# Patient Record
Sex: Male | Born: 1945 | Race: Black or African American | Hispanic: No | Smoking: Former smoker
Health system: Southern US, Community
[De-identification: ages and names within clinical notes are randomized; demographics above are authoritative.]

## PROBLEM LIST (undated history)

## (undated) DIAGNOSIS — I10 Essential (primary) hypertension: Secondary | ICD-10-CM

## (undated) DIAGNOSIS — Z992 Dependence on renal dialysis: Secondary | ICD-10-CM

## (undated) DIAGNOSIS — N189 Chronic kidney disease, unspecified: Secondary | ICD-10-CM

## (undated) DIAGNOSIS — A419 Sepsis, unspecified organism: Secondary | ICD-10-CM

## (undated) DIAGNOSIS — D631 Anemia in chronic kidney disease: Secondary | ICD-10-CM

## (undated) DIAGNOSIS — R6521 Severe sepsis with septic shock: Secondary | ICD-10-CM

## (undated) DIAGNOSIS — D649 Anemia, unspecified: Secondary | ICD-10-CM

## (undated) DIAGNOSIS — N186 End stage renal disease: Secondary | ICD-10-CM

## (undated) DIAGNOSIS — C801 Malignant (primary) neoplasm, unspecified: Secondary | ICD-10-CM

## (undated) HISTORY — DX: Anemia, unspecified: D64.9

## (undated) HISTORY — DX: Anemia in chronic kidney disease: D63.1

## (undated) HISTORY — DX: Chronic kidney disease, unspecified: N18.9

---

## 1987-07-14 HISTORY — PX: OTHER SURGICAL HISTORY: SHX169

## 1992-07-13 HISTORY — PX: SHOULDER SURGERY: SHX246

## 1996-07-13 HISTORY — PX: KNEE SURGERY: SHX244

## 2007-07-14 DIAGNOSIS — C801 Malignant (primary) neoplasm, unspecified: Secondary | ICD-10-CM

## 2007-07-14 HISTORY — DX: Malignant (primary) neoplasm, unspecified: C80.1

## 2007-09-19 ENCOUNTER — Ambulatory Visit: Admission: RE | Admit: 2007-09-19 | Discharge: 2007-12-18 | Payer: Self-pay | Admitting: Radiation Oncology

## 2008-07-24 ENCOUNTER — Emergency Department (HOSPITAL_COMMUNITY): Admission: EM | Admit: 2008-07-24 | Discharge: 2008-07-24 | Payer: Self-pay | Admitting: Emergency Medicine

## 2009-09-20 ENCOUNTER — Inpatient Hospital Stay (HOSPITAL_COMMUNITY): Admission: EM | Admit: 2009-09-20 | Discharge: 2009-09-22 | Payer: Self-pay | Admitting: Emergency Medicine

## 2010-08-02 ENCOUNTER — Emergency Department (HOSPITAL_COMMUNITY)
Admission: EM | Admit: 2010-08-02 | Discharge: 2010-08-02 | Payer: Self-pay | Source: Home / Self Care | Admitting: Emergency Medicine

## 2010-08-05 LAB — POCT I-STAT, CHEM 8
BUN: 22 mg/dL (ref 6–23)
Calcium, Ion: 1.19 mmol/L (ref 1.12–1.32)
Chloride: 109 mEq/L (ref 96–112)
Creatinine, Ser: 1.7 mg/dL — ABNORMAL HIGH (ref 0.4–1.5)
Glucose, Bld: 52 mg/dL — ABNORMAL LOW (ref 70–99)
HCT: 35 % — ABNORMAL LOW (ref 39.0–52.0)
Hemoglobin: 11.9 g/dL — ABNORMAL LOW (ref 13.0–17.0)
Potassium: 3.7 mEq/L (ref 3.5–5.1)
Sodium: 146 mEq/L — ABNORMAL HIGH (ref 135–145)
TCO2: 29 mmol/L (ref 0–100)

## 2010-08-05 LAB — URINE MICROSCOPIC-ADD ON

## 2010-08-05 LAB — URINALYSIS, ROUTINE W REFLEX MICROSCOPIC
Bilirubin Urine: NEGATIVE
Ketones, ur: NEGATIVE mg/dL
Leukocytes, UA: NEGATIVE
Nitrite: NEGATIVE
Protein, ur: >300 mg/dL — AB
Specific Gravity, Urine: 1.025 (ref 1.005–1.030)
Urine Glucose, Fasting: NEGATIVE mg/dL
Urobilinogen, UA: 0.2 mg/dL (ref 0.0–1.0)
pH: 6 (ref 5.0–8.0)

## 2010-08-05 LAB — GLUCOSE, CAPILLARY: Glucose-Capillary: 98 mg/dL (ref 70–99)

## 2010-10-06 LAB — URINALYSIS, ROUTINE W REFLEX MICROSCOPIC
Bilirubin Urine: NEGATIVE
Glucose, UA: NEGATIVE mg/dL
Ketones, ur: NEGATIVE mg/dL
Leukocytes, UA: NEGATIVE
Nitrite: NEGATIVE
Protein, ur: 30 mg/dL — AB
Specific Gravity, Urine: 1.02 (ref 1.005–1.030)
Urobilinogen, UA: 0.2 mg/dL (ref 0.0–1.0)
pH: 5.5 (ref 5.0–8.0)

## 2010-10-06 LAB — DIFFERENTIAL
Basophils Absolute: 0 10*3/uL (ref 0.0–0.1)
Basophils Absolute: 0 10*3/uL (ref 0.0–0.1)
Basophils Relative: 0 % (ref 0–1)
Basophils Relative: 0 % (ref 0–1)
Eosinophils Absolute: 0 10*3/uL (ref 0.0–0.7)
Eosinophils Absolute: 0.1 10*3/uL (ref 0.0–0.7)
Eosinophils Relative: 0 % (ref 0–5)
Eosinophils Relative: 2 % (ref 0–5)
Lymphocytes Relative: 12 % (ref 12–46)
Lymphocytes Relative: 12 % (ref 12–46)
Lymphs Abs: 0.7 10*3/uL (ref 0.7–4.0)
Lymphs Abs: 1 10*3/uL (ref 0.7–4.0)
Monocytes Absolute: 0.8 10*3/uL (ref 0.1–1.0)
Monocytes Absolute: 1 10*3/uL (ref 0.1–1.0)
Monocytes Relative: 17 % — ABNORMAL HIGH (ref 3–12)
Monocytes Relative: 9 % (ref 3–12)
Neutro Abs: 4.1 10*3/uL (ref 1.7–7.7)
Neutro Abs: 6.7 10*3/uL (ref 1.7–7.7)
Neutrophils Relative %: 69 % (ref 43–77)
Neutrophils Relative %: 79 % — ABNORMAL HIGH (ref 43–77)

## 2010-10-06 LAB — COMPREHENSIVE METABOLIC PANEL
ALT: 20 U/L (ref 0–53)
ALT: 24 U/L (ref 0–53)
AST: 23 U/L (ref 0–37)
AST: 24 U/L (ref 0–37)
Albumin: 2.7 g/dL — ABNORMAL LOW (ref 3.5–5.2)
Albumin: 2.9 g/dL — ABNORMAL LOW (ref 3.5–5.2)
Alkaline Phosphatase: 53 U/L (ref 39–117)
Alkaline Phosphatase: 61 U/L (ref 39–117)
BUN: 33 mg/dL — ABNORMAL HIGH (ref 6–23)
BUN: 70 mg/dL — ABNORMAL HIGH (ref 6–23)
CO2: 24 mEq/L (ref 19–32)
CO2: 24 mEq/L (ref 19–32)
Calcium: 8.5 mg/dL (ref 8.4–10.5)
Calcium: 8.8 mg/dL (ref 8.4–10.5)
Chloride: 108 mEq/L (ref 96–112)
Chloride: 109 mEq/L (ref 96–112)
Creatinine, Ser: 1.58 mg/dL — ABNORMAL HIGH (ref 0.4–1.5)
Creatinine, Ser: 3.28 mg/dL — ABNORMAL HIGH (ref 0.4–1.5)
GFR calc Af Amer: 23 mL/min — ABNORMAL LOW (ref >60)
GFR calc Af Amer: 54 mL/min — ABNORMAL LOW (ref >60)
GFR calc non Af Amer: 19 mL/min — ABNORMAL LOW (ref >60)
GFR calc non Af Amer: 44 mL/min — ABNORMAL LOW (ref >60)
Glucose, Bld: 216 mg/dL — ABNORMAL HIGH (ref 70–99)
Glucose, Bld: 259 mg/dL — ABNORMAL HIGH (ref 70–99)
Potassium: 3.7 mEq/L (ref 3.5–5.1)
Potassium: 3.8 mEq/L (ref 3.5–5.1)
Sodium: 140 mEq/L (ref 135–145)
Sodium: 140 mEq/L (ref 135–145)
Total Bilirubin: 0.4 mg/dL (ref 0.3–1.2)
Total Bilirubin: 0.4 mg/dL (ref 0.3–1.2)
Total Protein: 6.5 g/dL (ref 6.0–8.3)
Total Protein: 6.8 g/dL (ref 6.0–8.3)

## 2010-10-06 LAB — CBC
HCT: 31.1 % — ABNORMAL LOW (ref 39.0–52.0)
HCT: 33.7 % — ABNORMAL LOW (ref 39.0–52.0)
Hemoglobin: 10.6 g/dL — ABNORMAL LOW (ref 13.0–17.0)
Hemoglobin: 11.5 g/dL — ABNORMAL LOW (ref 13.0–17.0)
MCHC: 34.1 g/dL (ref 30.0–36.0)
MCHC: 34.1 g/dL (ref 30.0–36.0)
MCV: 89.9 fL (ref 78.0–100.0)
MCV: 90.6 fL (ref 78.0–100.0)
Platelets: 195 10*3/uL (ref 150–400)
Platelets: 196 10*3/uL (ref 150–400)
RBC: 3.46 MIL/uL — ABNORMAL LOW (ref 4.22–5.81)
RBC: 3.72 MIL/uL — ABNORMAL LOW (ref 4.22–5.81)
RDW: 14.5 % (ref 11.5–15.5)
RDW: 14.5 % (ref 11.5–15.5)
WBC: 5.9 10*3/uL (ref 4.0–10.5)
WBC: 8.5 10*3/uL (ref 4.0–10.5)

## 2010-10-06 LAB — BASIC METABOLIC PANEL
BUN: 52 mg/dL — ABNORMAL HIGH (ref 6–23)
CO2: 21 mEq/L (ref 19–32)
Calcium: 8.6 mg/dL (ref 8.4–10.5)
Chloride: 107 mEq/L (ref 96–112)
Creatinine, Ser: 2.19 mg/dL — ABNORMAL HIGH (ref 0.4–1.5)
GFR calc Af Amer: 37 mL/min — ABNORMAL LOW (ref >60)
GFR calc non Af Amer: 30 mL/min — ABNORMAL LOW (ref >60)
Glucose, Bld: 321 mg/dL — ABNORMAL HIGH (ref 70–99)
Potassium: 3.8 mEq/L (ref 3.5–5.1)
Sodium: 138 mEq/L (ref 135–145)

## 2010-10-06 LAB — RHEUMATOID FACTOR: Rhuematoid fact SerPl-aCnc: 20 IU/mL (ref 0–20)

## 2010-10-06 LAB — URINE MICROSCOPIC-ADD ON

## 2010-10-06 LAB — LIPID PANEL
Cholesterol: 104 mg/dL (ref 0–200)
HDL: 29 mg/dL — ABNORMAL LOW (ref >39)
LDL Cholesterol: 51 mg/dL (ref 0–99)
Total CHOL/HDL Ratio: 3.6 RATIO
Triglycerides: 119 mg/dL (ref <150)
VLDL: 24 mg/dL (ref 0–40)

## 2010-10-06 LAB — GLUCOSE, CAPILLARY
Glucose-Capillary: 173 mg/dL — ABNORMAL HIGH (ref 70–99)
Glucose-Capillary: 188 mg/dL — ABNORMAL HIGH (ref 70–99)
Glucose-Capillary: 205 mg/dL — ABNORMAL HIGH (ref 70–99)
Glucose-Capillary: 229 mg/dL — ABNORMAL HIGH (ref 70–99)
Glucose-Capillary: 273 mg/dL — ABNORMAL HIGH (ref 70–99)
Glucose-Capillary: 286 mg/dL — ABNORMAL HIGH (ref 70–99)
Glucose-Capillary: 301 mg/dL — ABNORMAL HIGH (ref 70–99)
Glucose-Capillary: 319 mg/dL — ABNORMAL HIGH (ref 70–99)
Glucose-Capillary: 319 mg/dL — ABNORMAL HIGH (ref 70–99)

## 2010-10-06 LAB — T4, FREE: Free T4: 1.18 ng/dL (ref 0.80–1.80)

## 2010-10-06 LAB — ANA: Anti Nuclear Antibody(ANA): POSITIVE — AB

## 2010-10-06 LAB — ANTI-NUCLEAR AB-TITER (ANA TITER): ANA Titer 1: NEGATIVE

## 2010-10-06 LAB — TSH
TSH: 0.199 u[IU]/mL — ABNORMAL LOW (ref 0.350–4.500)
TSH: 0.736 u[IU]/mL (ref 0.350–4.500)

## 2010-10-06 LAB — SEDIMENTATION RATE: Sed Rate: 40 mm/hr — ABNORMAL HIGH (ref 0–16)

## 2010-10-06 LAB — PHOSPHORUS: Phosphorus: 3.3 mg/dL (ref 2.3–4.6)

## 2010-10-06 LAB — MAGNESIUM: Magnesium: 2.1 mg/dL (ref 1.5–2.5)

## 2010-10-06 LAB — CK: Total CK: 119 U/L (ref 7–232)

## 2010-10-06 LAB — HEMOGLOBIN A1C
Hgb A1c MFr Bld: 6.8 % — ABNORMAL HIGH (ref 4.6–6.1)
Mean Plasma Glucose: 148 mg/dL

## 2011-07-10 ENCOUNTER — Emergency Department (HOSPITAL_COMMUNITY): Payer: Medicare Other

## 2011-07-10 ENCOUNTER — Inpatient Hospital Stay (HOSPITAL_COMMUNITY)
Admission: EM | Admit: 2011-07-10 | Discharge: 2011-07-11 | DRG: 125 | Disposition: A | Discharge status: Home / Self Care | Payer: Medicare Other | Attending: Internal Medicine | Admitting: Internal Medicine

## 2011-07-10 ENCOUNTER — Other Ambulatory Visit: Payer: Self-pay

## 2011-07-10 ENCOUNTER — Encounter: Payer: Self-pay | Admitting: Emergency Medicine

## 2011-07-10 DIAGNOSIS — Z8546 Personal history of malignant neoplasm of prostate: Secondary | ICD-10-CM

## 2011-07-10 DIAGNOSIS — H538 Other visual disturbances: Principal | ICD-10-CM | POA: Diagnosis present

## 2011-07-10 DIAGNOSIS — I10 Essential (primary) hypertension: Secondary | ICD-10-CM | POA: Diagnosis present

## 2011-07-10 DIAGNOSIS — N189 Chronic kidney disease, unspecified: Secondary | ICD-10-CM | POA: Diagnosis present

## 2011-07-10 DIAGNOSIS — M109 Gout, unspecified: Secondary | ICD-10-CM | POA: Diagnosis present

## 2011-07-10 DIAGNOSIS — E119 Type 2 diabetes mellitus without complications: Secondary | ICD-10-CM | POA: Diagnosis present

## 2011-07-10 DIAGNOSIS — I129 Hypertensive chronic kidney disease with stage 1 through stage 4 chronic kidney disease, or unspecified chronic kidney disease: Secondary | ICD-10-CM | POA: Diagnosis present

## 2011-07-10 DIAGNOSIS — I639 Cerebral infarction, unspecified: Secondary | ICD-10-CM

## 2011-07-10 DIAGNOSIS — Z794 Long term (current) use of insulin: Secondary | ICD-10-CM

## 2011-07-10 HISTORY — DX: Essential (primary) hypertension: I10

## 2011-07-10 HISTORY — DX: Malignant (primary) neoplasm, unspecified: C80.1

## 2011-07-10 LAB — DIFFERENTIAL
Basophils Absolute: 0.1 10*3/uL (ref 0.0–0.1)
Basophils Relative: 1 % (ref 0–1)
Eosinophils Absolute: 0.1 10*3/uL (ref 0.0–0.7)
Eosinophils Relative: 2 % (ref 0–5)
Lymphocytes Relative: 26 % (ref 12–46)
Lymphs Abs: 1.8 10*3/uL (ref 0.7–4.0)
Monocytes Absolute: 0.5 10*3/uL (ref 0.1–1.0)
Monocytes Relative: 7 % (ref 3–12)
Neutro Abs: 4.2 10*3/uL (ref 1.7–7.7)
Neutrophils Relative %: 64 % (ref 43–77)

## 2011-07-10 LAB — CBC
HCT: 33.5 % — ABNORMAL LOW (ref 39.0–52.0)
Hemoglobin: 10.6 g/dL — ABNORMAL LOW (ref 13.0–17.0)
MCH: 29.9 pg (ref 26.0–34.0)
MCHC: 31.6 g/dL (ref 30.0–36.0)
MCV: 94.4 fL (ref 78.0–100.0)
Platelets: 238 10*3/uL (ref 150–400)
RBC: 3.55 MIL/uL — ABNORMAL LOW (ref 4.22–5.81)
RDW: 13.1 % (ref 11.5–15.5)
WBC: 6.7 10*3/uL (ref 4.0–10.5)

## 2011-07-10 LAB — COMPREHENSIVE METABOLIC PANEL
ALT: 11 U/L (ref 0–53)
AST: 16 U/L (ref 0–37)
Albumin: 2.9 g/dL — ABNORMAL LOW (ref 3.5–5.2)
Alkaline Phosphatase: 89 U/L (ref 39–117)
BUN: 22 mg/dL (ref 6–23)
CO2: 29 mEq/L (ref 19–32)
Calcium: 9.4 mg/dL (ref 8.4–10.5)
Chloride: 106 mEq/L (ref 96–112)
Creatinine, Ser: 2.13 mg/dL — ABNORMAL HIGH (ref 0.50–1.35)
GFR calc Af Amer: 35 mL/min — ABNORMAL LOW (ref >90)
GFR calc non Af Amer: 30 mL/min — ABNORMAL LOW (ref >90)
Glucose, Bld: 150 mg/dL — ABNORMAL HIGH (ref 70–99)
Potassium: 3.7 mEq/L (ref 3.5–5.1)
Sodium: 142 mEq/L (ref 135–145)
Total Bilirubin: 0.2 mg/dL — ABNORMAL LOW (ref 0.3–1.2)
Total Protein: 7.1 g/dL (ref 6.0–8.3)

## 2011-07-10 LAB — GLUCOSE, CAPILLARY: Glucose-Capillary: 111 mg/dL — ABNORMAL HIGH (ref 70–99)

## 2011-07-10 MED ORDER — ALLOPURINOL 300 MG PO TABS
300.0000 mg | ORAL_TABLET | Freq: Every day | ORAL | Status: DC
  Administered 2011-07-11: 300 mg via ORAL
  Filled 2011-07-10 (×3): qty 1

## 2011-07-10 MED ORDER — INSULIN GLARGINE 100 UNIT/ML ~~LOC~~ SOLN
20.0000 [IU] | Freq: Every day | SUBCUTANEOUS | Status: DC
  Administered 2011-07-10: 20 [IU] via SUBCUTANEOUS
  Filled 2011-07-10: qty 3

## 2011-07-10 MED ORDER — INSULIN ASPART 100 UNIT/ML ~~LOC~~ SOLN: 0.0000 [IU] | Freq: Three times a day (TID) | SUBCUTANEOUS | Status: DC

## 2011-07-10 MED ORDER — ACETAMINOPHEN 325 MG PO TABS: 650.0000 mg | ORAL_TABLET | Freq: Four times a day (QID) | ORAL | Status: DC | PRN

## 2011-07-10 MED ORDER — ACETAMINOPHEN 650 MG RE SUPP: 650.0000 mg | Freq: Four times a day (QID) | RECTAL | Status: DC | PRN

## 2011-07-10 MED ORDER — ZOLPIDEM TARTRATE 5 MG PO TABS: 5.0000 mg | ORAL_TABLET | Freq: Every evening | ORAL | Status: DC | PRN

## 2011-07-10 MED ORDER — GLIPIZIDE 5 MG PO TABS
5.0000 mg | ORAL_TABLET | Freq: Two times a day (BID) | ORAL | Status: DC
  Administered 2011-07-11: 5 mg via ORAL
  Filled 2011-07-10 (×4): qty 1

## 2011-07-10 MED ORDER — LOSARTAN POTASSIUM 50 MG PO TABS
100.0000 mg | ORAL_TABLET | Freq: Every day | ORAL | Status: DC
  Administered 2011-07-11: 100 mg via ORAL
  Filled 2011-07-10 (×3): qty 2

## 2011-07-10 MED ORDER — ONDANSETRON HCL 4 MG PO TABS: 4.0000 mg | ORAL_TABLET | Freq: Four times a day (QID) | ORAL | Status: DC | PRN

## 2011-07-10 MED ORDER — DOXAZOSIN MESYLATE 8 MG PO TABS
8.0000 mg | ORAL_TABLET | Freq: Every day | ORAL | Status: DC
  Administered 2011-07-10: 8 mg via ORAL
  Filled 2011-07-10 (×3): qty 1

## 2011-07-10 MED ORDER — PANTOPRAZOLE SODIUM 40 MG PO TBEC
40.0000 mg | DELAYED_RELEASE_TABLET | Freq: Every day | ORAL | Status: DC
  Administered 2011-07-11: 40 mg via ORAL
  Filled 2011-07-10: qty 1

## 2011-07-10 MED ORDER — SODIUM CHLORIDE 0.9 % IV SOLN
Freq: Once | INTRAVENOUS | Status: AC
  Administered 2011-07-10: 17:00:00 via INTRAVENOUS

## 2011-07-10 MED ORDER — INSULIN ASPART 100 UNIT/ML ~~LOC~~ SOLN: 0.0000 [IU] | Freq: Every day | SUBCUTANEOUS | Status: DC

## 2011-07-10 MED ORDER — AMLODIPINE BESYLATE 5 MG PO TABS
10.0000 mg | ORAL_TABLET | Freq: Every day | ORAL | Status: DC
  Administered 2011-07-11: 10 mg via ORAL
  Filled 2011-07-10: qty 2

## 2011-07-10 MED ORDER — ONDANSETRON HCL 4 MG/2ML IJ SOLN: 4.0000 mg | Freq: Four times a day (QID) | INTRAMUSCULAR | Status: DC | PRN

## 2011-07-10 MED ORDER — CARVEDILOL 12.5 MG PO TABS
25.0000 mg | ORAL_TABLET | Freq: Two times a day (BID) | ORAL | Status: DC
  Administered 2011-07-11: 25 mg via ORAL
  Filled 2011-07-10 (×5): qty 2

## 2011-07-10 MED ORDER — POTASSIUM CHLORIDE CRYS ER 10 MEQ PO TBCR: 10.0000 meq | EXTENDED_RELEASE_TABLET | Freq: Once | ORAL | Status: DC

## 2011-07-10 MED ORDER — LORAZEPAM 2 MG/ML IJ SOLN
1.0000 mg | Freq: Once | INTRAMUSCULAR | Status: AC
  Administered 2011-07-10: 1 mg via INTRAVENOUS
  Filled 2011-07-10: qty 1

## 2011-07-10 NOTE — ED Notes (Signed)
Pt states that he cannot see out of his left eye. States that he woke up this am with blurred vision and spots in his left eye. Denies drainage or injury.

## 2011-07-10 NOTE — ED Notes (Signed)
Pt c/o left eye irritated and watery upon waking this am.

## 2011-07-10 NOTE — ED Notes (Signed)
Returned from MRI at [place], waiting to give report to the floor

## 2011-07-10 NOTE — H&P (Signed)
Justin [name] MRN: [id number] DOB/AGE: 09/20/1945 65 y.o.  Admit date: 07/10/2011 Chief Complaint: Blurred vision in the left eye. HPI: This 65 year old man woke up this morning at approximately 9 AM and had sudden onset of blurring of the left eye, progressing to blindness for a few hours. Now, the vision in the left eye is better, but still blurred. He did not have any speech difficulties or any limb weakness. His mentation has been clear and without confusion. He has a history of diabetes, hypertension and hyperlipidemia per his own admission. At    Past medical history: 1. Diabetes, on insulin. 2. Hypertension. 3. Gout. 4. History prostate cancer, with radiation seeds. 5. Chronic kidney disease.  Past surgical history: 1. Shoulder surgery. 2. Knee surgery.      Family history: Noncontributory.  Social history: He is a widower and lives alone. He does not smoke cigarettes and does not drink alcohol.  Allergies: No Known Allergies  Medications Prior to Admission  Medication Dose Route Frequency Provider Last Rate Last Dose  . 0.9 %  sodium chloride infusion   Intravenous Once Duaine Dredge, PA 20 mL/hr at 07/10/11 1700    . acetaminophen (TYLENOL) tablet 650 mg  650 mg Oral Q6H PRN Nimish C Gosrani       Or  . acetaminophen (TYLENOL) suppository 650 mg  650 mg Rectal Q6H PRN Nimish C Gosrani      . allopurinol (ZYLOPRIM) tablet 300 mg  300 mg Oral Daily Nimish C Gosrani      . amLODipine (NORVASC) tablet 10 mg  10 mg Oral Daily Nimish C Gosrani      . carvedilol (COREG) tablet 25 mg  25 mg Oral BID WC Nimish C Gosrani      . doxazosin (CARDURA) tablet 8 mg  8 mg Oral QHS Nimish C Gosrani      . glipiZIDE (GLUCOTROL) tablet 5 mg  5 mg Oral BID AC Nimish C Gosrani      . insulin aspart (novoLOG) injection 0-15 Units  0-15 Units Subcutaneous TID WC Nimish C Gosrani      . insulin aspart (novoLOG) injection 0-5 Units  0-5 Units Subcutaneous QHS Nimish C Gosrani      .  insulin glargine (LANTUS) injection 20 Units  20 Units Subcutaneous QHS Nimish C Gosrani      . LORazepam (ATIVAN) injection 1 mg  1 mg Intravenous Once Sharyon Cable, MD   1 mg at 07/10/11 1722  . losartan (COZAAR) tablet 100 mg  100 mg Oral Daily Nimish C Gosrani      . ondansetron (ZOFRAN) tablet 4 mg  4 mg Oral Q6H PRN Nimish C Gosrani       Or  . ondansetron (ZOFRAN) injection 4 mg  4 mg Intravenous Q6H PRN Nimish C Gosrani      . pantoprazole (PROTONIX) EC tablet 40 mg  40 mg Oral Q1200 Nimish C Gosrani      . potassium chloride SA (K-DUR,KLOR-CON) CR tablet 10 mEq  10 mEq Oral Once Nimish C Gosrani      . zolpidem (AMBIEN) tablet 5 mg  5 mg Oral QHS PRN Nimish C Gosrani       No current outpatient prescriptions on file as of 07/10/2011.       [id number] from the symptoms mentioned above,there are no other symptoms referable to all systems reviewed.  Physical Exam: Blood pressure 164/90, pulse 74, temperature 97.9 F (36.6 C), temperature source Oral, resp. rate  18, SpO2 100.00%. He looks systemically well. He is alert and orientated. There is no facial asymmetries. There is no limb weakness. External ocular movements of both eyes are normal. There is no nystagmus. Pupils are equal and reactive bilaterally. Funduscopy is difficult in the emergency room and his pupil is not dilated. However, there appears to be a red light reflex in both eyes. There is no carotid bruit. Heart sounds are present with a soft systolic murmur does not radiate to the carotids. Lung fields are clear. Abdomen is soft and nontender. There is no hepatosplenomegaly. There are no masses.    Basename 07/10/11 1550  WBC 6.7  NEUTROABS 4.2  HGB 10.6*  HCT 33.5*  MCV 94.4  PLT 238    Basename 07/10/11 1550  NA 142  K 3.7  CL 106  CO2 29  GLUCOSE 150*  BUN 22  CREATININE 2.13*  CALCIUM 9.4  MG --      No results found for this or any previous visit (from the past 240 hour(s)).   Ct Head Wo  Contrast  07/10/2011  *RADIOLOGY REPORT*  Clinical Data: Fusion changes, left eye.  CT HEAD WITHOUT CONTRAST  Technique:  Contiguous axial images were obtained from the base of the skull through the vertex without contrast.  Comparison: None.  Findings: Focal hypodensity within the right corona radiata. Periventricular and subcortical white matter hypodensities are most in keeping with chronic microangiopathic change. There is no evidence for acute hemorrhage, hydrocephalus, mass lesion, or abnormal extra-axial fluid collection. The visualized paranasal sinuses and mastoid air cells are predominately clear. Convex high attenuation superficial to the left occipital bone, may reflect prior trauma/hematoma.  The globes are symmetric.  The lenses are located.  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  Discussed via telephone with Dr. Christy Gentles at 04:25 p.m. on 07/10/2011.  Original Report Authenticated By: Suanne Marker, M.D.   Impression: 1. Blurred vision in the left eye, unclear etiology. 2. Hypertension. 3. Diabetes mellitus. 4. Chronic kidney disease.     Plan: 1. Admit to telemetry. 2. Agree with MRI of the brain. 3. Consider ophthalmological consultation.      Doree Albee Pager [phone]  07/10/2011, 5:37 PM

## 2011-07-10 NOTE — ED Provider Notes (Addendum)
Pt seen with PA Reports "curtain" over left eye He is unsure when this started No other complaints Could be CVA tPA in stroke considered but not given due to:  Onset over 3-4.5hours (unclear time of onset as he awoke with symptoms)  Pt has chronic ptosis in OS that is not new per patient     Sharyon Cable, MD 07/10/11 1555   Date: 07/10/2011  Rate: 66  Rhythm: normal sinus rhythm  QRS Axis: normal  Intervals: normal  ST/T Wave abnormalities: nonspecific ST changes  Conduction Disutrbances:none      Sharyon Cable, MD 07/10/11 1656

## 2011-07-10 NOTE — ED Provider Notes (Signed)
History     CSN: [id number]  Arrival date & time 07/10/11  1207   First MD Initiated Contact with Patient 07/10/11 1422      Chief Complaint  Patient presents with  . Eye Problem    (Consider location/radiation/quality/duration/timing/severity/associated sxs/prior treatment) HPI Comments: Pt awakened ~ 0530 today noticing a difference in the vision in his L eye.  Denies pain or trauma.  Denies any other neuro sxs.  Patient is a 65 y.o. male presenting with eye problem. The history is provided by the patient. No language interpreter was used.  Eye Problem  This is a new problem. The current episode started 6 to 12 hours ago. The problem occurs constantly. The problem has not changed since onset.There is pain in the left eye. There was no injury mechanism. Associated symptoms include eye redness. Pertinent negatives include no discharge and no photophobia.    Past Medical History  Diagnosis Date  . Diabetes mellitus   . Hypertension   . Gout     Past Surgical History  Procedure Date  . Shoulder surgery   . Knee surgery     History reviewed. No pertinent family history.  History  Substance Use Topics  . Smoking status: Never Smoker   . Smokeless tobacco: Not on file  . Alcohol Use: No      Review of Systems  Eyes: Positive for redness and visual disturbance. Negative for photophobia, pain, discharge and itching.  Respiratory: Negative for cough and shortness of breath.   Cardiovascular: Negative for chest pain.  Genitourinary: Negative for dysuria, frequency and enuresis.  All other systems reviewed and are negative.    Allergies  Review of patient's allergies indicates no known allergies.  Home Medications  No current outpatient prescriptions on file.  BP 164/90  Pulse 74  Temp(Src) 98.2 F (36.8 C) (Oral)  Resp 18  SpO2 100%  Physical Exam  Nursing note and vitals reviewed. Constitutional: He is oriented to person, place, and time. He appears  well-developed and well-nourished. No distress.  HENT:  Head: Normocephalic and atraumatic.  Right Ear: External ear normal.  Left Ear: External ear normal.  Nose: Nose normal.  Mouth/Throat: Oropharynx is clear and moist. No oropharyngeal exudate.  Eyes: EOM are normal. Pupils are equal, round, and reactive to light. Right eye exhibits no discharge, no exudate and no hordeolum. Left eye exhibits no discharge, no exudate and no hordeolum. Right conjunctiva is injected. Right conjunctiva has no hemorrhage. Left conjunctiva is injected. Left conjunctiva has no hemorrhage. Scleral icterus is present. Right eye exhibits normal extraocular motion and no nystagmus. Left eye exhibits normal extraocular motion and no nystagmus.       i was unable to perform an adequate exam on the pt fundoscopically  Neck: Normal range of motion.  Cardiovascular: Normal rate, regular rhythm, normal heart sounds and intact distal pulses.   Pulmonary/Chest: Effort normal and breath sounds normal. No respiratory distress.  Abdominal: Soft. He exhibits no distension. There is no tenderness.  Musculoskeletal: Normal range of motion.  Neurological: He is alert and oriented to person, place, and time. He has normal strength. He displays no tremor. No cranial nerve deficit or sensory deficit. He displays no seizure activity. GCS eye subscore is 4. GCS verbal subscore is 5. GCS motor subscore is 6.  Skin: Skin is warm and dry. He is not diaphoretic.  Psychiatric: He has a normal mood and affect. Judgment normal.    ED Course  Procedures (including critical care  time)  Labs Reviewed - No data to display No results found.   No diagnosis found.    [place], [place] 07/24/11 [phone]

## 2011-07-11 LAB — CBC
HCT: 32.7 % — ABNORMAL LOW (ref 39.0–52.0)
Hemoglobin: 10.4 g/dL — ABNORMAL LOW (ref 13.0–17.0)
MCH: 30.5 pg (ref 26.0–34.0)
MCHC: 31.8 g/dL (ref 30.0–36.0)
MCV: 95.9 fL (ref 78.0–100.0)
Platelets: 241 10*3/uL (ref 150–400)
RBC: 3.41 MIL/uL — ABNORMAL LOW (ref 4.22–5.81)
RDW: 13.2 % (ref 11.5–15.5)
WBC: 5.4 10*3/uL (ref 4.0–10.5)

## 2011-07-11 LAB — HEMOGLOBIN A1C
Hgb A1c MFr Bld: 5.9 % — ABNORMAL HIGH (ref <5.7)
Mean Plasma Glucose: 123 mg/dL — ABNORMAL HIGH (ref <117)

## 2011-07-11 LAB — COMPREHENSIVE METABOLIC PANEL
ALT: 11 U/L (ref 0–53)
AST: 14 U/L (ref 0–37)
Albumin: 2.4 g/dL — ABNORMAL LOW (ref 3.5–5.2)
Alkaline Phosphatase: 77 U/L (ref 39–117)
BUN: 19 mg/dL (ref 6–23)
CO2: 30 mEq/L (ref 19–32)
Calcium: 9.2 mg/dL (ref 8.4–10.5)
Chloride: 106 mEq/L (ref 96–112)
Creatinine, Ser: 2.12 mg/dL — ABNORMAL HIGH (ref 0.50–1.35)
GFR calc Af Amer: 35 mL/min — ABNORMAL LOW (ref >90)
GFR calc non Af Amer: 31 mL/min — ABNORMAL LOW (ref >90)
Glucose, Bld: 125 mg/dL — ABNORMAL HIGH (ref 70–99)
Potassium: 3.8 mEq/L (ref 3.5–5.1)
Sodium: 141 mEq/L (ref 135–145)
Total Bilirubin: 0.2 mg/dL — ABNORMAL LOW (ref 0.3–1.2)
Total Protein: 6.4 g/dL (ref 6.0–8.3)

## 2011-07-11 LAB — GLUCOSE, CAPILLARY
Glucose-Capillary: 118 mg/dL — ABNORMAL HIGH (ref 70–99)
Glucose-Capillary: 53 mg/dL — ABNORMAL LOW (ref 70–99)
Glucose-Capillary: 81 mg/dL (ref 70–99)
Glucose-Capillary: 87 mg/dL (ref 70–99)

## 2011-07-11 NOTE — Discharge Summary (Signed)
Physician Discharge Summary  Patient ID: Justin [name] MRN: [id number] DOB/AGE: January 26, 1946 65 y.o.  Admit date: 07/10/2011 Discharge date: 07/11/2011    Discharge Diagnoses:  1. Blurred vision, left eye, unclear etiology. No evidence of acute CVA on MRI. 2. Hypertension. 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 4. Chronic kidney disease.   Current Discharge Medication List    CONTINUE these medications which have NOT CHANGED   Details  allopurinol (ZYLOPRIM) 300 MG tablet Take 300 mg by mouth daily.      amLODipine (NORVASC) 10 MG tablet Take 10 mg by mouth daily.      carvedilol (COREG) 25 MG tablet Take 25 mg by mouth 2 (two) times daily with a meal.      doxazosin (CARDURA) 8 MG tablet Take 8 mg by mouth at bedtime.      glipiZIDE (GLUCOTROL) 5 MG tablet Take 5 mg by mouth 2 (two) times daily before a meal.      insulin glargine (LANTUS) 100 UNIT/ML injection Inject 20 Units into the skin at bedtime.      losartan (COZAAR) 100 MG tablet Take 100 mg by mouth daily.      omeprazole (PRILOSEC) 20 MG capsule Take 20 mg by mouth daily.      potassium chloride (KLOR-CON) 10 MEQ CR tablet Take 10 mEq by mouth daily.        STOP taking these medications     ketorolac (TORADOL) 10 MG tablet         Discharged Condition: Stable.    Consults: None.  Significant Diagnostic Studies: Ct Head Wo Contrast  07/10/2011  *RADIOLOGY REPORT*  Clinical Data: Fusion changes, left eye.  CT HEAD WITHOUT CONTRAST  Technique:  Contiguous axial images were obtained from the base of the skull through the vertex without contrast.  Comparison: None.  Findings: Focal hypodensity within the right corona radiata. Periventricular and subcortical white matter hypodensities are most in keeping with chronic microangiopathic change. There is no evidence for acute hemorrhage, hydrocephalus, mass lesion, or abnormal extra-axial fluid collection. The visualized paranasal  sinuses and mastoid air cells are predominately clear. Convex high attenuation superficial to the left occipital bone, may reflect prior trauma/hematoma.  The globes are symmetric.  The lenses are located.  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  Discussed via telephone with Dr. Christy Gentles at 04:25 p.m. on 07/10/2011.  Original Report Authenticated By: Suanne Marker, M.D.   [name] Contrast  07/10/2011  *RADIOLOGY REPORT*  Clinical Data: Blurred vision left eye.  Abnormal head CT.  MRI HEAD WITHOUT CONTRAST  Technique:  Multiplanar, multiecho pulse sequences of the brain and surrounding structures were obtained according to standard protocol without intravenous contrast.  Comparison: Head CT same day  Findings: The brainstem and cerebellum are unremarkable.  Within the cerebral hemispheres, there are areas of abnormal T2 and FLAIR signal within the hemispheric deep white matter consistent with old small vessel infarctions.  The low density seen in the deep white matter on the right on the CT scan represents an old white matter infarction.  Diffusion imaging does not show any acute or subacute infarction.  No evidence of mass lesion, hemorrhage, hydrocephalus or extra-axial collection.  No pituitary mass.  No inflammatory sinus disease of significance.  No skull base lesion.  IMPRESSION: No acute finding.  Numerous foci of abnormal signal within the hemispheric white matter consistent with chronic small vessel disease.  The differential diagnosis does include chronic demyelinating disease but that is felt less likely.  Original Report Authenticated By: Jules Schick, M.D.    Lab Results: Basic Metabolic Panel:  Basename 07/11/11 0629 07/10/11 1550  NA 141 142  K 3.8 3.7  CL 106 106  CO2 30 29  GLUCOSE 125* 150*  BUN 19 22  CREATININE 2.12* 2.13*  CALCIUM 9.2 9.4  MG -- --  PHOS -- --   Liver Function  Tests:  [facility] 07/11/11 0629 07/10/11 1550  AST 14 16  ALT 11 11  ALKPHOS 77 89  BILITOT 0.2* 0.2*  PROT 6.4 7.1  ALBUMIN 2.4* 2.9*     CBC:  Basename 07/11/11 0629 07/10/11 1550  WBC 5.4 6.7  NEUTROABS -- 4.2  HGB 10.4* 10.6*  HCT 32.7* 33.5*  MCV 95.9 94.4  PLT 241 238       Hospital Course: This 65 year old man presented to the hospital yesterday with sudden onset of blurring in his left eye. He then apparently became blind in that eye for a few hours and then recovered to the point where he now has blurred vision only. He has no facial or limb weakness. MRI of the brain was done which did not show any acute CVA. I've discussed this with Dr. Leslie Andrea, ophthalmologist, who has agreed to see this patient in his office on Monday, 07/13/2011. His vision today he continues to be blurred but there is no other worsening.  Discharge Exam: Blood pressure 179/90, pulse 67, temperature 98.1 F (36.7 C), temperature source Oral, resp. rate 20, height 5\' 9"  (1.753 m), weight 115.9 kg (255 lb 8.2 oz), SpO2 97.00%. He looks systemically well. Heart sounds are present and normal. Lung fields are clear. There are no focal neurological signs. As far as I examination is concerned external ocular movements are normal, pupils are equal and reactive to light, he appears to have a red light reflex.  Disposition: Home. He will call Dr. Leslie Andrea as directed.  Discharge Orders    Future Orders Please Complete By Expires   Diet - low sodium heart healthy      Increase activity slowly         Follow-up Information    Follow up with [facility] (call [phone] on Mon 07/13/11)    Contact information:   [facility] [address], [place] [phone]          SignedDoree Albee Pager [phone]  07/11/2011, 11:03 AM

## 2011-07-11 NOTE — Discharge Planning (Signed)
The patient was discharged to home with follow-up appt with Dr. Leslie Andrea. He is ambulatory and alert and in stable condition. Reviewed his medications and Diabetic care.

## 2011-07-25 NOTE — ED Provider Notes (Signed)
Medical screening examination/treatment/procedure(s) were conducted as a shared visit with non-physician practitioner(s) and myself.  I personally evaluated the patient during the encounter   Sharyon Cable, MD 07/25/11 [phone]

## 2012-11-19 ENCOUNTER — Encounter (HOSPITAL_COMMUNITY): Payer: Self-pay | Admitting: *Deleted

## 2012-11-19 ENCOUNTER — Emergency Department (HOSPITAL_COMMUNITY): Payer: Medicare Other

## 2012-11-19 ENCOUNTER — Emergency Department (HOSPITAL_COMMUNITY)
Admission: EM | Admit: 2012-11-19 | Discharge: 2012-11-19 | Disposition: A | Discharge status: Home / Self Care | Payer: Medicare Other | Attending: Emergency Medicine | Admitting: Emergency Medicine

## 2012-11-19 DIAGNOSIS — M25552 Pain in left hip: Secondary | ICD-10-CM

## 2012-11-19 DIAGNOSIS — M25559 Pain in unspecified hip: Secondary | ICD-10-CM | POA: Insufficient documentation

## 2012-11-19 DIAGNOSIS — Z8546 Personal history of malignant neoplasm of prostate: Secondary | ICD-10-CM | POA: Insufficient documentation

## 2012-11-19 DIAGNOSIS — Z79899 Other long term (current) drug therapy: Secondary | ICD-10-CM | POA: Insufficient documentation

## 2012-11-19 DIAGNOSIS — M109 Gout, unspecified: Secondary | ICD-10-CM | POA: Insufficient documentation

## 2012-11-19 DIAGNOSIS — E119 Type 2 diabetes mellitus without complications: Secondary | ICD-10-CM | POA: Insufficient documentation

## 2012-11-19 DIAGNOSIS — I10 Essential (primary) hypertension: Secondary | ICD-10-CM | POA: Insufficient documentation

## 2012-11-19 DIAGNOSIS — Z794 Long term (current) use of insulin: Secondary | ICD-10-CM | POA: Insufficient documentation

## 2012-11-19 MED ORDER — OXYCODONE-ACETAMINOPHEN 5-325 MG PO TABS
ORAL_TABLET | ORAL | Status: AC
  Filled 2012-11-19: qty 1

## 2012-11-19 MED ORDER — HYDROCODONE-ACETAMINOPHEN 5-325 MG PO TABS: 1.0000 | ORAL_TABLET | Freq: Four times a day (QID) | ORAL | Status: DC | PRN

## 2012-11-19 MED ORDER — OXYCODONE-ACETAMINOPHEN 5-325 MG PO TABS
1.0000 | ORAL_TABLET | Freq: Once | ORAL | Status: AC
  Administered 2012-11-19: 1 via ORAL

## 2012-11-19 NOTE — ED Provider Notes (Signed)
History     CSN: [id number]  Arrival date & time 11/19/12  1632   First MD Initiated Contact with Patient 11/19/12 1732      Chief Complaint  Patient presents with  . Flank Pain    (Consider location/radiation/quality/duration/timing/severity/associated sxs/prior treatment) Patient is a 67 y.o. male presenting with flank pain. The history is provided by the patient (pt complains of left hip pain). No language interpreter was used.  Flank Pain This is a new problem. The current episode started 2 days ago. The problem occurs constantly. The problem has not changed since onset.Pertinent negatives include no chest pain, no abdominal pain and no headaches. Nothing aggravates the symptoms. Nothing relieves the symptoms.    Past Medical History  Diagnosis Date  . Diabetes mellitus   . Hypertension   . Gout   . Cancer 2009    Prostate; radiation seeds    Past Surgical History  Procedure Laterality Date  . Shoulder surgery    . Knee surgery      No family history on file.  History  Substance Use Topics  . Smoking status: Never Smoker   . Smokeless tobacco: Not on file  . Alcohol Use: No      Review of Systems  Constitutional: Negative for appetite change and fatigue.  HENT: Negative for congestion, sinus pressure and ear discharge.   Eyes: Negative for discharge.  Respiratory: Negative for cough.   Cardiovascular: Negative for chest pain.  Gastrointestinal: Negative for abdominal pain and diarrhea.  Genitourinary: Positive for flank pain. Negative for frequency and hematuria.  Musculoskeletal: Negative for back pain.  Skin: Negative for rash.  Neurological: Negative for seizures and headaches.  Psychiatric/Behavioral: Negative for hallucinations.    Allergies  Review of patient's allergies indicates no known allergies.  Home Medications   Current Outpatient Rx  Name  Route  Sig  Dispense  Refill  . allopurinol (ZYLOPRIM) 100 MG tablet   Oral   Take 100 mg  by mouth daily.         Marland Kitchen amLODipine (NORVASC) 10 MG tablet   Oral   Take 10 mg by mouth daily.           . carvedilol (COREG) 25 MG tablet   Oral   Take 25 mg by mouth 2 (two) times daily with a meal.           . doxazosin (CARDURA) 8 MG tablet   Oral   Take 8 mg by mouth at bedtime.           Marland Kitchen glipiZIDE (GLUCOTROL) 5 MG tablet   Oral   Take 5 mg by mouth 2 (two) times daily before a meal.           . insulin glargine (LANTUS) 100 UNIT/ML injection   Subcutaneous   Inject 5 Units into the skin at bedtime.          Marland Kitchen losartan (COZAAR) 100 MG tablet   Oral   Take 100 mg by mouth daily.           Marland Kitchen omeprazole (PRILOSEC) 20 MG capsule   Oral   Take 20 mg by mouth daily.           . potassium chloride (KLOR-CON) 10 MEQ CR tablet   Oral   Take 10 mEq by mouth daily.           Marland Kitchen HYDROcodone-acetaminophen (NORCO/VICODIN) 5-325 MG per tablet   Oral   Take 1 tablet by  mouth every 6 (six) hours as needed for pain.   30 tablet   0     BP 142/75  Pulse 66  Temp(Src) 98.4 F (36.9 C) (Oral)  Resp 16  Ht 5\' 10"  (1.778 m)  Wt 250 lb (113.399 kg)  BMI 35.87 kg/m2  SpO2 100%  Physical Exam  Constitutional: He is oriented to person, place, and time. He appears well-developed.  HENT:  Head: Normocephalic.  Eyes: Conjunctivae and EOM are normal. No scleral icterus.  Neck: Neck supple. No thyromegaly present.  Cardiovascular: Normal rate and regular rhythm.  Exam reveals no gallop and no friction rub.   No murmur heard. Pulmonary/Chest: No stridor. He has no wheezes. He has no rales. He exhibits no tenderness.  Abdominal: He exhibits no distension. There is no tenderness. There is no rebound.  Musculoskeletal: He exhibits no edema.  Tender ant left hip.  Pain with flexion.  neurovasc normal.  Pt able to ambulate with mild pain  Lymphadenopathy:    He has no cervical adenopathy.  Neurological: He is oriented to person, place, and time. Coordination  normal.  Skin: No rash noted. No erythema.  Psychiatric: He has a normal mood and affect. His behavior is normal.    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 SI joints symmetric. Minimal pelvic enthesopathy. No acute fracture, dislocation, or bone destruction.  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 No acute abnormalities.   Original Report Authenticated By: Lavonia Dana, M.D.      1. Hip pain, left       MDM          Maudry Diego, MD 11/19/12 1919

## 2012-11-19 NOTE — ED Notes (Signed)
Pt states left side pain ("not where the bone is but in my left side") and left hip pain since Tuesday.

## 2014-06-19 ENCOUNTER — Emergency Department (HOSPITAL_COMMUNITY): Payer: Medicare Other

## 2014-06-19 ENCOUNTER — Emergency Department (HOSPITAL_COMMUNITY)
Admission: EM | Admit: 2014-06-19 | Discharge: 2014-06-19 | Disposition: A | Discharge status: Home / Self Care | Payer: Medicare Other | Attending: Emergency Medicine | Admitting: Emergency Medicine

## 2014-06-19 ENCOUNTER — Encounter (HOSPITAL_COMMUNITY): Payer: Self-pay | Admitting: *Deleted

## 2014-06-19 DIAGNOSIS — Y9389 Activity, other specified: Secondary | ICD-10-CM | POA: Insufficient documentation

## 2014-06-19 DIAGNOSIS — Z923 Personal history of irradiation: Secondary | ICD-10-CM | POA: Diagnosis not present

## 2014-06-19 DIAGNOSIS — M7061 Trochanteric bursitis, right hip: Secondary | ICD-10-CM | POA: Diagnosis not present

## 2014-06-19 DIAGNOSIS — M25551 Pain in right hip: Secondary | ICD-10-CM | POA: Diagnosis present

## 2014-06-19 DIAGNOSIS — Z79899 Other long term (current) drug therapy: Secondary | ICD-10-CM | POA: Diagnosis not present

## 2014-06-19 DIAGNOSIS — Z8546 Personal history of malignant neoplasm of prostate: Secondary | ICD-10-CM | POA: Insufficient documentation

## 2014-06-19 DIAGNOSIS — I10 Essential (primary) hypertension: Secondary | ICD-10-CM | POA: Diagnosis not present

## 2014-06-19 DIAGNOSIS — M109 Gout, unspecified: Secondary | ICD-10-CM | POA: Insufficient documentation

## 2014-06-19 DIAGNOSIS — E119 Type 2 diabetes mellitus without complications: Secondary | ICD-10-CM | POA: Insufficient documentation

## 2014-06-19 LAB — CBG MONITORING, ED: Glucose-Capillary: 122 mg/dL — ABNORMAL HIGH (ref 70–99)

## 2014-06-19 MED ORDER — CYCLOBENZAPRINE HCL 10 MG PO TABS
10.0000 mg | ORAL_TABLET | Freq: Once | ORAL | Status: AC
  Administered 2014-06-19: 10 mg via ORAL
  Filled 2014-06-19: qty 1

## 2014-06-19 MED ORDER — PREDNISONE 20 MG PO TABS: ORAL_TABLET | ORAL | Status: DC

## 2014-06-19 MED ORDER — METHYLPREDNISOLONE SODIUM SUCC 125 MG IJ SOLR
125.0000 mg | Freq: Once | INTRAMUSCULAR | Status: AC
  Administered 2014-06-19: 125 mg via INTRAMUSCULAR
  Filled 2014-06-19: qty 2

## 2014-06-19 MED ORDER — TRAMADOL HCL 50 MG PO TABS
100.0000 mg | ORAL_TABLET | Freq: Once | ORAL | Status: AC
  Administered 2014-06-19: 100 mg via ORAL
  Filled 2014-06-19: qty 2

## 2014-06-19 MED ORDER — TRAMADOL HCL 50 MG PO TABS: 100.0000 mg | ORAL_TABLET | Freq: Four times a day (QID) | ORAL | Status: DC | PRN

## 2014-06-19 MED ORDER — CYCLOBENZAPRINE HCL 5 MG PO TABS: 5.0000 mg | ORAL_TABLET | Freq: Three times a day (TID) | ORAL | Status: DC | PRN

## 2014-06-19 MED ORDER — ACETAMINOPHEN 325 MG PO TABS
650.0000 mg | ORAL_TABLET | Freq: Once | ORAL | Status: AC
  Administered 2014-06-19: 650 mg via ORAL
  Filled 2014-06-19: qty 2

## 2014-06-19 NOTE — ED Notes (Signed)
Pt reporting pain in right hip for past 3 days.  Reports pain worse this morning.  Denies injury.

## 2014-06-19 NOTE — Discharge Instructions (Signed)
Use ice and heat over the painful area. Take the medications as prescribed. You can be rechecked by Dr Aline Brochure, the orthopedist on call, if you continue to have pain.

## 2014-06-19 NOTE — ED Provider Notes (Signed)
CSN: [id number]     Arrival date & time 06/19/14  0508 History   First MD Initiated Contact with Patient 06/19/14 [phone]     Chief Complaint  Patient presents with  . Hip Pain     (Consider location/radiation/quality/duration/timing/severity/associated sxs/prior Treatment) HPI  Patient reports about 3 days ago he started getting pain in his right hip. He denies any change in his activity before that. He denies any type of trauma including slip or falling. He states he's never had it before. He describes the pain as a muscle spasm pain. He states if he lays flat the pain is constant. However if he stands up for a while or walks around the pain goes away. He also states changing positions makes the pain worse. He states the pain radiates down his right leg along the lateral aspect of the lateral knee. He also gets numbness in the same area when he gets the pain.  PCP Dr Lunette Stands in [place], [place]  Past Medical History  Diagnosis Date  . Diabetes mellitus   . Hypertension   . Gout   . 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 Shoulder surgery    . Knee surgery     History reviewed. No pertinent family history. History  Substance Use Topics  . Smoking status: Never Smoker   . Smokeless tobacco: Not on file  . Alcohol Use: No  lives at home Lives with spouse Used to drive trucks and tour buses  Review of Systems  All other systems reviewed and are negative.     Allergies  Review of patient's allergies indicates no known allergies.  Home Medications   Prior to Admission medications   Medication Sig Start Date End Date Taking? Authorizing Provider  allopurinol (ZYLOPRIM) 100 MG tablet Take 100 mg by mouth daily.    Historical Provider, MD  amLODipine (NORVASC) 10 MG tablet Take 10 mg by mouth daily.      Historical Provider, MD  carvedilol (COREG) 25 MG tablet Take 25 mg by mouth 2 (two) times daily with a meal.       Historical Provider, MD  doxazosin (CARDURA) 8 MG tablet Take 8 mg by mouth at bedtime.      Historical Provider, MD  glipiZIDE (GLUCOTROL) 5 MG tablet Take 5 mg by mouth 2 (two) times daily before a meal.      Historical Provider, MD  HYDROcodone-acetaminophen (NORCO/VICODIN) 5-325 MG per tablet Take 1 tablet by mouth every 6 (six) hours as needed for pain. 11/19/12   Maudry Diego, MD  insulin glargine (LANTUS) 100 UNIT/ML injection Inject 5 Units into the skin at bedtime.     Historical Provider, MD  losartan (COZAAR) 100 MG tablet Take 100 mg by mouth daily.      Historical Provider, MD  omeprazole (PRILOSEC) 20 MG capsule Take 20 mg by mouth daily.      Historical Provider, MD  potassium chloride (KLOR-CON) 10 MEQ CR tablet Take 10 mEq by mouth daily.      Historical Provider, MD   BP 155/96 mmHg  Pulse 76  Temp(Src) 98 F (36.7 C) (Oral)  Resp 20  Ht 5\' 10"  (1.778 m)  Wt 270 lb (122.471 kg)  BMI 38.74 kg/m2  SpO2 98%   Vital signs normal  Except for tachycardia  Physical Exam  Constitutional: He is oriented to person, place, and time. He appears well-developed and well-nourished.  Non-toxic  appearance. He does not appear ill. No distress.  HENT:  Head: Normocephalic and atraumatic.  Right Ear: External ear normal.  Left Ear: External ear normal.  Nose: Nose normal. No mucosal edema or rhinorrhea.  Mouth/Throat: Oropharynx is clear and moist and mucous membranes are normal. No dental abscesses or uvula swelling.  Eyes: Conjunctivae and EOM are normal. Pupils are equal, round, and reactive to light.  Neck: Normal range of motion and full passive range of motion without pain. Neck supple.  Cardiovascular: Normal rate, regular rhythm and normal heart sounds.  Exam reveals no gallop and no friction rub.   No murmur heard. Pulmonary/Chest: Effort normal and breath sounds normal. No respiratory distress. He has no wheezes. He has no rhonchi. He has no rales. He exhibits no tenderness  and no crepitus.  Abdominal: Soft. Normal appearance and bowel sounds are normal. He exhibits no distension. There is no tenderness. There is no rebound and no guarding.  Musculoskeletal: Normal range of motion. He exhibits no edema or tenderness.       Legs: Moves all extremities well. Patient has no pain with straight leg raising. Patellar reflexes are 2+ and equal. Patient's spine including thoracic and lumbar spine is nontender to palpation. His SI joints are nontender to palpation he states his pain is more lateral. He does not have pain in the true hip joint. His pain appears to be over the greater trochanter.  Neurological: He is alert and oriented to person, place, and time. He has normal strength. No cranial nerve deficit.  Skin: Skin is warm, dry and intact. No rash noted. No erythema. No pallor.  Psychiatric: He has a normal mood and affect. His speech is normal and behavior is normal. His mood appears not anxious.  Nursing note and vitals reviewed.   ED Course  Procedures (including critical care time)  Medications  methylPREDNISolone sodium succinate (SOLU-MEDROL) 125 mg/2 mL injection 125 mg (125 mg Intramuscular Given 06/19/14 0613)  cyclobenzaprine (FLEXERIL) tablet 10 mg (10 mg Oral Given 06/19/14 0613)  traMADol (ULTRAM) tablet 100 mg (100 mg Oral Given 06/19/14 0652)  acetaminophen (TYLENOL) tablet 650 mg (650 mg Oral Given 06/19/14 [id number])   Patient states she has stage III renal disease. He was not given Toradol. He is diabetic and his CBG was checked. He was given Solu-Medrol for his anti-inflammatory effect which should help with his pain.   Labs Review Results for orders placed or performed during the hospital encounter of 06/19/14  POC CBG, ED  Result Value Ref Range   Glucose-Capillary 122 (H) 70 - 99 mg/dL   Laboratory interpretation all normal except hyperglycemia   Imaging Review Dg Hip Complete Right  06/19/2014   CLINICAL DATA:  Right hip pain for 3 days.   No known injury.  EXAM: RIGHT HIP - COMPLETE 2+ VIEW  COMPARISON:  None.  FINDINGS: Degenerative changes in the lower lumbar spine and hips. Pelvis and hips appear otherwise intact. No evidence of acute fracture or dislocation of the right hip. Small ununited ossicles adjacent to the right superior acetabulum, likely degenerative.  IMPRESSION: Degenerative changes in the right hip. No acute displaced fractures identified.   Electronically Signed   By: Lucienne Capers M.D.   On: 06/19/2014 06:10     EKG Interpretation None      MDM   Final diagnoses:  Trochanteric bursitis of right hip    Discharge Medication List as of 06/19/2014  6:30 AM    START taking these medications  Details  cyclobenzaprine (FLEXERIL) 5 MG tablet Take 1 tablet (5 mg total) by mouth 3 (three) times daily as needed., Starting 06/19/2014, Until Discontinued, Print    predniSONE (DELTASONE) 20 MG tablet Take 2 po QD x 3d then 1 po QD x 4d, Print    traMADol (ULTRAM) 50 MG tablet Take 2 tablets (100 mg total) by mouth every 6 (six) hours as needed., Starting 06/19/2014, Until Discontinued, Print        Plan discharge  Rolland Porter, MD, Alanson Aly, MD 06/19/14 [phone]

## 2015-02-28 ENCOUNTER — Encounter
Admission: RE | Admit: 2015-02-28 | Discharge: 2015-02-28 | Disposition: A | Discharge status: Home / Self Care | Payer: Medicare Other | Source: Ambulatory Visit | Attending: Vascular Surgery | Admitting: Vascular Surgery

## 2015-02-28 ENCOUNTER — Ambulatory Visit
Admission: RE | Admit: 2015-02-28 | Discharge: 2015-02-28 | Disposition: A | Discharge status: Home / Self Care | Payer: Medicare Other | Source: Ambulatory Visit | Attending: Vascular Surgery | Admitting: Vascular Surgery

## 2015-02-28 DIAGNOSIS — E119 Type 2 diabetes mellitus without complications: Secondary | ICD-10-CM

## 2015-02-28 DIAGNOSIS — Z01810 Encounter for preprocedural cardiovascular examination: Secondary | ICD-10-CM | POA: Insufficient documentation

## 2015-02-28 DIAGNOSIS — I10 Essential (primary) hypertension: Principal | ICD-10-CM

## 2015-02-28 DIAGNOSIS — Z01812 Encounter for preprocedural laboratory examination: Secondary | ICD-10-CM | POA: Insufficient documentation

## 2015-02-28 HISTORY — DX: Chronic kidney disease, unspecified: N18.9

## 2015-02-28 LAB — TYPE AND SCREEN
ABO/RH(D): O POS
Antibody Screen: NEGATIVE

## 2015-02-28 LAB — CBC
HCT: 35 % — ABNORMAL LOW (ref 40.0–52.0)
Hemoglobin: 11 g/dL — ABNORMAL LOW (ref 13.0–18.0)
MCH: 29.5 pg (ref 26.0–34.0)
MCHC: 31.5 g/dL — ABNORMAL LOW (ref 32.0–36.0)
MCV: 93.5 fL (ref 80.0–100.0)
Platelets: 181 10*3/uL (ref 150–440)
RBC: 3.74 MIL/uL — ABNORMAL LOW (ref 4.40–5.90)
RDW: 14.9 % — ABNORMAL HIGH (ref 11.5–14.5)
WBC: 6.5 10*3/uL (ref 3.8–10.6)

## 2015-02-28 LAB — BASIC METABOLIC PANEL
Anion gap: 8 (ref 5–15)
BUN: 48 mg/dL — ABNORMAL HIGH (ref 6–20)
CO2: 26 mmol/L (ref 22–32)
Calcium: 8.7 mg/dL — ABNORMAL LOW (ref 8.9–10.3)
Chloride: 112 mmol/L — ABNORMAL HIGH (ref 101–111)
Creatinine, Ser: 4.37 mg/dL — ABNORMAL HIGH (ref 0.61–1.24)
GFR calc Af Amer: 14 mL/min — ABNORMAL LOW (ref >60)
GFR calc non Af Amer: 12 mL/min — ABNORMAL LOW (ref >60)
Glucose, Bld: 85 mg/dL (ref 65–99)
Potassium: 4.1 mmol/L (ref 3.5–5.1)
Sodium: 146 mmol/L — ABNORMAL HIGH (ref 135–145)

## 2015-02-28 LAB — ABO/RH: ABO/RH(D): O POS

## 2015-02-28 LAB — PROTIME-INR
INR: 1.09
Prothrombin Time: 14.3 seconds (ref 11.4–15.0)

## 2015-02-28 LAB — APTT: aPTT: 31 seconds (ref 24–36)

## 2015-02-28 NOTE — Patient Instructions (Signed)
  Your procedure is scheduled on: Wednesday March 06, 2015 Report to Same Day Surgery. To find out your arrival time please call [phone] between 1PM - 3PM on Tuesday March 05, 2015.  Remember: Instructions that are not followed completely may result in serious medical risk, up to and including death, or upon the discretion of your surgeon and anesthesiologist your surgery may need to be rescheduled.    __x__ 1. Do not eat food or drink liquids after midnight. No gum chewing or hard candies.     ____ 2. No Alcohol for 24 hours before or after surgery.   ____ 3. Bring all medications with you on the day of surgery if instructed.    __x__ 4. Notify your doctor if there is any change in your medical condition     (cold, fever, infections).     Do not wear jewelry, make-up, hairpins, clips or nail polish.  Do not wear lotions, powders, or perfumes. You may wear deodorant.  Do not shave 48 hours prior to surgery. Men may shave face and neck.  Do not bring valuables to the hospital.    [facility] is not responsible for any belongings or valuables.               Contacts, dentures or bridgework may not be worn into surgery.  Leave your suitcase in the car. After surgery it may be brought to your room.  For patients admitted to the hospital, discharge time is determined by your treatment team.   Patients discharged the day of surgery will not be allowed to drive home.    Please read over the following fact sheets that you were given:   [facility] Preparing for Surgery  _x__ Take these medicines the morning of surgery with A SIP OF WATER:    1. amLODipine (NORVASC  2. atorvastatin (LIPITOR)  3. carvedilol (COREG)  4.hydrALAZINE (APRESOLINE)  5.losartan (COZAAR)  6.omeprazole (PRILOSEC)  ____ Fleet Enema (as directed)   __x__ Use CHG Soap as directed  ____ Use inhalers on the day of surgery  ____ Stop metformin 2 days prior to surgery    _x___ Take 1/2 of usual insulin  dose the night before surgery and none on the morning of surgery.   _x___  Aspirin  do not stop.  _x___ Stop Anti-inflammatories on does not apply.  OK to take Tylenol for pain   ____ Stop supplements until after surgery.    ____ Bring C-Pap to the hospital.

## 2015-03-06 ENCOUNTER — Encounter: Admission: RE | Payer: Self-pay | Source: Ambulatory Visit

## 2015-03-06 ENCOUNTER — Ambulatory Visit: Admission: RE | Admit: 2015-03-06 | Payer: Medicare Other | Source: Ambulatory Visit | Admitting: Vascular Surgery

## 2015-03-06 SURGERY — ARTERIOVENOUS (AV) FISTULA CREATION
Anesthesia: Choice | Laterality: Left

## 2015-03-06 MED ORDER — BUPIVACAINE HCL (PF) 0.5 % IJ SOLN
INTRAMUSCULAR | Status: AC
  Filled 2015-03-06: qty 30

## 2015-03-06 MED ORDER — HEPARIN SODIUM (PORCINE) 5000 UNIT/ML IJ SOLN
INTRAMUSCULAR | Status: AC
  Filled 2015-03-06: qty 1

## 2015-03-06 MED ORDER — PAPAVERINE HCL 30 MG/ML IJ SOLN
INTRAMUSCULAR | Status: AC
  Filled 2015-03-06: qty 2

## 2015-05-24 ENCOUNTER — Encounter
Admission: RE | Admit: 2015-05-24 | Discharge: 2015-05-24 | Disposition: A | Discharge status: Home / Self Care | Payer: Medicare Other | Source: Ambulatory Visit | Attending: Vascular Surgery | Admitting: Vascular Surgery

## 2015-05-24 DIAGNOSIS — Z01812 Encounter for preprocedural laboratory examination: Secondary | ICD-10-CM | POA: Insufficient documentation

## 2015-05-24 LAB — CBC
HCT: 32.8 % — ABNORMAL LOW (ref 40.0–52.0)
Hemoglobin: 10.5 g/dL — ABNORMAL LOW (ref 13.0–18.0)
MCH: 30.1 pg (ref 26.0–34.0)
MCHC: 32 g/dL (ref 32.0–36.0)
MCV: 94 fL (ref 80.0–100.0)
Platelets: 184 10*3/uL (ref 150–440)
RBC: 3.49 MIL/uL — ABNORMAL LOW (ref 4.40–5.90)
RDW: 15.6 % — ABNORMAL HIGH (ref 11.5–14.5)
WBC: 7.1 10*3/uL (ref 3.8–10.6)

## 2015-05-24 LAB — TYPE AND SCREEN
ABO/RH(D): O POS
Antibody Screen: NEGATIVE

## 2015-05-24 LAB — BASIC METABOLIC PANEL
Anion gap: 9 (ref 5–15)
BUN: 49 mg/dL — ABNORMAL HIGH (ref 6–20)
CO2: 25 mmol/L (ref 22–32)
Calcium: 8.5 mg/dL — ABNORMAL LOW (ref 8.9–10.3)
Chloride: 111 mmol/L (ref 101–111)
Creatinine, Ser: 4.12 mg/dL — ABNORMAL HIGH (ref 0.61–1.24)
GFR calc Af Amer: 15 mL/min — ABNORMAL LOW (ref >60)
GFR calc non Af Amer: 13 mL/min — ABNORMAL LOW (ref >60)
Glucose, Bld: 127 mg/dL — ABNORMAL HIGH (ref 65–99)
Potassium: 4.4 mmol/L (ref 3.5–5.1)
Sodium: 145 mmol/L (ref 135–145)

## 2015-05-24 LAB — SURGICAL PCR SCREEN
MRSA, PCR: NEGATIVE
Staphylococcus aureus: NEGATIVE

## 2015-05-24 NOTE — Patient Instructions (Signed)
  Your procedure is scheduled on: Friday 05/31/2015 Report to Day Surgery. 2ND FLOOR MEDICAL MALL ENTRANCE To find out your arrival time please call [phone] between 1PM - 3PM on Thursday 05/1715.  Remember: Instructions that are not followed completely may result in serious medical risk, up to and including death, or upon the discretion of your surgeon and anesthesiologist your surgery may need to be rescheduled.    __X__ 1. Do not eat food or drink liquids after midnight. No gum chewing or hard candies.     __X__ 2. No Alcohol for 24 hours before or after surgery.   ____ 3. Bring all medications with you on the day of surgery if instructed.    __X__ 4. Notify your doctor if there is any change in your medical condition     (cold, fever, infections).     Do not wear jewelry, make-up, hairpins, clips or nail polish.  Do not wear lotions, powders, or perfumes.   Do not shave 48 hours prior to surgery. Men may shave face and neck.  Do not bring valuables to the hospital.    [facility] is not responsible for any belongings or valuables.               Contacts, dentures or bridgework may not be worn into surgery.  Leave your suitcase in the car. After surgery it may be brought to your room.  For patients admitted to the hospital, discharge time is determined by your                treatment team.   Patients discharged the day of surgery will not be allowed to drive home.   Please read over the following fact sheets that you were given:   MRSA Information and Surgical Site Infection Prevention   __X__ Take these medicines the morning of surgery with A SIP OF WATER:    1. AMLODIPINE  2. ATORVASTATIN  3. CARVEDILOL  4. HYDRALAZINE  5. LOSARTAN  6. OMEPRAZOLE  ____ Fleet Enema (as directed)   __X__ Use CHG Soap as directed  ____ Use inhalers on the day of surgery  ____ Stop metformin 2 days prior to surgery    __X__ Take 1/2 of usual insulin dose the night before surgery  and none on the morning of surgery.   ____ Stop Coumadin/Plavix/aspirin on OK TO CONTINUE YOUR ASPIRIN  ____ Stop Anti-inflammatories on    ____ Stop supplements until after surgery.    __X__ Bring C-Pap to the hospital.

## 2015-05-31 ENCOUNTER — Ambulatory Visit: Payer: Medicare Other | Admitting: Anesthesiology

## 2015-05-31 ENCOUNTER — Encounter: Payer: Self-pay | Admitting: *Deleted

## 2015-05-31 ENCOUNTER — Encounter
Admission: RE | Disposition: A | Discharge status: Home / Self Care | Payer: Self-pay | Source: Ambulatory Visit | Attending: Vascular Surgery

## 2015-05-31 ENCOUNTER — Ambulatory Visit
Admission: RE | Admit: 2015-05-31 | Discharge: 2015-05-31 | Disposition: A | Discharge status: Home / Self Care | Payer: Medicare Other | Source: Ambulatory Visit | Attending: Vascular Surgery | Admitting: Vascular Surgery

## 2015-05-31 DIAGNOSIS — M109 Gout, unspecified: Secondary | ICD-10-CM | POA: Diagnosis not present

## 2015-05-31 DIAGNOSIS — E1122 Type 2 diabetes mellitus with diabetic chronic kidney disease: Secondary | ICD-10-CM | POA: Insufficient documentation

## 2015-05-31 DIAGNOSIS — Z8249 Family history of ischemic heart disease and other diseases of the circulatory system: Secondary | ICD-10-CM | POA: Insufficient documentation

## 2015-05-31 DIAGNOSIS — Z79899 Other long term (current) drug therapy: Secondary | ICD-10-CM | POA: Insufficient documentation

## 2015-05-31 DIAGNOSIS — G4730 Sleep apnea, unspecified: Secondary | ICD-10-CM | POA: Diagnosis not present

## 2015-05-31 DIAGNOSIS — Z7982 Long term (current) use of aspirin: Secondary | ICD-10-CM | POA: Insufficient documentation

## 2015-05-31 DIAGNOSIS — Z87891 Personal history of nicotine dependence: Secondary | ICD-10-CM | POA: Diagnosis not present

## 2015-05-31 DIAGNOSIS — Z8546 Personal history of malignant neoplasm of prostate: Secondary | ICD-10-CM | POA: Insufficient documentation

## 2015-05-31 DIAGNOSIS — K219 Gastro-esophageal reflux disease without esophagitis: Secondary | ICD-10-CM | POA: Diagnosis not present

## 2015-05-31 DIAGNOSIS — I120 Hypertensive chronic kidney disease with stage 5 chronic kidney disease or end stage renal disease: Secondary | ICD-10-CM | POA: Diagnosis not present

## 2015-05-31 DIAGNOSIS — Z6838 Body mass index (BMI) 38.0-38.9, adult: Secondary | ICD-10-CM | POA: Diagnosis not present

## 2015-05-31 DIAGNOSIS — Z841 Family history of disorders of kidney and ureter: Secondary | ICD-10-CM | POA: Insufficient documentation

## 2015-05-31 DIAGNOSIS — N186 End stage renal disease: Secondary | ICD-10-CM | POA: Insufficient documentation

## 2015-05-31 DIAGNOSIS — Z833 Family history of diabetes mellitus: Secondary | ICD-10-CM | POA: Insufficient documentation

## 2015-05-31 DIAGNOSIS — R0602 Shortness of breath: Secondary | ICD-10-CM | POA: Insufficient documentation

## 2015-05-31 DIAGNOSIS — Z794 Long term (current) use of insulin: Secondary | ICD-10-CM | POA: Insufficient documentation

## 2015-05-31 HISTORY — PX: AV FISTULA PLACEMENT: SHX1204

## 2015-05-31 LAB — GLUCOSE, CAPILLARY
Glucose-Capillary: 81 mg/dL (ref 65–99)
Glucose-Capillary: 76 mg/dL (ref 65–99)

## 2015-05-31 SURGERY — ARTERIOVENOUS (AV) FISTULA CREATION
Anesthesia: General | Site: Arm Upper | Laterality: Left | Wound class: Clean

## 2015-05-31 MED ORDER — HEPARIN SODIUM (PORCINE) 5000 UNIT/ML IJ SOLN
INTRAMUSCULAR | Status: AC
  Filled 2015-05-31: qty 1

## 2015-05-31 MED ORDER — PROPOFOL 10 MG/ML IV BOLUS
INTRAVENOUS | Status: DC | PRN
  Administered 2015-05-31: 150 mg via INTRAVENOUS

## 2015-05-31 MED ORDER — MIDAZOLAM HCL 2 MG/2ML IJ SOLN
INTRAMUSCULAR | Status: DC | PRN
  Administered 2015-05-31: 2 mg via INTRAVENOUS

## 2015-05-31 MED ORDER — SODIUM CHLORIDE 0.9 % IV SOLN
INTRAVENOUS | Status: DC
  Administered 2015-05-31: 12:00:00 via INTRAVENOUS

## 2015-05-31 MED ORDER — EPHEDRINE SULFATE 50 MG/ML IJ SOLN
INTRAMUSCULAR | Status: DC | PRN
  Administered 2015-05-31: 10 mg via INTRAVENOUS

## 2015-05-31 MED ORDER — FENTANYL CITRATE (PF) 100 MCG/2ML IJ SOLN: 25.0000 ug | INTRAMUSCULAR | Status: DC | PRN

## 2015-05-31 MED ORDER — BUPIVACAINE HCL (PF) 0.5 % IJ SOLN
INTRAMUSCULAR | Status: DC | PRN
  Administered 2015-05-31: 10 mL

## 2015-05-31 MED ORDER — FENTANYL CITRATE (PF) 100 MCG/2ML IJ SOLN
INTRAMUSCULAR | Status: DC | PRN
  Administered 2015-05-31: 25 ug via INTRAVENOUS
  Administered 2015-05-31: 50 ug via INTRAVENOUS
  Administered 2015-05-31: 25 ug via INTRAVENOUS

## 2015-05-31 MED ORDER — CEFAZOLIN SODIUM 1-5 GM-% IV SOLN
INTRAVENOUS | Status: AC
  Administered 2015-05-31: 1 g via INTRAVENOUS
  Filled 2015-05-31: qty 50

## 2015-05-31 MED ORDER — SODIUM CHLORIDE 0.9 % IV SOLN
INTRAVENOUS | Status: DC | PRN
  Administered 2015-05-31: 20 mL via INTRAMUSCULAR

## 2015-05-31 MED ORDER — SODIUM CHLORIDE 0.9 % IV SOLN
10000.0000 ug | INTRAVENOUS | Status: DC | PRN
  Administered 2015-05-31: 100 ug via INTRAVENOUS

## 2015-05-31 MED ORDER — BUPIVACAINE HCL (PF) 0.5 % IJ SOLN
INTRAMUSCULAR | Status: AC
  Filled 2015-05-31: qty 30

## 2015-05-31 MED ORDER — CEFAZOLIN SODIUM 1-5 GM-% IV SOLN
1.0000 g | Freq: Once | INTRAVENOUS | Status: AC
  Administered 2015-05-31: 1 g via INTRAVENOUS

## 2015-05-31 MED ORDER — ONDANSETRON HCL 4 MG/2ML IJ SOLN: 4.0000 mg | Freq: Once | INTRAMUSCULAR | Status: DC | PRN

## 2015-05-31 MED ORDER — HYDROCODONE-ACETAMINOPHEN 5-325 MG PO TABS: 1.0000 | ORAL_TABLET | Freq: Four times a day (QID) | ORAL | Status: DC | PRN

## 2015-05-31 MED ORDER — LIDOCAINE HCL (CARDIAC) 20 MG/ML IV SOLN
INTRAVENOUS | Status: DC | PRN
  Administered 2015-05-31: 40 mg via INTRAVENOUS

## 2015-05-31 SURGICAL SUPPLY — 57 items
APPLIER CLIP 11 MED OPEN (CLIP)
APPLIER CLIP 9.375 SM OPEN (CLIP)
APR CLP MED 11 20 MLT OPN (CLIP)
APR CLP SM 9.3 20 MLT OPN (CLIP)
BAG DECANTER FOR FLEXI CONT (MISCELLANEOUS) ×2 IMPLANT
BLADE SURG 11 STRL SS SAFETY (MISCELLANEOUS) ×1 IMPLANT
BLADE SURG SZ11 CARB STEEL (BLADE) ×2 IMPLANT
BOOT SUTURE AID YELLOW STND (SUTURE) ×2 IMPLANT
BRUSH SCRUB 4% CHG (MISCELLANEOUS) ×2 IMPLANT
CANISTER SUCT 1200ML W/VALVE (MISCELLANEOUS) ×2 IMPLANT
CHLORAPREP W/TINT 26ML (MISCELLANEOUS) ×3 IMPLANT
CLIP APPLIE 11 MED OPEN (CLIP) IMPLANT
CLIP APPLIE 9.375 SM OPEN (CLIP) IMPLANT
ELECT CAUTERY BLADE 6.4 (BLADE) ×2 IMPLANT
GEL ULTRASOUND 20GR AQUASONIC (MISCELLANEOUS) IMPLANT
GLOVE SURG SYN 8.0 (GLOVE) ×2 IMPLANT
GLOVE SURG SYN 8.0 PF PI (GLOVE) ×1 IMPLANT
GOWN STRL REUS W/ TWL LRG LVL3 (GOWN DISPOSABLE) ×1 IMPLANT
GOWN STRL REUS W/ TWL XL LVL3 (GOWN DISPOSABLE) ×1 IMPLANT
GOWN STRL REUS W/TWL LRG LVL3 (GOWN DISPOSABLE) ×2
GOWN STRL REUS W/TWL XL LVL3 (GOWN DISPOSABLE) ×2
HEMOSTAT SURGICEL 2X3 (HEMOSTASIS) ×2 IMPLANT
IV NS 500ML (IV SOLUTION) ×2
IV NS 500ML BAXH (IV SOLUTION) ×1 IMPLANT
KIT RM TURNOVER STRD PROC AR (KITS) ×2 IMPLANT
LABEL OR SOLS (LABEL) ×2 IMPLANT
LIQUID BAND (GAUZE/BANDAGES/DRESSINGS) ×2 IMPLANT
LOOP RED MAXI  1X406MM (MISCELLANEOUS) ×1
LOOP VESSEL MAXI 1X406 RED (MISCELLANEOUS) ×1 IMPLANT
LOOP VESSEL MINI 0.8X406 BLUE (MISCELLANEOUS) ×2 IMPLANT
LOOPS BLUE MINI 0.8X406MM (MISCELLANEOUS) ×2
NDL FILTER BLUNT 18X1 1/2 (NEEDLE) ×1 IMPLANT
NDL HYPO 30X.5 LL (NEEDLE) IMPLANT
NEEDLE FILTER BLUNT 18X 1/2SAF (NEEDLE) ×1
NEEDLE FILTER BLUNT 18X1 1/2 (NEEDLE) ×1 IMPLANT
NEEDLE HYPO 30X.5 LL (NEEDLE) IMPLANT
NS IRRIG 500ML POUR BTL (IV SOLUTION) ×2 IMPLANT
PACK EXTREMITY ARMC (MISCELLANEOUS) ×2 IMPLANT
PAD GROUND ADULT SPLIT (MISCELLANEOUS) ×2 IMPLANT
PAD PREP 24X41 OB/GYN DISP (PERSONAL CARE ITEMS) ×1 IMPLANT
PUNCH SURGICAL ROTATE 2.7MM (MISCELLANEOUS) IMPLANT
STOCKINETTE 48X4 2 PLY STRL (GAUZE/BANDAGES/DRESSINGS) ×1 IMPLANT
STOCKINETTE STRL 4IN 9604848 (GAUZE/BANDAGES/DRESSINGS) IMPLANT
SUT MNCRL+ 5-0 UNDYED PC-3 (SUTURE) ×1 IMPLANT
SUT MONOCRYL 5-0 (SUTURE) ×2
SUT PROLENE 6 0 BV (SUTURE) ×9 IMPLANT
SUT SILK 2 0 (SUTURE) ×2
SUT SILK 2-0 18XBRD TIE 12 (SUTURE) ×1 IMPLANT
SUT SILK 3 0 (SUTURE) ×2
SUT SILK 3-0 18XBRD TIE 12 (SUTURE) ×1 IMPLANT
SUT SILK 4 0 (SUTURE) ×2
SUT SILK 4-0 18XBRD TIE 12 (SUTURE) ×1 IMPLANT
SUT VIC AB 3-0 SH 27 (SUTURE) ×2
SUT VIC AB 3-0 SH 27X BRD (SUTURE) ×1 IMPLANT
SYR 20CC LL (SYRINGE) ×2 IMPLANT
SYR 3ML LL SCALE MARK (SYRINGE) ×2 IMPLANT
TOWEL OR 17X26 4PK STRL BLUE (TOWEL DISPOSABLE) IMPLANT

## 2015-05-31 NOTE — Anesthesia Preprocedure Evaluation (Signed)
Anesthesia Evaluation  Patient identified by MRN, date of birth, ID band Patient awake    Reviewed: Allergy & Precautions, H&P , NPO status , Patient's Chart, lab work & pertinent test results, reviewed documented beta blocker date and time   History of Anesthesia Complications Negative for: history of anesthetic complications  Airway Mallampati: II  TM Distance: >3 FB Neck ROM: full    Dental no notable dental hx. (+) Missing, Teeth Intact   Pulmonary shortness of breath and with exertion, sleep apnea , neg COPD, neg recent URI, former smoker,    Pulmonary exam normal breath sounds clear to auscultation       Cardiovascular Exercise Tolerance: Good hypertension, On Medications and On Home Beta Blockers (-) angina(-) CAD, (-) Past MI, (-) Cardiac Stents and (-) CABG negative cardio ROS Normal cardiovascular exam+ dysrhythmias (-) Valvular Problems/Murmurs Rhythm:regular Rate:Normal     Neuro/Psych negative neurological ROS  negative psych ROS   GI/Hepatic Neg liver ROS, GERD  Medicated and Controlled,  Endo/Other  diabetes, Well Controlled, Insulin DependentMorbid obesity  Renal/GU CRFRenal disease  negative genitourinary   Musculoskeletal   Abdominal   Peds  Hematology negative hematology ROS (+)   Anesthesia Other Findings Past Medical History:   Diabetes mellitus                                            Hypertension                                                 Gout                                                         Cancer ([place])                                    2009           Comment:Prostate; radiation seeds   Chronic kidney disease                                         Comment:stage 4   Reproductive/Obstetrics negative OB ROS                             Anesthesia Physical Anesthesia Plan  ASA: III  Anesthesia Plan: General   Post-op Pain Management:     Induction:   Airway Management Planned:   Additional Equipment:   Intra-op Plan:   Post-operative Plan:   Informed Consent: I have reviewed the patients History and Physical, chart, labs and discussed the procedure including the risks, benefits and alternatives for the proposed anesthesia with the patient or authorized representative who has indicated his/her understanding and acceptance.   Dental Advisory Given  Plan Discussed with: Anesthesiologist, CRNA and Surgeon  Anesthesia Plan Comments:  Anesthesia Quick Evaluation  

## 2015-05-31 NOTE — H&P (Signed)
[facility] History & Physical Update  The patient was interviewed and re-examined.  The patient's previous History and Physical has been reviewed and is unchanged.  There is no change in the plan of care. We plan to proceed with the scheduled procedure.  Evangelia Whitaker, Dolores Lory, MD  05/31/2015, 11:15 AM

## 2015-05-31 NOTE — Transfer of Care (Signed)
Immediate Anesthesia Transfer of Care Note  Patient: Justin [name]  Procedure(s) Performed: Procedure(s): ARTERIOVENOUS (AV) FISTULA CREATION (Left)  Patient Location: PACU  Anesthesia Type:General  Level of Consciousness: sedated  Airway & Oxygen Therapy: Patient Spontanous Breathing and Patient connected to face mask oxygen  Post-op Assessment: Report given to RN and Post -op Vital signs reviewed and stable  Post vital signs: Reviewed and stable  Last Vitals:  Filed Vitals:   05/31/15 1703  BP: 121/72  Pulse: 143  Temp: 36.4 C  Resp: 15    Complications: No apparent anesthesia complications

## 2015-05-31 NOTE — Anesthesia Procedure Notes (Signed)
Procedure Name: LMA Insertion Date/Time: 05/31/2015 3:22 PM Performed by: Allean Found Pre-anesthesia Checklist: Patient identified, Patient being monitored, Timeout performed, Emergency Drugs available and Suction available Patient Re-evaluated:Patient Re-evaluated prior to inductionOxygen Delivery Method: Circle system utilized Preoxygenation: Pre-oxygenation with 100% oxygen Intubation Type: IV induction Ventilation: Mask ventilation without difficulty LMA: LMA inserted LMA Size: 5.0 Tube type: Oral Number of attempts: 1 Placement Confirmation: positive ETCO2 and breath sounds checked- equal and bilateral Tube secured with: Tape Dental Injury: Teeth and Oropharynx as per pre-operative assessment

## 2015-05-31 NOTE — Discharge Instructions (Signed)
AMBULATORY SURGERY  DISCHARGE INSTRUCTIONS   1) The drugs that you were given will stay in your system until tomorrow so for the next 24 hours you should not:  A) Drive an automobile B) Make any legal decisions C) Drink any alcoholic beverage   2) You may resume regular meals tomorrow.  Today it is better to start with liquids and gradually work up to solid foods.  You may eat anything you prefer, but it is better to start with liquids, then soup and crackers, and gradually work up to solid foods.   3) Please notify your doctor immediately if you have any unusual bleeding, trouble breathing, redness and pain at the surgery site, drainage, fever, or pain not relieved by medication.    4) Additional Instructions:   AV Fistula, Care After Refer to this sheet in the next few weeks. These instructions provide you with information on caring for yourself after your procedure. Your caregiver may also give you more specific instructions. Your treatment has been planned according to current medical practices, but problems sometimes occur. Call your caregiver if you have any problems or questions after your procedure. HOME CARE INSTRUCTIONS   Do not drive a car or take public transportation alone.  Do not drink alcohol.  Only take medicine that has been prescribed by your caregiver.  Do not sign important papers or make important decisions.  Have a responsible person with you.  Ask your caregiver to show you how to check your access at home for a vibration (called a "thrill") or for a sound (called a "bruit" pronounced brew-ee).  Your vein will need time to enlarge and mature so needles can be inserted for dialysis. Follow your caregiver's instructions about what you need to do to make this happen.  Keep dressings clean and dry.  Keep the arm elevated above your heart. Use a pillow.  Rest.  Use the arm as usual for all activities.  Have the stitches or tape closures removed in  10 to 14 days, or as directed by your caregiver.  Do not sleep or lie on the area of the fistula or that arm. This may decrease or stop the blood flow through your fistula.  Do not allow blood pressures to be taken on this arm.  Do not allow blood drawing to be done from the graft.  Do not wear tight clothing around the access site or on the arm.  Avoid lifting heavy objects with the arm that has the fistula.  Do not use creams or lotions over the access site. SEEK MEDICAL CARE IF:   You have a fever.  You have swelling around the fistula that gets worse, or you have new pain.  You have unusual bleeding at the fistula site or from any other area.  You have pus or other drainage at the fistula site.  You have skin redness or red streaking on the skin around, above, or below the fistula site.  Your access site feels warm.  You have any flu-like symptoms. SEEK IMMEDIATE MEDICAL CARE IF:   You have pain, numbness, or an unusual pale skin on the hand or on the side of your fistula.  You have dizziness or weakness that you have not had before.  You have shortness of breath.  You have chest pain.  Your fistula disconnects or breaks, and there is bleeding that cannot be easily controlled. Call for local emergency medical help. Do not try to drive yourself to the hospital. MAKE  SURE YOU  Understand these instructions.  Will watch your condition.  Will get help right away if you are not doing well or get worse.   This information is not intended to replace advice given to you by your health care provider. Make sure you discuss any questions you have with your health care provider.   Document Released: 06/29/2005 Document Revised: 07/20/2014 Document Reviewed: 12/17/2010 Elsevier Interactive Patient Education 2016 [place].      Please contact your physician with any problems or Same Day Surgery at [phone], Monday through Friday 6 am to 4 pm, or [place] at  [facility] number at [phone].

## 2015-05-31 NOTE — Anesthesia Postprocedure Evaluation (Signed)
  Anesthesia Post-op Note  Patient: Justin [name]  Procedure(s) Performed: Procedure(s): ARTERIOVENOUS (AV) FISTULA CREATION (Left)  Anesthesia type:General  Patient location: PACU  Post pain: Pain level controlled  Post assessment: Post-op Vital signs reviewed, Patient's Cardiovascular Status Stable, Respiratory Function Stable, Patent Airway and No signs of Nausea or vomiting  Post vital signs: Reviewed and stable  Last Vitals:  Filed Vitals:   05/31/15 1800  BP: 145/75  Pulse: 73  Temp:   Resp: 18    Level of consciousness: awake, alert  and patient cooperative  Complications: No apparent anesthesia complications

## 2015-05-31 NOTE — Op Note (Signed)
     OPERATIVE NOTE   PROCEDURE: left brachial cephalic arteriovenous fistula placement  PRE-OPERATIVE DIAGNOSIS: End Stage Renal Disease  POST-OPERATIVE DIAGNOSIS: End Stage Renal Disease  SURGEON: Schnier, Dolores Lory  ASSISTANT(S): Ms. Hezzie Bump  ANESTHESIA: general  ESTIMATED BLOOD LOSS: <50 cc  FINDING(S): 5 mm cephalic vein  SPECIMEN(S):  none  INDICATIONS:   Justin [name] is a 69 y.o. male who presents with end stage renal disease.  The patient is scheduled for left brachiocephalic arteriovenous fistula placement.  The patient is aware the risks include but are not limited to: bleeding, infection, steal syndrome, nerve damage, ischemic monomelic neuropathy, failure to mature, and need for additional procedures.  The patient is aware of the risks of the procedure and elects to proceed forward.  DESCRIPTION: After full informed written consent was obtained from the patient, the patient was brought back to the operating room and placed supine upon the operating table.  Prior to induction, the patient received IV antibiotics.   After obtaining adequate anesthesia, the patient was then prepped and draped in the standard fashion for a left arm access procedure.    A curvilinear incision was then created midway between the radial impulse and the cephalic vein. The cephalic vein was then identified and dissected circumferentially. It was marked with a surgical marker.    Attention was then turned to the brachial artery which was exposed through the same incision and looped proximally and distally. Side branches were controlled with 4-0 silk ties.  The distal segment of the vein was ligated with a  2-0 silk, and the vein was transected.  The proximal segment was interrogated with serial dilators.  The vein accepted up to a 5 mm dilator without any difficulty. Heparinized saline was infused into the vein and clamped it with a small bulldog.  At this point, I reset my exposure of  the brachial artery and controlled the artery with vessel loops proximally and distally.  An arteriotomy was then made with a #11 blade, and extended with a Potts scissor.  Heparinized saline was injected proximal and distal into the radial artery.  The vein was then approximated to the artery while the artery was in its native bed and subsequently the vein was beveled using Potts scissors. The vein was then sewn to the artery in an end-to-side configuration with a running stitch of 6-0 Prolene.  Prior to completing this anastomosis Flushing maneuvers were performed and the artery was allowed to forward and back bleed.  There was no evidence of clot from any vessels.  I completed the anastomosis in the usual fashion and then released all vessel loops and clamps.    There was good  thrill in the venous outflow, and there was 1+ palpable radial pulse.  At this point, I irrigated out the surgical wound.  There was no further active bleeding.  The subcutaneous tissue was reapproximated with a running stitch of 3-0 Vicryl.  The skin was then reapproximated with a running subcuticular stitch of 4-0 Vicryl.  The skin was then cleaned, dried, and reinforced with Dermabond.    The patient tolerated this procedure well.   COMPLICATIONS: None  CONDITION: Justin [name]  Office: [phone]   05/31/2015, 4:37 PM

## 2015-06-03 ENCOUNTER — Encounter: Payer: Self-pay | Admitting: Vascular Surgery

## 2015-09-30 ENCOUNTER — Ambulatory Visit (HOSPITAL_COMMUNITY): Payer: Medicare Other | Admitting: Hematology & Oncology

## 2015-10-01 ENCOUNTER — Encounter (HOSPITAL_COMMUNITY): Payer: Medicare Other | Attending: Hematology & Oncology | Admitting: Hematology & Oncology

## 2015-10-01 ENCOUNTER — Encounter (HOSPITAL_COMMUNITY): Payer: Medicare Other

## 2015-10-01 ENCOUNTER — Encounter (HOSPITAL_COMMUNITY): Payer: Self-pay | Admitting: Hematology & Oncology

## 2015-10-01 VITALS — BP 154/71 | HR 78 | Temp 98.1°F | Resp 20 | Ht 70.0 in | Wt 272.6 lb

## 2015-10-01 DIAGNOSIS — Z8546 Personal history of malignant neoplasm of prostate: Secondary | ICD-10-CM | POA: Diagnosis not present

## 2015-10-01 DIAGNOSIS — N184 Chronic kidney disease, stage 4 (severe): Secondary | ICD-10-CM | POA: Diagnosis present

## 2015-10-01 DIAGNOSIS — D649 Anemia, unspecified: Secondary | ICD-10-CM | POA: Diagnosis not present

## 2015-10-01 DIAGNOSIS — R5382 Chronic fatigue, unspecified: Secondary | ICD-10-CM | POA: Diagnosis not present

## 2015-10-01 LAB — CBC WITH DIFFERENTIAL/PLATELET
Basophils Absolute: 0 10*3/uL (ref 0.0–0.1)
Basophils Relative: 0 %
Eosinophils Absolute: 0.3 10*3/uL (ref 0.0–0.7)
Eosinophils Relative: 5 %
HCT: 31.5 % — ABNORMAL LOW (ref 39.0–52.0)
Hemoglobin: 9.8 g/dL — ABNORMAL LOW (ref 13.0–17.0)
Lymphocytes Relative: 38 %
Lymphs Abs: 2.6 10*3/uL (ref 0.7–4.0)
MCH: 29.3 pg (ref 26.0–34.0)
MCHC: 31.1 g/dL (ref 30.0–36.0)
MCV: 94.3 fL (ref 78.0–100.0)
Monocytes Absolute: 0.5 10*3/uL (ref 0.1–1.0)
Monocytes Relative: 7 %
Neutro Abs: 3.4 10*3/uL (ref 1.7–7.7)
Neutrophils Relative %: 50 %
Platelets: 212 10*3/uL (ref 150–400)
RBC: 3.34 MIL/uL — ABNORMAL LOW (ref 4.22–5.81)
RDW: 14.5 % (ref 11.5–15.5)
WBC: 6.8 10*3/uL (ref 4.0–10.5)

## 2015-10-01 LAB — FOLATE: Folate: 10.7 ng/mL (ref >5.9)

## 2015-10-01 LAB — IRON AND TIBC
Iron: 42 ug/dL — ABNORMAL LOW (ref 45–182)
Saturation Ratios: 19 % (ref 17.9–39.5)
TIBC: 221 ug/dL — ABNORMAL LOW (ref 250–450)
UIBC: 182 ug/dL

## 2015-10-01 LAB — RETICULOCYTES
RBC.: 3.34 MIL/uL — ABNORMAL LOW (ref 4.22–5.81)
Retic Count, Absolute: 40.1 10*3/uL (ref 19.0–186.0)
Retic Ct Pct: 1.2 % (ref 0.4–3.1)

## 2015-10-01 LAB — LACTATE DEHYDROGENASE: LDH: 177 U/L (ref 98–192)

## 2015-10-01 LAB — SEDIMENTATION RATE: Sed Rate: 33 mm/hr — ABNORMAL HIGH (ref 0–16)

## 2015-10-01 LAB — VITAMIN B12: Vitamin B-12: 599 pg/mL (ref 180–914)

## 2015-10-01 LAB — PSA: PSA: 0.87 ng/mL (ref 0.00–4.00)

## 2015-10-01 NOTE — Progress Notes (Signed)
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 male is here because of referral by his PCP, Dr. Lynita [name], for evaluation and discussion of abnormal blood counts.   Labs available from outside lab via PCP, Dr. Lynita [name] On 09/06/15: HGB 8.9 g/dL. HCT 28.8%. MCV 91.7 fL. Normal WBC and normal platelet count.  On 07/24/15: HGB 10.1 g/dL. HCT 31.0%. MCV 92.3 fL. Normal WBC and normal platelet count.  On 04/17/2015: HGB 9.7 g/dL. HCT 31.7%. MCV 94.3 fL. Normal WBC and normal platelet count.  On 01/09/2015: HGB 10.6 g/dL. HCT 35.0%. MCV 95.2 fL. Normal WBC and normal platelet count.  On 10/03/2014: HGB 10.4 g/dL. HCT 33.0%. MCV 92.6 fL. Normal WBC and normal platelet count.   Mr. Justin [name] is accompanied by his girlfriend. I personally reviewed and went over laboratory studies with the patient at length  He has a history of prostate cancer, treated with brachytherapy at [facility] with Dr. Sondra [name] in 2009. He has not received pelvic imaging any time recently. He believes his last PSA was 4.0, further stating his PSA has never been down to 0. His previous urologist, Dr. Viann [name], retired and he has only followed with [facility] urology twice.   He will begin dialysis in the near future, and will have "the thing put in his arm in 3 to 4 months". He follows with his kidney doctor in [place] every 3 months. She has never spoken with him about his blood counts being low due to his kidney disease. He last saw his kidney doctor last month.   Patient states he feels fine. However, his energy levels decreased after prostate brachytherapy and his energy has not returned since. He is still able to do all the  things he wants to do, but he has to do it slowly. He is able to walk up a flight of steps. His girlfriend states she tries to get him to go walking, however he doesn't. Describes his appetite as "too good".   Reports pain in his lower back and left hip, worsens with activity. This pain feels like someone is sticking pins in his back. This has been happening over the past 4 to 5 years. He is unable to put a lot of weight on his left hip or it will give out. He has never had this pain looked into. It was not happening before his prostate cancer diagnosis. He has only spoken with his PCP about his hip pain, but not about the back pain. He has received imaging to investigate arthritis. He was given a prescription for pills about 2 years ago that helped manage the pain.   Reports swelling in his left leg over the past year. When he goes to bed at night and wakes up the next morning, his left leg is normal sized. Throughout the day the swelling happens. Denies any surgery or injury to his left side. He has received ultrasound imaging of his left leg here at [facility] and in [place]. The last ultrasound was performed last year.   He would like to have future studies performed before or after spring break as he will be taking 20 kids on vacation that weekend. He is otherwise agreeable to anemia evaluation.  MEDICAL HISTORY:  Past Medical History  Diagnosis Date  . Diabetes mellitus   . Hypertension   . Gout   . Cancer [facility]) 2009    Prostate; radiation seeds  . Chronic kidney disease     stage 4  . Anemia     SURGICAL HISTORY: Past Surgical History  Procedure Laterality Date  . Shoulder surgery Left 1994    rotator cuff  . Knee surgery Left 1998    arthroscopy  . Excision bone spurs Bilateral 1989    feet  . Av fistula placement Left 05/31/2015    Procedure: ARTERIOVENOUS (AV) FISTULA CREATION;  Surgeon: Katha Cabal, MD;  Location: [facility] ORS;  Service: Vascular;  Laterality: Left;     SOCIAL HISTORY: Social History   Social History  . Marital Status: Widowed    Spouse Name: N/A  . Number of Children: N/A  . Years of Education: N/A   Occupational History  . Not on file.   Social History Main Topics  . Smoking status: Former Smoker    Quit date: 10/01/1978  . Smokeless tobacco: Not on file  . Alcohol Use: No  . Drug Use: No  . Sexual Activity: Not on file   Other Topics Concern  . Not on file   Social History Narrative   Girlfriend of 2 years 7 children 31 grandchildren 30 great grandchildren Drove tour buses for 73 years.  Ex smoker, quit smoking about 35-40 years ago. ETOH, none He enjoys gambling.   FAMILY HISTORY: Family History  Problem Relation Age of Onset  . Kidney disease Mother   . Alcoholism Father   . Alcoholism Brother    indicated that his mother is deceased. He indicated that his father is deceased. He indicated that both of his sisters are alive. He indicated that his brother is deceased.   Father is deceased of alcoholism Mother is deceased in 36 and was 89 or 37 yo. She died of kidney disease All of his brothers are dead. 1 died of alcoholism and drug abuse. 1 was killed. He has 2 sisters living.  ALLERGIES:  has No Known Allergies.  MEDICATIONS:  Current Outpatient Prescriptions  Medication Sig Dispense Refill  . allopurinol (ZYLOPRIM) 100 MG tablet Take 100 mg by mouth every morning.     Marland Kitchen amLODipine (NORVASC) 10 MG tablet Take 10 mg by mouth every morning.     Marland Kitchen aspirin 81 MG tablet Take 81 mg by mouth every morning.    . carvedilol (COREG) 25 MG tablet Take 25 mg by mouth 2 (two) times daily with a meal.      . doxazosin (CARDURA) 8 MG tablet Take 8 mg by mouth at bedtime.      . hydrALAZINE (APRESOLINE) 50 MG tablet Take 50 mg by mouth 2 (two) times daily.    . insulin glargine (LANTUS) 100 UNIT/ML injection Inject 10 Units into the skin at bedtime.     Marland Kitchen losartan (COZAAR) 100 MG tablet Take 100 mg by mouth  daily.      Marland Kitchen omeprazole (PRILOSEC) 20 MG capsule Take 20 mg by mouth every morning.     . torsemide (DEMADEX) 20 MG tablet Take 20 mg by mouth 2 (two) times daily.    Marland Kitchen atorvastatin (LIPITOR) 40 MG tablet Take 40 mg by mouth every morning. Reported on 10/01/2015    . cyclobenzaprine (FLEXERIL) 5 MG tablet Take 1 tablet (5 mg total) by mouth 3 (three) times daily as needed. (Patient not  taking: Reported on 05/31/2015) 30 tablet 0  . glipiZIDE (GLUCOTROL) 5 MG tablet Take 5 mg by mouth 2 (two) times daily before a meal. Reported on 10/01/2015    . HYDROcodone-acetaminophen (NORCO/VICODIN) 5-325 MG per tablet Take 1 tablet by mouth every 6 (six) hours as needed for pain. (Patient not taking: Reported on 10/01/2015) 30 tablet 0  . potassium chloride (KLOR-CON) 10 MEQ CR tablet Take 10 mEq by mouth every morning. Reported on 10/01/2015    . predniSONE (DELTASONE) 20 MG tablet Take 2 po QD x 3d then 1 po QD x 4d (Patient not taking: Reported on 05/24/2015) 10 tablet 0  . traMADol (ULTRAM) 50 MG tablet Take 2 tablets (100 mg total) by mouth every 6 (six) hours as needed. (Patient not taking: Reported on 05/24/2015) 16 tablet 0   No current facility-administered medications for this visit.    Review of Systems  Constitutional: Positive for malaise/fatigue.       Persistent fatigue since prostate brachytherapy in 2009  HENT: Negative.   Eyes: Negative.   Respiratory: Negative.   Cardiovascular: Positive for leg swelling.       Persistent Left leg swelling that occurs throughout the day, beginning about one year ago.  Gastrointestinal: Negative.   Genitourinary: Negative.   Musculoskeletal: Positive for back pain and joint pain.       Lower back and left hip pain over the past 4 to 5 years.  Skin: Negative.   Neurological: Negative.   Endo/Heme/Allergies: Negative.   Psychiatric/Behavioral: Negative.   All other systems reviewed and are negative.  14 point ROS was done and is otherwise as detailed  above or in HPI   PHYSICAL EXAMINATION: ECOG PERFORMANCE STATUS: 1 - Symptomatic but completely ambulatory  Filed Vitals:   10/01/15 1420  BP: 154/71  Pulse: 78  Temp: 98.1 F (36.7 C)  Resp: 20   Filed Weights   10/01/15 1420  Weight: 272 lb 9.6 oz (123.651 kg)     Physical Exam  Constitutional: He is oriented to person, place, and time and well-developed, well-nourished, and in no distress.  HENT:  Head: Normocephalic and atraumatic.  Nose: Nose normal.  Mouth/Throat: Oropharynx is clear and moist. No oropharyngeal exudate.  Eyes: Conjunctivae and EOM are normal. Pupils are equal, round, and reactive to light. Right eye exhibits no discharge. Left eye exhibits no discharge. No scleral icterus.  Neck: Normal range of motion. Neck supple. No tracheal deviation present. No thyromegaly present.  Cardiovascular: Normal rate and regular rhythm.  Exam reveals no gallop and no friction rub.   Murmur heard. Systolic ejection murmur heard.  Pulmonary/Chest: Effort normal and breath sounds normal. He has no wheezes. He has no rales.  Abdominal: Soft. Bowel sounds are normal. He exhibits no distension and no mass. There is no tenderness. There is no rebound and no guarding.  Musculoskeletal: Normal range of motion. He exhibits edema.  LLE twice the size of the RLE, with chronic skin changes extending to the left knee.   Lymphadenopathy:    He has no cervical adenopathy.  Neurological: He is alert and oriented to person, place, and time. He has normal reflexes. No cranial nerve deficit. Gait normal. Coordination normal.  Skin: Skin is warm and dry. No rash noted.  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  Psychiatric: Mood, memory, affect and judgment normal.  Nursing note and vitals reviewed.   LABORATORY DATA:  I have reviewed the data as listed Lab Results  Component Value Date  WBC 7.1 05/24/2015   HGB 10.5* 05/24/2015   HCT 32.8* 05/24/2015   MCV 94.0 05/24/2015   PLT 184  05/24/2015   CMP     Component Value Date/Time   NA 145 05/24/2015 1350   K 4.4 05/24/2015 1350   CL 111 05/24/2015 1350   CO2 25 05/24/2015 1350   GLUCOSE 127* 05/24/2015 1350   BUN 49* 05/24/2015 1350   CREATININE 4.12* 05/24/2015 1350   CALCIUM 8.5* 05/24/2015 1350   PROT 6.4 07/11/2011 0629   ALBUMIN 2.4* 07/11/2011 0629   AST 14 07/11/2011 0629   ALT 11 07/11/2011 0629   ALKPHOS 77 07/11/2011 0629   BILITOT 0.2* 07/11/2011 0629   GFRNONAA 13* 05/24/2015 1350   GFRAA 15* 05/24/2015 1350     ASSESSMENT & PLAN:  Anemia Chronic Fatigue 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 Most likely his anemia is secondary to his severe CKD. We discussed this today in detail.   Orders Placed This Encounter  Procedures  . CBC with Differential  . Sedimentation rate  . PSA  . Vitamin B12  . Folate  . Iron and TIBC  . Erythropoietin  . Immunofixation electrophoresis  . Protein electrophoresis, serum  . IgG, IgA, IgM  . Lactate dehydrogenase  . Reticulocytes   I will see him again in 2 weeks to discuss lab results. We will make additional recommendations at that time.   All questions were answered. The patient knows to call the clinic with any problems, questions or concerns.  This document serves as a record of services personally performed by Ancil Linsey, MD. It was created on her behalf by Arlyce Harman, a trained medical scribe. The creation of this record is based on the scribe's personal observations and the provider's statements to them. This document has been checked and approved by the attending provider.  I have reviewed the above documentation for accuracy and completeness, and I agree with the above.  This note was electronically signed.    Molli Hazard, MD  10/01/2015 3:19 PM

## 2015-10-01 NOTE — Patient Instructions (Addendum)
[place] at [facility] Discharge Instructions  RECOMMENDATIONS MADE BY THE CONSULTANT AND ANY TEST RESULTS WILL BE SENT TO YOUR REFERRING PHYSICIAN.   Exam done and seen by Dr.Penland Kidneys tell your body to make red blood cells, Your blood count (hemoglobin) is low right now at 8.9 Normal for a male is about 26 Some of your energy issues could be from a decrease in your blood counts  Return to see the doctor in 2 weeks to discuss lab results. Please call the clinic if you have any questions or concerns Will get a release form to get your old records.     Thank you for choosing [place] at [facility] to provide your oncology and hematology care.  To afford each patient quality time with our provider, please arrive at least 15 minutes before your scheduled appointment time.   Beginning January 23rd 2017 lab work for the [facility] will be done in the  Main lab at [facility] on 1st floor. If you have a lab appointment with the [facility] please come in thru the  Main Entrance and check in at the main information desk  You need to re-schedule your appointment should you arrive 10 or more minutes late.  We strive to give you quality time with our providers, and arriving late affects you and other patients whose appointments are after yours.  Also, if you no show three or more times for appointments you may be dismissed from the clinic at the providers discretion.     Again, thank you for choosing [facility].  Our hope is that these requests will decrease the amount of time that you wait before being seen by our physicians.       _____________________________________________________________  Should you have questions after your visit to [facility], please contact our office at [phone] between the hours of 8:30 a.m. and 4:30 p.m.  Voicemails left after 4:30 p.m. will not be returned until the  following business day.  For prescription refill requests, have your pharmacy contact our office.         Resources For Cancer Patients and their Caregivers ? American Cancer Society: Can assist with transportation, wigs, general needs, runs Look Good Feel Better.        423-833-0970 ? Cancer Care: Provides financial assistance, online support groups, medication/co-pay assistance.  1-800-813-HOPE 920-390-4780) ? [facility] Assists [place] cancer patients and their families through emotional , educational and financial support.  417 030 2130 ? [place] DSS Where to apply for food stamps, Medicaid and utility assistance. 825-867-1041 ? RCATS: Transportation to medical appointments. (307) 530-4863 ? Social Security Administration: May apply for disability if have a Stage IV cancer. ([phone] ? [employer], Disability and Transit Services: Assists with nutrition, care and transit needs. [phone]

## 2015-10-02 LAB — PROTEIN ELECTROPHORESIS, SERUM
A/G Ratio: 0.9 (ref 0.7–1.7)
Albumin ELP: 3.4 g/dL (ref 2.9–4.4)
Alpha-1-Globulin: 0.2 g/dL (ref 0.0–0.4)
Alpha-2-Globulin: 0.7 g/dL (ref 0.4–1.0)
Beta Globulin: 1 g/dL (ref 0.7–1.3)
Gamma Globulin: 1.8 g/dL (ref 0.4–1.8)
Globulin, Total: 3.7 g/dL (ref 2.2–3.9)
Total Protein ELP: 7.1 g/dL (ref 6.0–8.5)

## 2015-10-02 LAB — IMMUNOFIXATION ELECTROPHORESIS
IgA: 429 mg/dL (ref 61–437)
IgG (Immunoglobin G), Serum: 1913 mg/dL — ABNORMAL HIGH (ref 700–1600)
IgM, Serum: 87 mg/dL (ref 15–143)
Total Protein ELP: 7.2 g/dL (ref 6.0–8.5)

## 2015-10-02 LAB — ERYTHROPOIETIN: Erythropoietin: 17.7 m[IU]/mL (ref 2.6–18.5)

## 2015-10-02 LAB — IGG, IGA, IGM
IgA: 424 mg/dL (ref 61–437)
IgG (Immunoglobin G), Serum: 1772 mg/dL — ABNORMAL HIGH (ref 700–1600)
IgM, Serum: 85 mg/dL (ref 15–143)

## 2015-10-16 ENCOUNTER — Encounter (HOSPITAL_COMMUNITY): Payer: Self-pay | Admitting: Lab

## 2015-10-16 ENCOUNTER — Encounter (HOSPITAL_COMMUNITY): Payer: Self-pay | Admitting: Hematology & Oncology

## 2015-10-16 ENCOUNTER — Encounter (HOSPITAL_COMMUNITY): Payer: Medicare Other

## 2015-10-16 ENCOUNTER — Encounter (HOSPITAL_COMMUNITY): Payer: Medicare Other | Attending: Hematology & Oncology | Admitting: Hematology & Oncology

## 2015-10-16 VITALS — BP 139/68 | HR 72 | Temp 97.6°F | Resp 20 | Wt 272.3 lb

## 2015-10-16 DIAGNOSIS — R5382 Chronic fatigue, unspecified: Secondary | ICD-10-CM

## 2015-10-16 DIAGNOSIS — D649 Anemia, unspecified: Secondary | ICD-10-CM | POA: Diagnosis not present

## 2015-10-16 DIAGNOSIS — N184 Chronic kidney disease, stage 4 (severe): Secondary | ICD-10-CM | POA: Diagnosis not present

## 2015-10-16 DIAGNOSIS — D890 Polyclonal hypergammaglobulinemia: Secondary | ICD-10-CM | POA: Diagnosis not present

## 2015-10-16 DIAGNOSIS — Z8546 Personal history of malignant neoplasm of prostate: Secondary | ICD-10-CM

## 2015-10-16 DIAGNOSIS — R972 Elevated prostate specific antigen [PSA]: Secondary | ICD-10-CM

## 2015-10-16 LAB — COMPREHENSIVE METABOLIC PANEL
ALT: 13 U/L — ABNORMAL LOW (ref 17–63)
AST: 15 U/L (ref 15–41)
Albumin: 3.6 g/dL (ref 3.5–5.0)
Alkaline Phosphatase: 115 U/L (ref 38–126)
Anion gap: 8 (ref 5–15)
BUN: 61 mg/dL — ABNORMAL HIGH (ref 6–20)
CO2: 24 mmol/L (ref 22–32)
Calcium: 8.2 mg/dL — ABNORMAL LOW (ref 8.9–10.3)
Chloride: 114 mmol/L — ABNORMAL HIGH (ref 101–111)
Creatinine, Ser: 4.61 mg/dL — ABNORMAL HIGH (ref 0.61–1.24)
GFR calc Af Amer: 13 mL/min — ABNORMAL LOW (ref >60)
GFR calc non Af Amer: 12 mL/min — ABNORMAL LOW (ref >60)
Glucose, Bld: 91 mg/dL (ref 65–99)
Potassium: 4.7 mmol/L (ref 3.5–5.1)
Sodium: 146 mmol/L — ABNORMAL HIGH (ref 135–145)
Total Bilirubin: 0.7 mg/dL (ref 0.3–1.2)
Total Protein: 7.4 g/dL (ref 6.5–8.1)

## 2015-10-16 LAB — FERRITIN: Ferritin: 134 ng/mL (ref 24–336)

## 2015-10-16 NOTE — Patient Instructions (Addendum)
[place] at [facility] Discharge Instructions  RECOMMENDATIONS MADE BY THE CONSULTANT AND ANY TEST RESULTS WILL BE SENT TO YOUR REFERRING PHYSICIAN.    Exam and discussion by Dr Whitney Muse today You need to stop and get more labs on your way out. We will need to get a bone marrow biopsy, this will be done at [facility], they make you sleepy, you will need a driver that day. Referral to [facility] for your prostate cancer  Return to see the doctor about 2 weeks after your bone marrow biopsy  Please call the clinic if you have any questions or concerns      Bone Marrow Aspiration and Bone Marrow Biopsy Bone marrow aspiration and bone marrow biopsy are procedures that are done to diagnose blood disorders. You may also have one of these procedures to help diagnose infections or some types of cancer. Bone marrow is the soft tissue that is inside your bones. Blood cells are produced in bone marrow. For bone marrow aspiration, a sample of tissue in liquid form is removed from inside your bone. For a bone marrow biopsy, a small core of bone marrow tissue is removed. Then these samples are examined under a microscope or tested in a lab. You may need these procedures if you have an abnormal complete blood count (CBC). The aspiration or biopsy sample is usually taken from the top of your hip bone. Sometimes, an aspiration sample is taken from your chest bone (sternum). LET [facility] CARE PROVIDER KNOW ABOUT:  Any allergies you have.  All medicines you are taking, including vitamins, herbs, eye drops, creams, and over-the-counter medicines.  Previous problems you or members of your family have had with the use of anesthetics.  Any blood disorders you have.  Previous surgeries you have had.  Any medical conditions you may have.  Whether you are pregnant or you think that you may be pregnant. RISKS AND COMPLICATIONS Generally, this is a safe procedure.  However, problems may occur, including:  Infection.  Bleeding. BEFORE THE PROCEDURE  Ask your health care provider about:  Changing or stopping your regular medicines. This is especially important if you are taking diabetes medicines or blood thinners.  Taking medicines such as aspirin and ibuprofen. These medicines can thin your blood. Do not take these medicines before your procedure if your health care provider instructs you not to.  Plan to have someone take you home after the procedure.  If you go home right after the procedure, plan to have someone with you for 24 hours. PROCEDURE   An IV tube may be inserted into one of your veins.  The injection site will be cleaned with a germ-killing solution (antiseptic).  You will be given one or more of the following:  A medicine that helps you relax (sedative).  A medicine that numbs the area (local anesthetic).  The bone marrow sample will be removed as follows:  For an aspiration, a hollow needle will be inserted through your skin and into your bone. Bone marrow fluid will be drawn up into a syringe.  For a biopsy, your health care provider will use a hollow needle to remove a core of tissue from your bone marrow.  The needle will be removed.  A bandage (dressing) will be placed over the insertion site and taped in place. The procedure may vary among health care providers and hospitals. AFTER THE PROCEDURE  Your blood pressure, heart rate, breathing rate, and blood  oxygen level will be monitored often until the medicines you were given have worn off.  Return to your normal activities as directed by your health care provider.   This information is not intended to replace advice given to you by your health care provider. Make sure you discuss any questions you have with your health care provider.   Document Released: 07/02/2004 Document Revised: 11/13/2014 Document Reviewed: 06/20/2014 Elsevier Interactive Patient  Education 2016 [employer].      Thank you for choosing [place] at [facility] to provide your oncology and hematology care.  To afford each patient quality time with our provider, please arrive at least 15 minutes before your scheduled appointment time.   Beginning January 23rd 2017 lab work for the [facility] will be done in the  Main lab at [facility] on 1st floor. If you have a lab appointment with the [facility] please come in thru the  Main Entrance and check in at the main information desk  You need to re-schedule your appointment should you arrive 10 or more minutes late.  We strive to give you quality time with our providers, and arriving late affects you and other patients whose appointments are after yours.  Also, if you no show three or more times for appointments you may be dismissed from the clinic at the providers discretion.     Again, thank you for choosing [facility].  Our hope is that these requests will decrease the amount of time that you wait before being seen by our physicians.       _____________________________________________________________  Should you have questions after your visit to [facility], please contact our office at [phone] between the hours of 8:30 a.m. and 4:30 p.m.  Voicemails left after 4:30 p.m. will not be returned until the following business day.  For prescription refill requests, have your pharmacy contact our office.         Resources For Cancer Patients and their Caregivers ? American Cancer Society: Can assist with transportation, wigs, general needs, runs Look Good Feel Better.        226-750-1651 ? Cancer Care: Provides financial assistance, online support groups, medication/co-pay assistance.  1-800-813-HOPE 303-597-2137) ? [place] Assists [place] cancer patients and their families through emotional , educational and financial support.   351-514-9412 ? [place] DSS Where to apply for food stamps, Medicaid and utility assistance. 231 723 2894 ? RCATS: Transportation to medical appointments. 325-232-5290 ? Social Security Administration: May apply for disability if have a Stage IV cancer. 414-551-5605 620-080-6156 ? [employer], Disability and Transit Services: Assists with nutrition, care and transit needs. [phone]

## 2015-10-16 NOTE — Progress Notes (Signed)
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 male is here because of referral by his PCP, Dr. Lynita Lombard, for evaluation of anemia. Justin [name] has stage IV CKD.    Justin [name] is here alone today to review laboratory studies from his last visit.   Justin [name] said that Justin [name] has not had someone check his prostate recently. Justin [name] does not remember who treated his prostate cancer because it was 7 years ago but Justin [name] knows that it was someone in [place].  Justin [name] was told to get some blood drawn today and Justin [name] was not happy about this but agreeable to additional blood work.   Justin [name] still feels tired and dragging. Justin [name] notes that this is his biggest complaint.No change in appetite. No new pain.    MEDICAL HISTORY:  Past Medical History  Diagnosis Date  . Diabetes mellitus   . Hypertension   . Gout   . Cancer [facility]) 2009    Prostate; radiation seeds  . Chronic kidney disease     stage 4  . Anemia     SURGICAL HISTORY: Past Surgical History  Procedure Laterality Date  . Shoulder surgery Left 1994    rotator cuff  . Knee surgery Left 1998    arthroscopy  . Excision bone spurs Bilateral 1989    feet  . Av fistula placement Left 05/31/2015    Procedure: ARTERIOVENOUS (AV) FISTULA CREATION;  Surgeon: Katha Cabal, MD;  Location: [facility] ORS;  Service: Vascular;  Laterality: Left;    SOCIAL HISTORY: Social History   Social History  . Marital Status: Widowed    Spouse Name: N/A  . Number of Children: N/A  . Years of Education: N/A   Occupational History  . Not on file.   Social History Main Topics  . Smoking status: Former Smoker    Quit date: 10/01/1978  .  Smokeless tobacco: Not on file  . Alcohol Use: No  . Drug Use: No  . Sexual Activity: Not on file   Other Topics Concern  . Not on file   Social History Narrative   Girlfriend of 2 years 7 children 31 grandchildren 15 great grandchildren Drove tour buses for 13 years.  Ex smoker, quit smoking about 35-40 years ago. ETOH, none Justin [name] enjoys gambling.   FAMILY HISTORY: Family History  Problem Relation Age of Onset  . Kidney disease Mother   . Alcoholism Father   . Alcoholism Brother    indicated that his mother is deceased. Justin [name] indicated that his father is deceased. Justin [name] indicated that both of his sisters are alive. Justin [name] indicated that his brother is deceased.   Father is deceased of alcoholism Mother is deceased in 34 and was 62 or 40 yo. She died of kidney disease All of his brothers are dead. 1 died of alcoholism and drug abuse. 1 was killed. Justin [name] has 2 sisters living.  ALLERGIES:  has No Known Allergies.  MEDICATIONS:  Current Outpatient Prescriptions  Medication Sig Dispense Refill  . allopurinol (ZYLOPRIM) 100 MG tablet Take 100 mg by mouth every morning.     Marland Kitchen amLODipine (NORVASC) 10 MG tablet Take 10 mg by mouth every morning.     Marland Kitchen aspirin 81  MG tablet Take 81 mg by mouth every morning.    Marland Kitchen atorvastatin (LIPITOR) 40 MG tablet Take 40 mg by mouth every morning. Reported on 10/01/2015    . carvedilol (COREG) 25 MG tablet Take 25 mg by mouth 2 (two) times daily with a meal.      . doxazosin (CARDURA) 8 MG tablet Take 8 mg by mouth at bedtime.      Marland Kitchen glipiZIDE (GLUCOTROL) 5 MG tablet Take 5 mg by mouth 2 (two) times daily before a meal. Reported on 10/01/2015    . hydrALAZINE (APRESOLINE) 50 MG tablet Take 50 mg by mouth 2 (two) times daily.    . insulin glargine (LANTUS) 100 UNIT/ML injection Inject 10 Units into the skin at bedtime.     Marland Kitchen losartan (COZAAR) 100 MG tablet Take 100 mg by mouth daily.      Marland Kitchen omeprazole (PRILOSEC) 20 MG capsule Take 20 mg by mouth every morning.      . potassium chloride (KLOR-CON) 10 MEQ CR tablet Take 10 mEq by mouth every morning. Reported on 10/01/2015    . torsemide (DEMADEX) 20 MG tablet Take 20 mg by mouth 2 (two) times daily.    . cyclobenzaprine (FLEXERIL) 5 MG tablet Take 1 tablet (5 mg total) by mouth 3 (three) times daily as needed. (Patient not taking: Reported on 05/31/2015) 30 tablet 0  . HYDROcodone-acetaminophen (NORCO/VICODIN) 5-325 MG per tablet Take 1 tablet by mouth every 6 (six) hours as needed for pain. (Patient not taking: Reported on 10/01/2015) 30 tablet 0  . indomethacin (INDOCIN) 50 MG capsule TAKE ONE CAPSULE BY MOUTH TWICE A DAY AS NEEDED FOR PAIN X5 DAYS  0  . ONETOUCH VERIO test strip USE 1 STRIP VIA METER ONCE A DAY TO CHECK BLOOD SUGAR  1  . predniSONE (DELTASONE) 20 MG tablet Take 2 po QD x 3d then 1 po QD x 4d (Patient not taking: Reported on 05/24/2015) 10 tablet 0  . traMADol (ULTRAM) 50 MG tablet Take 2 tablets (100 mg total) by mouth every 6 (six) hours as needed. (Patient not taking: Reported on 05/24/2015) 16 tablet 0   No current facility-administered medications for this visit.    Review of Systems  Constitutional: Positive for malaise/fatigue.       Persistent fatigue since prostate brachytherapy in 2009  HENT: Negative.   Eyes: Negative.   Respiratory: Negative.   Cardiovascular: Positive for leg swelling.       Persistent Left leg swelling that occurs throughout the day, beginning about one year ago.  Gastrointestinal: Negative.   Genitourinary: Negative.   Musculoskeletal: Positive for back pain and joint pain.       Lower back and left hip pain over the past 4 to 5 years.  Skin: Negative.   Neurological: Negative.   Endo/Heme/Allergies: Negative.   Psychiatric/Behavioral: Negative.   All other systems reviewed and are negative.  14 point ROS was done and is otherwise as detailed above or in HPI   PHYSICAL EXAMINATION: ECOG PERFORMANCE STATUS: 1 - Symptomatic but completely  ambulatory  Filed Vitals:   10/16/15 0941  BP: 139/68  Pulse: 72  Temp: 97.6 F (36.4 C)  Resp: 20   Filed Weights   10/16/15 0941  Weight: 272 lb 4.8 oz (123.514 kg)     Physical Exam  Constitutional: Justin [name] is oriented to person, place, and time and well-developed, well-nourished, and in no distress.  HENT:  Head: Normocephalic and atraumatic.  Mouth/Throat: Oropharynx is clear  and moist.  Eyes: Conjunctivae and EOM are normal. Pupils are equal, round, and reactive to light.  Neck: Normal range of motion. Neck supple.  Cardiovascular: Normal rate and regular rhythm.   Murmur heard. Systolic ejection murmur heard.  Pulmonary/Chest: Effort normal and breath sounds normal.  Abdominal: Soft. Bowel sounds are normal.  Musculoskeletal: Normal range of motion. Justin [name] exhibits edema.  LLE twice the size of the RLE, with chronic skin changes extending to the left knee.   Neurological: Justin [name] is alert and oriented to person, place, and time. Gait normal.  Skin: Skin is warm and dry.  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  Nursing note and vitals reviewed.   LABORATORY DATA:  I have reviewed the data as listed Results for Justin [name], Justin [name] (MRN [id number]) as of 10/16/2015 08:12  Ref. Range 10/01/2015 15:25 10/01/2015 15:25  Comment Unknown Comment   LDH Latest Ref Range: 98-192 U/L 177   Iron Latest Ref Range: 45-182 ug/dL 42 (L)   UIBC Latest Units: ug/dL 182   TIBC Latest Ref Range: 250-450 ug/dL 221 (L)   Saturation Ratios Latest Ref Range: 17.9-39.5 % 19   Folate Latest Ref Range: >5.9 ng/mL 10.7   Vitamin B12 Latest Ref Range: 180-914 pg/mL 599   Total Protein ELP Latest Ref Range: 6.0-8.5 g/dL 7.1 7.2  Albumin ELP Latest Ref Range: 2.9-4.4 g/dL 3.4   Globulin, Total Latest Ref Range: 2.2-3.9 g/dL 3.7   A/G Ratio Latest Ref Range: 0.7-1.7  0.9   Alpha-1-Globulin Latest Ref Range: 0.0-0.4 g/dL 0.2   Alpha-2-Globulin Latest Ref Range: 0.4-1.0 g/dL 0.7   Beta Globulin Latest Ref Range:  0.7-1.3 g/dL 1.0   Gamma Globulin Latest Ref Range: 0.4-1.8 g/dL 1.8   M-SPIKE, % Latest Ref Range: Not Observed g/dL Not Observed   SPE Interp. Unknown Comment   IgG (Immunoglobin G), Serum Latest Ref Range: [phone] mg/dL 1772 (H) 1913 (H)  IgA Latest Ref Range: 61-437 mg/dL 424 429  IgM, Serum Latest Ref Range: 15-143 mg/dL 85 87  WBC Latest Ref Range: 4.0-10.5 K/uL 6.8   RBC Latest Ref Range: 4.22-5.81 MIL/uL 3.34 (L)   Hemoglobin Latest Ref Range: 13.0-17.0 g/dL 9.8 (L)   HCT Latest Ref Range: 39.0-52.0 % 31.5 (L)   MCV Latest Ref Range: 78.0-100.0 fL 94.3   MCH Latest Ref Range: 26.0-34.0 pg 29.3   MCHC Latest Ref Range: 30.0-36.0 g/dL 31.1   RDW Latest Ref Range: 11.5-15.5 % 14.5   Platelets Latest Ref Range: 150-400 K/uL 212   Neutrophils Latest Units: % 50   Lymphocytes Latest Units: % 38   Monocytes Relative Latest Units: % 7   Eosinophil Latest Units: % 5   Basophil Latest Units: % 0   NEUT# Latest Ref Range: 1.7-7.7 K/uL 3.4   Lymphocyte # Latest Ref Range: 0.7-4.0 K/uL 2.6   Monocyte # Latest Ref Range: 0.1-1.0 K/uL 0.5   Eosinophils Absolute Latest Ref Range: 0.0-0.7 K/uL 0.3   Basophils Absolute Latest Ref Range: 0.0-0.1 K/uL 0.0   RBC Morphology Unknown SCHISTOCYTES NOTE...   RBC. 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 Justin [name] has agreed for referral back to XRT in [place].  We discussed that the cause of his anemia is again most likely his CKD, but counts have gotten worse  over the past 6 months. Justin [name] notes Justin [name] has had a GI Evaluation. I am checking a ferritin today. Justin [name] has agreed to [facility] for completeness. If normal we will proceed with aranesp until Justin [name] starts dialysis.   Orders Placed This Encounter  Procedures  . CT Biopsy    Standing Status: Future     Number of Occurrences:      Standing Expiration Date: 10/15/2016    Order Specific Question:  Lab orders requested (DO NOT place separate lab orders, these will be automatically ordered during procedure specimen collection):    Answer:  Surgical Pathology    Order Specific Question:  Reason for Exam (SYMPTOM  OR DIAGNOSIS REQUIRED)    Answer:  anemia, please send flow cytometry and cytogenetics    Order Specific Question:  Preferred imaging location?    Answer:  [facility]  . Ferritin  . Haptoglobin  . Comprehensive metabolic panel    All questions were answered. The patient knows to call the clinic with any problems, questions or concerns.  This document serves as a record of services personally performed by Ancil Linsey, MD. It was created on her behalf by Kandace Blitz, a trained medical scribe. The creation of this record is based on the scribe's personal observations and the provider's statements to them. This document has been checked and approved by the attending provider.  I have reviewed the above documentation for accuracy and completeness, and I agree with the above.  This note was electronically signed.    Molli Hazard, MD  10/16/2015 10:37 AM

## 2015-10-16 NOTE — Progress Notes (Signed)
Referral by [employer]. Records faxed on 4/4

## 2015-10-17 LAB — HAPTOGLOBIN: Haptoglobin: 148 mg/dL (ref 34–200)

## 2015-10-22 ENCOUNTER — Other Ambulatory Visit: Payer: Self-pay | Admitting: Radiology

## 2015-10-23 ENCOUNTER — Other Ambulatory Visit: Payer: Self-pay | Admitting: Physician Assistant

## 2015-10-23 ENCOUNTER — Other Ambulatory Visit: Payer: Self-pay | Admitting: General Surgery

## 2015-10-24 ENCOUNTER — Encounter (HOSPITAL_COMMUNITY): Payer: Self-pay

## 2015-10-24 ENCOUNTER — Ambulatory Visit (HOSPITAL_COMMUNITY)
Admission: RE | Admit: 2015-10-24 | Discharge: 2015-10-24 | Disposition: A | Discharge status: Home / Self Care | Payer: Medicare Other | Source: Ambulatory Visit | Attending: Hematology & Oncology | Admitting: Hematology & Oncology

## 2015-10-24 DIAGNOSIS — Z7982 Long term (current) use of aspirin: Secondary | ICD-10-CM | POA: Insufficient documentation

## 2015-10-24 DIAGNOSIS — D72822 Plasmacytosis: Secondary | ICD-10-CM | POA: Insufficient documentation

## 2015-10-24 DIAGNOSIS — Z794 Long term (current) use of insulin: Secondary | ICD-10-CM | POA: Diagnosis not present

## 2015-10-24 DIAGNOSIS — D7589 Other specified diseases of blood and blood-forming organs: Secondary | ICD-10-CM | POA: Insufficient documentation

## 2015-10-24 DIAGNOSIS — I10 Essential (primary) hypertension: Secondary | ICD-10-CM | POA: Diagnosis not present

## 2015-10-24 DIAGNOSIS — Z87891 Personal history of nicotine dependence: Secondary | ICD-10-CM | POA: Diagnosis not present

## 2015-10-24 DIAGNOSIS — Z841 Family history of disorders of kidney and ureter: Secondary | ICD-10-CM | POA: Insufficient documentation

## 2015-10-24 DIAGNOSIS — I129 Hypertensive chronic kidney disease with stage 1 through stage 4 chronic kidney disease, or unspecified chronic kidney disease: Secondary | ICD-10-CM | POA: Insufficient documentation

## 2015-10-24 DIAGNOSIS — M109 Gout, unspecified: Secondary | ICD-10-CM | POA: Diagnosis not present

## 2015-10-24 DIAGNOSIS — Z79899 Other long term (current) drug therapy: Secondary | ICD-10-CM | POA: Diagnosis not present

## 2015-10-24 DIAGNOSIS — Z811 Family history of alcohol abuse and dependence: Secondary | ICD-10-CM | POA: Insufficient documentation

## 2015-10-24 DIAGNOSIS — Z9889 Other specified postprocedural states: Secondary | ICD-10-CM | POA: Insufficient documentation

## 2015-10-24 DIAGNOSIS — N189 Chronic kidney disease, unspecified: Secondary | ICD-10-CM | POA: Diagnosis not present

## 2015-10-24 DIAGNOSIS — D649 Anemia, unspecified: Secondary | ICD-10-CM | POA: Diagnosis present

## 2015-10-24 DIAGNOSIS — E1122 Type 2 diabetes mellitus with diabetic chronic kidney disease: Secondary | ICD-10-CM | POA: Diagnosis not present

## 2015-10-24 LAB — CBC
HCT: 31.2 % — ABNORMAL LOW (ref 39.0–52.0)
Hemoglobin: 10.2 g/dL — ABNORMAL LOW (ref 13.0–17.0)
MCH: 29.7 pg (ref 26.0–34.0)
MCHC: 32.7 g/dL (ref 30.0–36.0)
MCV: 91 fL (ref 78.0–100.0)
Platelets: 213 10*3/uL (ref 150–400)
RBC: 3.43 MIL/uL — ABNORMAL LOW (ref 4.22–5.81)
RDW: 14.9 % (ref 11.5–15.5)
WBC: 6.6 10*3/uL (ref 4.0–10.5)

## 2015-10-24 LAB — PROTIME-INR
INR: 1.1 (ref 0.00–1.49)
Prothrombin Time: 14.4 seconds (ref 11.6–15.2)

## 2015-10-24 LAB — APTT: aPTT: 31 seconds (ref 24–37)

## 2015-10-24 LAB — GLUCOSE, CAPILLARY: Glucose-Capillary: 93 mg/dL (ref 65–99)

## 2015-10-24 LAB — BONE MARROW EXAM

## 2015-10-24 MED ORDER — MIDAZOLAM HCL 2 MG/2ML IJ SOLN
INTRAMUSCULAR | Status: AC
  Filled 2015-10-24: qty 4

## 2015-10-24 MED ORDER — FENTANYL CITRATE (PF) 100 MCG/2ML IJ SOLN
INTRAMUSCULAR | Status: AC
  Filled 2015-10-24: qty 2

## 2015-10-24 MED ORDER — SODIUM CHLORIDE 0.9 % IV SOLN
Freq: Once | INTRAVENOUS | Status: AC
  Administered 2015-10-24: 07:00:00 via INTRAVENOUS

## 2015-10-24 MED ORDER — FENTANYL CITRATE (PF) 100 MCG/2ML IJ SOLN
INTRAMUSCULAR | Status: AC | PRN
  Administered 2015-10-24: 25 ug via INTRAVENOUS
  Administered 2015-10-24: 50 ug via INTRAVENOUS

## 2015-10-24 MED ORDER — MIDAZOLAM HCL 2 MG/2ML IJ SOLN
INTRAMUSCULAR | Status: AC | PRN
  Administered 2015-10-24: 1 mg via INTRAVENOUS
  Administered 2015-10-24 (×2): 0.5 mg via INTRAVENOUS

## 2015-10-24 NOTE — Sedation Documentation (Signed)
Patient denies pain and is resting comfortably.  

## 2015-10-24 NOTE — Procedures (Signed)
CT guided bone marrow biopsy.  3 aspirates and 1 core. Minimal blood loss and no immediate complication.

## 2015-10-24 NOTE — Discharge Instructions (Signed)
Bone Marrow Aspiration and Bone Marrow Biopsy, Care After Refer to this sheet in the next few weeks. These instructions provide you with information about caring for yourself after your procedure. Your health care provider may also give you more specific instructions. Your treatment has been planned according to current medical practices, but problems sometimes occur. Call your health care provider if you have any problems or questions after your procedure. WHAT TO EXPECT AFTER THE PROCEDURE After your procedure, it is common to have:  Soreness or tenderness around the puncture site.  Bruising. HOME CARE INSTRUCTIONS  Take medicines only as directed by your health care provider.  Follow your health care provider's instructions about:  Puncture site care.  Bandage (dressing) changes and removal.  Bathe and shower as directed by your health care provider.  Check your puncture site every day for signs of infection. Watch for:  Redness, swelling, or pain.  Fluid, blood, or pus.  Return to your normal activities as directed by your health care provider.  Keep all follow-up visits as directed by your health care provider. This is important. SEEK MEDICAL CARE IF:  You have a fever.  You have uncontrollable bleeding.  You have redness, swelling, or pain at the site of your puncture.  You have fluid, blood, or pus coming from your puncture site.   This information is not intended to replace advice given to you by your health care provider. Make sure you discuss any questions you have with your health care provider.   Document Released: 01/16/2005 Document Revised: 11/13/2014 Document Reviewed: 06/20/2014 Elsevier Interactive Patient Education 2016 [employer] Moderate Conscious Sedation, Adult, Care After Refer to this sheet in the next few weeks. These instructions provide you with information on caring for yourself after your procedure. Your health care provider may also give  you more specific instructions. Your treatment has been planned according to current medical practices, but problems sometimes occur. Call your health care provider if you have any problems or questions after your procedure. WHAT TO EXPECT AFTER THE PROCEDURE  After your procedure:  You may feel sleepy, clumsy, and have poor balance for several hours.  Vomiting may occur if you eat too soon after the procedure. HOME CARE INSTRUCTIONS  Do not participate in any activities where you could become injured for at least 24 hours. Do not:  Drive.  Swim.  Ride a bicycle.  Operate heavy machinery.  Cook.  Use power tools.  Climb ladders.  Work from a high place.  Do not make important decisions or sign legal documents until you are improved.  If you vomit, drink water, juice, or soup when you can drink without vomiting. Make sure you have little or no nausea before eating solid foods.  Only take over-the-counter or prescription medicines for pain, discomfort, or fever as directed by your health care provider.  Make sure you and your family fully understand everything about the medicines given to you, including what side effects may occur.  You should not drink alcohol, take sleeping pills, or take medicines that cause drowsiness for at least 24 hours.  If you smoke, do not smoke without supervision.  If you are feeling better, you may resume normal activities 24 hours after you were sedated.  Keep all appointments with your health care provider. SEEK MEDICAL CARE IF:  Your skin is pale or bluish in color.  You continue to feel nauseous or vomit.  Your pain is getting worse and is not helped by medicine.  You have bleeding or swelling.  You are still sleepy or feeling clumsy after 24 hours. SEEK IMMEDIATE MEDICAL CARE IF:  You develop a rash.  You have difficulty breathing.  You develop any type of allergic problem.  You have a fever. MAKE SURE YOU:  Understand  these instructions.  Will watch your condition.  Will get help right away if you are not doing well or get worse.   This information is not intended to replace advice given to you by your health care provider. Make sure you discuss any questions you have with your health care provider.   Document Released: 04/19/2013 Document Revised: 07/20/2014 Document Reviewed: 04/19/2013 Elsevier Interactive Patient Education [employer].

## 2015-10-24 NOTE — H&P (Signed)
Chief Complaint: anemia  Referring Physician:Dr. Ancil Linsey  Supervising Physician: Markus Daft  HPI: Justin [name] is an 70 y.o. male who is followed by Dr. Whitney Muse for anemia.  The patient doesn't know much about this and there are no notes in the chart to refer to.  A request has been made for a bone marrow biopsy.  He presents today for this procedure. He has been in his usual state of health otherwise, except for some fatigue and weakness.  Past Medical History:  Past Medical History  Diagnosis Date  . Diabetes mellitus   . Hypertension   . Gout   . Cancer [facility]) 2009    Prostate; radiation seeds  . Chronic kidney disease     stage 4  . Anemia     Past Surgical History:  Past Surgical History  Procedure Laterality Date  . Shoulder surgery Left 1994    rotator cuff  . Knee surgery Left 1998    arthroscopy  . Excision bone spurs Bilateral 1989    feet  . Av fistula placement Left 05/31/2015    Procedure: ARTERIOVENOUS (AV) FISTULA CREATION;  Surgeon: Katha Cabal, MD;  Location: [facility] ORS;  Service: Vascular;  Laterality: Left;    Family History:  Family History  Problem Relation Age of Onset  . Kidney disease Mother   . Alcoholism Father   . Alcoholism Brother     Social History:  reports that he quit smoking about 37 years ago. He does not have any smokeless tobacco history on file. He reports that he does not drink alcohol or use illicit drugs.  Allergies: No Known Allergies  Medications:   Medication List    ASK your doctor about these medications        allopurinol 100 MG tablet  Commonly known as:  ZYLOPRIM  Take 100 mg by mouth every morning.     amLODipine 10 MG tablet  Commonly known as:  NORVASC  Take 10 mg by mouth every morning.     aspirin 81 MG tablet  Take 81 mg by mouth every morning.     atorvastatin 40 MG tablet  Commonly known as:  LIPITOR  Take 40 mg by mouth every morning. Reported on 10/01/2015     carvedilol 25  MG tablet  Commonly known as:  COREG  Take 25 mg by mouth 2 (two) times daily with a meal.     cyclobenzaprine 5 MG tablet  Commonly known as:  FLEXERIL  Take 1 tablet (5 mg total) by mouth 3 (three) times daily as needed.     doxazosin 8 MG tablet  Commonly known as:  CARDURA  Take 8 mg by mouth at bedtime.     glipiZIDE 5 MG tablet  Commonly known as:  GLUCOTROL  Take 5 mg by mouth 2 (two) times daily before a meal. Reported on 10/01/2015     hydrALAZINE 50 MG tablet  Commonly known as:  APRESOLINE  Take 50 mg by mouth 2 (two) times daily.     HYDROcodone-acetaminophen 5-325 MG tablet  Commonly known as:  NORCO/VICODIN  Take 1 tablet by mouth every 6 (six) hours as needed for pain.     indomethacin 50 MG capsule  Commonly known as:  INDOCIN  TAKE ONE CAPSULE BY MOUTH TWICE A DAY AS NEEDED FOR PAIN X5 DAYS     LANTUS 100 UNIT/ML injection  Generic drug:  insulin glargine  Inject 10 Units into the skin at  bedtime.     losartan 100 MG tablet  Commonly known as:  COZAAR  Take 100 mg by mouth daily.     omeprazole 20 MG capsule  Commonly known as:  PRILOSEC  Take 20 mg by mouth every morning.     ONETOUCH VERIO test strip  Generic drug:  glucose blood  USE 1 STRIP VIA METER ONCE A DAY TO CHECK BLOOD SUGAR     potassium chloride 10 MEQ CR tablet  Commonly known as:  KLOR-CON  Take 10 mEq by mouth every morning. Reported on 10/01/2015     predniSONE 20 MG tablet  Commonly known as:  DELTASONE  Take 2 po QD x 3d then 1 po QD x 4d     torsemide 20 MG tablet  Commonly known as:  DEMADEX  Take 20 mg by mouth 2 (two) times daily.     traMADol 50 MG tablet  Commonly known as:  ULTRAM  Take 2 tablets (100 mg total) by mouth every 6 (six) hours as needed.        Please HPI for pertinent positives, otherwise complete 10 system ROS negative.  Mallampati Score: MD Evaluation Airway: WNL Heart: WNL Abdomen: WNL Chest/ Lungs: WNL ASA  Classification:  3 Mallampati/Airway Score: One  Physical Exam: Ht 5' 10.5" (1.791 m)  Wt 260 lb (117.935 kg)  BMI 36.77 kg/m2 Body mass index is 36.77 kg/(m^2). General: pleasant, obese black male who is laying in bed in NAD HEENT: head is normocephalic, atraumatic.  Sclera are noninjected.  PERRL.  Ears and nose without any masses or lesions.  Mouth is pink and moist Heart: regular, rate, and rhythm.  Normal s1,s2. No obvious gallops, or rubs noted.  Palpable radial and pedal pulses bilaterally.  +murmur Lungs: CTAB, no wheezes, rhonchi, or rales noted.  Respiratory effort nonlabored Abd: soft, NT, ND, +BS, no masses or organomegaly.  Reducible umbilical hernia MS: all 4 extremities are symmetrical with no cyanosis, clubbing.  Bilateral LE edema L>R Psych: A&Ox3 with an appropriate affect.   Labs: Results for orders placed or performed during the hospital encounter of 10/24/15 (from the past 48 hour(s))  CBC upon arrival     Status: Abnormal   Collection Time: 10/24/15  7:25 AM  Result Value Ref Range   WBC 6.6 4.0 - 10.5 K/uL   RBC 3.43 (L) 4.22 - 5.81 MIL/uL   Hemoglobin 10.2 (L) 13.0 - 17.0 g/dL   HCT 31.2 (L) 39.0 - 52.0 %   MCV 91.0 78.0 - 100.0 fL   MCH 29.7 26.0 - 34.0 pg   MCHC 32.7 30.0 - 36.0 g/dL   RDW 14.9 11.5 - 15.5 %   Platelets 213 150 - 400 K/uL    Imaging: No results found.  Assessment/Plan 1. Anemia -patient has been NPO and labs and vitals have been reviewed except PT/INR which is still pending. -will proceed with bone marrow biopsy today -Risks and Benefits discussed with the patient including, but not limited to bleeding, infection, damage to adjacent structures or low yield requiring additional tests. All of the patient's questions were answered, patient is agreeable to proceed. Consent signed and in chart.   Thank you for this interesting consult.  I greatly enjoyed meeting DEREKE NEUMANN and look forward to participating in their care.  A copy of this report was  sent to the requesting provider on this date.  Electronically Signed: Henreitta Cea 10/24/2015, 8:28 AM   I spent a total of  30  Minutes  in face to face in clinical consultation, greater than 50% of which was counseling/coordinating care for anemia, needs BMBX

## 2015-11-04 LAB — CHROMOSOME ANALYSIS, BONE MARROW

## 2015-11-15 ENCOUNTER — Encounter (HOSPITAL_COMMUNITY): Payer: Self-pay | Admitting: Hematology & Oncology

## 2015-11-15 ENCOUNTER — Encounter (HOSPITAL_COMMUNITY): Payer: Medicare Other | Attending: Hematology & Oncology | Admitting: Hematology & Oncology

## 2015-11-15 VITALS — BP 163/72 | HR 73 | Temp 98.0°F | Resp 16 | Wt 267.0 lb

## 2015-11-15 DIAGNOSIS — D649 Anemia, unspecified: Secondary | ICD-10-CM | POA: Diagnosis not present

## 2015-11-15 DIAGNOSIS — Z8546 Personal history of malignant neoplasm of prostate: Secondary | ICD-10-CM

## 2015-11-15 DIAGNOSIS — R5382 Chronic fatigue, unspecified: Secondary | ICD-10-CM | POA: Diagnosis not present

## 2015-11-15 DIAGNOSIS — R972 Elevated prostate specific antigen [PSA]: Secondary | ICD-10-CM

## 2015-11-15 DIAGNOSIS — D631 Anemia in chronic kidney disease: Secondary | ICD-10-CM

## 2015-11-15 DIAGNOSIS — N189 Chronic kidney disease, unspecified: Secondary | ICD-10-CM

## 2015-11-15 DIAGNOSIS — D890 Polyclonal hypergammaglobulinemia: Secondary | ICD-10-CM | POA: Diagnosis not present

## 2015-11-15 DIAGNOSIS — N184 Chronic kidney disease, stage 4 (severe): Secondary | ICD-10-CM | POA: Diagnosis not present

## 2015-11-15 NOTE — Patient Instructions (Signed)
[place] at [facility] Discharge Instructions  RECOMMENDATIONS MADE BY THE CONSULTANT AND ANY TEST RESULTS WILL BE SENT TO YOUR REFERRING PHYSICIAN.  Exam done and seen today by Dr. Whitney Muse Will set you up to start Aranesp once approved. Return to see the doctor in 4 months Please call the clinic if you have any questions or concerns  Darbepoetin Alfa injection ARANESP  What is this medicine? DARBEPOETIN ALFA (dar be POE e tin AL fa) helps your body make more red blood cells. It is used to treat anemia caused by chronic kidney failure and chemotherapy. This medicine may be used for other purposes; ask your health care provider or pharmacist if you have questions. What should I tell my health care provider before I take this medicine? They need to know if you have any of these conditions: -blood clotting disorders or history of blood clots -cancer patient not on chemotherapy -cystic fibrosis -heart disease, such as angina, heart failure, or a history of a heart attack -hemoglobin level of 12 g/dL or greater -high blood pressure -low levels of folate, iron, or vitamin B12 -seizures -an unusual or allergic reaction to darbepoetin, erythropoietin, albumin, hamster proteins, latex, other medicines, foods, dyes, or preservatives -pregnant or trying to get pregnant -breast-feeding How should I use this medicine? This medicine is for injection into a vein or under the skin. It is usually given by a health care professional in a hospital or clinic setting. If you get this medicine at home, you will be taught how to prepare and give this medicine. Do not shake the solution before you withdraw a dose. Use exactly as directed. Take your medicine at regular intervals. Do not take your medicine more often than directed. It is important that you put your used needles and syringes in a special sharps container. Do not put them in a trash can. If you do not have a sharps  container, call your pharmacist or healthcare provider to get one. Talk to your pediatrician regarding the use of this medicine in children. While this medicine may be used in children as young as 1 year for selected conditions, precautions do apply. Overdosage: If you think you have taken too much of this medicine contact a poison control center or emergency room at once. NOTE: This medicine is only for you. Do not share this medicine with others. What if I miss a dose? If you miss a dose, take it as soon as you can. If it is almost time for your next dose, take only that dose. Do not take double or extra doses. What may interact with this medicine? Do not take this medicine with any of the following medications: -epoetin alfa This list may not describe all possible interactions. Give your health care provider a list of all the medicines, herbs, non-prescription drugs, or dietary supplements you use. Also tell them if you smoke, drink alcohol, or use illegal drugs. Some items may interact with your medicine. What should I watch for while using this medicine? Visit your prescriber or health care professional for regular checks on your progress and for the needed blood tests and blood pressure measurements. It is especially important for the doctor to make sure your hemoglobin level is in the desired range, to limit the risk of potential side effects and to give you the best benefit. Keep all appointments for any recommended tests. Check your blood pressure as directed. Ask your doctor what your blood pressure should be and  when you should contact him or her. As your body makes more red blood cells, you may need to take iron, folic acid, or vitamin B supplements. Ask your doctor or health care provider which products are right for you. If you have kidney disease continue dietary restrictions, even though this medication can make you feel better. Talk with your doctor or health care professional about the  foods you eat and the vitamins that you take. What side effects may I notice from receiving this medicine? Side effects that you should report to your doctor or health care professional as soon as possible: -allergic reactions like skin rash, itching or hives, swelling of the face, lips, or tongue -breathing problems -changes in vision -chest pain -confusion, trouble speaking or understanding -feeling faint or lightheaded, falls -high blood pressure -muscle aches or pains -pain, swelling, warmth in the leg -rapid weight gain -severe headaches -sudden numbness or weakness of the face, arm or leg -trouble walking, dizziness, loss of balance or coordination -seizures (convulsions) -swelling of the ankles, feet, hands -unusually weak or tired Side effects that usually do not require medical attention (report to your doctor or health care professional if they continue or are bothersome): -diarrhea -fever, chills (flu-like symptoms) -headaches -nausea, vomiting -redness, stinging, or swelling at site where injected This list may not describe all possible side effects. Call your doctor for medical advice about side effects. You may report side effects to FDA at 1-800-FDA-1088. Where should I keep my medicine? Keep out of the reach of children. Store in a refrigerator between 2 and 8 degrees C (36 and 46 degrees F). Do not freeze. Do not shake. Throw away any unused portion if using a single-dose vial. Throw away any unused medicine after the expiration date. NOTE: This sheet is a summary. It may not cover all possible information. If you have questions about this medicine, talk to your doctor, pharmacist, or health care provider.    2016, Elsevier/Gold Standard. (2008-06-12 10:23:57)   Thank you for choosing [place] at [facility] to provide your oncology and hematology care.  To afford each patient quality time with our provider, please arrive at least 15  minutes before your scheduled appointment time.   Beginning January 23rd 2017 lab work for the [facility] will be done in the  Main lab at [facility] on 1st floor. If you have a lab appointment with the [facility] please come in thru the  Main Entrance and check in at the main information desk  You need to re-schedule your appointment should you arrive 10 or more minutes late.  We strive to give you quality time with our providers, and arriving late affects you and other patients whose appointments are after yours.  Also, if you no show three or more times for appointments you may be dismissed from the clinic at the providers discretion.     Again, thank you for choosing [facility].  Our hope is that these requests will decrease the amount of time that you wait before being seen by our physicians.       _____________________________________________________________  Should you have questions after your visit to [facility], please contact our office at [phone] between the hours of 8:30 a.m. and 4:30 p.m.  Voicemails left after 4:30 p.m. will not be returned until the following business day.  For prescription refill requests, have your pharmacy contact our office.  Resources For Cancer Patients and their Caregivers ? American Cancer Society: Can assist with transportation, wigs, general needs, runs Look Good Feel Better.        ([phone] ? Cancer Care: Provides financial assistance, online support groups, medication/co-pay assistance.  1-800-813-HOPE 609-799-4996) ? [place] Assists [place] cancer patients and their families through emotional , educational and financial support.  705-347-5923 ? [place] DSS Where to apply for food stamps, Medicaid and utility assistance. 818 877 9758 ? RCATS: Transportation to medical appointments. 618 701 3804 ? Social Security Administration: May apply for disability if  have a Stage IV cancer. 856-520-1713 (703) [phone] ? [employer], Disability and Transit Services: Assists with nutrition, care and transit needs. [phone]

## 2015-11-15 NOTE — Progress Notes (Signed)
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 male is here because of referral by his PCP, Dr. Lynita Lombard, for evaluation and discussion of anemia. He has CKD. He has marked fatigue. He has a history of prostate cancer treated with brachytherapy, with abnormal PSA.   Justin [name] returns to the [place] today accompanied by his girlfriend.  We discussed the fact that nothing bad was found in his bone marrow, and that his symptoms, fatigue, and physical issues, are likely from his kidney disease.   He has a significant anemia with hgb as low as 8.9 in the recent past.   In general, he doesn't have many questions during his appointment today. Major concern is limiting fatigue. Appetite is unchanged.    MEDICAL HISTORY:  Past Medical History  Diagnosis Date  . Diabetes mellitus   . Hypertension   . Gout   . Chronic kidney disease     stage 4  . Anemia   . Cancer [facility]) 2009    Prostate; radiation seeds    SURGICAL HISTORY: Past Surgical History  Procedure Laterality Date  . Shoulder surgery Left 1994    rotator cuff  . Knee surgery Left 1998    arthroscopy  . Excision bone spurs Bilateral 1989    feet  . Av fistula placement Left 05/31/2015    Procedure: ARTERIOVENOUS (AV) FISTULA CREATION;  Surgeon: Katha Cabal, MD;  Location: [facility] ORS;  Service: Vascular;  Laterality: Left;    SOCIAL HISTORY: Social History   Social History  . Marital Status: Widowed    Spouse Name: N/A  . Number of Children: N/A  . Years of Education: N/A   Occupational History  . Not on file.   Social History Main Topics  . Smoking status:  Former Smoker    Quit date: 10/01/1978  . Smokeless tobacco: Not on file  . Alcohol Use: No  . Drug Use: No  . Sexual Activity: Not on file   Other Topics Concern  . Not on file   Social History Narrative   Girlfriend of 2 years 7 children 31 grandchildren 3 great grandchildren Drove tour buses for 58 years.  Ex smoker, quit smoking about 35-40 years ago. ETOH, none He enjoys gambling.   FAMILY HISTORY: Family History  Problem Relation Age of Onset  . Kidney disease Mother   . Alcoholism Father   . Alcoholism Brother    indicated that his mother is deceased. He indicated that his father is deceased. He indicated that both of his sisters are alive. He indicated that his brother is deceased.   Father is deceased of alcoholism Mother is deceased in 82 and was 10 or 13 yo. She died of kidney disease All of his brothers are dead. 1 died of alcoholism and drug abuse. 1 was killed. He has 2 sisters living.  ALLERGIES:  has No Known Allergies.  MEDICATIONS:  Current Outpatient Prescriptions  Medication Sig Dispense Refill  . allopurinol (ZYLOPRIM) 100 MG tablet Take 100 mg by mouth every morning.     Marland Kitchen amLODipine (NORVASC) 10 MG tablet Take 10 mg by mouth every morning.     Marland Kitchen aspirin 81 MG tablet Take  81 mg by mouth every morning.    Marland Kitchen atorvastatin (LIPITOR) 40 MG tablet Take 40 mg by mouth every morning. Reported on 10/01/2015    . carvedilol (COREG) 25 MG tablet Take 25 mg by mouth 2 (two) times daily with a meal.      . doxazosin (CARDURA) 8 MG tablet Take 8 mg by mouth at bedtime.      Marland Kitchen glipiZIDE (GLUCOTROL) 5 MG tablet Take 5 mg by mouth 2 (two) times daily before a meal. Reported on 10/01/2015    . hydrALAZINE (APRESOLINE) 50 MG tablet Take 50 mg by mouth 2 (two) times daily.    . indomethacin (INDOCIN) 50 MG capsule TAKE ONE CAPSULE BY MOUTH TWICE A DAY AS NEEDED FOR PAIN X5 DAYS  0  . insulin glargine (LANTUS) 100 UNIT/ML injection Inject 10 Units into the skin at  bedtime.     Marland Kitchen losartan (COZAAR) 100 MG tablet Take 100 mg by mouth daily.      Marland Kitchen omeprazole (PRILOSEC) 20 MG capsule Take 20 mg by mouth every morning.     Glory Rosebush VERIO test strip USE 1 STRIP VIA METER ONCE A DAY TO CHECK BLOOD SUGAR  1  . potassium chloride (KLOR-CON) 10 MEQ CR tablet Take 10 mEq by mouth every morning. Reported on 10/01/2015    . torsemide (DEMADEX) 20 MG tablet Take 20 mg by mouth 2 (two) times daily.    . traMADol (ULTRAM) 50 MG tablet Take 2 tablets (100 mg total) by mouth every 6 (six) hours as needed. 16 tablet 0  . cyclobenzaprine (FLEXERIL) 5 MG tablet Take 1 tablet (5 mg total) by mouth 3 (three) times daily as needed. (Patient not taking: Reported on 05/31/2015) 30 tablet 0  . HYDROcodone-acetaminophen (NORCO/VICODIN) 5-325 MG per tablet Take 1 tablet by mouth every 6 (six) hours as needed for pain. (Patient not taking: Reported on 10/01/2015) 30 tablet 0   No current facility-administered medications for this visit.    Review of Systems  Constitutional: Positive for malaise/fatigue.       Persistent fatigue since prostate brachytherapy in 2009  HENT: Negative.   Eyes: Negative.   Respiratory: Negative.   Cardiovascular: Positive for leg swelling.       Persistent Left leg swelling that occurs throughout the day, beginning about one year ago.  Gastrointestinal: Negative.   Genitourinary: Negative.   Musculoskeletal: Positive for back pain and joint pain.       Lower back and left hip pain over the past 4 to 5 years.  Skin: Negative.   Neurological: Negative.   Endo/Heme/Allergies: Negative.   Psychiatric/Behavioral: Negative.   All other systems reviewed and are negative.  14 point ROS was done and is otherwise as detailed above or in HPI    PHYSICAL EXAMINATION: ECOG PERFORMANCE STATUS: 1 - Symptomatic but completely ambulatory  Filed Vitals:   11/15/15 1400  BP: 163/72  Pulse: 73  Temp: 98 F (36.7 C)  Resp: 16   Filed Weights   11/15/15  1400  Weight: 267 lb (121.11 kg)    Physical Exam  Constitutional: He is oriented to person, place, and time and well-developed, well-nourished, and in no distress.  HENT:  Head: Normocephalic and atraumatic.  Nose: Nose normal.  Mouth/Throat: Oropharynx is clear and moist. No oropharyngeal exudate.  Eyes: Conjunctivae and EOM are normal. Pupils are equal, round, and reactive to light. Right eye exhibits no discharge. Left eye exhibits no discharge. No scleral icterus.  Neck:  Normal range of motion. Neck supple. No tracheal deviation present. No thyromegaly present.  Cardiovascular: Normal rate and regular rhythm.  Exam reveals no gallop and no friction rub.   Murmur heard. Systolic ejection murmur heard.  Pulmonary/Chest: Effort normal and breath sounds normal. He has no wheezes. He has no rales.  Abdominal: Soft. Bowel sounds are normal. He exhibits no distension and no mass. There is no tenderness. There is no rebound and no guarding.  Musculoskeletal: Normal range of motion. He exhibits edema.  LLE twice the size of the RLE, with chronic skin changes extending to the left knee.   Lymphadenopathy:    He has no cervical adenopathy.  Neurological: He is alert and oriented to person, place, and time. He has normal reflexes. No cranial nerve deficit. Gait normal. Coordination normal.  Skin: Skin is warm and dry. No rash noted.  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  Psychiatric: Mood, memory, affect and judgment normal.  Nursing note and vitals reviewed.   LABORATORY DATA:  I have reviewed the data as listed Lab Results  Component Value Date   WBC 6.6 10/24/2015   HGB 10.2* 10/24/2015   HCT 31.2* 10/24/2015   MCV 91.0 10/24/2015   PLT 213 10/24/2015   CMP     Component Value Date/Time   NA 146* 10/16/2015 1014   K 4.7 10/16/2015 1014   CL 114* 10/16/2015 1014   CO2 24 10/16/2015 1014   GLUCOSE 91 10/16/2015 1014   BUN 61* 10/16/2015 1014   CREATININE 4.61* 10/16/2015 1014    CALCIUM 8.2* 10/16/2015 1014   PROT 7.4 10/16/2015 1014   ALBUMIN 3.6 10/16/2015 1014   AST 15 10/16/2015 1014   ALT 13* 10/16/2015 1014   ALKPHOS 115 10/16/2015 1014   BILITOT 0.7 10/16/2015 1014   GFRNONAA 12* 10/16/2015 1014   GFRAA 13* 10/16/2015 1014    PATHOLOGY:    ASSESSMENT & PLAN:  Anemia Chronic Fatigue Chronic Kidney 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 He may benefit from more consistent improvement in his H/H and we discussed the role of aranesp today.  He would like to proceed.  I will submit to [facility] for authorization. Goal will be a hgb of 11. Ferritin will be checked every 90 days. CBC Q 2 weeks. Starting dose at 0.75 mcg/kg.  RTC 4 months. Will need to see if patient followed up at Radiation oncology. I have emphasized that he needs to make sure to attend the appointment.  Orders Placed This Encounter  Procedures  . CBC    Standing Status: Standing     Number of Occurrences: 10     Standing Expiration Date: 11/14/2016   All questions were answered. The patient knows to call the clinic with any problems, questions or concerns.  This document serves as a record of services personally performed by Ancil Linsey, MD. It was created on her behalf by Toni Amend, a trained medical scribe. The creation of this record is based on the scribe's personal observations and the provider's statements to them. This document has been checked and approved by the attending provider.  I have reviewed the above documentation for accuracy and completeness, and I agree with the above.  This note was electronically signed.    Molli Hazard, MD  12/03/2015 7:51 AM

## 2015-11-20 ENCOUNTER — Other Ambulatory Visit (HOSPITAL_COMMUNITY): Payer: Self-pay | Admitting: Hematology & Oncology

## 2015-11-20 DIAGNOSIS — N189 Chronic kidney disease, unspecified: Secondary | ICD-10-CM

## 2015-11-20 DIAGNOSIS — D62 Acute posthemorrhagic anemia: Secondary | ICD-10-CM | POA: Insufficient documentation

## 2015-11-20 DIAGNOSIS — D631 Anemia in chronic kidney disease: Secondary | ICD-10-CM | POA: Insufficient documentation

## 2015-11-20 HISTORY — DX: Anemia in chronic kidney disease: N18.9

## 2015-11-20 HISTORY — DX: Anemia in chronic kidney disease: D63.1

## 2015-11-28 ENCOUNTER — Other Ambulatory Visit (HOSPITAL_COMMUNITY): Payer: Medicare Other

## 2015-11-28 ENCOUNTER — Ambulatory Visit (HOSPITAL_COMMUNITY): Payer: Medicare Other

## 2015-11-30 ENCOUNTER — Other Ambulatory Visit (HOSPITAL_COMMUNITY): Payer: Self-pay | Admitting: *Deleted

## 2015-11-30 DIAGNOSIS — N189 Chronic kidney disease, unspecified: Principal | ICD-10-CM

## 2015-11-30 DIAGNOSIS — D631 Anemia in chronic kidney disease: Secondary | ICD-10-CM

## 2015-12-03 ENCOUNTER — Encounter (HOSPITAL_COMMUNITY): Payer: Self-pay | Admitting: Hematology & Oncology

## 2015-12-03 ENCOUNTER — Encounter (HOSPITAL_COMMUNITY): Payer: Medicare Other

## 2015-12-03 ENCOUNTER — Encounter (HOSPITAL_BASED_OUTPATIENT_CLINIC_OR_DEPARTMENT_OTHER): Payer: Medicare Other

## 2015-12-03 VITALS — BP 136/77 | HR 67 | Temp 98.0°F | Resp 16

## 2015-12-03 DIAGNOSIS — N189 Chronic kidney disease, unspecified: Principal | ICD-10-CM

## 2015-12-03 DIAGNOSIS — D631 Anemia in chronic kidney disease: Secondary | ICD-10-CM | POA: Diagnosis not present

## 2015-12-03 DIAGNOSIS — D649 Anemia, unspecified: Secondary | ICD-10-CM | POA: Diagnosis present

## 2015-12-03 DIAGNOSIS — N184 Chronic kidney disease, stage 4 (severe): Secondary | ICD-10-CM | POA: Diagnosis present

## 2015-12-03 LAB — CBC WITH DIFFERENTIAL/PLATELET
Basophils Absolute: 0 10*3/uL (ref 0.0–0.1)
Basophils Relative: 1 %
Eosinophils Absolute: 0.2 10*3/uL (ref 0.0–0.7)
Eosinophils Relative: 2 %
HCT: 33.2 % — ABNORMAL LOW (ref 39.0–52.0)
Hemoglobin: 10.5 g/dL — ABNORMAL LOW (ref 13.0–17.0)
Lymphocytes Relative: 32 %
Lymphs Abs: 2.1 10*3/uL (ref 0.7–4.0)
MCH: 29.5 pg (ref 26.0–34.0)
MCHC: 31.6 g/dL (ref 30.0–36.0)
MCV: 93.3 fL (ref 78.0–100.0)
Monocytes Absolute: 0.4 10*3/uL (ref 0.1–1.0)
Monocytes Relative: 6 %
Neutro Abs: 3.9 10*3/uL (ref 1.7–7.7)
Neutrophils Relative %: 59 %
Platelets: 225 10*3/uL (ref 150–400)
RBC: 3.56 MIL/uL — ABNORMAL LOW (ref 4.22–5.81)
RDW: 14 % (ref 11.5–15.5)
WBC: 6.6 10*3/uL (ref 4.0–10.5)

## 2015-12-03 MED ORDER — DARBEPOETIN ALFA 100 MCG/0.5ML IJ SOSY
PREFILLED_SYRINGE | INTRAMUSCULAR | Status: AC
  Filled 2015-12-03: qty 0.5

## 2015-12-03 MED ORDER — DARBEPOETIN ALFA 100 MCG/0.5ML IJ SOSY
100.0000 ug | PREFILLED_SYRINGE | Freq: Once | INTRAMUSCULAR | Status: AC
  Administered 2015-12-03: 100 ug via SUBCUTANEOUS

## 2015-12-03 NOTE — Patient Instructions (Signed)
[facility] at [facility] Discharge Instructions  RECOMMENDATIONS MADE BY THE CONSULTANT AND ANY TEST RESULTS WILL BE SENT TO YOUR REFERRING PHYSICIAN. Aranesp today.    Thank you for choosing [facility] at [facility] to provide your oncology and hematology care.  To afford each patient quality time with our provider, please arrive at least 15 minutes before your scheduled appointment time.   Beginning January 23rd 2017 lab work for the Cancer Center will be done in the  Main lab at [facility] on 1st floor. If you have a lab appointment with the Cancer Center please come in thru the  Main Entrance and check in at the main information desk  You need to re-schedule your appointment should you arrive 10 or more minutes late.  We strive to give you quality time with our providers, and arriving late affects you and other patients whose appointments are after yours.  Also, if you no show three or more times for appointments you may be dismissed from the clinic at the providers discretion.     Again, thank you for choosing [facility].  Our hope is that these requests will decrease the amount of time that you wait before being seen by our physicians.       _____________________________________________________________  Should you have questions after your visit to [facility], please contact our office at [phone] between the hours of 8:30 a.m. and 4:30 p.m.  Voicemails left after 4:30 p.m. will not be returned until the following business day.  For prescription refill requests, have your pharmacy contact our office.         Resources For Cancer Patients and their Caregivers ? American Cancer Society: Can assist with transportation, wigs, general needs, runs Look Good Feel Better.        1-888-227-6333 ? Cancer Care: Provides financial assistance, online support groups, medication/co-pay assistance.  1-800-813-HOPE  (4673) ? [facility] Assists [place] cancer patients and their families through emotional , educational and financial support.  336-427-4357 ? [place] DSS Where to apply for food stamps, Medicaid and utility assistance. 336-342-1394 ? RCATS: Transportation to medical appointments. 336-347-2287 ? Social Security Administration: May apply for disability if have a Stage IV cancer. 336-342-7796 1-800-772-1213 ? [place] Aging, Disability and Transit Services: Assists with nutrition, care and transit needs. 336-349-2343  Cancer Center Support Programs: @10RELATIVEDAYS@ > Cancer Support Group  2nd Tuesday of the month 1pm-2pm, Journey Room  > Creative Journey  3rd Tuesday of the month 1130am-1pm, Journey Room  > Look Good Feel Better  1st Wednesday of the month 10am-12 noon, Journey Room (Call American Cancer Society to register 1-800-395-5775)    

## 2015-12-03 NOTE — Progress Notes (Signed)
Justin [name] presents today for injection per MD orders. Aranesp 100 mcg administered SQ in left Abdomen. Administration without incident. Patient tolerated well.  

## 2015-12-17 ENCOUNTER — Encounter (HOSPITAL_COMMUNITY): Payer: Self-pay

## 2015-12-17 ENCOUNTER — Encounter (HOSPITAL_COMMUNITY): Payer: Medicare Other

## 2015-12-17 ENCOUNTER — Encounter (HOSPITAL_COMMUNITY): Payer: Medicare Other | Attending: Hematology & Oncology

## 2015-12-17 VITALS — BP 125/62 | HR 64 | Temp 97.9°F | Resp 18

## 2015-12-17 DIAGNOSIS — N184 Chronic kidney disease, stage 4 (severe): Secondary | ICD-10-CM

## 2015-12-17 DIAGNOSIS — N189 Chronic kidney disease, unspecified: Secondary | ICD-10-CM

## 2015-12-17 DIAGNOSIS — D631 Anemia in chronic kidney disease: Secondary | ICD-10-CM

## 2015-12-17 LAB — CBC
HCT: 33.1 % — ABNORMAL LOW (ref 39.0–52.0)
Hemoglobin: 10.2 g/dL — ABNORMAL LOW (ref 13.0–17.0)
MCH: 29.4 pg (ref 26.0–34.0)
MCHC: 30.8 g/dL (ref 30.0–36.0)
MCV: 95.4 fL (ref 78.0–100.0)
Platelets: 199 10*3/uL (ref 150–400)
RBC: 3.47 MIL/uL — ABNORMAL LOW (ref 4.22–5.81)
RDW: 15.2 % (ref 11.5–15.5)
WBC: 5.8 10*3/uL (ref 4.0–10.5)

## 2015-12-17 MED ORDER — DARBEPOETIN ALFA 100 MCG/0.5ML IJ SOSY
100.0000 ug | PREFILLED_SYRINGE | Freq: Once | INTRAMUSCULAR | Status: AC
  Administered 2015-12-17: 100 ug via SUBCUTANEOUS
  Filled 2015-12-17: qty 0.5

## 2015-12-17 NOTE — Patient Instructions (Signed)
[place] at [facility] Discharge Instructions  RECOMMENDATIONS MADE BY THE CONSULTANT AND ANY TEST RESULTS WILL BE SENT TO YOUR REFERRING PHYSICIAN.  Aranesp injection today. Return as scheduled for lab work and injections. Return as scheduled for office visit. Call the clinic should you have any questions or concerns.   Thank you for choosing [place] at [facility] to provide your oncology and hematology care.  To afford each patient quality time with our provider, please arrive at least 15 minutes before your scheduled appointment time.   Beginning January 23rd 2017 lab work for the [facility] will be done in the  Main lab at [facility] on 1st floor. If you have a lab appointment with the [facility] please come in thru the  Main Entrance and check in at the main information desk  You need to re-schedule your appointment should you arrive 10 or more minutes late.  We strive to give you quality time with our providers, and arriving late affects you and other patients whose appointments are after yours.  Also, if you no show three or more times for appointments you may be dismissed from the clinic at the providers discretion.     Again, thank you for choosing [facility].  Our hope is that these requests will decrease the amount of time that you wait before being seen by our physicians.       _____________________________________________________________  Should you have questions after your visit to [facility], please contact our office at [phone] between the hours of 8:30 a.m. and 4:30 p.m.  Voicemails left after 4:30 p.m. will not be returned until the following business day.  For prescription refill requests, have your pharmacy contact our office.         Resources For Cancer Patients and their Caregivers ? American Cancer Society: Can assist with transportation, wigs, general needs, runs Look  Good Feel Better.        830-536-5692 ? Cancer Care: Provides financial assistance, online support groups, medication/co-pay assistance.  1-800-813-HOPE 586-352-4015) ? [facility] Assists [place] cancer patients and their families through emotional , educational and financial support.  918-343-0690 ? [place] DSS Where to apply for food stamps, Medicaid and utility assistance. (878)608-1240 ? RCATS: Transportation to medical appointments. 940 773 6467 ? Social Security Administration: May apply for disability if have a Stage IV cancer. (908)355-2303 3396024444 ? [employer], Disability and Transit Services: Assists with nutrition, care and transit needs. [place] Support Programs: @10RELATIVEDAYS @ > Cancer Support Group  2nd Tuesday of the month 1pm-2pm, Journey Room  > Creative Journey  3rd Tuesday of the month 1130am-1pm, Journey Room  > Look Good Feel Better  1st Wednesday of the month 10am-12 noon, Journey Room (Call [facility] to register [phone])

## 2015-12-17 NOTE — Progress Notes (Signed)
Justin [name] presents today for injection per the provider's orders.  Aranesp administration without incident; see MAR for injection details.  Patient tolerated procedure well and without incident.  No questions or complaints noted at this time. 

## 2015-12-31 ENCOUNTER — Encounter (HOSPITAL_COMMUNITY): Payer: Medicare Other

## 2015-12-31 DIAGNOSIS — N189 Chronic kidney disease, unspecified: Secondary | ICD-10-CM | POA: Diagnosis not present

## 2015-12-31 DIAGNOSIS — D631 Anemia in chronic kidney disease: Secondary | ICD-10-CM

## 2015-12-31 LAB — CBC WITH DIFFERENTIAL/PLATELET
Basophils Absolute: 0 10*3/uL (ref 0.0–0.1)
Basophils Relative: 1 %
Eosinophils Absolute: 0.1 10*3/uL (ref 0.0–0.7)
Eosinophils Relative: 1 %
HCT: 36.3 % — ABNORMAL LOW (ref 39.0–52.0)
Hemoglobin: 11.2 g/dL — ABNORMAL LOW (ref 13.0–17.0)
Lymphocytes Relative: 31 %
Lymphs Abs: 2 10*3/uL (ref 0.7–4.0)
MCH: 29.8 pg (ref 26.0–34.0)
MCHC: 30.9 g/dL (ref 30.0–36.0)
MCV: 96.5 fL (ref 78.0–100.0)
Monocytes Absolute: 0.4 10*3/uL (ref 0.1–1.0)
Monocytes Relative: 7 %
Neutro Abs: 4 10*3/uL (ref 1.7–7.7)
Neutrophils Relative %: 60 %
Platelets: 217 10*3/uL (ref 150–400)
RBC: 3.76 MIL/uL — ABNORMAL LOW (ref 4.22–5.81)
RDW: 15.2 % (ref 11.5–15.5)
WBC: 6.5 10*3/uL (ref 4.0–10.5)

## 2015-12-31 LAB — FERRITIN: Ferritin: 106 ng/mL (ref 24–336)

## 2015-12-31 NOTE — Progress Notes (Signed)
Hemoglobin 11.2. Aranesp not needed today. Pt notified and given calendar for next visit.

## 2016-01-15 ENCOUNTER — Encounter (HOSPITAL_COMMUNITY): Payer: Medicare Other | Attending: Hematology & Oncology

## 2016-01-15 ENCOUNTER — Other Ambulatory Visit (HOSPITAL_COMMUNITY): Payer: Medicare Other

## 2016-01-15 VITALS — BP 152/68 | HR 64 | Resp 16

## 2016-01-15 DIAGNOSIS — N189 Chronic kidney disease, unspecified: Secondary | ICD-10-CM | POA: Insufficient documentation

## 2016-01-15 DIAGNOSIS — D631 Anemia in chronic kidney disease: Secondary | ICD-10-CM | POA: Diagnosis not present

## 2016-01-15 LAB — CBC WITH DIFFERENTIAL/PLATELET
Basophils Absolute: 0 10*3/uL (ref 0.0–0.1)
Basophils Relative: 0 %
Eosinophils Absolute: 0.2 10*3/uL (ref 0.0–0.7)
Eosinophils Relative: 3 %
HCT: 33.7 % — ABNORMAL LOW (ref 39.0–52.0)
Hemoglobin: 10.6 g/dL — ABNORMAL LOW (ref 13.0–17.0)
Lymphocytes Relative: 35 %
Lymphs Abs: 2.4 10*3/uL (ref 0.7–4.0)
MCH: 30.1 pg (ref 26.0–34.0)
MCHC: 31.5 g/dL (ref 30.0–36.0)
MCV: 95.7 fL (ref 78.0–100.0)
Monocytes Absolute: 0.4 10*3/uL (ref 0.1–1.0)
Monocytes Relative: 6 %
Neutro Abs: 3.8 10*3/uL (ref 1.7–7.7)
Neutrophils Relative %: 56 %
Platelets: 183 10*3/uL (ref 150–400)
RBC: 3.52 MIL/uL — ABNORMAL LOW (ref 4.22–5.81)
RDW: 14.4 % (ref 11.5–15.5)
WBC: 6.8 10*3/uL (ref 4.0–10.5)

## 2016-01-15 LAB — FERRITIN: Ferritin: 136 ng/mL (ref 24–336)

## 2016-01-15 MED ORDER — DARBEPOETIN ALFA 100 MCG/0.5ML IJ SOSY
PREFILLED_SYRINGE | INTRAMUSCULAR | Status: AC
  Filled 2016-01-15: qty 0.5

## 2016-01-15 MED ORDER — DARBEPOETIN ALFA 100 MCG/0.5ML IJ SOSY
100.0000 ug | PREFILLED_SYRINGE | Freq: Once | INTRAMUSCULAR | Status: AC
  Administered 2016-01-15: 100 ug via SUBCUTANEOUS

## 2016-01-15 NOTE — Patient Instructions (Signed)
[place] at [facility] Discharge Instructions  RECOMMENDATIONS MADE BY THE CONSULTANT AND ANY TEST RESULTS WILL BE SENT TO YOUR REFERRING PHYSICIAN.  Aranesp 100 mcg given today. Call for any concerns or questions Follow up as scheduled  Thank you for choosing [place] at [facility] to provide your oncology and hematology care.  To afford each patient quality time with our provider, please arrive at least 15 minutes before your scheduled appointment time.   Beginning January 23rd 2017 lab work for the [facility] will be done in the  Main lab at [facility] on 1st floor. If you have a lab appointment with the [facility] please come in thru the  Main Entrance and check in at the main information desk  You need to re-schedule your appointment should you arrive 10 or more minutes late.  We strive to give you quality time with our providers, and arriving late affects you and other patients whose appointments are after yours.  Also, if you no show three or more times for appointments you may be dismissed from the clinic at the providers discretion.     Again, thank you for choosing [facility].  Our hope is that these requests will decrease the amount of time that you wait before being seen by our physicians.       _____________________________________________________________  Should you have questions after your visit to [facility], please contact our office at [phone] between the hours of 8:30 a.m. and 4:30 p.m.  Voicemails left after 4:30 p.m. will not be returned until the following business day.  For prescription refill requests, have your pharmacy contact our office.         Resources For Cancer Patients and their Caregivers ? American Cancer Society: Can assist with transportation, wigs, general needs, runs Look Good Feel Better.        605-543-5167 ? Cancer Care: Provides financial  assistance, online support groups, medication/co-pay assistance.  1-800-813-HOPE (867)619-7718) ? [place] Assists [place] cancer patients and their families through emotional , educational and financial support.  662-634-1019 ? [place] DSS Where to apply for food stamps, Medicaid and utility assistance. (902) 124-9420 ? RCATS: Transportation to medical appointments. 575-740-4082 ? Social Security Administration: May apply for disability if have a Stage IV cancer. (513)850-2867 319-664-8845 ? [employer], Disability and Transit Services: Assists with nutrition, care and transit needs. [place] Support Programs: @10RELATIVEDAYS @ > Cancer Support Group  2nd Tuesday of the month 1pm-2pm, Journey Room  > Creative Journey  3rd Tuesday of the month 1130am-1pm, Journey Room  > Look Good Feel Better  1st Wednesday of the month 10am-12 noon, Journey Room (Call [place] to register [phone])

## 2016-01-15 NOTE — Progress Notes (Signed)
Justin [name] presents today for injection per MD orders. Aranesp 100 mcg administered SQ in left Abdomen. Administration without incident. Patient tolerated well.  

## 2016-01-28 ENCOUNTER — Encounter (HOSPITAL_COMMUNITY): Payer: Medicare Other | Attending: Hematology & Oncology

## 2016-01-28 ENCOUNTER — Encounter (HOSPITAL_COMMUNITY): Payer: Self-pay

## 2016-01-28 ENCOUNTER — Encounter (HOSPITAL_COMMUNITY): Payer: Medicare Other

## 2016-01-28 VITALS — BP 155/74 | HR 60 | Temp 97.7°F | Resp 20

## 2016-01-28 DIAGNOSIS — D631 Anemia in chronic kidney disease: Secondary | ICD-10-CM

## 2016-01-28 DIAGNOSIS — N189 Chronic kidney disease, unspecified: Secondary | ICD-10-CM

## 2016-01-28 LAB — CBC WITH DIFFERENTIAL/PLATELET
Basophils Absolute: 0 10*3/uL (ref 0.0–0.1)
Basophils Relative: 0 %
Eosinophils Absolute: 0.2 10*3/uL (ref 0.0–0.7)
Eosinophils Relative: 2 %
HCT: 33.5 % — ABNORMAL LOW (ref 39.0–52.0)
Hemoglobin: 10.5 g/dL — ABNORMAL LOW (ref 13.0–17.0)
Lymphocytes Relative: 30 %
Lymphs Abs: 2.1 10*3/uL (ref 0.7–4.0)
MCH: 30 pg (ref 26.0–34.0)
MCHC: 31.3 g/dL (ref 30.0–36.0)
MCV: 95.7 fL (ref 78.0–100.0)
Monocytes Absolute: 0.6 10*3/uL (ref 0.1–1.0)
Monocytes Relative: 8 %
Neutro Abs: 4.2 10*3/uL (ref 1.7–7.7)
Neutrophils Relative %: 60 %
Platelets: 174 10*3/uL (ref 150–400)
RBC: 3.5 MIL/uL — ABNORMAL LOW (ref 4.22–5.81)
RDW: 14.9 % (ref 11.5–15.5)
WBC: 7.1 10*3/uL (ref 4.0–10.5)

## 2016-01-28 MED ORDER — DARBEPOETIN ALFA 100 MCG/0.5ML IJ SOSY
100.0000 ug | PREFILLED_SYRINGE | Freq: Once | INTRAMUSCULAR | Status: AC
  Administered 2016-01-28: 100 ug via SUBCUTANEOUS
  Filled 2016-01-28: qty 0.5

## 2016-01-28 NOTE — Patient Instructions (Signed)
[facility] at [facility] Discharge Instructions  RECOMMENDATIONS MADE BY THE CONSULTANT AND ANY TEST RESULTS WILL BE SENT TO YOUR REFERRING PHYSICIAN.  Aranesp injection today. Return as scheduled for lab work and injections. Return as scheduled for office visit.   Thank you for choosing [facility] at [facility] to provide your oncology and hematology care.  To afford each patient quality time with our provider, please arrive at least 15 minutes before your scheduled appointment time.   Beginning January 23rd 2017 lab work for the Cancer Center will be done in the  Main lab at [place] on 1st floor. If you have a lab appointment with the Cancer Center please come in thru the  Main Entrance and check in at the main information desk  You need to re-schedule your appointment should you arrive 10 or more minutes late.  We strive to give you quality time with our providers, and arriving late affects you and other patients whose appointments are after yours.  Also, if you no show three or more times for appointments you may be dismissed from the clinic at the providers discretion.     Again, thank you for choosing [facility].  Our hope is that these requests will decrease the amount of time that you wait before being seen by our physicians.       _____________________________________________________________  Should you have questions after your visit to [facility], please contact our office at [phone] between the hours of 8:30 a.m. and 4:30 p.m.  Voicemails left after 4:30 p.m. will not be returned until the following business day.  For prescription refill requests, have your pharmacy contact our office.         Resources For Cancer Patients and their Caregivers ? American Cancer Society: Can assist with transportation, wigs, general needs, runs Look Good Feel Better.        1-888-227-6333 ? Cancer  Care: Provides financial assistance, online support groups, medication/co-pay assistance.  1-800-813-HOPE (4673) ? [facility] Assists [place] cancer patients and their families through emotional , educational and financial support.  336-427-4357 ? [place] DSS Where to apply for food stamps, Medicaid and utility assistance. 336-342-1394 ? RCATS: Transportation to medical appointments. 336-347-2287 ? Social Security Administration: May apply for disability if have a Stage IV cancer. 336-342-7796 1-800-772-1213 ? [place] Aging, Disability and Transit Services: Assists with nutrition, care and transit needs. 336-349-2343  Cancer Center Support Programs: @10RELATIVEDAYS@ > Cancer Support Group  2nd Tuesday of the month 1pm-2pm, Journey Room  > Creative Journey  3rd Tuesday of the month 1130am-1pm, Journey Room  > Look Good Feel Better  1st Wednesday of the month 10am-12 noon, Journey Room (Call American Cancer Society to register [phone])   

## 2016-01-28 NOTE — Progress Notes (Signed)
1420:  Justin [name] presents today for injection per the provider's orders.  Aranesp administration without incident; see MAR for injection details.  Patient tolerated procedure well and without incident.  No questions or complaints noted at this time.

## 2016-02-11 ENCOUNTER — Encounter (HOSPITAL_COMMUNITY): Payer: Medicare Other | Attending: Hematology & Oncology

## 2016-02-11 DIAGNOSIS — N189 Chronic kidney disease, unspecified: Secondary | ICD-10-CM | POA: Insufficient documentation

## 2016-02-11 DIAGNOSIS — D631 Anemia in chronic kidney disease: Secondary | ICD-10-CM

## 2016-02-11 LAB — CBC WITH DIFFERENTIAL/PLATELET
Basophils Absolute: 0 10*3/uL (ref 0.0–0.1)
Basophils Relative: 0 %
Eosinophils Absolute: 0.2 10*3/uL (ref 0.0–0.7)
Eosinophils Relative: 3 %
HCT: 36 % — ABNORMAL LOW (ref 39.0–52.0)
Hemoglobin: 11.3 g/dL — ABNORMAL LOW (ref 13.0–17.0)
Lymphocytes Relative: 32 %
Lymphs Abs: 2 10*3/uL (ref 0.7–4.0)
MCH: 30.1 pg (ref 26.0–34.0)
MCHC: 31.4 g/dL (ref 30.0–36.0)
MCV: 96 fL (ref 78.0–100.0)
Monocytes Absolute: 0.5 10*3/uL (ref 0.1–1.0)
Monocytes Relative: 7 %
Neutro Abs: 3.8 10*3/uL (ref 1.7–7.7)
Neutrophils Relative %: 58 %
Platelets: 195 10*3/uL (ref 150–400)
RBC: 3.75 MIL/uL — ABNORMAL LOW (ref 4.22–5.81)
RDW: 14.8 % (ref 11.5–15.5)
WBC: 6.4 10*3/uL (ref 4.0–10.5)

## 2016-02-11 NOTE — Progress Notes (Signed)
No injection needed per lab results and treatment parameters.

## 2016-02-25 ENCOUNTER — Ambulatory Visit (HOSPITAL_COMMUNITY): Payer: Medicare Other

## 2016-02-25 ENCOUNTER — Other Ambulatory Visit (HOSPITAL_COMMUNITY): Payer: Medicare Other

## 2016-03-17 ENCOUNTER — Encounter (HOSPITAL_COMMUNITY): Payer: Medicare Other | Attending: Hematology & Oncology | Admitting: Hematology & Oncology

## 2016-03-17 ENCOUNTER — Encounter (HOSPITAL_COMMUNITY): Payer: Self-pay | Admitting: Hematology & Oncology

## 2016-03-17 VITALS — BP 135/60 | HR 69 | Temp 98.1°F | Resp 18 | Wt 274.0 lb

## 2016-03-17 DIAGNOSIS — N184 Chronic kidney disease, stage 4 (severe): Secondary | ICD-10-CM

## 2016-03-17 DIAGNOSIS — D631 Anemia in chronic kidney disease: Secondary | ICD-10-CM | POA: Insufficient documentation

## 2016-03-17 DIAGNOSIS — Z8546 Personal history of malignant neoplasm of prostate: Secondary | ICD-10-CM | POA: Diagnosis not present

## 2016-03-17 DIAGNOSIS — N189 Chronic kidney disease, unspecified: Secondary | ICD-10-CM | POA: Insufficient documentation

## 2016-03-17 DIAGNOSIS — D890 Polyclonal hypergammaglobulinemia: Secondary | ICD-10-CM | POA: Diagnosis not present

## 2016-03-17 NOTE — Patient Instructions (Addendum)
[place] at [facility] Discharge Instructions  RECOMMENDATIONS MADE BY THE CONSULTANT AND ANY TEST RESULTS WILL BE SENT TO YOUR REFERRING PHYSICIAN.  You saw Dr. Whitney Muse today. Follow up in 3 months with Dr. Whitney Muse. Continue Aranesp every 14 days with CBC.   Thank you for choosing [place] at [facility] to provide your oncology and hematology care.  To afford each patient quality time with our provider, please arrive at least 15 minutes before your scheduled appointment time.   Beginning January 23rd 2017 lab work for the [facility] will be done in the  Main lab at [facility] on 1st floor. If you have a lab appointment with the [place] please come in thru the  Main Entrance and check in at the main information desk  You need to re-schedule your appointment should you arrive 10 or more minutes late.  We strive to give you quality time with our providers, and arriving late affects you and other patients whose appointments are after yours.  Also, if you no show three or more times for appointments you may be dismissed from the clinic at the providers discretion.     Again, thank you for choosing [facility].  Our hope is that these requests will decrease the amount of time that you wait before being seen by our physicians.       _____________________________________________________________  Should you have questions after your visit to [facility], please contact our office at [phone] between the hours of 8:30 a.m. and 4:30 p.m.  Voicemails left after 4:30 p.m. will not be returned until the following business day.  For prescription refill requests, have your pharmacy contact our office.         Resources For Cancer Patients and their Caregivers ? American Cancer Society: Can assist with transportation, wigs, general needs, runs Look Good Feel Better.        (323) [phone] ? Cancer  Care: Provides financial assistance, online support groups, medication/co-pay assistance.  1-800-813-HOPE 8168744315) ? [place] Assists [place] cancer patients and their families through emotional , educational and financial support.  787-529-7282 ? [place] DSS Where to apply for food stamps, Medicaid and utility assistance. 361-695-6563 ? RCATS: Transportation to medical appointments. 7693701447 ? Social Security Administration: May apply for disability if have a Stage IV cancer. (351) 179-9942 438 119 2830 ? [employer], Disability and Transit Services: Assists with nutrition, care and transit needs. [place] Support Programs: @10RELATIVEDAYS @ > Cancer Support Group  2nd Tuesday of the month 1pm-2pm, Journey Room  > Creative Journey  3rd Tuesday of the month 1130am-1pm, Journey Room  > Look Good Feel Better  1st Wednesday of the month 10am-12 noon, Journey Room (Call [place] to register [phone])

## 2016-03-17 NOTE — Progress Notes (Signed)
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 male is here for ongoing follow-up of anemia of CKD.  He has stage IV CKD.    Patient is unaccompanied. He says he is feeling better and eating well. Notes an improvement in his energy level. He is still working.   Justin [name] denies any abdominal pain. He is tolerating aranesp   He says he still has some swelling in his legs, but that it is not painful.    His bowels are normal.  He no longer taking insulin. He continues to follow with nephrology.    MEDICAL HISTORY:  Past Medical History:  Diagnosis Date  . Anemia   . Cancer [facility]) 2009   Prostate; radiation seeds  . Chronic kidney disease    stage 4  . Diabetes mellitus   . Gout   . Hypertension     SURGICAL HISTORY: Past Surgical History:  Procedure Laterality Date  . AV FISTULA PLACEMENT Left 05/31/2015   Procedure: ARTERIOVENOUS (AV) FISTULA CREATION;  Surgeon: Katha Cabal, MD;  Location: [facility] ORS;  Service: Vascular;  Laterality: Left;  . excision bone spurs Bilateral 1989   feet  . KNEE SURGERY Left 1998   arthroscopy  . SHOULDER SURGERY Left 1994   rotator cuff    SOCIAL HISTORY: Social History   Social History  . Marital status: Widowed    Spouse name: N/A  . Number of children: N/A  . Years of education: N/A   Occupational History  . Not on file.   Social History Main Topics  . Smoking status: Former Smoker    Quit date: 10/01/1978  . Smokeless tobacco: Never Used  . Alcohol use No  . Drug use: No  . Sexual activity: Not on file   Other Topics Concern  . Not on file   Social History Narrative  . No  narrative on file   Girlfriend of 2 years 7 children 31 grandchildren 77 great grandchildren Drove tour buses for 54 years.  Ex smoker, quit smoking about 35-40 years ago. ETOH, none He enjoys gambling.   FAMILY HISTORY: Family History  Problem Relation Age of Onset  . Kidney disease Mother   . Alcoholism Father   . Alcoholism Brother    indicated that his mother is deceased. He indicated that his father is deceased. He indicated that both of his sisters are alive. He indicated that his brother is deceased.    Father is deceased of alcoholism Mother is deceased in 31 and was 38 or 33 yo. She died of kidney disease All of his brothers are dead. 1 died of alcoholism and drug abuse. 1 was killed. He has 2 sisters living.  ALLERGIES:  has No Known Allergies.  MEDICATIONS:  Current Outpatient Prescriptions  Medication Sig Dispense Refill  . allopurinol (ZYLOPRIM) 100 MG tablet Take 100 mg by mouth every morning.     Marland Kitchen amLODipine (NORVASC) 10 MG tablet Take 10 mg by mouth every morning.     Marland Kitchen aspirin 81 MG tablet Take 81 mg by mouth every morning.    Marland Kitchen atorvastatin (LIPITOR) 40 MG tablet Take 40 mg by mouth every morning. Reported on 10/01/2015    .  carvedilol (COREG) 25 MG tablet Take 25 mg by mouth 2 (two) times daily with a meal.      . cyclobenzaprine (FLEXERIL) 5 MG tablet Take 1 tablet (5 mg total) by mouth 3 (three) times daily as needed. 30 tablet 0  . doxazosin (CARDURA) 8 MG tablet Take 8 mg by mouth at bedtime.      Marland Kitchen glipiZIDE (GLUCOTROL) 5 MG tablet Take 5 mg by mouth 2 (two) times daily before a meal. Reported on 10/01/2015    . hydrALAZINE (APRESOLINE) 50 MG tablet Take 50 mg by mouth 2 (two) times daily.    Marland Kitchen HYDROcodone-acetaminophen (NORCO/VICODIN) 5-325 MG per tablet Take 1 tablet by mouth every 6 (six) hours as needed for pain. 30 tablet 0  . indomethacin (INDOCIN) 50 MG capsule TAKE ONE CAPSULE BY MOUTH TWICE A DAY AS NEEDED FOR PAIN X5 DAYS  0  . insulin  glargine (LANTUS) 100 UNIT/ML injection Inject 10 Units into the skin at bedtime.     Marland Kitchen losartan (COZAAR) 100 MG tablet Take 100 mg by mouth daily.      Marland Kitchen omeprazole (PRILOSEC) 20 MG capsule Take 20 mg by mouth every morning.     Justin [name] VERIO test strip USE 1 STRIP VIA METER ONCE A DAY TO CHECK BLOOD SUGAR  1  . potassium chloride (KLOR-CON) 10 MEQ CR tablet Take 10 mEq by mouth every morning. Reported on 10/01/2015    . torsemide (DEMADEX) 20 MG tablet Take 20 mg by mouth 2 (two) times daily.    . traMADol (ULTRAM) 50 MG tablet Take 2 tablets (100 mg total) by mouth every 6 (six) hours as needed. 16 tablet 0   No current facility-administered medications for this visit.     Review of Systems  HENT: Negative.   Eyes: Negative.   Respiratory: Negative.   Cardiovascular: Positive for leg swelling.       Persistent Left leg swelling that occurs throughout the day, beginning about one year ago.  Gastrointestinal: Negative.   Genitourinary: Negative.   Musculoskeletal: Positive for joint pain.  Skin: Negative.   Neurological: Negative.   Endo/Heme/Allergies: Negative.   Psychiatric/Behavioral: Negative.   All other systems reviewed and are negative. 14 point ROS was done and is otherwise as detailed above or in HPI    PHYSICAL EXAMINATION: ECOG PERFORMANCE STATUS: 1 - Symptomatic but completely ambulatory  Vitals:   03/17/16 1026  BP: 135/60  Pulse: 69  Resp: 18  Temp: 98.1 F (36.7 C)   Filed Weights   03/17/16 1026  Weight: 274 lb (124.3 kg)     Physical Exam  Constitutional: He is oriented to person, place, and time and well-developed, well-nourished, and in no distress.  HENT:  Head: Normocephalic and atraumatic.  Mouth/Throat: Oropharynx is clear and moist.  Eyes: Conjunctivae and EOM are normal. Pupils are equal, round, and reactive to light.  Neck: Normal range of motion. Neck supple.  Cardiovascular: Normal rate and regular rhythm.   Murmur heard. Systolic  ejection murmur heard.  Pulmonary/Chest: Effort normal and breath sounds normal.  Abdominal: Soft. Bowel sounds are normal.  Musculoskeletal: Normal range of motion. He exhibits edema.  LLE twice the size of the RLE, with chronic skin changes extending to the left knee.   Neurological: He is alert and oriented to person, place, and time. Gait normal.  Skin: Skin is warm and dry.  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  Nursing note and vitals reviewed.   LABORATORY DATA:  I have reviewed the data as listed Results for DAYTRON, GULAN (MRN [id number]) as of 03/17/2016 09:25 Results for OREL, WHITMOYER (MRN [id number]) as of 03/17/2016 18:05  Ref. Range 02/11/2016 13:00  WBC Latest Ref Range: 4.0 - 10.5 K/uL 6.4  RBC Latest Ref Range: 4.22 - 5.81 MIL/uL 3.75 (L)  Hemoglobin Latest Ref Range: 13.0 - 17.0 g/dL 11.3 (L)  HCT Latest Ref Range: 39.0 - 52.0 % 36.0 (L)  MCV Latest Ref Range: 78.0 - 100.0 fL 96.0  MCH Latest Ref Range: 26.0 - 34.0 pg 30.1  MCHC Latest Ref Range: 30.0 - 36.0 g/dL 31.4  RDW Latest Ref Range: 11.5 - 15.5 % 14.8  Platelets Latest Ref Range: 150 - 400 K/uL 195  Neutrophils Latest Units: % 58  Lymphocytes Latest Units: % 32  Monocytes Relative Latest Units: % 7  Eosinophil Latest Units: % 3  Basophil Latest Units: % 0  NEUT# Latest Ref Range: 1.7 - 7.7 K/uL 3.8  Lymphocyte # Latest Ref Range: 0.7 - 4.0 K/uL 2.0  Monocyte # Latest Ref Range: 0.1 - 1.0 K/uL 0.5  Eosinophils Absolute Latest Ref Range: 0.0 - 0.7 K/uL 0.2  Basophils Absolute Latest Ref Range: 0.0 - 0.1 K/uL 0.0    RADIOLOGY:  Study Result   INDICATION: 70 year old with anemia, unspecified anemia type. Patient has history of chronic kidney disease.  EXAM: CT GUIDED BONE MARROW ASPIRATES AND BIOPSY  Physician: Stephan Minister. Anselm Pancoast, MD  MEDICATIONS: None.  ANESTHESIA/SEDATION: Fentanyl 2.0 mcg IV; Versed 75 mg IV  Moderate Sedation Time:  15 minutes  The patient was continuously monitored during  the procedure by the interventional radiology nurse under my direct supervision.  COMPLICATIONS: None immediate.  PROCEDURE: The procedure was explained to the patient. The risks and benefits of the procedure were discussed and the patient's questions were addressed. Informed consent was obtained from the patient. The patient was placed prone on CT scan. Images of the pelvis were obtained. The right side of back was prepped and draped in sterile fashion. The skin and right posterior iliac bone were anesthetized with 1% lidocaine. 11 gauge bone needle was directed into the right iliac bone with CT guidance. Three aspirates and one core biopsy obtained. Bandage placed over the puncture site.  FINDINGS: Bilateral pars defects at L5. Bone needle directed in the posterior right ilium.  IMPRESSION: CT guided bone marrow aspirates and core biopsy.   Electronically Signed   By: Markus Daft M.D.   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 His hemoglobin has significantly improved, last ferritin was 136 ng/ml.   Patient has no complains today, other than edema in his lower extremities which is chronic.   Continue with aranesp at current dosing. Ferritin levels every 90 days.  RTC 3 months.   All questions were answered. The patient knows to call the clinic with any problems, questions or concerns.  This document serves as a record of services personally performed by Ancil Linsey, MD. It was created on her behalf by Elmyra Ricks, a trained medical scribe. The creation of this record is based on the scribe's personal observations and the provider's statements to them. This document has been checked and approved by the attending provider.  I have  reviewed the above documentation  for accuracy and completeness, and I agree with the above.  This note was electronically signed.    Molli Hazard, MD  03/17/2016 3:23 PM

## 2016-03-27 ENCOUNTER — Encounter (HOSPITAL_BASED_OUTPATIENT_CLINIC_OR_DEPARTMENT_OTHER): Payer: Medicare Other

## 2016-03-27 ENCOUNTER — Encounter (HOSPITAL_COMMUNITY): Payer: Medicare Other

## 2016-03-27 ENCOUNTER — Encounter (HOSPITAL_COMMUNITY): Payer: Self-pay

## 2016-03-27 DIAGNOSIS — Z23 Encounter for immunization: Secondary | ICD-10-CM | POA: Diagnosis not present

## 2016-03-27 DIAGNOSIS — D631 Anemia in chronic kidney disease: Secondary | ICD-10-CM | POA: Diagnosis present

## 2016-03-27 DIAGNOSIS — N189 Chronic kidney disease, unspecified: Secondary | ICD-10-CM

## 2016-03-27 LAB — CBC WITH DIFFERENTIAL/PLATELET
Basophils Absolute: 0 10*3/uL (ref 0.0–0.1)
Basophils Relative: 0 %
Eosinophils Absolute: 0.2 10*3/uL (ref 0.0–0.7)
Eosinophils Relative: 2 %
HCT: 32.9 % — ABNORMAL LOW (ref 39.0–52.0)
Hemoglobin: 10.4 g/dL — ABNORMAL LOW (ref 13.0–17.0)
Lymphocytes Relative: 27 %
Lymphs Abs: 2 10*3/uL (ref 0.7–4.0)
MCH: 29.8 pg (ref 26.0–34.0)
MCHC: 31.6 g/dL (ref 30.0–36.0)
MCV: 94.3 fL (ref 78.0–100.0)
Monocytes Absolute: 0.4 10*3/uL (ref 0.1–1.0)
Monocytes Relative: 6 %
Neutro Abs: 4.6 10*3/uL (ref 1.7–7.7)
Neutrophils Relative %: 65 %
Platelets: 193 10*3/uL (ref 150–400)
RBC: 3.49 MIL/uL — ABNORMAL LOW (ref 4.22–5.81)
RDW: 14.1 % (ref 11.5–15.5)
WBC: 7.2 10*3/uL (ref 4.0–10.5)

## 2016-03-27 LAB — FERRITIN: Ferritin: 161 ng/mL (ref 24–336)

## 2016-03-27 MED ORDER — DARBEPOETIN ALFA 100 MCG/0.5ML IJ SOSY
100.0000 ug | PREFILLED_SYRINGE | Freq: Once | INTRAMUSCULAR | Status: AC
  Administered 2016-03-27: 100 ug via SUBCUTANEOUS

## 2016-03-27 MED ORDER — INFLUENZA VAC SPLIT QUAD 0.5 ML IM SUSY
PREFILLED_SYRINGE | INTRAMUSCULAR | Status: AC
  Filled 2016-03-27: qty 0.5

## 2016-03-27 MED ORDER — INFLUENZA VAC SPLIT QUAD 0.5 ML IM SUSY
0.5000 mL | PREFILLED_SYRINGE | Freq: Once | INTRAMUSCULAR | Status: AC
  Administered 2016-03-27: 0.5 mL via INTRAMUSCULAR

## 2016-03-27 MED ORDER — DARBEPOETIN ALFA 100 MCG/0.5ML IJ SOSY
PREFILLED_SYRINGE | INTRAMUSCULAR | Status: AC
  Filled 2016-03-27: qty 0.5

## 2016-03-27 NOTE — Patient Instructions (Signed)
[place] at [facility] Discharge Instructions  RECOMMENDATIONS MADE BY THE CONSULTANT AND ANY TEST RESULTS WILL BE SENT TO YOUR REFERRING PHYSICIAN. You were given a flu shot and Aranesp injection today. Return as scheduled.  Thank you for choosing [place] at [facility] to provide your oncology and hematology care.  To afford each patient quality time with our provider, please arrive at least 15 minutes before your scheduled appointment time.   Beginning January 23rd 2017 lab work for the [facility] will be done in the  Main lab at [facility] on 1st floor. If you have a lab appointment with the [place] please come in thru the  Main Entrance and check in at the main information desk  You need to re-schedule your appointment should you arrive 10 or more minutes late.  We strive to give you quality time with our providers, and arriving late affects you and other patients whose appointments are after yours.  Also, if you no show three or more times for appointments you may be dismissed from the clinic at the providers discretion.     Again, thank you for choosing [facility].  Our hope is that these requests will decrease the amount of time that you wait before being seen by our physicians.       _____________________________________________________________  Should you have questions after your visit to [facility], please contact our office at [phone] between the hours of 8:30 a.m. and 4:30 p.m.  Voicemails left after 4:30 p.m. will not be returned until the following business day.  For prescription refill requests, have your pharmacy contact our office.         Resources For Cancer Patients and their Caregivers ? American Cancer Society: Can assist with transportation, wigs, general needs, runs Look Good Feel Better.        ([phone] ? Cancer Care: Provides financial assistance, online  support groups, medication/co-pay assistance.  1-800-813-HOPE 407 716 9049) ? [place] Assists [place] cancer patients and their families through emotional , educational and financial support.  (706)801-7743 ? [place] DSS Where to apply for food stamps, Medicaid and utility assistance. 725-191-6687 ? RCATS: Transportation to medical appointments. 417 503 4512 ? Social Security Administration: May apply for disability if have a Stage IV cancer. 934-001-8234 231-572-6047 ? [employer], Disability and Transit Services: Assists with nutrition, care and transit needs. [place] Support Programs: @10RELATIVEDAYS @ > Cancer Support Group  2nd Tuesday of the month 1pm-2pm, Journey Room  > Creative Journey  3rd Tuesday of the month 1130am-1pm, Journey Room  > Look Good Feel Better  1st Wednesday of the month 10am-12 noon, Journey Room (Call [place] to register [phone])

## 2016-03-27 NOTE — Progress Notes (Signed)
Pt here today for Aranesp injection. Pt wanted flu shot as well. Pt given Aranesp in left lower abdomen and flu shot in right deltoid. Pt tolerated both well. Pt stable and discharged home ambulatory.Pt to return as scheduled.

## 2016-04-10 ENCOUNTER — Encounter (HOSPITAL_COMMUNITY): Payer: Medicare Other

## 2016-04-10 ENCOUNTER — Encounter (HOSPITAL_BASED_OUTPATIENT_CLINIC_OR_DEPARTMENT_OTHER): Payer: Medicare Other

## 2016-04-10 VITALS — BP 131/76 | HR 54 | Temp 97.8°F | Resp 20

## 2016-04-10 DIAGNOSIS — N189 Chronic kidney disease, unspecified: Principal | ICD-10-CM

## 2016-04-10 DIAGNOSIS — N184 Chronic kidney disease, stage 4 (severe): Secondary | ICD-10-CM | POA: Diagnosis not present

## 2016-04-10 DIAGNOSIS — D631 Anemia in chronic kidney disease: Secondary | ICD-10-CM

## 2016-04-10 LAB — CBC WITH DIFFERENTIAL/PLATELET
Basophils Absolute: 0 10*3/uL (ref 0.0–0.1)
Basophils Relative: 1 %
Eosinophils Absolute: 0.1 10*3/uL (ref 0.0–0.7)
Eosinophils Relative: 2 %
HCT: 33.8 % — ABNORMAL LOW (ref 39.0–52.0)
Hemoglobin: 10.6 g/dL — ABNORMAL LOW (ref 13.0–17.0)
Lymphocytes Relative: 31 %
Lymphs Abs: 1.9 10*3/uL (ref 0.7–4.0)
MCH: 29.9 pg (ref 26.0–34.0)
MCHC: 31.4 g/dL (ref 30.0–36.0)
MCV: 95.5 fL (ref 78.0–100.0)
Monocytes Absolute: 0.4 10*3/uL (ref 0.1–1.0)
Monocytes Relative: 7 %
Neutro Abs: 3.5 10*3/uL (ref 1.7–7.7)
Neutrophils Relative %: 59 %
Platelets: 210 10*3/uL (ref 150–400)
RBC: 3.54 MIL/uL — ABNORMAL LOW (ref 4.22–5.81)
RDW: 14.8 % (ref 11.5–15.5)
WBC: 5.9 10*3/uL (ref 4.0–10.5)

## 2016-04-10 LAB — FERRITIN: Ferritin: 121 ng/mL (ref 24–336)

## 2016-04-10 MED ORDER — DARBEPOETIN ALFA 100 MCG/0.5ML IJ SOSY
100.0000 ug | PREFILLED_SYRINGE | Freq: Once | INTRAMUSCULAR | Status: AC
  Administered 2016-04-10: 100 ug via SUBCUTANEOUS

## 2016-04-10 MED ORDER — DARBEPOETIN ALFA 100 MCG/0.5ML IJ SOSY
PREFILLED_SYRINGE | INTRAMUSCULAR | Status: AC
  Filled 2016-04-10: qty 0.5

## 2016-04-10 NOTE — Patient Instructions (Signed)
[place] at [facility] Discharge Instructions  RECOMMENDATIONS MADE BY THE CONSULTANT AND ANY TEST RESULTS WILL BE SENT TO YOUR REFERRING PHYSICIAN.  Hemoglobin 10.6 today. Aranesp 100 mcg injection given as ordered. Return as scheduled.  Thank you for choosing [place] at [facility] to provide your oncology and hematology care.  To afford each patient quality time with our provider, please arrive at least 15 minutes before your scheduled appointment time.   Beginning January 23rd 2017 lab work for the [facility] will be done in the  Main lab at [facility] on 1st floor. If you have a lab appointment with the [place] please come in thru the  Main Entrance and check in at the main information desk  You need to re-schedule your appointment should you arrive 10 or more minutes late.  We strive to give you quality time with our providers, and arriving late affects you and other patients whose appointments are after yours.  Also, if you no show three or more times for appointments you may be dismissed from the clinic at the providers discretion.     Again, thank you for choosing [facility].  Our hope is that these requests will decrease the amount of time that you wait before being seen by our physicians.       _____________________________________________________________  Should you have questions after your visit to [facility], please contact our office at [phone] between the hours of 8:30 a.m. and 4:30 p.m.  Voicemails left after 4:30 p.m. will not be returned until the following business day.  For prescription refill requests, have your pharmacy contact our office.         Resources For Cancer Patients and their Caregivers ? American Cancer Society: Can assist with transportation, wigs, general needs, runs Look Good Feel Better.        3098489435 ? Cancer Care: Provides financial  assistance, online support groups, medication/co-pay assistance.  1-800-813-HOPE (509)683-3653) ? [place] Assists [place] cancer patients and their families through emotional , educational and financial support.  ([phone] ? [place] DSS Where to apply for food stamps, Medicaid and utility assistance. 5042632507 ? RCATS: Transportation to medical appointments. 3014568496 ? Social Security Administration: May apply for disability if have a Stage IV cancer. 629-103-5235 [phone] ? [employer], Disability and Transit Services: Assists with nutrition, care and transit needs. [place] Support Programs: @10RELATIVEDAYS @ > Cancer Support Group  2nd Tuesday of the month 1pm-2pm, Journey Room  > Creative Journey  3rd Tuesday of the month 1130am-1pm, Journey Room  > Look Good Feel Better  1st Wednesday of the month 10am-12 noon, Journey Room (Call [place] to register [phone])

## 2016-04-10 NOTE — Progress Notes (Signed)
Justin [name] presents today for injection per MD orders. Aranesp 100 mcg administered SQ in left Abdomen. Administration without incident. Patient tolerated well.

## 2016-04-24 ENCOUNTER — Other Ambulatory Visit (HOSPITAL_COMMUNITY): Payer: Medicare Other

## 2016-04-24 ENCOUNTER — Ambulatory Visit (HOSPITAL_COMMUNITY): Payer: Medicare Other

## 2016-04-27 ENCOUNTER — Encounter (HOSPITAL_COMMUNITY): Payer: Self-pay

## 2016-04-27 ENCOUNTER — Encounter (HOSPITAL_COMMUNITY): Payer: Medicare Other

## 2016-04-27 ENCOUNTER — Encounter (HOSPITAL_COMMUNITY): Payer: Medicare Other | Attending: Hematology & Oncology

## 2016-04-27 VITALS — BP 125/63 | HR 62 | Temp 97.7°F | Resp 18 | Wt 271.6 lb

## 2016-04-27 DIAGNOSIS — N189 Chronic kidney disease, unspecified: Secondary | ICD-10-CM | POA: Insufficient documentation

## 2016-04-27 DIAGNOSIS — D631 Anemia in chronic kidney disease: Secondary | ICD-10-CM

## 2016-04-27 LAB — CBC WITH DIFFERENTIAL/PLATELET
Basophils Absolute: 0 10*3/uL (ref 0.0–0.1)
Basophils Relative: 1 %
Eosinophils Absolute: 0.2 10*3/uL (ref 0.0–0.7)
Eosinophils Relative: 2 %
HCT: 34.1 % — ABNORMAL LOW (ref 39.0–52.0)
Hemoglobin: 10.5 g/dL — ABNORMAL LOW (ref 13.0–17.0)
Lymphocytes Relative: 29 %
Lymphs Abs: 1.9 10*3/uL (ref 0.7–4.0)
MCH: 29.7 pg (ref 26.0–34.0)
MCHC: 30.8 g/dL (ref 30.0–36.0)
MCV: 96.3 fL (ref 78.0–100.0)
Monocytes Absolute: 0.5 10*3/uL (ref 0.1–1.0)
Monocytes Relative: 8 %
Neutro Abs: 3.8 10*3/uL (ref 1.7–7.7)
Neutrophils Relative %: 60 %
Platelets: 225 10*3/uL (ref 150–400)
RBC: 3.54 MIL/uL — ABNORMAL LOW (ref 4.22–5.81)
RDW: 14.7 % (ref 11.5–15.5)
WBC: 6.4 10*3/uL (ref 4.0–10.5)

## 2016-04-27 LAB — FERRITIN: Ferritin: 123 ng/mL (ref 24–336)

## 2016-04-27 MED ORDER — DARBEPOETIN ALFA 100 MCG/0.5ML IJ SOSY
100.0000 ug | PREFILLED_SYRINGE | Freq: Once | INTRAMUSCULAR | Status: AC
  Administered 2016-04-27: 100 ug via SUBCUTANEOUS

## 2016-04-27 MED ORDER — DARBEPOETIN ALFA 100 MCG/0.5ML IJ SOSY
PREFILLED_SYRINGE | INTRAMUSCULAR | Status: AC
  Filled 2016-04-27: qty 0.5

## 2016-04-27 NOTE — Patient Instructions (Signed)
[facility] at [facility] Discharge Instructions  RECOMMENDATIONS MADE BY THE CONSULTANT AND ANY TEST RESULTS WILL BE SENT TO YOUR REFERRING PHYSICIAN.  Aranesp given today  Follow up as scheduled  Thank you for choosing [facility] at [facility] to provide your oncology and hematology care.  To afford each patient quality time with our provider, please arrive at least 15 minutes before your scheduled appointment time.   Beginning January 23rd 2017 lab work for the Cancer Center will be done in the  Main lab at [facility] on 1st floor. If you have a lab appointment with the Cancer Center please come in thru the  Main Entrance and check in at the main information desk  You need to re-schedule your appointment should you arrive 10 or more minutes late.  We strive to give you quality time with our providers, and arriving late affects you and other patients whose appointments are after yours.  Also, if you no show three or more times for appointments you may be dismissed from the clinic at the providers discretion.     Again, thank you for choosing [facility].  Our hope is that these requests will decrease the amount of time that you wait before being seen by our physicians.       _____________________________________________________________  Should you have questions after your visit to  Cancer Center, please contact our office at [phone] between the hours of 8:30 a.m. and 4:30 p.m.  Voicemails left after 4:30 p.m. will not be returned until the following business day.  For prescription refill requests, have your pharmacy contact our office.         Resources For Cancer Patients and their Caregivers ? American Cancer Society: Can assist with transportation, wigs, general needs, runs Look Good Feel Better.        1-888-227-6333 ? Cancer Care: Provides financial assistance, online support groups, medication/co-pay  assistance.  1-800-813-HOPE (4673) ? [facility] Assists [place] cancer patients and their families through emotional , educational and financial support.  336-427-4357 ? [place] DSS Where to apply for food stamps, Medicaid and utility assistance. 336-342-1394 ? RCATS: Transportation to medical appointments. 336-347-2287 ? Social Security Administration: May apply for disability if have a Stage IV cancer. 336-342-7796 1-800-772-1213 ? [place] Aging, Disability and Transit Services: Assists with nutrition, care and transit needs. 336-349-2343  Cancer Center Support Programs: @10RELATIVEDAYS@ > Cancer Support Group  2nd Tuesday of the month 1pm-2pm, Journey Room  > Creative Journey  3rd Tuesday of the month 1130am-1pm, Journey Room  > Look Good Feel Better  1st Wednesday of the month 10am-12 noon, Journey Room (Call American Cancer Society to register [phone])    

## 2016-04-27 NOTE — Progress Notes (Signed)
Justin [name] presents today for injection per MD orders. Aranesp 122mcg administered SQ in right Abdomen. Administration without incident. Patient tolerated well.  vtials stable and discharged form clinic in stable condition, ambulatory

## 2016-05-08 ENCOUNTER — Encounter (HOSPITAL_BASED_OUTPATIENT_CLINIC_OR_DEPARTMENT_OTHER): Payer: Medicare Other

## 2016-05-08 ENCOUNTER — Encounter (HOSPITAL_COMMUNITY): Payer: Medicare Other

## 2016-05-08 VITALS — BP 127/57 | HR 100 | Temp 97.8°F | Resp 20

## 2016-05-08 DIAGNOSIS — D631 Anemia in chronic kidney disease: Secondary | ICD-10-CM | POA: Diagnosis not present

## 2016-05-08 DIAGNOSIS — N189 Chronic kidney disease, unspecified: Secondary | ICD-10-CM

## 2016-05-08 LAB — CBC WITH DIFFERENTIAL/PLATELET
Basophils Absolute: 0 10*3/uL (ref 0.0–0.1)
Basophils Relative: 0 %
Eosinophils Absolute: 0.2 10*3/uL (ref 0.0–0.7)
Eosinophils Relative: 2 %
HCT: 34.9 % — ABNORMAL LOW (ref 39.0–52.0)
Hemoglobin: 10.9 g/dL — ABNORMAL LOW (ref 13.0–17.0)
Lymphocytes Relative: 27 %
Lymphs Abs: 1.8 10*3/uL (ref 0.7–4.0)
MCH: 30.4 pg (ref 26.0–34.0)
MCHC: 31.2 g/dL (ref 30.0–36.0)
MCV: 97.5 fL (ref 78.0–100.0)
Monocytes Absolute: 0.5 10*3/uL (ref 0.1–1.0)
Monocytes Relative: 7 %
Neutro Abs: 4.3 10*3/uL (ref 1.7–7.7)
Neutrophils Relative %: 64 %
Platelets: 220 10*3/uL (ref 150–400)
RBC: 3.58 MIL/uL — ABNORMAL LOW (ref 4.22–5.81)
RDW: 15.2 % (ref 11.5–15.5)
WBC: 6.7 10*3/uL (ref 4.0–10.5)

## 2016-05-08 LAB — FERRITIN: Ferritin: 91 ng/mL (ref 24–336)

## 2016-05-08 MED ORDER — DARBEPOETIN ALFA 100 MCG/0.5ML IJ SOSY
100.0000 ug | PREFILLED_SYRINGE | Freq: Once | INTRAMUSCULAR | Status: AC
  Administered 2016-05-08: 100 ug via SUBCUTANEOUS

## 2016-05-08 MED ORDER — DARBEPOETIN ALFA 100 MCG/0.5ML IJ SOSY
PREFILLED_SYRINGE | INTRAMUSCULAR | Status: AC
  Filled 2016-05-08: qty 0.5

## 2016-05-08 NOTE — Patient Instructions (Signed)
[place] at [facility] Discharge Instructions  RECOMMENDATIONS MADE BY THE CONSULTANT AND ANY TEST RESULTS WILL BE SENT TO YOUR REFERRING PHYSICIAN.  Hemoglobin 10.9. Aranesp 100 mcg injection given as ordered. Return as scheduled.  Thank you for choosing [place] at [facility] to provide your oncology and hematology care.  To afford each patient quality time with our provider, please arrive at least 15 minutes before your scheduled appointment time.   Beginning January 23rd 2017 lab work for the [facility] will be done in the  Main lab at [facility] on 1st floor. If you have a lab appointment with the [place] please come in thru the  Main Entrance and check in at the main information desk  You need to re-schedule your appointment should you arrive 10 or more minutes late.  We strive to give you quality time with our providers, and arriving late affects you and other patients whose appointments are after yours.  Also, if you no show three or more times for appointments you may be dismissed from the clinic at the providers discretion.     Again, thank you for choosing [facility].  Our hope is that these requests will decrease the amount of time that you wait before being seen by our physicians.       _____________________________________________________________  Should you have questions after your visit to [facility], please contact our office at [phone] between the hours of 8:30 a.m. and 4:30 p.m.  Voicemails left after 4:30 p.m. will not be returned until the following business day.  For prescription refill requests, have your pharmacy contact our office.         Resources For Cancer Patients and their Caregivers ? American Cancer Society: Can assist with transportation, wigs, general needs, runs Look Good Feel Better.        706-883-4130 ? Cancer Care: Provides financial assistance,  online support groups, medication/co-pay assistance.  1-800-813-HOPE 203-650-4711) ? [place] Assists [place] cancer patients and their families through emotional , educational and financial support.  206 247 1183 ? [place] DSS Where to apply for food stamps, Medicaid and utility assistance. [phone] ? RCATS: Transportation to medical appointments. 267-377-2148 ? Social Security Administration: May apply for disability if have a Stage IV cancer. 939-835-6777 774-298-0121 ? [employer], Disability and Transit Services: Assists with nutrition, care and transit needs. [place] Support Programs: @10RELATIVEDAYS @ > Cancer Support Group  2nd Tuesday of the month 1pm-2pm, Journey Room  > Creative Journey  3rd Tuesday of the month 1130am-1pm, Journey Room  > Look Good Feel Better  1st Wednesday of the month 10am-12 noon, Journey Room (Call [place] to register [phone])

## 2016-05-08 NOTE — Progress Notes (Signed)
Justin [name] presents today for injection per MD orders. Aranesp 100 mcg administered SQ in left Abdomen. Administration without incident. Patient tolerated well.

## 2016-05-15 ENCOUNTER — Other Ambulatory Visit (HOSPITAL_COMMUNITY): Payer: Self-pay | Admitting: Hematology & Oncology

## 2016-05-18 ENCOUNTER — Encounter (HOSPITAL_COMMUNITY): Payer: Medicare Other | Attending: Hematology & Oncology

## 2016-05-18 VITALS — BP 128/56 | HR 58 | Temp 97.7°F | Resp 18 | Wt 275.4 lb

## 2016-05-18 DIAGNOSIS — N189 Chronic kidney disease, unspecified: Secondary | ICD-10-CM | POA: Diagnosis not present

## 2016-05-18 DIAGNOSIS — D631 Anemia in chronic kidney disease: Secondary | ICD-10-CM | POA: Diagnosis not present

## 2016-05-18 MED ORDER — SODIUM CHLORIDE 0.9 % IV SOLN
125.0000 mg | Freq: Once | INTRAVENOUS | Status: AC
  Administered 2016-05-18: 125 mg via INTRAVENOUS
  Filled 2016-05-18: qty 10

## 2016-05-18 NOTE — Patient Instructions (Signed)
[place] at [facility] Discharge Instructions  RECOMMENDATIONS MADE BY THE CONSULTANT AND ANY TEST RESULTS WILL BE SENT TO YOUR REFERRING PHYSICIAN.  Ferric Gluconate 125 mg iron infusion given today as ordered. Return as scheduled.  Thank you for choosing [place] at [facility] to provide your oncology and hematology care.  To afford each patient quality time with our provider, please arrive at least 15 minutes before your scheduled appointment time.   Beginning January 23rd 2017 lab work for the [facility] will be done in the  Main lab at [facility] on 1st floor. If you have a lab appointment with the [place] please come in thru the  Main Entrance and check in at the main information desk  You need to re-schedule your appointment should you arrive 10 or more minutes late.  We strive to give you quality time with our providers, and arriving late affects you and other patients whose appointments are after yours.  Also, if you no show three or more times for appointments you may be dismissed from the clinic at the providers discretion.     Again, thank you for choosing [facility].  Our hope is that these requests will decrease the amount of time that you wait before being seen by our physicians.       _____________________________________________________________  Should you have questions after your visit to [facility], please contact our office at [phone] between the hours of 8:30 a.m. and 4:30 p.m.  Voicemails left after 4:30 p.m. will not be returned until the following business day.  For prescription refill requests, have your pharmacy contact our office.         Resources For Cancer Patients and their Caregivers ? American Cancer Society: Can assist with transportation, wigs, general needs, runs Look Good Feel Better.        [phone] ? Cancer Care: Provides financial assistance,  online support groups, medication/co-pay assistance.  1-800-813-HOPE 361 135 8615) ? [place] Assists [place] cancer patients and their families through emotional , educational and financial support.  947-758-9286 ? [place] DSS Where to apply for food stamps, Medicaid and utility assistance. 438-277-8051 ? RCATS: Transportation to medical appointments. (215) 344-2858 ? Social Security Administration: May apply for disability if have a Stage IV cancer. 520-112-3166 [phone] ? [employer], Disability and Transit Services: Assists with nutrition, care and transit needs. [place] Support Programs: @10RELATIVEDAYS @ > Cancer Support Group  2nd Tuesday of the month 1pm-2pm, Journey Room  > Creative Journey  3rd Tuesday of the month 1130am-1pm, Journey Room  > Look Good Feel Better  1st Wednesday of the month 10am-12 noon, Journey Room (Call [place] to register [phone])

## 2016-05-18 NOTE — Progress Notes (Signed)
Tolerated iron infusion well. Stable and ambulatory on discharge home to self.

## 2016-05-22 ENCOUNTER — Encounter (HOSPITAL_BASED_OUTPATIENT_CLINIC_OR_DEPARTMENT_OTHER): Payer: Medicare Other

## 2016-05-22 ENCOUNTER — Encounter (HOSPITAL_COMMUNITY): Payer: Medicare Other

## 2016-05-22 VITALS — BP 132/67 | HR 68 | Temp 98.1°F | Resp 20

## 2016-05-22 DIAGNOSIS — D631 Anemia in chronic kidney disease: Secondary | ICD-10-CM

## 2016-05-22 DIAGNOSIS — N189 Chronic kidney disease, unspecified: Secondary | ICD-10-CM | POA: Diagnosis not present

## 2016-05-22 LAB — CBC WITH DIFFERENTIAL/PLATELET
Basophils Absolute: 0 10*3/uL (ref 0.0–0.1)
Basophils Relative: 0 %
Eosinophils Absolute: 0.1 10*3/uL (ref 0.0–0.7)
Eosinophils Relative: 2 %
HCT: 34.9 % — ABNORMAL LOW (ref 39.0–52.0)
Hemoglobin: 10.6 g/dL — ABNORMAL LOW (ref 13.0–17.0)
Lymphocytes Relative: 23 %
Lymphs Abs: 1.4 10*3/uL (ref 0.7–4.0)
MCH: 29.9 pg (ref 26.0–34.0)
MCHC: 30.4 g/dL (ref 30.0–36.0)
MCV: 98.6 fL (ref 78.0–100.0)
Monocytes Absolute: 0.5 10*3/uL (ref 0.1–1.0)
Monocytes Relative: 8 %
Neutro Abs: 4.1 10*3/uL (ref 1.7–7.7)
Neutrophils Relative %: 67 %
Platelets: 181 10*3/uL (ref 150–400)
RBC: 3.54 MIL/uL — ABNORMAL LOW (ref 4.22–5.81)
RDW: 14.8 % (ref 11.5–15.5)
WBC: 6.1 10*3/uL (ref 4.0–10.5)

## 2016-05-22 MED ORDER — DARBEPOETIN ALFA 100 MCG/0.5ML IJ SOSY
PREFILLED_SYRINGE | INTRAMUSCULAR | Status: AC
  Filled 2016-05-22: qty 0.5

## 2016-05-22 MED ORDER — DARBEPOETIN ALFA 100 MCG/0.5ML IJ SOSY
100.0000 ug | PREFILLED_SYRINGE | Freq: Once | INTRAMUSCULAR | Status: AC
  Administered 2016-05-22: 100 ug via SUBCUTANEOUS

## 2016-05-22 NOTE — Progress Notes (Signed)
Justin [name] presents today for injection per MD orders. Aranesp 100 mcg administered SQ in left Abdomen. Administration without incident. Patient tolerated well.

## 2016-05-22 NOTE — Patient Instructions (Signed)
[place] at [facility] Discharge Instructions  RECOMMENDATIONS MADE BY THE CONSULTANT AND ANY TEST RESULTS WILL BE SENT TO YOUR REFERRING PHYSICIAN.  Hemoglobin 10.6 today. Aranesp 100 mcg injection given as ordered. Return as scheduled.  Thank you for choosing [place] at [facility] to provide your oncology and hematology care.  To afford each patient quality time with our provider, please arrive at least 15 minutes before your scheduled appointment time.   Beginning January 23rd 2017 lab work for the [facility] will be done in the  Main lab at [facility] on 1st floor. If you have a lab appointment with the [place] please come in thru the  Main Entrance and check in at the main information desk  You need to re-schedule your appointment should you arrive 10 or more minutes late.  We strive to give you quality time with our providers, and arriving late affects you and other patients whose appointments are after yours.  Also, if you no show three or more times for appointments you may be dismissed from the clinic at the providers discretion.     Again, thank you for choosing [facility].  Our hope is that these requests will decrease the amount of time that you wait before being seen by our physicians.       _____________________________________________________________  Should you have questions after your visit to [facility], please contact our office at [phone] between the hours of 8:30 a.m. and 4:30 p.m.  Voicemails left after 4:30 p.m. will not be returned until the following business day.  For prescription refill requests, have your pharmacy contact our office.         Resources For Cancer Patients and their Caregivers ? American Cancer Society: Can assist with transportation, wigs, general needs, runs Look Good Feel Better.        865-272-2135 ? Cancer Care: Provides financial  assistance, online support groups, medication/co-pay assistance.  1-800-813-HOPE 579-612-7527) ? [place] Assists [place] cancer patients and their families through emotional , educational and financial support.  703-596-4099 ? [place] DSS Where to apply for food stamps, Medicaid and utility assistance. [phone] ? RCATS: Transportation to medical appointments. 819-044-2722 ? Social Security Administration: May apply for disability if have a Stage IV cancer. [phone] 570-584-0760 ? [employer], Disability and Transit Services: Assists with nutrition, care and transit needs. [place] Support Programs: @10RELATIVEDAYS @ > Cancer Support Group  2nd Tuesday of the month 1pm-2pm, Journey Room  > Creative Journey  3rd Tuesday of the month 1130am-1pm, Journey Room  > Look Good Feel Better  1st Wednesday of the month 10am-12 noon, Journey Room (Call [place] to register [phone])

## 2016-06-08 ENCOUNTER — Encounter (HOSPITAL_COMMUNITY): Payer: Medicare Other

## 2016-06-08 ENCOUNTER — Encounter (HOSPITAL_COMMUNITY): Payer: Self-pay

## 2016-06-08 ENCOUNTER — Encounter (HOSPITAL_BASED_OUTPATIENT_CLINIC_OR_DEPARTMENT_OTHER): Payer: Medicare Other

## 2016-06-08 VITALS — BP 140/78 | HR 77 | Resp 20

## 2016-06-08 DIAGNOSIS — N189 Chronic kidney disease, unspecified: Secondary | ICD-10-CM

## 2016-06-08 DIAGNOSIS — D631 Anemia in chronic kidney disease: Secondary | ICD-10-CM | POA: Diagnosis not present

## 2016-06-08 LAB — CBC WITH DIFFERENTIAL/PLATELET
Basophils Absolute: 0 10*3/uL (ref 0.0–0.1)
Basophils Relative: 0 %
Eosinophils Absolute: 0.1 10*3/uL (ref 0.0–0.7)
Eosinophils Relative: 2 %
HCT: 34.6 % — ABNORMAL LOW (ref 39.0–52.0)
Hemoglobin: 10.6 g/dL — ABNORMAL LOW (ref 13.0–17.0)
Lymphocytes Relative: 28 %
Lymphs Abs: 1.7 10*3/uL (ref 0.7–4.0)
MCH: 30 pg (ref 26.0–34.0)
MCHC: 30.6 g/dL (ref 30.0–36.0)
MCV: 98 fL (ref 78.0–100.0)
Monocytes Absolute: 0.4 10*3/uL (ref 0.1–1.0)
Monocytes Relative: 6 %
Neutro Abs: 3.9 10*3/uL (ref 1.7–7.7)
Neutrophils Relative %: 64 %
Platelets: 174 10*3/uL (ref 150–400)
RBC: 3.53 MIL/uL — ABNORMAL LOW (ref 4.22–5.81)
RDW: 14.7 % (ref 11.5–15.5)
WBC: 6.2 10*3/uL (ref 4.0–10.5)

## 2016-06-08 MED ORDER — DARBEPOETIN ALFA 100 MCG/0.5ML IJ SOSY
100.0000 ug | PREFILLED_SYRINGE | Freq: Once | INTRAMUSCULAR | Status: AC
  Administered 2016-06-08: 100 ug via SUBCUTANEOUS
  Filled 2016-06-08: qty 0.5

## 2016-06-08 MED ORDER — DARBEPOETIN ALFA 150 MCG/0.3ML IJ SOSY
PREFILLED_SYRINGE | INTRAMUSCULAR | Status: AC
  Filled 2016-06-08: qty 0.3

## 2016-06-08 NOTE — Patient Instructions (Signed)
[facility] at [facility] Discharge Instructions  RECOMMENDATIONS MADE BY THE CONSULTANT AND ANY TEST RESULTS WILL BE SENT TO YOUR REFERRING PHYSICIAN.  Aranesp given today  Follow up as scheduled  Thank you for choosing [facility] at [facility] to provide your oncology and hematology care.  To afford each patient quality time with our provider, please arrive at least 15 minutes before your scheduled appointment time.   Beginning January 23rd 2017 lab work for the Cancer Center will be done in the  Main lab at [place] on 1st floor. If you have a lab appointment with the Cancer Center please come in thru the  Main Entrance and check in at the main information desk  You need to re-schedule your appointment should you arrive 10 or more minutes late.  We strive to give you quality time with our providers, and arriving late affects you and other patients whose appointments are after yours.  Also, if you no show three or more times for appointments you may be dismissed from the clinic at the providers discretion.     Again, thank you for choosing [facility].  Our hope is that these requests will decrease the amount of time that you wait before being seen by our physicians.       _____________________________________________________________  Should you have questions after your visit to [facility], please contact our office at [phone] between the hours of 8:30 a.m. and 4:30 p.m.  Voicemails left after 4:30 p.m. will not be returned until the following business day.  For prescription refill requests, have your pharmacy contact our office.         Resources For Cancer Patients and their Caregivers ? American Cancer Society: Can assist with transportation, wigs, general needs, runs Look Good Feel Better.        1-888-227-6333 ? Cancer Care: Provides financial assistance, online support groups, medication/co-pay  assistance.  1-800-813-HOPE (4673) ? [facility] Assists [place] cancer patients and their families through emotional , educational and financial support.  336-427-4357 ? [place] DSS Where to apply for food stamps, Medicaid and utility assistance. 336-342-1394 ? RCATS: Transportation to medical appointments. 336-347-2287 ? Social Security Administration: May apply for disability if have a Stage IV cancer. 336-342-7796 1-800-772-1213 ? [place] Aging, Disability and Transit Services: Assists with nutrition, care and transit needs. 336-349-2343  Cancer Center Support Programs: @10RELATIVEDAYS@ > Cancer Support Group  2nd Tuesday of the month 1pm-2pm, Journey Room  > Creative Journey  3rd Tuesday of the month 1130am-1pm, Journey Room  > Look Good Feel Better  1st Wednesday of the month 10am-12 noon, Journey Room (Call American Cancer [employer] to register [phone])    

## 2016-06-08 NOTE — Progress Notes (Signed)
Justin [name] presents today for injection per MD orders. Aranesp 142mcg administered SQ in right Abdomen. Administration without incident. Patient tolerated well.   Vitals stable and discharged ambulatory from clinic. Follow up as scheduled.

## 2016-06-16 ENCOUNTER — Ambulatory Visit (HOSPITAL_COMMUNITY): Payer: Medicare Other | Admitting: Hematology & Oncology

## 2016-06-22 ENCOUNTER — Encounter (HOSPITAL_COMMUNITY): Payer: Medicare Other | Attending: Oncology

## 2016-06-22 ENCOUNTER — Encounter (HOSPITAL_COMMUNITY): Payer: Medicare Other

## 2016-06-22 ENCOUNTER — Encounter (HOSPITAL_COMMUNITY): Payer: Self-pay

## 2016-06-22 ENCOUNTER — Ambulatory Visit (HOSPITAL_COMMUNITY): Payer: Medicare Other | Admitting: Hematology & Oncology

## 2016-06-22 VITALS — BP 138/68 | HR 76 | Temp 98.3°F | Resp 18

## 2016-06-22 DIAGNOSIS — N189 Chronic kidney disease, unspecified: Secondary | ICD-10-CM | POA: Diagnosis not present

## 2016-06-22 DIAGNOSIS — N184 Chronic kidney disease, stage 4 (severe): Secondary | ICD-10-CM | POA: Diagnosis present

## 2016-06-22 DIAGNOSIS — D631 Anemia in chronic kidney disease: Secondary | ICD-10-CM

## 2016-06-22 LAB — CBC WITH DIFFERENTIAL/PLATELET
Basophils Absolute: 0 10*3/uL (ref 0.0–0.1)
Basophils Relative: 0 %
Eosinophils Absolute: 0.1 10*3/uL (ref 0.0–0.7)
Eosinophils Relative: 1 %
HCT: 35.7 % — ABNORMAL LOW (ref 39.0–52.0)
Hemoglobin: 11 g/dL — ABNORMAL LOW (ref 13.0–17.0)
Lymphocytes Relative: 13 %
Lymphs Abs: 1.3 10*3/uL (ref 0.7–4.0)
MCH: 30.4 pg (ref 26.0–34.0)
MCHC: 30.8 g/dL (ref 30.0–36.0)
MCV: 98.6 fL (ref 78.0–100.0)
Monocytes Absolute: 0.8 10*3/uL (ref 0.1–1.0)
Monocytes Relative: 8 %
Neutro Abs: 7.9 10*3/uL — ABNORMAL HIGH (ref 1.7–7.7)
Neutrophils Relative %: 78 %
Platelets: 179 10*3/uL (ref 150–400)
RBC: 3.62 MIL/uL — ABNORMAL LOW (ref 4.22–5.81)
RDW: 15 % (ref 11.5–15.5)
WBC: 10.1 10*3/uL (ref 4.0–10.5)

## 2016-06-22 MED ORDER — DARBEPOETIN ALFA 100 MCG/0.5ML IJ SOSY
PREFILLED_SYRINGE | INTRAMUSCULAR | Status: AC
  Filled 2016-06-22: qty 0.5

## 2016-06-22 MED ORDER — DARBEPOETIN ALFA 100 MCG/0.5ML IJ SOSY
100.0000 ug | PREFILLED_SYRINGE | Freq: Once | INTRAMUSCULAR | Status: AC
  Administered 2016-06-22: 100 ug via SUBCUTANEOUS

## 2016-06-22 NOTE — Patient Instructions (Signed)
[place] at [facility] Discharge Instructions  RECOMMENDATIONS MADE BY THE CONSULTANT AND ANY TEST RESULTS WILL BE SENT TO YOUR REFERRING PHYSICIAN. Aranesp today.    Thank you for choosing [place] at [facility] to provide your oncology and hematology care.  To afford each patient quality time with our provider, please arrive at least 15 minutes before your scheduled appointment time.   Beginning January 23rd 2017 lab work for the [facility] will be done in the  Main lab at [facility] on 1st floor. If you have a lab appointment with the [place] please come in thru the  Main Entrance and check in at the main information desk  You need to re-schedule your appointment should you arrive 10 or more minutes late.  We strive to give you quality time with our providers, and arriving late affects you and other patients whose appointments are after yours.  Also, if you no show three or more times for appointments you may be dismissed from the clinic at the providers discretion.     Again, thank you for choosing [facility].  Our hope is that these requests will decrease the amount of time that you wait before being seen by our physicians.       _____________________________________________________________  Should you have questions after your visit to [facility], please contact our office at [phone] between the hours of 8:30 a.m. and 4:30 p.m.  Voicemails left after 4:30 p.m. will not be returned until the following business day.  For prescription refill requests, have your pharmacy contact our office.         Resources For Cancer Patients and their Caregivers ? American Cancer Society: Can assist with transportation, wigs, general needs, runs Look Good Feel Better.        435-328-6648 ? Cancer Care: Provides financial assistance, online support groups, medication/co-pay assistance.  1-800-813-HOPE  (684)232-6404) ? [place] Assists [place] cancer patients and their families through emotional , educational and financial support.  218-701-5660 ? [place] DSS Where to apply for food stamps, Medicaid and utility assistance. 6155352543 ? RCATS: Transportation to medical appointments. [phone]) 002-1372 ? Social Security Administration: May apply for disability if have a Stage IV cancer. [phone] 6175782271 ? [employer], Disability and Transit Services: Assists with nutrition, care and transit needs. [place] Support Programs: @10RELATIVEDAYS @ > Cancer Support Group  2nd Tuesday of the month 1pm-2pm, Journey Room  > Creative Journey  3rd Tuesday of the month 1130am-1pm, Journey Room  > Look Good Feel Better  1st Wednesday of the month 10am-12 noon, Journey Room (Call [place] to register [phone])

## 2016-06-22 NOTE — Progress Notes (Signed)
Justin [name] presents today for injection per MD orders. Aranesp 18mcg administered SQ in right Abdomen. Administration without incident. Patient tolerated well.

## 2016-07-03 ENCOUNTER — Encounter (HOSPITAL_COMMUNITY): Payer: Self-pay | Admitting: Oncology

## 2016-07-03 ENCOUNTER — Encounter (HOSPITAL_BASED_OUTPATIENT_CLINIC_OR_DEPARTMENT_OTHER): Payer: Medicare Other

## 2016-07-03 ENCOUNTER — Encounter (HOSPITAL_BASED_OUTPATIENT_CLINIC_OR_DEPARTMENT_OTHER): Payer: Medicare Other | Admitting: Oncology

## 2016-07-03 VITALS — BP 103/52 | HR 70 | Temp 97.6°F | Resp 18 | Wt 273.4 lb

## 2016-07-03 DIAGNOSIS — N184 Chronic kidney disease, stage 4 (severe): Secondary | ICD-10-CM

## 2016-07-03 DIAGNOSIS — D631 Anemia in chronic kidney disease: Secondary | ICD-10-CM | POA: Diagnosis not present

## 2016-07-03 DIAGNOSIS — N189 Chronic kidney disease, unspecified: Principal | ICD-10-CM

## 2016-07-03 MED ORDER — DARBEPOETIN ALFA 100 MCG/0.5ML IJ SOSY
100.0000 ug | PREFILLED_SYRINGE | Freq: Once | INTRAMUSCULAR | Status: AC
  Administered 2016-07-03: 100 ug via SUBCUTANEOUS

## 2016-07-03 MED ORDER — DARBEPOETIN ALFA 100 MCG/0.5ML IJ SOSY
PREFILLED_SYRINGE | INTRAMUSCULAR | Status: AC
  Filled 2016-07-03: qty 0.5

## 2016-07-03 NOTE — Progress Notes (Signed)
Pt given Aranesp injection today per Kirby Crigler PA-C. Pt tolerated well. Pt stable and discharged home ambulatory.

## 2016-07-03 NOTE — Patient Instructions (Addendum)
[place] at [facility] Discharge Instructions  RECOMMENDATIONS MADE BY THE CONSULTANT AND ANY TEST RESULTS WILL BE SENT TO YOUR REFERRING PHYSICIAN.  You saw Kirby Crigler, PA-C, today. Aranesp today. Labs and Aranesp in 2 weeks. Labs in 4 weeks. Follow up in 3 months. See Amy at checkout for appointments.  Thank you for choosing [place] at [facility] to provide your oncology and hematology care.  To afford each patient quality time with our provider, please arrive at least 15 minutes before your scheduled appointment time.   Beginning January 23rd 2017 lab work for the [facility] will be done in the  Main lab at [facility] on 1st floor. If you have a lab appointment with the [facility] please come in thru the  Main Entrance and check in at the main information desk  You need to re-schedule your appointment should you arrive 10 or more minutes late.  We strive to give you quality time with our providers, and arriving late affects you and other patients whose appointments are after yours.  Also, if you no show three or more times for appointments you may be dismissed from the clinic at the providers discretion.     Again, thank you for choosing [facility].  Our hope is that these requests will decrease the amount of time that you wait before being seen by our physicians.       _____________________________________________________________  Should you have questions after your visit to [facility], please contact our office at [phone] between the hours of 8:30 a.m. and 4:30 p.m.  Voicemails left after 4:30 p.m. will not be returned until the following business day.  For prescription refill requests, have your pharmacy contact our office.         Resources For Cancer Patients and their Caregivers ? American Cancer Society: Can assist with transportation, wigs, general needs, runs Look Good Feel  Better.        [phone] ? Cancer Care: Provides financial assistance, online support groups, medication/co-pay assistance.  1-800-813-HOPE 743-795-2071) ? [place] Assists [place] cancer patients and their families through emotional , educational and financial support.  312-442-9617 ? [place] DSS Where to apply for food stamps, Medicaid and utility assistance. [phone] ? RCATS: Transportation to medical appointments. [phone] ? Social Security Administration: May apply for disability if have a Stage IV cancer. 2348229980 587-328-8098 ? [employer], Disability and Transit Services: Assists with nutrition, care and transit needs. [place] Support Programs: @10RELATIVEDAYS @ > Cancer Support Group  2nd Tuesday of the month 1pm-2pm, Journey Room  > Creative Journey  3rd Tuesday of the month 1130am-1pm, Journey Room  > Look Good Feel Better  1st Wednesday of the month 10am-12 noon, Journey Room (Call [place] to register [phone])

## 2016-07-03 NOTE — Assessment & Plan Note (Addendum)
Anemia of chronic renal disease, Stage IV with history of prostate cancer treated with brachytherapy.  On ESA therapy with excellent response to treatment.  Labs today: CBC diff.  I personally reviewed and went over laboratory results with the patient.  The results are noted within this dictation.  HGB is 11 g/dL.  Aranesp injection today for Hgb < 11.1 g/dL.  Labs every 2 weeks: CBC.  Labs in 4 weeks: CBC, CMET, iron/TIBC, ferritin.  Aranesp every 2 weeks for a Hgb LESS THAN 11.1 g/dL.  Will maintain a ferritin > 100 with IV iron replacement when indicated.  Return in 12 weeks for follow-up and re-evaluation of supportive therapy plan.

## 2016-07-03 NOTE — Progress Notes (Signed)
Justin [name], Justin [name]  Anemia in stage 4 chronic kidney disease [facility]) - Plan: CBC, Ferritin, Iron and TIBC, Comprehensive metabolic panel  CURRENT THERAPY: Aranesp 100 mcg every 2 weeks  INTERVAL HISTORY: Justin [name] 70 y.o. male returns for followup of anemia of chronic renal disease, Stage IV with history of prostate cancer treated with brachytherapy.  He denies any complaints.  He denies any blood in stool, dark stools.  He denies any hematuria and hemoptysis.  He continues with follow with nephrology in [place], [place].  He is tolerating supportive treatment well with Aranesp.  No indication of Aranesp-induced HTN or VTE.  Review of Systems  Constitutional: Negative.  Negative for chills, fever and weight loss.  HENT: Negative.   Eyes: Positive for blurred vision (chronic).  Respiratory: Negative.  Negative for cough, hemoptysis and sputum production.   Cardiovascular: Negative for chest pain, claudication and leg swelling.  Gastrointestinal: Negative.  Negative for abdominal pain, blood in stool, constipation, diarrhea and melena.  Genitourinary: Negative.  Negative for hematuria.  Musculoskeletal: Negative.   Skin: Negative.   Neurological: Negative.   Psychiatric/Behavioral: Negative.     Past Medical History:  Diagnosis Date  . Anemia   . Anemia in chronic kidney disease 11/20/2015  . Cancer [facility]) 2009   Prostate; radiation seeds  . Chronic kidney disease    stage 4  . Diabetes mellitus   . Gout   . Hypertension     Past Surgical History:  Procedure Laterality Date  . AV FISTULA PLACEMENT Left 05/31/2015   Procedure: ARTERIOVENOUS (AV) FISTULA CREATION;  Surgeon: Katha Cabal, MD;  Location: [facility] ORS;  Service: Vascular;  Laterality: Left;  . excision bone spurs Bilateral 1989   feet  . KNEE SURGERY Left 1998   arthroscopy  . SHOULDER SURGERY Left 1994   rotator cuff    Family History    Problem Relation Age of Onset  . Kidney disease Mother   . Alcoholism Father   . Alcoholism Brother     Social History   Social History  . Marital status: Widowed    Spouse name: N/A  . Number of children: N/A  . Years of education: N/A   Social History Main Topics  . Smoking status: Former Smoker    Quit date: 10/01/1978  . Smokeless tobacco: Never Used  . Alcohol use No  . Drug use: No  . Sexual activity: Not Asked   Other Topics Concern  . None   Social History Narrative  . None     PHYSICAL EXAMINATION  ECOG PERFORMANCE STATUS: 1 - Symptomatic but completely ambulatory  Vitals:   07/03/16 1151  BP: (!) 103/52  Pulse: 70  Resp: 18  Temp: 97.6 F (36.4 C)    GENERAL:alert, no distress, well nourished, well developed, comfortable, cooperative, obese and flattened affect, unaccompanied SKIN: skin color, texture, turgor are normal, no rashes or significant lesions HEAD: Normocephalic, No masses, lesions, tenderness or abnormalities EYES: normal, EOMI, Conjunctiva are pink and non-injected EARS: External ears normal OROPHARYNX:lips, buccal mucosa, and tongue normal and mucous membranes are moist  NECK: supple, trachea midline LYMPH:  not examined BREAST:not examined LUNGS: clear to auscultation  HEART: regular rate & rhythm and systolic ejection murmur, heard best at 2nd ICS L/RSB ABDOMEN:abdomen soft, obese and normal bowel sounds BACK: Back symmetric, no curvature. EXTREMITIES:less then 2 second capillary refill, no joint deformities, effusion, or inflammation, no skin  discoloration, no cyanosis  NEURO: alert & oriented x 3 with fluent speech, no focal motor/sensory deficits   LABORATORY DATA: CBC    Component Value Date/Time   WBC 10.1 06/22/2016 0836   RBC 3.62 (L) 06/22/2016 0836   HGB 11.0 (L) 06/22/2016 0836   HCT 35.7 (L) 06/22/2016 0836   PLT 179 06/22/2016 0836   MCV 98.6 06/22/2016 0836   MCH 30.4 06/22/2016 0836   MCHC 30.8 06/22/2016  0836   RDW 15.0 06/22/2016 0836   LYMPHSABS 1.3 06/22/2016 0836   MONOABS 0.8 06/22/2016 0836   EOSABS 0.1 06/22/2016 0836   BASOSABS 0.0 06/22/2016 0836      Chemistry      Component Value Date/Time   NA 146 (H) 10/16/2015 1014   K 4.7 10/16/2015 1014   CL 114 (H) 10/16/2015 1014   CO2 24 10/16/2015 1014   BUN 61 (H) 10/16/2015 1014   CREATININE 4.61 (H) 10/16/2015 1014      Component Value Date/Time   CALCIUM 8.2 (L) 10/16/2015 1014   ALKPHOS 115 10/16/2015 1014   AST 15 10/16/2015 1014   ALT 13 (L) 10/16/2015 1014   BILITOT 0.7 10/16/2015 1014        PENDING LABS:   RADIOGRAPHIC STUDIES:  No results found.   PATHOLOGY:    ASSESSMENT AND PLAN:  Anemia in chronic kidney disease Anemia of chronic renal disease, Stage IV with history of prostate cancer treated with brachytherapy.  On ESA therapy with excellent response to treatment.  Labs today: CBC diff.  I personally reviewed and went over laboratory results with the patient.  The results are noted within this dictation.  HGB is 11 g/dL.  Aranesp injection today for Hgb < 11.1 g/dL.  Labs every 2 weeks: CBC.  Labs in 4 weeks: CBC, CMET, iron/TIBC, ferritin.  Aranesp every 2 weeks for a Hgb LESS THAN 11.1 g/dL.  Will maintain a ferritin > 100 with IV iron replacement when indicated.  Return in 12 weeks for follow-up and re-evaluation of supportive therapy plan.    ORDERS PLACED FOR THIS ENCOUNTER: Orders Placed This Encounter  Procedures  . CBC  . Ferritin  . Iron and TIBC  . Comprehensive metabolic panel    MEDICATIONS PRESCRIBED THIS ENCOUNTER: No orders of the defined types were placed in this encounter.   THERAPY PLAN:  Continue with ESA every 2 weeks with dose modifications as needed.  All questions were answered. The patient knows to call the clinic with any problems, questions or concerns. We can certainly see the patient much sooner if necessary.  Patient and plan discussed with  Dr. Ancil Linsey and she is in agreement with the aforementioned.   This note is electronically signed by: Doy Mince 07/03/2016 3:56 PM

## 2016-07-03 NOTE — Patient Instructions (Signed)
[place] at [facility] Discharge Instructions  RECOMMENDATIONS MADE BY THE CONSULTANT AND ANY TEST RESULTS WILL BE SENT TO YOUR REFERRING PHYSICIAN.  You were given an Aranesp injection today. Return to clinic as scheduled.   Thank you for choosing [place] at [facility] to provide your oncology and hematology care.  To afford each patient quality time with our provider, please arrive at least 15 minutes before your scheduled appointment time.   Beginning January 23rd 2017 lab work for the [facility] will be done in the  Main lab at [facility] on 1st floor. If you have a lab appointment with the [facility] please come in thru the  Main Entrance and check in at the main information desk  You need to re-schedule your appointment should you arrive 10 or more minutes late.  We strive to give you quality time with our providers, and arriving late affects you and other patients whose appointments are after yours.  Also, if you no show three or more times for appointments you may be dismissed from the clinic at the providers discretion.     Again, thank you for choosing [facility].  Our hope is that these requests will decrease the amount of time that you wait before being seen by our physicians.       _____________________________________________________________  Should you have questions after your visit to [facility], please contact our office at [phone] between the hours of 8:30 a.m. and 4:30 p.m.  Voicemails left after 4:30 p.m. will not be returned until the following business day.  For prescription refill requests, have your pharmacy contact our office.         Resources For Cancer Patients and their Caregivers ? American Cancer Society: Can assist with transportation, wigs, general needs, runs Look Good Feel Better.        [phone] ? Cancer Care: Provides financial assistance, online  support groups, medication/co-pay assistance.  1-800-813-HOPE 419-113-1155) ? [place] Assists [place] cancer patients and their families through emotional , educational and financial support.  (972)068-3297 ? [place] DSS Where to apply for food stamps, Medicaid and utility assistance. ([phone] 100-0828 ? RCATS: Transportation to medical appointments. ([phone] ? Social Security Administration: May apply for disability if have a Stage IV cancer. [phone] [phone] ? [employer], Disability and Transit Services: Assists with nutrition, care and transit needs. [place] Support Programs: @10RELATIVEDAYS @ > Cancer Support Group  2nd Tuesday of the month 1pm-2pm, Journey Room  > Creative Journey  3rd Tuesday of the month 1130am-1pm, Journey Room  > Look Good Feel Better  1st Wednesday of the month 10am-12 noon, Journey Room (Call [place] to register [phone])

## 2016-07-07 ENCOUNTER — Ambulatory Visit (HOSPITAL_COMMUNITY): Payer: Medicare Other

## 2016-07-07 ENCOUNTER — Other Ambulatory Visit (HOSPITAL_COMMUNITY): Payer: Medicare Other

## 2016-07-17 ENCOUNTER — Encounter (HOSPITAL_COMMUNITY): Payer: Medicare Other | Attending: Hematology & Oncology

## 2016-07-17 ENCOUNTER — Encounter (HOSPITAL_COMMUNITY): Payer: Self-pay

## 2016-07-17 ENCOUNTER — Encounter (HOSPITAL_BASED_OUTPATIENT_CLINIC_OR_DEPARTMENT_OTHER): Payer: Medicare Other

## 2016-07-17 VITALS — BP 138/63 | HR 89 | Temp 98.5°F | Resp 18

## 2016-07-17 DIAGNOSIS — N184 Chronic kidney disease, stage 4 (severe): Secondary | ICD-10-CM | POA: Insufficient documentation

## 2016-07-17 DIAGNOSIS — D631 Anemia in chronic kidney disease: Secondary | ICD-10-CM | POA: Insufficient documentation

## 2016-07-17 DIAGNOSIS — N189 Chronic kidney disease, unspecified: Principal | ICD-10-CM

## 2016-07-17 LAB — CBC
HCT: 30.6 % — ABNORMAL LOW (ref 39.0–52.0)
Hemoglobin: 9.3 g/dL — ABNORMAL LOW (ref 13.0–17.0)
MCH: 30.4 pg (ref 26.0–34.0)
MCHC: 30.4 g/dL (ref 30.0–36.0)
MCV: 100 fL (ref 78.0–100.0)
Platelets: 250 10*3/uL (ref 150–400)
RBC: 3.06 MIL/uL — ABNORMAL LOW (ref 4.22–5.81)
RDW: 15.7 % — ABNORMAL HIGH (ref 11.5–15.5)
WBC: 6.6 10*3/uL (ref 4.0–10.5)

## 2016-07-17 MED ORDER — DARBEPOETIN ALFA 100 MCG/0.5ML IJ SOSY
100.0000 ug | PREFILLED_SYRINGE | Freq: Once | INTRAMUSCULAR | Status: AC
  Administered 2016-07-17: 100 ug via SUBCUTANEOUS
  Filled 2016-07-17: qty 0.5

## 2016-07-17 NOTE — Progress Notes (Signed)
Justin [name] presents today for injection per the provider's orders.  Aranesp administration without incident; see MAR for injection details.  Patient tolerated procedure well and without incident.  No questions or complaints noted at this time.

## 2016-07-17 NOTE — Patient Instructions (Signed)
[place] at [place] at [facility] Discharge Instructions  RECOMMENDATIONS MADE BY THE CONSULTANT AND ANY TEST RESULTS WILL BE SENT TO YOUR REFERRING PHYSICIAN.  Aranesp injection today (hemoglobin 9.3). Return as scheduled for lab work and injections. Return as scheduled for office visit.    Thank you for choosing [place] at [facility] to provide your oncology and hematology care.  To afford each patient quality time with our provider, please arrive at least 15 minutes before your scheduled appointment time.    If you have a lab appointment with the [facility] please come in thru the  Main Entrance and check in at the main information desk  You need to re-schedule your appointment should you arrive 10 or more minutes late.  We strive to give you quality time with our providers, and arriving late affects you and other patients whose appointments are after yours.  Also, if you no show three or more times for appointments you may be dismissed from the clinic at the providers discretion.     Again, thank you for choosing [facility].  Our hope is that these requests will decrease the amount of time that you wait before being seen by our physicians.       _____________________________________________________________  Should you have questions after your visit to [facility], please contact our office at [phone] between the hours of 8:30 a.m. and 4:30 p.m.  Voicemails left after 4:30 p.m. will not be returned until the following business day.  For prescription refill requests, have your pharmacy contact our office.       Resources For Cancer Patients and their Caregivers ? American Cancer Society: Can assist with transportation, wigs, general needs, runs Look Good Feel Better.        250-350-2337 ? Cancer Care: Provides financial assistance, online support groups, medication/co-pay assistance.  1-800-813-HOPE  (304)019-6075) ? [place] Assists [place] cancer patients and their families through emotional , educational and financial support.  818-374-9285 ? [place] DSS Where to apply for food stamps, Medicaid and utility assistance. 604-491-7842 ? RCATS: Transportation to medical appointments. (320)240-8181 ? Social Security Administration: May apply for disability if have a Stage IV cancer. 310-259-8577 [phone])222-9097 ? [employer], Disability and Transit Services: Assists with nutrition, care and transit needs. [address] Support Programs: @10RELATIVEDAYS @ > Cancer Support Group  2nd Tuesday of the month 1pm-2pm, Journey Room  > Creative Journey  3rd Tuesday of the month 1130am-1pm, Journey Room  > Look Good Feel Better  1st Wednesday of the month 10am-12 noon, Journey Room (Call [place] to register [phone])

## 2016-07-20 ENCOUNTER — Ambulatory Visit (HOSPITAL_COMMUNITY): Payer: Medicare Other

## 2016-07-22 ENCOUNTER — Ambulatory Visit (HOSPITAL_COMMUNITY)
Admission: RE | Admit: 2016-07-22 | Discharge: 2016-07-22 | Disposition: A | Discharge status: Home / Self Care | Payer: Medicare Other | Source: Ambulatory Visit | Attending: Internal Medicine | Admitting: Internal Medicine

## 2016-07-22 ENCOUNTER — Other Ambulatory Visit (HOSPITAL_COMMUNITY): Payer: Self-pay | Admitting: Internal Medicine

## 2016-07-22 DIAGNOSIS — R0602 Shortness of breath: Secondary | ICD-10-CM | POA: Insufficient documentation

## 2016-07-27 ENCOUNTER — Other Ambulatory Visit (HOSPITAL_COMMUNITY): Payer: Medicare Other

## 2016-07-27 ENCOUNTER — Ambulatory Visit (HOSPITAL_COMMUNITY): Payer: Medicare Other

## 2016-07-31 ENCOUNTER — Other Ambulatory Visit (HOSPITAL_COMMUNITY): Payer: Medicare Other

## 2016-07-31 ENCOUNTER — Ambulatory Visit (HOSPITAL_COMMUNITY): Payer: Medicare Other

## 2016-07-31 NOTE — Progress Notes (Deleted)
Patient did not show for appointment. Called and spoke with patient who said he wants to reschedule. Encouraged him to come today because next week is very busy and am not sure when he will get his next appointment. Patient stated he was still in the bed and was not coming out today. Notified scheduler to call patient to reschedule his appt.

## 2016-08-03 ENCOUNTER — Encounter (HOSPITAL_COMMUNITY): Payer: Medicare Other | Attending: Hematology & Oncology

## 2016-08-03 ENCOUNTER — Encounter (HOSPITAL_COMMUNITY): Payer: Medicare Other

## 2016-08-03 VITALS — BP 116/49 | HR 76 | Temp 97.7°F | Resp 18

## 2016-08-03 DIAGNOSIS — N189 Chronic kidney disease, unspecified: Secondary | ICD-10-CM

## 2016-08-03 DIAGNOSIS — D631 Anemia in chronic kidney disease: Secondary | ICD-10-CM

## 2016-08-03 DIAGNOSIS — N184 Chronic kidney disease, stage 4 (severe): Secondary | ICD-10-CM | POA: Insufficient documentation

## 2016-08-03 LAB — COMPREHENSIVE METABOLIC PANEL
ALT: 14 U/L — ABNORMAL LOW (ref 17–63)
AST: 17 U/L (ref 15–41)
Albumin: 3.3 g/dL — ABNORMAL LOW (ref 3.5–5.0)
Alkaline Phosphatase: 85 U/L (ref 38–126)
Anion gap: 9 (ref 5–15)
BUN: 64 mg/dL — ABNORMAL HIGH (ref 6–20)
CO2: 22 mmol/L (ref 22–32)
Calcium: 7.8 mg/dL — ABNORMAL LOW (ref 8.9–10.3)
Chloride: 111 mmol/L (ref 101–111)
Creatinine, Ser: 5.36 mg/dL — ABNORMAL HIGH (ref 0.61–1.24)
GFR calc Af Amer: 11 mL/min — ABNORMAL LOW (ref >60)
GFR calc non Af Amer: 10 mL/min — ABNORMAL LOW (ref >60)
Glucose, Bld: 212 mg/dL — ABNORMAL HIGH (ref 65–99)
Potassium: 4.3 mmol/L (ref 3.5–5.1)
Sodium: 142 mmol/L (ref 135–145)
Total Bilirubin: 0.6 mg/dL (ref 0.3–1.2)
Total Protein: 7.3 g/dL (ref 6.5–8.1)

## 2016-08-03 LAB — CBC
HCT: 29.9 % — ABNORMAL LOW (ref 39.0–52.0)
Hemoglobin: 9.1 g/dL — ABNORMAL LOW (ref 13.0–17.0)
MCH: 29.8 pg (ref 26.0–34.0)
MCHC: 30.4 g/dL (ref 30.0–36.0)
MCV: 98 fL (ref 78.0–100.0)
Platelets: 188 10*3/uL (ref 150–400)
RBC: 3.05 MIL/uL — ABNORMAL LOW (ref 4.22–5.81)
RDW: 16.2 % — ABNORMAL HIGH (ref 11.5–15.5)
WBC: 5.5 10*3/uL (ref 4.0–10.5)

## 2016-08-03 LAB — IRON AND TIBC
Iron: 44 ug/dL — ABNORMAL LOW (ref 45–182)
Saturation Ratios: 20 % (ref 17.9–39.5)
TIBC: 217 ug/dL — ABNORMAL LOW (ref 250–450)
UIBC: 173 ug/dL

## 2016-08-03 LAB — FERRITIN: Ferritin: 142 ng/mL (ref 24–336)

## 2016-08-03 MED ORDER — DARBEPOETIN ALFA 100 MCG/0.5ML IJ SOSY
100.0000 ug | PREFILLED_SYRINGE | Freq: Once | INTRAMUSCULAR | Status: AC
  Administered 2016-08-03: 100 ug via SUBCUTANEOUS

## 2016-08-03 MED ORDER — DARBEPOETIN ALFA 100 MCG/0.5ML IJ SOSY
PREFILLED_SYRINGE | INTRAMUSCULAR | Status: AC
Start: 2016-08-03 — End: 2016-08-03
  Filled 2016-08-03: qty 0.5

## 2016-08-03 NOTE — Patient Instructions (Signed)
[place] at [facility] Discharge Instructions  RECOMMENDATIONS MADE BY THE CONSULTANT AND ANY TEST RESULTS WILL BE SENT TO YOUR REFERRING PHYSICIAN.  Hemoglobin 9.1 today. Aranesp 100 mcg injection given as ordered. Return as scheduled.  Thank you for choosing [place] at [facility] to provide your oncology and hematology care.  To afford each patient quality time with our provider, please arrive at least 15 minutes before your scheduled appointment time.    If you have a lab appointment with the [facility] please come in thru the  Main Entrance and check in at the main information desk  You need to re-schedule your appointment should you arrive 10 or more minutes late.  We strive to give you quality time with our providers, and arriving late affects you and other patients whose appointments are after yours.  Also, if you no show three or more times for appointments you may be dismissed from the clinic at the providers discretion.     Again, thank you for choosing [facility].  Our hope is that these requests will decrease the amount of time that you wait before being seen by our physicians.       _____________________________________________________________  Should you have questions after your visit to [facility], please contact our office at [phone] between the hours of 8:30 a.m. and 4:30 p.m.  Voicemails left after 4:30 p.m. will not be returned until the following business day.  For prescription refill requests, have your pharmacy contact our office.       Resources For Cancer Patients and their Caregivers ? American Cancer Society: Can assist with transportation, wigs, general needs, runs Look Good Feel Better.        313-338-3166 ? Cancer Care: Provides financial assistance, online support groups, medication/co-pay assistance.  1-800-813-HOPE 615-820-3864) ? [place] Assists [place] cancer patients and their families through emotional , educational and financial support.  7876782397 ? [place] DSS Where to apply for food stamps, Medicaid and utility assistance. 860-685-3761 ? RCATS: Transportation to medical appointments. 469-607-1839 ? Social Security Administration: May apply for disability if have a Stage IV cancer. [phone] 364-116-0778 ? [employer], Disability and Transit Services: Assists with nutrition, care and transit needs. [place] Support Programs: @10RELATIVEDAYS @ > Cancer Support Group  2nd Tuesday of the month 1pm-2pm, Journey Room  > Creative Journey  3rd Tuesday of the month 1130am-1pm, Journey Room  > Look Good Feel Better  1st Wednesday of the month 10am-12 noon, Journey Room (Call [place] to register [phone])

## 2016-08-03 NOTE — Progress Notes (Signed)
Justin [name] presents today for injection per MD orders. Aranesp 100 mcg administered SQ in right Upper Arm. Administration without incident. Patient tolerated well.

## 2016-08-17 ENCOUNTER — Encounter (HOSPITAL_COMMUNITY): Payer: Medicare Other | Attending: Oncology

## 2016-08-17 ENCOUNTER — Encounter (HOSPITAL_COMMUNITY): Payer: Medicare Other

## 2016-08-17 ENCOUNTER — Encounter (HOSPITAL_COMMUNITY): Payer: Self-pay

## 2016-08-17 VITALS — BP 136/59 | HR 73 | Temp 98.1°F | Resp 18

## 2016-08-17 DIAGNOSIS — N184 Chronic kidney disease, stage 4 (severe): Secondary | ICD-10-CM | POA: Diagnosis present

## 2016-08-17 DIAGNOSIS — D631 Anemia in chronic kidney disease: Secondary | ICD-10-CM

## 2016-08-17 DIAGNOSIS — N189 Chronic kidney disease, unspecified: Secondary | ICD-10-CM

## 2016-08-17 LAB — CBC
HCT: 29.6 % — ABNORMAL LOW (ref 39.0–52.0)
Hemoglobin: 8.6 g/dL — ABNORMAL LOW (ref 13.0–17.0)
MCH: 29.5 pg (ref 26.0–34.0)
MCHC: 29.1 g/dL — ABNORMAL LOW (ref 30.0–36.0)
MCV: 101.4 fL — ABNORMAL HIGH (ref 78.0–100.0)
Platelets: 178 10*3/uL (ref 150–400)
RBC: 2.92 MIL/uL — ABNORMAL LOW (ref 4.22–5.81)
RDW: 15.8 % — ABNORMAL HIGH (ref 11.5–15.5)
WBC: 5.7 10*3/uL (ref 4.0–10.5)

## 2016-08-17 MED ORDER — DARBEPOETIN ALFA 100 MCG/0.5ML IJ SOSY
PREFILLED_SYRINGE | INTRAMUSCULAR | Status: AC
  Filled 2016-08-17: qty 0.5

## 2016-08-17 MED ORDER — DARBEPOETIN ALFA 100 MCG/0.5ML IJ SOSY
100.0000 ug | PREFILLED_SYRINGE | Freq: Once | INTRAMUSCULAR | Status: AC
  Administered 2016-08-17: 100 ug via SUBCUTANEOUS

## 2016-08-17 NOTE — Patient Instructions (Signed)
[facility] at [facility] Discharge Instructions  RECOMMENDATIONS MADE BY THE CONSULTANT AND ANY TEST RESULTS WILL BE SENT TO YOUR REFERRING PHYSICIAN.  Aranesp today.    Thank you for choosing [facility] at [facility] to provide your oncology and hematology care.  To afford each patient quality time with our provider, please arrive at least 15 minutes before your scheduled appointment time.    If you have a lab appointment with the Cancer Center please come in thru the  Main Entrance and check in at the main information desk  You need to re-schedule your appointment should you arrive 10 or more minutes late.  We strive to give you quality time with our providers, and arriving late affects you and other patients whose appointments are after yours.  Also, if you no show three or more times for appointments you may be dismissed from the clinic at the providers discretion.     Again, thank you for choosing [facility].  Our hope is that these requests will decrease the amount of time that you wait before being seen by our physicians.       _____________________________________________________________  Should you have questions after your visit to  Cancer Center, please contact our office at [phone] between the hours of 8:30 a.m. and 4:30 p.m.  Voicemails left after 4:30 p.m. will not be returned until the following business day.  For prescription refill requests, have your pharmacy contact our office.       Resources For Cancer Patients and their Caregivers ? American Cancer Society: Can assist with transportation, wigs, general needs, runs Look Good Feel Better.        1-888-227-6333 ? Cancer Care: Provides financial assistance, online support groups, medication/co-pay assistance.  1-800-813-HOPE (4673) ? [facility] Assists [place] cancer patients and their families through emotional  , educational and financial support.  336-427-4357 ? [place] DSS Where to apply for food stamps, Medicaid and utility assistance. 336-342-1394 ? RCATS: Transportation to medical appointments. 336-347-2287 ? Social Security Administration: May apply for disability if have a Stage IV cancer. 336-342-7796 1-800-772-1213 ? [place] A[employer], Disability and Transit Services: Assists with nutrition, care and transit needs. 336-349-2343  Cancer Center Support Programs: @10RELATIVEDAYS@ > Cancer Support Group  2nd Tuesday of the month 1pm-2pm, Journey Room  > Creative Journey  3rd Tuesday of the month 1130am-1pm, Journey Room  > Look Good Feel Better  1st Wednesday of the month 10am-12 noon, Journey Room (Call [employer] to register [phone])    

## 2016-08-17 NOTE — Progress Notes (Signed)
Justin [name] presents today for injection per MD orders. Aranesp 13mcg administered SQ in right Upper Arm per patient preference. Administration without incident. Patient tolerated well.

## 2016-08-19 ENCOUNTER — Other Ambulatory Visit (HOSPITAL_COMMUNITY): Payer: Self-pay

## 2016-08-19 DIAGNOSIS — D631 Anemia in chronic kidney disease: Secondary | ICD-10-CM

## 2016-08-19 DIAGNOSIS — N184 Chronic kidney disease, stage 4 (severe): Principal | ICD-10-CM

## 2016-08-31 ENCOUNTER — Ambulatory Visit (HOSPITAL_COMMUNITY): Payer: Medicare Other

## 2016-08-31 ENCOUNTER — Other Ambulatory Visit (HOSPITAL_COMMUNITY): Payer: Medicare Other

## 2016-08-31 ENCOUNTER — Encounter (INDEPENDENT_AMBULATORY_CARE_PROVIDER_SITE_OTHER): Payer: Medicare Other

## 2016-08-31 ENCOUNTER — Ambulatory Visit (INDEPENDENT_AMBULATORY_CARE_PROVIDER_SITE_OTHER): Payer: Self-pay | Admitting: Vascular Surgery

## 2016-09-14 ENCOUNTER — Ambulatory Visit (HOSPITAL_COMMUNITY): Payer: Medicare Other

## 2016-09-14 ENCOUNTER — Other Ambulatory Visit (HOSPITAL_COMMUNITY): Payer: Medicare Other

## 2016-10-01 ENCOUNTER — Ambulatory Visit (HOSPITAL_COMMUNITY): Payer: Medicare Other | Admitting: Oncology

## 2016-10-15 ENCOUNTER — Other Ambulatory Visit (INDEPENDENT_AMBULATORY_CARE_PROVIDER_SITE_OTHER): Payer: Medicare Other

## 2016-10-15 ENCOUNTER — Other Ambulatory Visit (INDEPENDENT_AMBULATORY_CARE_PROVIDER_SITE_OTHER): Payer: Self-pay | Admitting: Vascular Surgery

## 2016-10-15 ENCOUNTER — Ambulatory Visit (INDEPENDENT_AMBULATORY_CARE_PROVIDER_SITE_OTHER): Payer: Medicare Other | Admitting: Vascular Surgery

## 2016-10-15 ENCOUNTER — Encounter (INDEPENDENT_AMBULATORY_CARE_PROVIDER_SITE_OTHER): Payer: Self-pay | Admitting: Vascular Surgery

## 2016-10-15 VITALS — BP 146/69 | HR 76 | Resp 17 | Ht 70.5 in | Wt 271.0 lb

## 2016-10-15 DIAGNOSIS — T829XXD Unspecified complication of cardiac and vascular prosthetic device, implant and graft, subsequent encounter: Secondary | ICD-10-CM | POA: Diagnosis not present

## 2016-10-15 DIAGNOSIS — E1159 Type 2 diabetes mellitus with other circulatory complications: Secondary | ICD-10-CM | POA: Diagnosis not present

## 2016-10-15 DIAGNOSIS — N189 Chronic kidney disease, unspecified: Secondary | ICD-10-CM | POA: Diagnosis not present

## 2016-10-15 DIAGNOSIS — I159 Secondary hypertension, unspecified: Secondary | ICD-10-CM

## 2016-10-15 DIAGNOSIS — N186 End stage renal disease: Secondary | ICD-10-CM | POA: Diagnosis not present

## 2016-10-15 NOTE — Progress Notes (Signed)
Subjective:    Patient ID: Justin [name], male    DOB: 12/12/1945, 71 y.o.   MRN: [id number] Chief Complaint  Patient presents with  . Re-evaluation    6 month HDA   Patient presents for a six month hemodialysis access follow up. The patient has a left upper extremity fistulogram. He is currently not on dialysis. The patient underwent a duplex ultrasound of the AV access which was notable for a patent fistula without any significant hemodynamic stenosis. The patient also denies fistula skin breakdown, pain, edema, pallor or ulceration of the arm / hand.      Review of Systems  Constitutional: Negative.   HENT: Negative.   Eyes: Negative.   Respiratory: Negative.   Cardiovascular: Negative.   Gastrointestinal: Negative.   Endocrine: Negative.   Genitourinary: Negative.   Musculoskeletal: Negative.   Skin: Negative.   Allergic/Immunologic: Negative.   Neurological: Negative.   Hematological: Negative.   Psychiatric/Behavioral: Negative.       Objective:   Physical Exam  Constitutional: He is oriented to person, place, and time. He appears well-developed and well-nourished. No distress.  HENT:  Head: Normocephalic and atraumatic.  Eyes: Conjunctivae are normal. Pupils are equal, round, and reactive to light.  Neck: Normal range of motion.  Cardiovascular: Normal rate, regular rhythm, normal heart sounds and intact distal pulses.   Pulses:      Radial pulses are 2+ on the right side, and 2+ on the left side.  Left Upper Extremity Fistula: Good Bruit and Thrill   Pulmonary/Chest: Effort normal.  Musculoskeletal: Normal range of motion.  Neurological: He is alert and oriented to person, place, and time.  Skin: Skin is warm and dry. He is not diaphoretic.  Psychiatric: He has a normal mood and affect. His behavior is normal. Judgment and thought content normal.  Vitals reviewed.  BP (!) 146/69 (BP Location: Right Arm)   Pulse 76   Resp 17   Ht 5' 10.5" (1.791 m)   Wt 271  lb (122.9 kg)   BMI 38.33 kg/m   Past Medical History:  Diagnosis Date  . Anemia   . Anemia in chronic kidney disease 11/20/2015  . Cancer [facility]) 2009   Prostate; radiation seeds  . Chronic kidney disease    stage 4  . Diabetes mellitus   . Gout   . Hypertension    Social History   Social History  . Marital status: Widowed    Spouse name: N/A  . Number of children: N/A  . Years of education: N/A   Occupational History  . Not on file.   Social History Main Topics  . Smoking status: Former Smoker    Quit date: 10/01/1978  . Smokeless tobacco: Never Used  . Alcohol use No  . Drug use: No  . Sexual activity: Not on file   Other Topics Concern  . Not on file   Social History Narrative  . No narrative on file   Past Surgical History:  Procedure Laterality Date  . AV FISTULA PLACEMENT Left 05/31/2015   Procedure: ARTERIOVENOUS (AV) FISTULA CREATION;  Surgeon: Katha Cabal, MD;  Location: [facility] ORS;  Service: Vascular;  Laterality: Left;  . excision bone spurs Bilateral 1989   feet  . KNEE SURGERY Left 1998   arthroscopy  . SHOULDER SURGERY Left 1994   rotator cuff   Family History  Problem Relation Age of Onset  . Kidney disease Mother   . Alcoholism Father   .  Alcoholism Brother    No Known Allergies     Assessment & Plan:  Patient presents for a six month hemodialysis access follow up. The patient has a left upper extremity fistulogram. He is currently not on dialysis. The patient underwent a duplex ultrasound of the AV access which was notable for a patent fistula without any significant hemodynamic stenosis. The patient also denies fistula skin breakdown, pain, edema, pallor or ulceration of the arm / hand.     1. Chronic kidney disease, unspecified CKD stage - Stable No need for dialysis at this time. Studies reviewed with patient. The patient is doing well and currently has adequate dialysis access. Duplex ultrasound of the AV access shows a patent  access with no evidence of hemodynamically significant strictures or stenosis.  The patient should continue to have duplex ultrasounds of the dialysis access every six months to assure patency in case dialysis is needed. The patient was instructed to call the office in the interim if any issues with dialysis access / doppler flow, pain, edema, pallor, fistula skin breakdown or ulceration of the arm / hand occur. The patient expressed their understanding.  - VAS US DUPLEX DIALYSIS ACCESS (AVF,AVG); Future  2. Type 2 diabetes mellitus with other circulatory complication, unspecified long term insulin use status (HCC) - Stable Encouraged good control as its slows the progression of atherosclerotic disease.  3. Secondary hypertension - Stable Encouraged good control as its slows the progression of atherosclerotic disease.  Current Outpatient Prescriptions on File Prior to Visit  Medication Sig Dispense Refill  . allopurinol (ZYLOPRIM) 100 MG tablet Take 100 mg by mouth every morning.     Marland Kitchen amLODipine (NORVASC) 10 MG tablet Take 10 mg by mouth every morning.     Marland Kitchen aspirin 81 MG tablet Take 81 mg by mouth every morning.    Marland Kitchen atorvastatin (LIPITOR) 40 MG tablet Take 40 mg by mouth every morning. Reported on 10/01/2015    . carvedilol (COREG) 25 MG tablet Take 25 mg by mouth 2 (two) times daily with a meal.      . cyclobenzaprine (FLEXERIL) 5 MG tablet Take 1 tablet (5 mg total) by mouth 3 (three) times daily as needed. 30 tablet 0  . doxazosin (CARDURA) 8 MG tablet Take 8 mg by mouth at bedtime.      Marland Kitchen glipiZIDE (GLUCOTROL) 5 MG tablet Take 5 mg by mouth 2 (two) times daily before a meal. Reported on 10/01/2015    . hydrALAZINE (APRESOLINE) 50 MG tablet Take 50 mg by mouth 2 (two) times daily.    Marland Kitchen HYDROcodone-acetaminophen (NORCO/VICODIN) 5-325 MG per tablet Take 1 tablet by mouth every 6 (six) hours as needed for pain. 30 tablet 0  . indomethacin (INDOCIN) 50 MG capsule TAKE ONE CAPSULE BY MOUTH  TWICE A DAY AS NEEDED FOR PAIN X5 DAYS  0  . insulin glargine (LANTUS) 100 UNIT/ML injection Inject 10 Units into the skin at bedtime.     Marland Kitchen losartan (COZAAR) 100 MG tablet Take 100 mg by mouth daily.      Marland Kitchen omeprazole (PRILOSEC) 20 MG capsule Take 20 mg by mouth every morning.     Glory Rosebush VERIO test strip USE 1 STRIP VIA METER ONCE A DAY TO CHECK BLOOD SUGAR  1  . potassium chloride (KLOR-CON) 10 MEQ CR tablet Take 10 mEq by mouth every morning. Reported on 10/01/2015    . torsemide (DEMADEX) 20 MG tablet Take 20 mg by mouth 2 (two) times daily.    Marland Kitchen  traMADol (ULTRAM) 50 MG tablet Take 2 tablets (100 mg total) by mouth every 6 (six) hours as needed. 16 tablet 0   No current facility-administered medications on file prior to visit.     There are no Patient Instructions on file for this visit. No Follow-up on file.   Travonne Schowalter A Demeisha Geraghty, PA-C

## 2016-12-30 ENCOUNTER — Ambulatory Visit (HOSPITAL_COMMUNITY)
Admission: RE | Admit: 2016-12-30 | Discharge: 2016-12-30 | Disposition: A | Discharge status: Home / Self Care | Payer: Medicare Other | Source: Ambulatory Visit | Attending: Nephrology | Admitting: Nephrology

## 2016-12-30 ENCOUNTER — Other Ambulatory Visit (HOSPITAL_COMMUNITY): Payer: Self-pay | Admitting: Nephrology

## 2016-12-30 DIAGNOSIS — R938 Abnormal findings on diagnostic imaging of other specified body structures: Secondary | ICD-10-CM | POA: Diagnosis present

## 2016-12-30 DIAGNOSIS — N185 Chronic kidney disease, stage 5: Secondary | ICD-10-CM

## 2016-12-30 DIAGNOSIS — R918 Other nonspecific abnormal finding of lung field: Secondary | ICD-10-CM | POA: Insufficient documentation

## 2017-01-08 DIAGNOSIS — M1000 Idiopathic gout, unspecified site: Secondary | ICD-10-CM | POA: Insufficient documentation

## 2017-01-08 DIAGNOSIS — D689 Coagulation defect, unspecified: Secondary | ICD-10-CM | POA: Insufficient documentation

## 2017-01-08 DIAGNOSIS — D509 Iron deficiency anemia, unspecified: Secondary | ICD-10-CM | POA: Insufficient documentation

## 2017-01-11 DIAGNOSIS — Z992 Dependence on renal dialysis: Secondary | ICD-10-CM | POA: Insufficient documentation

## 2017-01-11 DIAGNOSIS — I131 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, with stage 1 through stage 4 chronic kidney disease, or unspecified chronic kidney disease: Secondary | ICD-10-CM | POA: Insufficient documentation

## 2017-01-21 DIAGNOSIS — N2581 Secondary hyperparathyroidism of renal origin: Secondary | ICD-10-CM | POA: Insufficient documentation

## 2017-05-17 ENCOUNTER — Ambulatory Visit (INDEPENDENT_AMBULATORY_CARE_PROVIDER_SITE_OTHER): Payer: Medicare Other | Admitting: Vascular Surgery

## 2017-05-17 ENCOUNTER — Encounter (INDEPENDENT_AMBULATORY_CARE_PROVIDER_SITE_OTHER): Payer: Medicare Other

## 2017-06-22 ENCOUNTER — Encounter (INDEPENDENT_AMBULATORY_CARE_PROVIDER_SITE_OTHER): Payer: Medicare Other

## 2017-06-22 ENCOUNTER — Ambulatory Visit (INDEPENDENT_AMBULATORY_CARE_PROVIDER_SITE_OTHER): Payer: Medicare Other | Admitting: Vascular Surgery

## 2017-07-12 DIAGNOSIS — C61 Malignant neoplasm of prostate: Secondary | ICD-10-CM | POA: Insufficient documentation

## 2017-07-13 DIAGNOSIS — Z7901 Long term (current) use of anticoagulants: Secondary | ICD-10-CM | POA: Insufficient documentation

## 2017-07-29 ENCOUNTER — Other Ambulatory Visit: Payer: Self-pay

## 2017-07-29 ENCOUNTER — Encounter (INDEPENDENT_AMBULATORY_CARE_PROVIDER_SITE_OTHER): Payer: Self-pay | Admitting: Vascular Surgery

## 2017-07-29 ENCOUNTER — Ambulatory Visit (INDEPENDENT_AMBULATORY_CARE_PROVIDER_SITE_OTHER): Payer: Medicare Other

## 2017-07-29 ENCOUNTER — Ambulatory Visit (INDEPENDENT_AMBULATORY_CARE_PROVIDER_SITE_OTHER): Payer: Medicare Other | Admitting: Vascular Surgery

## 2017-07-29 VITALS — BP 120/76 | HR 88 | Ht 70.0 in | Wt 234.0 lb

## 2017-07-29 DIAGNOSIS — E1159 Type 2 diabetes mellitus with other circulatory complications: Secondary | ICD-10-CM

## 2017-07-29 DIAGNOSIS — Z992 Dependence on renal dialysis: Secondary | ICD-10-CM | POA: Diagnosis not present

## 2017-07-29 DIAGNOSIS — I159 Secondary hypertension, unspecified: Secondary | ICD-10-CM

## 2017-07-29 DIAGNOSIS — N186 End stage renal disease: Secondary | ICD-10-CM

## 2017-07-29 DIAGNOSIS — N189 Chronic kidney disease, unspecified: Secondary | ICD-10-CM | POA: Diagnosis not present

## 2017-07-29 NOTE — Progress Notes (Signed)
Subjective:    Patient ID: Justin [name], male    DOB: 29-Nov-1945, 72 y.o.   MRN: [id number] Chief Complaint  Patient presents with  . Follow-up    Hemodialysis access   Patient presents for six-month HDA follow-up.  The patient reports some issues with cannulation.  The patient underwent a left upper extremity HDA which was notable for hemodynamically significant velocity increase in the left outflow vein near the subclavian/cephalic vein confluence which appears to be due to a change in vessel diameter.  Significant focal velocity increase in the mid upper arm outflow vein which appears to be due to wall thickening versus thrombus.  Antegrade flow in the distal radial artery with velocities that are not consistent with a significant fistula steal.  Flow volume: 1088. The patient also denies any fistula skin breakdown, pain, edema, pallor or ulceration of the arm / hand.   Review of Systems  Constitutional: Negative.   HENT: Negative.   Eyes: Negative.   Respiratory: Negative.   Cardiovascular: Negative.   Gastrointestinal: Negative.   Endocrine: Negative.   Genitourinary: Negative.   Musculoskeletal: Negative.   Skin: Negative.   Allergic/Immunologic: Negative.   Neurological: Negative.   Hematological: Negative.   Psychiatric/Behavioral: Negative.       Objective:   Physical Exam  Constitutional: He is oriented to person, place, and time. He appears well-developed and well-nourished. No distress.  HENT:  Head: Normocephalic and atraumatic.  Eyes: Conjunctivae are normal. Pupils are equal, round, and reactive to light.  Neck: Normal range of motion.  Cardiovascular: Normal rate, regular rhythm, normal heart sounds and intact distal pulses.  Pulses:      Radial pulses are 2+ on the right side, and 2+ on the left side.  Left upper extremity fistula: Palpable thrill and audible bruit.  Skin is intact.  Pulmonary/Chest: Effort normal and breath sounds normal.    Musculoskeletal: Normal range of motion. He exhibits no edema.  Neurological: He is alert and oriented to person, place, and time.  Skin: Skin is warm and dry. He is not diaphoretic.  Psychiatric: He has a normal mood and affect. His behavior is normal. Judgment and thought content normal.  Vitals reviewed.  BP 120/76 (BP Location: Right Arm)   Pulse 88   Ht 5\' 10"  (1.778 m)   Wt 234 lb (106.1 kg)   BMI 33.58 kg/m   Past Medical History:  Diagnosis Date  . Anemia   . Anemia in chronic kidney disease 11/20/2015  . Cancer [facility]) 2009   Prostate; radiation seeds  . Chronic kidney disease    stage 4  . Diabetes mellitus   . Gout   . Hypertension    Social History   Socioeconomic History  . Marital status: Widowed    Spouse name: Not on file  . Number of children: Not on file  . Years of education: Not on file  . Highest education level: Not on file  Social Needs  . Financial resource strain: Not on file  . Food insecurity - worry: Not on file  . Food insecurity - inability: Not on file  . Transportation needs - medical: Not on file  . Transportation needs - non-medical: Not on file  Occupational History  . Not on file  Tobacco Use  . Smoking status: Former Smoker    Last attempt to quit: 10/01/1978    Years since quitting: 38.8  . Smokeless tobacco: Never Used  Substance and Sexual Activity  . Alcohol  use: No  . Drug use: No  . Sexual activity: Not on file  Other Topics Concern  . Not on file  Social History Narrative  . Not on file   Past Surgical History:  Procedure Laterality Date  . AV FISTULA PLACEMENT Left 05/31/2015   Procedure: ARTERIOVENOUS (AV) FISTULA CREATION;  Surgeon: Katha Cabal, MD;  Location: [facility] ORS;  Service: Vascular;  Laterality: Left;  . excision bone spurs Bilateral 1989   feet  . KNEE SURGERY Left 1998   arthroscopy  . SHOULDER SURGERY Left 1994   rotator cuff   Family History  Problem Relation Age of Onset  . Kidney  disease Mother   . Alcoholism Father   . Alcoholism Brother    No Known Allergies     Assessment & Plan:  Patient presents for six-month HDA follow-up.  The patient reports some issues with cannulation.  The patient underwent a left upper extremity HDA which was notable for hemodynamically significant velocity increase in the left outflow vein near the subclavian/cephalic vein confluence which appears to be due to a change in vessel diameter.  Significant focal velocity increase in the mid upper arm outflow vein which appears to be due to wall thickening versus thrombus.  Antegrade flow in the distal radial artery with velocities that are not consistent with a significant fistula steal.  Flow volume: 1088. The patient also denies any fistula skin breakdown, pain, edema, pallor or ulceration of the arm / hand.  1. ESRD on dialysis ([place]) - Stable Patient with 2 areas of stenosis where the flow is less than 100 Total flow volume less than 1500 Patient having issues with cannulation Recommend a left upper extremity fistulogram with possible intervention to assess the patient's anatomy and if appropriate at that time to correct any areas of stenosis. Procedure, risks and benefits explained to the patient All questions answered The patient dialyzes Monday Wednesday Friday Patient wishes to proceed  2. Type 2 diabetes mellitus with other circulatory complication, unspecified whether long term insulin use (HCC) - Stable Encouraged good control as its slows the progression of atherosclerotic disease  3. Secondary hypertension - Stable Encouraged good control as its slows the progression of atherosclerotic disease  Current Outpatient Medications on File Prior to Visit  Medication Sig Dispense Refill  . allopurinol (ZYLOPRIM) 100 MG tablet Take 100 mg by mouth every morning.     Marland Kitchen amLODipine (NORVASC) 10 MG tablet Take 10 mg by mouth every morning.     Marland Kitchen aspirin 81 MG tablet Take 81 mg by mouth  every morning.    Marland Kitchen atorvastatin (LIPITOR) 40 MG tablet Take 40 mg by mouth every morning. Reported on 10/01/2015    . carvedilol (COREG) 25 MG tablet Take 25 mg by mouth 2 (two) times daily with a meal.      . cyclobenzaprine (FLEXERIL) 5 MG tablet Take 1 tablet (5 mg total) by mouth 3 (three) times daily as needed. 30 tablet 0  . doxazosin (CARDURA) 8 MG tablet Take 8 mg by mouth at bedtime.      Marland Kitchen glipiZIDE (GLUCOTROL) 5 MG tablet Take 5 mg by mouth 2 (two) times daily before a meal. Reported on 10/01/2015    . hydrALAZINE (APRESOLINE) 50 MG tablet Take 50 mg by mouth 2 (two) times daily.    Marland Kitchen HYDROcodone-acetaminophen (NORCO/VICODIN) 5-325 MG per tablet Take 1 tablet by mouth every 6 (six) hours as needed for pain. 30 tablet 0  . indomethacin (INDOCIN) 50  MG capsule TAKE ONE CAPSULE BY MOUTH TWICE A DAY AS NEEDED FOR PAIN X5 DAYS  0  . insulin glargine (LANTUS) 100 UNIT/ML injection Inject 10 Units into the skin at bedtime.     Marland Kitchen losartan (COZAAR) 100 MG tablet Take 100 mg by mouth daily.      Marland Kitchen omeprazole (PRILOSEC) 20 MG capsule Take 20 mg by mouth every morning.     Glory Rosebush VERIO test strip USE 1 STRIP VIA METER ONCE A DAY TO CHECK BLOOD SUGAR  1  . potassium chloride (KLOR-CON) 10 MEQ CR tablet Take 10 mEq by mouth every morning. Reported on 10/01/2015    . torsemide (DEMADEX) 20 MG tablet Take 20 mg by mouth 2 (two) times daily.    . traMADol (ULTRAM) 50 MG tablet Take 2 tablets (100 mg total) by mouth every 6 (six) hours as needed. 16 tablet 0   No current facility-administered medications on file prior to visit.    There are no Patient Instructions on file for this visit. No Follow-up on file.  Abad Manard A Lilton Pare, PA-C

## 2017-07-30 DIAGNOSIS — Z23 Encounter for immunization: Secondary | ICD-10-CM | POA: Insufficient documentation

## 2017-08-03 ENCOUNTER — Encounter (INDEPENDENT_AMBULATORY_CARE_PROVIDER_SITE_OTHER): Payer: Self-pay

## 2017-08-11 ENCOUNTER — Other Ambulatory Visit (INDEPENDENT_AMBULATORY_CARE_PROVIDER_SITE_OTHER): Payer: Self-pay | Admitting: Vascular Surgery

## 2017-08-16 MED ORDER — CEFAZOLIN SODIUM-DEXTROSE 1-4 GM/50ML-% IV SOLN
1.0000 g | Freq: Once | INTRAVENOUS | Status: AC
  Administered 2017-08-17: 1 g via INTRAVENOUS

## 2017-08-17 ENCOUNTER — Ambulatory Visit
Admission: RE | Admit: 2017-08-17 | Discharge: 2017-08-17 | Disposition: A | Discharge status: Home / Self Care | Payer: Medicare Other | Source: Ambulatory Visit | Attending: Vascular Surgery | Admitting: Vascular Surgery

## 2017-08-17 ENCOUNTER — Encounter: Payer: Self-pay | Admitting: *Deleted

## 2017-08-17 ENCOUNTER — Encounter
Admission: RE | Disposition: A | Discharge status: Home / Self Care | Payer: Self-pay | Source: Ambulatory Visit | Attending: Vascular Surgery

## 2017-08-17 DIAGNOSIS — Z79899 Other long term (current) drug therapy: Secondary | ICD-10-CM | POA: Insufficient documentation

## 2017-08-17 DIAGNOSIS — Z7982 Long term (current) use of aspirin: Secondary | ICD-10-CM | POA: Insufficient documentation

## 2017-08-17 DIAGNOSIS — Z794 Long term (current) use of insulin: Secondary | ICD-10-CM | POA: Diagnosis not present

## 2017-08-17 DIAGNOSIS — Z992 Dependence on renal dialysis: Secondary | ICD-10-CM | POA: Insufficient documentation

## 2017-08-17 DIAGNOSIS — Y832 Surgical operation with anastomosis, bypass or graft as the cause of abnormal reaction of the patient, or of later complication, without mention of misadventure at the time of the procedure: Secondary | ICD-10-CM | POA: Insufficient documentation

## 2017-08-17 DIAGNOSIS — M109 Gout, unspecified: Secondary | ICD-10-CM | POA: Insufficient documentation

## 2017-08-17 DIAGNOSIS — Z9889 Other specified postprocedural states: Secondary | ICD-10-CM | POA: Diagnosis not present

## 2017-08-17 DIAGNOSIS — N184 Chronic kidney disease, stage 4 (severe): Secondary | ICD-10-CM | POA: Insufficient documentation

## 2017-08-17 DIAGNOSIS — E1122 Type 2 diabetes mellitus with diabetic chronic kidney disease: Secondary | ICD-10-CM | POA: Diagnosis not present

## 2017-08-17 DIAGNOSIS — Z87891 Personal history of nicotine dependence: Secondary | ICD-10-CM | POA: Insufficient documentation

## 2017-08-17 DIAGNOSIS — Z8546 Personal history of malignant neoplasm of prostate: Secondary | ICD-10-CM | POA: Insufficient documentation

## 2017-08-17 DIAGNOSIS — Z841 Family history of disorders of kidney and ureter: Secondary | ICD-10-CM | POA: Insufficient documentation

## 2017-08-17 DIAGNOSIS — E1159 Type 2 diabetes mellitus with other circulatory complications: Secondary | ICD-10-CM | POA: Insufficient documentation

## 2017-08-17 DIAGNOSIS — T82858A Stenosis of vascular prosthetic devices, implants and grafts, initial encounter: Secondary | ICD-10-CM | POA: Diagnosis present

## 2017-08-17 DIAGNOSIS — D631 Anemia in chronic kidney disease: Secondary | ICD-10-CM | POA: Insufficient documentation

## 2017-08-17 DIAGNOSIS — N186 End stage renal disease: Secondary | ICD-10-CM | POA: Diagnosis not present

## 2017-08-17 DIAGNOSIS — Z811 Family history of alcohol abuse and dependence: Secondary | ICD-10-CM | POA: Insufficient documentation

## 2017-08-17 DIAGNOSIS — I129 Hypertensive chronic kidney disease with stage 1 through stage 4 chronic kidney disease, or unspecified chronic kidney disease: Secondary | ICD-10-CM | POA: Insufficient documentation

## 2017-08-17 DIAGNOSIS — T82868A Thrombosis of vascular prosthetic devices, implants and grafts, initial encounter: Secondary | ICD-10-CM | POA: Diagnosis not present

## 2017-08-17 HISTORY — PX: A/V SHUNT INTERVENTION: CATH118220

## 2017-08-17 HISTORY — PX: A/V FISTULAGRAM: CATH118298

## 2017-08-17 LAB — POTASSIUM (ARMC VASCULAR LAB ONLY): Potassium (ARMC vascular lab): 3.8 (ref 3.5–5.1)

## 2017-08-17 SURGERY — A/V FISTULAGRAM
Anesthesia: Moderate Sedation

## 2017-08-17 MED ORDER — IOPAMIDOL (ISOVUE-300) INJECTION 61%
INTRAVENOUS | Status: DC | PRN
  Administered 2017-08-17: 25 mL via INTRA_ARTERIAL

## 2017-08-17 MED ORDER — FENTANYL CITRATE (PF) 100 MCG/2ML IJ SOLN
INTRAMUSCULAR | Status: AC
  Filled 2017-08-17: qty 2

## 2017-08-17 MED ORDER — HEPARIN (PORCINE) IN NACL 2-0.9 UNIT/ML-% IJ SOLN
INTRAMUSCULAR | Status: AC
  Filled 2017-08-17: qty 1000

## 2017-08-17 MED ORDER — HYDROMORPHONE HCL 1 MG/ML IJ SOLN: 1.0000 mg | Freq: Once | INTRAMUSCULAR | Status: DC | PRN

## 2017-08-17 MED ORDER — ONDANSETRON HCL 4 MG/2ML IJ SOLN: 4.0000 mg | Freq: Four times a day (QID) | INTRAMUSCULAR | Status: DC | PRN

## 2017-08-17 MED ORDER — MIDAZOLAM HCL 5 MG/5ML IJ SOLN
INTRAMUSCULAR | Status: AC
  Filled 2017-08-17: qty 5

## 2017-08-17 MED ORDER — SODIUM CHLORIDE 0.9 % IV SOLN
INTRAVENOUS | Status: DC
  Administered 2017-08-17: 07:00:00 via INTRAVENOUS

## 2017-08-17 MED ORDER — FENTANYL CITRATE (PF) 100 MCG/2ML IJ SOLN
INTRAMUSCULAR | Status: DC | PRN
  Administered 2017-08-17 (×2): 50 ug via INTRAVENOUS

## 2017-08-17 MED ORDER — HEPARIN SODIUM (PORCINE) 1000 UNIT/ML IJ SOLN
INTRAMUSCULAR | Status: DC | PRN
  Administered 2017-08-17: 3000 [IU] via INTRAVENOUS

## 2017-08-17 MED ORDER — FAMOTIDINE 20 MG PO TABS: 40.0000 mg | ORAL_TABLET | ORAL | Status: DC | PRN

## 2017-08-17 MED ORDER — MIDAZOLAM HCL 2 MG/2ML IJ SOLN
INTRAMUSCULAR | Status: DC | PRN
  Administered 2017-08-17 (×2): 1 mg via INTRAVENOUS

## 2017-08-17 MED ORDER — LIDOCAINE HCL (PF) 1 % IJ SOLN
INTRAMUSCULAR | Status: AC
  Filled 2017-08-17: qty 30

## 2017-08-17 MED ORDER — HEPARIN SODIUM (PORCINE) 1000 UNIT/ML IJ SOLN
INTRAMUSCULAR | Status: AC
  Filled 2017-08-17: qty 1

## 2017-08-17 MED ORDER — METHYLPREDNISOLONE SODIUM SUCC 125 MG IJ SOLR: 125.0000 mg | INTRAMUSCULAR | Status: DC | PRN

## 2017-08-17 SURGICAL SUPPLY — 14 items
BALLN DORADO 8X60X80 (BALLOONS) ×3
BALLN DORADO 9X80X80 (BALLOONS) ×3
BALLN ULTRASCORE 7X40X130 (BALLOONS) ×3
BALLOON DORADO 8X60X80 (BALLOONS) IMPLANT
BALLOON DORADO 9X80X80 (BALLOONS) IMPLANT
BALLOON ULTRASCORE 7X40X130 (BALLOONS) IMPLANT
CANNULA 5F STIFF (CANNULA) ×3 IMPLANT
COVER PROBE U/S 5X48 (MISCELLANEOUS) ×3 IMPLANT
DEVICE PRESTO INFLATION (MISCELLANEOUS) ×1 IMPLANT
DRAPE BRACHIAL (DRAPES) ×1 IMPLANT
PACK ANGIOGRAPHY (CUSTOM PROCEDURE TRAY) ×3 IMPLANT
SHEATH BRITE TIP 6FRX5.5 (SHEATH) ×3 IMPLANT
SUT MNCRL AB 4-0 PS2 18 (SUTURE) ×3 IMPLANT
WIRE MAGIC TORQUE 260C (WIRE) ×1 IMPLANT

## 2017-08-17 NOTE — H&P (Signed)
 [facility] History & Physical Update  The patient was interviewed and re-examined.  The patient's previous History and Physical has been reviewed and is unchanged.  There is no change in the plan of care. We plan to proceed with the scheduled procedure.  Hortencia Pilar, MD  08/17/2017, 8:04 AM

## 2017-08-17 NOTE — Op Note (Signed)
OPERATIVE NOTE   PROCEDURE: 1. Contrast injection left arm brachiocephalic AV access 2. Percutaneous transluminal angioplasty peripheral segment to 9 mm 3. Percutaneous transluminal angioplasty central venous segment to 9 mm  PRE-OPERATIVE DIAGNOSIS: Complication of dialysis access                                                       End Stage Renal Disease  POST-OPERATIVE DIAGNOSIS: same as above   SURGEON: Katha Cabal, M.D.  ANESTHESIA: Conscious sedation was administered under my direct supervision by the interventional radiology RN. IV Versed plus fentanyl were utilized. Continuous ECG, pulse oximetry and blood pressure was monitored throughout the entire procedure.  Conscious sedation was for a total of 41 minutes.  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 male who  presents with malfunctioning left brachiocephalic AV access.  The patient is scheduled for angiography with possible intervention of the AV access.  The patient is aware the risks include but are not limited to: bleeding, infection, thrombosis of the cannulated access, and possible anaphylactic reaction to the contrast.  The patient acknowledges if the access can not be salvaged a tunneled catheter will be needed and will be placed during this procedure.  The patient is aware of the risks of the procedure and elects to proceed with the angiogram and intervention.  DESCRIPTION: After full informed written consent was obtained, the patient was brought back to the Special Procedure suite and placed supine position.  Appropriate cardiopulmonary monitors were placed.  The left arm was prepped and draped in the standard fashion.  Appropriate timeout is called. The left brachiocephalic fistula  was cannulated with a micropuncture needle.  Cannulation was performed with ultrasound guidance. Ultrasound was placed in a sterile sleeve, the AV access was interrogated and noted to be echolucent and compressible indicating patency. Image was recorded for the permanent record. The puncture is performed under continuous ultrasound visualization.   The microwire was advanced and the needle was exchanged for  a microsheath.  The J-wire was then advanced and a 6 Fr sheath inserted.  Hand injections were completed to image the access from the arterial anastomosis through the entire access.  The central venous structures were also imaged by hand injections.  Based on the images,  3000 units of heparin was given and a wire was negotiated through the strictures within the venous portion of the graft as well as the central stenosis. The sheath was then upsized to a 7 [place] sheath.  An 7 mm ultra score balloon followed by an 8 mm [place] balloon and ultimately a 9 mm [place]ado balloon was used.  Inflation was to 26 atm for 1 minute.  The detector was then repositioned over the peripheral portion of the AV access and 8 mm [place] balloon followed by the 9 mm [place] balloon balloon was used to treat the stricture within the AV access. Inflation was to 20 atm for 1 minutes.  Follow-up imaging demonstrates significant improvement with a marked reduction of the strictures.  There is now rapid flow of contrast through the graft and the central veins.  A 4-0 Monocryl purse-string suture was sewn around the sheath.  The sheath was removed and light pressure was applied.  A sterile bandage was applied to the puncture site.    COMPLICATIONS: None  CONDITION: Justin [name], M.D [facility] Office: [phone]  08/17/2017 8:57 AM

## 2017-08-18 ENCOUNTER — Encounter: Payer: Self-pay | Admitting: Vascular Surgery

## 2017-09-14 ENCOUNTER — Other Ambulatory Visit (INDEPENDENT_AMBULATORY_CARE_PROVIDER_SITE_OTHER): Payer: Self-pay | Admitting: Vascular Surgery

## 2017-09-14 DIAGNOSIS — N185 Chronic kidney disease, stage 5: Secondary | ICD-10-CM

## 2017-09-16 ENCOUNTER — Encounter (INDEPENDENT_AMBULATORY_CARE_PROVIDER_SITE_OTHER): Payer: Self-pay | Admitting: Vascular Surgery

## 2017-09-16 ENCOUNTER — Ambulatory Visit (INDEPENDENT_AMBULATORY_CARE_PROVIDER_SITE_OTHER): Payer: Medicare Other | Admitting: Vascular Surgery

## 2017-09-16 ENCOUNTER — Ambulatory Visit (INDEPENDENT_AMBULATORY_CARE_PROVIDER_SITE_OTHER): Payer: Medicare Other

## 2017-09-16 VITALS — BP 97/57 | HR 80 | Resp 16 | Wt 233.0 lb

## 2017-09-16 DIAGNOSIS — N186 End stage renal disease: Secondary | ICD-10-CM | POA: Diagnosis not present

## 2017-09-16 DIAGNOSIS — N185 Chronic kidney disease, stage 5: Secondary | ICD-10-CM | POA: Diagnosis not present

## 2017-09-16 DIAGNOSIS — N189 Chronic kidney disease, unspecified: Secondary | ICD-10-CM

## 2017-09-16 DIAGNOSIS — D631 Anemia in chronic kidney disease: Secondary | ICD-10-CM | POA: Diagnosis not present

## 2017-09-16 DIAGNOSIS — Z992 Dependence on renal dialysis: Secondary | ICD-10-CM | POA: Diagnosis not present

## 2017-09-16 NOTE — Progress Notes (Signed)
Subjective:    Patient ID: Justin [name], male    DOB: 21-Apr-1946, 72 y.o.   MRN: [id number] Chief Complaint  Patient presents with  . Follow-up    [facility] 2 wk follow up   The patient presents for his first post procedure follow-up.  The patient is status post a left upper extremity fistulogram on August 17, 2017.  The patient reports an improvement in the function of his fistula since his recent intervention. The patient underwent a duplex ultrasound of the AV access which was notable for a patent fistula without any significant hemodynamic stenosis. Patient reports his hemodialysis doppler flow is 980. The patient denies any issues with hemodialysis such as cannulation problems, increased bleeding, decrease in doppler flow or recirculation. The patient also denies any fistula skin breakdown, pain, edema, pallor or ulceration of the arm / hand.   Review of Systems  Constitutional: Negative.   HENT: Negative.   Eyes: Negative.   Respiratory: Negative.   Cardiovascular: Negative.   Gastrointestinal: Negative.   Endocrine: Negative.   Genitourinary: Negative.   Musculoskeletal: Negative.   Skin: Negative.   Allergic/Immunologic: Negative.   Neurological: Negative.   Hematological: Negative.   Psychiatric/Behavioral: Negative.       Objective:   Physical Exam  Constitutional: He is oriented to person, place, and time. He appears well-developed and well-nourished. No distress.  HENT:  Head: Normocephalic and atraumatic.  Eyes: Conjunctivae are normal. Pupils are equal, round, and reactive to light.  Neck: Normal range of motion.  Cardiovascular: Normal rate, regular rhythm, normal heart sounds and intact distal pulses.  Pulses:      Radial pulses are 2+ on the right side, and 2+ on the left side.  Upper extremity dialysis access: Bruit and thrill noted on exam.  Skin is intact.  Pulmonary/Chest: Effort normal and breath sounds normal.  Musculoskeletal: Normal range of motion. He  exhibits no edema.  Neurological: He is alert and oriented to person, place, and time.  Skin: Skin is warm and dry. He is not diaphoretic.  Psychiatric: He has a normal mood and affect. His behavior is normal. Judgment and thought content normal.  Vitals reviewed.  BP (!) 97/57 (BP Location: Right Arm)   Pulse 80   Resp 16   Wt 233 lb (105.7 kg)   BMI 33.43 kg/m   Past Medical History:  Diagnosis Date  . Anemia   . Anemia in chronic kidney disease 11/20/2015  . Cancer [facility]) 2009   Prostate; radiation seeds  . Chronic kidney disease    stage 4  . Diabetes mellitus   . Gout   . Hypertension    Social History   Socioeconomic History  . Marital status: Widowed    Spouse name: Not on file  . Number of children: Not on file  . Years of education: Not on file  . Highest education level: Not on file  Social Needs  . Financial resource strain: Not on file  . Food insecurity - worry: Not on file  . Food insecurity - inability: Not on file  . Transportation needs - medical: Not on file  . Transportation needs - non-medical: Not on file  Occupational History  . Not on file  Tobacco Use  . Smoking status: Former Smoker    Last attempt to quit: 10/01/1978    Years since quitting: 38.9  . Smokeless tobacco: Never Used  Substance and Sexual Activity  . Alcohol use: No  . Drug use: No  .  Sexual activity: Not on file  Other Topics Concern  . Not on file  Social History Narrative  . Not on file   Past Surgical History:  Procedure Laterality Date  . A/V FISTULAGRAM Left 08/17/2017   Procedure: A/V FISTULAGRAM;  Surgeon: Katha Cabal, MD;  Location: [place] [facility];  Service: Cardiovascular;  Laterality: Left;  . A/V SHUNT INTERVENTION N/A 08/17/2017   Procedure: A/V SHUNT INTERVENTION;  Surgeon: Katha Cabal, MD;  Location: [place] [facility];  Service: Cardiovascular;  Laterality: N/A;  . AV FISTULA PLACEMENT Left 05/31/2015   Procedure: ARTERIOVENOUS (AV)  FISTULA CREATION;  Surgeon: Katha Cabal, MD;  Location: [facility];  Service: Vascular;  Laterality: Left;  . excision bone spurs Bilateral 1989   feet  . KNEE SURGERY Left 1998   arthroscopy  . SHOULDER SURGERY Left 1994   rotator cuff   Family History  Problem Relation Age of Onset  . Kidney disease Mother   . Alcoholism Father   . Alcoholism Brother    No Known Allergies     Assessment & Plan:  The patient presents for his first post procedure follow-up.  The patient is status post a left upper extremity fistulogram on August 17, 2017.  The patient reports an improvement in the function of his fistula since his recent intervention. The patient underwent a duplex ultrasound of the AV access which was notable for a patent fistula without any significant hemodynamic stenosis. Patient reports his hemodialysis doppler flow is 980. The patient denies any issues with hemodialysis such as cannulation problems, increased bleeding, decrease in doppler flow or recirculation. The patient also denies any fistula skin breakdown, pain, edema, pallor or ulceration of the arm / hand.  1. ESRD on dialysis ([place]) - Stable Studies reviewed with patient. The patient is doing well and currently has adequate dialysis access. Duplex ultrasound of the AV access shows a patent access with no evidence of hemodynamically significant strictures or stenosis.  The patient should continue to have duplex ultrasounds of the dialysis access every three to four months. The patient was instructed to call the office in the interim if any issues with dialysis access / doppler flow, pain, edema, pallor, fistula skin breakdown or ulceration of the arm / hand occur. The patient expressed their understanding.  - VAS US DUPLEX DIALYSIS ACCESS (AVF,AVG); Future  2. Anemia in chronic kidney disease, unspecified CKD stage - Stable Patient presents asymptomatically Is followed by the patient's primary care physician and  nephrologist  Current Outpatient Medications on File Prior to Visit  Medication Sig Dispense Refill  . allopurinol (ZYLOPRIM) 100 MG tablet Take 100 mg by mouth every morning.     Marland Kitchen amLODipine (NORVASC) 10 MG tablet Take 10 mg by mouth every morning.     Marland Kitchen aspirin 81 MG tablet Take 81 mg by mouth every morning.    Marland Kitchen atorvastatin (LIPITOR) 40 MG tablet Take 40 mg by mouth every morning. Reported on 10/01/2015    . carvedilol (COREG) 25 MG tablet Take 25 mg by mouth 2 (two) times daily with a meal.      . doxazosin (CARDURA) 8 MG tablet Take 8 mg by mouth at bedtime.      Marland Kitchen glipiZIDE (GLUCOTROL) 5 MG tablet Take 5 mg by mouth 2 (two) times daily before a meal. Reported on 10/01/2015    . hydrALAZINE (APRESOLINE) 50 MG tablet Take 50 mg by mouth 2 (two) times daily.    Marland Kitchen HYDROcodone-acetaminophen (  NORCO/VICODIN) 5-325 MG per tablet Take 1 tablet by mouth every 6 (six) hours as needed for pain. 30 tablet 0  . indomethacin (INDOCIN) 50 MG capsule TAKE ONE CAPSULE BY MOUTH TWICE A DAY AS NEEDED FOR PAIN X5 DAYS  0  . insulin glargine (LANTUS) 100 UNIT/ML injection Inject 10 Units into the skin at bedtime.     Marland Kitchen losartan (COZAAR) 100 MG tablet Take 100 mg by mouth daily.      Marland Kitchen omeprazole (PRILOSEC) 20 MG capsule Take 20 mg by mouth every morning.     Glory Rosebush VERIO test strip USE 1 STRIP VIA METER ONCE A DAY TO CHECK BLOOD SUGAR  1  . potassium chloride (KLOR-CON) 10 MEQ CR tablet Take 10 mEq by mouth every morning. Reported on 10/01/2015    . torsemide (DEMADEX) 20 MG tablet Take 20 mg by mouth 2 (two) times daily.    . cyclobenzaprine (FLEXERIL) 5 MG tablet Take 1 tablet (5 mg total) by mouth 3 (three) times daily as needed. (Patient not taking: Reported on 08/17/2017) 30 tablet 0  . traMADol (ULTRAM) 50 MG tablet Take 2 tablets (100 mg total) by mouth every 6 (six) hours as needed. (Patient not taking: Reported on 08/17/2017) 16 tablet 0   No current facility-administered medications on file prior to  visit.    There are no Patient Instructions on file for this visit. No Follow-up on file.  KIMBERLY A STEGMAYER, PA-C

## 2017-11-04 IMAGING — CT CT BIOPSY
1 of 5 series · 14 of 32 positions shown, 19 images · non-contrast
Comparison: none

INDICATION: 69-year-old with anemia, unspecified anemia type. Patient has
history of chronic kidney disease.

[Series 2: bonemarrowbx · axial · 0.74mm/px · z∈[-194,-80]mm · 14 of 27 slices shown, 19 images]
[im 2/27  soft-tissue]
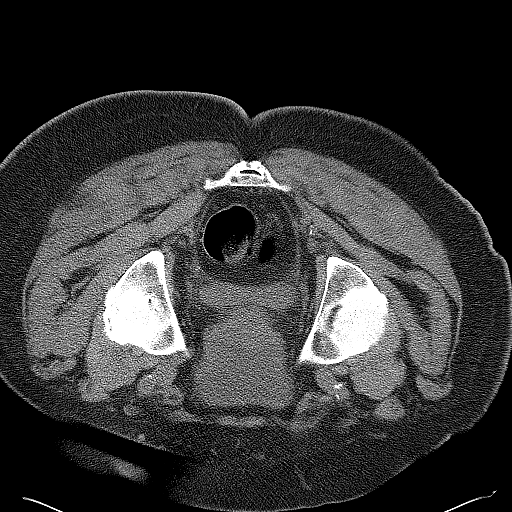
[im 2/27  bone]
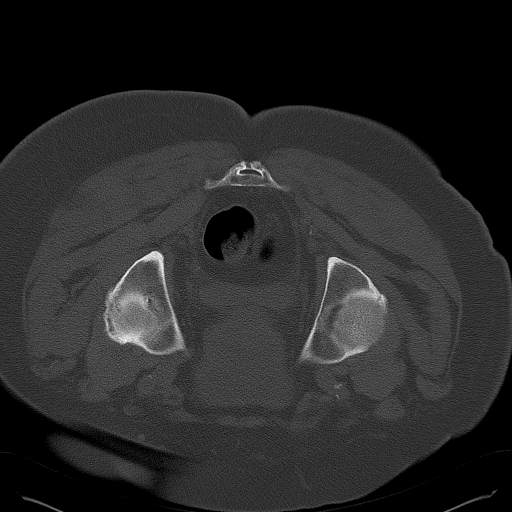
[im 4/27  soft-tissue]
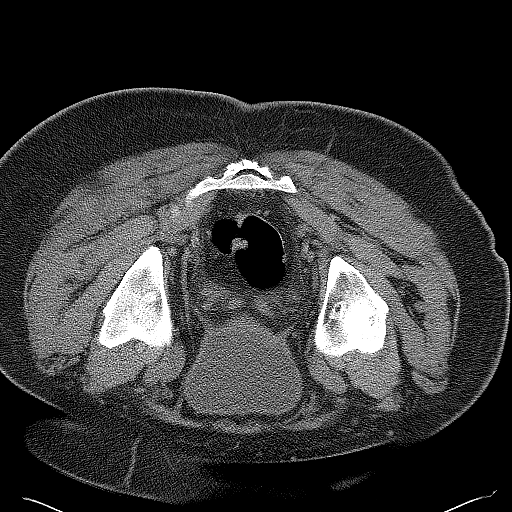
[im 6/27  soft-tissue]
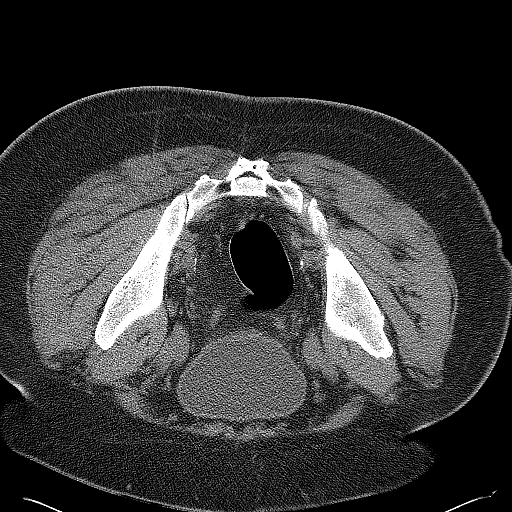
[im 8/27  soft-tissue]
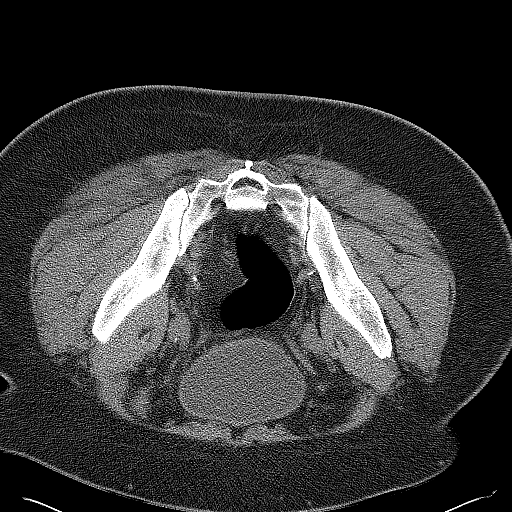
[im 10/27  soft-tissue]
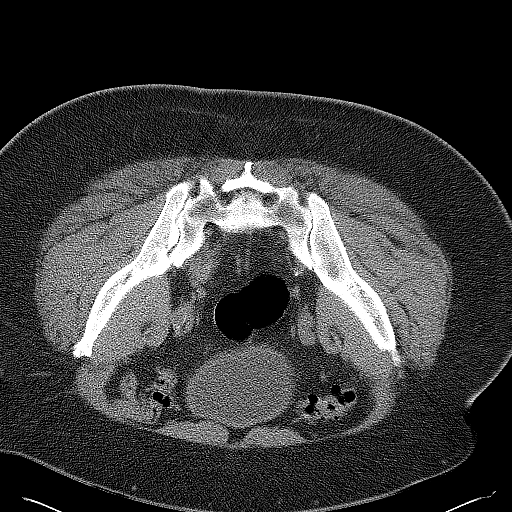
[im 12/27  soft-tissue]
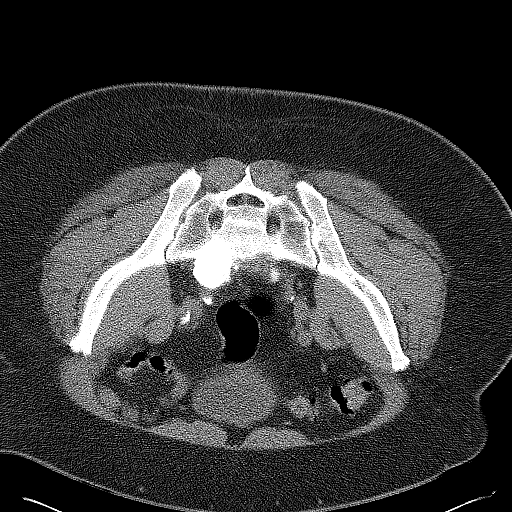
[im 14/27  soft-tissue]
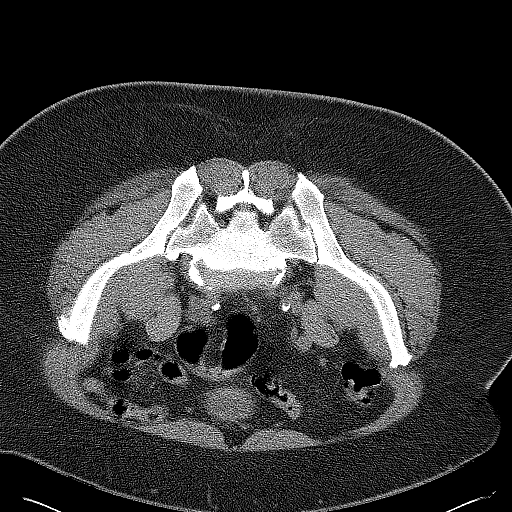
[im 15/27  soft-tissue]
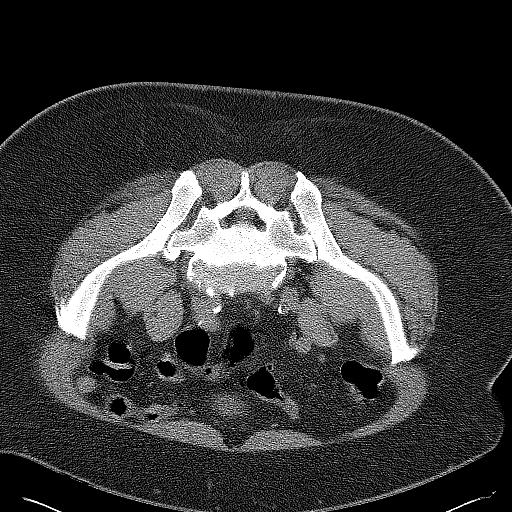
[im 17/27  soft-tissue]
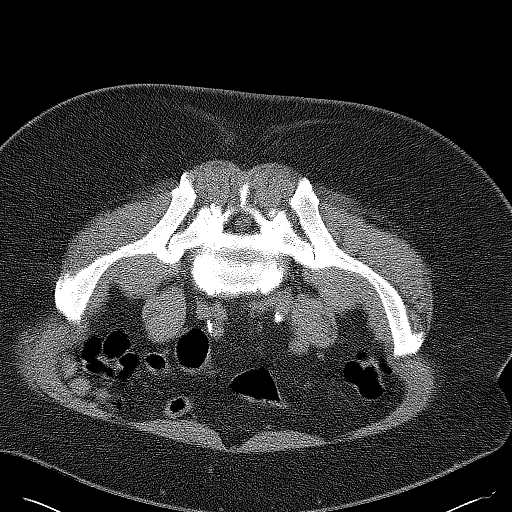
[im 17/27  bone]
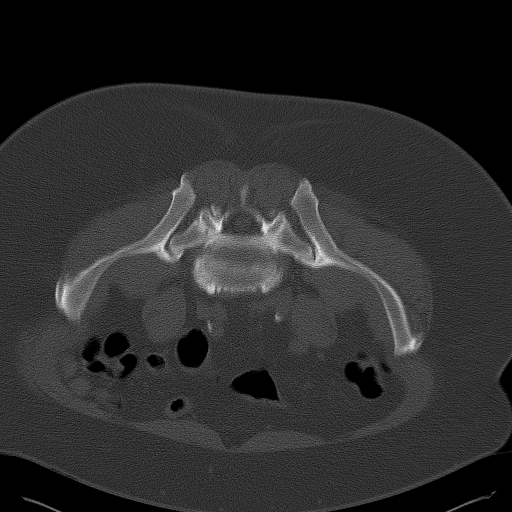
[im 19/27  soft-tissue]
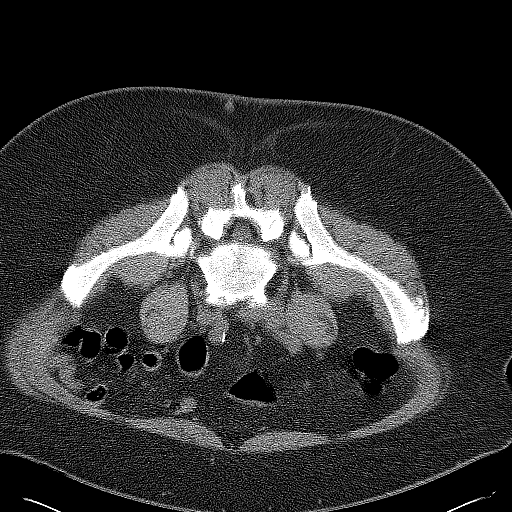
[im 21/27  soft-tissue]
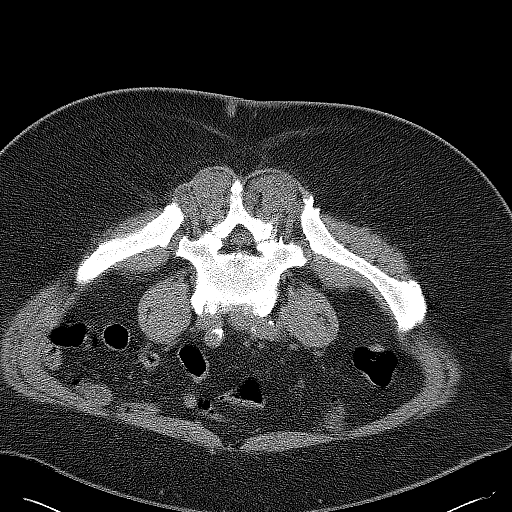
[im 21/27  lung]
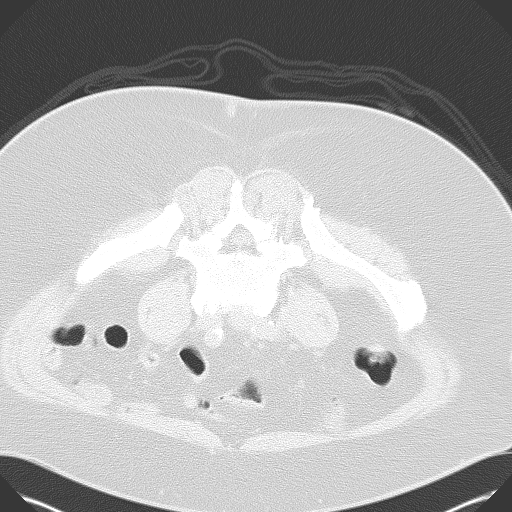
[im 23/27  soft-tissue]
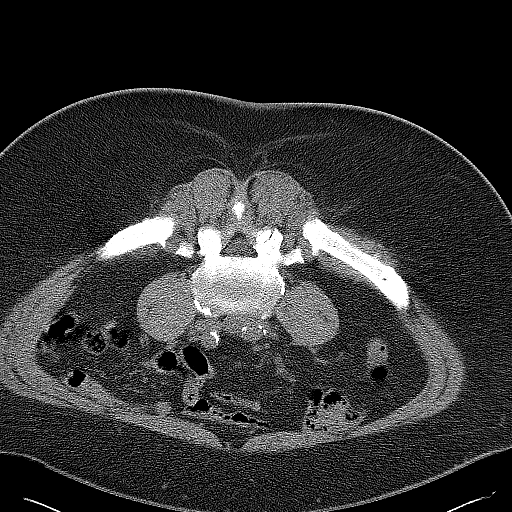
[im 23/27  lung]
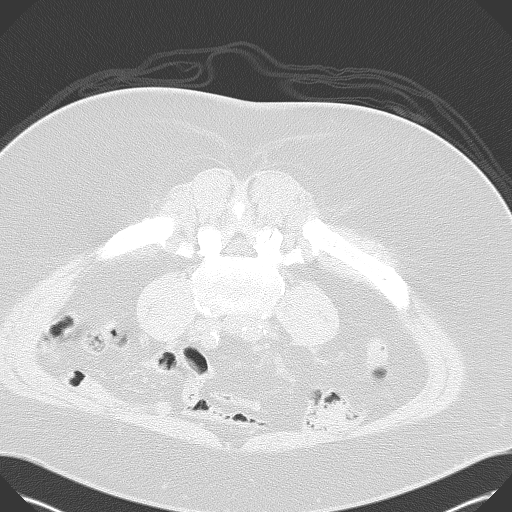
[im 24/27  lung]
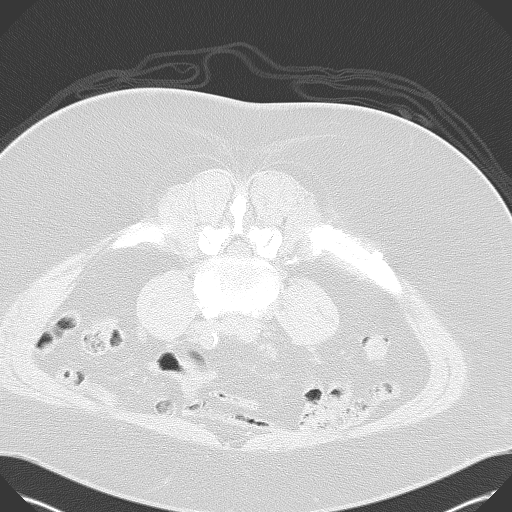
[im 25/27  soft-tissue]
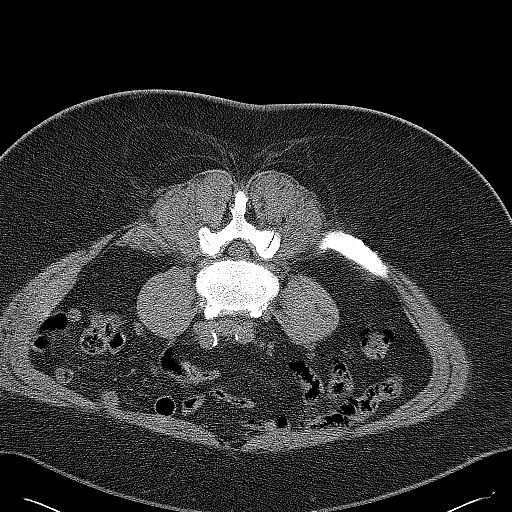
[im 25/27  lung]
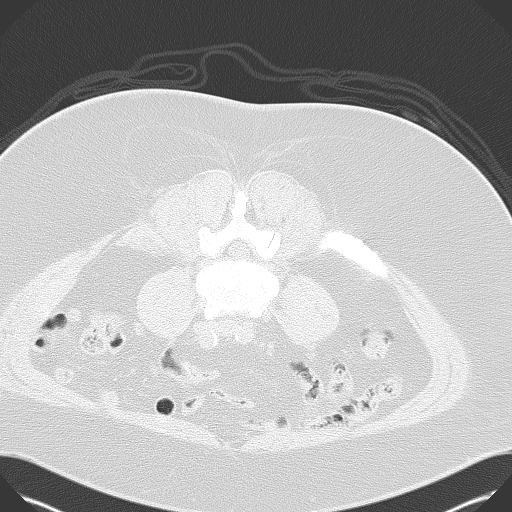

[14 of 32 positions shown; findings below may reference images not displayed]

EXAM:
CT GUIDED BONE MARROW ASPIRATES AND BIOPSY

MEDICATIONS:
None.

ANESTHESIA/SEDATION:
Fentanyl 2.0 mcg IV; Versed 75 mg IV

Moderate Sedation Time:  15 minutes

The patient was continuously monitored during the procedure by the
interventional radiology nurse under my direct supervision.

COMPLICATIONS:
None immediate.

PROCEDURE:
The procedure was explained to the patient. The risks and benefits
of the procedure were discussed and the patient's questions were
addressed. Informed consent was obtained from the patient. The
patient was placed prone on CT scan. Images of the pelvis were
obtained. The right side of back was prepped and draped in sterile
fashion. The skin and right posterior iliac bone were anesthetized
with 1% lidocaine. 11 gauge bone needle was directed into the right
iliac bone with CT guidance. Three aspirates and one core biopsy
obtained. Bandage placed over the puncture site.
FINDINGS: Bilateral pars defects at L5. Bone needle directed in the posterior
right ilium.
IMPRESSION: CT guided bone marrow aspirates and core biopsy.

## 2018-03-31 ENCOUNTER — Ambulatory Visit (INDEPENDENT_AMBULATORY_CARE_PROVIDER_SITE_OTHER): Payer: Medicare Other | Admitting: Vascular Surgery

## 2018-03-31 ENCOUNTER — Encounter (INDEPENDENT_AMBULATORY_CARE_PROVIDER_SITE_OTHER): Payer: Medicare Other

## 2018-08-03 IMAGING — DX DG CHEST 2V
2 series · 2 of 2 positions shown · non-contrast
Comparison: 02/28/2015.

CLINICAL DATA: Shortness of breath.

EXAM:
CHEST  2 VIEW

[chest pa]
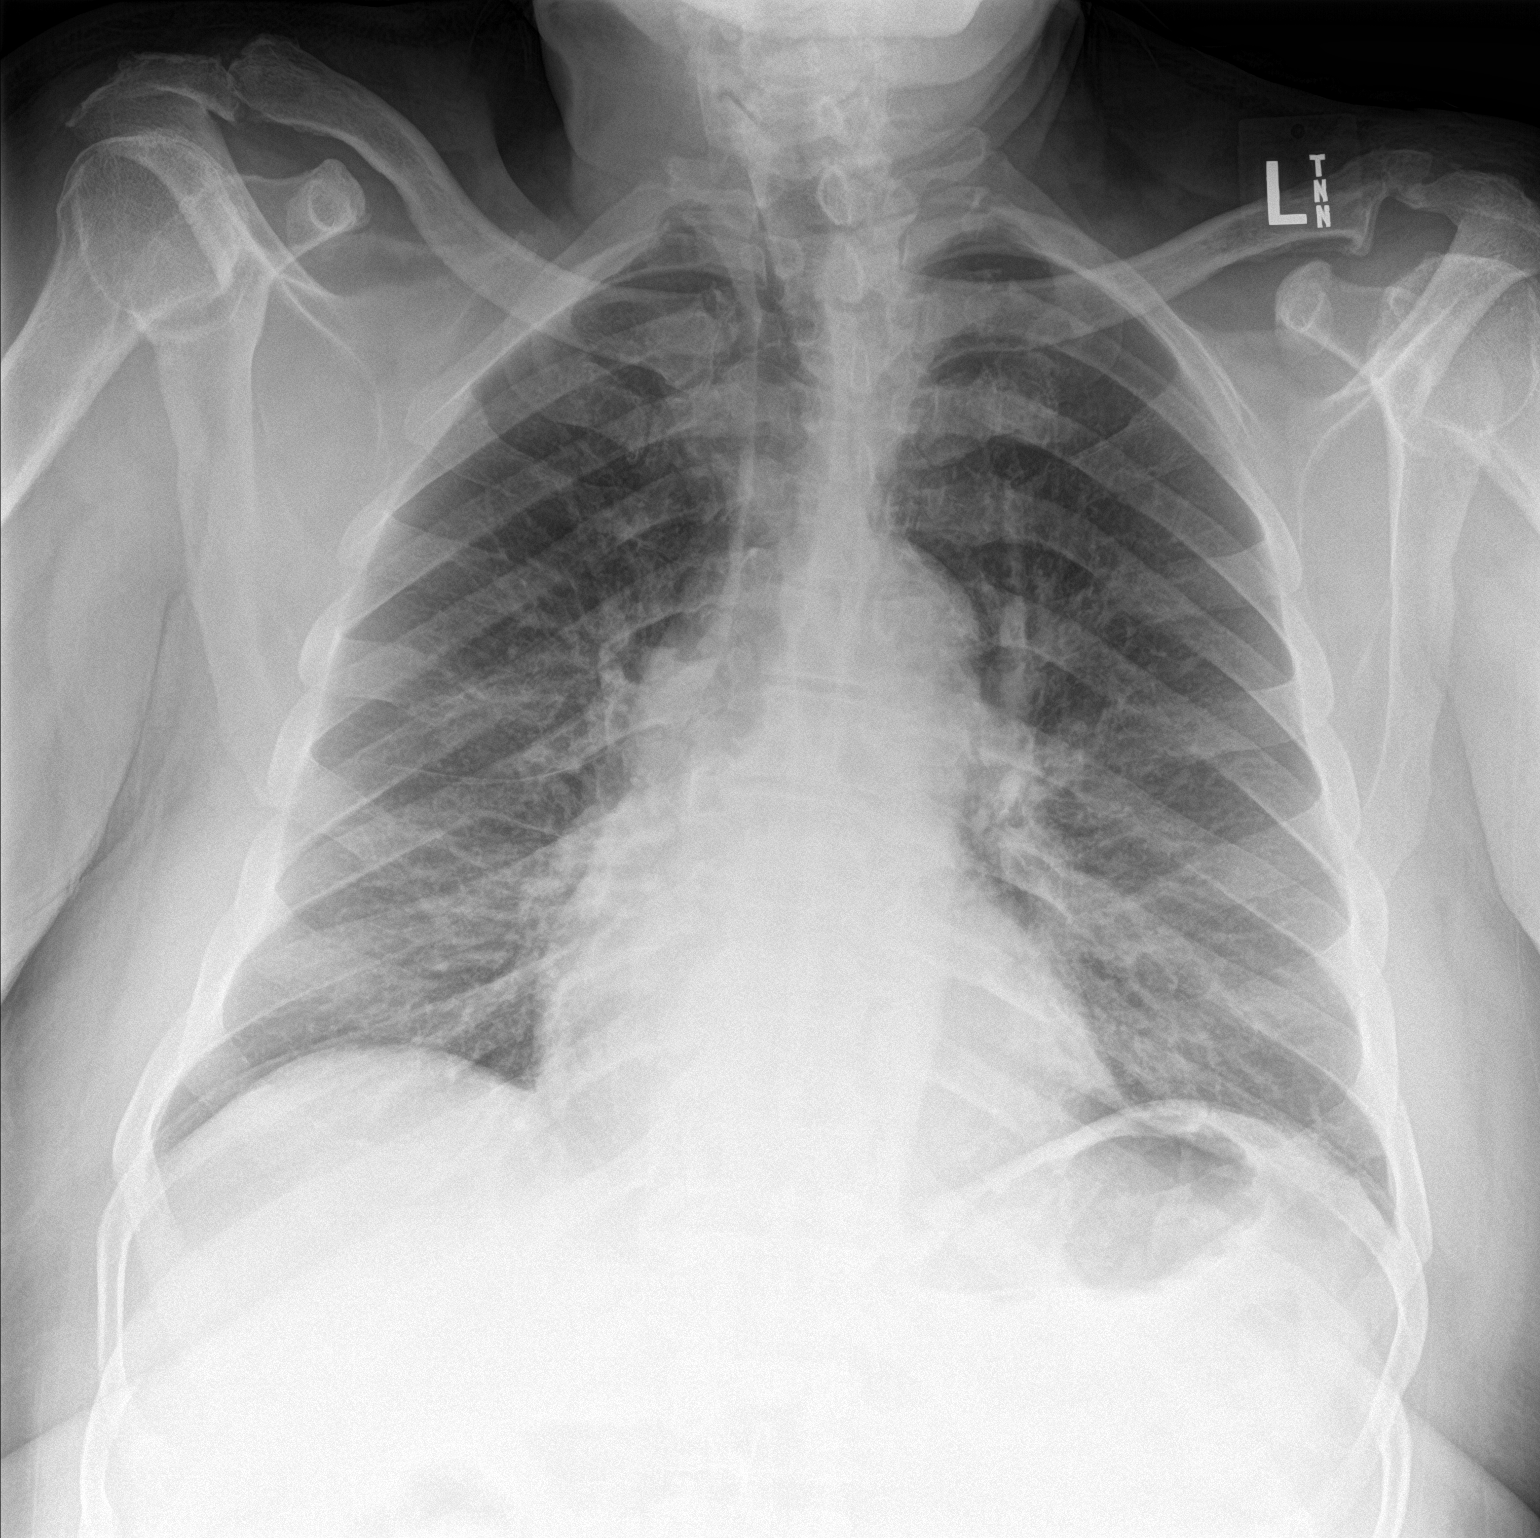

[chest lat]
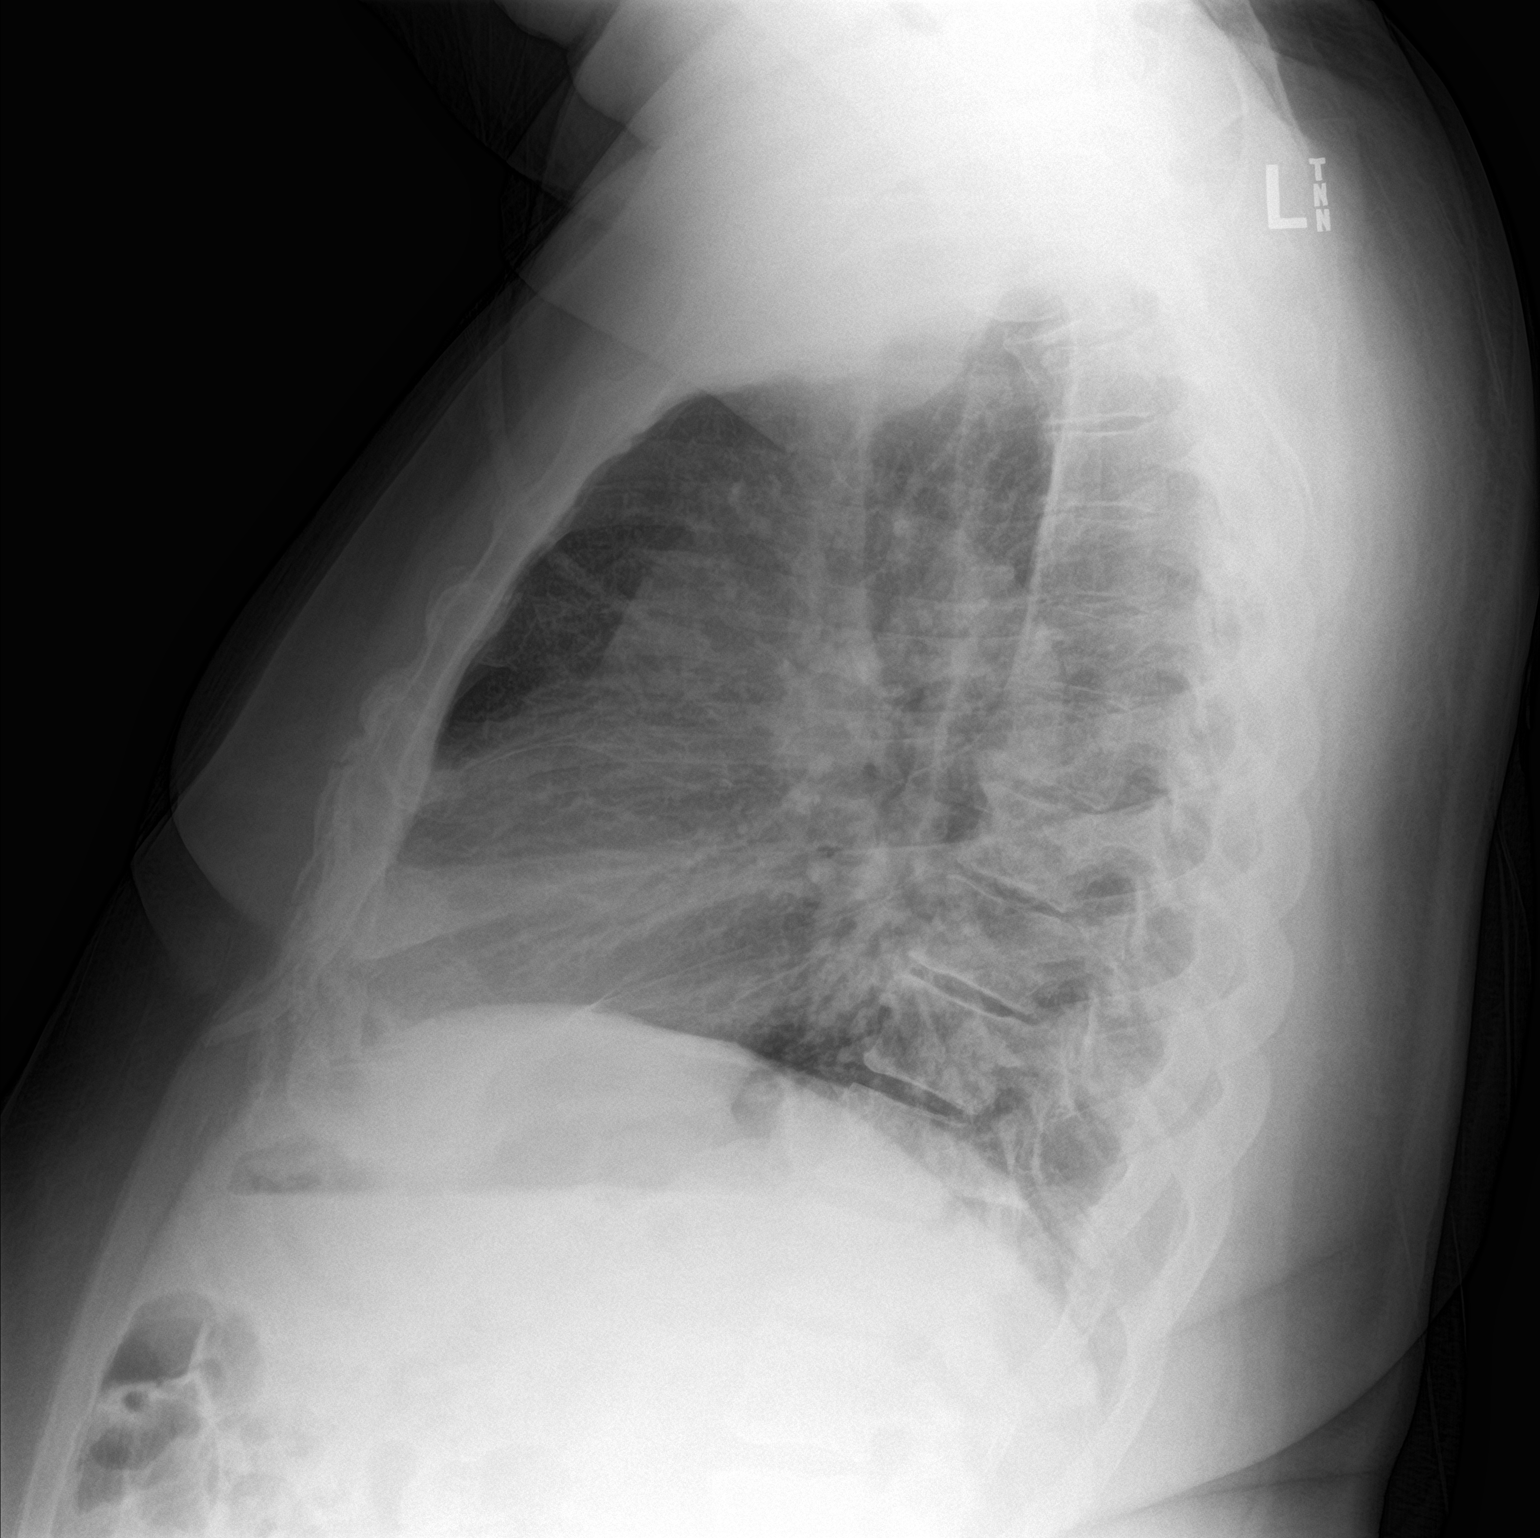

[2 of 2 positions shown; findings below may reference images not displayed]

FINDINGS: Mediastinum hilar structures normal. Mild bilateral from
interstitial prominence noted consistent pneumonitis. No pleural
effusion or pneumothorax.
IMPRESSION: Mild bilateral pulmonary interstitial prominence noted consistent
with pneumonitis.

## 2018-09-19 ENCOUNTER — Encounter (INDEPENDENT_AMBULATORY_CARE_PROVIDER_SITE_OTHER): Payer: Medicare Other

## 2018-09-19 ENCOUNTER — Ambulatory Visit (INDEPENDENT_AMBULATORY_CARE_PROVIDER_SITE_OTHER): Payer: Medicare Other | Admitting: Vascular Surgery

## 2018-10-19 DIAGNOSIS — E875 Hyperkalemia: Secondary | ICD-10-CM | POA: Insufficient documentation

## 2018-10-19 HISTORY — DX: Hyperkalemia: E87.5

## 2019-05-22 ENCOUNTER — Other Ambulatory Visit: Payer: Self-pay | Admitting: Internal Medicine

## 2019-05-22 DIAGNOSIS — I739 Peripheral vascular disease, unspecified: Secondary | ICD-10-CM

## 2019-05-30 ENCOUNTER — Other Ambulatory Visit: Payer: Medicare Other

## 2019-05-30 ENCOUNTER — Ambulatory Visit
Admission: RE | Admit: 2019-05-30 | Discharge: 2019-05-30 | Disposition: A | Discharge status: Home / Self Care | Payer: Medicare Other | Source: Ambulatory Visit | Attending: Internal Medicine | Admitting: Internal Medicine

## 2019-05-30 DIAGNOSIS — I739 Peripheral vascular disease, unspecified: Secondary | ICD-10-CM

## 2019-05-31 DIAGNOSIS — R770 Abnormality of albumin: Secondary | ICD-10-CM | POA: Insufficient documentation

## 2019-10-10 DIAGNOSIS — T7840XA Allergy, unspecified, initial encounter: Secondary | ICD-10-CM

## 2019-10-10 HISTORY — DX: Allergy, unspecified, initial encounter: T78.40XA

## 2019-10-24 ENCOUNTER — Other Ambulatory Visit: Payer: Self-pay

## 2019-10-24 ENCOUNTER — Encounter (INDEPENDENT_AMBULATORY_CARE_PROVIDER_SITE_OTHER): Payer: Self-pay | Admitting: Ophthalmology

## 2019-10-24 ENCOUNTER — Ambulatory Visit (INDEPENDENT_AMBULATORY_CARE_PROVIDER_SITE_OTHER): Payer: Medicare Other | Admitting: Ophthalmology

## 2019-10-24 DIAGNOSIS — E113551 Type 2 diabetes mellitus with stable proliferative diabetic retinopathy, right eye: Secondary | ICD-10-CM | POA: Insufficient documentation

## 2019-10-24 DIAGNOSIS — H4311 Vitreous hemorrhage, right eye: Secondary | ICD-10-CM | POA: Diagnosis not present

## 2019-10-24 DIAGNOSIS — E113591 Type 2 diabetes mellitus with proliferative diabetic retinopathy without macular edema, right eye: Secondary | ICD-10-CM | POA: Diagnosis not present

## 2019-10-24 DIAGNOSIS — H35371 Puckering of macula, right eye: Secondary | ICD-10-CM

## 2019-10-24 DIAGNOSIS — E113592 Type 2 diabetes mellitus with proliferative diabetic retinopathy without macular edema, left eye: Secondary | ICD-10-CM

## 2019-10-24 DIAGNOSIS — Z09 Encounter for follow-up examination after completed treatment for conditions other than malignant neoplasm: Secondary | ICD-10-CM

## 2019-10-24 DIAGNOSIS — H35351 Cystoid macular degeneration, right eye: Secondary | ICD-10-CM | POA: Diagnosis not present

## 2019-10-24 DIAGNOSIS — E113552 Type 2 diabetes mellitus with stable proliferative diabetic retinopathy, left eye: Secondary | ICD-10-CM | POA: Insufficient documentation

## 2019-10-24 HISTORY — DX: Vitreous hemorrhage, right eye: H43.11

## 2019-10-24 NOTE — Progress Notes (Signed)
10/24/2019     CHIEF COMPLAINT Patient presents for Post-op Follow-up   HISTORY OF PRESENT ILLNESS: Justin [name] is a 74 y.o. male who presents to the clinic today for:   HPI    Post-op Follow-up    In right eye.  Discomfort includes floaters.  Negative for pain.  Vision is blurred at distance.  I, the attending physician,  performed the HPI with the patient and updated documentation appropriately.          Comments    9 Week post op OD vitrectomy and panretinal laser right eye. OCT  Pt states OD is doing ok. PT c/o blurry DVA in OD. Pt also sees new floaters and FOL the last 2 days. Pt denies any pain.  BGL: 115 this AM A1C: "good"       Last edited by Hurman Horn, MD on 10/24/2019  9:06 AM. (History)      Referring physician: [address], [place] [address]  HISTORICAL INFORMATION:   Selected notes from the MEDICAL RECORD NUMBER    Lab Results  Component Value Date   HGBA1C 5.9 (H) 07/11/2011     CURRENT MEDICATIONS: Current Outpatient Medications (Ophthalmic Drugs)  Medication Sig  . ofloxacin (OCUFLOX) 0.3 % ophthalmic solution Place 1 drop into the right eye 4 (four) times daily.  . prednisoLONE acetate (PRED FORTE) 1 % ophthalmic suspension Place 1 drop into the right eye 4 (four) times daily.   No current facility-administered medications for this visit. (Ophthalmic Drugs)   Current Outpatient Medications (Other)  Medication Sig  . allopurinol (ZYLOPRIM) 100 MG tablet Take 100 mg by mouth every morning.   Marland Kitchen amLODipine (NORVASC) 10 MG tablet Take 10 mg by mouth every morning.   Marland Kitchen aspirin 81 MG tablet Take 81 mg by mouth every morning.  Marland Kitchen atorvastatin (LIPITOR) 40 MG tablet Take 40 mg by mouth every morning. Reported on 10/01/2015  . carvedilol (COREG) 25 MG tablet Take 25 mg by mouth 2 (two) times daily with a meal.    . cyclobenzaprine (FLEXERIL) 5 MG tablet Take 1 tablet (5 mg total) by mouth 3 (three) times  daily as needed.  . doxazosin (CARDURA) 8 MG tablet Take 8 mg by mouth at bedtime.    Marland Kitchen ELIQUIS 5 MG TABS tablet Take 5 mg by mouth 2 (two) times daily.  Marland Kitchen glipiZIDE (GLUCOTROL) 5 MG tablet Take 5 mg by mouth 2 (two) times daily before a meal. Reported on 10/01/2015  . HYDROcodone-acetaminophen (NORCO/VICODIN) 5-325 MG per tablet Take 1 tablet by mouth every 6 (six) hours as needed for pain.  . indomethacin (INDOCIN) 50 MG capsule TAKE ONE CAPSULE BY MOUTH TWICE A DAY AS NEEDED FOR PAIN X5 DAYS  . insulin glargine (LANTUS) 100 UNIT/ML injection Inject 10 Units into the skin at bedtime.   . Lancets (ONETOUCH DELICA PLUS [id number]) MISC Apply 1 each topically daily.  Marland Kitchen losartan (COZAAR) 100 MG tablet Take 100 mg by mouth daily.    Marland Kitchen omeprazole (PRILOSEC) 20 MG capsule Take 20 mg by mouth every morning.   Glory Rosebush VERIO test strip USE 1 STRIP VIA METER ONCE A DAY TO CHECK BLOOD SUGAR  . potassium chloride (KLOR-CON) 10 MEQ CR tablet Take 10 mEq by mouth every morning. Reported on 10/01/2015  . torsemide (DEMADEX) 20 MG tablet Take 20 mg by mouth 2 (two) times daily.  . traMADol (ULTRAM) 50 MG tablet Take 2 tablets (100  mg total) by mouth every 6 (six) hours as needed.  . VELPHORO 500 MG chewable tablet Chew 500 mg by mouth 3 (three) times daily.  Marland Kitchen allopurinol (ZYLOPRIM) 300 MG tablet Take 300 mg by mouth daily.  Marland Kitchen amLODipine (NORVASC) 10 MG tablet Take by mouth.  Marland Kitchen atorvastatin (LIPITOR) 40 MG tablet daily.  . hydrALAZINE (APRESOLINE) 100 MG tablet Bottle  . hydrALAZINE (APRESOLINE) 50 MG tablet Take 50 mg by mouth 2 (two) times daily.   No current facility-administered medications for this visit. (Other)      REVIEW OF SYSTEMS:    ALLERGIES No Known Allergies  PAST MEDICAL HISTORY Past Medical History:  Diagnosis Date  . Anemia   . Anemia in chronic kidney disease 11/20/2015  . Cancer [facility]) 2009   Prostate; radiation seeds  . Chronic kidney disease    stage 4  . Diabetes  mellitus   . Gout   . Hypertension    Past Surgical History:  Procedure Laterality Date  . A/V FISTULAGRAM Left 08/17/2017   Procedure: A/V FISTULAGRAM;  Surgeon: Katha Cabal, MD;  Location: [place] [facility];  Service: Cardiovascular;  Laterality: Left;  . A/V SHUNT INTERVENTION N/A 08/17/2017   Procedure: A/V SHUNT INTERVENTION;  Surgeon: Katha Cabal, MD;  Location: [place] [facility];  Service: Cardiovascular;  Laterality: N/A;  . AV FISTULA PLACEMENT Left 05/31/2015   Procedure: ARTERIOVENOUS (AV) FISTULA CREATION;  Surgeon: Katha Cabal, MD;  Location: [facility];  Service: Vascular;  Laterality: Left;  . excision bone spurs Bilateral 1989   feet  . KNEE SURGERY Left 1998   arthroscopy  . SHOULDER SURGERY Left 1994   rotator cuff    FAMILY HISTORY Family History  Problem Relation Age of Onset  . Kidney disease Mother   . Alcoholism Father   . Alcoholism Brother     SOCIAL HISTORY Social History   Tobacco Use  . Smoking status: Former Smoker    Quit date: 10/01/1978    Years since quitting: 41.0  . Smokeless tobacco: Never Used  Substance Use Topics  . Alcohol use: No  . Drug use: No         OPHTHALMIC EXAM:  Base Eye Exam    Visual Acuity (Snellen - Linear)      Right Left   Dist [place] 20/200 20/25 -2   Dist ph [place] NI        Tonometry (Tonopen, 8:21 AM)      Right Left   Pressure 9 10       Pupils      Pupils Dark Light Shape React APD   Right PERRL 2 2 Round Sluggish None   Left PERRL 2 2 Irregular Minimal None       Visual Fields (Counting fingers)      Left Right     Full   Restrictions Total superior nasal deficiency        Neuro/Psych    Oriented x3: Yes   Mood/Affect: Normal       Dilation    Both eyes: 1.0% Mydriacyl, 2.5% Phenylephrine @ 8:21 AM        Slit Lamp and Fundus Exam    External Exam      Right Left   External Normal,, lower leg extremity, trace edema Normal,, lower leg extremity, trace edema        Slit Lamp Exam      Right Left   Lids/Lashes Normal Normal   Conjunctiva/Sclera  White and quiet White and quiet   Cornea Clear Clear   Anterior Chamber Deep and quiet Deep and quiet   Iris Round and reactive Round and reactive   Lens  Clear   Anterior Vitreous Normal Normal       Fundus Exam      Right Left   Posterior Vitreous Normal, clear vitrectomized Posterior vitreous detachment clear   Disc Normal Normal   C/D Ratio 0.3-0.4 0.3-0.4   Macula Macular thickening, Epiretinal membrane, Cystoid macular edema no macular thickening   Vessels Quiesced sent proliferative diabetic retinopathy Quiesced sent proliferative diabetic retinopathy   Periphery Detached, good panretinal photocoagulation Good panretinal photocoagulation, 8          IMAGING AND PROCEDURES  Imaging and Procedures for @TODAY @  OCT, Retina - OU - Both Eyes       Right Eye Quality was good. Scan locations included subfoveal. Progression has worsened. Findings include cystoid macular edema, macular pucker.   Left Eye Quality was good. Scan locations included subfoveal. Progression has worsened. Findings include cystoid macular edema.   Notes OD with a recurrent clinically significant macular edema and perhaps some compounded of regular CME secondary to epiretinal membrane.  Will commence with antiveg F therapy initially.  OS has minor CME as well.         Fluorescein Angiography Optos (Transit OD)       Right Eye   Progression has worsened. Early phase findings include window defect, pooling, leakage. Mid/Late phase findings include leakage. Choroidal neovascularization is not present.   Left Eye Early phase findings include leakage. Choroidal neovascularization is not present.   Notes OD with a recurrent clinically significant macular edema and perhaps some compounded of regular CME secondary to epiretinal membrane.  Will commence with antiveg F therapy initially.  OS has minor CME as  well.        Color Fundus Photography Optos - OU - Both Eyes       Right Eye Progression has worsened. Disc findings include normal observations. Macula : microaneurysms.   Left Eye Progression has worsened. Macula : microaneurysms.   Notes OD with CSME.  Some topographic distortion from epiretinal membrane present OD.  Good panretinal photocoagulation 360 degrees.  OS similar findings with minor CME                  ASSESSMENT/PLAN:  @PROBAPNOTE @    ICD-10-CM   1. Macular pucker, right eye  H35.371 Fluorescein Angiography Optos (Transit OD)    Color Fundus Photography Optos - OU - Both Eyes  2. Follow-up examination after eye surgery  Z09   3. Vitreous hemorrhage of right eye ([place])  H43.11   4. 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 Proliferative diabetic retinopathy of left eye associated with type 2 diabetes mellitus, unspecified proliferative retinopathy type ([facility])  Y17.4944 OCT, Retina - OU - Both Eyes  6. Cystoid macular edema of right eye  H35.351 Fluorescein Angiography Optos (Transit OD)    Color Fundus Photography Optos - OU - Both Eyes    1.  2.  3.  Ophthalmic Meds Ordered this visit:  No orders of the defined types were placed in this encounter.      Return in about 1 week (around 10/31/2019), or Or less, for AVASTIN OCT, OD,, no dilated.  There are no Patient Instructions on file  for this visit.   Explained the diagnoses, plan, and follow up with the patient and they expressed understanding.  Patient expressed understanding of the importance of proper follow up care.   Clent Demark Louise Victory M.D. Diseases & Surgery of the Retina and Vitreous [facility]ina [facility] [place] @TODAY @     Abbreviations: M myopia (nearsighted); A astigmatism; H hyperopia (farsighted); P presbyopia; Mrx spectacle  prescription;  CTL contact lenses; OD right eye; OS left eye; OU both eyes  XT exotropia; ET esotropia; PEK punctate epithelial keratitis; PEE punctate epithelial erosions; DES dry eye syndrome; MGD meibomian gland dysfunction; ATs artificial tears; PFAT's preservative free artificial tears; [place] nuclear sclerotic cataract; PSC posterior subcapsular cataract; ERM epi-retinal membrane; PVD posterior vitreous detachment; RD retinal detachment; DM diabetes mellitus; DR diabetic retinopathy; NPDR non-proliferative diabetic retinopathy; PDR proliferative diabetic retinopathy; CSME clinically significant macular edema; DME diabetic macular edema; dbh dot blot hemorrhages; CWS cotton wool spot; POAG primary open angle glaucoma; C/D cup-to-disc ratio; HVF humphrey visual field; GVF goldmann visual field; OCT optical coherence tomography; IOP intraocular pressure; BRVO Branch retinal vein occlusion; CRVO central retinal vein occlusion; CRAO central retinal artery occlusion; BRAO branch retinal artery occlusion; RT retinal tear; SB scleral buckle; PPV pars plana vitrectomy; VH Vitreous hemorrhage; PRP panretinal laser photocoagulation; IVK intravitreal kenalog; VMT vitreomacular traction; MH Macular hole;  NVD neovascularization of the disc; NVE neovascularization elsewhere; AREDS age related eye disease study; ARMD age related macular degeneration; POAG primary open angle glaucoma; EBMD epithelial/anterior basement membrane dystrophy; ACIOL anterior chamber intraocular lens; IOL intraocular lens; PCIOL posterior chamber intraocular lens; Phaco/IOL phacoemulsification with intraocular lens placement; [place] photorefractive keratectomy; LASIK laser assisted in situ keratomileusis; HTN hypertension; DM diabetes mellitus; COPD chronic obstructive pulmonary disease

## 2019-10-26 ENCOUNTER — Encounter (INDEPENDENT_AMBULATORY_CARE_PROVIDER_SITE_OTHER): Payer: Medicare Other | Admitting: Ophthalmology

## 2019-10-26 ENCOUNTER — Other Ambulatory Visit: Payer: Self-pay

## 2019-11-02 ENCOUNTER — Ambulatory Visit (INDEPENDENT_AMBULATORY_CARE_PROVIDER_SITE_OTHER): Payer: Medicare Other | Admitting: Ophthalmology

## 2019-11-02 ENCOUNTER — Other Ambulatory Visit: Payer: Self-pay

## 2019-11-02 ENCOUNTER — Encounter (INDEPENDENT_AMBULATORY_CARE_PROVIDER_SITE_OTHER): Payer: Self-pay | Admitting: Ophthalmology

## 2019-11-02 DIAGNOSIS — E113511 Type 2 diabetes mellitus with proliferative diabetic retinopathy with macular edema, right eye: Secondary | ICD-10-CM

## 2019-11-02 MED ORDER — BEVACIZUMAB CHEMO INJECTION 1.25MG/0.05ML SYRINGE FOR KALEIDOSCOPE
1.2500 mg | INTRAVITREAL | Status: AC | PRN
  Administered 2019-11-02: 09:00:00 1.25 mg via INTRAVITREAL

## 2019-11-02 NOTE — Progress Notes (Signed)
11/02/2019     CHIEF COMPLAINT Patient presents for Diabetic Eye Exam   HISTORY OF PRESENT ILLNESS: Justin [name] is a 74 y.o. male who presents to the clinic today for:   HPI    Diabetic Eye Exam    Vision is stable.  Diabetes characteristics include Type 2.  This started 9 days ago.  Blood sugar level is controlled.  Last Blood Glucose: Not checked.          Comments    9 Day Avastin OD, no dilation  Pt denies noticeable changes to [place] OU since last visit. Pt denies ocular pain, flashes of light, or floaters OU.         Last edited by Rockie Neighbours, [place] on 11/02/2019  8:39 AM. (History)      Referring physician: Kendrick Ranch, [place] [address]  HISTORICAL INFORMATION:   Selected notes from the MEDICAL RECORD NUMBER    Lab Results  Component Value Date   HGBA1C 5.9 (H) 07/11/2011     CURRENT MEDICATIONS: Current Outpatient Medications (Ophthalmic Drugs)  Medication Sig  . ofloxacin (OCUFLOX) 0.3 % ophthalmic solution Place 1 drop into the right eye 4 (four) times daily.  . prednisoLONE acetate (PRED FORTE) 1 % ophthalmic suspension Place 1 drop into the right eye 4 (four) times daily.   No current facility-administered medications for this visit. (Ophthalmic Drugs)   Current Outpatient Medications (Other)  Medication Sig  . allopurinol (ZYLOPRIM) 100 MG tablet Take 100 mg by mouth every morning.   Marland Kitchen allopurinol (ZYLOPRIM) 300 MG tablet Take 300 mg by mouth daily.  Marland Kitchen amLODipine (NORVASC) 10 MG tablet Take 10 mg by mouth every morning.   Marland Kitchen amLODipine (NORVASC) 10 MG tablet Take by mouth.  Marland Kitchen aspirin 81 MG tablet Take 81 mg by mouth every morning.  Marland Kitchen atorvastatin (LIPITOR) 40 MG tablet Take 40 mg by mouth every morning. Reported on 10/01/2015  . atorvastatin (LIPITOR) 40 MG tablet daily.  . carvedilol (COREG) 25 MG tablet Take 25 mg by mouth 2 (two) times daily with a meal.    . cyclobenzaprine (FLEXERIL) 5 MG tablet  Take 1 tablet (5 mg total) by mouth 3 (three) times daily as needed.  . doxazosin (CARDURA) 8 MG tablet Take 8 mg by mouth at bedtime.    Marland Kitchen ELIQUIS 5 MG TABS tablet Take 5 mg by mouth 2 (two) times daily.  Marland Kitchen glipiZIDE (GLUCOTROL) 5 MG tablet Take 5 mg by mouth 2 (two) times daily before a meal. Reported on 10/01/2015  . hydrALAZINE (APRESOLINE) 100 MG tablet Bottle  . hydrALAZINE (APRESOLINE) 50 MG tablet Take 50 mg by mouth 2 (two) times daily.  Marland Kitchen HYDROcodone-acetaminophen (NORCO/VICODIN) 5-325 MG per tablet Take 1 tablet by mouth every 6 (six) hours as needed for pain.  . indomethacin (INDOCIN) 50 MG capsule TAKE ONE CAPSULE BY MOUTH TWICE A DAY AS NEEDED FOR PAIN X5 DAYS  . insulin glargine (LANTUS) 100 UNIT/ML injection Inject 10 Units into the skin at bedtime.   . Lancets (ONETOUCH DELICA PLUS [id number]) MISC Apply 1 each topically daily.  Marland Kitchen losartan (COZAAR) 100 MG tablet Take 100 mg by mouth daily.    Marland Kitchen omeprazole (PRILOSEC) 20 MG capsule Take 20 mg by mouth every morning.   Glory Rosebush VERIO test strip USE 1 STRIP VIA METER ONCE A DAY TO CHECK BLOOD SUGAR  . potassium chloride (KLOR-CON) 10 MEQ CR tablet Take 10 mEq by mouth  every morning. Reported on 10/01/2015  . torsemide (DEMADEX) 20 MG tablet Take 20 mg by mouth 2 (two) times daily.  . traMADol (ULTRAM) 50 MG tablet Take 2 tablets (100 mg total) by mouth every 6 (six) hours as needed.  . VELPHORO 500 MG chewable tablet Chew 500 mg by mouth 3 (three) times daily.   No current facility-administered medications for this visit. (Other)      REVIEW OF SYSTEMS:    ALLERGIES No Known Allergies  PAST MEDICAL HISTORY Past Medical History:  Diagnosis Date  . Anemia   . Anemia in chronic kidney disease 11/20/2015  . Cancer [facility]) 2009   Prostate; radiation seeds  . Chronic kidney disease    stage 4  . Diabetes mellitus   . Gout   . Hypertension    Past Surgical History:  Procedure Laterality Date  . A/V FISTULAGRAM Left  08/17/2017   Procedure: A/V FISTULAGRAM;  Surgeon: Katha Cabal, MD;  Location: [place] [facility];  Service: Cardiovascular;  Laterality: Left;  . A/V SHUNT INTERVENTION N/A 08/17/2017   Procedure: A/V SHUNT INTERVENTION;  Surgeon: Katha Cabal, MD;  Location: [place] [facility];  Service: Cardiovascular;  Laterality: N/A;  . AV FISTULA PLACEMENT Left 05/31/2015   Procedure: ARTERIOVENOUS (AV) FISTULA CREATION;  Surgeon: Katha Cabal, MD;  Location: [facility];  Service: Vascular;  Laterality: Left;  . excision bone spurs Bilateral 1989   feet  . KNEE SURGERY Left 1998   arthroscopy  . SHOULDER SURGERY Left 1994   rotator cuff    FAMILY HISTORY Family History  Problem Relation Age of Onset  . Kidney disease Mother   . Alcoholism Father   . Alcoholism Brother     SOCIAL HISTORY Social History   Tobacco Use  . Smoking status: Former Smoker    Quit date: 10/01/1978    Years since quitting: 41.1  . Smokeless tobacco: Never Used  Substance Use Topics  . Alcohol use: No  . Drug use: No         OPHTHALMIC EXAM:  Base Eye Exam    Visual Acuity (Snellen - Linear)      Right Left   Dist [place] 20/200 +1 20/25 -2   Dist ph [place] NI        Tonometry (Tonopen, 8:43 AM)      Right Left   Pressure 15 18       Pupils      Dark Light Shape React APD   Right 3 2 Round Slow None   Left 3 2 Irregular Slow None       Visual Fields (Counting fingers)      Left Right    Full Full       Extraocular Movement      Right Left    Full Full       Neuro/Psych    Oriented x3: Yes   Mood/Affect: Normal       Dilation    deferred         Slit Lamp and Fundus Exam    External Exam      Right Left   External Normal,, lower leg extremity, trace edema Normal,, lower leg extremity, trace edema       Slit Lamp Exam      Right Left   Lids/Lashes Normal Normal   Conjunctiva/Sclera White and quiet White and quiet   Cornea Clear Clear   Anterior Chamber Deep and  quiet Deep and  quiet   Iris Round and reactive Round and reactive   Lens  Clear   Anterior Vitreous Normal Normal          IMAGING AND PROCEDURES  Imaging and Procedures for @TODAY @  Intravitreal Injection, Pharmacologic Agent - OD - Right Eye       Time Out 11/02/2019. 9:17 AM. Confirmed correct patient, procedure, site, and patient consented.   Anesthesia Topical anesthesia was used. Anesthetic medications included [place] 3.5%.   Procedure Preparation included Ofloxacin , 10% betadine to eyelids. A 30 gauge needle was used.   Injection:  1.25 mg Bevacizumab (AVASTIN) SOLN   NDC: [address]-4, Lot: [id number]   Route: Intravitreal, Site: Right Eye, Waste: 0 mg  Post-op Post injection exam found visual acuity of at least counting fingers. The patient tolerated the procedure well. There were no complications. The patient received written and verbal post procedure care education. Post injection medications were not given.                 ASSESSMENT/PLAN:  No problem-specific Assessment & Plan notes found for this encounter.      ICD-10-CM   1. 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  Intravitreal Avastin OD today for the diabetic macular edema component.  2.  If CSME improves and vision does not improve attention may be required for the epiretinal membrane on the macula right eye. 3.  Ophthalmic Meds Ordered this visit:  Meds ordered this encounter  Medications  . Bevacizumab (AVASTIN) SOLN 1.25 mg       Return in about 5 weeks (around 12/07/2019) for AVASTIN OCT, OCT, OD.  There are no Patient Instructions on file for this visit.   Explained the diagnoses, plan, and follow up with the patient and they expressed understanding.  Patient expressed understanding of the  importance of proper follow up care.   Clent Demark Toshie Demelo M.D. Diseases & Surgery of the Retina and Vitreous Retina & Diabetic [place] @TODAY @     Abbreviations: M myopia (nearsighted); A astigmatism; H hyperopia (farsighted); P presbyopia; Mrx spectacle prescription;  CTL contact lenses; OD right eye; OS left eye; OU both eyes  XT exotropia; ET esotropia; PEK punctate epithelial keratitis; PEE punctate epithelial erosions; DES dry eye syndrome; MGD meibomian gland dysfunction; ATs artificial tears; PFAT's preservative free artificial tears; [place] nuclear sclerotic cataract; PSC posterior subcapsular cataract; ERM epi-retinal membrane; PVD posterior vitreous detachment; RD retinal detachment; DM diabetes mellitus; DR diabetic retinopathy; NPDR non-proliferative diabetic retinopathy; PDR proliferative diabetic retinopathy; CSME clinically significant macular edema; DME diabetic macular edema; dbh dot blot hemorrhages; CWS cotton wool spot; POAG primary open angle glaucoma; C/D cup-to-disc ratio; HVF humphrey visual field; GVF goldmann visual field; OCT optical coherence tomography; IOP intraocular pressure; BRVO Branch retinal vein occlusion; CRVO central retinal vein occlusion; CRAO central retinal artery occlusion; BRAO branch retinal artery occlusion; RT retinal tear; SB scleral buckle; PPV pars plana vitrectomy; VH Vitreous hemorrhage; PRP panretinal laser photocoagulation; IVK intravitreal kenalog; VMT vitreomacular traction; MH Macular hole;  NVD neovascularization of the disc; NVE neovascularization elsewhere; AREDS age related eye disease study; ARMD age related macular degeneration; POAG primary open angle glaucoma; EBMD epithelial/anterior basement membrane dystrophy; ACIOL anterior chamber intraocular lens; IOL intraocular lens; PCIOL posterior chamber intraocular lens; Phaco/IOL phacoemulsification with intraocular lens placement; PRK photorefractive  keratectomy; LASIK laser assisted in situ  keratomileusis; HTN hypertension; DM diabetes mellitus; COPD chronic obstructive pulmonary disease

## 2019-12-07 ENCOUNTER — Ambulatory Visit (INDEPENDENT_AMBULATORY_CARE_PROVIDER_SITE_OTHER): Payer: Medicare Other | Admitting: Ophthalmology

## 2019-12-07 ENCOUNTER — Other Ambulatory Visit: Payer: Self-pay

## 2019-12-07 ENCOUNTER — Encounter (INDEPENDENT_AMBULATORY_CARE_PROVIDER_SITE_OTHER): Payer: Self-pay | Admitting: Ophthalmology

## 2019-12-07 DIAGNOSIS — H35371 Puckering of macula, right eye: Secondary | ICD-10-CM | POA: Diagnosis not present

## 2019-12-07 DIAGNOSIS — E113511 Type 2 diabetes mellitus with proliferative diabetic retinopathy with macular edema, right eye: Secondary | ICD-10-CM

## 2019-12-07 DIAGNOSIS — Z9889 Other specified postprocedural states: Secondary | ICD-10-CM | POA: Diagnosis not present

## 2019-12-07 HISTORY — DX: Type 2 diabetes mellitus with proliferative diabetic retinopathy with macular edema, right eye: E11.3511

## 2019-12-07 MED ORDER — BEVACIZUMAB CHEMO INJECTION 1.25MG/0.05ML SYRINGE FOR KALEIDOSCOPE
1.2500 mg | INTRAVITREAL | Status: AC | PRN
  Administered 2019-12-07: 1.25 mg via INTRAVITREAL

## 2019-12-07 NOTE — Assessment & Plan Note (Signed)
The nature of macular pucker (epiretinal membrane ERM) was discussed with the patient as well as threshold criteria for vitrectomy surgery. I explained that in rare cases another surgery is needed to actually remove a second wrinkle should it regrow.  Most often, the epiretinal membrane and underlying wrinkled internal limiting membrane are removed with the first surgery, to accomplish the goals.   If the operative eye is [place] (natural lens still present), cataract surgery is often recommended prior to Vitrectomy. This will enable the retina surgeon to have the best view during surgery and the patient to obtain optimal results in the future. Treatment options were discussed.  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

## 2019-12-07 NOTE — Assessment & Plan Note (Signed)
The nature of diabetic macular edema was discussed with the patient. Treatment options were outlined including medical therapy, laser & vitrectomy. The use of injectable medications reviewed, including Avastin, Lucentis, and Eylea. Periodic injections into the eye are likely to resolve diabetic macular edema (swelling in the center of vision). Initially, injections are delivered are delivered every 4-6 weeks, and the interval extended as the condition improves. On average, 8-9 injections the first year, and 5 in year 2. Improvement in the condition most often improves on medical therapy. Occasional use of focal laser is also recommended for residual macular edema (swelling). Excellent control of blood glucose and blood pressure are encouraged under the care of a primary physician or endocrinologist. Similarly, attempts to maintain serum cholesterol, low density lipoproteins, and high-density lipoproteins in a favorable range were recommended.  OD much less active on intravitreal Avastin

## 2019-12-07 NOTE — Progress Notes (Signed)
12/07/2019     CHIEF COMPLAINT Patient presents for Diabetic Eye Exam   HISTORY OF PRESENT ILLNESS: Justin [name] is a 74 y.o. male who presents to the clinic today for:   HPI    Diabetic Eye Exam    Vision is stable.  Associated Symptoms Flashes and Floaters.  Diabetes characteristics include Type 2.  Blood sugar level is controlled.  Last Blood Glucose 131.  Last A1C 6.7.  I, the attending physician,  performed the HPI with the patient and updated documentation appropriately.          Comments    5 Week Daibetic Exam OD. Possible Avastin OD. OCT  Pt states vision is stable. Pt sees FOL and floaters in OD.       Last edited by Tilda Franco on 12/07/2019  8:17 AM. (History)      Referring physician: Kendrick Ranch, [place] [address]  HISTORICAL INFORMATION:   Selected notes from the MEDICAL RECORD NUMBER    Lab Results  Component Value Date   HGBA1C 5.9 (H) 07/11/2011     CURRENT MEDICATIONS: Current Outpatient Medications (Ophthalmic Drugs)  Medication Sig  . ofloxacin (OCUFLOX) 0.3 % ophthalmic solution Place 1 drop into the right eye 4 (four) times daily.  . prednisoLONE acetate (PRED FORTE) 1 % ophthalmic suspension Place 1 drop into the right eye 4 (four) times daily.   No current facility-administered medications for this visit. (Ophthalmic Drugs)   Current Outpatient Medications (Other)  Medication Sig  . allopurinol (ZYLOPRIM) 100 MG tablet Take 100 mg by mouth every morning.   Marland Kitchen allopurinol (ZYLOPRIM) 300 MG tablet Take 300 mg by mouth daily.  Marland Kitchen amLODipine (NORVASC) 10 MG tablet Take 10 mg by mouth every morning.   Marland Kitchen amLODipine (NORVASC) 10 MG tablet Take by mouth.  Marland Kitchen aspirin 81 MG tablet Take 81 mg by mouth every morning.  Marland Kitchen atorvastatin (LIPITOR) 40 MG tablet Take 40 mg by mouth every morning. Reported on 10/01/2015  . atorvastatin (LIPITOR) 40 MG tablet daily.  . carvedilol (COREG) 25 MG tablet Take 25 mg  by mouth 2 (two) times daily with a meal.    . cyclobenzaprine (FLEXERIL) 5 MG tablet Take 1 tablet (5 mg total) by mouth 3 (three) times daily as needed.  . doxazosin (CARDURA) 8 MG tablet Take 8 mg by mouth at bedtime.    Marland Kitchen ELIQUIS 5 MG TABS tablet Take 5 mg by mouth 2 (two) times daily.  Marland Kitchen glipiZIDE (GLUCOTROL) 5 MG tablet Take 5 mg by mouth 2 (two) times daily before a meal. Reported on 10/01/2015  . hydrALAZINE (APRESOLINE) 100 MG tablet Bottle  . hydrALAZINE (APRESOLINE) 50 MG tablet Take 50 mg by mouth 2 (two) times daily.  Marland Kitchen HYDROcodone-acetaminophen (NORCO/VICODIN) 5-325 MG per tablet Take 1 tablet by mouth every 6 (six) hours as needed for pain.  . indomethacin (INDOCIN) 50 MG capsule TAKE ONE CAPSULE BY MOUTH TWICE A DAY AS NEEDED FOR PAIN X5 DAYS  . insulin glargine (LANTUS) 100 UNIT/ML injection Inject 10 Units into the skin at bedtime.   . Lancets (ONETOUCH DELICA PLUS [id number]) MISC Apply 1 each topically daily.  Marland Kitchen losartan (COZAAR) 100 MG tablet Take 100 mg by mouth daily.    Marland Kitchen omeprazole (PRILOSEC) 20 MG capsule Take 20 mg by mouth every morning.   Glory Rosebush VERIO test strip USE 1 STRIP VIA METER ONCE A DAY TO CHECK BLOOD SUGAR  .  potassium chloride (KLOR-CON) 10 MEQ CR tablet Take 10 mEq by mouth every morning. Reported on 10/01/2015  . torsemide (DEMADEX) 20 MG tablet Take 20 mg by mouth 2 (two) times daily.  . traMADol (ULTRAM) 50 MG tablet Take 2 tablets (100 mg total) by mouth every 6 (six) hours as needed.  . VELPHORO 500 MG chewable tablet Chew 500 mg by mouth 3 (three) times daily.   No current facility-administered medications for this visit. (Other)      REVIEW OF SYSTEMS:    ALLERGIES No Known Allergies  PAST MEDICAL HISTORY Past Medical History:  Diagnosis Date  . Anemia   . Anemia in chronic kidney disease 11/20/2015  . Cancer [facility]) 2009   Prostate; radiation seeds  . Chronic kidney disease    stage 4  . Diabetes mellitus   . Gout   .  Hypertension    Past Surgical History:  Procedure Laterality Date  . A/V FISTULAGRAM Left 08/17/2017   Procedure: A/V FISTULAGRAM;  Surgeon: Katha Cabal, MD;  Location: [place] [facility];  Service: Cardiovascular;  Laterality: Left;  . A/V SHUNT INTERVENTION N/A 08/17/2017   Procedure: A/V SHUNT INTERVENTION;  Surgeon: Katha Cabal, MD;  Location: [place] [facility];  Service: Cardiovascular;  Laterality: N/A;  . AV FISTULA PLACEMENT Left 05/31/2015   Procedure: ARTERIOVENOUS (AV) FISTULA CREATION;  Surgeon: Katha Cabal, MD;  Location: [facility];  Service: Vascular;  Laterality: Left;  . excision bone spurs Bilateral 1989   feet  . KNEE SURGERY Left 1998   arthroscopy  . SHOULDER SURGERY Left 1994   rotator cuff    FAMILY HISTORY Family History  Problem Relation Age of Onset  . Kidney disease Mother   . Alcoholism Father   . Alcoholism Brother     SOCIAL HISTORY Social History   Tobacco Use  . Smoking status: Former Smoker    Quit date: 10/01/1978    Years since quitting: 41.2  . Smokeless tobacco: Never Used  Substance Use Topics  . Alcohol use: No  . Drug use: No         OPHTHALMIC EXAM: Base Eye Exam    Visual Acuity (Snellen - Linear)      Right Left   Dist [place] E Card @ 4' 20/30   Dist ph [place] NI        Tonometry (Tonopen, 8:21 AM)      Right Left   Pressure 14 16       Pupils      Dark Light Shape React APD   Right 3 2 Round Slow None   Left 3 2 Irregular Slow None       Visual Fields (Counting fingers)      Left Right    Full Full       Neuro/Psych    Oriented x3: Yes   Mood/Affect: Normal       Dilation    Right eye: 1.0% Mydriacyl, 2.5% Phenylephrine @ 8:21 AM        Slit Lamp and Fundus Exam    External Exam      Right Left   External Normal,, lower leg extremity, trace edema Normal,, lower leg extremity, trace edema       Slit Lamp Exam      Right Left   Lids/Lashes Normal Normal   Conjunctiva/Sclera White  and quiet White and quiet   Cornea Clear Clear   Anterior Chamber Deep and quiet Deep and quiet  Iris Round and reactive Round and reactive   Lens  Clear   Anterior Vitreous Normal Normal          IMAGING AND PROCEDURES  Imaging and Procedures for 12/07/19           ASSESSMENT/PLAN:  No problem-specific Assessment & Plan notes found for this encounter.      ICD-10-CM   1. 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  2.  3.  Ophthalmic Meds Ordered this visit:  No orders of the defined types were placed in this encounter.      No follow-ups on file.  There are no Patient Instructions on file for this visit.   Explained the diagnoses, plan, and follow up with the patient and they expressed understanding.  Patient expressed understanding of the importance of proper follow up care.   Clent Demark Fredrico Beedle M.D. Diseases & Surgery of the Retina and Vitreous Retina & Diabetic [place] 12/07/19     Abbreviations: M myopia (nearsighted); A astigmatism; H hyperopia (farsighted); P presbyopia; Mrx spectacle prescription;  CTL contact lenses; OD right eye; OS left eye; OU both eyes  XT exotropia; ET esotropia; PEK punctate epithelial keratitis; PEE punctate epithelial erosions; DES dry eye syndrome; MGD meibomian gland dysfunction; ATs artificial tears; PFAT's preservative free artificial tears; [place] nuclear sclerotic cataract; PSC posterior subcapsular cataract; ERM epi-retinal membrane; PVD posterior vitreous detachment; RD retinal detachment; DM diabetes mellitus; DR diabetic retinopathy; NPDR non-proliferative diabetic retinopathy; PDR proliferative diabetic retinopathy; CSME clinically significant macular edema; DME diabetic macular edema; dbh dot blot hemorrhages; CWS cotton wool spot; POAG primary open angle glaucoma; C/D cup-to-disc ratio; HVF humphrey  visual field; GVF goldmann visual field; OCT optical coherence tomography; IOP intraocular pressure; BRVO Branch retinal vein occlusion; CRVO central retinal vein occlusion; CRAO central retinal artery occlusion; BRAO branch retinal artery occlusion; RT retinal tear; SB scleral buckle; PPV pars plana vitrectomy; VH Vitreous hemorrhage; PRP panretinal laser photocoagulation; IVK intravitreal kenalog; VMT vitreomacular traction; MH Macular hole;  NVD neovascularization of the disc; NVE neovascularization elsewhere; AREDS age related eye disease study; ARMD age related macular degeneration; POAG primary open angle glaucoma; EBMD epithelial/anterior basement membrane dystrophy; ACIOL anterior chamber intraocular lens; IOL intraocular lens; PCIOL posterior chamber intraocular lens; Phaco/IOL phacoemulsification with intraocular lens placement; [place] photorefractive keratectomy; LASIK laser assisted in situ keratomileusis; HTN hypertension; DM diabetes mellitus; COPD chronic obstructive pulmonary disease

## 2019-12-27 ENCOUNTER — Encounter (INDEPENDENT_AMBULATORY_CARE_PROVIDER_SITE_OTHER): Payer: Medicare Other | Admitting: Ophthalmology

## 2019-12-28 ENCOUNTER — Ambulatory Visit (INDEPENDENT_AMBULATORY_CARE_PROVIDER_SITE_OTHER): Payer: Medicare Other | Admitting: Ophthalmology

## 2019-12-28 ENCOUNTER — Encounter (INDEPENDENT_AMBULATORY_CARE_PROVIDER_SITE_OTHER): Payer: Self-pay | Admitting: Ophthalmology

## 2019-12-28 ENCOUNTER — Other Ambulatory Visit: Payer: Self-pay

## 2019-12-28 DIAGNOSIS — E113511 Type 2 diabetes mellitus with proliferative diabetic retinopathy with macular edema, right eye: Secondary | ICD-10-CM

## 2019-12-28 DIAGNOSIS — H35371 Puckering of macula, right eye: Secondary | ICD-10-CM

## 2019-12-28 NOTE — Assessment & Plan Note (Signed)
This condition progressing with retinal thickening in the macula and severe topographic distortion.  We will need to proceed to vitrectomy with membrane peel right eye.  This would be an attempt to see to improve visual acuity and functioning

## 2019-12-28 NOTE — Progress Notes (Signed)
12/28/2019     CHIEF COMPLAINT Patient presents for Retina Follow Up   HISTORY OF PRESENT ILLNESS: Justin [name] is a 74 y.o. male who presents to the clinic today for:   HPI    Retina Follow Up    Patient presents with  Other.  In right eye.  Severity is moderate.  Duration of 3 weeks.  Since onset it is stable.  I, the attending physician,  performed the HPI with the patient and updated documentation appropriately.          Comments    3 Week f\u OD. OCT  Pt states his eyes are not any better.   Did not check BGL       Last edited by Hurman Horn, MD on 12/28/2019  9:48 AM. (History)      Referring physician: [facility], [place] [address]  HISTORICAL INFORMATION:   Selected notes from the MEDICAL RECORD NUMBER    Lab Results  Component Value Date   HGBA1C 5.9 (H) 07/11/2011     CURRENT MEDICATIONS: Current Outpatient Medications (Ophthalmic Drugs)  Medication Sig  . ofloxacin (OCUFLOX) 0.3 % ophthalmic solution Place 1 drop into the right eye 4 (four) times daily.  . prednisoLONE acetate (PRED FORTE) 1 % ophthalmic suspension Place 1 drop into the right eye 4 (four) times daily.   No current facility-administered medications for this visit. (Ophthalmic Drugs)   Current Outpatient Medications (Other)  Medication Sig  . allopurinol (ZYLOPRIM) 100 MG tablet Take 100 mg by mouth every morning.   Marland Kitchen allopurinol (ZYLOPRIM) 300 MG tablet Take 300 mg by mouth daily.  Marland Kitchen amLODipine (NORVASC) 10 MG tablet Take 10 mg by mouth every morning.   Marland Kitchen amLODipine (NORVASC) 10 MG tablet Take by mouth.  Marland Kitchen aspirin 81 MG tablet Take 81 mg by mouth every morning.  Marland Kitchen atorvastatin (LIPITOR) 40 MG tablet Take 40 mg by mouth every morning. Reported on 10/01/2015  . atorvastatin (LIPITOR) 40 MG tablet daily.  . carvedilol (COREG) 25 MG tablet Take 25 mg by mouth 2 (two) times daily with a meal.    . cyclobenzaprine (FLEXERIL) 5 MG tablet Take  1 tablet (5 mg total) by mouth 3 (three) times daily as needed.  . doxazosin (CARDURA) 8 MG tablet Take 8 mg by mouth at bedtime.    Marland Kitchen ELIQUIS 5 MG TABS tablet Take 5 mg by mouth 2 (two) times daily.  Marland Kitchen glipiZIDE (GLUCOTROL) 5 MG tablet Take 5 mg by mouth 2 (two) times daily before a meal. Reported on 10/01/2015  . hydrALAZINE (APRESOLINE) 100 MG tablet Bottle  . hydrALAZINE (APRESOLINE) 50 MG tablet Take 50 mg by mouth 2 (two) times daily.  Marland Kitchen HYDROcodone-acetaminophen (NORCO/VICODIN) 5-325 MG per tablet Take 1 tablet by mouth every 6 (six) hours as needed for pain.  . indomethacin (INDOCIN) 50 MG capsule TAKE ONE CAPSULE BY MOUTH TWICE A DAY AS NEEDED FOR PAIN X5 DAYS  . insulin glargine (LANTUS) 100 UNIT/ML injection Inject 10 Units into the skin at bedtime.   . Lancets (ONETOUCH DELICA PLUS [id number]) MISC Apply 1 each topically daily.  Marland Kitchen losartan (COZAAR) 100 MG tablet Take 100 mg by mouth daily.    Marland Kitchen omeprazole (PRILOSEC) 20 MG capsule Take 20 mg by mouth every morning.   Glory Rosebush VERIO test strip USE 1 STRIP VIA METER ONCE A DAY TO CHECK BLOOD SUGAR  . potassium chloride (KLOR-CON) 10 MEQ CR tablet  Take 10 mEq by mouth every morning. Reported on 10/01/2015  . torsemide (DEMADEX) 20 MG tablet Take 20 mg by mouth 2 (two) times daily.  . traMADol (ULTRAM) 50 MG tablet Take 2 tablets (100 mg total) by mouth every 6 (six) hours as needed.  . VELPHORO 500 MG chewable tablet Chew 500 mg by mouth 3 (three) times daily.   No current facility-administered medications for this visit. (Other)      REVIEW OF SYSTEMS: ROS    Positive for: Endocrine   Last edited by Tilda Franco on 12/28/2019  9:11 AM. (History)       ALLERGIES No Known Allergies  PAST MEDICAL HISTORY Past Medical History:  Diagnosis Date  . Anemia   . Anemia in chronic kidney disease 11/20/2015  . Cancer [facility]) 2009   Prostate; radiation seeds  . Chronic kidney disease    stage 4  . Diabetes mellitus   . Gout     . Hypertension    Past Surgical History:  Procedure Laterality Date  . A/V FISTULAGRAM Left 08/17/2017   Procedure: A/V FISTULAGRAM;  Surgeon: Katha Cabal, MD;  Location: [place] [facility];  Service: Cardiovascular;  Laterality: Left;  . A/V SHUNT INTERVENTION N/A 08/17/2017   Procedure: A/V SHUNT INTERVENTION;  Surgeon: Katha Cabal, MD;  Location: [place] [facility];  Service: Cardiovascular;  Laterality: N/A;  . AV FISTULA PLACEMENT Left 05/31/2015   Procedure: ARTERIOVENOUS (AV) FISTULA CREATION;  Surgeon: Katha Cabal, MD;  Location: [facility];  Service: Vascular;  Laterality: Left;  . excision bone spurs Bilateral 1989   feet  . KNEE SURGERY Left 1998   arthroscopy  . SHOULDER SURGERY Left 1994   rotator cuff    FAMILY HISTORY Family History  Problem Relation Age of Onset  . Kidney disease Mother   . Alcoholism Father   . Alcoholism Brother     SOCIAL HISTORY Social History   Tobacco Use  . Smoking status: Former Smoker    Quit date: 10/01/1978    Years since quitting: 41.2  . Smokeless tobacco: Never Used  Substance Use Topics  . Alcohol use: No  . Drug use: No         OPHTHALMIC EXAM:  Base Eye Exam    Visual Acuity (Snellen - Linear)      Right Left   Dist [place] CF @ 4' 20/30       Tonometry (Tonopen, 9:14 AM)      Right Left   Pressure 15 16       Pupils      Dark Light Shape React APD   Right 3 2 Round Slow None   Left 3 2 Irregular Slow None       Neuro/Psych    Oriented x3: Yes   Mood/Affect: Normal       Dilation    Right eye: 1.0% Mydriacyl, 2.5% Phenylephrine @ 9:14 AM        Slit Lamp and Fundus Exam    External Exam      Right Left   External Normal,, lower leg extremity, trace edema Normal,, lower leg extremity, trace edema       Slit Lamp Exam      Right Left   Lids/Lashes Normal Normal   Conjunctiva/Sclera White and quiet White and quiet   Cornea Clear Clear   Anterior Chamber Deep and quiet Deep  and quiet   Iris Round and reactive Round and reactive  Lens  Clear   Anterior Vitreous Normal Normal       Fundus Exam      Right Left   Posterior Vitreous Normal, clear vitrectomized    Disc Normal    C/D Ratio 0.3-0.4    Macula Macular thickening, Epiretinal membrane with severe topographic distortion, Cystoid macular edema    Vessels Quiesced sent proliferative diabetic retinopathy    Periphery Detached, good panretinal photocoagulation           IMAGING AND PROCEDURES  Imaging and Procedures for 12/28/19  OCT, Retina - OU - Both Eyes       Right Eye Quality was good. Scan locations included subfoveal. Central Foveal Thickness: 1146. Progression has worsened. Findings include epiretinal membrane.   Left Eye Quality was good. Scan locations included subfoveal. Central Foveal Thickness: 253. Progression has been stable. Findings include normal observations, normal foveal contour.   Notes OD   severe macular topographic distortion and elevation of the fovea secondary to dense fibrotic epiretinal membrane.                ASSESSMENT/PLAN:  Macular pucker, right eye This condition progressing with retinal thickening in the macula and severe topographic distortion.  We will need to proceed to vitrectomy with membrane peel right eye.  This would be an attempt to see to improve visual acuity and functioning      ICD-10-CM   1. Macular pucker, right eye  H35.371   2. 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  Risk benefits surgical intervention for the right eye reviewed.  Visual acuity will not have a chance to improve without surgical intervention to remove the severe fibrotic epiretinal membrane.  2.  3.  Ophthalmic Meds Ordered this visit:  No orders of the defined types were placed in this encounter.      Return ,, Within 1 to 2  weeks, for Schedule vitrectomy, membrane peel OD, [facility].  There are no Patient Instructions on file for this visit.   Explained the diagnoses, plan, and follow up with the patient and they expressed understanding.  Patient expressed understanding of the importance of proper follow up care.   Clent Demark Olevia Westervelt M.D. Diseases & Surgery of the Retina and Vitreous Retina & Diabetic [place] 12/28/19     Abbreviations: M myopia (nearsighted); A astigmatism; H hyperopia (farsighted); P presbyopia; Mrx spectacle prescription;  CTL contact lenses; OD right eye; OS left eye; OU both eyes  XT exotropia; ET esotropia; PEK punctate epithelial keratitis; PEE punctate epithelial erosions; DES dry eye syndrome; MGD meibomian gland dysfunction; ATs artificial tears; PFAT's preservative free artificial tears; [place] nuclear sclerotic cataract; PSC posterior subcapsular cataract; ERM epi-retinal membrane; PVD posterior vitreous detachment; RD retinal detachment; DM diabetes mellitus; DR diabetic retinopathy; NPDR non-proliferative diabetic retinopathy; PDR proliferative diabetic retinopathy; CSME clinically significant macular edema; DME diabetic macular edema; dbh dot blot hemorrhages; CWS cotton wool spot; POAG primary open angle glaucoma; C/D cup-to-disc ratio; HVF humphrey visual field; GVF goldmann visual field; OCT optical coherence tomography; IOP intraocular pressure; BRVO Branch retinal vein occlusion; CRVO central retinal vein occlusion; CRAO central retinal artery occlusion; BRAO branch retinal artery occlusion; RT retinal tear; SB scleral buckle; PPV pars plana vitrectomy; VH Vitreous hemorrhage; PRP panretinal laser photocoagulation; IVK intravitreal kenalog; VMT vitreomacular traction; MH Macular hole;  NVD neovascularization of the disc;  NVE neovascularization elsewhere; AREDS age related eye disease study; ARMD age related macular degeneration; POAG primary open angle glaucoma; EBMD  epithelial/anterior basement membrane dystrophy; ACIOL anterior chamber intraocular lens; IOL intraocular lens; PCIOL posterior chamber intraocular lens; Phaco/IOL phacoemulsification with intraocular lens placement; [place] photorefractive keratectomy; LASIK laser assisted in situ keratomileusis; HTN hypertension; DM diabetes mellitus; COPD chronic obstructive pulmonary disease

## 2020-01-03 ENCOUNTER — Ambulatory Visit (INDEPENDENT_AMBULATORY_CARE_PROVIDER_SITE_OTHER): Payer: Medicare Other

## 2020-01-03 ENCOUNTER — Other Ambulatory Visit: Payer: Self-pay

## 2020-01-03 DIAGNOSIS — H35371 Puckering of macula, right eye: Secondary | ICD-10-CM

## 2020-01-03 MED ORDER — PREDNISOLONE ACETATE 1 % OP SUSP: 1.0000 [drp] | Freq: Four times a day (QID) | OPHTHALMIC | 0 refills | Status: DC

## 2020-01-03 MED ORDER — OFLOXACIN 0.3 % OP SOLN: 1.0000 [drp] | Freq: Four times a day (QID) | OPHTHALMIC | 0 refills | Status: DC

## 2020-01-03 NOTE — Progress Notes (Signed)
01/03/2020     CHIEF COMPLAINT Patient presents for Pre-op Exam   HISTORY OF PRESENT ILLNESS: Justin [name] is a 74 y.o. male who presents to the clinic today for:   HPI    Pre Op visit  Vitrectomy and Membrane Peel OD   Last edited by Tilda Franco on 01/03/2020  1:31 PM. (History)        HISTORICAL INFORMATION:   Selected notes from the MEDICAL RECORD NUMBER    Lab Results  Component Value Date   HGBA1C 5.9 (H) 07/11/2011     CURRENT MEDICATIONS: Current Outpatient Medications (Ophthalmic Drugs)  Medication Sig  . ofloxacin (OCUFLOX) 0.3 % ophthalmic solution Place 1 drop into the right eye 4 (four) times daily.  Marland Kitchen ofloxacin (OCUFLOX) 0.3 % ophthalmic solution Place 1 drop into the right eye 4 (four) times daily.  . prednisoLONE acetate (PRED FORTE) 1 % ophthalmic suspension Place 1 drop into the right eye 4 (four) times daily.  . prednisoLONE acetate (PRED FORTE) 1 % ophthalmic suspension Place 1 drop into the right eye 4 (four) times daily.   No current facility-administered medications for this visit. (Ophthalmic Drugs)   Current Outpatient Medications (Other)  Medication Sig  . allopurinol (ZYLOPRIM) 100 MG tablet Take 100 mg by mouth every morning.   Marland Kitchen allopurinol (ZYLOPRIM) 300 MG tablet Take 300 mg by mouth daily.  Marland Kitchen amLODipine (NORVASC) 10 MG tablet Take 10 mg by mouth every morning.   Marland Kitchen amLODipine (NORVASC) 10 MG tablet Take by mouth.  Marland Kitchen aspirin 81 MG tablet Take 81 mg by mouth every morning.  Marland Kitchen atorvastatin (LIPITOR) 40 MG tablet Take 40 mg by mouth every morning. Reported on 10/01/2015  . atorvastatin (LIPITOR) 40 MG tablet daily.  . carvedilol (COREG) 25 MG tablet Take 25 mg by mouth 2 (two) times daily with a meal.    . cyclobenzaprine (FLEXERIL) 5 MG tablet Take 1 tablet (5 mg total) by mouth 3 (three) times daily as needed.  . doxazosin (CARDURA) 8 MG tablet Take 8 mg by mouth at bedtime.    Marland Kitchen ELIQUIS 5 MG TABS tablet Take 5 mg by mouth 2  (two) times daily.  Marland Kitchen glipiZIDE (GLUCOTROL) 5 MG tablet Take 5 mg by mouth 2 (two) times daily before a meal. Reported on 10/01/2015  . hydrALAZINE (APRESOLINE) 100 MG tablet Bottle  . hydrALAZINE (APRESOLINE) 50 MG tablet Take 50 mg by mouth 2 (two) times daily.  Marland Kitchen HYDROcodone-acetaminophen (NORCO/VICODIN) 5-325 MG per tablet Take 1 tablet by mouth every 6 (six) hours as needed for pain.  . indomethacin (INDOCIN) 50 MG capsule TAKE ONE CAPSULE BY MOUTH TWICE A DAY AS NEEDED FOR PAIN X5 DAYS  . insulin glargine (LANTUS) 100 UNIT/ML injection Inject 10 Units into the skin at bedtime.   . Lancets (ONETOUCH DELICA PLUS [id number]) MISC Apply 1 each topically daily.  Marland Kitchen losartan (COZAAR) 100 MG tablet Take 100 mg by mouth daily.    Marland Kitchen omeprazole (PRILOSEC) 20 MG capsule Take 20 mg by mouth every morning.   Glory Rosebush VERIO test strip USE 1 STRIP VIA METER ONCE A DAY TO CHECK BLOOD SUGAR  . potassium chloride (KLOR-CON) 10 MEQ CR tablet Take 10 mEq by mouth every morning. Reported on 10/01/2015  . torsemide (DEMADEX) 20 MG tablet Take 20 mg by mouth 2 (two) times daily.  . traMADol (ULTRAM) 50 MG tablet Take 2 tablets (100 mg total) by mouth every 6 (six) hours as needed.  Marland Kitchen  VELPHORO 500 MG chewable tablet Chew 500 mg by mouth 3 (three) times daily.   No current facility-administered medications for this visit. (Other)     ALLERGIES No Known Allergies  PAST MEDICAL HISTORY Past Medical History:  Diagnosis Date  . Anemia   . Anemia in chronic kidney disease 11/20/2015  . 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 Chronic kidney disease    stage 4  . Diabetes mellitus   . Gout   . Hypertension    Past Surgical History:  Procedure Laterality Date  . A/V FISTULAGRAM Left 08/17/2017   Procedure: A/V FISTULAGRAM;  Surgeon: Katha Cabal, MD;  Location: [place] [facility];  Service: Cardiovascular;  Laterality: Left;  . A/V SHUNT INTERVENTION N/A 08/17/2017   Procedure: A/V SHUNT  INTERVENTION;  Surgeon: Katha Cabal, MD;  Location: [place] [facility];  Service: Cardiovascular;  Laterality: N/A;  . AV FISTULA PLACEMENT Left 05/31/2015   Procedure: ARTERIOVENOUS (AV) FISTULA CREATION;  Surgeon: Katha Cabal, MD;  Location: [facility];  Service: Vascular;  Laterality: Left;  . excision bone spurs Bilateral 1989   feet  . KNEE SURGERY Left 1998   arthroscopy  . SHOULDER SURGERY Left 1994   rotator cuff    FAMILY HISTORY Family History  Problem Relation Age of Onset  . Kidney disease Mother   . Alcoholism Father   . Alcoholism Brother     SOCIAL HISTORY Social History   Tobacco Use  . Smoking status: Former Smoker    Quit date: 10/01/1978    Years since quitting: 41.2  . Smokeless tobacco: Never Used  Substance Use Topics  . Alcohol use: No  . Drug use: No         OPHTHALMIC EXAM:  Base Eye Exam    Visual Acuity (Snellen - Linear)      Right Left   Dist [place] E Card @ 4' 20/30   Dist ph  NI        Tonometry (Tonopen, 1:34 PM)      Right Left   Pressure 15 14       Neuro/Psych    Oriented x3: Yes   Mood/Affect: Normal          IMAGING AND PROCEDURES  Imaging and Procedures for @TODAY @           ASSESSMENT/PLAN:  No diagnosis found.  Ophthalmic Meds Ordered this visit:  Meds ordered this encounter  Medications  . prednisoLONE acetate (PRED FORTE) 1 % ophthalmic suspension    Sig: Place 1 drop into the right eye 4 (four) times daily.    Dispense:  10 mL    Refill:  0  . ofloxacin (OCUFLOX) 0.3 % ophthalmic solution    Sig: Place 1 drop into the right eye 4 (four) times daily.    Dispense:  5 mL    Refill:  0        Pre-op completed. Operative consent obtained with pre-op eye drops reviewed with Vinetta Bergamo and sent via [facility] as needed. Post op instructions reviewed with patient and per patient all questions answered.  Tilda Franco

## 2020-01-11 ENCOUNTER — Encounter (INDEPENDENT_AMBULATORY_CARE_PROVIDER_SITE_OTHER): Payer: Medicare Other | Admitting: Ophthalmology

## 2020-01-17 ENCOUNTER — Encounter (INDEPENDENT_AMBULATORY_CARE_PROVIDER_SITE_OTHER): Payer: Medicare Other | Admitting: Ophthalmology

## 2020-01-17 DIAGNOSIS — H35371 Puckering of macula, right eye: Secondary | ICD-10-CM | POA: Diagnosis not present

## 2020-01-18 ENCOUNTER — Other Ambulatory Visit: Payer: Self-pay

## 2020-01-18 ENCOUNTER — Encounter (INDEPENDENT_AMBULATORY_CARE_PROVIDER_SITE_OTHER): Payer: Self-pay | Admitting: Ophthalmology

## 2020-01-18 ENCOUNTER — Ambulatory Visit (INDEPENDENT_AMBULATORY_CARE_PROVIDER_SITE_OTHER): Payer: Medicare Other | Admitting: Ophthalmology

## 2020-01-18 DIAGNOSIS — Z09 Encounter for follow-up examination after completed treatment for conditions other than malignant neoplasm: Secondary | ICD-10-CM

## 2020-01-18 DIAGNOSIS — H35371 Puckering of macula, right eye: Secondary | ICD-10-CM

## 2020-01-18 NOTE — Progress Notes (Signed)
01/18/2020     CHIEF COMPLAINT Patient presents for Post-op Follow-up   HISTORY OF PRESENT ILLNESS: Justin [name] is a 74 y.o. male who presents to the clinic today for:   HPI    Post-op Follow-up    In right eye.  Discomfort includes Negative for pain and itching.  Vision is stable.          Comments    1 day PO OD - Vitrectomy & Membrane peel, gas injection, for severe preretinal mostly fibrotic epiretinal membrane Patient states that he did not sleep well last night. Patient denies any problems.       Last edited by Hurman Horn, MD on 01/18/2020  9:20 AM. (History)      Referring physician: [address], [place] [address]  HISTORICAL INFORMATION:   Selected notes from the MEDICAL RECORD NUMBER    Lab Results  Component Value Date   HGBA1C 5.9 (H) 07/11/2011     CURRENT MEDICATIONS: Current Outpatient Medications (Ophthalmic Drugs)  Medication Sig  . ofloxacin (OCUFLOX) 0.3 % ophthalmic solution Place 1 drop into the right eye 4 (four) times daily.  Marland Kitchen ofloxacin (OCUFLOX) 0.3 % ophthalmic solution Place 1 drop into the right eye 4 (four) times daily.  . prednisoLONE acetate (PRED FORTE) 1 % ophthalmic suspension Place 1 drop into the right eye 4 (four) times daily.  . prednisoLONE acetate (PRED FORTE) 1 % ophthalmic suspension Place 1 drop into the right eye 4 (four) times daily.   No current facility-administered medications for this visit. (Ophthalmic Drugs)   Current Outpatient Medications (Other)  Medication Sig  . allopurinol (ZYLOPRIM) 100 MG tablet Take 100 mg by mouth every morning.   Marland Kitchen allopurinol (ZYLOPRIM) 300 MG tablet Take 300 mg by mouth daily.  Marland Kitchen amLODipine (NORVASC) 10 MG tablet Take 10 mg by mouth every morning.   Marland Kitchen amLODipine (NORVASC) 10 MG tablet Take by mouth.  Marland Kitchen aspirin 81 MG tablet Take 81 mg by mouth every morning.  Marland Kitchen atorvastatin (LIPITOR) 40 MG tablet Take 40 mg by mouth every morning.  Reported on 10/01/2015  . atorvastatin (LIPITOR) 40 MG tablet daily.  . carvedilol (COREG) 25 MG tablet Take 25 mg by mouth 2 (two) times daily with a meal.    . cyclobenzaprine (FLEXERIL) 5 MG tablet Take 1 tablet (5 mg total) by mouth 3 (three) times daily as needed.  . doxazosin (CARDURA) 8 MG tablet Take 8 mg by mouth at bedtime.    Marland Kitchen ELIQUIS 5 MG TABS tablet Take 5 mg by mouth 2 (two) times daily.  Marland Kitchen glipiZIDE (GLUCOTROL) 5 MG tablet Take 5 mg by mouth 2 (two) times daily before a meal. Reported on 10/01/2015  . hydrALAZINE (APRESOLINE) 100 MG tablet Bottle  . hydrALAZINE (APRESOLINE) 50 MG tablet Take 50 mg by mouth 2 (two) times daily.  Marland Kitchen HYDROcodone-acetaminophen (NORCO/VICODIN) 5-325 MG per tablet Take 1 tablet by mouth every 6 (six) hours as needed for pain.  . indomethacin (INDOCIN) 50 MG capsule TAKE ONE CAPSULE BY MOUTH TWICE A DAY AS NEEDED FOR PAIN X5 DAYS  . insulin glargine (LANTUS) 100 UNIT/ML injection Inject 10 Units into the skin at bedtime.   . Lancets (ONETOUCH DELICA PLUS [id number]) MISC Apply 1 each topically daily.  Marland Kitchen losartan (COZAAR) 100 MG tablet Take 100 mg by mouth daily.    Marland Kitchen omeprazole (PRILOSEC) 20 MG capsule Take 20 mg by mouth every morning.   Marland Kitchen  ONETOUCH VERIO test strip USE 1 STRIP VIA METER ONCE A DAY TO CHECK BLOOD SUGAR  . potassium chloride (KLOR-CON) 10 MEQ CR tablet Take 10 mEq by mouth every morning. Reported on 10/01/2015  . torsemide (DEMADEX) 20 MG tablet Take 20 mg by mouth 2 (two) times daily.  . traMADol (ULTRAM) 50 MG tablet Take 2 tablets (100 mg total) by mouth every 6 (six) hours as needed.  . VELPHORO 500 MG chewable tablet Chew 500 mg by mouth 3 (three) times daily.   No current facility-administered medications for this visit. (Other)      REVIEW OF SYSTEMS:    ALLERGIES No Known Allergies  PAST MEDICAL HISTORY Past Medical History:  Diagnosis Date  . Anemia   . Anemia in chronic kidney disease 11/20/2015  . Cancer [facility]) 2009     Prostate; radiation seeds  . Chronic kidney disease    stage 4  . Diabetes mellitus   . Gout   . Hypertension    Past Surgical History:  Procedure Laterality Date  . A/V FISTULAGRAM Left 08/17/2017   Procedure: A/V FISTULAGRAM;  Surgeon: Katha Cabal, MD;  Location: [place] [facility];  Service: Cardiovascular;  Laterality: Left;  . A/V SHUNT INTERVENTION N/A 08/17/2017   Procedure: A/V SHUNT INTERVENTION;  Surgeon: Katha Cabal, MD;  Location: [place] [facility];  Service: Cardiovascular;  Laterality: N/A;  . AV FISTULA PLACEMENT Left 05/31/2015   Procedure: ARTERIOVENOUS (AV) FISTULA CREATION;  Surgeon: Katha Cabal, MD;  Location: [facility];  Service: Vascular;  Laterality: Left;  . excision bone spurs Bilateral 1989   feet  . KNEE SURGERY Left 1998   arthroscopy  . SHOULDER SURGERY Left 1994   rotator cuff    FAMILY HISTORY Family History  Problem Relation Age of Onset  . Kidney disease Mother   . Alcoholism Father   . Alcoholism Brother     SOCIAL HISTORY Social History   Tobacco Use  . Smoking status: Former Smoker    Quit date: 10/01/1978    Years since quitting: 41.3  . Smokeless tobacco: Never Used  Substance Use Topics  . Alcohol use: No  . Drug use: No         OPHTHALMIC EXAM:  Base Eye Exam    Visual Acuity (Snellen - Linear)      Right Left   Dist [place] HM 20/25-1       Tonometry (Tonopen, 8:34 AM)      Right Left   Pressure 9 14       Neuro/Psych    Oriented x3: Yes   Mood/Affect: Normal       Dilation    Both eyes: 1.0% Mydriacyl, 2.5% Phenylephrine @ 8:34 AM        Slit Lamp and Fundus Exam    External Exam      Right Left   External Normal Normal       Slit Lamp Exam      Right Left   Lids/Lashes Normal Normal   Conjunctiva/Sclera White and quiet White and quiet   Cornea Clear Clear   Anterior Chamber Deep and quiet Deep and quiet   Iris Round and reactive Round and reactive   Lens Centered posterior  chamber intraocular lens    Anterior Vitreous Gas, clear Normal       Fundus Exam      Right Left   Posterior Vitreous Vitrectomized, clear, gas    Disc Normal  C/D Ratio 0.35    Macula Much less topographic distortion    Vessels PDR-quiet    Periphery Good PRP peripherally           IMAGING AND PROCEDURES  Imaging and Procedures for 01/18/20           ASSESSMENT/PLAN:  No problem-specific Assessment & Plan notes found for this encounter.    No diagnosis found.  1.  OD, postoperative day #1 status post vitrectomy, membrane peel with gas injection of the very severe dense preretinal epiretinal membrane.  2.  Retina attached, macula is certainly with much less topographic distortion day 1  3.  Patient is to restart topical ofloxacin 1 drop right eye 4 times daily  Patient to start prednisolone acetate 1 drop right eye 4 times daily  4. Positioning reviewed with patient. Do not sleep flat on back.  Ophthalmic Meds Ordered this visit:  No orders of the defined types were placed in this encounter.      Return in about 1 week (around 01/25/2020) for POST OP, OD, dilate, OCT.  There are no Patient Instructions on file for this visit.   Explained the diagnoses, plan, and follow up with the patient and they expressed understanding.  Patient expressed understanding of the importance of proper follow up care.   Clent Demark Satara Virella M.D. Diseases & Surgery of the Retina and Vitreous Retina & Diabetic [place] 01/18/20     Abbreviations: M myopia (nearsighted); A astigmatism; H hyperopia (farsighted); P presbyopia; Mrx spectacle prescription;  CTL contact lenses; OD right eye; OS left eye; OU both eyes  XT exotropia; ET esotropia; PEK punctate epithelial keratitis; PEE punctate epithelial erosions; DES dry eye syndrome; MGD meibomian gland dysfunction; ATs artificial tears; PFAT's preservative free artificial tears; [place] nuclear sclerotic cataract; PSC posterior  subcapsular cataract; ERM epi-retinal membrane; PVD posterior vitreous detachment; RD retinal detachment; DM diabetes mellitus; DR diabetic retinopathy; NPDR non-proliferative diabetic retinopathy; PDR proliferative diabetic retinopathy; CSME clinically significant macular edema; DME diabetic macular edema; dbh dot blot hemorrhages; CWS cotton wool spot; POAG primary open angle glaucoma; C/D cup-to-disc ratio; HVF humphrey visual field; GVF goldmann visual field; OCT optical coherence tomography; IOP intraocular pressure; BRVO Branch retinal vein occlusion; CRVO central retinal vein occlusion; CRAO central retinal artery occlusion; BRAO branch retinal artery occlusion; RT retinal tear; SB scleral buckle; PPV pars plana vitrectomy; VH Vitreous hemorrhage; PRP panretinal laser photocoagulation; IVK intravitreal kenalog; VMT vitreomacular traction; MH Macular hole;  NVD neovascularization of the disc; NVE neovascularization elsewhere; AREDS age related eye disease study; ARMD age related macular degeneration; POAG primary open angle glaucoma; EBMD epithelial/anterior basement membrane dystrophy; ACIOL anterior chamber intraocular lens; IOL intraocular lens; PCIOL posterior chamber intraocular lens; Phaco/IOL phacoemulsification with intraocular lens placement; [name] photorefractive keratectomy; LASIK laser assisted in situ keratomileusis; HTN hypertension; DM diabetes mellitus; COPD chronic obstructive pulmonary disease

## 2020-01-25 ENCOUNTER — Encounter (INDEPENDENT_AMBULATORY_CARE_PROVIDER_SITE_OTHER): Payer: Self-pay | Admitting: Ophthalmology

## 2020-01-25 ENCOUNTER — Other Ambulatory Visit: Payer: Self-pay

## 2020-01-25 ENCOUNTER — Ambulatory Visit (INDEPENDENT_AMBULATORY_CARE_PROVIDER_SITE_OTHER): Payer: Medicare Other | Admitting: Ophthalmology

## 2020-01-25 DIAGNOSIS — Z09 Encounter for follow-up examination after completed treatment for conditions other than malignant neoplasm: Secondary | ICD-10-CM

## 2020-01-25 DIAGNOSIS — H35371 Puckering of macula, right eye: Secondary | ICD-10-CM | POA: Diagnosis not present

## 2020-01-25 NOTE — Patient Instructions (Signed)
The patient was found to be doing well, postoperatively, and was advised in the use of drops and home care.  Patient was advised not to rub eyes. Positioning was described as well, as precautions regarding intravitreal gas, if applicable. DO NOT TRAVEL TO [place], OR IN AIRPLANE, UNTIL THE BUBBLE INSIDE THE EYE HAS DISAPPEARED.  The use of eye patch at night is optional and was discussed. May use paper tape with patch if patient has dry skin and transpore tape for oily skin.   Use topical medications as ordered.

## 2020-01-25 NOTE — Progress Notes (Signed)
01/25/2020     CHIEF COMPLAINT Patient presents for Post-op Follow-up   HISTORY OF PRESENT ILLNESS: Justin [name] is a 74 y.o. male who presents to the clinic today for:   HPI    Post-op Follow-up    In right eye.  Discomfort includes Negative for pain and itching.  Vision is stable.          Comments    1 week PO OD- Vitrectomy & Membrane peel, gas injection, for severe preretinal mostly fibrotic epiretinal membrane Patient states his eye has been stable. No problems.       Last edited by Gerda Diss on 01/25/2020  8:19 AM. (History)      Referring physician: Kendrick Ranch, [place] [address]  HISTORICAL INFORMATION:   Selected notes from the MEDICAL RECORD NUMBER    Lab Results  Component Value Date   HGBA1C 5.9 (H) 07/11/2011     CURRENT MEDICATIONS: Current Outpatient Medications (Ophthalmic Drugs)  Medication Sig  . ofloxacin (OCUFLOX) 0.3 % ophthalmic solution Place 1 drop into the right eye 4 (four) times daily.  Marland Kitchen ofloxacin (OCUFLOX) 0.3 % ophthalmic solution Place 1 drop into the right eye 4 (four) times daily.  . prednisoLONE acetate (PRED FORTE) 1 % ophthalmic suspension Place 1 drop into the right eye 4 (four) times daily.  . prednisoLONE acetate (PRED FORTE) 1 % ophthalmic suspension Place 1 drop into the right eye 4 (four) times daily.   No current facility-administered medications for this visit. (Ophthalmic Drugs)   Current Outpatient Medications (Other)  Medication Sig  . allopurinol (ZYLOPRIM) 100 MG tablet Take 100 mg by mouth every morning.   Marland Kitchen allopurinol (ZYLOPRIM) 300 MG tablet Take 300 mg by mouth daily.  Marland Kitchen amLODipine (NORVASC) 10 MG tablet Take 10 mg by mouth every morning.   Marland Kitchen amLODipine (NORVASC) 10 MG tablet Take by mouth.  Marland Kitchen aspirin 81 MG tablet Take 81 mg by mouth every morning.  Marland Kitchen atorvastatin (LIPITOR) 40 MG tablet Take 40 mg by mouth every morning. Reported on 10/01/2015  . atorvastatin  (LIPITOR) 40 MG tablet daily.  . carvedilol (COREG) 25 MG tablet Take 25 mg by mouth 2 (two) times daily with a meal.    . cyclobenzaprine (FLEXERIL) 5 MG tablet Take 1 tablet (5 mg total) by mouth 3 (three) times daily as needed.  . doxazosin (CARDURA) 8 MG tablet Take 8 mg by mouth at bedtime.    Marland Kitchen ELIQUIS 5 MG TABS tablet Take 5 mg by mouth 2 (two) times daily.  Marland Kitchen glipiZIDE (GLUCOTROL) 5 MG tablet Take 5 mg by mouth 2 (two) times daily before a meal. Reported on 10/01/2015  . hydrALAZINE (APRESOLINE) 100 MG tablet Bottle  . hydrALAZINE (APRESOLINE) 50 MG tablet Take 50 mg by mouth 2 (two) times daily.  Marland Kitchen HYDROcodone-acetaminophen (NORCO/VICODIN) 5-325 MG per tablet Take 1 tablet by mouth every 6 (six) hours as needed for pain.  . indomethacin (INDOCIN) 50 MG capsule TAKE ONE CAPSULE BY MOUTH TWICE A DAY AS NEEDED FOR PAIN X5 DAYS  . insulin glargine (LANTUS) 100 UNIT/ML injection Inject 10 Units into the skin at bedtime.   . Lancets (ONETOUCH DELICA PLUS [id number]) MISC Apply 1 each topically daily.  Marland Kitchen losartan (COZAAR) 100 MG tablet Take 100 mg by mouth daily.    Marland Kitchen omeprazole (PRILOSEC) 20 MG capsule Take 20 mg by mouth every morning.   Glory Rosebush VERIO test strip USE 1 STRIP VIA  METER ONCE A DAY TO CHECK BLOOD SUGAR  . potassium chloride (KLOR-CON) 10 MEQ CR tablet Take 10 mEq by mouth every morning. Reported on 10/01/2015  . torsemide (DEMADEX) 20 MG tablet Take 20 mg by mouth 2 (two) times daily.  . traMADol (ULTRAM) 50 MG tablet Take 2 tablets (100 mg total) by mouth every 6 (six) hours as needed.  . VELPHORO 500 MG chewable tablet Chew 500 mg by mouth 3 (three) times daily.   No current facility-administered medications for this visit. (Other)      REVIEW OF SYSTEMS:    ALLERGIES No Known Allergies  PAST MEDICAL HISTORY Past Medical History:  Diagnosis Date  . Anemia   . Anemia in chronic kidney disease 11/20/2015  . 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  Chronic kidney disease    stage 4  . Diabetes mellitus   . Gout   . Hypertension    Past Surgical History:  Procedure Laterality Date  . A/V FISTULAGRAM Left 08/17/2017   Procedure: A/V FISTULAGRAM;  Surgeon: Katha Cabal, MD;  Location: [place] [facility];  Service: Cardiovascular;  Laterality: Left;  . A/V SHUNT INTERVENTION N/A 08/17/2017   Procedure: A/V SHUNT INTERVENTION;  Surgeon: Katha Cabal, MD;  Location: [place] [facility];  Service: Cardiovascular;  Laterality: N/A;  . AV FISTULA PLACEMENT Left 05/31/2015   Procedure: ARTERIOVENOUS (AV) FISTULA CREATION;  Surgeon: Katha Cabal, MD;  Location: [facility];  Service: Vascular;  Laterality: Left;  . excision bone spurs Bilateral 1989   feet  . KNEE SURGERY Left 1998   arthroscopy  . SHOULDER SURGERY Left 1994   rotator cuff    FAMILY HISTORY Family History  Problem Relation Age of Onset  . Kidney disease Mother   . Alcoholism Father   . Alcoholism Brother     SOCIAL HISTORY Social History   Tobacco Use  . Smoking status: Former Smoker    Quit date: 10/01/1978    Years since quitting: 41.3  . Smokeless tobacco: Never Used  Substance Use Topics  . Alcohol use: No  . Drug use: No         OPHTHALMIC EXAM:  Base Eye Exam    Visual Acuity (Snellen - Linear)      Right Left   Dist [place] CF @ 2' 20/25-1   Dist ph [place] NI        Tonometry (Tonopen, 8:22 AM)      Right Left   Pressure 7 12       Neuro/Psych    Oriented x3: Yes   Mood/Affect: Normal       Dilation    Right eye: 1.0% Mydriacyl, 2.5% Phenylephrine @ 8:22 AM        Slit Lamp and Fundus Exam    External Exam      Right Left   External Normal Normal       Slit Lamp Exam      Right Left   Lids/Lashes Normal Normal   Conjunctiva/Sclera White and quiet White and quiet   Cornea Clear Clear   Anterior Chamber Deep and quiet Deep and quiet   Iris Round and reactive Round and reactive   Lens Posterior chamber intraocular  lens    Anterior Vitreous Normal Normal       Fundus Exam      Right Left   Posterior Vitreous Vitrectomized, clear, gas 30%    Disc Normal    C/D  Ratio 0.35    Macula Much less topographic distortion    Vessels PDR-quiet    Periphery Good PRP peripherally           IMAGING AND PROCEDURES  Imaging and Procedures for 01/25/20  OCT, Retina - OU - Both Eyes       Right Eye Quality was good. Scan locations included subfoveal. Central Foveal Thickness: 554. Progression has improved. Findings include abnormal foveal contour.   Left Eye Quality was good. Scan locations included subfoveal.   Notes OD, much less retinal thickening status post vitrectomy, membrane peel                ASSESSMENT/PLAN:  Macular pucker, right eye Macular topographic distortion improving status post vitrectomy membrane peel OD      ICD-10-CM   1. Follow-up examination after eye surgery  Z09 OCT, Retina - OU - Both Eyes  2. Macular pucker, right eye  H35.371 OCT, Retina - OU - Both Eyes    1.  Patient is to continue topical medications for the next 2 weeks.  2.  Patient continues positioning for another 7 days.  3.  Patient is free to resume all activity and in 1 week  Ophthalmic Meds Ordered this visit:  No orders of the defined types were placed in this encounter.      Return in about 6 weeks (around 03/07/2020) for dilate, OD, OCT.  Patient Instructions  The patient was found to be doing well, postoperatively, and was advised in the use of drops and home care.  Patient was advised not to rub eyes. Positioning was described as well, as precautions regarding intravitreal gas, if applicable. DO NOT TRAVEL TO [place], OR IN AIRPLANE, UNTIL THE BUBBLE INSIDE THE EYE HAS DISAPPEARED.  The use of eye patch at night is optional and was discussed. May use paper tape with patch if patient has dry skin and transpore tape for oily skin.   Use topical medications as  ordered.    Explained the diagnoses, plan, and follow up with the patient and they expressed understanding.  Patient expressed understanding of the importance of proper follow up care.   Clent Demark John Williamsen M.D. Diseases & Surgery of the Retina and Vitreous Retina & Diabetic [place] 01/25/20     Abbreviations: M myopia (nearsighted); A astigmatism; H hyperopia (farsighted); P presbyopia; Mrx spectacle prescription;  CTL contact lenses; OD right eye; OS left eye; OU both eyes  XT exotropia; ET esotropia; PEK punctate epithelial keratitis; PEE punctate epithelial erosions; DES dry eye syndrome; MGD meibomian gland dysfunction; ATs artificial tears; PFAT's preservative free artificial tears; [place] nuclear sclerotic cataract; PSC posterior subcapsular cataract; ERM epi-retinal membrane; PVD posterior vitreous detachment; RD retinal detachment; DM diabetes mellitus; DR diabetic retinopathy; NPDR non-proliferative diabetic retinopathy; PDR proliferative diabetic retinopathy; CSME clinically significant macular edema; DME diabetic macular edema; dbh dot blot hemorrhages; CWS cotton wool spot; POAG primary open angle glaucoma; C/D cup-to-disc ratio; HVF humphrey visual field; GVF goldmann visual field; OCT optical coherence tomography; IOP intraocular pressure; BRVO Branch retinal vein occlusion; CRVO central retinal vein occlusion; CRAO central retinal artery occlusion; BRAO branch retinal artery occlusion; RT retinal tear; SB scleral buckle; PPV pars plana vitrectomy; VH Vitreous hemorrhage; PRP panretinal laser photocoagulation; IVK intravitreal kenalog; VMT vitreomacular traction; MH Macular hole;  NVD neovascularization of the disc; NVE neovascularization elsewhere; AREDS age related eye disease study; ARMD age related macular degeneration; POAG primary open angle glaucoma; EBMD epithelial/anterior basement membrane dystrophy; ACIOL anterior  chamber intraocular lens; IOL intraocular lens; PCIOL posterior  chamber intraocular lens; Phaco/IOL phacoemulsification with intraocular lens placement; Eugenio [place] photorefractive keratectomy; LASIK laser assisted in situ keratomileusis; HTN hypertension; DM diabetes mellitus; COPD chronic obstructive pulmonary disease

## 2020-01-25 NOTE — Assessment & Plan Note (Signed)
Macular topographic distortion improving status post vitrectomy membrane peel OD

## 2020-01-28 ENCOUNTER — Inpatient Hospital Stay (HOSPITAL_COMMUNITY)
Admission: EM | Admit: 2020-01-28 | Discharge: 2020-02-01 | DRG: 579 | Disposition: A | Discharge status: Home / Self Care | Payer: Medicare Other | Attending: Family Medicine | Admitting: Family Medicine

## 2020-01-28 ENCOUNTER — Encounter (HOSPITAL_COMMUNITY): Payer: Self-pay

## 2020-01-28 ENCOUNTER — Emergency Department (HOSPITAL_COMMUNITY): Payer: Medicare Other

## 2020-01-28 ENCOUNTER — Other Ambulatory Visit: Payer: Self-pay

## 2020-01-28 DIAGNOSIS — E1122 Type 2 diabetes mellitus with diabetic chronic kidney disease: Secondary | ICD-10-CM | POA: Diagnosis present

## 2020-01-28 DIAGNOSIS — L02818 Cutaneous abscess of other sites: Secondary | ICD-10-CM | POA: Diagnosis not present

## 2020-01-28 DIAGNOSIS — I480 Paroxysmal atrial fibrillation: Secondary | ICD-10-CM | POA: Diagnosis not present

## 2020-01-28 DIAGNOSIS — E119 Type 2 diabetes mellitus without complications: Secondary | ICD-10-CM

## 2020-01-28 DIAGNOSIS — I350 Nonrheumatic aortic (valve) stenosis: Secondary | ICD-10-CM | POA: Diagnosis present

## 2020-01-28 DIAGNOSIS — K449 Diaphragmatic hernia without obstruction or gangrene: Secondary | ICD-10-CM | POA: Diagnosis present

## 2020-01-28 DIAGNOSIS — Z841 Family history of disorders of kidney and ureter: Secondary | ICD-10-CM

## 2020-01-28 DIAGNOSIS — D631 Anemia in chronic kidney disease: Secondary | ICD-10-CM | POA: Diagnosis present

## 2020-01-28 DIAGNOSIS — L089 Local infection of the skin and subcutaneous tissue, unspecified: Secondary | ICD-10-CM | POA: Diagnosis not present

## 2020-01-28 DIAGNOSIS — N400 Enlarged prostate without lower urinary tract symptoms: Secondary | ICD-10-CM | POA: Diagnosis present

## 2020-01-28 DIAGNOSIS — N320 Bladder-neck obstruction: Secondary | ICD-10-CM | POA: Diagnosis present

## 2020-01-28 DIAGNOSIS — Z7901 Long term (current) use of anticoagulants: Secondary | ICD-10-CM

## 2020-01-28 DIAGNOSIS — N186 End stage renal disease: Secondary | ICD-10-CM | POA: Diagnosis present

## 2020-01-28 DIAGNOSIS — H3521 Other non-diabetic proliferative retinopathy, right eye: Secondary | ICD-10-CM | POA: Diagnosis present

## 2020-01-28 DIAGNOSIS — Z87891 Personal history of nicotine dependence: Secondary | ICD-10-CM

## 2020-01-28 DIAGNOSIS — I4891 Unspecified atrial fibrillation: Secondary | ICD-10-CM | POA: Diagnosis present

## 2020-01-28 DIAGNOSIS — I953 Hypotension of hemodialysis: Secondary | ICD-10-CM | POA: Diagnosis not present

## 2020-01-28 DIAGNOSIS — Z811 Family history of alcohol abuse and dependence: Secondary | ICD-10-CM

## 2020-01-28 DIAGNOSIS — I10 Essential (primary) hypertension: Secondary | ICD-10-CM | POA: Diagnosis present

## 2020-01-28 DIAGNOSIS — M545 Low back pain: Secondary | ICD-10-CM | POA: Diagnosis present

## 2020-01-28 DIAGNOSIS — D62 Acute posthemorrhagic anemia: Secondary | ICD-10-CM | POA: Diagnosis present

## 2020-01-28 DIAGNOSIS — E785 Hyperlipidemia, unspecified: Secondary | ICD-10-CM | POA: Diagnosis present

## 2020-01-28 DIAGNOSIS — T148XXA Other injury of unspecified body region, initial encounter: Secondary | ICD-10-CM | POA: Diagnosis present

## 2020-01-28 DIAGNOSIS — R011 Cardiac murmur, unspecified: Secondary | ICD-10-CM | POA: Diagnosis present

## 2020-01-28 DIAGNOSIS — Z992 Dependence on renal dialysis: Secondary | ICD-10-CM

## 2020-01-28 DIAGNOSIS — L0291 Cutaneous abscess, unspecified: Secondary | ICD-10-CM | POA: Diagnosis present

## 2020-01-28 DIAGNOSIS — M109 Gout, unspecified: Secondary | ICD-10-CM | POA: Diagnosis present

## 2020-01-28 DIAGNOSIS — L02212 Cutaneous abscess of back [any part, except buttock]: Principal | ICD-10-CM | POA: Diagnosis present

## 2020-01-28 DIAGNOSIS — Z79899 Other long term (current) drug therapy: Secondary | ICD-10-CM

## 2020-01-28 DIAGNOSIS — I4892 Unspecified atrial flutter: Secondary | ICD-10-CM | POA: Diagnosis present

## 2020-01-28 DIAGNOSIS — N189 Chronic kidney disease, unspecified: Secondary | ICD-10-CM | POA: Diagnosis present

## 2020-01-28 DIAGNOSIS — M4306 Spondylolysis, lumbar region: Secondary | ICD-10-CM | POA: Diagnosis present

## 2020-01-28 DIAGNOSIS — Z20822 Contact with and (suspected) exposure to covid-19: Secondary | ICD-10-CM | POA: Diagnosis present

## 2020-01-28 DIAGNOSIS — R6521 Severe sepsis with septic shock: Secondary | ICD-10-CM

## 2020-01-28 DIAGNOSIS — A419 Sepsis, unspecified organism: Secondary | ICD-10-CM

## 2020-01-28 DIAGNOSIS — E113511 Type 2 diabetes mellitus with proliferative diabetic retinopathy with macular edema, right eye: Secondary | ICD-10-CM | POA: Diagnosis present

## 2020-01-28 LAB — CBC WITH DIFFERENTIAL/PLATELET
Abs Immature Granulocytes: 0.17 10*3/uL — ABNORMAL HIGH (ref 0.00–0.07)
Basophils Absolute: 0.1 10*3/uL (ref 0.0–0.1)
Basophils Relative: 0 %
Eosinophils Absolute: 0.3 10*3/uL (ref 0.0–0.5)
Eosinophils Relative: 1 %
HCT: 30.8 % — ABNORMAL LOW (ref 39.0–52.0)
Hemoglobin: 9.4 g/dL — ABNORMAL LOW (ref 13.0–17.0)
Immature Granulocytes: 1 %
Lymphocytes Relative: 8 %
Lymphs Abs: 1.5 10*3/uL (ref 0.7–4.0)
MCH: 31.3 pg (ref 26.0–34.0)
MCHC: 30.5 g/dL (ref 30.0–36.0)
MCV: 102.7 fL — ABNORMAL HIGH (ref 80.0–100.0)
Monocytes Absolute: 1.8 10*3/uL — ABNORMAL HIGH (ref 0.1–1.0)
Monocytes Relative: 10 %
Neutro Abs: 14.5 10*3/uL — ABNORMAL HIGH (ref 1.7–7.7)
Neutrophils Relative %: 80 %
Platelets: 257 10*3/uL (ref 150–400)
RBC: 3 MIL/uL — ABNORMAL LOW (ref 4.22–5.81)
RDW: 13.7 % (ref 11.5–15.5)
WBC: 18.3 10*3/uL — ABNORMAL HIGH (ref 4.0–10.5)
nRBC: 0 % (ref 0.0–0.2)

## 2020-01-28 LAB — COMPREHENSIVE METABOLIC PANEL
ALT: 14 U/L (ref 0–44)
AST: 20 U/L (ref 15–41)
Albumin: 3 g/dL — ABNORMAL LOW (ref 3.5–5.0)
Alkaline Phosphatase: 122 U/L (ref 38–126)
Anion gap: 15 (ref 5–15)
BUN: 49 mg/dL — ABNORMAL HIGH (ref 8–23)
CO2: 29 mmol/L (ref 22–32)
Calcium: 8.4 mg/dL — ABNORMAL LOW (ref 8.9–10.3)
Chloride: 93 mmol/L — ABNORMAL LOW (ref 98–111)
Creatinine, Ser: 10.78 mg/dL — ABNORMAL HIGH (ref 0.61–1.24)
GFR calc Af Amer: 5 mL/min — ABNORMAL LOW (ref >60)
GFR calc non Af Amer: 4 mL/min — ABNORMAL LOW (ref >60)
Glucose, Bld: 277 mg/dL — ABNORMAL HIGH (ref 70–99)
Potassium: 3.9 mmol/L (ref 3.5–5.1)
Sodium: 137 mmol/L (ref 135–145)
Total Bilirubin: 0.6 mg/dL (ref 0.3–1.2)
Total Protein: 7.7 g/dL (ref 6.5–8.1)

## 2020-01-28 LAB — GLUCOSE, CAPILLARY
Glucose-Capillary: 199 mg/dL — ABNORMAL HIGH (ref 70–99)
Glucose-Capillary: 275 mg/dL — ABNORMAL HIGH (ref 70–99)

## 2020-01-28 LAB — APTT: aPTT: 41 seconds — ABNORMAL HIGH (ref 24–36)

## 2020-01-28 LAB — SARS CORONAVIRUS 2 BY RT PCR (HOSPITAL ORDER, PERFORMED IN ~~LOC~~ HOSPITAL LAB): SARS Coronavirus 2: NEGATIVE

## 2020-01-28 LAB — LACTIC ACID, PLASMA
Lactic Acid, Venous: 1.6 mmol/L (ref 0.5–1.9)
Lactic Acid, Venous: 1.8 mmol/L (ref 0.5–1.9)

## 2020-01-28 LAB — PROTIME-INR
INR: 1.3 — ABNORMAL HIGH (ref 0.8–1.2)
Prothrombin Time: 16.1 seconds — ABNORMAL HIGH (ref 11.4–15.2)

## 2020-01-28 LAB — HEMOGLOBIN A1C
Hgb A1c MFr Bld: 6.1 % — ABNORMAL HIGH (ref 4.8–5.6)
Mean Plasma Glucose: 128.37 mg/dL

## 2020-01-28 MED ORDER — ACETAMINOPHEN 650 MG RE SUPP: 650.0000 mg | Freq: Four times a day (QID) | RECTAL | Status: DC | PRN

## 2020-01-28 MED ORDER — OXYCODONE-ACETAMINOPHEN 5-325 MG PO TABS
1.0000 | ORAL_TABLET | Freq: Once | ORAL | Status: AC
  Administered 2020-01-28: 1 via ORAL
  Filled 2020-01-28: qty 1

## 2020-01-28 MED ORDER — APIXABAN 5 MG PO TABS: 5.0000 mg | ORAL_TABLET | Freq: Two times a day (BID) | ORAL | Status: DC

## 2020-01-28 MED ORDER — OXYCODONE HCL 5 MG PO TABS: 5.0000 mg | ORAL_TABLET | ORAL | Status: DC | PRN

## 2020-01-28 MED ORDER — DOXAZOSIN MESYLATE 2 MG PO TABS
8.0000 mg | ORAL_TABLET | Freq: Every day | ORAL | Status: DC
  Filled 2020-01-28: qty 4

## 2020-01-28 MED ORDER — OFLOXACIN 0.3 % OP SOLN
1.0000 [drp] | Freq: Four times a day (QID) | OPHTHALMIC | Status: DC
  Administered 2020-01-28 – 2020-02-01 (×13): 1 [drp] via OPHTHALMIC
  Filled 2020-01-28: qty 5

## 2020-01-28 MED ORDER — VANCOMYCIN HCL IN DEXTROSE 1-5 GM/200ML-% IV SOLN: 1000.0000 mg | Freq: Once | INTRAVENOUS | Status: DC

## 2020-01-28 MED ORDER — IOHEXOL 300 MG/ML  SOLN
100.0000 mL | Freq: Once | INTRAMUSCULAR | Status: AC | PRN
  Administered 2020-01-28: 100 mL via INTRAVENOUS

## 2020-01-28 MED ORDER — TRAZODONE HCL 50 MG PO TABS: 50.0000 mg | ORAL_TABLET | Freq: Every evening | ORAL | Status: DC | PRN

## 2020-01-28 MED ORDER — ATORVASTATIN CALCIUM 40 MG PO TABS: 40.0000 mg | ORAL_TABLET | ORAL | Status: DC

## 2020-01-28 MED ORDER — ASPIRIN EC 81 MG PO TBEC
81.0000 mg | DELAYED_RELEASE_TABLET | ORAL | Status: DC
  Administered 2020-01-29 – 2020-01-31 (×4): 81 mg via ORAL
  Filled 2020-01-28 (×9): qty 1

## 2020-01-28 MED ORDER — TORSEMIDE 20 MG PO TABS
20.0000 mg | ORAL_TABLET | Freq: Two times a day (BID) | ORAL | Status: DC
  Administered 2020-01-29 (×2): 20 mg via ORAL
  Filled 2020-01-28 (×2): qty 1

## 2020-01-28 MED ORDER — OFLOXACIN 0.3 % OP SOLN
1.0000 [drp] | Freq: Four times a day (QID) | OPHTHALMIC | Status: DC
  Filled 2020-01-28: qty 5

## 2020-01-28 MED ORDER — LIDOCAINE HCL (PF) 2 % IJ SOLN
INTRAMUSCULAR | Status: AC
  Filled 2020-01-28: qty 10

## 2020-01-28 MED ORDER — INSULIN ASPART 100 UNIT/ML ~~LOC~~ SOLN
0.0000 [IU] | Freq: Every day | SUBCUTANEOUS | Status: DC
  Administered 2020-01-28 – 2020-01-29 (×2): 3 [IU] via SUBCUTANEOUS

## 2020-01-28 MED ORDER — SODIUM CHLORIDE 0.9 % IV SOLN
1.0000 g | INTRAVENOUS | Status: DC
  Administered 2020-01-28 – 2020-01-31 (×4): 1 g via INTRAVENOUS
  Filled 2020-01-28 (×6): qty 1

## 2020-01-28 MED ORDER — VANCOMYCIN HCL IN DEXTROSE 1-5 GM/200ML-% IV SOLN
1000.0000 mg | INTRAVENOUS | Status: DC
  Administered 2020-01-29 – 2020-01-31 (×2): 1000 mg via INTRAVENOUS
  Filled 2020-01-28 (×2): qty 200

## 2020-01-28 MED ORDER — ATORVASTATIN CALCIUM 40 MG PO TABS
40.0000 mg | ORAL_TABLET | Freq: Every day | ORAL | Status: DC
  Administered 2020-01-29 – 2020-02-01 (×4): 40 mg via ORAL
  Filled 2020-01-28 (×4): qty 1

## 2020-01-28 MED ORDER — PANTOPRAZOLE SODIUM 40 MG PO TBEC
40.0000 mg | DELAYED_RELEASE_TABLET | Freq: Every day | ORAL | Status: DC
  Administered 2020-01-29 – 2020-02-01 (×4): 40 mg via ORAL
  Filled 2020-01-28 (×4): qty 1

## 2020-01-28 MED ORDER — LIDOCAINE HCL (PF) 1 % IJ SOLN: 5.0000 mL | Freq: Once | INTRAMUSCULAR | Status: DC

## 2020-01-28 MED ORDER — ONDANSETRON HCL 4 MG/2ML IJ SOLN: 4.0000 mg | Freq: Four times a day (QID) | INTRAMUSCULAR | Status: DC | PRN

## 2020-01-28 MED ORDER — HYDROCODONE-ACETAMINOPHEN 5-325 MG PO TABS
1.0000 | ORAL_TABLET | Freq: Four times a day (QID) | ORAL | Status: DC | PRN
  Administered 2020-01-31: 1 via ORAL
  Filled 2020-01-28: qty 1

## 2020-01-28 MED ORDER — METOPROLOL TARTRATE 50 MG PO TABS: 100.0000 mg | ORAL_TABLET | Freq: Two times a day (BID) | ORAL | Status: DC

## 2020-01-28 MED ORDER — POTASSIUM CHLORIDE CRYS ER 10 MEQ PO TBCR
10.0000 meq | EXTENDED_RELEASE_TABLET | Freq: Every day | ORAL | Status: DC
  Administered 2020-01-29 – 2020-02-01 (×4): 10 meq via ORAL
  Filled 2020-01-28 (×4): qty 1

## 2020-01-28 MED ORDER — SUCROFERRIC OXYHYDROXIDE 500 MG PO CHEW
500.0000 mg | CHEWABLE_TABLET | Freq: Three times a day (TID) | ORAL | Status: DC
  Administered 2020-01-28 – 2020-02-01 (×11): 500 mg via ORAL
  Filled 2020-01-28 (×15): qty 1

## 2020-01-28 MED ORDER — ALLOPURINOL 100 MG PO TABS
100.0000 mg | ORAL_TABLET | ORAL | Status: DC
  Administered 2020-01-29 – 2020-02-01 (×4): 100 mg via ORAL
  Filled 2020-01-28 (×6): qty 1

## 2020-01-28 MED ORDER — LOSARTAN POTASSIUM 50 MG PO TABS
100.0000 mg | ORAL_TABLET | Freq: Every day | ORAL | Status: DC
  Filled 2020-01-28: qty 2

## 2020-01-28 MED ORDER — HYDRALAZINE HCL 25 MG PO TABS: 50.0000 mg | ORAL_TABLET | Freq: Two times a day (BID) | ORAL | Status: DC

## 2020-01-28 MED ORDER — ACETAMINOPHEN 325 MG PO TABS: 650.0000 mg | ORAL_TABLET | Freq: Four times a day (QID) | ORAL | Status: DC | PRN

## 2020-01-28 MED ORDER — ALBUTEROL SULFATE (2.5 MG/3ML) 0.083% IN NEBU: 2.5000 mg | INHALATION_SOLUTION | RESPIRATORY_TRACT | Status: DC | PRN

## 2020-01-28 MED ORDER — AMLODIPINE BESYLATE 5 MG PO TABS
10.0000 mg | ORAL_TABLET | Freq: Every day | ORAL | Status: DC
  Filled 2020-01-28: qty 2

## 2020-01-28 MED ORDER — HYDROMORPHONE HCL 1 MG/ML IJ SOLN: 0.5000 mg | INTRAMUSCULAR | Status: DC | PRN

## 2020-01-28 MED ORDER — SODIUM CHLORIDE 0.9% FLUSH: 3.0000 mL | INTRAVENOUS | Status: DC | PRN

## 2020-01-28 MED ORDER — CARVEDILOL 12.5 MG PO TABS
25.0000 mg | ORAL_TABLET | Freq: Two times a day (BID) | ORAL | Status: DC
  Administered 2020-01-29: 25 mg via ORAL
  Filled 2020-01-28 (×2): qty 2

## 2020-01-28 MED ORDER — ADULT MULTIVITAMIN W/MINERALS CH
1.0000 | ORAL_TABLET | Freq: Every day | ORAL | Status: DC
  Administered 2020-01-29 – 2020-02-01 (×4): 1 via ORAL
  Filled 2020-01-28 (×4): qty 1

## 2020-01-28 MED ORDER — LABETALOL HCL 5 MG/ML IV SOLN: 10.0000 mg | INTRAVENOUS | Status: DC | PRN

## 2020-01-28 MED ORDER — SODIUM CHLORIDE 0.9% FLUSH
3.0000 mL | Freq: Two times a day (BID) | INTRAVENOUS | Status: DC
  Administered 2020-01-28 – 2020-01-31 (×6): 3 mL via INTRAVENOUS

## 2020-01-28 MED ORDER — INSULIN ASPART 100 UNIT/ML ~~LOC~~ SOLN
0.0000 [IU] | Freq: Three times a day (TID) | SUBCUTANEOUS | Status: DC
  Administered 2020-01-29: 1 [IU] via SUBCUTANEOUS
  Administered 2020-01-29: 2 [IU] via SUBCUTANEOUS
  Administered 2020-01-30: 1 [IU] via SUBCUTANEOUS
  Administered 2020-01-30: 2 [IU] via SUBCUTANEOUS
  Administered 2020-01-31: 1 [IU] via SUBCUTANEOUS

## 2020-01-28 MED ORDER — SODIUM CHLORIDE 0.9 % IV SOLN
250.0000 mL | INTRAVENOUS | Status: DC | PRN
  Administered 2020-01-29: 500 mL via INTRAVENOUS

## 2020-01-28 MED ORDER — POLYETHYLENE GLYCOL 3350 17 G PO PACK: 17.0000 g | PACK | Freq: Every day | ORAL | Status: DC | PRN

## 2020-01-28 MED ORDER — PREDNISOLONE ACETATE 1 % OP SUSP: 1.0000 [drp] | Freq: Four times a day (QID) | OPHTHALMIC | Status: DC

## 2020-01-28 MED ORDER — ALLOPURINOL 300 MG PO TABS
300.0000 mg | ORAL_TABLET | Freq: Every day | ORAL | Status: DC
  Administered 2020-01-29 – 2020-02-01 (×4): 300 mg via ORAL
  Filled 2020-01-28 (×4): qty 1

## 2020-01-28 MED ORDER — HYDRALAZINE HCL 25 MG PO TABS
100.0000 mg | ORAL_TABLET | Freq: Two times a day (BID) | ORAL | Status: DC
  Filled 2020-01-28 (×6): qty 2

## 2020-01-28 MED ORDER — TRAMADOL HCL 50 MG PO TABS: 100.0000 mg | ORAL_TABLET | Freq: Four times a day (QID) | ORAL | Status: DC | PRN

## 2020-01-28 MED ORDER — INSULIN ASPART 100 UNIT/ML ~~LOC~~ SOLN
2.0000 [IU] | Freq: Three times a day (TID) | SUBCUTANEOUS | Status: DC
  Administered 2020-01-29 – 2020-02-01 (×9): 2 [IU] via SUBCUTANEOUS

## 2020-01-28 MED ORDER — VANCOMYCIN HCL 2000 MG/400ML IV SOLN
2000.0000 mg | Freq: Once | INTRAVENOUS | Status: AC
  Administered 2020-01-28: 2000 mg via INTRAVENOUS
  Filled 2020-01-28: qty 400

## 2020-01-28 MED ORDER — PIPERACILLIN-TAZOBACTAM IN DEX 2-0.25 GM/50ML IV SOLN
2.2500 g | Freq: Three times a day (TID) | INTRAVENOUS | Status: DC
  Filled 2020-01-28 (×4): qty 50

## 2020-01-28 MED ORDER — SODIUM CHLORIDE 0.9 % IV SOLN: INTRAVENOUS | Status: DC

## 2020-01-28 MED ORDER — ONDANSETRON HCL 4 MG PO TABS: 4.0000 mg | ORAL_TABLET | Freq: Four times a day (QID) | ORAL | Status: DC | PRN

## 2020-01-28 MED ORDER — INSULIN GLARGINE 100 UNIT/ML ~~LOC~~ SOLN
10.0000 [IU] | Freq: Every day | SUBCUTANEOUS | Status: DC
  Administered 2020-01-28 – 2020-01-31 (×4): 10 [IU] via SUBCUTANEOUS
  Filled 2020-01-28 (×5): qty 0.1

## 2020-01-28 MED ORDER — PREDNISOLONE ACETATE 1 % OP SUSP
1.0000 [drp] | Freq: Four times a day (QID) | OPHTHALMIC | Status: DC
  Administered 2020-01-28 – 2020-02-01 (×14): 1 [drp] via OPHTHALMIC
  Filled 2020-01-28: qty 1
  Filled 2020-01-28: qty 5

## 2020-01-28 MED ORDER — CYCLOBENZAPRINE HCL 10 MG PO TABS: 5.0000 mg | ORAL_TABLET | Freq: Three times a day (TID) | ORAL | Status: DC | PRN

## 2020-01-28 NOTE — Progress Notes (Signed)
   01/28/20 2333  Assess: MEWS Score  Level of Consciousness Alert  Assess: MEWS Score  MEWS Temp 0  MEWS Systolic 1  MEWS Pulse 2  MEWS RR 0  MEWS LOC 0  MEWS Score 3  MEWS Score Color Yellow  Assess: if the MEWS score is Yellow or Red  Were vital signs taken at a resting state? Yes  Focused Assessment Documented focused assessment  Early Detection of Sepsis Score *See Row Information* Low  MEWS guidelines implemented *See Row Information* No, vital signs rechecked  Treat  MEWS Interventions Other (Comment) (notified Provider,  monitor pt.)  Take Vital Signs  Increase Vital Sign Frequency  Yellow: Q 2hr X 2 then Q 4hr X 2, if remains yellow, continue Q 4hrs  Escalate  MEWS: Escalate Yellow: discuss with charge nurse/RN and consider discussing with provider and RRT  Notify: Charge Nurse/RN  Name of Charge Nurse/RN Notified Kalman Drape  Date Charge Nurse/RN Notified 01/28/20  Time Charge Nurse/RN Notified 2335  Notify: Provider  Provider Name/Title Jeannette Corpus  Date Provider Notified 01/28/20  Time Provider Notified 2333  Notification Type Page  Notification Reason Other (Comment) (BP 85/46, HR-122, hold med)  Response Other (Comment) (waiting on provider)  Document  Patient Outcome Other (Comment) (stable)

## 2020-01-28 NOTE — ED Provider Notes (Signed)
[facility] EMERGENCY DEPARTMENT Provider Note   CSN: [id number] Arrival date & time: 01/28/20  8592     History Chief Complaint  Patient presents with  . Abscess    Justin [name] is a 74 y.o. male w/ ESRD on MWF dialysis, Dm2, presenting to emergency department with lower back pain and abscess.  Patient reports he has had a growth in his lower back over the lumbar spine for about 6 months.  He says it opened up and began draining and bleeding yesterday.  He denies any fevers or chills.  He says the location of this abscess coincides with a place where "they put in a needle in my back a few months ago" but cannot clarify why this was done (I see no record of this procedure at our hospital in the past year).  He has not missed any dialysis He does produce a small amount of urine  He has received his covid vaccines  HPI     Past Medical History:  Diagnosis Date  . Anemia   . Anemia in chronic kidney disease 11/20/2015  . Cancer [facility]) 2009   Prostate; radiation seeds  . Chronic kidney disease    stage 4  . Diabetes mellitus   . Gout   . Hypertension     Patient Active Problem List   Diagnosis Date Noted  . Wound infection 01/28/2020  . 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 H/O vitrectomy 12/07/2019  . Macular pucker, 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  . Follow-up examination after eye surgery 10/24/2019  . Vitreous hemorrhage of right eye ([place]) 10/24/2019  . Proliferative diabetic retinopathy of right eye ([place]) 10/24/2019  . Proliferative diabetic retinopathy of left eye ([place]) 10/24/2019  . ESRD on dialysis ([place]) 07/29/2017  . Anemia in chronic kidney disease 11/20/2015  . Blurred vision, left eye 07/10/2011  . HTN (hypertension) 07/10/2011  . DM (diabetes mellitus) ([place]) 07/10/2011  . CKD (chronic kidney disease) 07/10/2011    Past Surgical History:  Procedure Laterality Date  . A/V  FISTULAGRAM Left 08/17/2017   Procedure: A/V FISTULAGRAM;  Surgeon: Katha Cabal, MD;  Location: [facility];  Service: Cardiovascular;  Laterality: Left;  . A/V SHUNT INTERVENTION N/A 08/17/2017   Procedure: A/V SHUNT INTERVENTION;  Surgeon: Katha Cabal, MD;  Location: [place] [facility];  Service: Cardiovascular;  Laterality: N/A;  . AV FISTULA PLACEMENT Left 05/31/2015   Procedure: ARTERIOVENOUS (AV) FISTULA CREATION;  Surgeon: Katha Cabal, MD;  Location: [facility];  Service: Vascular;  Laterality: Left;  . excision bone spurs Bilateral 1989   feet  . KNEE SURGERY Left 1998   arthroscopy  . SHOULDER SURGERY Left 1994   rotator cuff       Family History  Problem Relation Age of Onset  . Kidney disease Mother   . Alcoholism Father   . Alcoholism Brother     Social History   Tobacco Use  . Smoking status: Former Smoker    Quit date: 10/01/1978    Years since quitting: 41.3  . Smokeless tobacco: Never Used  Substance Use Topics  . Alcohol use: No  . Drug use: No    Home Medications Prior to Admission medications   Medication Sig Start Date End Date Taking? Authorizing Provider  allopurinol (ZYLOPRIM) 300 MG tablet Take 300 mg by mouth daily. 10/13/19  Yes [provider]  atorvastatin (LIPITOR) 40 MG tablet  Take 40 mg by mouth daily.  10/30/14  Yes [provider]  ELIQUIS 5 MG TABS tablet Take 5 mg by mouth 2 (two) times daily. 10/07/19  Yes [provider]  metoprolol tartrate (LOPRESSOR) 100 MG tablet Take 100 mg by mouth 2 (two) times daily. 11/25/19  Yes [provider]  omeprazole (PRILOSEC) 20 MG capsule Take 20 mg by mouth every morning.    Yes [provider]    Allergies    Patient has no known allergies.  Review of Systems   Review of Systems  Constitutional: Negative for chills and fever.  HENT: Negative for ear pain and sore throat.   Eyes: Negative for pain and visual disturbance.    Respiratory: Negative for cough and shortness of breath.   Cardiovascular: Negative for chest pain and palpitations.  Gastrointestinal: Negative for abdominal pain, nausea and vomiting.  Genitourinary: Negative for dysuria and hematuria.  Musculoskeletal: Negative for arthralgias and back pain.  Skin: Positive for rash and wound.  Neurological: Negative for syncope and numbness.  Psychiatric/Behavioral: Negative for agitation and confusion.  All other systems reviewed and are negative.   Physical Exam Updated Vital Signs BP 96/69 (BP Location: Right Arm)   Pulse 65   Temp 99.2 F (37.3 C) (Oral)   Resp 18   Ht 5\' 10"  (1.778 m)   Wt 104.3 kg   SpO2 100%   BMI 33.00 kg/m   Physical Exam Vitals and nursing note reviewed.  Constitutional:      Appearance: He is well-developed.  HENT:     Head: Normocephalic and atraumatic.  Eyes:     Conjunctiva/sclera: Conjunctivae normal.  Cardiovascular:     Rate and Rhythm: Normal rate and regular rhythm.     Pulses: Normal pulses.  Pulmonary:     Effort: Pulmonary effort is normal. No respiratory distress.     Breath sounds: Normal breath sounds.  Abdominal:     Palpations: Abdomen is soft.     Tenderness: There is no abdominal tenderness.  Musculoskeletal:     Cervical back: Neck supple.  Skin:    General: Skin is warm and dry.     Comments: Large firm 5x5 cm mass overlying midline of lower back as pictured below with small amount of purulent drainage  Neurological:     General: No focal deficit present.     Mental Status: He is alert and oriented to person, place, and time.        ED Results / Procedures / Treatments   Labs (all labs ordered are listed, but only abnormal results are displayed) Labs Reviewed  COMPREHENSIVE METABOLIC PANEL - Abnormal; Notable for the following components:      Result Value   Chloride 93 (*)    Glucose, Bld 277 (*)    BUN 49 (*)    Creatinine, Ser 10.78 (*)    Calcium 8.4 (*)     Albumin 3.0 (*)    GFR calc non Af Amer 4 (*)    GFR calc Af Amer 5 (*)    All other components within normal limits  CBC WITH DIFFERENTIAL/PLATELET - Abnormal; Notable for the following components:   WBC 18.3 (*)    RBC 3.00 (*)    Hemoglobin 9.4 (*)    HCT 30.8 (*)    MCV 102.7 (*)    Neutro Abs 14.5 (*)    Monocytes Absolute 1.8 (*)    Abs Immature Granulocytes 0.17 (*)    All other  components within normal limits  PROTIME-INR - Abnormal; Notable for the following components:   Prothrombin Time 16.1 (*)    INR 1.3 (*)    All other components within normal limits  APTT - Abnormal; Notable for the following components:   aPTT 41 (*)    All other components within normal limits  CULTURE, BLOOD (ROUTINE X 2)  CULTURE, BLOOD (ROUTINE X 2)  SARS CORONAVIRUS 2 BY RT PCR (HOSPITAL ORDER, [facility])  URINE CULTURE  AEROBIC/ANAEROBIC CULTURE (SURGICAL/DEEP WOUND)  LACTIC ACID, PLASMA  LACTIC ACID, PLASMA  URINALYSIS, ROUTINE W REFLEX MICROSCOPIC  BASIC METABOLIC PANEL  CBC  HEMOGLOBIN A1C    EKG EKG Interpretation  Date/Time:  Sunday January 28 2020 09:47:12 EDT Ventricular Rate:  127 PR Interval:    QRS Duration: 89 QT Interval:  345 QTC Calculation: 502 R Axis:   73 Text Interpretation: Sinus tachycardia Anterior infarct, old Nonspecific T abnormalities, inferior leads Prolonged QT interval Artifact in lead(s) I III aVR aVL No STEMI Confirmed by Octaviano Glow [id number]) on 01/28/2020 9:50:10 AM   Radiology DG Chest 2 View  Result Date: 01/28/2020 CLINICAL DATA:  Sepsis. EXAM: CHEST - 2 VIEW COMPARISON:  December 30, 2016. FINDINGS: Stable cardiomediastinal silhouette. No pneumothorax or pleural effusion is noted. Lungs are clear. Bony thorax is unremarkable. IMPRESSION: No active cardiopulmonary disease. Aortic Atherosclerosis (ICD10-I70.0). Electronically Signed   By: Marijo Conception M.D.   On: 01/28/2020 10:54   CT ABDOMEN PELVIS W  CONTRAST  Result Date: 01/28/2020 CLINICAL DATA:  Large fungating mass overlying the lumbar spine on the skin for the past 6 months. Clinical concern for infection with abscess. The patient is on dialysis. EXAM: CT ABDOMEN AND PELVIS WITH CONTRAST TECHNIQUE: Multidetector CT imaging of the abdomen and pelvis was performed using the standard protocol following bolus administration of intravenous contrast. CONTRAST:  130mL OMNIPAQUE IOHEXOL 300 MG/ML  SOLN COMPARISON:  None. FINDINGS: Lower chest: Atheromatous calcifications, including the coronary arteries and aorta. Hepatobiliary: No focal liver abnormality is seen. No gallstones, gallbladder wall thickening, or biliary dilatation. Pancreas: Unremarkable. No pancreatic ductal dilatation or surrounding inflammatory changes. Spleen: Normal in size without focal abnormality. Adrenals/Urinary Tract: Mild diffuse bilateral adrenal hyperplasia. Small kidneys. Multiple small rounded low density masses in both kidneys. Some of these have fluid density, compatible with cysts. Others are slightly higher than fluid density. The largest of these is in the lower pole of the left kidney, measuring 1.2 cm in maximum diameter and 20 Hounsfield units in density on the delayed images through the kidneys (image number 22 series 7). Poorly distended bladder with mild diffuse wall thickening. Unremarkable ureters. No urinary tract calculi or hydronephrosis. Stomach/Bowel: Small hiatal hernia. Minimal sigmoid colon diverticulosis. Normal appearing appendix. Unremarkable small bowel. Vascular/Lymphatic: Atheromatous arterial calcifications without aneurysm. No enlarged lymph nodes. Reproductive: Moderate Lea enlarged prostate gland. Other: Rounded fluid and gas collection in the subcutaneous fat at the level of the lumbosacral junction, measuring 5.9 x 5.6 x 5.4 cm on image number 48 series 2 and sagittal image number 69. There is mild diffuse surrounding soft tissue thickening with  overlying skin thickening. Small to moderate-sized umbilical hernia containing fat. 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 Lumbar and lower thoracic spine degenerative changes. IMPRESSION: 1. 5.9 x 5.6 x 5.4 cm subcutaneous abscess at the level of the lumbosacral junction posteriorly. A necrotic malignancy is less likely. 2. Moderate prostatic hypertrophy.  3. 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 4. Minimal sigmoid colon diverticulosis. 5. Small hiatal hernia. 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 Aortic Atherosclerosis (ICD10-I70.0). Electronically Signed   By: Claudie Revering M.D.   On: 01/28/2020 12:23    Procedures .Marland KitchenIncision and Drainage  Date/Time: 01/28/2020 5:29 PM Performed by: Wyvonnia Dusky, MD Authorized by: Wyvonnia Dusky, MD   Consent:    Consent obtained:  Verbal   Consent given by:  Patient   Risks discussed:  Bleeding and incomplete drainage Location:    Type:  Abscess   Size:  5 cm   Location:  Trunk   Trunk location:  Back Pre-procedure details:    Skin preparation:  Chloraprep Anesthesia (see MAR for exact dosages):    Anesthesia method:  None Procedure type:    Complexity:  Simple Procedure details:    Needle aspiration: no     Incision types:  Stab incision   Incision depth:  Subcutaneous   Wound management:  Probed and deloculated   Drainage:  Purulent and bloody   Drainage amount:  Moderate   Wound treatment:  Wound left open   Packing materials:  None .Critical Care Performed by: Wyvonnia Dusky, MD Authorized by: Wyvonnia Dusky, MD   Critical care provider statement:    Critical care time (minutes):  45   Critical care was necessary to treat or prevent imminent or life-threatening deterioration of the following conditions:  Sepsis   Critical  care was time spent personally by me on the following activities:  Discussions with consultants, evaluation of patient's response to treatment, examination of patient, ordering and performing treatments and interventions, ordering and review of laboratory studies, ordering and review of radiographic studies, pulse oximetry, re-evaluation of patient's condition, obtaining history from patient or surrogate and review of old charts   (including critical care time)  Medications Ordered in ED Medications  ceFEPIme (MAXIPIME) 1 g in sodium chloride 0.9 % 100 mL IVPB (0 g Intravenous Stopped 01/28/20 1259)  lidocaine (PF) (XYLOCAINE) 1 % injection 5 mL (5 mLs Other Not Given 01/28/20 1322)  pantoprazole (PROTONIX) EC tablet 40 mg (has no administration in time range)  insulin glargine (LANTUS) injection 10 Units (has no administration in time range)  losartan (COZAAR) tablet 100 mg (100 mg Oral Not Given 01/28/20 1655)  doxazosin (CARDURA) tablet 8 mg (has no administration in time range)  carvedilol (COREG) tablet 25 mg (25 mg Oral Not Given 01/28/20 1657)  potassium chloride (KLOR-CON) CR tablet 10 mEq (10 mEq Oral Not Given 01/28/20 1656)  allopurinol (ZYLOPRIM) tablet 100 mg (has no administration in time range)  HYDROcodone-acetaminophen (NORCO/VICODIN) 5-325 MG per tablet 1 tablet (has no administration in time range)  traMADol (ULTRAM) tablet 100 mg (has no administration in time range)  cyclobenzaprine (FLEXERIL) tablet 5 mg (has no administration in time range)  torsemide (DEMADEX) tablet 20 mg (has no administration in time range)  aspirin EC tablet 81 mg (has no administration in time range)  sucroferric oxyhydroxide (VELPHORO) chewable tablet 500 mg (has no administration in time range)  allopurinol (ZYLOPRIM) tablet 300 mg (has no administration in time range)  amLODipine (NORVASC) tablet 10 mg (10 mg Oral Not Given 01/28/20 1654)  atorvastatin (LIPITOR) tablet 40 mg (has no administration in  time range)  hydrALAZINE (APRESOLINE) tablet 100 mg (has no administration in time range)  ofloxacin (OCUFLOX) 0.3 % ophthalmic solution 1 drop (has no administration  in time range)  prednisoLONE acetate (PRED FORTE) 1 % ophthalmic suspension 1 drop (has no administration in time range)  ofloxacin (OCUFLOX) 0.3 % ophthalmic solution 1 drop (has no administration in time range)  apixaban (ELIQUIS) tablet 5 mg (has no administration in time range)  sodium chloride flush (NS) 0.9 % injection 3 mL (has no administration in time range)  sodium chloride flush (NS) 0.9 % injection 3 mL (has no administration in time range)  0.9 %  sodium chloride infusion (has no administration in time range)  traZODone (DESYREL) tablet 50 mg (has no administration in time range)  polyethylene glycol (MIRALAX / GLYCOLAX) packet 17 g (has no administration in time range)  ondansetron (ZOFRAN) tablet 4 mg (has no administration in time range)    Or  ondansetron (ZOFRAN) injection 4 mg (has no administration in time range)  albuterol (PROVENTIL) (2.5 MG/3ML) 0.083% nebulizer solution 2.5 mg (has no administration in time range)  acetaminophen (TYLENOL) tablet 650 mg (has no administration in time range)    Or  acetaminophen (TYLENOL) suppository 650 mg (has no administration in time range)  oxyCODONE (Oxy IR/ROXICODONE) immediate release tablet 5 mg (has no administration in time range)  HYDROmorphone (DILAUDID) injection 0.5-1 mg (has no administration in time range)  multivitamin with minerals tablet 1 tablet (has no administration in time range)  insulin aspart (novoLOG) injection 0-6 Units (has no administration in time range)  insulin aspart (novoLOG) injection 0-5 Units (has no administration in time range)  insulin aspart (novoLOG) injection 2 Units (has no administration in time range)  vancomycin (VANCOCIN) IVPB 1000 mg/200 mL premix (has no administration in time range)  iohexol (OMNIPAQUE) 300 MG/ML  solution 100 mL (100 mLs Intravenous Contrast Given 01/28/20 1127)  vancomycin (VANCOREADY) IVPB 2000 mg/400 mL (0 mg Intravenous Stopped 01/28/20 1308)  lidocaine HCl (PF) (XYLOCAINE) 2 % injection (  Given by Other 01/28/20 1258)  oxyCODONE-acetaminophen (PERCOCET/ROXICET) 5-325 MG per tablet 1 tablet (1 tablet Oral Given 01/28/20 1359)    ED Course  I have reviewed the triage vital signs and the nursing notes.  Pertinent labs & imaging results that were available during my care of the patient were reviewed by me and considered in my medical decision making (see chart for details).   74 yo male presenting to ED with large fungating mass on lower back, ongoing for 6 months but draining since yesterday.  He otherwise feels well.  He presents tachycardic with a WBC 18.3, and afebrile.  With these vitals and this source of infection, I initiated a sepsis w/u and started empiric antibiotics. I held his fluid bolus for sepsis given his ESRD on dialysis, and his stable blood pressure here not suggestive of septic shock.    I personally reviewed his labs as noted.  Lactate 1.8.  I personally reviewed his xray which showed no pulm consolidations or significant edema  I reveiwed his ECG which shows sinus tachycardia without acute ischemic.  Telemetry showed sinus tachycardia per review.  I consulted with Dr Arnoldo Morale from general surgery who recommended a simple bedside I&D to help the wound drain overnight, and medical admission for re-evaluation in the morning.  Unclear if this will require OR debridement at this time.    Clinical Course as of Jan 27 1733  Sun Jan 28, 2020  1034 WBC(!): 18.3 [MT]  1241 IMPRESSION: 1. 5.9 x 5.6 x 5.4 cm subcutaneous abscess at the level of the lumbosacral junction posteriorly. A necrotic malignancy is less  likely. 2. Moderate prostatic hypertrophy. 3. Mild diffuse bladder wall thickening compatible with chronic bladder outlet obstruction by the enlarged prostate  gland. 4. Minimal sigmoid colon diverticulosis. 5. Small hiatal hernia. 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   [MT]  2706 I spoke to Dr Arnoldo Morale from general surgery who recommended medical admission and he will assess patient tomorrow for possible OR washout   [MT]  1353 Performed bedside I&D with simple stab incision per Dr Adline Mango request to initiate drainage and get wound culture.  It is draining to air now.  Admission given to hospitalist Dr Denton Brick now   [MT]    Clinical Course User Index [MT] Adalid Beckmann, Carola Rhine, MD    Final Clinical Impression(s) / ED Diagnoses Final diagnoses:  Abscess  Sepsis, due to unspecified organism, unspecified whether acute organ dysfunction present [facility])    Rx / DC Orders ED Discharge Orders    None       Bastien Strawser, Carola Rhine, MD 01/28/20 1735

## 2020-01-28 NOTE — Progress Notes (Signed)
Pharmacy Antibiotic Note  Justin [name] is a 74 y.o. male HD patient admitted on 01/28/2020 with wound infection. Pharmacy has been consulted for vancomycin and cefepime dosing.  Plan: Start cefepime 1g IV q24h Give vancomycin 2g IV  x1 dose, then vancomycin 1g IV after each HD session (clarify days)  Goal vancomycin trough range:    15-20   mcg/mL Pharmacy will continue to monitor renal function, vancomycin troughs as clinically appropriate,  cultures and patient progress.   Height: 5\' 10"  (177.8 cm) Weight: 104.3 kg (230 lb) IBW/kg (Calculated) : 73  Temp (24hrs), Avg:98.1 F (36.7 C), Min:98.1 F (36.7 C), Max:98.1 F (36.7 C)  Recent Labs  Lab 01/28/20 0957  WBC 18.3*  CREATININE 10.78*  LATICACIDVEN 1.6    Estimated Creatinine Clearance: 7.2 mL/min (A) (by C-G formula based on SCr of 10.78 mg/dL (H)).    No Known Allergies  Antimicrobials this admission: vancomycin 7/18 >>  cefepime 7/18 >>    Dose adjustments this admission: vancomycin, cefepime  Microbiology results: 7/18 [facility] x2:  pending 7/18 UCx: pending    Thank you for allowing pharmacy to be a part of this patient's care.  Despina Pole 01/28/2020 11:43 AM

## 2020-01-28 NOTE — ED Triage Notes (Signed)
Pt reports ruptured abscess to lower back x 6 months.  Reports area open and bleeding.  C/O burning

## 2020-01-28 NOTE — ED Notes (Signed)
Patient has refused the Covid test twice stating he will go home rather than stay in the hospital if the test is required.

## 2020-01-28 NOTE — ED Notes (Addendum)
Patient transported to CT 

## 2020-01-28 NOTE — H&P (Signed)
Patient Demographics:    Justin [name], is a 74 y.o. male  MRN: [id number]   DOB - 10-Aug-1945  Admit Date - 01/28/2020  Outpatient Primary MD for the patient is [name], [name], MD   Assessment & Plan:    Principal Problem:   Wound infection Active Problems:   ESRD on dialysis ([place])   DM (diabetes mellitus) ([place])   HTN (hypertension)   Anemia in chronic kidney disease   CT AP IMPRESSION: -- 5.9 x 5.6 x 5.4 cm subcutaneous abscess at the level of the lumbosacral junction posteriorly. A necrotic malignancy is less likely.  A/P 1)Lumbar Area Abscess/Cellulitis/5.9 x 5.6 x 5.4 cm subcutaneous Abscess--as per patient lumbar area  cystic lesion/mass has been present for at least 2 years, it  got bigger over the last 6 months and then started draining just within the last 24 hours prior to admission Does Not meet Sepsis  --------IV vancomycin as per cellulitis order set, cefepime added -General surgery consult for possible intervention -Await culture data from swab obtained by ED provider -CT abdomen and pelvis demonstrating abscess as noted above  2) ESRD--- gets HD through left arm AV fistula Mondays Wednesdays and Fridays -Last HD session on 01/26/2020 -Nephrology consult for HD on 01/29/2020 requested  3)DM2-patient with diabetic nephropathy resume home insulin regimen Use Novolog/Humalog Sliding scale insulin with Accu-Cheks/Fingersticks as ordered   4)Anemia of ESRD--globin stable at 9.4 ESA/Procrit per nephrology team  5) chronic anticoagulation--- PTA patient was on apixaban 5 mg twice daily hold in case general surgeon decides to do open I&D  6)HTN--stable, okay to restart Amlodipine 10 mg daily along with Coreg 25 mg twice daily and Cardura 8 mg daily, also continue hydralazine 100 mg twice  daily and losartan 100 mg daily -May use IV labetalol as needed elevated BP  Disposition/Need for in-Hospital Stay- patient unable to be discharged at this time due to -- abscess requiring iv abx  Dispo: The patient is from: Home              Anticipated d/c is to: Home              Anticipated d/c date is: 1 day              Patient currently is not medically stable to d/c. Barriers: Not Clinically Stable- - abscess requiring iv abx   With History of - Reviewed by me  Past Medical History:  Diagnosis Date  . Anemia   . Anemia in chronic kidney disease 11/20/2015  . Cancer [facility]) 2009   Prostate; radiation seeds  . Chronic kidney disease    stage 4  . Diabetes mellitus   . Gout   . Hypertension       Past Surgical History:  Procedure Laterality Date  . A/V FISTULAGRAM Left 08/17/2017   Procedure: A/V FISTULAGRAM;  Surgeon: Katha Cabal, MD;  Location: [place] [facility];  Service: Cardiovascular;  Laterality: Left;  . A/V SHUNT INTERVENTION N/A 08/17/2017   Procedure: A/V SHUNT INTERVENTION;  Surgeon: Katha Cabal, MD;  Location: [place] [facility];  Service: Cardiovascular;  Laterality: N/A;  . AV FISTULA PLACEMENT Left 05/31/2015   Procedure: ARTERIOVENOUS (AV) FISTULA CREATION;  Surgeon: Katha Cabal, MD;  Location: [facility];  Service: Vascular;  Laterality: Left;  . excision bone spurs Bilateral 1989   feet  . KNEE SURGERY Left 1998   arthroscopy  . SHOULDER SURGERY Left 1994   rotator cuff    Chief Complaint  Patient presents with  . Abscess      HPI:    Justin [name]  is a 74 y.o. male reformed smoker with past medical history relevant for ESRD, getting HD on Mondays Wednesdays and Fridays, DM 2,HTN, anemia of ESRD, chronic anticoagulation with apixaban who presents to the ED with concerns about drainage from his left lower back wound---as per patient lumbar area  cystic lesion/mass has been present for at least 2 years, it  got bigger over the  last 6 months and then started draining just within the last 24 hours prior to admission  No Nausea, Vomiting or Diarrhea No fever  Or chills  -In the ED labs and imaging studies were obtained -WBC 18.3 with hemoglobin of 9.4, lactic acid 1.6 -Creatinine 10.718 at dialysis patient with a glucose of 257 bicarb of 29 and anion gap of 15 -Chest x-ray without acute findings CT AP IMPRESSION: -- 5.9 x 5.6 x 5.4 cm subcutaneous abscess at the level of the lumbosacral junction posteriorly. A necrotic malignancy is less Likely--  -EDP discussed case with on-call general surgeon, EDP did bedside I&D and sent wound and blood cultures awaiting full consult from general surgeon -EDP initiated antibiotics   Review of systems:    In addition to the HPI above,   A full Review of  Systems was done, all other systems reviewed are negative except as noted above in HPI , .    Social History:  Reviewed by me    Social History   Tobacco Use  . Smoking status: Former Smoker    Quit date: 10/01/1978    Years since quitting: 41.3  . Smokeless tobacco: Never Used  Substance Use Topics  . Alcohol use: No     Family History :  Reviewed by me   Family History  Problem Relation Age of Onset  . Kidney disease Mother   . Alcoholism Father   . Alcoholism Brother     Home Medications:   Prior to Admission medications   Medication Sig Start Date End Date Taking? Authorizing Provider  allopurinol (ZYLOPRIM) 300 MG tablet Take 300 mg by mouth daily. 10/13/19  Yes [provider]  atorvastatin (LIPITOR) 40 MG tablet Take 40 mg by mouth daily.  10/30/14  Yes [provider]  ELIQUIS 5 MG TABS tablet Take 5 mg by mouth 2 (two) times daily. 10/07/19  Yes [provider]  metoprolol tartrate (LOPRESSOR) 100 MG tablet Take 100 mg by mouth 2 (two) times daily. 11/25/19  Yes [provider]  omeprazole (PRILOSEC) 20 MG capsule Take 20 mg by mouth every morning.    Yes  [provider]     Allergies:    No Known Allergies   Physical Exam:   Vitals  Blood pressure 96/69, pulse 65, temperature 99.2 F (37.3 C), temperature source Oral, resp. rate 18, height 5\' 10"  (1.778 m), weight 104.3 kg, SpO2 100 %.  Physical Examination: General appearance - alert, well appearing, and in no distress Mental status - alert, oriented to person, place, and time,  Eyes - sclera anicteric Neck - supple, no JVD elevation , Chest - clear  to auscultation bilaterally, symmetrical air movement,  Heart - S1 and S2 normal, regular  Abdomen - soft, nontender, nondistended, no masses or organomegaly Neurological - screening mental status exam normal, neck supple without rigidity, cranial nerves II through XII intact, DTR's normal and symmetric Extremities - no pedal edema noted, intact peripheral pulses Skin - -mid lower back with rather large soft cystic mass draining somewhat serous fluid, there is tenderness, slight warmth, no significant erythema or streaking MSK- Lt arm AVF-with positive thrill and bruit    Data Review:    CBC Recent Labs  Lab 01/28/20 0957  WBC 18.3*  HGB 9.4*  HCT 30.8*  PLT 257  MCV 102.7*  MCH 31.3  MCHC 30.5  RDW 13.7  LYMPHSABS 1.5  MONOABS 1.8*  EOSABS 0.3  BASOSABS 0.1   ------------------------------------------------------------------------------------------------------------------  Chemistries  Recent Labs  Lab 01/28/20 0957  NA 137  K 3.9  CL 93*  CO2 29  GLUCOSE 277*  BUN 49*  CREATININE 10.78*  CALCIUM 8.4*  AST 20  ALT 14  ALKPHOS 122  BILITOT 0.6   ------------------------------------------------------------------------------------------------------------------ estimated creatinine clearance is 7.2 mL/min (A) (by C-G formula based on SCr of 10.78 mg/dL (H)). ------------------------------------------------------------------------------------------------------------------ No results for input(s):  TSH, T4TOTAL, T3FREE, THYROIDAB in the last 72 hours.  Invalid input(s): FREET3   Coagulation profile Recent Labs  Lab 01/28/20 0957  INR 1.3*   ------------------------------------------------------------------------------------------------------------------- No results for input(s): DDIMER in the last 72 hours. -------------------------------------------------------------------------------------------------------------------  Cardiac Enzymes No results for input(s): CKMB, TROPONINI, MYOGLOBIN in the last 168 hours.  Invalid input(s): CK ------------------------------------------------------------------------------------------------------------------ No results found for: BNP  Urinalysis    Component Value Date/Time   COLORURINE YELLOW 08/02/2010 [place] 08/02/2010 1534   LABSPEC 1.025 08/02/2010 1534   PHURINE 6.0 08/02/2010 1534   GLUCOSEU NEGATIVE 09/20/2009 1054   HGBUR MODERATE (A) 08/02/2010 1534   BILIRUBINUR NEGATIVE 08/02/2010 1534   KETONESUR NEGATIVE 08/02/2010 1534   PROTEINUR >300 (A) 08/02/2010 1534   UROBILINOGEN 0.2 08/02/2010 1534   NITRITE NEGATIVE 08/02/2010 1534   LEUKOCYTESUR NEGATIVE 08/02/2010 1534     Imaging Results:    DG Chest 2 View  Result Date: 01/28/2020 CLINICAL DATA:  Sepsis. EXAM: CHEST - 2 VIEW COMPARISON:  December 30, 2016. FINDINGS: Stable cardiomediastinal silhouette. No pneumothorax or pleural effusion is noted. Lungs are clear. Bony thorax is unremarkable. IMPRESSION: No active cardiopulmonary disease. Aortic Atherosclerosis (ICD10-I70.0). Electronically Signed   By: Marijo Conception M.D.   On: 01/28/2020 10:54   CT ABDOMEN PELVIS W CONTRAST  Result Date: 01/28/2020 CLINICAL DATA:  Large fungating mass overlying the lumbar spine on the skin for the past 6 months. Clinical concern for infection with abscess. The patient is on dialysis. EXAM: CT ABDOMEN AND PELVIS WITH CONTRAST TECHNIQUE: Multidetector CT imaging of  the abdomen and pelvis was performed using the standard protocol following bolus administration of intravenous contrast. CONTRAST:  159mL OMNIPAQUE IOHEXOL 300 MG/ML  SOLN COMPARISON:  None. FINDINGS: Lower chest: Atheromatous calcifications, including the coronary arteries and aorta. Hepatobiliary: No focal liver abnormality is seen. No gallstones, gallbladder wall thickening, or biliary dilatation. Pancreas: Unremarkable. No pancreatic ductal dilatation or surrounding inflammatory changes. Spleen: Normal in size without focal abnormality. Adrenals/Urinary Tract: Mild diffuse bilateral adrenal hyperplasia.  Small kidneys. Multiple small rounded low density masses in both kidneys. Some of these have fluid density, compatible with cysts. Others are slightly higher than fluid density. The largest of these is in the lower pole of the left kidney, measuring 1.2 cm in maximum diameter and 20 Hounsfield units in density on the delayed images through the kidneys (image number 22 series 7). Poorly distended bladder with mild diffuse wall thickening. Unremarkable ureters. No urinary tract calculi or hydronephrosis. Stomach/Bowel: Small hiatal hernia. Minimal sigmoid colon diverticulosis. Normal appearing appendix. Unremarkable small bowel. Vascular/Lymphatic: Atheromatous arterial calcifications without aneurysm. No enlarged lymph nodes. Reproductive: Moderate Lea enlarged prostate gland. Other: Rounded fluid and gas collection in the subcutaneous fat at the level of the lumbosacral junction, measuring 5.9 x 5.6 x 5.4 cm on image number 48 series 2 and sagittal image number 69. There is mild diffuse surrounding soft tissue thickening with overlying skin thickening. Small to moderate-sized umbilical hernia containing fat. 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 Lumbar and lower thoracic spine degenerative changes. IMPRESSION:  1. 5.9 x 5.6 x 5.4 cm subcutaneous abscess at the level of the lumbosacral junction posteriorly. A necrotic malignancy is less likely. 2. Moderate prostatic hypertrophy. 3. 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 4. Minimal sigmoid colon diverticulosis. 5. Small hiatal hernia. 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 Aortic Atherosclerosis (ICD10-I70.0). Electronically Signed   By: Claudie Revering M.D.   On: 01/28/2020 12:23    Radiological Exams on Admission: DG Chest 2 View  Result Date: 01/28/2020 CLINICAL DATA:  Sepsis. EXAM: CHEST - 2 VIEW COMPARISON:  December 30, 2016. FINDINGS: Stable cardiomediastinal silhouette. No pneumothorax or pleural effusion is noted. Lungs are clear. Bony thorax is unremarkable. IMPRESSION: No active cardiopulmonary disease. Aortic Atherosclerosis (ICD10-I70.0). Electronically Signed   By: Marijo Conception M.D.   On: 01/28/2020 10:54   CT ABDOMEN PELVIS W CONTRAST  Result Date: 01/28/2020 CLINICAL DATA:  Large fungating mass overlying the lumbar spine on the skin for the past 6 months. Clinical concern for infection with abscess. The patient is on dialysis. EXAM: CT ABDOMEN AND PELVIS WITH CONTRAST TECHNIQUE: Multidetector CT imaging of the abdomen and pelvis was performed using the standard protocol following bolus administration of intravenous contrast. CONTRAST:  152mL OMNIPAQUE IOHEXOL 300 MG/ML  SOLN COMPARISON:  None. FINDINGS: Lower chest: Atheromatous calcifications, including the coronary arteries and aorta. Hepatobiliary: No focal liver abnormality is seen. No gallstones, gallbladder wall thickening, or biliary dilatation. Pancreas: Unremarkable. No pancreatic ductal dilatation or surrounding inflammatory changes. Spleen: Normal in size without focal abnormality. Adrenals/Urinary Tract: Mild diffuse bilateral adrenal hyperplasia. Small kidneys. Multiple small rounded low  density masses in both kidneys. Some of these have fluid density, compatible with cysts. Others are slightly higher than fluid density. The largest of these is in the lower pole of the left kidney, measuring 1.2 cm in maximum diameter and 20 Hounsfield units in density on the delayed images through the kidneys (image number 22 series 7). Poorly distended bladder with mild diffuse wall thickening. Unremarkable ureters. No urinary tract calculi or hydronephrosis. Stomach/Bowel: Small hiatal hernia. Minimal sigmoid colon diverticulosis. Normal appearing appendix. Unremarkable small bowel. Vascular/Lymphatic: Atheromatous arterial calcifications without aneurysm. No enlarged lymph nodes. Reproductive: Moderate Lea enlarged prostate gland. Other: Rounded fluid and gas collection in the subcutaneous fat at the level of the lumbosacral junction, measuring 5.9 x 5.6 x 5.4 cm  on image number 48 series 2 and sagittal image number 69. There is mild diffuse surrounding soft tissue thickening with overlying skin thickening. Small to moderate-sized umbilical hernia containing fat. 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 Lumbar and lower thoracic spine degenerative changes. IMPRESSION: 1. 5.9 x 5.6 x 5.4 cm subcutaneous abscess at the level of the lumbosacral junction posteriorly. A necrotic malignancy is less likely. 2. Moderate prostatic hypertrophy. 3. 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 4. Minimal sigmoid colon diverticulosis. 5. Small hiatal hernia. 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 Aortic Atherosclerosis (ICD10-I70.0). Electronically Signed   By: Claudie Revering M.D.   On: 01/28/2020 12:23    DVT Prophylaxis -Eliquis currently on hold AM Labs Ordered, also please review Full Orders  Family Communication: Admission,  patients condition and plan of care including tests being ordered have been discussed with the patient  who indicate understanding and agree with the plan   Code Status - Full Code  Likely DC to  --Home after improvement in abscess/infection  Condition   stable  Roxan Hockey M.D on 01/28/2020 at 5:27 PM Go to [email or web address] -  for contact info  [facility] - Office  [phone]

## 2020-01-29 DIAGNOSIS — I10 Essential (primary) hypertension: Secondary | ICD-10-CM | POA: Diagnosis not present

## 2020-01-29 DIAGNOSIS — L02818 Cutaneous abscess of other sites: Secondary | ICD-10-CM | POA: Diagnosis present

## 2020-01-29 DIAGNOSIS — M4306 Spondylolysis, lumbar region: Secondary | ICD-10-CM | POA: Diagnosis present

## 2020-01-29 DIAGNOSIS — Z992 Dependence on renal dialysis: Secondary | ICD-10-CM | POA: Diagnosis not present

## 2020-01-29 DIAGNOSIS — K449 Diaphragmatic hernia without obstruction or gangrene: Secondary | ICD-10-CM | POA: Diagnosis present

## 2020-01-29 DIAGNOSIS — E785 Hyperlipidemia, unspecified: Secondary | ICD-10-CM | POA: Diagnosis present

## 2020-01-29 DIAGNOSIS — L089 Local infection of the skin and subcutaneous tissue, unspecified: Secondary | ICD-10-CM | POA: Diagnosis not present

## 2020-01-29 DIAGNOSIS — I4891 Unspecified atrial fibrillation: Secondary | ICD-10-CM | POA: Diagnosis present

## 2020-01-29 DIAGNOSIS — Z811 Family history of alcohol abuse and dependence: Secondary | ICD-10-CM | POA: Diagnosis not present

## 2020-01-29 DIAGNOSIS — N320 Bladder-neck obstruction: Secondary | ICD-10-CM | POA: Diagnosis present

## 2020-01-29 DIAGNOSIS — M545 Low back pain: Secondary | ICD-10-CM | POA: Diagnosis present

## 2020-01-29 DIAGNOSIS — L02212 Cutaneous abscess of back [any part, except buttock]: Secondary | ICD-10-CM | POA: Diagnosis present

## 2020-01-29 DIAGNOSIS — M109 Gout, unspecified: Secondary | ICD-10-CM | POA: Diagnosis present

## 2020-01-29 DIAGNOSIS — L0291 Cutaneous abscess, unspecified: Secondary | ICD-10-CM | POA: Diagnosis not present

## 2020-01-29 DIAGNOSIS — I480 Paroxysmal atrial fibrillation: Secondary | ICD-10-CM | POA: Diagnosis not present

## 2020-01-29 DIAGNOSIS — I361 Nonrheumatic tricuspid (valve) insufficiency: Secondary | ICD-10-CM | POA: Diagnosis not present

## 2020-01-29 DIAGNOSIS — D631 Anemia in chronic kidney disease: Secondary | ICD-10-CM | POA: Diagnosis present

## 2020-01-29 DIAGNOSIS — E1122 Type 2 diabetes mellitus with diabetic chronic kidney disease: Secondary | ICD-10-CM | POA: Diagnosis present

## 2020-01-29 DIAGNOSIS — I4892 Unspecified atrial flutter: Secondary | ICD-10-CM | POA: Diagnosis present

## 2020-01-29 DIAGNOSIS — Z20822 Contact with and (suspected) exposure to covid-19: Secondary | ICD-10-CM | POA: Diagnosis present

## 2020-01-29 DIAGNOSIS — I953 Hypotension of hemodialysis: Secondary | ICD-10-CM | POA: Diagnosis not present

## 2020-01-29 DIAGNOSIS — Z841 Family history of disorders of kidney and ureter: Secondary | ICD-10-CM | POA: Diagnosis not present

## 2020-01-29 DIAGNOSIS — N189 Chronic kidney disease, unspecified: Secondary | ICD-10-CM | POA: Diagnosis not present

## 2020-01-29 DIAGNOSIS — R011 Cardiac murmur, unspecified: Secondary | ICD-10-CM | POA: Diagnosis present

## 2020-01-29 DIAGNOSIS — Z7901 Long term (current) use of anticoagulants: Secondary | ICD-10-CM | POA: Diagnosis not present

## 2020-01-29 DIAGNOSIS — E113511 Type 2 diabetes mellitus with proliferative diabetic retinopathy with macular edema, right eye: Secondary | ICD-10-CM | POA: Diagnosis present

## 2020-01-29 DIAGNOSIS — H3521 Other non-diabetic proliferative retinopathy, right eye: Secondary | ICD-10-CM | POA: Diagnosis present

## 2020-01-29 DIAGNOSIS — N186 End stage renal disease: Secondary | ICD-10-CM | POA: Diagnosis present

## 2020-01-29 DIAGNOSIS — I483 Typical atrial flutter: Secondary | ICD-10-CM | POA: Diagnosis not present

## 2020-01-29 DIAGNOSIS — Z87891 Personal history of nicotine dependence: Secondary | ICD-10-CM | POA: Diagnosis not present

## 2020-01-29 DIAGNOSIS — I350 Nonrheumatic aortic (valve) stenosis: Secondary | ICD-10-CM | POA: Diagnosis present

## 2020-01-29 DIAGNOSIS — N400 Enlarged prostate without lower urinary tract symptoms: Secondary | ICD-10-CM | POA: Diagnosis present

## 2020-01-29 DIAGNOSIS — T148XXA Other injury of unspecified body region, initial encounter: Secondary | ICD-10-CM | POA: Diagnosis not present

## 2020-01-29 LAB — COMPREHENSIVE METABOLIC PANEL WITH GFR
ALT: 13 U/L (ref 0–44)
AST: 18 U/L (ref 15–41)
Albumin: 2.7 g/dL — ABNORMAL LOW (ref 3.5–5.0)
Alkaline Phosphatase: 96 U/L (ref 38–126)
Anion gap: 14 (ref 5–15)
BUN: 29 mg/dL — ABNORMAL HIGH (ref 8–23)
CO2: 27 mmol/L (ref 22–32)
Calcium: 8.2 mg/dL — ABNORMAL LOW (ref 8.9–10.3)
Chloride: 92 mmol/L — ABNORMAL LOW (ref 98–111)
Creatinine, Ser: 7.1 mg/dL — ABNORMAL HIGH (ref 0.61–1.24)
GFR calc Af Amer: 8 mL/min — ABNORMAL LOW
GFR calc non Af Amer: 7 mL/min — ABNORMAL LOW
Glucose, Bld: 252 mg/dL — ABNORMAL HIGH (ref 70–99)
Potassium: 4.3 mmol/L (ref 3.5–5.1)
Sodium: 133 mmol/L — ABNORMAL LOW (ref 135–145)
Total Bilirubin: 0.8 mg/dL (ref 0.3–1.2)
Total Protein: 7.5 g/dL (ref 6.5–8.1)

## 2020-01-29 LAB — CBC
HCT: 31 % — ABNORMAL LOW (ref 39.0–52.0)
HCT: 31.2 % — ABNORMAL LOW (ref 39.0–52.0)
Hemoglobin: 9.5 g/dL — ABNORMAL LOW (ref 13.0–17.0)
Hemoglobin: 9.6 g/dL — ABNORMAL LOW (ref 13.0–17.0)
MCH: 31.1 pg (ref 26.0–34.0)
MCH: 31.8 pg (ref 26.0–34.0)
MCHC: 30.6 g/dL (ref 30.0–36.0)
MCHC: 30.8 g/dL (ref 30.0–36.0)
MCV: 101.6 fL — ABNORMAL HIGH (ref 80.0–100.0)
MCV: 103.3 fL — ABNORMAL HIGH (ref 80.0–100.0)
Platelets: 287 10*3/uL (ref 150–400)
Platelets: 257 10*3/uL (ref 150–400)
RBC: 3.05 MIL/uL — ABNORMAL LOW (ref 4.22–5.81)
RBC: 3.02 MIL/uL — ABNORMAL LOW (ref 4.22–5.81)
RDW: 13.9 % (ref 11.5–15.5)
RDW: 13.9 % (ref 11.5–15.5)
WBC: 19.9 10*3/uL — ABNORMAL HIGH (ref 4.0–10.5)
WBC: 16.3 10*3/uL — ABNORMAL HIGH (ref 4.0–10.5)
nRBC: 0 % (ref 0.0–0.2)
nRBC: 0 % (ref 0.0–0.2)

## 2020-01-29 LAB — BASIC METABOLIC PANEL
Anion gap: 17 — ABNORMAL HIGH (ref 5–15)
BUN: 57 mg/dL — ABNORMAL HIGH (ref 8–23)
CO2: 28 mmol/L (ref 22–32)
Calcium: 8.2 mg/dL — ABNORMAL LOW (ref 8.9–10.3)
Chloride: 92 mmol/L — ABNORMAL LOW (ref 98–111)
Creatinine, Ser: 12.01 mg/dL — ABNORMAL HIGH (ref 0.61–1.24)
GFR calc Af Amer: 4 mL/min — ABNORMAL LOW (ref >60)
GFR calc non Af Amer: 4 mL/min — ABNORMAL LOW (ref >60)
Glucose, Bld: 251 mg/dL — ABNORMAL HIGH (ref 70–99)
Potassium: 4.3 mmol/L (ref 3.5–5.1)
Sodium: 137 mmol/L (ref 135–145)

## 2020-01-29 LAB — TSH: TSH: 0.837 u[IU]/mL (ref 0.350–4.500)

## 2020-01-29 LAB — GLUCOSE, CAPILLARY
Glucose-Capillary: 234 mg/dL — ABNORMAL HIGH (ref 70–99)
Glucose-Capillary: 251 mg/dL — ABNORMAL HIGH (ref 70–99)
Glucose-Capillary: 164 mg/dL — ABNORMAL HIGH (ref 70–99)
Glucose-Capillary: 266 mg/dL — ABNORMAL HIGH (ref 70–99)

## 2020-01-29 LAB — MAGNESIUM: Magnesium: 1.7 mg/dL (ref 1.7–2.4)

## 2020-01-29 LAB — MRSA PCR SCREENING: MRSA by PCR: NEGATIVE

## 2020-01-29 LAB — TROPONIN I (HIGH SENSITIVITY)
Troponin I (High Sensitivity): 13 ng/L
Troponin I (High Sensitivity): 12 ng/L

## 2020-01-29 MED ORDER — SODIUM CHLORIDE 0.9 % IV SOLN: 100.0000 mL | INTRAVENOUS | Status: DC | PRN

## 2020-01-29 MED ORDER — CHLORHEXIDINE GLUCONATE CLOTH 2 % EX PADS
6.0000 | MEDICATED_PAD | Freq: Every day | CUTANEOUS | Status: DC
  Administered 2020-01-29 – 2020-02-01 (×3): 6 via TOPICAL

## 2020-01-29 MED ORDER — DOXERCALCIFEROL 4 MCG/2ML IV SOLN
12.0000 ug | INTRAVENOUS | Status: DC
  Administered 2020-01-29 – 2020-01-31 (×2): 12 ug via INTRAVENOUS
  Filled 2020-01-29 (×2): qty 6

## 2020-01-29 MED ORDER — COLLAGENASE 250 UNIT/GM EX OINT
TOPICAL_OINTMENT | CUTANEOUS | Status: DC
  Filled 2020-01-29 (×2): qty 30

## 2020-01-29 MED ORDER — LIDOCAINE HCL (PF) 1 % IJ SOLN: 5.0000 mL | INTRAMUSCULAR | Status: DC | PRN

## 2020-01-29 MED ORDER — METOPROLOL TARTRATE 5 MG/5ML IV SOLN: 5.0000 mg | Freq: Once | INTRAVENOUS | Status: AC

## 2020-01-29 MED ORDER — PENTAFLUOROPROP-TETRAFLUOROETH EX AERO: 1.0000 "application " | INHALATION_SPRAY | CUTANEOUS | Status: DC | PRN

## 2020-01-29 MED ORDER — DILTIAZEM HCL-DEXTROSE 125-5 MG/125ML-% IV SOLN (PREMIX)
5.0000 mg/h | INTRAVENOUS | Status: DC
  Filled 2020-01-29: qty 125

## 2020-01-29 MED ORDER — TRAMADOL HCL 50 MG PO TABS: 100.0000 mg | ORAL_TABLET | Freq: Four times a day (QID) | ORAL | Status: DC | PRN

## 2020-01-29 MED ORDER — LIDOCAINE-PRILOCAINE 2.5-2.5 % EX CREA: 1.0000 "application " | TOPICAL_CREAM | CUTANEOUS | Status: DC | PRN

## 2020-01-29 MED ORDER — APIXABAN 2.5 MG PO TABS
2.5000 mg | ORAL_TABLET | Freq: Two times a day (BID) | ORAL | Status: DC
  Administered 2020-01-29 – 2020-01-30 (×2): 2.5 mg via ORAL
  Filled 2020-01-29 (×2): qty 1

## 2020-01-29 MED ORDER — METOPROLOL TARTRATE 5 MG/5ML IV SOLN
INTRAVENOUS | Status: AC
  Administered 2020-01-29: 5 mg
  Filled 2020-01-29: qty 5

## 2020-01-29 MED ORDER — SODIUM CHLORIDE 0.9 % IV SOLN
100.0000 mL | INTRAVENOUS | Status: DC | PRN
  Administered 2020-01-29 – 2020-01-30 (×3): 100 mL via INTRAVENOUS

## 2020-01-29 MED ORDER — DIGOXIN 0.25 MG/ML IJ SOLN
0.1250 mg | Freq: Every day | INTRAMUSCULAR | Status: DC
  Administered 2020-01-29: 0.125 mg via INTRAVENOUS
  Filled 2020-01-29: qty 2

## 2020-01-29 MED ORDER — OXYCODONE HCL 5 MG PO TABS: 5.0000 mg | ORAL_TABLET | ORAL | Status: DC | PRN

## 2020-01-29 NOTE — Consult Note (Signed)
[place] Admit Date: 01/28/2020 01/29/2020 Rexene Agent Requesting Physician:  Denton Brick MD  Reason for Consult:  ESRD comanagement HPI:  38M ESRD MWF on iHD in [place] via LUE AVF for past 3.5y presented yesterday to the emergency department with an enlarging and draining wound in the lower back.  CT was consistent with a subcutaneous abscess, and patient underwent incision and drainage in the ER.  General surgery has been consulted for further management.  He was placed on vancomycin and cefepime.  Labs are consistent with his known ESRD.  Potassium 4.3 and bicarbonate of 28.  Hemoglobin is 9.5.  He has a leukocytosis, today it is 19.9 and yesterday was 18.3.  He is due for dialysis today.  We do not have outpatient records.  He reports no recent issues with dialysis.  His left arm AV fistula has been working well.  Creatinine, Ser (mg/dL)  Date Value  01/29/2020 12.01 (H)  01/28/2020 10.78 (H)  08/03/2016 5.36 (H)  10/16/2015 4.61 (H)  05/24/2015 4.12 (H)  02/28/2015 4.37 (H)  07/11/2011 2.12 (H)  07/10/2011 2.13 (H)  08/02/2010 1.7 (H)  09/22/2009 1.58 (H)  ]  ROS Balance of 12 systems is negative w/ exceptions as above  PMH  Past Medical History:  Diagnosis Date  . Anemia   . Anemia in chronic kidney disease 11/20/2015  . Cancer [facility]) 2009   Prostate; radiation seeds  . Chronic kidney disease    stage 4  . Diabetes mellitus   . Gout   . Hypertension    PSH  Past Surgical History:  Procedure Laterality Date  . A/V FISTULAGRAM Left 08/17/2017   Procedure: A/V FISTULAGRAM;  Surgeon: Katha Cabal, MD;  Location: [place] [facility];  Service: Cardiovascular;  Laterality: Left;  . A/V SHUNT INTERVENTION N/A 08/17/2017   Procedure: A/V SHUNT INTERVENTION;  Surgeon: Katha Cabal, MD;  Location: [place] [facility];  Service: Cardiovascular;  Laterality: N/A;  . AV FISTULA PLACEMENT Left 05/31/2015   Procedure: ARTERIOVENOUS (AV) FISTULA CREATION;   Surgeon: Katha Cabal, MD;  Location: [facility];  Service: Vascular;  Laterality: Left;  . excision bone spurs Bilateral 1989   feet  . KNEE SURGERY Left 1998   arthroscopy  . SHOULDER SURGERY Left 1994   rotator cuff   FH  Family History  Problem Relation Age of Onset  . Kidney disease Mother   . Alcoholism Father   . Alcoholism Brother    [place]  reports that he quit smoking about 41 years ago. He has never used smokeless tobacco. He reports that he does not drink alcohol and does not use drugs. Allergies No Known Allergies Home medications Prior to Admission medications   Medication Sig Start Date End Date Taking? Authorizing Provider  allopurinol (ZYLOPRIM) 300 MG tablet Take 300 mg by mouth daily. 10/13/19  Yes [provider]  atorvastatin (LIPITOR) 40 MG tablet Take 40 mg by mouth daily.  10/30/14  Yes [provider]  ELIQUIS 5 MG TABS tablet Take 5 mg by mouth 2 (two) times daily. 10/07/19  Yes [provider]  metoprolol tartrate (LOPRESSOR) 100 MG tablet Take 100 mg by mouth 2 (two) times daily. 11/25/19  Yes [provider]  omeprazole (PRILOSEC) 20 MG capsule Take 20 mg by mouth every morning.    Yes [provider]    Current Medications Scheduled Meds: . allopurinol  100 mg Oral BH-q7a  . allopurinol  300 mg Oral Daily  .  amLODipine  10 mg Oral Daily  . apixaban  5 mg Oral BID  . aspirin EC  81 mg Oral BH-q7a  . atorvastatin  40 mg Oral Daily  . carvedilol  25 mg Oral BID WC  . doxazosin  8 mg Oral QHS  . hydrALAZINE  100 mg Oral BID  . insulin aspart  0-5 Units Subcutaneous QHS  . insulin aspart  0-6 Units Subcutaneous TID WC  . insulin aspart  2 Units Subcutaneous TID WC  . insulin glargine  10 Units Subcutaneous QHS  . lidocaine (PF)  5 mL Other Once  . losartan  100 mg Oral Daily  . multivitamin with minerals  1 tablet Oral Daily  . ofloxacin  1 drop Right Eye QID  . pantoprazole  40 mg Oral Daily  . potassium  chloride  10 mEq Oral Daily  . prednisoLONE acetate  1 drop Right Eye QID  . sodium chloride flush  3 mL Intravenous Q12H  . sucroferric oxyhydroxide  500 mg Oral TID  . torsemide  20 mg Oral BID   Continuous Infusions: . sodium chloride    . ceFEPime (MAXIPIME) IV Stopped (01/28/20 1259)  . vancomycin     PRN Meds:.sodium chloride, acetaminophen **OR** acetaminophen, albuterol, cyclobenzaprine, HYDROcodone-acetaminophen, HYDROmorphone (DILAUDID) injection, labetalol, ondansetron **OR** ondansetron (ZOFRAN) IV, oxyCODONE, polyethylene glycol, sodium chloride flush, traMADol, traZODone  CBC Recent Labs  Lab 01/28/20 0957 01/29/20 0616  WBC 18.3* 19.9*  NEUTROABS 14.5*  --   HGB 9.4* 9.5*  HCT 30.8* 31.0*  MCV 102.7* 101.6*  PLT 257 158   Basic Metabolic Panel Recent Labs  Lab 01/28/20 0957 01/29/20 0616  NA 137 137  K 3.9 4.3  CL 93* 92*  CO2 29 28  GLUCOSE 277* 251*  BUN 49* 57*  CREATININE 10.78* 12.01*  CALCIUM 8.4* 8.2*    Physical Exam  Blood pressure 100/66, pulse (!) 127, temperature 98.6 F (37 C), temperature source Oral, resp. rate 18, height 5\' 10"  (1.778 m), weight 105.4 kg, SpO2 98 %. GEN: NAD, sitting on edge of bed.  ENT: NCAT EYES: EOMI CV: tachy,  Cont murmur, nl s1s2 PULM: CTAB nl s1s2 ABD: s/nt/nd  SKIN: bandaged lower back EXT: Trace LEE NEURO: Nonfocal  Assessment 73 ESRD MWF [place] via LUE AVF  1. ESRD LUE AVF MWF 2. Subcutaneous abscess: Vanc/Cefepime, drained.  Gen surg to see 3. Anemia, stable in 9s 4. CKD0-BMD: Ca ok 5. Tachycardia, ? Related #2, per primary; EKG yest ST 6. HTN: BP stable  Plan 1. HD today: 2K, 3.5h, 2-3L UF, No heparin 2. Will follow along 3. Daily weights, Daily Renal Panel, Strict I/Os, Avoid nephrotoxins (NSAIDs, judicious IV Contrast)    Rexene Agent  01/29/2020, 8:50 AM

## 2020-01-29 NOTE — Consult Note (Signed)
Reason for Consult: Abscess, back Referring Physician: Dr. Baldo Daub is an 74 y.o. male.  HPI: Patient is a 74 year old black male who presented to the emergency room with worsening pain and swelling in the lower back.  He is found to have a subcutaneous abscess.  Patient states he has had a lump present in that area for many months.  It started draining white creamy fluid earlier.  It is tender to touch.  Patient was admitted to the hospital for wound care and IV antibiotics.  Does have end-stage renal disease.  Past Medical History:  Diagnosis Date  . Anemia   . Anemia in chronic kidney disease 11/20/2015  . Cancer [facility]) 2009   Prostate; radiation seeds  . Chronic kidney disease    stage 4  . Diabetes mellitus   . Gout   . Hypertension     Past Surgical History:  Procedure Laterality Date  . A/V FISTULAGRAM Left 08/17/2017   Procedure: A/V FISTULAGRAM;  Surgeon: Katha Cabal, MD;  Location: [place] [facility];  Service: Cardiovascular;  Laterality: Left;  . A/V SHUNT INTERVENTION N/A 08/17/2017   Procedure: A/V SHUNT INTERVENTION;  Surgeon: Katha Cabal, MD;  Location: [place] [facility];  Service: Cardiovascular;  Laterality: N/A;  . AV FISTULA PLACEMENT Left 05/31/2015   Procedure: ARTERIOVENOUS (AV) FISTULA CREATION;  Surgeon: Katha Cabal, MD;  Location: [facility];  Service: Vascular;  Laterality: Left;  . excision bone spurs Bilateral 1989   feet  . KNEE SURGERY Left 1998   arthroscopy  . SHOULDER SURGERY Left 1994   rotator cuff    Family History  Problem Relation Age of Onset  . Kidney disease Mother   . Alcoholism Father   . Alcoholism Brother     Social History:  reports that he quit smoking about 41 years ago. He has never used smokeless tobacco. He reports that he does not drink alcohol and does not use drugs.  Allergies: No Known Allergies  Medications: I have reviewed the patient's current medications.  Results for orders  placed or performed during the hospital encounter of 01/28/20 (from the past 48 hour(s))  Hemoglobin A1c     Status: Abnormal   Collection Time: 01/28/20  9:51 AM  Result Value Ref Range   Hgb A1c MFr Bld 6.1 (H) 4.8 - 5.6 %    Comment: (NOTE) Pre diabetes:          5.7%-6.4%  Diabetes:              >6.4%  Glycemic control for   <7.0% adults with diabetes    Mean Plasma Glucose 128.37 mg/dL    Comment: Performed at [facility], [place] [address], [place], [place] [address]  Comprehensive metabolic panel     Status: Abnormal   Collection Time: 01/28/20  9:57 AM  Result Value Ref Range   Sodium 137 135 - 145 mmol/L   Potassium 3.9 3.5 - 5.1 mmol/L   Chloride 93 (L) 98 - 111 mmol/L   CO2 29 22 - 32 mmol/L   Glucose, Bld 277 (H) 70 - 99 mg/dL    Comment: Glucose reference range applies only to samples taken after fasting for at least 8 hours.   BUN 49 (H) 8 - 23 mg/dL   Creatinine, Ser 10.78 (H) 0.61 - 1.24 mg/dL   Calcium 8.4 (L) 8.9 - 10.3 mg/dL   Total Protein 7.7 6.5 - 8.1 g/dL   Albumin  3.0 (L) 3.5 - 5.0 g/dL   AST 20 15 - 41 U/L   ALT 14 0 - 44 U/L   Alkaline Phosphatase 122 38 - 126 U/L   Total Bilirubin 0.6 0.3 - 1.2 mg/dL   GFR calc non Af Amer 4 (L) >60 mL/min   GFR calc Af Amer 5 (L) >60 mL/min   Anion gap 15 5 - 15    Comment: Performed at [facility], [address] [place], [place] [address]  Lactic acid, plasma     Status: None   Collection Time: 01/28/20  9:57 AM  Result Value Ref Range   Lactic Acid, Venous 1.6 0.5 - 1.9 mmol/L    Comment: Performed at [facility], [address] [place], [place] [address]  CBC with Differential     Status: Abnormal   Collection Time: 01/28/20  9:57 AM  Result Value Ref Range   WBC 18.3 (H) 4.0 - 10.5 K/uL   RBC 3.00 (L) 4.22 - 5.81 MIL/uL   Hemoglobin 9.4 (L) 13.0 - 17.0 g/dL   HCT 30.8 (L) 39 - 52 %   MCV 102.7 (H) 80.0 - 100.0 fL   MCH 31.3 26.0 - 34.0 pg   MCHC 30.5 30.0 - 36.0 g/dL   RDW 13.7 11.5 - 15.5 %    Platelets 257 150 - 400 K/uL   nRBC 0.0 0.0 - 0.2 %   Neutrophils Relative % 80 %   Neutro Abs 14.5 (H) 1.7 - 7.7 K/uL   Lymphocytes Relative 8 %   Lymphs Abs 1.5 0.7 - 4.0 K/uL   Monocytes Relative 10 %   Monocytes Absolute 1.8 (H) 0 - 1 K/uL   Eosinophils Relative 1 %   Eosinophils Absolute 0.3 0 - 0 K/uL   Basophils Relative 0 %   Basophils Absolute 0.1 0 - 0 K/uL   Immature Granulocytes 1 %   Abs Immature Granulocytes 0.17 (H) 0.00 - 0.07 K/uL    Comment: Performed at [facility], [address], [place], [place] [address]  Protime-INR     Status: Abnormal   Collection Time: 01/28/20  9:57 AM  Result Value Ref Range   Prothrombin Time 16.1 (H) 11.4 - 15.2 seconds   INR 1.3 (H) 0.8 - 1.2    Comment: (NOTE) INR goal varies based on device and disease states. Performed at [facility], [address] [place], [place] [address]   Culture, blood (Routine x 2)     Status: None (Preliminary result)   Collection Time: 01/28/20  9:57 AM   Specimen: Right Antecubital; Blood  Result Value Ref Range   Specimen Description      RIGHT ANTECUBITAL BOTTLES DRAWN AEROBIC AND ANAEROBIC   Special Requests Blood Culture adequate volume    Culture      NO GROWTH < 24 HOURS Performed at [facility], [address] [place], [place] [address]    Report Status PENDING   Culture, blood (Routine x 2)     Status: None (Preliminary result)   Collection Time: 01/28/20  9:57 AM   Specimen: BLOOD RIGHT HAND  Result Value Ref Range   Specimen Description BLOOD RIGHT HAND AEROBIC BOTTLE ONLY    Special Requests      Blood Culture results may not be optimal due to an inadequate volume of blood received in culture bottles   Culture      NO GROWTH < 24 HOURS Performed at [facility], [address] [place],  [address]  Report Status PENDING   APTT     Status: Abnormal   Collection Time: 01/28/20  9:57 AM  Result Value Ref Range   aPTT 41 (H) 24 - 36 seconds    Comment:        IF  BASELINE aPTT IS ELEVATED, SUGGEST PATIENT RISK ASSESSMENT BE USED TO DETERMINE APPROPRIATE ANTICOAGULANT THERAPY. Performed at [facility], [address] [place], [place] [address]   Lactic acid, plasma     Status: None   Collection Time: 01/28/20 12:12 PM  Result Value Ref Range   Lactic Acid, Venous 1.8 0.5 - 1.9 mmol/L    Comment: Performed at [facility], [address] [place], [place] [address]  Aerobic/Anaerobic Culture (surgical/deep wound)     Status: None (Preliminary result)   Collection Time: 01/28/20 12:49 PM   Specimen: Back; Abscess  Result Value Ref Range   Specimen Description      BACK Performed at [facility], [address], [place], [place] [address]    Special Requests      Normal Performed at [place] [place], [place] [address]    Gram Stain      FEW WBC PRESENT, PREDOMINANTLY PMN RARE GRAM POSITIVE COCCI IN PAIRS RARE GRAM POSITIVE RODS    Culture      NO GROWTH < 24 HOURS Performed at [facility] [address], [place], [place] [address]    Report Status PENDING   SARS Coronavirus 2 by RT PCR (hospital order, performed in [facility] hospital lab) Nasopharyngeal Nasopharyngeal Swab     Status: None   Collection Time: 01/28/20  1:53 PM   Specimen: Nasopharyngeal Swab  Result Value Ref Range   SARS Coronavirus 2 NEGATIVE NEGATIVE    Comment: (NOTE) SARS-CoV-2 target nucleic acids are NOT DETECTED.  The SARS-CoV-2 RNA is generally detectable in upper and lower respiratory specimens during the acute phase of infection. The lowest concentration of SARS-CoV-2 viral copies this assay can detect is 250 copies / mL. A negative result does not preclude SARS-CoV-2 infection and should not be used as the sole basis for treatment or other patient management decisions.  A negative result may occur with improper specimen collection / handling, submission of specimen other than nasopharyngeal swab, presence of viral mutation(s)  within the areas targeted by this assay, and inadequate number of viral copies (<250 copies / mL). A negative result must be combined with clinical observations, patient history, and epidemiological information.  Fact Sheet for Patients:   StrictlyIdeas.no  Fact Sheet for Healthcare Providers: BankingDealers.co.za  This test is not yet approved or  cleared by the [place] FDA and has been authorized for detection and/or diagnosis of SARS-CoV-2 by FDA under an Emergency Use Authorization (EUA).  This EUA will remain in effect (meaning this test can be used) for the duration of the COVID-19 declaration under Section 564(b)(1) of the Act, 21 U.S.C. section 360bbb-3(b)(1), unless the authorization is terminated or revoked sooner.  Performed at [facility], [address] [place], [place] [address]   Glucose, capillary     Status: Abnormal   Collection Time: 01/28/20  5:35 PM  Result Value Ref Range   Glucose-Capillary 199 (H) 70 - 99 mg/dL    Comment: Glucose reference range applies only to samples taken after fasting for at least 8 hours.  Glucose, capillary     Status: Abnormal   Collection Time: 01/28/20  9:15 PM  Result Value Ref Range  Glucose-Capillary 275 (H) 70 - 99 mg/dL    Comment: Glucose reference range applies only to samples taken after fasting for at least 8 hours.   Comment 1 Notify RN    Comment 2 Document in Chart   Basic metabolic panel     Status: Abnormal   Collection Time: 01/29/20  6:16 AM  Result Value Ref Range   Sodium 137 135 - 145 mmol/L   Potassium 4.3 3.5 - 5.1 mmol/L   Chloride 92 (L) 98 - 111 mmol/L   CO2 28 22 - 32 mmol/L   Glucose, Bld 251 (H) 70 - 99 mg/dL    Comment: Glucose reference range applies only to samples taken after fasting for at least 8 hours.   BUN 57 (H) 8 - 23 mg/dL   Creatinine, Ser 12.01 (H) 0.61 - 1.24 mg/dL   Calcium 8.2 (L) 8.9 - 10.3 mg/dL   GFR calc non Af Amer 4 (L)  >60 mL/min   GFR calc Af Amer 4 (L) >60 mL/min   Anion gap 17 (H) 5 - 15    Comment: Performed at [facility], [address] [place] [address] [place], [place] [address]  CBC     Status: Abnormal   Collection Time: 01/29/20  6:16 AM  Result Value Ref Range   WBC 19.9 (H) 4.0 - 10.5 K/uL   RBC 3.05 (L) 4.22 - 5.81 MIL/uL   Hemoglobin 9.5 (L) 13.0 - 17.0 g/dL   HCT 31.0 (L) 39 - 52 %   MCV 101.6 (H) 80.0 - 100.0 fL   MCH 31.1 26.0 - 34.0 pg   MCHC 30.6 30.0 - 36.0 g/dL   RDW 13.9 11.5 - 15.5 %   Platelets 287 150 - 400 K/uL   nRBC 0.0 0.0 - 0.2 %    Comment: Performed at [facility], [address] [place], [place] [address]  Glucose, capillary     Status: Abnormal   Collection Time: 01/29/20  7:15 AM  Result Value Ref Range   Glucose-Capillary 234 (H) 70 - 99 mg/dL    Comment: Glucose reference range applies only to samples taken after fasting for at least 8 hours.  Glucose, capillary     Status: Abnormal   Collection Time: 01/29/20 11:16 AM  Result Value Ref Range   Glucose-Capillary 251 (H) 70 - 99 mg/dL    Comment: Glucose reference range applies only to samples taken after fasting for at least 8 hours.  Glucose, capillary     Status: Abnormal   Collection Time: 01/29/20  4:35 PM  Result Value Ref Range   Glucose-Capillary 164 (H) 70 - 99 mg/dL    Comment: Glucose reference range applies only to samples taken after fasting for at least 8 hours.   Comment 1 Notify RN    Comment 2 Document in Chart     DG Chest 2 View  Result Date: 01/28/2020 CLINICAL DATA:  Sepsis. EXAM: CHEST - 2 VIEW COMPARISON:  December 30, 2016. FINDINGS: Stable cardiomediastinal silhouette. No pneumothorax or pleural effusion is noted. Lungs are clear. Bony thorax is unremarkable. IMPRESSION: No active cardiopulmonary disease. Aortic Atherosclerosis (ICD10-I70.0). Electronically Signed   By: Marijo Conception M.D.   On: 01/28/2020 10:54   CT ABDOMEN PELVIS W CONTRAST  Result Date: 01/28/2020 CLINICAL DATA:  Large  fungating mass overlying the lumbar spine on the skin for the past 6 months. Clinical concern for infection with abscess. The patient is on dialysis. EXAM: CT ABDOMEN AND PELVIS WITH CONTRAST TECHNIQUE:  Multidetector CT imaging of the abdomen and pelvis was performed using the standard protocol following bolus administration of intravenous contrast. CONTRAST:  134mL OMNIPAQUE IOHEXOL 300 MG/ML  SOLN COMPARISON:  None. FINDINGS: Lower chest: Atheromatous calcifications, including the coronary arteries and aorta. Hepatobiliary: No focal liver abnormality is seen. No gallstones, gallbladder wall thickening, or biliary dilatation. Pancreas: Unremarkable. No pancreatic ductal dilatation or surrounding inflammatory changes. Spleen: Normal in size without focal abnormality. Adrenals/Urinary Tract: Mild diffuse bilateral adrenal hyperplasia. Small kidneys. Multiple small rounded low density masses in both kidneys. Some of these have fluid density, compatible with cysts. Others are slightly higher than fluid density. The largest of these is in the lower pole of the left kidney, measuring 1.2 cm in maximum diameter and 20 Hounsfield units in density on the delayed images through the kidneys (image number 22 series 7). Poorly distended bladder with mild diffuse wall thickening. Unremarkable ureters. No urinary tract calculi or hydronephrosis. Stomach/Bowel: Small hiatal hernia. Minimal sigmoid colon diverticulosis. Normal appearing appendix. Unremarkable small bowel. Vascular/Lymphatic: Atheromatous arterial calcifications without aneurysm. No enlarged lymph nodes. Reproductive: Moderate Lea enlarged prostate gland. Other: Rounded fluid and gas collection in the subcutaneous fat at the level of the lumbosacral junction, measuring 5.9 x 5.6 x 5.4 cm on image number 48 series 2 and sagittal image number 69. There is mild diffuse surrounding soft tissue thickening with overlying skin thickening. Small to moderate-sized umbilical  hernia containing fat. 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 Lumbar and lower thoracic spine degenerative changes. IMPRESSION: 1. 5.9 x 5.6 x 5.4 cm subcutaneous abscess at the level of the lumbosacral junction posteriorly. A necrotic malignancy is less likely. 2. Moderate prostatic hypertrophy. 3. 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 4. Minimal sigmoid colon diverticulosis. 5. Small hiatal hernia. 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 Aortic Atherosclerosis (ICD10-I70.0). Electronically Signed   By: Claudie Revering M.D.   On: 01/28/2020 12:23    ROS:  Pertinent items are noted in HPI.  Blood pressure 118/72, pulse (!) 124, temperature 98.4 F (36.9 C), temperature source Oral, resp. rate 16, height 5\' 10"  (1.778 m), weight 104 kg, SpO2 99 %. Physical Exam: Pleasant black male in no acute distress Head is normocephalic, atraumatic Lungs clear to auscultation with good breath sounds bilaterally Heart examination reveals regular rate and rhythm without S3, S4, murmurs Back examination reveals a 5 to 6 cm excoriated area in the lower mid back with multiple pustules draining white fluid.  There is also a thin eschar over the surface. CT scan results reviewed  Assessment/Plan: Impression: Abscess, back Plan: Patient was just taken off his Eliquis.  Will start Santyl to remove the eschar.  Would continue IV antibiotics.  Initial cultures show both gram-positive cocci and rods.  Further ID is pending.  Wound care orders have been placed.  Will follow with you.  Aviva Signs 01/29/2020, 5:16 PM

## 2020-01-29 NOTE — Procedures (Signed)
     HEMODIALYSIS TREATMENT NOTE:   Uneventful 3.5 hour heparin-free HD completed via LUA AVF (15g/antegrade).   Outpatient records obtained from [facility]: MWF 3.5 hours 2K / 2.5Ca 450/800 Optiflux 180NR EDW 104kg  Sensipar 120mg  q HD Hectorol 12mg  q HD Micera 53mcg q 2w - last given 01/19/20   Goal lowered to keep SBP >100 as ordered.  Net UF 1.7L.  UF was interrupted x 30 minutes. All blood was returned and hemostasis was achieved in 15 minutes.   No changes from pre-HD assessment.  Post weight 104kg.     Rockwell Alexandria, RN

## 2020-01-29 NOTE — Care Management Obs Status (Signed)
[facility] NOTIFICATION   Patient Details  Name: Justin [name] MRN: [id number] Date of Birth: 12/04/1945   Medicare Observation Status Notification Given:  Yes    [name] 01/29/2020, 3:49 PM

## 2020-01-29 NOTE — Progress Notes (Signed)
Patient Demographics:    Justin [name], is a 74 y.o. male, DOB - [birth year], ZES:[id number]  Admit date - 01/28/2020   Admitting Physician Levorn Oleski Denton Brick, MD  Outpatient Primary MD for the patient is [name], Justin Inch, MD  LOS - 0   Chief Complaint  Patient presents with  . Abscess        Subjective:    Justin [name] today has no fevers, no emesis,  No chest pain,   --Developed dizziness, his blood pressure dropped, he was found to be in atrial flutter with heart rate in the 130s, received IV metoprolol, tachycardia persisted, was transferred to stepdown and started on IV Cardizem for rate control  Assessment  & Plan :    Principal Problem:   Wound infection Active Problems:   ESRD on dialysis [facility])   Atrial flutter (HCC)   DM (diabetes mellitus) ([place])   HTN (hypertension)   Anemia in chronic kidney disease   Abscess  Brief Summary:-  74 y.o. male reformed smoker with past medical history relevant for ESRD, getting HD on Mondays Wednesdays and Fridays, DM 2,HTN, anemia of ESRD, chronic anticoagulation with apixaban admitted on 01/28/2020 with lumbar area abscess/cellulitis, subsequently patient developed atrial flutter with hypotension after completing hemodialysis on 01/29/2020  A/p 1) atrial flutter with RVR and hemodynamic concerns--patient developed tachycardia after hemodialysis, received IV metoprolol tachycardia persisted, transferred to stepdown unit for IV Cardizem -We have ordered serum magnesium, SH, troponin CBC and CMP -Okay to restart Eliquis for anticoagulation -Get echocardiogram -Blood pressure soft, hold torsemide, hold amlodipine, hold hydralazine and hold losartan in order to allow for titration of Cardizem and metoprolol for rate control in the setting of atrial flutter with RVR  2)Lumbar Area Abscess/Cellulitis/5.9 x 5.6 x 5.4 cm subcutaneous Abscess--as per patient  lumbar area  cystic lesion/mass has been present for at least 2 years, it  got bigger over the last 6 months and then started draining just within the last 24 hours prior to admission Does Not meet Sepsis  --------IV vancomycin as per cellulitis order set, cefepime added -General surgery consult for possible intervention -Await culture data from swab obtained by ED provider--  --preliminary Gram stain/culture with- Gram-positive cocci and rods -CT abdomen and pelvis demonstrating abscess as noted    3) ESRD--- gets HD through left arm AV fistula Mondays Wednesdays and Fridays -Last HD session on 01/29/2020 -Nephrology consult appreciated  4)DM2-patient with diabetic nephropathy resume home insulin regimen Use Novolog/Humalog Sliding scale insulin with Accu-Cheks/Fingersticks as ordered   4)Anemia of ESRD--Hgb stable at 9.4 ESA/Procrit per nephrology team  5)Chronic anticoagulation--- PTA patient was on apixaban 5 mg twice daily hold in case general surgeon decides to do open I&D -Apixaban adjusted to 2.5 mg twice daily for renal function  6)HTN--stable, -Blood pressure soft, hold torsemide, hold amlodipine, hold hydralazine and hold losartan in order to allow for titration of Cardizem and metoprolol for rate control in the setting of atrial flutter with RVR, c/n Coreg 25 mg twice daily     Disposition/Need for in-Hospital Stay- patient unable to be discharged at this time due to --atrial flutter requiring IV Cardizem for rate control with soft BP, lumbar area abscess requiring iv abx  Dispo: The patient is from: Home  Anticipated d/c is to: Home  Anticipated d/c date is: 1 day  Patient currently is not medically stable to d/c. Barriers: Not Clinically Stable- - aatrial flutter requiring IV Cardizem for rate control with soft BP, lumbar area abscess requiring iv abx   Code Status : full  Family Communication:   (patient is alert, awake and  coherent)   Consults  :  gen surge/Nephrology/cardiio  DVT Prophylaxis  : Eliquis Lab Results  Component Value Date   PLT 287 01/29/2020    Inpatient Medications  Scheduled Meds: . allopurinol  100 mg Oral BH-q7a  . allopurinol  300 mg Oral Daily  . apixaban  2.5 mg Oral BID  . aspirin EC  81 mg Oral BH-q7a  . atorvastatin  40 mg Oral Daily  . carvedilol  25 mg Oral BID WC  . Chlorhexidine Gluconate Cloth  6 each Topical Daily  . collagenase   Topical UD  . doxazosin  8 mg Oral QHS  . doxercalciferol  12 mcg Intravenous Q M,W,F-HD  . insulin aspart  0-5 Units Subcutaneous QHS  . insulin aspart  0-6 Units Subcutaneous TID WC  . insulin aspart  2 Units Subcutaneous TID WC  . insulin glargine  10 Units Subcutaneous QHS  . lidocaine (PF)  5 mL Other Once  . multivitamin with minerals  1 tablet Oral Daily  . ofloxacin  1 drop Right Eye QID  . pantoprazole  40 mg Oral Daily  . potassium chloride  10 mEq Oral Daily  . prednisoLONE acetate  1 drop Right Eye QID  . sodium chloride flush  3 mL Intravenous Q12H  . sucroferric oxyhydroxide  500 mg Oral TID   Continuous Infusions: . sodium chloride    . sodium chloride    . sodium chloride    . ceFEPime (MAXIPIME) IV 1 g (01/29/20 0900)  . diltiazem (CARDIZEM) infusion    . vancomycin Stopped (01/29/20 1800)   PRN Meds:.sodium chloride, sodium chloride, sodium chloride, acetaminophen **OR** acetaminophen, albuterol, cyclobenzaprine, HYDROcodone-acetaminophen, HYDROmorphone (DILAUDID) injection, labetalol, lidocaine (PF), lidocaine-prilocaine, ondansetron **OR** ondansetron (ZOFRAN) IV, oxyCODONE, pentafluoroprop-tetrafluoroeth, polyethylene glycol, sodium chloride flush, traMADol, traZODone    Anti-infectives (From admission, onward)   Start     Dose/Rate Route Frequency Ordered Stop   01/29/20 1800  vancomycin (VANCOCIN) IVPB 1000 mg/200 mL premix     Discontinue     1,000 mg 200 mL/hr over 60 Minutes Intravenous Every M-W-F  (1800) 01/28/20 2138     01/28/20 1730  vancomycin (VANCOCIN) IVPB 1000 mg/200 mL premix  Status:  Discontinued        1,000 mg 200 mL/hr over 60 Minutes Intravenous  Once 01/28/20 1729 01/28/20 1738   01/28/20 1200  vancomycin (VANCOREADY) IVPB 2000 mg/400 mL        2,000 mg 200 mL/hr over 120 Minutes Intravenous  Once 01/28/20 1115 01/28/20 1308   01/28/20 1200  piperacillin-tazobactam (ZOSYN) IVPB 2.25 g  Status:  Discontinued        2.25 g 100 mL/hr over 30 Minutes Intravenous Every 8 hours 01/28/20 1117 01/28/20 1126   01/28/20 1130  ceFEPIme (MAXIPIME) 1 g in sodium chloride 0.9 % 100 mL IVPB     Discontinue     1 g 200 mL/hr over 30 Minutes Intravenous Every 24 hours 01/28/20 1126          Objective:   Vitals:   01/29/20 1430 01/29/20 1500 01/29/20 1515 01/29/20 1856  BP: 98/67 110/68 118/72 (!) 111/45  Pulse: Marland Kitchen)  105 (!) 122 (!) 124   Resp:    17  Temp:      TempSrc:      SpO2:      Weight:   104 kg   Height:        Wt Readings from Last 3 Encounters:  01/29/20 104 kg  09/16/17 105.7 kg  08/17/17 104.8 kg     Intake/Output Summary (Last 24 hours) at 01/29/2020 1925 Last data filed at 01/29/2020 1510 Gross per 24 hour  Intake 1250 ml  Output 1727 ml  Net -477 ml   Physical Exam  Physical Examination: General appearance - alert, uncomfortable with dizziness  mental status - alert, oriented to person, place, and time,  Eyes - sclera anicteric Neck - supple, no JVD elevation , Chest - clear  to auscultation bilaterally, symmetrical air movement,  Heart - S1 and S2 normal, irregularly irregular, heart rate 133  abdomen - soft, nontender, nondistended, no masses or organomegaly Neurological - screening mental status exam normal, neck supple without rigidity, cranial nerves II through XII intact, DTR's normal and symmetric Extremities - no pedal edema noted, intact peripheral pulses Skin - -mid lower back with rather large soft cystic mass draining somewhat  serous fluid, there is tenderness, slight warmth, no significant erythema or streaking MSK- Lt arm AVF-with positive thrill and bruit   Data Review:   Micro Results Recent Results (from the past 240 hour(s))  Culture, blood (Routine x 2)     Status: None (Preliminary result)   Collection Time: 01/28/20  9:57 AM   Specimen: Right Antecubital; Blood  Result Value Ref Range Status   Specimen Description   Final    RIGHT ANTECUBITAL BOTTLES DRAWN AEROBIC AND ANAEROBIC   Special Requests Blood Culture adequate volume  Final   Culture   Final    NO GROWTH < 24 HOURS Performed at [facility], [address] [place], [place] [address]    Report Status PENDING  Incomplete  Culture, blood (Routine x 2)     Status: None (Preliminary result)   Collection Time: 01/28/20  9:57 AM   Specimen: BLOOD RIGHT HAND  Result Value Ref Range Status   Specimen Description BLOOD RIGHT HAND AEROBIC BOTTLE ONLY  Final   Special Requests   Final    Blood Culture results may not be optimal due to an inadequate volume of blood received in culture bottles   Culture   Final    NO GROWTH < 24 HOURS Performed at [facility], [address] [place], [place] [address]    Report Status PENDING  Incomplete  Aerobic/Anaerobic Culture (surgical/deep wound)     Status: None (Preliminary result)   Collection Time: 01/28/20 12:49 PM   Specimen: Back; Abscess  Result Value Ref Range Status   Specimen Description   Final    BACK Performed at [facility], [address], [place], [place] [address]    Special Requests   Final    Normal Performed at [address] [place], [place] [address]    Gram Stain   Final    FEW WBC PRESENT, PREDOMINANTLY PMN RARE GRAM POSITIVE COCCI IN PAIRS RARE GRAM POSITIVE RODS    Culture   Final    NO GROWTH < 24 HOURS Performed at [facility], [place] [address], [place], [place] [address]    Report Status PENDING  Incomplete  SARS Coronavirus 2 by RT PCR  (hospital order, performed in [facility] hospital  lab) Nasopharyngeal Nasopharyngeal Swab     Status: None   Collection Time: 01/28/20  1:53 PM   Specimen: Nasopharyngeal Swab  Result Value Ref Range Status   SARS Coronavirus 2 NEGATIVE NEGATIVE Final    Comment: (NOTE) SARS-CoV-2 target nucleic acids are NOT DETECTED.  The SARS-CoV-2 RNA is generally detectable in upper and lower respiratory specimens during the acute phase of infection. The lowest concentration of SARS-CoV-2 viral copies this assay can detect is 250 copies / mL. A negative result does not preclude SARS-CoV-2 infection and should not be used as the sole basis for treatment or other patient management decisions.  A negative result may occur with improper specimen collection / handling, submission of specimen other than nasopharyngeal swab, presence of viral mutation(s) within the areas targeted by this assay, and inadequate number of viral copies (<250 copies / mL). A negative result must be combined with clinical observations, patient history, and epidemiological information.  Fact Sheet for Patients:   StrictlyIdeas.no  Fact Sheet for Healthcare Providers: BankingDealers.co.za  This test is not yet approved or  cleared by the [place] FDA and has been authorized for detection and/or diagnosis of SARS-CoV-2 by FDA under an Emergency Use Authorization (EUA).  This EUA will remain in effect (meaning this test can be used) for the duration of the COVID-19 declaration under Section 564(b)(1) of the Act, 21 U.S.C. section 360bbb-3(b)(1), unless the authorization is terminated or revoked sooner.  Performed at [facility], [address], [place], [place] [address]     Radiology Reports DG Chest 2 View  Result Date: 01/28/2020 CLINICAL DATA:  Sepsis. EXAM: CHEST - 2 VIEW COMPARISON:  December 30, 2016. FINDINGS: Stable cardiomediastinal silhouette. No pneumothorax or  pleural effusion is noted. Lungs are clear. Bony thorax is unremarkable. IMPRESSION: No active cardiopulmonary disease. Aortic Atherosclerosis (ICD10-I70.0). Electronically Signed   By: Marijo Conception M.D.   On: 01/28/2020 10:54   CT ABDOMEN PELVIS W CONTRAST  Result Date: 01/28/2020 CLINICAL DATA:  Large fungating mass overlying the lumbar spine on the skin for the past 6 months. Clinical concern for infection with abscess. The patient is on dialysis. EXAM: CT ABDOMEN AND PELVIS WITH CONTRAST TECHNIQUE: Multidetector CT imaging of the abdomen and pelvis was performed using the standard protocol following bolus administration of intravenous contrast. CONTRAST:  134mL OMNIPAQUE IOHEXOL 300 MG/ML  SOLN COMPARISON:  None. FINDINGS: Lower chest: Atheromatous calcifications, including the coronary arteries and aorta. Hepatobiliary: No focal liver abnormality is seen. No gallstones, gallbladder wall thickening, or biliary dilatation. Pancreas: Unremarkable. No pancreatic ductal dilatation or surrounding inflammatory changes. Spleen: Normal in size without focal abnormality. Adrenals/Urinary Tract: Mild diffuse bilateral adrenal hyperplasia. Small kidneys. Multiple small rounded low density masses in both kidneys. Some of these have fluid density, compatible with cysts. Others are slightly higher than fluid density. The largest of these is in the lower pole of the left kidney, measuring 1.2 cm in maximum diameter and 20 Hounsfield units in density on the delayed images through the kidneys (image number 22 series 7). Poorly distended bladder with mild diffuse wall thickening. Unremarkable ureters. No urinary tract calculi or hydronephrosis. Stomach/Bowel: Small hiatal hernia. Minimal sigmoid colon diverticulosis. Normal appearing appendix. Unremarkable small bowel. Vascular/Lymphatic: Atheromatous arterial calcifications without aneurysm. No enlarged lymph nodes. Reproductive: Moderate Lea enlarged prostate gland.  Other: Rounded fluid and gas collection in the subcutaneous fat at the level of the lumbosacral junction, measuring 5.9 x 5.6 x 5.4 cm on image number 48 series  2 and sagittal image number 69. There is mild diffuse surrounding soft tissue thickening with overlying skin thickening. Small to moderate-sized umbilical hernia containing fat. 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 Lumbar and lower thoracic spine degenerative changes. IMPRESSION: 1. 5.9 x 5.6 x 5.4 cm subcutaneous abscess at the level of the lumbosacral junction posteriorly. A necrotic malignancy is less likely. 2. Moderate prostatic hypertrophy. 3. 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 4. Minimal sigmoid colon diverticulosis. 5. Small hiatal hernia. 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 Aortic Atherosclerosis (ICD10-I70.0). Electronically Signed   By: Claudie Revering M.D.   On: 01/28/2020 12:23   OCT, Retina - OU - Both Eyes  Result Date: 01/25/2020 Right Eye Quality was good. Scan locations included subfoveal. Central Foveal Thickness: 554. Progression has improved. Findings include abnormal foveal contour. Left Eye Quality was good. Scan locations included subfoveal. Notes OD, much less retinal thickening status post vitrectomy, membrane peel    CBC Recent Labs  Lab 01/28/20 0957 01/29/20 0616  WBC 18.3* 19.9*  HGB 9.4* 9.5*  HCT 30.8* 31.0*  PLT 257 287  MCV 102.7* 101.6*  MCH 31.3 31.1  MCHC 30.5 30.6  RDW 13.7 13.9  LYMPHSABS 1.5  --   MONOABS 1.8*  --   EOSABS 0.3  --   BASOSABS 0.1  --     Chemistries  Recent Labs  Lab 01/28/20 0957 01/29/20 0616  NA 137 137  K 3.9 4.3  CL 93* 92*  CO2 29 28  GLUCOSE 277* 251*  BUN 49* 57*  CREATININE 10.78* 12.01*  CALCIUM 8.4* 8.2*  AST 20  --   ALT 14  --   ALKPHOS 122  --     BILITOT 0.6  --    ------------------------------------------------------------------------------------------------------------------ No results for input(s): CHOL, HDL, LDLCALC, TRIG, CHOLHDL, LDLDIRECT in the last 72 hours.  Lab Results  Component Value Date   HGBA1C 6.1 (H) 01/28/2020   ------------------------------------------------------------------------------------------------------------------ No results for input(s): TSH, T4TOTAL, T3FREE, THYROIDAB in the last 72 hours.  Invalid input(s): FREET3 ------------------------------------------------------------------------------------------------------------------ No results for input(s): VITAMINB12, FOLATE, FERRITIN, TIBC, IRON, RETICCTPCT in the last 72 hours.  Coagulation profile Recent Labs  Lab 01/28/20 0957  INR 1.3*    No results for input(s): DDIMER in the last 72 hours.  Cardiac Enzymes No results for input(s): CKMB, TROPONINI, MYOGLOBIN in the last 168 hours.  Invalid input(s): CK ------------------------------------------------------------------------------------------------------------------ No results found for: BNP   Roxan Hockey M.D on 01/29/2020 at 7:25 PM  Go to [email or web address] - for contact info  [facility] - Office  [phone]

## 2020-01-29 NOTE — Progress Notes (Signed)
Inpatient Diabetes Program Recommendations  AACE/ADA: New Consensus Statement on Inpatient Glycemic Control   Target Ranges:  Prepandial:   less than 140 mg/dL      Peak postprandial:   less than 180 mg/dL (1-2 hours)      Critically ill patients:  140 - 180 mg/dL   Results for Justin [name], Justin [name] (MRN [id number]) as of 01/29/2020 13:24  Ref. Range 01/28/2020 17:35 01/28/2020 21:15 01/29/2020 07:15 01/29/2020 11:16  Glucose-Capillary Latest Ref Range: 70 - 99 mg/dL 199 (H) 275 (H) 234 (H) 251 (H)  Results for Justin [name], Justin [name] (MRN [id number]) as of 01/29/2020 13:24  Ref. Range 01/28/2020 09:51  Hemoglobin A1C Latest Ref Range: 4.8 - 5.6 % 6.1 (H)   Review of Glycemic Control  Diabetes history: DM2 Outpatient Diabetes medications: None Current orders for Inpatient glycemic control: Lantus 10 units QHS, Novolog 0-6 units TID with meals, Novolog 0-5 units QHS, Novolog 2 units TID with meals  Inpatient Diabetes Program Recommendations:    HbgA1C:  A1C 6.1% on 01/28/20 indicating an average glucose of 128 mg/dl over the past 2-3 months.  NOTE: Spoke with patient over the phone regarding DM. Patient states that he does not currently take any DM medications as an outpatient and states that he has not taken any DM medications in about 3 years. Patient notes that he took insulin and oral DM medication in the past. Patient reports that he still checks glucose 2-3 times a week and it runs 100-150 mg/dl. Discussed current insulin orders with noted hyperglycemia. Will continue to follow along and make recommendations if needed.   Thanks, Barnie Alderman, RN, MSN, CDE Diabetes Coordinator Inpatient Diabetes Program [phone] (Team Pager from 8am to 5pm)

## 2020-01-29 NOTE — Progress Notes (Signed)
Wound to back cleansed  with NSS and dressed with gauze and abd. Pad.

## 2020-01-30 ENCOUNTER — Inpatient Hospital Stay (HOSPITAL_COMMUNITY): Payer: Medicare Other

## 2020-01-30 DIAGNOSIS — I4892 Unspecified atrial flutter: Secondary | ICD-10-CM

## 2020-01-30 DIAGNOSIS — I350 Nonrheumatic aortic (valve) stenosis: Secondary | ICD-10-CM

## 2020-01-30 DIAGNOSIS — N186 End stage renal disease: Secondary | ICD-10-CM

## 2020-01-30 DIAGNOSIS — I10 Essential (primary) hypertension: Secondary | ICD-10-CM

## 2020-01-30 DIAGNOSIS — R011 Cardiac murmur, unspecified: Secondary | ICD-10-CM

## 2020-01-30 DIAGNOSIS — I361 Nonrheumatic tricuspid (valve) insufficiency: Secondary | ICD-10-CM

## 2020-01-30 LAB — ECHOCARDIOGRAM COMPLETE
AR max vel: 1.23 cm2
AV Area VTI: 1.38 cm2
AV Area mean vel: 1.49 cm2
AV Mean grad: 15 mmHg
AV Peak grad: 29.4 mmHg
Ao pk vel: 2.71 m/s
Area-P 1/2: 3.12 cm2
Height: 70 in
S' Lateral: 2.27 cm
Weight: 3731.95 oz

## 2020-01-30 LAB — BASIC METABOLIC PANEL
Anion gap: 14 (ref 5–15)
BUN: 37 mg/dL — ABNORMAL HIGH (ref 8–23)
CO2: 28 mmol/L (ref 22–32)
Calcium: 8.1 mg/dL — ABNORMAL LOW (ref 8.9–10.3)
Chloride: 92 mmol/L — ABNORMAL LOW (ref 98–111)
Creatinine, Ser: 8.09 mg/dL — ABNORMAL HIGH (ref 0.61–1.24)
GFR calc Af Amer: 7 mL/min — ABNORMAL LOW (ref >60)
GFR calc non Af Amer: 6 mL/min — ABNORMAL LOW (ref >60)
Glucose, Bld: 183 mg/dL — ABNORMAL HIGH (ref 70–99)
Potassium: 4 mmol/L (ref 3.5–5.1)
Sodium: 134 mmol/L — ABNORMAL LOW (ref 135–145)

## 2020-01-30 LAB — CBC
HCT: 29.1 % — ABNORMAL LOW (ref 39.0–52.0)
Hemoglobin: 8.9 g/dL — ABNORMAL LOW (ref 13.0–17.0)
MCH: 30.9 pg (ref 26.0–34.0)
MCHC: 30.6 g/dL (ref 30.0–36.0)
MCV: 101 fL — ABNORMAL HIGH (ref 80.0–100.0)
Platelets: 254 10*3/uL (ref 150–400)
RBC: 2.88 MIL/uL — ABNORMAL LOW (ref 4.22–5.81)
RDW: 13.8 % (ref 11.5–15.5)
WBC: 14.5 10*3/uL — ABNORMAL HIGH (ref 4.0–10.5)
nRBC: 0 % (ref 0.0–0.2)

## 2020-01-30 LAB — GLUCOSE, CAPILLARY
Glucose-Capillary: 155 mg/dL — ABNORMAL HIGH (ref 70–99)
Glucose-Capillary: 218 mg/dL — ABNORMAL HIGH (ref 70–99)
Glucose-Capillary: 139 mg/dL — ABNORMAL HIGH (ref 70–99)
Glucose-Capillary: 187 mg/dL — ABNORMAL HIGH (ref 70–99)

## 2020-01-30 MED ORDER — SODIUM CHLORIDE 0.9 % IV SOLN
250.0000 mL | Freq: Once | INTRAVENOUS | Status: AC
  Administered 2020-01-30: 250 mL via INTRAVENOUS

## 2020-01-30 MED ORDER — METOPROLOL TARTRATE 25 MG PO TABS
25.0000 mg | ORAL_TABLET | Freq: Two times a day (BID) | ORAL | Status: DC
  Administered 2020-01-30 – 2020-02-01 (×4): 25 mg via ORAL
  Filled 2020-01-30 (×5): qty 1

## 2020-01-30 MED ORDER — APIXABAN 5 MG PO TABS
5.0000 mg | ORAL_TABLET | Freq: Two times a day (BID) | ORAL | Status: DC
  Administered 2020-01-30 – 2020-02-01 (×4): 5 mg via ORAL
  Filled 2020-01-30 (×4): qty 1

## 2020-01-30 NOTE — Progress Notes (Signed)
Nurse called due to patient's BP at 74/37 with MAP of 48.  It was noted that patient was still on IV Cardizem drip.  HR was 85.  Cardizem was temporarily held, IV NS 250 mL x 1 ordered.  Patient was in no acute distress.  We shall continue to monitor patient

## 2020-01-30 NOTE — Progress Notes (Signed)
.  Events of last night noted.  Dressing changes being performed as ordered.  Will reexamine wound in am.

## 2020-01-30 NOTE — Consult Note (Addendum)
Cardiology Consultation:   Patient ID: Justin [name] MRN: [id number]; DOB: 1945-07-24  Admit date: 01/28/2020 Date of Consult: 01/30/2020  Primary Care Provider: Kendrick Ranch, MD [facility] Cardiologist: New to [facility] - Dr. Drisc[facility] Electrophysiologist:  None    Patient Profile:   Justin [name] is a 74 y.o. 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 Malachi Carl.  History of Present Illness:   Justin [name] presented to [facility] ED on 01/28/2020 for evaluation of worsening back pain for the past 6 months and new drainage along an abscess. He did undergo simple I&D while in the ED with admission recommended for further management (culture growing gram positive cocci and gram positive rods). Initial labs showed WBC 18.3, Hgb 9.4, platelets 257, Na+ 137, K+ 3.9 and creatinine 10.78. Lactic Acid 1.6. CXR showed no active cardiopulmonary disease. CT Abdomen showed a 5.9 x 5.6 x 5.4 cm subcutaneous abscess at the level of the lumbosacral junction posteriorly. EKG on admission has significant artifact but appears most consistent with a sinus tachycardia, HR 127 with nonspecific ST abnormality along the inferior leads.   He was started on IV Vancomycin and Cefepime at the time of admission for cellulitis. He was on Eliquis 5mg  BID prior to admission but this was held in case surgical interventions were indicated. He did undergo HD yesterday with -1.7L.    Following HD, he developed persistent tachycardia. He received IV Lopressor 5mg  and IV Digoxin 0.125mg  daily with no improvement and was transferred to [facility] for initiation of IV Cardizem. Was listed as being continued on Coreg 25mg  BID (on Lopressor 100mg  BID prior to admission).   He was initially in atrial flutter with RVR and variable 2:1  and 3:1 block. Around 0545 this morning he converted to NSR. Has been in NSR with 1st degree AV Block and HR in the 70's to 80's. Brief episodes of bradycardia this AM which appears most consistent with Wenckebach with prolonged PR interval then dropped beats.   In talking with the patient, he reports nausea yesterday afternoon but denies any associated chest pain or palpitations. Breathing has been at baseline without orthopnea or PND. No recent edema. He does report having hypotension on HD days and typically holds his BP medications the morning of dialysis. He is unaware of any history of atrial fibrillation or atrial flutter but says he was evaluated by Cardiology in [place], [place] and started on Lopressor and Eliquis at that time for his "cardiac murmur".    Past Medical History:  Diagnosis Date  . Anemia   . Anemia in chronic kidney disease 11/20/2015  . Cancer [facility]) 2009   Prostate; radiation seeds  . Chronic kidney disease    stage 4  . Diabetes mellitus   . Gout   . Hypertension     Past Surgical History:  Procedure Laterality Date  . A/V FISTULAGRAM Left 08/17/2017   Procedure: A/V FISTULAGRAM;  Surgeon: Katha Cabal, MD;  Location: [place] CV LAB;  Service: Cardiovascular;  Laterality: Left;  . A/V SHUNT INTERVENTION N/A 08/17/2017   Procedure: A/V SHUNT INTERVENTION;  Surgeon: Katha Cabal, MD;  Location: [place] CV LAB;  Service: Cardiovascular;  Laterality: N/A;  . AV FISTULA PLACEMENT Left 05/31/2015   Procedure: ARTERIOVENOUS (AV) FISTULA CREATION;  Surgeon:  Katha Cabal, MD;  Location: [facility] ORS;  Service: Vascular;  Laterality: Left;  . excision bone spurs Bilateral 1989   feet  . KNEE SURGERY Left 1998   arthroscopy  . SHOULDER SURGERY Left 1994   rotator cuff     Home Medications:  Prior to Admission medications   Medication Sig Start Date End Date Taking? Authorizing Provider  allopurinol (ZYLOPRIM) 300 MG tablet Take 300 mg by mouth  daily. 10/13/19  Yes [provider]  atorvastatin (LIPITOR) 40 MG tablet Take 40 mg by mouth daily.  10/30/14  Yes [provider]  ELIQUIS 5 MG TABS tablet Take 5 mg by mouth 2 (two) times daily. 10/07/19  Yes [provider]  metoprolol tartrate (LOPRESSOR) 100 MG tablet Take 100 mg by mouth 2 (two) times daily. 11/25/19  Yes [provider]  omeprazole (PRILOSEC) 20 MG capsule Take 20 mg by mouth every morning.    Yes [provider]    Inpatient Medications: Scheduled Meds: . allopurinol  100 mg Oral BH-q7a  . allopurinol  300 mg Oral Daily  . apixaban  2.5 mg Oral BID  . aspirin EC  81 mg Oral BH-q7a  . atorvastatin  40 mg Oral Daily  . Chlorhexidine Gluconate Cloth  6 each Topical Daily  . collagenase   Topical UD  . doxercalciferol  12 mcg Intravenous Q M,W,F-HD  . insulin aspart  0-5 Units Subcutaneous QHS  . insulin aspart  0-6 Units Subcutaneous TID WC  . insulin aspart  2 Units Subcutaneous TID WC  . insulin glargine  10 Units Subcutaneous QHS  . lidocaine (PF)  5 mL Other Once  . metoprolol tartrate  25 mg Oral BID  . multivitamin with minerals  1 tablet Oral Daily  . ofloxacin  1 drop Right Eye QID  . pantoprazole  40 mg Oral Daily  . potassium chloride  10 mEq Oral Daily  . prednisoLONE acetate  1 drop Right Eye QID  . sodium chloride flush  3 mL Intravenous Q12H  . sucroferric oxyhydroxide  500 mg Oral TID   Continuous Infusions: . sodium chloride Stopped (01/29/20 2220)  . sodium chloride 100 mL (01/30/20 0019)  . sodium chloride    . ceFEPime (MAXIPIME) IV 1 g (01/29/20 0900)  . diltiazem (CARDIZEM) infusion    . vancomycin Stopped (01/29/20 1800)   PRN Meds: sodium chloride, sodium chloride, sodium chloride, acetaminophen **OR** acetaminophen, albuterol, cyclobenzaprine, HYDROcodone-acetaminophen, HYDROmorphone (DILAUDID) injection, labetalol, lidocaine (PF), lidocaine-prilocaine, ondansetron **OR** ondansetron  (ZOFRAN) IV, oxyCODONE, pentafluoroprop-tetrafluoroeth, polyethylene glycol, sodium chloride flush, traMADol, traZODone  Allergies:   No Known Allergies  Social History:   Social History   Socioeconomic History  . Marital status: Widowed    Spouse name: Not on file  . Number of children: Not on file  . Years of education: Not on file  . Highest education level: Not on file  Occupational History  . Not on file  Tobacco Use  . Smoking status: Former Smoker    Quit date: 10/01/1978    Years since quitting: 41.3  . Smokeless tobacco: Never Used  Substance and Sexual Activity  . Alcohol use: No  . Drug use: No  . Sexual activity: Not on file  Other Topics Concern  . Not on file  Social History Narrative  . Not on file   Social Determinants of Health   Financial Resource Strain:   . Difficulty of Paying Living Expenses:   Food Insecurity:   .  Worried About Charity fundraiser in the Last Year:   . Arboriculturist in the Last Year:   Transportation Needs:   . Film/video editor (Medical):   Marland Kitchen Lack of Transportation (Non-Medical):   Physical Activity:   . Days of Exercise per Week:   . Minutes of Exercise per Session:   Stress:   . Feeling of Stress :   Social Connections:   . Frequency of Communication with Friends and Family:   . Frequency of Social Gatherings with Friends and Family:   . Attends Religious Services:   . Active Member of Clubs or Organizations:   . Attends Archivist Meetings:   Marland Kitchen Marital Status:   Intimate Partner Violence:   . Fear of Current or Ex-Partner:   . Emotionally Abused:   Marland Kitchen Physically Abused:   . Sexually Abused:     Family History:    Family History  Problem Relation Age of Onset  . Kidney disease Mother   . Alcoholism Father   . Alcoholism Brother      ROS:  Please see the history of present illness.   All other ROS reviewed and negative.     Physical Exam/Data:   Vitals:   01/30/20 0645 01/30/20 0700  01/30/20 0800 01/30/20 0830  BP: (!) 89/42  107/71 (!) 109/34  Pulse:      Resp: (!) 23  19 12   Temp:  98.4 F (36.9 C)    TempSrc:      SpO2:      Weight:      Height:        Intake/Output Summary (Last 24 hours) at 01/30/2020 0915 Last data filed at 01/30/2020 0256 Gross per 24 hour  Intake 760.86 ml  Output 1727 ml  Net -966.14 ml   Last 3 Weights 01/30/2020 01/29/2020 01/29/2020  Weight (lbs) 233 lb 4 oz 229 lb 4.5 oz 232 lb 5.8 oz  Weight (kg) 105.8 kg 104 kg 105.4 kg     Body mass index is 33.47 kg/m.  General:  Well nourished, well developed male appearing in no acute distress HEENT: normal Lymph: no adenopathy Neck: no JVD Endocrine:  No thryomegaly Vascular: No carotid bruits; FA pulses 2+ bilaterally without bruits  Cardiac:  normal S1, S2; RRR; 3/6 SEM along RUSB.  Lungs:  clear to auscultation bilaterally, no wheezing, rhonchi or rales  Abd: soft, nontender, no hepatomegaly  Ext: no edema Musculoskeletal:  No deformities, BUE and BLE strength normal and equal Skin: warm and dry  Neuro:  CNs 2-12 intact, no focal abnormalities noted Psych:  Normal affect   EKG:  The EKG was personally reviewed and demonstrates:  Sinus tachycardia, HR 127 with nonspecific ST abnormality along the inferior leads.     Telemetry:  Telemetry was personally reviewed and demonstrates: Atrial flutter with RVR and variable 2:1 and 3:1 block. Around 0545 this morning he converted to NSR. Has been in NSR with 1st degree AV Block and HR in the 70's to 80's.   Relevant CV Studies:  None on File  Laboratory Data:  High Sensitivity Troponin:   Recent Labs  Lab 01/29/20 1902 01/29/20 2102  TROPONINIHS 13 12     Chemistry Recent Labs  Lab 01/29/20 0616 01/29/20 1902 01/30/20 0532  NA 137 133* 134*  K 4.3 4.3 4.0  CL 92* 92* 92*  CO2 28 27 28   GLUCOSE 251* 252* 183*  BUN 57* 29* 37*  CREATININE 12.01* 7.10* 8.09*  CALCIUM 8.2* 8.2* 8.1*  GFRNONAA 4* 7* 6*  GFRAA 4* 8* 7*    ANIONGAP 17* 14 14    Recent Labs  Lab 01/28/20 0957 01/29/20 1902  PROT 7.7 7.5  ALBUMIN 3.0* 2.7*  AST 20 18  ALT 14 13  ALKPHOS 122 96  BILITOT 0.6 0.8   Hematology Recent Labs  Lab 01/29/20 0616 01/29/20 1902 01/30/20 0532  WBC 19.9* 16.3* 14.5*  RBC 3.05* 3.02* 2.88*  HGB 9.5* 9.6* 8.9*  HCT 31.0* 31.2* 29.1*  MCV 101.6* 103.3* 101.0*  MCH 31.1 31.8 30.9  MCHC 30.6 30.8 30.6  RDW 13.9 13.9 13.8  PLT 287 257 254   BNPNo results for input(s): BNP, PROBNP in the last 168 hours.  DDimer No results for input(s): DDIMER in the last 168 hours.   Radiology/Studies:  DG Chest 2 View  Result Date: 01/28/2020 CLINICAL DATA:  Sepsis. EXAM: CHEST - 2 VIEW COMPARISON:  December 30, 2016. FINDINGS: Stable cardiomediastinal silhouette. No pneumothorax or pleural effusion is noted. Lungs are clear. Bony thorax is unremarkable. IMPRESSION: No active cardiopulmonary disease. Aortic Atherosclerosis (ICD10-I70.0). Electronically Signed   By: Marijo Conception M.D.   On: 01/28/2020 10:54   CT ABDOMEN PELVIS W CONTRAST  Result Date: 01/28/2020 CLINICAL DATA:  Large fungating mass overlying the lumbar spine on the skin for the past 6 months. Clinical concern for infection with abscess. The patient is on dialysis. EXAM: CT ABDOMEN AND PELVIS WITH CONTRAST TECHNIQUE: Multidetector CT imaging of the abdomen and pelvis was performed using the standard protocol following bolus administration of intravenous contrast. CONTRAST:  154mL OMNIPAQUE IOHEXOL 300 MG/ML  SOLN COMPARISON:  None. FINDINGS: Lower chest: Atheromatous calcifications, including the coronary arteries and aorta. Hepatobiliary: No focal liver abnormality is seen. No gallstones, gallbladder wall thickening, or biliary dilatation. Pancreas: Unremarkable. No pancreatic ductal dilatation or surrounding inflammatory changes. Spleen: Normal in size without focal abnormality. Adrenals/Urinary Tract: Mild diffuse bilateral adrenal hyperplasia.  Small kidneys. Multiple small rounded low density masses in both kidneys. Some of these have fluid density, compatible with cysts. Others are slightly higher than fluid density. The largest of these is in the lower pole of the left kidney, measuring 1.2 cm in maximum diameter and 20 Hounsfield units in density on the delayed images through the kidneys (image number 22 series 7). Poorly distended bladder with mild diffuse wall thickening. Unremarkable ureters. No urinary tract calculi or hydronephrosis. Stomach/Bowel: Small hiatal hernia. Minimal sigmoid colon diverticulosis. Normal appearing appendix. Unremarkable small bowel. Vascular/Lymphatic: Atheromatous arterial calcifications without aneurysm. No enlarged lymph nodes. Reproductive: Moderate Lea enlarged prostate gland. Other: Rounded fluid and gas collection in the subcutaneous fat at the level of the lumbosacral junction, measuring 5.9 x 5.6 x 5.4 cm on image number 48 series 2 and sagittal image number 69. There is mild diffuse surrounding soft tissue thickening with overlying skin thickening. Small to moderate-sized umbilical hernia containing fat. 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 Lumbar and lower thoracic spine degenerative changes. IMPRESSION: 1. 5.9 x 5.6 x 5.4 cm subcutaneous abscess at the level of the lumbosacral junction posteriorly. A necrotic malignancy is less likely. 2. Moderate prostatic hypertrophy. 3. 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 4. Minimal sigmoid colon diverticulosis. 5. Small hiatal hernia. 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 Aortic Atherosclerosis (ICD10-I70.0). Electronically Signed   By: Claudie Revering M.D.   On: 01/28/2020  12:23   {  Assessment and Plan:   1. Atrial Flutter with RVR - He developed tachycardia  after HD yesterday and an EKG was not obtained but by review of telemetry, this appears most consistent with atrial flutter with RVR. Initially received IV Lopressor and IV Digoxin with no improvement and was started on IV Cardizem. Around 0545 this AM, he converted to NSR and has been in this since and BP has improved as SBP was in the 70's at times while on IV Cardizem.  - He is unaware of any history of atrial fibrillation or flutter but reports being evaluated by Cardiology in [place] in the past and started on Eliquis and Lopressor for his "murmur". Suspect he has a history of an arrhythmia given his current medications.  - This patients CHA2DS2-VASc Score and unadjusted Ischemic Stroke Rate (% per year) is equal to 4.8 % stroke rate/year from a score of 4 (HTN, Vascular, Age (2)). He was on Eliquis 5mg  BID prior to admission and this is currently held in case invasive procedures are indicated. Would restart once determined he will not require surgical intervention.   - He was on Lopressor 100mg  BID prior to admission but this was switched to Coreg. Would restart Lopressor at a lower dose of 25 mg BID. Prefer this over Coreg for now given his soft BP.   2. Systolic Ejection Murmur - He reports having been diagnosed with a murmur as a child. 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 Repeat echocardiogram is pending for further evaluation.   3. HTN - He developed hypotension while on HD. SBP now in the low-100's. Will restart Lopressor at a lower dose of 25mg  BID (on 100mg  BID prior to admission). Prior notes mention PTA Amlodipine, Hydralazine and Losartan were held but it does not appear he was on these prior to admission by review of his Home Medications.   4. Lumbar Abscess - CT Abdomen this admission shows a 5.9 x 5.6 x 5.4 cm subcutaneous abscess at the level of the lumbosacral junction  posteriorly. Remains on IV Vancomycin and Cefepime. Per admitting team and General Surgery.   5. ESRD - On HD. Nephrology following.   For questions or updates, please contact [place] Please consult [email or web address] for contact info under    Signed, Erma Heritage, PA-C  01/30/2020 9:15 AM   Attending note  Patient seen and disucssed with PA Ahmed Prima, I agree with her docuementation. 74 yo male history of ESRD, DM2, HTN, anemia admitted with wound infection of lower back. Being managed by primary team and surgery. During admission episode of aflutter with RVR. Started on IV cardizem, converted back to SR. Agree with starting oral lopressor 25mg  bid, he had been on eliquis at home due to presumed prior history of aflutter though he is unclear. Titrate beta blocker as tolerated based on bp's. He has been on asa and eliquis at home, unclear records from when he saw cardiology in [place]. At this time I defer regimen to his primary cardiologist. I typically dose eliquis 5mg  bid in ESRD patients, during admission he was lowered to 2.5mg  bid due to kidney function, the data is limited in this population, I will consult pharmacy to confirm dosing.   We will follow telemetry tomorrow, f/u echo   Carlyle Dolly MD

## 2020-01-30 NOTE — Progress Notes (Signed)
Admit: 01/28/2020 LOS: 1  73 ESRD MWF [place] via LUE AVF, subcutaneous abscess in lower back, hospitalization complicated by AFib/Flutter with some hypotensio  Subjective:  . HD yesterday: 1.7 L ultrafiltration . Developed tachycardia, likely flutter, requiring transfer to ICU because of hypotension.  Placed on diltiazem drip.  Received digoxin, pulse normalized. . No complaints this morning. . Remains on vancomycin and cefepime.  07/19 0701 - 07/20 0700 In: 760.9 [I.V.:460.9; IV Piggyback:300] Out: 1727   Filed Weights   01/29/20 1120 01/29/20 1515 01/30/20 0500  Weight: 105.4 kg 104 kg 105.8 kg    Scheduled Meds: . allopurinol  100 mg Oral BH-q7a  . allopurinol  300 mg Oral Daily  . apixaban  2.5 mg Oral BID  . aspirin EC  81 mg Oral BH-q7a  . atorvastatin  40 mg Oral Daily  . Chlorhexidine Gluconate Cloth  6 each Topical Daily  . collagenase   Topical UD  . doxercalciferol  12 mcg Intravenous Q M,W,F-HD  . insulin aspart  0-5 Units Subcutaneous QHS  . insulin aspart  0-6 Units Subcutaneous TID WC  . insulin aspart  2 Units Subcutaneous TID WC  . insulin glargine  10 Units Subcutaneous QHS  . lidocaine (PF)  5 mL Other Once  . metoprolol tartrate  25 mg Oral BID  . multivitamin with minerals  1 tablet Oral Daily  . ofloxacin  1 drop Right Eye QID  . pantoprazole  40 mg Oral Daily  . potassium chloride  10 mEq Oral Daily  . prednisoLONE acetate  1 drop Right Eye QID  . sodium chloride flush  3 mL Intravenous Q12H  . sucroferric oxyhydroxide  500 mg Oral TID   Continuous Infusions: . sodium chloride Stopped (01/29/20 2220)  . sodium chloride 100 mL (01/30/20 0019)  . sodium chloride    . ceFEPime (MAXIPIME) IV 1 g (01/29/20 0900)  . diltiazem (CARDIZEM) infusion    . vancomycin Stopped (01/29/20 1800)   PRN Meds:.sodium chloride, sodium chloride, sodium chloride, acetaminophen **OR** acetaminophen, albuterol, cyclobenzaprine, HYDROcodone-acetaminophen,  HYDROmorphone (DILAUDID) injection, labetalol, lidocaine (PF), lidocaine-prilocaine, ondansetron **OR** ondansetron (ZOFRAN) IV, oxyCODONE, pentafluoroprop-tetrafluoroeth, polyethylene glycol, sodium chloride flush, traMADol, traZODone  Current Labs: reviewed   Physical Exam:  Blood pressure (!) 109/34, pulse (!) 122, temperature 98.4 F (36.9 C), resp. rate 12, height 5\' 10"  (1.778 m), weight 105.8 kg, SpO2 100 %. GEN: NAD, sitting in bed ENT: NCAT EYES: EOMI CV: nl rate,  Cont murmur, nl s1s2 - prob AVF murmur PULM: CTAB nl s1s2 ABD: s/nt/nd  SKIN: bandaged lower back EXT: Trace LEE NEURO: Nonfocal  A 1. ESRD LUE AVF MWF [place] 2. Subcutaneous abscess: Vanc/Cefepime, drained.  Gen surg following 3. Anemia, Hb 8.9 this AM 4. CKD-BMD: Ca ok; high dose VDRA 5. Tachycardia, ? Related #2, Flutter/Fib. Cardiology eval today 6. HTN: BP stable, meds held 2/2 #5  P . HD on schedule: 2K, 3.5h, AVF 400/800, 1-2L UF SBP > 105, no heparin . Medication Issues; o Preferred narcotic agents for pain control are hydromorphone, fentanyl, and methadone. Morphine should not be used.  o Baclofen should be avoided o Avoid oral sodium phosphate and magnesium citrate based laxatives / bowel preps    Pearson Grippe MD 01/30/2020, 9:12 AM  Recent Labs  Lab 01/29/20 0616 01/29/20 1902 01/30/20 0532  NA 137 133* 134*  K 4.3 4.3 4.0  CL 92* 92* 92*  CO2 28 27 28   GLUCOSE 251* 252* 183*  BUN 57* 29* 37*  CREATININE 12.01* 7.10* 8.09*  CALCIUM 8.2* 8.2* 8.1*   Recent Labs  Lab 01/28/20 0957 01/28/20 0957 01/29/20 0616 01/29/20 1902 01/30/20 0532  WBC 18.3*   < > 19.9* 16.3* 14.5*  NEUTROABS 14.5*  --   --   --   --   HGB 9.4*   < > 9.5* 9.6* 8.9*  HCT 30.8*   < > 31.0* 31.2* 29.1*  MCV 102.7*   < > 101.6* 103.3* 101.0*  PLT 257   < > 287 257 254   < > = values in this interval not displayed.

## 2020-01-30 NOTE — Progress Notes (Signed)
Patient Demographics:    Justin [name], is a 74 y.o. male, DOB - [birth year], JAS:[id number]  Admit date - 01/28/2020   Admitting Physician Karely Hurtado Denton Brick, MD  Outpatient Primary MD for the patient is [name], [name], MD  LOS - 1   Chief Complaint  Patient presents with   Abscess        Subjective:    Justin [name] today has no fevers, no emesis,  No chest pain,   - -No significant dizziness, no palpitations or shortness of breath at this time -BP Remains pretty soft    Assessment  & Plan :    Principal Problem:   Wound infection Active Problems:   ESRD on dialysis [facility])   Atrial flutter (HCC)   DM (diabetes mellitus) ([place])   HTN (hypertension)   Anemia in chronic kidney disease   Abscess  Brief Summary:-  74 y.o. male reformed smoker with past medical history relevant for ESRD, getting HD on Mondays Wednesdays and Fridays, DM 2,HTN, anemia of ESRD, chronic anticoagulation with apixaban admitted on 01/28/2020 with lumbar area abscess/cellulitis, subsequently patient developed atrial flutter with hypotension after completing hemodialysis on 01/29/2020 -Converted back to sinus rhythm in the early hours of 01/30/2020, off Cardizem drip at this time   A/p 1) atrial flutter with RVR and hemodynamic concerns--patient developed tachycardia after hemodialysis, received IV metoprolol tachycardia persisted, transferred to stepdown unit for IV Cardizem -Converted back to sinus rhythm in the early hours of 01/30/2020, off Cardizem drip at this time Continue  Eliquis for anticoagulation -Echocardiogram with EF of 60 to 65%, unable to determine diagnostic parameters -TSH is 0.83 -Potassium is 4.0, mag noted -Troponin not elevated -Blood pressure soft, hold torsemide, hold amlodipine, hold hydralazine and hold losartan in order to allow for titration of  metoprolol for rate control in the setting  of atrial flutter with RVR   2)Lumbar Area Abscess/Cellulitis/5.9 x 5.6 x 5.4 cm subcutaneous Abscess--as per patient lumbar area  cystic lesion/mass has been present for at least 2 years, it  got bigger over the last 6 months and then started draining just within the last 24 hours prior to admission Does Not meet Sepsis  --------IV vancomycin as per cellulitis order set, cefepime added -General surgery consult for possible intervention -Await culture data from swab obtained by ED provider--  --preliminary Gram stain/culture with- Gram-positive cocci and rods -CT abdomen and pelvis demonstrating abscess as noted    3) ESRD--- gets HD through left arm AV fistula Mondays Wednesdays and Fridays -Last HD session on 01/29/2020 -Nephrology consult appreciated  4)DM2-patient with diabetic nephropathy resume home insulin regimen Use Novolog/Humalog Sliding scale insulin with Accu-Cheks/Fingersticks as ordered   4)Anemia of ESRD--Hgb stable at 8.9 ESA/Procrit per nephrology team  5)Chronic anticoagulation--- PTA patient was on apixaban 5 mg twice daily hold in case general surgeon decides to do open I&D -Apixaban adjusted to 2.5 mg twice daily for renal function  6)HTN--stable, -Blood pressure soft, hold torsemide, hold amlodipine, hold hydralazine and hold losartan in order to allow for titration of Cardizem and metoprolol for rate control in the setting of atrial flutter with RVR, c/n Coreg 25 mg twice daily     Disposition/Need for in-Hospital Stay- patient unable to be discharged at this  time due to -- soft BP, lumbar area abscess requiring iv abx, atrial flutter requiring further rate control  Dispo: The patient is from: Home  Anticipated d/c is to: Home  Anticipated d/c date is: 1 day  Patient currently is not medically stable to d/c. Barriers: Not Clinically Stable- - soft BP, lumbar area abscess requiring iv abx, atrial flutter requiring further  rate control  Code Status : full  Family Communication:   (patient is alert, awake and coherent)   Consults  :  gen surge/Nephrology/cardiio  DVT Prophylaxis  : Eliquis Lab Results  Component Value Date   PLT 254 01/30/2020    Inpatient Medications  Scheduled Meds:  allopurinol  100 mg Oral BH-q7a   allopurinol  300 mg Oral Daily   apixaban  5 mg Oral BID   aspirin EC  81 mg Oral BH-q7a   atorvastatin  40 mg Oral Daily   Chlorhexidine Gluconate Cloth  6 each Topical Daily   collagenase   Topical UD   doxercalciferol  12 mcg Intravenous Q M,W,F-HD   insulin aspart  0-5 Units Subcutaneous QHS   insulin aspart  0-6 Units Subcutaneous TID WC   insulin aspart  2 Units Subcutaneous TID WC   insulin glargine  10 Units Subcutaneous QHS   lidocaine (PF)  5 mL Other Once   metoprolol tartrate  25 mg Oral BID   multivitamin with minerals  1 tablet Oral Daily   ofloxacin  1 drop Right Eye QID   pantoprazole  40 mg Oral Daily   potassium chloride  10 mEq Oral Daily   prednisoLONE acetate  1 drop Right Eye QID   sodium chloride flush  3 mL Intravenous Q12H   sucroferric oxyhydroxide  500 mg Oral TID   Continuous Infusions:  sodium chloride Stopped (01/29/20 2220)   sodium chloride 100 mL (01/30/20 0019)   sodium chloride     ceFEPime (MAXIPIME) IV 1 g (01/30/20 1152)   diltiazem (CARDIZEM) infusion     vancomycin Stopped (01/29/20 1800)   PRN Meds:.sodium chloride, sodium chloride, sodium chloride, acetaminophen **OR** acetaminophen, albuterol, cyclobenzaprine, HYDROcodone-acetaminophen, HYDROmorphone (DILAUDID) injection, labetalol, lidocaine (PF), lidocaine-prilocaine, ondansetron **OR** ondansetron (ZOFRAN) IV, oxyCODONE, pentafluoroprop-tetrafluoroeth, polyethylene glycol, sodium chloride flush, traMADol, traZODone    Anti-infectives (From admission, onward)   Start     Dose/Rate Route Frequency Ordered Stop   01/29/20 1800  vancomycin (VANCOCIN)  IVPB 1000 mg/200 mL premix     Discontinue     1,000 mg 200 mL/hr over 60 Minutes Intravenous Every M-W-F (1800) 01/28/20 2138     01/28/20 1730  vancomycin (VANCOCIN) IVPB 1000 mg/200 mL premix  Status:  Discontinued        1,000 mg 200 mL/hr over 60 Minutes Intravenous  Once 01/28/20 1729 01/28/20 1738   01/28/20 1200  vancomycin (VANCOREADY) IVPB 2000 mg/400 mL        2,000 mg 200 mL/hr over 120 Minutes Intravenous  Once 01/28/20 1115 01/28/20 1308   01/28/20 1200  piperacillin-tazobactam (ZOSYN) IVPB 2.25 g  Status:  Discontinued        2.25 g 100 mL/hr over 30 Minutes Intravenous Every 8 hours 01/28/20 1117 01/28/20 1126   01/28/20 1130  ceFEPIme (MAXIPIME) 1 g in sodium chloride 0.9 % 100 mL IVPB     Discontinue     1 g 200 mL/hr over 30 Minutes Intravenous Every 24 hours 01/28/20 1126          Objective:   Vitals:  01/30/20 1400 01/30/20 1647 01/30/20 1800 01/30/20 1900  BP: (!) 108/55  (!) 118/46 (!) 92/57  Pulse:    87  Resp: 16  (!) 21 10  Temp:  97.6 F (36.4 C)    TempSrc:  Oral    SpO2:    (!) 80%  Weight:      Height:        Wt Readings from Last 3 Encounters:  01/30/20 105.8 kg  09/16/17 105.7 kg  08/17/17 104.8 kg     Intake/Output Summary (Last 24 hours) at 01/30/2020 1911 Last data filed at 01/30/2020 0256 Gross per 24 hour  Intake 460.86 ml  Output --  Net 460.86 ml   Physical Exam  Physical Examination: General appearance - alert, no acute distress  mental status - alert, oriented to person, place, and time,  Eyes - sclera anicteric Neck - supple, no JVD elevation , Chest - clear  to auscultation bilaterally, symmetrical air movement,  Heart - S1 and S2 normal, regular abdomen - soft, nontender, nondistended, no masses or organomegaly Neurological - screening mental status exam normal, neck supple without rigidity, cranial nerves II through XII intact, DTR's normal and symmetric Extremities - no pedal edema noted, intact peripheral  pulses Skin - -mid lower back with rather large soft cystic mass draining somewhat serous fluid, there is tenderness, slight warmth, no significant erythema or streaking MSK- Lt arm AVF-with positive thrill and bruit   Data Review:   Micro Results Recent Results (from the past 240 hour(s))  Culture, blood (Routine x 2)     Status: None (Preliminary result)   Collection Time: 01/28/20  9:57 AM   Specimen: Right Antecubital; Blood  Result Value Ref Range Status   Specimen Description   Final    RIGHT ANTECUBITAL BOTTLES DRAWN AEROBIC AND ANAEROBIC   Special Requests Blood Culture adequate volume  Final   Culture   Final    NO GROWTH 2 DAYS Performed at [facility], [address] [place], [place] [address]    Report Status PENDING  Incomplete  Culture, blood (Routine x 2)     Status: None (Preliminary result)   Collection Time: 01/28/20  9:57 AM   Specimen: BLOOD RIGHT HAND  Result Value Ref Range Status   Specimen Description BLOOD RIGHT HAND AEROBIC BOTTLE ONLY  Final   Special Requests   Final    Blood Culture results may not be optimal due to an inadequate volume of blood received in culture bottles   Culture   Final    NO GROWTH 2 DAYS Performed at [facility], [address], [place], [place] [address]    Report Status PENDING  Incomplete  Aerobic/Anaerobic Culture (surgical/deep wound)     Status: None (Preliminary result)   Collection Time: 01/28/20 12:49 PM   Specimen: Back; Abscess  Result Value Ref Range Status   Specimen Description   Final    BACK Performed at [facility], [address], [place], [place] [address]    Special Requests   Final    Normal Performed at [place] [place], [place] [address]    Gram Stain   Final    FEW WBC PRESENT, PREDOMINANTLY PMN RARE GRAM POSITIVE COCCI IN PAIRS RARE GRAM POSITIVE RODS    Culture   Final    NO GROWTH 2 DAYS NO ANAEROBES ISOLATED; CULTURE IN PROGRESS FOR 5 DAYS Performed at [facility], [address], [place], [place] [address]  Report Status PENDING  Incomplete  SARS Coronavirus 2 by RT PCR (hospital order, performed in [facility] hospital lab) Nasopharyngeal Nasopharyngeal Swab     Status: None   Collection Time: 01/28/20  1:53 PM   Specimen: Nasopharyngeal Swab  Result Value Ref Range Status   SARS Coronavirus 2 NEGATIVE NEGATIVE Final    Comment: (NOTE) SARS-CoV-2 target nucleic acids are NOT DETECTED.  The SARS-CoV-2 RNA is generally detectable in upper and lower respiratory specimens during the acute phase of infection. The lowest concentration of SARS-CoV-2 viral copies this assay can detect is 250 copies / mL. A negative result does not preclude SARS-CoV-2 infection and should not be used as the sole basis for treatment or other patient management decisions.  A negative result may occur with improper specimen collection / handling, submission of specimen other than nasopharyngeal swab, presence of viral mutation(s) within the areas targeted by this assay, and inadequate number of viral copies (<250 copies / mL). A negative result must be combined with clinical observations, patient history, and epidemiological information.  Fact Sheet for Patients:   StrictlyIdeas.no  Fact Sheet for Healthcare Providers: BankingDealers.co.za  This test is not yet approved or  cleared by the [place] FDA and has been authorized for detection and/or diagnosis of SARS-CoV-2 by FDA under an Emergency Use Authorization (EUA).  This EUA will remain in effect (meaning this test can be used) for the duration of the COVID-19 declaration under Section 564(b)(1) of the Act, 21 U.S.C. section 360bbb-3(b)(1), unless the authorization is terminated or revoked sooner.  Performed at [facility], [address], [place], [place] [address]   MRSA PCR Screening     Status: None   Collection Time: 01/29/20  6:59 PM    Specimen: Nasopharyngeal  Result Value Ref Range Status   MRSA by PCR NEGATIVE NEGATIVE Final    Comment:        The GeneXpert MRSA Assay (FDA approved for NASAL specimens only), is one component of a comprehensive MRSA colonization surveillance program. It is not intended to diagnose MRSA infection nor to guide or monitor treatment for MRSA infections. Performed at [facility], [address], [place], [place] [address]     Radiology Reports DG Chest 2 View  Result Date: 01/28/2020 CLINICAL DATA:  Sepsis. EXAM: CHEST - 2 VIEW COMPARISON:  December 30, 2016. FINDINGS: Stable cardiomediastinal silhouette. No pneumothorax or pleural effusion is noted. Lungs are clear. Bony thorax is unremarkable. IMPRESSION: No active cardiopulmonary disease. Aortic Atherosclerosis (ICD10-I70.0). Electronically Signed   By: Marijo Conception M.D.   On: 01/28/2020 10:54   CT ABDOMEN PELVIS W CONTRAST  Result Date: 01/28/2020 CLINICAL DATA:  Large fungating mass overlying the lumbar spine on the skin for the past 6 months. Clinical concern for infection with abscess. The patient is on dialysis. EXAM: CT ABDOMEN AND PELVIS WITH CONTRAST TECHNIQUE: Multidetector CT imaging of the abdomen and pelvis was performed using the standard protocol following bolus administration of intravenous contrast. CONTRAST:  122mL OMNIPAQUE IOHEXOL 300 MG/ML  SOLN COMPARISON:  None. FINDINGS: Lower chest: Atheromatous calcifications, including the coronary arteries and aorta. Hepatobiliary: No focal liver abnormality is seen. No gallstones, gallbladder wall thickening, or biliary dilatation. Pancreas: Unremarkable. No pancreatic ductal dilatation or surrounding inflammatory changes. Spleen: Normal in size without focal abnormality. Adrenals/Urinary Tract: Mild diffuse bilateral adrenal hyperplasia. Small kidneys. Multiple small rounded low density masses in both kidneys. Some of these have fluid density, compatible with cysts. Others are  slightly higher  than fluid density. The largest of these is in the lower pole of the left kidney, measuring 1.2 cm in maximum diameter and 20 Hounsfield units in density on the delayed images through the kidneys (image number 22 series 7). Poorly distended bladder with mild diffuse wall thickening. Unremarkable ureters. No urinary tract calculi or hydronephrosis. Stomach/Bowel: Small hiatal hernia. Minimal sigmoid colon diverticulosis. Normal appearing appendix. Unremarkable small bowel. Vascular/Lymphatic: Atheromatous arterial calcifications without aneurysm. No enlarged lymph nodes. Reproductive: Moderate Lea enlarged prostate gland. Other: Rounded fluid and gas collection in the subcutaneous fat at the level of the lumbosacral junction, measuring 5.9 x 5.6 x 5.4 cm on image number 48 series 2 and sagittal image number 69. There is mild diffuse surrounding soft tissue thickening with overlying skin thickening. Small to moderate-sized umbilical hernia containing fat. 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 Lumbar and lower thoracic spine degenerative changes. IMPRESSION: 1. 5.9 x 5.6 x 5.4 cm subcutaneous abscess at the level of the lumbosacral junction posteriorly. A necrotic malignancy is less likely. 2. Moderate prostatic hypertrophy. 3. 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 4. Minimal sigmoid colon diverticulosis. 5. Small hiatal hernia. 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 Aortic Atherosclerosis (ICD10-I70.0). Electronically Signed   By: Claudie Revering M.D.   On: 01/28/2020 12:23   ECHOCARDIOGRAM COMPLETE  Result Date: 01/30/2020    ECHOCARDIOGRAM REPORT   Patient Name:   KIMMIE DOREN Date of Exam: 01/30/2020 Medical Rec #:  [id number]      Height:       70.0 in Accession #:    [id number]     Weight:        233.2 lb Date of Birth:  31-Jan-1946      BSA:          2.228 m Patient Age:    8 years       BP:           134/54 mmHg Patient Gender: M              HR:           75 bpm. Exam Location:  [facility] Procedure: 2D Echo, Cardiac Doppler and Color Doppler Indications:    Abnormal ECG 794.31 / R94.31  History:        Patient has no prior history of Echocardiogram examinations.                 Arrythmias:Atrial Flutter; Risk Factors:Hypertension and                 Diabetes. ESRD on dialysis.  Sonographer:    Alvino Chapel RCS Referring Phys: [phone] Casie Sturgeon IMPRESSIONS  1. Left ventricular ejection fraction, by estimation, is 60 to 65%. The left ventricle has normal function. The left ventricle has no regional wall motion abnormalities. There is mild left ventricular hypertrophy. Left ventricular diastolic parameters are indeterminate.  2. Right ventricular systolic function is normal. The right ventricular size is normal. There is normal pulmonary artery systolic pressure.  3. Left atrial size was mildly dilated.  4. The mitral valve is normal in structure. No evidence of mitral valve regurgitation. No evidence of mitral stenosis.  5. The aortic valve is tricuspid. Aortic valve regurgitation is not visualized. Mild to moderate aortic valve stenosis.  6. The inferior vena cava is normal in size with greater than 50% respiratory variability, suggesting  right atrial pressure of 3 mmHg. FINDINGS  Left Ventricle: Left ventricular ejection fraction, by estimation, is 60 to 65%. The left ventricle has normal function. The left ventricle has no regional wall motion abnormalities. The left ventricular internal cavity size was normal in size. There is  mild left ventricular hypertrophy. Left ventricular diastolic parameters are indeterminate. Right Ventricle: The right ventricular size is normal. No increase in right ventricular wall thickness. Right ventricular systolic function is normal. There is normal pulmonary  artery systolic pressure. The tricuspid regurgitant velocity is 2.53 m/s, and  with an assumed right atrial pressure of 3 mmHg, the estimated right ventricular systolic pressure is 66.2 mmHg. Left Atrium: Left atrial size was mildly dilated. Right Atrium: Right atrial size was normal in size. Pericardium: There is no evidence of pericardial effusion. Mitral Valve: The mitral valve is normal in structure. No evidence of mitral valve regurgitation. No evidence of mitral valve stenosis. Tricuspid Valve: The tricuspid valve is normal in structure. Tricuspid valve regurgitation is mild . No evidence of tricuspid stenosis. Aortic Valve: The aortic valve is tricuspid. . There is severe thickening and severe calcifcation of the aortic valve. Aortic valve regurgitation is not visualized. Mild to moderate aortic stenosis is present. Severe aortic valve annular calcification. There is severe thickening of the aortic valve. There is severe calcifcation of the aortic valve. Aortic valve mean gradient measures 15.0 mmHg. Aortic valve peak gradient measures 29.4 mmHg. Aortic valve area, by VTI measures 1.38 cm. Pulmonic Valve: The pulmonic valve was not well visualized. Pulmonic valve regurgitation is not visualized. No evidence of pulmonic stenosis. Aorta: The aortic root is normal in size and structure. Pulmonary Artery: Indeterminate PASP, inadequate TR jet. Venous: The inferior vena cava is normal in size with greater than 50% respiratory variability, suggesting right atrial pressure of 3 mmHg. IAS/Shunts: No atrial level shunt detected by color flow Doppler.  LEFT VENTRICLE PLAX 2D LVIDd:         3.97 cm  Diastology LVIDs:         2.27 cm  LV e' lateral:   11.50 cm/s LV PW:         1.12 cm  LV E/e' lateral: 8.9 LV IVS:        1.07 cm  LV e' medial:    6.64 cm/s LVOT diam:     1.90 cm  LV E/e' medial:  15.4 LV SV:         82 LV SV Index:   37 LVOT Area:     2.84 cm  RIGHT VENTRICLE RV S prime:     11.90 cm/s TAPSE (M-mode):  2.1 cm LEFT ATRIUM             Index       RIGHT ATRIUM           Index LA diam:        3.30 cm 1.48 cm/m  RA Area:     24.30 cm LA Vol (A2C):   76.3 ml 34.24 ml/m RA Volume:   77.00 ml  34.56 ml/m LA Vol (A4C):   59.0 ml 26.48 ml/m LA Biplane Vol: 66.9 ml 30.02 ml/m  AORTIC VALVE AV Area (Vmax):    1.23 cm AV Area (Vmean):   1.49 cm AV Area (VTI):     1.38 cm AV Vmax:           271.00 cm/s AV Vmean:          180.000 cm/s AV VTI:  0.592 m AV Peak Grad:      29.4 mmHg AV Mean Grad:      15.0 mmHg LVOT Vmax:         118.00 cm/s LVOT Vmean:        94.600 cm/s LVOT VTI:          0.289 m LVOT/AV VTI ratio: 0.49  AORTA Ao Root diam: 3.30 cm MITRAL VALVE                TRICUSPID VALVE MV Area (PHT): 3.12 cm     TR Peak grad:   25.6 mmHg MV Decel Time: 243 msec     TR Vmax:        253.00 cm/s MV E velocity: 102.00 cm/s MV A velocity: 76.30 cm/s   SHUNTS MV E/A ratio:  1.34         Systemic VTI:  0.29 m                             Systemic Diam: 1.90 cm Carlyle Dolly MD Electronically signed by Carlyle Dolly MD Signature Date/Time: 01/30/2020/4:12:42 PM    Final    OCT, Retina - OU - Both Eyes  Result Date: 01/25/2020 Right Eye Quality was good. Scan locations included subfoveal. Central Foveal Thickness: 554. Progression has improved. Findings include abnormal foveal contour. Left Eye Quality was good. Scan locations included subfoveal. Notes OD, much less retinal thickening status post vitrectomy, membrane peel    CBC Recent Labs  Lab 01/28/20 0957 01/29/20 0616 01/29/20 1902 01/30/20 0532  WBC 18.3* 19.9* 16.3* 14.5*  HGB 9.4* 9.5* 9.6* 8.9*  HCT 30.8* 31.0* 31.2* 29.1*  PLT 257 287 257 254  MCV 102.7* 101.6* 103.3* 101.0*  MCH 31.3 31.1 31.8 30.9  MCHC 30.5 30.6 30.8 30.6  RDW 13.7 13.9 13.9 13.8  LYMPHSABS 1.5  --   --   --   MONOABS 1.8*  --   --   --   EOSABS 0.3  --   --   --   BASOSABS 0.1  --   --   --     Chemistries  Recent Labs  Lab 01/28/20 0957  01/29/20 0616 01/29/20 1902 01/30/20 0532  NA 137 137 133* 134*  K 3.9 4.3 4.3 4.0  CL 93* 92* 92* 92*  CO2 29 28 27 28   GLUCOSE 277* 251* 252* 183*  BUN 49* 57* 29* 37*  CREATININE 10.78* 12.01* 7.10* 8.09*  CALCIUM 8.4* 8.2* 8.2* 8.1*  MG  --   --  1.7  --   AST 20  --  18  --   ALT 14  --  13  --   ALKPHOS 122  --  96  --   BILITOT 0.6  --  0.8  --    ------------------------------------------------------------------------------------------------------------------ No results for input(s): CHOL, HDL, LDLCALC, TRIG, CHOLHDL, LDLDIRECT in the last 72 hours.  Lab Results  Component Value Date   HGBA1C 6.1 (H) 01/28/2020   ------------------------------------------------------------------------------------------------------------------ Recent Labs    01/29/20 1904  TSH 0.837   ------------------------------------------------------------------------------------------------------------------ No results for input(s): VITAMINB12, FOLATE, FERRITIN, TIBC, IRON, RETICCTPCT in the last 72 hours.  Coagulation profile Recent Labs  Lab 01/28/20 0957  INR 1.3*    No results for input(s): DDIMER in the last 72 hours.  Cardiac Enzymes No results for input(s): CKMB, TROPONINI, MYOGLOBIN in the last 168 hours.  Invalid input(s): CK ------------------------------------------------------------------------------------------------------------------ No results found for:  BNP   Roxan Hockey M.D on 01/30/2020 at 7:11 PM  Go to [email or web address] - for contact info  [facility] - Office  [phone]

## 2020-01-30 NOTE — Progress Notes (Signed)
ANTICOAGULATION CONSULT NOTE - Initial Consult  Pharmacy Consult for Eliquis Indication: atrial fibrillation  No Known Allergies  Patient Measurements: Height: 5\' 10"  (177.8 cm) Weight: 105.8 kg (233 lb 4 oz) IBW/kg (Calculated) : 73  Vital Signs: Temp: 98.4 F (36.9 C) (07/20 0700) Temp Source: Oral (07/20 0400) BP: 109/34 (07/20 0830) Pulse Rate: 122 (07/20 0515)  Labs: Recent Labs    01/28/20 0957 01/28/20 0957 01/29/20 0616 01/29/20 0616 01/29/20 1902 01/29/20 2102 01/30/20 0532  HGB 9.4*   < > 9.5*   < > 9.6*  --  8.9*  HCT 30.8*   < > 31.0*  --  31.2*  --  29.1*  PLT 257   < > 287  --  257  --  254  APTT 41*  --   --   --   --   --   --   LABPROT 16.1*  --   --   --   --   --   --   INR 1.3*  --   --   --   --   --   --   CREATININE 10.78*   < > 12.01*  --  7.10*  --  8.09*  TROPONINIHS  --   --   --   --  13 12  --    < > = values in this interval not displayed.    Estimated Creatinine Clearance: 9.6 mL/min (A) (by C-G formula based on SCr of 8.09 mg/dL (H)).   Medical History: Past Medical History:  Diagnosis Date  . Anemia   . Anemia in chronic kidney disease 11/20/2015  . Cancer [facility]) 2009   Prostate; radiation seeds  . Chronic kidney disease    stage 4  . Diabetes mellitus   . Gout   . Hypertension     Medications:  Medications Prior to Admission  Medication Sig Dispense Refill Last Dose  . allopurinol (ZYLOPRIM) 300 MG tablet Take 300 mg by mouth daily.   01/28/2020 at Unknown time  . atorvastatin (LIPITOR) 40 MG tablet Take 40 mg by mouth daily.    01/28/2020 at Unknown time  . ELIQUIS 5 MG TABS tablet Take 5 mg by mouth 2 (two) times daily.   01/28/2020 at 0800time  . metoprolol tartrate (LOPRESSOR) 100 MG tablet Take 100 mg by mouth 2 (two) times daily.   01/28/2020 at 0800  . omeprazole (PRILOSEC) 20 MG capsule Take 20 mg by mouth every morning.    01/28/2020 at Unknown time    Assessment: Patient on eliquis PTA for afib, 5mg  po bid. 74  yo, 106kg, Scr 8. No bleeding noted. Cardiology asked to resume.  Goal of Therapy:  Monitor platelets by anticoagulation protocol: Yes   Plan:  Eliquis 5mg  po bid Monitor for S/S of bleeding  Isac Sarna, BS Vena Austria, BCPS Clinical Pharmacist Pager [phone] 01/30/2020,10:51 AM

## 2020-01-30 NOTE — Progress Notes (Signed)
*  PRELIMINARY RESULTS* Echocardiogram 2D Echocardiogram has been performed.  Justin [name] 01/30/2020, 12:28 PM

## 2020-01-30 NOTE — Progress Notes (Signed)
Inpatient Diabetes Program Recommendations  AACE/ADA: New Consensus Statement on Inpatient Glycemic Control   Target Ranges:  Prepandial:   less than 140 mg/dL      Peak postprandial:   less than 180 mg/dL (1-2 hours)      Critically ill patients:  140 - 180 mg/dL   Results for Justin [name], Justin [name] (MRN [id number]) as of 01/30/2020 10:33  Ref. Range 01/29/2020 07:15 01/29/2020 11:16 01/29/2020 16:35 01/29/2020 21:15 01/30/2020 07:07  Glucose-Capillary Latest Ref Range: 70 - 99 mg/dL 234 (H)  Novolog 4 units 251 (H) 164 (H)  Novolog 3 units 266 (H)  Novolog 3 units  Lantus 10 units 155 (H)  Novolog 3 units   Review of Glycemic Control  Diabetes history: DM2 Outpatient Diabetes medications: None Current orders for Inpatient glycemic control: Lantus 10 units QHS, Novolog 0-6 units TID with meals, Novolog 0-5 units QHS, Novolog 2 units TID with meals  Inpatient Diabetes Program Recommendations:    Insulin-Meal Coverage: Please consider increasing meal coverage to Novolog 4 units TID with meals if patient eats at least 50% of meals.  HbgA1C:  A1C 6.1% on 01/28/20 indicating an average glucose of 128 mg/dl over the past 2-3 months.  Thanks, Barnie Alderman, RN, MSN, CDE Diabetes Coordinator Inpatient Diabetes Program [phone] (Team Pager from 8am to 5pm)

## 2020-01-31 DIAGNOSIS — N189 Chronic kidney disease, unspecified: Secondary | ICD-10-CM

## 2020-01-31 DIAGNOSIS — A419 Sepsis, unspecified organism: Secondary | ICD-10-CM

## 2020-01-31 DIAGNOSIS — I483 Typical atrial flutter: Secondary | ICD-10-CM

## 2020-01-31 DIAGNOSIS — L0291 Cutaneous abscess, unspecified: Secondary | ICD-10-CM

## 2020-01-31 DIAGNOSIS — D631 Anemia in chronic kidney disease: Secondary | ICD-10-CM

## 2020-01-31 DIAGNOSIS — I159 Secondary hypertension, unspecified: Secondary | ICD-10-CM

## 2020-01-31 DIAGNOSIS — R6521 Severe sepsis with septic shock: Secondary | ICD-10-CM

## 2020-01-31 LAB — GLUCOSE, CAPILLARY
Glucose-Capillary: 148 mg/dL — ABNORMAL HIGH (ref 70–99)
Glucose-Capillary: 150 mg/dL — ABNORMAL HIGH (ref 70–99)
Glucose-Capillary: 157 mg/dL — ABNORMAL HIGH (ref 70–99)
Glucose-Capillary: 140 mg/dL — ABNORMAL HIGH (ref 70–99)

## 2020-01-31 NOTE — Consult Note (Addendum)
Justin [name] Admit Date: 01/28/2020 01/31/2020 Justin [name] Requesting Physician:  Denton Brick MD  Reason for Consult:  ESRD comanagement Interval history:  Has had episode of A. fib with RVR that is now resolved.  Patient is feeling well today without specific complaints  Creatinine, Ser (mg/dL)  Date Value  01/30/2020 8.09 (H)  01/29/2020 7.10 (H)  01/29/2020 12.01 (H)  01/28/2020 10.78 (H)  08/03/2016 5.36 (H)  10/16/2015 4.61 (H)  05/24/2015 4.12 (H)  02/28/2015 4.37 (H)  07/11/2011 2.12 (H)  07/10/2011 2.13 (H)  ]  ROS Balance of 12 systems is negative w/ exceptions as above Current Medications Scheduled Meds: . allopurinol  100 mg Oral BH-q7a  . allopurinol  300 mg Oral Daily  . apixaban  5 mg Oral BID  . aspirin EC  81 mg Oral BH-q7a  . atorvastatin  40 mg Oral Daily  . Chlorhexidine Gluconate Cloth  6 each Topical Daily  . collagenase   Topical UD  . doxercalciferol  12 mcg Intravenous Q M,W,F-HD  . insulin aspart  0-5 Units Subcutaneous QHS  . insulin aspart  0-6 Units Subcutaneous TID WC  . insulin aspart  2 Units Subcutaneous TID WC  . insulin glargine  10 Units Subcutaneous QHS  . lidocaine (PF)  5 mL Other Once  . metoprolol tartrate  25 mg Oral BID  . multivitamin with minerals  1 tablet Oral Daily  . ofloxacin  1 drop Right Eye QID  . pantoprazole  40 mg Oral Daily  . potassium chloride  10 mEq Oral Daily  . prednisoLONE acetate  1 drop Right Eye QID  . sodium chloride flush  3 mL Intravenous Q12H  . sucroferric oxyhydroxide  500 mg Oral TID   Continuous Infusions: . sodium chloride Stopped (01/29/20 2220)  . sodium chloride 100 mL (01/30/20 0019)  . sodium chloride    . ceFEPime (MAXIPIME) IV Stopped (01/30/20 1451)  . vancomycin Stopped (01/29/20 1800)   PRN Meds:.sodium chloride, sodium chloride, sodium chloride, acetaminophen **OR** acetaminophen, albuterol, cyclobenzaprine, HYDROcodone-acetaminophen, HYDROmorphone (DILAUDID) injection,  labetalol, lidocaine (PF), lidocaine-prilocaine, ondansetron **OR** ondansetron (ZOFRAN) IV, oxyCODONE, pentafluoroprop-tetrafluoroeth, polyethylene glycol, sodium chloride flush, traMADol, traZODone  CBC Recent Labs  Lab 01/28/20 0957 01/28/20 0957 01/29/20 0616 01/29/20 1902 01/30/20 0532  WBC 18.3*   < > 19.9* 16.3* 14.5*  NEUTROABS 14.5*  --   --   --   --   HGB 9.4*   < > 9.5* 9.6* 8.9*  HCT 30.8*   < > 31.0* 31.2* 29.1*  MCV 102.7*   < > 101.6* 103.3* 101.0*  PLT 257   < > 287 257 254   < > = values in this interval not displayed.   Basic Metabolic Panel Recent Labs  Lab 01/28/20 0957 01/29/20 0616 01/29/20 1902 01/30/20 0532  NA 137 137 133* 134*  K 3.9 4.3 4.3 4.0  CL 93* 92* 92* 92*  CO2 29 28 27 28   GLUCOSE 277* 251* 252* 183*  BUN 49* 57* 29* 37*  CREATININE 10.78* 12.01* 7.10* 8.09*  CALCIUM 8.4* 8.2* 8.2* 8.1*    Physical Exam  Blood pressure 133/74, pulse 74, temperature 98.2 F (36.8 C), temperature source Oral, resp. rate 10, height 5\' 10"  (1.778 m), weight 106.7 kg, SpO2 100 %. 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 ESRD LUE AVF MWF 2. Subcutaneous abscess: Vanc/Cefepime, drained.  Gen surg following 3. Anemia, mildly decreased to 8.9.  Continue to monitor 4. CKD0-BMD: Ca ok 5. Afib with RVR: now resolved. Cardiology assistance appreciated 6. HTN: BP stable  Plan 1. HD today: 2K, Ca 2.5, 3.5h, 1-2L UF, No heparin 2. Will follow along 3. Daily weights, Daily Renal Panel, Strict I/Os, Avoid nephrotoxins (NSAIDs, judicious IV Contrast)    Justin [name]  01/31/2020, 9:53 AM

## 2020-01-31 NOTE — Progress Notes (Addendum)
     Please refer to full consult note from 01/30/2020. Telemetry reviewed and he has maintained NSR with no recurrent atrial flutter. He lhas been in NSR with HR mostly in the 60's to 70's. Episodes of nocturnal bradycardia noted and pauses up to 1.8 seconds. He would benefit from an outpatient sleep study to rule-out OSA. Remains on Lopressor and Eliquis for this atrial flutter.   Echocardiogram showed a preserved EF of 60-65% with mild LVH and mild to moderate aortic stenosis. This will need to be followed by his Primary Cardiologist in [place], [place]. No indication for further cardiac testing this admission.   [facility] will sign off.   Medication Recommendations: As above.  Other recommendations (labs, testing, etc):  None Follow up as an outpatient:  With Primary Cardiologist in [place], [place], Erma Heritage, PA-C 01/31/2020, 7:52 AM Pager: [phone]  Patient examined chart reviewed Exam with SEM mild AS agree with telemetry review above in NSR continue DOAC and outpatient f/u in [facility]

## 2020-01-31 NOTE — Progress Notes (Signed)
Patient Demographics:    Justin [name], is a 74 y.o. male, DOB - [birth year], AYT:[id number]  Admit date - 01/28/2020   Admitting Physician Courage Denton Brick, MD  Outpatient Primary MD for the patient is [name], [name], MD  LOS - 2   Chief Complaint  Patient presents with   Abscess        Subjective:    Justin [name] today has no fevers, no emesis,  No chest pain,   - -No significant dizziness, no palpitations or shortness of breath at this time -BP Remains pretty soft    Assessment  & Plan :    Principal Problem:   Wound infection Active Problems:   HTN (hypertension)   DM (diabetes mellitus) ([place])   Anemia in chronic kidney disease   ESRD on dialysis [facility])   Abscess   Atrial flutter ([place])  Brief Summary:-  74 y.o. male reformed smoker with past medical history relevant for ESRD, getting HD on Mondays Wednesdays and Fridays, DM 2,HTN, anemia of ESRD, chronic anticoagulation with apixaban admitted on 01/28/2020 with lumbar area abscess/cellulitis, subsequently patient developed atrial flutter with hypotension after completing hemodialysis on 01/29/2020 -Converted back to sinus rhythm in the early hours of 01/30/2020, off Cardizem drip at this time   A/p 1) atrial flutter with RVR and hemodynamic concerns--patient developed tachycardia after hemodialysis, received IV metoprolol tachycardia persisted, transferred to stepdown unit for IV Cardize with good results, now resolved and cardiology signed off 7/21.  -Converted back to sinus rhythm in the early hours of 01/30/2020, off Cardizem drip  Continue  Eliquis for full anticoagulation -Echocardiogram with EF of 60 to 65%, unable to determine diagnostic parameters -TSH is 0.83 -Potassium is 4.0, mag noted -Troponin not elevated -Blood pressure soft, hold torsemide, hold amlodipine, hold hydralazine and hold losartan in order to allow for  titration of  metoprolol for rate control in the setting of atrial flutter with RVR   2)Lumbar Area Abscess/Cellulitis/5.9 x 5.6 x 5.4 cm subcutaneous Abscess--as per patient lumbar area  cystic lesion/mass has been present for at least 2 years, it  got bigger over the last 6 months and then started draining just within the last 24 hours prior to admission Does Not meet Sepsis  - IV vancomycin as per cellulitis order set, cefepime added -General surgery consulted, discussed with Dr. Arnoldo Morale, no immediate need for surgery at this time -Await culture data from swab obtained by ED provider--  --preliminary Gram stain/culture with- Gram-positive cocci and rods -CT abdomen and pelvis demonstrating abscess as noted, fortunately it is draining.    3) ESRD--- gets HD through left arm AV fistula Mondays Wednesdays and Fridays -Last HD session on 01/29/2020 -Nephrology consult appreciated  4)DM2-patient with diabetic nephropathy resume home insulin regimen Use Novolog/Humalog Sliding scale insulin with Accu-Cheks/Fingersticks as ordered   CBG (last 3)  Recent Labs    01/30/20 2202 01/31/20 0759 01/31/20 1119  GLUCAP 187* 148* 150*    4)Anemia of ESRD--Hgb stable at 8.9 ESA/Procrit per nephrology team  5)Chronic anticoagulation---resumed apixaban 5 mg BID  6)HTN--stable, -Blood pressure soft, hold torsemide, hold amlodipine, hold hydralazine and hold losartan in order to allow for titration of Cardizem and metoprolol for rate control in the setting of atrial flutter with  RVR, c/n Coreg 25 mg twice daily     Disposition/Need for in-Hospital Stay- patient unable to be discharged at this time due to -- soft BP, lumbar area abscess requiring iv abx, atrial flutter requiring further rate control  Dispo: The patient is from: Home  Anticipated d/c is to: Home  Anticipated d/c date is: 1-2 days  Patient currently is not medically stable to d/c. Barriers:  Not Clinically Stable- - lumbar area abscess requiring iv abx  Code Status : full  Family Communication:   (patient is alert, awake and coherent)   Consults  :  gen surge/Nephrology/cardiio  DVT Prophylaxis  : Eliquis Lab Results  Component Value Date   PLT 254 01/30/2020    Inpatient Medications  Scheduled Meds:  allopurinol  100 mg Oral BH-q7a   allopurinol  300 mg Oral Daily   apixaban  5 mg Oral BID   aspirin EC  81 mg Oral BH-q7a   atorvastatin  40 mg Oral Daily   Chlorhexidine Gluconate Cloth  6 each Topical Daily   collagenase   Topical UD   doxercalciferol  12 mcg Intravenous Q M,W,F-HD   insulin aspart  0-5 Units Subcutaneous QHS   insulin aspart  0-6 Units Subcutaneous TID WC   insulin aspart  2 Units Subcutaneous TID WC   insulin glargine  10 Units Subcutaneous QHS   lidocaine (PF)  5 mL Other Once   metoprolol tartrate  25 mg Oral BID   multivitamin with minerals  1 tablet Oral Daily   ofloxacin  1 drop Right Eye QID   pantoprazole  40 mg Oral Daily   potassium chloride  10 mEq Oral Daily   prednisoLONE acetate  1 drop Right Eye QID   sodium chloride flush  3 mL Intravenous Q12H   sucroferric oxyhydroxide  500 mg Oral TID   Continuous Infusions:  sodium chloride Stopped (01/29/20 2220)   sodium chloride 100 mL (01/30/20 0019)   sodium chloride     ceFEPime (MAXIPIME) IV Stopped (01/30/20 1451)   vancomycin Stopped (01/29/20 1800)   PRN Meds:.sodium chloride, sodium chloride, sodium chloride, acetaminophen **OR** acetaminophen, albuterol, cyclobenzaprine, HYDROcodone-acetaminophen, HYDROmorphone (DILAUDID) injection, labetalol, lidocaine (PF), lidocaine-prilocaine, ondansetron **OR** ondansetron (ZOFRAN) IV, oxyCODONE, pentafluoroprop-tetrafluoroeth, polyethylene glycol, sodium chloride flush, traMADol, traZODone    Anti-infectives (From admission, onward)   Start     Dose/Rate Route Frequency Ordered Stop   01/29/20 1800   vancomycin (VANCOCIN) IVPB 1000 mg/200 mL premix     Discontinue     1,000 mg 200 mL/hr over 60 Minutes Intravenous Every M-W-F (1800) 01/28/20 2138     01/28/20 1730  vancomycin (VANCOCIN) IVPB 1000 mg/200 mL premix  Status:  Discontinued        1,000 mg 200 mL/hr over 60 Minutes Intravenous  Once 01/28/20 1729 01/28/20 1738   01/28/20 1200  vancomycin (VANCOREADY) IVPB 2000 mg/400 mL        2,000 mg 200 mL/hr over 120 Minutes Intravenous  Once 01/28/20 1115 01/28/20 1308   01/28/20 1200  piperacillin-tazobactam (ZOSYN) IVPB 2.25 g  Status:  Discontinued        2.25 g 100 mL/hr over 30 Minutes Intravenous Every 8 hours 01/28/20 1117 01/28/20 1126   01/28/20 1130  ceFEPIme (MAXIPIME) 1 g in sodium chloride 0.9 % 100 mL IVPB     Discontinue     1 g 200 mL/hr over 30 Minutes Intravenous Every 24 hours 01/28/20 1126         Objective:  Vitals:   01/31/20 0800 01/31/20 0802 01/31/20 0900 01/31/20 1120  BP:   133/74   Pulse: 81  74 79  Resp: (!) 27  10 (!) 21  Temp:  98.2 F (36.8 C)  97.7 F (36.5 C)  TempSrc:  Oral  Oral  SpO2: 98%  100% 100%  Weight:      Height:       Wt Readings from Last 3 Encounters:  01/31/20 106.7 kg  09/16/17 105.7 kg  08/17/17 104.8 kg    Intake/Output Summary (Last 24 hours) at 01/31/2020 1122 Last data filed at 01/31/2020 0800 Gross per 24 hour  Intake 560 ml  Output --  Net 560 ml   Physical Exam  Physical Examination: General appearance - alert, no acute distress  mental status - alert, oriented to person, place, and time,  Eyes - sclera anicteric Neck - supple, no JVD elevation , Chest - clear  to auscultation bilaterally, symmetrical air movement,  Heart - S1 and S2 normal, regular abdomen - soft, nontender, nondistended, no masses or organomegaly Neurological - screening mental status exam normal, neck supple without rigidity, cranial nerves II through XII intact, DTR's normal and symmetric Extremities - no pedal edema noted, intact  peripheral pulses Skin - -mid lower back with rather large soft cystic mass draining purulent fluid, there is mild tenderness, slight warmth, no significant erythema or streaking MSK- Lt arm AVF-with positive thrill and bruit   Data Review:   Micro Results Recent Results (from the past 240 hour(s))  Culture, blood (Routine x 2)     Status: None (Preliminary result)   Collection Time: 01/28/20  9:57 AM   Specimen: Right Antecubital; Blood  Result Value Ref Range Status   Specimen Description   Final    RIGHT ANTECUBITAL BOTTLES DRAWN AEROBIC AND ANAEROBIC   Special Requests Blood Culture adequate volume  Final   Culture   Final    NO GROWTH 3 DAYS Performed at [facility], [address], [place], [place] [address]    Report Status PENDING  Incomplete  Culture, blood (Routine x 2)     Status: None (Preliminary result)   Collection Time: 01/28/20  9:57 AM   Specimen: BLOOD RIGHT HAND  Result Value Ref Range Status   Specimen Description BLOOD RIGHT HAND AEROBIC BOTTLE ONLY  Final   Special Requests   Final    Blood Culture results may not be optimal due to an inadequate volume of blood received in culture bottles   Culture   Final    NO GROWTH 3 DAYS Performed at [facility], [address] [place], [place] [address]    Report Status PENDING  Incomplete  Aerobic/Anaerobic Culture (surgical/deep wound)     Status: None (Preliminary result)   Collection Time: 01/28/20 12:49 PM   Specimen: Back; Abscess  Result Value Ref Range Status   Specimen Description   Final    BACK Performed at [facility], [address], [place], [place] [address]    Special Requests   Final    Normal Performed at [place] [place], [place] [address]    Gram Stain   Final    FEW WBC PRESENT, PREDOMINANTLY PMN RARE GRAM POSITIVE COCCI IN PAIRS RARE GRAM POSITIVE RODS    Culture   Final    NO GROWTH 3 DAYS NO ANAEROBES ISOLATED; CULTURE IN PROGRESS FOR 5 DAYS Performed at [facility], [address] [place], [place] [address]  Report Status PENDING  Incomplete  SARS Coronavirus 2 by RT PCR (hospital order, performed in [facility] hospital lab) Nasopharyngeal Nasopharyngeal Swab     Status: None   Collection Time: 01/28/20  1:53 PM   Specimen: Nasopharyngeal Swab  Result Value Ref Range Status   SARS Coronavirus 2 NEGATIVE NEGATIVE Final    Comment: (NOTE) SARS-CoV-2 target nucleic acids are NOT DETECTED.  The SARS-CoV-2 RNA is generally detectable in upper and lower respiratory specimens during the acute phase of infection. The lowest concentration of SARS-CoV-2 viral copies this assay can detect is 250 copies / mL. A negative result does not preclude SARS-CoV-2 infection and should not be used as the sole basis for treatment or other patient management decisions.  A negative result may occur with improper specimen collection / handling, submission of specimen other than nasopharyngeal swab, presence of viral mutation(s) within the areas targeted by this assay, and inadequate number of viral copies (<250 copies / mL). A negative result must be combined with clinical observations, patient history, and epidemiological information.  Fact Sheet for Patients:   StrictlyIdeas.no  Fact Sheet for Healthcare Providers: BankingDealers.co.za  This test is not yet approved or  cleared by the [place] FDA and has been authorized for detection and/or diagnosis of SARS-CoV-2 by FDA under an Emergency Use Authorization (EUA).  This EUA will remain in effect (meaning this test can be used) for the duration of the COVID-19 declaration under Section 564(b)(1) of the Act, 21 U.S.C. section 360bbb-3(b)(1), unless the authorization is terminated or revoked sooner.  Performed at [facility], [address] [place], [place] [address]   MRSA PCR Screening     Status: None   Collection Time: 01/29/20  6:59 PM    Specimen: Nasopharyngeal  Result Value Ref Range Status   MRSA by PCR NEGATIVE NEGATIVE Final    Comment:        The GeneXpert MRSA Assay (FDA approved for NASAL specimens only), is one component of a comprehensive MRSA colonization surveillance program. It is not intended to diagnose MRSA infection nor to guide or monitor treatment for MRSA infections. Performed at [facility], [address], [place], [place] [address]     Radiology Reports DG Chest 2 View  Result Date: 01/28/2020 CLINICAL DATA:  Sepsis. EXAM: CHEST - 2 VIEW COMPARISON:  December 30, 2016. FINDINGS: Stable cardiomediastinal silhouette. No pneumothorax or pleural effusion is noted. Lungs are clear. Bony thorax is unremarkable. IMPRESSION: No active cardiopulmonary disease. Aortic Atherosclerosis (ICD10-I70.0). Electronically Signed   By: Marijo Conception M.D.   On: 01/28/2020 10:54   CT ABDOMEN PELVIS W CONTRAST  Result Date: 01/28/2020 CLINICAL DATA:  Large fungating mass overlying the lumbar spine on the skin for the past 6 months. Clinical concern for infection with abscess. The patient is on dialysis. EXAM: CT ABDOMEN AND PELVIS WITH CONTRAST TECHNIQUE: Multidetector CT imaging of the abdomen and pelvis was performed using the standard protocol following bolus administration of intravenous contrast. CONTRAST:  121mL OMNIPAQUE IOHEXOL 300 MG/ML  SOLN COMPARISON:  None. FINDINGS: Lower chest: Atheromatous calcifications, including the coronary arteries and aorta. Hepatobiliary: No focal liver abnormality is seen. No gallstones, gallbladder wall thickening, or biliary dilatation. Pancreas: Unremarkable. No pancreatic ductal dilatation or surrounding inflammatory changes. Spleen: Normal in size without focal abnormality. Adrenals/Urinary Tract: Mild diffuse bilateral adrenal hyperplasia. Small kidneys. Multiple small rounded low density masses in both kidneys. Some of these have fluid density, compatible with cysts. Others are  slightly higher  than fluid density. The largest of these is in the lower pole of the left kidney, measuring 1.2 cm in maximum diameter and 20 Hounsfield units in density on the delayed images through the kidneys (image number 22 series 7). Poorly distended bladder with mild diffuse wall thickening. Unremarkable ureters. No urinary tract calculi or hydronephrosis. Stomach/Bowel: Small hiatal hernia. Minimal sigmoid colon diverticulosis. Normal appearing appendix. Unremarkable small bowel. Vascular/Lymphatic: Atheromatous arterial calcifications without aneurysm. No enlarged lymph nodes. Reproductive: Moderate Lea enlarged prostate gland. Other: Rounded fluid and gas collection in the subcutaneous fat at the level of the lumbosacral junction, measuring 5.9 x 5.6 x 5.4 cm on image number 48 series 2 and sagittal image number 69. There is mild diffuse surrounding soft tissue thickening with overlying skin thickening. Small to moderate-sized umbilical hernia containing fat. 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 Lumbar and lower thoracic spine degenerative changes. IMPRESSION: 1. 5.9 x 5.6 x 5.4 cm subcutaneous abscess at the level of the lumbosacral junction posteriorly. A necrotic malignancy is less likely. 2. Moderate prostatic hypertrophy. 3. 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 4. Minimal sigmoid colon diverticulosis. 5. Small hiatal hernia. 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 Aortic Atherosclerosis (ICD10-I70.0). Electronically Signed   By: Claudie Revering M.D.   On: 01/28/2020 12:23   ECHOCARDIOGRAM COMPLETE  Result Date: 01/30/2020    ECHOCARDIOGRAM REPORT   Patient Name:   Justin [name] Date of Exam: 01/30/2020 Medical Rec #:  [id number]      Height:       70.0 in Accession #:    [id number]     Weight:        233.2 lb Date of Birth:  06/27/1946      BSA:          2.228 m Patient Age:    49 years       BP:           134/54 mmHg Patient Gender: M              HR:           75 bpm. Exam Location:  [facility] Procedure: 2D Echo, Cardiac Doppler and Color Doppler Indications:    Abnormal ECG 794.31 / R94.31  History:        Patient has no prior history of Echocardiogram examinations.                 Arrythmias:Atrial Flutter; Risk Factors:Hypertension and                 Diabetes. ESRD on dialysis.  Sonographer:    Alvino Chapel RCS Referring Phys: [phone] COURAGE EMOKPAE IMPRESSIONS  1. Left ventricular ejection fraction, by estimation, is 60 to 65%. The left ventricle has normal function. The left ventricle has no regional wall motion abnormalities. There is mild left ventricular hypertrophy. Left ventricular diastolic parameters are indeterminate.  2. Right ventricular systolic function is normal. The right ventricular size is normal. There is normal pulmonary artery systolic pressure.  3. Left atrial size was mildly dilated.  4. The mitral valve is normal in structure. No evidence of mitral valve regurgitation. No evidence of mitral stenosis.  5. The aortic valve is tricuspid. Aortic valve regurgitation is not visualized. Mild to moderate aortic valve stenosis.  6. The inferior vena cava is normal in size with greater than 50% respiratory variability, suggesting  right atrial pressure of 3 mmHg. FINDINGS  Left Ventricle: Left ventricular ejection fraction, by estimation, is 60 to 65%. The left ventricle has normal function. The left ventricle has no regional wall motion abnormalities. The left ventricular internal cavity size was normal in size. There is  mild left ventricular hypertrophy. Left ventricular diastolic parameters are indeterminate. Right Ventricle: The right ventricular size is normal. No increase in right ventricular wall thickness. Right ventricular systolic function is normal. There is normal pulmonary  artery systolic pressure. The tricuspid regurgitant velocity is 2.53 m/s, and  with an assumed right atrial pressure of 3 mmHg, the estimated right ventricular systolic pressure is 69.4 mmHg. Left Atrium: Left atrial size was mildly dilated. Right Atrium: Right atrial size was normal in size. Pericardium: There is no evidence of pericardial effusion. Mitral Valve: The mitral valve is normal in structure. No evidence of mitral valve regurgitation. No evidence of mitral valve stenosis. Tricuspid Valve: The tricuspid valve is normal in structure. Tricuspid valve regurgitation is mild . No evidence of tricuspid stenosis. Aortic Valve: The aortic valve is tricuspid. . There is severe thickening and severe calcifcation of the aortic valve. Aortic valve regurgitation is not visualized. Mild to moderate aortic stenosis is present. Severe aortic valve annular calcification. There is severe thickening of the aortic valve. There is severe calcifcation of the aortic valve. Aortic valve mean gradient measures 15.0 mmHg. Aortic valve peak gradient measures 29.4 mmHg. Aortic valve area, by VTI measures 1.38 cm. Pulmonic Valve: The pulmonic valve was not well visualized. Pulmonic valve regurgitation is not visualized. No evidence of pulmonic stenosis. Aorta: The aortic root is normal in size and structure. Pulmonary Artery: Indeterminate PASP, inadequate TR jet. Venous: The inferior vena cava is normal in size with greater than 50% respiratory variability, suggesting right atrial pressure of 3 mmHg. IAS/Shunts: No atrial level shunt detected by color flow Doppler.  LEFT VENTRICLE PLAX 2D LVIDd:         3.97 cm  Diastology LVIDs:         2.27 cm  LV e' lateral:   11.50 cm/s LV PW:         1.12 cm  LV E/e' lateral: 8.9 LV IVS:        1.07 cm  LV e' medial:    6.64 cm/s LVOT diam:     1.90 cm  LV E/e' medial:  15.4 LV SV:         82 LV SV Index:   37 LVOT Area:     2.84 cm  RIGHT VENTRICLE RV S prime:     11.90 cm/s TAPSE (M-mode):  2.1 cm LEFT ATRIUM             Index       RIGHT ATRIUM           Index LA diam:        3.30 cm 1.48 cm/m  RA Area:     24.30 cm LA Vol (A2C):   76.3 ml 34.24 ml/m RA Volume:   77.00 ml  34.56 ml/m LA Vol (A4C):   59.0 ml 26.48 ml/m LA Biplane Vol: 66.9 ml 30.02 ml/m  AORTIC VALVE AV Area (Vmax):    1.23 cm AV Area (Vmean):   1.49 cm AV Area (VTI):     1.38 cm AV Vmax:           271.00 cm/s AV Vmean:          180.000 cm/s AV VTI:  0.592 m AV Peak Grad:      29.4 mmHg AV Mean Grad:      15.0 mmHg LVOT Vmax:         118.00 cm/s LVOT Vmean:        94.600 cm/s LVOT VTI:          0.289 m LVOT/AV VTI ratio: 0.49  AORTA Ao Root diam: 3.30 cm MITRAL VALVE                TRICUSPID VALVE MV Area (PHT): 3.12 cm     TR Peak grad:   25.6 mmHg MV Decel Time: 243 msec     TR Vmax:        253.00 cm/s MV E velocity: 102.00 cm/s MV A velocity: 76.30 cm/s   SHUNTS MV E/A ratio:  1.34         Systemic VTI:  0.29 m                             Systemic Diam: 1.90 cm Carlyle Dolly MD Electronically signed by Carlyle Dolly MD Signature Date/Time: 01/30/2020/4:12:42 PM    Final    OCT, Retina - OU - Both Eyes  Result Date: 01/25/2020 Right Eye Quality was good. Scan locations included subfoveal. Central Foveal Thickness: 554. Progression has improved. Findings include abnormal foveal contour. Left Eye Quality was good. Scan locations included subfoveal. Notes OD, much less retinal thickening status post vitrectomy, membrane peel    CBC Recent Labs  Lab 01/28/20 0957 01/29/20 0616 01/29/20 1902 01/30/20 0532  WBC 18.3* 19.9* 16.3* 14.5*  HGB 9.4* 9.5* 9.6* 8.9*  HCT 30.8* 31.0* 31.2* 29.1*  PLT 257 287 257 254  MCV 102.7* 101.6* 103.3* 101.0*  MCH 31.3 31.1 31.8 30.9  MCHC 30.5 30.6 30.8 30.6  RDW 13.7 13.9 13.9 13.8  LYMPHSABS 1.5  --   --   --   MONOABS 1.8*  --   --   --   EOSABS 0.3  --   --   --   BASOSABS 0.1  --   --   --     Chemistries  Recent Labs  Lab 01/28/20 0957  01/29/20 0616 01/29/20 1902 01/30/20 0532  NA 137 137 133* 134*  K 3.9 4.3 4.3 4.0  CL 93* 92* 92* 92*  CO2 29 28 27 28   GLUCOSE 277* 251* 252* 183*  BUN 49* 57* 29* 37*  CREATININE 10.78* 12.01* 7.10* 8.09*  CALCIUM 8.4* 8.2* 8.2* 8.1*  MG  --   --  1.7  --   AST 20  --  18  --   ALT 14  --  13  --   ALKPHOS 122  --  96  --   BILITOT 0.6  --  0.8  --    ------------------------------------------------------------------------------------------------------------------ No results for input(s): CHOL, HDL, LDLCALC, TRIG, CHOLHDL, LDLDIRECT in the last 72 hours.  Lab Results  Component Value Date   HGBA1C 6.1 (H) 01/28/2020   ------------------------------------------------------------------------------------------------------------------ Recent Labs    01/29/20 1904  TSH 0.837  ------------------------------------------------------------------------------------------------------------------ No results for input(s): VITAMINB12, FOLATE, FERRITIN, TIBC, IRON, RETICCTPCT in the last 72 hours.  Coagulation profile Recent Labs  Lab 01/28/20 0957  INR 1.3*    No results for input(s): DDIMER in the last 72 hours.  Cardiac Enzymes No results for input(s): CKMB, TROPONINI, MYOGLOBIN in the last 168 hours.  Invalid input(s): CK ------------------------------------------------------------------------------------------------------------------ No results found for: BNP  Irwin Brakeman M.D on 01/31/2020 at 11:22 AM  Go to [email or web address] - for contact info How to contact the [facility] Attending or Consulting provider [place] or covering provider during after hours [place], for this patient?  1. Check the care team in [facility] and look for a) attending/consulting [facility] provider listed and b) the [facility] team listed 2. Log into [email or web address] and use [place] universal password to access. If you do not have the password, please contact the hospital operator. 3. Locate the [facility] provider you are  looking for under [facility] and page to a number that you can be directly reached. 4. If you still have difficulty reaching the provider, please page the [facility] (Director on Call) for the Hospitalists listed on amion for assistance.

## 2020-01-31 NOTE — Care Management Important Message (Signed)
Important Message  Patient Details  Name: Justin [name] MRN: [id number] Date of Birth: [birth year]   Medicare Important Message Given:  Yes     [name] 01/31/2020, 4:19 PM

## 2020-01-31 NOTE — Procedures (Signed)
     HEMODIALYSIS TREATMENT NOTE:   3.5 hour heparin-free HD completed via LUA AVF (15g/antegrade).  Able to tolerate removal of only 1 liter today.  UF was interrupted each time SBP<105 (as ordered, lthough asymptomatic) for a total of 1 hour.  Maintained NSR 76-89 throughout session.  Maxipime and Vancomycin were given at the end of treatment.  All blood was returned and hemostasis was achieved in 15 minutes.  No changes from pre-HD assessment.   Rockwell Alexandria, RN

## 2020-01-31 NOTE — Progress Notes (Signed)
Subjective: Less pain noted around the back abscess.  Objective: Vital signs in last 24 hours: Temp:  [97.6 F (36.4 C)-98.3 F (36.8 C)] 97.7 F (36.5 C) (07/21 1120) Pulse Rate:  [39-87] 79 (07/21 1120) Resp:  [10-27] 21 (07/21 1120) BP: (92-147)/(37-88) 133/74 (07/21 0900) SpO2:  [80 %-100 %] 100 % (07/21 1120) FiO2 (%):  [36 %] 36 % (07/20 2000) Weight:  [106.7 kg] 106.7 kg (07/21 0500) Last BM Date: 01/28/20  Intake/Output from previous day: 07/20 0701 - 07/21 0700 In: 200 [IV Piggyback:200] Out: -  Intake/Output this shift: Total I/O In: 360 [P.O.:360] Out: -   General appearance: alert, cooperative and no distress Incision/Wound: Less drainage noted from back wound.  Less eschar present.  Less erythema noted. Lab Results:  Recent Labs    01/29/20 1902 01/30/20 0532  WBC 16.3* 14.5*  HGB 9.6* 8.9*  HCT 31.2* 29.1*  PLT 257 254   BMET Recent Labs    01/29/20 1902 01/30/20 0532  NA 133* 134*  K 4.3 4.0  CL 92* 92*  CO2 27 28  GLUCOSE 252* 183*  BUN 29* 37*  CREATININE 7.10* 8.09*  CALCIUM 8.2* 8.1*   PT/INR No results for input(s): LABPROT, INR in the last 72 hours.  Studies/Results: ECHOCARDIOGRAM COMPLETE  Result Date: 01/30/2020    ECHOCARDIOGRAM REPORT   Patient Name:   Justin [name] Date of Exam: 01/30/2020 Medical Rec #:  [id number]      Height:       70.0 in Accession #:    [id number]     Weight:       233.2 lb Date of Birth:  02-Jan-1946      BSA:          2.228 m Patient Age:    74 years       BP:           134/54 mmHg Patient Gender: M              HR:           75 bpm. Exam Location:  [facility] Procedure: 2D Echo, Cardiac Doppler and Color Doppler Indications:    Abnormal ECG 794.31 / R94.31  History:        Patient has no prior history of Echocardiogram examinations.                 Arrythmias:Atrial Flutter; Risk Factors:Hypertension and                 Diabetes. ESRD on dialysis.  Sonographer:    Alvino Chapel RCS Referring Phys: [phone]  COURAGE EMOKPAE IMPRESSIONS  1. Left ventricular ejection fraction, by estimation, is 60 to 65%. The left ventricle has normal function. The left ventricle has no regional wall motion abnormalities. There is mild left ventricular hypertrophy. Left ventricular diastolic parameters are indeterminate.  2. Right ventricular systolic function is normal. The right ventricular size is normal. There is normal pulmonary artery systolic pressure.  3. Left atrial size was mildly dilated.  4. The mitral valve is normal in structure. No evidence of mitral valve regurgitation. No evidence of mitral stenosis.  5. The aortic valve is tricuspid. Aortic valve regurgitation is not visualized. Mild to moderate aortic valve stenosis.  6. The inferior vena cava is normal in size with greater than 50% respiratory variability, suggesting right atrial pressure of 3 mmHg. FINDINGS  Left Ventricle: Left ventricular ejection fraction, by estimation, is 60 to 65%. The left ventricle has  normal function. The left ventricle has no regional wall motion abnormalities. The left ventricular internal cavity size was normal in size. There is  mild left ventricular hypertrophy. Left ventricular diastolic parameters are indeterminate. Right Ventricle: The right ventricular size is normal. No increase in right ventricular wall thickness. Right ventricular systolic function is normal. There is normal pulmonary artery systolic pressure. The tricuspid regurgitant velocity is 2.53 m/s, and  with an assumed right atrial pressure of 3 mmHg, the estimated right ventricular systolic pressure is 47.0 mmHg. Left Atrium: Left atrial size was mildly dilated. Right Atrium: Right atrial size was normal in size. Pericardium: There is no evidence of pericardial effusion. Mitral Valve: The mitral valve is normal in structure. No evidence of mitral valve regurgitation. No evidence of mitral valve stenosis. Tricuspid Valve: The tricuspid valve is normal in structure.  Tricuspid valve regurgitation is mild . No evidence of tricuspid stenosis. Aortic Valve: The aortic valve is tricuspid. . There is severe thickening and severe calcifcation of the aortic valve. Aortic valve regurgitation is not visualized. Mild to moderate aortic stenosis is present. Severe aortic valve annular calcification. There is severe thickening of the aortic valve. There is severe calcifcation of the aortic valve. Aortic valve mean gradient measures 15.0 mmHg. Aortic valve peak gradient measures 29.4 mmHg. Aortic valve area, by VTI measures 1.38 cm. Pulmonic Valve: The pulmonic valve was not well visualized. Pulmonic valve regurgitation is not visualized. No evidence of pulmonic stenosis. Aorta: The aortic root is normal in size and structure. Pulmonary Artery: Indeterminate PASP, inadequate TR jet. Venous: The inferior vena cava is normal in size with greater than 50% respiratory variability, suggesting right atrial pressure of 3 mmHg. IAS/Shunts: No atrial level shunt detected by color flow Doppler.  LEFT VENTRICLE PLAX 2D LVIDd:         3.97 cm  Diastology LVIDs:         2.27 cm  LV e' lateral:   11.50 cm/s LV PW:         1.12 cm  LV E/e' lateral: 8.9 LV IVS:        1.07 cm  LV e' medial:    6.64 cm/s LVOT diam:     1.90 cm  LV E/e' medial:  15.4 LV SV:         82 LV SV Index:   37 LVOT Area:     2.84 cm  RIGHT VENTRICLE RV S prime:     11.90 cm/s TAPSE (M-mode): 2.1 cm LEFT ATRIUM             Index       RIGHT ATRIUM           Index LA diam:        3.30 cm 1.48 cm/m  RA Area:     24.30 cm LA Vol (A2C):   76.3 ml 34.24 ml/m RA Volume:   77.00 ml  34.56 ml/m LA Vol (A4C):   59.0 ml 26.48 ml/m LA Biplane Vol: 66.9 ml 30.02 ml/m  AORTIC VALVE AV Area (Vmax):    1.23 cm AV Area (Vmean):   1.49 cm AV Area (VTI):     1.38 cm AV Vmax:           271.00 cm/s AV Vmean:          180.000 cm/s AV VTI:            0.592 m AV Peak Grad:      29.4 mmHg AV Mean  Grad:      15.0 mmHg LVOT Vmax:         118.00 cm/s  LVOT Vmean:        94.600 cm/s LVOT VTI:          0.289 m LVOT/AV VTI ratio: 0.49  AORTA Ao Root diam: 3.30 cm MITRAL VALVE                TRICUSPID VALVE MV Area (PHT): 3.12 cm     TR Peak grad:   25.6 mmHg MV Decel Time: 243 msec     TR Vmax:        253.00 cm/s MV E velocity: 102.00 cm/s MV A velocity: 76.30 cm/s   SHUNTS MV E/A ratio:  1.34         Systemic VTI:  0.29 m                             Systemic Diam: 1.90 cm Carlyle Dolly MD Electronically signed by Carlyle Dolly MD Signature Date/Time: 01/30/2020/4:12:42 PM    Final     Anti-infectives: Anti-infectives (From admission, onward)   Start     Dose/Rate Route Frequency Ordered Stop   01/29/20 1800  vancomycin (VANCOCIN) IVPB 1000 mg/200 mL premix     Discontinue     1,000 mg 200 mL/hr over 60 Minutes Intravenous Every M-W-F (1800) 01/28/20 2138     01/28/20 1730  vancomycin (VANCOCIN) IVPB 1000 mg/200 mL premix  Status:  Discontinued        1,000 mg 200 mL/hr over 60 Minutes Intravenous  Once 01/28/20 1729 01/28/20 1738   01/28/20 1200  vancomycin (VANCOREADY) IVPB 2000 mg/400 mL        2,000 mg 200 mL/hr over 120 Minutes Intravenous  Once 01/28/20 1115 01/28/20 1308   01/28/20 1200  piperacillin-tazobactam (ZOSYN) IVPB 2.25 g  Status:  Discontinued        2.25 g 100 mL/hr over 30 Minutes Intravenous Every 8 hours 01/28/20 1117 01/28/20 1126   01/28/20 1130  ceFEPIme (MAXIPIME) 1 g in sodium chloride 0.9 % 100 mL IVPB     Discontinue     1 g 200 mL/hr over 30 Minutes Intravenous Every 24 hours 01/28/20 1126        Assessment/Plan: Impression: Back abscess secondary to sebaceous cyst, resolving.  Gram-positive cocci in pairs and rods seen, final ID pending. Continue wound care and antibiotics.  No need for incision and drainage at this point.  May restart anticoagulant medication.  Discussed with Dr. Wynetta Emery.  LOS: 2 days    Aviva Signs 01/31/2020

## 2020-02-01 DIAGNOSIS — Z992 Dependence on renal dialysis: Secondary | ICD-10-CM

## 2020-02-01 LAB — CBC WITH DIFFERENTIAL/PLATELET
Abs Immature Granulocytes: 0.13 10*3/uL — ABNORMAL HIGH (ref 0.00–0.07)
Basophils Absolute: 0.1 10*3/uL (ref 0.0–0.1)
Basophils Relative: 0 %
Eosinophils Absolute: 0.3 10*3/uL (ref 0.0–0.5)
Eosinophils Relative: 2 %
HCT: 27.1 % — ABNORMAL LOW (ref 39.0–52.0)
Hemoglobin: 8.3 g/dL — ABNORMAL LOW (ref 13.0–17.0)
Immature Granulocytes: 1 %
Lymphocytes Relative: 13 %
Lymphs Abs: 1.7 10*3/uL (ref 0.7–4.0)
MCH: 31.2 pg (ref 26.0–34.0)
MCHC: 30.6 g/dL (ref 30.0–36.0)
MCV: 101.9 fL — ABNORMAL HIGH (ref 80.0–100.0)
Monocytes Absolute: 1.3 10*3/uL — ABNORMAL HIGH (ref 0.1–1.0)
Monocytes Relative: 10 %
Neutro Abs: 10.2 10*3/uL — ABNORMAL HIGH (ref 1.7–7.7)
Neutrophils Relative %: 74 %
Platelets: 263 10*3/uL (ref 150–400)
RBC: 2.66 MIL/uL — ABNORMAL LOW (ref 4.22–5.81)
RDW: 13.8 % (ref 11.5–15.5)
WBC: 13.7 10*3/uL — ABNORMAL HIGH (ref 4.0–10.5)
nRBC: 0 % (ref 0.0–0.2)

## 2020-02-01 LAB — BASIC METABOLIC PANEL
Anion gap: 12 (ref 5–15)
BUN: 29 mg/dL — ABNORMAL HIGH (ref 8–23)
CO2: 28 mmol/L (ref 22–32)
Calcium: 8.5 mg/dL — ABNORMAL LOW (ref 8.9–10.3)
Chloride: 94 mmol/L — ABNORMAL LOW (ref 98–111)
Creatinine, Ser: 6.73 mg/dL — ABNORMAL HIGH (ref 0.61–1.24)
GFR calc Af Amer: 8 mL/min — ABNORMAL LOW (ref >60)
GFR calc non Af Amer: 7 mL/min — ABNORMAL LOW (ref >60)
Glucose, Bld: 114 mg/dL — ABNORMAL HIGH (ref 70–99)
Potassium: 4.1 mmol/L (ref 3.5–5.1)
Sodium: 134 mmol/L — ABNORMAL LOW (ref 135–145)

## 2020-02-01 LAB — GLUCOSE, CAPILLARY
Glucose-Capillary: 98 mg/dL (ref 70–99)
Glucose-Capillary: 127 mg/dL — ABNORMAL HIGH (ref 70–99)

## 2020-02-01 LAB — MAGNESIUM: Magnesium: 1.8 mg/dL (ref 1.7–2.4)

## 2020-02-01 MED ORDER — METOPROLOL TARTRATE 25 MG PO TABS: 25.0000 mg | ORAL_TABLET | Freq: Two times a day (BID) | ORAL | 1 refills | Status: DC

## 2020-02-01 MED ORDER — DOXYCYCLINE HYCLATE 100 MG PO CAPS
100.0000 mg | ORAL_CAPSULE | Freq: Two times a day (BID) | ORAL | 0 refills | Status: DC
Start: 2020-02-01 — End: 2020-02-01

## 2020-02-01 MED ORDER — DOXYCYCLINE HYCLATE 100 MG PO CAPS
100.0000 mg | ORAL_CAPSULE | Freq: Two times a day (BID) | ORAL | 0 refills | Status: DC
Start: 2020-02-01 — End: 2022-05-12

## 2020-02-01 NOTE — Discharge Summary (Signed)
Physician Discharge Summary  Justin [name]:[id number] DOB: [birth year] DOA: 01/28/2020  PCP: Kendrick Ranch, MD  Admit date: 01/28/2020 Discharge date: 02/01/2020  Admitted From:  HOME  Disposition: HOME   Recommendations for Outpatient Follow-up:  1. Follow up with PCP in 1 weeks 2. Follow up with cardiologist in 2 weeks 3. Resume regular hemodialysis schedule 4. Please follow up final wound culture results (still pending at discharge)  Discharge Condition: STABLE   CODE STATUS: FULL    Brief Hospitalization Summary: Please see all hospital notes, images, labs for full details of the hospitalization. ADMISSION HPI:  Justin [name]  is a 74 y.o. male reformed smoker with past medical history relevant for ESRD, getting HD on Mondays Wednesdays and Fridays, DM 2,HTN, anemia of ESRD, chronic anticoagulation with apixaban who presents to the ED with concerns about drainage from his left lower back wound---as per patient lumbar area  cystic lesion/mass has been present for at least 2 years, it  got bigger over the last 6 months and then started draining just within the last 24 hours prior to admission  No Nausea, Vomiting or Diarrhea No fever  Or chills  -In the ED labs and imaging studies were obtained -WBC 18.3 with hemoglobin of 9.4, lactic acid 1.6 -Creatinine 10.718 at dialysis patient with a glucose of 257 bicarb of 29 and anion gap of 15 -Chest x-ray without acute findings  CT AP IMPRESSION: -- 5.9 x 5.6 x 5.4 cm subcutaneous abscess at the level of the lumbosacral junction posteriorly. A necrotic malignancy is less  -EDP discussed case with on-call general surgeon, EDP did bedside I&D and sent wound and blood cultures awaiting full consult from general surgeon -EDP initiated antibiotics  A/p 1) atrial flutter with RVR and hemodynamic concerns--patient developed tachycardia after hemodialysis, received IV metoprolol tachycardia persisted, transferred to stepdown  unit for IV Cardize with good results, now resolved and cardiology signed off 7/21.  -Converted back to sinus rhythm in the early hours of 01/30/2020, off Cardizem drip  Continue  Eliquis for full anticoagulation -Echocardiogram with EF of 60 to 65%, unable to determine diagnostic parameters -TSH is 0.83 -Potassium is 4.0, mag noted -Troponin not elevated -Blood pressure soft, hold torsemide, hold amlodipine, hold hydralazine and hold losartan in order to allow for titration of  metoprolol for rate control in the setting of atrial flutter with RVR .  He has been stable on metoprolol 25 mg BID and apixaban 5 mg BID, follow up with outpatient cardiologist in [place], [place].    2)LumbarAreaAbscess/Cellulitis/5.9 x 5.6 x 5.4 cm subcutaneousAbscess--as per patient lumbar area cystic lesion/mass has been present for at least 2 years, itgot bigger over the last 6 months and then started draining just within the last 24 hours prior to admission Does Not meet Sepsis - IV vancomycin as per cellulitis order set, cefepime added with good results.   -General surgery consulted, discussed with Dr. Arnoldo Morale, no immediate need for surgery at this time, can discharge home on oral doxycycline and have outpatient follow up with Dr. Arnoldo Morale on 02/08/20.  -Await culture data from swab obtained by ED provider--still pending at time of discharge --preliminary Gram stain/culture with- Gram-positive cocci and rods -CT abdomen and pelvis demonstrating abscess as noted, fortunately it is draining.    3)ESRD---gets HD through left arm AV fistula Mondays Wednesdays and Fridays -Last HD session on 01/29/2020 -Nephrology consult appreciated  4)DM2-patient with diabetic nephropathy resume home insulin regimen Use Novolog/Humalog Sliding scale insulin with Accu-Cheks/Fingersticks  as ordered  4)Anemia of ESRD--Hgb stable at 8.9 ESA/Procrit per nephrology team  5)Chronic anticoagulation---resumed apixaban 5 mg  BID  6)HTN--stable, -Blood pressure soft, hold torsemide, hold amlodipine, hold hydralazine and hold losartan in order to allow for titration of Cardizem and metoprolol for rate control in the setting of atrial flutter with RVR, c/n Coreg 25 mg twice daily    Code Status : full  Family Communication:   (patient is alert, awake and coherent)   Consults  :  gen surge/Nephrology/cardiio  Discharge Diagnoses:  Principal Problem:   Wound infection Active Problems:   HTN (hypertension)   DM (diabetes mellitus) ([place])   Anemia in chronic kidney disease   ESRD on dialysis ([place])   Abscess   Atrial flutter ([place])   Sepsis ( [place])  Discharge Instructions:  Allergies as of 02/01/2020   No Known Allergies     Medication List    TAKE these medications   allopurinol 300 MG tablet Commonly known as: ZYLOPRIM Take 300 mg by mouth daily.   atorvastatin 40 MG tablet Commonly known as: LIPITOR Take 40 mg by mouth daily.   doxycycline 100 MG capsule Commonly known as: VIBRAMYCIN Take 1 capsule (100 mg total) by mouth 2 (two) times daily.   Eliquis 5 MG Tabs tablet Generic drug: apixaban Take 5 mg by mouth 2 (two) times daily.   metoprolol tartrate 25 MG tablet Commonly known as: LOPRESSOR Take 1 tablet (25 mg total) by mouth 2 (two) times daily. What changed:   medication strength  how much to take   omeprazole 20 MG capsule Commonly known as: PRILOSEC Take 20 mg by mouth every morning.       Follow-up Information    Aviva Signs, MD. Schedule an appointment as soon as possible for a visit on 02/08/2020.   Specialty: General Surgery Contact information: [address] [address] [phone]        Kendrick Ranch, MD. Schedule an appointment as soon as possible for a visit in 1 week(s).   Specialty: Internal Medicine Contact information: [place] [address] [phone]        Cardiologist in [place].  Schedule an appointment as soon as possible for a visit in 2 week(s).              No Known Allergies Allergies as of 02/01/2020   No Known Allergies     Medication List    TAKE these medications   allopurinol 300 MG tablet Commonly known as: ZYLOPRIM Take 300 mg by mouth daily.   atorvastatin 40 MG tablet Commonly known as: LIPITOR Take 40 mg by mouth daily.   doxycycline 100 MG capsule Commonly known as: VIBRAMYCIN Take 1 capsule (100 mg total) by mouth 2 (two) times daily.   Eliquis 5 MG Tabs tablet Generic drug: apixaban Take 5 mg by mouth 2 (two) times daily.   metoprolol tartrate 25 MG tablet Commonly known as: LOPRESSOR Take 1 tablet (25 mg total) by mouth 2 (two) times daily. What changed:   medication strength  how much to take   omeprazole 20 MG capsule Commonly known as: PRILOSEC Take 20 mg by mouth every morning.       Procedures/Studies: DG Chest 2 View  Result Date: 01/28/2020 CLINICAL DATA:  Sepsis. EXAM: CHEST - 2 VIEW COMPARISON:  December 30, 2016. FINDINGS: Stable cardiomediastinal silhouette. No pneumothorax or pleural effusion is noted. Lungs are clear. Bony thorax is unremarkable. IMPRESSION: No active cardiopulmonary disease. Aortic  Atherosclerosis (ICD10-I70.0). Electronically Signed   By: Marijo Conception M.D.   On: 01/28/2020 10:54   CT ABDOMEN PELVIS W CONTRAST  Result Date: 01/28/2020 CLINICAL DATA:  Large fungating mass overlying the lumbar spine on the skin for the past 6 months. Clinical concern for infection with abscess. The patient is on dialysis. EXAM: CT ABDOMEN AND PELVIS WITH CONTRAST TECHNIQUE: Multidetector CT imaging of the abdomen and pelvis was performed using the standard protocol following bolus administration of intravenous contrast. CONTRAST:  178mL OMNIPAQUE IOHEXOL 300 MG/ML  SOLN COMPARISON:  None. FINDINGS: Lower chest: Atheromatous calcifications, including the coronary arteries and aorta. Hepatobiliary: No focal  liver abnormality is seen. No gallstones, gallbladder wall thickening, or biliary dilatation. Pancreas: Unremarkable. No pancreatic ductal dilatation or surrounding inflammatory changes. Spleen: Normal in size without focal abnormality. Adrenals/Urinary Tract: Mild diffuse bilateral adrenal hyperplasia. Small kidneys. Multiple small rounded low density masses in both kidneys. Some of these have fluid density, compatible with cysts. Others are slightly higher than fluid density. The largest of these is in the lower pole of the left kidney, measuring 1.2 cm in maximum diameter and 20 Hounsfield units in density on the delayed images through the kidneys (image number 22 series 7). Poorly distended bladder with mild diffuse wall thickening. Unremarkable ureters. No urinary tract calculi or hydronephrosis. Stomach/Bowel: Small hiatal hernia. Minimal sigmoid colon diverticulosis. Normal appearing appendix. Unremarkable small bowel. Vascular/Lymphatic: Atheromatous arterial calcifications without aneurysm. No enlarged lymph nodes. Reproductive: Moderate Lea enlarged prostate gland. Other: Rounded fluid and gas collection in the subcutaneous fat at the level of the lumbosacral junction, measuring 5.9 x 5.6 x 5.4 cm on image number 48 series 2 and sagittal image number 69. There is mild diffuse surrounding soft tissue thickening with overlying skin thickening. Small to moderate-sized umbilical hernia containing fat. 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 Lumbar and lower thoracic spine degenerative changes. IMPRESSION: 1. 5.9 x 5.6 x 5.4 cm subcutaneous abscess at the level of the lumbosacral junction posteriorly. A necrotic malignancy is less likely. 2. Moderate prostatic hypertrophy. 3. 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 4. Minimal  sigmoid colon diverticulosis. 5. Small hiatal hernia. 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 Aortic Atherosclerosis (ICD10-I70.0). Electronically Signed   By: Claudie Revering M.D.   On: 01/28/2020 12:23   ECHOCARDIOGRAM COMPLETE  Result Date: 01/30/2020    ECHOCARDIOGRAM REPORT   Patient Name:   RILEN SHUKLA Date of Exam: 01/30/2020 Medical Rec #:  [id number]      Height:       70.0 in Accession #:    [id number]     Weight:       233.2 lb Date of Birth:  10-20-1945      BSA:          2.228 m Patient Age:    44 years       BP:           134/54 mmHg Patient Gender: M              HR:           75 bpm. Exam Location:  [facility] Procedure: 2D Echo, Cardiac Doppler and Color Doppler Indications:    Abnormal ECG 794.31 / R94.31  History:        Patient has no prior history of Echocardiogram examinations.  Arrythmias:Atrial Flutter; Risk Factors:Hypertension and                 Diabetes. ESRD on dialysis.  Sonographer:    Alvino Chapel RCS Referring Phys: [phone] COURAGE EMOKPAE IMPRESSIONS  1. Left ventricular ejection fraction, by estimation, is 60 to 65%. The left ventricle has normal function. The left ventricle has no regional wall motion abnormalities. There is mild left ventricular hypertrophy. Left ventricular diastolic parameters are indeterminate.  2. Right ventricular systolic function is normal. The right ventricular size is normal. There is normal pulmonary artery systolic pressure.  3. Left atrial size was mildly dilated.  4. The mitral valve is normal in structure. No evidence of mitral valve regurgitation. No evidence of mitral stenosis.  5. The aortic valve is tricuspid. Aortic valve regurgitation is not visualized. Mild to moderate aortic valve stenosis.  6. The inferior vena cava is normal in size with greater than 50% respiratory variability, suggesting right atrial pressure of 3 mmHg. FINDINGS  Left Ventricle: Left ventricular ejection fraction, by estimation, is 60 to  65%. The left ventricle has normal function. The left ventricle has no regional wall motion abnormalities. The left ventricular internal cavity size was normal in size. There is  mild left ventricular hypertrophy. Left ventricular diastolic parameters are indeterminate. Right Ventricle: The right ventricular size is normal. No increase in right ventricular wall thickness. Right ventricular systolic function is normal. There is normal pulmonary artery systolic pressure. The tricuspid regurgitant velocity is 2.53 m/s, and  with an assumed right atrial pressure of 3 mmHg, the estimated right ventricular systolic pressure is 82.9 mmHg. Left Atrium: Left atrial size was mildly dilated. Right Atrium: Right atrial size was normal in size. Pericardium: There is no evidence of pericardial effusion. Mitral Valve: The mitral valve is normal in structure. No evidence of mitral valve regurgitation. No evidence of mitral valve stenosis. Tricuspid Valve: The tricuspid valve is normal in structure. Tricuspid valve regurgitation is mild . No evidence of tricuspid stenosis. Aortic Valve: The aortic valve is tricuspid. . There is severe thickening and severe calcifcation of the aortic valve. Aortic valve regurgitation is not visualized. Mild to moderate aortic stenosis is present. Severe aortic valve annular calcification. There is severe thickening of the aortic valve. There is severe calcifcation of the aortic valve. Aortic valve mean gradient measures 15.0 mmHg. Aortic valve peak gradient measures 29.4 mmHg. Aortic valve area, by VTI measures 1.38 cm. Pulmonic Valve: The pulmonic valve was not well visualized. Pulmonic valve regurgitation is not visualized. No evidence of pulmonic stenosis. Aorta: The aortic root is normal in size and structure. Pulmonary Artery: Indeterminate PASP, inadequate TR jet. Venous: The inferior vena cava is normal in size with greater than 50% respiratory variability, suggesting right atrial pressure of  3 mmHg. IAS/Shunts: No atrial level shunt detected by color flow Doppler.  LEFT VENTRICLE PLAX 2D LVIDd:         3.97 cm  Diastology LVIDs:         2.27 cm  LV e' lateral:   11.50 cm/s LV PW:         1.12 cm  LV E/e' lateral: 8.9 LV IVS:        1.07 cm  LV e' medial:    6.64 cm/s LVOT diam:     1.90 cm  LV E/e' medial:  15.4 LV SV:         82 LV SV Index:   37 LVOT Area:     2.84  cm  RIGHT VENTRICLE RV S prime:     11.90 cm/s TAPSE (M-mode): 2.1 cm LEFT ATRIUM             Index       RIGHT ATRIUM           Index LA diam:        3.30 cm 1.48 cm/m  RA Area:     24.30 cm LA Vol (A2C):   76.3 ml 34.24 ml/m RA Volume:   77.00 ml  34.56 ml/m LA Vol (A4C):   59.0 ml 26.48 ml/m LA Biplane Vol: 66.9 ml 30.02 ml/m  AORTIC VALVE AV Area (Vmax):    1.23 cm AV Area (Vmean):   1.49 cm AV Area (VTI):     1.38 cm AV Vmax:           271.00 cm/s AV Vmean:          180.000 cm/s AV VTI:            0.592 m AV Peak Grad:      29.4 mmHg AV Mean Grad:      15.0 mmHg LVOT Vmax:         118.00 cm/s LVOT Vmean:        94.600 cm/s LVOT VTI:          0.289 m LVOT/AV VTI ratio: 0.49  AORTA Ao Root diam: 3.30 cm MITRAL VALVE                TRICUSPID VALVE MV Area (PHT): 3.12 cm     TR Peak grad:   25.6 mmHg MV Decel Time: 243 msec     TR Vmax:        253.00 cm/s MV E velocity: 102.00 cm/s MV A velocity: 76.30 cm/s   SHUNTS MV E/A ratio:  1.34         Systemic VTI:  0.29 m                             Systemic Diam: 1.90 cm Carlyle Dolly MD Electronically signed by Carlyle Dolly MD Signature Date/Time: 01/30/2020/4:12:42 PM    Final    OCT, Retina - OU - Both Eyes  Result Date: 01/25/2020 Right Eye Quality was good. Scan locations included subfoveal. Central Foveal Thickness: 554. Progression has improved. Findings include abnormal foveal contour. Left Eye Quality was good. Scan locations included subfoveal. Notes OD, much less retinal thickening status post vitrectomy, membrane peel     Subjective: Pt says he wants to go  home, He has no complaints.  He says he will follow up as recommended.   Discharge Exam: Vitals:   01/31/20 2052 01/31/20 2355  BP:  (!) 109/57  Pulse:  83  Resp:  20  Temp:  99.3 F (37.4 C)  SpO2: 100% 100%   Vitals:   01/31/20 1826 01/31/20 1955 01/31/20 2052 01/31/20 2355  BP: 113/70 98/64  (!) 109/57  Pulse: 82 83  83  Resp: 16 20  20   Temp: 98.8 F (37.1 C) 98.9 F (37.2 C)  99.3 F (37.4 C)  TempSrc:    Oral  SpO2: 100% 100% 100% 100%  Weight:      Height:       General: Pt is alert, awake, not in acute distress Cardiovascular: RRR, S1/S2 +, no rubs, no gallops Respiratory: CTA bilaterally, no wheezing, no rhonchi Abdominal: Soft, NT, ND, bowel sounds + Extremities: no  edema, no cyanosis Skin: back wound draining well.    The results of significant diagnostics from this hospitalization (including imaging, microbiology, ancillary and laboratory) are listed below for reference.     Microbiology: Recent Results (from the past 240 hour(s))  Culture, blood (Routine x 2)     Status: None (Preliminary result)   Collection Time: 01/28/20  9:57 AM   Specimen: Right Antecubital; Blood  Result Value Ref Range Status   Specimen Description   Final    RIGHT ANTECUBITAL BOTTLES DRAWN AEROBIC AND ANAEROBIC   Special Requests Blood Culture adequate volume  Final   Culture   Final    NO GROWTH 4 DAYS Performed at [facility], [address] [place], [place] [address]    Report Status PENDING  Incomplete  Culture, blood (Routine x 2)     Status: None (Preliminary result)   Collection Time: 01/28/20  9:57 AM   Specimen: BLOOD RIGHT HAND  Result Value Ref Range Status   Specimen Description BLOOD RIGHT HAND AEROBIC BOTTLE ONLY  Final   Special Requests   Final    Blood Culture results may not be optimal due to an inadequate volume of blood received in culture bottles   Culture   Final    NO GROWTH 4 DAYS Performed at [facility], [address] [place], [place]  [address]    Report Status PENDING  Incomplete  Aerobic/Anaerobic Culture (surgical/deep wound)     Status: None (Preliminary result)   Collection Time: 01/28/20 12:49 PM   Specimen: Back; Abscess  Result Value Ref Range Status   Specimen Description   Final    BACK Performed at [facility], [address], [place], [place] [address]    Special Requests   Final    Normal Performed at [address] [place], [place] [address]    Gram Stain   Final    FEW WBC PRESENT, PREDOMINANTLY PMN RARE GRAM POSITIVE COCCI IN PAIRS RARE GRAM POSITIVE RODS    Culture   Final    NO GROWTH 3 DAYS NO ANAEROBES ISOLATED; CULTURE IN PROGRESS FOR 5 DAYS Performed at [facility], [address], [place], [place] [address]    Report Status PENDING  Incomplete  SARS Coronavirus 2 by RT PCR (hospital order, performed in [facility] hospital lab) Nasopharyngeal Nasopharyngeal Swab     Status: None   Collection Time: 01/28/20  1:53 PM   Specimen: Nasopharyngeal Swab  Result Value Ref Range Status   SARS Coronavirus 2 NEGATIVE NEGATIVE Final    Comment: (NOTE) SARS-CoV-2 target nucleic acids are NOT DETECTED.  The SARS-CoV-2 RNA is generally detectable in upper and lower respiratory specimens during the acute phase of infection. The lowest concentration of SARS-CoV-2 viral copies this assay can detect is 250 copies / mL. A negative result does not preclude SARS-CoV-2 infection and should not be used as the sole basis for treatment or other patient management decisions.  A negative result may occur with improper specimen collection / handling, submission of specimen other than nasopharyngeal swab, presence of viral mutation(s) within the areas targeted by this assay, and inadequate number of viral copies (<250 copies / mL). A negative result must be combined with clinical observations, patient history, and epidemiological information.  Fact Sheet for Patients:    StrictlyIdeas.no  Fact Sheet for Healthcare Providers: BankingDealers.co.za  This test is not yet approved or  cleared by the [place] FDA and has been authorized for  detection and/or diagnosis of SARS-CoV-2 by FDA under an Emergency Use Authorization (EUA).  This EUA will remain in effect (meaning this test can be used) for the duration of the COVID-19 declaration under Section 564(b)(1) of the Act, 21 U.S.C. section 360bbb-3(b)(1), unless the authorization is terminated or revoked sooner.  Performed at [facility], [address], [place], [place] [address]   MRSA PCR Screening     Status: None   Collection Time: 01/29/20  6:59 PM   Specimen: Nasopharyngeal  Result Value Ref Range Status   MRSA by PCR NEGATIVE NEGATIVE Final    Comment:        The GeneXpert MRSA Assay (FDA approved for NASAL specimens only), is one component of a comprehensive MRSA colonization surveillance program. It is not intended to diagnose MRSA infection nor to guide or monitor treatment for MRSA infections. Performed at [facility], [address] [place],  [address]      Labs: BNP (last 3 results) No results for input(s): BNP in the last 8760 hours. Basic Metabolic Panel: Recent Labs  Lab 01/28/20 0957 01/29/20 0616 01/29/20 1902 01/30/20 0532 02/01/20 0424  NA 137 137 133* 134* 134*  K 3.9 4.3 4.3 4.0 4.1  CL 93* 92* 92* 92* 94*  CO2 29 28 27 28 28   GLUCOSE 277* 251* 252* 183* 114*  BUN 49* 57* 29* 37* 29*  CREATININE 10.78* 12.01* 7.10* 8.09* 6.73*  CALCIUM 8.4* 8.2* 8.2* 8.1* 8.5*  MG  --   --  1.7  --  1.8   Liver Function Tests: Recent Labs  Lab 01/28/20 0957 01/29/20 1902  AST 20 18  ALT 14 13  ALKPHOS 122 96  BILITOT 0.6 0.8  PROT 7.7 7.5  ALBUMIN 3.0* 2.7*   No results for input(s): LIPASE, AMYLASE in the last 168 hours. No results for input(s): AMMONIA in the last 168 hours. CBC: Recent Labs  Lab  01/28/20 0957 01/29/20 0616 01/29/20 1902 01/30/20 0532 02/01/20 0424  WBC 18.3* 19.9* 16.3* 14.5* 13.7*  NEUTROABS 14.5*  --   --   --  10.2*  HGB 9.4* 9.5* 9.6* 8.9* 8.3*  HCT 30.8* 31.0* 31.2* 29.1* 27.1*  MCV 102.7* 101.6* 103.3* 101.0* 101.9*  PLT 257 287 257 254 263   Cardiac Enzymes: No results for input(s): CKTOTAL, CKMB, CKMBINDEX, TROPONINI in the last 168 hours. BNP: Invalid input(s): POCBNP CBG: Recent Labs  Lab 01/31/20 1119 01/31/20 1601 01/31/20 2044 02/01/20 0726 02/01/20 1118  GLUCAP 150* 157* 140* 98 127*   D-Dimer No results for input(s): DDIMER in the last 72 hours. Hgb A1c No results for input(s): HGBA1C in the last 72 hours. Lipid Profile No results for input(s): CHOL, HDL, LDLCALC, TRIG, CHOLHDL, LDLDIRECT in the last 72 hours. Thyroid function studies Recent Labs    01/29/20 1904  TSH 0.837   Anemia work up No results for input(s): VITAMINB12, FOLATE, FERRITIN, TIBC, IRON, RETICCTPCT in the last 72 hours. Urinalysis    Component Value Date/Time   COLORURINE YELLOW 08/02/2010 1534   APPEARANCEUR CLEAR 08/02/2010 1534   LABSPEC 1.025 08/02/2010 1534   PHURINE 6.0 08/02/2010 1534   GLUCOSEU NEGATIVE 09/20/2009 1054   HGBUR MODERATE (A) 08/02/2010 1534   BILIRUBINUR NEGATIVE 08/02/2010 1534   KETONESUR NEGATIVE 08/02/2010 1534   PROTEINUR >300 (A) 08/02/2010 1534   UROBILINOGEN 0.2 08/02/2010 1534   NITRITE NEGATIVE 08/02/2010 1534   LEUKOCYTESUR NEGATIVE 08/02/2010 1534   Sepsis Labs Invalid input(s): PROCALCITONIN,  WBC,  LACTICIDVEN Microbiology  Recent Results (from the past 240 hour(s))  Culture, blood (Routine x 2)     Status: None (Preliminary result)   Collection Time: 01/28/20  9:57 AM   Specimen: Right Antecubital; Blood  Result Value Ref Range Status   Specimen Description   Final    RIGHT ANTECUBITAL BOTTLES DRAWN AEROBIC AND ANAEROBIC   Special Requests Blood Culture adequate volume  Final   Culture   Final    NO  GROWTH 4 DAYS Performed at [facility], [address] [place], [place] [address]    Report Status PENDING  Incomplete  Culture, blood (Routine x 2)     Status: None (Preliminary result)   Collection Time: 01/28/20  9:57 AM   Specimen: BLOOD RIGHT HAND  Result Value Ref Range Status   Specimen Description BLOOD RIGHT HAND AEROBIC BOTTLE ONLY  Final   Special Requests   Final    Blood Culture results may not be optimal due to an inadequate volume of blood received in culture bottles   Culture   Final    NO GROWTH 4 DAYS Performed at [facility], [address], [place], [place] [address]    Report Status PENDING  Incomplete  Aerobic/Anaerobic Culture (surgical/deep wound)     Status: None (Preliminary result)   Collection Time: 01/28/20 12:49 PM   Specimen: Back; Abscess  Result Value Ref Range Status   Specimen Description   Final    BACK Performed at [facility], [address] [place], [place] [id number]    Special Requests   Final    Normal Performed at [address] [place], [place] [address]    Gram Stain   Final    FEW WBC PRESENT, PREDOMINANTLY PMN RARE GRAM POSITIVE COCCI IN PAIRS RARE GRAM POSITIVE RODS    Culture   Final    NO GROWTH 3 DAYS NO ANAEROBES ISOLATED; CULTURE IN PROGRESS FOR 5 DAYS Performed at [facility], [address], [place], [place] [address]    Report Status PENDING  Incomplete  SARS Coronavirus 2 by RT PCR (hospital order, performed in [facility] hospital lab) Nasopharyngeal Nasopharyngeal Swab     Status: None   Collection Time: 01/28/20  1:53 PM   Specimen: Nasopharyngeal Swab  Result Value Ref Range Status   SARS Coronavirus 2 NEGATIVE NEGATIVE Final    Comment: (NOTE) SARS-CoV-2 target nucleic acids are NOT DETECTED.  The SARS-CoV-2 RNA is generally detectable in upper and lower respiratory specimens during the acute phase of infection. The lowest concentration of SARS-CoV-2 viral copies this assay can detect  is 250 copies / mL. A negative result does not preclude SARS-CoV-2 infection and should not be used as the sole basis for treatment or other patient management decisions.  A negative result may occur with improper specimen collection / handling, submission of specimen other than nasopharyngeal swab, presence of viral mutation(s) within the areas targeted by this assay, and inadequate number of viral copies (<250 copies / mL). A negative result must be combined with clinical observations, patient history, and epidemiological information.  Fact Sheet for Patients:   StrictlyIdeas.no  Fact Sheet for Healthcare Providers: BankingDealers.co.za  This test is not yet approved or  cleared by the [place] FDA and has been authorized for detection and/or diagnosis of SARS-CoV-2 by FDA under an Emergency Use Authorization (EUA).  This EUA will remain in effect (meaning this test can be used) for the duration of the COVID-19 declaration under Section  564(b)(1) of the Act, 21 U.S.C. section 360bbb-3(b)(1), unless the authorization is terminated or revoked sooner.  Performed at [facility], [address], [place], [place] [address]   MRSA PCR Screening     Status: None   Collection Time: 01/29/20  6:59 PM   Specimen: Nasopharyngeal  Result Value Ref Range Status   MRSA by PCR NEGATIVE NEGATIVE Final    Comment:        The GeneXpert MRSA Assay (FDA approved for NASAL specimens only), is one component of a comprehensive MRSA colonization surveillance program. It is not intended to diagnose MRSA infection nor to guide or monitor treatment for MRSA infections. Performed at [facility], [address] [place], [place] [address]    Time coordinating discharge: 34 mins  SIGNED:  Irwin Brakeman, MD  [facility] 02/01/2020, 11:53 AM How to contact the [facility] Attending or Consulting provider [place] or covering provider during after  hours [place], for this patient?  1. Check the care team in [facility] and look for a) attending/consulting TR[facility] provider listed and b) the [facility] team listed 2. Log into [email or web address] and use [place]'s universal password to access. If you do not have the password, please contact the hospital operator. 3. Locate the [facility] provider you are looking for under [facility] and page to a number that you can be directly reached. 4. If you still have difficulty reaching the provider, please page the [facility] (Director on Call) for the Hospitalists listed on amion for assistance.

## 2020-02-01 NOTE — Evaluation (Signed)
Physical Therapy Evaluation Patient Details Name: Justin [name] MRN: [id number] DOB: Jun 23, 1946 Today's Date: 02/01/2020   History of Present Illness  Justin [name]  is a 74 y.o. male reformed smoker with past medical history relevant for ESRD, getting HD on Mondays Wednesdays and Fridays, DM 2,HTN, anemia of ESRD, chronic anticoagulation with apixaban who presents to the ED with concerns about drainage from his left lower back wound---as per patient lumbar area  cystic lesion/mass has been present for at least 2 years, it  got bigger over the last 6 months and then started draining just within the last 24 hours prior to admission    Clinical Impression  Patient functioning at baseline for functional mobility and gait.  Patient limited to ambulation due to doorway with frequent leaning on objects for nearby support due to chronic bilateral hip pain, states this is his baseline and declined HHPT follow up.  Plan:  Patient discharged from physical therapy to care of nursing for ambulation daily as tolerated for length of stay.     Follow Up Recommendations Supervision for mobility/OOB;Supervision - Intermittent    Equipment Recommendations  None recommended by PT    Recommendations for Other Services       Precautions / Restrictions Precautions Precautions: Fall Restrictions Weight Bearing Restrictions: No      Mobility  Bed Mobility                  Transfers                 General transfer comment: presents seated at bedside  Ambulation/Gait Ambulation/Gait assistance: Modified independent (Device/Increase time);Supervision Gait Distance (Feet): 22 Feet Assistive device: Straight cane Gait Pattern/deviations: Decreased step length - right;Decreased step length - left;Decreased stance time - right;Decreased stride length Gait velocity: decreased   General Gait Details: slow slightly labored cadence with frequent leaning on nearby objects for support, limited  mostly due to c/o chronic bilateral hip pain  Stairs            Wheelchair Mobility    Modified Rankin (Stroke Patients Only)       Balance Overall balance assessment: Needs assistance Sitting-balance support: Feet supported;No upper extremity supported Sitting balance-Leahy Scale: Good Sitting balance - Comments: good   Standing balance support: During functional activity;Single extremity supported Standing balance-Leahy Scale: Fair Standing balance comment: using SPC                             Pertinent Vitals/Pain Pain Assessment: 0-10 Pain Score: 6  Pain Location: chronic bilateral hip pain Pain Descriptors / Indicators: Discomfort;Sore Pain Intervention(s): Limited activity within patient's tolerance;Monitored during session    [place] expects to be discharged to:: Private residence Living Arrangements: Alone Available Help at Discharge: Family;Available PRN/intermittently Type of Home: Apartment Home Access: Stairs to enter Entrance Stairs-Rails: Right Entrance Stairs-Number of Steps: 3 Home Layout: One level Home Equipment: Cane - single point      Prior Function Level of Independence: Independent with assistive device(s)         Comments: household and short distanced community ambulator using [facility], uses electric cart in grocery stores     Hand Dominance        Extremity/Trunk Assessment   Upper Extremity Assessment Upper Extremity Assessment: Overall WFL for tasks assessed    Lower Extremity Assessment Lower Extremity Assessment: Generalized weakness    Cervical / Trunk Assessment Cervical / Trunk Assessment: Normal  Communication   Communication: No difficulties  Cognition Arousal/Alertness: Awake/alert Behavior During Therapy: WFL for tasks assessed/performed Overall Cognitive Status: Within Functional Limits for tasks assessed                                        General  Comments      Exercises     Assessment/Plan    PT Assessment Patent does not need any further PT services  PT Problem List         PT Treatment Interventions      PT Goals (Current goals can be found in the Care Plan section)  Acute Rehab PT Goals Patient Stated Goal: return home with family to assist PT Goal Formulation: With patient Time For Goal Achievement: 02/01/20 Potential to Achieve Goals: Good    Frequency     Barriers to discharge        Co-evaluation               AM-PAC PT "6 Clicks" Mobility  Outcome Measure Help needed turning from your back to your side while in a flat bed without using bedrails?: None Help needed moving from lying on your back to sitting on the side of a flat bed without using bedrails?: None Help needed moving to and from a bed to a chair (including a wheelchair)?: None Help needed standing up from a chair using your arms (e.g., wheelchair or bedside chair)?: None Help needed to walk in hospital room?: A Little Help needed climbing 3-5 steps with a railing? : A Little 6 Click Score: 22    End of Session   Activity Tolerance: Patient tolerated treatment well;Patient limited by fatigue;Patient limited by pain Patient left: in bed;with call bell/phone within reach Nurse Communication: Mobility status PT Visit Diagnosis: Unsteadiness on feet (R26.81);Other abnormalities of gait and mobility (R26.89);Muscle weakness (generalized) (M62.81)    Time: 1937-9024 PT Time Calculation (min) (ACUTE ONLY): 14 min   Charges:   PT Evaluation $PT Eval Low Complexity: 1 Low PT Treatments $Therapeutic Activity: 8-22 mins        12:06 PM, 02/01/20 Lonell Grandchild, MPT Physical Therapist with [facility] 336 [phone] office [phone] mobile phone

## 2020-02-01 NOTE — Progress Notes (Signed)
Dressing changed to back abscess.  Purulent drainage on old dressing.  Wound cleansed and santyl and foam dressing applied.  IV removed and discharge instructions reviewed.  New scripts sent to pharmacy.  Transported by WC to car and patient to drive self home

## 2020-02-01 NOTE — Discharge Instructions (Signed)
Please make appointment to see surgeon Dr. Arnoldo Morale on 02/08/20.     IMPORTANT INFORMATION: PAY CLOSE ATTENTION   PHYSICIAN DISCHARGE INSTRUCTIONS  Follow with Primary care provider  [name], [name], MD  and other consultants as instructed by your Hospitalist Physician  [place] IF SYMPTOMS COME BACK, WORSEN OR NEW PROBLEM DEVELOPS   Please note: You were cared for by a hospitalist during your hospital stay. Every effort will be made to forward records to your primary care provider.  You can request that your primary care provider send for your hospital records if they have not received them.  Once you are discharged, your primary care physician will handle any further medical issues. Please note that NO REFILLS for any discharge medications will be authorized once you are discharged, as it is imperative that you return to your primary care physician (or establish a relationship with a primary care physician if you do not have one) for your post hospital discharge needs so that they can reassess your need for medications and monitor your lab values.  Please get a complete blood count and chemistry panel checked by your Primary MD at your next visit, and again as instructed by your Primary MD.  Get Medicines reviewed and adjusted: Please take all your medications with you for your next visit with your Primary MD  Laboratory/radiological data: Please request your Primary MD to go over all hospital tests and procedure/radiological results at the follow up, please ask your primary care provider to get all Hospital records sent to his/her office.  In some cases, they will be blood work, cultures and biopsy results pending at the time of your discharge. Please request that your primary care provider follow up on these results.  If you are diabetic, please bring your blood sugar readings with you to your follow up appointment with primary care.     Please call and make your follow up appointments as soon as possible.    Also Note the following: If you experience worsening of your admission symptoms, develop shortness of breath, life threatening emergency, suicidal or homicidal thoughts you must seek medical attention immediately by calling 911 or calling your MD immediately  if symptoms less severe.  You must read complete instructions/literature along with all the possible adverse reactions/side effects for all the Medicines you take and that have been prescribed to you. Take any new Medicines after you have completely understood and accpet all the possible adverse reactions/side effects.   Do not drive when taking Pain medications or sleeping medications (Benzodiazepines)  Do not take more than prescribed Pain, Sleep and Anxiety Medications. It is not advisable to combine anxiety,sleep and pain medications without talking with your primary care practitioner  Special Instructions: If you have smoked or chewed Tobacco  in the last 2 yrs please stop smoking, stop any regular Alcohol  and or any Recreational drug use.  Wear Seat belts while driving.  Do not drive if taking any narcotic, mind altering or controlled substances or recreational drugs or alcohol.

## 2020-02-01 NOTE — Progress Notes (Signed)
Admit: 01/28/2020 LOS: 3  73 ESRD MWF [place] via LUE AVF, subcutaneous abscess in lower back, hospitalization complicated by AFib/Flutter with some hypotensio  Subjective:  . No co . HD yesterday 1L UF . Wants to go home . HR stable in NSR. On MTP and apixaban  07/21 0701 - 07/22 0700 In: 836.7 [P.O.:720; IV Piggyback:116.7] Out: 1213 [Urine:200]  Filed Weights   01/30/20 0500 01/31/20 0500 01/31/20 1130  Weight: 105.8 kg 106.7 kg 106.1 kg    Scheduled Meds: . allopurinol  100 mg Oral BH-q7a  . allopurinol  300 mg Oral Daily  . apixaban  5 mg Oral BID  . aspirin EC  81 mg Oral BH-q7a  . atorvastatin  40 mg Oral Daily  . Chlorhexidine Gluconate Cloth  6 each Topical Daily  . collagenase   Topical UD  . doxercalciferol  12 mcg Intravenous Q M,W,F-HD  . insulin aspart  0-5 Units Subcutaneous QHS  . insulin aspart  0-6 Units Subcutaneous TID WC  . insulin aspart  2 Units Subcutaneous TID WC  . insulin glargine  10 Units Subcutaneous QHS  . lidocaine (PF)  5 mL Other Once  . metoprolol tartrate  25 mg Oral BID  . multivitamin with minerals  1 tablet Oral Daily  . ofloxacin  1 drop Right Eye QID  . pantoprazole  40 mg Oral Daily  . potassium chloride  10 mEq Oral Daily  . prednisoLONE acetate  1 drop Right Eye QID  . sodium chloride flush  3 mL Intravenous Q12H  . sucroferric oxyhydroxide  500 mg Oral TID   Continuous Infusions: . sodium chloride Stopped (01/29/20 2220)  . sodium chloride 100 mL (01/30/20 0019)  . sodium chloride    . ceFEPime (MAXIPIME) IV 1 g (01/31/20 1510)  . vancomycin Stopped (01/31/20 1800)   PRN Meds:.sodium chloride, sodium chloride, sodium chloride, acetaminophen **OR** acetaminophen, albuterol, cyclobenzaprine, HYDROcodone-acetaminophen, HYDROmorphone (DILAUDID) injection, labetalol, lidocaine (PF), lidocaine-prilocaine, ondansetron **OR** ondansetron (ZOFRAN) IV, oxyCODONE, pentafluoroprop-tetrafluoroeth, polyethylene glycol, sodium chloride  flush, traMADol, traZODone  Current Labs: reviewed   Physical Exam:  Blood pressure (!) 109/57, pulse 83, temperature 99.3 F (37.4 C), temperature source Oral, resp. rate 20, height 5\' 10"  (1.778 m), weight 106.1 kg, SpO2 100 %. GEN: NAD, sitting in bed NT: NCAT EYES: EOMI CV: nl rate,  Cont murmur, nl s1s2 - prob AVF murmur PULM: CTAB nl s1s2 ABD: s/nt/nd  SKIN: bandaged lower back EXT: Trace LEE NEURO: Nonfocal  A 1. ESRD LUE AVF MWF [place] 2. Subcutaneous abscess: Cx pending / not resulted; Vanc/Cefepime, drained.  Gen surg following; change to PO 3. Anemia, Hb 8.3 this AM 4. CKD-BMD: Ca ok; high dose VDRA 5. Tachycardia, ? Related #2, Flutter/Fib. Resolved on BB and DOAC 6. HTN: BP stable, meds held and BP stable  P . HD on schedule: 2K, 3.5h, AVF 400/800, 1-2L UF SBP > 105, no heparin . ESA and Fe tomorrow, if here, can be done as outpt as well . Medication Issues; o Preferred narcotic agents for pain control are hydromorphone, fentanyl, and methadone. Morphine should not be used.  o Baclofen should be avoided o Avoid oral sodium phosphate and magnesium citrate based laxatives / bowel preps    Pearson Grippe MD 02/01/2020, 9:01 AM  Recent Labs  Lab 01/29/20 1902 01/30/20 0532 02/01/20 0424  NA 133* 134* 134*  K 4.3 4.0 4.1  CL 92* 92* 94*  CO2 27 28 28   GLUCOSE 252* 183* 114*  BUN 29*  37* 29*  CREATININE 7.10* 8.09* 6.73*  CALCIUM 8.2* 8.1* 8.5*   Recent Labs  Lab 01/28/20 0957 01/29/20 0616 01/29/20 1902 01/30/20 0532 02/01/20 0424  WBC 18.3*   < > 16.3* 14.5* 13.7*  NEUTROABS 14.5*  --   --   --  10.2*  HGB 9.4*   < > 9.6* 8.9* 8.3*  HCT 30.8*   < > 31.2* 29.1* 27.1*  MCV 102.7*   < > 103.3* 101.0* 101.9*  PLT 257   < > 257 254 263   < > = values in this interval not displayed.

## 2020-02-02 LAB — CULTURE, BLOOD (ROUTINE X 2)
Culture: NO GROWTH
Culture: NO GROWTH
Special Requests: ADEQUATE

## 2020-02-04 LAB — AEROBIC/ANAEROBIC CULTURE W GRAM STAIN (SURGICAL/DEEP WOUND): Special Requests: NORMAL

## 2020-03-07 ENCOUNTER — Encounter (INDEPENDENT_AMBULATORY_CARE_PROVIDER_SITE_OTHER): Payer: Medicare Other | Admitting: Ophthalmology

## 2020-04-02 ENCOUNTER — Encounter (INDEPENDENT_AMBULATORY_CARE_PROVIDER_SITE_OTHER): Payer: Self-pay | Admitting: Ophthalmology

## 2020-04-02 ENCOUNTER — Ambulatory Visit (INDEPENDENT_AMBULATORY_CARE_PROVIDER_SITE_OTHER): Payer: Medicare Other | Admitting: Ophthalmology

## 2020-04-02 ENCOUNTER — Other Ambulatory Visit: Payer: Self-pay

## 2020-04-02 DIAGNOSIS — E113511 Type 2 diabetes mellitus with proliferative diabetic retinopathy with macular edema, right eye: Secondary | ICD-10-CM

## 2020-04-02 DIAGNOSIS — H35371 Puckering of macula, right eye: Secondary | ICD-10-CM | POA: Diagnosis not present

## 2020-04-02 NOTE — Assessment & Plan Note (Signed)

## 2020-04-02 NOTE — Assessment & Plan Note (Signed)
OD, now 2-1/2 months status post vitrectomy, membrane peel for severe epiretinal membrane with counting years vision.  Visual acuity is much improved out of 20/60 and vastly improved macular thickening as the retinal thickening resolves post vitrectomy

## 2020-04-02 NOTE — Patient Instructions (Signed)
Follow-up no new visual acuity distortions or declines

## 2020-04-02 NOTE — Progress Notes (Signed)
04/02/2020     CHIEF COMPLAINT Patient presents for Post-op Follow-up   HISTORY OF PRESENT ILLNESS: Justin [name] is a 74 y.o. male who presents to the clinic today for:   HPI    Post-op Follow-up    In right eye.  Vision is improved.          Comments    Post Op OD  Pt sts VA OD fluctuates off and on. VA OS stable. No ocular pain reported OU.       Last edited by Rockie Neighbours, [place] on 04/02/2020  9:45 AM. (History)      Referring physician: [facility], [place] [address]  HISTORICAL INFORMATION:   Selected notes from the MEDICAL RECORD NUMBER    Lab Results  Component Value Date   HGBA1C 6.1 (H) 01/28/2020     CURRENT MEDICATIONS: No current outpatient medications on file. (Ophthalmic Drugs)   No current facility-administered medications for this visit. (Ophthalmic Drugs)   Current Outpatient Medications (Other)  Medication Sig  . allopurinol (ZYLOPRIM) 300 MG tablet Take 300 mg by mouth daily.  Marland Kitchen atorvastatin (LIPITOR) 40 MG tablet Take 40 mg by mouth daily.   Marland Kitchen doxycycline (VIBRAMYCIN) 100 MG capsule Take 1 capsule (100 mg total) by mouth 2 (two) times daily.  Marland Kitchen ELIQUIS 5 MG TABS tablet Take 5 mg by mouth 2 (two) times daily.  . metoprolol tartrate (LOPRESSOR) 25 MG tablet Take 1 tablet (25 mg total) by mouth 2 (two) times daily.  Marland Kitchen omeprazole (PRILOSEC) 20 MG capsule Take 20 mg by mouth every morning.    No current facility-administered medications for this visit. (Other)      REVIEW OF SYSTEMS:    ALLERGIES No Known Allergies  PAST MEDICAL HISTORY Past Medical History:  Diagnosis Date  . Anemia   . Anemia in chronic kidney disease 11/20/2015  . Cancer [facility]) 2009   Prostate; radiation seeds  . Chronic kidney disease    stage 4  . Diabetes mellitus   . Gout   . Hypertension    Past Surgical History:  Procedure Laterality Date  . A/V FISTULAGRAM Left 08/17/2017   Procedure: A/V FISTULAGRAM;   Surgeon: Katha Cabal, MD;  Location: [facility];  Service: Cardiovascular;  Laterality: Left;  . A/V SHUNT INTERVENTION N/A 08/17/2017   Procedure: A/V SHUNT INTERVENTION;  Surgeon: Katha Cabal, MD;  Location: [place] [facility];  Service: Cardiovascular;  Laterality: N/A;  . AV FISTULA PLACEMENT Left 05/31/2015   Procedure: ARTERIOVENOUS (AV) FISTULA CREATION;  Surgeon: Katha Cabal, MD;  Location: [facility];  Service: Vascular;  Laterality: Left;  . excision bone spurs Bilateral 1989   feet  . KNEE SURGERY Left 1998   arthroscopy  . SHOULDER SURGERY Left 1994   rotator cuff    FAMILY HISTORY Family History  Problem Relation Age of Onset  . Kidney disease Mother   . Alcoholism Father   . Alcoholism Brother     SOCIAL HISTORY Social History   Tobacco Use  . Smoking status: Former Smoker    Quit date: 10/01/1978    Years since quitting: 41.5  . Smokeless tobacco: Never Used  Substance Use Topics  . Alcohol use: No  . Drug use: No         OPHTHALMIC EXAM:  Base Eye Exam    Visual Acuity (ETDRS)      Right Left   Dist [place] 20/60 -  2 20/25 -1   Dist ph [place] NI        Tonometry (Tonopen, 9:46 AM)      Right Left   Pressure 11 12       Pupils      Dark Light Shape React APD   Right 3 2 Round Slow None   Left 3 2 Irregular Slow None       Visual Fields (Counting fingers)      Left Right    Full Full       Extraocular Movement      Right Left    Full Full       Neuro/Psych    Oriented x3: Yes   Mood/Affect: Normal       Dilation    Right eye: 1.0% Mydriacyl, 2.5% Phenylephrine @ 9:49 AM        Slit Lamp and Fundus Exam    External Exam      Right Left   External Normal Normal       Slit Lamp Exam      Right Left   Lids/Lashes Normal Normal   Conjunctiva/Sclera White and quiet White and quiet   Cornea Clear Clear   Anterior Chamber Deep and quiet Deep and quiet   Iris Round and reactive Round and reactive   Lens  Posterior chamber intraocular lens    Anterior Vitreous Normal Normal       Fundus Exam      Right Left   Posterior Vitreous Vitrectomized, clear,     Disc Normal    C/D Ratio 0.35    Macula  No topographic distortion    Vessels PDR-quiet    Periphery Good PRP peripherally           IMAGING AND PROCEDURES  Imaging and Procedures for 04/02/20  OCT, Retina - OU - Both Eyes       Right Eye Quality was good. Scan locations included subfoveal. Central Foveal Thickness: 416. Progression has improved.   Left Eye Quality was good. Scan locations included subfoveal. Central Foveal Thickness: 247. Progression has been stable.   Notes OD, now 2-1/2 months status post vitrectomy, membrane peel for severe epiretinal membrane with counting years vision.  Visual acuity is much improved out of 20/60 and vastly improved macular thickening as the retinal thickening resolves post vitrectomy                ASSESSMENT/PLAN:  Macular pucker, right eye OD, now 2-1/2 months status post vitrectomy, membrane peel for severe epiretinal membrane with counting years vision.  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 The patient was advised to maintain good glucose, blood pressure, monitor kidney function and serum lipid control as advised by personal physician. Rare risk for reactivation of progression exist with untreated severe anemia, untreated renal failure, untreated heart failure, and smoking. Complete avoidance of smoking was recommended. The chance of recurrent proliferative diabetic retinopathy was discussed as well as the chance of vitreous hemorrhage for which further treatments may be  necessary.   Explained to the patient that the quiescent  proliferative diabetic retinopathy disease is unlikely to ever worsen.  Worsening factors would include however severe anemia, hypertension out-of-control or impending renal failure.  ICD-10-CM   1. Macular pucker, right eye  H35.371 OCT, Retina - OU - Both Eyes  2. 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 OD, now 2-1/2 months status post vitrectomy, membrane peel for severe epiretinal membrane with counting years vision.  Visual acuity is much improved out of 20/60 and vastly improved macular thickening as the retinal thickening resolves post vitrectomy  2.  Continue to monitor quiescent proliferative diabetic retinopathy OU  3.  Follow-up with Dr. Wyatt Portela as scheduled  Ophthalmic Meds Ordered this visit:  No orders of the defined types were placed in this encounter.      Return in about 9 months (around 12/31/2020) for DILATE OU, COLOR FP, OCT.  Patient Instructions  Follow-up no new visual acuity distortions or declines    Explained the diagnoses, plan, and follow up with the patient and they expressed understanding.  Patient expressed understanding of the importance of proper follow up care.   Clent Demark Arriah Wadle M.D. Diseases & Surgery of the Retina and Vitreous Retina & Diabetic [place] 04/02/20     Abbreviations: M myopia (nearsighted); A astigmatism; H hyperopia (farsighted); P presbyopia; Mrx spectacle prescription;  CTL contact lenses; OD right eye; OS left eye; OU both eyes  XT exotropia; ET esotropia; PEK punctate epithelial keratitis; PEE punctate epithelial erosions; DES dry eye syndrome; MGD meibomian gland dysfunction; ATs artificial tears; PFAT's preservative free artificial tears; [place] nuclear sclerotic cataract; PSC posterior subcapsular cataract; ERM epi-retinal membrane; PVD posterior vitreous detachment;  RD retinal detachment; DM diabetes mellitus; DR diabetic retinopathy; NPDR non-proliferative diabetic retinopathy; PDR proliferative diabetic retinopathy; CSME clinically significant macular edema; DME diabetic macular edema; dbh dot blot hemorrhages; CWS cotton wool spot; POAG primary open angle glaucoma; C/D cup-to-disc ratio; HVF humphrey visual field; GVF goldmann visual field; OCT optical coherence tomography; IOP intraocular pressure; BRVO Branch retinal vein occlusion; CRVO central retinal vein occlusion; CRAO central retinal artery occlusion; BRAO branch retinal artery occlusion; RT retinal tear; SB scleral buckle; PPV pars plana vitrectomy; VH Vitreous hemorrhage; PRP panretinal laser photocoagulation; IVK intravitreal kenalog; VMT vitreomacular traction; MH Macular hole;  NVD neovascularization of the disc; NVE neovascularization elsewhere; AREDS age related eye disease study; ARMD age related macular degeneration; POAG primary open angle glaucoma; EBMD epithelial/anterior basement membrane dystrophy; ACIOL anterior chamber intraocular lens; IOL intraocular lens; PCIOL posterior chamber intraocular lens; Phaco/IOL phacoemulsification with intraocular lens placement; [place] photorefractive keratectomy; LASIK laser assisted in situ keratomileusis; HTN hypertension; DM diabetes mellitus; COPD chronic obstructive pulmonary disease

## 2020-05-27 ENCOUNTER — Emergency Department (HOSPITAL_COMMUNITY): Payer: Medicare Other

## 2020-05-27 ENCOUNTER — Other Ambulatory Visit: Payer: Self-pay

## 2020-05-27 ENCOUNTER — Emergency Department (HOSPITAL_COMMUNITY)
Admission: EM | Admit: 2020-05-27 | Discharge: 2020-05-27 | Disposition: A | Discharge status: Home / Self Care | Payer: Medicare Other | Attending: Emergency Medicine | Admitting: Emergency Medicine

## 2020-05-27 ENCOUNTER — Encounter (HOSPITAL_COMMUNITY): Payer: Self-pay

## 2020-05-27 DIAGNOSIS — E1122 Type 2 diabetes mellitus with diabetic chronic kidney disease: Secondary | ICD-10-CM | POA: Diagnosis not present

## 2020-05-27 DIAGNOSIS — N186 End stage renal disease: Secondary | ICD-10-CM | POA: Insufficient documentation

## 2020-05-27 DIAGNOSIS — Z7901 Long term (current) use of anticoagulants: Secondary | ICD-10-CM | POA: Insufficient documentation

## 2020-05-27 DIAGNOSIS — I120 Hypertensive chronic kidney disease with stage 5 chronic kidney disease or end stage renal disease: Secondary | ICD-10-CM | POA: Diagnosis not present

## 2020-05-27 DIAGNOSIS — Z87891 Personal history of nicotine dependence: Secondary | ICD-10-CM | POA: Insufficient documentation

## 2020-05-27 DIAGNOSIS — R310 Gross hematuria: Secondary | ICD-10-CM | POA: Insufficient documentation

## 2020-05-27 DIAGNOSIS — Z8546 Personal history of malignant neoplasm of prostate: Secondary | ICD-10-CM | POA: Diagnosis not present

## 2020-05-27 DIAGNOSIS — Z992 Dependence on renal dialysis: Secondary | ICD-10-CM | POA: Diagnosis not present

## 2020-05-27 DIAGNOSIS — Z79899 Other long term (current) drug therapy: Secondary | ICD-10-CM | POA: Diagnosis not present

## 2020-05-27 LAB — URINALYSIS, ROUTINE W REFLEX MICROSCOPIC
Bilirubin Urine: NEGATIVE
Glucose, UA: 50 mg/dL — AB
Ketones, ur: NEGATIVE mg/dL
Nitrite: NEGATIVE
Protein, ur: 100 mg/dL — AB
RBC / HPF: >50 RBC/hpf — ABNORMAL HIGH (ref 0–5)
Specific Gravity, Urine: 1.017 (ref 1.005–1.030)
pH: 6 (ref 5.0–8.0)

## 2020-05-27 LAB — CBC WITH DIFFERENTIAL/PLATELET
Abs Immature Granulocytes: 0.03 10*3/uL (ref 0.00–0.07)
Basophils Absolute: 0 10*3/uL (ref 0.0–0.1)
Basophils Relative: 0 %
Eosinophils Absolute: 0.2 10*3/uL (ref 0.0–0.5)
Eosinophils Relative: 2 %
HCT: 35.4 % — ABNORMAL LOW (ref 39.0–52.0)
Hemoglobin: 10.8 g/dL — ABNORMAL LOW (ref 13.0–17.0)
Immature Granulocytes: 0 %
Lymphocytes Relative: 18 %
Lymphs Abs: 1.7 10*3/uL (ref 0.7–4.0)
MCH: 31 pg (ref 26.0–34.0)
MCHC: 30.5 g/dL (ref 30.0–36.0)
MCV: 101.7 fL — ABNORMAL HIGH (ref 80.0–100.0)
Monocytes Absolute: 1 10*3/uL (ref 0.1–1.0)
Monocytes Relative: 11 %
Neutro Abs: 6.6 10*3/uL (ref 1.7–7.7)
Neutrophils Relative %: 69 %
Platelets: 168 10*3/uL (ref 150–400)
RBC: 3.48 MIL/uL — ABNORMAL LOW (ref 4.22–5.81)
RDW: 15.3 % (ref 11.5–15.5)
WBC: 9.6 10*3/uL (ref 4.0–10.5)
nRBC: 0 % (ref 0.0–0.2)

## 2020-05-27 LAB — BASIC METABOLIC PANEL
Anion gap: 11 (ref 5–15)
BUN: 18 mg/dL (ref 8–23)
CO2: 34 mmol/L — ABNORMAL HIGH (ref 22–32)
Calcium: 8.3 mg/dL — ABNORMAL LOW (ref 8.9–10.3)
Chloride: 94 mmol/L — ABNORMAL LOW (ref 98–111)
Creatinine, Ser: 5.94 mg/dL — ABNORMAL HIGH (ref 0.61–1.24)
GFR, Estimated: 9 mL/min — ABNORMAL LOW (ref >60)
Glucose, Bld: 109 mg/dL — ABNORMAL HIGH (ref 70–99)
Potassium: 3.4 mmol/L — ABNORMAL LOW (ref 3.5–5.1)
Sodium: 139 mmol/L (ref 135–145)

## 2020-05-27 LAB — PROTIME-INR
INR: 1.1 (ref 0.8–1.2)
Prothrombin Time: 13.6 seconds (ref 11.4–15.2)

## 2020-05-27 NOTE — ED Triage Notes (Signed)
Pt presents to ED with complaints of blood in his urine started this am. Pt denies pain.

## 2020-05-27 NOTE — Discharge Instructions (Addendum)
Decrease Eliquis in half tonight.  If you still see blood in your urine tomorrow then decrease it in half tomorrow also.

## 2020-05-27 NOTE — ED Notes (Signed)
PT is aware we need urine sample, urinal at bedside.  

## 2020-05-27 NOTE — ED Provider Notes (Signed)
[facility] EMERGENCY DEPARTMENT Provider Note   CSN: [id number] Arrival date & time: 05/27/20  1107     History Chief Complaint  Patient presents with  . Hematuria    Justin [name] is a 74 y.o. male.  Pt presents to the ED today with hematuria.  Pt is a dialysis patient and does not urinate much.  He did urinate this am and saw some blood in his urine.  He denies any pain.  He is on Eliquis for aflutter.  He denies any trauma.  He otherwise feels well.  CHA2DS2/VAS Stroke Risk Points  Current as of 2 minutes ago     4 >= 2 Points: High Risk  1 - 1.99 Points: Medium Risk  0 Points: [name] Risk    No Change      Details    This score determines the patient's risk of having a stroke if the  patient has atrial fibrillation.       Points Metrics  0 Has Congestive Heart Failure:  No    Current as of 2 minutes ago  0 Has Vascular Disease:  No    Current as of 2 minutes ago  1 Has Hypertension:  Yes    Current as of 2 minutes ago  2 Age:  25    Current as of 2 minutes ago  1 Has Diabetes:  Yes    Current as of 2 minutes ago  0 Had Stroke:  No  Had TIA:  No  Had thromboembolism:  No    Current as of 2 minutes ago  0 Male:  No    Current as of 2 minutes ago                 Past Medical History:  Diagnosis Date  . Anemia   . Anemia in chronic kidney disease 11/20/2015  . Cancer [facility]) 2009   Prostate; radiation seeds  . Chronic kidney disease    stage 4  . Diabetes mellitus   . Gout   . Hypertension     Patient Active Problem List   Diagnosis Date Noted  . Sepsis ([place])   . Atrial flutter ([place]) 01/29/2020  . Abscess   . Wound infection 01/28/2020  . 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 H/O vitrectomy 12/07/2019  . Macular pucker, 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  . Follow-up examination after eye surgery 10/24/2019  . Vitreous hemorrhage of right eye ([place]) 10/24/2019  .  Proliferative diabetic retinopathy of right eye ([place]) 10/24/2019  . Proliferative diabetic retinopathy of left eye ([place]) 10/24/2019  . ESRD on dialysis ([place]) 07/29/2017  . Anemia in chronic kidney disease 11/20/2015  . Blurred vision, left eye 07/10/2011  . HTN (hypertension) 07/10/2011  . DM (diabetes mellitus) ([place]) 07/10/2011  . CKD (chronic kidney disease) 07/10/2011    Past Surgical History:  Procedure Laterality Date  . A/V FISTULAGRAM Left 08/17/2017   Procedure: A/V FISTULAGRAM;  Surgeon: Katha Cabal, MD;  Location: [facility];  Service: Cardiovascular;  Laterality: Left;  . A/V SHUNT INTERVENTION N/A 08/17/2017   Procedure: A/V SHUNT INTERVENTION;  Surgeon: Katha Cabal, MD;  Location: [facility];  Service: Cardiovascular;  Laterality: N/A;  . AV FISTULA PLACEMENT Left 05/31/2015   Procedure: ARTERIOVENOUS (AV) FISTULA CREATION;  Surgeon: Katha Cabal, MD;  Location: [facility] ORS;  Service: Vascular;  Laterality: Left;  . excision bone spurs  Bilateral 1989   feet  . KNEE SURGERY Left 1998   arthroscopy  . SHOULDER SURGERY Left 1994   rotator cuff       Family History  Problem Relation Age of Onset  . Kidney disease Mother   . Alcoholism Father   . Alcoholism Brother     Social History   Tobacco Use  . Smoking status: Former Smoker    Quit date: 10/01/1978    Years since quitting: 41.6  . Smokeless tobacco: Never Used  Substance Use Topics  . Alcohol use: No  . Drug use: No    Home Medications Prior to Admission medications   Medication Sig Start Date End Date Taking? Authorizing Provider  allopurinol (ZYLOPRIM) 300 MG tablet Take 300 mg by mouth daily. 10/13/19   [provider]  atorvastatin (LIPITOR) 40 MG tablet Take 40 mg by mouth daily.  10/30/14   [provider]  doxycycline (VIBRAMYCIN) 100 MG capsule Take 1 capsule (100 mg total) by mouth 2 (two) times daily. 02/01/20   Johnson, Clanford L, MD  ELIQUIS 5  MG TABS tablet Take 5 mg by mouth 2 (two) times daily. 10/07/19   [provider]  metoprolol tartrate (LOPRESSOR) 25 MG tablet Take 1 tablet (25 mg total) by mouth 2 (two) times daily. 02/01/20 03/02/20  Johnson, Clanford L, MD  omeprazole (PRILOSEC) 20 MG capsule Take 20 mg by mouth every morning.     [provider]    Allergies    Patient has no known allergies.  Review of Systems   Review of Systems  Genitourinary: Positive for hematuria.  All other systems reviewed and are negative.   Physical Exam Updated Vital Signs BP 129/69   Pulse 85   Temp 97.9 F (36.6 C) (Oral)   Resp 14   Ht 5\' 10"  (1.778 m)   Wt 104.3 kg   SpO2 99%   BMI 33.00 kg/m   Physical Exam Vitals and nursing note reviewed.  Constitutional:      Appearance: Normal appearance.  HENT:     Head: Normocephalic and atraumatic.     Right Ear: External ear normal.     Left Ear: External ear normal.     Nose: Nose normal.     Mouth/Throat:     Mouth: Mucous membranes are moist.     Pharynx: Oropharynx is clear.  Eyes:     Extraocular Movements: Extraocular movements intact.     Conjunctiva/sclera: Conjunctivae normal.     Pupils: Pupils are equal, round, and reactive to light.  Cardiovascular:     Rate and Rhythm: Normal rate and regular rhythm.     Pulses: Normal pulses.     Heart sounds: Normal heart sounds.  Pulmonary:     Effort: Pulmonary effort is normal.     Breath sounds: Normal breath sounds.  Abdominal:     General: Abdomen is flat. Bowel sounds are normal.     Palpations: Abdomen is soft.  Musculoskeletal:        General: Normal range of motion.     Cervical back: Normal range of motion and neck supple.     Comments: AV fistula left arm with good thrill  Skin:    General: Skin is warm.     Capillary Refill: Capillary refill takes less than 2 seconds.  Neurological:     General: No focal deficit present.     Mental Status: He is alert and oriented to person, place,  and time.  Psychiatric:        Mood and Affect: Mood normal.        Behavior: Behavior normal.     ED Results / Procedures / Treatments   Labs (all labs ordered are listed, but only abnormal results are displayed) Labs Reviewed  BASIC METABOLIC PANEL - Abnormal; Notable for the following components:      Result Value   Potassium 3.4 (*)    Chloride 94 (*)    CO2 34 (*)    Glucose, Bld 109 (*)    Creatinine, Ser 5.94 (*)    Calcium 8.3 (*)    GFR, Estimated 9 (*)    All other components within normal limits  CBC WITH DIFFERENTIAL/PLATELET - Abnormal; Notable for the following components:   RBC 3.48 (*)    Hemoglobin 10.8 (*)    HCT 35.4 (*)    MCV 101.7 (*)    All other components within normal limits  URINALYSIS, ROUTINE W REFLEX MICROSCOPIC - Abnormal; Notable for the following components:   Color, Urine AMBER (*)    APPearance CLOUDY (*)    Glucose, UA 50 (*)    Hgb urine dipstick LARGE (*)    Protein, ur 100 (*)    Leukocytes,Ua SMALL (*)    RBC / HPF >50 (*)    Bacteria, UA RARE (*)    All other components within normal limits  URINE CULTURE  PROTIME-INR    EKG None  Radiology CT Renal Stone Study  Result Date: 05/27/2020 CLINICAL DATA:  Hematuria EXAM: CT ABDOMEN AND PELVIS WITHOUT CONTRAST TECHNIQUE: Multidetector CT imaging of the abdomen and pelvis was performed following the standard protocol without IV contrast. COMPARISON:  July 2021 FINDINGS: Lower chest: No acute abnormality. Hepatobiliary: No focal liver abnormality is seen. No gallstones, gallbladder wall thickening, or biliary dilatation. Pancreas: Unremarkable. Spleen: Unremarkable. Adrenals/Urinary Tract: Partially distended bladder with circumferential wall thickening. Mildly increased density of bladder contents likely reflects presence of blood. Mild right hydroureteronephrosis. No obstructing calculus identified. Mild right perinephric stranding. Too small to characterize renal lesions. Adrenals  are unremarkable. Stomach/Bowel: Possible thickening of the posterior gastric wall. Bowel is normal in caliber. Normal appendix. Vascular/Lymphatic: Aortic atherosclerosis. No enlarged lymph nodes. Reproductive: Enlarged prostate. Other: Residual soft tissue thickening at the location of prior subcutaneous abscess at the lumbosacral junction. No ascites. Fat containing umbilical hernia. Musculoskeletal: Renal osteodystrophy. IMPRESSION: Mild right hydroureteronephrosis without obstructing calculus. May reflect recently passed stone. Mildly increased density of the bladder contents likely reflects presence of blood. Unchanged bladder wall thickening likely reflects chronic bladder outlet obstruction due to enlarged prostate. Question focal thickening of the posterior gastric wall. This is poorly evaluated on this study. Consider nonemergent upper GI evaluation. Electronically Signed   By: Macy Mis M.D.   On: 05/27/2020 13:19    Procedures Procedures (including critical care time)  Medications Ordered in ED Medications - No data to display  ED Course  I have reviewed the triage vital signs and the nursing notes.  Pertinent labs & imaging results that were available during my care of the patient were reviewed by me and considered in my medical decision making (see chart for details).    MDM Rules/Calculators/A&P                         CT urogram looked like he passed a kidney stone.  Urine shows no UTI, but urine sent for cx.  Pt is to decrease his  Eliquis in half tonight and tomorrow.  He is to f/u with urology.  Return if worse.  Final Clinical Impression(s) / ED Diagnoses Final diagnoses:  Gross hematuria  ESRD on hemodialysis ([place])  On apixaban therapy    Rx / DC Orders ED Discharge Orders    None       Isla Pence, MD 05/27/20 1530

## 2020-05-28 LAB — URINE CULTURE: Culture: NO GROWTH

## 2020-07-13 DIAGNOSIS — I440 Atrioventricular block, first degree: Secondary | ICD-10-CM | POA: Insufficient documentation

## 2020-12-31 ENCOUNTER — Encounter (INDEPENDENT_AMBULATORY_CARE_PROVIDER_SITE_OTHER): Payer: Medicare Other | Admitting: Ophthalmology

## 2021-01-01 ENCOUNTER — Other Ambulatory Visit (HOSPITAL_COMMUNITY): Payer: Self-pay | Admitting: Internal Medicine

## 2021-01-01 ENCOUNTER — Other Ambulatory Visit: Payer: Self-pay | Admitting: Internal Medicine

## 2021-01-01 DIAGNOSIS — R0989 Other specified symptoms and signs involving the circulatory and respiratory systems: Secondary | ICD-10-CM

## 2021-01-02 ENCOUNTER — Other Ambulatory Visit: Payer: Self-pay | Admitting: Internal Medicine

## 2021-01-02 ENCOUNTER — Encounter (INDEPENDENT_AMBULATORY_CARE_PROVIDER_SITE_OTHER): Payer: Medicare Other | Admitting: Ophthalmology

## 2021-01-08 ENCOUNTER — Other Ambulatory Visit: Payer: Self-pay | Admitting: Internal Medicine

## 2021-01-08 DIAGNOSIS — R0989 Other specified symptoms and signs involving the circulatory and respiratory systems: Secondary | ICD-10-CM

## 2021-01-10 ENCOUNTER — Other Ambulatory Visit: Payer: Self-pay | Admitting: Internal Medicine

## 2021-01-10 DIAGNOSIS — R0989 Other specified symptoms and signs involving the circulatory and respiratory systems: Secondary | ICD-10-CM

## 2021-01-16 ENCOUNTER — Other Ambulatory Visit: Payer: Medicare Other

## 2021-01-23 ENCOUNTER — Inpatient Hospital Stay: Admission: RE | Admit: 2021-01-23 | Payer: Medicare Other | Source: Ambulatory Visit

## 2021-01-23 ENCOUNTER — Other Ambulatory Visit: Payer: Medicare Other

## 2021-02-27 ENCOUNTER — Ambulatory Visit
Admission: RE | Admit: 2021-02-27 | Discharge: 2021-02-27 | Disposition: A | Discharge status: Home / Self Care | Payer: Medicare Other | Source: Ambulatory Visit | Attending: Internal Medicine | Admitting: Internal Medicine

## 2021-02-27 ENCOUNTER — Encounter (INDEPENDENT_AMBULATORY_CARE_PROVIDER_SITE_OTHER): Payer: Self-pay | Admitting: Ophthalmology

## 2021-02-27 ENCOUNTER — Ambulatory Visit (INDEPENDENT_AMBULATORY_CARE_PROVIDER_SITE_OTHER): Payer: Medicare Other | Admitting: Ophthalmology

## 2021-02-27 ENCOUNTER — Other Ambulatory Visit: Payer: Self-pay

## 2021-02-27 DIAGNOSIS — E113551 Type 2 diabetes mellitus with stable proliferative diabetic retinopathy, right eye: Secondary | ICD-10-CM

## 2021-02-27 DIAGNOSIS — R0989 Other specified symptoms and signs involving the circulatory and respiratory systems: Secondary | ICD-10-CM

## 2021-02-27 DIAGNOSIS — H26491 Other secondary cataract, right eye: Secondary | ICD-10-CM | POA: Diagnosis not present

## 2021-02-27 DIAGNOSIS — H35371 Puckering of macula, right eye: Secondary | ICD-10-CM | POA: Diagnosis not present

## 2021-02-27 DIAGNOSIS — E113511 Type 2 diabetes mellitus with proliferative diabetic retinopathy with macular edema, right eye: Secondary | ICD-10-CM | POA: Diagnosis not present

## 2021-02-27 DIAGNOSIS — E113552 Type 2 diabetes mellitus with stable proliferative diabetic retinopathy, left eye: Secondary | ICD-10-CM

## 2021-02-27 DIAGNOSIS — H4311 Vitreous hemorrhage, right eye: Secondary | ICD-10-CM

## 2021-02-27 HISTORY — DX: Other secondary cataract, right eye: H26.491

## 2021-02-27 NOTE — Assessment & Plan Note (Addendum)
Cloudy which patient complains of some blurred vision remaining in the right eye.  Will recommend and perform YAG capsulotomy right eye today in order to improve and maximize patient's visual acuity

## 2021-02-27 NOTE — Assessment & Plan Note (Signed)
Vastly improved OD and stable PDR

## 2021-02-27 NOTE — Assessment & Plan Note (Signed)
PDR OD

## 2021-02-27 NOTE — Assessment & Plan Note (Signed)
PDR OS now quiescent, clear media

## 2021-02-27 NOTE — Assessment & Plan Note (Signed)
Cleared post vitrectomy will resolve

## 2021-02-27 NOTE — Assessment & Plan Note (Signed)
No residual epiretinal membrane, and vastly improved macular thickening by OCT volumetric and linear scan

## 2021-02-27 NOTE — Progress Notes (Signed)
02/27/2021     CHIEF COMPLAINT Patient presents for Retina Follow Up  10 mos fu ou fp Patient states vision is stable since last visit. Denies any new floaters or FOL. Pt states he still has the flash of light in the right eye, he notices it frequently but no more than it was before. A1C: 6.1 01/28/20  HISTORY OF PRESENT ILLNESS: Justin [name] is a 75 y.o. male who presents to the clinic today for:   HPI     Retina Follow Up           Diagnosis: Other   Laterality: both eyes   Onset: 10 months ago   Severity: mild   Duration: 10 months   Course: stable         Comments   10 mos fu ou fp Patient states vision is stable since last visit. Denies any new floaters or FOL. Pt states he still has the flash of light in the right eye, he notices it frequently but no more than it was before. A1C: 6.1 01/28/20 BS: Yesterday 113, does not check every morning.        Last edited by Laurin Coder, COA on 02/27/2021  8:21 AM.      Referring physician: [facility], [place] [address]  HISTORICAL INFORMATION:   Selected notes from the MEDICAL RECORD NUMBER    Lab Results  Component Value Date   HGBA1C 6.1 (H) 01/28/2020     CURRENT MEDICATIONS: No current outpatient medications on file. (Ophthalmic Drugs)   No current facility-administered medications for this visit. (Ophthalmic Drugs)   Current Outpatient Medications (Other)  Medication Sig   allopurinol (ZYLOPRIM) 300 MG tablet Take 300 mg by mouth daily.   atorvastatin (LIPITOR) 40 MG tablet Take 40 mg by mouth daily.    doxycycline (VIBRAMYCIN) 100 MG capsule Take 1 capsule (100 mg total) by mouth 2 (two) times daily.   ELIQUIS 5 MG TABS tablet Take 5 mg by mouth 2 (two) times daily.   metoprolol tartrate (LOPRESSOR) 25 MG tablet Take 1 tablet (25 mg total) by mouth 2 (two) times daily.   omeprazole (PRILOSEC) 20 MG capsule Take 20 mg by mouth every morning.    No  current facility-administered medications for this visit. (Other)      REVIEW OF SYSTEMS:    ALLERGIES No Known Allergies  PAST MEDICAL HISTORY Past Medical History:  Diagnosis Date   Anemia    Anemia in chronic kidney disease 11/20/2015   Cancer ([place]) 2009   Prostate; radiation seeds   Chronic kidney disease    stage 4   Diabetes mellitus    Gout    Hypertension    Vitreous hemorrhage of right eye ([place]) 10/24/2019   Past Surgical History:  Procedure Laterality Date   A/V FISTULAGRAM Left 08/17/2017   Procedure: A/V FISTULAGRAM;  Surgeon: Katha Cabal, MD;  Location: [facility];  Service: Cardiovascular;  Laterality: Left;   A/V SHUNT INTERVENTION N/A 08/17/2017   Procedure: A/V SHUNT INTERVENTION;  Surgeon: Katha Cabal, MD;  Location: [facility];  Service: Cardiovascular;  Laterality: N/A;   AV FISTULA PLACEMENT Left 05/31/2015   Procedure: ARTERIOVENOUS (AV) FISTULA CREATION;  Surgeon: Katha Cabal, MD;  Location: [facility];  Service: Vascular;  Laterality: Left;   excision bone spurs Bilateral 1989   feet   KNEE SURGERY Left 1998   arthroscopy   SHOULDER SURGERY  Left 1994   rotator cuff    FAMILY HISTORY Family History  Problem Relation Age of Onset   Kidney disease Mother    Alcoholism Father    Alcoholism Brother     SOCIAL HISTORY Social History   Tobacco Use   Smoking status: Former    Types: Cigarettes    Quit date: 10/01/1978    Years since quitting: 42.4   Smokeless tobacco: Never  Substance Use Topics   Alcohol use: No   Drug use: No         OPHTHALMIC EXAM:  Base Eye Exam     Visual Acuity (Snellen - Linear)       Right Left   Dist [place] 20/50 -1 20/25 -1+2   Dist ph  NI          Tonometry (Tonopen, 8:10 AM)       Right Left   Pressure 13 9         Pupils       Pupils Dark Light Shape React APD   Right PERRL 3 2 Round Brisk None   Left PERRL   Irregular fixed None         Visual  Fields (Counting fingers)       Left Right    Full Full         Extraocular Movement       Right Left    Full Full         Neuro/Psych     Oriented x3: Yes   Mood/Affect: Normal         Dilation     Both eyes: 1.0% Mydriacyl, 2.5% Phenylephrine @ 8:10 AM           Slit Lamp and Fundus Exam     External Exam       Right Left   External Normal Normal         Slit Lamp Exam       Right Left   Lids/Lashes Normal Normal   Conjunctiva/Sclera White and quiet White and quiet   Cornea Clear Clear   Anterior Chamber Deep and quiet Deep and quiet   Iris Round and reactive Round and reactive   Lens Posterior chamber intraocular lens    Anterior Vitreous Normal Normal         Fundus Exam       Right Left   Posterior Vitreous Vitrectomized, clear,     Disc Normal    C/D Ratio 0.35    Macula  No topographic distortion    Vessels PDR-quiet    Periphery Good PRP peripherally             IMAGING AND PROCEDURES  Imaging and Procedures for 02/27/21  OCT, Retina - OU - Both Eyes       Right Eye Quality was good. Scan locations included subfoveal. Central Foveal Thickness: 338. Progression has improved.   Left Eye Quality was good. Scan locations included subfoveal. Central Foveal Thickness: 248. Progression has been stable.   Notes OD, now 14 months status post vitrectomy, membrane peel for severe epiretinal membrane with counting years vision.  Visual acuity is much improved out of 20/60 and vastly improved macular thickening as the retinal thickening resolves post vitrectomy     Color Fundus Photography Optos - OU - Both Eyes       Right Eye Progression has worsened. Disc findings include normal observations. Macula : microaneurysms.   Left Eye Progression has worsened.  Macula : microaneurysms.   Notes OD with CSME.   Minor topographic distortion from epiretinal membrane present OD.  Good panretinal photocoagulation 360 degrees.  OS  similar findings with minor CME       Yag Capsulotomy - OD - Right Eye       Time Out Confirmed correct patient, procedure, site, and patient consented.   Anesthesia Topical anesthesia was used. Anesthesia medications included Proparacaine, Alphagan 0.15%.   Laser Information The type of laser was yag. Total spots was 32. The energy was 1 mj. Total energy was 32 mj.   Post-op The patient tolerated the procedure well. There were no complications. The patient received written and verbal post procedure care education.              ASSESSMENT/PLAN:  Right posterior capsular opacification Cloudy which patient complains of some blurred vision remaining in the right eye.  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 Right posterior capsular opacification  H26.491 Yag Capsulotomy - OD - Right Eye    3. Macular pucker, right eye  H35.371     4. Stable proliferative diabetic retinopathy of right eye associated with type 2 diabetes mellitus ([place])  E11.3551     5. Stable proliferative diabetic retinopathy of left eye associated with type 2 diabetes mellitus ([place])  W62.0355      6. Vitreous hemorrhage of right eye (HCC)  H43.11       1.  OD vastly improved acuity now to 20/50 post vitrectomy membrane peel for severe epiretinal membrane macular thickening June 2021.  Count fingers vision now vastly improved to 20/50 OD, PDR now quiet.  Mildly media opacity from the posterior capsule opacification progression with performing YAG laser capsulotomy today per patient request to maximize visual potential  2.  OU quiet PDR observe  3.  Ophthalmic Meds Ordered this visit:  No orders of the defined types were placed in this encounter.      Return in about 3 months (around 05/30/2021) for DILATE OU, COLOR FP, OCT.  There are no Patient Instructions on file for this visit.   Explained the diagnoses, plan, and follow up with the patient and they expressed understanding.  Patient expressed understanding of the importance of proper follow up care.   Clent Demark Annslee Tercero M.D. Diseases & Surgery of the Retina and Vitreous Retina & Diabetic [place] 02/27/21     Abbreviations: M myopia (nearsighted); A astigmatism; H hyperopia (farsighted); P presbyopia; Mrx spectacle prescription;  CTL contact lenses; OD right eye; OS left eye; OU both eyes  XT exotropia; ET esotropia; PEK punctate epithelial keratitis; PEE punctate epithelial erosions; DES dry eye syndrome; MGD meibomian gland dysfunction; ATs  artificial tears; PFAT's preservative free artificial tears; [place]son nuclear sclerotic cataract; PSC posterior subcapsular cataract; ERM epi-retinal membrane; PVD posterior vitreous detachment; RD retinal detachment; DM diabetes mellitus; DR diabetic retinopathy; NPDR non-proliferative diabetic retinopathy; PDR proliferative diabetic retinopathy; CSME clinically significant macular edema; DME diabetic macular edema; dbh dot blot hemorrhages; CWS cotton wool spot; POAG primary open angle glaucoma; C/D cup-to-disc ratio; HVF humphrey visual field; GVF goldmann visual field; OCT optical  coherence tomography; IOP intraocular pressure; BRVO Branch retinal vein occlusion; CRVO central retinal vein occlusion; CRAO central retinal artery occlusion; BRAO branch retinal artery occlusion; RT retinal tear; SB scleral buckle; PPV pars plana vitrectomy; VH Vitreous hemorrhage; PRP panretinal laser photocoagulation; IVK intravitreal kenalog; VMT vitreomacular traction; MH Macular hole;  NVD neovascularization of the disc; NVE neovascularization elsewhere; AREDS age related eye disease study; ARMD age related macular degeneration; POAG primary open angle glaucoma; EBMD epithelial/anterior basement membrane dystrophy; ACIOL anterior chamber intraocular lens; IOL intraocular lens; PCIOL posterior chamber intraocular lens; Phaco/IOL phacoemulsification with intraocular lens placement; [place] photorefractive keratectomy; LASIK laser assisted in situ keratomileusis; HTN hypertension; DM diabetes mellitus; COPD chronic obstructive pulmonary disease

## 2021-04-18 ENCOUNTER — Emergency Department (HOSPITAL_COMMUNITY)
Admission: EM | Admit: 2021-04-18 | Discharge: 2021-04-19 | Disposition: A | Discharge status: Home / Self Care | Payer: Medicare Other | Attending: Emergency Medicine | Admitting: Emergency Medicine

## 2021-04-18 ENCOUNTER — Other Ambulatory Visit: Payer: Self-pay

## 2021-04-18 DIAGNOSIS — I483 Typical atrial flutter: Secondary | ICD-10-CM | POA: Diagnosis not present

## 2021-04-18 DIAGNOSIS — I959 Hypotension, unspecified: Secondary | ICD-10-CM | POA: Diagnosis not present

## 2021-04-18 DIAGNOSIS — Z79899 Other long term (current) drug therapy: Secondary | ICD-10-CM | POA: Diagnosis not present

## 2021-04-18 DIAGNOSIS — N186 End stage renal disease: Secondary | ICD-10-CM | POA: Insufficient documentation

## 2021-04-18 DIAGNOSIS — E1122 Type 2 diabetes mellitus with diabetic chronic kidney disease: Secondary | ICD-10-CM | POA: Insufficient documentation

## 2021-04-18 DIAGNOSIS — Z7901 Long term (current) use of anticoagulants: Secondary | ICD-10-CM | POA: Insufficient documentation

## 2021-04-18 DIAGNOSIS — I120 Hypertensive chronic kidney disease with stage 5 chronic kidney disease or end stage renal disease: Secondary | ICD-10-CM | POA: Diagnosis not present

## 2021-04-18 DIAGNOSIS — Z8546 Personal history of malignant neoplasm of prostate: Secondary | ICD-10-CM | POA: Diagnosis not present

## 2021-04-18 DIAGNOSIS — Z992 Dependence on renal dialysis: Secondary | ICD-10-CM | POA: Diagnosis not present

## 2021-04-18 DIAGNOSIS — Z87891 Personal history of nicotine dependence: Secondary | ICD-10-CM | POA: Diagnosis not present

## 2021-04-18 DIAGNOSIS — H9313 Tinnitus, bilateral: Secondary | ICD-10-CM | POA: Diagnosis not present

## 2021-04-18 DIAGNOSIS — R42 Dizziness and giddiness: Secondary | ICD-10-CM | POA: Diagnosis present

## 2021-04-19 ENCOUNTER — Encounter (HOSPITAL_COMMUNITY): Payer: Self-pay

## 2021-04-19 ENCOUNTER — Emergency Department (HOSPITAL_COMMUNITY): Payer: Medicare Other

## 2021-04-19 ENCOUNTER — Other Ambulatory Visit: Payer: Self-pay

## 2021-04-19 LAB — HEPATIC FUNCTION PANEL
ALT: 18 U/L (ref 0–44)
AST: 24 U/L (ref 15–41)
Albumin: 3.9 g/dL (ref 3.5–5.0)
Alkaline Phosphatase: 202 U/L — ABNORMAL HIGH (ref 38–126)
Bilirubin, Direct: 0.1 mg/dL (ref 0.0–0.2)
Indirect Bilirubin: 0.6 mg/dL (ref 0.3–0.9)
Total Bilirubin: 0.7 mg/dL (ref 0.3–1.2)
Total Protein: 8.4 g/dL — ABNORMAL HIGH (ref 6.5–8.1)

## 2021-04-19 LAB — BASIC METABOLIC PANEL
Anion gap: 14 (ref 5–15)
BUN: 32 mg/dL — ABNORMAL HIGH (ref 8–23)
CO2: 32 mmol/L (ref 22–32)
Calcium: 8.9 mg/dL (ref 8.9–10.3)
Chloride: 92 mmol/L — ABNORMAL LOW (ref 98–111)
Creatinine, Ser: 7.31 mg/dL — ABNORMAL HIGH (ref 0.61–1.24)
GFR, Estimated: 7 mL/min — ABNORMAL LOW (ref >60)
Glucose, Bld: 154 mg/dL — ABNORMAL HIGH (ref 70–99)
Potassium: 4.4 mmol/L (ref 3.5–5.1)
Sodium: 138 mmol/L (ref 135–145)

## 2021-04-19 LAB — CBC WITH DIFFERENTIAL/PLATELET
Abs Immature Granulocytes: 0.03 10*3/uL (ref 0.00–0.07)
Basophils Absolute: 0.1 10*3/uL (ref 0.0–0.1)
Basophils Relative: 1 %
Eosinophils Absolute: 0.1 10*3/uL (ref 0.0–0.5)
Eosinophils Relative: 1 %
HCT: 39.4 % (ref 39.0–52.0)
Hemoglobin: 12.3 g/dL — ABNORMAL LOW (ref 13.0–17.0)
Immature Granulocytes: 0 %
Lymphocytes Relative: 22 %
Lymphs Abs: 2.3 10*3/uL (ref 0.7–4.0)
MCH: 31.8 pg (ref 26.0–34.0)
MCHC: 31.2 g/dL (ref 30.0–36.0)
MCV: 101.8 fL — ABNORMAL HIGH (ref 80.0–100.0)
Monocytes Absolute: 1 10*3/uL (ref 0.1–1.0)
Monocytes Relative: 10 %
Neutro Abs: 6.7 10*3/uL (ref 1.7–7.7)
Neutrophils Relative %: 66 %
Platelets: 178 10*3/uL (ref 150–400)
RBC: 3.87 MIL/uL — ABNORMAL LOW (ref 4.22–5.81)
RDW: 15.8 % — ABNORMAL HIGH (ref 11.5–15.5)
WBC: 10.1 10*3/uL (ref 4.0–10.5)
nRBC: 0 % (ref 0.0–0.2)

## 2021-04-19 LAB — TROPONIN I (HIGH SENSITIVITY): Troponin I (High Sensitivity): 13 ng/L (ref <18)

## 2021-04-19 LAB — LACTIC ACID, PLASMA: Lactic Acid, Venous: 2.7 mmol/L (ref 0.5–1.9)

## 2021-04-19 MED ORDER — SODIUM CHLORIDE 0.9 % IV BOLUS
1000.0000 mL | Freq: Once | INTRAVENOUS | Status: AC
  Administered 2021-04-19: 1000 mL via INTRAVENOUS

## 2021-04-19 NOTE — ED Notes (Signed)
Patient transported to CT 

## 2021-04-19 NOTE — ED Provider Notes (Signed)
[facility] EMERGENCY DEPARTMENT Provider Note   CSN: [id number] Arrival date & time: 04/18/21  2259     History Chief Complaint  Patient presents with   Tinnitus    Justin [name] is a 75 y.o. male.  Patient presents to the emergency department with decreased hearing, ringing and roaring in both of his ears, dizziness that started around 1 PM today.  Patient denies any associated headache.      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 ([place]) 12/07/2019   H/O vitrectomy 12/07/2019   Macular pucker, 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   Follow-up examination after eye surgery 10/24/2019   Proliferative diabetic retinopathy of right eye ([place]) 10/24/2019   Proliferative diabetic retinopathy of left eye ([place]) 10/24/2019   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   Anemia in chronic kidney disease 11/20/2015   Blurred vision, left eye 07/10/2011   HTN (hypertension) 07/10/2011   DM (diabetes mellitus) ([place]) 07/10/2011   CKD (chronic kidney disease) 07/10/2011    Past Surgical History:  Procedure Laterality Date   A/V FISTULAGRAM Left 08/17/2017   Procedure: A/V FISTULAGRAM;  Surgeon: Katha Cabal, MD;  Location: [facility];  Service: Cardiovascular;  Laterality: Left;   A/V SHUNT INTERVENTION N/A 08/17/2017   Procedure: A/V SHUNT INTERVENTION;  Surgeon: Katha Cabal, MD;  Location: [facility];  Service: Cardiovascular;   Laterality: N/A;   AV FISTULA PLACEMENT Left 05/31/2015   Procedure: ARTERIOVENOUS (AV) FISTULA CREATION;  Surgeon: Katha Cabal, MD;  Location: [facility];  Service: Vascular;  Laterality: Left;   excision bone spurs Bilateral 1989   feet   KNEE SURGERY Left 1998   arthroscopy   SHOULDER SURGERY Left 1994   rotator cuff       Family History  Problem Relation Age of Onset   Kidney disease Mother    Alcoholism Father    Alcoholism Brother     Social History   Tobacco Use   Smoking status: Former    Types: Cigarettes    Quit date: 10/01/1978    Years since quitting: 42.5   Smokeless tobacco: Never  Substance Use Topics   Alcohol use: No   Drug use: No    Home Medications Prior to Admission medications   Medication Sig Start Date End Date Taking? Authorizing Provider  allopurinol (ZYLOPRIM) 300 MG tablet Take 300 mg by mouth daily. 10/13/19   [provider]  atorvastatin (LIPITOR) 40 MG tablet Take 40 mg by mouth daily.  10/30/14   [provider]  doxycycline (VIBRAMYCIN) 100 MG capsule Take 1 capsule (100 mg total) by mouth 2 (two) times daily. 02/01/20   Johnson, Clanford L, MD  ELIQUIS 5 MG TABS tablet Take 5 mg by mouth 2 (two) times daily. 10/07/19   [provider]  metoprolol tartrate (LOPRESSOR) 25 MG tablet Take 1  tablet (25 mg total) by mouth 2 (two) times daily. 02/01/20 03/02/20  Johnson, Clanford L, MD  omeprazole (PRILOSEC) 20 MG capsule Take 20 mg by mouth every morning.     [provider]    Allergies    Patient has no known allergies.  Review of Systems   Review of Systems  HENT:  Positive for tinnitus.   Neurological:  Positive for dizziness.  All other systems reviewed and are negative.  Physical Exam Updated Vital Signs BP (!) 118/99   Pulse (!) 105   Temp 98 F (36.7 C)   Resp (!) 23   Ht 5\' 10"  (1.778 m)   Wt 110 kg   SpO2 98%   BMI 34.80 kg/m   Physical Exam Vitals and nursing note reviewed.   Constitutional:      General: He is not in acute distress.    Appearance: Normal appearance. He is well-developed.  HENT:     Head: Normocephalic and atraumatic.     Right Ear: Hearing normal.     Left Ear: Hearing normal.     Nose: Nose normal.  Eyes:     Conjunctiva/sclera: Conjunctivae normal.     Pupils: Pupils are equal, round, and reactive to light.  Cardiovascular:     Rate and Rhythm: Regular rhythm.     Heart sounds: S1 normal and S2 normal. No murmur heard.   No friction rub. No gallop.  Pulmonary:     Effort: Pulmonary effort is normal. No respiratory distress.     Breath sounds: Normal breath sounds.  Chest:     Chest wall: No tenderness.  Abdominal:     General: Bowel sounds are normal.     Palpations: Abdomen is soft.     Tenderness: There is no abdominal tenderness. There is no guarding or rebound. Negative signs include Murphy's sign and McBurney's sign.     Hernia: No hernia is present.  Musculoskeletal:        General: Normal range of motion.     Cervical back: Normal range of motion and neck supple.  Skin:    General: Skin is warm and dry.     Findings: No rash.  Neurological:     Mental Status: He is alert and oriented to person, place, and time.     GCS: GCS eye subscore is 4. GCS verbal subscore is 5. GCS motor subscore is 6.     Cranial Nerves: No cranial nerve deficit.     Sensory: No sensory deficit.     Coordination: Coordination normal.  Psychiatric:        Speech: Speech normal.        Behavior: Behavior normal.        Thought Content: Thought content normal.    ED Results / Procedures / Treatments   Labs (all labs ordered are listed, but only abnormal results are displayed) Labs Reviewed  CBC WITH DIFFERENTIAL/PLATELET - Abnormal; Notable for the following components:      Result Value   RBC 3.87 (*)    Hemoglobin 12.3 (*)    MCV 101.8 (*)    RDW 15.8 (*)    All other components within normal limits  BASIC METABOLIC PANEL -  Abnormal; Notable for the following components:   Chloride 92 (*)    Glucose, Bld 154 (*)    BUN 32 (*)    Creatinine, Ser 7.31 (*)    GFR, Estimated 7 (*)    All other components within normal  limits  HEPATIC FUNCTION PANEL - Abnormal; Notable for the following components:   Total Protein 8.4 (*)    Alkaline Phosphatase 202 (*)    All other components within normal limits  LACTIC ACID, PLASMA - Abnormal; Notable for the following components:   Lactic Acid, Venous 2.7 (*)    All other components within normal limits  TROPONIN I (HIGH SENSITIVITY)  TROPONIN I (HIGH SENSITIVITY)    EKG EKG Interpretation  Date/Time:  Saturday April 19 2021 00:17:19 EDT Ventricular Rate:  133 PR Interval:  243 QRS Duration: 116 QT Interval:  324 QTC Calculation: 482 R Axis:   14 Text Interpretation: Sinus tachycardia Prolonged PR interval Nonspecific intraventricular conduction delay ST depression, probably rate related Confirmed by Orpah Greek [phone]) on 04/19/2021 1:30:37 AM  Radiology CT HEAD WO CONTRAST (5MM)  Result Date: 04/19/2021 CLINICAL DATA:  Nonspecific dizziness. Ringing in the ears for 1 day. No known injury. EXAM: CT HEAD WITHOUT CONTRAST TECHNIQUE: Contiguous axial images were obtained from the base of the skull through the vertex without intravenous contrast. COMPARISON:  CT head 07/10/2011.  MRI brain 07/10/2011 FINDINGS: Brain: No evidence of acute infarction, hemorrhage, hydrocephalus, extra-axial collection or mass lesion/mass effect. Diffuse cerebral atrophy. Low-attenuation changes throughout the deep white matter consistent with small vessel ischemia. White matter changes have progressed since the previous studies. Vascular: Moderate intracranial arterial vascular calcifications. Skull: Calvarium appears intact. Sinuses/Orbits: Paranasal sinuses and mastoid air cells are clear. Other: None. IMPRESSION: No acute intracranial abnormalities. Chronic atrophy and small  vessel ischemic changes. Electronically Signed   By: Lucienne Capers M.D.   On: 04/19/2021 00:35    Procedures Procedures   Medications Ordered in ED Medications  sodium chloride 0.9 % bolus 1,000 mL (0 mLs Intravenous Stopped 04/19/21 0150)    ED Course  I have reviewed the triage vital signs and the nursing notes.  Pertinent labs & imaging results that were available during my care of the patient were reviewed by me and considered in my medical decision making (see chart for details).    MDM Rules/Calculators/A&P     CHA2DS2-VASc Score: 4                     Patient presented to the emergency department for evaluation of dizziness, ringing in his ears and decreased hearing.  Symptoms began earlier today.  Patient with normal neurologic exam on arrival, no focal deficits.  He is not experiencing any headache.  Patient noted to be hypotensive and tachycardic at arrival.  Hypotension and tachycardia resolved with IV fluids.  Patient appeared to be in sinus tachycardia at arrival, but has had periods of atrial fibrillation and atrial flutter.  He does have a history of paroxysmal atrial flutter.  He is currently on Eliquis.  Reviewing his records he had a hospitalization in 2021 were the atrial fibrillation was first discovered.  He converted after he was on a Cardizem drip for 1 day.  He is not chronically in A. fib.  Because of this I recommended hospitalization.  Patient has declined.  He reports that he cares for his wife who has dementia and he cannot leave her alone.  I discussed with him the severity of his findings and the need for hospitalization, but he reports there would be nothing I could do to convince him to be admitted to the hospital at this time.  Rate is currently controlled in the 90s.  As he is anticoagulated, it is also reasonable  for him to follow-up with cardiology.  Patient given return precautions.  CHA2DS2-VASc Score = 4  Final Clinical Impression(s) / ED  Diagnoses Final diagnoses:  Typical atrial flutter [facility])    Rx / DC Orders ED Discharge Orders     None        Maylon Sailors, Gwenyth Allegra, MD 04/19/21 [phone]

## 2021-04-19 NOTE — ED Triage Notes (Signed)
Pt states that his ears started ringing today 1pm. Complains of not being able to ear very well. Pt feels dizzy and lightheaded.

## 2021-05-05 DIAGNOSIS — R1900 Intra-abdominal and pelvic swelling, mass and lump, unspecified site: Secondary | ICD-10-CM | POA: Insufficient documentation

## 2021-05-29 ENCOUNTER — Encounter (INDEPENDENT_AMBULATORY_CARE_PROVIDER_SITE_OTHER): Payer: Medicare Other | Admitting: Ophthalmology

## 2021-06-17 ENCOUNTER — Ambulatory Visit (INDEPENDENT_AMBULATORY_CARE_PROVIDER_SITE_OTHER): Payer: Medicare Other | Admitting: Ophthalmology

## 2021-06-17 ENCOUNTER — Other Ambulatory Visit: Payer: Self-pay

## 2021-06-17 ENCOUNTER — Encounter (INDEPENDENT_AMBULATORY_CARE_PROVIDER_SITE_OTHER): Payer: Self-pay | Admitting: Ophthalmology

## 2021-06-17 DIAGNOSIS — E113552 Type 2 diabetes mellitus with stable proliferative diabetic retinopathy, left eye: Secondary | ICD-10-CM | POA: Diagnosis not present

## 2021-06-17 DIAGNOSIS — E113511 Type 2 diabetes mellitus with proliferative diabetic retinopathy with macular edema, right eye: Secondary | ICD-10-CM | POA: Diagnosis not present

## 2021-06-17 DIAGNOSIS — E113551 Type 2 diabetes mellitus with stable proliferative diabetic retinopathy, right eye: Secondary | ICD-10-CM

## 2021-06-17 DIAGNOSIS — H35371 Puckering of macula, right eye: Secondary | ICD-10-CM | POA: Diagnosis not present

## 2021-06-17 DIAGNOSIS — H26491 Other secondary cataract, right eye: Secondary | ICD-10-CM | POA: Diagnosis not present

## 2021-06-17 NOTE — Assessment & Plan Note (Signed)

## 2021-06-17 NOTE — Progress Notes (Signed)
06/17/2021     CHIEF COMPLAINT Patient presents for  Chief Complaint  Patient presents with   Retina Evaluation      HISTORY OF PRESENT ILLNESS: Justin [name] is a 75 y.o. male who presents to the clinic today for:   HPI     Retina Evaluation   In both eyes.        Comments   History of type 2 diabetes mellitus with history of diabetic retinopathy proliferative diabetic retinopathy with macular edema in the past also status post vitrectomy membrane peel right eye for macular pucker, with improved acuity from hand motions now 20/50, no interval change in vision over the last 3 months      Last edited by Hurman Horn, MD on 06/17/2021  8:11 AM.      Referring physician: Kendrick Ranch, [place] [address]  HISTORICAL INFORMATION:   Selected notes from the MEDICAL RECORD NUMBER    Lab Results  Component Value Date   HGBA1C 6.1 (H) 01/28/2020     CURRENT MEDICATIONS: No current outpatient medications on file. (Ophthalmic Drugs)   No current facility-administered medications for this visit. (Ophthalmic Drugs)   Current Outpatient Medications (Other)  Medication Sig   allopurinol (ZYLOPRIM) 300 MG tablet Take 300 mg by mouth daily.   atorvastatin (LIPITOR) 40 MG tablet Take 40 mg by mouth daily.    doxycycline (VIBRAMYCIN) 100 MG capsule Take 1 capsule (100 mg total) by mouth 2 (two) times daily.   ELIQUIS 5 MG TABS tablet Take 5 mg by mouth 2 (two) times daily.   metoprolol tartrate (LOPRESSOR) 25 MG tablet Take 1 tablet (25 mg total) by mouth 2 (two) times daily.   omeprazole (PRILOSEC) 20 MG capsule Take 20 mg by mouth every morning.    No current facility-administered medications for this visit. 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;  radiation seeds   Chronic kidney disease    stage 4   Diabetes mellitus    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 ([place]) 12/07/2019   Gout    Hypertension    Right posterior capsular opacification 02/27/2021   Vitreous hemorrhage of right eye ([place]) 10/24/2019   Past Surgical History:  Procedure Laterality Date   A/V FISTULAGRAM Left 08/17/2017   Procedure: A/V FISTULAGRAM;  Surgeon: Katha Cabal, MD;  Location: [facility];  Service: Cardiovascular;  Laterality: Left;   A/V SHUNT INTERVENTION N/A 08/17/2017   Procedure: A/V SHUNT INTERVENTION;  Surgeon: Katha Cabal, MD;  Location: [facility];  Service: Cardiovascular;  Laterality: N/A;   AV FISTULA PLACEMENT Left 05/31/2015   Procedure: ARTERIOVENOUS (AV) FISTULA CREATION;  Surgeon: Katha Cabal, MD;  Location: [facility];  Service: Vascular;  Laterality: Left;   excision bone spurs Bilateral 1989   feet   KNEE SURGERY Left 1998   arthroscopy   SHOULDER SURGERY Left 1994   rotator cuff    FAMILY HISTORY Family History  Problem Relation Age of Onset   Kidney disease Mother    Alcoholism Father    Alcoholism Brother     SOCIAL HISTORY Social History   Tobacco Use   Smoking  status: Former    Types: Cigarettes    Quit date: 10/01/1978    Years since quitting: 42.7   Smokeless tobacco: Never  Substance Use Topics   Alcohol use: No   Drug use: No         OPHTHALMIC EXAM:  Base Eye Exam     Visual Acuity (ETDRS)       Right Left   Dist [place] 20/50 -2 20/25 +2         Tonometry (Tonopen, 8:10 AM)       Right Left   Pressure 10 14         Pupils       Pupils APD   Right PERRL None   Left PERRL None         Visual Fields       Left Right    Full Full         Neuro/Psych     Oriented x3: Yes   Mood/Affect: Normal         Dilation     Both eyes: 1.0% Mydriacyl, 2.5% Phenylephrine @ 8:11 AM            Slit Lamp and Fundus Exam     External Exam       Right Left   External Normal Normal         Slit Lamp Exam       Right Left   Lids/Lashes Normal Normal   Conjunctiva/Sclera White and quiet White and quiet   Cornea Clear Clear   Anterior Chamber Deep and quiet Deep and quiet   Iris Round and reactive Round and reactive   Lens Open posterior capsule, Centered posterior chamber intraocular lens Centered posterior chamber intraocular lens   Anterior Vitreous Normal Normal         Fundus Exam       Right Left   Posterior Vitreous Vitrectomized, clear,  Posterior vitreous detachment clear   Disc Normal Normal   C/D Ratio 0.35 0.4   Macula No topographic distortion, Focal laser scars no macular thickening, Focal laser scars   Vessels PDR-quiet Quiescent proliferative diabetic retinopathy   Periphery Good PRP peripherally Good panretinal photocoagulation, 8            IMAGING AND PROCEDURES  Imaging and Procedures for 06/17/21  OCT, Retina - OU - Both Eyes       Right Eye Quality was good. Scan locations included subfoveal. Central Foveal Thickness: 331. Progression has improved. Findings include abnormal foveal contour.   Left Eye Quality was good. Scan locations included subfoveal. Central Foveal Thickness: 253. Progression has been stable. Findings include abnormal foveal contour.   Notes OD, now 1.5 years status post vitrectomy, membrane peel for severe epiretinal membrane with counting years vision.  Visual acuity is much improved out of 20/60 and vastly improved macular thickening as the retinal thickening resolves post vitrectomy             ASSESSMENT/PLAN:  Right posterior capsular opacification 94-month status post YAG capsulotomy right eye, visual axis clear  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 determined by examination associated with type 2 diabetes mellitus ([place]) The nature of regressed  proliferative diabetic retinopathy was discussed with the patient. The patient was advised to maintain good glucose, blood pressure, monitor kidney function and serum lipid control as advised by personal physician. Rare risk for reactivation of progression exist with untreated severe anemia, untreated renal failure, untreated heart failure, and  smoking. Complete avoidance of smoking was recommended. The chance of recurrent proliferative diabetic retinopathy was discussed as well as the chance of vitreous hemorrhage for which further treatments may be necessary.   Explained to the patient that the quiescent  proliferative diabetic retinopathy disease is unlikely to ever worsen.  Worsening factors would include however severe anemia, hypertension out-of-control or impending renal failure.  Stable treated proliferative diabetic retinopathy of left eye determined by examination associated with type 2 diabetes mellitus ([place]) The nature of regressed proliferative diabetic retinopathy was discussed with the patient. The patient was advised to maintain good glucose, blood pressure, monitor kidney function and serum lipid control as advised by personal physician. Rare risk for reactivation of progression exist with untreated severe anemia, untreated renal failure, untreated heart failure, and smoking. Complete avoidance of smoking was recommended. The chance of recurrent proliferative diabetic retinopathy was discussed as well as the chance of vitreous hemorrhage for which further treatments may be necessary.   Explained to the patient that the quiescent  proliferative diabetic retinopathy disease is unlikely to ever worsen.  Worsening factors would include however severe anemia, hypertension out-of-control or impending renal failure.      ICD-10-CM   1. 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)  E11.3511 OCT, Retina - OU - Both Eyes    2.  Macular pucker, right eye  H35.371 OCT, Retina - OU - Both Eyes    3. Stable proliferative diabetic retinopathy of left eye associated with type 2 diabetes mellitus (HCC)  B51.0258 OCT, Retina - OU - Both Eyes    4. Right posterior capsular opacification  H26.491     5. 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 determined by examination associated with type 2 diabetes mellitus ([place])  E11.3551     6. Stable treated proliferative diabetic retinopathy of left eye determined by examination associated with type 2 diabetes mellitus ([place])  N27.7824       1.  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  OD vitrectomized after repair of severe epiretinal membrane topographic distortion summer 2021.  Improved acuity from count fingers now 20/50 OD no active CSME, OU  2.  3.  Ophthalmic Meds Ordered this visit:  No orders of the defined types were placed in this encounter.      Return in about 9 months (around 03/18/2022) for DILATE OU, COLOR FP, OCT.  There are no Patient Instructions on file for this visit.   Explained the diagnoses, plan, and follow up with the patient and they expressed understanding.  Patient expressed understanding of the importance of proper follow up care.   Clent Demark Kenan Moodie M.D. Diseases & Surgery of the Retina and Vitreous Retina & Diabetic [place] 06/17/21     Abbreviations: M myopia (nearsighted); A astigmatism; H hyperopia (farsighted); P presbyopia; Mrx spectacle prescription;  CTL contact lenses; OD right eye; OS left eye; OU both eyes  XT exotropia; ET esotropia; PEK punctate epithelial keratitis; PEE punctate epithelial erosions; DES dry eye syndrome; MGD meibomian gland dysfunction; ATs artificial tears; PFAT's preservative free artificial tears; [place] nuclear sclerotic cataract; PSC posterior subcapsular cataract; ERM epi-retinal membrane; PVD posterior vitreous detachment; RD retinal detachment; DM diabetes  mellitus; DR diabetic retinopathy; NPDR non-proliferative diabetic retinopathy; PDR proliferative diabetic retinopathy; CSME clinically significant macular edema; DME diabetic macular edema; dbh dot blot hemorrhages; CWS cotton wool spot; POAG primary open angle glaucoma; C/D cup-to-disc ratio; HVF  humphrey visual field; GVF goldmann visual field; OCT optical coherence tomography; IOP intraocular pressure; BRVO Branch retinal vein occlusion; CRVO central retinal vein occlusion; CRAO central retinal artery occlusion; BRAO branch retinal artery occlusion; RT retinal tear; SB scleral buckle; PPV pars plana vitrectomy; VH Vitreous hemorrhage; PRP panretinal laser photocoagulation; IVK intravitreal kenalog; VMT vitreomacular traction; MH Macular hole;  NVD neovascularization of the disc; NVE neovascularization elsewhere; AREDS age related eye disease study; ARMD age related macular degeneration; POAG primary open angle glaucoma; EBMD epithelial/anterior basement membrane dystrophy; ACIOL anterior chamber intraocular lens; IOL intraocular lens; PCIOL posterior chamber intraocular lens; Phaco/IOL phacoemulsification with intraocular lens placement; [place] photorefractive keratectomy; LASIK laser assisted in situ keratomileusis; HTN hypertension; DM diabetes mellitus; COPD chronic obstructive pulmonary disease

## 2021-06-17 NOTE — Assessment & Plan Note (Signed)
69-month status post YAG capsulotomy right eye, visual axis clear

## 2021-10-31 ENCOUNTER — Other Ambulatory Visit: Payer: Self-pay | Admitting: Nurse Practitioner

## 2021-11-06 DIAGNOSIS — I350 Nonrheumatic aortic (valve) stenosis: Secondary | ICD-10-CM | POA: Insufficient documentation

## 2021-11-06 DIAGNOSIS — E782 Mixed hyperlipidemia: Secondary | ICD-10-CM | POA: Insufficient documentation

## 2021-11-11 DIAGNOSIS — R600 Localized edema: Secondary | ICD-10-CM | POA: Insufficient documentation

## 2021-11-11 DIAGNOSIS — I493 Ventricular premature depolarization: Secondary | ICD-10-CM | POA: Insufficient documentation

## 2021-11-11 DIAGNOSIS — Z79899 Other long term (current) drug therapy: Secondary | ICD-10-CM | POA: Insufficient documentation

## 2021-11-11 DIAGNOSIS — K219 Gastro-esophageal reflux disease without esophagitis: Secondary | ICD-10-CM | POA: Insufficient documentation

## 2021-11-11 DIAGNOSIS — I480 Paroxysmal atrial fibrillation: Secondary | ICD-10-CM | POA: Insufficient documentation

## 2021-11-11 DIAGNOSIS — Z9289 Personal history of other medical treatment: Secondary | ICD-10-CM | POA: Insufficient documentation

## 2021-11-14 HISTORY — DX: Hypercalcemia: E83.52

## 2022-01-27 DIAGNOSIS — G8929 Other chronic pain: Secondary | ICD-10-CM | POA: Insufficient documentation

## 2022-01-27 DIAGNOSIS — G988 Other disorders of nervous system: Secondary | ICD-10-CM | POA: Insufficient documentation

## 2022-01-27 DIAGNOSIS — R011 Cardiac murmur, unspecified: Secondary | ICD-10-CM | POA: Insufficient documentation

## 2022-03-03 DIAGNOSIS — M109 Gout, unspecified: Secondary | ICD-10-CM | POA: Insufficient documentation

## 2022-03-19 ENCOUNTER — Encounter (INDEPENDENT_AMBULATORY_CARE_PROVIDER_SITE_OTHER): Payer: Medicare Other | Admitting: Ophthalmology

## 2022-04-02 ENCOUNTER — Encounter (INDEPENDENT_AMBULATORY_CARE_PROVIDER_SITE_OTHER): Payer: Self-pay | Admitting: Ophthalmology

## 2022-04-02 ENCOUNTER — Ambulatory Visit (INDEPENDENT_AMBULATORY_CARE_PROVIDER_SITE_OTHER): Payer: Medicare Other | Admitting: Ophthalmology

## 2022-04-02 DIAGNOSIS — H35371 Puckering of macula, right eye: Secondary | ICD-10-CM | POA: Diagnosis not present

## 2022-04-02 DIAGNOSIS — E113551 Type 2 diabetes mellitus with stable proliferative diabetic retinopathy, right eye: Secondary | ICD-10-CM | POA: Diagnosis not present

## 2022-04-02 DIAGNOSIS — E113552 Type 2 diabetes mellitus with stable proliferative diabetic retinopathy, left eye: Secondary | ICD-10-CM | POA: Diagnosis not present

## 2022-04-02 DIAGNOSIS — E113511 Type 2 diabetes mellitus with proliferative diabetic retinopathy with macular edema, right eye: Secondary | ICD-10-CM

## 2022-04-02 NOTE — Assessment & Plan Note (Signed)

## 2022-04-02 NOTE — Progress Notes (Signed)
04/02/2022     CHIEF COMPLAINT Patient presents for  Chief Complaint  Patient presents with   Diabetic Retinopathy with Macular Edema      HISTORY OF PRESENT ILLNESS: Justin [name] is a 76 y.o. male who presents to the clinic today for:   HPI   9 MOS FU OU OCT FP. Pt stated vision has been stable.  Last edited by Silvestre Moment on 04/02/2022  8:10 AM.      Referring physician: Kendrick Ranch, [place] [address]  HISTORICAL INFORMATION:   Selected notes from the MEDICAL RECORD NUMBER    Lab Results  Component Value Date   HGBA1C 6.1 (H) 01/28/2020     CURRENT MEDICATIONS: No current outpatient medications on file. (Ophthalmic Drugs)   No current facility-administered medications for this visit. (Ophthalmic Drugs)   Current Outpatient Medications (Other)  Medication Sig   allopurinol (ZYLOPRIM) 300 MG tablet Take 300 mg by mouth daily.   atorvastatin (LIPITOR) 40 MG tablet Take 40 mg by mouth daily.    doxycycline (VIBRAMYCIN) 100 MG capsule Take 1 capsule (100 mg total) by mouth 2 (two) times daily.   ELIQUIS 5 MG TABS tablet Take 5 mg by mouth 2 (two) times daily.   metoprolol tartrate (LOPRESSOR) 25 MG tablet Take 1 tablet (25 mg total) by mouth 2 (two) times daily.   omeprazole (PRILOSEC) 20 MG capsule Take 20 mg by mouth every morning.    No current facility-administered medications for this visit. (Other)      REVIEW OF SYSTEMS: ROS   Negative for: Constitutional, Gastrointestinal, Neurological, Skin, Genitourinary, Musculoskeletal, HENT, Endocrine, Cardiovascular, Eyes, Respiratory, Psychiatric, Allergic/Imm, Heme/Lymph Last edited by Silvestre Moment on 04/02/2022  8:09 AM.       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; radiation seeds   Chronic kidney disease    stage 4   Diabetes  mellitus    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 ([place]) 12/07/2019   Gout    Hypertension    Right posterior capsular opacification 02/27/2021   Vitreous hemorrhage of right eye ([place]) 10/24/2019   Past Surgical History:  Procedure Laterality Date   A/V FISTULAGRAM Left 08/17/2017   Procedure: A/V FISTULAGRAM;  Surgeon: Katha Cabal, MD;  Location: [facility];  Service: Cardiovascular;  Laterality: Left;   A/V SHUNT INTERVENTION N/A 08/17/2017   Procedure: A/V SHUNT INTERVENTION;  Surgeon: Katha Cabal, MD;  Location: [facility];  Service: Cardiovascular;  Laterality: N/A;   AV FISTULA PLACEMENT Left 05/31/2015   Procedure: ARTERIOVENOUS (AV) FISTULA CREATION;  Surgeon: Katha Cabal, MD;  Location: [facility];  Service: Vascular;  Laterality: Left;   excision bone spurs Bilateral 1989   feet   KNEE SURGERY Left 1998   arthroscopy   SHOULDER SURGERY Left 1994   rotator cuff    FAMILY HISTORY Family History  Problem Relation Age of Onset   Kidney disease Mother    Alcoholism Father    Alcoholism Brother     SOCIAL HISTORY Social History   Tobacco Use   Smoking status: Former    Types: Cigarettes    Quit date: 10/01/1978    Years since quitting: 43.5   Smokeless tobacco: Never  Substance  Use Topics   Alcohol use: No   Drug use: No         OPHTHALMIC EXAM:  Base Eye Exam     Visual Acuity (ETDRS)       Right Left   Dist [place] 20/70 -2 20/40 -2   Dist ph [place] 20/60 20/30 -2         Tonometry (Tonopen, 8:15 AM)       Right Left   Pressure 13 15         Pupils       Pupils APD   Right PERRL None   Left PERRL None         Visual Fields       Left Right    Full Full         Extraocular Movement       Right Left    Full, Ortho Full, Ortho         Neuro/Psych     Oriented x3: Yes   Mood/Affect: Normal         Dilation     Both eyes:  1.0% Mydriacyl, 2.5% Phenylephrine @ 8:15 AM           Slit Lamp and Fundus Exam     External Exam       Right Left   External Normal Normal         Slit Lamp Exam       Right Left   Lids/Lashes Normal Normal   Conjunctiva/Sclera White and quiet White and quiet   Cornea Clear Clear   Anterior Chamber Deep and quiet Deep and quiet   Iris Round and reactive Round and reactive   Lens Open posterior capsule, Centered posterior chamber intraocular lens Centered posterior chamber intraocular lens   Anterior Vitreous Normal Normal         Fundus Exam       Right Left   Posterior Vitreous Vitrectomized, clear,  Posterior vitreous detachment clear   Disc Normal Normal   C/D Ratio 0.35 0.4   Macula No topographic distortion, Focal laser scars no macular thickening, Focal laser scars   Vessels PDR-quiet Quiescent proliferative diabetic retinopathy   Periphery Good PRP peripherally Good panretinal photocoagulation, 8            IMAGING AND PROCEDURES  Imaging and Procedures for 04/02/22  OCT, Retina - OU - Both Eyes       Right Eye Quality was good. Scan locations included subfoveal. Central Foveal Thickness: 317. Progression has improved. Findings include abnormal foveal contour.   Left Eye Quality was good. Scan locations included subfoveal. Central Foveal Thickness: 245. Progression has been stable. Findings include abnormal foveal contour.   Notes OD, now 2.5 years status post vitrectomy, membrane peel for severe epiretinal membrane preop counting fingers vision at that time.  Now improved to 20/60  Visual acuity is much improved out of 20/60 and vastly improved macular thickening as the retinal thickening resolves post vitrectomy      Color Fundus Photography Optos - OU - Both Eyes       Right Eye Progression has worsened. Disc findings include normal observations. Macula : microaneurysms.   Left Eye Progression has worsened. Macula : microaneurysms.    Notes OD with CSME.   Minor topographic distortion from epiretinal membrane present OD.  Good panretinal photocoagulation 360 degrees.  PDR quiet   OS similar findings with minor CME  ASSESSMENT/PLAN:  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 determined by examination associated with type 2 diabetes mellitus ([place]) The nature of regressed proliferative diabetic retinopathy was discussed with the patient. The patient was advised to maintain good glucose, blood pressure, monitor kidney function and serum lipid control as advised by personal physician. Rare risk for reactivation of progression exist with untreated severe anemia, untreated renal failure, untreated heart failure, and smoking. Complete avoidance of smoking was recommended. The chance of recurrent proliferative diabetic retinopathy was discussed as well as the chance of vitreous hemorrhage for which further treatments may be necessary.   Explained to the patient that the quiescent  proliferative diabetic retinopathy disease is unlikely to ever worsen.  Worsening factors would include however severe anemia, hypertension out-of-control or impending renal failure.  Stable treated proliferative diabetic retinopathy of left eye determined by examination associated with type 2 diabetes mellitus ([place]) The nature of regressed proliferative diabetic retinopathy was discussed with the patient. The patient was advised to maintain good glucose, blood pressure, monitor kidney function and serum lipid control as advised by personal physician. Rare risk for reactivation of progression exist with untreated severe anemia, untreated renal failure, untreated heart failure, and smoking. Complete avoidance of smoking was recommended. The chance of recurrent proliferative diabetic retinopathy was discussed as well as the chance of vitreous hemorrhage for which further treatments may be necessary.   Explained to the  patient that the quiescent  proliferative diabetic retinopathy disease is unlikely to ever worsen.  Worsening factors would include however severe anemia, hypertension out-of-control or impending renal failure.  Macular pucker, right eye Condition resolved from surgery June 2021     ICD-10-CM   1. 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 determined by examination associated with type 2 diabetes mellitus ([place])  H06.2376     3. Stable treated proliferative diabetic retinopathy of left eye determined by examination associated with type 2 diabetes mellitus ([place])  E83.1517     4. Macular pucker, right eye  H35.371       1.  OU stable overall.  No progression to PDR.  We will continue to monitor and observe  2.  3.  Ophthalmic Meds Ordered this visit:  No orders of the defined types were placed in this encounter.      Return in about 9 months (around 01/01/2023) for DILATE OU, COLOR FP, OCT.  There are no Patient Instructions on file for this visit.   Explained the diagnoses, plan, and follow up with the patient and they expressed understanding.  Patient expressed understanding of the importance of proper follow up care.   Clent Demark Velia Pamer M.D. Diseases & Surgery of the Retina and Vitreous Retina & Diabetic [place] 04/02/22     Abbreviations: M myopia (nearsighted); A astigmatism; H hyperopia (farsighted); P presbyopia; Mrx spectacle prescription;  CTL contact lenses; OD right eye; OS left eye; OU both eyes  XT exotropia; ET esotropia; PEK punctate epithelial keratitis; PEE punctate epithelial erosions; DES dry eye syndrome; MGD meibomian gland dysfunction; ATs artificial tears; PFAT's preservative free artificial tears; [place] nuclear sclerotic  cataract; PSC posterior subcapsular cataract; ERM epi-retinal membrane; PVD posterior vitreous detachment; RD retinal detachment; DM diabetes mellitus; DR diabetic retinopathy; NPDR non-proliferative diabetic retinopathy;  PDR proliferative diabetic retinopathy; CSME clinically significant macular edema; DME diabetic macular edema; dbh dot blot hemorrhages; CWS cotton wool spot; POAG primary open angle glaucoma; C/D cup-to-disc ratio; HVF humphrey visual field; GVF goldmann visual field; OCT optical coherence tomography; IOP intraocular pressure; BRVO Branch retinal vein occlusion; CRVO central retinal vein occlusion; CRAO central retinal artery occlusion; BRAO branch retinal artery occlusion; RT retinal tear; SB scleral buckle; PPV pars plana vitrectomy; VH Vitreous hemorrhage; PRP panretinal laser photocoagulation; IVK intravitreal kenalog; VMT vitreomacular traction; MH Macular hole;  NVD neovascularization of the disc; NVE neovascularization elsewhere; AREDS age related eye disease study; ARMD age related macular degeneration; POAG primary open angle glaucoma; EBMD epithelial/anterior basement membrane dystrophy; ACIOL anterior chamber intraocular lens; IOL intraocular lens; PCIOL posterior chamber intraocular lens; Phaco/IOL phacoemulsification with intraocular lens placement; [place] photorefractive keratectomy; LASIK laser assisted in situ keratomileusis; HTN hypertension; DM diabetes mellitus; COPD chronic obstructive pulmonary disease

## 2022-04-02 NOTE — Assessment & Plan Note (Signed)
Condition resolved from surgery June 2021

## 2022-05-05 ENCOUNTER — Emergency Department (HOSPITAL_COMMUNITY): Payer: Medicare Other

## 2022-05-05 ENCOUNTER — Encounter (HOSPITAL_COMMUNITY): Payer: Self-pay | Admitting: *Deleted

## 2022-05-05 ENCOUNTER — Other Ambulatory Visit: Payer: Self-pay

## 2022-05-05 ENCOUNTER — Emergency Department (HOSPITAL_COMMUNITY)
Admission: EM | Admit: 2022-05-05 | Discharge: 2022-05-05 | Disposition: A | Discharge status: Home / Self Care | Payer: Medicare Other | Attending: Emergency Medicine | Admitting: Emergency Medicine

## 2022-05-05 DIAGNOSIS — Z8546 Personal history of malignant neoplasm of prostate: Secondary | ICD-10-CM | POA: Diagnosis not present

## 2022-05-05 DIAGNOSIS — I129 Hypertensive chronic kidney disease with stage 1 through stage 4 chronic kidney disease, or unspecified chronic kidney disease: Secondary | ICD-10-CM | POA: Diagnosis not present

## 2022-05-05 DIAGNOSIS — E1122 Type 2 diabetes mellitus with diabetic chronic kidney disease: Secondary | ICD-10-CM | POA: Diagnosis not present

## 2022-05-05 DIAGNOSIS — Z79899 Other long term (current) drug therapy: Secondary | ICD-10-CM | POA: Diagnosis not present

## 2022-05-05 DIAGNOSIS — Z992 Dependence on renal dialysis: Secondary | ICD-10-CM | POA: Insufficient documentation

## 2022-05-05 DIAGNOSIS — Z7901 Long term (current) use of anticoagulants: Secondary | ICD-10-CM | POA: Insufficient documentation

## 2022-05-05 DIAGNOSIS — N189 Chronic kidney disease, unspecified: Secondary | ICD-10-CM | POA: Diagnosis not present

## 2022-05-05 DIAGNOSIS — M25552 Pain in left hip: Secondary | ICD-10-CM | POA: Diagnosis present

## 2022-05-05 DIAGNOSIS — M160 Bilateral primary osteoarthritis of hip: Secondary | ICD-10-CM | POA: Insufficient documentation

## 2022-05-05 HISTORY — DX: Dependence on renal dialysis: Z99.2

## 2022-05-05 MED ORDER — LIDOCAINE 5 % EX PTCH: 1.0000 | MEDICATED_PATCH | CUTANEOUS | 0 refills | Status: DC

## 2022-05-05 MED ORDER — LIDOCAINE 5 % EX PTCH
1.0000 | MEDICATED_PATCH | CUTANEOUS | Status: DC
  Administered 2022-05-05: 1 via TRANSDERMAL
  Filled 2022-05-05: qty 1

## 2022-05-05 MED ORDER — HYDROCODONE-ACETAMINOPHEN 5-325 MG PO TABS
1.0000 | ORAL_TABLET | Freq: Once | ORAL | Status: AC
  Administered 2022-05-05: 1 via ORAL
  Filled 2022-05-05: qty 1

## 2022-05-05 MED ORDER — HYDROCODONE-ACETAMINOPHEN 5-325 MG PO TABS: 1.0000 | ORAL_TABLET | Freq: Four times a day (QID) | ORAL | 0 refills | Status: DC | PRN

## 2022-05-05 NOTE — ED Provider Notes (Signed)
[facility] EMERGENCY DEPARTMENT Provider Note   CSN: [id number] Arrival date & time: 05/05/22  1004     History  Chief Complaint  Patient presents with   Hip Pain    Justin [name] is a 76 y.o. 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  He describes waking Sunday morning with significant pain at his left anterior hip region which is triggered by any movement such as standing from a seated position but also with movement such as rolling in bed.  He is symptom-free when he is not moving and walking.  He denies injuries or falls and has had no prior similar symptoms.  He said he spent the day prior to onset of symptoms sitting in a chair watching football games in his home.  He has tried several topical pain relievers including Bengay with no improvement in symptoms.  Denies fevers or chills, abdominal pain, nausea or vomiting.     The history is provided by the patient.       Home Medications Prior to Admission medications   Medication Sig Start Date End Date Taking? Authorizing Provider  allopurinol (ZYLOPRIM) 300 MG tablet Take 300 mg by mouth daily. 10/13/19   [provider]  atorvastatin (LIPITOR) 40 MG tablet Take 40 mg by mouth daily.  10/30/14   [provider]  doxycycline (VIBRAMYCIN) 100 MG capsule Take 1 capsule (100 mg total) by mouth 2 (two) times daily. 02/01/20   Johnson, Clanford L, MD  ELIQUIS 5 MG TABS tablet Take 5 mg by mouth 2 (two) times daily. 10/07/19   [provider]  HYDROcodone-acetaminophen (NORCO/VICODIN) 5-325 MG tablet Take 1 tablet by mouth every 6 (six) hours as needed for severe pain. 05/05/22  Yes Finesse Fielder, Almyra Free, PA-C  lidocaine (LIDODERM) 5 % Place 1 patch onto the skin daily. Remove & Discard patch within 12 hours or as directed by MD 05/05/22   Yes Baylee Mccorkel, Almyra Free, PA-C  metoprolol tartrate (LOPRESSOR) 25 MG tablet Take 1 tablet (25 mg total) by mouth 2 (two) times daily. 02/01/20 03/02/20  Johnson, Clanford L, MD  omeprazole (PRILOSEC) 20 MG capsule Take 20 mg by mouth every morning.     [provider]      Allergies    Patient has no known allergies.    Review of Systems   Review of Systems  Constitutional:  Negative for chills and fever.  Gastrointestinal: Negative.  Negative for abdominal pain, nausea and vomiting.  Musculoskeletal:  Positive for arthralgias. Negative for joint swelling and myalgias.  Skin:  Negative for color change and rash.  Neurological:  Negative for weakness and numbness.  All other systems reviewed and are negative.   Physical Exam Updated Vital Signs BP 104/66 (BP Location: Right Arm)   Pulse 98   Temp 98.1 F (36.7 C) (Oral)   Resp 18   Ht '5\' 10"'$  (1.778 m)   Wt 106.1 kg   SpO2 100%   BMI 33.58 kg/m  Physical Exam Vitals and nursing note reviewed.  Constitutional:      Appearance: He is well-developed.  HENT:     Head: Normocephalic and atraumatic.  Eyes:     Conjunctiva/sclera: Conjunctivae normal.  Cardiovascular:     Rate and Rhythm: Normal rate and regular rhythm.     Heart sounds: Normal heart sounds.  Pulmonary:     Effort: Pulmonary effort is normal.     Breath sounds: Normal breath sounds. No wheezing.  Abdominal:     General: Abdomen is protuberant. Bowel sounds are normal.     Palpations: Abdomen is soft. There is no mass.     Tenderness: There is no abdominal tenderness. There is no guarding.  Musculoskeletal:        General: Normal range of motion.     Cervical back: Normal range of motion.     Comments: Location of pain is at the left lateral lower pelvis just superior to the greater trochanter.  It is not reproducible by palpation however.  Pain is worsened with both active and passive hip flexion and internal rotation, no pain triggered with external hip  rotation.  Skin:    General: Skin is warm and dry.     Comments: No skin rash or erythema.  Neurological:     Mental Status: He is alert.     ED Results / Procedures / Treatments   Labs (all labs ordered are listed, but only abnormal results are displayed) Labs Reviewed - No data to display  EKG None  Radiology DG Hip Unilat W or Wo Pelvis 2-3 Views Left  Result Date: 05/05/2022 CLINICAL DATA:  Left hip pain for 2 days EXAM: DG HIP (WITH OR WITHOUT PELVIS) 2-3V LEFT COMPARISON:  Pelvis and left hip radiographs 11/19/2012, CT abdomen and pelvis 05/25/2020 FINDINGS: Moderate bilateral superomedial acetabular joint space narrowing. Mild-to-moderate bilateral superior acetabular peripheral degenerative osteophytes. Mild bilateral sacroiliac subchondral sclerosis with moderate inferior right and minimal inferior left sacroiliac joint space narrowing on frontal view. This is similar to prior 05/27/2020 CT. Mild bilateral femoral head-neck junction degenerative osteophytes. Mild-to-moderate superior pubic symphysis joint space narrowing with mild peripheral degenerative spurring. Surgical clips overlie the prostate bed. Mild-to-moderate vascular calcifications. IMPRESSION: 1. 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 2. Mild right-greater-than-left sacroiliac osteoarthritis. Electronically Signed   By: Yvonne Kendall M.D.   On: 05/05/2022 11:57    Procedures Procedures    Medications Ordered in ED Medications  lidocaine (LIDODERM) 5 % 1 patch (has no administration in time range)  HYDROcodone-acetaminophen (NORCO/VICODIN) 5-325 MG per tablet 1 tablet (1 tablet Oral Given 05/05/22 1131)    ED Course/ Medical Decision Making/ A&P                           Medical Decision Making  Medical Decision Making Amount and/or Complexity of Data Reviewed Labs: ordered. Radiology: ordered.     This patient presents to the ED for concern of abdominal discomfort, this involves an extensive  number of treatment options, and is a complaint that carries with it a high risk of complications and morbidity.  The differential diagnosis includes osteoarthritis, fracture, bursitis, tendinitis, metastatic bony disease.  Co morbidities that complicate the patient evaluation   Diabetes, chronic kidney failure, atrial flutter on anticoagulation, history of prostate cancer.     Additional history obtained:   Additional history obtained from prior ED visits here.     Lab Tests:   I Ordered, and personally interpreted labs.  The pertinent results include: N/A, no indication for lab testing.     Imaging Studies ordered:   I ordered imaging studies including hip x-ray I independently visualized and interpreted imaging which showed mild to moderate bilateral hip DJD, osteophytes. I agree with the radiologist interpretation     Cardiac Monitoring: / EKG:   Not  indicated    Problem List / ED Course / Critical interventions / Medication management   ED course: Patient remained stable during his ED visit.  He was given a dose of hydrocodone and did obtain some improvement in his pain symptoms.  He presents with a walker, he was encouraged to continue using this walker and he is given referral to orthopedics for further evaluation and management of his symptoms. I ordered medication including hydrocodone. Reevaluation of the patient after these medicines showed that the patient improved. I have reviewed the patients home medicines and have made adjustments as needed     Social Determinants of Health:   Diabetes, history of prostate cancer.     Test / Admission - Considered:   Considered admission to the hospital patient is very stable appearing, he was given hydrocodone tablet while awaiting x-ray here.  Imaging is reassuring with no evidence for bony mets.  No history or exam findings to suggest joint infection.  No indication for admission at this time.         Final  Clinical Impression(s) / ED Diagnoses Final diagnoses:  Primary osteoarthritis of both hips    Rx / DC Orders ED Discharge Orders          Ordered    lidocaine (LIDODERM) 5 %  Every 24 hours        05/05/22 1215    HYDROcodone-acetaminophen (NORCO/VICODIN) 5-325 MG tablet  Every 6 hours PRN        05/05/22 1215              Evalee Jefferson, PA-C 05/05/22 1215    Fransico Meadow, MD 05/05/22 2132

## 2022-05-05 NOTE — ED Triage Notes (Signed)
Pt c/o left hip pain that started Sunday. Reports it hurts when he is ambulating. Pt ambulated to triage with cane, but with difficulty. Denies injury.

## 2022-05-05 NOTE — Discharge Instructions (Addendum)
You do have some arthritis in your hips which is probably the source of today's pain.  You are prescribed a medication to help you with your symptoms.  However I do recommend you following up with an orthopedist for further evaluation and management if your pain persists.  You may also reach out to your primary doctor as well.  I am referring you to Dr. Amedeo Kinsman who is our local orthopedist, you may call to schedule an appointment with him unless you prefer to find an orthopedist closer to your home in [place].  Your primary doctor could help you with this.  The pain pill I am prescribing you can make you drowsy, use caution when taking this medication as you cannot drive within 4 hours of taking it.  It can also cause constipation.  If you choose to take this medication I also recommend taking a stool softener such as Colace to avoid this complication.

## 2022-05-12 ENCOUNTER — Ambulatory Visit (INDEPENDENT_AMBULATORY_CARE_PROVIDER_SITE_OTHER): Payer: Medicare Other | Admitting: Orthopedic Surgery

## 2022-05-12 ENCOUNTER — Encounter: Payer: Self-pay | Admitting: Orthopedic Surgery

## 2022-05-12 VITALS — BP 113/60 | HR 110 | Ht 70.0 in | Wt 234.0 lb

## 2022-05-12 DIAGNOSIS — M1612 Unilateral primary osteoarthritis, left hip: Secondary | ICD-10-CM | POA: Diagnosis not present

## 2022-05-12 DIAGNOSIS — J069 Acute upper respiratory infection, unspecified: Secondary | ICD-10-CM | POA: Insufficient documentation

## 2022-05-12 DIAGNOSIS — R051 Acute cough: Secondary | ICD-10-CM | POA: Insufficient documentation

## 2022-05-12 MED ORDER — METHYLPREDNISOLONE ACETATE 40 MG/ML IJ SUSP
40.0000 mg | Freq: Once | INTRAMUSCULAR | Status: AC
  Administered 2022-05-12: 40 mg via INTRA_ARTICULAR

## 2022-05-12 NOTE — Patient Instructions (Signed)

## 2022-05-12 NOTE — Progress Notes (Signed)
New Patient Visit  Assessment: Justin [name] is a 76 y.o. male with the following: 1. Arthritis of left hip  Plan: Justin [name] has pain in the anterior left hip, as well as left groin.  Sudden onset.  Radiographs demonstrates moderate degenerative changes in the left hip.  I offered him a steroid injection, with the use of ultrasound, and he elected to proceed.  This was completed in clinic today.  He will contact the clinic if he has any issues.  Procedure note injection Left hip joint   Verbal consent was obtained to inject the left hip joint  Timeout was completed to confirm the site of injection.   Ultrasound was used to guide the injection. Hip joint was identified via ultrasound. The skin was prepped with alcohol and ethyl chloride was sprayed at the injection site.  A 21-gauge spinal needle was used to inject 40 mg of Depo-Medrol and 1% lidocaine (3 cc) into the left hip using an anterior approach.  There were no complications. A sterile bandage was applied.   Follow-up: Return if symptoms worsen or fail to improve.  Subjective:  Chief Complaint  Patient presents with   Hip Pain    Left lateral hip painful     History of Present Illness: Justin [name] is a 76 y.o. male who presents for evaluation of left hip pain.  He states the pain started a little over a week ago.  He rolled over in bed.  No prior pain in the left hip or groin area.  Since the onset of pain, he has been using a cane to assist with ambulation.  He describes the pain as in the front of the hip, radiating into the groin.  Medications have not been helpful.  He was seen in the emergency department.   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 ([place]) 12/07/2019   Dialysis patient [facility])    Gout    Hypertension    Right posterior capsular opacification 02/27/2021   Vitreous hemorrhage of right eye ([place]) 10/24/2019    Past Surgical History:  Procedure Laterality Date   A/V FISTULAGRAM Left 08/17/2017   Procedure: A/V FISTULAGRAM;  Surgeon: Katha Cabal, MD;  Location: [facility];  Service: Cardiovascular;  Laterality: Left;   A/V SHUNT INTERVENTION N/A 08/17/2017   Procedure: A/V SHUNT INTERVENTION;  Surgeon: Katha Cabal, MD;  Location: [facility];  Service: Cardiovascular;  Laterality: N/A;   AV FISTULA PLACEMENT Left 05/31/2015   Procedure: ARTERIOVENOUS (AV) FISTULA CREATION;  Surgeon: Katha Cabal, MD;  Location: [facility];  Service: Vascular;  Laterality: Left;   excision bone spurs Bilateral 1989   feet   KNEE SURGERY Left 1998   arthroscopy   SHOULDER SURGERY Left 1994   rotator cuff    Family History  Problem Relation Age of Onset   Kidney disease Mother    Alcoholism Father    Alcoholism Brother    Social History  Tobacco Use   Smoking status: Former    Types: Cigarettes    Quit date: 10/01/1978    Years since quitting: 43.6   Smokeless tobacco: Never  Vaping Use   Vaping Use: Never used  Substance Use Topics   Alcohol use: No   Drug use: No    No Known Allergies  Current Meds  Medication Sig   allopurinol (ZYLOPRIM) 100 MG tablet Take 1 tablet every day by oral route.   amiodarone (PACERONE) 200 MG tablet Take 200 mg by mouth daily.   atorvastatin (LIPITOR) 40 MG tablet Take 40 mg by mouth daily.    carvedilol (COREG) 25 MG tablet Take by mouth.   ELIQUIS 5 MG TABS tablet Take 5 mg by mouth 2 (two) times daily.   HYDROcodone-acetaminophen (NORCO/VICODIN) 5-325 MG  tablet Take 1 tablet by mouth every 6 (six) hours as needed for severe pain.   omeprazole (PRILOSEC) 20 MG capsule Take 20 mg by mouth every morning.    [DISCONTINUED] lidocaine (LIDODERM) 5 % Place 1 patch onto the skin daily. Remove & Discard patch within 12 hours or as directed by MD    Objective: BP 113/60   Pulse (!) 110   Ht '5\' 10"'$  (1.778 m)   Wt 234 lb (106.1 kg)   BMI 33.58 kg/m   Physical Exam:  General: Elderly male., Alert and oriented., and No acute distress. Gait: Left sided antalgic gait. and Ambulates with the assistance of a cane  Evaluation left hip demonstrates no deformity.  No atrophy appreciated.  No bruising.  No tenderness to palpation over the greater trochanter.  He has limited internal and external rotation of the left hip.  He does note recreation of the pain with internal rotation.    IMAGING: I personally reviewed images previously obtained from the ED  X-ray left hip demonstrates moderate degenerative changes with loss of joint space and some associated osteophytes.   New Medications:  No orders of the defined types were placed in this encounter.     Mordecai Rasmussen, MD  05/12/2022 3:10 PM

## 2022-06-12 ENCOUNTER — Encounter (HOSPITAL_COMMUNITY): Payer: Self-pay | Admitting: *Deleted

## 2022-06-12 ENCOUNTER — Emergency Department (HOSPITAL_COMMUNITY): Payer: Medicare Other

## 2022-06-12 ENCOUNTER — Other Ambulatory Visit: Payer: Self-pay

## 2022-06-12 ENCOUNTER — Inpatient Hospital Stay (HOSPITAL_COMMUNITY)
Admission: EM | Admit: 2022-06-12 | Discharge: 2022-06-26 | DRG: 853 | Disposition: A | Discharge status: Skilled Nursing Facility | Payer: Medicare Other | Attending: Internal Medicine | Admitting: Internal Medicine

## 2022-06-12 DIAGNOSIS — E669 Obesity, unspecified: Secondary | ICD-10-CM | POA: Diagnosis present

## 2022-06-12 DIAGNOSIS — A419 Sepsis, unspecified organism: Secondary | ICD-10-CM | POA: Diagnosis not present

## 2022-06-12 DIAGNOSIS — K612 Anorectal abscess: Secondary | ICD-10-CM | POA: Diagnosis present

## 2022-06-12 DIAGNOSIS — I132 Hypertensive heart and chronic kidney disease with heart failure and with stage 5 chronic kidney disease, or end stage renal disease: Secondary | ICD-10-CM | POA: Diagnosis present

## 2022-06-12 DIAGNOSIS — E11311 Type 2 diabetes mellitus with unspecified diabetic retinopathy with macular edema: Secondary | ICD-10-CM | POA: Diagnosis present

## 2022-06-12 DIAGNOSIS — I4891 Unspecified atrial fibrillation: Secondary | ICD-10-CM | POA: Diagnosis not present

## 2022-06-12 DIAGNOSIS — I350 Nonrheumatic aortic (valve) stenosis: Secondary | ICD-10-CM | POA: Diagnosis present

## 2022-06-12 DIAGNOSIS — Z6372 Alcoholism and drug addiction in family: Secondary | ICD-10-CM

## 2022-06-12 DIAGNOSIS — J9601 Acute respiratory failure with hypoxia: Secondary | ICD-10-CM | POA: Diagnosis not present

## 2022-06-12 DIAGNOSIS — E1122 Type 2 diabetes mellitus with diabetic chronic kidney disease: Secondary | ICD-10-CM | POA: Diagnosis present

## 2022-06-12 DIAGNOSIS — E11649 Type 2 diabetes mellitus with hypoglycemia without coma: Secondary | ICD-10-CM | POA: Diagnosis not present

## 2022-06-12 DIAGNOSIS — E782 Mixed hyperlipidemia: Secondary | ICD-10-CM | POA: Diagnosis present

## 2022-06-12 DIAGNOSIS — B9620 Unspecified Escherichia coli [E. coli] as the cause of diseases classified elsewhere: Secondary | ICD-10-CM | POA: Diagnosis present

## 2022-06-12 DIAGNOSIS — Y92239 Unspecified place in hospital as the place of occurrence of the external cause: Secondary | ICD-10-CM | POA: Diagnosis not present

## 2022-06-12 DIAGNOSIS — I472 Ventricular tachycardia, unspecified: Secondary | ICD-10-CM | POA: Diagnosis not present

## 2022-06-12 DIAGNOSIS — Z87891 Personal history of nicotine dependence: Secondary | ICD-10-CM | POA: Diagnosis not present

## 2022-06-12 DIAGNOSIS — K611 Rectal abscess: Principal | ICD-10-CM

## 2022-06-12 DIAGNOSIS — I4892 Unspecified atrial flutter: Secondary | ICD-10-CM | POA: Diagnosis present

## 2022-06-12 DIAGNOSIS — I959 Hypotension, unspecified: Secondary | ICD-10-CM | POA: Diagnosis not present

## 2022-06-12 DIAGNOSIS — I950 Idiopathic hypotension: Secondary | ICD-10-CM

## 2022-06-12 DIAGNOSIS — T383X5A Adverse effect of insulin and oral hypoglycemic [antidiabetic] drugs, initial encounter: Secondary | ICD-10-CM | POA: Diagnosis not present

## 2022-06-12 DIAGNOSIS — K219 Gastro-esophageal reflux disease without esophagitis: Secondary | ICD-10-CM | POA: Diagnosis not present

## 2022-06-12 DIAGNOSIS — D631 Anemia in chronic kidney disease: Secondary | ICD-10-CM | POA: Diagnosis present

## 2022-06-12 DIAGNOSIS — E1142 Type 2 diabetes mellitus with diabetic polyneuropathy: Secondary | ICD-10-CM | POA: Diagnosis present

## 2022-06-12 DIAGNOSIS — N186 End stage renal disease: Secondary | ICD-10-CM

## 2022-06-12 DIAGNOSIS — I462 Cardiac arrest due to underlying cardiac condition: Secondary | ICD-10-CM | POA: Diagnosis not present

## 2022-06-12 DIAGNOSIS — Z923 Personal history of irradiation: Secondary | ICD-10-CM

## 2022-06-12 DIAGNOSIS — Z1624 Resistance to multiple antibiotics: Secondary | ICD-10-CM | POA: Diagnosis present

## 2022-06-12 DIAGNOSIS — Z8546 Personal history of malignant neoplasm of prostate: Secondary | ICD-10-CM

## 2022-06-12 DIAGNOSIS — M7989 Other specified soft tissue disorders: Secondary | ICD-10-CM | POA: Diagnosis present

## 2022-06-12 DIAGNOSIS — I878 Other specified disorders of veins: Secondary | ICD-10-CM | POA: Diagnosis present

## 2022-06-12 DIAGNOSIS — K5900 Constipation, unspecified: Secondary | ICD-10-CM | POA: Diagnosis present

## 2022-06-12 DIAGNOSIS — R000 Tachycardia, unspecified: Secondary | ICD-10-CM

## 2022-06-12 DIAGNOSIS — I5032 Chronic diastolic (congestive) heart failure: Secondary | ICD-10-CM | POA: Diagnosis present

## 2022-06-12 DIAGNOSIS — R6521 Severe sepsis with septic shock: Secondary | ICD-10-CM | POA: Diagnosis present

## 2022-06-12 DIAGNOSIS — K610 Anal abscess: Secondary | ICD-10-CM

## 2022-06-12 DIAGNOSIS — I120 Hypertensive chronic kidney disease with stage 5 chronic kidney disease or end stage renal disease: Secondary | ICD-10-CM | POA: Diagnosis not present

## 2022-06-12 DIAGNOSIS — I493 Ventricular premature depolarization: Secondary | ICD-10-CM | POA: Diagnosis not present

## 2022-06-12 DIAGNOSIS — I10 Essential (primary) hypertension: Secondary | ICD-10-CM

## 2022-06-12 DIAGNOSIS — M109 Gout, unspecified: Secondary | ICD-10-CM | POA: Diagnosis present

## 2022-06-12 DIAGNOSIS — N185 Chronic kidney disease, stage 5: Secondary | ICD-10-CM | POA: Diagnosis not present

## 2022-06-12 DIAGNOSIS — E1165 Type 2 diabetes mellitus with hyperglycemia: Secondary | ICD-10-CM | POA: Diagnosis present

## 2022-06-12 DIAGNOSIS — J9811 Atelectasis: Secondary | ICD-10-CM | POA: Diagnosis present

## 2022-06-12 DIAGNOSIS — Z79899 Other long term (current) drug therapy: Secondary | ICD-10-CM

## 2022-06-12 DIAGNOSIS — I4729 Other ventricular tachycardia: Secondary | ICD-10-CM | POA: Diagnosis not present

## 2022-06-12 DIAGNOSIS — Z6836 Body mass index (BMI) 36.0-36.9, adult: Secondary | ICD-10-CM

## 2022-06-12 DIAGNOSIS — Z7901 Long term (current) use of anticoagulants: Secondary | ICD-10-CM

## 2022-06-12 DIAGNOSIS — Z841 Family history of disorders of kidney and ureter: Secondary | ICD-10-CM

## 2022-06-12 DIAGNOSIS — J9602 Acute respiratory failure with hypercapnia: Secondary | ICD-10-CM | POA: Diagnosis not present

## 2022-06-12 DIAGNOSIS — E8889 Other specified metabolic disorders: Secondary | ICD-10-CM | POA: Diagnosis present

## 2022-06-12 DIAGNOSIS — Z992 Dependence on renal dialysis: Secondary | ICD-10-CM

## 2022-06-12 LAB — BASIC METABOLIC PANEL
Anion gap: 13 (ref 5–15)
BUN: 21 mg/dL (ref 8–23)
CO2: 32 mmol/L (ref 22–32)
Calcium: 8.6 mg/dL — ABNORMAL LOW (ref 8.9–10.3)
Chloride: 95 mmol/L — ABNORMAL LOW (ref 98–111)
Creatinine, Ser: 5.32 mg/dL — ABNORMAL HIGH (ref 0.61–1.24)
GFR, Estimated: 10 mL/min — ABNORMAL LOW (ref >60)
Glucose, Bld: 154 mg/dL — ABNORMAL HIGH (ref 70–99)
Potassium: 3.5 mmol/L (ref 3.5–5.1)
Sodium: 140 mmol/L (ref 135–145)

## 2022-06-12 LAB — CBC WITH DIFFERENTIAL/PLATELET
Abs Immature Granulocytes: 0.13 10*3/uL — ABNORMAL HIGH (ref 0.00–0.07)
Basophils Absolute: 0 10*3/uL (ref 0.0–0.1)
Basophils Relative: 0 %
Eosinophils Absolute: 0 10*3/uL (ref 0.0–0.5)
Eosinophils Relative: 0 %
HCT: 34.4 % — ABNORMAL LOW (ref 39.0–52.0)
Hemoglobin: 10.8 g/dL — ABNORMAL LOW (ref 13.0–17.0)
Immature Granulocytes: 1 %
Lymphocytes Relative: 7 %
Lymphs Abs: 1.2 10*3/uL (ref 0.7–4.0)
MCH: 31.6 pg (ref 26.0–34.0)
MCHC: 31.4 g/dL (ref 30.0–36.0)
MCV: 100.6 fL — ABNORMAL HIGH (ref 80.0–100.0)
Monocytes Absolute: 1.2 10*3/uL — ABNORMAL HIGH (ref 0.1–1.0)
Monocytes Relative: 7 %
Neutro Abs: 14.7 10*3/uL — ABNORMAL HIGH (ref 1.7–7.7)
Neutrophils Relative %: 85 %
Platelets: 209 10*3/uL (ref 150–400)
RBC: 3.42 MIL/uL — ABNORMAL LOW (ref 4.22–5.81)
RDW: 14.4 % (ref 11.5–15.5)
WBC: 17.2 10*3/uL — ABNORMAL HIGH (ref 4.0–10.5)
nRBC: 0 % (ref 0.0–0.2)

## 2022-06-12 MED ORDER — PIPERACILLIN-TAZOBACTAM 3.375 G IVPB 30 MIN
3.3750 g | Freq: Once | INTRAVENOUS | Status: AC
  Administered 2022-06-12: 3.375 g via INTRAVENOUS
  Filled 2022-06-12: qty 50

## 2022-06-12 MED ORDER — IOHEXOL 300 MG/ML  SOLN
100.0000 mL | Freq: Once | INTRAMUSCULAR | Status: AC | PRN
  Administered 2022-06-12: 100 mL via INTRAVENOUS

## 2022-06-12 MED ORDER — PIPERACILLIN-TAZOBACTAM IN DEX 2-0.25 GM/50ML IV SOLN
2.2500 g | Freq: Three times a day (TID) | INTRAVENOUS | Status: DC
  Administered 2022-06-13: 2.25 g via INTRAVENOUS
  Filled 2022-06-12 (×5): qty 50

## 2022-06-12 MED ORDER — VANCOMYCIN HCL 1500 MG/300ML IV SOLN
1500.0000 mg | Freq: Once | INTRAVENOUS | Status: AC
  Administered 2022-06-12: 1500 mg via INTRAVENOUS
  Filled 2022-06-12: qty 300

## 2022-06-12 NOTE — Progress Notes (Signed)
Pharmacy Antibiotic Note  Justin [name] is a 76 y.o. male admitted on 06/12/2022 with  wound infection .  Pharmacy has been consulted for vanc/zosyn dosing.  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 Vanc/zosyn ordered empirically.   Plan: Vanc 1.5g IV x1 Zosyn 3.375g IV x1 then 2.25g IV q8 F/u HD schedule for further vanc dosing  Height: '5\' 10"'$  (177.8 cm) Weight: 106.1 kg (234 lb) IBW/kg (Calculated) : 73  Temp (24hrs), Avg:99.4 F (37.4 C), Min:99.4 F (37.4 C), Max:99.4 F (37.4 C)  Recent Labs  Lab 06/12/22 1832  WBC 17.2*  CREATININE 5.32*    Estimated Creatinine Clearance: 14 mL/min (A) (by C-G formula based on SCr of 5.32 mg/dL (H)).    No Known Allergies  Antimicrobials this admission: 12/1 vanc>> 12/1 zosyn>>  Dose adjustments this admission:   Microbiology results:    Onnie Boer, PharmD, BCIDP, AAHIVP, CPP Infectious Disease Pharmacist 06/12/2022 8:41 PM

## 2022-06-12 NOTE — ED Provider Notes (Signed)
This patient presents with a leukocytosis of 17,000 and a swollen right perirectal area.  CT scan was ordered and shows in fact that the patient has a deep abscess with gas-forming products going down towards the perineum.  I discussed the case with Dr. Aviva Signs who will come and see the patient in the morning and recommends hospitalist admission.  The patient is anticoagulated on Eliquis.  This was discussed with Dr. Josephine Cables, the anticoagulant will be held, antibiotics given, patient stable and can be admitted to the hospital.  He does not appear to be in sepsis, no fever, no tachycardia  Vancomycin and Zosyn ordered  Medical screening examination/treatment/procedure(s) were conducted as a shared visit with non-physician practitioner(s) and myself.  I personally evaluated the patient during the encounter.  Clinical Impression:   Final diagnoses:  Perirectal abscess         Noemi Chapel, MD 06/12/22 2044

## 2022-06-12 NOTE — ED Notes (Signed)
Patient given bag lunch and drink with clearance from admitting Hospitalist. RN made aware

## 2022-06-12 NOTE — ED Provider Notes (Signed)
[facility] EMERGENCY DEPARTMENT Provider Note   CSN: [id number] Arrival date & time: 06/12/22  1634     History  Chief Complaint  Patient presents with   Abscess    Justin [name] is a 76 y.o. male with past medical history hypertension, diabetes, end-stage renal disease on hemodialysis who presents to the ED complaining of possible abscess to left buttock. He states he has had symptoms for the last month and has significant pain when trying to sit on his buttocks so he came to ED today for evaluation. He denies fever, chills, abdominal pain, shortness of breath, chest pain, or other symptoms at home. Reports he has been trying to use warm compresses without resolution of symptoms. States he had an abscess to his back that required incision and drainage 2 months ago and he was on antibiotics for this but has not been on any antibiotics since that time. Reports he has intermittent constipation but last bowel movements have been normal including one yesterday. No longer makes urine but has been attending his dialysis sessions as scheduled. Reports his blood glucose levels at home have been 110-130.      Home Medications Prior to Admission medications   Medication Sig Start Date End Date Taking? Authorizing Provider  allopurinol (ZYLOPRIM) 100 MG tablet Take 1 tablet every day by oral route. 09/13/21   [provider]  allopurinol (ZYLOPRIM) 300 MG tablet Take 300 mg by mouth daily. Patient not taking: Reported on 05/12/2022 10/13/19   [provider]  amiodarone (PACERONE) 200 MG tablet Take 200 mg by mouth daily. 03/18/22   [provider]  atorvastatin (LIPITOR) 40 MG tablet Take 40 mg by mouth daily.  10/30/14   [provider]  carvedilol (COREG) 25 MG tablet Take by mouth. 04/17/10   [provider]  cinacalcet (SENSIPAR) 30 MG tablet SMARTSIG:1 Tablet(s) By Mouth Every Evening Patient not taking: Reported on 05/12/2022 05/06/22   [provider]  Doxercalciferol (HECTOROL IV) Doxercalciferol (Hectorol) 08/25/21 08/24/22  [provider]  ELIQUIS 5 MG TABS tablet Take 5 mg by mouth 2 (two) times daily. 10/07/19   [provider]  HYDROcodone-acetaminophen (NORCO/VICODIN) 5-325 MG tablet Take 1 tablet by mouth every 6 (six) hours as needed for severe pain. 05/05/22   Idol, Almyra Free, PA-C  LOKELMA 10 g PACK packet Take 10 g by mouth 2 (two) times a week. 06/08/22   [provider]  Methoxy PEG-Epoetin Beta (MIRCERA IJ) Mircera 06/13/21 12/18/22  [provider]  omeprazole (PRILOSEC) 20 MG capsule Take 20 mg by mouth every morning.     [provider]  VELPHORO 500 MG chewable tablet Chew 500 mg by mouth 2 (two) times daily. 06/03/22   [provider]      Allergies    Patient has no known allergies.    Review of Systems   Review of Systems  Constitutional:  Negative for chills and fever.  HENT:  Negative for ear pain and sore throat.   Eyes:  Negative for pain and visual disturbance.  Respiratory:  Negative for cough and shortness of breath.   Cardiovascular:  Negative for chest pain and palpitations.  Gastrointestinal:  Negative for abdominal pain and vomiting.  Genitourinary:  Negative for dysuria and hematuria.  Musculoskeletal:  Negative for arthralgias and back pain.  Skin:        Lesion to buttock  Neurological:  Negative for seizures and syncope.  All other systems reviewed and are negative.  Physical Exam Updated Vital Signs BP (!) 99/50 (BP Location: Right Arm)   Pulse (!) 110   Temp 99.4 F (37.4 C) (Oral)   Resp (!) 28   Ht '5\' 10"'$  (1.778 m)   Wt 106.1 kg   SpO2 97%   BMI 33.58 kg/m  Physical Exam Vitals and nursing note reviewed. Exam conducted with a chaperone present.  Constitutional:      General: He is not in acute distress.    Appearance: Normal appearance. He is not ill-appearing, toxic-appearing or diaphoretic.  HENT:     Head:  Normocephalic and atraumatic.     Mouth/Throat:     Mouth: Mucous membranes are moist.  Eyes:     Conjunctiva/sclera: Conjunctivae normal.  Cardiovascular:     Rate and Rhythm: Normal rate and regular rhythm.     Heart sounds: No murmur heard. Pulmonary:     Effort: Pulmonary effort is normal.     Breath sounds: Normal breath sounds.  Abdominal:     General: Abdomen is flat. There is no distension.     Palpations: Abdomen is soft.     Tenderness: There is no abdominal tenderness. There is no guarding or rebound.  Genitourinary:    Comments: Left buttock with small scabbed over lesion without erythema, increased warmth, or swelling but with moderate tenderness to palpation, ~4cm indurated area to right buttock, peri-rectal area that is exquisitely tender to palpation, no increased warmth or erythema Musculoskeletal:        General: Normal range of motion.     Cervical back: Neck supple.     Right lower leg: No edema.     Left lower leg: No edema.  Skin:    General: Skin is warm and dry.     Capillary Refill: Capillary refill takes less than 2 seconds.  Neurological:     Mental Status: He is alert. Mental status is at baseline.  Psychiatric:        Behavior: Behavior normal.     ED Results / Procedures / Treatments   Labs (all labs ordered are listed, but only abnormal results are displayed) Labs Reviewed  CBC WITH DIFFERENTIAL/PLATELET - Abnormal; Notable for the following components:      Result Value   WBC 17.2 (*)    RBC 3.42 (*)    Hemoglobin 10.8 (*)    HCT 34.4 (*)    MCV 100.6 (*)    Neutro Abs 14.7 (*)    Monocytes Absolute 1.2 (*)    Abs Immature Granulocytes 0.13 (*)    All other components within normal limits  BASIC METABOLIC PANEL - Abnormal; Notable for the following components:   Chloride 95 (*)    Glucose, Bld 154 (*)    Creatinine, Ser 5.32 (*)    Calcium 8.6 (*)    GFR, Estimated 10 (*)    All other components within normal limits     EKG None  Radiology CT PELVIS W CONTRAST  Result Date: 06/12/2022 CLINICAL DATA:  Left buttock abscess for 1 month EXAM: CT PELVIS WITH CONTRAST TECHNIQUE: Multidetector CT imaging of the pelvis was performed using the standard protocol following the bolus administration of intravenous contrast. RADIATION DOSE REDUCTION: This exam was performed according to the departmental dose-optimization program which includes automated exposure control, adjustment of the mA and/or kV according to patient size and/or use of iterative reconstruction technique. CONTRAST:  147m OMNIPAQUE IOHEXOL 300 MG/ML  SOLN COMPARISON:  05/05/2022, 05/27/2020 FINDINGS: Urinary Tract: Distal ureters are  unremarkable. Bladder is decompressed, limiting its evaluation. Bowel: No bowel obstruction or ileus. Normal appendix right lower quadrant. No bowel wall thickening or inflammatory change. Vascular/Lymphatic: Atherosclerosis of the aorta and its distal branches. No pathologically enlarged lymph nodes within the pelvis. Reproductive: Prostate is enlarged, measuring 5.5 x 4.3 cm. Fiduciary markers are seen within the prostate. Other: There is no free fluid or free intraperitoneal gas. Fat containing umbilical hernia is noted. No bowel herniation. Musculoskeletal: Multilocular gas and fluid collections are seen within the buttocks, consistent with perianal abscesses. The largest gas and fluid collection within the right gluteal region measures 6.3 x 2.7 cm, reference image 50/2. This extends anteriorly into the perineum. More inferiorly within the buttocks are multilocular fluid collections, measuring up to 2.9 x 2.5 cm on the right and 3.8 x 2.5 cm on the left. I do not see any direct communication with the bowel lumen to suggest fistula. IMPRESSION: 1. Multilocular gas and fluid collection within the perianal region, extending from the perineum into the bilateral inferior buttocks, consistent with abscess. I do not see any  communication between the gas and fluid collections and the bowel lumen. 2. Enlarged prostate. 3.  Aortic Atherosclerosis (ICD10-I70.0). Electronically Signed   By: Randa Ngo M.D.   On: 06/12/2022 20:01    Procedures None  Medications Ordered in ED Medications  vancomycin (VANCOREADY) IVPB 1500 mg/300 mL (has no administration in time range)  piperacillin-tazobactam (ZOSYN) IVPB 3.375 g (has no administration in time range)  piperacillin-tazobactam (ZOSYN) IVPB 2.25 g (has no administration in time range)  iohexol (OMNIPAQUE) 300 MG/ML solution 100 mL (100 mLs Intravenous Contrast Given 06/12/22 1935)    ED Course/ Medical Decision Making/ A&P Clinical Course as of 06/12/22 2052  Fri Jun 12, 2022  2012 Gust the case with Dr. Arnoldo Morale who will see the patient in the morning.  Because the patient is on Eliquis this will need to be held, hospitalist to admit [BM]    Clinical Course User Index [BM] Noemi Chapel, MD                           Medical Decision Making Amount and/or Complexity of Data Reviewed Labs: ordered. Decision-making details documented in ED Course. Radiology: ordered. Decision-making details documented in ED Course.  Risk Prescription drug management. Decision regarding hospitalization.   This is a 37-year-year-old male who presents to the ED complaining of buttock pain with concern for an abscess.  He complains of pain to what appears to be small nodule to left buttock that is moderately tender but has no overt signs of infection including erythema, increased warmth, significant swelling, and no drainable pocket of purulence.  When further evaluating the Pyridium, there is a larger area of induration to the right buttock that is exquisitely tender but still without obvious signs of infection.  Patient is generally well-appearing with stable vital signs.  He has had no other symptoms with this complaint.  With physical exam findings and pain out of proportion, CT  scan of the pelvis was obtained to further assess for perirectal abscess versus fistula. Discussed case with surgeon who will do a washout at later time as pt is anticoagulated on Eliquis. Will stop anticoagulant tonight and keep pt NPO starting 12/3 for surgery. Pt admitted to hospitalist team. Patient evaluated and case discussed with attending physician who agrees with plan.          Final Clinical Impression(s) / ED Diagnoses  Final diagnoses:  Perirectal abscess    Rx / DC Orders ED Discharge Orders     None         Turner Daniels 06/12/22 2053    Noemi Chapel, MD 06/15/22 1110

## 2022-06-12 NOTE — H&P (Incomplete)
History and Physical    Patient: Justin [name] YQM:[id number] DOB: [birth year] DOA: 06/12/2022 DOS: the patient was seen and examined on 06/13/2022 PCP: Justin Heinrich, DO  Patient coming from: Home  Chief Complaint:  Chief Complaint  Patient presents with   Abscess   HPI: Justin [name] is a 76 y.o. male with medical history significant of ESRD on HD (MWF), atrial fibrillation on Eliquis, GERD, hyperlipidemia, hypertension who presents to the emergency department due to 1 month of progressive pain in the buttock.  Patient states that it started with a boil and that after the boil bursts, he started to feel pain when he tries to sit, the pain is progressively worsened since onset of symptoms, so he decided to go to the ED for further evaluation and management.  warm compresses used at home did not relieve the symptoms.  About 2 months ago, patient states that he had an abscess to his back that required incision and drainage and that he had to take antibiotics for this. Patient denies chest pain, shortness of breath, palpitations, headache, nausea, vomiting, abdominal pain. Last dialysis was today.  ED Course:  In the emergency department, temperature was 100.2 F, he was tachycardic and tachypneic and BP was soft at 99/50, but O2 sat was normal at 97% on room air. Workup in the ED: CBC: WBC 17.2, hemoglobin 10.8, hematocrit 34.4, MCV 100.6, platelets 209 BMP: Sodium 140, potassium 3.5, chloride 95, bicarb 32, glucose 154, BUN 21, creatinine 5.32, EGFR 10 CT pelvis with contrast showed multilocular  gas and fluid collection within the perianal region, extending from the perineum into the bilateral inferior buttocks, consistent with abscess. He was treated with IV Vanco and Zosyn.  General surgery (Dr. Arnoldo Morale) was consulted and recommended admitting patient with plan to consult on patient in the morning.   Review of Systems: Review of systems as noted in the HPI. All other systems reviewed and  are negative.   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 ([place]) 12/07/2019   Dialysis patient [facility])    Gout    Hypertension    Right posterior capsular opacification 02/27/2021   Vitreous hemorrhage of right eye ([place]) 10/24/2019   Past Surgical History:  Procedure Laterality Date   A/V FISTULAGRAM Left 08/17/2017   Procedure: A/V FISTULAGRAM;  Surgeon: Katha Cabal, MD;  Location: [place] [facility];  Service: Cardiovascular;  Laterality: Left;   A/V SHUNT INTERVENTION N/A 08/17/2017   Procedure: A/V SHUNT INTERVENTION;  Surgeon: Katha Cabal, MD;  Location: [place] [facility];  Service: Cardiovascular;  Laterality: N/A;   AV FISTULA PLACEMENT Left 05/31/2015   Procedure: ARTERIOVENOUS (AV) FISTULA CREATION;  Surgeon: Katha Cabal, MD;  Location: [facility] ORS;  Service: Vascular;  Laterality: Left;   excision bone spurs Bilateral 1989   feet   KNEE SURGERY Left 1998   arthroscopy   SHOULDER SURGERY Left 1994   rotator cuff    Social History:  reports that he quit smoking about 43 years ago. His smoking use included cigarettes. He has never used smokeless tobacco. He reports that he does not drink alcohol and does not use drugs.   No Known Allergies  Family History  Problem Relation Age of Onset   Kidney disease Mother    Alcoholism Father    Alcoholism Brother      Prior to Admission medications   Medication Sig Start Date End Date Taking? Authorizing Provider  allopurinol (ZYLOPRIM) 100 MG tablet Take 1 tablet every day by oral route. 09/13/21   [provider]  allopurinol (ZYLOPRIM) 300 MG tablet Take 300 mg by mouth daily. Patient not taking: Reported on 05/12/2022 10/13/19    [provider]  amiodarone (PACERONE) 200 MG tablet Take 200 mg by mouth daily. 03/18/22   [provider]  atorvastatin (LIPITOR) 40 MG tablet Take 40 mg by mouth daily.  10/30/14   [provider]  carvedilol (COREG) 25 MG tablet Take by mouth. 04/17/10   [provider]  cinacalcet (SENSIPAR) 30 MG tablet SMARTSIG:1 Tablet(s) By Mouth Every Evening Patient not taking: Reported on 05/12/2022 05/06/22   [provider]  Doxercalciferol (HECTOROL IV) Doxercalciferol (Hectorol) 08/25/21 08/24/22  [provider]  ELIQUIS 5 MG TABS tablet Take 5 mg by mouth 2 (two) times daily. 10/07/19   [provider]  HYDROcodone-acetaminophen (NORCO/VICODIN) 5-325 MG tablet Take 1 tablet by mouth every 6 (six) hours as needed for severe pain. 05/05/22   Idol, Almyra Free, PA-C  LOKELMA 10 g PACK packet Take 10 g by mouth 2 (two) times a week. 06/08/22   [provider]  Methoxy PEG-Epoetin Beta (MIRCERA IJ) Mircera 06/13/21 12/18/22  [provider]  omeprazole (PRILOSEC) 20 MG capsule Take 20 mg by mouth every morning.     [provider]  VELPHORO 500 MG chewable tablet Chew 500 mg by mouth 2 (two) times daily. 06/03/22   [provider]    Physical Exam: BP (!) 91/57   Pulse (!) 108   Temp 100 F (37.8 C) (Oral)   Resp (!) 22   Ht _0  (1.778 m)   Wt 106.1 kg   SpO2 96%   BMI 33.58 kg/m   General: 76 y.o. year-old male well developed well nourished in no acute distress.  Alert and oriented x3. HEENT: NCAT, EOMI Neck: Supple, trachea medial Cardiovascular: Regular rate and rhythm with no rubs or gallops.  No thyromegaly or JVD noted.  No lower extremity edema. 2/4 pulses in all 4 extremities. Respiratory: Clear to auscultation with no wheezes or rales. Good inspiratory effort. Abdomen: Soft, nontender nondistended with normal bowel sounds x4 quadrants. Genitourinary: Approximate 4 cm indurated area to right  buttock, perirectal tender to palpation Muskuloskeletal: No cyanosis, clubbing or edema noted bilaterally Neuro: CN II-XII intact, strength 5/5 x 4, sensation, reflexes intact Skin: No ulcerative lesions noted or rashes Psychiatry: Judgement and insight appear normal. Mood is appropriate for condition and setting          Labs on Admission:  Basic Metabolic Panel: Recent Labs  Lab 06/12/22 1832  NA 140  K 3.5  CL 95*  CO2 32  GLUCOSE 154*  BUN 21  CREATININE 5.32*  CALCIUM 8.6*   Liver Function Tests: No results for input(s): "AST", "ALT", "ALKPHOS", "BILITOT", "PROT", "ALBUMIN" in the last 168 hours. No results for input(s): "LIPASE", "AMYLASE" in the last 168 hours. No results for input(s): "AMMONIA" in the last 168 hours. CBC: Recent Labs  Lab 06/12/22 1832  WBC 17.2*  NEUTROABS 14.7*  HGB 10.8*  HCT 34.4*  MCV 100.6*  PLT 209   Cardiac Enzymes: No results for input(s): "CKTOTAL", "CKMB", "CKMBINDEX", "TROPONINI" in  the last 168 hours.  BNP (last 3 results) No results for input(s): "BNP" in the last 8760 hours.  ProBNP (last 3 results) No results for input(s): "PROBNP" in the last 8760 hours.  CBG: No results for input(s): "GLUCAP" in the last 168 hours.  Radiological Exams on Admission: CT PELVIS W CONTRAST  Result Date: 06/12/2022 CLINICAL DATA:  Left buttock abscess for 1 month EXAM: CT PELVIS WITH CONTRAST TECHNIQUE: Multidetector CT imaging of the pelvis was performed using the standard protocol following the bolus administration of intravenous contrast. RADIATION DOSE REDUCTION: This exam was performed according to the departmental dose-optimization program which includes automated exposure control, adjustment of the mA and/or kV according to patient size and/or use of iterative reconstruction technique. CONTRAST:  163m OMNIPAQUE IOHEXOL 300 MG/ML  SOLN COMPARISON:  05/05/2022, 05/27/2020 FINDINGS: Urinary Tract: Distal ureters are unremarkable. Bladder  is decompressed, limiting its evaluation. Bowel: No bowel obstruction or ileus. Normal appendix right lower quadrant. No bowel wall thickening or inflammatory change. Vascular/Lymphatic: Atherosclerosis of the aorta and its distal branches. No pathologically enlarged lymph nodes within the pelvis. Reproductive: Prostate is enlarged, measuring 5.5 x 4.3 cm. Fiduciary markers are seen within the prostate. Other: There is no free fluid or free intraperitoneal gas. Fat containing umbilical hernia is noted. No bowel herniation. Musculoskeletal: Multilocular gas and fluid collections are seen within the buttocks, consistent with perianal abscesses. The largest gas and fluid collection within the right gluteal region measures 6.3 x 2.7 cm, reference image 50/2. This extends anteriorly into the perineum. More inferiorly within the buttocks are multilocular fluid collections, measuring up to 2.9 x 2.5 cm on the right and 3.8 x 2.5 cm on the left. I do not see any direct communication with the bowel lumen to suggest fistula. IMPRESSION: 1. Multilocular gas and fluid collection within the perianal region, extending from the perineum into the bilateral inferior buttocks, consistent with abscess. I do not see any communication between the gas and fluid collections and the bowel lumen. 2. Enlarged prostate. 3.  Aortic Atherosclerosis (ICD10-I70.0). Electronically Signed   By: MRanda NgoM.D.   On: 06/12/2022 20:01    EKG: I independently viewed the EKG done and my findings are as followed: EKG was not done in the ED  Assessment/Plan Present on Admission:  Perianal abscess  Essential hypertension  Mixed hyperlipidemia  Principal Problem:   Perianal abscess Active Problems:   Essential hypertension   [place]   Mixed hyperlipidemia   Hypotension   Obesity (BMI 30-39.9)   Sepsis secondary to perirectal abscess Patient was tachycardic and presents with leukocytosis and with source of infection  being suspected to be perirectal abscess Patient was started on IV Vanco and Zosyn, we shall continue with IV Zosyn Continue Tylenol as needed Patient is currently requiring pressors Neurosurgery (Dr. JArnoldo Morale was consulted and will see patient in the morning per EDP  ESRD on HD (MWF) Last dialysis was today Continue Sensipar, Velphoro Nephrologist will be consulted for maintenance dialysis  Hypotension Patient states that BP was usually low whenever he has dialysis and that sometimes, he receives IV hydration Continue Levophed at this time and when patient off this as tolerated  Atrial fibrillation on Eliquis Continue amiodarone Coreg will be held at this time due to hypotension Eliquis will be held in anticipation for possible surgical procedure  GERD Continue Protonix  Mixed hyperlipidemia Continue Lipitor  Essential hypertension Patient is currently hypotensive, so Coreg is temporarily held  Obesity (BMI 33.58)  Diet and lifestyle modification  DVT prophylaxis: SCDs  Code Status: Full code  Family Communication: Friend at bedside (all questions answered to satisfaction)  Consults: Nephrology, general surgery  Severity of Illness: The appropriate patient status for this patient is INPATIENT. Inpatient status is judged to be reasonable and necessary in order to provide the required intensity of service to ensure the patient's safety. The patient's presenting symptoms, physical exam findings, and initial radiographic and laboratory data in the context of their chronic comorbidities is felt to place them at high risk for further clinical deterioration. Furthermore, it is not anticipated that the patient will be medically stable for discharge from the hospital within 2 midnights of admission.   * I certify that at the point of admission it is my clinical judgment that the patient will require inpatient hospital care spanning beyond 2 midnights from the point of admission due  to high intensity of service, high risk for further deterioration and high frequency of surveillance required.*  Author: Bernadette Hoit, DO 06/13/2022 3:47 AM  For on call review [email or web address].

## 2022-06-12 NOTE — ED Triage Notes (Signed)
Pt with abscess to left buttock for over a month.  Pt denies any fevers or N/V.

## 2022-06-13 ENCOUNTER — Inpatient Hospital Stay (HOSPITAL_COMMUNITY): Payer: Medicare Other

## 2022-06-13 DIAGNOSIS — I4891 Unspecified atrial fibrillation: Secondary | ICD-10-CM

## 2022-06-13 DIAGNOSIS — E669 Obesity, unspecified: Secondary | ICD-10-CM | POA: Diagnosis not present

## 2022-06-13 DIAGNOSIS — A419 Sepsis, unspecified organism: Principal | ICD-10-CM

## 2022-06-13 DIAGNOSIS — K610 Anal abscess: Secondary | ICD-10-CM | POA: Diagnosis not present

## 2022-06-13 DIAGNOSIS — I959 Hypotension, unspecified: Secondary | ICD-10-CM

## 2022-06-13 DIAGNOSIS — K611 Rectal abscess: Principal | ICD-10-CM

## 2022-06-13 DIAGNOSIS — N186 End stage renal disease: Secondary | ICD-10-CM | POA: Diagnosis not present

## 2022-06-13 HISTORY — DX: Hypotension, unspecified: I95.9

## 2022-06-13 LAB — COMPREHENSIVE METABOLIC PANEL
ALT: 14 U/L (ref 0–44)
AST: 20 U/L (ref 15–41)
Albumin: 2.6 g/dL — ABNORMAL LOW (ref 3.5–5.0)
Alkaline Phosphatase: 65 U/L (ref 38–126)
Anion gap: 12 (ref 5–15)
BUN: 29 mg/dL — ABNORMAL HIGH (ref 8–23)
CO2: 30 mmol/L (ref 22–32)
Calcium: 8.3 mg/dL — ABNORMAL LOW (ref 8.9–10.3)
Chloride: 96 mmol/L — ABNORMAL LOW (ref 98–111)
Creatinine, Ser: 6.37 mg/dL — ABNORMAL HIGH (ref 0.61–1.24)
GFR, Estimated: 8 mL/min — ABNORMAL LOW (ref >60)
Glucose, Bld: 164 mg/dL — ABNORMAL HIGH (ref 70–99)
Potassium: 3.6 mmol/L (ref 3.5–5.1)
Sodium: 138 mmol/L (ref 135–145)
Total Bilirubin: 0.8 mg/dL (ref 0.3–1.2)
Total Protein: 7 g/dL (ref 6.5–8.1)

## 2022-06-13 LAB — CBC
HCT: 31.3 % — ABNORMAL LOW (ref 39.0–52.0)
Hemoglobin: 9.7 g/dL — ABNORMAL LOW (ref 13.0–17.0)
MCH: 31.1 pg (ref 26.0–34.0)
MCHC: 31 g/dL (ref 30.0–36.0)
MCV: 100.3 fL — ABNORMAL HIGH (ref 80.0–100.0)
Platelets: 199 10*3/uL (ref 150–400)
RBC: 3.12 MIL/uL — ABNORMAL LOW (ref 4.22–5.81)
RDW: 14.4 % (ref 11.5–15.5)
WBC: 17.5 10*3/uL — ABNORMAL HIGH (ref 4.0–10.5)
nRBC: 0 % (ref 0.0–0.2)

## 2022-06-13 LAB — PHOSPHORUS: Phosphorus: 4.6 mg/dL (ref 2.5–4.6)

## 2022-06-13 LAB — ECHOCARDIOGRAM COMPLETE
AR max vel: 0.98 cm2
AV Area VTI: 1.03 cm2
AV Area mean vel: 1.03 cm2
AV Mean grad: 28 mmHg
AV Peak grad: 44.6 mmHg
Ao pk vel: 3.34 m/s
Area-P 1/2: 6.83 cm2
Height: 70 in
S' Lateral: 2.5 cm
Weight: 3744 oz

## 2022-06-13 LAB — MRSA NEXT GEN BY PCR, NASAL: MRSA by PCR Next Gen: NOT DETECTED

## 2022-06-13 LAB — MAGNESIUM: Magnesium: 2 mg/dL (ref 1.7–2.4)

## 2022-06-13 MED ORDER — PANTOPRAZOLE SODIUM 40 MG PO TBEC
40.0000 mg | DELAYED_RELEASE_TABLET | Freq: Every day | ORAL | Status: DC
  Administered 2022-06-13 – 2022-06-14 (×2): 40 mg via ORAL
  Filled 2022-06-13 (×2): qty 1

## 2022-06-13 MED ORDER — SODIUM CHLORIDE 0.9 % IV SOLN
2.2500 g | Freq: Three times a day (TID) | INTRAVENOUS | Status: AC
  Administered 2022-06-13 – 2022-06-22 (×27): 2.25 g via INTRAVENOUS
  Filled 2022-06-13 (×8): qty 10
  Filled 2022-06-13: qty 2.25
  Filled 2022-06-13 (×8): qty 10
  Filled 2022-06-13: qty 2.25
  Filled 2022-06-13 (×5): qty 10
  Filled 2022-06-13: qty 2.25
  Filled 2022-06-13 (×2): qty 10
  Filled 2022-06-13 (×2): qty 2.25

## 2022-06-13 MED ORDER — ONDANSETRON HCL 4 MG/2ML IJ SOLN: 4.0000 mg | Freq: Four times a day (QID) | INTRAMUSCULAR | Status: DC | PRN

## 2022-06-13 MED ORDER — CHLORHEXIDINE GLUCONATE CLOTH 2 % EX PADS
6.0000 | MEDICATED_PAD | Freq: Every day | CUTANEOUS | Status: DC
  Administered 2022-06-13: 6 via TOPICAL

## 2022-06-13 MED ORDER — ACETAMINOPHEN 325 MG PO TABS
650.0000 mg | ORAL_TABLET | Freq: Four times a day (QID) | ORAL | Status: DC | PRN
  Administered 2022-06-13 – 2022-06-16 (×3): 650 mg via ORAL
  Filled 2022-06-13 (×3): qty 2

## 2022-06-13 MED ORDER — SODIUM CHLORIDE 0.9 % IV SOLN: 250.0000 mL | INTRAVENOUS | Status: DC

## 2022-06-13 MED ORDER — ACETAMINOPHEN 650 MG RE SUPP: 650.0000 mg | Freq: Four times a day (QID) | RECTAL | Status: DC | PRN

## 2022-06-13 MED ORDER — CINACALCET HCL 30 MG PO TABS
30.0000 mg | ORAL_TABLET | Freq: Every day | ORAL | Status: DC
  Administered 2022-06-13 – 2022-06-14 (×2): 30 mg via ORAL
  Filled 2022-06-13 (×2): qty 1

## 2022-06-13 MED ORDER — NOREPINEPHRINE 4 MG/250ML-% IV SOLN
0.0000 ug/min | INTRAVENOUS | Status: DC
  Administered 2022-06-13: 2 ug/min via INTRAVENOUS
  Administered 2022-06-14: 9 ug/min via INTRAVENOUS
  Administered 2022-06-14: 7 ug/min via INTRAVENOUS
  Administered 2022-06-14: 9 ug/min via INTRAVENOUS
  Administered 2022-06-15: 10 ug/min via INTRAVENOUS
  Administered 2022-06-15: 7 ug/min via INTRAVENOUS
  Administered 2022-06-15: 5 ug/min via INTRAVENOUS
  Administered 2022-06-16: 2 ug/min via INTRAVENOUS
  Administered 2022-06-17: 18 ug/min via INTRAVENOUS
  Administered 2022-06-17: 12 ug/min via INTRAVENOUS
  Administered 2022-06-17 (×2): 16 ug/min via INTRAVENOUS
  Administered 2022-06-18: 17 ug/min via INTRAVENOUS
  Administered 2022-06-18: 16 ug/min via INTRAVENOUS
  Administered 2022-06-18 (×2): 17 ug/min via INTRAVENOUS
  Administered 2022-06-18: 16 ug/min via INTRAVENOUS
  Administered 2022-06-18: 17 ug/min via INTRAVENOUS
  Administered 2022-06-19: 10 ug/min via INTRAVENOUS
  Administered 2022-06-20: 9 ug/min via INTRAVENOUS
  Filled 2022-06-13 (×24): qty 250

## 2022-06-13 MED ORDER — HYDROCODONE-ACETAMINOPHEN 5-325 MG PO TABS
1.0000 | ORAL_TABLET | Freq: Four times a day (QID) | ORAL | Status: DC | PRN
  Administered 2022-06-13 – 2022-06-22 (×12): 1 via ORAL
  Filled 2022-06-13 (×12): qty 1

## 2022-06-13 MED ORDER — ATORVASTATIN CALCIUM 40 MG PO TABS
40.0000 mg | ORAL_TABLET | Freq: Every day | ORAL | Status: DC
  Administered 2022-06-13 – 2022-06-14 (×2): 40 mg via ORAL
  Filled 2022-06-13 (×2): qty 1

## 2022-06-13 MED ORDER — ONDANSETRON HCL 4 MG PO TABS: 4.0000 mg | ORAL_TABLET | Freq: Four times a day (QID) | ORAL | Status: DC | PRN

## 2022-06-13 MED ORDER — SUCROFERRIC OXYHYDROXIDE 500 MG PO CHEW
500.0000 mg | CHEWABLE_TABLET | Freq: Two times a day (BID) | ORAL | Status: DC
  Administered 2022-06-13 – 2022-06-23 (×13): 500 mg via ORAL
  Filled 2022-06-13 (×24): qty 1

## 2022-06-13 MED ORDER — SODIUM CHLORIDE 0.9 % IV BOLUS: 250.0000 mL | Freq: Once | INTRAVENOUS | Status: DC

## 2022-06-13 MED ORDER — PERFLUTREN LIPID MICROSPHERE
1.0000 mL | INTRAVENOUS | Status: AC | PRN
  Administered 2022-06-13: 4 mL via INTRAVENOUS

## 2022-06-13 MED ORDER — AMIODARONE HCL 200 MG PO TABS
200.0000 mg | ORAL_TABLET | Freq: Every day | ORAL | Status: DC
  Administered 2022-06-13 – 2022-06-14 (×2): 200 mg via ORAL
  Filled 2022-06-13 (×2): qty 1

## 2022-06-13 NOTE — Consult Note (Signed)
Reason for Consult: Perirectal abscess Referring Physician: Dr. London Pepper is an 76 y.o. male.  HPI: Patient is a 76 year old black male with multiple medical problems including end-stage renal disease requiring dialysis, atrial fibrillation on Eliquis, and hypertension who states he has had a bump in the perianal region for approximately 1 month.  Due to worsening pain when he was sitting, he presented to the emergency room.  CT scan of the abdomen revealed a perirectal abscess, primarily on the right.  No communication to the rectum was noted.  He was noted to have leukocytosis.  He was admitted to the hospital for further evaluation and treatment.  He last took his Eliquis yesterday.  He is still having pain in the perirectal region and lies on his left side to avoid pain.  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 ([place]) 12/07/2019   Dialysis patient [facility])    Gout    Hypertension    Right posterior capsular opacification 02/27/2021   Vitreous hemorrhage of right eye ([place]) 10/24/2019    Past Surgical History:  Procedure Laterality Date   A/V FISTULAGRAM Left 08/17/2017   Procedure: A/V FISTULAGRAM;  Surgeon: Katha Cabal, MD;  Location: [place] [facility];  Service: Cardiovascular;  Laterality: Left;   A/V SHUNT INTERVENTION N/A 08/17/2017   Procedure: A/V SHUNT INTERVENTION;  Surgeon: Katha Cabal, MD;  Location: [place] [facility];  Service: Cardiovascular;  Laterality: N/A;   AV FISTULA PLACEMENT Left 05/31/2015   Procedure: ARTERIOVENOUS (AV) FISTULA CREATION;  Surgeon: Katha Cabal, MD;  Location: [facility];  Service: Vascular;  Laterality: Left;   excision bone spurs Bilateral 1989   feet    KNEE SURGERY Left 1998   arthroscopy   SHOULDER SURGERY Left 1994   rotator cuff    Family History  Problem Relation Age of Onset   Kidney disease Mother    Alcoholism Father    Alcoholism Brother     Social History:  reports that he quit smoking about 43 years ago. His smoking use included cigarettes. He has never used smokeless tobacco. He reports that he does not drink alcohol and does not use drugs.  Allergies: No Known Allergies  Medications: I have reviewed the patient's current medications. Prior to Admission:  Medications Prior to Admission  Medication Sig Dispense Refill Last Dose   allopurinol (ZYLOPRIM) 100 MG tablet Take 1 tablet every day by oral route.      allopurinol (ZYLOPRIM) 300 MG tablet Take 300 mg by mouth daily. (Patient not taking: Reported on 05/12/2022)      amiodarone (PACERONE) 200 MG tablet Take 200 mg by mouth daily.      atorvastatin (LIPITOR) 40 MG tablet Take 40 mg by mouth daily.       carvedilol (COREG) 25 MG tablet Take by mouth.      cinacalcet (SENSIPAR) 30 MG tablet SMARTSIG:1 Tablet(s) By Mouth Every Evening (Patient not taking: Reported on 05/12/2022)      Doxercalciferol (HECTOROL IV) Doxercalciferol (Hectorol)      ELIQUIS 5 MG TABS tablet Take 5 mg by mouth 2 (two) times daily.      HYDROcodone-acetaminophen (NORCO/VICODIN) 5-325 MG  tablet Take 1 tablet by mouth every 6 (six) hours as needed for severe pain. 10 tablet 0    LOKELMA 10 g PACK packet Take 10 g by mouth 2 (two) times a week.      Methoxy PEG-Epoetin Beta (MIRCERA IJ) Mircera      omeprazole (PRILOSEC) 20 MG capsule Take 20 mg by mouth every morning.       VELPHORO 500 MG chewable tablet Chew 500 mg by mouth 2 (two) times daily.       Results for orders placed or performed during the hospital encounter of 06/12/22 (from the past 48 hour(s))  CBC with Differential     Status: Abnormal   Collection Time: 06/12/22  6:32 PM  Result Value Ref Range   WBC 17.2 (H) 4.0 - 10.5  K/uL   RBC 3.42 (L) 4.22 - 5.81 MIL/uL   Hemoglobin 10.8 (L) 13.0 - 17.0 g/dL   HCT 34.4 (L) 39.0 - 52.0 %   MCV 100.6 (H) 80.0 - 100.0 fL   MCH 31.6 26.0 - 34.0 pg   MCHC 31.4 30.0 - 36.0 g/dL   RDW 14.4 11.5 - 15.5 %   Platelets 209 150 - 400 K/uL   nRBC 0.0 0.0 - 0.2 %   Neutrophils Relative % 85 %   Neutro Abs 14.7 (H) 1.7 - 7.7 K/uL   Lymphocytes Relative 7 %   Lymphs Abs 1.2 0.7 - 4.0 K/uL   Monocytes Relative 7 %   Monocytes Absolute 1.2 (H) 0.1 - 1.0 K/uL   Eosinophils Relative 0 %   Eosinophils Absolute 0.0 0.0 - 0.5 K/uL   Basophils Relative 0 %   Basophils Absolute 0.0 0.0 - 0.1 K/uL   Immature Granulocytes 1 %   Abs Immature Granulocytes 0.13 (H) 0.00 - 0.07 K/uL    Comment: Performed at [facility], [address], [place], [place] [address]  Basic metabolic panel     Status: Abnormal   Collection Time: 06/12/22  6:32 PM  Result Value Ref Range   Sodium 140 135 - 145 mmol/L   Potassium 3.5 3.5 - 5.1 mmol/L   Chloride 95 (L) 98 - 111 mmol/L   CO2 32 22 - 32 mmol/L   Glucose, Bld 154 (H) 70 - 99 mg/dL    Comment: Glucose reference range applies only to samples taken after fasting for at least 8 hours.   BUN 21 8 - 23 mg/dL   Creatinine, Ser 5.32 (H) 0.61 - 1.24 mg/dL   Calcium 8.6 (L) 8.9 - 10.3 mg/dL   GFR, Estimated 10 (L) >60 mL/min    Comment: (NOTE) Calculated using the CKD-EPI Creatinine Equation (2021)    Anion gap 13 5 - 15    Comment: Performed at [facility], [address] [place], [place] [address]  Comprehensive metabolic panel     Status: Abnormal   Collection Time: 06/13/22  4:51 AM  Result Value Ref Range   Sodium 138 135 - 145 mmol/L   Potassium 3.6 3.5 - 5.1 mmol/L   Chloride 96 (L) 98 - 111 mmol/L   CO2 30 22 - 32 mmol/L   Glucose, Bld 164 (H) 70 - 99 mg/dL    Comment: Glucose reference range applies only to samples taken after fasting for at least 8 hours.   BUN 29 (H) 8 - 23 mg/dL   Creatinine, Ser 6.37 (H) 0.61 - 1.24 mg/dL    Calcium 8.3 (L) 8.9 - 10.3 mg/dL   Total Protein  7.0 6.5 - 8.1 g/dL   Albumin 2.6 (L) 3.5 - 5.0 g/dL   AST 20 15 - 41 U/L   ALT 14 0 - 44 U/L   Alkaline Phosphatase 65 38 - 126 U/L   Total Bilirubin 0.8 0.3 - 1.2 mg/dL   GFR, Estimated 8 (L) >60 mL/min    Comment: (NOTE) Calculated using the CKD-EPI Creatinine Equation (2021)    Anion gap 12 5 - 15    Comment: Performed at [facility], [address] [place], [place] [address]  CBC     Status: Abnormal   Collection Time: 06/13/22  4:51 AM  Result Value Ref Range   WBC 17.5 (H) 4.0 - 10.5 K/uL   RBC 3.12 (L) 4.22 - 5.81 MIL/uL   Hemoglobin 9.7 (L) 13.0 - 17.0 g/dL   HCT 31.3 (L) 39.0 - 52.0 %   MCV 100.3 (H) 80.0 - 100.0 fL   MCH 31.1 26.0 - 34.0 pg   MCHC 31.0 30.0 - 36.0 g/dL   RDW 14.4 11.5 - 15.5 %   Platelets 199 150 - 400 K/uL   nRBC 0.0 0.0 - 0.2 %    Comment: Performed at [facility], [address] [place], [place] [address]  Magnesium     Status: None   Collection Time: 06/13/22  4:51 AM  Result Value Ref Range   Magnesium 2.0 1.7 - 2.4 mg/dL    Comment: Performed at [facility], [address] [place], [place] [address]  Phosphorus     Status: None   Collection Time: 06/13/22  4:51 AM  Result Value Ref Range   Phosphorus 4.6 2.5 - 4.6 mg/dL    Comment: Performed at [facility], [address] [place], [place] [address]    CT PELVIS W CONTRAST  Result Date: 06/12/2022 CLINICAL DATA:  Left buttock abscess for 1 month EXAM: CT PELVIS WITH CONTRAST TECHNIQUE: Multidetector CT imaging of the pelvis was performed using the standard protocol following the bolus administration of intravenous contrast. RADIATION DOSE REDUCTION: This exam was performed according to the departmental dose-optimization program which includes automated exposure control, adjustment of the mA and/or kV according to patient size and/or use of iterative reconstruction technique. CONTRAST:  123m OMNIPAQUE IOHEXOL 300 MG/ML  SOLN COMPARISON:   05/05/2022, 05/27/2020 FINDINGS: Urinary Tract: Distal ureters are unremarkable. Bladder is decompressed, limiting its evaluation. Bowel: No bowel obstruction or ileus. Normal appendix right lower quadrant. No bowel wall thickening or inflammatory change. Vascular/Lymphatic: Atherosclerosis of the aorta and its distal branches. No pathologically enlarged lymph nodes within the pelvis. Reproductive: Prostate is enlarged, measuring 5.5 x 4.3 cm. Fiduciary markers are seen within the prostate. Other: There is no free fluid or free intraperitoneal gas. Fat containing umbilical hernia is noted. No bowel herniation. Musculoskeletal: Multilocular gas and fluid collections are seen within the buttocks, consistent with perianal abscesses. The largest gas and fluid collection within the right gluteal region measures 6.3 x 2.7 cm, reference image 50/2. This extends anteriorly into the perineum. More inferiorly within the buttocks are multilocular fluid collections, measuring up to 2.9 x 2.5 cm on the right and 3.8 x 2.5 cm on the left. I do not see any direct communication with the bowel lumen to suggest fistula. IMPRESSION: 1. Multilocular gas and fluid collection within the perianal region, extending from the perineum into the bilateral inferior buttocks, consistent with abscess. I do not see any communication between the gas and fluid collections and the bowel lumen. 2. Enlarged prostate.  3.  Aortic Atherosclerosis (ICD10-I70.0). Electronically Signed   By: Randa Ngo M.D.   On: 06/12/2022 20:01    ROS:  Pertinent items are noted in HPI.  Blood pressure (!) 102/58, pulse (!) 108, temperature 99.1 F (37.3 C), temperature source Axillary, resp. rate (!) 23, height '5\' 10"'$  (1.778 m), weight 106.1 kg, SpO2 100 %. Physical Exam: Pleasant black male no acute distress Head is normocephalic, atraumatic Heart examination reveals an irregular rate and rhythm. Rectal examination reveals a fluctuant area along the right  perianal region.  Examination was limited secondary to pain.  No drainage was noted.  CT scan images personally reviewed  Assessment/Plan: Impression: Perirectal abscess, anticoagulation on Eliquis, atrial fibrillation, end-stage renal disease requiring dialysis Plan: We will not be able to incise and drain the perirectal abscess until Monday due to the use of Eliquis.  Agree with Zosyn and vancomycin.  Discussed with Dr. Carles Collet.  Aviva Signs 06/13/2022, 9:35 AM

## 2022-06-13 NOTE — Progress Notes (Signed)
  Transition of Care [facility]) Screening Note   Patient Details  Name: Justin [name] Date of Birth: 1946-03-09   Transition of Care [facility]) CM/SW Contact:    Iona Beard, [place] Phone Number: 06/13/2022, 12:41 PM    Transition of Care Department [facility]) has reviewed patient and no TOC needs have been identified at this time. We will continue to monitor patient advancement through interdisciplinary progression rounds. If new patient transition needs arise, please place a TOC consult.

## 2022-06-13 NOTE — Progress Notes (Signed)
PROGRESS NOTE  EDER MACEK ACZ:[id number] DOB: 04-29-1946 DOA: 06/12/2022 PCP: Michell Heinrich, DO  Brief History:  76 year old male with a history of ESRD (MWF) atrial fibrillation/atrial flutter, hypertension, hyperlipidemia, prediabetes, tobacco abuse in remission presenting with 1 month history of buttock pain, right greater than left.  Patient stated that it started as a boil that subsequently burst.  After testing, he began noticing worsening pain in his perianal region with sitting.  It has progressively gotten worse.  He denies any fevers, chills, chest pain, shortness of breath, coughing, hemoptysis, nausea, vomiting, direct abdominal pain.  He has not been on any recent antibiotics.  Because of his worsening pain, the patient presented for further evaluation and treatment.Marland Kitchen He last had dialysis on 06/12/2022.  In the ED, the patient had a low-grade temperature 100.2 F.  He was initially hypotensive with a blood pressure of 86/43.  WBC 17.2, hemoglobin 10.8, platelets 199,000.  Sodium 140, potassium 3.5, bicarbonate 32, serum creatinine 5.32.  CT of the abdomen and pelvis showed a multiloculated/fluid collection and gas collection in the perianal region from the perineum to the inferior buttocks bilaterally.  The patient was started on vancomycin and Zosyn.  General surgery was consulted to assist with management.  The patient's apixaban was held in preparation for surgery.  Assessment/Plan: Severe sepsis -Secondary to perirectal abscess -Presented with tachycardia, leukocytosis, and lactate 2.7 -Initially required peripheral Levophed for short period of time -Now weaned off Levophed -Continue vancomycin and Zosyn  Perirectal abscess -General surgery consulted -Case discussed with Dr. Aviva Signs -Holding apixaban -Echocardiogram in preparation for surgery -Tentatively planning for I&D on 06/15/2022  ESRD -He dialyzes on Monday Wednesday Friday -Last dialysis  06/12/2022 -Nephrology consulted for next dialysis -Continue Velphoro and Sensipar  Atrial flutter -Holding apixaban in preparation for surgery -Continue amiodarone  Mixed hyperlipidemia -Continue statin  Essential hypertension -Holding metoprolol secondary to hypotension  Obesity -BMI 33.58 -lifestyle modification     Family Communication: no  Family at bedside  Consultants:  renal, general surgery  Code Status:  FULL   DVT Prophylaxis:  apixaban on hold   Procedures: As Listed in Progress Note Above  Antibiotics: Vanc 12/1>> Zosyn 12/1>>        Subjective: Patient complains of perianal pain.  He denies any fevers, chills, chest pain, shortness breath, nausea, vomiting, diarrhea, abdominal pain.  Objective: Vitals:   06/13/22 0600 06/13/22 0615 06/13/22 0700 06/13/22 0800  BP: (!) 109/55 108/61  (!) 102/58  Pulse:      Resp: (!) 22 (!) 23  (!) 23  Temp:   99.1 F (37.3 C)   TempSrc:   Axillary   SpO2:  100%    Weight:      Height:        Intake/Output Summary (Last 24 hours) at 06/13/2022 1054 Last data filed at 06/13/2022 0800 Gross per 24 hour  Intake 636.61 ml  Output --  Net 636.61 ml   Weight change:  Exam:  General:  Pt is alert, follows commands appropriately, not in acute distress HEENT: No icterus, No thrush, No neck mass, [place]/AT Cardiovascular: RRR, S1/S2, no rubs, no gallops Respiratory: CTA bilaterally, no wheezing, no crackles, no rhonchi Abdomen: Soft/+BS, non tender, non distended, no guarding Extremities: No edema, No lymphangitis, No petechiae, No rashes, no  Synovitis Perineum: pt deferred exam   Data Reviewed: I have personally reviewed following labs and imaging studies Basic Metabolic Panel: Recent Labs  Lab 06/12/22 1832 06/13/22 0451  NA 140 138  K 3.5 3.6  CL 95* 96*  CO2 32 30  GLUCOSE 154* 164*  BUN 21 29*  CREATININE 5.32* 6.37*  CALCIUM 8.6* 8.3*  MG  --  2.0  PHOS  --  4.6   Liver Function  Tests: Recent Labs  Lab 06/13/22 0451  AST 20  ALT 14  ALKPHOS 65  BILITOT 0.8  PROT 7.0  ALBUMIN 2.6*   No results for input(s): "LIPASE", "AMYLASE" in the last 168 hours. No results for input(s): "AMMONIA" in the last 168 hours. Coagulation Profile: No results for input(s): "INR", "PROTIME" in the last 168 hours. CBC: Recent Labs  Lab 06/12/22 1832 06/13/22 0451  WBC 17.2* 17.5*  NEUTROABS 14.7*  --   HGB 10.8* 9.7*  HCT 34.4* 31.3*  MCV 100.6* 100.3*  PLT 209 199   Cardiac Enzymes: No results for input(s): "CKTOTAL", "CKMB", "CKMBINDEX", "TROPONINI" in the last 168 hours. BNP: Invalid input(s): "POCBNP" CBG: No results for input(s): "GLUCAP" in the last 168 hours. HbA1C: No results for input(s): "HGBA1C" in the last 72 hours. Urine analysis:    Component Value Date/Time   COLORURINE AMBER (A) 05/27/2020 1451   APPEARANCEUR CLOUDY (A) 05/27/2020 1451   LABSPEC 1.017 05/27/2020 1451   PHURINE 6.0 05/27/2020 1451   GLUCOSEU 50 (A) 05/27/2020 1451   HGBUR LARGE (A) 05/27/2020 1451   BILIRUBINUR NEGATIVE 05/27/2020 1451   KETONESUR NEGATIVE 05/27/2020 1451   PROTEINUR 100 (A) 05/27/2020 1451   UROBILINOGEN 0.2 08/02/2010 1534   NITRITE NEGATIVE 05/27/2020 1451   LEUKOCYTESUR SMALL (A) 05/27/2020 1451   Sepsis Labs: '@LABRCNTIP'$ (procalcitonin:4,lacticidven:4) ) Recent Results (from the past 240 hour(s))  MRSA Next Gen by PCR, Nasal     Status: None   Collection Time: 06/13/22  1:30 AM   Specimen: Nasal Mucosa; Nasal Swab  Result Value Ref Range Status   MRSA by PCR Next Gen NOT DETECTED NOT DETECTED Final    Comment: (NOTE) The GeneXpert MRSA Assay (FDA approved for NASAL specimens only), is one component of a comprehensive MRSA colonization surveillance program. It is not intended to diagnose MRSA infection nor to guide or monitor treatment for MRSA infections. Test performance is not FDA approved in patients less than 29 years old. Performed at [facility], [address] [place], [place] [address]      Scheduled Meds:  amiodarone  200 mg Oral Daily   atorvastatin  40 mg Oral Daily   Chlorhexidine Gluconate Cloth  6 each Topical Daily   cinacalcet  30 mg Oral Q supper   pantoprazole  40 mg Oral Daily   sucroferric oxyhydroxide  500 mg Oral BID WC   Continuous Infusions:  sodium chloride     norepinephrine (LEVOPHED) Adult infusion Stopped (06/13/22 0203)   piperacillin-tazobactam (ZOSYN) 2.25 g in sodium chloride 0.9 % 50 mL IVPB      Procedures/Studies: CT PELVIS W CONTRAST  Result Date: 06/12/2022 CLINICAL DATA:  Left buttock abscess for 1 month EXAM: CT PELVIS WITH CONTRAST TECHNIQUE: Multidetector CT imaging of the pelvis was performed using the standard protocol following the bolus administration of intravenous contrast. RADIATION DOSE REDUCTION: This exam was performed according to the departmental dose-optimization program which includes automated exposure control, adjustment of the mA and/or kV according to patient size and/or use of iterative reconstruction technique. CONTRAST:  134m OMNIPAQUE IOHEXOL 300 MG/ML  SOLN COMPARISON:  05/05/2022, 05/27/2020 FINDINGS: Urinary Tract: Distal ureters are unremarkable. Bladder is  decompressed, limiting its evaluation. Bowel: No bowel obstruction or ileus. Normal appendix right lower quadrant. No bowel wall thickening or inflammatory change. Vascular/Lymphatic: Atherosclerosis of the aorta and its distal branches. No pathologically enlarged lymph nodes within the pelvis. Reproductive: Prostate is enlarged, measuring 5.5 x 4.3 cm. Fiduciary markers are seen within the prostate. Other: There is no free fluid or free intraperitoneal gas. Fat containing umbilical hernia is noted. No bowel herniation. Musculoskeletal: Multilocular gas and fluid collections are seen within the buttocks, consistent with perianal abscesses. The largest gas and fluid collection within the right gluteal region  measures 6.3 x 2.7 cm, reference image 50/2. This extends anteriorly into the perineum. More inferiorly within the buttocks are multilocular fluid collections, measuring up to 2.9 x 2.5 cm on the right and 3.8 x 2.5 cm on the left. I do not see any direct communication with the bowel lumen to suggest fistula. IMPRESSION: 1. Multilocular gas and fluid collection within the perianal region, extending from the perineum into the bilateral inferior buttocks, consistent with abscess. I do not see any communication between the gas and fluid collections and the bowel lumen. 2. Enlarged prostate. 3.  Aortic Atherosclerosis (ICD10-I70.0). Electronically Signed   By: Randa Ngo M.D.   On: 06/12/2022 20:01    Orson Eva, DO  [facility]  If 7PM-7AM, please contact night-coverage [email or web address] Password [email or web address] 06/13/2022, 10:54 AM   LOS: 1 day

## 2022-06-14 DIAGNOSIS — N186 End stage renal disease: Secondary | ICD-10-CM | POA: Diagnosis not present

## 2022-06-14 DIAGNOSIS — E669 Obesity, unspecified: Secondary | ICD-10-CM | POA: Diagnosis not present

## 2022-06-14 DIAGNOSIS — A419 Sepsis, unspecified organism: Secondary | ICD-10-CM | POA: Diagnosis not present

## 2022-06-14 DIAGNOSIS — K610 Anal abscess: Secondary | ICD-10-CM | POA: Diagnosis not present

## 2022-06-14 LAB — RENAL FUNCTION PANEL
Albumin: 2.5 g/dL — ABNORMAL LOW (ref 3.5–5.0)
Anion gap: 14 (ref 5–15)
BUN: 42 mg/dL — ABNORMAL HIGH (ref 8–23)
CO2: 27 mmol/L (ref 22–32)
Calcium: 7.9 mg/dL — ABNORMAL LOW (ref 8.9–10.3)
Chloride: 93 mmol/L — ABNORMAL LOW (ref 98–111)
Creatinine, Ser: 8.39 mg/dL — ABNORMAL HIGH (ref 0.61–1.24)
GFR, Estimated: 6 mL/min — ABNORMAL LOW (ref >60)
Glucose, Bld: 289 mg/dL — ABNORMAL HIGH (ref 70–99)
Phosphorus: 3.8 mg/dL (ref 2.5–4.6)
Potassium: 3.8 mmol/L (ref 3.5–5.1)
Sodium: 134 mmol/L — ABNORMAL LOW (ref 135–145)

## 2022-06-14 LAB — CBC
HCT: 31.1 % — ABNORMAL LOW (ref 39.0–52.0)
Hemoglobin: 9.8 g/dL — ABNORMAL LOW (ref 13.0–17.0)
MCH: 31 pg (ref 26.0–34.0)
MCHC: 31.5 g/dL (ref 30.0–36.0)
MCV: 98.4 fL (ref 80.0–100.0)
Platelets: 231 10*3/uL (ref 150–400)
RBC: 3.16 MIL/uL — ABNORMAL LOW (ref 4.22–5.81)
RDW: 14.3 % (ref 11.5–15.5)
WBC: 24.3 10*3/uL — ABNORMAL HIGH (ref 4.0–10.5)
nRBC: 0 % (ref 0.0–0.2)

## 2022-06-14 MED ORDER — CHLORHEXIDINE GLUCONATE CLOTH 2 % EX PADS: 6.0000 | MEDICATED_PAD | Freq: Once | CUTANEOUS | Status: AC

## 2022-06-14 MED ORDER — SODIUM CHLORIDE 0.9 % IV SOLN: INTRAVENOUS | Status: DC | PRN

## 2022-06-14 MED ORDER — SODIUM CHLORIDE 0.9% FLUSH
10.0000 mL | Freq: Two times a day (BID) | INTRAVENOUS | Status: DC
  Administered 2022-06-14 – 2022-06-17 (×7): 10 mL
  Administered 2022-06-18: 30 mL
  Administered 2022-06-18 – 2022-06-26 (×16): 10 mL

## 2022-06-14 MED ORDER — MIDODRINE HCL 5 MG PO TABS
5.0000 mg | ORAL_TABLET | Freq: Three times a day (TID) | ORAL | Status: DC
  Administered 2022-06-14 (×2): 5 mg via ORAL
  Filled 2022-06-14 (×2): qty 1

## 2022-06-14 MED ORDER — SODIUM CHLORIDE 0.9% FLUSH: 10.0000 mL | INTRAVENOUS | Status: DC | PRN

## 2022-06-14 MED ORDER — CHLORHEXIDINE GLUCONATE CLOTH 2 % EX PADS
6.0000 | MEDICATED_PAD | Freq: Every day | CUTANEOUS | Status: DC
  Administered 2022-06-16 – 2022-06-17 (×2): 6 via TOPICAL

## 2022-06-14 MED ORDER — MIDODRINE HCL 5 MG PO TABS: 5.0000 mg | ORAL_TABLET | Freq: Three times a day (TID) | ORAL | Status: DC

## 2022-06-14 MED ORDER — ALBUMIN HUMAN 25 % IV SOLN
25.0000 g | Freq: Once | INTRAVENOUS | Status: AC
  Administered 2022-06-14: 25 g via INTRAVENOUS
  Filled 2022-06-14: qty 100

## 2022-06-14 MED ORDER — CHLORHEXIDINE GLUCONATE CLOTH 2 % EX PADS
6.0000 | MEDICATED_PAD | Freq: Once | CUTANEOUS | Status: AC
  Administered 2022-06-15: 6 via TOPICAL

## 2022-06-14 MED ORDER — CHLORHEXIDINE GLUCONATE CLOTH 2 % EX PADS
6.0000 | MEDICATED_PAD | Freq: Every day | CUTANEOUS | Status: DC
  Administered 2022-06-15 – 2022-06-26 (×13): 6 via TOPICAL

## 2022-06-14 NOTE — Progress Notes (Signed)
PROGRESS NOTE  Justin [name] MAU:[id number] DOB: Nov 06, 1945 DOA: 06/12/2022 PCP: Michell Heinrich, DO  Brief History:  76 year old male with a history of ESRD (MWF) atrial fibrillation/atrial flutter, hypertension, hyperlipidemia, prediabetes, tobacco abuse in remission presenting with 1 month history of buttock pain, right greater than left.  Patient stated that it started as a boil that subsequently burst.  After testing, he began noticing worsening pain in his perianal region with sitting.  It has progressively gotten worse.  He denies any fevers, chills, chest pain, shortness of breath, coughing, hemoptysis, nausea, vomiting, direct abdominal pain.  He has not been on any recent antibiotics.  Because of his worsening pain, the patient presented for further evaluation and treatment.Marland Kitchen He last had dialysis on 06/12/2022.  In the ED, the patient had a low-grade temperature 100.2 F.  He was initially hypotensive with a blood pressure of 86/43.  WBC 17.2, hemoglobin 10.8, platelets 199,000.  Sodium 140, potassium 3.5, bicarbonate 32, serum creatinine 5.32.  CT of the abdomen and pelvis showed a multiloculated/fluid collection and gas collection in the perianal region from the perineum to the inferior buttocks bilaterally.  The patient was started on vancomycin and Zosyn.  General surgery was consulted to assist with management.  The patient's apixaban was held in preparation for surgery.   Assessment/Plan: Severe sepsis -Secondary to perirectal abscess -Presented with tachycardia, leukocytosis, and lactate 2.7 -Initially required peripheral Levophed for short period of time -06/14/22 arterial line inserted -06/14/22  CVL inserted -levophed 8-9 mcg/kg/min -Continue vancomycin and Zosyn -start low dose midodrine   Perirectal abscess -General surgery consulted -Case discussed with Dr. Aviva Signs 12/3 -Holding apixaban -Echocardiogram EF 60-65%, low normal RVF, mod TR -planning for I&D on  06/15/2022   ESRD -He dialyzes on Monday Wednesday Friday -Last dialysis 06/12/2022 -Nephrology consulted for next dialysis -Continue Velphoro and Sensipar   Atrial flutter -Holding apixaban in preparation for surgery -Continue amiodarone   Mixed hyperlipidemia -Continue statin   Essential hypertension -Holding metoprolol secondary to hypotension   Obesity -BMI 33.58 -lifestyle modification         Family Communication: no  Family at bedside   Consultants:  renal, general surgery   Code Status:  FULL    DVT Prophylaxis:  apixaban on hold     Procedures: As Listed in Progress Note Above   Antibiotics: Vanc 12/1>> Zosyn 12/1>>          Subjective: Patient denies fevers, chills, headache, chest pain, dyspnea, nausea, vomiting, diarrhea, abdominal pain, dysuria, hematuria, hematochezia, and melena.   Objective: Vitals:   06/14/22 0915 06/14/22 0930 06/14/22 1000 06/14/22 1200  BP:   (!) 106/47 (!) 84/44  Pulse:      Resp: (!) 29 (!) 25    Temp:      TempSrc:      SpO2:   90% 97%  Weight:      Height:        Intake/Output Summary (Last 24 hours) at 06/14/2022 1408 Last data filed at 06/14/2022 1200 Gross per 24 hour  Intake 996.13 ml  Output --  Net 996.13 ml   Weight change:  Exam:  General:  Pt is alert, follows commands appropriately, not in acute distress HEENT: No icterus, No thrush, No neck mass, [place]/AT Cardiovascular: IRRR, S1/S2, no rubs, no gallops Respiratory: bibasilar crackles. No wheeze Abdomen: Soft/+BS, non tender, non distended, no guarding Extremities: No edema, No lymphangitis, No petechiae, No rashes, no synovitis  Data Reviewed: I have personally reviewed following labs and imaging studies Basic Metabolic Panel: Recent Labs  Lab 06/12/22 1832 06/13/22 0451 06/14/22 0313  NA 140 138 134*  K 3.5 3.6 3.8  CL 95* 96* 93*  CO2 32 30 27  GLUCOSE 154* 164* 289*  BUN 21 29* 42*  CREATININE 5.32* 6.37* 8.39*  CALCIUM  8.6* 8.3* 7.9*  MG  --  2.0  --   PHOS  --  4.6 3.8   Liver Function Tests: Recent Labs  Lab 06/13/22 0451 06/14/22 0313  AST 20  --   ALT 14  --   ALKPHOS 65  --   BILITOT 0.8  --   PROT 7.0  --   ALBUMIN 2.6* 2.5*   No results for input(s): "LIPASE", "AMYLASE" in the last 168 hours. No results for input(s): "AMMONIA" in the last 168 hours. Coagulation Profile: No results for input(s): "INR", "PROTIME" in the last 168 hours. CBC: Recent Labs  Lab 06/12/22 1832 06/13/22 0451 06/14/22 0313  WBC 17.2* 17.5* 24.3*  NEUTROABS 14.7*  --   --   HGB 10.8* 9.7* 9.8*  HCT 34.4* 31.3* 31.1*  MCV 100.6* 100.3* 98.4  PLT 209 199 231   Cardiac Enzymes: No results for input(s): "CKTOTAL", "CKMB", "CKMBINDEX", "TROPONINI" in the last 168 hours. BNP: Invalid input(s): "POCBNP" CBG: No results for input(s): "GLUCAP" in the last 168 hours. HbA1C: No results for input(s): "HGBA1C" in the last 72 hours. Urine analysis:    Component Value Date/Time   COLORURINE AMBER (A) 05/27/2020 1451   APPEARANCEUR CLOUDY (A) 05/27/2020 1451   LABSPEC 1.017 05/27/2020 1451   PHURINE 6.0 05/27/2020 1451   GLUCOSEU 50 (A) 05/27/2020 1451   HGBUR LARGE (A) 05/27/2020 1451   BILIRUBINUR NEGATIVE 05/27/2020 1451   KETONESUR NEGATIVE 05/27/2020 1451   PROTEINUR 100 (A) 05/27/2020 1451   UROBILINOGEN 0.2 08/02/2010 1534   NITRITE NEGATIVE 05/27/2020 1451   LEUKOCYTESUR SMALL (A) 05/27/2020 1451   Sepsis Labs: '@LABRCNTIP'$ (procalcitonin:4,lacticidven:4) ) Recent Results (from the past 240 hour(s))  MRSA Next Gen by PCR, Nasal     Status: None   Collection Time: 06/13/22  1:30 AM   Specimen: Nasal Mucosa; Nasal Swab  Result Value Ref Range Status   MRSA by PCR Next Gen NOT DETECTED NOT DETECTED Final    Comment: (NOTE) The GeneXpert MRSA Assay (FDA approved for NASAL specimens only), is one component of a comprehensive MRSA colonization surveillance program. It is not intended to diagnose MRSA  infection nor to guide or monitor treatment for MRSA infections. Test performance is not FDA approved in patients less than 13 years old. Performed at [facility], [address], [place], [place] [address]      Scheduled Meds:  amiodarone  200 mg Oral Daily   atorvastatin  40 mg Oral Daily   Chlorhexidine Gluconate Cloth  6 each Topical Daily   cinacalcet  30 mg Oral Q supper   midodrine  5 mg Oral Q8H   pantoprazole  40 mg Oral Daily   sucroferric oxyhydroxide  500 mg Oral BID WC   Continuous Infusions:  sodium chloride     sodium chloride     norepinephrine (LEVOPHED) Adult infusion 9 mcg/min (06/14/22 1358)   piperacillin-tazobactam (ZOSYN) 2.25 g in sodium chloride 0.9 % 50 mL IVPB Stopped (06/14/22 0554)    Procedures/Studies: ECHOCARDIOGRAM COMPLETE  Result Date: 06/13/2022    ECHOCARDIOGRAM REPORT   Patient Name:   Vinetta Bergamo Date of Exam:  06/13/2022 Medical Rec #:  [id number]      Height:       70.0 in Accession #:    [id number]     Weight:       234.0 lb Date of Birth:  08-31-1945      BSA:          2.231 m Patient Age:    57 years       BP:           98/51 mmHg Patient Gender: M              HR:           109 bpm. Exam Location:  Inpatient Procedure: 2D Echo, Color Doppler, Cardiac Doppler and Intracardiac            Opacification Agent Indications:    Atrial Fibrillation I48.91  History:        Patient has prior history of Echocardiogram examinations, most                 recent 01/30/2020. Aortic Valve Disease, Arrythmias:Atrial                 Fibrillation; Risk Factors:Hypertension and Dyslipidemia. End                 stage renal disease. Severe sepsis.  Sonographer:    Darlina Sicilian RDCS Referring Phys: [phone] [facility]  Sonographer Comments: Suboptimal apical window, suboptimal subcostal window, suboptimal parasternal window and Technically difficult study due to poor echo windows. IMPRESSIONS  1. Left ventricular ejection fraction, by estimation, is 60 to 65%. The left  ventricle has normal function. Left ventricular diastolic parameters are indeterminate.  2. Right ventricular systolic function is low normal. The right ventricular size is mildly enlarged. There is moderately elevated pulmonary artery systolic pressure.  3. The mitral valve is grossly normal. No evidence of mitral valve regurgitation. No evidence of mitral stenosis.  4. Tricuspid valve regurgitation is moderate.  5. Poor acoustic windows limit study Assessment of AV also limited by atrial fibrillation     AV is thickened, calcified Peak and mean gradients through the valve are 39 and 21 mm Hg Dimensionless index is 0.33     Overall ocnsistent with moderate AS. COmpared to echo report from 2021, mean gradient is increaed. The aortic valve is calcified. There is mild calcification of the aortic valve. Aortic valve regurgitation is not visualized. Moderate aortic valve stenosis. FINDINGS  Left Ventricle: Left ventricular ejection fraction, by estimation, is 60 to 65%. The left ventricle has normal function. Definity contrast agent was given IV to delineate the left ventricular endocardial borders. The left ventricular internal cavity size was normal in size. There is no left ventricular hypertrophy. Left ventricular diastolic parameters are indeterminate. Right Ventricle: The right ventricular size is mildly enlarged. No increase in right ventricular wall thickness. Right ventricular systolic function is low normal. There is moderately elevated pulmonary artery systolic pressure. The tricuspid regurgitant  velocity is 3.30 m/s, and with an assumed right atrial pressure of 8 mmHg, the estimated right ventricular systolic pressure is 31.5 mmHg. Left Atrium: Left atrial size was normal in size. Right Atrium: Right atrial size was normal in size. Pericardium: There is no evidence of pericardial effusion. Mitral Valve: The mitral valve is grossly normal. No evidence of mitral valve regurgitation. No evidence of mitral  valve stenosis. Tricuspid Valve: The tricuspid valve is normal in structure. Tricuspid valve regurgitation is moderate . No evidence  of tricuspid stenosis. Aortic Valve: Poor acoustic windows limit study Assessment of AV also limited by atrial fibrillation AV is thickened, calcified Peak and mean gradients through the valve are 39 and 21 mm Hg Dimensionless index is 0.33 Overall ocnsistent with moderate AS. COmpared to echo report from 2021, mean gradient is increaed. The aortic valve is calcified. There is mild calcification of the aortic valve. Aortic valve regurgitation is not visualized. Moderate aortic stenosis is present. Aortic valve mean gradient measures 28.0 mmHg. Aortic valve peak gradient measures 44.6 mmHg. Aortic valve area, by VTI measures 1.03 cm. Pulmonic Valve: The pulmonic valve was grossly normal. Pulmonic valve regurgitation is not visualized. No evidence of pulmonic stenosis. Aorta: The aortic root and ascending aorta are structurally normal, with no evidence of dilitation. IAS/Shunts: No atrial level shunt detected by color flow Doppler.  LEFT VENTRICLE PLAX 2D LVIDd:         4.40 cm   Diastology LVIDs:         2.50 cm   LV e' medial:    10.55 cm/s LV PW:         0.90 cm   LV E/e' medial:  12.9 LV IVS:        0.90 cm   LV e' lateral:   13.05 cm/s LVOT diam:     2.00 cm   LV E/e' lateral: 10.5 LV SV:         65 LV SV Index:   29 LVOT Area:     3.14 cm  RIGHT VENTRICLE RV S prime:     13.20 cm/s TAPSE (M-mode): 2.2 cm LEFT ATRIUM           Index LA diam:      4.10 cm 1.84 cm/m LA Vol (A4C): 66.4 ml 29.76 ml/m  AORTIC VALVE AV Area (Vmax):    0.98 cm AV Area (Vmean):   1.03 cm AV Area (VTI):     1.03 cm AV Vmax:           334.00 cm/s AV Vmean:          254.000 cm/s AV VTI:            0.634 m AV Peak Grad:      44.6 mmHg AV Mean Grad:      28.0 mmHg LVOT Vmax:         104.00 cm/s LVOT Vmean:        83.500 cm/s LVOT VTI:          0.207 m LVOT/AV VTI ratio: 0.33  AORTA Ao Root diam: 3.40 cm  Ao Asc diam:  3.70 cm MITRAL VALVE                TRICUSPID VALVE MV Area (PHT): 6.83 cm     TR Peak grad:   43.6 mmHg MV Decel Time: 111 msec     TR Vmax:        330.00 cm/s MV E velocity: 136.50 cm/s                             SHUNTS                             Systemic VTI:  0.21 m  Systemic Diam: 2.00 cm Dorris Carnes MD Electronically signed by Dorris Carnes MD Signature Date/Time: 06/13/2022/2:38:06 PM    Final    CT PELVIS W CONTRAST  Result Date: 06/12/2022 CLINICAL DATA:  Left buttock abscess for 1 month EXAM: CT PELVIS WITH CONTRAST TECHNIQUE: Multidetector CT imaging of the pelvis was performed using the standard protocol following the bolus administration of intravenous contrast. RADIATION DOSE REDUCTION: This exam was performed according to the departmental dose-optimization program which includes automated exposure control, adjustment of the mA and/or kV according to patient size and/or use of iterative reconstruction technique. CONTRAST:  170m OMNIPAQUE IOHEXOL 300 MG/ML  SOLN COMPARISON:  05/05/2022, 05/27/2020 FINDINGS: Urinary Tract: Distal ureters are unremarkable. Bladder is decompressed, limiting its evaluation. Bowel: No bowel obstruction or ileus. Normal appendix right lower quadrant. No bowel wall thickening or inflammatory change. Vascular/Lymphatic: Atherosclerosis of the aorta and its distal branches. No pathologically enlarged lymph nodes within the pelvis. Reproductive: Prostate is enlarged, measuring 5.5 x 4.3 cm. Fiduciary markers are seen within the prostate. Other: There is no free fluid or free intraperitoneal gas. Fat containing umbilical hernia is noted. No bowel herniation. Musculoskeletal: Multilocular gas and fluid collections are seen within the buttocks, consistent with perianal abscesses. The largest gas and fluid collection within the right gluteal region measures 6.3 x 2.7 cm, reference image 50/2. This extends anteriorly into the perineum. More  inferiorly within the buttocks are multilocular fluid collections, measuring up to 2.9 x 2.5 cm on the right and 3.8 x 2.5 cm on the left. I do not see any direct communication with the bowel lumen to suggest fistula. IMPRESSION: 1. Multilocular gas and fluid collection within the perianal region, extending from the perineum into the bilateral inferior buttocks, consistent with abscess. I do not see any communication between the gas and fluid collections and the bowel lumen. 2. Enlarged prostate. 3.  Aortic Atherosclerosis (ICD10-I70.0). Electronically Signed   By: MRanda NgoM.D.   On: 06/12/2022 20:01    DOrson Eva DO  [facility]  If 7PM-7AM, please contact night-coverage [email or web address] Password [email or web address] 06/14/2022, 2:08 PM   LOS: 2 days

## 2022-06-14 NOTE — Procedures (Signed)
Arterial Catheter Insertion Procedure Note  Justin [name]  [id number]  [birth year]  Date:06/14/22  Time:0100    Provider Performing: Danford Bad    Procedure: Insertion of Arterial Line [phone]) with US guidance ([id number])   Indication(s) Blood pressure monitoring and/or need for frequent ABGs  Consent Risks of the procedure as well as the alternatives and risks of each were explained to the patient and/or caregiver.  Consent for the procedure was obtained and is signed in the bedside chart  Anesthesia None   Time Out Verified patient identification, verified procedure, site/side was marked, verified correct patient position, special equipment/implants available, medications/allergies/relevant history reviewed, required imaging and test results available.   Sterile Technique Maximal sterile technique including full sterile barrier drape, hand hygiene, sterile gown, sterile gloves, mask, hair covering, sterile ultrasound probe cover (if used).   Procedure Description Area of catheter insertion was cleaned with chlorhexidine and draped in sterile fashion. With real-time ultrasound guidance an arterial catheter was placed into the right radial artery.  Appropriate arterial tracings confirmed on monitor.     Complications/Tolerance None; patient tolerated the procedure well.   EBL Minimal   Specimen(s) None

## 2022-06-14 NOTE — Op Note (Signed)
Asked to place central line due to limited IV access and the need for possible amiodarone drip.  Patient still on Levophed.  The procedure was done at bedside ICU bed 10.  The risks and benefits of the procedure were fully explained to the patient, and the informed consent.  The right femoral area was prepped and draped using usual sterile technique with ChloraPrep.  Surgical site confirmation was performed.  Full sterile attire was used.  1% Xylocaine was used for local anesthesia.  The right femoral vein was accessed using the Seldinger technique without difficulty.  A guidewire was then advanced.  A triple-lumen catheter was placed over the guidewire and the guidewire was removed.  Good backflow of venous blood was noted on aspiration of all 3 ports.  All 3 ports were flushed with saline.  It was secured in the place using a 3-0 silk suture.  A dry sterile dressing was applied.  The patient tolerated the procedure well.

## 2022-06-14 NOTE — Consult Note (Signed)
[facility] Renal Consultation Note  Requesting MD: Orson Eva, MD Indication for Consultation:  ESRD  Chief complaint: buttock pain  HPI:  Justin [name] is a 76 y.o. male with a history including end-stage renal disease, afib, prostate cancer status post radiation seeds, diabetes mellitus, hypertension and gout who presented to the hospital with pain in the buttock.  He had noticed a boil and this has been going on for about a month.  He tried warm compresses at home.  He was found to have a perianal abscess.  Surgery has been consulted and he was stated on vancomycin and zosyn.  Note he was initiated on levo (which is at 8 mcg/min) and midodrine for hypotension.  BP was 95/54 on cuff and 99/52 on Aline on my exam.  He has laid down in his chair for dialysis but states he has been going and hasn't missed any treatments.    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)    Gout    Hypertension    Right posterior capsular opacification 02/27/2021   Vitreous hemorrhage of right eye ([place]) 10/24/2019    Past Surgical History:  Procedure Laterality Date   A/V FISTULAGRAM Left 08/17/2017   Procedure: A/V FISTULAGRAM;  Surgeon: Katha Cabal, MD;  Location: [place] [facility];  Service: Cardiovascular;  Laterality: Left;   A/V SHUNT INTERVENTION N/A 08/17/2017   Procedure: A/V SHUNT INTERVENTION;  Surgeon: Katha Cabal, MD;  Location: [place] [facility];  Service: Cardiovascular;  Laterality: N/A;   AV FISTULA PLACEMENT Left 05/31/2015   Procedure: ARTERIOVENOUS (AV) FISTULA CREATION;  Surgeon: Katha Cabal, MD;  Location: [facility] ORS;  Service:  Vascular;  Laterality: Left;   excision bone spurs Bilateral 1989   feet   KNEE SURGERY Left 1998   arthroscopy   SHOULDER SURGERY Left 1994   rotator cuff    Family Hx:  Family History  Problem Relation Age of Onset   Kidney disease Mother    Alcoholism Father    Alcoholism Brother     Social History:  reports that he quit smoking about 43 years ago. His smoking use included cigarettes. He has never used smokeless tobacco. He reports that he does not drink alcohol and does not use drugs.  Allergies: No Known Allergies  Medications: Prior to Admission medications   Medication Sig Start Date End Date Taking? Authorizing Provider  allopurinol (ZYLOPRIM) 100 MG tablet Take 100 mg by mouth daily. 09/13/21  Yes [provider]  amiodarone (PACERONE) 200 MG tablet Take 200 mg by mouth daily. 03/18/22  Yes [provider]  atorvastatin (LIPITOR) 40 MG tablet Take 40 mg by mouth daily.  10/30/14  Yes [provider]  Doxercalciferol (HECTOROL IV) Doxercalciferol (Hectorol) 08/25/21 08/24/22 Yes [provider]  ELIQUIS 5 MG TABS tablet Take 5 mg by mouth 2 (two) times daily. 10/07/19  Yes [provider]  LOKELMA 10 g PACK packet Take 10 g by mouth 2 (two) times a week. 06/08/22  Yes [provider]  Methoxy PEG-Epoetin Beta (MIRCERA IJ) Mircera 06/13/21 12/18/22 Yes [provider]  omeprazole (PRILOSEC) 20 MG capsule Take 20 mg by mouth every morning.    Yes [provider]  VELPHORO 500 MG chewable tablet Chew 500 mg by mouth 2 (two) times daily. 06/03/22  Yes [provider]  allopurinol (ZYLOPRIM) 300 MG tablet Take 300 mg by mouth daily. Patient not taking: Reported on 05/12/2022 10/13/19   [provider]  carvedilol (COREG) 25 MG tablet Take by mouth. Patient not taking: Reported on 06/14/2022 04/17/10   [provider]  cinacalcet (SENSIPAR) 30 MG tablet  05/06/22   [provider]   HYDROcodone-acetaminophen (NORCO/VICODIN) 5-325 MG tablet Take 1 tablet by mouth every 6 (six) hours as needed for severe pain. Patient not taking: Reported on 06/14/2022 05/05/22   Evalee Jefferson, PA-C    I have reviewed the patient's current and reported prior to admission medications.  Labs:     Latest Ref Rng & Units 06/14/2022    3:13 AM 06/13/2022    4:51 AM 06/12/2022    6:32 PM  BMP  Glucose 70 - 99 mg/dL 289  164  154   BUN 8 - 23 mg/dL 42  29  21   Creatinine 0.61 - 1.24 mg/dL 8.39  6.37  5.32   Sodium 135 - 145 mmol/L 134  138  140   Potassium 3.5 - 5.1 mmol/L 3.8  3.6  3.5   Chloride 98 - 111 mmol/L 93  96  95   CO2 22 - 32 mmol/L 27  30  32   Calcium 8.9 - 10.3 mg/dL 7.9  8.3  8.6     Urinalysis    Component Value Date/Time   COLORURINE AMBER (A) 05/27/2020 1451   APPEARANCEUR CLOUDY (A) 05/27/2020 1451   LABSPEC 1.017 05/27/2020 1451   PHURINE 6.0 05/27/2020 1451   GLUCOSEU 50 (A) 05/27/2020 1451   HGBUR LARGE (A) 05/27/2020 1451   BILIRUBINUR NEGATIVE 05/27/2020 1451   KETONESUR NEGATIVE 05/27/2020 1451   PROTEINUR 100 (A) 05/27/2020 1451   UROBILINOGEN 0.2 08/02/2010 1534   NITRITE NEGATIVE 05/27/2020 1451   LEUKOCYTESUR SMALL (A) 05/27/2020 1451     ROS:  Pertinent items noted in HPI and remainder of comprehensive ROS otherwise negative.  Physical Exam: Vitals:   06/14/22 1000 06/14/22 1200  BP: (!) 106/47 (!) 84/44  Pulse:    Resp:    Temp:    SpO2: 90% 97%     General:  adult male in bed lying propped up on one side   HEENT: NCAT Eyes: EOMI sclera anicteric Neck: supple trachea midline  Heart: S1S2 no rub Lungs: clear and unlabored; on supplemental oxygen  Abdomen: soft/nt/nd Extremities: no pitting edema; no cyanosis or clubbing Skin: no rash on extremities exposed  Neuro: alert and oriented x 3 provides hx and follows commands  Psych normal mood and affect Access LUE AVF with bruit and thrill   Assessment/Plan:  # ESRD  - HD per MWF  schedule  - usually gets HD at [facility] in [place] on [address] - Will need to obtain outpatient HD rx   # Perirectal abscess - Surgery has been consulted  - antibiotics per primary team   # Septic shock   - Antibiotics per primary team  - pressors per primary team   # A fib  - Per primary team    # Metabolic bone disease  - Continue velphoro  - On sensipar per charting  - Will need to obtain outpatient HD rx   # Anemia  CKD - Will need to get outpatient HD rx   Thank you for the consult.  Please do not hesitate to contact us with any questions regarding the patient   Claudia Desanctis 06/14/2022, 7:22 PM

## 2022-06-14 NOTE — H&P (View-Only) (Signed)
Subjective: Patient still has perirectal pain.  No drainage has been noted.  Objective: Vital signs in last 24 hours: Temp:  [98.4 F (36.9 C)-101 F (38.3 C)] 99.6 F (37.6 C) (12/03 0800) Resp:  [17-28] 26 (12/03 0800) BP: (86-172)/(37-131) 95/52 (12/03 0800) SpO2:  [94 %] 94 % (12/03 0630) Last BM Date : 06/13/22  Intake/Output from previous day: 12/02 0701 - 12/03 0700 In: 1417.1 [P.O.:960; I.V.:207.1; IV Piggyback:250] Out: -  Intake/Output this shift: No intake/output data recorded.  General appearance: alert, cooperative, and no distress Pelvic: No drainage from perirectally region.  Lab Results:  Recent Labs    06/13/22 0451 06/14/22 0313  WBC 17.5* 24.3*  HGB 9.7* 9.8*  HCT 31.3* 31.1*  PLT 199 231   BMET Recent Labs    06/13/22 0451 06/14/22 0313  NA 138 134*  K 3.6 3.8  CL 96* 93*  CO2 30 27  GLUCOSE 164* 289*  BUN 29* 42*  CREATININE 6.37* 8.39*  CALCIUM 8.3* 7.9*   PT/INR No results for input(s): "LABPROT", "INR" in the last 72 hours.  Studies/Results: ECHOCARDIOGRAM COMPLETE  Result Date: 06/13/2022    ECHOCARDIOGRAM REPORT   Patient Name:   Justin [name] Date of Exam: 06/13/2022 Medical Rec #:  [id number]      Height:       70.0 in Accession #:    [id number]     Weight:       234.0 lb Date of Birth:  05/15/1946      BSA:          2.231 m Patient Age:    76 years       BP:           98/51 mmHg Patient Gender: M              HR:           109 bpm. Exam Location:  Inpatient Procedure: 2D Echo, Color Doppler, Cardiac Doppler and Intracardiac            Opacification Agent Indications:    Atrial Fibrillation I48.91  History:        Patient has prior history of Echocardiogram examinations, most                 recent 01/30/2020. Aortic Valve Disease, Arrythmias:Atrial                 Fibrillation; Risk Factors:Hypertension and Dyslipidemia. End                 stage renal disease. Severe sepsis.  Sonographer:    Darlina Sicilian RDCS Referring Phys: [phone]  [facility]  Sonographer Comments: Suboptimal apical window, suboptimal subcostal window, suboptimal parasternal window and Technically difficult study due to poor echo windows. IMPRESSIONS  1. Left ventricular ejection fraction, by estimation, is 60 to 65%. The left ventricle has normal function. Left ventricular diastolic parameters are indeterminate.  2. Right ventricular systolic function is low normal. The right ventricular size is mildly enlarged. There is moderately elevated pulmonary artery systolic pressure.  3. The mitral valve is grossly normal. No evidence of mitral valve regurgitation. No evidence of mitral stenosis.  4. Tricuspid valve regurgitation is moderate.  5. Poor acoustic windows limit study Assessment of AV also limited by atrial fibrillation     AV is thickened, calcified Peak and mean gradients through the valve are 39 and 21 mm Hg Dimensionless index is 0.33     Overall ocnsistent with moderate  AS. COmpared to echo report from 2021, mean gradient is increaed. The aortic valve is calcified. There is mild calcification of the aortic valve. Aortic valve regurgitation is not visualized. Moderate aortic valve stenosis. FINDINGS  Left Ventricle: Left ventricular ejection fraction, by estimation, is 60 to 65%. The left ventricle has normal function. Definity contrast agent was given IV to delineate the left ventricular endocardial borders. The left ventricular internal cavity size was normal in size. There is no left ventricular hypertrophy. Left ventricular diastolic parameters are indeterminate. Right Ventricle: The right ventricular size is mildly enlarged. No increase in right ventricular wall thickness. Right ventricular systolic function is low normal. There is moderately elevated pulmonary artery systolic pressure. The tricuspid regurgitant  velocity is 3.30 m/s, and with an assumed right atrial pressure of 8 mmHg, the estimated right ventricular systolic pressure is 00.9 mmHg. Left  Atrium: Left atrial size was normal in size. Right Atrium: Right atrial size was normal in size. Pericardium: There is no evidence of pericardial effusion. Mitral Valve: The mitral valve is grossly normal. No evidence of mitral valve regurgitation. No evidence of mitral valve stenosis. Tricuspid Valve: The tricuspid valve is normal in structure. Tricuspid valve regurgitation is moderate . No evidence of tricuspid stenosis. Aortic Valve: Poor acoustic windows limit study Assessment of AV also limited by atrial fibrillation AV is thickened, calcified Peak and mean gradients through the valve are 39 and 21 mm Hg Dimensionless index is 0.33 Overall ocnsistent with moderate AS. COmpared to echo report from 2021, mean gradient is increaed. The aortic valve is calcified. There is mild calcification of the aortic valve. Aortic valve regurgitation is not visualized. Moderate aortic stenosis is present. Aortic valve mean gradient measures 28.0 mmHg. Aortic valve peak gradient measures 44.6 mmHg. Aortic valve area, by VTI measures 1.03 cm. Pulmonic Valve: The pulmonic valve was grossly normal. Pulmonic valve regurgitation is not visualized. No evidence of pulmonic stenosis. Aorta: The aortic root and ascending aorta are structurally normal, with no evidence of dilitation. IAS/Shunts: No atrial level shunt detected by color flow Doppler.  LEFT VENTRICLE PLAX 2D LVIDd:         4.40 cm   Diastology LVIDs:         2.50 cm   LV e' medial:    10.55 cm/s LV PW:         0.90 cm   LV E/e' medial:  12.9 LV IVS:        0.90 cm   LV e' lateral:   13.05 cm/s LVOT diam:     2.00 cm   LV E/e' lateral: 10.5 LV SV:         65 LV SV Index:   29 LVOT Area:     3.14 cm  RIGHT VENTRICLE RV S prime:     13.20 cm/s TAPSE (M-mode): 2.2 cm LEFT ATRIUM           Index LA diam:      4.10 cm 1.84 cm/m LA Vol (A4C): 66.4 ml 29.76 ml/m  AORTIC VALVE AV Area (Vmax):    0.98 cm AV Area (Vmean):   1.03 cm AV Area (VTI):     1.03 cm AV Vmax:            334.00 cm/s AV Vmean:          254.000 cm/s AV VTI:            0.634 m AV Peak Grad:      44.6 mmHg  AV Mean Grad:      28.0 mmHg LVOT Vmax:         104.00 cm/s LVOT Vmean:        83.500 cm/s LVOT VTI:          0.207 m LVOT/AV VTI ratio: 0.33  AORTA Ao Root diam: 3.40 cm Ao Asc diam:  3.70 cm MITRAL VALVE                TRICUSPID VALVE MV Area (PHT): 6.83 cm     TR Peak grad:   43.6 mmHg MV Decel Time: 111 msec     TR Vmax:        330.00 cm/s MV E velocity: 136.50 cm/s                             SHUNTS                             Systemic VTI:  0.21 m                             Systemic Diam: 2.00 cm Dorris Carnes MD Electronically signed by Dorris Carnes MD Signature Date/Time: 06/13/2022/2:38:06 PM    Final    CT PELVIS W CONTRAST  Result Date: 06/12/2022 CLINICAL DATA:  Left buttock abscess for 1 month EXAM: CT PELVIS WITH CONTRAST TECHNIQUE: Multidetector CT imaging of the pelvis was performed using the standard protocol following the bolus administration of intravenous contrast. RADIATION DOSE REDUCTION: This exam was performed according to the departmental dose-optimization program which includes automated exposure control, adjustment of the mA and/or kV according to patient size and/or use of iterative reconstruction technique. CONTRAST:  126m OMNIPAQUE IOHEXOL 300 MG/ML  SOLN COMPARISON:  05/05/2022, 05/27/2020 FINDINGS: Urinary Tract: Distal ureters are unremarkable. Bladder is decompressed, limiting its evaluation. Bowel: No bowel obstruction or ileus. Normal appendix right lower quadrant. No bowel wall thickening or inflammatory change. Vascular/Lymphatic: Atherosclerosis of the aorta and its distal branches. No pathologically enlarged lymph nodes within the pelvis. Reproductive: Prostate is enlarged, measuring 5.5 x 4.3 cm. Fiduciary markers are seen within the prostate. Other: There is no free fluid or free intraperitoneal gas. Fat containing umbilical hernia is noted. No bowel herniation.  Musculoskeletal: Multilocular gas and fluid collections are seen within the buttocks, consistent with perianal abscesses. The largest gas and fluid collection within the right gluteal region measures 6.3 x 2.7 cm, reference image 50/2. This extends anteriorly into the perineum. More inferiorly within the buttocks are multilocular fluid collections, measuring up to 2.9 x 2.5 cm on the right and 3.8 x 2.5 cm on the left. I do not see any direct communication with the bowel lumen to suggest fistula. IMPRESSION: 1. Multilocular gas and fluid collection within the perianal region, extending from the perineum into the bilateral inferior buttocks, consistent with abscess. I do not see any communication between the gas and fluid collections and the bowel lumen. 2. Enlarged prostate. 3.  Aortic Atherosclerosis (ICD10-I70.0). Electronically Signed   By: MRanda NgoM.D.   On: 06/12/2022 20:01    Anti-infectives: Anti-infectives (From admission, onward)    Start     Dose/Rate Route Frequency Ordered Stop   06/13/22 1400  piperacillin-tazobactam (ZOSYN) 2.25 g in sodium chloride 0.9 % 50 mL IVPB        2.25 g 100  mL/hr over 30 Minutes Intravenous Every 8 hours 06/13/22 1038     06/13/22 0600  piperacillin-tazobactam (ZOSYN) IVPB 2.25 g  Status:  Discontinued        2.25 g 100 mL/hr over 30 Minutes Intravenous Every 8 hours 06/12/22 2038 06/13/22 1037   06/12/22 2045  vancomycin (VANCOREADY) IVPB 1500 mg/300 mL        1,500 mg 150 mL/hr over 120 Minutes Intravenous  Once 06/12/22 2038 06/13/22 0043   06/12/22 2045  piperacillin-tazobactam (ZOSYN) IVPB 3.375 g        3.375 g 100 mL/hr over 30 Minutes Intravenous  Once 06/12/22 2038 06/12/22 2154       Assessment/Plan: Impression: Perirectal abscess Plan: Will proceed with incision and drainage of perirectal abscess on 06/15/2022.  The risks and benefits of the procedure including bleeding, infection, and the possibility of remaining intubated after the  surgery were fully explained to the patient, who gave informed consent.  LOS: 2 days    Aviva Signs 06/14/2022

## 2022-06-14 NOTE — Progress Notes (Deleted)
   NEPHROLOGY NURSING NOTE:  Below are serologies obtained from [facility] / CareEverywhere:  Infectious Diseases Result Type Result Value Relevant Reference Range Interpretation Date  Hep B Surface Ag (HBsAg) Negative Negative - July 14, 2021  Hep B Surface Ab (anti-HBs) 113 mIU/mL < 10 mIU/mL, Non- Immune - July 14, 2021  HCV Ab (anti-HCV) Nonreactive Non-Reactive - July 14, 2021    Rockwell Alexandria, RN AP [facility]

## 2022-06-14 NOTE — Progress Notes (Signed)
Subjective: Patient still has perirectal pain.  No drainage has been noted.  Objective: Vital signs in last 24 hours: Temp:  [98.4 F (36.9 C)-101 F (38.3 C)] 99.6 F (37.6 C) (12/03 0800) Resp:  [17-28] 26 (12/03 0800) BP: (86-172)/(37-131) 95/52 (12/03 0800) SpO2:  [94 %] 94 % (12/03 0630) Last BM Date : 06/13/22  Intake/Output from previous day: 12/02 0701 - 12/03 0700 In: 1417.1 [P.O.:960; I.V.:207.1; IV Piggyback:250] Out: -  Intake/Output this shift: No intake/output data recorded.  General appearance: alert, cooperative, and no distress Pelvic: No drainage from perirectally region.  Lab Results:  Recent Labs    06/13/22 0451 06/14/22 0313  WBC 17.5* 24.3*  HGB 9.7* 9.8*  HCT 31.3* 31.1*  PLT 199 231   BMET Recent Labs    06/13/22 0451 06/14/22 0313  NA 138 134*  K 3.6 3.8  CL 96* 93*  CO2 30 27  GLUCOSE 164* 289*  BUN 29* 42*  CREATININE 6.37* 8.39*  CALCIUM 8.3* 7.9*   PT/INR No results for input(s): "LABPROT", "INR" in the last 72 hours.  Studies/Results: ECHOCARDIOGRAM COMPLETE  Result Date: 06/13/2022    ECHOCARDIOGRAM REPORT   Patient Name:   Justin [name] Date of Exam: 06/13/2022 Medical Rec #:  [id number]      Height:       70.0 in Accession #:    [id number]     Weight:       234.0 lb Date of Birth:  Apr 26, 1946      BSA:          2.231 m Patient Age:    76 years       BP:           98/51 mmHg Patient Gender: M              HR:           109 bpm. Exam Location:  Inpatient Procedure: 2D Echo, Color Doppler, Cardiac Doppler and Intracardiac            Opacification Agent Indications:    Atrial Fibrillation I48.91  History:        Patient has prior history of Echocardiogram examinations, most                 recent 01/30/2020. Aortic Valve Disease, Arrythmias:Atrial                 Fibrillation; Risk Factors:Hypertension and Dyslipidemia. End                 stage renal disease. Severe sepsis.  Sonographer:    Darlina Sicilian RDCS Referring Phys: [phone]  [facility]  Sonographer Comments: Suboptimal apical window, suboptimal subcostal window, suboptimal parasternal window and Technically difficult study due to poor echo windows. IMPRESSIONS  1. Left ventricular ejection fraction, by estimation, is 60 to 65%. The left ventricle has normal function. Left ventricular diastolic parameters are indeterminate.  2. Right ventricular systolic function is low normal. The right ventricular size is mildly enlarged. There is moderately elevated pulmonary artery systolic pressure.  3. The mitral valve is grossly normal. No evidence of mitral valve regurgitation. No evidence of mitral stenosis.  4. Tricuspid valve regurgitation is moderate.  5. Poor acoustic windows limit study Assessment of AV also limited by atrial fibrillation     AV is thickened, calcified Peak and mean gradients through the valve are 39 and 21 mm Hg Dimensionless index is 0.33     Overall ocnsistent with moderate  AS. COmpared to echo report from 2021, mean gradient is increaed. The aortic valve is calcified. There is mild calcification of the aortic valve. Aortic valve regurgitation is not visualized. Moderate aortic valve stenosis. FINDINGS  Left Ventricle: Left ventricular ejection fraction, by estimation, is 60 to 65%. The left ventricle has normal function. Definity contrast agent was given IV to delineate the left ventricular endocardial borders. The left ventricular internal cavity size was normal in size. There is no left ventricular hypertrophy. Left ventricular diastolic parameters are indeterminate. Right Ventricle: The right ventricular size is mildly enlarged. No increase in right ventricular wall thickness. Right ventricular systolic function is low normal. There is moderately elevated pulmonary artery systolic pressure. The tricuspid regurgitant  velocity is 3.30 m/s, and with an assumed right atrial pressure of 8 mmHg, the estimated right ventricular systolic pressure is 74.2 mmHg. Left  Atrium: Left atrial size was normal in size. Right Atrium: Right atrial size was normal in size. Pericardium: There is no evidence of pericardial effusion. Mitral Valve: The mitral valve is grossly normal. No evidence of mitral valve regurgitation. No evidence of mitral valve stenosis. Tricuspid Valve: The tricuspid valve is normal in structure. Tricuspid valve regurgitation is moderate . No evidence of tricuspid stenosis. Aortic Valve: Poor acoustic windows limit study Assessment of AV also limited by atrial fibrillation AV is thickened, calcified Peak and mean gradients through the valve are 39 and 21 mm Hg Dimensionless index is 0.33 Overall ocnsistent with moderate AS. COmpared to echo report from 2021, mean gradient is increaed. The aortic valve is calcified. There is mild calcification of the aortic valve. Aortic valve regurgitation is not visualized. Moderate aortic stenosis is present. Aortic valve mean gradient measures 28.0 mmHg. Aortic valve peak gradient measures 44.6 mmHg. Aortic valve area, by VTI measures 1.03 cm. Pulmonic Valve: The pulmonic valve was grossly normal. Pulmonic valve regurgitation is not visualized. No evidence of pulmonic stenosis. Aorta: The aortic root and ascending aorta are structurally normal, with no evidence of dilitation. IAS/Shunts: No atrial level shunt detected by color flow Doppler.  LEFT VENTRICLE PLAX 2D LVIDd:         4.40 cm   Diastology LVIDs:         2.50 cm   LV e' medial:    10.55 cm/s LV PW:         0.90 cm   LV E/e' medial:  12.9 LV IVS:        0.90 cm   LV e' lateral:   13.05 cm/s LVOT diam:     2.00 cm   LV E/e' lateral: 10.5 LV SV:         65 LV SV Index:   29 LVOT Area:     3.14 cm  RIGHT VENTRICLE RV S prime:     13.20 cm/s TAPSE (M-mode): 2.2 cm LEFT ATRIUM           Index LA diam:      4.10 cm 1.84 cm/m LA Vol (A4C): 66.4 ml 29.76 ml/m  AORTIC VALVE AV Area (Vmax):    0.98 cm AV Area (Vmean):   1.03 cm AV Area (VTI):     1.03 cm AV Vmax:            334.00 cm/s AV Vmean:          254.000 cm/s AV VTI:            0.634 m AV Peak Grad:      44.6 mmHg  AV Mean Grad:      28.0 mmHg LVOT Vmax:         104.00 cm/s LVOT Vmean:        83.500 cm/s LVOT VTI:          0.207 m LVOT/AV VTI ratio: 0.33  AORTA Ao Root diam: 3.40 cm Ao Asc diam:  3.70 cm MITRAL VALVE                TRICUSPID VALVE MV Area (PHT): 6.83 cm     TR Peak grad:   43.6 mmHg MV Decel Time: 111 msec     TR Vmax:        330.00 cm/s MV E velocity: 136.50 cm/s                             SHUNTS                             Systemic VTI:  0.21 m                             Systemic Diam: 2.00 cm Dorris Carnes MD Electronically signed by Dorris Carnes MD Signature Date/Time: 06/13/2022/2:38:06 PM    Final    CT PELVIS W CONTRAST  Result Date: 06/12/2022 CLINICAL DATA:  Left buttock abscess for 1 month EXAM: CT PELVIS WITH CONTRAST TECHNIQUE: Multidetector CT imaging of the pelvis was performed using the standard protocol following the bolus administration of intravenous contrast. RADIATION DOSE REDUCTION: This exam was performed according to the departmental dose-optimization program which includes automated exposure control, adjustment of the mA and/or kV according to patient size and/or use of iterative reconstruction technique. CONTRAST:  120m OMNIPAQUE IOHEXOL 300 MG/ML  SOLN COMPARISON:  05/05/2022, 05/27/2020 FINDINGS: Urinary Tract: Distal ureters are unremarkable. Bladder is decompressed, limiting its evaluation. Bowel: No bowel obstruction or ileus. Normal appendix right lower quadrant. No bowel wall thickening or inflammatory change. Vascular/Lymphatic: Atherosclerosis of the aorta and its distal branches. No pathologically enlarged lymph nodes within the pelvis. Reproductive: Prostate is enlarged, measuring 5.5 x 4.3 cm. Fiduciary markers are seen within the prostate. Other: There is no free fluid or free intraperitoneal gas. Fat containing umbilical hernia is noted. No bowel herniation.  Musculoskeletal: Multilocular gas and fluid collections are seen within the buttocks, consistent with perianal abscesses. The largest gas and fluid collection within the right gluteal region measures 6.3 x 2.7 cm, reference image 50/2. This extends anteriorly into the perineum. More inferiorly within the buttocks are multilocular fluid collections, measuring up to 2.9 x 2.5 cm on the right and 3.8 x 2.5 cm on the left. I do not see any direct communication with the bowel lumen to suggest fistula. IMPRESSION: 1. Multilocular gas and fluid collection within the perianal region, extending from the perineum into the bilateral inferior buttocks, consistent with abscess. I do not see any communication between the gas and fluid collections and the bowel lumen. 2. Enlarged prostate. 3.  Aortic Atherosclerosis (ICD10-I70.0). Electronically Signed   By: MRanda NgoM.D.   On: 06/12/2022 20:01    Anti-infectives: Anti-infectives (From admission, onward)    Start     Dose/Rate Route Frequency Ordered Stop   06/13/22 1400  piperacillin-tazobactam (ZOSYN) 2.25 g in sodium chloride 0.9 % 50 mL IVPB        2.25 g 100  mL/hr over 30 Minutes Intravenous Every 8 hours 06/13/22 1038     06/13/22 0600  piperacillin-tazobactam (ZOSYN) IVPB 2.25 g  Status:  Discontinued        2.25 g 100 mL/hr over 30 Minutes Intravenous Every 8 hours 06/12/22 2038 06/13/22 1037   06/12/22 2045  vancomycin (VANCOREADY) IVPB 1500 mg/300 mL        1,500 mg 150 mL/hr over 120 Minutes Intravenous  Once 06/12/22 2038 06/13/22 0043   06/12/22 2045  piperacillin-tazobactam (ZOSYN) IVPB 3.375 g        3.375 g 100 mL/hr over 30 Minutes Intravenous  Once 06/12/22 2038 06/12/22 2154       Assessment/Plan: Impression: Perirectal abscess Plan: Will proceed with incision and drainage of perirectal abscess on 06/15/2022.  The risks and benefits of the procedure including bleeding, infection, and the possibility of remaining intubated after the  surgery were fully explained to the patient, who gave informed consent.  LOS: 2 days    Aviva Signs 06/14/2022

## 2022-06-15 ENCOUNTER — Inpatient Hospital Stay (HOSPITAL_COMMUNITY): Payer: Medicare Other

## 2022-06-15 ENCOUNTER — Inpatient Hospital Stay (HOSPITAL_COMMUNITY): Payer: Medicare Other | Admitting: Anesthesiology

## 2022-06-15 ENCOUNTER — Other Ambulatory Visit: Payer: Self-pay

## 2022-06-15 ENCOUNTER — Encounter (HOSPITAL_COMMUNITY)
Admission: EM | Disposition: A | Discharge status: Skilled Nursing Facility | Payer: Self-pay | Source: Home / Self Care | Attending: Family Medicine

## 2022-06-15 DIAGNOSIS — E1122 Type 2 diabetes mellitus with diabetic chronic kidney disease: Secondary | ICD-10-CM | POA: Diagnosis not present

## 2022-06-15 DIAGNOSIS — I120 Hypertensive chronic kidney disease with stage 5 chronic kidney disease or end stage renal disease: Secondary | ICD-10-CM

## 2022-06-15 DIAGNOSIS — K611 Rectal abscess: Secondary | ICD-10-CM

## 2022-06-15 DIAGNOSIS — J9602 Acute respiratory failure with hypercapnia: Secondary | ICD-10-CM

## 2022-06-15 DIAGNOSIS — J9601 Acute respiratory failure with hypoxia: Secondary | ICD-10-CM

## 2022-06-15 DIAGNOSIS — R6521 Severe sepsis with septic shock: Secondary | ICD-10-CM

## 2022-06-15 DIAGNOSIS — A419 Sepsis, unspecified organism: Secondary | ICD-10-CM | POA: Diagnosis not present

## 2022-06-15 DIAGNOSIS — K610 Anal abscess: Secondary | ICD-10-CM | POA: Diagnosis not present

## 2022-06-15 DIAGNOSIS — N186 End stage renal disease: Secondary | ICD-10-CM | POA: Diagnosis not present

## 2022-06-15 DIAGNOSIS — Z87891 Personal history of nicotine dependence: Secondary | ICD-10-CM

## 2022-06-15 DIAGNOSIS — E669 Obesity, unspecified: Secondary | ICD-10-CM | POA: Diagnosis not present

## 2022-06-15 DIAGNOSIS — I4729 Other ventricular tachycardia: Secondary | ICD-10-CM

## 2022-06-15 HISTORY — DX: Acute respiratory failure with hypoxia: J96.01

## 2022-06-15 HISTORY — PX: INCISION AND DRAINAGE PERIRECTAL ABSCESS: SHX1804

## 2022-06-15 LAB — BLOOD GAS, ARTERIAL
Acid-Base Excess: 3.8 mmol/L — ABNORMAL HIGH (ref 0.0–2.0)
Bicarbonate: 28.5 mmol/L — ABNORMAL HIGH (ref 20.0–28.0)
Drawn by: 23430
O2 Saturation: 98.3 %
Patient temperature: 37
pCO2 arterial: 43 mmHg (ref 32–48)
pH, Arterial: 7.43 (ref 7.35–7.45)
pO2, Arterial: 101 mmHg (ref 83–108)

## 2022-06-15 LAB — GLUCOSE, CAPILLARY
Glucose-Capillary: 137 mg/dL — ABNORMAL HIGH (ref 70–99)
Glucose-Capillary: 261 mg/dL — ABNORMAL HIGH (ref 70–99)

## 2022-06-15 LAB — RENAL FUNCTION PANEL
Albumin: 2.6 g/dL — ABNORMAL LOW (ref 3.5–5.0)
Anion gap: 16 — ABNORMAL HIGH (ref 5–15)
BUN: 55 mg/dL — ABNORMAL HIGH (ref 8–23)
CO2: 26 mmol/L (ref 22–32)
Calcium: 8.3 mg/dL — ABNORMAL LOW (ref 8.9–10.3)
Chloride: 94 mmol/L — ABNORMAL LOW (ref 98–111)
Creatinine, Ser: 10.39 mg/dL — ABNORMAL HIGH (ref 0.61–1.24)
GFR, Estimated: 5 mL/min — ABNORMAL LOW
Glucose, Bld: 196 mg/dL — ABNORMAL HIGH (ref 70–99)
Phosphorus: 5.1 mg/dL — ABNORMAL HIGH (ref 2.5–4.6)
Potassium: 4.4 mmol/L (ref 3.5–5.1)
Sodium: 136 mmol/L (ref 135–145)

## 2022-06-15 LAB — CBC
HCT: 30.2 % — ABNORMAL LOW (ref 39.0–52.0)
Hemoglobin: 9.5 g/dL — ABNORMAL LOW (ref 13.0–17.0)
MCH: 31 pg (ref 26.0–34.0)
MCHC: 31.5 g/dL (ref 30.0–36.0)
MCV: 98.7 fL (ref 80.0–100.0)
Platelets: 237 10*3/uL (ref 150–400)
RBC: 3.06 MIL/uL — ABNORMAL LOW (ref 4.22–5.81)
RDW: 14.2 % (ref 11.5–15.5)
WBC: 25.3 10*3/uL — ABNORMAL HIGH (ref 4.0–10.5)
nRBC: 0 % (ref 0.0–0.2)

## 2022-06-15 LAB — PROCALCITONIN: Procalcitonin: 1.5 ng/mL

## 2022-06-15 LAB — HEPATITIS B SURFACE ANTIGEN: Hepatitis B Surface Ag: NONREACTIVE

## 2022-06-15 SURGERY — INCISION AND DRAINAGE, ABSCESS, PERIRECTAL
Anesthesia: General | Site: Rectum

## 2022-06-15 MED ORDER — PROPOFOL 10 MG/ML IV BOLUS
INTRAVENOUS | Status: AC
  Filled 2022-06-15: qty 20

## 2022-06-15 MED ORDER — FENTANYL CITRATE (PF) 100 MCG/2ML IJ SOLN
INTRAMUSCULAR | Status: DC | PRN
  Administered 2022-06-15: 150 ug via INTRAVENOUS
  Administered 2022-06-15: 100 ug via INTRAVENOUS

## 2022-06-15 MED ORDER — FENTANYL CITRATE (PF) 100 MCG/2ML IJ SOLN
INTRAMUSCULAR | Status: AC
  Administered 2022-06-15: 100 ug
  Filled 2022-06-15: qty 2

## 2022-06-15 MED ORDER — BUPIVACAINE HCL (PF) 0.5 % IJ SOLN
INTRAMUSCULAR | Status: AC
  Filled 2022-06-15: qty 30

## 2022-06-15 MED ORDER — ALBUTEROL SULFATE HFA 108 (90 BASE) MCG/ACT IN AERS
INHALATION_SPRAY | RESPIRATORY_TRACT | Status: AC
  Filled 2022-06-15: qty 6.7

## 2022-06-15 MED ORDER — ONDANSETRON HCL 4 MG/2ML IJ SOLN
INTRAMUSCULAR | Status: AC
  Filled 2022-06-15: qty 2

## 2022-06-15 MED ORDER — VANCOMYCIN HCL IN DEXTROSE 1-5 GM/200ML-% IV SOLN
1000.0000 mg | INTRAVENOUS | Status: DC
  Administered 2022-06-15 – 2022-06-17 (×2): 1000 mg via INTRAVENOUS
  Filled 2022-06-15 (×2): qty 200

## 2022-06-15 MED ORDER — ORAL CARE MOUTH RINSE: 15.0000 mL | Freq: Once | OROMUCOSAL | Status: AC

## 2022-06-15 MED ORDER — ONDANSETRON HCL 4 MG/2ML IJ SOLN
INTRAMUSCULAR | Status: DC | PRN
  Administered 2022-06-15: 4 mg via INTRAVENOUS

## 2022-06-15 MED ORDER — LIDOCAINE-PRILOCAINE 2.5-2.5 % EX CREA: 1.0000 | TOPICAL_CREAM | CUTANEOUS | Status: DC | PRN

## 2022-06-15 MED ORDER — BUPIVACAINE HCL (PF) 0.25 % IJ SOLN
INTRAMUSCULAR | Status: AC
  Filled 2022-06-15: qty 30

## 2022-06-15 MED ORDER — AMIODARONE HCL IN DEXTROSE 360-4.14 MG/200ML-% IV SOLN
30.0000 mg/h | INTRAVENOUS | Status: DC
  Administered 2022-06-15 – 2022-06-18 (×6): 30 mg/h via INTRAVENOUS
  Filled 2022-06-15 (×5): qty 200

## 2022-06-15 MED ORDER — PHENYLEPHRINE HCL (PRESSORS) 10 MG/ML IV SOLN
INTRAVENOUS | Status: DC | PRN
  Administered 2022-06-15: 160 ug via INTRAVENOUS

## 2022-06-15 MED ORDER — GLYCOPYRROLATE PF 0.2 MG/ML IJ SOSY
PREFILLED_SYRINGE | INTRAMUSCULAR | Status: AC
  Filled 2022-06-15: qty 1

## 2022-06-15 MED ORDER — GLYCOPYRROLATE PF 0.2 MG/ML IJ SOSY
PREFILLED_SYRINGE | INTRAMUSCULAR | Status: DC | PRN
  Administered 2022-06-15: .2 mg via INTRAVENOUS

## 2022-06-15 MED ORDER — SUGAMMADEX SODIUM 500 MG/5ML IV SOLN
INTRAVENOUS | Status: AC
  Filled 2022-06-15: qty 5

## 2022-06-15 MED ORDER — PHENYLEPHRINE 80 MCG/ML (10ML) SYRINGE FOR IV PUSH (FOR BLOOD PRESSURE SUPPORT)
PREFILLED_SYRINGE | INTRAVENOUS | Status: AC
  Filled 2022-06-15: qty 10

## 2022-06-15 MED ORDER — LIDOCAINE HCL (PF) 2 % IJ SOLN
INTRAMUSCULAR | Status: AC
  Filled 2022-06-15: qty 15

## 2022-06-15 MED ORDER — ORAL CARE MOUTH RINSE: 15.0000 mL | OROMUCOSAL | Status: DC | PRN

## 2022-06-15 MED ORDER — FENTANYL CITRATE PF 50 MCG/ML IJ SOSY
25.0000 ug | PREFILLED_SYRINGE | INTRAMUSCULAR | Status: DC | PRN
  Administered 2022-06-16: 50 ug via INTRAVENOUS
  Administered 2022-06-16: 100 ug via INTRAVENOUS

## 2022-06-15 MED ORDER — PANTOPRAZOLE SODIUM 40 MG IV SOLR
40.0000 mg | INTRAVENOUS | Status: DC
  Administered 2022-06-15 – 2022-06-16 (×2): 40 mg via INTRAVENOUS
  Filled 2022-06-15 (×2): qty 10

## 2022-06-15 MED ORDER — MIDAZOLAM HCL 5 MG/5ML IJ SOLN
INTRAMUSCULAR | Status: DC | PRN
  Administered 2022-06-15: 2 mg via INTRAVENOUS

## 2022-06-15 MED ORDER — FENTANYL CITRATE PF 50 MCG/ML IJ SOSY: 25.0000 ug | PREFILLED_SYRINGE | INTRAMUSCULAR | Status: DC | PRN

## 2022-06-15 MED ORDER — SUGAMMADEX SODIUM 500 MG/5ML IV SOLN
INTRAVENOUS | Status: DC | PRN
  Administered 2022-06-15: 424.4 mg via INTRAVENOUS

## 2022-06-15 MED ORDER — FENTANYL CITRATE (PF) 250 MCG/5ML IJ SOLN
INTRAMUSCULAR | Status: AC
  Filled 2022-06-15: qty 5

## 2022-06-15 MED ORDER — SODIUM CHLORIDE 0.9% FLUSH: 10.0000 mL | INTRAVENOUS | Status: DC | PRN

## 2022-06-15 MED ORDER — LIDOCAINE HCL (PF) 2 % IJ SOLN
INTRAMUSCULAR | Status: DC | PRN
  Administered 2022-06-15 (×2): 100 mg via INTRADERMAL

## 2022-06-15 MED ORDER — FAMOTIDINE 20 MG PO TABS: 20.0000 mg | ORAL_TABLET | ORAL | Status: DC

## 2022-06-15 MED ORDER — DEXMEDETOMIDINE HCL IN NACL 400 MCG/100ML IV SOLN
0.0000 ug/kg/h | INTRAVENOUS | Status: DC
  Administered 2022-06-15: 0.4 ug/kg/h via INTRAVENOUS
  Administered 2022-06-15: 1.1 ug/kg/h via INTRAVENOUS
  Filled 2022-06-15: qty 100

## 2022-06-15 MED ORDER — DOCUSATE SODIUM 50 MG/5ML PO LIQD: 100.0000 mg | Freq: Two times a day (BID) | ORAL | Status: DC

## 2022-06-15 MED ORDER — ORAL CARE MOUTH RINSE
15.0000 mL | OROMUCOSAL | Status: DC
  Administered 2022-06-15 – 2022-06-16 (×9): 15 mL via OROMUCOSAL

## 2022-06-15 MED ORDER — DEXMEDETOMIDINE HCL IN NACL 80 MCG/20ML IV SOLN
INTRAVENOUS | Status: DC | PRN
  Administered 2022-06-15: 8 ug via BUCCAL

## 2022-06-15 MED ORDER — PENTAFLUOROPROP-TETRAFLUOROETH EX AERO: 1.0000 | INHALATION_SPRAY | CUTANEOUS | Status: DC | PRN

## 2022-06-15 MED ORDER — 0.9 % SODIUM CHLORIDE (POUR BTL) OPTIME
TOPICAL | Status: DC | PRN
  Administered 2022-06-15: 1000 mL

## 2022-06-15 MED ORDER — SODIUM CHLORIDE 0.9% FLUSH
10.0000 mL | Freq: Two times a day (BID) | INTRAVENOUS | Status: DC
  Administered 2022-06-15 – 2022-06-16 (×2): 10 mL

## 2022-06-15 MED ORDER — DOCUSATE SODIUM 50 MG/5ML PO LIQD
100.0000 mg | Freq: Two times a day (BID) | ORAL | Status: DC
  Administered 2022-06-15 – 2022-06-16 (×3): 100 mg
  Filled 2022-06-15 (×3): qty 10

## 2022-06-15 MED ORDER — FENTANYL 2500MCG IN NS 250ML (10MCG/ML) PREMIX INFUSION
0.0000 ug/h | INTRAVENOUS | Status: DC
  Administered 2022-06-15: 50 ug/h via INTRAVENOUS
  Filled 2022-06-15: qty 250

## 2022-06-15 MED ORDER — ROCURONIUM BROMIDE 10 MG/ML (PF) SYRINGE
PREFILLED_SYRINGE | INTRAVENOUS | Status: DC | PRN
  Administered 2022-06-15: 50 mg via INTRAVENOUS

## 2022-06-15 MED ORDER — KETAMINE HCL 10 MG/ML IJ SOLN
INTRAMUSCULAR | Status: DC | PRN
  Administered 2022-06-15: 10 mg via INTRAVENOUS
  Administered 2022-06-15: 20 mg via INTRAVENOUS

## 2022-06-15 MED ORDER — CHLORHEXIDINE GLUCONATE 0.12 % MT SOLN
15.0000 mL | Freq: Once | OROMUCOSAL | Status: AC
  Administered 2022-06-15: 15 mL via OROMUCOSAL

## 2022-06-15 MED ORDER — SODIUM CHLORIDE 0.9 % IV SOLN: INTRAVENOUS | Status: DC

## 2022-06-15 MED ORDER — AMIODARONE LOAD VIA INFUSION
150.0000 mg | Freq: Once | INTRAVENOUS | Status: AC
  Administered 2022-06-15: 150 mg via INTRAVENOUS
  Filled 2022-06-15: qty 83.34

## 2022-06-15 MED ORDER — MIDAZOLAM HCL 2 MG/2ML IJ SOLN
INTRAMUSCULAR | Status: AC
  Filled 2022-06-15: qty 2

## 2022-06-15 MED ORDER — DEXAMETHASONE SODIUM PHOSPHATE 10 MG/ML IJ SOLN
INTRAMUSCULAR | Status: AC
  Filled 2022-06-15: qty 1

## 2022-06-15 MED ORDER — LIDOCAINE HCL (PF) 1 % IJ SOLN: 5.0000 mL | INTRAMUSCULAR | Status: DC | PRN

## 2022-06-15 MED ORDER — AMIODARONE HCL IN DEXTROSE 360-4.14 MG/200ML-% IV SOLN
60.0000 mg/h | INTRAVENOUS | Status: AC
  Administered 2022-06-15 (×2): 60 mg/h via INTRAVENOUS
  Filled 2022-06-15 (×2): qty 200

## 2022-06-15 MED ORDER — ROCURONIUM BROMIDE 10 MG/ML (PF) SYRINGE
PREFILLED_SYRINGE | INTRAVENOUS | Status: AC
  Filled 2022-06-15: qty 10

## 2022-06-15 MED ORDER — POLYETHYLENE GLYCOL 3350 17 G PO PACK
17.0000 g | PACK | Freq: Every day | ORAL | Status: DC
  Administered 2022-06-16: 17 g
  Filled 2022-06-15: qty 1

## 2022-06-15 MED ORDER — KETAMINE HCL 50 MG/5ML IJ SOSY
PREFILLED_SYRINGE | INTRAMUSCULAR | Status: AC
  Filled 2022-06-15: qty 5

## 2022-06-15 MED ORDER — DEXMEDETOMIDINE HCL IN NACL 400 MCG/100ML IV SOLN
INTRAVENOUS | Status: AC
  Filled 2022-06-15: qty 100

## 2022-06-15 MED ORDER — POLYETHYLENE GLYCOL 3350 17 G PO PACK: 17.0000 g | PACK | Freq: Every day | ORAL | Status: DC

## 2022-06-15 MED ORDER — PROPOFOL 10 MG/ML IV BOLUS
INTRAVENOUS | Status: DC | PRN
  Administered 2022-06-15: 20 mg via INTRAVENOUS

## 2022-06-15 MED ORDER — BUPIVACAINE HCL (PF) 0.5 % IJ SOLN
INTRAMUSCULAR | Status: DC | PRN
  Administered 2022-06-15: 30 mL

## 2022-06-15 SURGICAL SUPPLY — 20 items
COVER LIGHT HANDLE STERIS (MISCELLANEOUS) ×4 IMPLANT
DECANTER SPIKE VIAL GLASS SM (MISCELLANEOUS) ×2 IMPLANT
ELECT REM PT RETURN 9FT ADLT (ELECTROSURGICAL) ×1
ELECTRODE REM PT RTRN 9FT ADLT (ELECTROSURGICAL) ×2 IMPLANT
GAUZE 4X4 16PLY ~~LOC~~+RFID DBL (SPONGE) IMPLANT
GAUZE SPONGE 4X4 12PLY STRL (GAUZE/BANDAGES/DRESSINGS) ×2 IMPLANT
GLOVE BIOGEL PI IND STRL 7.0 (GLOVE) ×4 IMPLANT
GLOVE SURG SS PI 7.5 STRL IVOR (GLOVE) ×4 IMPLANT
GOWN STRL REUS W/TWL LRG LVL3 (GOWN DISPOSABLE) ×4 IMPLANT
KIT TURNOVER KIT A (KITS) ×2 IMPLANT
MANIFOLD NEPTUNE II (INSTRUMENTS) ×2 IMPLANT
NDL HYPO 21X1.5 SAFETY (NEEDLE) IMPLANT
NEEDLE HYPO 21X1.5 SAFETY (NEEDLE) ×1 IMPLANT
NS IRRIG 1000ML POUR BTL (IV SOLUTION) ×2 IMPLANT
PACK PERI GYN (CUSTOM PROCEDURE TRAY) ×2 IMPLANT
PACKING GAUZE IODOFORM 1INX5YD (GAUZE/BANDAGES/DRESSINGS) IMPLANT
PAD ABD 5X9 TENDERSORB (GAUZE/BANDAGES/DRESSINGS) IMPLANT
PAD ARMBOARD 7.5X6 YLW CONV (MISCELLANEOUS) ×2 IMPLANT
SET BASIN LINEN APH (SET/KITS/TRAYS/PACK) ×2 IMPLANT
SYR 30ML LL (SYRINGE) IMPLANT

## 2022-06-15 NOTE — Procedures (Signed)
  HEMODIALYSIS TREATMENT NOTE:   Treatment initiation was delayed due to difficult cannulation of AVF.  3.5 hour treatment was completed.  Unable to tolerate any UF>300 ml/h due to hypotension.  Levophed was titrated from 7 to 8 mcg/m for further BP support.  All blood was returned and hemostasis was achieved in 30 minutes.  Net UF 400 ml.   Rockwell Alexandria, RN AP [facility]

## 2022-06-15 NOTE — Progress Notes (Signed)
PROGRESS NOTE  Justin [name] KGY:[id number] DOB: Feb 08, 1946 DOA: 06/12/2022 PCP: Michell Heinrich, DO  Brief History:  76 year old male with a history of ESRD (MWF) atrial fibrillation/atrial flutter, hypertension, hyperlipidemia, prediabetes, tobacco abuse in remission presenting with 1 month history of buttock pain, right greater than left.  Patient stated that it started as a boil that subsequently burst.  After testing, he began noticing worsening pain in his perianal region with sitting.  It has progressively gotten worse.  He denies any fevers, chills, chest pain, shortness of breath, coughing, hemoptysis, nausea, vomiting, direct abdominal pain.  He has not been on any recent antibiotics.  Because of his worsening pain, the patient presented for further evaluation and treatment.Marland Kitchen He last had dialysis on 06/12/2022.  In the ED, the patient had a low-grade temperature 100.2 F.  He was initially hypotensive with a blood pressure of 86/43.  WBC 17.2, hemoglobin 10.8, platelets 199,000.  Sodium 140, potassium 3.5, bicarbonate 32, serum creatinine 5.32.  CT of the abdomen and pelvis showed a multiloculated/fluid collection and gas collection in the perianal region from the perineum to the inferior buttocks bilaterally.  The patient was started on vancomycin and Zosyn.  General surgery was consulted to assist with management.  The patient's apixaban was held in preparation for surgery.   Assessment/Plan: Severe sepsis -Secondary to perirectal abscess -Presented with tachycardia, leukocytosis, and lactate 2.7 -Initially required peripheral Levophed for short period of time -06/14/22 arterial line inserted -06/14/22  CVL inserted -levophed 8-9 mcg/kg/min -Continue vancomycin and Zosyn -start low dose midodrine   Perirectal abscess -General surgery consulted -Case discussed with Dr. Aviva Signs 12/3 -restart apixaban when ok with surgery -Echocardiogram EF 60-65%, low normal RVF, mod  TR -I&D on 06/15/2022  Wide Complex tachycardia -?Vtach -Wide complex tachcyardia with thready pulse, received 15 seconds of CPR. Another short 5 second episode while in OR -appreciate cardiology--discussed with Dr. Harl Bowie -on amio drip now  Acute respiratory Failure with hypoxia -left intubated post-op -personally reviewed CXR 12/4--no edema or infiltrates -Dr. Melvyn Novas consulted   ESRD -He dialyzes on Monday Wednesday Friday -Last dialysis 06/12/2022 -Nephrology consulted for next dialysis -Continue Velphoro and Sensipar -HD on 12/4 prior to    Atrial flutter -Holding apixaban in preparation for surgery -Continue amiodarone gtt   Mixed hyperlipidemia -Continue statin   Essential hypertension -Holding metoprolol secondary to hypotension   Obesity -BMI 33.58 -lifestyle modification         Family Communication: no  Family at bedside   Consultants:  renal, general surgery, cardiology, PCCM   Code Status:  FULL    DVT Prophylaxis:  apixaban on hold     Procedures: As Listed in Progress Note Above   Antibiotics: Vanc 12/1>> Zosyn 12/1>>    Subjective: ROS not possible.  Patient is intubated and sedated.  Objective: Vitals:   06/15/22 1545 06/15/22 1600 06/15/22 1615 06/15/22 1630  BP: (!) 107/34 (!) 135/40 (!) 143/47 (!) 151/38  Pulse: 79 74    Resp: '18 15 18 18  '$ Temp:      TempSrc:      SpO2: 90% (!) 87%    Weight:      Height:        Intake/Output Summary (Last 24 hours) at 06/15/2022 1639 Last data filed at 06/15/2022 1606 Gross per 24 hour  Intake 1328.52 ml  Output 450 ml  Net 878.52 ml   Weight change:  Exam:  General:  Pt  is intubated and sedated HEENT: No icterus, No thrush, No neck mass, [place]/AT Cardiovascular: IRRR, S1/S2, no rubs, no gallops Respiratory: bibasilar rales.  No wheeze Abdomen: Soft/+BS, non tender, non distended, no guarding Extremities: No edema, No lymphangitis, No petechiae, No rashes, no synovitis   Data  Reviewed: I have personally reviewed following labs and imaging studies Basic Metabolic Panel: Recent Labs  Lab 06/12/22 1832 06/13/22 0451 06/14/22 0313 06/15/22 0356  NA 140 138 134* 136  K 3.5 3.6 3.8 4.4  CL 95* 96* 93* 94*  CO2 32 '30 27 26  '$ GLUCOSE 154* 164* 289* 196*  BUN 21 29* 42* 55*  CREATININE 5.32* 6.37* 8.39* 10.39*  CALCIUM 8.6* 8.3* 7.9* 8.3*  MG  --  2.0  --   --   PHOS  --  4.6 3.8 5.1*   Liver Function Tests: Recent Labs  Lab 06/13/22 0451 06/14/22 0313 06/15/22 0356  AST 20  --   --   ALT 14  --   --   ALKPHOS 65  --   --   BILITOT 0.8  --   --   PROT 7.0  --   --   ALBUMIN 2.6* 2.5* 2.6*   No results for input(s): "LIPASE", "AMYLASE" in the last 168 hours. No results for input(s): "AMMONIA" in the last 168 hours. Coagulation Profile: No results for input(s): "INR", "PROTIME" in the last 168 hours. CBC: Recent Labs  Lab 06/12/22 1832 06/13/22 0451 06/14/22 0313 06/15/22 0356  WBC 17.2* 17.5* 24.3* 25.3*  NEUTROABS 14.7*  --   --   --   HGB 10.8* 9.7* 9.8* 9.5*  HCT 34.4* 31.3* 31.1* 30.2*  MCV 100.6* 100.3* 98.4 98.7  PLT 209 199 231 237   Cardiac Enzymes: No results for input(s): "CKTOTAL", "CKMB", "CKMBINDEX", "TROPONINI" in the last 168 hours. BNP: Invalid input(s): "POCBNP" CBG: Recent Labs  Lab 06/15/22 1216  GLUCAP 137*   HbA1C: No results for input(s): "HGBA1C" in the last 72 hours. Urine analysis:    Component Value Date/Time   COLORURINE AMBER (A) 05/27/2020 1451   APPEARANCEUR CLOUDY (A) 05/27/2020 1451   LABSPEC 1.017 05/27/2020 1451   PHURINE 6.0 05/27/2020 1451   GLUCOSEU 50 (A) 05/27/2020 1451   HGBUR LARGE (A) 05/27/2020 1451   BILIRUBINUR NEGATIVE 05/27/2020 1451   KETONESUR NEGATIVE 05/27/2020 1451   PROTEINUR 100 (A) 05/27/2020 1451   UROBILINOGEN 0.2 08/02/2010 1534   NITRITE NEGATIVE 05/27/2020 1451   LEUKOCYTESUR SMALL (A) 05/27/2020 1451   Sepsis  Labs: '@LABRCNTIP'$ (procalcitonin:4,lacticidven:4) ) Recent Results (from the past 240 hour(s))  MRSA Next Gen by PCR, Nasal     Status: None   Collection Time: 06/13/22  1:30 AM   Specimen: Nasal Mucosa; Nasal Swab  Result Value Ref Range Status   MRSA by PCR Next Gen NOT DETECTED NOT DETECTED Final    Comment: (NOTE) The GeneXpert MRSA Assay (FDA approved for NASAL specimens only), is one component of a comprehensive MRSA colonization surveillance program. It is not intended to diagnose MRSA infection nor to guide or monitor treatment for MRSA infections. Test performance is not FDA approved in patients less than 37 years old. Performed at [facility], [address], [place],  [address]      Scheduled Meds:  Chlorhexidine Gluconate Cloth  6 each Topical Daily   Chlorhexidine Gluconate Cloth  6 each Topical Q0600   docusate  100 mg Per Tube BID   famotidine  20 mg Per Tube Q24H  polyethylene glycol  17 g Per Tube Daily   sodium chloride flush  10-40 mL Intracatheter Q12H   sodium chloride flush  10-40 mL Intracatheter Q12H   sucroferric oxyhydroxide  500 mg Oral BID WC   Continuous Infusions:  sodium chloride     sodium chloride     amiodarone 60 mg/hr (06/15/22 1606)   amiodarone     dexmedetomidine (PRECEDEX) IV infusion 0.9 mcg/kg/hr (06/15/22 1606)   norepinephrine (LEVOPHED) Adult infusion 15 mcg/min (06/15/22 1638)   piperacillin-tazobactam (ZOSYN) 2.25 g in sodium chloride 0.9 % 50 mL IVPB Stopped (06/15/22 1422)   vancomycin 200 mL/hr at 06/15/22 1139    Procedures/Studies: DG Chest Port 1 View  Result Date: 06/15/2022 CLINICAL DATA:  ETT placement EXAM: PORTABLE CHEST 1 VIEW COMPARISON:  Chest x-ray dated July 18th 2021 FINDINGS: ETT tip is proximally 4.5 cm from the carina. Cardiac and mediastinal contours within normal limits. Left perihilar opacities and right mid lung linear opacities, likely due to atelectasis. No pleural effusion or pneumothorax.  IMPRESSION: Left perihilar opacities and right mid lung linear opacities, likely due to atelectasis. Electronically Signed   By: Yetta Glassman M.D.   On: 06/15/2022 15:01   ECHOCARDIOGRAM COMPLETE  Result Date: 06/13/2022    ECHOCARDIOGRAM REPORT   Patient Name:   Justin [name] Date of Exam: 06/13/2022 Medical Rec #:  [id number]      Height:       70.0 in Accession #:    [id number]     Weight:       234.0 lb Date of Birth:  August 01, 1945      BSA:          2.231 m Patient Age:    83 years       BP:           98/51 mmHg Patient Gender: M              HR:           109 bpm. Exam Location:  Inpatient Procedure: 2D Echo, Color Doppler, Cardiac Doppler and Intracardiac            Opacification Agent Indications:    Atrial Fibrillation I48.91  History:        Patient has prior history of Echocardiogram examinations, most                 recent 01/30/2020. Aortic Valve Disease, Arrythmias:Atrial                 Fibrillation; Risk Factors:Hypertension and Dyslipidemia. End                 stage renal disease. Severe sepsis.  Sonographer:    Darlina Sicilian RDCS Referring Phys: [phone] [facility]  Sonographer Comments: Suboptimal apical window, suboptimal subcostal window, suboptimal parasternal window and Technically difficult study due to poor echo windows. IMPRESSIONS  1. Left ventricular ejection fraction, by estimation, is 60 to 65%. The left ventricle has normal function. Left ventricular diastolic parameters are indeterminate.  2. Right ventricular systolic function is low normal. The right ventricular size is mildly enlarged. There is moderately elevated pulmonary artery systolic pressure.  3. The mitral valve is grossly normal. No evidence of mitral valve regurgitation. No evidence of mitral stenosis.  4. Tricuspid valve regurgitation is moderate.  5. Poor acoustic windows limit study Assessment of AV also limited by atrial fibrillation     AV is thickened, calcified Peak and mean gradients through the valve  are 39  and 21 mm Hg Dimensionless index is 0.33     Overall ocnsistent with moderate AS. COmpared to echo report from 2021, mean gradient is increaed. The aortic valve is calcified. There is mild calcification of the aortic valve. Aortic valve regurgitation is not visualized. Moderate aortic valve stenosis. FINDINGS  Left Ventricle: Left ventricular ejection fraction, by estimation, is 60 to 65%. The left ventricle has normal function. Definity contrast agent was given IV to delineate the left ventricular endocardial borders. The left ventricular internal cavity size was normal in size. There is no left ventricular hypertrophy. Left ventricular diastolic parameters are indeterminate. Right Ventricle: The right ventricular size is mildly enlarged. No increase in right ventricular wall thickness. Right ventricular systolic function is low normal. There is moderately elevated pulmonary artery systolic pressure. The tricuspid regurgitant  velocity is 3.30 m/s, and with an assumed right atrial pressure of 8 mmHg, the estimated right ventricular systolic pressure is 46.6 mmHg. Left Atrium: Left atrial size was normal in size. Right Atrium: Right atrial size was normal in size. Pericardium: There is no evidence of pericardial effusion. Mitral Valve: The mitral valve is grossly normal. No evidence of mitral valve regurgitation. No evidence of mitral valve stenosis. Tricuspid Valve: The tricuspid valve is normal in structure. Tricuspid valve regurgitation is moderate . No evidence of tricuspid stenosis. Aortic Valve: Poor acoustic windows limit study Assessment of AV also limited by atrial fibrillation AV is thickened, calcified Peak and mean gradients through the valve are 39 and 21 mm Hg Dimensionless index is 0.33 Overall ocnsistent with moderate AS. COmpared to echo report from 2021, mean gradient is increaed. The aortic valve is calcified. There is mild calcification of the aortic valve. Aortic valve regurgitation is not  visualized. Moderate aortic stenosis is present. Aortic valve mean gradient measures 28.0 mmHg. Aortic valve peak gradient measures 44.6 mmHg. Aortic valve area, by VTI measures 1.03 cm. Pulmonic Valve: The pulmonic valve was grossly normal. Pulmonic valve regurgitation is not visualized. No evidence of pulmonic stenosis. Aorta: The aortic root and ascending aorta are structurally normal, with no evidence of dilitation. IAS/Shunts: No atrial level shunt detected by color flow Doppler.  LEFT VENTRICLE PLAX 2D LVIDd:         4.40 cm   Diastology LVIDs:         2.50 cm   LV e' medial:    10.55 cm/s LV PW:         0.90 cm   LV E/e' medial:  12.9 LV IVS:        0.90 cm   LV e' lateral:   13.05 cm/s LVOT diam:     2.00 cm   LV E/e' lateral: 10.5 LV SV:         65 LV SV Index:   29 LVOT Area:     3.14 cm  RIGHT VENTRICLE RV S prime:     13.20 cm/s TAPSE (M-mode): 2.2 cm LEFT ATRIUM           Index LA diam:      4.10 cm 1.84 cm/m LA Vol (A4C): 66.4 ml 29.76 ml/m  AORTIC VALVE AV Area (Vmax):    0.98 cm AV Area (Vmean):   1.03 cm AV Area (VTI):     1.03 cm AV Vmax:           334.00 cm/s AV Vmean:          254.000 cm/s AV VTI:  0.634 m AV Peak Grad:      44.6 mmHg AV Mean Grad:      28.0 mmHg LVOT Vmax:         104.00 cm/s LVOT Vmean:        83.500 cm/s LVOT VTI:          0.207 m LVOT/AV VTI ratio: 0.33  AORTA Ao Root diam: 3.40 cm Ao Asc diam:  3.70 cm MITRAL VALVE                TRICUSPID VALVE MV Area (PHT): 6.83 cm     TR Peak grad:   43.6 mmHg MV Decel Time: 111 msec     TR Vmax:        330.00 cm/s MV E velocity: 136.50 cm/s                             SHUNTS                             Systemic VTI:  0.21 m                             Systemic Diam: 2.00 cm Dorris Carnes MD Electronically signed by Dorris Carnes MD Signature Date/Time: 06/13/2022/2:38:06 PM    Final    CT PELVIS W CONTRAST  Result Date: 06/12/2022 CLINICAL DATA:  Left buttock abscess for 1 month EXAM: CT PELVIS WITH CONTRAST TECHNIQUE:  Multidetector CT imaging of the pelvis was performed using the standard protocol following the bolus administration of intravenous contrast. RADIATION DOSE REDUCTION: This exam was performed according to the departmental dose-optimization program which includes automated exposure control, adjustment of the mA and/or kV according to patient size and/or use of iterative reconstruction technique. CONTRAST:  143m OMNIPAQUE IOHEXOL 300 MG/ML  SOLN COMPARISON:  05/05/2022, 05/27/2020 FINDINGS: Urinary Tract: Distal ureters are unremarkable. Bladder is decompressed, limiting its evaluation. Bowel: No bowel obstruction or ileus. Normal appendix right lower quadrant. No bowel wall thickening or inflammatory change. Vascular/Lymphatic: Atherosclerosis of the aorta and its distal branches. No pathologically enlarged lymph nodes within the pelvis. Reproductive: Prostate is enlarged, measuring 5.5 x 4.3 cm. Fiduciary markers are seen within the prostate. Other: There is no free fluid or free intraperitoneal gas. Fat containing umbilical hernia is noted. No bowel herniation. Musculoskeletal: Multilocular gas and fluid collections are seen within the buttocks, consistent with perianal abscesses. The largest gas and fluid collection within the right gluteal region measures 6.3 x 2.7 cm, reference image 50/2. This extends anteriorly into the perineum. More inferiorly within the buttocks are multilocular fluid collections, measuring up to 2.9 x 2.5 cm on the right and 3.8 x 2.5 cm on the left. I do not see any direct communication with the bowel lumen to suggest fistula. IMPRESSION: 1. Multilocular gas and fluid collection within the perianal region, extending from the perineum into the bilateral inferior buttocks, consistent with abscess. I do not see any communication between the gas and fluid collections and the bowel lumen. 2. Enlarged prostate. 3.  Aortic Atherosclerosis (ICD10-I70.0). Electronically Signed   By: MRanda Ngo M.D.   On: 06/12/2022 20:01    DOrson Eva DO  [facility]  If 7PM-7AM, please contact night-coverage [email or web address] Password [email or web address] 06/15/2022, 4:39 PM   LOS: 3 days

## 2022-06-15 NOTE — Progress Notes (Signed)
Pharmacy Antibiotic Note  Justin [name] is a 76 y.o. male admitted on 06/12/2022 with  wound infection .  Pharmacy has been consulted for vanc/zosyn dosing.  ESRD pt who presented with wound infection. Vanc/zosyn ordered empirically.   Plan: Continue Vancomycin 1000 mg IV every MWF w/ HD Continue  2.25g IV q8   Height: '5\' 10"'$  (177.8 cm) Weight:  (bedscale inoperable pre-HD) IBW/kg (Calculated) : 73  Temp (24hrs), Avg:99.2 F (37.3 C), Min:98.4 F (36.9 C), Max:100.1 F (37.8 C)  Recent Labs  Lab 06/12/22 1832 06/13/22 0451 06/14/22 0313 06/15/22 0356  WBC 17.2* 17.5* 24.3* 25.3*  CREATININE 5.32* 6.37* 8.39* 10.39*     Estimated Creatinine Clearance: 7.1 mL/min (A) (by C-G formula based on SCr of 10.39 mg/dL (H)).    No Known Allergies  Antimicrobials this admission: 12/1 vanc>> 12/1 zosyn>>   Microbiology results: MRSA PCR: neg   Margot Ables, PharmD Clinical Pharmacist 06/15/2022 10:51 AM

## 2022-06-15 NOTE — Progress Notes (Signed)
Peri-op staff present at bedside to transport patient down for procedure.

## 2022-06-15 NOTE — Transfer of Care (Addendum)
Immediate Anesthesia Transfer of Care Note  Patient: Justin [name]  Procedure(s) Performed: IRRIGATION AND DEBRIDEMENT PERIRECTAL ABSCESS (Rectum)  Patient Location: ICU  Anesthesia Type:General  Level of Consciousness: sedated and Patient remains intubated per anesthesia plan  Airway & Oxygen Therapy: Patient remains intubated per anesthesia plan and Patient placed on Ventilator (see vital sign flow sheet for setting)  Post-op Assessment: Report given to RN, Post -op Vital signs reviewed and stable, and Patient moving all extremities X 4  Post vital signs: Reviewed and stable  Last Vitals:  Vitals Value Taken Time  BP 131/37 06/15/22 1441  Temp 98.9   Pulse 102 06/15/22 1430  Resp 0 06/15/22 1444  SpO2 95 % 06/15/22 1430  Vitals shown include unvalidated device data.  Last Pain:  Vitals:   06/15/22 1210  TempSrc: Oral  PainSc: 5       Patients Stated Pain Goal: 4 (19/50/93 2671)  Complications: Patient transported to ICU on full monitors with ambu attached to 100% oxygen.  Bedside report to ICU team.  VSS on transfer of care.

## 2022-06-15 NOTE — Procedures (Signed)
I was present at this dialysis session. I have reviewed the session itself and made appropriate changes.   2K bath UF goal probably will be < 1L.  On some NE.  For OR later today for perirectal abscess.  Pt tolerating tx well.     Filed Weights   06/12/22 1700  Weight: 106.1 kg    Recent Labs  Lab 06/15/22 0356  NA 136  K 4.4  CL 94*  CO2 26  GLUCOSE 196*  BUN 55*  CREATININE 10.39*  CALCIUM 8.3*  PHOS 5.1*    Recent Labs  Lab 06/12/22 1832 06/13/22 0451 06/14/22 0313 06/15/22 0356  WBC 17.2* 17.5* 24.3* 25.3*  NEUTROABS 14.7*  --   --   --   HGB 10.8* 9.7* 9.8* 9.5*  HCT 34.4* 31.3* 31.1* 30.2*  MCV 100.6* 100.3* 98.4 98.7  PLT 209 199 231 237    Scheduled Meds:  amiodarone  200 mg Oral Daily   atorvastatin  40 mg Oral Daily   Chlorhexidine Gluconate Cloth  6 each Topical Daily   Chlorhexidine Gluconate Cloth  6 each Topical Q0600   cinacalcet  30 mg Oral Q supper   midodrine  5 mg Oral Q8H   pantoprazole  40 mg Oral Daily   sodium chloride flush  10-40 mL Intracatheter Q12H   sucroferric oxyhydroxide  500 mg Oral BID WC   Continuous Infusions:  sodium chloride     sodium chloride     norepinephrine (LEVOPHED) Adult infusion 8 mcg/min (06/15/22 0848)   piperacillin-tazobactam (ZOSYN) 2.25 g in sodium chloride 0.9 % 50 mL IVPB 2.25 g (06/15/22 0614)   PRN Meds:.Place/Maintain arterial line **AND** sodium chloride, acetaminophen **OR** acetaminophen, HYDROcodone-acetaminophen, ondansetron **OR** ondansetron (ZOFRAN) IV, sodium chloride flush   Pearson Grippe  MD 06/15/2022, 9:09 AM

## 2022-06-15 NOTE — Plan of Care (Signed)

## 2022-06-15 NOTE — Consult Note (Signed)
NAME:  Justin [name], MRN:  [id number], DOB:  09/16/1945, LOS: 3 ADMISSION DATE:  06/12/2022, CONSULTATION DATE:  12/4 REFERRING MD:  Tat , CHIEF COMPLAINT:  vent dep post op    History of Present Illness:  76yo male remote smoker with ESRF afib s/p arrest perioperatively for rectal abscess pm 12/4 and PCCM consulted at that point/ already being followed by cards and neprhrology   Pertinent  Medical History   76 y.o. male with medical history significant of ESRD on HD (MWF), atrial fibrillation on Eliquis, GERD, hyperlipidemia, hypertension who presened to the emergency department 12/1 due to 1 month of progressive pain in the buttock.  Patient states that it started with a boil and that after the boil bursts, he started to feel pain when he tried to sit, t  progressively worsened since onset of symptoms, so he decided to go to the ED for further evaluation and management.  warm compresses used at home did not relieve the symptoms.        ED Course:  In the emergency department, temperature was 100.2 F, he was tachycardic and tachypneic and BP was soft at 99/50, but O2 sat was normal at 97% on room air. Workup in the ED: CBC: WBC 17.2, hemoglobin 10.8, hematocrit 34.4, MCV 100.6, platelets 209 BMP: Sodium 140, potassium 3.5, chloride 95, bicarb 32, glucose 154, BUN 21, creatinine 5.32, EGFR 10 CT pelvis with contrast showed multilocular  gas and fluid collection within the perianal region, extending from the perineum into the bilateral inferior buttocks, consistent with abscess. He was treated with IV Vanco and Zosyn.  General surgery (Dr. Arnoldo Morale) was consulted and recommended admitting patient with plan to consult on patient in the morning.  Significant Hospital Events: Including procedures, antibiotic start and stop dates in addition to other pertinent events   12/3 Incision and drainage of perirectal abscess  12/3R Fem CVL 12/3 R RA line   Scheduled Meds:  Chlorhexidine Gluconate  Cloth  6 each Topical Daily   Chlorhexidine Gluconate Cloth  6 each Topical Q0600   docusate  100 mg Per Tube BID   famotidine  20 mg Per Tube Q24H   mouth rinse  15 mL Mouth Rinse Q2H   polyethylene glycol  17 g Per Tube Daily   sodium chloride flush  10-40 mL Intracatheter Q12H   sodium chloride flush  10-40 mL Intracatheter Q12H   sucroferric oxyhydroxide  500 mg Oral BID WC   Continuous Infusions:  sodium chloride     sodium chloride     amiodarone 60 mg/hr (06/15/22 1606)   amiodarone     dexmedetomidine (PRECEDEX) IV infusion 0.9 mcg/kg/hr (06/15/22 1606)   norepinephrine (LEVOPHED) Adult infusion 15 mcg/min (06/15/22 1638)   piperacillin-tazobactam (ZOSYN) 2.25 g in sodium chloride 0.9 % 50 mL IVPB Stopped (06/15/22 1422)   vancomycin 200 mL/hr at 06/15/22 1139   PRN Meds:.Place/Maintain arterial line **AND** sodium chloride, acetaminophen **OR** acetaminophen, fentaNYL (SUBLIMAZE) injection, fentaNYL (SUBLIMAZE) injection, HYDROcodone-acetaminophen, lidocaine (PF), lidocaine-prilocaine, ondansetron **OR** ondansetron (ZOFRAN) IV, mouth rinse, pentafluoroprop-tetrafluoroeth, sodium chloride flush, sodium chloride flush     Interim History / Subjective:  Sedated not breathing over backup rate still on levophed   Objective   Blood pressure (!) 135/40, pulse 74, temperature 98.6 F (37 C), temperature source Oral, resp. rate 15, height 5' 10" (1.778 m), weight 106.1 kg, SpO2 (!) 87 %.    Vent Mode: PRVC FiO2 (%):  [50 %] 50 % Set Rate:  [36  bmp] 18 bmp Vt Set:  [580 mL] 580 mL PEEP:  [5 cmH20] 5 cmH20 Plateau Pressure:  [27 cmH20] 27 cmH20   Intake/Output Summary (Last 24 hours) at 06/15/2022 1619 Last data filed at 06/15/2022 1606 Gross per 24 hour  Intake 1328.52 ml  Output 450 ml  Net 878.52 ml   Filed Weights   06/12/22 1700 06/15/22 1210  Weight: 106.1 kg 106.1 kg    Examination: Tmax:  100.1 General appearance:    sedated/ intubated/ comfortable appearing on  vent   No jvd Oropharynx et in place Neck supple Lungs with distant bs  bilaterally RRR no s3 or or sign murmur Abd soft with limited  excursion  Extr warm with no edema or clubbing noted Neuro  Sensorium sedated ,  no apparent motor deficits when lighter per nursing    I personally reviewed images and agree with radiology impression as follows:  CXR:   portabl 12/4 Left perihilar opacities and right mid lung linear opacities, likely due to atelectasis. ET ok   Assessment & Plan:  1) vent dep s/p arrest perioperatively for perirectal abscess with septic shock >>> keep sedated overnight then try wean /extubate in am if tolerates and not evolving ARDS  2)   Sepsis/ on approp abx s/p ID 12/4  >>> wean pressors as tolerated/ rx volume per [facility]/ nephrology  >>> track PCTs   Best Practice (right click and "Reselect all SmartList Selections" daily)   Per [facility]   Labs   CBC: Recent Labs  Lab 06/12/22 1832 06/13/22 0451 06/14/22 0313 06/15/22 0356  WBC 17.2* 17.5* 24.3* 25.3*  NEUTROABS 14.7*  --   --   --   HGB 10.8* 9.7* 9.8* 9.5*  HCT 34.4* 31.3* 31.1* 30.2*  MCV 100.6* 100.3* 98.4 98.7  PLT 209 199 231 237    Basic Metabolic Panel: Recent Labs  Lab 06/12/22 1832 06/13/22 0451 06/14/22 0313 06/15/22 0356  NA 140 138 134* 136  K 3.5 3.6 3.8 4.4  CL 95* 96* 93* 94*  CO2 32 30 27 26  GLUCOSE 154* 164* 289* 196*  BUN 21 29* 42* 55*  CREATININE 5.32* 6.37* 8.39* 10.39*  CALCIUM 8.6* 8.3* 7.9* 8.3*  MG  --  2.0  --   --   PHOS  --  4.6 3.8 5.1*   GFR: Estimated Creatinine Clearance: 7.1 mL/min (A) (by C-G formula based on SCr of 10.39 mg/dL (H)). Recent Labs  Lab 06/12/22 1832 06/13/22 0451 06/14/22 0313 06/15/22 0356  WBC 17.2* 17.5* 24.3* 25.3*    Liver Function Tests: Recent Labs  Lab 06/13/22 0451 06/14/22 0313 06/15/22 0356  AST 20  --   --   ALT 14  --   --   ALKPHOS 65  --   --   BILITOT 0.8  --   --   PROT 7.0  --   --   ALBUMIN 2.6*  2.5* 2.6*   No results for input(s): "LIPASE", "AMYLASE" in the last 168 hours. No results for input(s): "AMMONIA" in the last 168 hours.  ABG    Component Value Date/Time   TCO2 29 08/02/2010 1554     Coagulation Profile: No results for input(s): "INR", "PROTIME" in the last 168 hours.  Cardiac Enzymes: No results for input(s): "CKTOTAL", "CKMB", "CKMBINDEX", "TROPONINI" in the last 168 hours.  HbA1C: Hgb A1c MFr Bld  Date/Time Value Ref Range Status  01/28/2020 09:51 AM 6.1 (H) 4.8 - 5.6 % Final    Comment:    (  NOTE) Pre diabetes:          5.7%-6.4%  Diabetes:              >6.4%  Glycemic control for   <7.0% adults with diabetes   07/11/2011 06:29 AM 5.9 (H) <5.7 % Final    Comment:    (NOTE)                                                                       According to the ADA Clinical Practice Recommendations for 2011, when HbA1c is used as a screening test:  >=6.5%   Diagnostic of Diabetes Mellitus           (if abnormal result is confirmed) 5.7-6.4%   Increased risk of developing Diabetes Mellitus References:Diagnosis and Classification of Diabetes Mellitus,Diabetes Care,2011,34(Suppl 1):S62-S69 and Standards of Medical Care in         Diabetes - 2011,Diabetes Care,2011,34 (Suppl 1):S11-S61.    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   Surgical History:   Past Surgical History:  Procedure Laterality Date   A/V FISTULAGRAM Left 08/17/2017   Procedure: A/V FISTULAGRAM;  Surgeon: Schnier,  Gregory G, MD;  Location: [facility] INVASIVE CV LAB;  Service: Cardiovascular;  Laterality: Left;   A/V SHUNT INTERVENTION N/A 08/17/2017   Procedure: A/V SHUNT INTERVENTION;  Surgeon: Schnier, Gregory G, MD;  Location: [facility] INVASIVE CV LAB;  Service: Cardiovascular;  Laterality: N/A;   AV FISTULA PLACEMENT Left 05/31/2015   Procedure: ARTERIOVENOUS (AV) FISTULA CREATION;  Surgeon: Gregory G Schnier, MD;  Location: [facility] ORS;  Service: Vascular;  Laterality: Left;   excision bone spurs Bilateral 1989   feet   KNEE SURGERY Left 1998   arthroscopy   SHOULDER SURGERY Left 1994   rotator cuff     Social History:   reports that he quit smoking about 43 years ago. His smoking use included cigarettes. He has never used smokeless tobacco. He reports that he does not drink alcohol and does not use drugs.   Family History:  His family history includes Alcoholism in his brother and father; Kidney disease in his mother.   Allergies No Known Allergies   Home Medications  Prior to Admission medications   Medication Sig Start Date End Date Taking? Authorizing Provider  allopurinol (ZYLOPRIM) 100 MG tablet Take 100 mg by mouth daily. 09/13/21  Yes [provider]  amiodarone (PACERONE) 200 MG tablet Take 200 mg by mouth daily. 03/18/22  Yes [provider]  atorvastatin (LIPITOR) 40 MG tablet Take 40 mg by mouth daily.  10/30/14  Yes [provider]  Doxercalciferol (HECTOROL IV) Doxercalciferol (Hectorol) 08/25/21 08/24/22 Yes [provider]  ELIQUIS 5 MG TABS tablet Take 5 mg by mouth 2 (two) times daily. 10/07/19  Yes [provider]  LOKELMA 10 g PACK packet Take 10   g by mouth 2 (two) times a week. 06/08/22  Yes [provider]  Methoxy PEG-Epoetin Beta (MIRCERA IJ) Mircera 06/13/21 12/18/22 Yes [provider]  omeprazole (PRILOSEC) 20 MG capsule Take 20 mg by mouth every morning.    Yes [provider]  VELPHORO 500 MG chewable tablet Chew  500 mg by mouth 2 (two) times daily. 06/03/22  Yes [provider]  allopurinol (ZYLOPRIM) 300 MG tablet Take 300 mg by mouth daily. Patient not taking: Reported on 05/12/2022 10/13/19   [provider]  carvedilol (COREG) 25 MG tablet Take by mouth. Patient not taking: Reported on 06/14/2022 04/17/10   [provider]  cinacalcet (SENSIPAR) 30 MG tablet  05/06/22   [provider]  HYDROcodone-acetaminophen (NORCO/VICODIN) 5-325 MG tablet Take 1 tablet by mouth every 6 (six) hours as needed for severe pain. Patient not taking: Reported on 06/14/2022 05/05/22   Idol, Julie, PA-C      The patient is critically ill with multiple organ systems failure and requires high complexity decision making for assessment and support, frequent evaluation and titration of therapies, application of advanced monitoring technologies and extensive interpretation of multiple databases. Critical Care Time devoted to patient care services described in this note is 40  minutes.    , MD Pulmonary and Critical Care Medicine [facility] Cell [phone]   After 7:00 pm call Elink  [phone]       

## 2022-06-15 NOTE — Interval H&P Note (Signed)
History and Physical Interval Note:  06/15/2022 12:39 PM  Justin [name]  has presented today for surgery, with the diagnosis of perirectal abscess.  The various methods of treatment have been discussed with the patient and family. After consideration of risks, benefits and other options for treatment, the patient has consented to  Procedure(s): IRRIGATION AND DEBRIDEMENT PERIRECTAL ABSCESS (N/A) as a surgical intervention.  The patient's history has been reviewed, patient examined, no change in status, stable for surgery.  I have reviewed the patient's chart and labs.  Questions were answered to the patient's satisfaction.     Aviva Signs

## 2022-06-15 NOTE — Anesthesia Preprocedure Evaluation (Addendum)
Anesthesia Evaluation  Patient identified by MRN, date of birth, ID band Patient awake    Reviewed: Allergy & Precautions, NPO status , Patient's Chart, lab work & pertinent test results  Airway Mallampati: III  TM Distance: >3 FB Neck ROM: Full    Dental  (+) Dental Advisory Given, Missing   Pulmonary shortness of breath and with exertion, former smoker   + rhonchi  + decreased breath sounds      Cardiovascular Exercise Tolerance: Poor hypertension, Pt. on medications + DOE  + dysrhythmias (PVCs) Atrial Fibrillation + Valvular Problems/Murmurs AS  Rhythm:Irregular Rate:Tachycardia + Systolic murmurs 1. Left ventricular ejection fraction, by estimation, is 60 to 65%. The  left ventricle has normal function. Left ventricular diastolic parameters  are indeterminate.   2. Right ventricular systolic function is low normal. The right  ventricular size is mildly enlarged. There is moderately elevated  pulmonary artery systolic pressure.   3. The mitral valve is grossly normal. No evidence of mitral valve  regurgitation. No evidence of mitral stenosis.   4. Tricuspid valve regurgitation is moderate.   5. Poor acoustic windows limit study Assessment of AV also limited by  atrial fibrillation      AV is thickened, calcified Peak and mean gradients through the valve  are 39 and 21 mm Hg Dimensionless index is 0.33      Overall ocnsistent with moderate AS. COmpared to echo report from  2021, mean gradient is increaed. The aortic valve is calcified. There is  mild calcification of the aortic valve. Aortic valve regurgitation is not  visualized. Moderate aortic valve  stenosis.   PVCs in 3 lead EKG     Neuro/Psych negative neurological ROS  negative psych ROS   GI/Hepatic ,GERD  Medicated and Controlled,,  Endo/Other  diabetes, Well Controlled, Type 2, Oral Hypoglycemic Agents, Insulin Dependent    Renal/GU ESRF and  DialysisRenal disease     Musculoskeletal  (+) Arthritis , Osteoarthritis,  Chronic back pain   Abdominal   Peds  Hematology  (+) Blood dyscrasia, anemia   Anesthesia Other Findings ON OXYGEN 2L/MT  Reproductive/Obstetrics                             Anesthesia Physical Anesthesia Plan  ASA: 4  Anesthesia Plan: General   Post-op Pain Management: Dilaudid IV   Induction: Intravenous and Rapid sequence  PONV Risk Score and Plan: 3 and Ondansetron and Dexamethasone  Airway Management Planned: Oral ETT  Additional Equipment:   Intra-op Plan:   Post-operative Plan: Extubation in OR and Possible Post-op intubation/ventilation  Informed Consent: I have reviewed the patients History and Physical, chart, labs and discussed the procedure including the risks, benefits and alternatives for the proposed anesthesia with the patient or authorized representative who has indicated his/her understanding and acceptance.     Dental advisory given  Plan Discussed with: CRNA and Surgeon  Anesthesia Plan Comments:         Anesthesia Quick Evaluation

## 2022-06-15 NOTE — Anesthesia Procedure Notes (Signed)
Procedure Name: Intubation Date/Time: 06/15/2022 1:27 PM  Performed by: Sherian Maroon, CRNAPre-anesthesia Checklist: Patient identified, Emergency Drugs available, Suction available and Patient being monitored Patient Re-evaluated:Patient Re-evaluated prior to induction Oxygen Delivery Method: Circle system utilized Preoxygenation: Pre-oxygenation with 100% oxygen Induction Type: IV induction Ventilation: Mask ventilation without difficulty Laryngoscope Size: Mac and 4 Grade View: Grade I Tube type: Oral Tube size: 8.0 mm Number of attempts: 1 Airway Equipment and Method: Stylet and Oral airway Placement Confirmation: ETT inserted through vocal cords under direct vision, positive ETCO2 and breath sounds checked- equal and bilateral Secured at: 23 cm Tube secured with: Tape Dental Injury: Teeth and Oropharynx as per pre-operative assessment

## 2022-06-15 NOTE — Anesthesia Postprocedure Evaluation (Signed)
Anesthesia Post Note  Patient: Justin [name]  Procedure(s) Performed: IRRIGATION AND DEBRIDEMENT PERIRECTAL ABSCESS (Rectum)  Patient location during evaluation: ICU Anesthesia Type: General Level of consciousness: sedated and awake Pain management: pain level controlled Vital Signs Assessment: vitals unstable Respiratory status: respiratory function unstable Cardiovascular status: unstable Postop Assessment: no apparent nausea or vomiting Anesthetic complications: no  No notable events documented.   Last Vitals:  Vitals:   06/15/22 1210 06/15/22 1500  BP: (!) 143/78 (!) 102/17  Pulse:    Resp: 20 11  Temp: 37 C   SpO2:      Last Pain:  Vitals:   06/15/22 1210  TempSrc: Oral  PainSc: 5                  Reginna Sermeno C Debbora Ang

## 2022-06-15 NOTE — Op Note (Signed)
Patient:  Justin [name]  DOB:  August 16, 1945  MRN:  [id number]   Preop Diagnosis: Perirectal abscess  Postop Diagnosis: Same  Procedure: Incision and drainage of perirectal abscess  Surgeon: Aviva Signs, MD  Anes: General endotracheal  Indications: Patient is a 76 year old black male with multiple medical problems including end-stage renal disease, hypertension, history of atrial fibrillation on anticoagulation who presented to [facility] with worsening perirectal pain.  He was found to have a large perirectal abscess.  He was started on IV antibiotics and Levophed.  We had to wait to perform the surgery until his Eliquis had gotten out of his system.  The risks and benefits of the procedure including bleeding, infection, and the possibility of cardiopulmonary difficulties were fully explained to the patient, who gave informed consent.  Procedure note: The patient was placed in the lithotomy position after induction of general endotracheal anesthesia.  The perineum was prepped and draped using the usual sterile technique with Betadine.  Surgical site confirmation was performed.  The incision was made along the fluctuant area at the 9 o'clock position of the perineal area just outside the anus.  A significant amount of purulent fluid was drained.  Anaerobic and aerobic cultures were taken.  The abscess continued in a circumferential horseshoe manner anteriorly and deep to the sphincter mechanism to the 3 o'clock position.  A counterincision was made in this area.  The perirectal region was then copiously irrigated normal saline.  I used my finger to break up any loculations that were present.  There appeared to be no connection into the rectum.  Iodoform Nu Gauze was placed into both wounds.  1/2% Sensorcaine was instilled into the surrounding wound.  A dry sterile dressing was then applied.  All tape and needle counts were correct at the end of the procedure.  The patient was then  transferred to the regular bed intubated.  At that time, he went into rapid V. tach at a rate of 170.  The pulse at the femoral level was thready.  I then began CPR for about 15 seconds.  Converted back to atrial fibrillation.  He did this again and CPR was resumed for approximately 5 seconds.  His blood pressure was 130/65 on 10 mics of Levophed.  He is being transported back to the ICU in critical condition.     Complications: Postoperative V. tach  EBL: Minimal  Specimen: Aerobic and anaerobic cultures of perirectal abscess

## 2022-06-15 NOTE — Consult Note (Signed)
Cardiology Consultation   Patient ID: Justin [name] MRN: [id number]; DOB: 01-03-1946  Admit date: 06/12/2022 Date of Consult: 06/15/2022  PCP:  Michell Heinrich, [place] Providers Cardiologist:  None        Patient Profile:   Justin [name] is a 76 y.o. male with a hx of ESRD, HTN, HL, HFpEF who is being seen 06/15/2022 for the evaluation of wide complex tachycardia at the request of Dr Tat.  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   Patient had perirectal abscess I&D today. Post procedure per report episode of VT at 170 thready pulse, received CPR x 15 seconds and converted back to afib. Another short 5 second episode  with brief CPR, rapid resumption of pulse. Given IV lidocaine by anesthesia, and started on IV amio gtt.    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 ([place]) 12/07/2019   Dialysis patient [facility])    Gout    Hypertension    Right posterior capsular opacification 02/27/2021   Vitreous hemorrhage of right eye ([place]) 10/24/2019    Past Surgical History:  Procedure Laterality Date   A/V FISTULAGRAM Left 08/17/2017   Procedure: A/V FISTULAGRAM;  Surgeon: Katha Cabal, MD;  Location: [facility];  Service: Cardiovascular;  Laterality: Left;   A/V SHUNT INTERVENTION N/A  08/17/2017   Procedure: A/V SHUNT INTERVENTION;  Surgeon: Katha Cabal, MD;  Location: [facility];  Service: Cardiovascular;  Laterality: N/A;   AV FISTULA PLACEMENT Left 05/31/2015   Procedure: ARTERIOVENOUS (AV) FISTULA CREATION;  Surgeon: Katha Cabal, MD;  Location: [facility] ORS;  Service: Vascular;  Laterality: Left;   excision bone spurs Bilateral 1989   feet   KNEE SURGERY Left 1998   arthroscopy   SHOULDER SURGERY Left 1994   rotator cuff       Inpatient Medications: Scheduled Meds:  amiodarone  200 mg Oral Daily   Chlorhexidine Gluconate Cloth  6 each Topical Daily   Chlorhexidine Gluconate Cloth  6 each Topical Q0600   docusate  100 mg Per Tube BID   famotidine  20 mg Per Tube Q24H   polyethylene glycol  17 g Per Tube Daily   sodium chloride flush  10-40 mL Intracatheter Q12H   sodium chloride flush  10-40 mL Intracatheter Q12H   sucroferric oxyhydroxide  500 mg Oral BID WC   Continuous Infusions:  sodium chloride     sodium chloride     amiodarone 60 mg/hr (06/15/22 1445)   amiodarone     dexmedetomidine (PRECEDEX) IV infusion 0.4 mcg/kg/hr (06/15/22 1519)   norepinephrine (LEVOPHED) Adult infusion 11 mcg/min (06/15/22 1459)   piperacillin-tazobactam (ZOSYN) 2.25 g in sodium chloride 0.9 % 50 mL IVPB Stopped (  06/15/22 1422)   vancomycin 200 mL/hr at 06/15/22 1139   PRN Meds: Place/Maintain arterial line **AND** sodium chloride, acetaminophen **OR** acetaminophen, fentaNYL (SUBLIMAZE) injection, fentaNYL (SUBLIMAZE) injection, HYDROcodone-acetaminophen, lidocaine (PF), lidocaine-prilocaine, ondansetron **OR** ondansetron (ZOFRAN) IV, pentafluoroprop-tetrafluoroeth, sodium chloride flush, sodium chloride flush  Allergies:   No Known Allergies  Social History:   Social History   Socioeconomic History   Marital status: Widowed    Spouse name: Not on file   Number of children: Not on file   Years of education: Not on file   Highest education  level: Not on file  Occupational History   Not on file  Tobacco Use   Smoking status: Former    Types: Cigarettes    Quit date: 10/01/1978    Years since quitting: 43.7   Smokeless tobacco: Never  Vaping Use   Vaping Use: Never used  Substance and Sexual Activity   Alcohol use: No   Drug use: No   Sexual activity: Not on file  Other Topics Concern   Not on file  Social History Narrative   Not on file   Social Determinants of Health   Financial Resource Strain: Not on file  Food Insecurity: No Food Insecurity (06/13/2022)   Hunger [name]    Worried About Running Out of Food in the Last Year: Never true    Ran Out of Food in the Last Year: Never true  Transportation Needs: No Transportation Needs (06/13/2022)   PRAPARE - Hydrologist (Medical): No    Lack of Transportation (Non-Medical): No  Physical Activity: Not on file  Stress: Not on file  Social Connections: Not on file  Intimate Partner Violence: Not At Risk (06/13/2022)   Humiliation, Afraid, Rape, and Kick questionnaire    Fear of Current or Ex-Partner: No    Emotionally Abused: No    Physically Abused: No    Sexually Abused: No    Family History:    Family History  Problem Relation Age of Onset   Kidney disease Mother    Alcoholism Father    Alcoholism Brother      ROS:  Please see the history of present illness.   All other ROS reviewed and negative.     Physical Exam/Data:   Vitals:   06/15/22 1200 06/15/22 1210 06/15/22 1431 06/15/22 1500  BP: (!) 145/79 (!) 143/78  (!) 102/17  Pulse:      Resp: (!) '29 20  11  '$ Temp:  98.6 F (37 C)    TempSrc:  Oral    SpO2:      Weight:  106.1 kg    Height:  '5\' 10"'$  (1.778 m) '5\' 10"'$  (1.778 m)     Intake/Output Summary (Last 24 hours) at 06/15/2022 1534 Last data filed at 06/15/2022 1353 Gross per 24 hour  Intake 887.51 ml  Output 450 ml  Net 437.51 ml      06/15/2022   12:10 PM 06/12/2022    5:00 PM 05/12/2022    1:50  PM  Last 3 Weights  Weight (lbs) 234 lb 234 lb 234 lb  Weight (kg) 106.142 kg 106.142 kg 106.142 kg     Body mass index is 33.58 kg/m.  General:  Well nourished, well developed, in no acute distress HEENT: normal Neck: no JVD Vascular: No carotid bruits; Distal pulses 2+ bilaterally Cardiac:  irreg, 2/6 systolic murmur rusb, no jvd Lungs:  coarse bilaterally Abd: soft, nontender, no hepatomegaly  Ext: no edema Musculoskeletal:  No deformities, BUE and BLE strength normal and equal Skin: warm and dry  Neuro:  CNs 2-12 intact, no focal abnormalities noted Psych:  Normal affect     Laboratory Data:  High Sensitivity Troponin:  No results for input(s): "TROPONINIHS" in the last 720 hours.   Chemistry Recent Labs  Lab 06/13/22 0451 06/14/22 0313 06/15/22 0356  NA 138 134* 136  K 3.6 3.8 4.4  CL 96* 93* 94*  CO2 '30 27 26  '$ GLUCOSE 164* 289* 196*  BUN 29* 42* 55*  CREATININE 6.37* 8.39* 10.39*  CALCIUM 8.3* 7.9* 8.3*  MG 2.0  --   --   GFRNONAA 8* 6* 5*  ANIONGAP 12 14 16*    Recent Labs  Lab 06/13/22 0451 06/14/22 0313 06/15/22 0356  PROT 7.0  --   --   ALBUMIN 2.6* 2.5* 2.6*  AST 20  --   --   ALT 14  --   --   ALKPHOS 65  --   --   BILITOT 0.8  --   --    Lipids No results for input(s): "CHOL", "TRIG", "HDL", "LABVLDL", "LDLCALC", "CHOLHDL" in the last 168 hours.  Hematology Recent Labs  Lab 06/13/22 0451 06/14/22 0313 06/15/22 0356  WBC 17.5* 24.3* 25.3*  RBC 3.12* 3.16* 3.06*  HGB 9.7* 9.8* 9.5*  HCT 31.3* 31.1* 30.2*  MCV 100.3* 98.4 98.7  MCH 31.1 31.0 31.0  MCHC 31.0 31.5 31.5  RDW 14.4 14.3 14.2  PLT 199 231 237   Thyroid No results for input(s): "TSH", "FREET4" in the last 168 hours.  BNPNo results for input(s): "BNP", "PROBNP" in the last 168 hours.  DDimer No results for input(s): "DDIMER" in the last 168 hours.   Radiology/Studies:  DG Chest Port 1 View  Result Date: 06/15/2022 CLINICAL DATA:  ETT placement EXAM: PORTABLE CHEST 1  VIEW COMPARISON:  Chest x-ray dated July 18th 2021 FINDINGS: ETT tip is proximally 4.5 cm from the carina. Cardiac and mediastinal contours within normal limits. Left perihilar opacities and right mid lung linear opacities, likely due to atelectasis. No pleural effusion or pneumothorax. IMPRESSION: Left perihilar opacities and right mid lung linear opacities, likely due to atelectasis. Electronically Signed   By: Yetta Glassman M.D.   On: 06/15/2022 15:01   ECHOCARDIOGRAM COMPLETE  Result Date: 06/13/2022    ECHOCARDIOGRAM REPORT   Patient Name:   Justin [name] Date of Exam: 06/13/2022 Medical Rec #:  [id number]      Height:       70.0 in Accession #:    [id number]     Weight:       234.0 lb Date of Birth:  10-24-1945      BSA:          2.231 m Patient Age:    25 years       BP:           98/51 mmHg Patient Gender: M              HR:           109 bpm. Exam Location:  Inpatient Procedure: 2D Echo, Color Doppler, Cardiac Doppler and Intracardiac            Opacification Agent Indications:    Atrial Fibrillation I48.91  History:        Patient has prior history of Echocardiogram examinations, most                 recent 01/30/2020.  Aortic Valve Disease, Arrythmias:Atrial                 Fibrillation; Risk Factors:Hypertension and Dyslipidemia. End                 stage renal disease. Severe sepsis.  Sonographer:    Darlina Sicilian RDCS Referring Phys: [phone] [facility]  Sonographer Comments: Suboptimal apical window, suboptimal subcostal window, suboptimal parasternal window and Technically difficult study due to poor echo windows. IMPRESSIONS  1. Left ventricular ejection fraction, by estimation, is 60 to 65%. The left ventricle has normal function. Left ventricular diastolic parameters are indeterminate.  2. Right ventricular systolic function is low normal. The right ventricular size is mildly enlarged. There is moderately elevated pulmonary artery systolic pressure.  3. The mitral valve is grossly normal. No  evidence of mitral valve regurgitation. No evidence of mitral stenosis.  4. Tricuspid valve regurgitation is moderate.  5. Poor acoustic windows limit study Assessment of AV also limited by atrial fibrillation     AV is thickened, calcified Peak and mean gradients through the valve are 39 and 21 mm Hg Dimensionless index is 0.33     Overall ocnsistent with moderate AS. COmpared to echo report from 2021, mean gradient is increaed. The aortic valve is calcified. There is mild calcification of the aortic valve. Aortic valve regurgitation is not visualized. Moderate aortic valve stenosis. FINDINGS  Left Ventricle: Left ventricular ejection fraction, by estimation, is 60 to 65%. The left ventricle has normal function. Definity contrast agent was given IV to delineate the left ventricular endocardial borders. The left ventricular internal cavity size was normal in size. There is no left ventricular hypertrophy. Left ventricular diastolic parameters are indeterminate. Right Ventricle: The right ventricular size is mildly enlarged. No increase in right ventricular wall thickness. Right ventricular systolic function is low normal. There is moderately elevated pulmonary artery systolic pressure. The tricuspid regurgitant  velocity is 3.30 m/s, and with an assumed right atrial pressure of 8 mmHg, the estimated right ventricular systolic pressure is 85.2 mmHg. Left Atrium: Left atrial size was normal in size. Right Atrium: Right atrial size was normal in size. Pericardium: There is no evidence of pericardial effusion. Mitral Valve: The mitral valve is grossly normal. No evidence of mitral valve regurgitation. No evidence of mitral valve stenosis. Tricuspid Valve: The tricuspid valve is normal in structure. Tricuspid valve regurgitation is moderate . No evidence of tricuspid stenosis. Aortic Valve: Poor acoustic windows limit study Assessment of AV also limited by atrial fibrillation AV is thickened, calcified Peak and mean  gradients through the valve are 39 and 21 mm Hg Dimensionless index is 0.33 Overall ocnsistent with moderate AS. COmpared to echo report from 2021, mean gradient is increaed. The aortic valve is calcified. There is mild calcification of the aortic valve. Aortic valve regurgitation is not visualized. Moderate aortic stenosis is present. Aortic valve mean gradient measures 28.0 mmHg. Aortic valve peak gradient measures 44.6 mmHg. Aortic valve area, by VTI measures 1.03 cm. Pulmonic Valve: The pulmonic valve was grossly normal. Pulmonic valve regurgitation is not visualized. No evidence of pulmonic stenosis. Aorta: The aortic root and ascending aorta are structurally normal, with no evidence of dilitation. IAS/Shunts: No atrial level shunt detected by color flow Doppler.  LEFT VENTRICLE PLAX 2D LVIDd:         4.40 cm   Diastology LVIDs:         2.50 cm   LV e' medial:    10.55 cm/s  LV PW:         0.90 cm   LV E/e' medial:  12.9 LV IVS:        0.90 cm   LV e' lateral:   13.05 cm/s LVOT diam:     2.00 cm   LV E/e' lateral: 10.5 LV SV:         65 LV SV Index:   29 LVOT Area:     3.14 cm  RIGHT VENTRICLE RV S prime:     13.20 cm/s TAPSE (M-mode): 2.2 cm LEFT ATRIUM           Index LA diam:      4.10 cm 1.84 cm/m LA Vol (A4C): 66.4 ml 29.76 ml/m  AORTIC VALVE AV Area (Vmax):    0.98 cm AV Area (Vmean):   1.03 cm AV Area (VTI):     1.03 cm AV Vmax:           334.00 cm/s AV Vmean:          254.000 cm/s AV VTI:            0.634 m AV Peak Grad:      44.6 mmHg AV Mean Grad:      28.0 mmHg LVOT Vmax:         104.00 cm/s LVOT Vmean:        83.500 cm/s LVOT VTI:          0.207 m LVOT/AV VTI ratio: 0.33  AORTA Ao Root diam: 3.40 cm Ao Asc diam:  3.70 cm MITRAL VALVE                TRICUSPID VALVE MV Area (PHT): 6.83 cm     TR Peak grad:   43.6 mmHg MV Decel Time: 111 msec     TR Vmax:        330.00 cm/s MV E velocity: 136.50 cm/s                             SHUNTS                             Systemic VTI:  0.21 m                              Systemic Diam: 2.00 cm Dorris Carnes MD Electronically signed by Dorris Carnes MD Signature Date/Time: 06/13/2022/2:38:06 PM    Final    CT PELVIS W CONTRAST  Result Date: 06/12/2022 CLINICAL DATA:  Left buttock abscess for 1 month EXAM: CT PELVIS WITH CONTRAST TECHNIQUE: Multidetector CT imaging of the pelvis was performed using the standard protocol following the bolus administration of intravenous contrast. RADIATION DOSE REDUCTION: This exam was performed according to the departmental dose-optimization program which includes automated exposure control, adjustment of the mA and/or kV according to patient size and/or use of iterative reconstruction technique. CONTRAST:  132m OMNIPAQUE IOHEXOL 300 MG/ML  SOLN COMPARISON:  05/05/2022, 05/27/2020 FINDINGS: Urinary Tract: Distal ureters are unremarkable. Bladder is decompressed, limiting its evaluation. Bowel: No bowel obstruction or ileus. Normal appendix right lower quadrant. No bowel wall thickening or inflammatory change. Vascular/Lymphatic: Atherosclerosis of the aorta and its distal branches. No pathologically enlarged lymph nodes within the pelvis. Reproductive: Prostate is enlarged, measuring 5.5 x 4.3 cm. Fiduciary markers are seen within the prostate. Other: There is no free  fluid or free intraperitoneal gas. Fat containing umbilical hernia is noted. No bowel herniation. Musculoskeletal: Multilocular gas and fluid collections are seen within the buttocks, consistent with perianal abscesses. The largest gas and fluid collection within the right gluteal region measures 6.3 x 2.7 cm, reference image 50/2. This extends anteriorly into the perineum. More inferiorly within the buttocks are multilocular fluid collections, measuring up to 2.9 x 2.5 cm on the right and 3.8 x 2.5 cm on the left. I do not see any direct communication with the bowel lumen to suggest fistula. IMPRESSION: 1. Multilocular gas and fluid collection within the perianal region,  extending from the perineum into the bilateral inferior buttocks, consistent with abscess. I do not see any communication between the gas and fluid collections and the bowel lumen. 2. Enlarged prostate. 3.  Aortic Atherosclerosis (ICD10-I70.0). Electronically Signed   By: Randa Ngo M.D.   On: 06/12/2022 20:01     Assessment and Plan:   1.Wide complex tachycardia - episode per notes, no strips available and thus cannot confirm exact rhythm or duration. Wide complex tachcyardia with thready pulse, received 15 seconds of CPR. Another short 5 second episode. This was in the postop setting of perirectal I&D in setting of septic shock on levophed at 10.  - from tele review in ICU has had some NSVT during admission even prior to surgery, up to 16 seconds. Post procedure monitoring with some PVCs that have all but resolved on amio gtt.   - post episode EKG afib without acute ischemic changes - preop echo LVEF 60-65%, no WMAs - continue amio gtt overnight, monitor rhythms and if stable transition back to his oral amio dose. Suspect related to sepsis, anesthesia, pressors in postop period given the rapid stabilization of rhythm. If recurrences may warrant further cardiac testing. If significant recurrencs overnight would change levophed to phenlyephrine and can rebolus amio - Keep K at 4, Mg at 2.    2.Aflutter - followed [facility] EP, has been amio and eliquis -  from notes low bp's on av nodal agents, particularly during HD    For questions or updates, please contact [place] Please consult [email or web address] for contact info under    Signed, Carlyle Dolly, MD  06/15/2022 3:34 PM

## 2022-06-16 ENCOUNTER — Inpatient Hospital Stay (HOSPITAL_COMMUNITY): Payer: Medicare Other

## 2022-06-16 DIAGNOSIS — K610 Anal abscess: Secondary | ICD-10-CM | POA: Diagnosis not present

## 2022-06-16 DIAGNOSIS — E669 Obesity, unspecified: Secondary | ICD-10-CM | POA: Diagnosis not present

## 2022-06-16 DIAGNOSIS — A419 Sepsis, unspecified organism: Secondary | ICD-10-CM | POA: Diagnosis not present

## 2022-06-16 DIAGNOSIS — J9602 Acute respiratory failure with hypercapnia: Secondary | ICD-10-CM | POA: Diagnosis not present

## 2022-06-16 DIAGNOSIS — J9601 Acute respiratory failure with hypoxia: Secondary | ICD-10-CM | POA: Diagnosis not present

## 2022-06-16 DIAGNOSIS — N186 End stage renal disease: Secondary | ICD-10-CM | POA: Diagnosis not present

## 2022-06-16 DIAGNOSIS — R6521 Severe sepsis with septic shock: Secondary | ICD-10-CM | POA: Diagnosis not present

## 2022-06-16 LAB — CBC
HCT: 29.4 % — ABNORMAL LOW (ref 39.0–52.0)
Hemoglobin: 9.5 g/dL — ABNORMAL LOW (ref 13.0–17.0)
MCH: 31.4 pg (ref 26.0–34.0)
MCHC: 32.3 g/dL (ref 30.0–36.0)
MCV: 97 fL (ref 80.0–100.0)
Platelets: 284 10*3/uL (ref 150–400)
RBC: 3.03 MIL/uL — ABNORMAL LOW (ref 4.22–5.81)
RDW: 14.4 % (ref 11.5–15.5)
WBC: 22.1 10*3/uL — ABNORMAL HIGH (ref 4.0–10.5)
nRBC: 0 % (ref 0.0–0.2)

## 2022-06-16 LAB — BASIC METABOLIC PANEL
Anion gap: 15 (ref 5–15)
BUN: 43 mg/dL — ABNORMAL HIGH (ref 8–23)
CO2: 24 mmol/L (ref 22–32)
Calcium: 8.8 mg/dL — ABNORMAL LOW (ref 8.9–10.3)
Chloride: 94 mmol/L — ABNORMAL LOW (ref 98–111)
Creatinine, Ser: 7.56 mg/dL — ABNORMAL HIGH (ref 0.61–1.24)
GFR, Estimated: 7 mL/min — ABNORMAL LOW (ref >60)
Glucose, Bld: 275 mg/dL — ABNORMAL HIGH (ref 70–99)
Potassium: 4.7 mmol/L (ref 3.5–5.1)
Sodium: 133 mmol/L — ABNORMAL LOW (ref 135–145)

## 2022-06-16 LAB — HEPATITIS B SURFACE ANTIBODY, QUANTITATIVE: Hep B S AB Quant (Post): 145.6 m[IU]/mL (ref >9.9)

## 2022-06-16 LAB — GLUCOSE, CAPILLARY
Glucose-Capillary: 225 mg/dL — ABNORMAL HIGH (ref 70–99)
Glucose-Capillary: 241 mg/dL — ABNORMAL HIGH (ref 70–99)
Glucose-Capillary: 263 mg/dL — ABNORMAL HIGH (ref 70–99)
Glucose-Capillary: 279 mg/dL — ABNORMAL HIGH (ref 70–99)
Glucose-Capillary: 289 mg/dL — ABNORMAL HIGH (ref 70–99)
Glucose-Capillary: 299 mg/dL — ABNORMAL HIGH (ref 70–99)
Glucose-Capillary: 341 mg/dL — ABNORMAL HIGH (ref 70–99)

## 2022-06-16 LAB — MAGNESIUM: Magnesium: 1.9 mg/dL (ref 1.7–2.4)

## 2022-06-16 LAB — PROCALCITONIN: Procalcitonin: 1.89 ng/mL

## 2022-06-16 MED ORDER — ORAL CARE MOUTH RINSE: 15.0000 mL | OROMUCOSAL | Status: DC | PRN

## 2022-06-16 MED ORDER — MENTHOL 3 MG MT LOZG
1.0000 | LOZENGE | OROMUCOSAL | Status: DC | PRN
  Administered 2022-06-16: 3 mg via ORAL
  Filled 2022-06-16: qty 9

## 2022-06-16 MED ORDER — ORAL CARE MOUTH RINSE
15.0000 mL | OROMUCOSAL | Status: DC
  Administered 2022-06-16 – 2022-06-17 (×4): 15 mL via OROMUCOSAL

## 2022-06-16 NOTE — Inpatient Diabetes Management (Signed)
Inpatient Diabetes Program Recommendations  AACE/ADA: New Consensus Statement on Inpatient Glycemic Control (2015)  Target Ranges:  Prepandial:   less than 140 mg/dL      Peak postprandial:   less than 180 mg/dL (1-2 hours)      Critically ill patients:  140 - 180 mg/dL   Lab Results  Component Value Date   GLUCAP 279 (H) 06/16/2022   HGBA1C 6.1 (H) 01/28/2020    Review of Glycemic Control  Latest Reference Range & Units 06/15/22 20:28 06/16/22 00:17 06/16/22 04:02 06/16/22 07:52  Glucose-Capillary 70 - 99 mg/dL 261 (H) 225 (H) 263 (H) 279 (H)  (H): Data is abnormally high Diabetes history: Type 2 DM Outpatient Diabetes medications: none Current orders for Inpatient glycemic control: none  Inpatient Diabetes Program Recommendations:    Given current trends, consider adding Novolog 0-6 units Q4H.   Thanks, Bronson Curb, MSN, RNC-OB Diabetes Coordinator [phone] (8a-5p)

## 2022-06-16 NOTE — Progress Notes (Signed)
NAME:  Justin [name], MRN:  [id number], DOB:  October 06, 1945, LOS: 4 ADMISSION DATE:  06/12/2022, CONSULTATION DATE:  12/4 REFERRING MD:  Tat , CHIEF COMPLAINT:  vent dep post op    History of Present Illness:  76yo male remote smoker with ESRF afib s/p arrest perioperatively for rectal abscess pm 12/4 and PCCM consulted at that point/ already being followed by cards and neprhrology   Pertinent  Medical History   76 y.o. male with medical history significant of ESRD on HD (MWF), atrial fibrillation on Eliquis, GERD, hyperlipidemia, hypertension who presened to the emergency department 12/1 due to 1 month of progressive pain in the buttock.  Patient states that it started with a boil and that after the boil bursts, he started to feel pain when he tried to sit, t  progressively worsened since onset of symptoms, so he decided to go to the ED for further evaluation and management.  warm compresses used at home did not relieve the symptoms.        ED Course:  In the emergency department, temperature was 100.2 F, he was tachycardic and tachypneic and BP was soft at 99/50, but O2 sat was normal at 97% on room air. Workup in the ED: CBC: WBC 17.2, hemoglobin 10.8, hematocrit 34.4, MCV 100.6, platelets 209 BMP: Sodium 140, potassium 3.5, chloride 95, bicarb 32, glucose 154, BUN 21, creatinine 5.32, EGFR 10 CT pelvis with contrast showed multilocular  gas and fluid collection within the perianal region, extending from the perineum into the bilateral inferior buttocks, consistent with abscess. He was treated with IV Vanco and Zosyn.  General surgery (Dr. Arnoldo Morale) was consulted and recommended admitting patient with plan to consult on patient in the morning.  Significant Hospital Events: Including procedures, antibiotic start and stop dates in addition to other pertinent events   12/3 Incision and drainage of perirectal abscess  12/3R Fem CVL 12/3 R RA line   Scheduled Meds:  Chlorhexidine Gluconate  Cloth  6 each Topical Daily   Chlorhexidine Gluconate Cloth  6 each Topical Q0600   docusate  100 mg Per Tube BID   mouth rinse  15 mL Mouth Rinse Q2H   pantoprazole (PROTONIX) IV  40 mg Intravenous Q24H   polyethylene glycol  17 g Per Tube Daily   sodium chloride flush  10-40 mL Intracatheter Q12H   sodium chloride flush  10-40 mL Intracatheter Q12H   sucroferric oxyhydroxide  500 mg Oral BID WC   Continuous Infusions:  sodium chloride     sodium chloride     amiodarone 30 mg/hr (06/16/22 0808)   fentaNYL infusion INTRAVENOUS 50 mcg/hr (06/16/22 0808)   norepinephrine (LEVOPHED) Adult infusion 6 mcg/min (06/16/22 0813)   piperacillin-tazobactam (ZOSYN) 2.25 g in sodium chloride 0.9 % 50 mL IVPB Stopped (06/16/22 0708)   vancomycin 200 mL/hr at 06/15/22 1139   PRN Meds:.Place/Maintain arterial line **AND** sodium chloride, acetaminophen **OR** acetaminophen, fentaNYL (SUBLIMAZE) injection, fentaNYL (SUBLIMAZE) injection, HYDROcodone-acetaminophen, lidocaine (PF), lidocaine-prilocaine, ondansetron **OR** ondansetron (ZOFRAN) IV, mouth rinse, pentafluoroprop-tetrafluoroeth, sodium chloride flush, sodium chloride flush     Interim History / Subjective:  Sedated on vent but eyes open, responding approp  Objective   Blood pressure (!) 107/26, pulse 74, temperature 98.1 F (36.7 C), temperature source Axillary, resp. rate 18, height _0  (1.778 m), weight 112.7 kg, SpO2 97 %.    Vent Mode: PRVC FiO2 (%):  [35 %-50 %] 35 % Set Rate:  [18 bmp] 18 bmp Vt Set:  [435  mL] 580 mL PEEP:  [5 cmH20] 5 cmH20 Plateau Pressure:  [9 cmH20-27 cmH20] 18 cmH20   Intake/Output Summary (Last 24 hours) at 06/16/2022 0856 Last data filed at 06/16/2022 7893 Gross per 24 hour  Intake 1926.59 ml  Output 450 ml  Net 1476.59 ml   Filed Weights   06/12/22 1700 06/15/22 1210 06/16/22 0427  Weight: 106.1 kg 106.1 kg 112.7 kg    Examination:  Tmax:  100.9  General appearance:    intubated/comfortable on  present vent settings   No jvd Oropharynx et Neck supple Lungs with distant BS bilaterally RRR no s3 or or sign murmur Abd obese/ soft  with nl  excursion  Extr warm with no edema or clubbing noted Neuro  Sensorium sedated,  no apparent motor deficits     I personally reviewed images /impression as follows:  CXR:   portable  12/5 Decreased aeration bases but no significant change vs 12/4     Assessment & Plan:  1) vent dep s/p arrest perioperatively for perirectal abscess with septic shock >>>  not evolving ARDS so try wean sedation /extubate today   2)   Sepsis/ on approp abx s/p ID 12/4  >>> wean pressors as tolerated/ rx volume per [facility]/ nephrology  >>> track PCTs  abx surgery   3)  Afib on amiodarone / f/u cards  4) ESRF on HD / f/u neprhology   Pccm available prn p extubation   Best Practice (right click and "Reselect all SmartList Selections" daily)   Per [facility]   Labs   CBC: Recent Labs  Lab 06/12/22 1832 06/13/22 0451 06/14/22 0313 06/15/22 0356 06/16/22 0355  WBC 17.2* 17.5* 24.3* 25.3* 22.1*  NEUTROABS 14.7*  --   --   --   --   HGB 10.8* 9.7* 9.8* 9.5* 9.5*  HCT 34.4* 31.3* 31.1* 30.2* 29.4*  MCV 100.6* 100.3* 98.4 98.7 97.0  PLT 209 199 231 237 810    Basic Metabolic Panel: Recent Labs  Lab 06/12/22 1832 06/13/22 0451 06/14/22 0313 06/15/22 0356 06/16/22 0355  NA 140 138 134* 136 133*  K 3.5 3.6 3.8 4.4 4.7  CL 95* 96* 93* 94* 94*  CO2 32 _0 GLUCOSE 154* 164* 289* 196* 275*  BUN 21 29* 42* 55* 43*  CREATININE 5.32* 6.37* 8.39* 10.39* 7.56*  CALCIUM 8.6* 8.3* 7.9* 8.3* 8.8*  MG  --  2.0  --   --  1.9  PHOS  --  4.6 3.8 5.1*  --    GFR: Estimated Creatinine Clearance: 10.1 mL/min (A) (by C-G formula based on SCr of 7.56 mg/dL (H)). Recent Labs  Lab 06/13/22 0451 06/14/22 0313 06/15/22 0356 06/15/22 1910 06/16/22 0355  PROCALCITON  --   --   --  1.50 1.89  WBC 17.5* 24.3* 25.3*  --  22.1*    Liver Function  Tests: Recent Labs  Lab 06/13/22 0451 06/14/22 0313 06/15/22 0356  AST 20  --   --   ALT 14  --   --   ALKPHOS 65  --   --   BILITOT 0.8  --   --   PROT 7.0  --   --   ALBUMIN 2.6* 2.5* 2.6*   No results for input(s): "LIPASE", "AMYLASE" in the last 168 hours. No results for input(s): "AMMONIA" in the last 168 hours.  ABG    Component Value Date/Time   PHART 7.43 06/15/2022 1625   PCO2ART 43 06/15/2022 1625  PO2ART 101 06/15/2022 1625   HCO3 28.5 (H) 06/15/2022 1625   TCO2 29 08/02/2010 1554   O2SAT 98.3 06/15/2022 1625     Coagulation Profile: No results for input(s): "INR", "PROTIME" in the last 168 hours.  Cardiac Enzymes: No results for input(s): "CKTOTAL", "CKMB", "CKMBINDEX", "TROPONINI" in the last 168 hours.  HbA1C: Hgb A1c MFr Bld  Date/Time Value Ref Range Status  01/28/2020 09:51 AM 6.1 (H) 4.8 - 5.6 % Final    Comment:    (NOTE) Pre diabetes:          5.7%-6.4%  Diabetes:              >6.4%  Glycemic control for   <7.0% adults with diabetes   07/11/2011 06:29 AM 5.9 (H) <5.7 % Final    Comment:    (NOTE)                                                                       According to the ADA Clinical Practice Recommendations for 2011, when HbA1c is used as a screening test:  >=6.5%   Diagnostic of Diabetes Mellitus           (if abnormal result is confirmed) 5.7-6.4%   Increased risk of developing Diabetes Mellitus References:Diagnosis and Classification of Diabetes Mellitus,Diabetes KLKJ,1791,50(VWPVX 1):S62-S69 and Standards of Medical Care in         Diabetes - 2011,Diabetes YIAX,6553,74 (Suppl 1):S11-S61.    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   Surgical History:   Past Surgical History:  Procedure Laterality Date   A/V FISTULAGRAM Left 08/17/2017   Procedure: A/V FISTULAGRAM;  Surgeon: Katha Cabal, MD;  Location: [facility];  Service: Cardiovascular;  Laterality: Left;   A/V SHUNT INTERVENTION N/A 08/17/2017   Procedure: A/V SHUNT INTERVENTION;  Surgeon: Katha Cabal, MD;  Location: [facility];  Service: Cardiovascular;  Laterality: N/A;   AV FISTULA PLACEMENT Left 05/31/2015   Procedure: ARTERIOVENOUS (AV) FISTULA CREATION;  Surgeon: Katha Cabal, MD;  Location: [facility];  Service: Vascular;  Laterality: Left;   excision bone spurs Bilateral 1989   feet   KNEE SURGERY Left 1998   arthroscopy   SHOULDER SURGERY Left 1994   rotator cuff      The patient is critically ill with multiple organ systems failure and requires high complexity decision making for assessment and support, frequent evaluation and titration of therapies, application of advanced monitoring technologies and extensive interpretation of multiple databases. Critical Care Time devoted to patient care services described in this note is 35  minutes.   Christinia Gully, MD Pulmonary and [place] [phone]   After 7:00 pm call Elink  [phone]

## 2022-06-16 NOTE — Progress Notes (Signed)
PROGRESS NOTE  Justin [name] RKY:[id number] DOB: 30-Jun-1946 DOA: 06/12/2022 PCP: Michell Heinrich, DO   Brief History:  76 year old male with a history of ESRD (MWF) atrial fibrillation/atrial flutter, hypertension, hyperlipidemia, prediabetes, tobacco abuse in remission presenting with 1 month history of buttock pain, right greater than left.  Patient stated that it started as a boil that subsequently burst.  After testing, he began noticing worsening pain in his perianal region with sitting.  It has progressively gotten worse.  He denies any fevers, chills, chest pain, shortness of breath, coughing, hemoptysis, nausea, vomiting, direct abdominal pain.  He has not been on any recent antibiotics.  Because of his worsening pain, the patient presented for further evaluation and treatment.Marland Kitchen He last had dialysis on 06/12/2022.  In the ED, the patient had a low-grade temperature 100.2 F.  He was initially hypotensive with a blood pressure of 86/43.  WBC 17.2, hemoglobin 10.8, platelets 199,000.  Sodium 140, potassium 3.5, bicarbonate 32, serum creatinine 5.32.  CT of the abdomen and pelvis showed a multiloculated/fluid collection and gas collection in the perianal region from the perineum to the inferior buttocks bilaterally.  The patient was started on vancomycin and Zosyn.  General surgery was consulted to assist with management.  The patient's apixaban was held in preparation for surgery.   Assessment/Plan: Severe sepsis -Secondary to perirectal abscess -Presented with tachycardia, leukocytosis, and lactate 2.7 -Initially required peripheral Levophed for short period of time -06/14/22 arterial line inserted -06/14/22  CVL inserted -levophed 8-9 mcg/kg/min>>2 mcg/kg -Continue vancomycin and Zosyn  Perirectal abscess -General surgery consulted -restart apixaban when ok with surgery -Echocardiogram EF 60-65%, low normal RVF, mod TR -I&D on 06/15/2022>>GNR and GPC clusters   Wide Complex  tachycardia -?Vtach -Wide complex tachcyardia with thready pulse, received 15 seconds of CPR. Another short 5 second episode while in OR -appreciate cardiology--discussed with Dr. Harl Bowie -on amio drip now   Acute respiratory Failure with hypoxia -left intubated post-op -personally reviewed CXR 12/4--no edema or infiltrates -Dr. Melvyn Novas consulted -06/16/22--extubated -now on 3L [place] with 100% sat   ESRD -He dialyzes on Monday Wednesday Friday -Last dialysis 06/12/2022 -Nephrology consulted for next dialysis -Continue Velphoro and Sensipar -HD on 12/4 prior to surgery -next HD 12/6   Atrial flutter -Holding apixaban in preparation for surgery -Continue amiodarone gtt   Mixed hyperlipidemia -Continue statin   Essential hypertension -Holding metoprolol secondary to hypotension   Obesity -BMI 33.58 -lifestyle modification         Family Communication: no  Family at bedside   Consultants:  renal, general surgery, cardiology, PCCM   Code Status:  FULL    DVT Prophylaxis:  apixaban on hold     Procedures: As Listed in Progress Note Above   Antibiotics: Vanc 12/1>> Zosyn 12/1>>     Subjective:  Patient denies fevers, chills, headache, chest pain, dyspnea, nausea, vomiting, diarrhea, abdominal pain, dysuria, hematuria, hematochezia, and melena. He complains of rectal pain, controlled with opioids Objective: Vitals:   06/16/22 1500 06/16/22 1555 06/16/22 1600 06/16/22 1700  BP: (!) 114/56  (!) 107/23 (!) 108/48  Pulse:      Resp: '10  13 16  '$ Temp:  97.8 F (36.6 C)    TempSrc:  Oral    SpO2:  100%  100%  Weight:      Height:        Intake/Output Summary (Last 24 hours) at 06/16/2022 1746 Last data filed at 06/16/2022 1733 Gross  per 24 hour  Intake 1392.94 ml  Output --  Net 1392.94 ml   Weight change:  Exam:  General:  Pt is alert, follows commands appropriately, not in acute distress HEENT: No icterus, No thrush, No neck mass, [place]/AT Cardiovascular:  RRR, S1/S2, no rubs, no gallops Respiratory: bibasilar crackles. No wheeze Abdomen: Soft/+BS, non tender, non distended, no guarding Extremities: No edema, No lymphangitis, No petechiae, No rashes, no synovitis   Data Reviewed: I have personally reviewed following labs and imaging studies Basic Metabolic Panel: Recent Labs  Lab 06/12/22 1832 06/13/22 0451 06/14/22 0313 06/15/22 0356 06/16/22 0355  NA 140 138 134* 136 133*  K 3.5 3.6 3.8 4.4 4.7  CL 95* 96* 93* 94* 94*  CO2 32 '30 27 26 24  '$ GLUCOSE 154* 164* 289* 196* 275*  BUN 21 29* 42* 55* 43*  CREATININE 5.32* 6.37* 8.39* 10.39* 7.56*  CALCIUM 8.6* 8.3* 7.9* 8.3* 8.8*  MG  --  2.0  --   --  1.9  PHOS  --  4.6 3.8 5.1*  --    Liver Function Tests: Recent Labs  Lab 06/13/22 0451 06/14/22 0313 06/15/22 0356  AST 20  --   --   ALT 14  --   --   ALKPHOS 65  --   --   BILITOT 0.8  --   --   PROT 7.0  --   --   ALBUMIN 2.6* 2.5* 2.6*   No results for input(s): "LIPASE", "AMYLASE" in the last 168 hours. No results for input(s): "AMMONIA" in the last 168 hours. Coagulation Profile: No results for input(s): "INR", "PROTIME" in the last 168 hours. CBC: Recent Labs  Lab 06/12/22 1832 06/13/22 0451 06/14/22 0313 06/15/22 0356 06/16/22 0355  WBC 17.2* 17.5* 24.3* 25.3* 22.1*  NEUTROABS 14.7*  --   --   --   --   HGB 10.8* 9.7* 9.8* 9.5* 9.5*  HCT 34.4* 31.3* 31.1* 30.2* 29.4*  MCV 100.6* 100.3* 98.4 98.7 97.0  PLT 209 199 231 237 284   Cardiac Enzymes: No results for input(s): "CKTOTAL", "CKMB", "CKMBINDEX", "TROPONINI" in the last 168 hours. BNP: Invalid input(s): "POCBNP" CBG: Recent Labs  Lab 06/16/22 0017 06/16/22 0402 06/16/22 0752 06/16/22 1131 06/16/22 1547  GLUCAP 225* 263* 279* 241* 299*   HbA1C: No results for input(s): "HGBA1C" in the last 72 hours. Urine analysis:    Component Value Date/Time   COLORURINE AMBER (A) 05/27/2020 1451   APPEARANCEUR CLOUDY (A) 05/27/2020 1451   LABSPEC 1.017  05/27/2020 1451   PHURINE 6.0 05/27/2020 1451   GLUCOSEU 50 (A) 05/27/2020 1451   HGBUR LARGE (A) 05/27/2020 1451   BILIRUBINUR NEGATIVE 05/27/2020 1451   KETONESUR NEGATIVE 05/27/2020 1451   PROTEINUR 100 (A) 05/27/2020 1451   UROBILINOGEN 0.2 08/02/2010 1534   NITRITE NEGATIVE 05/27/2020 1451   LEUKOCYTESUR SMALL (A) 05/27/2020 1451   Sepsis Labs: '@LABRCNTIP'$ (procalcitonin:4,lacticidven:4) ) Recent Results (from the past 240 hour(s))  MRSA Next Gen by PCR, Nasal     Status: None   Collection Time: 06/13/22  1:30 AM   Specimen: Nasal Mucosa; Nasal Swab  Result Value Ref Range Status   MRSA by PCR Next Gen NOT DETECTED NOT DETECTED Final    Comment: (NOTE) The GeneXpert MRSA Assay (FDA approved for NASAL specimens only), is one component of a comprehensive MRSA colonization surveillance program. It is not intended to diagnose MRSA infection nor to guide or monitor treatment for MRSA infections. Test performance is not FDA approved  in patients less than 40 years old. Performed at [facility], [address], [place], [place] [address]   Aerobic/Anaerobic Culture w Gram Stain (surgical/deep wound)     Status: None (Preliminary result)   Collection Time: 06/15/22  1:44 PM   Specimen: PATH Other; Tissue  Result Value Ref Range Status   Specimen Description ABSCESS  Final   Special Requests PERIRECTAL  Final   Gram Stain   Final    FEW WBC PRESENT, PREDOMINANTLY PMN RARE GRAM POSITIVE COCCI IN CLUSTERS RARE GRAM POSITIVE RODS    Culture   Final    RARE GRAM NEGATIVE RODS CULTURE REINCUBATED FOR BETTER GROWTH Performed at [facility], [place] [address], [place], [place] [address]    Report Status PENDING  Incomplete     Scheduled Meds:  Chlorhexidine Gluconate Cloth  6 each Topical Daily   Chlorhexidine Gluconate Cloth  6 each Topical Q0600   docusate  100 mg Per Tube BID   mouth rinse  15 mL Mouth Rinse 4 times per day   pantoprazole (PROTONIX) IV  40 mg Intravenous  Q24H   polyethylene glycol  17 g Per Tube Daily   sodium chloride flush  10-40 mL Intracatheter Q12H   sodium chloride flush  10-40 mL Intracatheter Q12H   sucroferric oxyhydroxide  500 mg Oral BID WC   Continuous Infusions:  sodium chloride     sodium chloride     amiodarone 30 mg/hr (06/16/22 1733)   fentaNYL infusion INTRAVENOUS Stopped (06/16/22 1035)   norepinephrine (LEVOPHED) Adult infusion 2 mcg/min (06/16/22 1733)   piperacillin-tazobactam (ZOSYN) 2.25 g in sodium chloride 0.9 % 50 mL IVPB Stopped (06/16/22 1342)   vancomycin 200 mL/hr at 06/15/22 1139    Procedures/Studies: DG Chest Port 1 View  Result Date: 06/16/2022 CLINICAL DATA:  Acute respiratory failure with hypoxia and hypercapnia. Patient on vent. EXAM: PORTABLE CHEST 1 VIEW COMPARISON:  AP chest 06/15/2022 (multiple studies), chest two views 01/28/2020 FINDINGS: Endotracheal tube tip terminates approximately 6.3 cm above the carina, at the inferior aspect of the clavicular heads and similar to prior. Enteric tube descends below the diaphragm with the tip and side port overlying the left upper abdominal quadrant. Moderately decreased lung volumes. Right mid and lower lung horizontal linear densities, likely subsegmental atelectasis. No focal left lung airspace opacity. No definite pleural effusion. No pneumothorax. No acute skeletal abnormality. IMPRESSION: 1. Endotracheal tube tip terminates approximately 6.3 cm above the carina and similar to prior. 2. Enteric tube descends below the diaphragm with the tip and side port overlying the left upper abdominal quadrant. 3. Right mid and lower lung linear opacities are similar to prior, probable atelectasis. It is difficult to exclude mild pneumonia. Electronically Signed   By: Yvonne Kendall M.D.   On: 06/16/2022 09:02   DG CHEST PORT 1 VIEW  Result Date: 06/15/2022 CLINICAL DATA:  OG tube placement EXAM: PORTABLE CHEST 1 VIEW COMPARISON:  06/15/2022, 2:47 p.m. FINDINGS: Interval  placement of esophagogastric tube, tip and side port below the diaphragm. Otherwise unchanged rotated AP portable examination with endotracheal tube over the midtrachea, as well as bilateral perihilar heterogeneous and consolidative airspace opacities. No new airspace opacity. IMPRESSION: 1. Interval placement of esophagogastric tube, tip and side port below the diaphragm. 2. Otherwise unchanged rotated AP portable examination with endotracheal tube over the midtrachea, as well as bilateral perihilar heterogeneous and consolidative airspace opacities. Electronically Signed   By: Delanna Ahmadi M.D.   On: 06/15/2022 18:15  DG Chest Port 1 View  Result Date: 06/15/2022 CLINICAL DATA:  ETT placement EXAM: PORTABLE CHEST 1 VIEW COMPARISON:  Chest x-ray dated July 18th 2021 FINDINGS: ETT tip is proximally 4.5 cm from the carina. Cardiac and mediastinal contours within normal limits. Left perihilar opacities and right mid lung linear opacities, likely due to atelectasis. No pleural effusion or pneumothorax. IMPRESSION: Left perihilar opacities and right mid lung linear opacities, likely due to atelectasis. Electronically Signed   By: Yetta Glassman M.D.   On: 06/15/2022 15:01   ECHOCARDIOGRAM COMPLETE  Result Date: 06/13/2022    ECHOCARDIOGRAM REPORT   Patient Name:   SKY BORBOA Date of Exam: 06/13/2022 Medical Rec #:  [id number]      Height:       70.0 in Accession #:    [id number]     Weight:       234.0 lb Date of Birth:  04/12/1946      BSA:          2.231 m Patient Age:    2 years       BP:           98/51 mmHg Patient Gender: M              HR:           109 bpm. Exam Location:  Inpatient Procedure: 2D Echo, Color Doppler, Cardiac Doppler and Intracardiac            Opacification Agent Indications:    Atrial Fibrillation I48.91  History:        Patient has prior history of Echocardiogram examinations, most                 recent 01/30/2020. Aortic Valve Disease, Arrythmias:Atrial                  Fibrillation; Risk Factors:Hypertension and Dyslipidemia. End                 stage renal disease. Severe sepsis.  Sonographer:    Darlina Sicilian RDCS Referring Phys: [id number] [facility]  Sonographer Comments: Suboptimal apical window, suboptimal subcostal window, suboptimal parasternal window and Technically difficult study due to poor echo windows. IMPRESSIONS  1. Left ventricular ejection fraction, by estimation, is 60 to 65%. The left ventricle has normal function. Left ventricular diastolic parameters are indeterminate.  2. Right ventricular systolic function is low normal. The right ventricular size is mildly enlarged. There is moderately elevated pulmonary artery systolic pressure.  3. The mitral valve is grossly normal. No evidence of mitral valve regurgitation. No evidence of mitral stenosis.  4. Tricuspid valve regurgitation is moderate.  5. Poor acoustic windows limit study Assessment of AV also limited by atrial fibrillation     AV is thickened, calcified Peak and mean gradients through the valve are 39 and 21 mm Hg Dimensionless index is 0.33     Overall ocnsistent with moderate AS. COmpared to echo report from 2021, mean gradient is increaed. The aortic valve is calcified. There is mild calcification of the aortic valve. Aortic valve regurgitation is not visualized. Moderate aortic valve stenosis. FINDINGS  Left Ventricle: Left ventricular ejection fraction, by estimation, is 60 to 65%. The left ventricle has normal function. Definity contrast agent was given IV to delineate the left ventricular endocardial borders. The left ventricular internal cavity size was normal in size. There is no left ventricular hypertrophy. Left ventricular diastolic parameters are indeterminate. Right Ventricle: The right ventricular size is  mildly enlarged. No increase in right ventricular wall thickness. Right ventricular systolic function is low normal. There is moderately elevated pulmonary artery systolic pressure. The  tricuspid regurgitant  velocity is 3.30 m/s, and with an assumed right atrial pressure of 8 mmHg, the estimated right ventricular systolic pressure is 87.5 mmHg. Left Atrium: Left atrial size was normal in size. Right Atrium: Right atrial size was normal in size. Pericardium: There is no evidence of pericardial effusion. Mitral Valve: The mitral valve is grossly normal. No evidence of mitral valve regurgitation. No evidence of mitral valve stenosis. Tricuspid Valve: The tricuspid valve is normal in structure. Tricuspid valve regurgitation is moderate . No evidence of tricuspid stenosis. Aortic Valve: Poor acoustic windows limit study Assessment of AV also limited by atrial fibrillation AV is thickened, calcified Peak and mean gradients through the valve are 39 and 21 mm Hg Dimensionless index is 0.33 Overall ocnsistent with moderate AS. COmpared to echo report from 2021, mean gradient is increaed. The aortic valve is calcified. There is mild calcification of the aortic valve. Aortic valve regurgitation is not visualized. Moderate aortic stenosis is present. Aortic valve mean gradient measures 28.0 mmHg. Aortic valve peak gradient measures 44.6 mmHg. Aortic valve area, by VTI measures 1.03 cm. Pulmonic Valve: The pulmonic valve was grossly normal. Pulmonic valve regurgitation is not visualized. No evidence of pulmonic stenosis. Aorta: The aortic root and ascending aorta are structurally normal, with no evidence of dilitation. IAS/Shunts: No atrial level shunt detected by color flow Doppler.  LEFT VENTRICLE PLAX 2D LVIDd:         4.40 cm   Diastology LVIDs:         2.50 cm   LV e' medial:    10.55 cm/s LV PW:         0.90 cm   LV E/e' medial:  12.9 LV IVS:        0.90 cm   LV e' lateral:   13.05 cm/s LVOT diam:     2.00 cm   LV E/e' lateral: 10.5 LV SV:         65 LV SV Index:   29 LVOT Area:     3.14 cm  RIGHT VENTRICLE RV S prime:     13.20 cm/s TAPSE (M-mode): 2.2 cm LEFT ATRIUM           Index LA diam:      4.10  cm 1.84 cm/m LA Vol (A4C): 66.4 ml 29.76 ml/m  AORTIC VALVE AV Area (Vmax):    0.98 cm AV Area (Vmean):   1.03 cm AV Area (VTI):     1.03 cm AV Vmax:           334.00 cm/s AV Vmean:          254.000 cm/s AV VTI:            0.634 m AV Peak Grad:      44.6 mmHg AV Mean Grad:      28.0 mmHg LVOT Vmax:         104.00 cm/s LVOT Vmean:        83.500 cm/s LVOT VTI:          0.207 m LVOT/AV VTI ratio: 0.33  AORTA Ao Root diam: 3.40 cm Ao Asc diam:  3.70 cm MITRAL VALVE                TRICUSPID VALVE MV Area (PHT): 6.83 cm     TR Peak grad:  43.6 mmHg MV Decel Time: 111 msec     TR Vmax:        330.00 cm/s MV E velocity: 136.50 cm/s                             SHUNTS                             Systemic VTI:  0.21 m                             Systemic Diam: 2.00 cm Dorris Carnes MD Electronically signed by Dorris Carnes MD Signature Date/Time: 06/13/2022/2:38:06 PM    Final    CT PELVIS W CONTRAST  Result Date: 06/12/2022 CLINICAL DATA:  Left buttock abscess for 1 month EXAM: CT PELVIS WITH CONTRAST TECHNIQUE: Multidetector CT imaging of the pelvis was performed using the standard protocol following the bolus administration of intravenous contrast. RADIATION DOSE REDUCTION: This exam was performed according to the departmental dose-optimization program which includes automated exposure control, adjustment of the mA and/or kV according to patient size and/or use of iterative reconstruction technique. CONTRAST:  171m OMNIPAQUE IOHEXOL 300 MG/ML  SOLN COMPARISON:  05/05/2022, 05/27/2020 FINDINGS: Urinary Tract: Distal ureters are unremarkable. Bladder is decompressed, limiting its evaluation. Bowel: No bowel obstruction or ileus. Normal appendix right lower quadrant. No bowel wall thickening or inflammatory change. Vascular/Lymphatic: Atherosclerosis of the aorta and its distal branches. No pathologically enlarged lymph nodes within the pelvis. Reproductive: Prostate is enlarged, measuring 5.5 x 4.3 cm. Fiduciary markers  are seen within the prostate. Other: There is no free fluid or free intraperitoneal gas. Fat containing umbilical hernia is noted. No bowel herniation. Musculoskeletal: Multilocular gas and fluid collections are seen within the buttocks, consistent with perianal abscesses. The largest gas and fluid collection within the right gluteal region measures 6.3 x 2.7 cm, reference image 50/2. This extends anteriorly into the perineum. More inferiorly within the buttocks are multilocular fluid collections, measuring up to 2.9 x 2.5 cm on the right and 3.8 x 2.5 cm on the left. I do not see any direct communication with the bowel lumen to suggest fistula. IMPRESSION: 1. Multilocular gas and fluid collection within the perianal region, extending from the perineum into the bilateral inferior buttocks, consistent with abscess. I do not see any communication between the gas and fluid collections and the bowel lumen. 2. Enlarged prostate. 3.  Aortic Atherosclerosis (ICD10-I70.0). Electronically Signed   By: MRanda NgoM.D.   On: 06/12/2022 20:01    DOrson Eva DO  [facility]  If 7PM-7AM, please contact night-coverage [email or web address] Password [email or web address] 06/16/2022, 5:46 PM   LOS: 4 days

## 2022-06-16 NOTE — Progress Notes (Signed)
Offered patient something to drink multiple times yet patient states he doesn't want to try anything to drink yet because his throat is sore. Mouth care provided and Cepacol lozenges given to patient to help with sore throat. Will continue to monitor and offer fluids as patient tolerates.

## 2022-06-16 NOTE — Progress Notes (Addendum)
Rounding Note    Patient Name: Justin [name] Date of Encounter: 06/16/2022  [place] Cardiologist: Rob Hickman EP  Subjective   Remains intubated, sedated  Inpatient Medications    Scheduled Meds:  Chlorhexidine Gluconate Cloth  6 each Topical Daily   Chlorhexidine Gluconate Cloth  6 each Topical Q0600   docusate  100 mg Per Tube BID   mouth rinse  15 mL Mouth Rinse Q2H   pantoprazole (PROTONIX) IV  40 mg Intravenous Q24H   polyethylene glycol  17 g Per Tube Daily   sodium chloride flush  10-40 mL Intracatheter Q12H   sodium chloride flush  10-40 mL Intracatheter Q12H   sucroferric oxyhydroxide  500 mg Oral BID WC   Continuous Infusions:  sodium chloride     sodium chloride     amiodarone 30 mg/hr (06/16/22 0808)   fentaNYL infusion INTRAVENOUS 50 mcg/hr (06/16/22 0808)   norepinephrine (LEVOPHED) Adult infusion 6 mcg/min (06/16/22 0813)   piperacillin-tazobactam (ZOSYN) 2.25 g in sodium chloride 0.9 % 50 mL IVPB Stopped (06/16/22 0708)   vancomycin 200 mL/hr at 06/15/22 1139   PRN Meds: Place/Maintain arterial line **AND** sodium chloride, acetaminophen **OR** acetaminophen, fentaNYL (SUBLIMAZE) injection, fentaNYL (SUBLIMAZE) injection, HYDROcodone-acetaminophen, lidocaine (PF), lidocaine-prilocaine, ondansetron **OR** ondansetron (ZOFRAN) IV, mouth rinse, pentafluoroprop-tetrafluoroeth, sodium chloride flush, sodium chloride flush   Vital Signs    Vitals:   06/16/22 0646 06/16/22 0718 06/16/22 0747 06/16/22 0800  BP:  (!) 111/22  (!) 107/26  Pulse:      Resp: '17 18 18 18  '$ Temp:      TempSrc:      SpO2:      Weight:      Height:        Intake/Output Summary (Last 24 hours) at 06/16/2022 0817 Last data filed at 06/16/2022 0808 Gross per 24 hour  Intake 1926.59 ml  Output 450 ml  Net 1476.59 ml      06/16/2022    4:27 AM 06/15/2022   12:10 PM 06/12/2022    5:00 PM  Last 3 Weights  Weight (lbs) 248 lb 7.3 oz 234 lb 234 lb  Weight (kg) 112.7 kg  106.142 kg 106.142 kg      Telemetry    Rate controlled afib - Personally Reviewed  ECG    N/a - Personally Reviewed  Physical Exam   GEN: No acute distress.   Neck: No JVD Cardiac: irreg, 2/6 systolic murmur rusb Respiratory: coarse bilaterally GI: Soft, nontender, non-distended  MS: No edema; No deformity. Neuro:  Nonfocal  Psych: Normal affect   Labs    High Sensitivity Troponin:  No results for input(s): "TROPONINIHS" in the last 720 hours.   Chemistry Recent Labs  Lab 06/13/22 0451 06/14/22 0313 06/15/22 0356 06/16/22 0355  NA 138 134* 136 133*  K 3.6 3.8 4.4 4.7  CL 96* 93* 94* 94*  CO2 '30 27 26 24  '$ GLUCOSE 164* 289* 196* 275*  BUN 29* 42* 55* 43*  CREATININE 6.37* 8.39* 10.39* 7.56*  CALCIUM 8.3* 7.9* 8.3* 8.8*  MG 2.0  --   --  1.9  PROT 7.0  --   --   --   ALBUMIN 2.6* 2.5* 2.6*  --   AST 20  --   --   --   ALT 14  --   --   --   ALKPHOS 65  --   --   --   BILITOT 0.8  --   --   --  GFRNONAA 8* 6* 5* 7*  ANIONGAP 12 14 16* 15    Lipids No results for input(s): "CHOL", "TRIG", "HDL", "LABVLDL", "LDLCALC", "CHOLHDL" in the last 168 hours.  Hematology Recent Labs  Lab 06/14/22 0313 06/15/22 0356 06/16/22 0355  WBC 24.3* 25.3* 22.1*  RBC 3.16* 3.06* 3.03*  HGB 9.8* 9.5* 9.5*  HCT 31.1* 30.2* 29.4*  MCV 98.4 98.7 97.0  MCH 31.0 31.0 31.4  MCHC 31.5 31.5 32.3  RDW 14.3 14.2 14.4  PLT 231 237 284   Thyroid No results for input(s): "TSH", "FREET4" in the last 168 hours.  BNPNo results for input(s): "BNP", "PROBNP" in the last 168 hours.  DDimer No results for input(s): "DDIMER" in the last 168 hours.   Radiology    DG CHEST PORT 1 VIEW  Result Date: 06/15/2022 CLINICAL DATA:  OG tube placement EXAM: PORTABLE CHEST 1 VIEW COMPARISON:  06/15/2022, 2:47 p.m. FINDINGS: Interval placement of esophagogastric tube, tip and side port below the diaphragm. Otherwise unchanged rotated AP portable examination with endotracheal tube over the midtrachea,  as well as bilateral perihilar heterogeneous and consolidative airspace opacities. No new airspace opacity. IMPRESSION: 1. Interval placement of esophagogastric tube, tip and side port below the diaphragm. 2. Otherwise unchanged rotated AP portable examination with endotracheal tube over the midtrachea, as well as bilateral perihilar heterogeneous and consolidative airspace opacities. Electronically Signed   By: Delanna Ahmadi M.D.   On: 06/15/2022 18:15   DG Chest Port 1 View  Result Date: 06/15/2022 CLINICAL DATA:  ETT placement EXAM: PORTABLE CHEST 1 VIEW COMPARISON:  Chest x-ray dated July 18th 2021 FINDINGS: ETT tip is proximally 4.5 cm from the carina. Cardiac and mediastinal contours within normal limits. Left perihilar opacities and right mid lung linear opacities, likely due to atelectasis. No pleural effusion or pneumothorax. IMPRESSION: Left perihilar opacities and right mid lung linear opacities, likely due to atelectasis. Electronically Signed   By: Yetta Glassman M.D.   On: 06/15/2022 15:01    Cardiac Studies    Patient Profile  Justin [name] is a 76 y.o. male with a hx of ESRD, HTN, HL, HFpEF who is being seen 06/15/2022 for the evaluation of wide complex tachycardia at the request of Dr Tat.   Assessment & Plan    1.Wide complex tachycardia - episode per notes, no strips available and thus cannot confirm exact rhythm or duration. Wide complex tachcyardia with thready pulse, received 15 seconds of CPR. Another short 5 second episode. This was in the postop setting of perirectal I&D in setting of septic shock on levophed at 10.  - from tele review in ICU has had some NSVT during admission even prior to surgery, up to 16 seconds. Post procedure monitoring with some PVCs that have all but resolved on amio gtt.    - post episode EKG afib without acute ischemic changes - preop echo LVEF 60-65%, no WMAs  - . Suspect related to sepsis, anesthesia, pressors, in postop period given the  rapid stabilization of rhythm. If recurrences may warrant further cardiac testing. - Keep K at 4, Mg at 2.  - continue IV amio today, transition to oral when extubated. No recurrent NSVT overnight, very quite on amio gtt.     2.Aflutter - followed [facility] EP, has been amio and eliquis -  from notes low bp's on av nodal agents, particularly during HD  3. Septic shock - per primary team  4.Afib/alutter - followed by [facility] EP - has been on amio at  home. From there note low bp's on av nodal agents particularly during HD - anticoag on hold given surgery yesterday, resume when ok from surgical standpoint.   5. Remains intubated after surgery yesterday, per critical care team.   6. ESRD - HD per neprhology  7. Aortic stenosis - moderate by echo this admit, continue to monitor as outpatient.   For questions or updates, please contact [place] Please consult [email or web address] for contact info under        Signed, Carlyle Dolly, MD  06/16/2022, 8:17 AM

## 2022-06-16 NOTE — Progress Notes (Signed)
Pt extubated at 1047 by RT and attending RN was at bedside. Pt placed on 3L nasal cannula. Pt doing well at the moment, will continue to monitor.

## 2022-06-16 NOTE — Progress Notes (Signed)
Pt stated that he went down to the OR yesterday with his watch on, and it is charted in the pre-procedure checklist that "watch was sent with patient to PACU." PACU brought a watch up here and left it in patient's room, but when attending RN showed patient the watch he said that it wasn't his.  Call made to PACU to see if they had any more watches down there and they said they did not. Call made to [facility] about the issue to try and help resolve and find his watch. Stated they would come soon and try to help.

## 2022-06-16 NOTE — Plan of Care (Signed)

## 2022-06-16 NOTE — Progress Notes (Signed)
1 Day Post-Op  Subjective: Intubated but arousable.  Objective: Vital signs in last 24 hours: Temp:  [97.5 F (36.4 C)-100.9 F (38.3 C)] 97.5 F (36.4 C) (12/05 0813) Pulse Rate:  [74-101] 74 (12/04 1600) Resp:  [0-35] 16 (12/05 1002) BP: (91-158)/(17-79) 110/33 (12/05 1002) SpO2:  [86 %-100 %] 100 % (12/05 0919) FiO2 (%):  [35 %-50 %] 40 % (12/05 0919) Weight:  [106.1 kg-112.7 kg] 112.7 kg (12/05 0427) Last BM Date : 06/13/22  Intake/Output from previous day: 12/04 0701 - 12/05 0700 In: 1677 [I.V.:1266.9; NG/GT:100; IV Piggyback:310.1] Out: 450 [Blood:50] Intake/Output this shift: Total I/O In: 331.7 [I.V.:281.7; IV Piggyback:50] Out: -   Incision/Wound: Perineal packing x 2 removed.  No significant active bleeding noted.  Lab Results:  Recent Labs    06/15/22 0356 06/16/22 0355  WBC 25.3* 22.1*  HGB 9.5* 9.5*  HCT 30.2* 29.4*  PLT 237 284   BMET Recent Labs    06/15/22 0356 06/16/22 0355  NA 136 133*  K 4.4 4.7  CL 94* 94*  CO2 26 24  GLUCOSE 196* 275*  BUN 55* 43*  CREATININE 10.39* 7.56*  CALCIUM 8.3* 8.8*   PT/INR No results for input(s): "LABPROT", "INR" in the last 72 hours.  Studies/Results: DG Chest Port 1 View  Result Date: 06/16/2022 CLINICAL DATA:  Acute respiratory failure with hypoxia and hypercapnia. Patient on vent. EXAM: PORTABLE CHEST 1 VIEW COMPARISON:  AP chest 06/15/2022 (multiple studies), chest two views 01/28/2020 FINDINGS: Endotracheal tube tip terminates approximately 6.3 cm above the carina, at the inferior aspect of the clavicular heads and similar to prior. Enteric tube descends below the diaphragm with the tip and side port overlying the left upper abdominal quadrant. Moderately decreased lung volumes. Right mid and lower lung horizontal linear densities, likely subsegmental atelectasis. No focal left lung airspace opacity. No definite pleural effusion. No pneumothorax. No acute skeletal abnormality. IMPRESSION: 1. Endotracheal  tube tip terminates approximately 6.3 cm above the carina and similar to prior. 2. Enteric tube descends below the diaphragm with the tip and side port overlying the left upper abdominal quadrant. 3. Right mid and lower lung linear opacities are similar to prior, probable atelectasis. It is difficult to exclude mild pneumonia. Electronically Signed   By: Yvonne Kendall M.D.   On: 06/16/2022 09:02   DG CHEST PORT 1 VIEW  Result Date: 06/15/2022 CLINICAL DATA:  OG tube placement EXAM: PORTABLE CHEST 1 VIEW COMPARISON:  06/15/2022, 2:47 p.m. FINDINGS: Interval placement of esophagogastric tube, tip and side port below the diaphragm. Otherwise unchanged rotated AP portable examination with endotracheal tube over the midtrachea, as well as bilateral perihilar heterogeneous and consolidative airspace opacities. No new airspace opacity. IMPRESSION: 1. Interval placement of esophagogastric tube, tip and side port below the diaphragm. 2. Otherwise unchanged rotated AP portable examination with endotracheal tube over the midtrachea, as well as bilateral perihilar heterogeneous and consolidative airspace opacities. Electronically Signed   By: Delanna Ahmadi M.D.   On: 06/15/2022 18:15   DG Chest Port 1 View  Result Date: 06/15/2022 CLINICAL DATA:  ETT placement EXAM: PORTABLE CHEST 1 VIEW COMPARISON:  Chest x-ray dated July 18th 2021 FINDINGS: ETT tip is proximally 4.5 cm from the carina. Cardiac and mediastinal contours within normal limits. Left perihilar opacities and right mid lung linear opacities, likely due to atelectasis. No pleural effusion or pneumothorax. IMPRESSION: Left perihilar opacities and right mid lung linear opacities, likely due to atelectasis. Electronically Signed   By: Yetta Glassman  M.D.   On: 06/15/2022 15:01    Anti-infectives: Anti-infectives (From admission, onward)    Start     Dose/Rate Route Frequency Ordered Stop   06/15/22 1200  vancomycin (VANCOCIN) IVPB 1000 mg/200 mL premix         1,000 mg 200 mL/hr over 60 Minutes Intravenous Every M-W-F (Hemodialysis) 06/15/22 1048     06/13/22 1400  piperacillin-tazobactam (ZOSYN) 2.25 g in sodium chloride 0.9 % 50 mL IVPB        2.25 g 100 mL/hr over 30 Minutes Intravenous Every 8 hours 06/13/22 1038     06/13/22 0600  piperacillin-tazobactam (ZOSYN) IVPB 2.25 g  Status:  Discontinued        2.25 g 100 mL/hr over 30 Minutes Intravenous Every 8 hours 06/12/22 2038 06/13/22 1037   06/12/22 2045  vancomycin (VANCOREADY) IVPB 1500 mg/300 mL        1,500 mg 150 mL/hr over 120 Minutes Intravenous  Once 06/12/22 2038 06/13/22 0043   06/12/22 2045  piperacillin-tazobactam (ZOSYN) IVPB 3.375 g        3.375 g 100 mL/hr over 30 Minutes Intravenous  Once 06/12/22 2038 06/12/22 2154       Assessment/Plan: s/p Procedure(s): IRRIGATION AND DEBRIDEMENT PERIRECTAL ABSCESS Impression: Gram stain reveals gram-positive cocci and rods.  Final culture results pending.  White blood cell count has decreased slightly.  Wound overall looks good without significant purulent drainage.  Anticipate extubation later today.  Patient may start on anticoagulation orally once he is extubated.  He may be placed on regular diet.  LOS: 4 days    [facility] Signs 06/16/2022

## 2022-06-16 NOTE — Progress Notes (Signed)
Patient weaning well and able to follow commands well, Patient did a -35 on NIF and FVC 832m.and MD was made aware. At 1047hr, RT extubated patient per MD to nasal cannula. RT placed patient on 3L O2 via nasal cannula. Patient doing well , vitals remained stable throughout. Patient's HR 69, RR 15 and SATs 98%. RN at bedside, no complications noted. RT will continue to monitor and assess.

## 2022-06-16 NOTE — Progress Notes (Signed)
Admit: 06/12/2022 LOS: 4  65F ESRD MWF [place] with perianal abscess s/p I&D 12/4, septic shock, and post-op brief VTach Arrest  Subjective:  S/p I&D yesterday with Dr. Arnoldo Morale Brief VTach arrest postoperatively, ROSC with CPR Remains in ICU, intubated, but awake and interactive this AM On NE gtt K 4.7 Mg 1.9 this AM HD yesterday tricky cannulation AVF, IDH limited UF to 0.4L  12/04 0701 - 12/05 0700 In: 0938 [I.V.:1266.9; NG/GT:100; IV Piggyback:310.1] Out: 450 [Blood:50]  Filed Weights   06/12/22 1700 06/15/22 1210 06/16/22 0427  Weight: 106.1 kg 106.1 kg 112.7 kg    Scheduled Meds:  Chlorhexidine Gluconate Cloth  6 each Topical Daily   Chlorhexidine Gluconate Cloth  6 each Topical Q0600   docusate  100 mg Per Tube BID   mouth rinse  15 mL Mouth Rinse Q2H   pantoprazole (PROTONIX) IV  40 mg Intravenous Q24H   polyethylene glycol  17 g Per Tube Daily   sodium chloride flush  10-40 mL Intracatheter Q12H   sodium chloride flush  10-40 mL Intracatheter Q12H   sucroferric oxyhydroxide  500 mg Oral BID WC   Continuous Infusions:  sodium chloride     sodium chloride     amiodarone 30 mg/hr (06/16/22 0808)   fentaNYL infusion INTRAVENOUS 50 mcg/hr (06/16/22 0808)   norepinephrine (LEVOPHED) Adult infusion 6 mcg/min (06/16/22 0813)   piperacillin-tazobactam (ZOSYN) 2.25 g in sodium chloride 0.9 % 50 mL IVPB Stopped (06/16/22 0708)   vancomycin 200 mL/hr at 06/15/22 1139   PRN Meds:.Place/Maintain arterial line **AND** sodium chloride, acetaminophen **OR** acetaminophen, fentaNYL (SUBLIMAZE) injection, fentaNYL (SUBLIMAZE) injection, HYDROcodone-acetaminophen, lidocaine (PF), lidocaine-prilocaine, ondansetron **OR** ondansetron (ZOFRAN) IV, mouth rinse, pentafluoroprop-tetrafluoroeth, sodium chloride flush, sodium chloride flush  Current Labs: reviewed    Physical Exam:  Blood pressure (!) 107/26, pulse 74, temperature (!) 97.5 F (36.4 C), temperature source Axillary,  resp. rate 18, height _0  (1.778 m), weight 112.7 kg, SpO2 97 %. 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 Morphine should not be used.  Baclofen should be avoided Avoid oral sodium phosphate and magnesium citrate based laxatives / bowel preps    Pearson Grippe MD 06/16/2022, 9:12 AM  Recent Labs  Lab 06/13/22 0451 06/14/22 0313 06/15/22 0356 06/16/22 0355  NA 138 134* 136 133*  K 3.6 3.8 4.4 4.7  CL 96* 93* 94* 94*  CO2 _1 GLUCOSE 164* 289* 196* 275*  BUN 29* 42* 55* 43*  CREATININE 6.37* 8.39* 10.39* 7.56*  CALCIUM 8.3* 7.9* 8.3* 8.8*  PHOS 4.6 3.8 5.1*  --    Recent Labs  Lab 06/12/22 1832 06/13/22 0451 06/14/22 0313 06/15/22 0356 06/16/22 0355  WBC 17.2*   < > 24.3* 25.3* 22.1*  NEUTROABS 14.7*  --   --   --   --   HGB 10.8*   < > 9.8* 9.5* 9.5*  HCT 34.4*   < > 31.1* 30.2* 29.4*  MCV 100.6*   < > 98.4 98.7 97.0  PLT 209   < > 231  237 284   < > = values in this interval not displayed.

## 2022-06-17 DIAGNOSIS — R000 Tachycardia, unspecified: Secondary | ICD-10-CM

## 2022-06-17 DIAGNOSIS — K610 Anal abscess: Secondary | ICD-10-CM | POA: Diagnosis not present

## 2022-06-17 DIAGNOSIS — I959 Hypotension, unspecified: Secondary | ICD-10-CM

## 2022-06-17 DIAGNOSIS — A419 Sepsis, unspecified organism: Secondary | ICD-10-CM | POA: Diagnosis not present

## 2022-06-17 DIAGNOSIS — J9601 Acute respiratory failure with hypoxia: Secondary | ICD-10-CM | POA: Diagnosis not present

## 2022-06-17 LAB — RENAL FUNCTION PANEL
Albumin: 2.2 g/dL — ABNORMAL LOW (ref 3.5–5.0)
Anion gap: 17 — ABNORMAL HIGH (ref 5–15)
BUN: 68 mg/dL — ABNORMAL HIGH (ref 8–23)
CO2: 25 mmol/L (ref 22–32)
Calcium: 8.3 mg/dL — ABNORMAL LOW (ref 8.9–10.3)
Chloride: 93 mmol/L — ABNORMAL LOW (ref 98–111)
Creatinine, Ser: 9.37 mg/dL — ABNORMAL HIGH (ref 0.61–1.24)
GFR, Estimated: 5 mL/min — ABNORMAL LOW (ref >60)
Glucose, Bld: 306 mg/dL — ABNORMAL HIGH (ref 70–99)
Phosphorus: 6.4 mg/dL — ABNORMAL HIGH (ref 2.5–4.6)
Potassium: 4.7 mmol/L (ref 3.5–5.1)
Sodium: 135 mmol/L (ref 135–145)

## 2022-06-17 LAB — CBC
HCT: 29.5 % — ABNORMAL LOW (ref 39.0–52.0)
Hemoglobin: 9.5 g/dL — ABNORMAL LOW (ref 13.0–17.0)
MCH: 31 pg (ref 26.0–34.0)
MCHC: 32.2 g/dL (ref 30.0–36.0)
MCV: 96.4 fL (ref 80.0–100.0)
Platelets: 290 10*3/uL (ref 150–400)
RBC: 3.06 MIL/uL — ABNORMAL LOW (ref 4.22–5.81)
RDW: 14.5 % (ref 11.5–15.5)
WBC: 21.8 10*3/uL — ABNORMAL HIGH (ref 4.0–10.5)
nRBC: 0 % (ref 0.0–0.2)

## 2022-06-17 LAB — GLUCOSE, CAPILLARY
Glucose-Capillary: 302 mg/dL — ABNORMAL HIGH (ref 70–99)
Glucose-Capillary: 295 mg/dL — ABNORMAL HIGH (ref 70–99)
Glucose-Capillary: 306 mg/dL — ABNORMAL HIGH (ref 70–99)
Glucose-Capillary: 292 mg/dL — ABNORMAL HIGH (ref 70–99)
Glucose-Capillary: 280 mg/dL — ABNORMAL HIGH (ref 70–99)
Glucose-Capillary: 325 mg/dL — ABNORMAL HIGH (ref 70–99)

## 2022-06-17 LAB — POCT I-STAT, CHEM 8
BUN: 26 mg/dL — ABNORMAL HIGH (ref 8–23)
Calcium, Ion: 1.05 mmol/L — ABNORMAL LOW (ref 1.15–1.40)
Chloride: 93 mmol/L — ABNORMAL LOW (ref 98–111)
Creatinine, Ser: 6.4 mg/dL — ABNORMAL HIGH (ref 0.61–1.24)
Glucose, Bld: 177 mg/dL — ABNORMAL HIGH (ref 70–99)
HCT: 33 % — ABNORMAL LOW (ref 39.0–52.0)
Hemoglobin: 11.2 g/dL — ABNORMAL LOW (ref 13.0–17.0)
Potassium: 3.7 mmol/L (ref 3.5–5.1)
Sodium: 134 mmol/L — ABNORMAL LOW (ref 135–145)
TCO2: 28 mmol/L (ref 22–32)

## 2022-06-17 LAB — HEMOGLOBIN A1C
Hgb A1c MFr Bld: 6.5 % — ABNORMAL HIGH (ref 4.8–5.6)
Mean Plasma Glucose: 140 mg/dL

## 2022-06-17 LAB — PROCALCITONIN: Procalcitonin: 1.71 ng/mL

## 2022-06-17 MED ORDER — INSULIN ASPART 100 UNIT/ML IJ SOLN
5.0000 [IU] | Freq: Three times a day (TID) | INTRAMUSCULAR | Status: DC
  Administered 2022-06-17 (×2): 5 [IU] via SUBCUTANEOUS

## 2022-06-17 MED ORDER — SODIUM CHLORIDE 0.9 % IR SOLN
Freq: Every day | Status: AC
  Filled 2022-06-17 (×4): qty 500

## 2022-06-17 MED ORDER — INSULIN ASPART 100 UNIT/ML IJ SOLN
0.0000 [IU] | INTRAMUSCULAR | Status: DC
  Administered 2022-06-17: 4 [IU] via SUBCUTANEOUS
  Administered 2022-06-17 (×2): 3 [IU] via SUBCUTANEOUS
  Administered 2022-06-17: 5 [IU] via SUBCUTANEOUS
  Administered 2022-06-17 (×2): 4 [IU] via SUBCUTANEOUS
  Administered 2022-06-18 (×3): 2 [IU] via SUBCUTANEOUS
  Administered 2022-06-18: 3 [IU] via SUBCUTANEOUS
  Administered 2022-06-18 (×2): 2 [IU] via SUBCUTANEOUS

## 2022-06-17 MED ORDER — POLYETHYLENE GLYCOL 3350 17 G PO PACK
17.0000 g | PACK | Freq: Every day | ORAL | Status: DC
  Administered 2022-06-17 – 2022-06-23 (×6): 17 g via ORAL
  Filled 2022-06-17 (×9): qty 1

## 2022-06-17 MED ORDER — ORAL CARE MOUTH RINSE: 15.0000 mL | OROMUCOSAL | Status: DC | PRN

## 2022-06-17 MED ORDER — SODIUM CHLORIDE 0.9 % IR SOLN
Freq: Every day | Status: DC
  Filled 2022-06-17: qty 500

## 2022-06-17 MED ORDER — PANTOPRAZOLE SODIUM 40 MG PO TBEC
40.0000 mg | DELAYED_RELEASE_TABLET | Freq: Every day | ORAL | Status: DC
  Administered 2022-06-18 – 2022-06-26 (×9): 40 mg via ORAL
  Filled 2022-06-17 (×9): qty 1

## 2022-06-17 MED ORDER — INSULIN GLARGINE-YFGN 100 UNIT/ML ~~LOC~~ SOLN
20.0000 [IU] | Freq: Every day | SUBCUTANEOUS | Status: DC
  Administered 2022-06-17: 20 [IU] via SUBCUTANEOUS
  Filled 2022-06-17 (×2): qty 0.2

## 2022-06-17 NOTE — Progress Notes (Addendum)
Writer spoke with Dr Wynetta Emery and Dr Arnoldo Morale about femoral line. Per Dr Arnoldo Morale, he wants to keep the femoral line and avoid sticking patient again for a central line. Writer had spoken with [facility] with IV team who checked with Nephro MD (Dr Joelyn Oms) and determined that NO PICC line to be placed per Nephrology. Will continue to monitor Femoral line and continue femoral line for now due to patient is still on IV Vasopressor.

## 2022-06-17 NOTE — Progress Notes (Signed)
Progress Note  Patient Name: Justin [name] Date of Encounter: 06/17/2022  Primary Cardiologist: None  Subjective   Patient extubated and currently awake alert and oriented x 3.  No symptoms.  Inpatient Medications    Scheduled Meds:  Chlorhexidine Gluconate Cloth  6 each Topical Daily   hydrogen peroxide 3 % 10 Application in sodium chloride irrigation 0.9 % 500 mL irrigation   Irrigation Daily   insulin aspart  0-6 Units Subcutaneous Q4H   insulin aspart  5 Units Subcutaneous TID WC   insulin glargine-yfgn  20 Units Subcutaneous Daily   [START ON 06/18/2022] pantoprazole  40 mg Oral QAC breakfast   polyethylene glycol  17 g Oral Daily   sodium chloride flush  10-40 mL Intracatheter Q12H   sucroferric oxyhydroxide  500 mg Oral BID WC   Continuous Infusions:  sodium chloride     amiodarone 30 mg/hr (06/17/22 0323)   norepinephrine (LEVOPHED) Adult infusion 16 mcg/min (06/17/22 1029)   piperacillin-tazobactam (ZOSYN) 2.25 g in sodium chloride 0.9 % 50 mL IVPB 2.25 g (06/17/22 0534)   vancomycin 200 mL/hr at 06/15/22 1139   PRN Meds: acetaminophen **OR** acetaminophen, HYDROcodone-acetaminophen, lidocaine (PF), lidocaine-prilocaine, menthol-cetylpyridinium, ondansetron **OR** ondansetron (ZOFRAN) IV, mouth rinse, pentafluoroprop-tetrafluoroeth   Vital Signs    Vitals:   06/17/22 1245 06/17/22 1315 06/17/22 1354 06/17/22 1400  BP: (!) 130/37 (!) 112/45 126/69 130/73  Pulse: 98 96 99 95  Resp: 16 (!) 22 (!) 21 (!) 23  Temp:      TempSrc:      SpO2: 94% 98% 96% 95%  Weight:      Height:        Intake/Output Summary (Last 24 hours) at 06/17/2022 1416 Last data filed at 06/17/2022 1034 Gross per 24 hour  Intake 715.11 ml  Output --  Net 715.11 ml   Filed Weights   06/15/22 1210 06/16/22 0427 06/17/22 0500  Weight: 106.1 kg 112.7 kg 110.3 kg    Telemetry    Personally reviewed, HR controlled 80 to 90s and appears to be in atrial flutter.  3 beat nonsustained V.  tach was noted.  There is also an artifact with flutter waves.  ECG   Rate controlled atrial flutter on 06/15/2022  Physical Exam   GEN: No acute distress.   Neck: No JVD. Cardiac: RRR, no murmur, rub, or gallop.  Respiratory: Nonlabored. Clear to auscultation bilaterally. GI: Soft, nontender, bowel sounds present. MS: No edema; No deformity. Neuro:  Nonfocal. Psych: Alert and oriented x 3. Normal affect.  Labs    Chemistry Recent Labs  Lab 06/13/22 0451 06/14/22 0313 06/15/22 0356 06/15/22 1243 06/16/22 0355 06/17/22 0401  NA 138 134* 136 134* 133* 135  K 3.6 3.8 4.4 3.7 4.7 4.7  CL 96* 93* 94* 93* 94* 93*  CO2 '30 27 26  '$ --  24 25  GLUCOSE 164* 289* 196* 177* 275* 306*  BUN 29* 42* 55* 26* 43* 68*  CREATININE 6.37* 8.39* 10.39* 6.40* 7.56* 9.37*  CALCIUM 8.3* 7.9* 8.3*  --  8.8* 8.3*  PROT 7.0  --   --   --   --   --   ALBUMIN 2.6* 2.5* 2.6*  --   --  2.2*  AST 20  --   --   --   --   --   ALT 14  --   --   --   --   --   ALKPHOS 65  --   --   --   --   --  BILITOT 0.8  --   --   --   --   --   GFRNONAA 8* 6* 5*  --  7* 5*  ANIONGAP 12 14 16*  --  15 17*     Hematology Recent Labs  Lab 06/15/22 0356 06/15/22 1243 06/16/22 0355 06/17/22 0401  WBC 25.3*  --  22.1* 21.8*  RBC 3.06*  --  3.03* 3.06*  HGB 9.5* 11.2* 9.5* 9.5*  HCT 30.2* 33.0* 29.4* 29.5*  MCV 98.7  --  97.0 96.4  MCH 31.0  --  31.4 31.0  MCHC 31.5  --  32.3 32.2  RDW 14.2  --  14.4 14.5  PLT 237  --  284 290    Cardiac EnzymesNo results for input(s): "TROPONINIHS" in the last 720 hours.  BNPNo results for input(s): "BNP", "PROBNP" in the last 168 hours.   DDimerNo results for input(s): "DDIMER" in the last 168 hours.   Radiology    DG Chest Port 1 View  Result Date: 06/16/2022 CLINICAL DATA:  Acute respiratory failure with hypoxia and hypercapnia. Patient on vent. EXAM: PORTABLE CHEST 1 VIEW COMPARISON:  AP chest 06/15/2022 (multiple studies), chest two views 01/28/2020 FINDINGS:  Endotracheal tube tip terminates approximately 6.3 cm above the carina, at the inferior aspect of the clavicular heads and similar to prior. Enteric tube descends below the diaphragm with the tip and side port overlying the left upper abdominal quadrant. Moderately decreased lung volumes. Right mid and lower lung horizontal linear densities, likely subsegmental atelectasis. No focal left lung airspace opacity. No definite pleural effusion. No pneumothorax. No acute skeletal abnormality. IMPRESSION: 1. Endotracheal tube tip terminates approximately 6.3 cm above the carina and similar to prior. 2. Enteric tube descends below the diaphragm with the tip and side port overlying the left upper abdominal quadrant. 3. Right mid and lower lung linear opacities are similar to prior, probable atelectasis. It is difficult to exclude mild pneumonia. Electronically Signed   By: Yvonne Kendall M.D.   On: 06/16/2022 09:02   DG CHEST PORT 1 VIEW  Result Date: 06/15/2022 CLINICAL DATA:  OG tube placement EXAM: PORTABLE CHEST 1 VIEW COMPARISON:  06/15/2022, 2:47 p.m. FINDINGS: Interval placement of esophagogastric tube, tip and side port below the diaphragm. Otherwise unchanged rotated AP portable examination with endotracheal tube over the midtrachea, as well as bilateral perihilar heterogeneous and consolidative airspace opacities. No new airspace opacity. IMPRESSION: 1. Interval placement of esophagogastric tube, tip and side port below the diaphragm. 2. Otherwise unchanged rotated AP portable examination with endotracheal tube over the midtrachea, as well as bilateral perihilar heterogeneous and consolidative airspace opacities. Electronically Signed   By: Delanna Ahmadi M.D.   On: 06/15/2022 18:15   DG Chest Port 1 View  Result Date: 06/15/2022 CLINICAL DATA:  ETT placement EXAM: PORTABLE CHEST 1 VIEW COMPARISON:  Chest x-ray dated July 18th 2021 FINDINGS: ETT tip is proximally 4.5 cm from the carina. Cardiac and  mediastinal contours within normal limits. Left perihilar opacities and right mid lung linear opacities, likely due to atelectasis. No pleural effusion or pneumothorax. IMPRESSION: Left perihilar opacities and right mid lung linear opacities, likely due to atelectasis. Electronically Signed   By: Yetta Glassman M.D.   On: 06/15/2022 15:01    Cardiac Studies  Echo on 06/13/2022 LVEF 60 to 82% LV diastolic parameters indeterminate RV systolic function is low normal Moderate TR Moderate AS   Assessment & Plan    Patient is a 75 year old M known to have  ESRD, HTN, HLD, HFpEF presented to the hospital for perirectal abscess management.  Hospital course was complicated by wide-complex tachycardia for which cardiology was consulted.  No strips to review. EKG and telemetry showed atrial flutter, rates controlled with amiodarone drip.   # Wide-complex tachycardia, resolved # Atrial flutter, rate controlled -Currently on amiodarone drip.  Switch to p.o. amiodarone 200 mg twice daily for 3 weeks followed by 200 mg once daily thereafter. Low blood pressures on AV nodal agents per [facility] EP's note. Obtain EKG. -Eliquis on hold for surgery. Can resume when stable from bleeding standpoint. -Follow-up with [facility] EP as outpatient # Valvular heart disease (moderate aortic valve stenosis and moderate TR) -Outpatient surveillance with echo every 1 year  I have spent a total of 33 minutes with patient reviewing chart , telemetry, EKGs, labs and examining patient as well as establishing an assessment and plan that was discussed with the patient.  > 50% of time was spent in direct patient care.     Signed, Chalmers Guest, MD  06/17/2022, 2:16 PM

## 2022-06-17 NOTE — Inpatient Diabetes Management (Signed)
Inpatient Diabetes Program Recommendations  AACE/ADA: New Consensus Statement on Inpatient Glycemic Control (2015)  Target Ranges:  Prepandial:   less than 140 mg/dL      Peak postprandial:   less than 180 mg/dL (1-2 hours)      Critically ill patients:  140 - 180 mg/dL   Lab Results  Component Value Date   GLUCAP 295 (H) 06/17/2022   HGBA1C 6.1 (H) 01/28/2020    Review of Glycemic Control  Diabetes history: DM 2 Outpatient Diabetes medications: diet controlled for 4 years Current orders for Inpatient glycemic control:  Semglee 20 units Daily Novolog 0-6 units Q4 hours Novolog 5 units tid meal coverage  A1c pending Pt reports last month his A1c was 5.7%  Spoke with pt over the phone regarding glucose levels and his Diabetes at home. Pt reports not needing medications for his diabetes in years. Pt reports he has been on insulin in the past with a vial and syringe. I discussed the possible need for insulin outpatient when discharged for glucose control to promote wound healing. Pt reports checking glucose levels and other labs with Dialysis at least once a month. Every time trends and levels have been at goal. PT understands possible need for insulin at discharge. Pt understands importance of glucose control. Will monitor glucose trends while here. Pt reports having a glucose meter at home. Discussed with pt to start checking glucose at least twice a day to make sure trends stay within 150 average all the time.   Nursing staff will need to refresh pt on insulin use at discharge if that is in the plan of care.  Thanks,  Tama Headings RN, MSN, BC-ADM Inpatient Diabetes Coordinator Team Pager [phone] (8a-5p)

## 2022-06-17 NOTE — Progress Notes (Signed)
 [facility] Progress Note   Assessment/ Plan:   1. Severe sepsis/ perirectal abscess: s/p I and D 12/4 with gen surg.  Off pressor this AM 2. ESRD:  MWF, using AVF.  HD today on schedule, 1L UF goal 3. 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 Nutrition: renal diet with fluid restriction 6. S/p cardiac arrest- brief, in OR, ROSC achieved, Vtach 7.  Afib: on amio and OP AC    Subjective:    Seen in room.  Feels sore at operative site but otherwise is awake, alert.  For HD today- goal 1L.  Extubated yesterday and is doing well.     Objective:   BP (!) 103/39   Pulse 96   Temp 97.7 F (36.5 C) (Oral)   Resp (!) 21   Ht '5\' 10"'$  (1.778 m)   Wt 110.3 kg   SpO2 94%   BMI 34.89 kg/m   Physical Exam: Gen:NAD, lying in bed CVS: irregular, soft systolic murmur Resp: clear anteriorly Abd: soft, nontender, NABS Ext: 1+ LE edema ACCESS: L AVF + T/B  Labs: BMET Recent Labs  Lab 06/12/22 1832 06/13/22 0451 06/14/22 0313 06/15/22 0356 06/15/22 1243 06/16/22 0355 06/17/22 0401  NA 140 138 134* 136 134* 133* 135  K 3.5 3.6 3.8 4.4 3.7 4.7 4.7  CL 95* 96* 93* 94* 93* 94* 93*  CO2 32 '30 27 26  '$ --  24 25  GLUCOSE 154* 164* 289* 196* 177* 275* 306*  BUN 21 29* 42* 55* 26* 43* 68*  CREATININE 5.32* 6.37* 8.39* 10.39* 6.40* 7.56* 9.37*  CALCIUM 8.6* 8.3* 7.9* 8.3*  --  8.8* 8.3*  PHOS  --  4.6 3.8 5.1*  --   --  6.4*   CBC Recent Labs  Lab 06/12/22 1832 06/13/22 0451 06/14/22 0313 06/15/22 0356 06/15/22 1243 06/16/22 0355 06/17/22 0401  WBC 17.2*   < > 24.3* 25.3*  --  22.1* 21.8*  NEUTROABS 14.7*  --   --   --   --   --   --   HGB 10.8*   < > 9.8* 9.5* 11.2* 9.5* 9.5*  HCT 34.4*   < > 31.1* 30.2* 33.0* 29.4* 29.5*  MCV 100.6*   < > 98.4 98.7  --  97.0 96.4  PLT 209   < > 231 237  --  284 290   < > = values in this interval not displayed.      Medications:     Chlorhexidine Gluconate Cloth  6 each Topical Daily   insulin aspart  0-6 Units  Subcutaneous Q4H   insulin aspart  5 Units Subcutaneous TID WC   insulin glargine-yfgn  20 Units Subcutaneous Daily   pantoprazole (PROTONIX) IV  40 mg Intravenous Q24H   polyethylene glycol  17 g Oral Daily   sodium chloride flush  10-40 mL Intracatheter Q12H   sucroferric oxyhydroxide  500 mg Oral BID WC     Luan Moore, MD 06/17/2022, 9:49 AM

## 2022-06-17 NOTE — Progress Notes (Signed)
Received patient in bed to unit.  Alert and oriented.  Informed consent signed and in chart.   Treatment initiated: Volo Treatment completed: 1750  Patient tolerated well.  without acute distress.  Hand-off given to patient's nurse. King. Anderson Malta RN  Access used: AVF Access issues: BFR 300  Total UF removed: 1 L Medication(s) given: Vancocin 1 g IV. Zosyn 2.25 g IV. Post HD VS: 120/70 P 100 R 20 Post HD weight: 109 kg   [facility]

## 2022-06-17 NOTE — TOC Initial Note (Signed)
Transition of Care [facility]) - Initial/Assessment Note    Patient Details  Name: Justin [name] MRN: [id number] Date of Birth: March 08, 1946  [facility] CM/SW Contact:    Salome Arnt, [place] Phone Number: 06/17/2022, 2:51 PM  Clinical Narrative: Pt admitted with  sepsis secondary to perirectal abscess. Assessment completed with pt due to high risk readmission score. Pt reports he lives alone and is independent with ADLs at baseline. He ambulates with a walker. Pt has transportation to appointments. Per chart, his dialysis schedule is MWF at [facility] in [place]. Discussed d/c plan and pt indicates he may need SNF. Will request order for PT evaluation. If pt returns home, he requests 3N1. TOC will follow up after PT evaluation.                      Barriers to Discharge: Continued Medical Work up   Patient Goals and CMS Choice Patient states their goals for this hospitalization and ongoing recovery are:: unsure   Choice offered to / list presented to : Patient  Expected Discharge Plan and Services   In-house Referral: Clinical Social Work     Living arrangements for the past 2 months: Single Family Home                                      Prior Living Arrangements/Services Living arrangements for the past 2 months: Single Family Home Lives with:: Self Patient language and need for interpreter reviewed:: Yes Do you feel safe going back to the place where you live?: Yes      Need for Family Participation in Patient Care: No (Comment)   Current home services: DME (walker) Criminal Activity/Legal Involvement Pertinent to Current Situation/Hospitalization: No - Comment as needed  Activities of Daily Living Home Assistive Devices/Equipment: Cane (specify quad or straight) ADL Screening (condition at time of admission) Patient's cognitive ability adequate to safely complete daily activities?: Yes Is the patient deaf or have difficulty hearing?:  No Does the patient have difficulty seeing, even when wearing glasses/contacts?: No Does the patient have difficulty concentrating, remembering, or making decisions?: No Patient able to express need for assistance with ADLs?: Yes Does the patient have difficulty dressing or bathing?: No Independently performs ADLs?: No Communication: Independent Dressing (OT): Independent Grooming: Independent Feeding: Independent Bathing: Independent Toileting: Independent In/Out Bed: Independent Walks in Home: Independent with device (comment) Does the patient have difficulty walking or climbing stairs?: No Weakness of Legs: Both Weakness of Arms/Hands: None  Permission Sought/Granted                  Emotional Assessment     Affect (typically observed): Appropriate Orientation: : Oriented to Self, Oriented to Place, Oriented to  Time, Oriented to Situation Alcohol / Substance Use: Not Applicable Psych Involvement: No (comment)  Admission diagnosis:  Perianal abscess [K61.0] Perirectal abscess [K61.1] Patient Active Problem List   Diagnosis Date Noted   Wide-complex tachycardia 06/17/2022   Acute respiratory failure with hypoxia and hypercapnia ([place]) 06/15/2022   Hypotension 06/13/2022   Obesity (BMI 30-39.9) 06/13/2022   Perirectal abscess 06/13/2022   Perianal abscess 06/12/2022   Acute cough 05/12/2022   Acute URI 05/12/2022   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   Murmur, cardiac 01/27/2022   Chronic back pain 01/27/2022   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[place]) 01/27/2022   Hypercalcemia 11/14/2021  Bilateral lower extremity edema 11/11/2021   GERD (gastroesophageal reflux disease) 11/11/2021   High risk medication use 11/11/2021   History of cardioversion 11/11/2021   Paroxysmal atrial fibrillation with RVR ([place]) 11/11/2021   PVC (premature ventricular contraction) 11/11/2021   Nonrheumatic aortic valve stenosis 11/06/2021   Mixed hyperlipidemia  11/06/2021   Abdominal mass 05/05/2021   First degree AV block 07/13/2020   Septic shock ([place])    Atrial flutter ([place]) 01/29/2020   Abscess    Wound infection 01/28/2020   H/O vitrectomy 12/07/2019   Macular pucker, 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   Follow-up examination after eye surgery 10/24/2019   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 determined by examination associated with type 2 diabetes mellitus ([place]) 10/24/2019   Stable treated proliferative diabetic retinopathy of left eye determined by examination associated with type 2 diabetes mellitus ([place]) 10/24/2019   Allergy, unspecified, initial encounter 10/10/2019   Abnormality of albumin 05/31/2019   Hyperkalemia 10/19/2018   Encounter for immunization 07/30/2017   ESRD on dialysis ([place]) 07/29/2017   Anticoagulated by anticoagulation treatment 07/13/2017   Malignant neoplasm of prostate ([place]) 07/12/2017   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   Dependence on renal dialysis ([place]) 01/11/2017   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Phone: [phone]: [phone]    Social Determinants of Health (SDOH) Interventions    Readmission Risk Interventions    06/17/2022    2:47 PM  Readmission Risk Prevention Plan  Transportation Screening Complete  HRI or HBakerComplete  Social  Work Consult for RCourtlandPlanning/Counseling Complete  Palliative Care Screening Not Applicable  Medication Review (Press photographer Complete

## 2022-06-17 NOTE — Progress Notes (Signed)
2 Days Post-Op  Subjective: Patient has minimal perirectal pain.  Objective: Vital signs in last 24 hours: Temp:  [97.6 F (36.4 C)-97.8 F (36.6 C)] 97.6 F (36.4 C) (12/06 1146) Pulse Rate:  [34-98] 96 (12/06 1315) Resp:  [10-26] 22 (12/06 1315) BP: (84-139)/(11-89) 112/45 (12/06 1315) SpO2:  [90 %-100 %] 98 % (12/06 1315) Weight:  [110.3 kg] 110.3 kg (12/06 0500) Last BM Date : 06/13/22  Intake/Output from previous day: 12/05 0701 - 12/06 0700 In: 1139.1 [I.V.:947.3; IV Piggyback:191.8] Out: -  Intake/Output this shift: Total I/O In: 10 [I.V.:10] Out: -   General appearance: alert, cooperative, and no distress Perineal wounds healing well by secondary intention.  Minimal drainage noted.  Both wounds irrigated with 20 cc of peroxide.  Lab Results:  Recent Labs    06/16/22 0355 06/17/22 0401  WBC 22.1* 21.8*  HGB 9.5* 9.5*  HCT 29.4* 29.5*  PLT 284 290   BMET Recent Labs    06/16/22 0355 06/17/22 0401  NA 133* 135  K 4.7 4.7  CL 94* 93*  CO2 24 25  GLUCOSE 275* 306*  BUN 43* 68*  CREATININE 7.56* 9.37*  CALCIUM 8.8* 8.3*   PT/INR No results for input(s): "LABPROT", "INR" in the last 72 hours.  Studies/Results: DG Chest Port 1 View  Result Date: 06/16/2022 CLINICAL DATA:  Acute respiratory failure with hypoxia and hypercapnia. Patient on vent. EXAM: PORTABLE CHEST 1 VIEW COMPARISON:  AP chest 06/15/2022 (multiple studies), chest two views 01/28/2020 FINDINGS: Endotracheal tube tip terminates approximately 6.3 cm above the carina, at the inferior aspect of the clavicular heads and similar to prior. Enteric tube descends below the diaphragm with the tip and side port overlying the left upper abdominal quadrant. Moderately decreased lung volumes. Right mid and lower lung horizontal linear densities, likely subsegmental atelectasis. No focal left lung airspace opacity. No definite pleural effusion. No pneumothorax. No acute skeletal abnormality. IMPRESSION: 1.  Endotracheal tube tip terminates approximately 6.3 cm above the carina and similar to prior. 2. Enteric tube descends below the diaphragm with the tip and side port overlying the left upper abdominal quadrant. 3. Right mid and lower lung linear opacities are similar to prior, probable atelectasis. It is difficult to exclude mild pneumonia. Electronically Signed   By: Yvonne Kendall M.D.   On: 06/16/2022 09:02   DG CHEST PORT 1 VIEW  Result Date: 06/15/2022 CLINICAL DATA:  OG tube placement EXAM: PORTABLE CHEST 1 VIEW COMPARISON:  06/15/2022, 2:47 p.m. FINDINGS: Interval placement of esophagogastric tube, tip and side port below the diaphragm. Otherwise unchanged rotated AP portable examination with endotracheal tube over the midtrachea, as well as bilateral perihilar heterogeneous and consolidative airspace opacities. No new airspace opacity. IMPRESSION: 1. Interval placement of esophagogastric tube, tip and side port below the diaphragm. 2. Otherwise unchanged rotated AP portable examination with endotracheal tube over the midtrachea, as well as bilateral perihilar heterogeneous and consolidative airspace opacities. Electronically Signed   By: Delanna Ahmadi M.D.   On: 06/15/2022 18:15   DG Chest Port 1 View  Result Date: 06/15/2022 CLINICAL DATA:  ETT placement EXAM: PORTABLE CHEST 1 VIEW COMPARISON:  Chest x-ray dated July 18th 2021 FINDINGS: ETT tip is proximally 4.5 cm from the carina. Cardiac and mediastinal contours within normal limits. Left perihilar opacities and right mid lung linear opacities, likely due to atelectasis. No pleural effusion or pneumothorax. IMPRESSION: Left perihilar opacities and right mid lung linear opacities, likely due to atelectasis. Electronically Signed   By:  Yetta Glassman M.D.   On: 06/15/2022 15:01    Anti-infectives: Anti-infectives (From admission, onward)    Start     Dose/Rate Route Frequency Ordered Stop   06/15/22 1200  vancomycin (VANCOCIN) IVPB 1000 mg/200  mL premix        1,000 mg 200 mL/hr over 60 Minutes Intravenous Every M-W-F (Hemodialysis) 06/15/22 1048     06/13/22 1400  piperacillin-tazobactam (ZOSYN) 2.25 g in sodium chloride 0.9 % 50 mL IVPB        2.25 g 100 mL/hr over 30 Minutes Intravenous Every 8 hours 06/13/22 1038     06/13/22 0600  piperacillin-tazobactam (ZOSYN) IVPB 2.25 g  Status:  Discontinued        2.25 g 100 mL/hr over 30 Minutes Intravenous Every 8 hours 06/12/22 2038 06/13/22 1037   06/12/22 2045  vancomycin (VANCOREADY) IVPB 1500 mg/300 mL        1,500 mg 150 mL/hr over 120 Minutes Intravenous  Once 06/12/22 2038 06/13/22 0043   06/12/22 2045  piperacillin-tazobactam (ZOSYN) IVPB 3.375 g        3.375 g 100 mL/hr over 30 Minutes Intravenous  Once 06/12/22 2038 06/12/22 2154       Assessment/Plan: s/p Procedure(s): IRRIGATION AND DEBRIDEMENT PERIRECTAL ABSCESS Impression: Status post incision and drainage of perirectal abscess.  Final ID of cultures still pending. Continue wound care as ordered.  LOS: 5 days    Aviva Signs 06/17/2022

## 2022-06-17 NOTE — Progress Notes (Signed)
PROGRESS NOTE  Justin [name] DGL:[id number] DOB: 1946-05-27 DOA: 06/12/2022 PCP: Michell Heinrich, DO   Brief History:  76 year old male with a history of ESRD (MWF) atrial fibrillation/atrial flutter, hypertension, hyperlipidemia, prediabetes, tobacco abuse in remission presenting with 1 month history of buttock pain, right greater than left.  Patient stated that it started as a boil that subsequently burst.  After testing, he began noticing worsening pain in his perianal region with sitting.  It has progressively gotten worse.  He denies any fevers, chills, chest pain, shortness of breath, coughing, hemoptysis, nausea, vomiting, direct abdominal pain.  He has not been on any recent antibiotics.  Because of his worsening pain, the patient presented for further evaluation and treatment.Marland Kitchen He last had dialysis on 06/12/2022.  In the ED, the patient had a low-grade temperature 100.2 F.  He was initially hypotensive with a blood pressure of 86/43.  WBC 17.2, hemoglobin 10.8, platelets 199,000.  Sodium 140, potassium 3.5, bicarbonate 32, serum creatinine 5.32.  CT of the abdomen and pelvis showed a multiloculated/fluid collection and gas collection in the perianal region from the perineum to the inferior buttocks bilaterally.  The patient was started on vancomycin and Zosyn.  General surgery was consulted to assist with management.  The patient's apixaban was held in preparation for surgery.   Assessment/Plan: Severe sepsis -Secondary to perirectal abscess -Presented with tachycardia, leukocytosis, and lactate 2.7 -06/14/22 arterial line inserted -06/14/22  CVL inserted by Dr. Arnoldo Morale -levophed 8-9 mcg/kg/min>>2 mcg/kg -Continue vancomycin and Zosyn - he remains on IV pressor support, wean as able   Perirectal abscess -General surgery consulted -restart apixaban when ok with surgery -Echocardiogram EF 60-65%, low normal RVF, mod TR -I&D on 06/15/2022>>GNR and GPC clusters   Wide Complex  tachycardia -?Vtach -Wide complex tachcyardia with thready pulse, received 15 seconds of CPR. Another short 5 second episode while in OR -appreciate cardiology--discussed with Dr. Harl Bowie -on amio drip per cardiology   Acute respiratory Failure with hypoxia -left intubated post-op -personally reviewed CXR 12/4--no edema or infiltrates -Dr. Melvyn Novas consulted -06/16/22--extubated -now on 3L [place] with 100% sat   ESRD -He dialyzes on Monday Wednesday Friday -Last dialysis 06/12/2022 -Nephrology consulted for next dialysis -Continue Velphoro and Sensipar -HD on 12/4 prior to surgery -next HD 12/6   Atrial flutter -Holding apixaban in preparation for surgery -Continue amiodarone gtt   Mixed hyperlipidemia -Continue statin   Essential hypertension -Holding metoprolol secondary to hypotension   Obesity -BMI 33.58 -lifestyle modification      Family Communication: no family at bedside 12/6, discussed plan of care with patient who verbalized understanding   Consultants:  renal, general surgery, cardiology, PCCM   Code Status:  FULL    DVT Prophylaxis:  apixaban on hold     Procedures: As Listed in Progress Note Above   Antibiotics: Vanc 12/1>> Zosyn 12/1>>  Subjective: Awake, alert, oriented, NAD, no specific complaints.   Objective: Vitals:   06/17/22 0900 06/17/22 0915 06/17/22 1015 06/17/22 1146  BP: (!) 106/21 (!) 103/39 (!) 122/33   Pulse:    78  Resp: (!) 21 (!) 21 (!) 25 19  Temp:    97.6 F (36.4 C)  TempSrc:    Oral  SpO2:    95%  Weight:      Height:        Intake/Output Summary (Last 24 hours) at 06/17/2022 1314 Last data filed at 06/17/2022 1034 Gross per 24 hour  Intake 715.[place]  ml  Output --  Net 715.11 ml   Weight change: 4.158 kg Exam:  General:  Pt is awake, alert, follows commands appropriately, not in acute distress HEENT: No icterus, No thrush, No neck mass, [place]/AT Cardiovascular: normal S1/S2, no rubs, no gallops Respiratory: bibasilar  crackles. No wheeze Abdomen: Soft/+BS, non tender, non distended, no guarding Extremities: No edema, No lymphangitis, No petechiae, No rashes, no synovitis  Data Reviewed: I have personally reviewed following labs and imaging studies Basic Metabolic Panel: Recent Labs  Lab 06/13/22 0451 06/14/22 0313 06/15/22 0356 06/15/22 1243 06/16/22 0355 06/17/22 0401  NA 138 134* 136 134* 133* 135  K 3.6 3.8 4.4 3.7 4.7 4.7  CL 96* 93* 94* 93* 94* 93*  CO2 '30 27 26  '$ --  24 25  GLUCOSE 164* 289* 196* 177* 275* 306*  BUN 29* 42* 55* 26* 43* 68*  CREATININE 6.37* 8.39* 10.39* 6.40* 7.56* 9.37*  CALCIUM 8.3* 7.9* 8.3*  --  8.8* 8.3*  MG 2.0  --   --   --  1.9  --   PHOS 4.6 3.8 5.1*  --   --  6.4*   Liver Function Tests: Recent Labs  Lab 06/13/22 0451 06/14/22 0313 06/15/22 0356 06/17/22 0401  AST 20  --   --   --   ALT 14  --   --   --   ALKPHOS 65  --   --   --   BILITOT 0.8  --   --   --   PROT 7.0  --   --   --   ALBUMIN 2.6* 2.5* 2.6* 2.2*   No results for input(s): "LIPASE", "AMYLASE" in the last 168 hours. No results for input(s): "AMMONIA" in the last 168 hours. Coagulation Profile: No results for input(s): "INR", "PROTIME" in the last 168 hours. CBC: Recent Labs  Lab 06/12/22 1832 06/13/22 0451 06/14/22 0313 06/15/22 0356 06/15/22 1243 06/16/22 0355 06/17/22 0401  WBC 17.2* 17.5* 24.3* 25.3*  --  22.1* 21.8*  NEUTROABS 14.7*  --   --   --   --   --   --   HGB 10.8* 9.7* 9.8* 9.5* 11.2* 9.5* 9.5*  HCT 34.4* 31.3* 31.1* 30.2* 33.0* 29.4* 29.5*  MCV 100.6* 100.3* 98.4 98.7  --  97.0 96.4  PLT 209 199 231 237  --  284 290   Cardiac Enzymes: No results for input(s): "CKTOTAL", "CKMB", "CKMBINDEX", "TROPONINI" in the last 168 hours. BNP: Invalid input(s): "POCBNP" CBG: Recent Labs  Lab 06/16/22 2120 06/16/22 2358 06/17/22 0345 06/17/22 0808 06/17/22 1157  GLUCAP 289* 341* 302* 295* 306*   HbA1C: No results for input(s): "HGBA1C" in the last 72  hours. Urine analysis:    Component Value Date/Time   COLORURINE AMBER (A) 05/27/2020 1451   APPEARANCEUR CLOUDY (A) 05/27/2020 1451   LABSPEC 1.017 05/27/2020 1451   PHURINE 6.0 05/27/2020 1451   GLUCOSEU 50 (A) 05/27/2020 1451   HGBUR LARGE (A) 05/27/2020 1451   BILIRUBINUR NEGATIVE 05/27/2020 1451   KETONESUR NEGATIVE 05/27/2020 1451   PROTEINUR 100 (A) 05/27/2020 1451   UROBILINOGEN 0.2 08/02/2010 1534   NITRITE NEGATIVE 05/27/2020 1451   LEUKOCYTESUR SMALL (A) 05/27/2020 1451   Recent Results (from the past 240 hour(s))  MRSA Next Gen by PCR, Nasal     Status: None   Collection Time: 06/13/22  1:30 AM   Specimen: Nasal Mucosa; Nasal Swab  Result Value Ref Range Status   MRSA by PCR Next Gen  NOT DETECTED NOT DETECTED Final    Comment: (NOTE) The GeneXpert MRSA Assay (FDA approved for NASAL specimens only), is one component of a comprehensive MRSA colonization surveillance program. It is not intended to diagnose MRSA infection nor to guide or monitor treatment for MRSA infections. Test performance is not FDA approved in patients less than 28 years old. Performed at [facility], [address] [place], [place] [address]   Aerobic/Anaerobic Culture w Gram Stain (surgical/deep wound)     Status: None (Preliminary result)   Collection Time: 06/15/22  1:44 PM   Specimen: PATH Other; Tissue  Result Value Ref Range Status   Specimen Description ABSCESS  Final   Special Requests PERIRECTAL  Final   Gram Stain   Final    FEW WBC PRESENT, PREDOMINANTLY PMN RARE GRAM POSITIVE COCCI IN CLUSTERS RARE GRAM POSITIVE RODS    Culture   Final    FEW GRAM NEGATIVE RODS IDENTIFICATION AND SUSCEPTIBILITIES TO FOLLOW Performed at [facility], [place] [address], [place], [place] [address]    Report Status PENDING  Incomplete     Scheduled Meds:  Chlorhexidine Gluconate Cloth  6 each Topical Daily   insulin aspart  0-6 Units Subcutaneous Q4H   insulin aspart  5 Units Subcutaneous  TID WC   insulin glargine-yfgn  20 Units Subcutaneous Daily   [START ON 06/18/2022] pantoprazole  40 mg Oral QAC breakfast   polyethylene glycol  17 g Oral Daily   sodium chloride flush  10-40 mL Intracatheter Q12H   sucroferric oxyhydroxide  500 mg Oral BID WC   Continuous Infusions:  sodium chloride     amiodarone 30 mg/hr (06/17/22 0323)   norepinephrine (LEVOPHED) Adult infusion 16 mcg/min (06/17/22 1029)   piperacillin-tazobactam (ZOSYN) 2.25 g in sodium chloride 0.9 % 50 mL IVPB 2.25 g (06/17/22 0534)   vancomycin 200 mL/hr at 06/15/22 1139    Procedures/Studies: DG Chest Port 1 View  Result Date: 06/16/2022 CLINICAL DATA:  Acute respiratory failure with hypoxia and hypercapnia. Patient on vent. EXAM: PORTABLE CHEST 1 VIEW COMPARISON:  AP chest 06/15/2022 (multiple studies), chest two views 01/28/2020 FINDINGS: Endotracheal tube tip terminates approximately 6.3 cm above the carina, at the inferior aspect of the clavicular heads and similar to prior. Enteric tube descends below the diaphragm with the tip and side port overlying the left upper abdominal quadrant. Moderately decreased lung volumes. Right mid and lower lung horizontal linear densities, likely subsegmental atelectasis. No focal left lung airspace opacity. No definite pleural effusion. No pneumothorax. No acute skeletal abnormality. IMPRESSION: 1. Endotracheal tube tip terminates approximately 6.3 cm above the carina and similar to prior. 2. Enteric tube descends below the diaphragm with the tip and side port overlying the left upper abdominal quadrant. 3. Right mid and lower lung linear opacities are similar to prior, probable atelectasis. It is difficult to exclude mild pneumonia. Electronically Signed   By: Yvonne Kendall M.D.   On: 06/16/2022 09:02   DG CHEST PORT 1 VIEW  Result Date: 06/15/2022 CLINICAL DATA:  OG tube placement EXAM: PORTABLE CHEST 1 VIEW COMPARISON:  06/15/2022, 2:47 p.m. FINDINGS: Interval placement of  esophagogastric tube, tip and side port below the diaphragm. Otherwise unchanged rotated AP portable examination with endotracheal tube over the midtrachea, as well as bilateral perihilar heterogeneous and consolidative airspace opacities. No new airspace opacity. IMPRESSION: 1. Interval placement of esophagogastric tube, tip and side port below the diaphragm. 2. Otherwise unchanged rotated AP portable examination with endotracheal tube over  the midtrachea, as well as bilateral perihilar heterogeneous and consolidative airspace opacities. Electronically Signed   By: Delanna Ahmadi M.D.   On: 06/15/2022 18:15   DG Chest Port 1 View  Result Date: 06/15/2022 CLINICAL DATA:  ETT placement EXAM: PORTABLE CHEST 1 VIEW COMPARISON:  Chest x-ray dated July 18th 2021 FINDINGS: ETT tip is proximally 4.5 cm from the carina. Cardiac and mediastinal contours within normal limits. Left perihilar opacities and right mid lung linear opacities, likely due to atelectasis. No pleural effusion or pneumothorax. IMPRESSION: Left perihilar opacities and right mid lung linear opacities, likely due to atelectasis. Electronically Signed   By: Yetta Glassman M.D.   On: 06/15/2022 15:01   ECHOCARDIOGRAM COMPLETE  Result Date: 06/13/2022    ECHOCARDIOGRAM REPORT   Patient Name:   Justin [name] Date of Exam: 06/13/2022 Medical Rec #:  [id number]      Height:       70.0 in Accession #:    [id number]     Weight:       234.0 lb Date of Birth:  1946-07-01      BSA:          2.231 m Patient Age:    71 years       BP:           98/51 mmHg Patient Gender: M              HR:           109 bpm. Exam Location:  Inpatient Procedure: 2D Echo, Color Doppler, Cardiac Doppler and Intracardiac            Opacification Agent Indications:    Atrial Fibrillation I48.91  History:        Patient has prior history of Echocardiogram examinations, most                 recent 01/30/2020. Aortic Valve Disease, Arrythmias:Atrial                 Fibrillation; Risk  Factors:Hypertension and Dyslipidemia. End                 stage renal disease. Severe sepsis.  Sonographer:    Darlina Sicilian RDCS Referring Phys: [phone] [facility]  Sonographer Comments: Suboptimal apical window, suboptimal subcostal window, suboptimal parasternal window and Technically difficult study due to poor echo windows. IMPRESSIONS  1. Left ventricular ejection fraction, by estimation, is 60 to 65%. The left ventricle has normal function. Left ventricular diastolic parameters are indeterminate.  2. Right ventricular systolic function is low normal. The right ventricular size is mildly enlarged. There is moderately elevated pulmonary artery systolic pressure.  3. The mitral valve is grossly normal. No evidence of mitral valve regurgitation. No evidence of mitral stenosis.  4. Tricuspid valve regurgitation is moderate.  5. Poor acoustic windows limit study Assessment of AV also limited by atrial fibrillation     AV is thickened, calcified Peak and mean gradients through the valve are 39 and 21 mm Hg Dimensionless index is 0.33     Overall ocnsistent with moderate AS. COmpared to echo report from 2021, mean gradient is increaed. The aortic valve is calcified. There is mild calcification of the aortic valve. Aortic valve regurgitation is not visualized. Moderate aortic valve stenosis. FINDINGS  Left Ventricle: Left ventricular ejection fraction, by estimation, is 60 to 65%. The left ventricle has normal function. Definity contrast agent was given IV to delineate the left ventricular endocardial borders. The  left ventricular internal cavity size was normal in size. There is no left ventricular hypertrophy. Left ventricular diastolic parameters are indeterminate. Right Ventricle: The right ventricular size is mildly enlarged. No increase in right ventricular wall thickness. Right ventricular systolic function is low normal. There is moderately elevated pulmonary artery systolic pressure. The tricuspid  regurgitant  velocity is 3.30 m/s, and with an assumed right atrial pressure of 8 mmHg, the estimated right ventricular systolic pressure is 16.1 mmHg. Left Atrium: Left atrial size was normal in size. Right Atrium: Right atrial size was normal in size. Pericardium: There is no evidence of pericardial effusion. Mitral Valve: The mitral valve is grossly normal. No evidence of mitral valve regurgitation. No evidence of mitral valve stenosis. Tricuspid Valve: The tricuspid valve is normal in structure. Tricuspid valve regurgitation is moderate . No evidence of tricuspid stenosis. Aortic Valve: Poor acoustic windows limit study Assessment of AV also limited by atrial fibrillation AV is thickened, calcified Peak and mean gradients through the valve are 39 and 21 mm Hg Dimensionless index is 0.33 Overall ocnsistent with moderate AS. COmpared to echo report from 2021, mean gradient is increaed. The aortic valve is calcified. There is mild calcification of the aortic valve. Aortic valve regurgitation is not visualized. Moderate aortic stenosis is present. Aortic valve mean gradient measures 28.0 mmHg. Aortic valve peak gradient measures 44.6 mmHg. Aortic valve area, by VTI measures 1.03 cm. Pulmonic Valve: The pulmonic valve was grossly normal. Pulmonic valve regurgitation is not visualized. No evidence of pulmonic stenosis. Aorta: The aortic root and ascending aorta are structurally normal, with no evidence of dilitation. IAS/Shunts: No atrial level shunt detected by color flow Doppler.  LEFT VENTRICLE PLAX 2D LVIDd:         4.40 cm   Diastology LVIDs:         2.50 cm   LV e' medial:    10.55 cm/s LV PW:         0.90 cm   LV E/e' medial:  12.9 LV IVS:        0.90 cm   LV e' lateral:   13.05 cm/s LVOT diam:     2.00 cm   LV E/e' lateral: 10.5 LV SV:         65 LV SV Index:   29 LVOT Area:     3.14 cm  RIGHT VENTRICLE RV S prime:     13.20 cm/s TAPSE (M-mode): 2.2 cm LEFT ATRIUM           Index LA diam:      4.10 cm 1.84  cm/m LA Vol (A4C): 66.4 ml 29.76 ml/m  AORTIC VALVE AV Area (Vmax):    0.98 cm AV Area (Vmean):   1.03 cm AV Area (VTI):     1.03 cm AV Vmax:           334.00 cm/s AV Vmean:          254.000 cm/s AV VTI:            0.634 m AV Peak Grad:      44.6 mmHg AV Mean Grad:      28.0 mmHg LVOT Vmax:         104.00 cm/s LVOT Vmean:        83.500 cm/s LVOT VTI:          0.207 m LVOT/AV VTI ratio: 0.33  AORTA Ao Root diam: 3.40 cm Ao Asc diam:  3.70 cm MITRAL VALVE  TRICUSPID VALVE MV Area (PHT): 6.83 cm     TR Peak grad:   43.6 mmHg MV Decel Time: 111 msec     TR Vmax:        330.00 cm/s MV E velocity: 136.50 cm/s                             SHUNTS                             Systemic VTI:  0.21 m                             Systemic Diam: 2.00 cm Dorris Carnes MD Electronically signed by Dorris Carnes MD Signature Date/Time: 06/13/2022/2:38:06 PM    Final    CT PELVIS W CONTRAST  Result Date: 06/12/2022 CLINICAL DATA:  Left buttock abscess for 1 month EXAM: CT PELVIS WITH CONTRAST TECHNIQUE: Multidetector CT imaging of the pelvis was performed using the standard protocol following the bolus administration of intravenous contrast. RADIATION DOSE REDUCTION: This exam was performed according to the departmental dose-optimization program which includes automated exposure control, adjustment of the mA and/or kV according to patient size and/or use of iterative reconstruction technique. CONTRAST:  136m OMNIPAQUE IOHEXOL 300 MG/ML  SOLN COMPARISON:  05/05/2022, 05/27/2020 FINDINGS: Urinary Tract: Distal ureters are unremarkable. Bladder is decompressed, limiting its evaluation. Bowel: No bowel obstruction or ileus. Normal appendix right lower quadrant. No bowel wall thickening or inflammatory change. Vascular/Lymphatic: Atherosclerosis of the aorta and its distal branches. No pathologically enlarged lymph nodes within the pelvis. Reproductive: Prostate is enlarged, measuring 5.5 x 4.3 cm. Fiduciary markers are seen  within the prostate. Other: There is no free fluid or free intraperitoneal gas. Fat containing umbilical hernia is noted. No bowel herniation. Musculoskeletal: Multilocular gas and fluid collections are seen within the buttocks, consistent with perianal abscesses. The largest gas and fluid collection within the right gluteal region measures 6.3 x 2.7 cm, reference image 50/2. This extends anteriorly into the perineum. More inferiorly within the buttocks are multilocular fluid collections, measuring up to 2.9 x 2.5 cm on the right and 3.8 x 2.5 cm on the left. I do not see any direct communication with the bowel lumen to suggest fistula. IMPRESSION: 1. Multilocular gas and fluid collection within the perianal region, extending from the perineum into the bilateral inferior buttocks, consistent with abscess. I do not see any communication between the gas and fluid collections and the bowel lumen. 2. Enlarged prostate. 3.  Aortic Atherosclerosis (ICD10-I70.0). Electronically Signed   By: MRanda NgoM.D.   On: 06/12/2022 20:01    CIrwin Brakeman MD  [facility]  If 7PM-7AM, please contact night-coverage [email or web address] Password [email or web address] 06/17/2022, 1:14 PM   LOS: 5 days

## 2022-06-18 ENCOUNTER — Encounter (HOSPITAL_COMMUNITY): Payer: Self-pay | Admitting: General Surgery

## 2022-06-18 DIAGNOSIS — R000 Tachycardia, unspecified: Secondary | ICD-10-CM

## 2022-06-18 DIAGNOSIS — I350 Nonrheumatic aortic (valve) stenosis: Secondary | ICD-10-CM

## 2022-06-18 DIAGNOSIS — I4892 Unspecified atrial flutter: Secondary | ICD-10-CM

## 2022-06-18 LAB — CBC WITH DIFFERENTIAL/PLATELET
Abs Immature Granulocytes: 0.3 10*3/uL — ABNORMAL HIGH (ref 0.00–0.07)
Basophils Absolute: 0 10*3/uL (ref 0.0–0.1)
Basophils Relative: 0 %
Eosinophils Absolute: 0.2 10*3/uL (ref 0.0–0.5)
Eosinophils Relative: 1 %
HCT: 31 % — ABNORMAL LOW (ref 39.0–52.0)
Hemoglobin: 9.8 g/dL — ABNORMAL LOW (ref 13.0–17.0)
Immature Granulocytes: 2 %
Lymphocytes Relative: 10 %
Lymphs Abs: 1.7 10*3/uL (ref 0.7–4.0)
MCH: 30.3 pg (ref 26.0–34.0)
MCHC: 31.6 g/dL (ref 30.0–36.0)
MCV: 96 fL (ref 80.0–100.0)
Monocytes Absolute: 1.6 10*3/uL — ABNORMAL HIGH (ref 0.1–1.0)
Monocytes Relative: 9 %
Neutro Abs: 13.7 10*3/uL — ABNORMAL HIGH (ref 1.7–7.7)
Neutrophils Relative %: 78 %
Platelets: 338 10*3/uL (ref 150–400)
RBC: 3.23 MIL/uL — ABNORMAL LOW (ref 4.22–5.81)
RDW: 14.5 % (ref 11.5–15.5)
WBC: 17.5 10*3/uL — ABNORMAL HIGH (ref 4.0–10.5)
nRBC: 0.1 % (ref 0.0–0.2)

## 2022-06-18 LAB — GLUCOSE, CAPILLARY
Glucose-Capillary: 202 mg/dL — ABNORMAL HIGH (ref 70–99)
Glucose-Capillary: 214 mg/dL — ABNORMAL HIGH (ref 70–99)
Glucose-Capillary: 218 mg/dL — ABNORMAL HIGH (ref 70–99)
Glucose-Capillary: 244 mg/dL — ABNORMAL HIGH (ref 70–99)
Glucose-Capillary: 248 mg/dL — ABNORMAL HIGH (ref 70–99)
Glucose-Capillary: 256 mg/dL — ABNORMAL HIGH (ref 70–99)

## 2022-06-18 LAB — RENAL FUNCTION PANEL
Albumin: 2.3 g/dL — ABNORMAL LOW (ref 3.5–5.0)
Anion gap: 13 (ref 5–15)
BUN: 52 mg/dL — ABNORMAL HIGH (ref 8–23)
CO2: 26 mmol/L (ref 22–32)
Calcium: 8.1 mg/dL — ABNORMAL LOW (ref 8.9–10.3)
Chloride: 94 mmol/L — ABNORMAL LOW (ref 98–111)
Creatinine, Ser: 7.48 mg/dL — ABNORMAL HIGH (ref 0.61–1.24)
GFR, Estimated: 7 mL/min — ABNORMAL LOW (ref >60)
Glucose, Bld: 242 mg/dL — ABNORMAL HIGH (ref 70–99)
Phosphorus: 4.5 mg/dL (ref 2.5–4.6)
Potassium: 3.8 mmol/L (ref 3.5–5.1)
Sodium: 133 mmol/L — ABNORMAL LOW (ref 135–145)

## 2022-06-18 LAB — MAGNESIUM: Magnesium: 1.9 mg/dL (ref 1.7–2.4)

## 2022-06-18 MED ORDER — INSULIN ASPART 100 UNIT/ML IJ SOLN
10.0000 [IU] | Freq: Three times a day (TID) | INTRAMUSCULAR | Status: DC
  Administered 2022-06-18 (×3): 10 [IU] via SUBCUTANEOUS

## 2022-06-18 MED ORDER — IPRATROPIUM-ALBUTEROL 0.5-2.5 (3) MG/3ML IN SOLN
3.0000 mL | RESPIRATORY_TRACT | Status: DC | PRN
  Administered 2022-06-18: 3 mL via RESPIRATORY_TRACT

## 2022-06-18 MED ORDER — AMIODARONE HCL 200 MG PO TABS
200.0000 mg | ORAL_TABLET | Freq: Two times a day (BID) | ORAL | Status: DC
  Administered 2022-06-18 – 2022-06-26 (×17): 200 mg via ORAL
  Filled 2022-06-18 (×17): qty 1

## 2022-06-18 MED ORDER — INSULIN ASPART 100 UNIT/ML IJ SOLN
0.0000 [IU] | Freq: Three times a day (TID) | INTRAMUSCULAR | Status: DC
  Administered 2022-06-19 (×2): 1 [IU] via SUBCUTANEOUS
  Administered 2022-06-21 – 2022-06-23 (×2): 2 [IU] via SUBCUTANEOUS
  Administered 2022-06-23: 1 [IU] via SUBCUTANEOUS
  Administered 2022-06-23: 2 [IU] via SUBCUTANEOUS
  Administered 2022-06-24 – 2022-06-25 (×2): 1 [IU] via SUBCUTANEOUS

## 2022-06-18 MED ORDER — MIDODRINE HCL 5 MG PO TABS
10.0000 mg | ORAL_TABLET | Freq: Three times a day (TID) | ORAL | Status: DC
  Administered 2022-06-18 – 2022-06-20 (×8): 10 mg via ORAL
  Filled 2022-06-18 (×8): qty 2

## 2022-06-18 MED ORDER — IPRATROPIUM-ALBUTEROL 0.5-2.5 (3) MG/3ML IN SOLN
RESPIRATORY_TRACT | Status: AC
  Filled 2022-06-18: qty 3

## 2022-06-18 MED ORDER — INSULIN GLARGINE-YFGN 100 UNIT/ML ~~LOC~~ SOLN
30.0000 [IU] | Freq: Every day | SUBCUTANEOUS | Status: DC
  Administered 2022-06-18: 30 [IU] via SUBCUTANEOUS
  Filled 2022-06-18 (×2): qty 0.3

## 2022-06-18 NOTE — Progress Notes (Addendum)
PROGRESS NOTE  Justin [name] IRC:[id number] DOB: 21-Sep-1945 DOA: 06/12/2022 PCP: Michell Heinrich, DO   Brief History:  76 year old male with a history of ESRD (MWF) atrial fibrillation/atrial flutter, hypertension, hyperlipidemia, prediabetes, tobacco abuse in remission presenting with 1 month history of buttock pain, right greater than left.  Patient stated that it started as a boil that subsequently burst.  After testing, he began noticing worsening pain in his perianal region with sitting.  It has progressively gotten worse.  He denies any fevers, chills, chest pain, shortness of breath, coughing, hemoptysis, nausea, vomiting, direct abdominal pain.  He has not been on any recent antibiotics.  Because of his worsening pain, the patient presented for further evaluation and treatment.  He last had dialysis on 06/12/2022.  In the ED, the patient had a low-grade temperature 100.2 F.  He was initially hypotensive with a blood pressure of 86/43.  WBC 17.2, hemoglobin 10.8, platelets 199,000.  Sodium 140, potassium 3.5, bicarbonate 32, serum creatinine 5.32.  CT of the abdomen and pelvis showed a multiloculated/fluid collection and gas collection in the perianal region from the perineum to the inferior buttocks bilaterally.  The patient was started on vancomycin and Zosyn.  General surgery was consulted to assist with management.  The patient's apixaban was held in preparation for surgery.   Assessment/Plan: Severe sepsis -Secondary to perirectal abscess -Presented with tachycardia, leukocytosis, and lactate 2.7 -06/14/22 arterial line inserted -06/14/22  CVL inserted by Dr. Arnoldo Morale -levophed 8-9 mcg/kg/min>>2 mcg/kg -Continue vancomycin and Zosyn - he remains on IV pressor support, wean as able  - nephrology team started midodrine 10 mg TID to assist with weaning norepinephrine  Perirectal abscess -General surgery consulted -restart apixaban when ok with surgery -Echocardiogram EF 60-65%,  low normal RVF, mod TR -I&D on 06/15/2022>>GNR and GPC clusters (ID pending) -surgery starting hydrogen peroxide irrigation treatments   Wide Complex tachycardia -?Vtach -Wide complex tachcyardia with thready pulse, received 15 seconds of CPR. Another short 5 second episode while in OR -appreciate cardiology--discussed with Dr. Harl Bowie -on amio drip per cardiology   Acute respiratory Failure with hypoxia -left intubated post-op -personally reviewed CXR 12/4--no edema or infiltrates -Dr. Melvyn Novas consulted -06/16/22--extubated -now on 3L [place] with 100% sat   Hyperglycemia  - BS remain elevated in setting of sepsis, critical illness - increased basal coverage to 30 units of semglee - increased prandial coverage to 10 units TID with meals plus SSI coverage CBG (last 3)  Recent Labs    06/18/22 0050 06/18/22 0423 06/18/22 0758  GLUCAP 248* 256* 218*   ESRD -He dialyzes on Monday Wednesday Friday -Nephrology consulted for next dialysis -Continue Velphoro and Sensipar -HD managed by nephrology service -next HD 12/8   Atrial flutter -Holding apixaban in preparation for surgery -treated with amiodarone gtt and on 12/7 transition to oral amiodarone -cardiology recommending amiodarone 200 mg BID x 3 weeks followed by 200 mg daily   Mixed hyperlipidemia -Continue statin   Essential hypertension -Holding metoprolol secondary to hypotension   Obesity -BMI 33.58 -lifestyle modification      Family Communication: no family at bedside 12/7, discussed plan of care with patient who verbalized understanding   Consultants:  renal, general surgery, cardiology, PCCM   Code Status:  FULL    DVT Prophylaxis:  apixaban on hold     Procedures: As Listed in Progress Note Above   Antibiotics: Vanc 12/1>> Zosyn 12/1>>  Subjective: Pt voices no specific complaints.  Objective: Vitals:   06/18/22 0415 06/18/22 0430 06/18/22 0500 06/18/22 0748  BP: (!) 92/44 (!) 113/96    Pulse:     98  Resp: (!) 24 20  (!) 21  Temp:    97.6 F (36.4 C)  TempSrc:    Oral  SpO2:    100%  Weight:   111.3 kg   Height:        Intake/Output Summary (Last 24 hours) at 06/18/2022 0905 Last data filed at 06/18/2022 7829 Gross per 24 hour  Intake 2283.68 ml  Output 1000 ml  Net 1283.68 ml   Weight change: 0 kg Exam:  General:  Pt is awake, alert, follows commands appropriately, not in acute distress HEENT: No icterus, No thrush, No neck mass, [place]/AT Cardiovascular: normal S1/S2, no rubs, no gallops Respiratory: rare crackles. No wheeze Abdomen: Soft/+BS, non tender, non distended, no guarding Extremities: No edema, No lymphangitis, No petechiae, No rashes, no synovitis  Data Reviewed: I have personally reviewed following labs and imaging studies Basic Metabolic Panel: Recent Labs  Lab 06/13/22 0451 06/14/22 0313 06/15/22 0356 06/15/22 1243 06/16/22 0355 06/17/22 0401 06/18/22 0423  NA 138 134* 136 134* 133* 135 133*  K 3.6 3.8 4.4 3.7 4.7 4.7 3.8  CL 96* 93* 94* 93* 94* 93* 94*  CO2 '30 27 26  '$ --  '24 25 26  '$ GLUCOSE 164* 289* 196* 177* 275* 306* 242*  BUN 29* 42* 55* 26* 43* 68* 52*  CREATININE 6.37* 8.39* 10.39* 6.40* 7.56* 9.37* 7.48*  CALCIUM 8.3* 7.9* 8.3*  --  8.8* 8.3* 8.1*  MG 2.0  --   --   --  1.9  --  1.9  PHOS 4.6 3.8 5.1*  --   --  6.4* 4.5   Liver Function Tests: Recent Labs  Lab 06/13/22 0451 06/14/22 0313 06/15/22 0356 06/17/22 0401 06/18/22 0423  AST 20  --   --   --   --   ALT 14  --   --   --   --   ALKPHOS 65  --   --   --   --   BILITOT 0.8  --   --   --   --   PROT 7.0  --   --   --   --   ALBUMIN 2.6* 2.5* 2.6* 2.2* 2.3*   No results for input(s): "LIPASE", "AMYLASE" in the last 168 hours. No results for input(s): "AMMONIA" in the last 168 hours. Coagulation Profile: No results for input(s): "INR", "PROTIME" in the last 168 hours. CBC: Recent Labs  Lab 06/12/22 1832 06/13/22 0451 06/14/22 0313 06/15/22 0356 06/15/22 1243  06/16/22 0355 06/17/22 0401 06/18/22 0423  WBC 17.2*   < > 24.3* 25.3*  --  22.1* 21.8* 17.5*  NEUTROABS 14.7*  --   --   --   --   --   --  13.7*  HGB 10.8*   < > 9.8* 9.5* 11.2* 9.5* 9.5* 9.8*  HCT 34.4*   < > 31.1* 30.2* 33.0* 29.4* 29.5* 31.0*  MCV 100.6*   < > 98.4 98.7  --  97.0 96.4 96.0  PLT 209   < > 231 237  --  284 290 338   < > = values in this interval not displayed.   Cardiac Enzymes: No results for input(s): "CKTOTAL", "CKMB", "CKMBINDEX", "TROPONINI" in the last 168 hours. BNP: Invalid input(s): "POCBNP" CBG: Recent Labs  Lab 06/17/22 1813 06/17/22 2143 06/18/22 0050 06/18/22 0423 06/18/22  [place]* 256* 218*   HbA1C: Recent Labs    06/16/22 0355  HGBA1C 6.5*   Urine analysis:    Component Value Date/Time   COLORURINE AMBER (A) 05/27/2020 1451   APPEARANCEUR CLOUDY (A) 05/27/2020 1451   LABSPEC 1.017 05/27/2020 1451   PHURINE 6.0 05/27/2020 1451   GLUCOSEU 50 (A) 05/27/2020 1451   HGBUR LARGE (A) 05/27/2020 1451   BILIRUBINUR NEGATIVE 05/27/2020 1451   KETONESUR NEGATIVE 05/27/2020 1451   PROTEINUR 100 (A) 05/27/2020 1451   UROBILINOGEN 0.2 08/02/2010 1534   NITRITE NEGATIVE 05/27/2020 1451   LEUKOCYTESUR SMALL (A) 05/27/2020 1451   Recent Results (from the past 240 hour(s))  MRSA Next Gen by PCR, Nasal     Status: None   Collection Time: 06/13/22  1:30 AM   Specimen: Nasal Mucosa; Nasal Swab  Result Value Ref Range Status   MRSA by PCR Next Gen NOT DETECTED NOT DETECTED Final    Comment: (NOTE) The GeneXpert MRSA Assay (FDA approved for NASAL specimens only), is one component of a comprehensive MRSA colonization surveillance program. It is not intended to diagnose MRSA infection nor to guide or monitor treatment for MRSA infections. Test performance is not FDA approved in patients less than 62 years old. Performed at [facility], [address] [place], [place] [address]   Aerobic/Anaerobic Culture w Gram Stain  (surgical/deep wound)     Status: None (Preliminary result)   Collection Time: 06/15/22  1:44 PM   Specimen: PATH Other; Tissue  Result Value Ref Range Status   Specimen Description ABSCESS  Final   Special Requests PERIRECTAL  Final   Gram Stain   Final    FEW WBC PRESENT, PREDOMINANTLY PMN RARE GRAM POSITIVE COCCI IN CLUSTERS RARE GRAM POSITIVE RODS    Culture   Final    FEW GRAM NEGATIVE RODS IDENTIFICATION AND SUSCEPTIBILITIES TO FOLLOW Performed at [facility], [place] [address] [place], [place] [address]    Report Status PENDING  Incomplete     Scheduled Meds:  amiodarone  200 mg Oral BID   Chlorhexidine Gluconate Cloth  6 each Topical Daily   hydrogen peroxide 3 % 10 Application in sodium chloride irrigation 0.9 % 500 mL irrigation   Irrigation Daily   insulin aspart  0-6 Units Subcutaneous Q4H   insulin aspart  10 Units Subcutaneous TID WC   insulin glargine-yfgn  30 Units Subcutaneous Daily   midodrine  10 mg Oral TID WC   pantoprazole  40 mg Oral QAC breakfast   polyethylene glycol  17 g Oral Daily   sodium chloride flush  10-40 mL Intracatheter Q12H   sucroferric oxyhydroxide  500 mg Oral BID WC   Continuous Infusions:  sodium chloride     norepinephrine (LEVOPHED) Adult infusion 17 mcg/min (06/18/22 0712)   piperacillin-tazobactam (ZOSYN) 2.25 g in sodium chloride 0.9 % 50 mL IVPB Stopped (06/18/22 0619)   vancomycin 200 mL/hr at 06/18/22 8657    Procedures/Studies: DG Chest Port 1 View  Result Date: 06/16/2022 CLINICAL DATA:  Acute respiratory failure with hypoxia and hypercapnia. Patient on vent. EXAM: PORTABLE CHEST 1 VIEW COMPARISON:  AP chest 06/15/2022 (multiple studies), chest two views 01/28/2020 FINDINGS: Endotracheal tube tip terminates approximately 6.3 cm above the carina, at the inferior aspect of the clavicular heads and similar to prior. Enteric tube descends below the diaphragm with the tip and side port overlying the left upper abdominal  quadrant. Moderately decreased lung volumes. Right mid  and lower lung horizontal linear densities, likely subsegmental atelectasis. No focal left lung airspace opacity. No definite pleural effusion. No pneumothorax. No acute skeletal abnormality. IMPRESSION: 1. Endotracheal tube tip terminates approximately 6.3 cm above the carina and similar to prior. 2. Enteric tube descends below the diaphragm with the tip and side port overlying the left upper abdominal quadrant. 3. Right mid and lower lung linear opacities are similar to prior, probable atelectasis. It is difficult to exclude mild pneumonia. Electronically Signed   By: Yvonne Kendall M.D.   On: 06/16/2022 09:02   DG CHEST PORT 1 VIEW  Result Date: 06/15/2022 CLINICAL DATA:  OG tube placement EXAM: PORTABLE CHEST 1 VIEW COMPARISON:  06/15/2022, 2:47 p.m. FINDINGS: Interval placement of esophagogastric tube, tip and side port below the diaphragm. Otherwise unchanged rotated AP portable examination with endotracheal tube over the midtrachea, as well as bilateral perihilar heterogeneous and consolidative airspace opacities. No new airspace opacity. IMPRESSION: 1. Interval placement of esophagogastric tube, tip and side port below the diaphragm. 2. Otherwise unchanged rotated AP portable examination with endotracheal tube over the midtrachea, as well as bilateral perihilar heterogeneous and consolidative airspace opacities. Electronically Signed   By: Delanna Ahmadi M.D.   On: 06/15/2022 18:15   DG Chest Port 1 View  Result Date: 06/15/2022 CLINICAL DATA:  ETT placement EXAM: PORTABLE CHEST 1 VIEW COMPARISON:  Chest x-ray dated July 18th 2021 FINDINGS: ETT tip is proximally 4.5 cm from the carina. Cardiac and mediastinal contours within normal limits. Left perihilar opacities and right mid lung linear opacities, likely due to atelectasis. No pleural effusion or pneumothorax. IMPRESSION: Left perihilar opacities and right mid lung linear opacities, likely due  to atelectasis. Electronically Signed   By: Yetta Glassman M.D.   On: 06/15/2022 15:01   ECHOCARDIOGRAM COMPLETE  Result Date: 06/13/2022    ECHOCARDIOGRAM REPORT   Patient Name:   Justin [name] Date of Exam: 06/13/2022 Medical Rec #:  [id number]      Height:       70.0 in Accession #:    [id number]     Weight:       234.0 lb Date of Birth:  07-12-1946      BSA:          2.231 m Patient Age:    53 years       BP:           98/51 mmHg Patient Gender: M              HR:           109 bpm. Exam Location:  Inpatient Procedure: 2D Echo, Color Doppler, Cardiac Doppler and Intracardiac            Opacification Agent Indications:    Atrial Fibrillation I48.91  History:        Patient has prior history of Echocardiogram examinations, most                 recent 01/30/2020. Aortic Valve Disease, Arrythmias:Atrial                 Fibrillation; Risk Factors:Hypertension and Dyslipidemia. End                 stage renal disease. Severe sepsis.  Sonographer:    Darlina Sicilian RDCS Referring Phys: [phone] [facility]  Sonographer Comments: Suboptimal apical window, suboptimal subcostal window, suboptimal parasternal window and Technically difficult study due to poor echo windows. IMPRESSIONS  1. Left ventricular  ejection fraction, by estimation, is 60 to 65%. The left ventricle has normal function. Left ventricular diastolic parameters are indeterminate.  2. Right ventricular systolic function is low normal. The right ventricular size is mildly enlarged. There is moderately elevated pulmonary artery systolic pressure.  3. The mitral valve is grossly normal. No evidence of mitral valve regurgitation. No evidence of mitral stenosis.  4. Tricuspid valve regurgitation is moderate.  5. Poor acoustic windows limit study Assessment of AV also limited by atrial fibrillation     AV is thickened, calcified Peak and mean gradients through the valve are 39 and 21 mm Hg Dimensionless index is 0.33     Overall ocnsistent with moderate AS.  COmpared to echo report from 2021, mean gradient is increaed. The aortic valve is calcified. There is mild calcification of the aortic valve. Aortic valve regurgitation is not visualized. Moderate aortic valve stenosis. FINDINGS  Left Ventricle: Left ventricular ejection fraction, by estimation, is 60 to 65%. The left ventricle has normal function. Definity contrast agent was given IV to delineate the left ventricular endocardial borders. The left ventricular internal cavity size was normal in size. There is no left ventricular hypertrophy. Left ventricular diastolic parameters are indeterminate. Right Ventricle: The right ventricular size is mildly enlarged. No increase in right ventricular wall thickness. Right ventricular systolic function is low normal. There is moderately elevated pulmonary artery systolic pressure. The tricuspid regurgitant  velocity is 3.30 m/s, and with an assumed right atrial pressure of 8 mmHg, the estimated right ventricular systolic pressure is 38.7 mmHg. Left Atrium: Left atrial size was normal in size. Right Atrium: Right atrial size was normal in size. Pericardium: There is no evidence of pericardial effusion. Mitral Valve: The mitral valve is grossly normal. No evidence of mitral valve regurgitation. No evidence of mitral valve stenosis. Tricuspid Valve: The tricuspid valve is normal in structure. Tricuspid valve regurgitation is moderate . No evidence of tricuspid stenosis. Aortic Valve: Poor acoustic windows limit study Assessment of AV also limited by atrial fibrillation AV is thickened, calcified Peak and mean gradients through the valve are 39 and 21 mm Hg Dimensionless index is 0.33 Overall ocnsistent with moderate AS. COmpared to echo report from 2021, mean gradient is increaed. The aortic valve is calcified. There is mild calcification of the aortic valve. Aortic valve regurgitation is not visualized. Moderate aortic stenosis is present. Aortic valve mean gradient measures  28.0 mmHg. Aortic valve peak gradient measures 44.6 mmHg. Aortic valve area, by VTI measures 1.03 cm. Pulmonic Valve: The pulmonic valve was grossly normal. Pulmonic valve regurgitation is not visualized. No evidence of pulmonic stenosis. Aorta: The aortic root and ascending aorta are structurally normal, with no evidence of dilitation. IAS/Shunts: No atrial level shunt detected by color flow Doppler.  LEFT VENTRICLE PLAX 2D LVIDd:         4.40 cm   Diastology LVIDs:         2.50 cm   LV e' medial:    10.55 cm/s LV PW:         0.90 cm   LV E/e' medial:  12.9 LV IVS:        0.90 cm   LV e' lateral:   13.05 cm/s LVOT diam:     2.00 cm   LV E/e' lateral: 10.5 LV SV:         65 LV SV Index:   29 LVOT Area:     3.14 cm  RIGHT VENTRICLE RV S prime:  13.20 cm/s TAPSE (M-mode): 2.2 cm LEFT ATRIUM           Index LA diam:      4.10 cm 1.84 cm/m LA Vol (A4C): 66.4 ml 29.76 ml/m  AORTIC VALVE AV Area (Vmax):    0.98 cm AV Area (Vmean):   1.03 cm AV Area (VTI):     1.03 cm AV Vmax:           334.00 cm/s AV Vmean:          254.000 cm/s AV VTI:            0.634 m AV Peak Grad:      44.6 mmHg AV Mean Grad:      28.0 mmHg LVOT Vmax:         104.00 cm/s LVOT Vmean:        83.500 cm/s LVOT VTI:          0.207 m LVOT/AV VTI ratio: 0.33  AORTA Ao Root diam: 3.40 cm Ao Asc diam:  3.70 cm MITRAL VALVE                TRICUSPID VALVE MV Area (PHT): 6.83 cm     TR Peak grad:   43.6 mmHg MV Decel Time: 111 msec     TR Vmax:        330.00 cm/s MV E velocity: 136.50 cm/s                             SHUNTS                             Systemic VTI:  0.21 m                             Systemic Diam: 2.00 cm Dorris Carnes MD Electronically signed by Dorris Carnes MD Signature Date/Time: 06/13/2022/2:38:06 PM    Final    CT PELVIS W CONTRAST  Result Date: 06/12/2022 CLINICAL DATA:  Left buttock abscess for 1 month EXAM: CT PELVIS WITH CONTRAST TECHNIQUE: Multidetector CT imaging of the pelvis was performed using the standard protocol  following the bolus administration of intravenous contrast. RADIATION DOSE REDUCTION: This exam was performed according to the departmental dose-optimization program which includes automated exposure control, adjustment of the mA and/or kV according to patient size and/or use of iterative reconstruction technique. CONTRAST:  114m OMNIPAQUE IOHEXOL 300 MG/ML  SOLN COMPARISON:  05/05/2022, 05/27/2020 FINDINGS: Urinary Tract: Distal ureters are unremarkable. Bladder is decompressed, limiting its evaluation. Bowel: No bowel obstruction or ileus. Normal appendix right lower quadrant. No bowel wall thickening or inflammatory change. Vascular/Lymphatic: Atherosclerosis of the aorta and its distal branches. No pathologically enlarged lymph nodes within the pelvis. Reproductive: Prostate is enlarged, measuring 5.5 x 4.3 cm. Fiduciary markers are seen within the prostate. Other: There is no free fluid or free intraperitoneal gas. Fat containing umbilical hernia is noted. No bowel herniation. Musculoskeletal: Multilocular gas and fluid collections are seen within the buttocks, consistent with perianal abscesses. The largest gas and fluid collection within the right gluteal region measures 6.3 x 2.7 cm, reference image 50/2. This extends anteriorly into the perineum. More inferiorly within the buttocks are multilocular fluid collections, measuring up to 2.9 x 2.5 cm on the right and 3.8 x 2.5 cm on the left. I do not see any direct communication  with the bowel lumen to suggest fistula. IMPRESSION: 1. Multilocular gas and fluid collection within the perianal region, extending from the perineum into the bilateral inferior buttocks, consistent with abscess. I do not see any communication between the gas and fluid collections and the bowel lumen. 2. Enlarged prostate. 3.  Aortic Atherosclerosis (ICD10-I70.0). Electronically Signed   By: Randa Ngo M.D.   On: 06/12/2022 20:01    Irwin Brakeman, MD  [facility]  If  7PM-7AM, please contact night-coverage [email or web address] Password [email or web address] 06/18/2022, 9:05 AM   LOS: 6 days

## 2022-06-18 NOTE — Progress Notes (Signed)
Tele reviewed, in rate controlled aflutter. No recurrent wide complex tachycardia since amiodarone drip was started in postop period. Patient extubated, start oral amio '200mg'$  bid x 3 weeks then '200mg'$  daily. Restart anticoag when ok from surgery and primary team standpoint. No additional cardiology recs at this time, will need to f/u with his regular cardiology team at [facility] at discharge. We will sign off inpatient care.   Carlyle Dolly MD

## 2022-06-18 NOTE — Progress Notes (Signed)
Patient denies any significant rectal pain.  He has been switched to hydrogen peroxide irrigation of the wounds.  Leukocytosis is trending downward.  ID of abscess still pending.  Hopefully his Levophed can be weaned off and the femoral line removed.

## 2022-06-18 NOTE — Progress Notes (Signed)
Pharmacy Antibiotic Note  Justin [name] is a 76 y.o. male admitted on 06/12/2022 with  wound infection .  Pharmacy has been consulted for vanc/zosyn dosing.  ESRD pt who presented with wound infection. Vanc/zosyn ordered empirically.   Plan: Continue Vancomycin 1000 mg IV every MWF w/ HD Continue  Zosyn 2.25g IV q8 Monitor cultures and vanco level if continued.   Height: '5\' 10"'$  (177.8 cm) Weight: 111.3 kg (245 lb 6 oz) IBW/kg (Calculated) : 73  Temp (24hrs), Avg:97.6 F (36.4 C), Min:97.3 F (36.3 C), Max:97.8 F (36.6 C)  Recent Labs  Lab 06/14/22 0313 06/15/22 0356 06/15/22 1243 06/16/22 0355 06/17/22 0401 06/18/22 0423  WBC 24.3* 25.3*  --  22.1* 21.8* 17.5*  CREATININE 8.39* 10.39* 6.40* 7.56* 9.37* 7.48*     Estimated Creatinine Clearance: 10.2 mL/min (A) (by C-G formula based on SCr of 7.48 mg/dL (H)).    No Known Allergies  Antimicrobials this admission: 12/1 vanc>> 12/1 zosyn>>   Microbiology results: 12/4 Abscess cx: gram + cocci and gram neg rods- still pending MRSA PCR: neg   Margot Ables, PharmD Clinical Pharmacist 06/18/2022 11:49 AM

## 2022-06-18 NOTE — Progress Notes (Signed)
[facility] Progress Note   Assessment/ Plan:     1. Severe sepsis/ perirectal abscess: s/p I and D 12/4 with gen surg.  Vanc/ Zosyn.  Still on Norepinephrine- will add midodrine 10 mg TID to see if we can wean down.  D/w bedside RN  2. ESRD:  MWF, using AVF.  HD yesterday, UF goal achieved.  Next HD tomorrow 06/19/22.  3. 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 Nutrition: renal diet with fluid restriction  6. S/p cardiac arrest- brief, in OR, ROSC achieved, Vtach  7.  Afib: on amio and OP AC    Subjective:    HD yesterday- 1L UF achieved.  NE at 17 mcg/ min.  Sitting up, eating breakfast.       Objective:   BP (!) 113/96   Pulse 98   Temp 97.6 F (36.4 C) (Oral)   Resp (!) 21   Ht '5\' 10"'$  (1.778 m)   Wt 111.3 kg   SpO2 100%   BMI 35.21 kg/m   Physical Exam: Gen:NAD, lying in bed CVS: irregular, soft systolic murmur Resp: clear anteriorly Abd: soft, nontender, NABS Ext: 1+ LE edema ACCESS: L AVF + T/B  Labs: BMET Recent Labs  Lab 06/12/22 1832 06/13/22 0451 06/14/22 0313 06/15/22 0356 06/15/22 1243 06/16/22 0355 06/17/22 0401 06/18/22 0423  NA 140 138 134* 136 134* 133* 135 133*  K 3.5 3.6 3.8 4.4 3.7 4.7 4.7 3.8  CL 95* 96* 93* 94* 93* 94* 93* 94*  CO2 32 '30 27 26  '$ --  '24 25 26  '$ GLUCOSE 154* 164* 289* 196* 177* 275* 306* 242*  BUN 21 29* 42* 55* 26* 43* 68* 52*  CREATININE 5.32* 6.37* 8.39* 10.39* 6.40* 7.56* 9.37* 7.48*  CALCIUM 8.6* 8.3* 7.9* 8.3*  --  8.8* 8.3* 8.1*  PHOS  --  4.6 3.8 5.1*  --   --  6.4* 4.5   CBC Recent Labs  Lab 06/12/22 1832 06/13/22 0451 06/15/22 0356 06/15/22 1243 06/16/22 0355 06/17/22 0401 06/18/22 0423  WBC 17.2*   < > 25.3*  --  22.1* 21.8* 17.5*  NEUTROABS 14.7*  --   --   --   --   --  13.7*  HGB 10.8*   < > 9.5* 11.2* 9.5* 9.5* 9.8*  HCT 34.4*   < > 30.2* 33.0* 29.4* 29.5* 31.0*  MCV 100.6*   < > 98.7  --  97.0 96.4 96.0  PLT 209   < > 237  --  284 290 338   < > =  values in this interval not displayed.      Medications:     amiodarone  200 mg Oral BID   Chlorhexidine Gluconate Cloth  6 each Topical Daily   hydrogen peroxide 3 % 10 Application in sodium chloride irrigation 0.9 % 500 mL irrigation   Irrigation Daily   insulin aspart  0-6 Units Subcutaneous Q4H   insulin aspart  10 Units Subcutaneous TID WC   insulin glargine-yfgn  30 Units Subcutaneous Daily   midodrine  10 mg Oral TID WC   pantoprazole  40 mg Oral QAC breakfast   polyethylene glycol  17 g Oral Daily   sodium chloride flush  10-40 mL Intracatheter Q12H   sucroferric oxyhydroxide  500 mg Oral BID WC     Madelon Lips, MD 06/18/2022, 8:03 AM

## 2022-06-19 DIAGNOSIS — I4892 Unspecified atrial flutter: Secondary | ICD-10-CM | POA: Diagnosis not present

## 2022-06-19 DIAGNOSIS — I350 Nonrheumatic aortic (valve) stenosis: Secondary | ICD-10-CM | POA: Diagnosis not present

## 2022-06-19 DIAGNOSIS — R000 Tachycardia, unspecified: Secondary | ICD-10-CM | POA: Diagnosis not present

## 2022-06-19 LAB — CBC WITH DIFFERENTIAL/PLATELET
Abs Immature Granulocytes: 0.36 10*3/uL — ABNORMAL HIGH (ref 0.00–0.07)
Basophils Absolute: 0.1 10*3/uL (ref 0.0–0.1)
Basophils Relative: 0 %
Eosinophils Absolute: 0.3 10*3/uL (ref 0.0–0.5)
Eosinophils Relative: 2 %
HCT: 29.9 % — ABNORMAL LOW (ref 39.0–52.0)
Hemoglobin: 9.4 g/dL — ABNORMAL LOW (ref 13.0–17.0)
Immature Granulocytes: 2 %
Lymphocytes Relative: 11 %
Lymphs Abs: 1.9 10*3/uL (ref 0.7–4.0)
MCH: 30.4 pg (ref 26.0–34.0)
MCHC: 31.4 g/dL (ref 30.0–36.0)
MCV: 96.8 fL (ref 80.0–100.0)
Monocytes Absolute: 1.6 10*3/uL — ABNORMAL HIGH (ref 0.1–1.0)
Monocytes Relative: 9 %
Neutro Abs: 13.3 10*3/uL — ABNORMAL HIGH (ref 1.7–7.7)
Neutrophils Relative %: 76 %
Platelets: 318 10*3/uL (ref 150–400)
RBC: 3.09 MIL/uL — ABNORMAL LOW (ref 4.22–5.81)
RDW: 14.7 % (ref 11.5–15.5)
WBC: 17.5 10*3/uL — ABNORMAL HIGH (ref 4.0–10.5)
nRBC: 0.1 % (ref 0.0–0.2)

## 2022-06-19 LAB — RENAL FUNCTION PANEL
Albumin: 2.2 g/dL — ABNORMAL LOW (ref 3.5–5.0)
Anion gap: 12 (ref 5–15)
BUN: 61 mg/dL — ABNORMAL HIGH (ref 8–23)
CO2: 26 mmol/L (ref 22–32)
Calcium: 8.1 mg/dL — ABNORMAL LOW (ref 8.9–10.3)
Chloride: 94 mmol/L — ABNORMAL LOW (ref 98–111)
Creatinine, Ser: 9.05 mg/dL — ABNORMAL HIGH (ref 0.61–1.24)
GFR, Estimated: 5 mL/min — ABNORMAL LOW (ref >60)
Glucose, Bld: 209 mg/dL — ABNORMAL HIGH (ref 70–99)
Phosphorus: 4.8 mg/dL — ABNORMAL HIGH (ref 2.5–4.6)
Potassium: 3.9 mmol/L (ref 3.5–5.1)
Sodium: 132 mmol/L — ABNORMAL LOW (ref 135–145)

## 2022-06-19 LAB — GLUCOSE, CAPILLARY
Glucose-Capillary: 214 mg/dL — ABNORMAL HIGH (ref 70–99)
Glucose-Capillary: 182 mg/dL — ABNORMAL HIGH (ref 70–99)
Glucose-Capillary: 188 mg/dL — ABNORMAL HIGH (ref 70–99)
Glucose-Capillary: 73 mg/dL (ref 70–99)
Glucose-Capillary: 111 mg/dL — ABNORMAL HIGH (ref 70–99)

## 2022-06-19 MED ORDER — INSULIN ASPART 100 UNIT/ML IJ SOLN
12.0000 [IU] | Freq: Three times a day (TID) | INTRAMUSCULAR | Status: DC
  Administered 2022-06-19 (×2): 12 [IU] via SUBCUTANEOUS

## 2022-06-19 MED ORDER — DARBEPOETIN ALFA 100 MCG/0.5ML IJ SOSY
PREFILLED_SYRINGE | INTRAMUSCULAR | Status: AC
  Administered 2022-06-19: 100 ug via SUBCUTANEOUS
  Filled 2022-06-19: qty 0.5

## 2022-06-19 MED ORDER — ACETAMINOPHEN 650 MG RE SUPP: 650.0000 mg | Freq: Four times a day (QID) | RECTAL | Status: DC

## 2022-06-19 MED ORDER — DARBEPOETIN ALFA 100 MCG/0.5ML IJ SOSY: 100.0000 ug | PREFILLED_SYRINGE | Freq: Once | INTRAMUSCULAR | Status: AC

## 2022-06-19 MED ORDER — ACETAMINOPHEN 325 MG PO TABS
650.0000 mg | ORAL_TABLET | Freq: Four times a day (QID) | ORAL | Status: DC
  Administered 2022-06-19 – 2022-06-21 (×9): 650 mg via ORAL
  Filled 2022-06-19 (×9): qty 2

## 2022-06-19 MED ORDER — DEXTROMETHORPHAN POLISTIREX ER 30 MG/5ML PO SUER
30.0000 mg | Freq: Two times a day (BID) | ORAL | Status: DC | PRN
  Administered 2022-06-19: 30 mg via ORAL
  Filled 2022-06-19 (×2): qty 5

## 2022-06-19 MED ORDER — INSULIN GLARGINE-YFGN 100 UNIT/ML ~~LOC~~ SOLN
35.0000 [IU] | Freq: Every day | SUBCUTANEOUS | Status: DC
  Administered 2022-06-19 – 2022-06-20 (×2): 35 [IU] via SUBCUTANEOUS
  Filled 2022-06-19 (×3): qty 0.35

## 2022-06-19 NOTE — Progress Notes (Signed)
4 Days Post-Op  Subjective: Minimal soreness in perineum.  Objective: Vital signs in last 24 hours: Temp:  [97.8 F (36.6 C)-97.9 F (36.6 C)] 97.9 F (36.6 C) (12/08 0753) Pulse Rate:  [93-99] 98 (12/08 0500) Resp:  [16-24] 22 (12/08 0500) BP: (94-151)/(36-77) 126/37 (12/08 0500) SpO2:  [96 %-99 %] 98 % (12/08 0753) Weight:  [113.4 kg] 113.4 kg (12/08 0500) Last BM Date : 06/13/22  Intake/Output from previous day: 12/07 0701 - 12/08 0700 In: 1491.3 [I.V.:1391.3; IV Piggyback:100] Out: -  Intake/Output this shift: No intake/output data recorded.  General appearance: alert, cooperative, and no distress  Lab Results:  Recent Labs    06/18/22 0423 06/19/22 0410  WBC 17.5* 17.5*  HGB 9.8* 9.4*  HCT 31.0* 29.9*  PLT 338 318   BMET Recent Labs    06/18/22 0423 06/19/22 0410  NA 133* 132*  K 3.8 3.9  CL 94* 94*  CO2 26 26  GLUCOSE 242* 209*  BUN 52* 61*  CREATININE 7.48* 9.05*  CALCIUM 8.1* 8.1*   PT/INR No results for input(s): "LABPROT", "INR" in the last 72 hours.  Studies/Results: No results found.  Anti-infectives: Anti-infectives (From admission, onward)    Start     Dose/Rate Route Frequency Ordered Stop   06/15/22 1200  vancomycin (VANCOCIN) IVPB 1000 mg/200 mL premix        1,000 mg 200 mL/hr over 60 Minutes Intravenous Every M-W-F (Hemodialysis) 06/15/22 1048     06/13/22 1400  piperacillin-tazobactam (ZOSYN) 2.25 g in sodium chloride 0.9 % 50 mL IVPB        2.25 g 100 mL/hr over 30 Minutes Intravenous Every 8 hours 06/13/22 1038     06/13/22 0600  piperacillin-tazobactam (ZOSYN) IVPB 2.25 g  Status:  Discontinued        2.25 g 100 mL/hr over 30 Minutes Intravenous Every 8 hours 06/12/22 2038 06/13/22 1037   06/12/22 2045  vancomycin (VANCOREADY) IVPB 1500 mg/300 mL        1,500 mg 150 mL/hr over 120 Minutes Intravenous  Once 06/12/22 2038 06/13/22 0043   06/12/22 2045  piperacillin-tazobactam (ZOSYN) IVPB 3.375 g        3.375 g 100 mL/hr  over 30 Minutes Intravenous  Once 06/12/22 2038 06/12/22 2154       Assessment/Plan: s/p Procedure(s): IRRIGATION AND DEBRIDEMENT PERIRECTAL ABSCESS Impression: Postoperative day 4, status post I&D of perirectal abscess.  Culture results revealed E. coli.  Patient's blood count has come down to 17.5.  He is still requiring Levophed.  Should he continue to require Levophed over the next 24 hours, will have a central line placed in the IJ and remove the femoral line.  Discussed with Dr. Wynetta Emery.  Continue wound care.  LOS: 7 days    Aviva Signs 06/19/2022

## 2022-06-19 NOTE — Progress Notes (Signed)
[facility] Progress Note   Assessment/ Plan:     1. Severe sepsis/ perirectal abscess: s/p I and D 12/4 with gen surg.  Vanc/ Zosyn.  Still on Norepinephrine- has come down with midodrine 10 mg TID.  Hopefully can continue to wean.    2. ESRD:  MWF, using AVF.  HD on schedule 06/17/22, next HD today 06/19/22.  3. 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 Nutrition: renal diet with fluid restriction  6. S/p cardiac arrest- brief, in OR, ROSC achieved, Vtach  7.  Afib: on amio and OP AC    Subjective:   Levophed dose coming down to 67mg/ min.  Not feeling well today- sore.      Objective:   BP (!) 96/46   Pulse 98   Temp 97.9 F (36.6 C)   Resp (!) 21   Ht '5\' 10"'$  (1.778 m)   Wt 113.4 kg   SpO2 98%   BMI 35.87 kg/m   Physical Exam: Gen:NAD, lying in bed CVS: irregular, soft systolic murmur Resp: clear anteriorly Abd: soft, nontender, NABS Ext: 1+ LE edema ACCESS: L AVF + T/B  Labs: BMET Recent Labs  Lab 06/13/22 0451 06/14/22 0313 06/15/22 0356 06/15/22 1243 06/16/22 0355 06/17/22 0401 06/18/22 0423 06/19/22 0410  NA 138 134* 136 134* 133* 135 133* 132*  K 3.6 3.8 4.4 3.7 4.7 4.7 3.8 3.9  CL 96* 93* 94* 93* 94* 93* 94* 94*  CO2 '30 27 26  '$ --  '24 25 26 26  '$ GLUCOSE 164* 289* 196* 177* 275* 306* 242* 209*  BUN 29* 42* 55* 26* 43* 68* 52* 61*  CREATININE 6.37* 8.39* 10.39* 6.40* 7.56* 9.37* 7.48* 9.05*  CALCIUM 8.3* 7.9* 8.3*  --  8.8* 8.3* 8.1* 8.1*  PHOS 4.6 3.8 5.1*  --   --  6.4* 4.5 4.8*   CBC Recent Labs  Lab 06/12/22 1832 06/13/22 0451 06/16/22 0355 06/17/22 0401 06/18/22 0423 06/19/22 0410  WBC 17.2*   < > 22.1* 21.8* 17.5* 17.5*  NEUTROABS 14.7*  --   --   --  13.7* 13.3*  HGB 10.8*   < > 9.5* 9.5* 9.8* 9.4*  HCT 34.4*   < > 29.4* 29.5* 31.0* 29.9*  MCV 100.6*   < > 97.0 96.4 96.0 96.8  PLT 209   < > 284 290 338 318   < > = values in this interval not displayed.      Medications:      amiodarone  200 mg Oral BID   Chlorhexidine Gluconate Cloth  6 each Topical Daily   hydrogen peroxide 3 % 10 Application in sodium chloride irrigation 0.9 % 500 mL irrigation   Irrigation Daily   insulin aspart  0-6 Units Subcutaneous TID AC & HS   insulin aspart  12 Units Subcutaneous TID WC   insulin glargine-yfgn  35 Units Subcutaneous Daily   midodrine  10 mg Oral TID WC   pantoprazole  40 mg Oral QAC breakfast   polyethylene glycol  17 g Oral Daily   sodium chloride flush  10-40 mL Intracatheter Q12H   sucroferric oxyhydroxide  500 mg Oral BID WC     EMadelon Lips MD 06/19/2022, 9:59 AM

## 2022-06-19 NOTE — Progress Notes (Addendum)
PROGRESS NOTE  Justin [name] FTD:[id number] DOB: [birth year] DOA: 06/12/2022 PCP: Michell Heinrich, DO   Brief History:  76 year old male with a history of ESRD (MWF) atrial fibrillation/atrial flutter, hypertension, hyperlipidemia, prediabetes, tobacco abuse in remission presenting with 1 month history of buttock pain, right greater than left.  Patient stated that it started as a boil that subsequently burst.  After testing, he began noticing worsening pain in his perianal region with sitting.  It has progressively gotten worse.  He denies any fevers, chills, chest pain, shortness of breath, coughing, hemoptysis, nausea, vomiting, direct abdominal pain.  He has not been on any recent antibiotics.  Because of his worsening pain, the patient presented for further evaluation and treatment.  He last had dialysis on 06/12/2022.  In the ED, the patient had a low-grade temperature 100.2 F.  He was initially hypotensive with a blood pressure of 86/43.  WBC 17.2, hemoglobin 10.8, platelets 199,000.  Sodium 140, potassium 3.5, bicarbonate 32, serum creatinine 5.32.  CT of the abdomen and pelvis showed a multiloculated/fluid collection and gas collection in the perianal region from the perineum to the inferior buttocks bilaterally.  The patient was started on vancomycin and Zosyn.  General surgery was consulted to assist with management.  The patient's apixaban was held in preparation for surgery.   Assessment/Plan: Severe sepsis -Secondary to perirectal abscess -Presented with tachycardia, leukocytosis, and lactate 2.7 -06/14/22 arterial line inserted -06/14/22  CVL inserted by Dr. Arnoldo Morale -levophed remains, trying to wean down  -Continue vancomycin and Zosyn - he remains on IV pressor support, wean as able  - nephrology team started midodrine 10 mg TID to assist with weaning norepinephrine - if we can't get weaned off norepinephrine by tomorrow plan is to pull femoral line and have IJ  placed  Perirectal abscess -General surgery consulted -restart apixaban when ok with surgery -Echocardiogram EF 60-65%, low normal RVF, mod TR -I&D on 06/15/2022>>GNR and GPC clusters (ID pending) -surgery starting hydrogen peroxide irrigation treatments   Wide Complex tachycardia -?Vtach -Wide complex tachcyardia with thready pulse, received 15 seconds of CPR. Another short 5 second episode while in OR -appreciate cardiology--discussed with Dr. Harl Bowie -on amiodarone 200 mg BID x 3 weeks followed by 200 mg daily per cardiology recs   Acute respiratory Failure with hypoxia -left intubated post-op -personally reviewed CXR 12/4--no edema or infiltrates -Dr. Melvyn Novas consulted -06/16/22--extubated -now on 3L [place] with 100% sat  Pleuritic Chest Pain - from frequent deep coughing - pain is reproducible with light palpation the chest wall - schedule acetaminophen today for anti-inflammatory treatment - cough syrup as needed.    Hyperglycemia  - BS remain elevated in setting of sepsis, critical illness - increased basal coverage to 35 units of semglee - increased prandial coverage to 12 units TID with meals plus SSI coverage CBG (last 3)  Recent Labs    06/18/22 2044 06/19/22 0330 06/19/22 0732  GLUCAP 214* 214* 182*   ESRD -He dialyzes on Monday Wednesday Friday -Nephrology consulted for next dialysis -Continue Velphoro and Sensipar -HD managed by nephrology service -next HD 12/8   Atrial flutter -Holding apixaban in preparation for surgery -treated with amiodarone gtt and on 12/7 transition to oral amiodarone -cardiology recommending amiodarone 200 mg BID x 3 weeks followed by 200 mg daily   Mixed hyperlipidemia -Continue statin   Essential hypertension -Holding metoprolol secondary to hypotension   Obesity -BMI 33.58 -lifestyle modification  Family Communication: no family at bedside 12/7, discussed plan of care with patient who verbalized understanding    Consultants:  renal, general surgery, cardiology, PCCM   Code Status:  FULL    DVT Prophylaxis:  apixaban on hold     Procedures: As Listed in Progress Note Above   Antibiotics: Vanc 12/1>> Zosyn 12/1>>  Subjective: Pt c/o left sided chest wall pain worse with coughing and not being able to get up out of bed.     Objective: Vitals:   06/19/22 0700 06/19/22 0753 06/19/22 0800 06/19/22 0900  BP: (!) 85/34   (!) 96/46  Pulse: 97  97 98  Resp: (!) 21  19 (!) 21  Temp:  97.9 F (36.6 C)    TempSrc:      SpO2: 97% 98% 97% 98%  Weight:      Height:        Intake/Output Summary (Last 24 hours) at 06/19/2022 1059 Last data filed at 06/19/2022 1004 Gross per 24 hour  Intake 1471.25 ml  Output --  Net 1471.25 ml   Weight change: 3.1 kg Exam:  General:  Pt is awake, alert, follows commands appropriately, not in acute distress HEENT: No icterus, No thrush, No neck mass, /AT Cardiovascular: normal S1/S2, no rubs, no gallops: reproducible anterior left side chest pain with palpation.  No signs of rash or blisters seen.  Respiratory: rare crackles. No wheeze Abdomen: Soft/+BS, non tender, non distended, no guarding Extremities: No edema, No lymphangitis, No petechiae, No rashes, no synovitis  Data Reviewed: I have personally reviewed following labs and imaging studies Basic Metabolic Panel: Recent Labs  Lab 06/13/22 0451 06/14/22 0313 06/15/22 0356 06/15/22 1243 06/16/22 0355 06/17/22 0401 06/18/22 0423 06/19/22 0410  NA 138 134* 136 134* 133* 135 133* 132*  K 3.6 3.8 4.4 3.7 4.7 4.7 3.8 3.9  CL 96* 93* 94* 93* 94* 93* 94* 94*  CO2 '30 27 26  '$ --  '24 25 26 26  '$ GLUCOSE 164* 289* 196* 177* 275* 306* 242* 209*  BUN 29* 42* 55* 26* 43* 68* 52* 61*  CREATININE 6.37* 8.39* 10.39* 6.40* 7.56* 9.37* 7.48* 9.05*  CALCIUM 8.3* 7.9* 8.3*  --  8.8* 8.3* 8.1* 8.1*  MG 2.0  --   --   --  1.9  --  1.9  --   PHOS 4.6 3.8 5.1*  --   --  6.4* 4.5 4.8*   Liver Function  Tests: Recent Labs  Lab 06/13/22 0451 06/14/22 0313 06/15/22 0356 06/17/22 0401 06/18/22 0423 06/19/22 0410  AST 20  --   --   --   --   --   ALT 14  --   --   --   --   --   ALKPHOS 65  --   --   --   --   --   BILITOT 0.8  --   --   --   --   --   PROT 7.0  --   --   --   --   --   ALBUMIN 2.6* 2.5* 2.6* 2.2* 2.3* 2.2*   No results for input(s): "LIPASE", "AMYLASE" in the last 168 hours. No results for input(s): "AMMONIA" in the last 168 hours. Coagulation Profile: No results for input(s): "INR", "PROTIME" in the last 168 hours. CBC: Recent Labs  Lab 06/12/22 1832 06/13/22 0451 06/15/22 0356 06/15/22 1243 06/16/22 0355 06/17/22 0401 06/18/22 0423 06/19/22 0410  WBC 17.2*   < > 25.3*  --  22.1* 21.8* 17.5* 17.5*  NEUTROABS 14.7*  --   --   --   --   --  13.7* 13.3*  HGB 10.8*   < > 9.5* 11.2* 9.5* 9.5* 9.8* 9.4*  HCT 34.4*   < > 30.2* 33.0* 29.4* 29.5* 31.0* 29.9*  MCV 100.6*   < > 98.7  --  97.0 96.4 96.0 96.8  PLT 209   < > 237  --  284 290 338 318   < > = values in this interval not displayed.   Cardiac Enzymes: No results for input(s): "CKTOTAL", "CKMB", "CKMBINDEX", "TROPONINI" in the last 168 hours. BNP: Invalid input(s): "POCBNP" CBG: Recent Labs  Lab 06/18/22 1132 06/18/22 1615 06/18/22 2044 06/19/22 0330 06/19/22 0732  GLUCAP 202* 244* 214* 214* 182*   HbA1C: No results for input(s): "HGBA1C" in the last 72 hours.  Urine analysis:    Component Value Date/Time   COLORURINE AMBER (A) 05/27/2020 1451   APPEARANCEUR CLOUDY (A) 05/27/2020 1451   LABSPEC 1.017 05/27/2020 1451   PHURINE 6.0 05/27/2020 1451   GLUCOSEU 50 (A) 05/27/2020 1451   HGBUR LARGE (A) 05/27/2020 1451   BILIRUBINUR NEGATIVE 05/27/2020 1451   KETONESUR NEGATIVE 05/27/2020 1451   PROTEINUR 100 (A) 05/27/2020 1451   UROBILINOGEN 0.2 08/02/2010 1534   NITRITE NEGATIVE 05/27/2020 1451   LEUKOCYTESUR SMALL (A) 05/27/2020 1451   Recent Results (from the past 240 hour(s))  MRSA  Next Gen by PCR, Nasal     Status: None   Collection Time: 06/13/22  1:30 AM   Specimen: Nasal Mucosa; Nasal Swab  Result Value Ref Range Status   MRSA by PCR Next Gen NOT DETECTED NOT DETECTED Final    Comment: (NOTE) The GeneXpert MRSA Assay (FDA approved for NASAL specimens only), is one component of a comprehensive MRSA colonization surveillance program. It is not intended to diagnose MRSA infection nor to guide or monitor treatment for MRSA infections. Test performance is not FDA approved in patients less than 24 years old. Performed at [facility], [address], [place], [place] [address]   Aerobic/Anaerobic Culture w Gram Stain (surgical/deep wound)     Status: None (Preliminary result)   Collection Time: 06/15/22  1:44 PM   Specimen: PATH Other; Tissue  Result Value Ref Range Status   Specimen Description ABSCESS  Final   Special Requests PERIRECTAL  Final   Gram Stain   Final    FEW WBC PRESENT, PREDOMINANTLY PMN RARE GRAM POSITIVE COCCI IN CLUSTERS RARE GRAM POSITIVE RODS Performed at [facility], [place] [address], [place],  [address]    Culture   Final    FEW ESCHERICHIA COLI NO ANAEROBES ISOLATED; CULTURE IN PROGRESS FOR 5 DAYS    Report Status PENDING  Incomplete   Organism ID, Bacteria ESCHERICHIA COLI  Final      Susceptibility   Escherichia coli - MIC*    AMPICILLIN >=32 RESISTANT Resistant     CEFAZOLIN >=64 RESISTANT Resistant     CEFEPIME <=0.12 SENSITIVE Sensitive     CEFTAZIDIME 4 SENSITIVE Sensitive     CEFTRIAXONE <=0.25 SENSITIVE Sensitive     CIPROFLOXACIN >=4 RESISTANT Resistant     GENTAMICIN <=1 SENSITIVE Sensitive     IMIPENEM <=0.25 SENSITIVE Sensitive     TRIMETH/SULFA >=320 RESISTANT Resistant     AMPICILLIN/SULBACTAM >=32 RESISTANT Resistant     PIP/TAZO <=4 SENSITIVE Sensitive     * FEW ESCHERICHIA COLI     Scheduled Meds:  acetaminophen  650 mg Oral Q6H   Or   acetaminophen  650 mg Rectal Q6H   amiodarone  200 mg Oral  BID   Chlorhexidine Gluconate Cloth  6 each Topical Daily   darbepoetin (ARANESP) injection - DIALYSIS  100 mcg Subcutaneous Once   hydrogen peroxide 3 % 10 Application in sodium chloride irrigation 0.9 % 500 mL irrigation   Irrigation Daily   insulin aspart  0-6 Units Subcutaneous TID AC & HS   insulin aspart  12 Units Subcutaneous TID WC   insulin glargine-yfgn  35 Units Subcutaneous Daily   midodrine  10 mg Oral TID WC   pantoprazole  40 mg Oral QAC breakfast   polyethylene glycol  17 g Oral Daily   sodium chloride flush  10-40 mL Intracatheter Q12H   sucroferric oxyhydroxide  500 mg Oral BID WC   Continuous Infusions:  sodium chloride     norepinephrine (LEVOPHED) Adult infusion 12 mcg/min (06/19/22 0509)   piperacillin-tazobactam (ZOSYN) 2.25 g in sodium chloride 0.9 % 50 mL IVPB 2.25 g (06/19/22 0551)    Procedures/Studies: DG Chest Port 1 View  Result Date: 06/16/2022 CLINICAL DATA:  Acute respiratory failure with hypoxia and hypercapnia. Patient on vent. EXAM: PORTABLE CHEST 1 VIEW COMPARISON:  AP chest 06/15/2022 (multiple studies), chest two views 01/28/2020 FINDINGS: Endotracheal tube tip terminates approximately 6.3 cm above the carina, at the inferior aspect of the clavicular heads and similar to prior. Enteric tube descends below the diaphragm with the tip and side port overlying the left upper abdominal quadrant. Moderately decreased lung volumes. Right mid and lower lung horizontal linear densities, likely subsegmental atelectasis. No focal left lung airspace opacity. No definite pleural effusion. No pneumothorax. No acute skeletal abnormality. IMPRESSION: 1. Endotracheal tube tip terminates approximately 6.3 cm above the carina and similar to prior. 2. Enteric tube descends below the diaphragm with the tip and side port overlying the left upper abdominal quadrant. 3. Right mid and lower lung linear opacities are similar to prior, probable atelectasis. It is difficult to exclude  mild pneumonia. Electronically Signed   By: Yvonne Kendall M.D.   On: 06/16/2022 09:02   DG CHEST PORT 1 VIEW  Result Date: 06/15/2022 CLINICAL DATA:  OG tube placement EXAM: PORTABLE CHEST 1 VIEW COMPARISON:  06/15/2022, 2:47 p.m. FINDINGS: Interval placement of esophagogastric tube, tip and side port below the diaphragm. Otherwise unchanged rotated AP portable examination with endotracheal tube over the midtrachea, as well as bilateral perihilar heterogeneous and consolidative airspace opacities. No new airspace opacity. IMPRESSION: 1. Interval placement of esophagogastric tube, tip and side port below the diaphragm. 2. Otherwise unchanged rotated AP portable examination with endotracheal tube over the midtrachea, as well as bilateral perihilar heterogeneous and consolidative airspace opacities. Electronically Signed   By: Delanna Ahmadi M.D.   On: 06/15/2022 18:15   DG Chest Port 1 View  Result Date: 06/15/2022 CLINICAL DATA:  ETT placement EXAM: PORTABLE CHEST 1 VIEW COMPARISON:  Chest x-ray dated July 18th 2021 FINDINGS: ETT tip is proximally 4.5 cm from the carina. Cardiac and mediastinal contours within normal limits. Left perihilar opacities and right mid lung linear opacities, likely due to atelectasis. No pleural effusion or pneumothorax. IMPRESSION: Left perihilar opacities and right mid lung linear opacities, likely due to atelectasis. Electronically Signed   By: Yetta Glassman M.D.   On: 06/15/2022 15:01   ECHOCARDIOGRAM COMPLETE  Result Date: 06/13/2022    ECHOCARDIOGRAM REPORT   Patient Name:   GLYN GERADS Date of  Exam: 06/13/2022 Medical Rec #:  [id number]      Height:       70.0 in Accession #:    [id number]     Weight:       234.0 lb Date of Birth:  09/20/1945      BSA:          2.231 m Patient Age:    69 years       BP:           98/51 mmHg Patient Gender: M              HR:           109 bpm. Exam Location:  Inpatient Procedure: 2D Echo, Color Doppler, Cardiac Doppler and  Intracardiac            Opacification Agent Indications:    Atrial Fibrillation I48.91  History:        Patient has prior history of Echocardiogram examinations, most                 recent 01/30/2020. Aortic Valve Disease, Arrythmias:Atrial                 Fibrillation; Risk Factors:Hypertension and Dyslipidemia. End                 stage renal disease. Severe sepsis.  Sonographer:    Darlina Sicilian RDCS Referring Phys: [phone] [facility]  Sonographer Comments: Suboptimal apical window, suboptimal subcostal window, suboptimal parasternal window and Technically difficult study due to poor echo windows. IMPRESSIONS  1. Left ventricular ejection fraction, by estimation, is 60 to 65%. The left ventricle has normal function. Left ventricular diastolic parameters are indeterminate.  2. Right ventricular systolic function is low normal. The right ventricular size is mildly enlarged. There is moderately elevated pulmonary artery systolic pressure.  3. The mitral valve is grossly normal. No evidence of mitral valve regurgitation. No evidence of mitral stenosis.  4. Tricuspid valve regurgitation is moderate.  5. Poor acoustic windows limit study Assessment of AV also limited by atrial fibrillation     AV is thickened, calcified Peak and mean gradients through the valve are 39 and 21 mm Hg Dimensionless index is 0.33     Overall ocnsistent with moderate AS. COmpared to echo report from 2021, mean gradient is increaed. The aortic valve is calcified. There is mild calcification of the aortic valve. Aortic valve regurgitation is not visualized. Moderate aortic valve stenosis. FINDINGS  Left Ventricle: Left ventricular ejection fraction, by estimation, is 60 to 65%. The left ventricle has normal function. Definity contrast agent was given IV to delineate the left ventricular endocardial borders. The left ventricular internal cavity size was normal in size. There is no left ventricular hypertrophy. Left ventricular diastolic  parameters are indeterminate. Right Ventricle: The right ventricular size is mildly enlarged. No increase in right ventricular wall thickness. Right ventricular systolic function is low normal. There is moderately elevated pulmonary artery systolic pressure. The tricuspid regurgitant  velocity is 3.30 m/s, and with an assumed right atrial pressure of 8 mmHg, the estimated right ventricular systolic pressure is 17.5 mmHg. Left Atrium: Left atrial size was normal in size. Right Atrium: Right atrial size was normal in size. Pericardium: There is no evidence of pericardial effusion. Mitral Valve: The mitral valve is grossly normal. No evidence of mitral valve regurgitation. No evidence of mitral valve stenosis. Tricuspid Valve: The tricuspid valve is normal in structure. Tricuspid valve regurgitation is moderate . No  evidence of tricuspid stenosis. Aortic Valve: Poor acoustic windows limit study Assessment of AV also limited by atrial fibrillation AV is thickened, calcified Peak and mean gradients through the valve are 39 and 21 mm Hg Dimensionless index is 0.33 Overall ocnsistent with moderate AS. COmpared to echo report from 2021, mean gradient is increaed. The aortic valve is calcified. There is mild calcification of the aortic valve. Aortic valve regurgitation is not visualized. Moderate aortic stenosis is present. Aortic valve mean gradient measures 28.0 mmHg. Aortic valve peak gradient measures 44.6 mmHg. Aortic valve area, by VTI measures 1.03 cm. Pulmonic Valve: The pulmonic valve was grossly normal. Pulmonic valve regurgitation is not visualized. No evidence of pulmonic stenosis. Aorta: The aortic root and ascending aorta are structurally normal, with no evidence of dilitation. IAS/Shunts: No atrial level shunt detected by color flow Doppler.  LEFT VENTRICLE PLAX 2D LVIDd:         4.40 cm   Diastology LVIDs:         2.50 cm   LV e' medial:    10.55 cm/s LV PW:         0.90 cm   LV E/e' medial:  12.9 LV IVS:         0.90 cm   LV e' lateral:   13.05 cm/s LVOT diam:     2.00 cm   LV E/e' lateral: 10.5 LV SV:         65 LV SV Index:   29 LVOT Area:     3.14 cm  RIGHT VENTRICLE RV S prime:     13.20 cm/s TAPSE (M-mode): 2.2 cm LEFT ATRIUM           Index LA diam:      4.10 cm 1.84 cm/m LA Vol (A4C): 66.4 ml 29.76 ml/m  AORTIC VALVE AV Area (Vmax):    0.98 cm AV Area (Vmean):   1.03 cm AV Area (VTI):     1.03 cm AV Vmax:           334.00 cm/s AV Vmean:          254.000 cm/s AV VTI:            0.634 m AV Peak Grad:      44.6 mmHg AV Mean Grad:      28.0 mmHg LVOT Vmax:         104.00 cm/s LVOT Vmean:        83.500 cm/s LVOT VTI:          0.207 m LVOT/AV VTI ratio: 0.33  AORTA Ao Root diam: 3.40 cm Ao Asc diam:  3.70 cm MITRAL VALVE                TRICUSPID VALVE MV Area (PHT): 6.83 cm     TR Peak grad:   43.6 mmHg MV Decel Time: 111 msec     TR Vmax:        330.00 cm/s MV E velocity: 136.50 cm/s                             SHUNTS                             Systemic VTI:  0.21 m  Systemic Diam: 2.00 cm Dorris Carnes MD Electronically signed by Dorris Carnes MD Signature Date/Time: 06/13/2022/2:38:06 PM    Final    CT PELVIS W CONTRAST  Result Date: 06/12/2022 CLINICAL DATA:  Left buttock abscess for 1 month EXAM: CT PELVIS WITH CONTRAST TECHNIQUE: Multidetector CT imaging of the pelvis was performed using the standard protocol following the bolus administration of intravenous contrast. RADIATION DOSE REDUCTION: This exam was performed according to the departmental dose-optimization program which includes automated exposure control, adjustment of the mA and/or kV according to patient size and/or use of iterative reconstruction technique. CONTRAST:  182m OMNIPAQUE IOHEXOL 300 MG/ML  SOLN COMPARISON:  05/05/2022, 05/27/2020 FINDINGS: Urinary Tract: Distal ureters are unremarkable. Bladder is decompressed, limiting its evaluation. Bowel: No bowel obstruction or ileus. Normal appendix right lower  quadrant. No bowel wall thickening or inflammatory change. Vascular/Lymphatic: Atherosclerosis of the aorta and its distal branches. No pathologically enlarged lymph nodes within the pelvis. Reproductive: Prostate is enlarged, measuring 5.5 x 4.3 cm. Fiduciary markers are seen within the prostate. Other: There is no free fluid or free intraperitoneal gas. Fat containing umbilical hernia is noted. No bowel herniation. Musculoskeletal: Multilocular gas and fluid collections are seen within the buttocks, consistent with perianal abscesses. The largest gas and fluid collection within the right gluteal region measures 6.3 x 2.7 cm, reference image 50/2. This extends anteriorly into the perineum. More inferiorly within the buttocks are multilocular fluid collections, measuring up to 2.9 x 2.5 cm on the right and 3.8 x 2.5 cm on the left. I do not see any direct communication with the bowel lumen to suggest fistula. IMPRESSION: 1. Multilocular gas and fluid collection within the perianal region, extending from the perineum into the bilateral inferior buttocks, consistent with abscess. I do not see any communication between the gas and fluid collections and the bowel lumen. 2. Enlarged prostate. 3.  Aortic Atherosclerosis (ICD10-I70.0). Electronically Signed   By: MRanda NgoM.D.   On: 06/12/2022 20:01    CIrwin Brakeman MD  [facility]  If 7PM-7AM, please contact night-coverage [email or web address] Password [email or web address] 06/19/2022, 10:59 AM   LOS: 7 days

## 2022-06-19 NOTE — Procedures (Signed)
   HEMODIALYSIS TREATMENT NOTE:  Treatment delayed due to machine problems on two separate Tablo devices.   Pre-HD 117/77, MAP 89 on Levo @ 9 mcg/min.  Difficult cannulation again, same as on Monday.  Unable to fully advance either needle.  Secured with extra tape.  Suspect central vein stenosis or thrombus (left arm is edematous).  Please consider in-patient fistulogram when hemodynamics stabilize.  Re outpatient HD staff he reports, "Some of them can't even stick it."  Last fistulogram appears to have been in 2019.  3.5 hour heparin-free treatment completed.  Rising venous and trans-membrane pressures warranted interruption of therapy for a dialyzer/cartridge change.  No clots visible in cartridge chamber. Needle adjustment was attempted several times, with minimal success.  Primary nurse Birdie Riddle was kind enough to use ultrasound to check for abnormalities.  One area of striation was identified.  Ultimately, the venous needle was removed, hemostasis was achieved, and the AVF was re-cannulated with 17g needle more distally from anastomosis.  Treatment was resumed (after 1.25 hours) with VP 180-200 at Qb 180-200.  Levophed was titrated to 11 mcg/min to optimize ultrafiltration.  Venous and trans-membrane pressures gradually rose.  NO evidence of infiltration.  With 5 minutes remaining, VP 260 prompted Tablo to call for end of treatment.  All blood was able to be returned.  No evidence of clotting in the cartridge chamber - the issues are within the AVF itself.  Needles were removed and hemostasis was achieved in 20 minutes.  Hand-off given to Birdie Riddle, RN.  Total run time: 3 hours 25 minutes. Net UF:  1.4 liters   Rockwell Alexandria, RN AP K[facility]

## 2022-06-19 NOTE — Care Management Important Message (Signed)
Important Message  Patient Details  Name: Justin [name] MRN: [id number] Date of Birth: [birth year]   Medicare Important Message Given:  Yes     [name] 06/19/2022, 3:11 PM

## 2022-06-20 DIAGNOSIS — I350 Nonrheumatic aortic (valve) stenosis: Secondary | ICD-10-CM | POA: Diagnosis not present

## 2022-06-20 DIAGNOSIS — I4892 Unspecified atrial flutter: Secondary | ICD-10-CM | POA: Diagnosis not present

## 2022-06-20 DIAGNOSIS — R000 Tachycardia, unspecified: Secondary | ICD-10-CM | POA: Diagnosis not present

## 2022-06-20 LAB — CBC WITH DIFFERENTIAL/PLATELET
Abs Immature Granulocytes: 0.35 10*3/uL — ABNORMAL HIGH (ref 0.00–0.07)
Basophils Absolute: 0.1 10*3/uL (ref 0.0–0.1)
Basophils Relative: 1 %
Eosinophils Absolute: 0.2 10*3/uL (ref 0.0–0.5)
Eosinophils Relative: 1 %
HCT: 28.3 % — ABNORMAL LOW (ref 39.0–52.0)
Hemoglobin: 8.9 g/dL — ABNORMAL LOW (ref 13.0–17.0)
Immature Granulocytes: 2 %
Lymphocytes Relative: 10 %
Lymphs Abs: 1.7 10*3/uL (ref 0.7–4.0)
MCH: 30.4 pg (ref 26.0–34.0)
MCHC: 31.4 g/dL (ref 30.0–36.0)
MCV: 96.6 fL (ref 80.0–100.0)
Monocytes Absolute: 1.4 10*3/uL — ABNORMAL HIGH (ref 0.1–1.0)
Monocytes Relative: 8 %
Neutro Abs: 13 10*3/uL — ABNORMAL HIGH (ref 1.7–7.7)
Neutrophils Relative %: 78 %
Platelets: 351 10*3/uL (ref 150–400)
RBC: 2.93 MIL/uL — ABNORMAL LOW (ref 4.22–5.81)
RDW: 14.6 % (ref 11.5–15.5)
WBC: 16.7 10*3/uL — ABNORMAL HIGH (ref 4.0–10.5)
nRBC: 0 % (ref 0.0–0.2)

## 2022-06-20 LAB — AEROBIC/ANAEROBIC CULTURE W GRAM STAIN (SURGICAL/DEEP WOUND)

## 2022-06-20 LAB — RENAL FUNCTION PANEL
Albumin: 2.2 g/dL — ABNORMAL LOW (ref 3.5–5.0)
Anion gap: 12 (ref 5–15)
BUN: 39 mg/dL — ABNORMAL HIGH (ref 8–23)
CO2: 26 mmol/L (ref 22–32)
Calcium: 8 mg/dL — ABNORMAL LOW (ref 8.9–10.3)
Chloride: 95 mmol/L — ABNORMAL LOW (ref 98–111)
Creatinine, Ser: 6.73 mg/dL — ABNORMAL HIGH (ref 0.61–1.24)
GFR, Estimated: 8 mL/min — ABNORMAL LOW (ref >60)
Glucose, Bld: 87 mg/dL (ref 70–99)
Phosphorus: 4.4 mg/dL (ref 2.5–4.6)
Potassium: 3.7 mmol/L (ref 3.5–5.1)
Sodium: 133 mmol/L — ABNORMAL LOW (ref 135–145)

## 2022-06-20 LAB — GLUCOSE, CAPILLARY
Glucose-Capillary: 92 mg/dL (ref 70–99)
Glucose-Capillary: 86 mg/dL (ref 70–99)
Glucose-Capillary: 107 mg/dL — ABNORMAL HIGH (ref 70–99)
Glucose-Capillary: 66 mg/dL — ABNORMAL LOW (ref 70–99)
Glucose-Capillary: 76 mg/dL (ref 70–99)
Glucose-Capillary: 144 mg/dL — ABNORMAL HIGH (ref 70–99)

## 2022-06-20 MED ORDER — MIDODRINE HCL 5 MG PO TABS: 10.0000 mg | ORAL_TABLET | Freq: Three times a day (TID) | ORAL | Status: DC

## 2022-06-20 MED ORDER — DEXTROSE 50 % IV SOLN
1.0000 | Freq: Once | INTRAVENOUS | Status: AC
  Administered 2022-06-20: 50 mL via INTRAVENOUS

## 2022-06-20 MED ORDER — INSULIN ASPART 100 UNIT/ML IJ SOLN: 4.0000 [IU] | Freq: Three times a day (TID) | INTRAMUSCULAR | Status: DC

## 2022-06-20 MED ORDER — MIDODRINE HCL 5 MG PO TABS: 15.0000 mg | ORAL_TABLET | Freq: Three times a day (TID) | ORAL | Status: DC

## 2022-06-20 MED ORDER — DEXTROSE 50 % IV SOLN
INTRAVENOUS | Status: AC
  Filled 2022-06-20: qty 50

## 2022-06-20 MED ORDER — MIDODRINE HCL 5 MG PO TABS
15.0000 mg | ORAL_TABLET | Freq: Three times a day (TID) | ORAL | Status: DC
  Administered 2022-06-20: 15 mg via ORAL
  Filled 2022-06-20 (×2): qty 3

## 2022-06-20 MED ORDER — INSULIN GLARGINE-YFGN 100 UNIT/ML ~~LOC~~ SOLN
12.0000 [IU] | Freq: Every day | SUBCUTANEOUS | Status: DC
  Filled 2022-06-20: qty 0.12

## 2022-06-20 NOTE — Progress Notes (Signed)
[facility] Progress Note  5 Days Post-Op  Subjective: Patient seen and examined.  He is resting comfortably in bed.  He was able to be weaned off of his Levophed this morning.  He is tolerating a diet without nausea or vomiting.  Denies any abdominal pain.  He has some tenderness at his gluteal incision sites.  Objective: Vital signs in last 24 hours: Temp:  [97.7 F (36.5 C)-98.2 F (36.8 C)] 98.2 F (36.8 C) (12/09 0828) Pulse Rate:  [72-98] 95 (12/09 1200) Resp:  [11-29] 17 (12/09 1200) BP: (53-169)/(24-128) 92/42 (12/09 1200) SpO2:  [92 %-100 %] 99 % (12/09 1200) Weight:  [113.9 kg-117.1 kg] 117.1 kg (12/09 0508) Last BM Date : 06/20/22  Intake/Output from previous day: 12/08 0701 - 12/09 0700 In: 1170.5 [I.V.:970.5; IV Piggyback:200] Out: 1 [Stool:1] Intake/Output this shift: Total I/O In: 210.3 [I.V.:117.2; IV Piggyback:93.1] Out: -   General appearance: alert, cooperative, and no distress GI: soft, non-tender; bowel sounds normal; no masses,  no organomegaly Pelvic: Perineal wounds open with some surrounding induration, minimal drainage noted  Lab Results:  Recent Labs    06/19/22 0410 06/20/22 0553  WBC 17.5* 16.7*  HGB 9.4* 8.9*  HCT 29.9* 28.3*  PLT 318 351   BMET Recent Labs    06/19/22 0410 06/20/22 0553  NA 132* 133*  K 3.9 3.7  CL 94* 95*  CO2 26 26  GLUCOSE 209* 87  BUN 61* 39*  CREATININE 9.05* 6.73*  CALCIUM 8.1* 8.0*   PT/INR No results for input(s): "LABPROT", "INR" in the last 72 hours.  Studies/Results: No results found.  Anti-infectives: Anti-infectives (From admission, onward)    Start     Dose/Rate Route Frequency Ordered Stop   06/15/22 1200  vancomycin (VANCOCIN) IVPB 1000 mg/200 mL premix  Status:  Discontinued        1,000 mg 200 mL/hr over 60 Minutes Intravenous Every M-W-F (Hemodialysis) 06/15/22 1048 06/19/22 1057   06/13/22 1400  piperacillin-tazobactam (ZOSYN) 2.25 g in sodium chloride 0.9 % 50  mL IVPB        2.25 g 100 mL/hr over 30 Minutes Intravenous Every 8 hours 06/13/22 1038     06/13/22 0600  piperacillin-tazobactam (ZOSYN) IVPB 2.25 g  Status:  Discontinued        2.25 g 100 mL/hr over 30 Minutes Intravenous Every 8 hours 06/12/22 2038 06/13/22 1037   06/12/22 2045  vancomycin (VANCOREADY) IVPB 1500 mg/300 mL        1,500 mg 150 mL/hr over 120 Minutes Intravenous  Once 06/12/22 2038 06/13/22 0043   06/12/22 2045  piperacillin-tazobactam (ZOSYN) IVPB 3.375 g        3.375 g 100 mL/hr over 30 Minutes Intravenous  Once 06/12/22 2038 06/12/22 2154       Assessment/Plan:  Patient is a 76 year old male who is status post irrigation and debridement of perirectal abscess on 12/4.  Blood cultures demonstrating E. coli.  -Continue with twice daily irrigation of perineal wounds with peroxide -Leukocytosis slightly improved today, 16.7 from 17.5 -Able to be weaned off Levophed -Okay for removal of femoral CVC once we are sure patient will not need any more vasopressors.  If patient does require further vasopressors, plan was for IJ central line placement   LOS: 8 days    Khadijatou Borak A Rainbow Salman 06/20/2022

## 2022-06-20 NOTE — Progress Notes (Signed)
PROGRESS NOTE  ALEJANDRA BARNA SEG:[id number] DOB: June 25, 1946 DOA: 06/12/2022 PCP: Michell Heinrich, DO   Brief History:  76 year old male with a history of ESRD (MWF) atrial fibrillation/atrial flutter, hypertension, hyperlipidemia, prediabetes, tobacco abuse in remission presenting with 1 month history of buttock pain, right greater than left.  Patient stated that it started as a boil that subsequently burst.  After testing, he began noticing worsening pain in his perianal region with sitting.  It has progressively gotten worse.  He denies any fevers, chills, chest pain, shortness of breath, coughing, hemoptysis, nausea, vomiting, direct abdominal pain.  He has not been on any recent antibiotics.  Because of his worsening pain, the patient presented for further evaluation and treatment.  He last had dialysis on 06/12/2022.  In the ED, the patient had a low-grade temperature 100.2 F.  He was initially hypotensive with a blood pressure of 86/43.  WBC 17.2, hemoglobin 10.8, platelets 199,000.  Sodium 140, potassium 3.5, bicarbonate 32, serum creatinine 5.32.  CT of the abdomen and pelvis showed a multiloculated/fluid collection and gas collection in the perianal region from the perineum to the inferior buttocks bilaterally.  The patient was started on vancomycin and Zosyn.  General surgery was consulted to assist with management.  The patient's apixaban was held in preparation for surgery.   Assessment/Plan: Severe sepsis -Secondary to perirectal abscess -Presented with tachycardia, leukocytosis, and lactate 2.7 -06/14/22 arterial line inserted -06/14/22  CVL inserted by Dr. Arnoldo Morale, plan to DC 12/9 if no further pressor needed -Continue Zosyn for now - nephrology team started midodrine 10 mg TID to assist with weaning norepinephrine - weaned off norepinephrine 12/9; if holds and no further need for pressors, plan is to pull femoral line today.   Perirectal abscess -General surgery  consulted -restart apixaban when ok with surgery -Echocardiogram EF 60-65%, low normal RVF, mod TR -I&D on 06/15/2022>>GNR and GPC clusters (ID pending) -surgery starting hydrogen peroxide irrigation treatments   Wide Complex tachycardia -?Vtach -Wide complex tachcyardia with thready pulse, received 15 seconds of CPR. Another short 5 second episode while in OR -appreciate cardiology--discussed with Dr. Harl Bowie -on amiodarone 200 mg BID x 3 weeks followed by 200 mg daily per cardiology recs   Acute respiratory Failure with hypoxia -left intubated post-op -personally reviewed CXR 12/4--no edema or infiltrates -Dr. Melvyn Novas consulted -06/16/22--extubated -now on 3L [facility] with 100% sat  Pleuritic Chest Pain - from frequent deep coughing - pain is reproducible with light palpation the chest wall - schedule acetaminophen today for anti-inflammatory treatment - cough syrup as needed.    Hyperglycemia  - BS coming down precipitously as the infection improves - reduced basal coverage to 12 units of semglee - increased prandial coverage to 4 units TID with meals plus SSI coverage CBG (last 3)  Recent Labs    06/20/22 0303 06/20/22 0807 06/20/22 1150  GLUCAP 92 86 107*   ESRD -He dialyzes on Monday Wednesday Friday -Nephrology consulted for next dialysis -Continue Velphoro and Sensipar -HD managed by nephrology service -next HD per nephrology team  Swollen left extremity - discussed with nephrologist Dr. Johnney Ou - they plan to have it evaluated   Atrial flutter -Holding apixaban in case a new central line needed; if not needed; resume 12/10 -treated with amiodarone gtt and on 12/7 transitioned to oral amiodarone -cardiology recommending amiodarone 200 mg BID x 3 weeks followed by 200 mg daily   Mixed hyperlipidemia -Continue statin   Essential  hypertension -Holding metoprolol secondary to hypotension   Obesity -BMI 33.58 -lifestyle modification      Family Communication: no  family at bedside, discussed plan of care with patient who verbalized understanding   Consultants:  renal, general surgery, cardiology, PCCM   Code Status:  FULL    DVT Prophylaxis:  apixaban on hold     Procedures: As Listed in Progress Note Above   Antibiotics: Vanc 12/1>> Zosyn 12/1>>12/6  Subjective: Pt c/o poor food choices, still not happy with reg diet, chest wall pain persists on left side, reports swollen left hand and arm today.     Objective: Vitals:   06/20/22 1100 06/20/22 1130 06/20/22 1200 06/20/22 1300  BP: (!) 141/50 (!) 88/68 (!) 92/42 (!) 92/31  Pulse: 94 98 95 73  Resp: '18 18 17   '$ Temp:      TempSrc:      SpO2: 98% 98% 99% 100%  Weight:      Height:        Intake/Output Summary (Last 24 hours) at 06/20/2022 1327 Last data filed at 06/20/2022 1323 Gross per 24 hour  Intake 1415.08 ml  Output 1 ml  Net 1414.08 ml   Weight change: 0.5 kg Exam:  General:  Pt is awake, alert, follows commands appropriately, not in acute distress HEENT: No icterus, No thrush, No neck mass, [place]/AT Cardiovascular: normal S1/S2, no rubs, no gallops: reproducible anterior left side chest pain with palpation.  No signs of rash or blisters seen.  Respiratory: rare crackles. No wheeze Abdomen: Soft/+BS, non tender, non distended, no guarding Extremities: No edema, No lymphangitis, No petechiae, No rashes, no synovitis  Data Reviewed: I have personally reviewed following labs and imaging studies Basic Metabolic Panel: Recent Labs  Lab 06/15/22 0356 06/15/22 1243 06/16/22 0355 06/17/22 0401 06/18/22 0423 06/19/22 0410 06/20/22 0553  NA 136   < > 133* 135 133* 132* 133*  K 4.4   < > 4.7 4.7 3.8 3.9 3.7  CL 94*   < > 94* 93* 94* 94* 95*  CO2 26  --  '24 25 26 26 26  '$ GLUCOSE 196*   < > 275* 306* 242* 209* 87  BUN 55*   < > 43* 68* 52* 61* 39*  CREATININE 10.39*   < > 7.56* 9.37* 7.48* 9.05* 6.73*  CALCIUM 8.3*  --  8.8* 8.3* 8.1* 8.1* 8.0*  MG  --   --  1.9  --  1.9   --   --   PHOS 5.1*  --   --  6.4* 4.5 4.8* 4.4   < > = values in this interval not displayed.   Liver Function Tests: Recent Labs  Lab 06/15/22 0356 06/17/22 0401 06/18/22 0423 06/19/22 0410 06/20/22 0553  ALBUMIN 2.6* 2.2* 2.3* 2.2* 2.2*   No results for input(s): "LIPASE", "AMYLASE" in the last 168 hours. No results for input(s): "AMMONIA" in the last 168 hours. Coagulation Profile: No results for input(s): "INR", "PROTIME" in the last 168 hours. CBC: Recent Labs  Lab 06/16/22 0355 06/17/22 0401 06/18/22 0423 06/19/22 0410 06/20/22 0553  WBC 22.1* 21.8* 17.5* 17.5* 16.7*  NEUTROABS  --   --  13.7* 13.3* 13.0*  HGB 9.5* 9.5* 9.8* 9.4* 8.9*  HCT 29.4* 29.5* 31.0* 29.9* 28.3*  MCV 97.0 96.4 96.0 96.8 96.6  PLT 284 290 338 318 351   Cardiac Enzymes: No results for input(s): "CKTOTAL", "CKMB", "CKMBINDEX", "TROPONINI" in the last 168 hours. BNP: Invalid input(s): "POCBNP" CBG: Recent Labs  Lab 06/19/22 1613 06/19/22 2206 06/20/22 0303 06/20/22 0807 06/20/22 1150  GLUCAP 73 111* 92 86 107*   HbA1C: No results for input(s): "HGBA1C" in the last 72 hours.  Urine analysis:    Component Value Date/Time   COLORURINE AMBER (A) 05/27/2020 1451   APPEARANCEUR CLOUDY (A) 05/27/2020 1451   LABSPEC 1.017 05/27/2020 1451   PHURINE 6.0 05/27/2020 1451   GLUCOSEU 50 (A) 05/27/2020 1451   HGBUR LARGE (A) 05/27/2020 1451   BILIRUBINUR NEGATIVE 05/27/2020 1451   KETONESUR NEGATIVE 05/27/2020 1451   PROTEINUR 100 (A) 05/27/2020 1451   UROBILINOGEN 0.2 08/02/2010 1534   NITRITE NEGATIVE 05/27/2020 1451   LEUKOCYTESUR SMALL (A) 05/27/2020 1451   Recent Results (from the past 240 hour(s))  MRSA Next Gen by PCR, Nasal     Status: None   Collection Time: 06/13/22  1:30 AM   Specimen: Nasal Mucosa; Nasal Swab  Result Value Ref Range Status   MRSA by PCR Next Gen NOT DETECTED NOT DETECTED Final    Comment: (NOTE) The GeneXpert MRSA Assay (FDA approved for NASAL specimens  only), is one component of a comprehensive MRSA colonization surveillance program. It is not intended to diagnose MRSA infection nor to guide or monitor treatment for MRSA infections. Test performance is not FDA approved in patients less than 106 years old. Performed at [facility], [address], [place], [place] [address]   Aerobic/Anaerobic Culture w Gram Stain (surgical/deep wound)     Status: None (Preliminary result)   Collection Time: 06/15/22  1:44 PM   Specimen: PATH Other; Tissue  Result Value Ref Range Status   Specimen Description ABSCESS  Final   Special Requests PERIRECTAL  Final   Gram Stain   Final    FEW WBC PRESENT, PREDOMINANTLY PMN RARE GRAM POSITIVE COCCI IN CLUSTERS RARE GRAM POSITIVE RODS Performed at [facility], [place] [address] [place], [place] [address]    Culture   Final    FEW ESCHERICHIA COLI NO ANAEROBES ISOLATED; CULTURE IN PROGRESS FOR 5 DAYS    Report Status PENDING  Incomplete   Organism ID, Bacteria ESCHERICHIA COLI  Final      Susceptibility   Escherichia coli - MIC*    AMPICILLIN >=32 RESISTANT Resistant     CEFAZOLIN >=64 RESISTANT Resistant     CEFEPIME <=0.12 SENSITIVE Sensitive     CEFTAZIDIME 4 SENSITIVE Sensitive     CEFTRIAXONE <=0.25 SENSITIVE Sensitive     CIPROFLOXACIN >=4 RESISTANT Resistant     GENTAMICIN <=1 SENSITIVE Sensitive     IMIPENEM <=0.25 SENSITIVE Sensitive     TRIMETH/SULFA >=320 RESISTANT Resistant     AMPICILLIN/SULBACTAM >=32 RESISTANT Resistant     PIP/TAZO <=4 SENSITIVE Sensitive     * FEW ESCHERICHIA COLI    Scheduled Meds:  acetaminophen  650 mg Oral Q6H   Or   acetaminophen  650 mg Rectal Q6H   amiodarone  200 mg Oral BID   Chlorhexidine Gluconate Cloth  6 each Topical Daily   hydrogen peroxide 3 % 10 Application in sodium chloride irrigation 0.9 % 500 mL irrigation   Irrigation Daily   insulin aspart  0-6 Units Subcutaneous TID AC & HS   insulin aspart  4 Units Subcutaneous TID WC   [START ON  06/21/2022] insulin glargine-yfgn  12 Units Subcutaneous Daily   midodrine  10 mg Oral TID WC   pantoprazole  40 mg Oral QAC breakfast   polyethylene glycol  17 g Oral Daily  sodium chloride flush  10-40 mL Intracatheter Q12H   sucroferric oxyhydroxide  500 mg Oral BID WC   Continuous Infusions:  sodium chloride     norepinephrine (LEVOPHED) Adult infusion Stopped (06/20/22 1112)   piperacillin-tazobactam (ZOSYN) 2.25 g in sodium chloride 0.9 % 50 mL IVPB Stopped (06/20/22 0616)   Procedures/Studies: DG Chest Port 1 View  Result Date: 06/16/2022 CLINICAL DATA:  Acute respiratory failure with hypoxia and hypercapnia. Patient on vent. EXAM: PORTABLE CHEST 1 VIEW COMPARISON:  AP chest 06/15/2022 (multiple studies), chest two views 01/28/2020 FINDINGS: Endotracheal tube tip terminates approximately 6.3 cm above the carina, at the inferior aspect of the clavicular heads and similar to prior. Enteric tube descends below the diaphragm with the tip and side port overlying the left upper abdominal quadrant. Moderately decreased lung volumes. Right mid and lower lung horizontal linear densities, likely subsegmental atelectasis. No focal left lung airspace opacity. No definite pleural effusion. No pneumothorax. No acute skeletal abnormality. IMPRESSION: 1. Endotracheal tube tip terminates approximately 6.3 cm above the carina and similar to prior. 2. Enteric tube descends below the diaphragm with the tip and side port overlying the left upper abdominal quadrant. 3. Right mid and lower lung linear opacities are similar to prior, probable atelectasis. It is difficult to exclude mild pneumonia. Electronically Signed   By: Yvonne Kendall M.D.   On: 06/16/2022 09:02   DG CHEST PORT 1 VIEW  Result Date: 06/15/2022 CLINICAL DATA:  OG tube placement EXAM: PORTABLE CHEST 1 VIEW COMPARISON:  06/15/2022, 2:47 p.m. FINDINGS: Interval placement of esophagogastric tube, tip and side port below the diaphragm. Otherwise  unchanged rotated AP portable examination with endotracheal tube over the midtrachea, as well as bilateral perihilar heterogeneous and consolidative airspace opacities. No new airspace opacity. IMPRESSION: 1. Interval placement of esophagogastric tube, tip and side port below the diaphragm. 2. Otherwise unchanged rotated AP portable examination with endotracheal tube over the midtrachea, as well as bilateral perihilar heterogeneous and consolidative airspace opacities. Electronically Signed   By: Delanna Ahmadi M.D.   On: 06/15/2022 18:15   DG Chest Port 1 View  Result Date: 06/15/2022 CLINICAL DATA:  ETT placement EXAM: PORTABLE CHEST 1 VIEW COMPARISON:  Chest x-ray dated July 18th 2021 FINDINGS: ETT tip is proximally 4.5 cm from the carina. Cardiac and mediastinal contours within normal limits. Left perihilar opacities and right mid lung linear opacities, likely due to atelectasis. No pleural effusion or pneumothorax. IMPRESSION: Left perihilar opacities and right mid lung linear opacities, likely due to atelectasis. Electronically Signed   By: Yetta Glassman M.D.   On: 06/15/2022 15:01   ECHOCARDIOGRAM COMPLETE  Result Date: 06/13/2022    ECHOCARDIOGRAM REPORT   Patient Name:   KIEN MIRSKY Date of Exam: 06/13/2022 Medical Rec #:  [id number]      Height:       70.0 in Accession #:    [id number]     Weight:       234.0 lb Date of Birth:  10-Oct-1945      BSA:          2.231 m Patient Age:    89 years       BP:           98/51 mmHg Patient Gender: M              HR:           109 bpm. Exam Location:  Inpatient Procedure: 2D Echo, Color Doppler, Cardiac Doppler and Intracardiac  Opacification Agent Indications:    Atrial Fibrillation I48.91  History:        Patient has prior history of Echocardiogram examinations, most                 recent 01/30/2020. Aortic Valve Disease, Arrythmias:Atrial                 Fibrillation; Risk Factors:Hypertension and Dyslipidemia. End                 stage renal  disease. Severe sepsis.  Sonographer:    Darlina Sicilian RDCS Referring Phys: [phone] [facility]  Sonographer Comments: Suboptimal apical window, suboptimal subcostal window, suboptimal parasternal window and Technically difficult study due to poor echo windows. IMPRESSIONS  1. Left ventricular ejection fraction, by estimation, is 60 to 65%. The left ventricle has normal function. Left ventricular diastolic parameters are indeterminate.  2. Right ventricular systolic function is low normal. The right ventricular size is mildly enlarged. There is moderately elevated pulmonary artery systolic pressure.  3. The mitral valve is grossly normal. No evidence of mitral valve regurgitation. No evidence of mitral stenosis.  4. Tricuspid valve regurgitation is moderate.  5. Poor acoustic windows limit study Assessment of AV also limited by atrial fibrillation     AV is thickened, calcified Peak and mean gradients through the valve are 39 and 21 mm Hg Dimensionless index is 0.33     Overall ocnsistent with moderate AS. COmpared to echo report from 2021, mean gradient is increaed. The aortic valve is calcified. There is mild calcification of the aortic valve. Aortic valve regurgitation is not visualized. Moderate aortic valve stenosis. FINDINGS  Left Ventricle: Left ventricular ejection fraction, by estimation, is 60 to 65%. The left ventricle has normal function. Definity contrast agent was given IV to delineate the left ventricular endocardial borders. The left ventricular internal cavity size was normal in size. There is no left ventricular hypertrophy. Left ventricular diastolic parameters are indeterminate. Right Ventricle: The right ventricular size is mildly enlarged. No increase in right ventricular wall thickness. Right ventricular systolic function is low normal. There is moderately elevated pulmonary artery systolic pressure. The tricuspid regurgitant  velocity is 3.30 m/s, and with an assumed right atrial pressure of 8  mmHg, the estimated right ventricular systolic pressure is 99.8 mmHg. Left Atrium: Left atrial size was normal in size. Right Atrium: Right atrial size was normal in size. Pericardium: There is no evidence of pericardial effusion. Mitral Valve: The mitral valve is grossly normal. No evidence of mitral valve regurgitation. No evidence of mitral valve stenosis. Tricuspid Valve: The tricuspid valve is normal in structure. Tricuspid valve regurgitation is moderate . No evidence of tricuspid stenosis. Aortic Valve: Poor acoustic windows limit study Assessment of AV also limited by atrial fibrillation AV is thickened, calcified Peak and mean gradients through the valve are 39 and 21 mm Hg Dimensionless index is 0.33 Overall ocnsistent with moderate AS. COmpared to echo report from 2021, mean gradient is increaed. The aortic valve is calcified. There is mild calcification of the aortic valve. Aortic valve regurgitation is not visualized. Moderate aortic stenosis is present. Aortic valve mean gradient measures 28.0 mmHg. Aortic valve peak gradient measures 44.6 mmHg. Aortic valve area, by VTI measures 1.03 cm. Pulmonic Valve: The pulmonic valve was grossly normal. Pulmonic valve regurgitation is not visualized. No evidence of pulmonic stenosis. Aorta: The aortic root and ascending aorta are structurally normal, with no evidence of dilitation. IAS/Shunts: No atrial level shunt detected  by color flow Doppler.  LEFT VENTRICLE PLAX 2D LVIDd:         4.40 cm   Diastology LVIDs:         2.50 cm   LV e' medial:    10.55 cm/s LV PW:         0.90 cm   LV E/e' medial:  12.9 LV IVS:        0.90 cm   LV e' lateral:   13.05 cm/s LVOT diam:     2.00 cm   LV E/e' lateral: 10.5 LV SV:         65 LV SV Index:   29 LVOT Area:     3.14 cm  RIGHT VENTRICLE RV S prime:     13.20 cm/s TAPSE (M-mode): 2.2 cm LEFT ATRIUM           Index LA diam:      4.10 cm 1.84 cm/m LA Vol (A4C): 66.4 ml 29.76 ml/m  AORTIC VALVE AV Area (Vmax):    0.98 cm  AV Area (Vmean):   1.03 cm AV Area (VTI):     1.03 cm AV Vmax:           334.00 cm/s AV Vmean:          254.000 cm/s AV VTI:            0.634 m AV Peak Grad:      44.6 mmHg AV Mean Grad:      28.0 mmHg LVOT Vmax:         104.00 cm/s LVOT Vmean:        83.500 cm/s LVOT VTI:          0.207 m LVOT/AV VTI ratio: 0.33  AORTA Ao Root diam: 3.40 cm Ao Asc diam:  3.70 cm MITRAL VALVE                TRICUSPID VALVE MV Area (PHT): 6.83 cm     TR Peak grad:   43.6 mmHg MV Decel Time: 111 msec     TR Vmax:        330.00 cm/s MV E velocity: 136.50 cm/s                             SHUNTS                             Systemic VTI:  0.21 m                             Systemic Diam: 2.00 cm Dorris Carnes MD Electronically signed by Dorris Carnes MD Signature Date/Time: 06/13/2022/2:38:06 PM    Final    CT PELVIS W CONTRAST  Result Date: 06/12/2022 CLINICAL DATA:  Left buttock abscess for 1 month EXAM: CT PELVIS WITH CONTRAST TECHNIQUE: Multidetector CT imaging of the pelvis was performed using the standard protocol following the bolus administration of intravenous contrast. RADIATION DOSE REDUCTION: This exam was performed according to the departmental dose-optimization program which includes automated exposure control, adjustment of the mA and/or kV according to patient size and/or use of iterative reconstruction technique. CONTRAST:  185m OMNIPAQUE IOHEXOL 300 MG/ML  SOLN COMPARISON:  05/05/2022, 05/27/2020 FINDINGS: Urinary Tract: Distal ureters are unremarkable. Bladder is decompressed, limiting its evaluation. Bowel: No bowel obstruction or ileus. Normal appendix right lower quadrant. No  bowel wall thickening or inflammatory change. Vascular/Lymphatic: Atherosclerosis of the aorta and its distal branches. No pathologically enlarged lymph nodes within the pelvis. Reproductive: Prostate is enlarged, measuring 5.5 x 4.3 cm. Fiduciary markers are seen within the prostate. Other: There is no free fluid or free intraperitoneal gas.  Fat containing umbilical hernia is noted. No bowel herniation. Musculoskeletal: Multilocular gas and fluid collections are seen within the buttocks, consistent with perianal abscesses. The largest gas and fluid collection within the right gluteal region measures 6.3 x 2.7 cm, reference image 50/2. This extends anteriorly into the perineum. More inferiorly within the buttocks are multilocular fluid collections, measuring up to 2.9 x 2.5 cm on the right and 3.8 x 2.5 cm on the left. I do not see any direct communication with the bowel lumen to suggest fistula. IMPRESSION: 1. Multilocular gas and fluid collection within the perianal region, extending from the perineum into the bilateral inferior buttocks, consistent with abscess. I do not see any communication between the gas and fluid collections and the bowel lumen. 2. Enlarged prostate. 3.  Aortic Atherosclerosis (ICD10-I70.0). Electronically Signed   By: Randa Ngo M.D.   On: 06/12/2022 20:01    Irwin Brakeman, MD  [facility]  If 7PM-7AM, please contact night-coverage [email or web address] Password [email or web address] 06/20/2022, 1:27 PM   LOS: 8 days

## 2022-06-21 DIAGNOSIS — I350 Nonrheumatic aortic (valve) stenosis: Secondary | ICD-10-CM | POA: Diagnosis not present

## 2022-06-21 DIAGNOSIS — R000 Tachycardia, unspecified: Secondary | ICD-10-CM | POA: Diagnosis not present

## 2022-06-21 DIAGNOSIS — I4892 Unspecified atrial flutter: Secondary | ICD-10-CM | POA: Diagnosis not present

## 2022-06-21 LAB — RENAL FUNCTION PANEL
Albumin: 2.3 g/dL — ABNORMAL LOW (ref 3.5–5.0)
Anion gap: 12 (ref 5–15)
BUN: 49 mg/dL — ABNORMAL HIGH (ref 8–23)
CO2: 26 mmol/L (ref 22–32)
Calcium: 8.6 mg/dL — ABNORMAL LOW (ref 8.9–10.3)
Chloride: 94 mmol/L — ABNORMAL LOW (ref 98–111)
Creatinine, Ser: 8.68 mg/dL — ABNORMAL HIGH (ref 0.61–1.24)
GFR, Estimated: 6 mL/min — ABNORMAL LOW (ref >60)
Glucose, Bld: 65 mg/dL — ABNORMAL LOW (ref 70–99)
Phosphorus: 6 mg/dL — ABNORMAL HIGH (ref 2.5–4.6)
Potassium: 3.9 mmol/L (ref 3.5–5.1)
Sodium: 132 mmol/L — ABNORMAL LOW (ref 135–145)

## 2022-06-21 LAB — GLUCOSE, CAPILLARY
Glucose-Capillary: 126 mg/dL — ABNORMAL HIGH (ref 70–99)
Glucose-Capillary: 51 mg/dL — ABNORMAL LOW (ref 70–99)
Glucose-Capillary: 168 mg/dL — ABNORMAL HIGH (ref 70–99)
Glucose-Capillary: 202 mg/dL — ABNORMAL HIGH (ref 70–99)
Glucose-Capillary: 137 mg/dL — ABNORMAL HIGH (ref 70–99)
Glucose-Capillary: 111 mg/dL — ABNORMAL HIGH (ref 70–99)

## 2022-06-21 LAB — CBC WITH DIFFERENTIAL/PLATELET
Abs Immature Granulocytes: 0.28 10*3/uL — ABNORMAL HIGH (ref 0.00–0.07)
Basophils Absolute: 0.1 10*3/uL (ref 0.0–0.1)
Basophils Relative: 0 %
Eosinophils Absolute: 0.2 10*3/uL (ref 0.0–0.5)
Eosinophils Relative: 1 %
HCT: 28.7 % — ABNORMAL LOW (ref 39.0–52.0)
Hemoglobin: 9.1 g/dL — ABNORMAL LOW (ref 13.0–17.0)
Immature Granulocytes: 2 %
Lymphocytes Relative: 11 %
Lymphs Abs: 1.5 10*3/uL (ref 0.7–4.0)
MCH: 30.7 pg (ref 26.0–34.0)
MCHC: 31.7 g/dL (ref 30.0–36.0)
MCV: 97 fL (ref 80.0–100.0)
Monocytes Absolute: 1.2 10*3/uL — ABNORMAL HIGH (ref 0.1–1.0)
Monocytes Relative: 8 %
Neutro Abs: 10.8 10*3/uL — ABNORMAL HIGH (ref 1.7–7.7)
Neutrophils Relative %: 78 %
Platelets: 342 10*3/uL (ref 150–400)
RBC: 2.96 MIL/uL — ABNORMAL LOW (ref 4.22–5.81)
RDW: 14.7 % (ref 11.5–15.5)
WBC: 14 10*3/uL — ABNORMAL HIGH (ref 4.0–10.5)
nRBC: 0 % (ref 0.0–0.2)

## 2022-06-21 MED ORDER — AMOXICILLIN-POT CLAVULANATE 500-125 MG PO TABS: 1.0000 | ORAL_TABLET | ORAL | Status: DC

## 2022-06-21 MED ORDER — SODIUM CHLORIDE 0.9 % IV SOLN
2.0000 g | INTRAVENOUS | Status: DC
  Administered 2022-06-22 – 2022-06-24 (×2): 2 g via INTRAVENOUS
  Filled 2022-06-21 (×2): qty 12.5

## 2022-06-21 MED ORDER — DEXTROSE 50 % IV SOLN
INTRAVENOUS | Status: AC
  Administered 2022-06-21: 50 mL
  Filled 2022-06-21: qty 50

## 2022-06-21 MED ORDER — DEXTROSE 50 % IV SOLN: 1.0000 | Freq: Once | INTRAVENOUS | Status: AC

## 2022-06-21 MED ORDER — INSULIN GLARGINE-YFGN 100 UNIT/ML ~~LOC~~ SOLN
10.0000 [IU] | Freq: Every day | SUBCUTANEOUS | Status: DC
  Filled 2022-06-21 (×2): qty 0.1

## 2022-06-21 MED ORDER — ACETAMINOPHEN 325 MG PO TABS
650.0000 mg | ORAL_TABLET | ORAL | Status: DC | PRN
  Administered 2022-06-22: 650 mg via ORAL
  Filled 2022-06-21: qty 2

## 2022-06-21 MED ORDER — MIDODRINE HCL 5 MG PO TABS
10.0000 mg | ORAL_TABLET | Freq: Three times a day (TID) | ORAL | Status: DC
  Administered 2022-06-21 – 2022-06-22 (×5): 10 mg via ORAL
  Filled 2022-06-21 (×7): qty 2

## 2022-06-21 MED ORDER — ACETAMINOPHEN 650 MG RE SUPP: 650.0000 mg | RECTAL | Status: DC | PRN

## 2022-06-21 NOTE — Progress Notes (Signed)
PROGRESS NOTE  Justin [name] WGN:[id number] DOB: 1945-12-18 DOA: 06/12/2022 PCP: Michell Heinrich, DO   Brief History:  76 year old male with a history of ESRD (MWF) atrial fibrillation/atrial flutter, hypertension, hyperlipidemia, prediabetes, tobacco abuse in remission presenting with 1 month history of buttock pain, right greater than left.  Patient stated that it started as a boil that subsequently burst.  After testing, he began noticing worsening pain in his perianal region with sitting.  It has progressively gotten worse.  He denies any fevers, chills, chest pain, shortness of breath, coughing, hemoptysis, nausea, vomiting, direct abdominal pain.  He has not been on any recent antibiotics.  Because of his worsening pain, the patient presented for further evaluation and treatment.  He last had dialysis on 06/12/2022.  In the ED, the patient had a low-grade temperature 100.2 F.  He was initially hypotensive with a blood pressure of 86/43.  WBC 17.2, hemoglobin 10.8, platelets 199,000.  Sodium 140, potassium 3.5, bicarbonate 32, serum creatinine 5.32.  CT of the abdomen and pelvis showed a multiloculated/fluid collection and gas collection in the perianal region from the perineum to the inferior buttocks bilaterally.  The patient was started on vancomycin and Zosyn.  General surgery was consulted to assist with management.  The patient's apixaban was held in preparation for surgery.   Assessment/Plan: Severe sepsis -Secondary to perirectal abscess -Presented with tachycardia, leukocytosis, and lactate 2.7 -06/14/22 arterial line inserted -06/14/22  CVL inserted by Dr. Arnoldo Morale, plan to DC 12/9 if no further pressor needed -Continue Zosyn for now - nephrology team started midodrine 10 mg TID to assist with weaning norepinephrine - weaned off norepinephrine 12/9; if holds and no further need for pressors, femoral line removed 12/9.   -pt remains off norepinephrine  Perirectal  abscess -General surgery consulted -restart apixaban now that central line removed -Echocardiogram EF 60-65%, low normal RVF, mod TR -I&D on 06/15/2022>>MDR E coli -surgery starting hydrogen peroxide irrigation treatments -remains on Zosyn with plan to transition to cefepime 12/11 with HD -will discuss length of therapy with surgery   Wide Complex tachycardia -?Vtach -Wide complex tachcyardia with thready pulse, received 15 seconds of CPR. Another short 5 second episode while in OR -appreciate cardiology--discussed with Dr. Harl Bowie -on amiodarone 200 mg BID x 3 weeks followed by 200 mg daily per cardiology recs   Acute respiratory Failure with hypoxia -left intubated post-op -personally reviewed CXR 12/4--no edema or infiltrates -Dr. Melvyn Novas consulted -06/16/22--extubated -now on 3L [place] with 100% sat  Pleuritic Chest Pain - resolved  - from frequent deep coughing - pain is reproducible with light palpation the chest wall - scheduled acetaminophen for anti-inflammatory treatment, change to PRN dosing 12/10 - cough syrup as needed.    Hyperglycemia from sepsis  - BS coming down precipitously as the infection improves - DC basal insulin and prandial insulin coverage due to hypoglycemia CBG (last 3)  Recent Labs    06/21/22 0738 06/21/22 0824 06/21/22 1103  GLUCAP 51* 126* 168*   Hypoglycemia from insulin - DC basal and prandial coverage   ESRD -He dialyzes on Monday Wednesday Friday -Nephrology consulted for next dialysis -Continue Velphoro and Sensipar -HD managed by nephrology service -next HD per nephrology team  Swollen left extremity - discussed with nephrologist Dr. Johnney Ou - they plan to have it evaluated   Atrial flutter -Holding apixaban in case a new central line needed; if not needed; resume 12/10 -treated with amiodarone gtt and on  12/7 transitioned to oral amiodarone -cardiology recommending amiodarone 200 mg BID x 3 weeks followed by 200 mg daily   Mixed  hyperlipidemia -Continue statin   Essential hypertension -Holding metoprolol secondary to hypotension   Obesity -BMI 33.58 -lifestyle modification     Family Communication: no family at bedside, discussed plan of care with patient who verbalized understanding; telephone call to niece 06/21/22.    Consultants:  renal, general surgery, cardiology, PCCM   Code Status:  FULL    DVT Prophylaxis:  apixaban restarted 06/21/22     Procedures: As Listed in Progress Note Above   Antibiotics: Vanc 12/1>>12/6 Zosyn 12/1>>12/10  Subjective: Pt c/o terrible food in hospital and not eating, he does say that the chest wall pain is resolved now.  No SOB.  Left arm edema slightly improved.      Objective: Vitals:   06/21/22 0800 06/21/22 0915 06/21/22 1000 06/21/22 1100  BP: 115/67 113/60 (!) 111/46   Pulse: 96     Resp: '20 19 16   '$ Temp:    97.6 F (36.4 C)  TempSrc:    Oral  SpO2: 98%  96%   Weight:      Height:        Intake/Output Summary (Last 24 hours) at 06/21/2022 1128 Last data filed at 06/20/2022 2200 Gross per 24 hour  Intake 94.3 ml  Output 1 ml  Net 93.3 ml   Weight change: 0.1 kg Exam:  General:  Pt is awake, alert, follows commands appropriately, not in acute distress HEENT: No icterus, No thrush, No neck mass, [place]/AT Cardiovascular: normal S1/S2, no rubs, no gallops: reproducible anterior left side chest pain with palpation.  No signs of rash or blisters seen.  Respiratory: rare crackles. No wheeze Abdomen: Soft/+BS, non tender, non distended, no guarding Extremities: No edema, No lymphangitis, No petechiae, No rashes, no synovitis  Data Reviewed: I have personally reviewed following labs and imaging studies Basic Metabolic Panel: Recent Labs  Lab 06/16/22 0355 06/17/22 0401 06/18/22 0423 06/19/22 0410 06/20/22 0553 06/21/22 0342  NA 133* 135 133* 132* 133* 132*  K 4.7 4.7 3.8 3.9 3.7 3.9  CL 94* 93* 94* 94* 95* 94*  CO2 '24 25 26 26 26 26  '$ GLUCOSE  275* 306* 242* 209* 87 65*  BUN 43* 68* 52* 61* 39* 49*  CREATININE 7.56* 9.37* 7.48* 9.05* 6.73* 8.68*  CALCIUM 8.8* 8.3* 8.1* 8.1* 8.0* 8.6*  MG 1.9  --  1.9  --   --   --   PHOS  --  6.4* 4.5 4.8* 4.4 6.0*   Liver Function Tests: Recent Labs  Lab 06/17/22 0401 06/18/22 0423 06/19/22 0410 06/20/22 0553 06/21/22 0342  ALBUMIN 2.2* 2.3* 2.2* 2.2* 2.3*   No results for input(s): "LIPASE", "AMYLASE" in the last 168 hours. No results for input(s): "AMMONIA" in the last 168 hours. Coagulation Profile: No results for input(s): "INR", "PROTIME" in the last 168 hours. CBC: Recent Labs  Lab 06/17/22 0401 06/18/22 0423 06/19/22 0410 06/20/22 0553 06/21/22 0342  WBC 21.8* 17.5* 17.5* 16.7* 14.0*  NEUTROABS  --  13.7* 13.3* 13.0* 10.8*  HGB 9.5* 9.8* 9.4* 8.9* 9.1*  HCT 29.5* 31.0* 29.9* 28.3* 28.7*  MCV 96.4 96.0 96.8 96.6 97.0  PLT 290 338 318 351 342   Cardiac Enzymes: No results for input(s): "CKTOTAL", "CKMB", "CKMBINDEX", "TROPONINI" in the last 168 hours. BNP: Invalid input(s): "POCBNP" CBG: Recent Labs  Lab 06/20/22 1737 06/20/22 2000 06/21/22 3094 06/21/22 0824 06/21/22 1103  GLUCAP 76 144* 51* 126* 168*   HbA1C: No results for input(s): "HGBA1C" in the last 72 hours.  Urine analysis:    Component Value Date/Time   COLORURINE AMBER (A) 05/27/2020 1451   APPEARANCEUR CLOUDY (A) 05/27/2020 1451   LABSPEC 1.017 05/27/2020 1451   PHURINE 6.0 05/27/2020 1451   GLUCOSEU 50 (A) 05/27/2020 1451   HGBUR LARGE (A) 05/27/2020 1451   BILIRUBINUR NEGATIVE 05/27/2020 1451   KETONESUR NEGATIVE 05/27/2020 1451   PROTEINUR 100 (A) 05/27/2020 1451   UROBILINOGEN 0.2 08/02/2010 1534   NITRITE NEGATIVE 05/27/2020 1451   LEUKOCYTESUR SMALL (A) 05/27/2020 1451   Recent Results (from the past 240 hour(s))  MRSA Next Gen by PCR, Nasal     Status: None   Collection Time: 06/13/22  1:30 AM   Specimen: Nasal Mucosa; Nasal Swab  Result Value Ref Range Status   MRSA by PCR  Next Gen NOT DETECTED NOT DETECTED Final    Comment: (NOTE) The GeneXpert MRSA Assay (FDA approved for NASAL specimens only), is one component of a comprehensive MRSA colonization surveillance program. It is not intended to diagnose MRSA infection nor to guide or monitor treatment for MRSA infections. Test performance is not FDA approved in patients less than 92 years old. Performed at [facility], [address], [place], [place] [address]   Aerobic/Anaerobic Culture w Gram Stain (surgical/deep wound)     Status: None   Collection Time: 06/15/22  1:44 PM   Specimen: PATH Other; Tissue  Result Value Ref Range Status   Specimen Description ABSCESS  Final   Special Requests PERIRECTAL  Final   Gram Stain   Final    FEW WBC PRESENT, PREDOMINANTLY PMN RARE GRAM POSITIVE COCCI IN CLUSTERS RARE GRAM POSITIVE RODS    Culture   Final    FEW ESCHERICHIA COLI NO ANAEROBES ISOLATED Performed at [facility], [address] [place], [place] [address]    Report Status 06/20/2022 FINAL  Final   Organism ID, Bacteria ESCHERICHIA COLI  Final      Susceptibility   Escherichia coli - MIC*    AMPICILLIN >=32 RESISTANT Resistant     CEFAZOLIN >=64 RESISTANT Resistant     CEFEPIME <=0.12 SENSITIVE Sensitive     CEFTAZIDIME 4 SENSITIVE Sensitive     CEFTRIAXONE <=0.25 SENSITIVE Sensitive     CIPROFLOXACIN >=4 RESISTANT Resistant     GENTAMICIN <=1 SENSITIVE Sensitive     IMIPENEM <=0.25 SENSITIVE Sensitive     TRIMETH/SULFA >=320 RESISTANT Resistant     AMPICILLIN/SULBACTAM >=32 RESISTANT Resistant     PIP/TAZO <=4 SENSITIVE Sensitive     * FEW ESCHERICHIA COLI    Scheduled Meds:  amiodarone  200 mg Oral BID   Chlorhexidine Gluconate Cloth  6 each Topical Daily   insulin aspart  0-6 Units Subcutaneous TID AC & HS   midodrine  10 mg Oral TID WC   pantoprazole  40 mg Oral QAC breakfast   polyethylene glycol  17 g Oral Daily   sodium chloride flush  10-40 mL Intracatheter Q12H    sucroferric oxyhydroxide  500 mg Oral BID WC   Continuous Infusions:  sodium chloride     [START ON 06/22/2022] ceFEPime (MAXIPIME) IV     piperacillin-tazobactam (ZOSYN) 2.25 g in sodium chloride 0.9 % 50 mL IVPB 2.25 g (06/21/22 0514)   Procedures/Studies: DG Chest Port 1 View  Result Date: 06/16/2022 CLINICAL DATA:  Acute respiratory failure with hypoxia and hypercapnia. Patient on vent.  EXAM: PORTABLE CHEST 1 VIEW COMPARISON:  AP chest 06/15/2022 (multiple studies), chest two views 01/28/2020 FINDINGS: Endotracheal tube tip terminates approximately 6.3 cm above the carina, at the inferior aspect of the clavicular heads and similar to prior. Enteric tube descends below the diaphragm with the tip and side port overlying the left upper abdominal quadrant. Moderately decreased lung volumes. Right mid and lower lung horizontal linear densities, likely subsegmental atelectasis. No focal left lung airspace opacity. No definite pleural effusion. No pneumothorax. No acute skeletal abnormality. IMPRESSION: 1. Endotracheal tube tip terminates approximately 6.3 cm above the carina and similar to prior. 2. Enteric tube descends below the diaphragm with the tip and side port overlying the left upper abdominal quadrant. 3. Right mid and lower lung linear opacities are similar to prior, probable atelectasis. It is difficult to exclude mild pneumonia. Electronically Signed   By: Yvonne Kendall M.D.   On: 06/16/2022 09:02   DG CHEST PORT 1 VIEW  Result Date: 06/15/2022 CLINICAL DATA:  OG tube placement EXAM: PORTABLE CHEST 1 VIEW COMPARISON:  06/15/2022, 2:47 p.m. FINDINGS: Interval placement of esophagogastric tube, tip and side port below the diaphragm. Otherwise unchanged rotated AP portable examination with endotracheal tube over the midtrachea, as well as bilateral perihilar heterogeneous and consolidative airspace opacities. No new airspace opacity. IMPRESSION: 1. Interval placement of esophagogastric tube, tip  and side port below the diaphragm. 2. Otherwise unchanged rotated AP portable examination with endotracheal tube over the midtrachea, as well as bilateral perihilar heterogeneous and consolidative airspace opacities. Electronically Signed   By: Delanna Ahmadi M.D.   On: 06/15/2022 18:15   DG Chest Port 1 View  Result Date: 06/15/2022 CLINICAL DATA:  ETT placement EXAM: PORTABLE CHEST 1 VIEW COMPARISON:  Chest x-ray dated July 18th 2021 FINDINGS: ETT tip is proximally 4.5 cm from the carina. Cardiac and mediastinal contours within normal limits. Left perihilar opacities and right mid lung linear opacities, likely due to atelectasis. No pleural effusion or pneumothorax. IMPRESSION: Left perihilar opacities and right mid lung linear opacities, likely due to atelectasis. Electronically Signed   By: Yetta Glassman M.D.   On: 06/15/2022 15:01   ECHOCARDIOGRAM COMPLETE  Result Date: 06/13/2022    ECHOCARDIOGRAM REPORT   Patient Name:   Justin [name] Date of Exam: 06/13/2022 Medical Rec #:  [id number]      Height:       70.0 in Accession #:    [id number]     Weight:       234.0 lb Date of Birth:  04/17/1946      BSA:          2.231 m Patient Age:    57 years       BP:           98/51 mmHg Patient Gender: M              HR:           109 bpm. Exam Location:  Inpatient Procedure: 2D Echo, Color Doppler, Cardiac Doppler and Intracardiac            Opacification Agent Indications:    Atrial Fibrillation I48.91  History:        Patient has prior history of Echocardiogram examinations, most                 recent 01/30/2020. Aortic Valve Disease, Arrythmias:Atrial                 Fibrillation; Risk Factors:Hypertension  and Dyslipidemia. End                 stage renal disease. Severe sepsis.  Sonographer:    Darlina Sicilian RDCS Referring Phys: [phone] [facility]  Sonographer Comments: Suboptimal apical window, suboptimal subcostal window, suboptimal parasternal window and Technically difficult study due to poor echo  windows. IMPRESSIONS  1. Left ventricular ejection fraction, by estimation, is 60 to 65%. The left ventricle has normal function. Left ventricular diastolic parameters are indeterminate.  2. Right ventricular systolic function is low normal. The right ventricular size is mildly enlarged. There is moderately elevated pulmonary artery systolic pressure.  3. The mitral valve is grossly normal. No evidence of mitral valve regurgitation. No evidence of mitral stenosis.  4. Tricuspid valve regurgitation is moderate.  5. Poor acoustic windows limit study Assessment of AV also limited by atrial fibrillation     AV is thickened, calcified Peak and mean gradients through the valve are 39 and 21 mm Hg Dimensionless index is 0.33     Overall ocnsistent with moderate AS. COmpared to echo report from 2021, mean gradient is increaed. The aortic valve is calcified. There is mild calcification of the aortic valve. Aortic valve regurgitation is not visualized. Moderate aortic valve stenosis. FINDINGS  Left Ventricle: Left ventricular ejection fraction, by estimation, is 60 to 65%. The left ventricle has normal function. Definity contrast agent was given IV to delineate the left ventricular endocardial borders. The left ventricular internal cavity size was normal in size. There is no left ventricular hypertrophy. Left ventricular diastolic parameters are indeterminate. Right Ventricle: The right ventricular size is mildly enlarged. No increase in right ventricular wall thickness. Right ventricular systolic function is low normal. There is moderately elevated pulmonary artery systolic pressure. The tricuspid regurgitant  velocity is 3.30 m/s, and with an assumed right atrial pressure of 8 mmHg, the estimated right ventricular systolic pressure is 98.9 mmHg. Left Atrium: Left atrial size was normal in size. Right Atrium: Right atrial size was normal in size. Pericardium: There is no evidence of pericardial effusion. Mitral Valve: The  mitral valve is grossly normal. No evidence of mitral valve regurgitation. No evidence of mitral valve stenosis. Tricuspid Valve: The tricuspid valve is normal in structure. Tricuspid valve regurgitation is moderate . No evidence of tricuspid stenosis. Aortic Valve: Poor acoustic windows limit study Assessment of AV also limited by atrial fibrillation AV is thickened, calcified Peak and mean gradients through the valve are 39 and 21 mm Hg Dimensionless index is 0.33 Overall ocnsistent with moderate AS. COmpared to echo report from 2021, mean gradient is increaed. The aortic valve is calcified. There is mild calcification of the aortic valve. Aortic valve regurgitation is not visualized. Moderate aortic stenosis is present. Aortic valve mean gradient measures 28.0 mmHg. Aortic valve peak gradient measures 44.6 mmHg. Aortic valve area, by VTI measures 1.03 cm. Pulmonic Valve: The pulmonic valve was grossly normal. Pulmonic valve regurgitation is not visualized. No evidence of pulmonic stenosis. Aorta: The aortic root and ascending aorta are structurally normal, with no evidence of dilitation. IAS/Shunts: No atrial level shunt detected by color flow Doppler.  LEFT VENTRICLE PLAX 2D LVIDd:         4.40 cm   Diastology LVIDs:         2.50 cm   LV e' medial:    10.55 cm/s LV PW:         0.90 cm   LV E/e' medial:  12.9 LV IVS:  0.90 cm   LV e' lateral:   13.05 cm/s LVOT diam:     2.00 cm   LV E/e' lateral: 10.5 LV SV:         65 LV SV Index:   29 LVOT Area:     3.14 cm  RIGHT VENTRICLE RV S prime:     13.20 cm/s TAPSE (M-mode): 2.2 cm LEFT ATRIUM           Index LA diam:      4.10 cm 1.84 cm/m LA Vol (A4C): 66.4 ml 29.76 ml/m  AORTIC VALVE AV Area (Vmax):    0.98 cm AV Area (Vmean):   1.03 cm AV Area (VTI):     1.03 cm AV Vmax:           334.00 cm/s AV Vmean:          254.000 cm/s AV VTI:            0.634 m AV Peak Grad:      44.6 mmHg AV Mean Grad:      28.0 mmHg LVOT Vmax:         104.00 cm/s LVOT Vmean:         83.500 cm/s LVOT VTI:          0.207 m LVOT/AV VTI ratio: 0.33  AORTA Ao Root diam: 3.40 cm Ao Asc diam:  3.70 cm MITRAL VALVE                TRICUSPID VALVE MV Area (PHT): 6.83 cm     TR Peak grad:   43.6 mmHg MV Decel Time: 111 msec     TR Vmax:        330.00 cm/s MV E velocity: 136.50 cm/s                             SHUNTS                             Systemic VTI:  0.21 m                             Systemic Diam: 2.00 cm Dorris Carnes MD Electronically signed by Dorris Carnes MD Signature Date/Time: 06/13/2022/2:38:06 PM    Final    CT PELVIS W CONTRAST  Result Date: 06/12/2022 CLINICAL DATA:  Left buttock abscess for 1 month EXAM: CT PELVIS WITH CONTRAST TECHNIQUE: Multidetector CT imaging of the pelvis was performed using the standard protocol following the bolus administration of intravenous contrast. RADIATION DOSE REDUCTION: This exam was performed according to the departmental dose-optimization program which includes automated exposure control, adjustment of the mA and/or kV according to patient size and/or use of iterative reconstruction technique. CONTRAST:  175m OMNIPAQUE IOHEXOL 300 MG/ML  SOLN COMPARISON:  05/05/2022, 05/27/2020 FINDINGS: Urinary Tract: Distal ureters are unremarkable. Bladder is decompressed, limiting its evaluation. Bowel: No bowel obstruction or ileus. Normal appendix right lower quadrant. No bowel wall thickening or inflammatory change. Vascular/Lymphatic: Atherosclerosis of the aorta and its distal branches. No pathologically enlarged lymph nodes within the pelvis. Reproductive: Prostate is enlarged, measuring 5.5 x 4.3 cm. Fiduciary markers are seen within the prostate. Other: There is no free fluid or free intraperitoneal gas. Fat containing umbilical hernia is noted. No bowel herniation. Musculoskeletal: Multilocular gas and fluid collections are seen within the buttocks, consistent with perianal  abscesses. The largest gas and fluid collection within the right gluteal  region measures 6.3 x 2.7 cm, reference image 50/2. This extends anteriorly into the perineum. More inferiorly within the buttocks are multilocular fluid collections, measuring up to 2.9 x 2.5 cm on the right and 3.8 x 2.5 cm on the left. I do not see any direct communication with the bowel lumen to suggest fistula. IMPRESSION: 1. Multilocular gas and fluid collection within the perianal region, extending from the perineum into the bilateral inferior buttocks, consistent with abscess. I do not see any communication between the gas and fluid collections and the bowel lumen. 2. Enlarged prostate. 3.  Aortic Atherosclerosis (ICD10-I70.0). Electronically Signed   By: Randa Ngo M.D.   On: 06/12/2022 20:01    Irwin Brakeman, MD  [facility]  If 7PM-7AM, please contact night-coverage [email or web address] Password [email or web address] 06/21/2022, 11:28 AM   LOS: 9 days

## 2022-06-21 NOTE — Progress Notes (Signed)
Pharmacy Antibiotic Note  Justin [name] is a 76 y.o. male admitted on 06/12/2022 with  wound infection .  Pharmacy has been consulted for cefepime dosing.  ESRD pt who presented with wound infection. (Perirectal abscess) Improving but still requiring abx tx.  Patient has E.Coli in wound cx that is resistant to unasyn S- zosyn, ceftriaxone  R- cipro, septra, unasyn. Unable to switch to po therapy with resistant profile. D/W Dr. Wynetta Emery  Plan: Continue  Zosyn 2.25g IV q8h until Am, then transition to  Cefepime 2gm  every MWF w/ HD Monitor cultures and clinical progress F/U LOT  Height: '5\' 10"'$  (177.8 cm) Weight: 114 kg (251 lb 5.2 oz) IBW/kg (Calculated) : 73  Temp (24hrs), Avg:97.9 F (36.6 C), Min:97.7 F (36.5 C), Max:98 F (36.7 C)  Recent Labs  Lab 06/17/22 0401 06/18/22 0423 06/19/22 0410 06/20/22 0553 06/21/22 0342  WBC 21.8* 17.5* 17.5* 16.7* 14.0*  CREATININE 9.37* 7.48* 9.05* 6.73* 8.68*     Estimated Creatinine Clearance: 8.9 mL/min (A) (by C-G formula based on SCr of 8.68 mg/dL (H)).    No Known Allergies  Antimicrobials this admission: 12/1 vanc>> 12/8 12/1 zosyn>>12/11 12/11 Cefepime 12/11>>  Microbiology results: 12/4 Wound Abscess cx: E.Coli S- zosyn, cefepime, ceftriaxone R- cipro, cefazolin, unasyn 12/2 MRSA PCR: neg  Justin [name], BS Pharm D, BCPS Clinical Pharmacist 06/21/2022 10:28 AM

## 2022-06-21 NOTE — Progress Notes (Signed)
[facility] Progress Note  6 Days Post-Op  Subjective: Patient seen and examined.  He is just getting up off of the commode after having a bowel movement.  He is tolerating a diet without nausea and vomiting.  He has been off Levophed for over 24 hours.  Objective: Vital signs in last 24 hours: Temp:  [97.6 F (36.4 C)-98 F (36.7 C)] 97.6 F (36.4 C) (12/10 1100) Pulse Rate:  [34-96] 96 (12/10 0800) Resp:  [13-30] 16 (12/10 1000) BP: (90-133)/(23-100) 111/46 (12/10 1000) SpO2:  [94 %-100 %] 96 % (12/10 1000) Weight:  [947 kg] 114 kg (12/10 0427) Last BM Date : 06/20/22  Intake/Output from previous day: 12/09 0701 - 12/10 0700 In: 304.6 [I.V.:161.5; IV Piggyback:143.1] Out: 1 [Stool:1] Intake/Output this shift: Total I/O In: -  Out: 1 [Stool:1]  General appearance: alert, cooperative, and no distress Pelvic: Perineal wounds open with some surrounding induration, minimal drainage  Lab Results:  Recent Labs    06/20/22 0553 06/21/22 0342  WBC 16.7* 14.0*  HGB 8.9* 9.1*  HCT 28.3* 28.7*  PLT 351 342   BMET Recent Labs    06/20/22 0553 06/21/22 0342  NA 133* 132*  K 3.7 3.9  CL 95* 94*  CO2 26 26  GLUCOSE 87 65*  BUN 39* 49*  CREATININE 6.73* 8.68*  CALCIUM 8.0* 8.6*   PT/INR No results for input(s): "LABPROT", "INR" in the last 72 hours.  Studies/Results: No results found.  Anti-infectives: Anti-infectives (From admission, onward)    Start     Dose/Rate Route Frequency Ordered Stop   06/22/22 1600  amoxicillin-clavulanate (AUGMENTIN) 500-125 MG per tablet 1 tablet  Status:  Discontinued        1 tablet Oral Every M-W-F (Hemodialysis) 06/21/22 0811 06/21/22 1024   06/22/22 1600  ceFEPIme (MAXIPIME) 2 g in sodium chloride 0.9 % 100 mL IVPB        2 g 200 mL/hr over 30 Minutes Intravenous Every M-W-F (Hemodialysis) 06/21/22 1024     06/15/22 1200  vancomycin (VANCOCIN) IVPB 1000 mg/200 mL premix  Status:  Discontinued        1,000  mg 200 mL/hr over 60 Minutes Intravenous Every M-W-F (Hemodialysis) 06/15/22 1048 06/19/22 1057   06/13/22 1400  piperacillin-tazobactam (ZOSYN) 2.25 g in sodium chloride 0.9 % 50 mL IVPB        2.25 g 100 mL/hr over 30 Minutes Intravenous Every 8 hours 06/13/22 1038 06/22/22 1359   06/13/22 0600  piperacillin-tazobactam (ZOSYN) IVPB 2.25 g  Status:  Discontinued        2.25 g 100 mL/hr over 30 Minutes Intravenous Every 8 hours 06/12/22 2038 06/13/22 1037   06/12/22 2045  vancomycin (VANCOREADY) IVPB 1500 mg/300 mL        1,500 mg 150 mL/hr over 120 Minutes Intravenous  Once 06/12/22 2038 06/13/22 0043   06/12/22 2045  piperacillin-tazobactam (ZOSYN) IVPB 3.375 g        3.375 g 100 mL/hr over 30 Minutes Intravenous  Once 06/12/22 2038 06/12/22 2154       Assessment/Plan:  Patient is a 76 year old male who is status post irrigation and debridement of perirectal abscess on 12/4.  Blood cultures demonstrating E. coli.   -Continue with twice daily irrigation of perineal wounds with peroxide.  Cover with ABD pad to allow for drainage -Leukocytosis continues to improve, 14 from 16.7 -Femoral CVC removed yesterday   LOS: 9 days    Salene Mohamud A Jymir Dunaj 06/21/2022

## 2022-06-21 NOTE — Progress Notes (Addendum)
 [facility] Progress Note   Assessment/ Plan:     1. Severe sepsis/ perirectal abscess: s/p I and D 12/4 with gen surg.  Vanc/ Zosyn.  Off pressors now.  Remains on midodrine.   2. ESRD:  [place].  MWF, using AVF.  HD on schedule 06/19/22, next 12/10. Issues with access BFR not > 233m/min and LUE edema.  Have d/w HD RN - will see how AVF performs with HD tomorrow and make decision if needs angiogram while in vs outpt.  UF limited by hypotension and +9L for admission - will try for 3L UF with HD tomorrow - remains on RA currently.   3. 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 Nutrition: renal diet with fluid restriction  6. S/p cardiac arrest- brief, in OR, ROSC achieved, Vtach  7.  Afib: on amio and OP AC    Subjective:   Off pressors now.  Still with LUE swelling.  C/o neuropathic pain in feet.  On RA. No family in room.   Objective:   BP (!) 111/46   Pulse 96   Temp 97.6 F (36.4 C) (Oral)   Resp 16   Ht '5\' 10"'$  (1.778 m)   Wt 114 kg   SpO2 96%   BMI 36.06 kg/m   Physical Exam: Gen:NAD, lying in bed CVS: irregular, soft systolic murmur Resp: clear anteriorly Abd: soft, nontender, NABS Ext: trace LE edema, LUE AVF +t/b - arm with 1+ pitting edema to hand.   Labs: BMET Recent Labs  Lab 06/15/22 0356 06/15/22 1243 06/16/22 0355 06/17/22 0401 06/18/22 0423 06/19/22 0410 06/20/22 0553 06/21/22 0342  NA 136 134* 133* 135 133* 132* 133* 132*  K 4.4 3.7 4.7 4.7 3.8 3.9 3.7 3.9  CL 94* 93* 94* 93* 94* 94* 95* 94*  CO2 26  --  '24 25 26 26 26 26  '$ GLUCOSE 196* 177* 275* 306* 242* 209* 87 65*  BUN 55* 26* 43* 68* 52* 61* 39* 49*  CREATININE 10.39* 6.40* 7.56* 9.37* 7.48* 9.05* 6.73* 8.68*  CALCIUM 8.3*  --  8.8* 8.3* 8.1* 8.1* 8.0* 8.6*  PHOS 5.1*  --   --  6.4* 4.5 4.8* 4.4 6.0*    CBC Recent Labs  Lab 06/18/22 0423 06/19/22 0410 06/20/22 0553 06/21/22 0342  WBC 17.5* 17.5* 16.7* 14.0*  NEUTROABS 13.7* 13.3*  13.0* 10.8*  HGB 9.8* 9.4* 8.9* 9.1*  HCT 31.0* 29.9* 28.3* 28.7*  MCV 96.0 96.8 96.6 97.0  PLT 338 318 351 342       Medications:     amiodarone  200 mg Oral BID   Chlorhexidine Gluconate Cloth  6 each Topical Daily   insulin aspart  0-6 Units Subcutaneous TID AC & HS   midodrine  10 mg Oral TID WC   pantoprazole  40 mg Oral QAC breakfast   polyethylene glycol  17 g Oral Daily   sodium chloride flush  10-40 mL Intracatheter Q12H   sucroferric oxyhydroxide  500 mg Oral BID WC

## 2022-06-22 ENCOUNTER — Inpatient Hospital Stay (HOSPITAL_COMMUNITY): Payer: Medicare Other

## 2022-06-22 LAB — CBC WITH DIFFERENTIAL/PLATELET
Abs Immature Granulocytes: 0.24 10*3/uL — ABNORMAL HIGH (ref 0.00–0.07)
Basophils Absolute: 0.1 10*3/uL (ref 0.0–0.1)
Basophils Relative: 1 %
Eosinophils Absolute: 0.2 10*3/uL (ref 0.0–0.5)
Eosinophils Relative: 2 %
HCT: 29.2 % — ABNORMAL LOW (ref 39.0–52.0)
Hemoglobin: 9.3 g/dL — ABNORMAL LOW (ref 13.0–17.0)
Immature Granulocytes: 2 %
Lymphocytes Relative: 10 %
Lymphs Abs: 1.5 10*3/uL (ref 0.7–4.0)
MCH: 30.8 pg (ref 26.0–34.0)
MCHC: 31.8 g/dL (ref 30.0–36.0)
MCV: 96.7 fL (ref 80.0–100.0)
Monocytes Absolute: 1.3 10*3/uL — ABNORMAL HIGH (ref 0.1–1.0)
Monocytes Relative: 9 %
Neutro Abs: 11.6 10*3/uL — ABNORMAL HIGH (ref 1.7–7.7)
Neutrophils Relative %: 76 %
Platelets: 404 10*3/uL — ABNORMAL HIGH (ref 150–400)
RBC: 3.02 MIL/uL — ABNORMAL LOW (ref 4.22–5.81)
RDW: 15.4 % (ref 11.5–15.5)
WBC: 15 10*3/uL — ABNORMAL HIGH (ref 4.0–10.5)
nRBC: 0 % (ref 0.0–0.2)

## 2022-06-22 LAB — RENAL FUNCTION PANEL
Albumin: 2.4 g/dL — ABNORMAL LOW (ref 3.5–5.0)
Anion gap: 13 (ref 5–15)
BUN: 58 mg/dL — ABNORMAL HIGH (ref 8–23)
CO2: 25 mmol/L (ref 22–32)
Calcium: 8.9 mg/dL (ref 8.9–10.3)
Chloride: 97 mmol/L — ABNORMAL LOW (ref 98–111)
Creatinine, Ser: 10.39 mg/dL — ABNORMAL HIGH (ref 0.61–1.24)
GFR, Estimated: 5 mL/min — ABNORMAL LOW (ref >60)
Glucose, Bld: 97 mg/dL (ref 70–99)
Phosphorus: 6.5 mg/dL — ABNORMAL HIGH (ref 2.5–4.6)
Potassium: 4.1 mmol/L (ref 3.5–5.1)
Sodium: 135 mmol/L (ref 135–145)

## 2022-06-22 LAB — GLUCOSE, CAPILLARY
Glucose-Capillary: 90 mg/dL (ref 70–99)
Glucose-Capillary: 117 mg/dL — ABNORMAL HIGH (ref 70–99)
Glucose-Capillary: 117 mg/dL — ABNORMAL HIGH (ref 70–99)
Glucose-Capillary: 106 mg/dL — ABNORMAL HIGH (ref 70–99)

## 2022-06-22 MED ORDER — PENTAFLUOROPROP-TETRAFLUOROETH EX AERO
INHALATION_SPRAY | CUTANEOUS | Status: AC
  Filled 2022-06-22: qty 30

## 2022-06-22 MED ORDER — ACETAMINOPHEN 325 MG PO TABS
650.0000 mg | ORAL_TABLET | ORAL | Status: DC
  Administered 2022-06-22 – 2022-06-26 (×19): 650 mg via ORAL
  Filled 2022-06-22 (×21): qty 2

## 2022-06-22 MED ORDER — ACETAMINOPHEN 650 MG RE SUPP: 650.0000 mg | RECTAL | Status: DC

## 2022-06-22 NOTE — Procedures (Signed)
   HEMODIALYSIS TREATMENT NOTE:   AVF cannulation with 16g needles (antegrade).  Unable to completely advance art needle.  Venous pressures continue to run high relative to blood flows.  Average Qb 220 with VPs 250-270.  3.5 hour session completed.  Net UF 2.8 liters.  BP and HR remained stable throughout session.  Cefepime 2g given during last 30 minutes.  All blood was returned.  Hand-off given to Charma Igo, RN.    Rockwell Alexandria, RN

## 2022-06-22 NOTE — Plan of Care (Signed)
  Problem: Acute Rehab PT Goals(only PT should resolve) Goal: Pt Will Go Supine/Side To Sit Outcome: Progressing Flowsheets (Taken 06/22/2022 1604) Pt will go Supine/Side to Sit:  with min guard assist  with supervision Goal: Patient Will Transfer Sit To/From Stand Outcome: Progressing Flowsheets (Taken 06/22/2022 1604) Patient will transfer sit to/from stand:  with min guard assist  with supervision Goal: Pt Will Transfer Bed To Chair/Chair To Bed Outcome: Progressing Flowsheets (Taken 06/22/2022 1604) Pt will Transfer Bed to Chair/Chair to Bed:  min guard assist  with min assist Goal: Pt Will Ambulate Outcome: Progressing Flowsheets (Taken 06/22/2022 1604) Pt will Ambulate:  25 feet  with minimal assist  with rolling walker  with min guard assist   4:04 PM, 06/22/22 Lonell Grandchild, MPT Physical Therapist with [facility] [phone] office [phone] mobile phone

## 2022-06-22 NOTE — Progress Notes (Signed)
Patient ID: Justin [name], male   DOB: Feb 13, 1946, 76 y.o.   MRN: [id number] S: Complaining of left jaw pain that started at 2 am and making it difficult to eat. O:BP (!) 150/80   Pulse 96   Temp 97.6 F (36.4 C) (Oral)   Resp 20   Ht '5\' 10"'$  (1.778 m)   Wt 114 kg   SpO2 95%   BMI 36.06 kg/m   Intake/Output Summary (Last 24 hours) at 06/22/2022 0911 Last data filed at 06/21/2022 1139 Gross per 24 hour  Intake --  Output 1 ml  Net -1 ml   Intake/Output: I/O last 3 completed shifts: In: -  Out: 2 [Stool:2]  Intake/Output this shift:  No intake/output data recorded. Weight change:  Gen: NAD CVS:RRR Resp: CTA Abd: +BS, soft, NT/ND Ext: LUE AVF +T/B, left arm edema, trace pretibial edema bilaterally  Recent Labs  Lab 06/16/22 0355 06/17/22 0401 06/18/22 0423 06/19/22 0410 06/20/22 0553 06/21/22 0342 06/22/22 0458  NA 133* 135 133* 132* 133* 132* 135  K 4.7 4.7 3.8 3.9 3.7 3.9 4.1  CL 94* 93* 94* 94* 95* 94* 97*  CO2 '24 25 26 26 26 26 25  '$ GLUCOSE 275* 306* 242* 209* 87 65* 97  BUN 43* 68* 52* 61* 39* 49* 58*  CREATININE 7.56* 9.37* 7.48* 9.05* 6.73* 8.68* 10.39*  ALBUMIN  --  2.2* 2.3* 2.2* 2.2* 2.3* 2.4*  CALCIUM 8.8* 8.3* 8.1* 8.1* 8.0* 8.6* 8.9  PHOS  --  6.4* 4.5 4.8* 4.4 6.0* 6.5*   Liver Function Tests: Recent Labs  Lab 06/20/22 0553 06/21/22 0342 06/22/22 0458  ALBUMIN 2.2* 2.3* 2.4*   No results for input(s): "LIPASE", "AMYLASE" in the last 168 hours. No results for input(s): "AMMONIA" in the last 168 hours. CBC: Recent Labs  Lab 06/18/22 0423 06/19/22 0410 06/20/22 0553 06/21/22 0342 06/22/22 0458  WBC 17.5* 17.5* 16.7* 14.0* 15.0*  NEUTROABS 13.7* 13.3* 13.0* 10.8* 11.6*  HGB 9.8* 9.4* 8.9* 9.1* 9.3*  HCT 31.0* 29.9* 28.3* 28.7* 29.2*  MCV 96.0 96.8 96.6 97.0 96.7  PLT 338 318 351 342 404*   Cardiac Enzymes: No results for input(s): "CKTOTAL", "CKMB", "CKMBINDEX", "TROPONINI" in the last 168 hours. CBG: Recent Labs  Lab  06/21/22 1103 06/21/22 1152 06/21/22 1622 06/21/22 2047 06/22/22 0756  GLUCAP 168* 202* 137* 111* 90    Iron Studies: No results for input(s): "IRON", "TIBC", "TRANSFERRIN", "FERRITIN" in the last 72 hours. Studies/Results: No results found.  amiodarone  200 mg Oral BID   Chlorhexidine Gluconate Cloth  6 each Topical Daily   insulin aspart  0-6 Units Subcutaneous TID AC & HS   midodrine  10 mg Oral TID WC   pantoprazole  40 mg Oral QAC breakfast   polyethylene glycol  17 g Oral Daily   sodium chloride flush  10-40 mL Intracatheter Q12H   sucroferric oxyhydroxide  500 mg Oral BID WC    BMET    Component Value Date/Time   NA 135 06/22/2022 0458   K 4.1 06/22/2022 0458   CL 97 (L) 06/22/2022 0458   CO2 25 06/22/2022 0458   GLUCOSE 97 06/22/2022 0458   BUN 58 (H) 06/22/2022 0458   CREATININE 10.39 (H) 06/22/2022 0458   CALCIUM 8.9 06/22/2022 0458   GFRNONAA 5 (L) 06/22/2022 0458   GFRAA 8 (L) 02/01/2020 0424   CBC    Component Value Date/Time   WBC 15.0 (H) 06/22/2022 0458   RBC 3.02 (L) 06/22/2022 3710  HGB 9.3 (L) 06/22/2022 0458   HCT 29.2 (L) 06/22/2022 0458   PLT 404 (H) 06/22/2022 0458   MCV 96.7 06/22/2022 0458   MCH 30.8 06/22/2022 0458   MCHC 31.8 06/22/2022 0458   RDW 15.4 06/22/2022 0458   LYMPHSABS 1.5 06/22/2022 0458   MONOABS 1.3 (H) 06/22/2022 0458   EOSABS 0.2 06/22/2022 0458   BASOSABS 0.1 06/22/2022 0458     Assessment/Plan: 1. Severe sepsis/ perirectal abscess: s/p I and D 12/4 with gen surg.  Vanc/ Zosyn.  Off pressors now.  Remains on midodrine. Bp's have improved.   2. ESRD:  [place].  MWF, using AVF.  HD on schedule 06/19/22, next 12/10. Issues with access BFR not > 269m/min and LUE edema.  Have d/w HD RN - will see how AVF performs with HD today and make decision if needs angiogram while in vs outpt.  UF limited by hypotension and +9L for admission - will try for 3L UF with HD tomorrow - remains on RA currently.    3. 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 Nutrition: renal diet with fluid restriction   6. S/p cardiac arrest- brief, in OR, ROSC achieved, Vtach   7.  Afib: on amio and OP AC  JDonetta Potts MD [facility]

## 2022-06-22 NOTE — Progress Notes (Signed)
7 Days Post-Op  Subjective: Denies any perirectal pain.  Objective: Vital signs in last 24 hours: Temp:  [97.4 F (36.3 C)-98.2 F (36.8 C)] 97.7 F (36.5 C) (12/11 1145) Resp:  [14-23] 20 (12/11 0606) BP: (92-154)/(33-91) 150/80 (12/11 0606) SpO2:  [94 %-99 %] 95 % (12/11 0753) Last BM Date : 06/20/22  Intake/Output from previous day: 12/10 0701 - 12/11 0700 In: -  Out: 1 [Stool:1] Intake/Output this shift: No intake/output data recorded.  General appearance: alert, cooperative, and no distress  Lab Results:  Recent Labs    06/21/22 0342 06/22/22 0458  WBC 14.0* 15.0*  HGB 9.1* 9.3*  HCT 28.7* 29.2*  PLT 342 404*   BMET Recent Labs    06/21/22 0342 06/22/22 0458  NA 132* 135  K 3.9 4.1  CL 94* 97*  CO2 26 25  GLUCOSE 65* 97  BUN 49* 58*  CREATININE 8.68* 10.39*  CALCIUM 8.6* 8.9   PT/INR No results for input(s): "LABPROT", "INR" in the last 72 hours.  Studies/Results: No results found.  Anti-infectives: Anti-infectives (From admission, onward)    Start     Dose/Rate Route Frequency Ordered Stop   06/22/22 1600  amoxicillin-clavulanate (AUGMENTIN) 500-125 MG per tablet 1 tablet  Status:  Discontinued        1 tablet Oral Every M-W-F (Hemodialysis) 06/21/22 0811 06/21/22 1024   06/22/22 1600  ceFEPIme (MAXIPIME) 2 g in sodium chloride 0.9 % 100 mL IVPB        2 g 200 mL/hr over 30 Minutes Intravenous Every M-W-F (Hemodialysis) 06/21/22 1024     06/15/22 1200  vancomycin (VANCOCIN) IVPB 1000 mg/200 mL premix  Status:  Discontinued        1,000 mg 200 mL/hr over 60 Minutes Intravenous Every M-W-F (Hemodialysis) 06/15/22 1048 06/19/22 1057   06/13/22 1400  piperacillin-tazobactam (ZOSYN) 2.25 g in sodium chloride 0.9 % 50 mL IVPB        2.25 g 100 mL/hr over 30 Minutes Intravenous Every 8 hours 06/13/22 1038 06/22/22 0806   06/13/22 0600  piperacillin-tazobactam (ZOSYN) IVPB 2.25 g  Status:  Discontinued        2.25 g 100 mL/hr over 30 Minutes  Intravenous Every 8 hours 06/12/22 2038 06/13/22 1037   06/12/22 2045  vancomycin (VANCOREADY) IVPB 1500 mg/300 mL        1,500 mg 150 mL/hr over 120 Minutes Intravenous  Once 06/12/22 2038 06/13/22 0043   06/12/22 2045  piperacillin-tazobactam (ZOSYN) IVPB 3.375 g        3.375 g 100 mL/hr over 30 Minutes Intravenous  Once 06/12/22 2038 06/12/22 2154       Assessment/Plan: s/p Procedure(s): IRRIGATION AND DEBRIDEMENT PERIRECTAL ABSCESS Impression: Postoperative day 7, status post I&D of perirectal abscess.  Final cultures were positive for E. coli.  There is no p.o. antibiotic to cover this E. coli, thus he is receiving cefepime on dialysis.  In discussion with Dr. Wynetta Emery, I would continue this for 1 week.  This would be a total of 2-week course after undergoing incision and drainage.  Will follow peripherally with you.  LOS: 10 days    Aviva Signs 06/22/2022

## 2022-06-22 NOTE — Evaluation (Signed)
Physical Therapy Evaluation Patient Details Name: Justin [name] MRN: [id number] DOB: Jan 01, 1946 Today's Date: 06/22/2022  History of Present Illness  Justin [name] is a 76 y.o. male with medical history significant of ESRD on HD (MWF), atrial fibrillation on Eliquis, GERD, hyperlipidemia, hypertension who presents to the emergency department due to 1 month of progressive pain in the buttock.  Patient states that it started with a boil and that after the boil bursts, he started to feel pain when he tries to sit, the pain is progressively worsened since onset of symptoms, so he decided to go to the ED for further evaluation and management.  warm compresses used at home did not relieve the symptoms.  About 2 months ago, patient states that he had an abscess to his back that required incision and drainage and that he had to take antibiotics for this. Patient denies chest pain, shortness of breath, palpitations, headache, nausea, vomiting, abdominal pain.  Last dialysis was today.   Clinical Impression  Patient demonstrates slow labored movement for sitting up at bedside, limited to a few slow labored steps at bedside before having to sit due to c/o fatigue and generalized weakness.  Patient on room air with SpO2 at 95% during activities and tolerated sitting up in chair after therapy - nurse notified.  Patient will benefit from continued skilled physical therapy in hospital and recommended venue below to increase strength, balance, endurance for safe ADLs and gait.         Recommendations for follow up therapy are one component of a multi-disciplinary discharge planning process, led by the attending physician.  Recommendations may be updated based on patient status, additional functional criteria and insurance authorization.  Follow Up Recommendations Skilled nursing-short term rehab (<3 hours/day) Can patient physically be transported by private vehicle: Yes    Assistance Recommended at Discharge  Set up Supervision/Assistance  Patient can return home with the following  A lot of help with walking and/or transfers;A little help with bathing/dressing/bathroom;Assistance with cooking/housework;Help with stairs or ramp for entrance    Equipment Recommendations None recommended by PT  Recommendations for Other Services       Functional Status Assessment Patient has had a recent decline in their functional status and demonstrates the ability to make significant improvements in function in a reasonable and predictable amount of time.     Precautions / Restrictions Precautions Precautions: Fall Restrictions Weight Bearing Restrictions: No      Mobility  Bed Mobility Overal bed mobility: Needs Assistance Bed Mobility: Supine to Sit     Supine to sit: Min assist, Mod assist     General bed mobility comments: increased time, labored movement    Transfers Overall transfer level: Needs assistance Equipment used: Rolling walker (2 wheels) Transfers: Sit to/from Stand, Bed to chair/wheelchair/BSC Sit to Stand: Min assist, Mod assist   Step pivot transfers: Min assist, Mod assist       General transfer comment: slow labored movement    Ambulation/Gait Ambulation/Gait assistance: Min assist, Mod assist Gait Distance (Feet): 4 Feet Assistive device: Rolling walker (2 wheels) Gait Pattern/deviations: Decreased step length - right, Decreased step length - left, Decreased stride length Gait velocity: slow     General Gait Details: limited to a few slow labored steps at bedside before having to sit due to fatigue and generalized weakness  Stairs            Wheelchair Mobility    Modified Rankin (Stroke Patients Only)  Balance Overall balance assessment: Needs assistance Sitting-balance support: Feet supported, No upper extremity supported Sitting balance-Leahy Scale: Fair Sitting balance - Comments: fair/good seated at EOB   Standing balance support:  During functional activity, Bilateral upper extremity supported, Reliant on assistive device for balance Standing balance-Leahy Scale: Fair Standing balance comment: using RW                             Pertinent Vitals/Pain Pain Assessment Pain Assessment: No/denies pain    Home Living Family/patient expects to be discharged to:: Private residence Living Arrangements: Alone Available Help at Discharge: Family;Available PRN/intermittently Type of Home: Apartment Home Access: Stairs to enter Entrance Stairs-Rails: Right Entrance Stairs-Number of Steps: 3   Home Layout: One level Home Equipment: Conservation officer, nature (2 wheels);Cane - single point;Shower seat      Prior Function Prior Level of Function : Independent/Modified Independent             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, sometimes uses RW, drives ADLs Comments: Independent     Hand Dominance        Extremity/Trunk Assessment   Upper Extremity Assessment Upper Extremity Assessment: Generalized weakness    Lower Extremity Assessment Lower Extremity Assessment: Generalized weakness    Cervical / Trunk Assessment Cervical / Trunk Assessment: Normal  Communication   Communication: No difficulties  Cognition Arousal/Alertness: Awake/alert Behavior During Therapy: WFL for tasks assessed/performed Overall Cognitive Status: Within Functional Limits for tasks assessed                                          General Comments      Exercises     Assessment/Plan    PT Assessment Patient needs continued PT services  PT Problem List Decreased strength;Decreased activity tolerance;Decreased balance;Decreased mobility       PT Treatment Interventions DME instruction;Gait training;Stair training;Functional mobility training;Therapeutic activities;Therapeutic exercise;Balance training;Patient/family education    PT Goals (Current goals can be found  in the Care Plan section)  Acute Rehab PT Goals Patient Stated Goal: return home after rehab PT Goal Formulation: With patient Time For Goal Achievement: 07/06/22 Potential to Achieve Goals: Good    Frequency Min 3X/week     Co-evaluation               AM-PAC PT "6 Clicks" Mobility  Outcome Measure Help needed turning from your back to your side while in a flat bed without using bedrails?: A Little Help needed moving from lying on your back to sitting on the side of a flat bed without using bedrails?: A Little Help needed moving to and from a bed to a chair (including a wheelchair)?: A Lot Help needed standing up from a chair using your arms (e.g., wheelchair or bedside chair)?: A Little Help needed to walk in hospital room?: A Lot Help needed climbing 3-5 steps with a railing? : A Lot 6 Click Score: 15    End of Session   Activity Tolerance: Patient tolerated treatment well;Patient limited by fatigue Patient left: in chair;with call bell/phone within reach Nurse Communication: Mobility status PT Visit Diagnosis: Unsteadiness on feet (R26.81);Other abnormalities of gait and mobility (R26.89);Muscle weakness (generalized) (M62.81)    Time: 9629-5284 PT Time Calculation (min) (ACUTE ONLY): 21 min   Charges:   PT Evaluation $PT Eval Moderate Complexity: 1 Mod  PT Treatments $Therapeutic Activity: 8-22 mins        4:03 PM, 06/22/22 Lonell Grandchild, MPT Physical Therapist with [facility] [phone] office [phone] mobile phone

## 2022-06-22 NOTE — Progress Notes (Addendum)
PROGRESS NOTE  Justin [name] DGL:[id number] DOB: 1945-10-31 DOA: 06/12/2022 PCP: Michell Heinrich, DO  Brief History:  76 year old male with a history of ESRD (MWF) atrial fibrillation/atrial flutter, hypertension, hyperlipidemia, prediabetes, tobacco abuse in remission presenting with 1 month history of buttock pain, right greater than left.  Patient stated that it started as a boil that subsequently burst.  After testing, he began noticing worsening pain in his perianal region with sitting.  It has progressively gotten worse.  He denies any fevers, chills, chest pain, shortness of breath, coughing, hemoptysis, nausea, vomiting, direct abdominal pain.  He has not been on any recent antibiotics.  Because of his worsening pain, the patient presented for further evaluation and treatment.  He last had dialysis on 06/12/2022.  In the ED, the patient had a low-grade temperature 100.2 F.  He was initially hypotensive with a blood pressure of 86/43.  WBC 17.2, hemoglobin 10.8, platelets 199,000.  Sodium 140, potassium 3.5, bicarbonate 32, serum creatinine 5.32.  CT of the abdomen and pelvis showed a multiloculated/fluid collection and gas collection in the perianal region from the perineum to the inferior buttocks bilaterally.  The patient was started on vancomycin and Zosyn.  General surgery was consulted to assist with management.  The patient's apixaban was held in preparation for surgery.   Assessment/Plan: Severe sepsis -Secondary to perirectal abscess -Presented with tachycardia, leukocytosis, and lactate 2.7 -06/14/22 arterial line inserted -06/14/22  CVL inserted by Dr. Arnoldo Morale, plan to DC 12/9 if no further pressor needed -Continue Zosyn for now - nephrology team started midodrine 10 mg TID to assist with weaning norepinephrine - weaned off norepinephrine 12/9; if holds and no further need for pressors, femoral line removed 12/9.   -pt remains off norepinephrine  Perirectal abscess -General  surgery consulted -restart apixaban now that central line removed -Echocardiogram EF 60-65%, low normal RVF, mod TR -I&D on 06/15/2022>>MDR E coli -surgery starting hydrogen peroxide irrigation treatments -remains on Zosyn with plan to transition to cefepime 12/11 with HD -discussed length of therapy with surgery and plan is to treat for 1 more week for total of 2 weeks from procedure   Wide Complex tachycardia -?Vtach -Wide complex tachcyardia with thready pulse, received 15 seconds of CPR. Another short 5 second episode while in OR -appreciate cardiology--discussed with Dr. Harl Bowie -on amiodarone 200 mg BID x 3 weeks followed by 200 mg daily per cardiology recs   Acute respiratory Failure with hypoxia -left intubated post-op -personally reviewed CXR 12/4--no edema or infiltrates -Dr. Melvyn Novas consulted -06/16/22--extubated -now on 3L [place] with 100% sat  Pleuritic Chest Pain - resolved  - from frequent deep coughing - pain is reproducible with light palpation the chest wall - scheduled acetaminophen for anti-inflammatory treatment, change to PRN dosing 12/10 - cough syrup as needed.    Hyperglycemia from sepsis  - BS coming down precipitously as the infection improves - DC basal insulin and prandial insulin coverage due to hypoglycemia CBG (last 3)  Recent Labs    06/21/22 2047 06/22/22 0756 06/22/22 1121  GLUCAP 111* 90 117*   Hypoglycemia from insulin - DC basal and prandial coverage   Headache / right facial pain - possibly tension headache - acetaminophen ordered - check CT head 12/11  - neuro exam is unremarkable   ESRD -He dialyzes on Monday Wednesday Friday -Nephrology consulted for next dialysis -Continue Velphoro and Sensipar -HD managed by nephrology service -next HD per nephrology team, planned for 12/11  Swollen left extremity - discussed with nephrologist Dr. Johnney Ou - they plan to see how he does during HD today to decide if inpt vascular testing needed vs  outpt   Atrial flutter -restarted apixaban 12/10 -treated with amiodarone gtt and on 12/7 transitioned to oral amiodarone -cardiology recommending amiodarone 200 mg BID x 3 weeks followed by 200 mg daily   Mixed hyperlipidemia -Continue statin   Essential hypertension -Holding metoprolol secondary to hypotension   Obesity -BMI 33.58 -lifestyle modification     Family Communication: no family at bedside, discussed plan of care with patient who verbalized understanding; telephone call to niece 06/21/22.    Consultants:  renal, general surgery, cardiology, PCCM   Code Status:  FULL    DVT Prophylaxis:  apixaban restarted 06/21/22     Procedures: As Listed in Progress Note Above   Antibiotics: Vanc 12/1>>12/6 Zosyn 12/1>>12/10 Cefepime in HD 12/11 >>  Subjective: Pt c/o left side facial pain and numbness, no vision changes, no rash, no blisters seen; tension in the left temple with headache;       Objective: Vitals:   06/22/22 1000 06/22/22 1100 06/22/22 1145 06/22/22 1200  BP: (!) 137/120 (!) 153/85  (!) 132/92  Pulse:      Resp: (!) '21 20  19  '$ Temp:   97.7 F (36.5 C)   TempSrc:   Axillary   SpO2:      Weight:      Height:       No intake or output data in the 24 hours ending 06/22/22 1413  Weight change:  Exam:  General:  Pt is awake, alert, follows commands appropriately, not in acute distress HEENT: No icterus, No thrush, No neck mass, [place]/AT Cardiovascular: normal S1/S2, no rubs, no gallops: reproducible anterior left side chest pain with palpation.  No signs of rash or blisters seen.  Respiratory: rare crackles. No wheeze Abdomen: Soft/+BS, non tender, non distended, no guarding Extremities: No edema, No lymphangitis, No petechiae, No rashes, no synovitis  Data Reviewed: I have personally reviewed following labs and imaging studies Basic Metabolic Panel: Recent Labs  Lab 06/16/22 0355 06/17/22 0401 06/18/22 0423 06/19/22 0410 06/20/22 0553  06/21/22 0342 06/22/22 0458  NA 133*   < > 133* 132* 133* 132* 135  K 4.7   < > 3.8 3.9 3.7 3.9 4.1  CL 94*   < > 94* 94* 95* 94* 97*  CO2 24   < > '26 26 26 26 25  '$ GLUCOSE 275*   < > 242* 209* 87 65* 97  BUN 43*   < > 52* 61* 39* 49* 58*  CREATININE 7.56*   < > 7.48* 9.05* 6.73* 8.68* 10.39*  CALCIUM 8.8*   < > 8.1* 8.1* 8.0* 8.6* 8.9  MG 1.9  --  1.9  --   --   --   --   PHOS  --    < > 4.5 4.8* 4.4 6.0* 6.5*   < > = values in this interval not displayed.   Liver Function Tests: Recent Labs  Lab 06/18/22 0423 06/19/22 0410 06/20/22 0553 06/21/22 0342 06/22/22 0458  ALBUMIN 2.3* 2.2* 2.2* 2.3* 2.4*   No results for input(s): "LIPASE", "AMYLASE" in the last 168 hours. No results for input(s): "AMMONIA" in the last 168 hours. Coagulation Profile: No results for input(s): "INR", "PROTIME" in the last 168 hours. CBC: Recent Labs  Lab 06/18/22 0423 06/19/22 0410 06/20/22 0553 06/21/22 0342 06/22/22 0458  WBC 17.5* 17.5* 16.7*  14.0* 15.0*  NEUTROABS 13.7* 13.3* 13.0* 10.8* 11.6*  HGB 9.8* 9.4* 8.9* 9.1* 9.3*  HCT 31.0* 29.9* 28.3* 28.7* 29.2*  MCV 96.0 96.8 96.6 97.0 96.7  PLT 338 318 351 342 404*   Cardiac Enzymes: No results for input(s): "CKTOTAL", "CKMB", "CKMBINDEX", "TROPONINI" in the last 168 hours. BNP: Invalid input(s): "POCBNP" CBG: Recent Labs  Lab 06/21/22 1152 06/21/22 1622 06/21/22 2047 06/22/22 0756 06/22/22 1121  GLUCAP 202* 137* 111* 90 117*   HbA1C: No results for input(s): "HGBA1C" in the last 72 hours.  Urine analysis:    Component Value Date/Time   COLORURINE AMBER (A) 05/27/2020 1451   APPEARANCEUR CLOUDY (A) 05/27/2020 1451   LABSPEC 1.017 05/27/2020 1451   PHURINE 6.0 05/27/2020 1451   GLUCOSEU 50 (A) 05/27/2020 1451   HGBUR LARGE (A) 05/27/2020 1451   BILIRUBINUR NEGATIVE 05/27/2020 1451   KETONESUR NEGATIVE 05/27/2020 1451   PROTEINUR 100 (A) 05/27/2020 1451   UROBILINOGEN 0.2 08/02/2010 1534   NITRITE NEGATIVE 05/27/2020  1451   LEUKOCYTESUR SMALL (A) 05/27/2020 1451   Recent Results (from the past 240 hour(s))  MRSA Next Gen by PCR, Nasal     Status: None   Collection Time: 06/13/22  1:30 AM   Specimen: Nasal Mucosa; Nasal Swab  Result Value Ref Range Status   MRSA by PCR Next Gen NOT DETECTED NOT DETECTED Final    Comment: (NOTE) The GeneXpert MRSA Assay (FDA approved for NASAL specimens only), is one component of a comprehensive MRSA colonization surveillance program. It is not intended to diagnose MRSA infection nor to guide or monitor treatment for MRSA infections. Test performance is not FDA approved in patients less than 2 years old. Performed at [facility], [address], [place], [place] [address]   Aerobic/Anaerobic Culture w Gram Stain (surgical/deep wound)     Status: None   Collection Time: 06/15/22  1:44 PM   Specimen: PATH Other; Tissue  Result Value Ref Range Status   Specimen Description ABSCESS  Final   Special Requests PERIRECTAL  Final   Gram Stain   Final    FEW WBC PRESENT, PREDOMINANTLY PMN RARE GRAM POSITIVE COCCI IN CLUSTERS RARE GRAM POSITIVE RODS    Culture   Final    FEW ESCHERICHIA COLI NO ANAEROBES ISOLATED Performed at [facility], [address] [place], [place] [address]    Report Status 06/20/2022 FINAL  Final   Organism ID, Bacteria ESCHERICHIA COLI  Final      Susceptibility   Escherichia coli - MIC*    AMPICILLIN >=32 RESISTANT Resistant     CEFAZOLIN >=64 RESISTANT Resistant     CEFEPIME <=0.12 SENSITIVE Sensitive     CEFTAZIDIME 4 SENSITIVE Sensitive     CEFTRIAXONE <=0.25 SENSITIVE Sensitive     CIPROFLOXACIN >=4 RESISTANT Resistant     GENTAMICIN <=1 SENSITIVE Sensitive     IMIPENEM <=0.25 SENSITIVE Sensitive     TRIMETH/SULFA >=320 RESISTANT Resistant     AMPICILLIN/SULBACTAM >=32 RESISTANT Resistant     PIP/TAZO <=4 SENSITIVE Sensitive     * FEW ESCHERICHIA COLI    Scheduled Meds:  acetaminophen  650 mg Oral Q4H   Or    acetaminophen  650 mg Rectal Q4H   amiodarone  200 mg Oral BID   Chlorhexidine Gluconate Cloth  6 each Topical Daily   insulin aspart  0-6 Units Subcutaneous TID AC & HS   midodrine  10 mg Oral TID WC   pantoprazole  40 mg Oral  QAC breakfast   polyethylene glycol  17 g Oral Daily   sodium chloride flush  10-40 mL Intracatheter Q12H   sucroferric oxyhydroxide  500 mg Oral BID WC   Continuous Infusions:  sodium chloride     ceFEPime (MAXIPIME) IV     Procedures/Studies: DG Chest Port 1 View  Result Date: 06/16/2022 CLINICAL DATA:  Acute respiratory failure with hypoxia and hypercapnia. Patient on vent. EXAM: PORTABLE CHEST 1 VIEW COMPARISON:  AP chest 06/15/2022 (multiple studies), chest two views 01/28/2020 FINDINGS: Endotracheal tube tip terminates approximately 6.3 cm above the carina, at the inferior aspect of the clavicular heads and similar to prior. Enteric tube descends below the diaphragm with the tip and side port overlying the left upper abdominal quadrant. Moderately decreased lung volumes. Right mid and lower lung horizontal linear densities, likely subsegmental atelectasis. No focal left lung airspace opacity. No definite pleural effusion. No pneumothorax. No acute skeletal abnormality. IMPRESSION: 1. Endotracheal tube tip terminates approximately 6.3 cm above the carina and similar to prior. 2. Enteric tube descends below the diaphragm with the tip and side port overlying the left upper abdominal quadrant. 3. Right mid and lower lung linear opacities are similar to prior, probable atelectasis. It is difficult to exclude mild pneumonia. Electronically Signed   By: Yvonne Kendall M.D.   On: 06/16/2022 09:02   DG CHEST PORT 1 VIEW  Result Date: 06/15/2022 CLINICAL DATA:  OG tube placement EXAM: PORTABLE CHEST 1 VIEW COMPARISON:  06/15/2022, 2:47 p.m. FINDINGS: Interval placement of esophagogastric tube, tip and side port below the diaphragm. Otherwise unchanged rotated AP portable  examination with endotracheal tube over the midtrachea, as well as bilateral perihilar heterogeneous and consolidative airspace opacities. No new airspace opacity. IMPRESSION: 1. Interval placement of esophagogastric tube, tip and side port below the diaphragm. 2. Otherwise unchanged rotated AP portable examination with endotracheal tube over the midtrachea, as well as bilateral perihilar heterogeneous and consolidative airspace opacities. Electronically Signed   By: Delanna Ahmadi M.D.   On: 06/15/2022 18:15   DG Chest Port 1 View  Result Date: 06/15/2022 CLINICAL DATA:  ETT placement EXAM: PORTABLE CHEST 1 VIEW COMPARISON:  Chest x-ray dated July 18th 2021 FINDINGS: ETT tip is proximally 4.5 cm from the carina. Cardiac and mediastinal contours within normal limits. Left perihilar opacities and right mid lung linear opacities, likely due to atelectasis. No pleural effusion or pneumothorax. IMPRESSION: Left perihilar opacities and right mid lung linear opacities, likely due to atelectasis. Electronically Signed   By: Yetta Glassman M.D.   On: 06/15/2022 15:01   ECHOCARDIOGRAM COMPLETE  Result Date: 06/13/2022    ECHOCARDIOGRAM REPORT   Patient Name:   Justin [name] Date of Exam: 06/13/2022 Medical Rec #:  [id number]      Height:       70.0 in Accession #:    [id number]     Weight:       234.0 lb Date of Birth:  1945-11-11      BSA:          2.231 m Patient Age:    30 years       BP:           98/51 mmHg Patient Gender: M              HR:           109 bpm. Exam Location:  Inpatient Procedure: 2D Echo, Color Doppler, Cardiac Doppler and Intracardiac  Opacification Agent Indications:    Atrial Fibrillation I48.91  History:        Patient has prior history of Echocardiogram examinations, most                 recent 01/30/2020. Aortic Valve Disease, Arrythmias:Atrial                 Fibrillation; Risk Factors:Hypertension and Dyslipidemia. End                 stage renal disease. Severe sepsis.   Sonographer:    Darlina Sicilian RDCS Referring Phys: [phone] [facility]  Sonographer Comments: Suboptimal apical window, suboptimal subcostal window, suboptimal parasternal window and Technically difficult study due to poor echo windows. IMPRESSIONS  1. Left ventricular ejection fraction, by estimation, is 60 to 65%. The left ventricle has normal function. Left ventricular diastolic parameters are indeterminate.  2. Right ventricular systolic function is low normal. The right ventricular size is mildly enlarged. There is moderately elevated pulmonary artery systolic pressure.  3. The mitral valve is grossly normal. No evidence of mitral valve regurgitation. No evidence of mitral stenosis.  4. Tricuspid valve regurgitation is moderate.  5. Poor acoustic windows limit study Assessment of AV also limited by atrial fibrillation     AV is thickened, calcified Peak and mean gradients through the valve are 39 and 21 mm Hg Dimensionless index is 0.33     Overall ocnsistent with moderate AS. COmpared to echo report from 2021, mean gradient is increaed. The aortic valve is calcified. There is mild calcification of the aortic valve. Aortic valve regurgitation is not visualized. Moderate aortic valve stenosis. FINDINGS  Left Ventricle: Left ventricular ejection fraction, by estimation, is 60 to 65%. The left ventricle has normal function. Definity contrast agent was given IV to delineate the left ventricular endocardial borders. The left ventricular internal cavity size was normal in size. There is no left ventricular hypertrophy. Left ventricular diastolic parameters are indeterminate. Right Ventricle: The right ventricular size is mildly enlarged. No increase in right ventricular wall thickness. Right ventricular systolic function is low normal. There is moderately elevated pulmonary artery systolic pressure. The tricuspid regurgitant  velocity is 3.30 m/s, and with an assumed right atrial pressure of 8 mmHg, the estimated  right ventricular systolic pressure is 78.2 mmHg. Left Atrium: Left atrial size was normal in size. Right Atrium: Right atrial size was normal in size. Pericardium: There is no evidence of pericardial effusion. Mitral Valve: The mitral valve is grossly normal. No evidence of mitral valve regurgitation. No evidence of mitral valve stenosis. Tricuspid Valve: The tricuspid valve is normal in structure. Tricuspid valve regurgitation is moderate . No evidence of tricuspid stenosis. Aortic Valve: Poor acoustic windows limit study Assessment of AV also limited by atrial fibrillation AV is thickened, calcified Peak and mean gradients through the valve are 39 and 21 mm Hg Dimensionless index is 0.33 Overall ocnsistent with moderate AS. COmpared to echo report from 2021, mean gradient is increaed. The aortic valve is calcified. There is mild calcification of the aortic valve. Aortic valve regurgitation is not visualized. Moderate aortic stenosis is present. Aortic valve mean gradient measures 28.0 mmHg. Aortic valve peak gradient measures 44.6 mmHg. Aortic valve area, by VTI measures 1.03 cm. Pulmonic Valve: The pulmonic valve was grossly normal. Pulmonic valve regurgitation is not visualized. No evidence of pulmonic stenosis. Aorta: The aortic root and ascending aorta are structurally normal, with no evidence of dilitation. IAS/Shunts: No atrial level shunt detected  by color flow Doppler.  LEFT VENTRICLE PLAX 2D LVIDd:         4.40 cm   Diastology LVIDs:         2.50 cm   LV e' medial:    10.55 cm/s LV PW:         0.90 cm   LV E/e' medial:  12.9 LV IVS:        0.90 cm   LV e' lateral:   13.05 cm/s LVOT diam:     2.00 cm   LV E/e' lateral: 10.5 LV SV:         65 LV SV Index:   29 LVOT Area:     3.14 cm  RIGHT VENTRICLE RV S prime:     13.20 cm/s TAPSE (M-mode): 2.2 cm LEFT ATRIUM           Index LA diam:      4.10 cm 1.84 cm/m LA Vol (A4C): 66.4 ml 29.76 ml/m  AORTIC VALVE AV Area (Vmax):    0.98 cm AV Area (Vmean):    1.03 cm AV Area (VTI):     1.03 cm AV Vmax:           334.00 cm/s AV Vmean:          254.000 cm/s AV VTI:            0.634 m AV Peak Grad:      44.6 mmHg AV Mean Grad:      28.0 mmHg LVOT Vmax:         104.00 cm/s LVOT Vmean:        83.500 cm/s LVOT VTI:          0.207 m LVOT/AV VTI ratio: 0.33  AORTA Ao Root diam: 3.40 cm Ao Asc diam:  3.70 cm MITRAL VALVE                TRICUSPID VALVE MV Area (PHT): 6.83 cm     TR Peak grad:   43.6 mmHg MV Decel Time: 111 msec     TR Vmax:        330.00 cm/s MV E velocity: 136.50 cm/s                             SHUNTS                             Systemic VTI:  0.21 m                             Systemic Diam: 2.00 cm Dorris Carnes MD Electronically signed by Dorris Carnes MD Signature Date/Time: 06/13/2022/2:38:06 PM    Final    CT PELVIS W CONTRAST  Result Date: 06/12/2022 CLINICAL DATA:  Left buttock abscess for 1 month EXAM: CT PELVIS WITH CONTRAST TECHNIQUE: Multidetector CT imaging of the pelvis was performed using the standard protocol following the bolus administration of intravenous contrast. RADIATION DOSE REDUCTION: This exam was performed according to the departmental dose-optimization program which includes automated exposure control, adjustment of the mA and/or kV according to patient size and/or use of iterative reconstruction technique. CONTRAST:  165m OMNIPAQUE IOHEXOL 300 MG/ML  SOLN COMPARISON:  05/05/2022, 05/27/2020 FINDINGS: Urinary Tract: Distal ureters are unremarkable. Bladder is decompressed, limiting its evaluation. Bowel: No bowel obstruction or ileus. Normal appendix right lower quadrant. No  bowel wall thickening or inflammatory change. Vascular/Lymphatic: Atherosclerosis of the aorta and its distal branches. No pathologically enlarged lymph nodes within the pelvis. Reproductive: Prostate is enlarged, measuring 5.5 x 4.3 cm. Fiduciary markers are seen within the prostate. Other: There is no free fluid or free intraperitoneal gas. Fat containing  umbilical hernia is noted. No bowel herniation. Musculoskeletal: Multilocular gas and fluid collections are seen within the buttocks, consistent with perianal abscesses. The largest gas and fluid collection within the right gluteal region measures 6.3 x 2.7 cm, reference image 50/2. This extends anteriorly into the perineum. More inferiorly within the buttocks are multilocular fluid collections, measuring up to 2.9 x 2.5 cm on the right and 3.8 x 2.5 cm on the left. I do not see any direct communication with the bowel lumen to suggest fistula. IMPRESSION: 1. Multilocular gas and fluid collection within the perianal region, extending from the perineum into the bilateral inferior buttocks, consistent with abscess. I do not see any communication between the gas and fluid collections and the bowel lumen. 2. Enlarged prostate. 3.  Aortic Atherosclerosis (ICD10-I70.0). Electronically Signed   By: Randa Ngo M.D.   On: 06/12/2022 20:01    Irwin Brakeman, MD  [facility]  If 7PM-7AM, please contact night-coverage [email or web address] Password [email or web address] 06/22/2022, 2:13 PM   LOS: 10 days

## 2022-06-23 DIAGNOSIS — N185 Chronic kidney disease, stage 5: Secondary | ICD-10-CM

## 2022-06-23 LAB — GLUCOSE, CAPILLARY
Glucose-Capillary: 168 mg/dL — ABNORMAL HIGH (ref 70–99)
Glucose-Capillary: 109 mg/dL — ABNORMAL HIGH (ref 70–99)
Glucose-Capillary: 210 mg/dL — ABNORMAL HIGH (ref 70–99)
Glucose-Capillary: 241 mg/dL — ABNORMAL HIGH (ref 70–99)
Glucose-Capillary: 186 mg/dL — ABNORMAL HIGH (ref 70–99)

## 2022-06-23 LAB — CBC
HCT: 28.9 % — ABNORMAL LOW (ref 39.0–52.0)
Hemoglobin: 9.3 g/dL — ABNORMAL LOW (ref 13.0–17.0)
MCH: 30.9 pg (ref 26.0–34.0)
MCHC: 32.2 g/dL (ref 30.0–36.0)
MCV: 96 fL (ref 80.0–100.0)
Platelets: 393 10*3/uL (ref 150–400)
RBC: 3.01 MIL/uL — ABNORMAL LOW (ref 4.22–5.81)
RDW: 15.6 % — ABNORMAL HIGH (ref 11.5–15.5)
WBC: 11.2 10*3/uL — ABNORMAL HIGH (ref 4.0–10.5)
nRBC: 0 % (ref 0.0–0.2)

## 2022-06-23 MED ORDER — SUCROFERRIC OXYHYDROXIDE 500 MG PO CHEW
500.0000 mg | CHEWABLE_TABLET | Freq: Three times a day (TID) | ORAL | Status: DC
  Administered 2022-06-24 – 2022-06-26 (×6): 500 mg via ORAL
  Filled 2022-06-23 (×17): qty 1

## 2022-06-23 NOTE — NC FL2 (Signed)
[place] MEDICAID FL2 LEVEL OF CARE FORM     IDENTIFICATION  Patient Name: Justin [name] Birthdate: 18-Dec-1945 Sex: male Admission Date (Current Location): 06/12/2022  [facility] and [place] Number:  [employer] and Address:  [facility] [address] [place]      Provider Number: [id number]  Attending Physician Name and Address:  Murlean Iba, MD  Relative Name and Phone Number:       Current Level of Care: Hospital Recommended Level of Care: [place] Prior Approval Number:    Date Approved/Denied:   PASRR Number:    Discharge Plan: SNF    Current Diagnoses: Patient Active Problem List   Diagnosis Date Noted   Wide-complex tachycardia 06/17/2022   Acute respiratory failure with hypoxia and hypercapnia (HCC) 06/15/2022   Hypotension 06/13/2022   Obesity (BMI 30-39.9) 06/13/2022   Perirectal abscess 06/13/2022   Perianal abscess 06/12/2022   Acute cough 05/12/2022   Acute URI 05/12/2022   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   Murmur, cardiac 01/27/2022   Chronic back pain 01/27/2022   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[place]) 01/27/2022   Hypercalcemia 11/14/2021   Bilateral lower extremity edema 11/11/2021   GERD (gastroesophageal reflux disease) 11/11/2021   High risk medication use 11/11/2021   History of cardioversion 11/11/2021   Paroxysmal atrial fibrillation with RVR ([place]) 11/11/2021   PVC (premature ventricular contraction) 11/11/2021   Nonrheumatic aortic valve stenosis 11/06/2021   Mixed hyperlipidemia 11/06/2021   Abdominal mass 05/05/2021   First degree AV block 07/13/2020   Septic shock ([place])    Atrial flutter ([place]) 01/29/2020   Abscess    Wound infection 01/28/2020   H/O vitrectomy 12/07/2019   Macular pucker, 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   Follow-up examination after eye surgery 10/24/2019   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 determined by  examination associated with type 2 diabetes mellitus ([place]) 10/24/2019   Stable treated proliferative diabetic retinopathy of left eye determined by examination associated with type 2 diabetes mellitus ([place]) 10/24/2019   Allergy, unspecified, initial encounter 10/10/2019   Abnormality of albumin 05/31/2019   Hyperkalemia 10/19/2018   Encounter for immunization 07/30/2017   ESRD on dialysis ([place]) 07/29/2017   Anticoagulated by anticoagulation treatment 07/13/2017   Malignant neoplasm of prostate ([place]) 07/12/2017   Secondary hyperparathyroidism of renal origin ([place]) 01/21/2017   Dependence on renal dialysis ([place]) 01/11/2017   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, with stage 1 through stage 4 chronic kidney disease, or unspecified chronic kidney disease 01/11/2017   Coagulation defect, unspecified ([place]) 01/08/2017   Iron deficiency anemia, unspecified 01/08/2017   Idiopathic gout 01/08/2017   Anemia in chronic kidney disease 11/20/2015   Blurred vision, left eye 07/10/2011   Essential hypertension 07/10/2011   DM (diabetes mellitus) ([place]) 07/10/2011   CKD (chronic kidney disease) 07/10/2011    Orientation RESPIRATION BLADDER Height & Weight     Self, Time, Situation, Place  Normal Continent Weight: 244 lb 11.4 oz (111 kg) Height:  '5\' 10"'$  (177.8 cm)  BEHAVIORAL SYMPTOMS/MOOD NEUROLOGICAL BOWEL NUTRITION STATUS      Continent Diet (see dc summary)  AMBULATORY STATUS COMMUNICATION OF NEEDS Skin   Extensive Assist Verbally Normal                       Personal Care Assistance Level of Assistance  Bathing, Feeding, Dressing Bathing Assistance: Limited assistance Feeding assistance: Independent  Dressing Assistance: Limited assistance     Functional Limitations Info  Sight, Hearing, Speech Sight Info: Adequate Hearing Info: Adequate Speech Info: Adequate    SPECIAL CARE FACTORS FREQUENCY  PT (By licensed PT), OT (By licensed OT)     PT  Frequency: 5x week OT Frequency: 3x week            Contractures Contractures Info: Not present    Additional Factors Info  Code Status, Allergies Code Status Info: Full Allergies Info: NKA           Current Medications (06/23/2022):  This is the current hospital active medication list Current Facility-Administered Medications  Medication Dose Route Frequency Provider Last Rate Last Admin   0.9 %  sodium chloride infusion  250 mL Intravenous Continuous Aviva Signs, MD   Continued from Pre-op at 06/15/22 1306   acetaminophen (TYLENOL) tablet 650 mg  650 mg Oral Q4H Johnson, Clanford L, MD   650 mg at 06/23/22 1226   Or   acetaminophen (TYLENOL) suppository 650 mg  650 mg Rectal Q4H Johnson, Clanford L, MD       amiodarone (PACERONE) tablet 200 mg  200 mg Oral BID Johnson, Clanford L, MD   200 mg at 06/23/22 0845   ceFEPIme (MAXIPIME) 2 g in sodium chloride 0.9 % 100 mL IVPB  2 g Intravenous Q M,W,F-HD Johnson, Clanford L, MD 200 mL/hr at 06/22/22 2109 2 g at 06/22/22 2109   Chlorhexidine Gluconate Cloth 2 % PADS 6 each  6 each Topical Daily Aviva Signs, MD   6 each at 06/22/22 0807   dextromethorphan (DELSYM) 30 MG/5ML liquid 30 mg  30 mg Oral BID PRN Wynetta Emery, Clanford L, MD   30 mg at 06/19/22 2053   HYDROcodone-acetaminophen (NORCO/VICODIN) 5-325 MG per tablet 1 tablet  1 tablet Oral Q6H PRN Aviva Signs, MD   1 tablet at 06/22/22 1159   insulin aspart (novoLOG) injection 0-6 Units  0-6 Units Subcutaneous TID AC & HS Zierle-Ghosh, Asia B, DO   2 Units at 06/23/22 1226   ipratropium-albuterol (DUONEB) 0.5-2.5 (3) MG/3ML nebulizer solution 3 mL  3 mL Nebulization Q4H PRN Johnson, Clanford L, MD   3 mL at 06/18/22 2347   lidocaine (PF) (XYLOCAINE) 1 % injection 5 mL  5 mL Intradermal PRN Aviva Signs, MD       lidocaine-prilocaine (EMLA) cream 1 Application  1 Application Topical PRN Aviva Signs, MD       menthol-cetylpyridinium (CEPACOL) lozenge 3 mg  1 lozenge Oral PRN Tat,  David, MD   3 mg at 06/16/22 1556   ondansetron (ZOFRAN) tablet 4 mg  4 mg Oral Q6H PRN Aviva Signs, MD       Or   ondansetron [facility]) injection 4 mg  4 mg Intravenous Q6H PRN Aviva Signs, MD       Oral care mouth rinse  15 mL Mouth Rinse PRN Johnson, Clanford L, MD       pantoprazole (PROTONIX) EC tablet 40 mg  40 mg Oral QAC breakfast Tanda Rockers, MD   40 mg at 06/23/22 0845   pentafluoroprop-tetrafluoroeth (GEBAUERS) aerosol 1 Application  1 Application Topical PRN Aviva Signs, MD       polyethylene glycol (MIRALAX / GLYCOLAX) packet 17 g  17 g Oral Daily Johnson, Clanford L, MD   17 g at 06/23/22 0846   sodium chloride flush (NS) 0.9 % injection 10-40 mL  10-40 mL Intracatheter Q12H Aviva Signs, MD   10 mL  at 06/23/22 0847   sucroferric oxyhydroxide (VELPHORO) chewable tablet 500 mg  500 mg Oral BID WC Aviva Signs, MD   500 mg at 06/21/22 0831     Discharge Medications: Please see discharge summary for a list of discharge medications.  Relevant Imaging Results:  Relevant Lab Results:   Additional Information SSN: [id number]  Outpatient HD MWF [facility] [address] [place], [place]

## 2022-06-23 NOTE — TOC Progression Note (Signed)
Transition of Care [facility]) - Progression Note    Patient Details  Name: Justin [name] MRN: [id number] Date of Birth: [birth year]  [facility]) CM/SW Contact  Shade Flood, LCSW Phone Number: 06/23/2022, 2:08 PM  Clinical Narrative:     TOC following. PT recommended SNF rehab. Spoke with pt to review. Pt agreeable to SNF rehab. CMS provider options reviewed and referred as requested. Insurance Josem Kaufmann will be started. Anticipating dc tomorrow after HD if insurance approves.  Will follow.  Expected Discharge Plan: Skilled Nursing Facility Barriers to Discharge: Ship broker, SNF Pending bed offer  Expected Discharge Plan and Services Expected Discharge Plan: [place] In-house Referral: Clinical Social Work   Post Acute Care Choice: [place] Living arrangements for the past 2 months: [place] Determinants of Health (SDOH) Interventions    Readmission Risk Interventions    06/17/2022    2:47 PM  Readmission Risk Prevention Plan  Transportation Screening Complete  HRI or [place] Complete  Social Work Consult for [place] Planning/Counseling Complete  Palliative Care Screening Not Applicable  Medication Review [employer]) Complete

## 2022-06-23 NOTE — Consult Note (Signed)
[facility]ist of [place]  Patient name: Justin [name] MRN: [id number] DOB: 14-Jun-1946 Sex: male   HPI: Justin [name] is a 76 y.o. male seen today for evaluation of left arm access.  He was admitted to [facility] for perirectal abscess.  He has a long history of hemodialysis and has dialysis in [place].  He had initial access placement at [facility] with a left brachiocephalic fistula in November 2016.  He reports that he did not have hemodialysis for an additional 2 years.  I do not have any official documentation of this.  He did undergo angioplasty of an fistula and central stenosis at [facility] on 08/17/2017.  He does not recall if he has had any other interventions.  He also reports over the past several weeks that he has noted increased swelling in his left arm.  He reports no difficulty with dialysis access recently at his [facility].  He was noted to have some elevated venous pressure with hemodialysis yesterday.  Past Medical History:  Diagnosis Date   Anemia    Anemia in chronic kidney disease 11/20/2015   Cancer ([place]) 2009   Prostate; radiation seeds   Chronic kidney disease    stage 4   Diabetes mellitus    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 ([place]) 12/07/2019   Dialysis patient [facility])    Gout    Hypertension    Right posterior capsular opacification 02/27/2021   Vitreous hemorrhage of right eye ([place]) 10/24/2019    Family History  Problem Relation Age of Onset   Kidney disease Mother    Alcoholism Father    Alcoholism Brother     SOCIAL HISTORY: Social History   Tobacco Use   Smoking status: Former    Types: Cigarettes    Quit date: 10/01/1978    Years since quitting: 43.7   Smokeless tobacco: Never  Substance Use Topics   Alcohol use: No    No Known Allergies  Current Facility-Administered  Medications  Medication Dose Route Frequency Provider Last Rate Last Admin   0.9 %  sodium chloride infusion  250 mL Intravenous Continuous Aviva Signs, MD   Continued from Pre-op at 06/15/22 1306   acetaminophen (TYLENOL) tablet 650 mg  650 mg Oral Q4H Johnson, Clanford L, MD   650 mg at 06/23/22 1226   Or   acetaminophen (TYLENOL) suppository 650 mg  650 mg Rectal Q4H Johnson, Clanford L, MD       amiodarone (PACERONE) tablet 200 mg  200 mg Oral BID Johnson, Clanford L, MD   200 mg at 06/23/22 0845   ceFEPIme (MAXIPIME) 2 g in sodium chloride 0.9 % 100 mL IVPB  2 g Intravenous Q M,W,F-HD Johnson, Clanford L, MD 200 mL/hr at 06/22/22 2109 2 g at 06/22/22 2109   Chlorhexidine Gluconate Cloth 2 % PADS 6 each  6 each Topical Daily Aviva Signs, MD   6 each at 06/22/22 0807   dextromethorphan (DELSYM) 30 MG/5ML liquid 30 mg  30 mg Oral BID PRN Irwin Brakeman L, MD   30 mg at 06/19/22 2053   HYDROcodone-acetaminophen (NORCO/VICODIN) 5-325 MG per tablet 1 tablet  1 tablet Oral Q6H PRN Aviva Signs, MD  1 tablet at 06/22/22 1159   insulin aspart (novoLOG) injection 0-6 Units  0-6 Units Subcutaneous TID AC & HS Zierle-Ghosh, Asia B, DO   2 Units at 06/23/22 1226   ipratropium-albuterol (DUONEB) 0.5-2.5 (3) MG/3ML nebulizer solution 3 mL  3 mL Nebulization Q4H PRN Johnson, Clanford L, MD   3 mL at 06/18/22 2347   lidocaine (PF) (XYLOCAINE) 1 % injection 5 mL  5 mL Intradermal PRN Aviva Signs, MD       lidocaine-prilocaine (EMLA) cream 1 Application  1 Application Topical PRN Aviva Signs, MD       menthol-cetylpyridinium (CEPACOL) lozenge 3 mg  1 lozenge Oral PRN Orson Eva, MD   3 mg at 06/16/22 1556   ondansetron (ZOFRAN) tablet 4 mg  4 mg Oral Q6H PRN Aviva Signs, MD       Or   ondansetron [facility]) injection 4 mg  4 mg Intravenous Q6H PRN Aviva Signs, MD       Oral care mouth rinse  15 mL Mouth Rinse PRN Johnson, Clanford L, MD       pantoprazole (PROTONIX) EC tablet 40 mg  40 mg Oral  QAC breakfast Tanda Rockers, MD   40 mg at 06/23/22 0845   pentafluoroprop-tetrafluoroeth (GEBAUERS) aerosol 1 Application  1 Application Topical PRN Aviva Signs, MD       polyethylene glycol (MIRALAX / GLYCOLAX) packet 17 g  17 g Oral Daily Johnson, Clanford L, MD   17 g at 06/23/22 0846   sodium chloride flush (NS) 0.9 % injection 10-40 mL  10-40 mL Intracatheter Q12H Aviva Signs, MD   10 mL at 06/23/22 0847   sucroferric oxyhydroxide (VELPHORO) chewable tablet 500 mg  500 mg Oral BID WC Aviva Signs, MD   500 mg at 06/21/22 0831    REVIEW OF SYSTEMS:  '[X]'$  denotes positive finding, '[ ]'$  denotes negative finding Cardiac  Comments:  Chest pain or chest pressure:    Shortness of breath upon exertion:    Short of breath when lying flat:    Irregular heart rhythm:        Vascular    Pain in calf, thigh, or hip brought on by ambulation:    Pain in feet at night that wakes you up from your sleep:     Blood clot in your veins:    Leg swelling:           PHYSICAL EXAM: Vitals:   06/22/22 2229 06/23/22 0333 06/23/22 0500 06/23/22 0839  BP: 129/75 128/75  (!) 155/91  Pulse: (!) 102 (!) 101  (!) 103  Resp: '18 18  16  '$ Temp: (!) 97.2 F (36.2 C) (!) 97.2 F (36.2 C)  97.7 F (36.5 C)  TempSrc:    Oral  SpO2: 97% 93%  100%  Weight:   111 kg   Height:        GENERAL: The patient is a well-nourished male, in no acute distress. The vital signs are documented above. CARDIOVASCULAR: Developed left upper arm AV fistula.  Dressings were removed.  No evidence of erosion of the skin or aneurysmal degeneration.  He does have a pulsatile nature of the flow in his fistula PULMONARY: There is good air exchange  MUSCULOSKELETAL: There are no major deformities or cyanosis. NEUROLOGIC: No focal weakness or paresthesias are detected. SKIN: There are no ulcers or rashes noted. PSYCHIATRIC: The patient has a normal affect.  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  I explained the neck step would be fistulogram and possible reintervention with balloon angioplasty.  The patient reports that this is done by his nephrologist at the outpatient dialysis center in [place] and he would prefer to have this done there versus [facility].  He is having adequate dialysis currently and I feel that it would be appropriate to defer this until after discharge.  We will see him again on an as-needed basis    Rosetta Posner, MD FACS [facility] Tel [phone]  Note: Portions of this report may have been transcribed using voice recognition software.  Every effort has been made to ensure accuracy; however, inadvertent computerized transcription errors may still be present.

## 2022-06-23 NOTE — Progress Notes (Signed)
Patient ID: Justin [name], male   DOB: 02-Mar-1946, 76 y.o.   MRN: [id number] S: Feels well this morning.  Had issues with BFR and elevated venous pressures of AVF during HD yesterday, however bp remained stable. O:BP (!) 155/91 (BP Location: Right Arm)   Pulse (!) 103   Temp 97.7 F (36.5 C) (Oral)   Resp 16   Ht '5\' 10"'$  (1.778 m)   Wt 111 kg   SpO2 100%   BMI 35.11 kg/m   Intake/Output Summary (Last 24 hours) at 06/23/2022 1004 Last data filed at 06/23/2022 0606 Gross per 24 hour  Intake 240 ml  Output 2800 ml  Net -2560 ml   Intake/Output: I/O last 3 completed shifts: In: 240 [P.O.:240] Out: 2800 [Other:2800]  Intake/Output this shift:  No intake/output data recorded. Weight change:  Gen:NAD CVS: IRR IRR Resp: CTA Abd: +BS, soft, NT/ND Ext: 1+ edema of LUE, LUE AVF +T/B  Recent Labs  Lab 06/17/22 0401 06/18/22 0423 06/19/22 0410 06/20/22 0553 06/21/22 0342 06/22/22 0458  NA 135 133* 132* 133* 132* 135  K 4.7 3.8 3.9 3.7 3.9 4.1  CL 93* 94* 94* 95* 94* 97*  CO2 '25 26 26 26 26 25  '$ GLUCOSE 306* 242* 209* 87 65* 97  BUN 68* 52* 61* 39* 49* 58*  CREATININE 9.37* 7.48* 9.05* 6.73* 8.68* 10.39*  ALBUMIN 2.2* 2.3* 2.2* 2.2* 2.3* 2.4*  CALCIUM 8.3* 8.1* 8.1* 8.0* 8.6* 8.9  PHOS 6.4* 4.5 4.8* 4.4 6.0* 6.5*   Liver Function Tests: Recent Labs  Lab 06/20/22 0553 06/21/22 0342 06/22/22 0458  ALBUMIN 2.2* 2.3* 2.4*   No results for input(s): "LIPASE", "AMYLASE" in the last 168 hours. No results for input(s): "AMMONIA" in the last 168 hours. CBC: Recent Labs  Lab 06/19/22 0410 06/20/22 0553 06/21/22 0342 06/22/22 0458 06/23/22 0324  WBC 17.5* 16.7* 14.0* 15.0* 11.2*  NEUTROABS 13.3* 13.0* 10.8* 11.6*  --   HGB 9.4* 8.9* 9.1* 9.3* 9.3*  HCT 29.9* 28.3* 28.7* 29.2* 28.9*  MCV 96.8 96.6 97.0 96.7 96.0  PLT 318 351 342 404* 393   Cardiac Enzymes: No results for input(s): "CKTOTAL", "CKMB", "CKMBINDEX", "TROPONINI" in the last 168 hours. CBG: Recent Labs   Lab 06/22/22 1121 06/22/22 1646 06/22/22 2231 06/23/22 0334 06/23/22 0745  GLUCAP 117* 117* 106* 168* 109*    Iron Studies: No results for input(s): "IRON", "TIBC", "TRANSFERRIN", "FERRITIN" in the last 72 hours. Studies/Results: CT HEAD WO CONTRAST (5MM)  Result Date: 06/22/2022 CLINICAL DATA:  Stroke suspected. Headache. Right-sided facial pain. EXAM: CT HEAD WITHOUT CONTRAST TECHNIQUE: Contiguous axial images were obtained from the base of the skull through the vertex without intravenous contrast. RADIATION DOSE REDUCTION: This exam was performed according to the departmental dose-optimization program which includes automated exposure control, adjustment of the mA and/or kV according to patient size and/or use of iterative reconstruction technique. COMPARISON:  CT Brain 04/19/21 FINDINGS: Brain: No evidence of acute infarction, hemorrhage, hydrocephalus, extra-axial collection or mass lesion/mass effect. Sequela of mild chronic microvascular ischemic change. Vascular: No hyperdense vessel or unexpected calcification. Skull: Normal. Negative for fracture or focal lesion. Sinuses/Orbits: Bilateral lens replacement. Paranasal sinuses and mastoid air cells are clear. Other: Nonspecific soft tissue thickening along the midline suboccipital scalp, similar to slightly more conspicuous (series 100, image 28). Correlate with physical exam. IMPRESSION: 1. No acute intracranial abnormality. 2. Nonspecific soft tissue thickening along the midline suboccipital scalp. Correlate with physical exam. Electronically Signed   By: Marin Olp.D.  On: 06/22/2022 14:33    acetaminophen  650 mg Oral Q4H   Or   acetaminophen  650 mg Rectal Q4H   amiodarone  200 mg Oral BID   Chlorhexidine Gluconate Cloth  6 each Topical Daily   insulin aspart  0-6 Units Subcutaneous TID AC & HS   pantoprazole  40 mg Oral QAC breakfast   polyethylene glycol  17 g Oral Daily   sodium chloride flush  10-40 mL Intracatheter Q12H    sucroferric oxyhydroxide  500 mg Oral BID WC    BMET    Component Value Date/Time   NA 135 06/22/2022 0458   K 4.1 06/22/2022 0458   CL 97 (L) 06/22/2022 0458   CO2 25 06/22/2022 0458   GLUCOSE 97 06/22/2022 0458   BUN 58 (H) 06/22/2022 0458   CREATININE 10.39 (H) 06/22/2022 0458   CALCIUM 8.9 06/22/2022 0458   GFRNONAA 5 (L) 06/22/2022 0458   GFRAA 8 (L) 02/01/2020 0424   CBC    Component Value Date/Time   WBC 11.2 (H) 06/23/2022 0324   RBC 3.01 (L) 06/23/2022 0324   HGB 9.3 (L) 06/23/2022 0324   HCT 28.9 (L) 06/23/2022 0324   PLT 393 06/23/2022 0324   MCV 96.0 06/23/2022 0324   MCH 30.9 06/23/2022 0324   MCHC 32.2 06/23/2022 0324   RDW 15.6 (H) 06/23/2022 0324   LYMPHSABS 1.5 06/22/2022 0458   MONOABS 1.3 (H) 06/22/2022 0458   EOSABS 0.2 06/22/2022 0458   BASOSABS 0.1 06/22/2022 0458    Assessment/Plan: 1. Severe sepsis/ perirectal abscess: s/p I and D 12/4 with gen surg.  Vanc/ Zosyn.  Off pressors now.  Remains on midodrine. Bp's have improved.   2. ESRD:  [place].  MWF, using AVF.  HD on schedule 06/19/22, next 12/10. Issues with access BFR not > 256m/min and LUE edema.  Have d/w HD RN - will see how AVF performs with HD today and make decision if needs angiogram while in vs outpt.  Poor BFR and elevated venous pressures with HD yesterday.  Discussed with Dr. EDonnetta Hutchingwho will see him later today.  UF limited by hypotension during hospitalization, however able to UF 2.8 L yesterday with midodrine.  Plan for HD again tomorrow.   3. 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 Nutrition: renal diet with fluid restriction   6. S/p cardiac arrest- brief, in OR, ROSC achieved, Vtach   7.  Afib: on amio and OP AC  JDonetta Potts MD [facility]

## 2022-06-23 NOTE — Progress Notes (Signed)
PROGRESS NOTE  Justin [name] BJY:[id number] DOB: 09/12/1945 DOA: 06/12/2022 PCP: Michell Heinrich, DO  Brief History:  76 year old male with a history of ESRD (MWF) atrial fibrillation/atrial flutter, hypertension, hyperlipidemia, prediabetes, tobacco abuse in remission presenting with 1 month history of buttock pain, right greater than left.  Patient stated that it started as a boil that subsequently burst.  After testing, he began noticing worsening pain in his perianal region with sitting.  It has progressively gotten worse.  He denies any fevers, chills, chest pain, shortness of breath, coughing, hemoptysis, nausea, vomiting, direct abdominal pain.  He has not been on any recent antibiotics.  Because of his worsening pain, the patient presented for further evaluation and treatment.  He last had dialysis on 06/12/2022.  In the ED, the patient had a low-grade temperature 100.2 F.  He was initially hypotensive with a blood pressure of 86/43.  WBC 17.2, hemoglobin 10.8, platelets 199,000.  Sodium 140, potassium 3.5, bicarbonate 32, serum creatinine 5.32.  CT of the abdomen and pelvis showed a multiloculated/fluid collection and gas collection in the perianal region from the perineum to the inferior buttocks bilaterally.  The patient was started on vancomycin and Zosyn.  General surgery was consulted to assist with management.  The patient's apixaban was held in preparation for surgery.   Assessment/Plan: Severe sepsis - resolved  -Secondary to perirectal abscess -Presented with tachycardia, leukocytosis, and lactate 2.7 -06/14/22 arterial line inserted -06/14/22  CVL inserted by Dr. Arnoldo Morale, plan to DC 12/9 if no further pressor needed -Continue Zosyn for now - nephrology team started midodrine 10 mg TID to assist with weaning norepinephrine - weaned off norepinephrine 12/9; if holds and no further need for pressors, femoral line removed 12/9.   - pt remains off norepinephrine and now off  midodrine as of 12/12  Perirectal abscess -General surgery consulted -restart apixaban now that central line removed -Echocardiogram EF 60-65%, low normal RVF, mod TR -I&D on 06/15/2022>>MDR E coli -surgery starting hydrogen peroxide irrigation treatments -remains on Zosyn with plan to transition to cefepime 12/11 with HD -discussed length of therapy with surgery and plan is to treat for 1 more week for total of 2 weeks from procedure with cefepime with dialysis    Wide Complex tachycardia -?Vtach -Wide complex tachcyardia with thready pulse, received 15 seconds of CPR. Another short 5 second episode while in OR -appreciate cardiology--discussed with Dr. Harl Bowie -on amiodarone 200 mg BID x 3 weeks followed by 200 mg daily per cardiology recs   Acute respiratory Failure with hypoxia -left intubated post-op -personally reviewed CXR 12/4--no edema or infiltrates -Dr. Melvyn Novas consulted -06/16/22--extubated -now on 3L [place] with 100% sat, wean oxygen as able   Pleuritic Chest Pain - resolved  - from frequent deep coughing - pain is reproducible with light palpation the chest wall - scheduled acetaminophen for anti-inflammatory treatment, change to PRN dosing 12/10 - cough syrup as needed.    Hyperglycemia from sepsis  - BS coming down precipitously as the infection improves - DC basal insulin and prandial insulin coverage due to hypoglycemia CBG (last 3)  Recent Labs    06/23/22 0334 06/23/22 0745 06/23/22 1151  GLUCAP 168* 109* 210*   Hypoglycemia from insulin - DC basal and prandial coverage   Headache / right facial pain - possibly tension headache - acetaminophen ordered - check CT head 12/11  - neuro exam is unremarkable   ESRD -He dialyzes on Monday Wednesday Friday -Nephrology consulted  for next dialysis -Continue Velphoro and Sensipar -HD managed by nephrology service -next HD per nephrology team, planned for 12/11  Swollen left extremity - discussed with  nephrologist Dr. Johnney Ou - they plan to see how he does during HD today to decide if inpt vascular testing needed vs outpt   Atrial flutter -restarted apixaban 12/10 -treated with amiodarone gtt and on 12/7 transitioned to oral amiodarone -cardiology recommending amiodarone 200 mg BID x 3 weeks followed by 200 mg daily   Mixed hyperlipidemia -Continue statin   Essential hypertension -Holding metoprolol secondary to hypotension   Obesity -BMI 33.58 -lifestyle modification     Family Communication: no family at bedside, discussed plan of care with patient who verbalized understanding; telephone call to niece 06/22/22 per pt request.     Consultants:  renal, general surgery, cardiology, PCCM   Code Status:  FULL    DVT Prophylaxis:  apixaban restarted 06/21/22     Procedures: As Listed in Progress Note Above   Antibiotics: Vanc 12/1>>12/6 Zosyn 12/1>>12/10 Cefepime in HD 12/11 >>  Subjective: Pt denies any further left sided facial pain.         Objective: Vitals:   06/22/22 2229 06/23/22 0333 06/23/22 0500 06/23/22 0839  BP: 129/75 128/75  (!) 155/91  Pulse: (!) 102 (!) 101  (!) 103  Resp: '18 18  16  '$ Temp: (!) 97.2 F (36.2 C) (!) 97.2 F (36.2 C)  97.7 F (36.5 C)  TempSrc:    Oral  SpO2: 97% 93%  100%  Weight:   111 kg   Height:        Intake/Output Summary (Last 24 hours) at 06/23/2022 1302 Last data filed at 06/23/2022 1100 Gross per 24 hour  Intake 480 ml  Output 2800 ml  Net -2320 ml    Weight change:  Exam:  General:  Pt is awake, alert, follows commands appropriately, not in acute distress HEENT: No icterus, No thrush, No neck mass, [place]/AT Cardiovascular: normal S1/S2, no rubs, no gallops: reproducible anterior left side chest pain with palpation.  No signs of rash or blisters seen.  Respiratory: rare crackles. No wheeze Abdomen: Soft/+BS, non tender, non distended, no guarding Extremities: No edema, No lymphangitis, No petechiae, No rashes,  no synovitis  Data Reviewed: I have personally reviewed following labs and imaging studies Basic Metabolic Panel: Recent Labs  Lab 06/18/22 0423 06/19/22 0410 06/20/22 0553 06/21/22 0342 06/22/22 0458  NA 133* 132* 133* 132* 135  K 3.8 3.9 3.7 3.9 4.1  CL 94* 94* 95* 94* 97*  CO2 '26 26 26 26 25  '$ GLUCOSE 242* 209* 87 65* 97  BUN 52* 61* 39* 49* 58*  CREATININE 7.48* 9.05* 6.73* 8.68* 10.39*  CALCIUM 8.1* 8.1* 8.0* 8.6* 8.9  MG 1.9  --   --   --   --   PHOS 4.5 4.8* 4.4 6.0* 6.5*   Liver Function Tests: Recent Labs  Lab 06/18/22 0423 06/19/22 0410 06/20/22 0553 06/21/22 0342 06/22/22 0458  ALBUMIN 2.3* 2.2* 2.2* 2.3* 2.4*   No results for input(s): "LIPASE", "AMYLASE" in the last 168 hours. No results for input(s): "AMMONIA" in the last 168 hours. Coagulation Profile: No results for input(s): "INR", "PROTIME" in the last 168 hours. CBC: Recent Labs  Lab 06/18/22 0423 06/19/22 0410 06/20/22 0553 06/21/22 0342 06/22/22 0458 06/23/22 0324  WBC 17.5* 17.5* 16.7* 14.0* 15.0* 11.2*  NEUTROABS 13.7* 13.3* 13.0* 10.8* 11.6*  --   HGB 9.8* 9.4* 8.9* 9.1* 9.3* 9.3*  HCT 31.0* 29.9* 28.3* 28.7* 29.2* 28.9*  MCV 96.0 96.8 96.6 97.0 96.7 96.0  PLT 338 318 351 342 404* 393   Cardiac Enzymes: No results for input(s): "CKTOTAL", "CKMB", "CKMBINDEX", "TROPONINI" in the last 168 hours. BNP: Invalid input(s): "POCBNP" CBG: Recent Labs  Lab 06/22/22 1646 06/22/22 2231 06/23/22 0334 06/23/22 0745 06/23/22 1151  GLUCAP 117* 106* 168* 109* 210*   HbA1C: No results for input(s): "HGBA1C" in the last 72 hours.  Urine analysis:    Component Value Date/Time   COLORURINE AMBER (A) 05/27/2020 1451   APPEARANCEUR CLOUDY (A) 05/27/2020 1451   LABSPEC 1.017 05/27/2020 1451   PHURINE 6.0 05/27/2020 1451   GLUCOSEU 50 (A) 05/27/2020 1451   HGBUR LARGE (A) 05/27/2020 1451   BILIRUBINUR NEGATIVE 05/27/2020 1451   KETONESUR NEGATIVE 05/27/2020 1451   PROTEINUR 100 (A)  05/27/2020 1451   UROBILINOGEN 0.2 08/02/2010 1534   NITRITE NEGATIVE 05/27/2020 1451   LEUKOCYTESUR SMALL (A) 05/27/2020 1451   Recent Results (from the past 240 hour(s))  Aerobic/Anaerobic Culture w Gram Stain (surgical/deep wound)     Status: None   Collection Time: 06/15/22  1:44 PM   Specimen: PATH Other; Tissue  Result Value Ref Range Status   Specimen Description ABSCESS  Final   Special Requests PERIRECTAL  Final   Gram Stain   Final    FEW WBC PRESENT, PREDOMINANTLY PMN RARE GRAM POSITIVE COCCI IN CLUSTERS RARE GRAM POSITIVE RODS    Culture   Final    FEW ESCHERICHIA COLI NO ANAEROBES ISOLATED Performed at [facility], [place] [address], [place], [place] [address]    Report Status 06/20/2022 FINAL  Final   Organism ID, Bacteria ESCHERICHIA COLI  Final      Susceptibility   Escherichia coli - MIC*    AMPICILLIN >=32 RESISTANT Resistant     CEFAZOLIN >=64 RESISTANT Resistant     CEFEPIME <=0.12 SENSITIVE Sensitive     CEFTAZIDIME 4 SENSITIVE Sensitive     CEFTRIAXONE <=0.25 SENSITIVE Sensitive     CIPROFLOXACIN >=4 RESISTANT Resistant     GENTAMICIN <=1 SENSITIVE Sensitive     IMIPENEM <=0.25 SENSITIVE Sensitive     TRIMETH/SULFA >=320 RESISTANT Resistant     AMPICILLIN/SULBACTAM >=32 RESISTANT Resistant     PIP/TAZO <=4 SENSITIVE Sensitive     * FEW ESCHERICHIA COLI    Scheduled Meds:  acetaminophen  650 mg Oral Q4H   Or   acetaminophen  650 mg Rectal Q4H   amiodarone  200 mg Oral BID   Chlorhexidine Gluconate Cloth  6 each Topical Daily   insulin aspart  0-6 Units Subcutaneous TID AC & HS   pantoprazole  40 mg Oral QAC breakfast   polyethylene glycol  17 g Oral Daily   sodium chloride flush  10-40 mL Intracatheter Q12H   sucroferric oxyhydroxide  500 mg Oral BID WC   Continuous Infusions:  sodium chloride     ceFEPime (MAXIPIME) IV 2 g (06/22/22 2109)   Procedures/Studies: CT HEAD WO CONTRAST (5MM)  Result Date: 06/22/2022 CLINICAL DATA:  Stroke  suspected. Headache. Right-sided facial pain. EXAM: CT HEAD WITHOUT CONTRAST TECHNIQUE: Contiguous axial images were obtained from the base of the skull through the vertex without intravenous contrast. RADIATION DOSE REDUCTION: This exam was performed according to the departmental dose-optimization program which includes automated exposure control, adjustment of the mA and/or kV according to patient size and/or use of iterative reconstruction technique. COMPARISON:  CT Brain 04/19/21 FINDINGS: Brain: No evidence  of acute infarction, hemorrhage, hydrocephalus, extra-axial collection or mass lesion/mass effect. Sequela of mild chronic microvascular ischemic change. Vascular: No hyperdense vessel or unexpected calcification. Skull: Normal. Negative for fracture or focal lesion. Sinuses/Orbits: Bilateral lens replacement. Paranasal sinuses and mastoid air cells are clear. Other: Nonspecific soft tissue thickening along the midline suboccipital scalp, similar to slightly more conspicuous (series 100, image 28). Correlate with physical exam. IMPRESSION: 1. No acute intracranial abnormality. 2. Nonspecific soft tissue thickening along the midline suboccipital scalp. Correlate with physical exam. Electronically Signed   By: Marin Roberts M.D.   On: 06/22/2022 14:33   DG Chest Port 1 View  Result Date: 06/16/2022 CLINICAL DATA:  Acute respiratory failure with hypoxia and hypercapnia. Patient on vent. EXAM: PORTABLE CHEST 1 VIEW COMPARISON:  AP chest 06/15/2022 (multiple studies), chest two views 01/28/2020 FINDINGS: Endotracheal tube tip terminates approximately 6.3 cm above the carina, at the inferior aspect of the clavicular heads and similar to prior. Enteric tube descends below the diaphragm with the tip and side port overlying the left upper abdominal quadrant. Moderately decreased lung volumes. Right mid and lower lung horizontal linear densities, likely subsegmental atelectasis. No focal left lung airspace opacity.  No definite pleural effusion. No pneumothorax. No acute skeletal abnormality. IMPRESSION: 1. Endotracheal tube tip terminates approximately 6.3 cm above the carina and similar to prior. 2. Enteric tube descends below the diaphragm with the tip and side port overlying the left upper abdominal quadrant. 3. Right mid and lower lung linear opacities are similar to prior, probable atelectasis. It is difficult to exclude mild pneumonia. Electronically Signed   By: Yvonne Kendall M.D.   On: 06/16/2022 09:02   DG CHEST PORT 1 VIEW  Result Date: 06/15/2022 CLINICAL DATA:  OG tube placement EXAM: PORTABLE CHEST 1 VIEW COMPARISON:  06/15/2022, 2:47 p.m. FINDINGS: Interval placement of esophagogastric tube, tip and side port below the diaphragm. Otherwise unchanged rotated AP portable examination with endotracheal tube over the midtrachea, as well as bilateral perihilar heterogeneous and consolidative airspace opacities. No new airspace opacity. IMPRESSION: 1. Interval placement of esophagogastric tube, tip and side port below the diaphragm. 2. Otherwise unchanged rotated AP portable examination with endotracheal tube over the midtrachea, as well as bilateral perihilar heterogeneous and consolidative airspace opacities. Electronically Signed   By: Delanna Ahmadi M.D.   On: 06/15/2022 18:15   DG Chest Port 1 View  Result Date: 06/15/2022 CLINICAL DATA:  ETT placement EXAM: PORTABLE CHEST 1 VIEW COMPARISON:  Chest x-ray dated July 18th 2021 FINDINGS: ETT tip is proximally 4.5 cm from the carina. Cardiac and mediastinal contours within normal limits. Left perihilar opacities and right mid lung linear opacities, likely due to atelectasis. No pleural effusion or pneumothorax. IMPRESSION: Left perihilar opacities and right mid lung linear opacities, likely due to atelectasis. Electronically Signed   By: Yetta Glassman M.D.   On: 06/15/2022 15:01   ECHOCARDIOGRAM COMPLETE  Result Date: 06/13/2022    ECHOCARDIOGRAM REPORT    Patient Name:   Justin [name] Date of Exam: 06/13/2022 Medical Rec #:  [id number]      Height:       70.0 in Accession #:    [id number]     Weight:       234.0 lb Date of Birth:  Nov 14, 1945      BSA:          2.231 m Patient Age:    33 years       BP:  98/51 mmHg Patient Gender: M              HR:           109 bpm. Exam Location:  Inpatient Procedure: 2D Echo, Color Doppler, Cardiac Doppler and Intracardiac            Opacification Agent Indications:    Atrial Fibrillation I48.91  History:        Patient has prior history of Echocardiogram examinations, most                 recent 01/30/2020. Aortic Valve Disease, Arrythmias:Atrial                 Fibrillation; Risk Factors:Hypertension and Dyslipidemia. End                 stage renal disease. Severe sepsis.  Sonographer:    Darlina Sicilian RDCS Referring Phys: [phone] [facility]  Sonographer Comments: Suboptimal apical window, suboptimal subcostal window, suboptimal parasternal window and Technically difficult study due to poor echo windows. IMPRESSIONS  1. Left ventricular ejection fraction, by estimation, is 60 to 65%. The left ventricle has normal function. Left ventricular diastolic parameters are indeterminate.  2. Right ventricular systolic function is low normal. The right ventricular size is mildly enlarged. There is moderately elevated pulmonary artery systolic pressure.  3. The mitral valve is grossly normal. No evidence of mitral valve regurgitation. No evidence of mitral stenosis.  4. Tricuspid valve regurgitation is moderate.  5. Poor acoustic windows limit study Assessment of AV also limited by atrial fibrillation     AV is thickened, calcified Peak and mean gradients through the valve are 39 and 21 mm Hg Dimensionless index is 0.33     Overall ocnsistent with moderate AS. COmpared to echo report from 2021, mean gradient is increaed. The aortic valve is calcified. There is mild calcification of the aortic valve. Aortic valve regurgitation is  not visualized. Moderate aortic valve stenosis. FINDINGS  Left Ventricle: Left ventricular ejection fraction, by estimation, is 60 to 65%. The left ventricle has normal function. Definity contrast agent was given IV to delineate the left ventricular endocardial borders. The left ventricular internal cavity size was normal in size. There is no left ventricular hypertrophy. Left ventricular diastolic parameters are indeterminate. Right Ventricle: The right ventricular size is mildly enlarged. No increase in right ventricular wall thickness. Right ventricular systolic function is low normal. There is moderately elevated pulmonary artery systolic pressure. The tricuspid regurgitant  velocity is 3.30 m/s, and with an assumed right atrial pressure of 8 mmHg, the estimated right ventricular systolic pressure is 10.2 mmHg. Left Atrium: Left atrial size was normal in size. Right Atrium: Right atrial size was normal in size. Pericardium: There is no evidence of pericardial effusion. Mitral Valve: The mitral valve is grossly normal. No evidence of mitral valve regurgitation. No evidence of mitral valve stenosis. Tricuspid Valve: The tricuspid valve is normal in structure. Tricuspid valve regurgitation is moderate . No evidence of tricuspid stenosis. Aortic Valve: Poor acoustic windows limit study Assessment of AV also limited by atrial fibrillation AV is thickened, calcified Peak and mean gradients through the valve are 39 and 21 mm Hg Dimensionless index is 0.33 Overall ocnsistent with moderate AS. COmpared to echo report from 2021, mean gradient is increaed. The aortic valve is calcified. There is mild calcification of the aortic valve. Aortic valve regurgitation is not visualized. Moderate aortic stenosis is present. Aortic valve mean gradient measures 28.0 mmHg. Aortic  valve peak gradient measures 44.6 mmHg. Aortic valve area, by VTI measures 1.03 cm. Pulmonic Valve: The pulmonic valve was grossly normal. Pulmonic valve  regurgitation is not visualized. No evidence of pulmonic stenosis. Aorta: The aortic root and ascending aorta are structurally normal, with no evidence of dilitation. IAS/Shunts: No atrial level shunt detected by color flow Doppler.  LEFT VENTRICLE PLAX 2D LVIDd:         4.40 cm   Diastology LVIDs:         2.50 cm   LV e' medial:    10.55 cm/s LV PW:         0.90 cm   LV E/e' medial:  12.9 LV IVS:        0.90 cm   LV e' lateral:   13.05 cm/s LVOT diam:     2.00 cm   LV E/e' lateral: 10.5 LV SV:         65 LV SV Index:   29 LVOT Area:     3.14 cm  RIGHT VENTRICLE RV S prime:     13.20 cm/s TAPSE (M-mode): 2.2 cm LEFT ATRIUM           Index LA diam:      4.10 cm 1.84 cm/m LA Vol (A4C): 66.4 ml 29.76 ml/m  AORTIC VALVE AV Area (Vmax):    0.98 cm AV Area (Vmean):   1.03 cm AV Area (VTI):     1.03 cm AV Vmax:           334.00 cm/s AV Vmean:          254.000 cm/s AV VTI:            0.634 m AV Peak Grad:      44.6 mmHg AV Mean Grad:      28.0 mmHg LVOT Vmax:         104.00 cm/s LVOT Vmean:        83.500 cm/s LVOT VTI:          0.207 m LVOT/AV VTI ratio: 0.33  AORTA Ao Root diam: 3.40 cm Ao Asc diam:  3.70 cm MITRAL VALVE                TRICUSPID VALVE MV Area (PHT): 6.83 cm     TR Peak grad:   43.6 mmHg MV Decel Time: 111 msec     TR Vmax:        330.00 cm/s MV E velocity: 136.50 cm/s                             SHUNTS                             Systemic VTI:  0.21 m                             Systemic Diam: 2.00 cm Dorris Carnes MD Electronically signed by Dorris Carnes MD Signature Date/Time: 06/13/2022/2:38:06 PM    Final    CT PELVIS W CONTRAST  Result Date: 06/12/2022 CLINICAL DATA:  Left buttock abscess for 1 month EXAM: CT PELVIS WITH CONTRAST TECHNIQUE: Multidetector CT imaging of the pelvis was performed using the standard protocol following the bolus administration of intravenous contrast. RADIATION DOSE REDUCTION: This exam was performed according to the departmental dose-optimization program which  includes automated exposure  control, adjustment of the mA and/or kV according to patient size and/or use of iterative reconstruction technique. CONTRAST:  155m OMNIPAQUE IOHEXOL 300 MG/ML  SOLN COMPARISON:  05/05/2022, 05/27/2020 FINDINGS: Urinary Tract: Distal ureters are unremarkable. Bladder is decompressed, limiting its evaluation. Bowel: No bowel obstruction or ileus. Normal appendix right lower quadrant. No bowel wall thickening or inflammatory change. Vascular/Lymphatic: Atherosclerosis of the aorta and its distal branches. No pathologically enlarged lymph nodes within the pelvis. Reproductive: Prostate is enlarged, measuring 5.5 x 4.3 cm. Fiduciary markers are seen within the prostate. Other: There is no free fluid or free intraperitoneal gas. Fat containing umbilical hernia is noted. No bowel herniation. Musculoskeletal: Multilocular gas and fluid collections are seen within the buttocks, consistent with perianal abscesses. The largest gas and fluid collection within the right gluteal region measures 6.3 x 2.7 cm, reference image 50/2. This extends anteriorly into the perineum. More inferiorly within the buttocks are multilocular fluid collections, measuring up to 2.9 x 2.5 cm on the right and 3.8 x 2.5 cm on the left. I do not see any direct communication with the bowel lumen to suggest fistula. IMPRESSION: 1. Multilocular gas and fluid collection within the perianal region, extending from the perineum into the bilateral inferior buttocks, consistent with abscess. I do not see any communication between the gas and fluid collections and the bowel lumen. 2. Enlarged prostate. 3.  Aortic Atherosclerosis (ICD10-I70.0). Electronically Signed   By: MRanda NgoM.D.   On: 06/12/2022 20:01    CIrwin Brakeman MD  [facility]  If 7PM-7AM, please contact night-coverage [email or web address] Password [email or web address] 06/23/2022, 1:02 PM   LOS: 11 days

## 2022-06-24 LAB — CBC
HCT: 28.8 % — ABNORMAL LOW (ref 39.0–52.0)
Hemoglobin: 9.1 g/dL — ABNORMAL LOW (ref 13.0–17.0)
MCH: 30.8 pg (ref 26.0–34.0)
MCHC: 31.6 g/dL (ref 30.0–36.0)
MCV: 97.6 fL (ref 80.0–100.0)
Platelets: 382 10*3/uL (ref 150–400)
RBC: 2.95 MIL/uL — ABNORMAL LOW (ref 4.22–5.81)
RDW: 16.1 % — ABNORMAL HIGH (ref 11.5–15.5)
WBC: 11.2 10*3/uL — ABNORMAL HIGH (ref 4.0–10.5)
nRBC: 0 % (ref 0.0–0.2)

## 2022-06-24 LAB — GLUCOSE, CAPILLARY
Glucose-Capillary: 142 mg/dL — ABNORMAL HIGH (ref 70–99)
Glucose-Capillary: 110 mg/dL — ABNORMAL HIGH (ref 70–99)
Glucose-Capillary: 138 mg/dL — ABNORMAL HIGH (ref 70–99)
Glucose-Capillary: 157 mg/dL — ABNORMAL HIGH (ref 70–99)

## 2022-06-24 LAB — RENAL FUNCTION PANEL
Albumin: 2.5 g/dL — ABNORMAL LOW (ref 3.5–5.0)
Anion gap: 15 (ref 5–15)
BUN: 44 mg/dL — ABNORMAL HIGH (ref 8–23)
CO2: 25 mmol/L (ref 22–32)
Calcium: 9.1 mg/dL (ref 8.9–10.3)
Chloride: 97 mmol/L — ABNORMAL LOW (ref 98–111)
Creatinine, Ser: 9.91 mg/dL — ABNORMAL HIGH (ref 0.61–1.24)
GFR, Estimated: 5 mL/min — ABNORMAL LOW (ref >60)
Glucose, Bld: 130 mg/dL — ABNORMAL HIGH (ref 70–99)
Phosphorus: 6.7 mg/dL — ABNORMAL HIGH (ref 2.5–4.6)
Potassium: 3.6 mmol/L (ref 3.5–5.1)
Sodium: 137 mmol/L (ref 135–145)

## 2022-06-24 MED ORDER — NAPHAZOLINE-GLYCERIN 0.012-0.25 % OP SOLN
2.0000 [drp] | Freq: Four times a day (QID) | OPHTHALMIC | Status: DC | PRN
  Administered 2022-06-25: 2 [drp] via OPHTHALMIC

## 2022-06-24 NOTE — Progress Notes (Signed)
PROGRESS NOTE  Justin [name] OVF:[id number] DOB: [birth year] DOA: 06/12/2022 PCP: Michell Heinrich, DO  Brief History:  76 year old male with a history of ESRD (MWF) atrial fibrillation/atrial flutter, hypertension, hyperlipidemia, prediabetes, tobacco abuse in remission presenting with 1 month history of buttock pain, right greater than left.  Patient stated that it started as a boil that subsequently burst.  After testing, he began noticing worsening pain in his perianal region with sitting.  It has progressively gotten worse.  He denies any fevers, chills, chest pain, shortness of breath, coughing, hemoptysis, nausea, vomiting, direct abdominal pain.  He has not been on any recent antibiotics.  Because of his worsening pain, the patient presented for further evaluation and treatment.  He last had dialysis on 06/12/2022.  In the ED, the patient had a low-grade temperature 100.2 F.  He was initially hypotensive with a blood pressure of 86/43.  WBC 17.2, hemoglobin 10.8, platelets 199,000.  Sodium 140, potassium 3.5, bicarbonate 32, serum creatinine 5.32.  CT of the abdomen and pelvis showed a multiloculated/fluid collection and gas collection in the perianal region from the perineum to the inferior buttocks bilaterally.  The patient was started on vancomycin and Zosyn.  General surgery was consulted to assist with management.  The patient's apixaban was held in preparation for surgery.  Post op, the patient was left intubated, but was extubated after 24 hours.  He was weaned off levophed. He was seen by cardiology for NS Vtach for which he was started on IV amio which has been converted to po amio.  His operative cultures showed MDR Ecoli.  He was switched to cefepime and vanc was discontinued.  He remained clinically stable for d/c and TOC assisted in transition to SNF.  Assessment/Plan: Severe sepsis - resolved  -Secondary to perirectal abscess -Presented with tachycardia, leukocytosis, and  lactate 2.7 -06/14/22 arterial line inserted -06/14/22  CVL inserted by Dr. Arnoldo Morale, plan to DC 12/9 if no further pressor needed -Continue Zosyn for now - nephrology team started midodrine 10 mg TID to assist with weaning norepinephrine - weaned off norepinephrine 12/9; if holds and no further need for pressors, femoral line removed 12/9.   - pt remains off norepinephrine and now off midodrine as of 12/12   Perirectal abscess -General surgery consulted -restart apixaban now that central line removed -Echocardiogram EF 60-65%, low normal RVF, mod TR -I&D on 06/15/2022>>MDR E coli -surgery starting hydrogen peroxide irrigation treatments -remains on Zosyn with plan to transition to cefepime 12/11 with HD -discussed length of therapy with surgery and plan is to treat for 1 more week for total of 2 weeks from procedure with cefepime with dialysis -last dose cefepime on HD 12/18    Wide Complex tachycardia -?Vtach -Wide complex tachcyardia with thready pulse, received 15 seconds of CPR. Another short 5 second episode while in OR -appreciate cardiology--discussed with Dr. Harl Bowie -on amiodarone 200 mg BID x 3 weeks followed by 200 mg daily (12/28) per cardiology recs    Acute respiratory Failure with hypoxia -left intubated post-op -personally reviewed CXR 12/4--no edema or infiltrates -Dr. Melvyn Novas consulted -06/16/22--extubated -weaned to 3L [place] with 100% sat, wean oxygen as able  - now stable on RA   Pleuritic Chest Pain - resolved  - from frequent deep coughing - pain is reproducible with light palpation the chest wall - scheduled acetaminophen for anti-inflammatory treatment, change to PRN dosing 12/10 - cough syrup as needed.    Diabetes  mellitus type 2, controlled 06/16/22 A1C--6.5 -novolog sliding scale -Hypoglycemia from insulin - DC basal and prandial coverage  -was not on any agents prior to admission -CBGs now largely stable   Headache / right facial pain - possibly tension  headache - acetaminophen ordered - check CT head 12/11  - neuro exam is unremarkable    ESRD -He dialyzes on Monday Wednesday Friday -Nephrology consulted for next dialysis -Continue Velphoro and Sensipar -HD managed by nephrology service -next HD per nephrology team, planned for 12/13   Swollen left extremity - discussed with nephrologist Dr. Johnney Ou - they plan to see how he does during HD today to decide if inpt vascular testing needed vs outpt   Atrial flutter -restarted apixaban 12/10 -treated with amiodarone gtt and on 12/7 transitioned to oral amiodarone -cardiology recommending amiodarone 200 mg BID x 3 weeks followed by 200 mg daily   Mixed hyperlipidemia -Continue statin   Essential hypertension -Holding metoprolol secondary to hypotension   Obesity -BMI 33.58 -lifestyle modification     Family Communication: no family at bedside, discussed plan of care with patient who verbalized understanding; telephone call to niece 06/22/22 per pt request.     Consultants:  renal, general surgery, cardiology, PCCM   Code Status:  FULL    DVT Prophylaxis:  apixaban restarted 06/21/22     Procedures: As Listed in Progress Note Above   Antibiotics: Vanc 12/1>>12/6 Zosyn 12/1>>12/10 Cefepime in HD 12/11 >>         Subjective: Patient denies fevers, chills, headache, chest pain, dyspnea, nausea, vomiting, diarrhea, abdominal pain, dysuria, hematuria, hematochezia, and melena.   Objective: Vitals:   06/24/22 1400 06/24/22 1415 06/24/22 1440 06/24/22 1639  BP: (!) 140/72 135/70 110/76 114/74  Pulse: (!) 102 (!) 101 100 (!) 106  Resp: '16 15 18 20  '$ Temp:   98 F (36.7 C) 98.4 F (36.9 C)  TempSrc:   Oral Oral  SpO2:   96% 94%  Weight:   106.8 kg   Height:        Intake/Output Summary (Last 24 hours) at 06/24/2022 1755 Last data filed at 06/24/2022 1440 Gross per 24 hour  Intake 580 ml  Output 3000 ml  Net -2420 ml   Weight change: -3.3  kg Exam:  General:  Pt is alert, follows commands appropriately, not in acute distress HEENT: No icterus, No thrush, No neck mass, [place]/AT Cardiovascular: RRR, S1/S2, no rubs, no gallops Respiratory: CTA bilaterally, no wheezing, no crackles, no rhonchi Abdomen: Soft/+BS, non tender, non distended, no guarding Extremities: No edema, No lymphangitis, No petechiae, No rashes, no synovitis   Data Reviewed: I have personally reviewed following labs and imaging studies Basic Metabolic Panel: Recent Labs  Lab 06/18/22 0423 06/19/22 0410 06/20/22 0553 06/21/22 0342 06/22/22 0458 06/24/22 0412  NA 133* 132* 133* 132* 135 137  K 3.8 3.9 3.7 3.9 4.1 3.6  CL 94* 94* 95* 94* 97* 97*  CO2 '26 26 26 26 25 25  '$ GLUCOSE 242* 209* 87 65* 97 130*  BUN 52* 61* 39* 49* 58* 44*  CREATININE 7.48* 9.05* 6.73* 8.68* 10.39* 9.91*  CALCIUM 8.1* 8.1* 8.0* 8.6* 8.9 9.1  MG 1.9  --   --   --   --   --   PHOS 4.5 4.8* 4.4 6.0* 6.5* 6.7*   Liver Function Tests: Recent Labs  Lab 06/19/22 0410 06/20/22 0553 06/21/22 0342 06/22/22 0458 06/24/22 0412  ALBUMIN 2.2* 2.2* 2.3* 2.4* 2.5*   No results  for input(s): "LIPASE", "AMYLASE" in the last 168 hours. No results for input(s): "AMMONIA" in the last 168 hours. Coagulation Profile: No results for input(s): "INR", "PROTIME" in the last 168 hours. CBC: Recent Labs  Lab 06/18/22 0423 06/19/22 0410 06/20/22 0553 06/21/22 0342 06/22/22 0458 06/23/22 0324 06/24/22 0412  WBC 17.5* 17.5* 16.7* 14.0* 15.0* 11.2* 11.2*  NEUTROABS 13.7* 13.3* 13.0* 10.8* 11.6*  --   --   HGB 9.8* 9.4* 8.9* 9.1* 9.3* 9.3* 9.1*  HCT 31.0* 29.9* 28.3* 28.7* 29.2* 28.9* 28.8*  MCV 96.0 96.8 96.6 97.0 96.7 96.0 97.6  PLT 338 318 351 342 404* 393 382   Cardiac Enzymes: No results for input(s): "CKTOTAL", "CKMB", "CKMBINDEX", "TROPONINI" in the last 168 hours. BNP: Invalid input(s): "POCBNP" CBG: Recent Labs  Lab 06/23/22 1717 06/23/22 2131 06/24/22 0314 06/24/22 0745  06/24/22 1636  GLUCAP 241* 186* 142* 110* 138*   HbA1C: No results for input(s): "HGBA1C" in the last 72 hours. Urine analysis:    Component Value Date/Time   COLORURINE AMBER (A) 05/27/2020 1451   APPEARANCEUR CLOUDY (A) 05/27/2020 1451   LABSPEC 1.017 05/27/2020 1451   PHURINE 6.0 05/27/2020 1451   GLUCOSEU 50 (A) 05/27/2020 1451   HGBUR LARGE (A) 05/27/2020 1451   BILIRUBINUR NEGATIVE 05/27/2020 1451   KETONESUR NEGATIVE 05/27/2020 1451   PROTEINUR 100 (A) 05/27/2020 1451   UROBILINOGEN 0.2 08/02/2010 1534   NITRITE NEGATIVE 05/27/2020 1451   LEUKOCYTESUR SMALL (A) 05/27/2020 1451   Sepsis Labs: '@LABRCNTIP'$ (procalcitonin:4,lacticidven:4) ) Recent Results (from the past 240 hour(s))  Aerobic/Anaerobic Culture w Gram Stain (surgical/deep wound)     Status: None   Collection Time: 06/15/22  1:44 PM   Specimen: PATH Other; Tissue  Result Value Ref Range Status   Specimen Description ABSCESS  Final   Special Requests PERIRECTAL  Final   Gram Stain   Final    FEW WBC PRESENT, PREDOMINANTLY PMN RARE GRAM POSITIVE COCCI IN CLUSTERS RARE GRAM POSITIVE RODS    Culture   Final    FEW ESCHERICHIA COLI NO ANAEROBES ISOLATED Performed at [facility], [place] [address], [place], [place] [address]    Report Status 06/20/2022 FINAL  Final   Organism ID, Bacteria ESCHERICHIA COLI  Final      Susceptibility   Escherichia coli - MIC*    AMPICILLIN >=32 RESISTANT Resistant     CEFAZOLIN >=64 RESISTANT Resistant     CEFEPIME <=0.12 SENSITIVE Sensitive     CEFTAZIDIME 4 SENSITIVE Sensitive     CEFTRIAXONE <=0.25 SENSITIVE Sensitive     CIPROFLOXACIN >=4 RESISTANT Resistant     GENTAMICIN <=1 SENSITIVE Sensitive     IMIPENEM <=0.25 SENSITIVE Sensitive     TRIMETH/SULFA >=320 RESISTANT Resistant     AMPICILLIN/SULBACTAM >=32 RESISTANT Resistant     PIP/TAZO <=4 SENSITIVE Sensitive     * FEW ESCHERICHIA COLI     Scheduled Meds:  acetaminophen  650 mg Oral Q4H   Or    acetaminophen  650 mg Rectal Q4H   amiodarone  200 mg Oral BID   Chlorhexidine Gluconate Cloth  6 each Topical Daily   insulin aspart  0-6 Units Subcutaneous TID AC & HS   pantoprazole  40 mg Oral QAC breakfast   polyethylene glycol  17 g Oral Daily   sodium chloride flush  10-40 mL Intracatheter Q12H   sucroferric oxyhydroxide  500 mg Oral TID WC   Continuous Infusions:  sodium chloride     ceFEPime (MAXIPIME) IV 2  g (06/24/22 1345)    Procedures/Studies: CT HEAD WO CONTRAST (5MM)  Result Date: 06/22/2022 CLINICAL DATA:  Stroke suspected. Headache. Right-sided facial pain. EXAM: CT HEAD WITHOUT CONTRAST TECHNIQUE: Contiguous axial images were obtained from the base of the skull through the vertex without intravenous contrast. RADIATION DOSE REDUCTION: This exam was performed according to the departmental dose-optimization program which includes automated exposure control, adjustment of the mA and/or kV according to patient size and/or use of iterative reconstruction technique. COMPARISON:  CT Brain 04/19/21 FINDINGS: Brain: No evidence of acute infarction, hemorrhage, hydrocephalus, extra-axial collection or mass lesion/mass effect. Sequela of mild chronic microvascular ischemic change. Vascular: No hyperdense vessel or unexpected calcification. Skull: Normal. Negative for fracture or focal lesion. Sinuses/Orbits: Bilateral lens replacement. Paranasal sinuses and mastoid air cells are clear. Other: Nonspecific soft tissue thickening along the midline suboccipital scalp, similar to slightly more conspicuous (series 100, image 28). Correlate with physical exam. IMPRESSION: 1. No acute intracranial abnormality. 2. Nonspecific soft tissue thickening along the midline suboccipital scalp. Correlate with physical exam. Electronically Signed   By: Marin Roberts M.D.   On: 06/22/2022 14:33   DG Chest Port 1 View  Result Date: 06/16/2022 CLINICAL DATA:  Acute respiratory failure with hypoxia and  hypercapnia. Patient on vent. EXAM: PORTABLE CHEST 1 VIEW COMPARISON:  AP chest 06/15/2022 (multiple studies), chest two views 01/28/2020 FINDINGS: Endotracheal tube tip terminates approximately 6.3 cm above the carina, at the inferior aspect of the clavicular heads and similar to prior. Enteric tube descends below the diaphragm with the tip and side port overlying the left upper abdominal quadrant. Moderately decreased lung volumes. Right mid and lower lung horizontal linear densities, likely subsegmental atelectasis. No focal left lung airspace opacity. No definite pleural effusion. No pneumothorax. No acute skeletal abnormality. IMPRESSION: 1. Endotracheal tube tip terminates approximately 6.3 cm above the carina and similar to prior. 2. Enteric tube descends below the diaphragm with the tip and side port overlying the left upper abdominal quadrant. 3. Right mid and lower lung linear opacities are similar to prior, probable atelectasis. It is difficult to exclude mild pneumonia. Electronically Signed   By: Yvonne Kendall M.D.   On: 06/16/2022 09:02   DG CHEST PORT 1 VIEW  Result Date: 06/15/2022 CLINICAL DATA:  OG tube placement EXAM: PORTABLE CHEST 1 VIEW COMPARISON:  06/15/2022, 2:47 p.m. FINDINGS: Interval placement of esophagogastric tube, tip and side port below the diaphragm. Otherwise unchanged rotated AP portable examination with endotracheal tube over the midtrachea, as well as bilateral perihilar heterogeneous and consolidative airspace opacities. No new airspace opacity. IMPRESSION: 1. Interval placement of esophagogastric tube, tip and side port below the diaphragm. 2. Otherwise unchanged rotated AP portable examination with endotracheal tube over the midtrachea, as well as bilateral perihilar heterogeneous and consolidative airspace opacities. Electronically Signed   By: Delanna Ahmadi M.D.   On: 06/15/2022 18:15   DG Chest Port 1 View  Result Date: 06/15/2022 CLINICAL DATA:  ETT placement  EXAM: PORTABLE CHEST 1 VIEW COMPARISON:  Chest x-ray dated July 18th 2021 FINDINGS: ETT tip is proximally 4.5 cm from the carina. Cardiac and mediastinal contours within normal limits. Left perihilar opacities and right mid lung linear opacities, likely due to atelectasis. No pleural effusion or pneumothorax. IMPRESSION: Left perihilar opacities and right mid lung linear opacities, likely due to atelectasis. Electronically Signed   By: Yetta Glassman M.D.   On: 06/15/2022 15:01   ECHOCARDIOGRAM COMPLETE  Result Date: 06/13/2022    ECHOCARDIOGRAM REPORT  Patient Name:   Justin [name] Date of Exam: 06/13/2022 Medical Rec #:  [id number]      Height:       70.0 in Accession #:    [id number]     Weight:       234.0 lb Date of Birth:  25-May-1946      BSA:          2.231 m Patient Age:    79 years       BP:           98/51 mmHg Patient Gender: M              HR:           109 bpm. Exam Location:  Inpatient Procedure: 2D Echo, Color Doppler, Cardiac Doppler and Intracardiac            Opacification Agent Indications:    Atrial Fibrillation I48.91  History:        Patient has prior history of Echocardiogram examinations, most                 recent 01/30/2020. Aortic Valve Disease, Arrythmias:Atrial                 Fibrillation; Risk Factors:Hypertension and Dyslipidemia. End                 stage renal disease. Severe sepsis.  Sonographer:    Darlina Sicilian RDCS Referring Phys: [phone] [facility]  Sonographer Comments: Suboptimal apical window, suboptimal subcostal window, suboptimal parasternal window and Technically difficult study due to poor echo windows. IMPRESSIONS  1. Left ventricular ejection fraction, by estimation, is 60 to 65%. The left ventricle has normal function. Left ventricular diastolic parameters are indeterminate.  2. Right ventricular systolic function is low normal. The right ventricular size is mildly enlarged. There is moderately elevated pulmonary artery systolic pressure.  3. The mitral valve  is grossly normal. No evidence of mitral valve regurgitation. No evidence of mitral stenosis.  4. Tricuspid valve regurgitation is moderate.  5. Poor acoustic windows limit study Assessment of AV also limited by atrial fibrillation     AV is thickened, calcified Peak and mean gradients through the valve are 39 and 21 mm Hg Dimensionless index is 0.33     Overall ocnsistent with moderate AS. COmpared to echo report from 2021, mean gradient is increaed. The aortic valve is calcified. There is mild calcification of the aortic valve. Aortic valve regurgitation is not visualized. Moderate aortic valve stenosis. FINDINGS  Left Ventricle: Left ventricular ejection fraction, by estimation, is 60 to 65%. The left ventricle has normal function. Definity contrast agent was given IV to delineate the left ventricular endocardial borders. The left ventricular internal cavity size was normal in size. There is no left ventricular hypertrophy. Left ventricular diastolic parameters are indeterminate. Right Ventricle: The right ventricular size is mildly enlarged. No increase in right ventricular wall thickness. Right ventricular systolic function is low normal. There is moderately elevated pulmonary artery systolic pressure. The tricuspid regurgitant  velocity is 3.30 m/s, and with an assumed right atrial pressure of 8 mmHg, the estimated right ventricular systolic pressure is 09.2 mmHg. Left Atrium: Left atrial size was normal in size. Right Atrium: Right atrial size was normal in size. Pericardium: There is no evidence of pericardial effusion. Mitral Valve: The mitral valve is grossly normal. No evidence of mitral valve regurgitation. No evidence of mitral valve stenosis. Tricuspid Valve: The tricuspid valve is normal in  structure. Tricuspid valve regurgitation is moderate . No evidence of tricuspid stenosis. Aortic Valve: Poor acoustic windows limit study Assessment of AV also limited by atrial fibrillation AV is thickened,  calcified Peak and mean gradients through the valve are 39 and 21 mm Hg Dimensionless index is 0.33 Overall ocnsistent with moderate AS. COmpared to echo report from 2021, mean gradient is increaed. The aortic valve is calcified. There is mild calcification of the aortic valve. Aortic valve regurgitation is not visualized. Moderate aortic stenosis is present. Aortic valve mean gradient measures 28.0 mmHg. Aortic valve peak gradient measures 44.6 mmHg. Aortic valve area, by VTI measures 1.03 cm. Pulmonic Valve: The pulmonic valve was grossly normal. Pulmonic valve regurgitation is not visualized. No evidence of pulmonic stenosis. Aorta: The aortic root and ascending aorta are structurally normal, with no evidence of dilitation. IAS/Shunts: No atrial level shunt detected by color flow Doppler.  LEFT VENTRICLE PLAX 2D LVIDd:         4.40 cm   Diastology LVIDs:         2.50 cm   LV e' medial:    10.55 cm/s LV PW:         0.90 cm   LV E/e' medial:  12.9 LV IVS:        0.90 cm   LV e' lateral:   13.05 cm/s LVOT diam:     2.00 cm   LV E/e' lateral: 10.5 LV SV:         65 LV SV Index:   29 LVOT Area:     3.14 cm  RIGHT VENTRICLE RV S prime:     13.20 cm/s TAPSE (M-mode): 2.2 cm LEFT ATRIUM           Index LA diam:      4.10 cm 1.84 cm/m LA Vol (A4C): 66.4 ml 29.76 ml/m  AORTIC VALVE AV Area (Vmax):    0.98 cm AV Area (Vmean):   1.03 cm AV Area (VTI):     1.03 cm AV Vmax:           334.00 cm/s AV Vmean:          254.000 cm/s AV VTI:            0.634 m AV Peak Grad:      44.6 mmHg AV Mean Grad:      28.0 mmHg LVOT Vmax:         104.00 cm/s LVOT Vmean:        83.500 cm/s LVOT VTI:          0.207 m LVOT/AV VTI ratio: 0.33  AORTA Ao Root diam: 3.40 cm Ao Asc diam:  3.70 cm MITRAL VALVE                TRICUSPID VALVE MV Area (PHT): 6.83 cm     TR Peak grad:   43.6 mmHg MV Decel Time: 111 msec     TR Vmax:        330.00 cm/s MV E velocity: 136.50 cm/s                             SHUNTS                             Systemic  VTI:  0.21 m  Systemic Diam: 2.00 cm Dorris Carnes MD Electronically signed by Dorris Carnes MD Signature Date/Time: 06/13/2022/2:38:06 PM    Final    CT PELVIS W CONTRAST  Result Date: 06/12/2022 CLINICAL DATA:  Left buttock abscess for 1 month EXAM: CT PELVIS WITH CONTRAST TECHNIQUE: Multidetector CT imaging of the pelvis was performed using the standard protocol following the bolus administration of intravenous contrast. RADIATION DOSE REDUCTION: This exam was performed according to the departmental dose-optimization program which includes automated exposure control, adjustment of the mA and/or kV according to patient size and/or use of iterative reconstruction technique. CONTRAST:  114m OMNIPAQUE IOHEXOL 300 MG/ML  SOLN COMPARISON:  05/05/2022, 05/27/2020 FINDINGS: Urinary Tract: Distal ureters are unremarkable. Bladder is decompressed, limiting its evaluation. Bowel: No bowel obstruction or ileus. Normal appendix right lower quadrant. No bowel wall thickening or inflammatory change. Vascular/Lymphatic: Atherosclerosis of the aorta and its distal branches. No pathologically enlarged lymph nodes within the pelvis. Reproductive: Prostate is enlarged, measuring 5.5 x 4.3 cm. Fiduciary markers are seen within the prostate. Other: There is no free fluid or free intraperitoneal gas. Fat containing umbilical hernia is noted. No bowel herniation. Musculoskeletal: Multilocular gas and fluid collections are seen within the buttocks, consistent with perianal abscesses. The largest gas and fluid collection within the right gluteal region measures 6.3 x 2.7 cm, reference image 50/2. This extends anteriorly into the perineum. More inferiorly within the buttocks are multilocular fluid collections, measuring up to 2.9 x 2.5 cm on the right and 3.8 x 2.5 cm on the left. I do not see any direct communication with the bowel lumen to suggest fistula. IMPRESSION: 1. Multilocular gas and fluid collection  within the perianal region, extending from the perineum into the bilateral inferior buttocks, consistent with abscess. I do not see any communication between the gas and fluid collections and the bowel lumen. 2. Enlarged prostate. 3.  Aortic Atherosclerosis (ICD10-I70.0). Electronically Signed   By: MRanda NgoM.D.   On: 06/12/2022 20:01    DOrson Eva DO  [facility]  If 7PM-7AM, please contact night-coverage [email or web address] Password [email or web address] 06/24/2022, 5:55 PM   LOS: 12 days

## 2022-06-24 NOTE — Progress Notes (Signed)
Patient ID: Justin [name], male   DOB: Feb 27, 1946, 76 y.o.   MRN: [id number] S: No new complaints O:BP (!) 158/82 (BP Location: Right Arm)   Pulse 99   Temp (!) 97.3 F (36.3 C)   Resp 18   Ht '5\' 10"'$  (1.778 m)   Wt 110.3 kg   SpO2 96%   BMI 34.89 kg/m   Intake/Output Summary (Last 24 hours) at 06/24/2022 0954 Last data filed at 06/24/2022 0551 Gross per 24 hour  Intake 820 ml  Output --  Net 820 ml   Intake/Output: I/O last 3 completed shifts: In: 1060 [P.O.:960; IV Piggyback:100] Out: 2800 [Other:2800]  Intake/Output this shift:  No intake/output data recorded. Weight change: -3.3 kg Gen: NAD CVS: RRR Resp:CTA Abd: +BS, soft, NT/ND Ext: trace pretibial edema, LUE AVF +T/B, +1 edema LUE   Recent Labs  Lab 06/18/22 0423 06/19/22 0410 06/20/22 0553 06/21/22 0342 06/22/22 0458 06/24/22 0412  NA 133* 132* 133* 132* 135 137  K 3.8 3.9 3.7 3.9 4.1 3.6  CL 94* 94* 95* 94* 97* 97*  CO2 '26 26 26 26 25 25  '$ GLUCOSE 242* 209* 87 65* 97 130*  BUN 52* 61* 39* 49* 58* 44*  CREATININE 7.48* 9.05* 6.73* 8.68* 10.39* 9.91*  ALBUMIN 2.3* 2.2* 2.2* 2.3* 2.4* 2.5*  CALCIUM 8.1* 8.1* 8.0* 8.6* 8.9 9.1  PHOS 4.5 4.8* 4.4 6.0* 6.5* 6.7*   Liver Function Tests: Recent Labs  Lab 06/21/22 0342 06/22/22 0458 06/24/22 0412  ALBUMIN 2.3* 2.4* 2.5*   No results for input(s): "LIPASE", "AMYLASE" in the last 168 hours. No results for input(s): "AMMONIA" in the last 168 hours. CBC: Recent Labs  Lab 06/19/22 0410 06/20/22 0553 06/21/22 0342 06/22/22 0458 06/23/22 0324  WBC 17.5* 16.7* 14.0* 15.0* 11.2*  NEUTROABS 13.3* 13.0* 10.8* 11.6*  --   HGB 9.4* 8.9* 9.1* 9.3* 9.3*  HCT 29.9* 28.3* 28.7* 29.2* 28.9*  MCV 96.8 96.6 97.0 96.7 96.0  PLT 318 351 342 404* 393   Cardiac Enzymes: No results for input(s): "CKTOTAL", "CKMB", "CKMBINDEX", "TROPONINI" in the last 168 hours. CBG: Recent Labs  Lab 06/23/22 1151 06/23/22 1717 06/23/22 2131 06/24/22 0314 06/24/22 0745   GLUCAP 210* 241* 186* 142* 110*    Iron Studies: No results for input(s): "IRON", "TIBC", "TRANSFERRIN", "FERRITIN" in the last 72 hours. Studies/Results: CT HEAD WO CONTRAST (5MM)  Result Date: 06/22/2022 CLINICAL DATA:  Stroke suspected. Headache. Right-sided facial pain. EXAM: CT HEAD WITHOUT CONTRAST TECHNIQUE: Contiguous axial images were obtained from the base of the skull through the vertex without intravenous contrast. RADIATION DOSE REDUCTION: This exam was performed according to the departmental dose-optimization program which includes automated exposure control, adjustment of the mA and/or kV according to patient size and/or use of iterative reconstruction technique. COMPARISON:  CT Brain 04/19/21 FINDINGS: Brain: No evidence of acute infarction, hemorrhage, hydrocephalus, extra-axial collection or mass lesion/mass effect. Sequela of mild chronic microvascular ischemic change. Vascular: No hyperdense vessel or unexpected calcification. Skull: Normal. Negative for fracture or focal lesion. Sinuses/Orbits: Bilateral lens replacement. Paranasal sinuses and mastoid air cells are clear. Other: Nonspecific soft tissue thickening along the midline suboccipital scalp, similar to slightly more conspicuous (series 100, image 28). Correlate with physical exam. IMPRESSION: 1. No acute intracranial abnormality. 2. Nonspecific soft tissue thickening along the midline suboccipital scalp. Correlate with physical exam. Electronically Signed   By: Marin Roberts M.D.   On: 06/22/2022 14:33    acetaminophen  650 mg Oral Q4H   Or  acetaminophen  650 mg Rectal Q4H   amiodarone  200 mg Oral BID   Chlorhexidine Gluconate Cloth  6 each Topical Daily   insulin aspart  0-6 Units Subcutaneous TID AC & HS   pantoprazole  40 mg Oral QAC breakfast   polyethylene glycol  17 g Oral Daily   sodium chloride flush  10-40 mL Intracatheter Q12H   sucroferric oxyhydroxide  500 mg Oral TID WC    BMET    Component Value  Date/Time   NA 137 06/24/2022 0412   K 3.6 06/24/2022 0412   CL 97 (L) 06/24/2022 0412   CO2 25 06/24/2022 0412   GLUCOSE 130 (H) 06/24/2022 0412   BUN 44 (H) 06/24/2022 0412   CREATININE 9.91 (H) 06/24/2022 0412   CALCIUM 9.1 06/24/2022 0412   GFRNONAA 5 (L) 06/24/2022 0412   GFRAA 8 (L) 02/01/2020 0424   CBC    Component Value Date/Time   WBC 11.2 (H) 06/23/2022 0324   RBC 3.01 (L) 06/23/2022 0324   HGB 9.3 (L) 06/23/2022 0324   HCT 28.9 (L) 06/23/2022 0324   PLT 393 06/23/2022 0324   MCV 96.0 06/23/2022 0324   MCH 30.9 06/23/2022 0324   MCHC 32.2 06/23/2022 0324   RDW 15.6 (H) 06/23/2022 0324   LYMPHSABS 1.5 06/22/2022 0458   MONOABS 1.3 (H) 06/22/2022 0458   EOSABS 0.2 06/22/2022 0458   BASOSABS 0.1 06/22/2022 0458    Assessment/Plan: 1. Severe sepsis/ perirectal abscess: s/p I and D 12/4 with gen surg.  Vanc/ Zosyn.  Off pressors now.  Remains on midodrine. Bp's have improved.  Will likely need some PT/OT due to deconditioning and weakness following sepsis.    2. ESRD:  [place].  MWF, using AVF.  HD on schedule 06/19/22, next 12/10. Issues with access BFR not > 239m/min and LUE edema.  Poor BFR and elevated venous pressures with HD but able to complete treatment.  Plan for HD again today and UF as tolerated.   3. 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 Nutrition: renal diet with fluid restriction   6. S/p cardiac arrest- brief, in OR, ROSC achieved, Vtach   7.  Afib: on amio and OP AC  8.  Vascular access - was seen by Dr. EDonnetta Hutchingyesterday who suspects central venous stenosis as cause of left arm swelling and increased venous pressures.  Mr. DTritzprefers to have this worked up as an outpatient with his primary Nephrology team.  JDonetta Potts MD [phone]

## 2022-06-24 NOTE — Procedures (Signed)
   HEMODIALYSIS TREATMENT NOTE:  Uneventful 3.5 hour heparin-free treatment completed using LUE AVF.  Left arm swelling is resolving and AVF was cannulated with 16g needles x 2 (antegrade) without difficulty.  Average Qb 300 with VPs 250-270.   Goal met: 3 liters removed without interruption in UF.  All blood was returned and hemostasis was achieved in 20 minutes.  Report called to Marianna Payment, LPN.   Rockwell Alexandria, RN

## 2022-06-24 NOTE — TOC Progression Note (Signed)
Transition of Care [facility]) - Progression Note    Patient Details  Name: Justin [name] MRN: [id number] Date of Birth: [birth year]  Transition of Care [facility]) CM/SW [place], [place] Phone Number: 06/24/2022, 10:08 AM  Clinical Narrative:    CSW spoke to admissions at [facility] who states pt will need to have family provide transport to HD. CSW spoke with Ebony Hail at [facility] who states pt could transfer to the [place] HD office while he is doing SNF at their facility. CSW spoke with pt to explain options. Pt states he would be agreeable to [facility] referral being sent. CSW sent referral in [place]. CSW reached out to [facility] with [facility] about getting pt switched from [place] to [place]. TOC to send clinicals to [facility]. TOC to follow.    Expected Discharge Plan: Skilled Nursing Facility Barriers to Discharge: Ship broker, SNF Pending bed offer  Expected Discharge Plan and Services Expected Discharge Plan: [place] In-house Referral: Clinical Social Work   Post Acute Care Choice: [place] Living arrangements for the past 2 months: [place] Determinants of Health (SDOH) Interventions    Readmission Risk Interventions    06/17/2022    2:47 PM  Readmission Risk Prevention Plan  Transportation Screening Complete  HRI or [place] Complete  Social Work Consult for [place] Planning/Counseling Complete  Palliative Care Screening Not Applicable  Medication Review Press photographer) Complete

## 2022-06-25 DIAGNOSIS — K611 Rectal abscess: Secondary | ICD-10-CM

## 2022-06-25 LAB — GLUCOSE, CAPILLARY
Glucose-Capillary: 116 mg/dL — ABNORMAL HIGH (ref 70–99)
Glucose-Capillary: 100 mg/dL — ABNORMAL HIGH (ref 70–99)
Glucose-Capillary: 133 mg/dL — ABNORMAL HIGH (ref 70–99)
Glucose-Capillary: 133 mg/dL — ABNORMAL HIGH (ref 70–99)
Glucose-Capillary: 166 mg/dL — ABNORMAL HIGH (ref 70–99)
Glucose-Capillary: 150 mg/dL — ABNORMAL HIGH (ref 70–99)

## 2022-06-25 MED ORDER — METOPROLOL TARTRATE 25 MG PO TABS
12.5000 mg | ORAL_TABLET | Freq: Two times a day (BID) | ORAL | Status: DC
  Administered 2022-06-25 – 2022-06-26 (×3): 12.5 mg via ORAL
  Filled 2022-06-25 (×3): qty 1

## 2022-06-25 MED ORDER — LOPERAMIDE HCL 2 MG PO CAPS
2.0000 mg | ORAL_CAPSULE | Freq: Four times a day (QID) | ORAL | Status: DC | PRN
  Administered 2022-06-25: 2 mg via ORAL
  Filled 2022-06-25: qty 1

## 2022-06-25 MED ORDER — POLYVINYL ALCOHOL 1.4 % OP SOLN
1.0000 [drp] | OPHTHALMIC | Status: DC | PRN
  Administered 2022-06-25 – 2022-06-26 (×2): 1 [drp] via OPHTHALMIC
  Filled 2022-06-25: qty 15

## 2022-06-25 MED ORDER — GABAPENTIN 100 MG PO CAPS
100.0000 mg | ORAL_CAPSULE | Freq: Every day | ORAL | Status: DC
  Administered 2022-06-25: 100 mg via ORAL
  Filled 2022-06-25: qty 1

## 2022-06-25 NOTE — Progress Notes (Signed)
Physical Therapy Treatment Patient Details Name: Justin [name] MRN: [id number] DOB: 06-08-1946 Today's Date: 06/25/2022   History of Present Illness Justin [name] is a 76 y.o. male with medical history significant of ESRD on HD (MWF), atrial fibrillation on Eliquis, GERD, hyperlipidemia, hypertension who presents to the emergency department due to 1 month of progressive pain in the buttock.  Patient states that it started with a boil and that after the boil bursts, he started to feel pain when he tries to sit, the pain is progressively worsened since onset of symptoms, so he decided to go to the ED for further evaluation and management.  warm compresses used at home did not relieve the symptoms.  About 2 months ago, patient states that he had an abscess to his back that required incision and drainage and that he had to take antibiotics for this. Patient denies chest pain, shortness of breath, palpitations, headache, nausea, vomiting, abdominal pain.  Last dialysis was today.    PT Comments    Patient demonstrates improvement for sitting up at bedside with labored movement, increased endurance/distance for taking steps forward/backward at bedside, but limited mostly due to fatigue and c/o increasing pain in feet.  Patient tolerated sitting up in chair after therapy - nursing staff notified.  Patient will benefit from continued skilled physical therapy in hospital and recommended venue below to increase strength, balance, endurance for safe ADLs and gait.     Recommendations for follow up therapy are one component of a multi-disciplinary discharge planning process, led by the attending physician.  Recommendations may be updated based on patient status, additional functional criteria and insurance authorization.  Follow Up Recommendations  Skilled nursing-short term rehab (<3 hours/day) Can patient physically be transported by private vehicle: Yes   Assistance Recommended at Discharge Set up  Supervision/Assistance  Patient can return home with the following A lot of help with walking and/or transfers;A little help with bathing/dressing/bathroom;Assistance with cooking/housework;Help with stairs or ramp for entrance   Equipment Recommendations  None recommended by PT    Recommendations for Other Services       Precautions / Restrictions Precautions Precautions: Fall Restrictions Weight Bearing Restrictions: No     Mobility  Bed Mobility Overal bed mobility: Needs Assistance Bed Mobility: Supine to Sit     Supine to sit: Min assist, Min guard     General bed mobility comments: increased time, labored movement    Transfers Overall transfer level: Needs assistance Equipment used: Rolling walker (2 wheels) Transfers: Sit to/from Stand, Bed to chair/wheelchair/BSC Sit to Stand: Min assist   Step pivot transfers: Min assist, Mod assist       General transfer comment: increased BLE strength for completing sit to stand and transferring to chair    Ambulation/Gait Ambulation/Gait assistance: Mod assist Gait Distance (Feet): 12 Feet Assistive device: Rolling walker (2 wheels) Gait Pattern/deviations: Decreased step length - right, Decreased step length - left, Decreased stride length Gait velocity: decreased     General Gait Details: increased tolerance for taking steps at bedside with slow labored movement, able to step forward/backwards, but limited mostly due to fatigue and pain/soreness in feet   Stairs             Wheelchair Mobility    Modified Rankin (Stroke Patients Only)       Balance Overall balance assessment: Needs assistance Sitting-balance support: Feet supported, No upper extremity supported Sitting balance-Leahy Scale: Good Sitting balance - Comments: seated at EOB   Standing balance  support: During functional activity, Bilateral upper extremity supported Standing balance-Leahy Scale: Fair Standing balance comment: using  RW                            Cognition Arousal/Alertness: Awake/alert Behavior During Therapy: WFL for tasks assessed/performed Overall Cognitive Status: Within Functional Limits for tasks assessed                                          Exercises General Exercises - Lower Extremity Long Arc Quad: Seated, AROM, Strengthening, Both, 10 reps Hip Flexion/Marching: Seated, AROM, Strengthening, Both, 10 reps Toe Raises: Seated, AROM, Strengthening, Both, 10 reps Heel Raises: Seated, AROM, Strengthening, Both, 10 reps    General Comments        Pertinent Vitals/Pain Pain Assessment Pain Assessment: Faces Faces Pain Scale: Hurts even more Pain Location: bilateral feet Pain Descriptors / Indicators: Sore, Discomfort, Grimacing Pain Intervention(s): Limited activity within patient's tolerance, Monitored during session, Repositioned    Home Living                          Prior Function            PT Goals (current goals can now be found in the care plan section) Acute Rehab PT Goals Patient Stated Goal: return home after rehab PT Goal Formulation: With patient Time For Goal Achievement: 07/06/22 Potential to Achieve Goals: Good Progress towards PT goals: Progressing toward goals    Frequency    Min 3X/week      PT Plan Current plan remains appropriate    Co-evaluation              AM-PAC PT "6 Clicks" Mobility   Outcome Measure  Help needed turning from your back to your side while in a flat bed without using bedrails?: None Help needed moving from lying on your back to sitting on the side of a flat bed without using bedrails?: A Little Help needed moving to and from a bed to a chair (including a wheelchair)?: A Lot Help needed standing up from a chair using your arms (e.g., wheelchair or bedside chair)?: A Lot Help needed to walk in hospital room?: A Lot Help needed climbing 3-5 steps with a railing? : A Lot 6  Click Score: 15    End of Session   Activity Tolerance: Patient tolerated treatment well;Patient limited by fatigue Patient left: in chair;with call bell/phone within reach Nurse Communication: Mobility status PT Visit Diagnosis: Unsteadiness on feet (R26.81);Other abnormalities of gait and mobility (R26.89);Muscle weakness (generalized) (M62.81)     Time: 2956-2130 PT Time Calculation (min) (ACUTE ONLY): 20 min  Charges:  $Therapeutic Exercise: 8-22 mins $Therapeutic Activity: 8-22 mins                     12:33 PM, 06/25/22 Lonell Grandchild, MPT Physical Therapist with [facility] [phone] office [phone] mobile phone

## 2022-06-25 NOTE — Progress Notes (Addendum)
Message sent to attending that pt is having new complaints of tenderness on the bottom of both feet, and pt is requesting eye drops for dry eyes. Pt will discuss with attending on rounds this am. Will continue to monitor

## 2022-06-25 NOTE — Progress Notes (Signed)
Patient ID: Justin [name], male   DOB: [birth year], 76 y.o.   MRN: [id number] S: pt seen and examined bedside. He reports that he tolerated HD yesterday, net UF 3L. Does complain of soreness of feet O:BP (!) 143/84 (BP Location: Right Arm)   Pulse (!) 102   Temp (!) 97.1 F (36.2 C)   Resp 18   Ht '5\' 10"'$  (1.778 m)   Wt 107.6 kg   SpO2 94%   BMI 34.04 kg/m   Intake/Output Summary (Last 24 hours) at 06/25/2022 0803 Last data filed at 06/25/2022 0600 Gross per 24 hour  Intake 680 ml  Output 3000 ml  Net -2320 ml   Intake/Output: I/O last 3 completed shifts: In: 1020 [P.O.:720; IV Piggyback:300] Out: 3000 [Other:3000]  Intake/Output this shift:  No intake/output data recorded. Weight change: -0.6 kg Gen: NAD CVS: RRR, +systolic murmur Resp:CTA Abd: +BS, soft, NT/ND Ext: trace pretibial edema, LUE AVF +T/B, +1 edema LUE  Neuro: awake, alert  Recent Labs  Lab 06/19/22 0410 06/20/22 0553 06/21/22 0342 06/22/22 0458 06/24/22 0412  NA 132* 133* 132* 135 137  K 3.9 3.7 3.9 4.1 3.6  CL 94* 95* 94* 97* 97*  CO2 '26 26 26 25 25  '$ GLUCOSE 209* 87 65* 97 130*  BUN 61* 39* 49* 58* 44*  CREATININE 9.05* 6.73* 8.68* 10.39* 9.91*  ALBUMIN 2.2* 2.2* 2.3* 2.4* 2.5*  CALCIUM 8.1* 8.0* 8.6* 8.9 9.1  PHOS 4.8* 4.4 6.0* 6.5* 6.7*   Liver Function Tests: Recent Labs  Lab 06/21/22 0342 06/22/22 0458 06/24/22 0412  ALBUMIN 2.3* 2.4* 2.5*   No results for input(s): "LIPASE", "AMYLASE" in the last 168 hours. No results for input(s): "AMMONIA" in the last 168 hours. CBC: Recent Labs  Lab 06/20/22 0553 06/21/22 0342 06/22/22 0458 06/23/22 0324 06/24/22 0412  WBC 16.7* 14.0* 15.0* 11.2* 11.2*  NEUTROABS 13.0* 10.8* 11.6*  --   --   HGB 8.9* 9.1* 9.3* 9.3* 9.1*  HCT 28.3* 28.7* 29.2* 28.9* 28.8*  MCV 96.6 97.0 96.7 96.0 97.6  PLT 351 342 404* 393 382   Cardiac Enzymes: No results for input(s): "CKTOTAL", "CKMB", "CKMBINDEX", "TROPONINI" in the last 168 hours. CBG: Recent  Labs  Lab 06/24/22 0745 06/24/22 1636 06/24/22 2022 06/25/22 0323 06/25/22 0712  GLUCAP 110* 138* 157* 116* 100*    Iron Studies: No results for input(s): "IRON", "TIBC", "TRANSFERRIN", "FERRITIN" in the last 72 hours. Studies/Results: No results found.  acetaminophen  650 mg Oral Q4H   Or   acetaminophen  650 mg Rectal Q4H   amiodarone  200 mg Oral BID   Chlorhexidine Gluconate Cloth  6 each Topical Daily   insulin aspart  0-6 Units Subcutaneous TID AC & HS   pantoprazole  40 mg Oral QAC breakfast   polyethylene glycol  17 g Oral Daily   sodium chloride flush  10-40 mL Intracatheter Q12H   sucroferric oxyhydroxide  500 mg Oral TID WC    BMET    Component Value Date/Time   NA 137 06/24/2022 0412   K 3.6 06/24/2022 0412   CL 97 (L) 06/24/2022 0412   CO2 25 06/24/2022 0412   GLUCOSE 130 (H) 06/24/2022 0412   BUN 44 (H) 06/24/2022 0412   CREATININE 9.91 (H) 06/24/2022 0412   CALCIUM 9.1 06/24/2022 0412   GFRNONAA 5 (L) 06/24/2022 0412   GFRAA 8 (L) 02/01/2020 0424   CBC    Component Value Date/Time   WBC 11.2 (H) 06/24/2022 2536  RBC 2.95 (L) 06/24/2022 0412   HGB 9.1 (L) 06/24/2022 0412   HCT 28.8 (L) 06/24/2022 0412   PLT 382 06/24/2022 0412   MCV 97.6 06/24/2022 0412   MCH 30.8 06/24/2022 0412   MCHC 31.6 06/24/2022 0412   RDW 16.1 (H) 06/24/2022 0412   LYMPHSABS 1.5 06/22/2022 0458   MONOABS 1.3 (H) 06/22/2022 0458   EOSABS 0.2 06/22/2022 0458   BASOSABS 0.1 06/22/2022 0458    Assessment/Plan: 1. Severe sepsis/ perirectal abscess: s/p I and D 12/4 with gen surg.  Vanc/ Zosyn.  Off pressors now.  Remains on midodrine. Bp's have improved.  Will  be d/c'ed to SNF given deconditioning  2. ESRD:  [place].  MWF, using AVF.  HD on schedule 06/19/22, next 12/10. Issues with access BFR not > 240m/min and LUE edema.  Poor BFR and elevated venous pressures with HD but able to complete treatment.  Will keep HD on MWF schedule   3. 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 Renal diet   5. Nutrition: renal diet with fluid restriction   6. S/p cardiac arrest- brief, in OR, ROSC achieved, Vtach   7.  Afib: on amio and OP AC  8.  Vascular access - was seen by Dr. EDonnetta Hutching12/12 who suspects central venous stenosis as cause of left arm swelling and increased venous pressures.  Mr. DAcreeprefers to have this worked up as an outpatient with his primary Nephrology team.  VGean Quint MD [facility]

## 2022-06-25 NOTE — TOC Progression Note (Signed)
Transition of Care [facility]) - Progression Note    Patient Details  Name: CURREN MOHRMANN MRN: [id number] Date of Birth: Oct 16, 1945  [facility]) CM/SW Contact  Shade Flood, LCSW Phone Number: 06/25/2022, 3:50 PM  Clinical Narrative:     TOC following. Pt has been assigned a TTS chair time at [facility]. They cannot start his treatment on a Saturday so the soonest he can have the outpatient HD is Tuesday next week.   Updated physicians and awaiting plan from Dr. Candiss Norse regarding what day pt will have HD here and then dc to [facility].  Updated Bryson Ha at [facility]. Will follow up tomorrow.  Expected Discharge Plan: Skilled Nursing Facility Barriers to Discharge: Ship broker, SNF Pending bed offer  Expected Discharge Plan and Services Expected Discharge Plan: [place] In-house Referral: Clinical Social Work   Post Acute Care Choice: [place] Living arrangements for the past 2 months: [place] Determinants of Health (SDOH) Interventions    Readmission Risk Interventions    06/17/2022    2:47 PM  Readmission Risk Prevention Plan  Transportation Screening Complete  HRI or [place] Complete  Social Work Consult for [place] Planning/Counseling Complete  Palliative Care Screening Not Applicable  Medication Review Press photographer) Complete

## 2022-06-25 NOTE — Progress Notes (Signed)
PROGRESS NOTE  Justin [name] VHQ:[id number] DOB: 12-16-1945 DOA: 06/12/2022 PCP: Michell Heinrich, DO  Brief History:  76 year old male with a history of ESRD (MWF) atrial fibrillation/atrial flutter, hypertension, hyperlipidemia, prediabetes, tobacco abuse in remission presenting with 1 month history of buttock pain, right greater than left.  Patient stated that it started as a boil that subsequently burst.  After testing, he began noticing worsening pain in his perianal region with sitting.  It has progressively gotten worse.  He denies any fevers, chills, chest pain, shortness of breath, coughing, hemoptysis, nausea, vomiting, direct abdominal pain.  He has not been on any recent antibiotics.  Because of his worsening pain, the patient presented for further evaluation and treatment.  He last had dialysis on 06/12/2022.  In the ED, the patient had a low-grade temperature 100.2 F.  He was initially hypotensive with a blood pressure of 86/43.  WBC 17.2, hemoglobin 10.8, platelets 199,000.  Sodium 140, potassium 3.5, bicarbonate 32, serum creatinine 5.32.  CT of the abdomen and pelvis showed a multiloculated/fluid collection and gas collection in the perianal region from the perineum to the inferior buttocks bilaterally.  The patient was started on vancomycin and Zosyn.  General surgery was consulted to assist with management.  The patient's apixaban was held in preparation for surgery.  Post op, the patient was left intubated, but was extubated after 24 hours.  He was weaned off levophed. He was seen by cardiology for NS Vtach for which he was started on IV amio which has been converted to po amio.  His operative cultures showed MDR Ecoli.  He was switched to cefepime and vanc was discontinued.  He remained clinically stable for d/c and TOC assisted in transition to SNF.   Assessment/Plan: Severe sepsis - resolved  -Secondary to perirectal abscess -Presented with tachycardia, leukocytosis, and  lactate 2.7 -06/14/22 arterial line inserted -06/14/22  CVL inserted by Dr. Arnoldo Morale, plan to DC 12/9 if no further pressor needed -Continue Zosyn for now - nephrology team started midodrine 10 mg TID to assist with weaning norepinephrine - weaned off norepinephrine 12/9; if holds and no further need for pressors, femoral line removed 12/9.   - pt remains off norepinephrine and now off midodrine as of 12/12   Perirectal abscess -General surgery consulted -restart apixaban now that central line removed -Echocardiogram EF 60-65%, low normal RVF, mod TR -I&D on 06/15/2022>>MDR E coli -surgery starting hydrogen peroxide irrigation treatments -remains on Zosyn with plan to transition to cefepime 12/11 with HD -discussed length of therapy with surgery and plan is to treat for 1 more week for total of 2 weeks from procedure with cefepime with dialysis -last dose cefepime on HD 12/18    Wide Complex tachycardia -?Vtach -Wide complex tachcyardia with thready pulse, received 15 seconds of CPR. Another short 5 second episode while in OR -appreciate cardiology--discussed with Dr. Harl Bowie -on amiodarone 200 mg BID x 3 weeks followed by 200 mg daily (12/28) per cardiology recs    Acute respiratory Failure with hypoxia -left intubated post-op -personally reviewed CXR 12/4--no edema or infiltrates -Dr. Melvyn Novas consulted -06/16/22--extubated -weaned to 3L [place] with 100% sat, wean oxygen as able  - now stable on RA   Pleuritic Chest Pain - resolved  - from frequent deep coughing - pain is reproducible with light palpation the chest wall - scheduled acetaminophen for anti-inflammatory treatment, change to PRN dosing 12/10 - cough syrup as needed.  Diabetes mellitus type 2, controlled 06/16/22 A1C--6.5 -novolog sliding scale -Hypoglycemia from insulin - DC basal and prandial coverage  -was not on any agents prior to admission -CBGs now largely stable   Headache / right facial pain - possibly tension  headache - acetaminophen ordered - check CT head 12/11  - neuro exam is unremarkable    ESRD -He dialyzes on Monday Wednesday Friday -Nephrology consulted for next dialysis -Continue Velphoro and Sensipar -HD managed by nephrology service -next HD per nephrology team, planned for 12/13   Swollen left extremity - discussed with nephrologist Dr. Johnney Ou - they plan to see how he does during HD today to decide if inpt vascular testing needed vs outpt -seen by vascular surgery, Dr. Kandis Fantasia is having adequate dialysis currently and he feels that it would be appropriate to defer fistulogram/intervention until after discharge.  Pt prefers to have in done in [place]   Atrial flutter -restarted apixaban 12/10 -treated with amiodarone gtt and on 12/7 transitioned to oral amiodarone -cardiology recommending amiodarone 200 mg BID x 3 weeks followed by 200 mg daily   Mixed hyperlipidemia -Continue statin   Essential hypertension -Holding metoprolol secondary to hypotension intially -restarting low dose metoprolol   Obesity -BMI 33.58 -lifestyle modification     Family Communication: no family at bedside, discussed plan of care with patient who verbalized understanding; telephone call to niece 06/22/22 per pt request.     Consultants:  renal, general surgery, cardiology, PCCM   Code Status:  FULL    DVT Prophylaxis:  apixaban restarted 06/21/22     Procedures: As Listed in Progress Note Above   Antibiotics: Vanc 12/1>>12/6 Zosyn 12/1>>12/10 Cefepime in HD 12/11 >>             Subjective: Complains of burning and pain in soles of bilateral feet.  Has loose stools x 3-4 days.  Denies cp, sob, n/v/ abd pain  Objective: Vitals:   06/24/22 2021 06/25/22 0321 06/25/22 0500 06/25/22 1438  BP: 132/74 (!) 143/84  (!) 140/74  Pulse: (!) 103 (!) 102  (!) 101  Resp: 18 18    Temp: (!) 97.3 F (36.3 C) (!) 97.1 F (36.2 C)  97.7 F (36.5 C)  TempSrc:    Oral  SpO2: 100%  94%  96%  Weight:   107.6 kg   Height:        Intake/Output Summary (Last 24 hours) at 06/25/2022 1745 Last data filed at 06/25/2022 1416 Gross per 24 hour  Intake 920 ml  Output --  Net 920 ml   Weight change: -0.6 kg Exam:  General:  Pt is alert, follows commands appropriately, not in acute distress HEENT: No icterus, No thrush, No neck mass, [place]/AT Cardiovascular: RRR, S1/S2, no rubs, no gallops Respiratory: CTA bilaterally, no wheezing, no crackles, no rhonchi Abdomen: Soft/+BS, non tender, non distended, no guarding Extremities: trace LE edema, No lymphangitis, No petechiae, No rashes, no synovitis   Data Reviewed: I have personally reviewed following labs and imaging studies Basic Metabolic Panel: Recent Labs  Lab 06/19/22 0410 06/20/22 0553 06/21/22 0342 06/22/22 0458 06/24/22 0412  NA 132* 133* 132* 135 137  K 3.9 3.7 3.9 4.1 3.6  CL 94* 95* 94* 97* 97*  CO2 '26 26 26 25 25  '$ GLUCOSE 209* 87 65* 97 130*  BUN 61* 39* 49* 58* 44*  CREATININE 9.05* 6.73* 8.68* 10.39* 9.91*  CALCIUM 8.1* 8.0* 8.6* 8.9 9.1  PHOS 4.8* 4.4 6.0* 6.5* 6.7*   Liver Function  Tests: Recent Labs  Lab 06/19/22 0410 06/20/22 0553 06/21/22 0342 06/22/22 0458 06/24/22 0412  ALBUMIN 2.2* 2.2* 2.3* 2.4* 2.5*   No results for input(s): "LIPASE", "AMYLASE" in the last 168 hours. No results for input(s): "AMMONIA" in the last 168 hours. Coagulation Profile: No results for input(s): "INR", "PROTIME" in the last 168 hours. CBC: Recent Labs  Lab 06/19/22 0410 06/20/22 0553 06/21/22 0342 06/22/22 0458 06/23/22 0324 06/24/22 0412  WBC 17.5* 16.7* 14.0* 15.0* 11.2* 11.2*  NEUTROABS 13.3* 13.0* 10.8* 11.6*  --   --   HGB 9.4* 8.9* 9.1* 9.3* 9.3* 9.1*  HCT 29.9* 28.3* 28.7* 29.2* 28.9* 28.8*  MCV 96.8 96.6 97.0 96.7 96.0 97.6  PLT 318 351 342 404* 393 382   Cardiac Enzymes: No results for input(s): "CKTOTAL", "CKMB", "CKMBINDEX", "TROPONINI" in the last 168 hours. BNP: Invalid  input(s): "POCBNP" CBG: Recent Labs  Lab 06/24/22 2022 06/25/22 0323 06/25/22 0712 06/25/22 1119 06/25/22 1632  GLUCAP 157* 116* 100* 133*  133* 166*   HbA1C: No results for input(s): "HGBA1C" in the last 72 hours. Urine analysis:    Component Value Date/Time   COLORURINE AMBER (A) 05/27/2020 1451   APPEARANCEUR CLOUDY (A) 05/27/2020 1451   LABSPEC 1.017 05/27/2020 1451   PHURINE 6.0 05/27/2020 1451   GLUCOSEU 50 (A) 05/27/2020 1451   HGBUR LARGE (A) 05/27/2020 1451   BILIRUBINUR NEGATIVE 05/27/2020 1451   KETONESUR NEGATIVE 05/27/2020 1451   PROTEINUR 100 (A) 05/27/2020 1451   UROBILINOGEN 0.2 08/02/2010 1534   NITRITE NEGATIVE 05/27/2020 1451   LEUKOCYTESUR SMALL (A) 05/27/2020 1451   Sepsis Labs: '@LABRCNTIP'$ (procalcitonin:4,lacticidven:4) )No results found for this or any previous visit (from the past 240 hour(s)).   Scheduled Meds:  acetaminophen  650 mg Oral Q4H   Or   acetaminophen  650 mg Rectal Q4H   amiodarone  200 mg Oral BID   Chlorhexidine Gluconate Cloth  6 each Topical Daily   insulin aspart  0-6 Units Subcutaneous TID AC & HS   metoprolol tartrate  12.5 mg Oral BID   pantoprazole  40 mg Oral QAC breakfast   sodium chloride flush  10-40 mL Intracatheter Q12H   sucroferric oxyhydroxide  500 mg Oral TID WC   Continuous Infusions:  sodium chloride     ceFEPime (MAXIPIME) IV 2 g (06/24/22 1345)    Procedures/Studies: CT HEAD WO CONTRAST (5MM)  Result Date: 06/22/2022 CLINICAL DATA:  Stroke suspected. Headache. Right-sided facial pain. EXAM: CT HEAD WITHOUT CONTRAST TECHNIQUE: Contiguous axial images were obtained from the base of the skull through the vertex without intravenous contrast. RADIATION DOSE REDUCTION: This exam was performed according to the departmental dose-optimization program which includes automated exposure control, adjustment of the mA and/or kV according to patient size and/or use of iterative reconstruction technique. COMPARISON:   CT Brain 04/19/21 FINDINGS: Brain: No evidence of acute infarction, hemorrhage, hydrocephalus, extra-axial collection or mass lesion/mass effect. Sequela of mild chronic microvascular ischemic change. Vascular: No hyperdense vessel or unexpected calcification. Skull: Normal. Negative for fracture or focal lesion. Sinuses/Orbits: Bilateral lens replacement. Paranasal sinuses and mastoid air cells are clear. Other: Nonspecific soft tissue thickening along the midline suboccipital scalp, similar to slightly more conspicuous (series 100, image 28). Correlate with physical exam. IMPRESSION: 1. No acute intracranial abnormality. 2. Nonspecific soft tissue thickening along the midline suboccipital scalp. Correlate with physical exam. Electronically Signed   By: Marin Roberts M.D.   On: 06/22/2022 14:33   DG Chest Port 1 View  Result Date:  06/16/2022 CLINICAL DATA:  Acute respiratory failure with hypoxia and hypercapnia. Patient on vent. EXAM: PORTABLE CHEST 1 VIEW COMPARISON:  AP chest 06/15/2022 (multiple studies), chest two views 01/28/2020 FINDINGS: Endotracheal tube tip terminates approximately 6.3 cm above the carina, at the inferior aspect of the clavicular heads and similar to prior. Enteric tube descends below the diaphragm with the tip and side port overlying the left upper abdominal quadrant. Moderately decreased lung volumes. Right mid and lower lung horizontal linear densities, likely subsegmental atelectasis. No focal left lung airspace opacity. No definite pleural effusion. No pneumothorax. No acute skeletal abnormality. IMPRESSION: 1. Endotracheal tube tip terminates approximately 6.3 cm above the carina and similar to prior. 2. Enteric tube descends below the diaphragm with the tip and side port overlying the left upper abdominal quadrant. 3. Right mid and lower lung linear opacities are similar to prior, probable atelectasis. It is difficult to exclude mild pneumonia. Electronically Signed   By: Yvonne Kendall M.D.   On: 06/16/2022 09:02   DG CHEST PORT 1 VIEW  Result Date: 06/15/2022 CLINICAL DATA:  OG tube placement EXAM: PORTABLE CHEST 1 VIEW COMPARISON:  06/15/2022, 2:47 p.m. FINDINGS: Interval placement of esophagogastric tube, tip and side port below the diaphragm. Otherwise unchanged rotated AP portable examination with endotracheal tube over the midtrachea, as well as bilateral perihilar heterogeneous and consolidative airspace opacities. No new airspace opacity. IMPRESSION: 1. Interval placement of esophagogastric tube, tip and side port below the diaphragm. 2. Otherwise unchanged rotated AP portable examination with endotracheal tube over the midtrachea, as well as bilateral perihilar heterogeneous and consolidative airspace opacities. Electronically Signed   By: Delanna Ahmadi M.D.   On: 06/15/2022 18:15   DG Chest Port 1 View  Result Date: 06/15/2022 CLINICAL DATA:  ETT placement EXAM: PORTABLE CHEST 1 VIEW COMPARISON:  Chest x-ray dated July 18th 2021 FINDINGS: ETT tip is proximally 4.5 cm from the carina. Cardiac and mediastinal contours within normal limits. Left perihilar opacities and right mid lung linear opacities, likely due to atelectasis. No pleural effusion or pneumothorax. IMPRESSION: Left perihilar opacities and right mid lung linear opacities, likely due to atelectasis. Electronically Signed   By: Yetta Glassman M.D.   On: 06/15/2022 15:01   ECHOCARDIOGRAM COMPLETE  Result Date: 06/13/2022    ECHOCARDIOGRAM REPORT   Patient Name:   JAXSUN CIAMPI Date of Exam: 06/13/2022 Medical Rec #:  [id number]      Height:       70.0 in Accession #:    [id number]     Weight:       234.0 lb Date of Birth:  Jul 27, 1945      BSA:          2.231 m Patient Age:    46 years       BP:           98/51 mmHg Patient Gender: M              HR:           109 bpm. Exam Location:  Inpatient Procedure: 2D Echo, Color Doppler, Cardiac Doppler and Intracardiac            Opacification Agent Indications:     Atrial Fibrillation I48.91  History:        Patient has prior history of Echocardiogram examinations, most                 recent 01/30/2020. Aortic Valve Disease, Arrythmias:Atrial  Fibrillation; Risk Factors:Hypertension and Dyslipidemia. End                 stage renal disease. Severe sepsis.  Sonographer:    Darlina Sicilian RDCS Referring Phys: [phone] [facility]  Sonographer Comments: Suboptimal apical window, suboptimal subcostal window, suboptimal parasternal window and Technically difficult study due to poor echo windows. IMPRESSIONS  1. Left ventricular ejection fraction, by estimation, is 60 to 65%. The left ventricle has normal function. Left ventricular diastolic parameters are indeterminate.  2. Right ventricular systolic function is low normal. The right ventricular size is mildly enlarged. There is moderately elevated pulmonary artery systolic pressure.  3. The mitral valve is grossly normal. No evidence of mitral valve regurgitation. No evidence of mitral stenosis.  4. Tricuspid valve regurgitation is moderate.  5. Poor acoustic windows limit study Assessment of AV also limited by atrial fibrillation     AV is thickened, calcified Peak and mean gradients through the valve are 39 and 21 mm Hg Dimensionless index is 0.33     Overall ocnsistent with moderate AS. COmpared to echo report from 2021, mean gradient is increaed. The aortic valve is calcified. There is mild calcification of the aortic valve. Aortic valve regurgitation is not visualized. Moderate aortic valve stenosis. FINDINGS  Left Ventricle: Left ventricular ejection fraction, by estimation, is 60 to 65%. The left ventricle has normal function. Definity contrast agent was given IV to delineate the left ventricular endocardial borders. The left ventricular internal cavity size was normal in size. There is no left ventricular hypertrophy. Left ventricular diastolic parameters are indeterminate. Right Ventricle: The right  ventricular size is mildly enlarged. No increase in right ventricular wall thickness. Right ventricular systolic function is low normal. There is moderately elevated pulmonary artery systolic pressure. The tricuspid regurgitant  velocity is 3.30 m/s, and with an assumed right atrial pressure of 8 mmHg, the estimated right ventricular systolic pressure is 62.2 mmHg. Left Atrium: Left atrial size was normal in size. Right Atrium: Right atrial size was normal in size. Pericardium: There is no evidence of pericardial effusion. Mitral Valve: The mitral valve is grossly normal. No evidence of mitral valve regurgitation. No evidence of mitral valve stenosis. Tricuspid Valve: The tricuspid valve is normal in structure. Tricuspid valve regurgitation is moderate . No evidence of tricuspid stenosis. Aortic Valve: Poor acoustic windows limit study Assessment of AV also limited by atrial fibrillation AV is thickened, calcified Peak and mean gradients through the valve are 39 and 21 mm Hg Dimensionless index is 0.33 Overall ocnsistent with moderate AS. COmpared to echo report from 2021, mean gradient is increaed. The aortic valve is calcified. There is mild calcification of the aortic valve. Aortic valve regurgitation is not visualized. Moderate aortic stenosis is present. Aortic valve mean gradient measures 28.0 mmHg. Aortic valve peak gradient measures 44.6 mmHg. Aortic valve area, by VTI measures 1.03 cm. Pulmonic Valve: The pulmonic valve was grossly normal. Pulmonic valve regurgitation is not visualized. No evidence of pulmonic stenosis. Aorta: The aortic root and ascending aorta are structurally normal, with no evidence of dilitation. IAS/Shunts: No atrial level shunt detected by color flow Doppler.  LEFT VENTRICLE PLAX 2D LVIDd:         4.40 cm   Diastology LVIDs:         2.50 cm   LV e' medial:    10.55 cm/s LV PW:         0.90 cm   LV E/e' medial:  12.9 LV IVS:  0.90 cm   LV e' lateral:   13.05 cm/s LVOT diam:      2.00 cm   LV E/e' lateral: 10.5 LV SV:         65 LV SV Index:   29 LVOT Area:     3.14 cm  RIGHT VENTRICLE RV S prime:     13.20 cm/s TAPSE (M-mode): 2.2 cm LEFT ATRIUM           Index LA diam:      4.10 cm 1.84 cm/m LA Vol (A4C): 66.4 ml 29.76 ml/m  AORTIC VALVE AV Area (Vmax):    0.98 cm AV Area (Vmean):   1.03 cm AV Area (VTI):     1.03 cm AV Vmax:           334.00 cm/s AV Vmean:          254.000 cm/s AV VTI:            0.634 m AV Peak Grad:      44.6 mmHg AV Mean Grad:      28.0 mmHg LVOT Vmax:         104.00 cm/s LVOT Vmean:        83.500 cm/s LVOT VTI:          0.207 m LVOT/AV VTI ratio: 0.33  AORTA Ao Root diam: 3.40 cm Ao Asc diam:  3.70 cm MITRAL VALVE                TRICUSPID VALVE MV Area (PHT): 6.83 cm     TR Peak grad:   43.6 mmHg MV Decel Time: 111 msec     TR Vmax:        330.00 cm/s MV E velocity: 136.50 cm/s                             SHUNTS                             Systemic VTI:  0.21 m                             Systemic Diam: 2.00 cm Dorris Carnes MD Electronically signed by Dorris Carnes MD Signature Date/Time: 06/13/2022/2:38:06 PM    Final    CT PELVIS W CONTRAST  Result Date: 06/12/2022 CLINICAL DATA:  Left buttock abscess for 1 month EXAM: CT PELVIS WITH CONTRAST TECHNIQUE: Multidetector CT imaging of the pelvis was performed using the standard protocol following the bolus administration of intravenous contrast. RADIATION DOSE REDUCTION: This exam was performed according to the departmental dose-optimization program which includes automated exposure control, adjustment of the mA and/or kV according to patient size and/or use of iterative reconstruction technique. CONTRAST:  154m OMNIPAQUE IOHEXOL 300 MG/ML  SOLN COMPARISON:  05/05/2022, 05/27/2020 FINDINGS: Urinary Tract: Distal ureters are unremarkable. Bladder is decompressed, limiting its evaluation. Bowel: No bowel obstruction or ileus. Normal appendix right lower quadrant. No bowel wall thickening or inflammatory change.  Vascular/Lymphatic: Atherosclerosis of the aorta and its distal branches. No pathologically enlarged lymph nodes within the pelvis. Reproductive: Prostate is enlarged, measuring 5.5 x 4.3 cm. Fiduciary markers are seen within the prostate. Other: There is no free fluid or free intraperitoneal gas. Fat containing umbilical hernia is noted. No bowel herniation. Musculoskeletal: Multilocular gas and fluid collections are seen within the buttocks, consistent with  perianal abscesses. The largest gas and fluid collection within the right gluteal region measures 6.3 x 2.7 cm, reference image 50/2. This extends anteriorly into the perineum. More inferiorly within the buttocks are multilocular fluid collections, measuring up to 2.9 x 2.5 cm on the right and 3.8 x 2.5 cm on the left. I do not see any direct communication with the bowel lumen to suggest fistula. IMPRESSION: 1. Multilocular gas and fluid collection within the perianal region, extending from the perineum into the bilateral inferior buttocks, consistent with abscess. I do not see any communication between the gas and fluid collections and the bowel lumen. 2. Enlarged prostate. 3.  Aortic Atherosclerosis (ICD10-I70.0). Electronically Signed   By: Randa Ngo M.D.   On: 06/12/2022 20:01    Orson Eva, DO  [facility]  If 7PM-7AM, please contact night-coverage [email or web address] Password [email or web address] 06/25/2022, 5:45 PM   LOS: 13 days

## 2022-06-25 NOTE — Care Management Important Message (Signed)
Important Message  Patient Details  Name: Justin [name] MRN: [id number] Date of Birth: 10-Jun-1946   Medicare Important Message Given:  Yes     [name] 06/25/2022, 2:03 PM

## 2022-06-25 NOTE — Progress Notes (Signed)
Pt requestingimodium or pepto for diarrhea  second message sent to attending . No new orders at this time

## 2022-06-26 LAB — GLUCOSE, CAPILLARY
Glucose-Capillary: 119 mg/dL — ABNORMAL HIGH (ref 70–99)
Glucose-Capillary: 111 mg/dL — ABNORMAL HIGH (ref 70–99)

## 2022-06-26 MED ORDER — SUCROFERRIC OXYHYDROXIDE 500 MG PO CHEW
1000.0000 mg | CHEWABLE_TABLET | Freq: Three times a day (TID) | ORAL | Status: DC
  Filled 2022-06-26 (×10): qty 2

## 2022-06-26 MED ORDER — PREGABALIN 75 MG PO CAPS: 75.0000 mg | ORAL_CAPSULE | Freq: Two times a day (BID) | ORAL | 0 refills | Status: DC

## 2022-06-26 MED ORDER — AMIODARONE HCL 200 MG PO TABS: 200.0000 mg | ORAL_TABLET | Freq: Two times a day (BID) | ORAL | Status: DC

## 2022-06-26 MED ORDER — HYDROCODONE-ACETAMINOPHEN 5-325 MG PO TABS: 1.0000 | ORAL_TABLET | Freq: Four times a day (QID) | ORAL | 0 refills | Status: DC | PRN

## 2022-06-26 MED ORDER — METOPROLOL TARTRATE 25 MG PO TABS: 12.5000 mg | ORAL_TABLET | Freq: Two times a day (BID) | ORAL | Status: DC

## 2022-06-26 MED ORDER — PREGABALIN 75 MG PO CAPS: 75.0000 mg | ORAL_CAPSULE | Freq: Two times a day (BID) | ORAL | Status: DC

## 2022-06-26 NOTE — Progress Notes (Signed)
Patient ID: Justin [name], male   DOB: Sep 19, 1945, 76 y.o.   MRN: [id number] S: pt seen and examined on dialysis. Has some soreness in his feet otherwise no complaints. Tolerating treatment. Open for D/C to SNF this afternoon. Will be starting HD next Tues as an outpatient O:BP 130/80   Pulse 100   Temp 98.8 F (37.1 C) (Oral)   Resp (!) 22   Ht '5\' 10"'$  (1.778 m)   Wt 106 kg   SpO2 97%   BMI 33.53 kg/m   Intake/Output Summary (Last 24 hours) at 06/26/2022 1017 Last data filed at 06/25/2022 1700 Gross per 24 hour  Intake 720 ml  Output 1 ml  Net 719 ml   Intake/Output: I/O last 3 completed shifts: In: 1160 [P.O.:960; IV Piggyback:200] Out: 1 [Stool:1]  Intake/Output this shift:  No intake/output data recorded. Weight change: -3.6 kg Gen: NAD CVS: RRR, +systolic murmur Resp:CTA Abd: +BS, soft, NT/ND Ext: trace pretibial edema, LUE AVF +T/B, +1 edema LUE  Neuro: awake, alert  Recent Labs  Lab 06/20/22 0553 06/21/22 0342 06/22/22 0458 06/24/22 0412  NA 133* 132* 135 137  K 3.7 3.9 4.1 3.6  CL 95* 94* 97* 97*  CO2 '26 26 25 25  '$ GLUCOSE 87 65* 97 130*  BUN 39* 49* 58* 44*  CREATININE 6.73* 8.68* 10.39* 9.91*  ALBUMIN 2.2* 2.3* 2.4* 2.5*  CALCIUM 8.0* 8.6* 8.9 9.1  PHOS 4.4 6.0* 6.5* 6.7*   Liver Function Tests: Recent Labs  Lab 06/21/22 0342 06/22/22 0458 06/24/22 0412  ALBUMIN 2.3* 2.4* 2.5*   No results for input(s): "LIPASE", "AMYLASE" in the last 168 hours. No results for input(s): "AMMONIA" in the last 168 hours. CBC: Recent Labs  Lab 06/20/22 0553 06/21/22 0342 06/22/22 0458 06/23/22 0324 06/24/22 0412  WBC 16.7* 14.0* 15.0* 11.2* 11.2*  NEUTROABS 13.0* 10.8* 11.6*  --   --   HGB 8.9* 9.1* 9.3* 9.3* 9.1*  HCT 28.3* 28.7* 29.2* 28.9* 28.8*  MCV 96.6 97.0 96.7 96.0 97.6  PLT 351 342 404* 393 382   Cardiac Enzymes: No results for input(s): "CKTOTAL", "CKMB", "CKMBINDEX", "TROPONINI" in the last 168 hours. CBG: Recent Labs  Lab 06/25/22 1119  06/25/22 1632 06/25/22 2111 06/26/22 0326 06/26/22 0713  GLUCAP 133*  133* 166* 150* 119* 111*    Iron Studies: No results for input(s): "IRON", "TIBC", "TRANSFERRIN", "FERRITIN" in the last 72 hours. Studies/Results: No results found.  acetaminophen  650 mg Oral Q4H   Or   acetaminophen  650 mg Rectal Q4H   amiodarone  200 mg Oral BID   Chlorhexidine Gluconate Cloth  6 each Topical Daily   gabapentin  100 mg Oral QHS   insulin aspart  0-6 Units Subcutaneous TID AC & HS   metoprolol tartrate  12.5 mg Oral BID   pantoprazole  40 mg Oral QAC breakfast   sodium chloride flush  10-40 mL Intracatheter Q12H   sucroferric oxyhydroxide  500 mg Oral TID WC    BMET    Component Value Date/Time   NA 137 06/24/2022 0412   K 3.6 06/24/2022 0412   CL 97 (L) 06/24/2022 0412   CO2 25 06/24/2022 0412   GLUCOSE 130 (H) 06/24/2022 0412   BUN 44 (H) 06/24/2022 0412   CREATININE 9.91 (H) 06/24/2022 0412   CALCIUM 9.1 06/24/2022 0412   GFRNONAA 5 (L) 06/24/2022 0412   GFRAA 8 (L) 02/01/2020 0424   CBC    Component Value Date/Time   WBC 11.2 (  H) 06/24/2022 0412   RBC 2.95 (L) 06/24/2022 0412   HGB 9.1 (L) 06/24/2022 0412   HCT 28.8 (L) 06/24/2022 0412   PLT 382 06/24/2022 0412   MCV 97.6 06/24/2022 0412   MCH 30.8 06/24/2022 0412   MCHC 31.6 06/24/2022 0412   RDW 16.1 (H) 06/24/2022 0412   LYMPHSABS 1.5 06/22/2022 0458   MONOABS 1.3 (H) 06/22/2022 0458   EOSABS 0.2 06/22/2022 0458   BASOSABS 0.1 06/22/2022 0458    Assessment/Plan: 1. Severe sepsis/ perirectal abscess: s/p I and D 12/4 with gen surg.  Vanc/ Zosyn.  Off pressors now.  Remains on midodrine. Bp's have improved.  Will  be d/c'ed to SNF given deconditioning  2. ESRD:  [place].  MWF, using AVF.  HD on schedule 06/19/22, next 12/10. Issues with access BFR not > 227m/min and LUE edema.  Poor BFR and elevated venous pressures with HD but able to complete treatment.  Will keep HD on MWF schedule while here.   3.  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 Renal diet   5. Nutrition: renal diet with fluid restriction   6. S/p cardiac arrest- brief, in OR, ROSC achieved, Vtach   7.  Afib: on amio and OP AC  8.  Vascular access - was seen by Dr. EDonnetta Hutching12/12 who suspects central venous stenosis as cause of left arm swelling and increased venous pressures.  Mr. DBeauchaineprefers to have this worked up as an outpatient with his primary Nephrology team.  VGean Quint MD [facility]

## 2022-06-26 NOTE — Progress Notes (Signed)
Nsg Discharge Note  Admit Date:  06/12/2022 Discharge date: 06/26/2022   EINAR NOLASCO to be D/C'd Home per MD order.  AVS completed.  Copy for chart, and copy for patient signed, and dated. Patient/caregiver able to verbalize understanding.  Discharge Medication: Allergies as of 06/26/2022   No Known Allergies      Medication List     STOP taking these medications    carvedilol 25 MG tablet Commonly known as: COREG   cinacalcet 30 MG tablet Commonly known as: SENSIPAR       TAKE these medications    allopurinol 100 MG tablet Commonly known as: ZYLOPRIM Take 100 mg by mouth daily. What changed: Another medication with the same name was removed. Continue taking this medication, and follow the directions you see here.   amiodarone 200 MG tablet Commonly known as: PACERONE Take 1 tablet (200 mg total) by mouth 2 (two) times daily. On 07/09/22, start amiodarone 200 mg once daily What changed:  when to take this additional instructions   atorvastatin 40 MG tablet Commonly known as: LIPITOR Take 40 mg by mouth daily.   Eliquis 5 MG Tabs tablet Generic drug: apixaban Take 5 mg by mouth 2 (two) times daily.   HECTOROL IV Doxercalciferol (Hectorol)   HYDROcodone-acetaminophen 5-325 MG tablet Commonly known as: NORCO/VICODIN Take 1 tablet by mouth every 6 (six) hours as needed for severe pain.   Lokelma 10 g Pack packet Generic drug: sodium zirconium cyclosilicate Take 10 g by mouth 2 (two) times a week.   metoprolol tartrate 25 MG tablet Commonly known as: LOPRESSOR Take 0.5 tablets (12.5 mg total) by mouth 2 (two) times daily.   MIRCERA IJ Mircera   omeprazole 20 MG capsule Commonly known as: PRILOSEC Take 20 mg by mouth every morning.   pregabalin 75 MG capsule Commonly known as: LYRICA Take 1 capsule (75 mg total) by mouth 2 (two) times daily.   Velphoro 500 MG chewable tablet Generic drug: sucroferric oxyhydroxide Chew 500 mg by mouth 2 (two)  times daily.        Discharge Assessment: Vitals:   06/26/22 1330 06/26/22 1350  BP: 123/73 99/62  Pulse: 88   Resp: 20 18  Temp: 98.4 F (36.9 C) 98.7 F (37.1 C)  SpO2: 100% 99%   Skin clean, dry and intact without evidence of skin break down, no evidence of skin tears noted. IV catheter discontinued intact. Site without signs and symptoms of complications - no redness or edema noted at insertion site, patient denies c/o pain - only slight tenderness at site.  Dressing with slight pressure applied.  D/c Instructions-Education: Discharge instructions given to patient/family with verbalized understanding. D/c education completed with patient/family including follow up instructions, medication list, d/c activities limitations if indicated, with other d/c instructions as indicated by MD - patient able to verbalize understanding, all questions fully answered. Patient instructed to return to ED, call 911, or call MD for any changes in condition.  Patient escorted via [place], and D/C home via private auto.  Clovis Fredrickson, LPN 23/30/0762 2:63 PM

## 2022-06-26 NOTE — Progress Notes (Signed)
Report given to [facility] LPN at [facility].

## 2022-06-26 NOTE — Discharge Summary (Addendum)
Physician Discharge Summary   Patient: Justin [name] MRN: [id number] DOB: January 11, 1946  Admit date:     06/12/2022  Discharge date: 06/26/22  Discharge Physician: Shanon Brow Itzia Cunliffe   PCP: Michell Heinrich, DO   Recommendations at discharge:   Please follow up with primary care provider within 1-2 weeks  Please repeat BMP and CBC in one week     Hospital Course: 76 year old male with a history of ESRD (MWF) atrial fibrillation/atrial flutter, hypertension, hyperlipidemia, prediabetes, tobacco abuse in remission presenting with 1 month history of buttock pain, right greater than left.  Patient stated that it started as a boil that subsequently burst.  After testing, he began noticing worsening pain in his perianal region with sitting.  It has progressively gotten worse.  He denies any fevers, chills, chest pain, shortness of breath, coughing, hemoptysis, nausea, vomiting, direct abdominal pain.  He has not been on any recent antibiotics.  Because of his worsening pain, the patient presented for further evaluation and treatment.  He last had dialysis on 06/12/2022.  In the ED, the patient had a low-grade temperature 100.2 F.  He was initially hypotensive with a blood pressure of 86/43.  WBC 17.2, hemoglobin 10.8, platelets 199,000.  Sodium 140, potassium 3.5, bicarbonate 32, serum creatinine 5.32.  CT of the abdomen and pelvis showed a multiloculated/fluid collection and gas collection in the perianal region from the perineum to the inferior buttocks bilaterally.  The patient was started on vancomycin and Zosyn.  General surgery was consulted to assist with management.  The patient's apixaban was held in preparation for surgery.  Post op, the patient was left intubated, but was extubated after 24 hours.  He was weaned off levophed. He was seen by cardiology for NS Vtach for which he was started on IV amio which has been converted to po amio.  His operative cultures showed MDR Ecoli.  He was switched to cefepime  and vanc was discontinued.  He remained clinically stable for d/c and TOC assisted in transition to SNF.  Assessment and Plan:  Severe sepsis - resolved  -Secondary to perirectal abscess -Presented with tachycardia, leukocytosis, and lactate 2.7 -06/14/22 arterial line inserted -06/14/22  CVL inserted by Dr. Arnoldo Morale, plan to DC 12/9 if no further pressor needed -Continue Zosyn for now - nephrology team started midodrine 10 mg TID to assist with weaning norepinephrine - weaned off norepinephrine 12/9; if holds and no further need for pressors, femoral line removed 12/9.   - pt remains off norepinephrine and now off midodrine as of 12/12 -sepsis physiology resolved   Perirectal abscess -General surgery consulted -restart apixaban now that central line removed -Echocardiogram EF 60-65%, low normal RVF, mod TR -I&D on 06/15/2022>>MDR E coli -surgery starting hydrogen peroxide irrigation treatments -remains on Zosyn with plan to transition to cefepime 12/11 with HD -discussed length of therapy with surgery and plan is to treat for 1 more week for total of 2 weeks from procedure with cefepime with dialysis -last dose cefepime on HD 12/15 which will remain in system till next HD -this will complete 14 days abx beyond the day of I&D  -He will follow up in office with Dr. Curly Rim Complex tachycardia -?Vtach -Wide complex tachcyardia with thready pulse, received 15 seconds of CPR. Another short 5 second episode while in OR -appreciate cardiology--discussed with Dr. Harl Bowie -on amiodarone 200 mg BID x 3 weeks followed by 200 mg daily (12/28) per cardiology recs    Acute respiratory Failure with hypoxia -  left intubated post-op -personally reviewed CXR 12/4--no edema or infiltrates -Dr. Melvyn Novas consulted -06/16/22--extubated -weaned to 3L [employer] with 100% sat, wean oxygen as able  - now stable on RA   Pleuritic Chest Pain - resolved  - from frequent deep coughing - pain is reproducible with  light palpation the chest wall - scheduled acetaminophen for anti-inflammatory treatment, change to PRN dosing 12/10 - cough syrup as needed.    Diabetes mellitus type 2, controlled 06/16/22 A1C--6.5 -novolog sliding scale -Hypoglycemia from insulin - DC basal and prandial coverage  -was not on any agents prior to admission -CBGs now largely stable   Headache / right facial pain - possibly tension headache - acetaminophen ordered - check CT head 12/11  - neuro exam is unremarkable    ESRD -He dialyzes on Monday Wednesday Friday prior to this admission -due to his SNF logistics, he will be TTS while he is at [facility] -Nephrology consulted for next dialysis -Continue Velphoro and Sensipar -HD managed by nephrology service -HD on 06/26/22 in hospital done -discussed with nephrology, Dr. Corrin Parker to initiate HD on 06/30/22 in [place]   Swollen left extremity - discussed with nephrologist Dr. Johnney Ou - they plan to see how he does during HD today to decide if inpt vascular testing needed vs outpt -seen by vascular surgery, Dr. Kandis Fantasia is having adequate dialysis currently and he feels that it would be appropriate to defer fistulogram/intervention until after discharge.  Pt prefers to have in done in [place]   Atrial flutter -restarted apixaban 12/10 -treated with amiodarone gtt and on 12/7 transitioned to oral amiodarone -cardiology recommending amiodarone 200 mg BID x 3 weeks followed by 200 mg daily   Mixed hyperlipidemia -Continue statin   Essential hypertension -Holding metoprolol secondary to hypotension intially -restarting low dose metoprolol   Obesity -BMI 33.58 -lifestyle modification     Pain control - [place] Controlled Substance Reporting System database was reviewed. and patient was instructed, not to drive, operate heavy machinery, perform activities at heights, swimming or participation in water activities or provide baby-sitting services while on  Pain, Sleep and Anxiety Medications; until their outpatient Physician has advised to do so again. Also recommended to not to take more than prescribed Pain, Sleep and Anxiety Medications.  Consultants: general surgery, renal Procedures performed: I&D peri-rectal abscess  Disposition: Skilled nursing facility Diet recommendation:  Renal diet DISCHARGE MEDICATION: Allergies as of 06/26/2022   No Known Allergies      Medication List     STOP taking these medications    carvedilol 25 MG tablet Commonly known as: COREG   cinacalcet 30 MG tablet Commonly known as: SENSIPAR       TAKE these medications    allopurinol 100 MG tablet Commonly known as: ZYLOPRIM Take 100 mg by mouth daily. What changed: Another medication with the same name was removed. Continue taking this medication, and follow the directions you see here.   amiodarone 200 MG tablet Commonly known as: PACERONE Take 1 tablet (200 mg total) by mouth 2 (two) times daily. On 07/09/22, start amiodarone 200 mg once daily What changed:  when to take this additional instructions   atorvastatin 40 MG tablet Commonly known as: LIPITOR Take 40 mg by mouth daily.   Eliquis 5 MG Tabs tablet Generic drug: apixaban Take 5 mg by mouth 2 (two) times daily.   HECTOROL IV Doxercalciferol (Hectorol)   HYDROcodone-acetaminophen 5-325 MG tablet Commonly known as: NORCO/VICODIN Take 1 tablet by mouth every 6 (six) hours  as needed for severe pain.   Lokelma 10 g Pack packet Generic drug: sodium zirconium cyclosilicate Take 10 g by mouth 2 (two) times a week.   metoprolol tartrate 25 MG tablet Commonly known as: LOPRESSOR Take 0.5 tablets (12.5 mg total) by mouth 2 (two) times daily.   MIRCERA IJ Mircera   omeprazole 20 MG capsule Commonly known as: PRILOSEC Take 20 mg by mouth every morning.   pregabalin 75 MG capsule Commonly known as: LYRICA Take 1 capsule (75 mg total) by mouth 2 (two) times daily.    Velphoro 500 MG chewable tablet Generic drug: sucroferric oxyhydroxide Chew 500 mg by mouth 2 (two) times daily.        Follow-up Information     Hegland, Arliss Journey, MD. Schedule an appointment as soon as possible for a visit in 2 week(s).   Specialty: Cardiology Contact information: [facility] [address] [place] [address] [phone]                Discharge Exam: Danley Danker Weights   06/25/22 0500 06/26/22 0329 06/26/22 1016  Weight: 107.6 kg 106.1 kg 106 kg   HEENT:  [place]/AT, No thrush, no icterus CV:  RRR, no rub, no S3, no S4 Lung:  CTA, no wheeze, no rhonchi Abd:  soft/+BS, NT Ext:  No edema, no lymphangitis, no synovitis, no rash   Condition at discharge: stable  The results of significant diagnostics from this hospitalization (including imaging, microbiology, ancillary and laboratory) are listed below for reference.   Imaging Studies: CT HEAD WO CONTRAST (5MM)  Result Date: 06/22/2022 CLINICAL DATA:  Stroke suspected. Headache. Right-sided facial pain. EXAM: CT HEAD WITHOUT CONTRAST TECHNIQUE: Contiguous axial images were obtained from the base of the skull through the vertex without intravenous contrast. RADIATION DOSE REDUCTION: This exam was performed according to the departmental dose-optimization program which includes automated exposure control, adjustment of the mA and/or kV according to patient size and/or use of iterative reconstruction technique. COMPARISON:  CT Brain 04/19/21 FINDINGS: Brain: No evidence of acute infarction, hemorrhage, hydrocephalus, extra-axial collection or mass lesion/mass effect. Sequela of mild chronic microvascular ischemic change. Vascular: No hyperdense vessel or unexpected calcification. Skull: Normal. Negative for fracture or focal lesion. Sinuses/Orbits: Bilateral lens replacement. Paranasal sinuses and mastoid air cells are clear. Other: Nonspecific soft tissue thickening along the midline suboccipital  scalp, similar to slightly more conspicuous (series 100, image 28). Correlate with physical exam. IMPRESSION: 1. No acute intracranial abnormality. 2. Nonspecific soft tissue thickening along the midline suboccipital scalp. Correlate with physical exam. Electronically Signed   By: Marin Roberts M.D.   On: 06/22/2022 14:33   DG Chest Port 1 View  Result Date: 06/16/2022 CLINICAL DATA:  Acute respiratory failure with hypoxia and hypercapnia. Patient on vent. EXAM: PORTABLE CHEST 1 VIEW COMPARISON:  AP chest 06/15/2022 (multiple studies), chest two views 01/28/2020 FINDINGS: Endotracheal tube tip terminates approximately 6.3 cm above the carina, at the inferior aspect of the clavicular heads and similar to prior. Enteric tube descends below the diaphragm with the tip and side port overlying the left upper abdominal quadrant. Moderately decreased lung volumes. Right mid and lower lung horizontal linear densities, likely subsegmental atelectasis. No focal left lung airspace opacity. No definite pleural effusion. No pneumothorax. No acute skeletal abnormality. IMPRESSION: 1. Endotracheal tube tip terminates approximately 6.3 cm above the carina and similar to prior. 2. Enteric tube descends below the diaphragm with the tip and side port overlying the left upper abdominal  quadrant. 3. Right mid and lower lung linear opacities are similar to prior, probable atelectasis. It is difficult to exclude mild pneumonia. Electronically Signed   By: Yvonne Kendall M.D.   On: 06/16/2022 09:02   DG CHEST PORT 1 VIEW  Result Date: 06/15/2022 CLINICAL DATA:  OG tube placement EXAM: PORTABLE CHEST 1 VIEW COMPARISON:  06/15/2022, 2:47 p.m. FINDINGS: Interval placement of esophagogastric tube, tip and side port below the diaphragm. Otherwise unchanged rotated AP portable examination with endotracheal tube over the midtrachea, as well as bilateral perihilar heterogeneous and consolidative airspace opacities. No new airspace opacity.  IMPRESSION: 1. Interval placement of esophagogastric tube, tip and side port below the diaphragm. 2. Otherwise unchanged rotated AP portable examination with endotracheal tube over the midtrachea, as well as bilateral perihilar heterogeneous and consolidative airspace opacities. Electronically Signed   By: Delanna Ahmadi M.D.   On: 06/15/2022 18:15   DG Chest Port 1 View  Result Date: 06/15/2022 CLINICAL DATA:  ETT placement EXAM: PORTABLE CHEST 1 VIEW COMPARISON:  Chest x-ray dated July 18th 2021 FINDINGS: ETT tip is proximally 4.5 cm from the carina. Cardiac and mediastinal contours within normal limits. Left perihilar opacities and right mid lung linear opacities, likely due to atelectasis. No pleural effusion or pneumothorax. IMPRESSION: Left perihilar opacities and right mid lung linear opacities, likely due to atelectasis. Electronically Signed   By: Yetta Glassman M.D.   On: 06/15/2022 15:01   ECHOCARDIOGRAM COMPLETE  Result Date: 06/13/2022    ECHOCARDIOGRAM REPORT   Patient Name:   GILFORD LARDIZABAL Date of Exam: 06/13/2022 Medical Rec #:  [id number]      Height:       70.0 in Accession #:    [id number]     Weight:       234.0 lb Date of Birth:  1946/03/24      BSA:          2.231 m Patient Age:    65 years       BP:           98/51 mmHg Patient Gender: M              HR:           109 bpm. Exam Location:  Inpatient Procedure: 2D Echo, Color Doppler, Cardiac Doppler and Intracardiac            Opacification Agent Indications:    Atrial Fibrillation I48.91  History:        Patient has prior history of Echocardiogram examinations, most                 recent 01/30/2020. Aortic Valve Disease, Arrythmias:Atrial                 Fibrillation; Risk Factors:Hypertension and Dyslipidemia. End                 stage renal disease. Severe sepsis.  Sonographer:    Darlina Sicilian RDCS Referring Phys: [phone] [facility]  Sonographer Comments: Suboptimal apical window, suboptimal subcostal window, suboptimal parasternal  window and Technically difficult study due to poor echo windows. IMPRESSIONS  1. Left ventricular ejection fraction, by estimation, is 60 to 65%. The left ventricle has normal function. Left ventricular diastolic parameters are indeterminate.  2. Right ventricular systolic function is low normal. The right ventricular size is mildly enlarged. There is moderately elevated pulmonary artery systolic pressure.  3. The mitral valve is grossly normal. No evidence of mitral valve regurgitation.  No evidence of mitral stenosis.  4. Tricuspid valve regurgitation is moderate.  5. Poor acoustic windows limit study Assessment of AV also limited by atrial fibrillation     AV is thickened, calcified Peak and mean gradients through the valve are 39 and 21 mm Hg Dimensionless index is 0.33     Overall ocnsistent with moderate AS. COmpared to echo report from 2021, mean gradient is increaed. The aortic valve is calcified. There is mild calcification of the aortic valve. Aortic valve regurgitation is not visualized. Moderate aortic valve stenosis. FINDINGS  Left Ventricle: Left ventricular ejection fraction, by estimation, is 60 to 65%. The left ventricle has normal function. Definity contrast agent was given IV to delineate the left ventricular endocardial borders. The left ventricular internal cavity size was normal in size. There is no left ventricular hypertrophy. Left ventricular diastolic parameters are indeterminate. Right Ventricle: The right ventricular size is mildly enlarged. No increase in right ventricular wall thickness. Right ventricular systolic function is low normal. There is moderately elevated pulmonary artery systolic pressure. The tricuspid regurgitant  velocity is 3.30 m/s, and with an assumed right atrial pressure of 8 mmHg, the estimated right ventricular systolic pressure is 57.8 mmHg. Left Atrium: Left atrial size was normal in size. Right Atrium: Right atrial size was normal in size. Pericardium: There is  no evidence of pericardial effusion. Mitral Valve: The mitral valve is grossly normal. No evidence of mitral valve regurgitation. No evidence of mitral valve stenosis. Tricuspid Valve: The tricuspid valve is normal in structure. Tricuspid valve regurgitation is moderate . No evidence of tricuspid stenosis. Aortic Valve: Poor acoustic windows limit study Assessment of AV also limited by atrial fibrillation AV is thickened, calcified Peak and mean gradients through the valve are 39 and 21 mm Hg Dimensionless index is 0.33 Overall ocnsistent with moderate AS. COmpared to echo report from 2021, mean gradient is increaed. The aortic valve is calcified. There is mild calcification of the aortic valve. Aortic valve regurgitation is not visualized. Moderate aortic stenosis is present. Aortic valve mean gradient measures 28.0 mmHg. Aortic valve peak gradient measures 44.6 mmHg. Aortic valve area, by VTI measures 1.03 cm. Pulmonic Valve: The pulmonic valve was grossly normal. Pulmonic valve regurgitation is not visualized. No evidence of pulmonic stenosis. Aorta: The aortic root and ascending aorta are structurally normal, with no evidence of dilitation. IAS/Shunts: No atrial level shunt detected by color flow Doppler.  LEFT VENTRICLE PLAX 2D LVIDd:         4.40 cm   Diastology LVIDs:         2.50 cm   LV e' medial:    10.55 cm/s LV PW:         0.90 cm   LV E/e' medial:  12.9 LV IVS:        0.90 cm   LV e' lateral:   13.05 cm/s LVOT diam:     2.00 cm   LV E/e' lateral: 10.5 LV SV:         65 LV SV Index:   29 LVOT Area:     3.14 cm  RIGHT VENTRICLE RV S prime:     13.20 cm/s TAPSE (M-mode): 2.2 cm LEFT ATRIUM           Index LA diam:      4.10 cm 1.84 cm/m LA Vol (A4C): 66.4 ml 29.76 ml/m  AORTIC VALVE AV Area (Vmax):    0.98 cm AV Area (Vmean):   1.03 cm AV Area (  VTI):     1.03 cm AV Vmax:           334.00 cm/s AV Vmean:          254.000 cm/s AV VTI:            0.634 m AV Peak Grad:      44.6 mmHg AV Mean Grad:       28.0 mmHg LVOT Vmax:         104.00 cm/s LVOT Vmean:        83.500 cm/s LVOT VTI:          0.207 m LVOT/AV VTI ratio: 0.33  AORTA Ao Root diam: 3.40 cm Ao Asc diam:  3.70 cm MITRAL VALVE                TRICUSPID VALVE MV Area (PHT): 6.83 cm     TR Peak grad:   43.6 mmHg MV Decel Time: 111 msec     TR Vmax:        330.00 cm/s MV E velocity: 136.50 cm/s                             SHUNTS                             Systemic VTI:  0.21 m                             Systemic Diam: 2.00 cm Dorris Carnes MD Electronically signed by Dorris Carnes MD Signature Date/Time: 06/13/2022/2:38:06 PM    Final    CT PELVIS W CONTRAST  Result Date: 06/12/2022 CLINICAL DATA:  Left buttock abscess for 1 month EXAM: CT PELVIS WITH CONTRAST TECHNIQUE: Multidetector CT imaging of the pelvis was performed using the standard protocol following the bolus administration of intravenous contrast. RADIATION DOSE REDUCTION: This exam was performed according to the departmental dose-optimization program which includes automated exposure control, adjustment of the mA and/or kV according to patient size and/or use of iterative reconstruction technique. CONTRAST:  123m OMNIPAQUE IOHEXOL 300 MG/ML  SOLN COMPARISON:  05/05/2022, 05/27/2020 FINDINGS: Urinary Tract: Distal ureters are unremarkable. Bladder is decompressed, limiting its evaluation. Bowel: No bowel obstruction or ileus. Normal appendix right lower quadrant. No bowel wall thickening or inflammatory change. Vascular/Lymphatic: Atherosclerosis of the aorta and its distal branches. No pathologically enlarged lymph nodes within the pelvis. Reproductive: Prostate is enlarged, measuring 5.5 x 4.3 cm. Fiduciary markers are seen within the prostate. Other: There is no free fluid or free intraperitoneal gas. Fat containing umbilical hernia is noted. No bowel herniation. Musculoskeletal: Multilocular gas and fluid collections are seen within the buttocks, consistent with perianal abscesses. The  largest gas and fluid collection within the right gluteal region measures 6.3 x 2.7 cm, reference image 50/2. This extends anteriorly into the perineum. More inferiorly within the buttocks are multilocular fluid collections, measuring up to 2.9 x 2.5 cm on the right and 3.8 x 2.5 cm on the left. I do not see any direct communication with the bowel lumen to suggest fistula. IMPRESSION: 1. Multilocular gas and fluid collection within the perianal region, extending from the perineum into the bilateral inferior buttocks, consistent with abscess. I do not see any communication between the gas and fluid collections and the bowel lumen. 2. Enlarged prostate. 3.  Aortic Atherosclerosis (ICD10-I70.0). Electronically Signed  By: Randa Ngo M.D.   On: 06/12/2022 20:01    Microbiology: Results for orders placed or performed during the hospital encounter of 06/12/22  MRSA Next Gen by PCR, Nasal     Status: None   Collection Time: 06/13/22  1:30 AM   Specimen: Nasal Mucosa; Nasal Swab  Result Value Ref Range Status   MRSA by PCR Next Gen NOT DETECTED NOT DETECTED Final    Comment: (NOTE) The GeneXpert MRSA Assay (FDA approved for NASAL specimens only), is one component of a comprehensive MRSA colonization surveillance program. It is not intended to diagnose MRSA infection nor to guide or monitor treatment for MRSA infections. Test performance is not FDA approved in patients less than 73 years old. Performed at [facility], [address] [place], [place] [address]   Aerobic/Anaerobic Culture w Gram Stain (surgical/deep wound)     Status: None   Collection Time: 06/15/22  1:44 PM   Specimen: PATH Other; Tissue  Result Value Ref Range Status   Specimen Description ABSCESS  Final   Special Requests PERIRECTAL  Final   Gram Stain   Final    FEW WBC PRESENT, PREDOMINANTLY PMN RARE GRAM POSITIVE COCCI IN CLUSTERS RARE GRAM POSITIVE RODS    Culture   Final    FEW ESCHERICHIA COLI NO ANAEROBES  ISOLATED Performed at [facility], [address] [place], [place] [address]    Report Status 06/20/2022 FINAL  Final   Organism ID, Bacteria ESCHERICHIA COLI  Final      Susceptibility   Escherichia coli - MIC*    AMPICILLIN >=32 RESISTANT Resistant     CEFAZOLIN >=64 RESISTANT Resistant     CEFEPIME <=0.12 SENSITIVE Sensitive     CEFTAZIDIME 4 SENSITIVE Sensitive     CEFTRIAXONE <=0.25 SENSITIVE Sensitive     CIPROFLOXACIN >=4 RESISTANT Resistant     GENTAMICIN <=1 SENSITIVE Sensitive     IMIPENEM <=0.25 SENSITIVE Sensitive     TRIMETH/SULFA >=320 RESISTANT Resistant     AMPICILLIN/SULBACTAM >=32 RESISTANT Resistant     PIP/TAZO <=4 SENSITIVE Sensitive     * FEW ESCHERICHIA COLI    Labs: CBC: Recent Labs  Lab 06/20/22 0553 06/21/22 0342 06/22/22 0458 06/23/22 0324 06/24/22 0412  WBC 16.7* 14.0* 15.0* 11.2* 11.2*  NEUTROABS 13.0* 10.8* 11.6*  --   --   HGB 8.9* 9.1* 9.3* 9.3* 9.1*  HCT 28.3* 28.7* 29.2* 28.9* 28.8*  MCV 96.6 97.0 96.7 96.0 97.6  PLT 351 342 404* 393 563   Basic Metabolic Panel: Recent Labs  Lab 06/20/22 0553 06/21/22 0342 06/22/22 0458 06/24/22 0412  NA 133* 132* 135 137  K 3.7 3.9 4.1 3.6  CL 95* 94* 97* 97*  CO2 '26 26 25 25  '$ GLUCOSE 87 65* 97 130*  BUN 39* 49* 58* 44*  CREATININE 6.73* 8.68* 10.39* 9.91*  CALCIUM 8.0* 8.6* 8.9 9.1  PHOS 4.4 6.0* 6.5* 6.7*   Liver Function Tests: Recent Labs  Lab 06/20/22 0553 06/21/22 0342 06/22/22 0458 06/24/22 0412  ALBUMIN 2.2* 2.3* 2.4* 2.5*   CBG: Recent Labs  Lab 06/25/22 1119 06/25/22 1632 06/25/22 2111 06/26/22 0326 06/26/22 0713  GLUCAP 133*  133* 166* 150* 119* 111*    Discharge time spent: greater than 30 minutes.  Signed: Orson Eva, MD [facility] 06/26/2022

## 2022-06-26 NOTE — Progress Notes (Signed)
Transition of Care (TOC) -30 day Note       Patient Details  Name: Justin [name] Date of Birth: 07-02-1946   Transition of Care [facility]) CM/SW Contact  Name: Shade Flood Phone Number: [phone] Date: 06/26/2022 Time: 1202   MUST ID:    To Whom it May Concern:   Please be advised that the above patient will require a short-term nursing home stay, anticipated 30 days or less rehabilitation and strengthening. The plan is for return home.

## 2022-06-26 NOTE — TOC Transition Note (Signed)
Transition of Care [facility]) - CM/SW Discharge Note   Patient Details  Name: PLES TRUDEL MRN: [id number] Date of Birth: March 26, 1946  Transition of Care [facility]) CM/SW Contact:  Shade Flood, LCSW Phone Number: 06/26/2022, 2:25 PM   Clinical Narrative:     Pt stable for dc today per MD. Updated Bryson Ha at [facility] and they can accept pt today. [facility] has pt scheduled to start Tuesday 12/19 at 1230. Snf aware.   DC clinical sent electronically. RN to call report. [facility] transport arranged.   No other TOC needs for dc.  Final next level of care: Skilled Nursing Facility Barriers to Discharge: Barriers Resolved   Patient Goals and CMS Choice Patient states their goals for this hospitalization and ongoing recovery are:: rehab CMS Medicare.gov Compare Post Acute Care list provided to:: Patient Choice offered to / list presented to : Patient  Discharge Placement              Patient chooses bed at: [facility] Patient to be transferred to facility by: [facility] Name of family member notified: pt only Patient and family notified of of transfer: 06/26/22  Discharge Plan and Services In-house Referral: Clinical Social Work   Post Acute Care Choice: [place]                               Social Determinants of Health (SDOH) Interventions     Readmission Risk Interventions    06/17/2022    2:47 PM  Readmission Risk Prevention Plan  Transportation Screening Complete  HRI or [place] Complete  Social Work Consult for [place] Planning/Counseling Complete  Palliative Care Screening Not Applicable  Medication Review Press [employer]) Complete

## 2022-06-26 NOTE — Progress Notes (Signed)
Received patient in bed to unit.  Alert and oriented.  Informed consent signed and in chart.   Treatment initiated: 1000 Treatment completed: 1330  Patient tolerated well.  Transported back to the room  Alert, without acute distress.  Hand-off given to patient's nurse.   Access used: AVF Access issues: none  Total UF removed: 2.6 L Medication(s) given: none Post HD VS: 123/73 P 88 R 20. O2 sat 100%  in room air. Post HD weight: 103.4 Kg   [facility]

## 2022-08-18 ENCOUNTER — Other Ambulatory Visit: Payer: Self-pay

## 2022-08-18 ENCOUNTER — Encounter (HOSPITAL_COMMUNITY): Payer: Self-pay

## 2022-08-18 ENCOUNTER — Emergency Department (HOSPITAL_COMMUNITY)
Admission: EM | Admit: 2022-08-18 | Discharge: 2022-08-19 | Disposition: A | Discharge status: Home / Self Care | Payer: Medicare Other | Attending: Emergency Medicine | Admitting: Emergency Medicine

## 2022-08-18 DIAGNOSIS — R531 Weakness: Secondary | ICD-10-CM | POA: Insufficient documentation

## 2022-08-18 DIAGNOSIS — E119 Type 2 diabetes mellitus without complications: Secondary | ICD-10-CM | POA: Insufficient documentation

## 2022-08-18 DIAGNOSIS — Z7901 Long term (current) use of anticoagulants: Secondary | ICD-10-CM | POA: Insufficient documentation

## 2022-08-18 DIAGNOSIS — R2241 Localized swelling, mass and lump, right lower limb: Secondary | ICD-10-CM | POA: Diagnosis not present

## 2022-08-18 DIAGNOSIS — R197 Diarrhea, unspecified: Secondary | ICD-10-CM | POA: Diagnosis not present

## 2022-08-18 DIAGNOSIS — R1032 Left lower quadrant pain: Secondary | ICD-10-CM

## 2022-08-18 DIAGNOSIS — K8020 Calculus of gallbladder without cholecystitis without obstruction: Secondary | ICD-10-CM | POA: Diagnosis not present

## 2022-08-18 DIAGNOSIS — N186 End stage renal disease: Secondary | ICD-10-CM | POA: Insufficient documentation

## 2022-08-18 MED ORDER — ONDANSETRON 4 MG PO TBDP
8.0000 mg | ORAL_TABLET | Freq: Once | ORAL | Status: AC
  Administered 2022-08-19: 8 mg via ORAL
  Filled 2022-08-18: qty 2

## 2022-08-18 NOTE — ED Provider Notes (Incomplete)
[facility] Provider Note   CSN: [id number] Arrival date & time: 08/18/22  2219     History {Add pertinent medical, surgical, social history, OB history to HPI:1} Chief Complaint  Patient presents with  . Weakness   *Wife was present to assist in history  Justin [name] is a 77 y.o. male history of end-stage renal disease, diabetes, on Eliquis who presented with weakness.  Patient states he had diarrhea for the past month.  Patient stated that he was recently in the hospital receiving antibiotics for a Perry rectal abscess.  Patient states his stools have been black and watery however they would like the past 5 years due to medication is given to him before dialysis.  Patient states today he started having nausea and nonbloody emesis but stated if you were given food or water he would be able to keep it down.  Patient states he has epigastric pain when palpated.  Patient tried Pepto-Bismol to no relief.  Patient denied recent travel, new foods.  Patient denies chest pain, shortness of breath, changes in sensation/motor skills, dizziness, syncope, hematochezia  Home Medications Prior to Admission medications   Medication Sig Start Date End Date Taking? Authorizing Provider  allopurinol (ZYLOPRIM) 100 MG tablet Take 100 mg by mouth daily. 09/13/21   [provider]  amiodarone (PACERONE) 200 MG tablet Take 1 tablet (200 mg total) by mouth 2 (two) times daily. On 07/09/22, start amiodarone 200 mg once daily 06/26/22   Tat, Shanon Brow, MD  atorvastatin (LIPITOR) 40 MG tablet Take 40 mg by mouth daily.  10/30/14   [provider]  Doxercalciferol (HECTOROL IV) Doxercalciferol (Hectorol) 08/25/21 08/24/22  [provider]  ELIQUIS 5 MG TABS tablet Take 5 mg by mouth 2 (two) times daily. 10/07/19   [provider]  HYDROcodone-acetaminophen (NORCO/VICODIN) 5-325 MG tablet Take 1 tablet by mouth every 6 (six) hours as needed for  severe pain. 06/26/22   Orson Eva, MD  LOKELMA 10 g PACK packet Take 10 g by mouth 2 (two) times a week. 06/08/22   [provider]  Methoxy PEG-Epoetin Beta (MIRCERA IJ) Mircera 06/13/21 12/18/22  [provider]  metoprolol tartrate (LOPRESSOR) 25 MG tablet Take 0.5 tablets (12.5 mg total) by mouth 2 (two) times daily. 06/26/22   Orson Eva, MD  omeprazole (PRILOSEC) 20 MG capsule Take 20 mg by mouth every morning.     [provider]  pregabalin (LYRICA) 75 MG capsule Take 1 capsule (75 mg total) by mouth 2 (two) times daily. 06/26/22   Orson Eva, MD  VELPHORO 500 MG chewable tablet Chew 500 mg by mouth 2 (two) times daily. 06/03/22   [provider]      Allergies    Patient has no known allergies.    Review of Systems   Review of Systems  Neurological:  Positive for weakness.  See HPI  Physical Exam Updated Vital Signs BP 118/61   Pulse 64   Temp (!) 97.4 F (36.3 C) (Oral)   Resp 18   SpO2 93%  Physical Exam Vitals and nursing note reviewed.  Constitutional:      General: He is not in acute distress.    Appearance: He is well-developed.  HENT:     Head: Normocephalic and atraumatic.  Eyes:     Conjunctiva/sclera: Conjunctivae normal.     Pupils: Pupils are equal, round, and reactive to light.  Cardiovascular:     Rate and Rhythm:  Normal rate and regular rhythm.     Heart sounds: No murmur heard. Pulmonary:     Effort: Pulmonary effort is normal. No respiratory distress.     Breath sounds: Normal breath sounds.  Abdominal:     Palpations: Abdomen is soft.     Tenderness: There is abdominal tenderness (Epigastric). There is no right CVA tenderness, left CVA tenderness, guarding (epigastric) or rebound.  Musculoskeletal:        General: No swelling. Normal range of motion.     Cervical back: Neck supple.     Right lower leg: Edema (1+ pitting edema) present.     Comments: Left foot is in a boot from a previous diabetic ulcer  surgery  Skin:    General: Skin is warm and dry.     Capillary Refill: Capillary refill takes less than 2 seconds.  Neurological:     General: No focal deficit present.     Mental Status: He is alert and oriented to person, place, and time.  Psychiatric:        Mood and Affect: Mood normal.     ED Results / Procedures / Treatments   Labs (all labs ordered are listed, but only abnormal results are displayed) Labs Reviewed  C DIFFICILE QUICK SCREEN W PCR REFLEX    COMPREHENSIVE METABOLIC PANEL  LIPASE, BLOOD  CBC WITH DIFFERENTIAL/PLATELET  URINALYSIS, ROUTINE W REFLEX MICROSCOPIC    EKG None  Radiology No results found.  Procedures Procedures  {Document cardiac monitor, telemetry assessment procedure when appropriate:1}  Medications Ordered in ED Medications  ondansetron (ZOFRAN-ODT) disintegrating tablet 8 mg (has no administration in time range)    ED Course/ Medical Decision Making/ A&P   {   Click here for ABCD2, HEART and other calculatorsREFRESH Note before signing :1}                          Medical Decision Making  Justin [name] 77 y.o. presented today for weakness. Working DDx that I considered at this time includes, but not limited to, acute gastroenteritis, pancreatitis, C diff, AAA, pyelonephritis, nephrolithiasis.  Review of prior external notes: 06/26/22 discharge summary  Unique Tests and My Interpretation:  C diff Quick Screen:Pending CBC w diff:Pending UA: Pending Lipase:Pending KWI:[id number] EKG: Prolonged QT 474/501 with prolonged PR interval  Discussion with Independent Historian: Wife  Discussion of Management of Tests: none  Risk: Cannot be determined at this time  Risk Stratification Score: none  Staffed with Vanita Panda, MD  R/o DDx: Cannot be determined at this time  Plan: Patient presented with diarrhea has been around for a month and nausea vomiting that began today.  Patient appears stable in the room and basic labs were  ordered.  Patient endorsed recent use of antibiotics and so C. difficile was ordered as well.  Patient will be given 1 dose of Zofran for nausea relief.  CT abdomen was ordered to evaluate for AAA and pancreatitis. Patient was signed out to oncoming team at [place].   {Document critical care time when appropriate:1} {Document review of labs and clinical decision tools ie heart score, Chads2Vasc2 etc:1}  {Document your independent review of radiology images, and any outside records:1} {Document your discussion with family members, caretakers, and with consultants:1} {Document social determinants of health affecting pt's care:1} {Document your decision making why or why not admission, treatments were needed:1} Final Clinical Impression(s) / ED Diagnoses Final diagnoses:  None    Rx / DC Orders  ED Discharge Orders     None

## 2022-08-18 NOTE — ED Triage Notes (Signed)
Pt from home by EMS, reports weakness after dialysis today with n/v.

## 2022-08-18 NOTE — ED Provider Notes (Addendum)
[facility] Provider Note   CSN: [id number] Arrival date & time: 08/18/22  2219     History  Chief Complaint  Patient presents with   Weakness    [name] is a 77 y.o. male history of end-stage renal disease, diabetes, on Eliquis who presented with abd pain.  Patient states he had diarrhea for the past month.  Patient stated that he was recently in the hospital receiving antibiotics for a Perry rectal abscess.  Patient states his stools have been black and watery however they would like the past 5 years due to medication is given to him before dialysis.  Patient states today he started having nausea and nonbloody emesis but stated if you were given food or water he would be able to keep it down.  Patient states he has epigastric pain when palpated. Patient is not able to urinate. Patient tried Pepto-Bismol to no relief.  Patient denied recent travel, new foods.  Patient denies chest pain, shortness of breath, changes in sensation/motor skills, dizziness, syncope, hematochezia  Home Medications Prior to Admission medications   Medication Sig Start Date End Date Taking? Authorizing Provider  allopurinol (ZYLOPRIM) 100 MG tablet Take 100 mg by mouth daily. 09/13/21   [provider]  amiodarone (PACERONE) 200 MG tablet Take 1 tablet (200 mg total) by mouth 2 (two) times daily. On 07/09/22, start amiodarone 200 mg once daily 06/26/22   Tat, Shanon Brow, MD  atorvastatin (LIPITOR) 40 MG tablet Take 40 mg by mouth daily.  10/30/14   [provider]  Doxercalciferol (HECTOROL IV) Doxercalciferol (Hectorol) 08/25/21 08/24/22  [provider]  ELIQUIS 5 MG TABS tablet Take 5 mg by mouth 2 (two) times daily. 10/07/19   [provider]  HYDROcodone-acetaminophen (NORCO/VICODIN) 5-325 MG tablet Take 1 tablet by mouth every 6 (six) hours as needed for severe pain. 06/26/22   Orson Eva, MD  LOKELMA 10 g PACK packet Take 10 g by  mouth 2 (two) times a week. 06/08/22   [provider]  Methoxy PEG-Epoetin Beta (MIRCERA IJ) Mircera 06/13/21 12/18/22  [provider]  metoprolol tartrate (LOPRESSOR) 25 MG tablet Take 0.5 tablets (12.5 mg total) by mouth 2 (two) times daily. 06/26/22   Orson Eva, MD  omeprazole (PRILOSEC) 20 MG capsule Take 20 mg by mouth every morning.     [provider]  pregabalin (LYRICA) 75 MG capsule Take 1 capsule (75 mg total) by mouth 2 (two) times daily. 06/26/22   Orson Eva, MD  VELPHORO 500 MG chewable tablet Chew 500 mg by mouth 2 (two) times daily. 06/03/22   [provider]      Allergies    Patient has no known allergies.    Review of Systems   Review of Systems  Neurological:  Positive for weakness.  See HPI  Physical Exam Updated Vital Signs BP 118/61   Pulse 64   Temp (!) 97.4 F (36.3 C) (Oral)   Resp 18   SpO2 93%  Physical Exam Vitals and nursing note reviewed.  Constitutional:      General: He is not in acute distress.    Appearance: He is well-developed.  HENT:     Head: Normocephalic and atraumatic.  Eyes:     Conjunctiva/sclera: Conjunctivae normal.     Pupils: Pupils are equal, round, and reactive to light.  Cardiovascular:     Rate and Rhythm: Normal rate and regular rhythm.     Heart  sounds: No murmur heard. Pulmonary:     Effort: Pulmonary effort is normal. No respiratory distress.     Breath sounds: Normal breath sounds.  Abdominal:     Palpations: Abdomen is soft.     Tenderness: There is abdominal tenderness (Epigastric). There is no right CVA tenderness, left CVA tenderness, guarding (epigastric) or rebound.  Musculoskeletal:        General: No swelling. Normal range of motion.     Cervical back: Neck supple.     Right lower leg: Edema (1+ pitting edema) present.     Comments: Left foot is in a boot from a previous diabetic ulcer surgery  Skin:    General: Skin is warm and dry.     Capillary Refill: Capillary  refill takes less than 2 seconds.  Neurological:     General: No focal deficit present.     Mental Status: He is alert and oriented to person, place, and time.  Psychiatric:        Mood and Affect: Mood normal.     ED Results / Procedures / Treatments   Labs (all labs ordered are listed, but only abnormal results are displayed) Labs Reviewed  C DIFFICILE QUICK SCREEN W PCR REFLEX    COMPREHENSIVE METABOLIC PANEL  LIPASE, BLOOD  CBC WITH DIFFERENTIAL/PLATELET  URINALYSIS, ROUTINE W REFLEX MICROSCOPIC    EKG None  Radiology No results found.  Procedures Procedures    Medications Ordered in ED Medications  ondansetron (ZOFRAN-ODT) disintegrating tablet 8 mg (has no administration in time range)    ED Course/ Medical Decision Making/ A&P                             Medical Decision Making Amount and/or Complexity of Data Reviewed Labs: ordered. Radiology: ordered.  Risk Prescription drug management.   Justin [name] 77 y.o. presented today for weakness. Working DDx that I considered at this time includes, but not limited to, acute gastroenteritis, pancreatitis, C diff, AAA, pyelonephritis, nephrolithiasis.  Review of prior external notes: 06/26/22 discharge summary  Unique Tests and My Interpretation:  C diff Quick Screen:Pending CBC w diff:Pending UA: Pending Lipase:Pending LOV:FIEPPIR EKG: Prolonged QT 474/501 with prolonged PR interval  Discussion of Management of Tests: none  Risk: Cannot be determined at this time  Risk Stratification Score: none  Staffed with Vanita Panda, MD  R/o DDx: Cannot be determined at this time  Plan: Patient presented with diarrhea has been around for a month and nausea vomiting that began today.  Patient appears stable in the room and basic labs were ordered.  Patient endorsed recent use of antibiotics and so C. difficile was ordered as well.  Patient will be given 1 dose of Zofran for nausea relief.  CT abdomen was  ordered to evaluate for AAA and pancreatitis. Patient was signed out to oncoming team at [place].         Final Clinical Impression(s) / ED Diagnoses Final diagnoses:  None    Rx / DC Orders ED Discharge Orders     None         Elvina Sidle 08/19/22 0010    Chuck Hint, PA-C 08/19/22 0012    Carmin Muskrat, MD 08/19/22 [phone]

## 2022-08-18 NOTE — ED Notes (Signed)
Pt reports he has chronic diarrhea for over a months and he is demanding meds right now for it.

## 2022-08-19 ENCOUNTER — Emergency Department (HOSPITAL_COMMUNITY): Payer: Medicare Other

## 2022-08-19 LAB — COMPREHENSIVE METABOLIC PANEL
ALT: 12 U/L (ref 0–44)
AST: 25 U/L (ref 15–41)
Albumin: 3.2 g/dL — ABNORMAL LOW (ref 3.5–5.0)
Alkaline Phosphatase: 120 U/L (ref 38–126)
Anion gap: 11 (ref 5–15)
BUN: 20 mg/dL (ref 8–23)
CO2: 27 mmol/L (ref 22–32)
Calcium: 7.9 mg/dL — ABNORMAL LOW (ref 8.9–10.3)
Chloride: 97 mmol/L — ABNORMAL LOW (ref 98–111)
Creatinine, Ser: 5.39 mg/dL — ABNORMAL HIGH (ref 0.61–1.24)
GFR, Estimated: 10 mL/min — ABNORMAL LOW (ref >60)
Glucose, Bld: 185 mg/dL — ABNORMAL HIGH (ref 70–99)
Potassium: 4.1 mmol/L (ref 3.5–5.1)
Sodium: 135 mmol/L (ref 135–145)
Total Bilirubin: 0.5 mg/dL (ref 0.3–1.2)
Total Protein: 7.4 g/dL (ref 6.5–8.1)

## 2022-08-19 LAB — CBC WITH DIFFERENTIAL/PLATELET
Abs Immature Granulocytes: 0.03 10*3/uL (ref 0.00–0.07)
Basophils Absolute: 0.1 10*3/uL (ref 0.0–0.1)
Basophils Relative: 1 %
Eosinophils Absolute: 0.2 10*3/uL (ref 0.0–0.5)
Eosinophils Relative: 2 %
HCT: 29.1 % — ABNORMAL LOW (ref 39.0–52.0)
Hemoglobin: 8.9 g/dL — ABNORMAL LOW (ref 13.0–17.0)
Immature Granulocytes: 0 %
Lymphocytes Relative: 21 %
Lymphs Abs: 1.8 10*3/uL (ref 0.7–4.0)
MCH: 31.1 pg (ref 26.0–34.0)
MCHC: 30.6 g/dL (ref 30.0–36.0)
MCV: 101.7 fL — ABNORMAL HIGH (ref 80.0–100.0)
Monocytes Absolute: 0.8 10*3/uL (ref 0.1–1.0)
Monocytes Relative: 9 %
Neutro Abs: 5.5 10*3/uL (ref 1.7–7.7)
Neutrophils Relative %: 67 %
Platelets: 184 10*3/uL (ref 150–400)
RBC: 2.86 MIL/uL — ABNORMAL LOW (ref 4.22–5.81)
RDW: 15 % (ref 11.5–15.5)
WBC: 8.3 10*3/uL (ref 4.0–10.5)
nRBC: 0 % (ref 0.0–0.2)

## 2022-08-19 LAB — LIPASE, BLOOD: Lipase: 75 U/L — ABNORMAL HIGH (ref 11–51)

## 2022-08-19 MED ORDER — IOHEXOL 350 MG/ML SOLN
75.0000 mL | Freq: Once | INTRAVENOUS | Status: AC | PRN
  Administered 2022-08-19: 75 mL via INTRAVENOUS

## 2022-08-19 NOTE — Discharge Instructions (Signed)
Follow-up with your primary care provider for recheck.  If diarrhea continues they can send a stool study.  Turn to ER for worsening or concerning symptoms.

## 2022-08-19 NOTE — ED Notes (Signed)
Pt has been sleeping, waiting on CT, wife at beside. No n/v/d

## 2022-08-19 NOTE — ED Provider Notes (Signed)
77 yo male with ESRD, dialysis today in full then developed weakness and abd pain. Diarrhea (3 + water/day) x 1 month, vomiting today. On Eliquis, no bloody stool or emesis. Admitted 1 month ago for perirectal abscess. Foul smelling stools. C diff studies sent. CT a/p for abd pain  No fevers, vitals stable. Pending labs and CT.  Physical Exam  BP (!) 105/48   Pulse 64   Temp (!) 97.4 F (36.3 C) (Oral)   Resp 18   SpO2 93%   Physical Exam  Procedures  Procedures  ED Course / MDM    Medical Decision Making Amount and/or Complexity of Data Reviewed Labs: ordered. Radiology: ordered.  Risk Prescription drug management.   CT with cholelithiasis without acute cholecystitis, LFTs normal.  Lipase minimally elevated. Patient has not had any further diarrhea while in the emergency room.  Discussed follow-up with primary care provider, can obtain stool study at that time if diarrhea continues.       Tacy Learn, PA-C 08/19/22 0802    Merrily Pew, MD 08/19/22 [phone]

## 2022-09-10 ENCOUNTER — Emergency Department (HOSPITAL_COMMUNITY): Payer: Medicare Other

## 2022-09-10 ENCOUNTER — Inpatient Hospital Stay (HOSPITAL_COMMUNITY)
Admission: EM | Admit: 2022-09-10 | Discharge: 2022-09-12 | DRG: 393 | Disposition: A | Discharge status: Home / Self Care | Payer: Medicare Other | Attending: Internal Medicine | Admitting: Internal Medicine

## 2022-09-10 ENCOUNTER — Other Ambulatory Visit: Payer: Self-pay

## 2022-09-10 ENCOUNTER — Encounter (HOSPITAL_COMMUNITY): Payer: Self-pay

## 2022-09-10 DIAGNOSIS — K635 Polyp of colon: Secondary | ICD-10-CM | POA: Diagnosis present

## 2022-09-10 DIAGNOSIS — D123 Benign neoplasm of transverse colon: Secondary | ICD-10-CM | POA: Diagnosis present

## 2022-09-10 DIAGNOSIS — I472 Ventricular tachycardia, unspecified: Secondary | ICD-10-CM | POA: Diagnosis present

## 2022-09-10 DIAGNOSIS — N189 Chronic kidney disease, unspecified: Secondary | ICD-10-CM | POA: Diagnosis not present

## 2022-09-10 DIAGNOSIS — K627 Radiation proctitis: Secondary | ICD-10-CM | POA: Diagnosis not present

## 2022-09-10 DIAGNOSIS — K921 Melena: Secondary | ICD-10-CM | POA: Diagnosis not present

## 2022-09-10 DIAGNOSIS — K449 Diaphragmatic hernia without obstruction or gangrene: Secondary | ICD-10-CM | POA: Diagnosis present

## 2022-09-10 DIAGNOSIS — R0989 Other specified symptoms and signs involving the circulatory and respiratory systems: Secondary | ICD-10-CM | POA: Diagnosis present

## 2022-09-10 DIAGNOSIS — I4891 Unspecified atrial fibrillation: Secondary | ICD-10-CM

## 2022-09-10 DIAGNOSIS — I10 Essential (primary) hypertension: Secondary | ICD-10-CM | POA: Diagnosis not present

## 2022-09-10 DIAGNOSIS — K611 Rectal abscess: Secondary | ICD-10-CM | POA: Diagnosis present

## 2022-09-10 DIAGNOSIS — E119 Type 2 diabetes mellitus without complications: Secondary | ICD-10-CM

## 2022-09-10 DIAGNOSIS — Z6833 Body mass index (BMI) 33.0-33.9, adult: Secondary | ICD-10-CM

## 2022-09-10 DIAGNOSIS — C61 Malignant neoplasm of prostate: Secondary | ICD-10-CM

## 2022-09-10 DIAGNOSIS — E1140 Type 2 diabetes mellitus with diabetic neuropathy, unspecified: Secondary | ICD-10-CM | POA: Diagnosis present

## 2022-09-10 DIAGNOSIS — E1122 Type 2 diabetes mellitus with diabetic chronic kidney disease: Secondary | ICD-10-CM | POA: Diagnosis not present

## 2022-09-10 DIAGNOSIS — Z87891 Personal history of nicotine dependence: Secondary | ICD-10-CM

## 2022-09-10 DIAGNOSIS — Z841 Family history of disorders of kidney and ureter: Secondary | ICD-10-CM

## 2022-09-10 DIAGNOSIS — E113511 Type 2 diabetes mellitus with proliferative diabetic retinopathy with macular edema, right eye: Secondary | ICD-10-CM | POA: Diagnosis present

## 2022-09-10 DIAGNOSIS — I480 Paroxysmal atrial fibrillation: Secondary | ICD-10-CM | POA: Diagnosis present

## 2022-09-10 DIAGNOSIS — I959 Hypotension, unspecified: Secondary | ICD-10-CM | POA: Diagnosis not present

## 2022-09-10 DIAGNOSIS — K317 Polyp of stomach and duodenum: Secondary | ICD-10-CM | POA: Diagnosis present

## 2022-09-10 DIAGNOSIS — N2581 Secondary hyperparathyroidism of renal origin: Secondary | ICD-10-CM | POA: Diagnosis present

## 2022-09-10 DIAGNOSIS — D6832 Hemorrhagic disorder due to extrinsic circulating anticoagulants: Secondary | ICD-10-CM | POA: Diagnosis present

## 2022-09-10 DIAGNOSIS — K5900 Constipation, unspecified: Secondary | ICD-10-CM | POA: Diagnosis present

## 2022-09-10 DIAGNOSIS — Z992 Dependence on renal dialysis: Secondary | ICD-10-CM

## 2022-09-10 DIAGNOSIS — Z79899 Other long term (current) drug therapy: Secondary | ICD-10-CM

## 2022-09-10 DIAGNOSIS — Z7901 Long term (current) use of anticoagulants: Secondary | ICD-10-CM

## 2022-09-10 DIAGNOSIS — Z811 Family history of alcohol abuse and dependence: Secondary | ICD-10-CM

## 2022-09-10 DIAGNOSIS — Z8546 Personal history of malignant neoplasm of prostate: Secondary | ICD-10-CM

## 2022-09-10 DIAGNOSIS — I4892 Unspecified atrial flutter: Secondary | ICD-10-CM | POA: Diagnosis present

## 2022-09-10 DIAGNOSIS — K219 Gastro-esophageal reflux disease without esophagitis: Secondary | ICD-10-CM | POA: Diagnosis present

## 2022-09-10 DIAGNOSIS — R197 Diarrhea, unspecified: Secondary | ICD-10-CM | POA: Diagnosis present

## 2022-09-10 DIAGNOSIS — K922 Gastrointestinal hemorrhage, unspecified: Secondary | ICD-10-CM | POA: Diagnosis not present

## 2022-09-10 DIAGNOSIS — N186 End stage renal disease: Secondary | ICD-10-CM

## 2022-09-10 DIAGNOSIS — M109 Gout, unspecified: Secondary | ICD-10-CM | POA: Diagnosis present

## 2022-09-10 DIAGNOSIS — D62 Acute posthemorrhagic anemia: Secondary | ICD-10-CM | POA: Diagnosis present

## 2022-09-10 DIAGNOSIS — D631 Anemia in chronic kidney disease: Secondary | ICD-10-CM | POA: Diagnosis present

## 2022-09-10 DIAGNOSIS — E669 Obesity, unspecified: Secondary | ICD-10-CM | POA: Diagnosis present

## 2022-09-10 DIAGNOSIS — E782 Mixed hyperlipidemia: Secondary | ICD-10-CM | POA: Diagnosis present

## 2022-09-10 DIAGNOSIS — E1159 Type 2 diabetes mellitus with other circulatory complications: Secondary | ICD-10-CM

## 2022-09-10 DIAGNOSIS — Z6372 Alcoholism and drug addiction in family: Secondary | ICD-10-CM

## 2022-09-10 DIAGNOSIS — I120 Hypertensive chronic kidney disease with stage 5 chronic kidney disease or end stage renal disease: Secondary | ICD-10-CM | POA: Diagnosis present

## 2022-09-10 DIAGNOSIS — T45515A Adverse effect of anticoagulants, initial encounter: Secondary | ICD-10-CM | POA: Diagnosis present

## 2022-09-10 DIAGNOSIS — Y842 Radiological procedure and radiotherapy as the cause of abnormal reaction of the patient, or of later complication, without mention of misadventure at the time of the procedure: Secondary | ICD-10-CM | POA: Diagnosis present

## 2022-09-10 DIAGNOSIS — Z923 Personal history of irradiation: Secondary | ICD-10-CM

## 2022-09-10 HISTORY — DX: Sepsis, unspecified organism: A41.9

## 2022-09-10 HISTORY — DX: Sepsis, unspecified organism: R65.21

## 2022-09-10 LAB — COMPREHENSIVE METABOLIC PANEL
ALT: 13 U/L (ref 0–44)
AST: 34 U/L (ref 15–41)
Albumin: 2.9 g/dL — ABNORMAL LOW (ref 3.5–5.0)
Alkaline Phosphatase: 89 U/L (ref 38–126)
Anion gap: 15 (ref 5–15)
BUN: 20 mg/dL (ref 8–23)
CO2: 28 mmol/L (ref 22–32)
Calcium: 8.1 mg/dL — ABNORMAL LOW (ref 8.9–10.3)
Chloride: 98 mmol/L (ref 98–111)
Creatinine, Ser: 6.1 mg/dL — ABNORMAL HIGH (ref 0.61–1.24)
GFR, Estimated: 9 mL/min — ABNORMAL LOW (ref >60)
Glucose, Bld: 118 mg/dL — ABNORMAL HIGH (ref 70–99)
Potassium: 3.8 mmol/L (ref 3.5–5.1)
Sodium: 141 mmol/L (ref 135–145)
Total Bilirubin: 0.6 mg/dL (ref 0.3–1.2)
Total Protein: 7.1 g/dL (ref 6.5–8.1)

## 2022-09-10 LAB — CBC WITH DIFFERENTIAL/PLATELET
Abs Immature Granulocytes: 0.04 10*3/uL (ref 0.00–0.07)
Basophils Absolute: 0.1 10*3/uL (ref 0.0–0.1)
Basophils Relative: 1 %
Eosinophils Absolute: 0.2 10*3/uL (ref 0.0–0.5)
Eosinophils Relative: 3 %
HCT: 27.3 % — ABNORMAL LOW (ref 39.0–52.0)
Hemoglobin: 8.2 g/dL — ABNORMAL LOW (ref 13.0–17.0)
Immature Granulocytes: 1 %
Lymphocytes Relative: 26 %
Lymphs Abs: 1.9 10*3/uL (ref 0.7–4.0)
MCH: 31.5 pg (ref 26.0–34.0)
MCHC: 30 g/dL (ref 30.0–36.0)
MCV: 105 fL — ABNORMAL HIGH (ref 80.0–100.0)
Monocytes Absolute: 1 10*3/uL (ref 0.1–1.0)
Monocytes Relative: 13 %
Neutro Abs: 4.1 10*3/uL (ref 1.7–7.7)
Neutrophils Relative %: 56 %
Platelets: 193 10*3/uL (ref 150–400)
RBC: 2.6 MIL/uL — ABNORMAL LOW (ref 4.22–5.81)
RDW: 16.4 % — ABNORMAL HIGH (ref 11.5–15.5)
WBC: 7.2 10*3/uL (ref 4.0–10.5)
nRBC: 0 % (ref 0.0–0.2)

## 2022-09-10 LAB — I-STAT CHEM 8, ED
BUN: 21 mg/dL (ref 8–23)
Calcium, Ion: 1.03 mmol/L — ABNORMAL LOW (ref 1.15–1.40)
Chloride: 98 mmol/L (ref 98–111)
Creatinine, Ser: 7.1 mg/dL — ABNORMAL HIGH (ref 0.61–1.24)
Glucose, Bld: 110 mg/dL — ABNORMAL HIGH (ref 70–99)
HCT: 29 % — ABNORMAL LOW (ref 39.0–52.0)
Hemoglobin: 9.9 g/dL — ABNORMAL LOW (ref 13.0–17.0)
Potassium: 3.3 mmol/L — ABNORMAL LOW (ref 3.5–5.1)
Sodium: 141 mmol/L (ref 135–145)
TCO2: 33 mmol/L — ABNORMAL HIGH (ref 22–32)

## 2022-09-10 LAB — HEMOGLOBIN AND HEMATOCRIT, BLOOD
HCT: 26.9 % — ABNORMAL LOW (ref 39.0–52.0)
Hemoglobin: 7.8 g/dL — ABNORMAL LOW (ref 13.0–17.0)

## 2022-09-10 LAB — PROTIME-INR
INR: 1.3 — ABNORMAL HIGH (ref 0.8–1.2)
Prothrombin Time: 16.2 seconds — ABNORMAL HIGH (ref 11.4–15.2)

## 2022-09-10 MED ORDER — SUCROFERRIC OXYHYDROXIDE 500 MG PO CHEW
500.0000 mg | CHEWABLE_TABLET | Freq: Two times a day (BID) | ORAL | Status: DC
  Administered 2022-09-12: 500 mg via ORAL
  Filled 2022-09-10 (×4): qty 1

## 2022-09-10 MED ORDER — POTASSIUM CHLORIDE CRYS ER 20 MEQ PO TBCR: 40.0000 meq | EXTENDED_RELEASE_TABLET | Freq: Once | ORAL | Status: DC

## 2022-09-10 MED ORDER — PEG-KCL-NACL-NASULF-NA ASC-C 100 G PO SOLR: 1.0000 | Freq: Once | ORAL | Status: DC

## 2022-09-10 MED ORDER — PREGABALIN 75 MG PO CAPS
75.0000 mg | ORAL_CAPSULE | Freq: Two times a day (BID) | ORAL | Status: DC
  Administered 2022-09-10: 75 mg via ORAL
  Filled 2022-09-10: qty 3

## 2022-09-10 MED ORDER — ALLOPURINOL 100 MG PO TABS
100.0000 mg | ORAL_TABLET | Freq: Every day | ORAL | Status: DC
  Administered 2022-09-12: 100 mg via ORAL
  Filled 2022-09-10: qty 1

## 2022-09-10 MED ORDER — SODIUM CHLORIDE 0.9% FLUSH
3.0000 mL | Freq: Two times a day (BID) | INTRAVENOUS | Status: DC
  Administered 2022-09-10 – 2022-09-12 (×3): 3 mL via INTRAVENOUS

## 2022-09-10 MED ORDER — IOHEXOL 350 MG/ML SOLN
75.0000 mL | Freq: Once | INTRAVENOUS | Status: AC | PRN
  Administered 2022-09-10: 75 mL via INTRAVENOUS

## 2022-09-10 MED ORDER — ACETAMINOPHEN 650 MG RE SUPP: 650.0000 mg | Freq: Four times a day (QID) | RECTAL | Status: DC | PRN

## 2022-09-10 MED ORDER — SODIUM CHLORIDE 0.9 % IV SOLN: INTRAVENOUS | Status: DC

## 2022-09-10 MED ORDER — ATORVASTATIN CALCIUM 40 MG PO TABS
40.0000 mg | ORAL_TABLET | Freq: Every day | ORAL | Status: DC
  Administered 2022-09-12: 40 mg via ORAL
  Filled 2022-09-10: qty 1

## 2022-09-10 MED ORDER — ACETAMINOPHEN 325 MG PO TABS: 650.0000 mg | ORAL_TABLET | Freq: Four times a day (QID) | ORAL | Status: DC | PRN

## 2022-09-10 MED ORDER — PEG-KCL-NACL-NASULF-NA ASC-C 100 G PO SOLR
0.5000 | Freq: Once | ORAL | Status: AC
  Administered 2022-09-10: 100 g via ORAL
  Filled 2022-09-10: qty 1

## 2022-09-10 MED ORDER — POLYETHYLENE GLYCOL 3350 17 G PO PACK: 17.0000 g | PACK | Freq: Every day | ORAL | Status: DC | PRN

## 2022-09-10 MED ORDER — BISACODYL 5 MG PO TBEC
10.0000 mg | DELAYED_RELEASE_TABLET | Freq: Once | ORAL | Status: AC
  Administered 2022-09-10: 10 mg via ORAL
  Filled 2022-09-10: qty 2

## 2022-09-10 MED ORDER — METOPROLOL TARTRATE 12.5 MG HALF TABLET
12.5000 mg | ORAL_TABLET | Freq: Two times a day (BID) | ORAL | Status: DC
  Administered 2022-09-10: 12.5 mg via ORAL
  Filled 2022-09-10: qty 1

## 2022-09-10 MED ORDER — AMIODARONE HCL 200 MG PO TABS
200.0000 mg | ORAL_TABLET | Freq: Every day | ORAL | Status: DC
  Administered 2022-09-12: 200 mg via ORAL
  Filled 2022-09-10: qty 1

## 2022-09-10 MED ORDER — PANTOPRAZOLE SODIUM 40 MG PO TBEC
40.0000 mg | DELAYED_RELEASE_TABLET | Freq: Every day | ORAL | Status: DC
  Administered 2022-09-12: 40 mg via ORAL
  Filled 2022-09-10: qty 1

## 2022-09-10 NOTE — ED Notes (Signed)
ED TO INPATIENT HANDOFF REPORT  ED Nurse Name and Phone #: Effie Shy Name/Age/Gender Justin [name] 77 y.o. male Room/Bed: 022C/022C  Code Status   Code Status: Full Code  Home/SNF/Other Home Patient oriented to: self, place, time, and situation Is this baseline? Yes      Chief Complaint GI bleed [K92.2]  Triage Note Pt bib EMS coming from home. Reports bright red rectal bleeding starting this morning. Denies pain, N/V, dizziness or weakness. On elequis daily. Blood saturated through pt pants on arrival.     Allergies No Known Allergies  Level of Care/Admitting Diagnosis ED Disposition     ED Disposition  Admit   Condition  --   Comment  Hospital Area: [place] [[id number]]  Level of Care: Telemetry Medical [104]  May place patient in observation at [facility] or [place] if equivalent level of care is available:: No  Covid Evaluation: Asymptomatic - no recent exposure (last 10 days) testing not required  Diagnosis: GI bleed [id number]  Admitting Physician: Marcelyn Bruins [id number]  Attending Physician: Marcelyn Bruins [id number]          B Medical/Surgery History Past Medical History:  Diagnosis Date   Acute respiratory failure with hypoxia and hypercapnia ([place]) 06/15/2022   Allergy, unspecified, initial encounter 10/10/2019   Anemia    Anemia in chronic kidney disease 11/20/2015   Cancer ([place]) 2009   Prostate; radiation seeds   Chronic kidney disease    stage 4   Diabetes mellitus    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 ([place]) 12/07/2019   Dialysis patient ([place])    Gout    Hypercalcemia 11/14/2021   Hyperkalemia 10/19/2018   Hypertension    Hypotension 06/13/2022   Right posterior capsular opacification 02/27/2021   Septic shock ([place])    Vitreous hemorrhage of right eye ([place]) 10/24/2019   Past Surgical History:  Procedure Laterality Date   A/V  FISTULAGRAM Left 08/17/2017   Procedure: A/V FISTULAGRAM;  Surgeon: Katha Cabal, MD;  Location: [facility];  Service: Cardiovascular;  Laterality: Left;   A/V SHUNT INTERVENTION N/A 08/17/2017   Procedure: A/V SHUNT INTERVENTION;  Surgeon: Katha Cabal, MD;  Location: [facility];  Service: Cardiovascular;  Laterality: N/A;   AV FISTULA PLACEMENT Left 05/31/2015   Procedure: ARTERIOVENOUS (AV) FISTULA CREATION;  Surgeon: Katha Cabal, MD;  Location: [facility];  Service: Vascular;  Laterality: Left;   excision bone spurs Bilateral 1989   feet   INCISION AND DRAINAGE PERIRECTAL ABSCESS N/A 06/15/2022   Procedure: IRRIGATION AND DEBRIDEMENT PERIRECTAL ABSCESS;  Surgeon: Aviva Signs, MD;  Location: [facility];  Service: General;  Laterality: N/A;   KNEE SURGERY Left 1998   arthroscopy   SHOULDER SURGERY Left 1994   rotator cuff     A IV Location/Drains/Wounds Patient Lines/Drains/Airways Status     Active Line/Drains/Airways     Name Placement date Placement time Site Days   Peripheral IV 09/10/22 20 G Right Antecubital 09/10/22  0820  Antecubital  less than 1   Fistula / Graft Left Upper arm Arteriovenous fistula --  --  Upper arm  --   Wound / Incision (Open or Dehisced) 01/29/20 Non-pressure wound Back Lower abscess 01/29/20  2303  Back  955   Wound / Incision (Open or Dehisced) 06/13/22 Non-pressure wound Buttocks Left abscess to inside of buttock 06/13/22  0228  Buttocks  89  Intake/Output Last 24 hours No intake or output data in the 24 hours ending 09/10/22 1317  Labs/Imaging Results for orders placed or performed during the hospital encounter of 09/10/22 (from the past 48 hour(s))  CBC with Differential     Status: Abnormal   Collection Time: 09/10/22  8:20 AM  Result Value Ref Range   WBC 7.2 4.0 - 10.5 K/uL   RBC 2.60 (L) 4.22 - 5.81 MIL/uL   Hemoglobin 8.2 (L) 13.0 - 17.0 g/dL   HCT 27.3 (L) 39.0 - 52.0 %   MCV 105.0 (H) 80.0  - 100.0 fL   MCH 31.5 26.0 - 34.0 pg   MCHC 30.0 30.0 - 36.0 g/dL   RDW 16.4 (H) 11.5 - 15.5 %   Platelets 193 150 - 400 K/uL   nRBC 0.0 0.0 - 0.2 %   Neutrophils Relative % 56 %   Neutro Abs 4.1 1.7 - 7.7 K/uL   Lymphocytes Relative 26 %   Lymphs Abs 1.9 0.7 - 4.0 K/uL   Monocytes Relative 13 %   Monocytes Absolute 1.0 0.1 - 1.0 K/uL   Eosinophils Relative 3 %   Eosinophils Absolute 0.2 0.0 - 0.5 K/uL   Basophils Relative 1 %   Basophils Absolute 0.1 0.0 - 0.1 K/uL   Immature Granulocytes 1 %   Abs Immature Granulocytes 0.04 0.00 - 0.07 K/uL    Comment: Performed at [facility], [address], [place], [place] [address]  Comprehensive metabolic panel     Status: Abnormal   Collection Time: 09/10/22  8:20 AM  Result Value Ref Range   Sodium 141 135 - 145 mmol/L   Potassium 3.8 3.5 - 5.1 mmol/L   Chloride 98 98 - 111 mmol/L   CO2 28 22 - 32 mmol/L   Glucose, Bld 118 (H) 70 - 99 mg/dL    Comment: Glucose reference range applies only to samples taken after fasting for at least 8 hours.   BUN 20 8 - 23 mg/dL   Creatinine, Ser 6.10 (H) 0.61 - 1.24 mg/dL   Calcium 8.1 (L) 8.9 - 10.3 mg/dL   Total Protein 7.1 6.5 - 8.1 g/dL   Albumin 2.9 (L) 3.5 - 5.0 g/dL   AST 34 15 - 41 U/L   ALT 13 0 - 44 U/L   Alkaline Phosphatase 89 38 - 126 U/L   Total Bilirubin 0.6 0.3 - 1.2 mg/dL   GFR, Estimated 9 (L) >60 mL/min    Comment: (NOTE) Calculated using the CKD-EPI Creatinine Equation (2021)    Anion gap 15 5 - 15    Comment: Performed at [facility], [place] [address], [place], [place] [address]  Type and screen [place]     Status: None   Collection Time: 09/10/22  8:20 AM  Result Value Ref Range   ABO/RH(D) O POS    Antibody Screen NEG    Sample Expiration      09/13/2022,2359 Performed at [facility], [place] [address], [place], [place] [address]   Protime-INR     Status: Abnormal   Collection Time: 09/10/22  8:20 AM  Result Value Ref Range    Prothrombin Time 16.2 (H) 11.4 - 15.2 seconds   INR 1.3 (H) 0.8 - 1.2    Comment: (NOTE) INR goal varies based on device and disease states. Performed at [facility], [place] [address] [place], [place] [address]   I-Stat Chem 8, ED  Status: Abnormal   Collection Time: 09/10/22  8:49 AM  Result Value Ref Range   Sodium 141 135 - 145 mmol/L   Potassium 3.3 (L) 3.5 - 5.1 mmol/L   Chloride 98 98 - 111 mmol/L   BUN 21 8 - 23 mg/dL   Creatinine, Ser 7.10 (H) 0.61 - 1.24 mg/dL   Glucose, Bld 110 (H) 70 - 99 mg/dL    Comment: Glucose reference range applies only to samples taken after fasting for at least 8 hours.   Calcium, Ion 1.03 (L) 1.15 - 1.40 mmol/L   TCO2 33 (H) 22 - 32 mmol/L   Hemoglobin 9.9 (L) 13.0 - 17.0 g/dL   HCT 29.0 (L) 39.0 - 52.0 %   DG Chest Portable 1 View  Result Date: 09/10/2022 CLINICAL DATA:  Provided history: Hypoxia. EXAM: PORTABLE CHEST 1 VIEW COMPARISON:  Prior chest radiographs 06/16/2022 and earlier. FINDINGS: Shallow inspiration radiograph, accentuating the cardiac silhouette and limiting evaluation of heart size. Aortic atherosclerosis. Bilateral perihilar and left lung base opacities, which may reflect atelectasis, edema and/or atypical/viral infection. No evidence of pleural effusion or pneumothorax. No acute osseous abnormality identified. Chronic widening of the left acromioclavicular joint. A vascular stent projects in the left subclavian/axillary region. IMPRESSION: Shallow inspiration radiograph. Bilateral perihilar and left lung base opacities which may reflect atelectasis, edema and/or atypical/viral infection. Electronically Signed   By: Kellie Simmering D.O.   On: 09/10/2022 12:17   CT ANGIO GI BLEED  Result Date: 09/10/2022 CLINICAL DATA:  Rectal bleeding EXAM: CTA ABDOMEN AND PELVIS WITHOUT AND WITH CONTRAST TECHNIQUE: Multidetector CT imaging of the abdomen and pelvis was performed using the standard protocol during bolus administration of  intravenous contrast. Multiplanar reconstructed images and MIPs were obtained and reviewed to evaluate the vascular anatomy. RADIATION DOSE REDUCTION: This exam was performed according to the departmental dose-optimization program which includes automated exposure control, adjustment of the mA and/or kV according to patient size and/or use of iterative reconstruction technique. CONTRAST:  75 mL OMNIPAQUE IOHEXOL 350 MG/ML SOLN COMPARISON:  08/19/2022 FINDINGS: VASCULAR Aorta: Dense atheromatous calcifications. No aneurysm or dissection. Celiac: Atheromatous changes no evidence of occlusion. SMA: Atheromatous changes no evidence of occlusion. Renals: Atheromatous changes no evidence of occlusion. IMA: Atheromatous changes no evidence of occlusion. Inflow: Patent without evidence of aneurysm, dissection, vasculitis or significant stenosis. Proximal Outflow: Bilateral common femoral and visualized portions of the superficial and profunda femoral arteries are patent without evidence of aneurysm, dissection, vasculitis or significant stenosis. Veins: No obvious venous abnormality within the limitations of this arterial phase study. Review of the MIP images confirms the above findings. NON-VASCULAR Lower chest: Dependent basilar subsegmental atelectasis. No pleural or pericardial effusion identified. There is bilateral gynecomastia. Hepatobiliary: Distended gallbladder with layering milk calcium or calcified stones. No hepatic parenchymal abnormalities or biliary ductal dilatation identified. Pancreas: Unremarkable. No pancreatic ductal dilatation or surrounding inflammatory changes. Spleen: Normal in size without focal abnormality. Adrenals/Urinary Tract: No adrenal lesions. Kidneys are atrophic. There are numerous centimeter-sized cystic lesions in both kidneys that do not need further imaging follow up. No hydronephrosis or nephrolithiasis identified. Urinary bladder empty. Stomach/Bowel: Stomach is within normal  limits. Appendix appears normal. No evidence of bowel wall thickening, distention, or inflammatory changes. Delayed postcontrast images demonstrate no evidence of extravasation of contrast indicate the presence of active GI hemorrhage. Lymphatic: No suspicious adenopathy identified. Reproductive: Prostate brachytherapy seeds noted. Prostate appears mildly prominent. Other: Small abdominal wall periumbilical defect containing omental fat without evidence of incarceration Musculoskeletal:  Diffuse osteoblastic changes with the differential including diffuse metastatic disease versus renal osteodystrophy. IMPRESSION: VASCULAR 1. Dense atheromatous changes noted for the aorta and its branches without evidence of significant occlusive changes, aneurysm or dissection. NON-VASCULAR 1. No evidence of extravasation of IV contrast to indicate the presence of active GI bleed. 2. Sequelae of end-stage renal disease. 3. Distended gallbladder with evidence of stones or sludge. 4. 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 5. Bilateral gynecomastia. Electronically Signed   By: Sammie Bench M.D.   On: 09/10/2022 11:20    Pending Labs Unresulted Labs (From admission, onward)     Start     Ordered   09/11/22 0500  Comprehensive metabolic panel  Tomorrow morning,   R        09/10/22 1314   09/11/22 0500  CBC  Tomorrow morning,   R        09/10/22 1314   09/10/22 1159  Hemoglobin and hematocrit, blood  Once,   STAT        09/10/22 1158            Vitals/Pain Today's Vitals   09/10/22 1140 09/10/22 1143 09/10/22 1200 09/10/22 1300  BP:   121/79 116/70  Pulse:    67  Resp:   15 15  Temp:      TempSrc:      SpO2: 94% 94%  99%    Isolation Precautions No active isolations  Medications Medications  allopurinol (ZYLOPRIM) tablet 100 mg (has no administration in time range)  amiodarone (PACERONE) tablet 200 mg (has no administration in time range)   atorvastatin (LIPITOR) tablet 40 mg (has no administration in time range)  metoprolol tartrate (LOPRESSOR) tablet 12.5 mg (has no administration in time range)  pantoprazole (PROTONIX) EC tablet 40 mg (has no administration in time range)  sodium chloride flush (NS) 0.9 % injection 3 mL (has no administration in time range)  acetaminophen (TYLENOL) tablet 650 mg (has no administration in time range)    Or  acetaminophen (TYLENOL) suppository 650 mg (has no administration in time range)  polyethylene glycol (MIRALAX / GLYCOLAX) packet 17 g (has no administration in time range)  iohexol (OMNIPAQUE) 350 MG/ML injection 75 mL (75 mLs Intravenous Contrast Given 09/10/22 1057)    Mobility walks with device     Focused Assessments    R Recommendations: See Admitting Provider Note  Report given to:   Additional Notes:

## 2022-09-10 NOTE — ED Triage Notes (Signed)
Pt bib EMS coming from home. Reports bright red rectal bleeding starting this morning. Denies pain, N/V, dizziness or weakness. On elequis daily. Blood saturated through pt pants on arrival.

## 2022-09-10 NOTE — ED Notes (Signed)
Brief changed. Dark red bowel movement

## 2022-09-10 NOTE — H&P (View-Only) (Signed)
Referring Provider: Dr. Neva Seat  Primary Care Physician:  Michell Heinrich, DO Primary Gastroenterologist:  Althia Forts   Reason for Consultation: Hematochezia  HPI: Justin [name] is a 77 y.o. 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   He presented to the ED via EMS today secondary to bright red rectal bleeding which started early this morning. Labs in the ED showed a WBC count of 7.2.  Hemoglobin 8.2 with a baseline hemoglobin 9.1 - 9.3.  Hematocrit 27.3. MCV 105.  Platelet 193. BUN 20. Creatinine 6.10.  Sodium 141.  Potassium 3.8.  Albumin 2.9.  Total bili 0.6.  Alk phos 89.  AST 34.  ALT 13.  INR 1.3.  He reported passing a few smears of red blood per the rectum since arriving to the ED. 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  A GI consult was requested for further evaluation regarding hematochezia.  He went to bed last night feeling well. He awakened at 6 AM this morning in a puddle of red blood.  He went to the shower and wash himself off and called 911.  He is passed a few smears of red blood and dark blood clots since arriving to the ED.  He denies having any upper or lower abdominal pain.  No rectal pain.  He has frequent loose black stools which started after he was discharged from the SNF back to home January 2024.  He was previously admitted to the hospital 12/1 - 06/26/2022 secondary to sepsis due to a perirectal abscess s/p I & D which required IV antibiotics then a 2-week course of Cefepime when discharged to the SNF.  He reported passing normal black solid stools throughout his hospitalization and stay at the SNF.  However,  when he returned home January 2024 he started passing 2-3 loose black stools most days. He stated his stools turn black since he started taking Velphoro chewables bid as prescribed by his nephrologist.  He took Pepto-Bismol x 2 doses a few weeks ago when he had worsening diarrhea, none since then.  He is on Eliquis due to history of atrial flutter ablation/flutter, last dose was taken 2/28 at 6 PM.  No NSAID use.  Non-smoker.  No alcohol use.  He reported undergoing a colonoscopy in 1994 in 1999 by GI in [place], he believes both colonoscopies were normal.  No known family history of colorectal cancer. He denies having any dysphagia or heartburn.  He is on Omeprazole 20 mg daily. Never had an EGD.  His sister is at the bedside.  His niece Lucina Mellow is his caregiver.  BRIEF SUMMARY OF RECENT ED VISIT AND HOSPITALIZATION :  He presented to the ED 08/18/2022 with vomiting and diarrhea for 1 month.  Time, he reported his stools were black and watery and Eliquis and he took Pepto-Bismol.  CTAP without intra-abdominal/pelvic allergy to explain his symptoms.  Labs were stable and he was discharged home.  He was admitted to the hospital 06/12/2022 with sepsis secondary to a perirectal abscess.  He was febrile and hypotensive.  WBC 17.2.  Hemoglobin 10.8. CTAP showed a multiloculated/fluid collection and gas collection in the perianal region from the perineum to the inferior buttocks bilaterally. He received IV  Vancomycin and Zosyn.  He required norepinephrine drip and midodrine to support his BP.  General surgery was consulted, s/p I & D of the perirectal abscess by Dr. Aviva Signs 06/15/2022. Cultures were positive for MDR Ecoli and antibiotics were switched to cefepime and Vanco was discontinued.  He was extubated 24 hours postop.  His hospital course was complicated by nonsustained V. tach for which he received Amiodarone IV then po.  His clinical status stabilized and he was discharged to [facility] 06/26/2022 on 14  days of Cefepime.  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 ([place]) 12/07/2019   Dialysis patient [facility])    Gout    Hypertension    Right posterior capsular opacification 02/27/2021   Vitreous hemorrhage of right eye ([place]) 10/24/2019    Past Surgical History:  Procedure Laterality Date   A/V FISTULAGRAM Left 08/17/2017   Procedure: A/V FISTULAGRAM;  Surgeon: Katha Cabal, MD;  Location: [facility];  Service: Cardiovascular;  Laterality: Left;   A/V SHUNT INTERVENTION N/A 08/17/2017   Procedure: A/V SHUNT INTERVENTION;  Surgeon: Katha Cabal, MD;  Location: [facility];  Service: Cardiovascular;  Laterality: N/A;   AV FISTULA PLACEMENT Left 05/31/2015   Procedure: ARTERIOVENOUS (AV) FISTULA CREATION;  Surgeon: Katha Cabal, MD;  Location: [facility];  Service: Vascular;  Laterality: Left;   excision bone spurs Bilateral 1989   feet   INCISION AND DRAINAGE PERIRECTAL ABSCESS N/A 06/15/2022   Procedure: IRRIGATION AND DEBRIDEMENT PERIRECTAL ABSCESS;  Surgeon: Aviva Signs, MD;  Location: [facility];  Service: General;  Laterality: N/A;   KNEE SURGERY Left 1998   arthroscopy   SHOULDER SURGERY Left 1994   rotator cuff    Prior to Admission medications   Medication Sig Start Date End Date Taking? Authorizing Provider  allopurinol (ZYLOPRIM) 100 MG tablet Take 100 mg by mouth daily. 09/13/21   [provider]  amiodarone (PACERONE) 200 MG tablet Take 1 tablet (200 mg total) by mouth 2 (two) times daily. On 07/09/22, start amiodarone 200 mg once daily 06/26/22   Tat, Shanon Brow, MD  atorvastatin (LIPITOR) 40 MG tablet Take 40 mg by mouth daily.  10/30/14   [provider]  ELIQUIS 5 MG  TABS tablet Take 5 mg by mouth 2 (two) times daily. 10/07/19   [provider]  HYDROcodone-acetaminophen (NORCO/VICODIN) 5-325 MG tablet Take 1 tablet by mouth every 6 (six) hours as needed for severe pain. 06/26/22   Orson Eva, MD  LOKELMA 10 g PACK packet Take 10 g by mouth 2 (two) times a week. 06/08/22   [provider]  Methoxy PEG-Epoetin Beta (MIRCERA IJ) Mircera 06/13/21 12/18/22  [provider]  metoprolol tartrate (LOPRESSOR) 25 MG tablet Take 0.5 tablets (12.5 mg total) by mouth 2 (two) times daily. 06/26/22   Orson Eva, MD  omeprazole (PRILOSEC) 20 MG capsule Take 20 mg by mouth every morning.     [provider]  pregabalin (LYRICA) 75 MG capsule Take 1 capsule (75 mg total) by mouth 2 (two) times daily. 06/26/22   Orson Eva, MD  VELPHORO 500 MG chewable tablet Chew 500 mg by mouth 2 (two) times daily. 06/03/22  [provider]    No current facility-administered medications for this encounter.   Current Outpatient Medications  Medication Sig Dispense Refill   allopurinol (ZYLOPRIM) 100 MG tablet Take 100 mg by mouth daily.     amiodarone (PACERONE) 200 MG tablet Take 1 tablet (200 mg total) by mouth 2 (two) times daily. On 07/09/22, start amiodarone 200 mg once daily     atorvastatin (LIPITOR) 40 MG tablet Take 40 mg by mouth daily.      ELIQUIS 5 MG TABS tablet Take 5 mg by mouth 2 (two) times daily.     HYDROcodone-acetaminophen (NORCO/VICODIN) 5-325 MG tablet Take 1 tablet by mouth every 6 (six) hours as needed for severe pain. 10 tablet 0   LOKELMA 10 g PACK packet Take 10 g by mouth 2 (two) times a week.     Methoxy PEG-Epoetin Beta (MIRCERA IJ) Mircera     metoprolol tartrate (LOPRESSOR) 25 MG tablet Take 0.5 tablets (12.5 mg total) by mouth 2 (two) times daily.     omeprazole (PRILOSEC) 20 MG capsule Take 20 mg by mouth every morning.      pregabalin (LYRICA) 75 MG capsule Take 1 capsule (75 mg total) by mouth 2 (two) times  daily. 10 capsule 0   VELPHORO 500 MG chewable tablet Chew 500 mg by mouth 2 (two) times daily.      Allergies as of 09/10/2022   (No Known Allergies)    Family History  Problem Relation Age of Onset   Kidney disease Mother    Alcoholism Father    Alcoholism Brother     Social History   Socioeconomic History   Marital status: Widowed    Spouse name: Not on file   Number of children: Not on file   Years of education: Not on file   Highest education level: Not on file  Occupational History   Not on file  Tobacco Use   Smoking status: Former    Types: Cigarettes    Quit date: 10/01/1978    Years since quitting: 43.9   Smokeless tobacco: Never  Vaping Use   Vaping Use: Never used  Substance and Sexual Activity   Alcohol use: No   Drug use: No   Sexual activity: Not on file  Other Topics Concern   Not on file  Social History Narrative   Not on file   Social Determinants of Health   Financial Resource Strain: Not on file  Food Insecurity: No Food Insecurity (06/13/2022)   Hunger Vital Sign    Worried About Running Out of Food in the Last Year: Never true    Ran Out of Food in the Last Year: Never true  Transportation Needs: No Transportation Needs (06/13/2022)   PRAPARE - Hydrologist (Medical): No    Lack of Transportation (Non-Medical): No  Physical Activity: Not on file  Stress: Not on file  Social Connections: Not on file  Intimate Partner Violence: Not At Risk (06/13/2022)   Humiliation, Afraid, Rape, and Kick questionnaire    Fear of Current or Ex-Partner: No    Emotionally Abused: No    Physically Abused: No    Sexually Abused: No    Review of Systems: Gen: Denies fever, sweats or chills. No weight loss.  CV: Denies chest pain, palpitations or edema. Resp: Denies cough, shortness of breath of hemoptysis.  GI: See HPI. GU : Anuretic on hemodialysis. MS: Denies joint pain, muscles aches or weakness. Derm: Chronic leg  ulcer. Psych: Denies depression, anxiety, memory loss or confusion. Heme: Denies easy bruising, bleeding. Neuro:  Denies headaches, dizziness or paresthesias. Endo:  + Diabetes.  Physical Exam: Vital signs in last 24 hours: Temp:  [97.9 F (36.6 C)] 97.9 F (36.6 C) (02/29 0804) Pulse Rate:  [71-76] 71 (02/29 1000) Resp:  [15-23] 15 (02/29 1200) BP: (116-129)/(70-79) 121/79 (02/29 1200) SpO2:  [93 %-96 %] 94 % (02/29 1143)   General: 77 year old male in no acute distress. Head:  Normocephalic and atraumatic. Eyes:  No scleral icterus. Conjunctiva pink. Ears:  Normal auditory acuity. Nose:  No deformity, discharge or lesions. Mouth:  Dentition intact. No ulcers or lesions.  Neck:  Supple. No lymphadenopathy or thyromegaly.  Lungs: Breath sounds clear throughout. No wheezes, rhonchi or crackles.  Heart: Regular rate and rhythm, murmur/fistula bruit radiates to heart. Abdomen:, Nondistended.  Nontender.  Positive bowel sounds to all 4 quadrants. Rectal: No external hemorrhoids or fissures.  General hemorrhoids palpated without prolapse.  Dark red blood on gloved finger. Moderate rectal pain on exam.  Musculoskeletal:  Symmetrical without gross deformities.  Pulses:  Normal pulses noted. Extremities:  Without clubbing or edema. LUE  fistula with + bruit and thrill.  Neurologic:  Alert and  oriented x 4. No focal deficits.  Skin:  Intact without significant lesions or rashes. Psych:  Alert and cooperative. Normal mood and affect.  Intake/Output from previous day: No intake/output data recorded. Intake/Output this shift: No intake/output data recorded.  Lab Results: Recent Labs    09/10/22 0820 09/10/22 0849  WBC 7.2  --   HGB 8.2* 9.9*  HCT 27.3* 29.0*  PLT 193  --    BMET Recent Labs    09/10/22 0820 09/10/22 0849  NA 141 141  K 3.8 3.3*  CL 98 98  CO2 28  --   GLUCOSE 118* 110*  BUN 20 21  CREATININE 6.10* 7.10*  CALCIUM 8.1*  --    LFT Recent Labs     09/10/22 0820  PROT 7.1  ALBUMIN 2.9*  AST 34  ALT 13  ALKPHOS 89  BILITOT 0.6   PT/INR Recent Labs    09/10/22 0820  LABPROT 16.2*  INR 1.3*   Hepatitis Panel No results for input(s): "HEPBSAG", "HCVAB", "HEPAIGM", "HEPBIGM" in the last 72 hours.    Studies/Results: DG Chest Portable 1 View  Result Date: 09/10/2022 CLINICAL DATA:  Provided history: Hypoxia. EXAM: PORTABLE CHEST 1 VIEW COMPARISON:  Prior chest radiographs 06/16/2022 and earlier. FINDINGS: Shallow inspiration radiograph, accentuating the cardiac silhouette and limiting evaluation of heart size. Aortic atherosclerosis. Bilateral perihilar and left lung base opacities, which may reflect atelectasis, edema and/or atypical/viral infection. No evidence of pleural effusion or pneumothorax. No acute osseous abnormality identified. Chronic widening of the left acromioclavicular joint. A vascular stent projects in the left subclavian/axillary region. IMPRESSION: Shallow inspiration radiograph. Bilateral perihilar and left lung base opacities which may reflect atelectasis, edema and/or atypical/viral infection. Electronically Signed   By: Kellie Simmering D.O.   On: 09/10/2022 12:17   CT ANGIO GI BLEED  Result Date: 09/10/2022 CLINICAL DATA:  Rectal bleeding EXAM: CTA ABDOMEN AND PELVIS WITHOUT AND WITH CONTRAST TECHNIQUE: Multidetector CT imaging of the abdomen and pelvis was performed using the standard protocol during bolus administration of intravenous contrast. Multiplanar reconstructed images and MIPs were obtained and reviewed to evaluate the vascular anatomy. RADIATION DOSE REDUCTION: This exam was performed according to the departmental dose-optimization program which includes automated exposure control, adjustment of the mA  and/or kV according to patient size and/or use of iterative reconstruction technique. CONTRAST:  75 mL OMNIPAQUE IOHEXOL 350 MG/ML SOLN COMPARISON:  08/19/2022 FINDINGS: VASCULAR Aorta: Dense atheromatous  calcifications. No aneurysm or dissection. Celiac: Atheromatous changes no evidence of occlusion. SMA: Atheromatous changes no evidence of occlusion. Renals: Atheromatous changes no evidence of occlusion. IMA: Atheromatous changes no evidence of occlusion. Inflow: Patent without evidence of aneurysm, dissection, vasculitis or significant stenosis. Proximal Outflow: Bilateral common femoral and visualized portions of the superficial and profunda femoral arteries are patent without evidence of aneurysm, dissection, vasculitis or significant stenosis. Veins: No obvious venous abnormality within the limitations of this arterial phase study. Review of the MIP images confirms the above findings. NON-VASCULAR Lower chest: Dependent basilar subsegmental atelectasis. No pleural or pericardial effusion identified. There is bilateral gynecomastia. Hepatobiliary: Distended gallbladder with layering milk calcium or calcified stones. No hepatic parenchymal abnormalities or biliary ductal dilatation identified. Pancreas: Unremarkable. No pancreatic ductal dilatation or surrounding inflammatory changes. Spleen: Normal in size without focal abnormality. Adrenals/Urinary Tract: No adrenal lesions. Kidneys are atrophic. There are numerous centimeter-sized cystic lesions in both kidneys that do not need further imaging follow up. No hydronephrosis or nephrolithiasis identified. Urinary bladder empty. Stomach/Bowel: Stomach is within normal limits. Appendix appears normal. No evidence of bowel wall thickening, distention, or inflammatory changes. Delayed postcontrast images demonstrate no evidence of extravasation of contrast indicate the presence of active GI hemorrhage. Lymphatic: No suspicious adenopathy identified. Reproductive: Prostate brachytherapy seeds noted. Prostate appears mildly prominent. Other: Small abdominal wall periumbilical defect containing omental fat without evidence of incarceration Musculoskeletal: Diffuse  osteoblastic changes with the differential including diffuse metastatic disease versus renal osteodystrophy. IMPRESSION: VASCULAR 1. Dense atheromatous changes noted for the aorta and its branches without evidence of significant occlusive changes, aneurysm or dissection. NON-VASCULAR 1. No evidence of extravasation of IV contrast to indicate the presence of active GI bleed. 2. Sequelae of end-stage renal disease. 3. Distended gallbladder with evidence of stones or sludge. 4. 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 5. Bilateral gynecomastia. Electronically Signed   By: Sammie Bench M.D.   On: 09/10/2022 11:20    IMPRESSION/PLAN:  77 year old male presented to the ED with painless hematochezia.  CTA negative for active GI bleeding.  Hemoglobin 8.2 with baseline hemoglobin 9.1 - 9.3.  -Clear liquid diet -Check H&H every 6 hours x 24 hours -Transfuse for Hg < 7 -Monitor the patient closely for active GI bleeding -Diagnostic colonoscopy during this hospital admission  Chronic melenic stools, started after he started taking Velphoro.  He last took Pepto-Bismol 2 weeks ago. He remains on Eliquis, last dose taken 6pm on 2/28.  -Consider EGD at time of colonoscopy, await further recommendations per Dr. Lorenso Courier -Pantoprazole 40 mg IV daily  Intermittent diarrhea since 07/2022.  Received IV then oral antibiotics 06/2022 secondary to sepsis from a perirectal abscess status post I&D -Check C. 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

## 2022-09-10 NOTE — H&P (Addendum)
History and Physical   Justin [name] [id number] DOB: 08/27/1945 DOA: 09/10/2022  PCP: Michell Heinrich, DO   Patient coming from: Home  Chief Complaint: Rectal Bleeding  HPI: Justin [name] is a 77 y.o. male with medical history significant of Diabetes, atrial fibrillation, hypertension, ESRD on HD, anemia, tachycardia, first-degree AV block, obesity, gout, GERD, back pain, prostate cancer in 2009 presenting with rectal bleeding.  Patient reports rectal bleeding starting overnight, initially started between 1 and 6 AM when he got up during the nigh.  Initially had 2 episodes of bright red blood per rectum.  Has continued bleeding this morning with clots.  No history of prior GI bleeding.  Did have recent surgical intervention of perirectal abscess in December.  He is on Eliquis due to his history of paroxysmal A-fib.  Patient reports his last dialysis session was yesterday which she completed.  Has had some history of intermittent loose stools ongoing since he got home from rehab facility in January.  Does have some alternating constipation as well. Reporting some loose stool with cough at times.  Denies fevers, chills, chest pain, shortness of breath, abdominal pain, constipation, diarrhea, nausea, vomiting.  ED Course: Vital signs in the ED were stable.  Did later have transient desaturation while sleeping and placed on 2 L.  Lab workup included CMP with creatinine stable at 6.1, glucose 118, calcium 8.1, albumin 2.9.  CBC with hemoglobin 8.2 which is somewhat lower than previous of around 9.  MCV 105.  PT and INR mildly elevated at 16.2 and 9.3 respectively.  Patient typed and screened in the ED.  No EKG performed.  Chest x-ray showed low inspiration with bilateral opacities recommending atelectasis versus edema versus atypical infection.  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  GI consulted and are following along.  Review of Systems: As per HPI otherwise all other systems reviewed and are negative.  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 ([place]) 12/07/2019   Dialysis patient ([place])    Gout    Hypercalcemia 11/14/2021   Hyperkalemia 10/19/2018   Hypertension    Hypotension 06/13/2022   Right posterior capsular opacification 02/27/2021   Septic shock ([place])    Vitreous hemorrhage of right eye ([place]) 10/24/2019    Past Surgical History:  Procedure Laterality Date   A/V FISTULAGRAM Left 08/17/2017   Procedure: A/V FISTULAGRAM;  Surgeon: Katha Cabal, MD;  Location: [facility];  Service: Cardiovascular;  Laterality: Left;   A/V SHUNT INTERVENTION N/A 08/17/2017   Procedure: A/V SHUNT INTERVENTION;  Surgeon: Katha Cabal, MD;  Location: [facility];  Service: Cardiovascular;  Laterality: N/A;   AV FISTULA PLACEMENT Left 05/31/2015   Procedure: ARTERIOVENOUS (AV) FISTULA CREATION;  Surgeon: Katha Cabal, MD;  Location: [facility];  Service: Vascular;  Laterality: Left;   excision bone spurs Bilateral 1989   feet   INCISION AND DRAINAGE PERIRECTAL ABSCESS N/A 06/15/2022  Procedure: IRRIGATION AND DEBRIDEMENT PERIRECTAL ABSCESS;  Surgeon: Aviva Signs, MD;  Location: [facility];  Service: General;  Laterality: N/A;   KNEE SURGERY Left 1998   arthroscopy   SHOULDER SURGERY Left 1994   rotator cuff    Social History  reports that he quit smoking about 43  years ago. His smoking use included cigarettes. He has never used smokeless tobacco. He reports that he does not drink alcohol and does not use drugs.  No Known Allergies  Family History  Problem Relation Age of Onset   Kidney disease Mother    Alcoholism Father    Alcoholism Brother   Reviewed on admission  Prior to Admission medications   Medication Sig Start Date End Date Taking? Authorizing Provider  allopurinol (ZYLOPRIM) 100 MG tablet Take 100 mg by mouth daily. 09/13/21   [provider]  amiodarone (PACERONE) 200 MG tablet Take 1 tablet (200 mg total) by mouth 2 (two) times daily. On 07/09/22, start amiodarone 200 mg once daily 06/26/22   Tat, Shanon Brow, MD  atorvastatin (LIPITOR) 40 MG tablet Take 40 mg by mouth daily.  10/30/14   [provider]  ELIQUIS 5 MG TABS tablet Take 5 mg by mouth 2 (two) times daily. 10/07/19   [provider]  HYDROcodone-acetaminophen (NORCO/VICODIN) 5-325 MG tablet Take 1 tablet by mouth every 6 (six) hours as needed for severe pain. 06/26/22   Orson Eva, MD  LOKELMA 10 g PACK packet Take 10 g by mouth 2 (two) times a week. 06/08/22   [provider]  Methoxy PEG-Epoetin Beta (MIRCERA IJ) Mircera 06/13/21 12/18/22  [provider]  metoprolol tartrate (LOPRESSOR) 25 MG tablet Take 0.5 tablets (12.5 mg total) by mouth 2 (two) times daily. 06/26/22   Orson Eva, MD  omeprazole (PRILOSEC) 20 MG capsule Take 20 mg by mouth every morning.     [provider]  pregabalin (LYRICA) 75 MG capsule Take 1 capsule (75 mg total) by mouth 2 (two) times daily. 06/26/22   Orson Eva, MD  VELPHORO 500 MG chewable tablet Chew 500 mg by mouth 2 (two) times daily. 06/03/22   [provider]    Physical Exam: Vitals:   09/10/22 1140 09/10/22 1143 09/10/22 1200 09/10/22 1300  BP:   121/79 116/70  Pulse:    67  Resp:   15 15  Temp:      TempSrc:      SpO2: 94% 94%  99%    Physical Exam Constitutional:       General: He is not in acute distress.    Appearance: Normal appearance.  HENT:     Head: Normocephalic and atraumatic.     Mouth/Throat:     Mouth: Mucous membranes are moist.     Pharynx: Oropharynx is clear.  Eyes:     Extraocular Movements: Extraocular movements intact.     Pupils: Pupils are equal, round, and reactive to light.  Cardiovascular:     Rate and Rhythm: Normal rate and regular rhythm.     Pulses: Normal pulses.     Heart sounds: Normal heart sounds.  Pulmonary:     Effort: Pulmonary effort is normal. No respiratory distress.     Breath sounds: Examination of the left-lower field reveals rales. Rales present.  Abdominal:     General: Bowel sounds are normal. There is no distension.     Palpations: Abdomen is soft.     Tenderness: There is no abdominal tenderness.  Musculoskeletal:  General: No swelling or deformity.  Skin:    General: Skin is warm and dry.  Neurological:     General: No focal deficit present.     Mental Status: Mental status is at baseline.    Labs on Admission: I have personally reviewed following labs and imaging studies  CBC: Recent Labs  Lab 09/10/22 0820 09/10/22 0849  WBC 7.2  --   NEUTROABS 4.1  --   HGB 8.2* 9.9*  HCT 27.3* 29.0*  MCV 105.0*  --   PLT 193  --     Basic Metabolic Panel: Recent Labs  Lab 09/10/22 0820 09/10/22 0849  NA 141 141  K 3.8 3.3*  CL 98 98  CO2 28  --   GLUCOSE 118* 110*  BUN 20 21  CREATININE 6.10* 7.10*  CALCIUM 8.1*  --     GFR: CrCl cannot be calculated (Unknown ideal weight.).  Liver Function Tests: Recent Labs  Lab 09/10/22 0820  AST 34  ALT 13  ALKPHOS 89  BILITOT 0.6  PROT 7.1  ALBUMIN 2.9*    Urine analysis:    Component Value Date/Time   COLORURINE AMBER (A) 05/27/2020 1451   APPEARANCEUR CLOUDY (A) 05/27/2020 1451   LABSPEC 1.017 05/27/2020 1451   PHURINE 6.0 05/27/2020 1451   GLUCOSEU 50 (A) 05/27/2020 1451   HGBUR LARGE (A) 05/27/2020 1451    BILIRUBINUR NEGATIVE 05/27/2020 1451   [place] 05/27/2020 1451   PROTEINUR 100 (A) 05/27/2020 1451   UROBILINOGEN 0.2 08/02/2010 1534   NITRITE NEGATIVE 05/27/2020 1451   LEUKOCYTESUR SMALL (A) 05/27/2020 1451    Radiological Exams on Admission: DG Chest Portable 1 View  Result Date: 09/10/2022 CLINICAL DATA:  Provided history: Hypoxia. EXAM: PORTABLE CHEST 1 VIEW COMPARISON:  Prior chest radiographs 06/16/2022 and earlier. FINDINGS: Shallow inspiration radiograph, accentuating the cardiac silhouette and limiting evaluation of heart size. Aortic atherosclerosis. Bilateral perihilar and left lung base opacities, which may reflect atelectasis, edema and/or atypical/viral infection. No evidence of pleural effusion or pneumothorax. No acute osseous abnormality identified. Chronic widening of the left acromioclavicular joint. A vascular stent projects in the left subclavian/axillary region. IMPRESSION: Shallow inspiration radiograph. Bilateral perihilar and left lung base opacities which may reflect atelectasis, edema and/or atypical/viral infection. Electronically Signed   By: Kellie Simmering D.O.   On: 09/10/2022 12:17   CT ANGIO GI BLEED  Result Date: 09/10/2022 CLINICAL DATA:  Rectal bleeding EXAM: CTA ABDOMEN AND PELVIS WITHOUT AND WITH CONTRAST TECHNIQUE: Multidetector CT imaging of the abdomen and pelvis was performed using the standard protocol during bolus administration of intravenous contrast. Multiplanar reconstructed images and MIPs were obtained and reviewed to evaluate the vascular anatomy. RADIATION DOSE REDUCTION: This exam was performed according to the departmental dose-optimization program which includes automated exposure control, adjustment of the mA and/or kV according to patient size and/or use of iterative reconstruction technique. CONTRAST:  75 mL OMNIPAQUE IOHEXOL 350 MG/ML SOLN COMPARISON:  08/19/2022 FINDINGS: VASCULAR Aorta: Dense atheromatous calcifications. No  aneurysm or dissection. Celiac: Atheromatous changes no evidence of occlusion. SMA: Atheromatous changes no evidence of occlusion. Renals: Atheromatous changes no evidence of occlusion. IMA: Atheromatous changes no evidence of occlusion. Inflow: Patent without evidence of aneurysm, dissection, vasculitis or significant stenosis. Proximal Outflow: Bilateral common femoral and visualized portions of the superficial and profunda femoral arteries are patent without evidence of aneurysm, dissection, vasculitis or significant stenosis. Veins: No obvious venous abnormality within the limitations of this arterial phase study. Review of the MIP images  confirms the above findings. NON-VASCULAR Lower chest: Dependent basilar subsegmental atelectasis. No pleural or pericardial effusion identified. There is bilateral gynecomastia. Hepatobiliary: Distended gallbladder with layering milk calcium or calcified stones. No hepatic parenchymal abnormalities or biliary ductal dilatation identified. Pancreas: Unremarkable. No pancreatic ductal dilatation or surrounding inflammatory changes. Spleen: Normal in size without focal abnormality. Adrenals/Urinary Tract: No adrenal lesions. Kidneys are atrophic. There are numerous centimeter-sized cystic lesions in both kidneys that do not need further imaging follow up. No hydronephrosis or nephrolithiasis identified. Urinary bladder empty. Stomach/Bowel: Stomach is within normal limits. Appendix appears normal. No evidence of bowel wall thickening, distention, or inflammatory changes. Delayed postcontrast images demonstrate no evidence of extravasation of contrast indicate the presence of active GI hemorrhage. Lymphatic: No suspicious adenopathy identified. Reproductive: Prostate brachytherapy seeds noted. Prostate appears mildly prominent. Other: Small abdominal wall periumbilical defect containing omental fat without evidence of incarceration Musculoskeletal: Diffuse osteoblastic changes  with the differential including diffuse metastatic disease versus renal osteodystrophy. IMPRESSION: VASCULAR 1. Dense atheromatous changes noted for the aorta and its branches without evidence of significant occlusive changes, aneurysm or dissection. NON-VASCULAR 1. No evidence of extravasation of IV contrast to indicate the presence of active GI bleed. 2. Sequelae of end-stage renal disease. 3. Distended gallbladder with evidence of stones or sludge. 4. 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 5. Bilateral gynecomastia. Electronically Signed   By: Sammie Bench M.D.   On: 09/10/2022 11:20    EKG: Not performed in the emergency department  Assessment/Plan Principal Problem:   GI bleed Active Problems:   Essential hypertension   DM (diabetes mellitus) (HCC)   Anemia in chronic kidney disease   ESRD on dialysis (HCC)   Paroxysmal A-fib (HCC)   GERD (gastroesophageal reflux disease)   Gouty arthropathy   Mixed hyperlipidemia   Obesity (BMI 30-39.9)   GI bleed Acute on chronic anemia > Patient presenting with 1 day of rectal bleeding.  Noted to have bright red blood and clots in the ED. > No prior history of rectal bleeding.  Is on Eliquis due to paroxysmal A-fib.  Did have perirectal abscess surgically drained in December of last year. > Hemoglobin remains near baseline on initial check at 8.2 from baseline of 9.  Macrocytic.  Repeat is pending.  Type and screen performed in the ED. > CT GI bleed did not show any active bleeding at this time. > GI consulted and are following - Appreciate GI recommendations - Monitor in telemetry - Trend hemoglobin, transfuse hemoglobin less than 7 - N.p.o. for now pending GI recommendations  ESRD on HD Intermittent hypoxic respiratory failure > Last HD session was yesterday which she completed. > Intermittent desaturations in the ED most recently when sleeping in the 80s.  Chest x-ray was low lung  volumes but showed atelectasis versus edema, possibly mildly volume up.  Mild rales left base - Consult nephrology as patient will need dialysis while admitted  - Continue home [place] renal function and electrolytes  Diabetes - Not currently on medication for this  Hypertension - Continue home metoprolol  Paroxysmal atrial fibrillation - Continue home amiodarone and metoprolol - Holding home Eliquis in the setting of bleeding as above  Gout - Continue allopurinol  GERD - Continue home PPI  Obesity History of prostate cancer - Noted  DVT prophylaxis: SCDs Code Status:   Full,Confirmed at bedside, would not want to be on life support long term. Family Communication:  Updated at bedside  Disposition  Plan:   Patient is from:  Home  Anticipated DC to:  Home  Anticipated DC date:  1-3 days  Anticipated DC barriers: None  Consults called:  Gastroenterology consulted in the ED, Nephrology Admission status:  Observation, telemetry  Severity of Illness: The appropriate patient status for this patient is OBSERVATION. Observation status is judged to be reasonable and necessary in order to provide the required intensity of service to ensure the patient's safety. The patient's presenting symptoms, physical exam findings, and initial radiographic and laboratory data in the context of their medical condition is felt to place them at decreased risk for further clinical deterioration. Furthermore, it is anticipated that the patient will be medically stable for discharge from the hospital within 2 midnights of admission.    Marcelyn Bruins MD [facility]  How to contact the [facility] Attending or Consulting provider [place] or covering provider during after hours [place], for this patient?   Check the care team in [facility] and look for a) attending/consulting [facility] provider listed and b) the [facility] team listed Log into [email or web address] and use [place]'s universal password to access. If  you do not have the password, please contact the hospital operator. Locate the [facility] provider you are looking for under [facility] and page to a number that you can be directly reached. If you still have difficulty reaching the provider, please page the [facility] (Director on Call) for the Hospitalists listed on amion for assistance.  09/10/2022, 1:14 PM

## 2022-09-10 NOTE — Plan of Care (Signed)
  Problem: Education: Goal: Knowledge of General Education information will improve Description: Including pain rating scale, medication(s)/side effects and non-pharmacologic comfort measures Outcome: Progressing   Problem: Safety: Goal: Ability to remain free from injury will improve Outcome: Progressing   

## 2022-09-10 NOTE — Consult Note (Signed)
Referring Provider: Dr. Neva Seat  Primary Care Physician:  Michell Heinrich, DO Primary Gastroenterologist:  Althia Forts   Reason for Consultation: Hematochezia  HPI: Justin [name] is a 77 y.o. 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   He presented to the ED via EMS today secondary to bright red rectal bleeding which started early this morning. Labs in the ED showed a WBC count of 7.2.  Hemoglobin 8.2 with a baseline hemoglobin 9.1 - 9.3.  Hematocrit 27.3. MCV 105.  Platelet 193. BUN 20. Creatinine 6.10.  Sodium 141.  Potassium 3.8.  Albumin 2.9.  Total bili 0.6.  Alk phos 89.  AST 34.  ALT 13.  INR 1.3.  He reported passing a few smears of red blood per the rectum since arriving to the ED. 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  A GI consult was requested for further evaluation regarding hematochezia.  He went to bed last night feeling well. He awakened at 6 AM this morning in a puddle of red blood.  He went to the shower and wash himself off and called 911.  He is passed a few smears of red blood and dark blood clots since arriving to the ED.  He denies having any upper or lower abdominal pain.  No rectal pain.  He has frequent loose black stools which started after he was discharged from the SNF back to home January 2024.  He was previously admitted to the hospital 12/1 - 06/26/2022 secondary to sepsis due to a perirectal abscess s/p I & D which required IV antibiotics then a 2-week course of Cefepime when discharged to the SNF.  He reported passing normal black solid stools throughout his hospitalization and stay at the SNF.  However,  when he returned home January 2024 he started passing 2-3 loose black stools most days. He stated his stools turn black since he started taking Velphoro chewables bid as prescribed by his nephrologist.  He took Pepto-Bismol x 2 doses a few weeks ago when he had worsening diarrhea, none since then.  He is on Eliquis due to history of atrial flutter ablation/flutter, last dose was taken 2/28 at 6 PM.  No NSAID use.  Non-smoker.  No alcohol use.  He reported undergoing a colonoscopy in 1994 in 1999 by GI in [place], he believes both colonoscopies were normal.  No known family history of colorectal cancer. He denies having any dysphagia or heartburn.  He is on Omeprazole 20 mg daily. Never had an EGD.  His sister is at the bedside.  His niece Lucina Mellow is his caregiver.  BRIEF SUMMARY OF RECENT ED VISIT AND HOSPITALIZATION :  He presented to the ED 08/18/2022 with vomiting and diarrhea for 1 month.  Time, he reported his stools were black and watery and Eliquis and he took Pepto-Bismol.  CTAP without intra-abdominal/pelvic allergy to explain his symptoms.  Labs were stable and he was discharged home.  He was admitted to the hospital 06/12/2022 with sepsis secondary to a perirectal abscess.  He was febrile and hypotensive.  WBC 17.2.  Hemoglobin 10.8. CTAP showed a multiloculated/fluid collection and gas collection in the perianal region from the perineum to the inferior buttocks bilaterally. He received IV  Vancomycin and Zosyn.  He required norepinephrine drip and midodrine to support his BP.  General surgery was consulted, s/p I & D of the perirectal abscess by Dr. Aviva Signs 06/15/2022. Cultures were positive for MDR Ecoli and antibiotics were switched to cefepime and Vanco was discontinued.  He was extubated 24 hours postop.  His hospital course was complicated by nonsustained V. tach for which he received Amiodarone IV then po.  His clinical status stabilized and he was discharged to [facility] 06/26/2022 on 14  days of Cefepime.  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 ([place]) 12/07/2019   Dialysis patient [facility])    Gout    Hypertension    Right posterior capsular opacification 02/27/2021   Vitreous hemorrhage of right eye ([place]) 10/24/2019    Past Surgical History:  Procedure Laterality Date   A/V FISTULAGRAM Left 08/17/2017   Procedure: A/V FISTULAGRAM;  Surgeon: Katha Cabal, MD;  Location: [place] [facility];  Service: Cardiovascular;  Laterality: Left;   A/V SHUNT INTERVENTION N/A 08/17/2017   Procedure: A/V SHUNT INTERVENTION;  Surgeon: Katha Cabal, MD;  Location: [place] [facility];  Service: Cardiovascular;  Laterality: N/A;   AV FISTULA PLACEMENT Left 05/31/2015   Procedure: ARTERIOVENOUS (AV) FISTULA CREATION;  Surgeon: Katha Cabal, MD;  Location: [facility];  Service: Vascular;  Laterality: Left;   excision bone spurs Bilateral 1989   feet   INCISION AND DRAINAGE PERIRECTAL ABSCESS N/A 06/15/2022   Procedure: IRRIGATION AND DEBRIDEMENT PERIRECTAL ABSCESS;  Surgeon: Aviva Signs, MD;  Location: [facility];  Service: General;  Laterality: N/A;   KNEE SURGERY Left 1998   arthroscopy   SHOULDER SURGERY Left 1994   rotator cuff    Prior to Admission medications   Medication Sig Start Date End Date Taking? Authorizing Provider  allopurinol (ZYLOPRIM) 100 MG tablet Take 100 mg by mouth daily. 09/13/21   [provider]  amiodarone (PACERONE) 200 MG tablet Take 1 tablet (200 mg total) by mouth 2 (two) times daily. On 07/09/22, start amiodarone 200 mg once daily 06/26/22   Tat, Shanon Brow, MD  atorvastatin (LIPITOR) 40 MG tablet Take 40 mg by mouth daily.  10/30/14   [provider]  ELIQUIS 5 MG  TABS tablet Take 5 mg by mouth 2 (two) times daily. 10/07/19   [provider]  HYDROcodone-acetaminophen (NORCO/VICODIN) 5-325 MG tablet Take 1 tablet by mouth every 6 (six) hours as needed for severe pain. 06/26/22   Orson Eva, MD  LOKELMA 10 g PACK packet Take 10 g by mouth 2 (two) times a week. 06/08/22   [provider]  Methoxy PEG-Epoetin Beta (MIRCERA IJ) Mircera 06/13/21 12/18/22  [provider]  metoprolol tartrate (LOPRESSOR) 25 MG tablet Take 0.5 tablets (12.5 mg total) by mouth 2 (two) times daily. 06/26/22   Orson Eva, MD  omeprazole (PRILOSEC) 20 MG capsule Take 20 mg by mouth every morning.     [provider]  pregabalin (LYRICA) 75 MG capsule Take 1 capsule (75 mg total) by mouth 2 (two) times daily. 06/26/22   Orson Eva, MD  VELPHORO 500 MG chewable tablet Chew 500 mg by mouth 2 (two) times daily. 06/03/22  [provider]    No current facility-administered medications for this encounter.   Current Outpatient Medications  Medication Sig Dispense Refill   allopurinol (ZYLOPRIM) 100 MG tablet Take 100 mg by mouth daily.     amiodarone (PACERONE) 200 MG tablet Take 1 tablet (200 mg total) by mouth 2 (two) times daily. On 07/09/22, start amiodarone 200 mg once daily     atorvastatin (LIPITOR) 40 MG tablet Take 40 mg by mouth daily.      ELIQUIS 5 MG TABS tablet Take 5 mg by mouth 2 (two) times daily.     HYDROcodone-acetaminophen (NORCO/VICODIN) 5-325 MG tablet Take 1 tablet by mouth every 6 (six) hours as needed for severe pain. 10 tablet 0   LOKELMA 10 g PACK packet Take 10 g by mouth 2 (two) times a week.     Methoxy PEG-Epoetin Beta (MIRCERA IJ) Mircera     metoprolol tartrate (LOPRESSOR) 25 MG tablet Take 0.5 tablets (12.5 mg total) by mouth 2 (two) times daily.     omeprazole (PRILOSEC) 20 MG capsule Take 20 mg by mouth every morning.      pregabalin (LYRICA) 75 MG capsule Take 1 capsule (75 mg total) by mouth 2 (two) times  daily. 10 capsule 0   VELPHORO 500 MG chewable tablet Chew 500 mg by mouth 2 (two) times daily.      Allergies as of 09/10/2022   (No Known Allergies)    Family History  Problem Relation Age of Onset   Kidney disease Mother    Alcoholism Father    Alcoholism Brother     Social History   Socioeconomic History   Marital status: Widowed    Spouse name: Not on file   Number of children: Not on file   Years of education: Not on file   Highest education level: Not on file  Occupational History   Not on file  Tobacco Use   Smoking status: Former    Types: Cigarettes    Quit date: 10/01/1978    Years since quitting: 43.9   Smokeless tobacco: Never  Vaping Use   Vaping Use: Never used  Substance and Sexual Activity   Alcohol use: No   Drug use: No   Sexual activity: Not on file  Other Topics Concern   Not on file  Social History Narrative   Not on file   Social Determinants of Health   Financial Resource Strain: Not on file  Food Insecurity: No Food Insecurity (06/13/2022)   Hunger Vital Sign    Worried About Running Out of Food in the Last Year: Never true    Ran Out of Food in the Last Year: Never true  Transportation Needs: No Transportation Needs (06/13/2022)   PRAPARE - Hydrologist (Medical): No    Lack of Transportation (Non-Medical): No  Physical Activity: Not on file  Stress: Not on file  Social Connections: Not on file  Intimate Partner Violence: Not At Risk (06/13/2022)   Humiliation, Afraid, Rape, and Kick questionnaire    Fear of Current or Ex-Partner: No    Emotionally Abused: No    Physically Abused: No    Sexually Abused: No    Review of Systems: Gen: Denies fever, sweats or chills. No weight loss.  CV: Denies chest pain, palpitations or edema. Resp: Denies cough, shortness of breath of hemoptysis.  GI: See HPI. GU : Anuretic on hemodialysis. MS: Denies joint pain, muscles aches or weakness. Derm: Chronic leg  ulcer. Psych: Denies depression, anxiety, memory loss or confusion. Heme: Denies easy bruising, bleeding. Neuro:  Denies headaches, dizziness or paresthesias. Endo:  + Diabetes.  Physical Exam: Vital signs in last 24 hours: Temp:  [97.9 F (36.6 C)] 97.9 F (36.6 C) (02/29 0804) Pulse Rate:  [71-76] 71 (02/29 1000) Resp:  [15-23] 15 (02/29 1200) BP: (116-129)/(70-79) 121/79 (02/29 1200) SpO2:  [93 %-96 %] 94 % (02/29 1143)   General: 77 year old male in no acute distress. Head:  Normocephalic and atraumatic. Eyes:  No scleral icterus. Conjunctiva pink. Ears:  Normal auditory acuity. Nose:  No deformity, discharge or lesions. Mouth:  Dentition intact. No ulcers or lesions.  Neck:  Supple. No lymphadenopathy or thyromegaly.  Lungs: Breath sounds clear throughout. No wheezes, rhonchi or crackles.  Heart: Regular rate and rhythm, murmur/fistula bruit radiates to heart. Abdomen:, Nondistended.  Nontender.  Positive bowel sounds to all 4 quadrants. Rectal: No external hemorrhoids or fissures.  General hemorrhoids palpated without prolapse.  Dark red blood on gloved finger. Moderate rectal pain on exam.  Musculoskeletal:  Symmetrical without gross deformities.  Pulses:  Normal pulses noted. Extremities:  Without clubbing or edema. LUE  fistula with + bruit and thrill.  Neurologic:  Alert and  oriented x 4. No focal deficits.  Skin:  Intact without significant lesions or rashes. Psych:  Alert and cooperative. Normal mood and affect.  Intake/Output from previous day: No intake/output data recorded. Intake/Output this shift: No intake/output data recorded.  Lab Results: Recent Labs    09/10/22 0820 09/10/22 0849  WBC 7.2  --   HGB 8.2* 9.9*  HCT 27.3* 29.0*  PLT 193  --    BMET Recent Labs    09/10/22 0820 09/10/22 0849  NA 141 141  K 3.8 3.3*  CL 98 98  CO2 28  --   GLUCOSE 118* 110*  BUN 20 21  CREATININE 6.10* 7.10*  CALCIUM 8.1*  --    LFT Recent Labs     09/10/22 0820  PROT 7.1  ALBUMIN 2.9*  AST 34  ALT 13  ALKPHOS 89  BILITOT 0.6   PT/INR Recent Labs    09/10/22 0820  LABPROT 16.2*  INR 1.3*   Hepatitis Panel No results for input(s): "HEPBSAG", "HCVAB", "HEPAIGM", "HEPBIGM" in the last 72 hours.    Studies/Results: DG Chest Portable 1 View  Result Date: 09/10/2022 CLINICAL DATA:  Provided history: Hypoxia. EXAM: PORTABLE CHEST 1 VIEW COMPARISON:  Prior chest radiographs 06/16/2022 and earlier. FINDINGS: Shallow inspiration radiograph, accentuating the cardiac silhouette and limiting evaluation of heart size. Aortic atherosclerosis. Bilateral perihilar and left lung base opacities, which may reflect atelectasis, edema and/or atypical/viral infection. No evidence of pleural effusion or pneumothorax. No acute osseous abnormality identified. Chronic widening of the left acromioclavicular joint. A vascular stent projects in the left subclavian/axillary region. IMPRESSION: Shallow inspiration radiograph. Bilateral perihilar and left lung base opacities which may reflect atelectasis, edema and/or atypical/viral infection. Electronically Signed   By: Kellie Simmering D.O.   On: 09/10/2022 12:17   CT ANGIO GI BLEED  Result Date: 09/10/2022 CLINICAL DATA:  Rectal bleeding EXAM: CTA ABDOMEN AND PELVIS WITHOUT AND WITH CONTRAST TECHNIQUE: Multidetector CT imaging of the abdomen and pelvis was performed using the standard protocol during bolus administration of intravenous contrast. Multiplanar reconstructed images and MIPs were obtained and reviewed to evaluate the vascular anatomy. RADIATION DOSE REDUCTION: This exam was performed according to the departmental dose-optimization program which includes automated exposure control, adjustment of the mA  and/or kV according to patient size and/or use of iterative reconstruction technique. CONTRAST:  75 mL OMNIPAQUE IOHEXOL 350 MG/ML SOLN COMPARISON:  08/19/2022 FINDINGS: VASCULAR Aorta: Dense atheromatous  calcifications. No aneurysm or dissection. Celiac: Atheromatous changes no evidence of occlusion. SMA: Atheromatous changes no evidence of occlusion. Renals: Atheromatous changes no evidence of occlusion. IMA: Atheromatous changes no evidence of occlusion. Inflow: Patent without evidence of aneurysm, dissection, vasculitis or significant stenosis. Proximal Outflow: Bilateral common femoral and visualized portions of the superficial and profunda femoral arteries are patent without evidence of aneurysm, dissection, vasculitis or significant stenosis. Veins: No obvious venous abnormality within the limitations of this arterial phase study. Review of the MIP images confirms the above findings. NON-VASCULAR Lower chest: Dependent basilar subsegmental atelectasis. No pleural or pericardial effusion identified. There is bilateral gynecomastia. Hepatobiliary: Distended gallbladder with layering milk calcium or calcified stones. No hepatic parenchymal abnormalities or biliary ductal dilatation identified. Pancreas: Unremarkable. No pancreatic ductal dilatation or surrounding inflammatory changes. Spleen: Normal in size without focal abnormality. Adrenals/Urinary Tract: No adrenal lesions. Kidneys are atrophic. There are numerous centimeter-sized cystic lesions in both kidneys that do not need further imaging follow up. No hydronephrosis or nephrolithiasis identified. Urinary bladder empty. Stomach/Bowel: Stomach is within normal limits. Appendix appears normal. No evidence of bowel wall thickening, distention, or inflammatory changes. Delayed postcontrast images demonstrate no evidence of extravasation of contrast indicate the presence of active GI hemorrhage. Lymphatic: No suspicious adenopathy identified. Reproductive: Prostate brachytherapy seeds noted. Prostate appears mildly prominent. Other: Small abdominal wall periumbilical defect containing omental fat without evidence of incarceration Musculoskeletal: Diffuse  osteoblastic changes with the differential including diffuse metastatic disease versus renal osteodystrophy. IMPRESSION: VASCULAR 1. Dense atheromatous changes noted for the aorta and its branches without evidence of significant occlusive changes, aneurysm or dissection. NON-VASCULAR 1. No evidence of extravasation of IV contrast to indicate the presence of active GI bleed. 2. Sequelae of end-stage renal disease. 3. Distended gallbladder with evidence of stones or sludge. 4. 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 5. Bilateral gynecomastia. Electronically Signed   By: Sammie Bench M.D.   On: 09/10/2022 11:20    IMPRESSION/PLAN:  77 year old male presented to the ED with painless hematochezia.  CTA negative for active GI bleeding.  Hemoglobin 8.2 with baseline hemoglobin 9.1 - 9.3.  -Clear liquid diet -Check H&H every 6 hours x 24 hours -Transfuse for Hg < 7 -Monitor the patient closely for active GI bleeding -Diagnostic colonoscopy during this hospital admission  Chronic melenic stools, started after he started taking Velphoro.  He last took Pepto-Bismol 2 weeks ago. He remains on Eliquis, last dose taken 6pm on 2/28.  -Consider EGD at time of colonoscopy, await further recommendations per Dr. Lorenso Courier -Pantoprazole 40 mg IV daily  Intermittent diarrhea since 07/2022.  Received IV then oral antibiotics 06/2022 secondary to sepsis from a perirectal abscess status post I&D -Check C. 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

## 2022-09-10 NOTE — ED Notes (Signed)
Pt oxygen 84% on room air. Pt denies SOB with unlabored respirations.  RN sat pt upright and placed on 2-4L nasal canula. Oxygen 86% and RN placed pt on nonrebreather notified PA. Pt oxygen improved to 100% and pt was titrated off oxygen.

## 2022-09-10 NOTE — ED Provider Notes (Signed)
[facility] Provider Note   CSN: [id number] Arrival date & time: 09/10/22  0750     History  Chief Complaint  Patient presents with   Rectal Bleeding    Justin [name] is a 77 y.o. male.  HPI 77 year old male with a history of DM type II, ESRD, prostate cancer, paroxysmal A-fib on Eliquis presents to the ER with complaints of rectal bleeding which started last night.  He has had 2 episodes of bright red rectal bleeding.  He denies any prior history of GI bleed.  He reports having surgery on a "cyst", near his rectum in December (per chart review, appears to be irrigation debridement of a perirectal abscess).  Denies any dizziness or syncope.  He has been compliant with his Eliquis.    Home Medications Prior to Admission medications   Medication Sig Start Date End Date Taking? Authorizing Provider  allopurinol (ZYLOPRIM) 100 MG tablet Take 100 mg by mouth daily. 09/13/21   [provider]  amiodarone (PACERONE) 200 MG tablet Take 1 tablet (200 mg total) by mouth 2 (two) times daily. On 07/09/22, start amiodarone 200 mg once daily 06/26/22   Tat, Shanon Brow, MD  atorvastatin (LIPITOR) 40 MG tablet Take 40 mg by mouth daily.  10/30/14   [provider]  ELIQUIS 5 MG TABS tablet Take 5 mg by mouth 2 (two) times daily. 10/07/19   [provider]  HYDROcodone-acetaminophen (NORCO/VICODIN) 5-325 MG tablet Take 1 tablet by mouth every 6 (six) hours as needed for severe pain. 06/26/22   Orson Eva, MD  LOKELMA 10 g PACK packet Take 10 g by mouth 2 (two) times a week. 06/08/22   [provider]  Methoxy PEG-Epoetin Beta (MIRCERA IJ) Mircera 06/13/21 12/18/22  [provider]  metoprolol tartrate (LOPRESSOR) 25 MG tablet Take 0.5 tablets (12.5 mg total) by mouth 2 (two) times daily. 06/26/22   Orson Eva, MD  omeprazole (PRILOSEC) 20 MG capsule Take 20 mg by mouth every morning.     [provider]   pregabalin (LYRICA) 75 MG capsule Take 1 capsule (75 mg total) by mouth 2 (two) times daily. 06/26/22   Orson Eva, MD  VELPHORO 500 MG chewable tablet Chew 500 mg by mouth 2 (two) times daily. 06/03/22   [provider]      Allergies    Patient has no known allergies.    Review of Systems   Review of Systems Ten systems reviewed and are negative for acute change, except as noted in the HPI.   Physical Exam Updated Vital Signs BP 116/70   Pulse 67   Temp 97.9 F (36.6 C) (Oral)   Resp 15   SpO2 99%  Physical Exam Vitals and nursing note reviewed.  Constitutional:      General: He is not in acute distress.    Appearance: He is well-developed.  HENT:     Head: Normocephalic and atraumatic.  Eyes:     Conjunctiva/sclera: Conjunctivae normal.  Cardiovascular:     Rate and Rhythm: Normal rate and regular rhythm.     Heart sounds: No murmur heard. Pulmonary:     Effort: Pulmonary effort is normal. No respiratory distress.     Breath sounds: Normal breath sounds.  Abdominal:     Palpations: Abdomen is soft.     Tenderness: There is no abdominal tenderness.  Genitourinary:    Comments: Rectal exam with chaperone at bedside.  Notable large bright red clots  of blood near the anal sphincter Musculoskeletal:        General: No swelling.     Cervical back: Neck supple.  Skin:    General: Skin is warm and dry.     Capillary Refill: Capillary refill takes less than 2 seconds.     Findings: No lesion.  Neurological:     Mental Status: He is alert.  Psychiatric:        Mood and Affect: Mood normal.     ED Results / Procedures / Treatments   Labs (all labs ordered are listed, but only abnormal results are displayed) Labs Reviewed  CBC WITH DIFFERENTIAL/PLATELET - Abnormal; Notable for the following components:      Result Value   RBC 2.60 (*)    Hemoglobin 8.2 (*)    HCT 27.3 (*)    MCV 105.0 (*)    RDW 16.4 (*)    All other components within normal limits   COMPREHENSIVE METABOLIC PANEL - Abnormal; Notable for the following components:   Glucose, Bld 118 (*)    Creatinine, Ser 6.10 (*)    Calcium 8.1 (*)    Albumin 2.9 (*)    GFR, Estimated 9 (*)    All other components within normal limits  PROTIME-INR - Abnormal; Notable for the following components:   Prothrombin Time 16.2 (*)    INR 1.3 (*)    All other components within normal limits  I-STAT CHEM 8, ED - Abnormal; Notable for the following components:   Potassium 3.3 (*)    Creatinine, Ser 7.10 (*)    Glucose, Bld 110 (*)    Calcium, Ion 1.03 (*)    TCO2 33 (*)    Hemoglobin 9.9 (*)    HCT 29.0 (*)    All other components within normal limits  HEMOGLOBIN AND HEMATOCRIT, BLOOD  TYPE AND SCREEN    EKG None  Radiology DG Chest Portable 1 View  Result Date: 09/10/2022 CLINICAL DATA:  Provided history: Hypoxia. EXAM: PORTABLE CHEST 1 VIEW COMPARISON:  Prior chest radiographs 06/16/2022 and earlier. FINDINGS: Shallow inspiration radiograph, accentuating the cardiac silhouette and limiting evaluation of heart size. Aortic atherosclerosis. Bilateral perihilar and left lung base opacities, which may reflect atelectasis, edema and/or atypical/viral infection. No evidence of pleural effusion or pneumothorax. No acute osseous abnormality identified. Chronic widening of the left acromioclavicular joint. A vascular stent projects in the left subclavian/axillary region. IMPRESSION: Shallow inspiration radiograph. Bilateral perihilar and left lung base opacities which may reflect atelectasis, edema and/or atypical/viral infection. Electronically Signed   By: Kellie Simmering D.O.   On: 09/10/2022 12:17   CT ANGIO GI BLEED  Result Date: 09/10/2022 CLINICAL DATA:  Rectal bleeding EXAM: CTA ABDOMEN AND PELVIS WITHOUT AND WITH CONTRAST TECHNIQUE: Multidetector CT imaging of the abdomen and pelvis was performed using the standard protocol during bolus administration of intravenous contrast. Multiplanar  reconstructed images and MIPs were obtained and reviewed to evaluate the vascular anatomy. RADIATION DOSE REDUCTION: This exam was performed according to the departmental dose-optimization program which includes automated exposure control, adjustment of the mA and/or kV according to patient size and/or use of iterative reconstruction technique. CONTRAST:  75 mL OMNIPAQUE IOHEXOL 350 MG/ML SOLN COMPARISON:  08/19/2022 FINDINGS: VASCULAR Aorta: Dense atheromatous calcifications. No aneurysm or dissection. Celiac: Atheromatous changes no evidence of occlusion. SMA: Atheromatous changes no evidence of occlusion. Renals: Atheromatous changes no evidence of occlusion. IMA: Atheromatous changes no evidence of occlusion. Inflow: Patent without evidence of aneurysm, dissection, vasculitis or  significant stenosis. Proximal Outflow: Bilateral common femoral and visualized portions of the superficial and profunda femoral arteries are patent without evidence of aneurysm, dissection, vasculitis or significant stenosis. Veins: No obvious venous abnormality within the limitations of this arterial phase study. Review of the MIP images confirms the above findings. NON-VASCULAR Lower chest: Dependent basilar subsegmental atelectasis. No pleural or pericardial effusion identified. There is bilateral gynecomastia. Hepatobiliary: Distended gallbladder with layering milk calcium or calcified stones. No hepatic parenchymal abnormalities or biliary ductal dilatation identified. Pancreas: Unremarkable. No pancreatic ductal dilatation or surrounding inflammatory changes. Spleen: Normal in size without focal abnormality. Adrenals/Urinary Tract: No adrenal lesions. Kidneys are atrophic. There are numerous centimeter-sized cystic lesions in both kidneys that do not need further imaging follow up. No hydronephrosis or nephrolithiasis identified. Urinary bladder empty. Stomach/Bowel: Stomach is within normal limits. Appendix appears normal. No  evidence of bowel wall thickening, distention, or inflammatory changes. Delayed postcontrast images demonstrate no evidence of extravasation of contrast indicate the presence of active GI hemorrhage. Lymphatic: No suspicious adenopathy identified. Reproductive: Prostate brachytherapy seeds noted. Prostate appears mildly prominent. Other: Small abdominal wall periumbilical defect containing omental fat without evidence of incarceration Musculoskeletal: Diffuse osteoblastic changes with the differential including diffuse metastatic disease versus renal osteodystrophy. IMPRESSION: VASCULAR 1. Dense atheromatous changes noted for the aorta and its branches without evidence of significant occlusive changes, aneurysm or dissection. NON-VASCULAR 1. No evidence of extravasation of IV contrast to indicate the presence of active GI bleed. 2. Sequelae of end-stage renal disease. 3. Distended gallbladder with evidence of stones or sludge. 4. 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 5. Bilateral gynecomastia. Electronically Signed   By: Sammie Bench M.D.   On: 09/10/2022 11:20    Procedures Procedures    Medications Ordered in ED Medications  allopurinol (ZYLOPRIM) tablet 100 mg (has no administration in time range)  amiodarone (PACERONE) tablet 200 mg (has no administration in time range)  atorvastatin (LIPITOR) tablet 40 mg (has no administration in time range)  metoprolol tartrate (LOPRESSOR) tablet 12.5 mg (has no administration in time range)  pantoprazole (PROTONIX) EC tablet 40 mg (has no administration in time range)  sodium chloride flush (NS) 0.9 % injection 3 mL (has no administration in time range)  acetaminophen (TYLENOL) tablet 650 mg (has no administration in time range)    Or  acetaminophen (TYLENOL) suppository 650 mg (has no administration in time range)  polyethylene glycol (MIRALAX / GLYCOLAX) packet 17 g (has no administration in time  range)  iohexol (OMNIPAQUE) 350 MG/ML injection 75 mL (75 mLs Intravenous Contrast Given 09/10/22 1057)    ED Course/ Medical Decision Making/ A&P                             Medical Decision Making Amount and/or Complexity of Data Reviewed Labs: ordered. Radiology: ordered.  Risk Prescription drug management. Decision regarding hospitalization.  77 year old gentleman presenting with lower GI bleeding x 1 day.  Passing large clots on exam.  Vitals are stable.  Labs ordered, reviewed, hemoglobin slightly down from 8.93 weeks ago to 8.2 today.  Has been compliant with dialysis this.  CT angio GI bleed ordered, reviewed, agree with radiology read, with no evidence of acute bleed.  Stable sick changes consistent with renal osteodystrophy or potential metastatic disease seen. Patient typed and crossed.  Discussed with GI who will see the patient.  Plan for admission for close monitoring given active bleeding.  Incidentally, nursing staff approached me with concerns for hypoxia.  He denies any chest pain or shortness of breath.  He was placed on a nonrebreather by nursing staff, on the monitor was reading 100% O2 sats with good pleth.  I weaned him down off the nonrebreather onto room air and he did not show any evidence of hypoxia.  Several hours later nursing staff to begin that the patient desatted to 82% while sleeping.  He was placed on 2 L nasal cannula.  Will obtain chest x-ray, however I do question if this is an erroneous reading as he is completely asymptomatic.  Chest x-ray reviewed, agree with radiology read, perihilar and left lung base opacities which may be atelectasis, edema or typical overall infection.  He is not symptomatic and I have low suspicion for infection at this time.  Consulted hospitalist for admission.  Discussed with hospitalist who will admit for further evaluation and treatment Final Clinical Impression(s) / ED Diagnoses Final diagnoses:  Acute GI bleeding     Rx / DC Orders ED Discharge Orders     None         Garald Balding, PA-C 09/10/22 [place], Gwenyth Allegra, MD 09/10/22 1558

## 2022-09-10 NOTE — Care Plan (Signed)
Patient received to room 10.  He is A/O.  He is able to ambulate to bed with mod assist.  He uses a walker at home for ambulation.  He has a wound to the right medial posterior flank.  It does not appear as a PU.  It is slightly scant bleeding noted to foam pad.  He denies use of O2 at home.  He states he no longer produces urine due to ESRD and dialysis.  Currently no blood is noted in brief or on bed.  Will continue to monitor and assess orders. Sreece, RN

## 2022-09-10 NOTE — Care Plan (Signed)
SBAR noted from ED.  Ready to receive patient. Sreece, RN

## 2022-09-11 ENCOUNTER — Other Ambulatory Visit: Payer: Self-pay

## 2022-09-11 ENCOUNTER — Encounter (HOSPITAL_COMMUNITY): Payer: Self-pay | Admitting: Internal Medicine

## 2022-09-11 ENCOUNTER — Observation Stay (HOSPITAL_COMMUNITY): Payer: Medicare Other | Admitting: Anesthesiology

## 2022-09-11 ENCOUNTER — Encounter (HOSPITAL_COMMUNITY)
Admission: EM | Disposition: A | Discharge status: Home / Self Care | Payer: Self-pay | Source: Home / Self Care | Attending: Internal Medicine

## 2022-09-11 ENCOUNTER — Observation Stay (HOSPITAL_BASED_OUTPATIENT_CLINIC_OR_DEPARTMENT_OTHER): Payer: Medicare Other | Admitting: Anesthesiology

## 2022-09-11 DIAGNOSIS — D123 Benign neoplasm of transverse colon: Secondary | ICD-10-CM | POA: Diagnosis not present

## 2022-09-11 DIAGNOSIS — K627 Radiation proctitis: Principal | ICD-10-CM

## 2022-09-11 DIAGNOSIS — K922 Gastrointestinal hemorrhage, unspecified: Secondary | ICD-10-CM | POA: Diagnosis not present

## 2022-09-11 DIAGNOSIS — K449 Diaphragmatic hernia without obstruction or gangrene: Secondary | ICD-10-CM | POA: Diagnosis not present

## 2022-09-11 DIAGNOSIS — K317 Polyp of stomach and duodenum: Secondary | ICD-10-CM

## 2022-09-11 DIAGNOSIS — D120 Benign neoplasm of cecum: Secondary | ICD-10-CM

## 2022-09-11 DIAGNOSIS — K3189 Other diseases of stomach and duodenum: Secondary | ICD-10-CM

## 2022-09-11 DIAGNOSIS — D649 Anemia, unspecified: Secondary | ICD-10-CM | POA: Diagnosis not present

## 2022-09-11 DIAGNOSIS — K921 Melena: Secondary | ICD-10-CM | POA: Diagnosis not present

## 2022-09-11 DIAGNOSIS — M1990 Unspecified osteoarthritis, unspecified site: Secondary | ICD-10-CM

## 2022-09-11 DIAGNOSIS — D126 Benign neoplasm of colon, unspecified: Secondary | ICD-10-CM

## 2022-09-11 DIAGNOSIS — K625 Hemorrhage of anus and rectum: Secondary | ICD-10-CM

## 2022-09-11 HISTORY — PX: COLONOSCOPY WITH PROPOFOL: SHX5780

## 2022-09-11 HISTORY — PX: POLYPECTOMY: SHX5525

## 2022-09-11 HISTORY — PX: HEMOSTASIS CLIP PLACEMENT: SHX6857

## 2022-09-11 HISTORY — PX: ESOPHAGOGASTRODUODENOSCOPY (EGD) WITH PROPOFOL: SHX5813

## 2022-09-11 HISTORY — PX: HOT HEMOSTASIS: SHX5433

## 2022-09-11 LAB — CBC
HCT: 28.4 % — ABNORMAL LOW (ref 39.0–52.0)
Hemoglobin: 8.4 g/dL — ABNORMAL LOW (ref 13.0–17.0)
MCH: 31.2 pg (ref 26.0–34.0)
MCHC: 29.6 g/dL — ABNORMAL LOW (ref 30.0–36.0)
MCV: 105.6 fL — ABNORMAL HIGH (ref 80.0–100.0)
Platelets: 195 10*3/uL (ref 150–400)
RBC: 2.69 MIL/uL — ABNORMAL LOW (ref 4.22–5.81)
RDW: 16.7 % — ABNORMAL HIGH (ref 11.5–15.5)
WBC: 6.9 10*3/uL (ref 4.0–10.5)
nRBC: 0 % (ref 0.0–0.2)

## 2022-09-11 LAB — COMPREHENSIVE METABOLIC PANEL
ALT: 11 U/L (ref 0–44)
AST: 16 U/L (ref 15–41)
Albumin: 2.9 g/dL — ABNORMAL LOW (ref 3.5–5.0)
Alkaline Phosphatase: 87 U/L (ref 38–126)
Anion gap: 16 — ABNORMAL HIGH (ref 5–15)
BUN: 27 mg/dL — ABNORMAL HIGH (ref 8–23)
CO2: 28 mmol/L (ref 22–32)
Calcium: 8.4 mg/dL — ABNORMAL LOW (ref 8.9–10.3)
Chloride: 98 mmol/L (ref 98–111)
Creatinine, Ser: 7.92 mg/dL — ABNORMAL HIGH (ref 0.61–1.24)
GFR, Estimated: 6 mL/min — ABNORMAL LOW (ref >60)
Glucose, Bld: 103 mg/dL — ABNORMAL HIGH (ref 70–99)
Potassium: 3.6 mmol/L (ref 3.5–5.1)
Sodium: 142 mmol/L (ref 135–145)
Total Bilirubin: 0.6 mg/dL (ref 0.3–1.2)
Total Protein: 7.1 g/dL (ref 6.5–8.1)

## 2022-09-11 LAB — IRON AND TIBC
Iron: 54 ug/dL (ref 45–182)
Saturation Ratios: 27 % (ref 17.9–39.5)
TIBC: 200 ug/dL — ABNORMAL LOW (ref 250–450)
UIBC: 146 ug/dL

## 2022-09-11 LAB — RETICULOCYTES
Immature Retic Fract: 24.1 % — ABNORMAL HIGH (ref 2.3–15.9)
RBC.: 2.67 MIL/uL — ABNORMAL LOW (ref 4.22–5.81)
Retic Count, Absolute: 102.5 10*3/uL (ref 19.0–186.0)
Retic Ct Pct: 3.8 % — ABNORMAL HIGH (ref 0.4–3.1)

## 2022-09-11 LAB — HEPATITIS B SURFACE ANTIGEN: Hepatitis B Surface Ag: NONREACTIVE

## 2022-09-11 LAB — FERRITIN: Ferritin: 1126 ng/mL — ABNORMAL HIGH (ref 24–336)

## 2022-09-11 LAB — GLUCOSE, CAPILLARY
Glucose-Capillary: 122 mg/dL — ABNORMAL HIGH (ref 70–99)
Glucose-Capillary: 94 mg/dL (ref 70–99)

## 2022-09-11 LAB — VITAMIN B12: Vitamin B-12: 848 pg/mL (ref 180–914)

## 2022-09-11 LAB — FOLATE: Folate: 25.6 ng/mL (ref >5.9)

## 2022-09-11 SURGERY — ESOPHAGOGASTRODUODENOSCOPY (EGD) WITH PROPOFOL
Anesthesia: Monitor Anesthesia Care

## 2022-09-11 MED ORDER — ALBUMIN HUMAN 25 % IV SOLN
25.0000 g | Freq: Once | INTRAVENOUS | Status: AC
  Filled 2022-09-11: qty 100

## 2022-09-11 MED ORDER — LIDOCAINE-PRILOCAINE 2.5-2.5 % EX CREA
1.0000 | TOPICAL_CREAM | CUTANEOUS | Status: DC | PRN
  Filled 2022-09-11: qty 5

## 2022-09-11 MED ORDER — LIDOCAINE HCL (PF) 1 % IJ SOLN
5.0000 mL | INTRAMUSCULAR | Status: DC | PRN
  Filled 2022-09-11: qty 5

## 2022-09-11 MED ORDER — ANTICOAGULANT SODIUM CITRATE 4% (200MG/5ML) IV SOLN
5.0000 mL | Status: DC | PRN
  Filled 2022-09-11: qty 5

## 2022-09-11 MED ORDER — PENTAFLUOROPROP-TETRAFLUOROETH EX AERO: 1.0000 | INHALATION_SPRAY | CUTANEOUS | Status: DC | PRN

## 2022-09-11 MED ORDER — CHLORHEXIDINE GLUCONATE CLOTH 2 % EX PADS
6.0000 | MEDICATED_PAD | Freq: Every day | CUTANEOUS | Status: DC
  Administered 2022-09-12: 6 via TOPICAL

## 2022-09-11 MED ORDER — ALTEPLASE 2 MG IJ SOLR: 2.0000 mg | Freq: Once | INTRAMUSCULAR | Status: DC | PRN

## 2022-09-11 MED ORDER — PROPOFOL 10 MG/ML IV BOLUS
INTRAVENOUS | Status: DC | PRN
  Administered 2022-09-11: 20 mg via INTRAVENOUS

## 2022-09-11 MED ORDER — HEPARIN SODIUM (PORCINE) 1000 UNIT/ML DIALYSIS
1000.0000 [IU] | INTRAMUSCULAR | Status: DC | PRN
  Filled 2022-09-11: qty 1

## 2022-09-11 MED ORDER — MIDODRINE HCL 5 MG PO TABS
ORAL_TABLET | ORAL | Status: AC
  Administered 2022-09-11: 5 mg via ORAL
  Filled 2022-09-11: qty 1

## 2022-09-11 MED ORDER — LIDOCAINE 2% (20 MG/ML) 5 ML SYRINGE
INTRAMUSCULAR | Status: DC | PRN
  Administered 2022-09-11: 40 mg via INTRAVENOUS

## 2022-09-11 MED ORDER — MIDODRINE HCL 5 MG PO TABS: 5.0000 mg | ORAL_TABLET | Freq: Once | ORAL | Status: AC | PRN

## 2022-09-11 MED ORDER — EPHEDRINE SULFATE (PRESSORS) 50 MG/ML IJ SOLN
INTRAMUSCULAR | Status: DC | PRN
  Administered 2022-09-11 (×4): 10 mg via INTRAVENOUS

## 2022-09-11 MED ORDER — ALBUMIN HUMAN 25 % IV SOLN
INTRAVENOUS | Status: AC
  Administered 2022-09-11: 25 g
  Filled 2022-09-11: qty 100

## 2022-09-11 MED ORDER — PREGABALIN 75 MG PO CAPS: 75.0000 mg | ORAL_CAPSULE | Freq: Every day | ORAL | Status: DC

## 2022-09-11 MED ORDER — PROPOFOL 500 MG/50ML IV EMUL
INTRAVENOUS | Status: DC | PRN
  Administered 2022-09-11: 100 ug/kg/min via INTRAVENOUS

## 2022-09-11 MED ORDER — PHENYLEPHRINE HCL-NACL 20-0.9 MG/250ML-% IV SOLN
INTRAVENOUS | Status: DC | PRN
  Administered 2022-09-11: 25 ug/min via INTRAVENOUS

## 2022-09-11 SURGICAL SUPPLY — 25 items

## 2022-09-11 NOTE — Interval H&P Note (Signed)
History and Physical Interval Note:  09/11/2022 10:48 AM  Justin [name]  has presented today for surgery, with the diagnosis of Hematochezia, melena, anemia.  The various methods of treatment have been discussed with the patient and family. After consideration of risks, benefits and other options for treatment, the patient has consented to  Procedure(s): ESOPHAGOGASTRODUODENOSCOPY (EGD) WITH PROPOFOL (N/A) COLONOSCOPY WITH PROPOFOL (N/A) as a surgical intervention.  The patient's history has been reviewed, patient examined, no change in status, stable for surgery.  I have reviewed the patient's chart and labs.  Questions were answered to the patient's satisfaction.     Sharyn Creamer

## 2022-09-11 NOTE — Progress Notes (Signed)
Received patient in bed to unit.  Alert and orientedx4 Informed consent signed and in chart.   TX duration:3.75 hours  Previous TX did not work previous notes.  Transported back to the room  Alert, without acute distress.  Hand-off given to patient's nurse.   Access used: AVF Access issues: none  Total UF removed: 2000L Medication(s) given: midodrine '5mg'$  po, albumin 25% see MAR Post HD VS: see table below Post HD weight: 100.5kg bed scale   09/11/22 2300  Vitals  BP (!) 108/45  MAP (mmHg) (!) 62  Pulse Rate 73  ECG Heart Rate 73  Resp 18  Oxygen Therapy  SpO2 96 %  During Treatment Monitoring  HD Safety Checks Performed Yes  Intra-Hemodialysis Comments See progress note  Post Treatment  Dialyzer Clearance Heavily streaked  Duration of HD Treatment -hour(s) 3.75 hour(s)  Hemodialysis Intake (mL) 200 mL  Liters Processed 82.1  Fluid Removed (mL) 2000 mL  Tolerated HD Treatment Yes  Post-Hemodialysis Comments goal met  AVG/AVF Arterial Site Held (minutes) 8 minutes  AVG/AVF Venous Site Held (minutes) 8 minutes  Fistula / Graft Left Upper arm Arteriovenous fistula  No Placement Date or Time found.   Placed prior to admission: Yes  Orientation: Left  Access Location: Upper arm  Access Type: Arteriovenous fistula  Site Condition No complications  Fistula / Graft Assessment Present;Bruit;Thrill  Status Deaccessed;Flushed;Patent      [facility]

## 2022-09-11 NOTE — Op Note (Signed)
[facility] Patient Name: Justin [name] Procedure Date : 09/11/2022 MRN: [id number] Attending MD: Georgian Co , , [id number] Date of Birth: [birth year] CSN: [id number] Age: 77 Admit Type: Inpatient Procedure:                Upper GI endoscopy Indications:              Hematochezia, Melena, Anemia Providers:                Adline Mango" Georga Hacking, RN, Tyrone Apple, Technician, Theodoro Grist, CRNA Referring MD:             Hospitalist team Medicines:                Monitored Anesthesia Care Complications:            No immediate complications. Estimated Blood Loss:     Estimated blood loss: none. Procedure:                Pre-Anesthesia Assessment:                           - Prior to the procedure, a History and Physical                            was performed, and patient medications and                            allergies were reviewed. The patient's tolerance of                            previous anesthesia was also reviewed. The risks                            and benefits of the procedure and the sedation                            options and risks were discussed with the patient.                            All questions were answered, and informed consent                            was obtained. Prior Anticoagulants: The patient has                            taken Eliquis (apixaban), last dose was 2 days                            prior to procedure. ASA Grade Assessment: III - A                            patient with severe systemic disease. After  reviewing the risks and benefits, the patient was                            deemed in satisfactory condition to undergo the                            procedure.                           After obtaining informed consent, the endoscope was                            passed under direct vision. Throughout the                             procedure, the patient's blood pressure, pulse, and                            oxygen saturations were monitored continuously. The                            [id number] Olympus endoscope was introduced                            through the mouth, and advanced to the second part                            of duodenum. The upper GI endoscopy was                            accomplished without difficulty. The patient                            tolerated the procedure well. Scope In: Scope Out: Findings:      The examined esophagus was normal.      A hiatal hernia was present.      A single 25 mm submucosal papule (nodule) with no bleeding and no       stigmata of recent bleeding was found in the gastric fundus.      Two 5 to 15 mm sessile polyps with no bleeding were found in the second       portion of the duodenum. Impression:               - Normal esophagus.                           - Hiatal hernia.                           - A single submucosal papule (nodule) found in the                            stomach.                           - Two duodenal polyps.                           -  No specimens collected. Recommendation:           - Patient's small bowel polyps and stomach nodule                            were not biopsied today due to concern for acute GI                            bleed.                           - Patient will need either repeat EGD and/or EUS in                            the future for further evaluation of his stomach                            nodule and small bowel polyps.                           - Perform a colonoscopy today. Procedure Code(s):        --- Professional ---                           [id number], Esophagogastroduodenoscopy, flexible,                            transoral; diagnostic, including collection of                            specimen(s) by brushing or washing, when performed                            (separate  procedure) Diagnosis Code(s):        --- Professional ---                           K44.9, Diaphragmatic hernia without obstruction or                            gangrene                           K31.89, Other diseases of stomach and duodenum                           K31.7, Polyp of stomach and duodenum                           K92.1, Melena (includes Hematochezia)                           D64.9, Anemia, unspecified CPT copyright 2022 [employer]. All rights reserved. The codes documented in this report are preliminary and upon coder review may  be revised to meet current compliance requirements. Dr Georgian Co "Lyndee Leo" Lorenso Courier,  09/11/2022 12:34:02 PM Number of Addenda: 0

## 2022-09-11 NOTE — Anesthesia Procedure Notes (Signed)
Procedure Name: MAC Date/Time: 09/11/2022 11:24 AM  Performed by: Lavell Luster, CRNAPre-anesthesia Checklist: Patient identified, Emergency Drugs available, Suction available, Patient being monitored and Timeout performed Patient Re-evaluated:Patient Re-evaluated prior to induction Oxygen Delivery Method: Nasal cannula Preoxygenation: Pre-oxygenation with 100% oxygen Induction Type: IV induction Placement Confirmation: breath sounds checked- equal and bilateral and positive ETCO2 Dental Injury: Teeth and Oropharynx as per pre-operative assessment

## 2022-09-11 NOTE — Anesthesia Procedure Notes (Signed)
Procedure Name: MAC Date/Time: 09/11/2022 11:47 AM  Performed by: Lavell Luster, CRNAPre-anesthesia Checklist: Patient identified, Emergency Drugs available, Suction available, Patient being monitored and Timeout performed Patient Re-evaluated:Patient Re-evaluated prior to induction Oxygen Delivery Method: Simple face mask and Nasal cannula Preoxygenation: Pre-oxygenation with 100% oxygen Induction Type: IV induction Placement Confirmation: breath sounds checked- equal and bilateral and positive ETCO2 Dental Injury: Teeth and Oropharynx as per pre-operative assessment

## 2022-09-11 NOTE — Progress Notes (Signed)
PROGRESS NOTE  Justin [name] [id number] DOB: 09-Nov-1945 DOA: 09/10/2022 PCP: Michell Heinrich, DO   LOS: 0 days   Brief Narrative / Interim history: 77 year old male with DM2, PAF, HTN, ESRD on HD, anemia, tachycardia, obesity, prostate cancer presents with rectal bleeding.  This started the day prior to admission, he said several episodes of bright red blood per rectum.  He did not have any abdominal pain, no nausea or vomiting.  He has not had prior history of GI bleeds.  GI was consulted and he was admitted to the hospital  Subjective / 24h Interval events: Doing well this morning, awaiting procedures  Assesement and Plan: Principal Problem:   Acute GI bleeding Active Problems:   Essential hypertension   DM (diabetes mellitus) (HCC)   Anemia in chronic kidney disease   ESRD on dialysis (HCC)   Paroxysmal A-fib (HCC)   GERD (gastroesophageal reflux disease)   Gouty arthropathy   Mixed hyperlipidemia   Obesity (BMI 30-39.9)  Principal problem Lower GI bleed - with bright red blood per rectum, gastroenterology consulted.  He underwent an EGD on 3/1 which showed a small stomach nodule as well as 2 duodenal polyps.  These were not removed in the setting of acute GI bleed.  GI recommends repeat EGD/EUS as an outpatient for further evaluation.  He also had a colonoscopy which showed a 15 mm polyp in the cecum, sessile, not removed due to concern for GI bleed, as well as a 10 mm polyp in the transverse colon with stigmata of recent bleeding.  This was removed with a hot snare.  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  The most likely source for his recent bleed, now status post APC.  Discussed with gastroenterology, will monitor patient overnight, monitor hemoglobin, and could resume Eliquis tomorrow  Active problems Acute blood loss anemia, anemia of chronic kidney disease -hemoglobin has overall remained stable, in the 8 range.  Patient has not had any  further bleeding.  Repeat in the morning  ESRD -on HD MWF, nephrology consulted, he will have dialysis later today  Essential hypertension -continue home metoprolol  Paroxysmal A-fib/flutter, history of wide-complex VT -continue amiodarone.  Currently Eliquis is on hold  Gout -continue allopurinol  Obesity -BMI 33, he would benefit from weight loss  History of prostate cancer -noted  DM2 -controlled  Lab Results  Component Value Date   HGBA1C 6.5 (H) 06/16/2022     Scheduled Meds:  [MAR Hold] allopurinol  100 mg Oral Daily   [MAR Hold] amiodarone  200 mg Oral Daily   [MAR Hold] atorvastatin  40 mg Oral Daily   [MAR Hold] Chlorhexidine Gluconate Cloth  6 each Topical Q0600   [MAR Hold] metoprolol tartrate  12.5 mg Oral BID   [MAR Hold] pantoprazole  40 mg Oral Daily   [MAR Hold] pregabalin  75 mg Oral BID   [MAR Hold] sodium chloride flush  3 mL Intravenous Q12H   [MAR Hold] sucroferric oxyhydroxide  500 mg Oral BID WC   Continuous Infusions:  sodium chloride 20 mL/hr at 09/11/22 1118   PRN Meds:.[MAR Hold] acetaminophen **OR** [MAR Hold] acetaminophen, [MAR Hold] polyethylene glycol  Current Outpatient Medications  Medication Instructions   allopurinol (ZYLOPRIM) 100 mg, Oral, Daily   amiodarone (PACERONE) 200 mg, Oral, 2 times daily, On 07/09/22, start amiodarone 200 mg once daily   atorvastatin (LIPITOR) 40 mg, Oral, Daily   Eliquis 5 mg, Oral, 2 times daily   HYDROcodone-acetaminophen (  NORCO/VICODIN) 5-325 MG tablet 1 tablet, Oral, Every 6 hours PRN   Lokelma 10 g, Oral, 2 times weekly   Methoxy PEG-Epoetin Beta (MIRCERA IJ) Mircera   metoprolol tartrate (LOPRESSOR) 12.5 mg, Oral, 2 times daily   omeprazole (PRILOSEC) 20 mg, Oral, BH-each morning,     pregabalin (LYRICA) 75 mg, Oral, 2 times daily   Velphoro 500 mg, Oral, 2 times daily    Diet Orders (From admission, onward)     Start     Ordered   09/11/22 0001  Diet NPO time specified  Diet effective  midnight        09/10/22 1453            DVT prophylaxis: SCDs Start: 09/10/22 1310   Lab Results  Component Value Date   PLT 195 09/11/2022      Code Status: Full Code  Family Communication: no family at bedside   Status is: Observation  The patient will require care spanning > 2 midnights and should be moved to inpatient because: monitor Hb   Level of care: Telemetry Medical  Consultants:  GI Nephrology    Objective: Vitals:   09/11/22 1000 09/11/22 1234 09/11/22 1245 09/11/22 1300  BP: 138/75 (!) 119/47 (!) 134/48 (!) 145/53  Pulse: 67 70 70 67  Resp: '18 18 20 18  '$ Temp: 97.7 F (36.5 C) 97.7 F (36.5 C)  97.7 F (36.5 C)  TempSrc: Temporal     SpO2: 94% 94% 96% 91%  Weight: 104.3 kg     Height: '5\' 10"'$  (1.778 m)       Intake/Output Summary (Last 24 hours) at 09/11/2022 1326 Last data filed at 09/11/2022 1210 Gross per 24 hour  Intake 774.12 ml  Output --  Net 774.12 ml   Wt Readings from Last 3 Encounters:  09/11/22 104.3 kg  06/26/22 103.4 kg  05/12/22 106.1 kg    Examination:  Constitutional: NAD Eyes: no scleral icterus ENMT: Mucous membranes are moist.  Neck: normal, supple Respiratory: clear to auscultation bilaterally, no wheezing, no crackles. Normal respiratory effort. No accessory muscle use.  Cardiovascular: Regular rate and rhythm, no murmurs / rubs / gallops. No LE edema.  Abdomen: non distended, no tenderness. Bowel sounds positive.  Musculoskeletal: no clubbing / cyanosis.  Skin: no rashes  Data Reviewed: I have independently reviewed following labs and imaging studies   CBC Recent Labs  Lab 09/10/22 0820 09/10/22 0849 09/10/22 1215 09/11/22 0616  WBC 7.2  --   --  6.9  HGB 8.2* 9.9* 7.8* 8.4*  HCT 27.3* 29.0* 26.9* 28.4*  PLT 193  --   --  195  MCV 105.0*  --   --  105.6*  MCH 31.5  --   --  31.2  MCHC 30.0  --   --  29.6*  RDW 16.4*  --   --  16.7*  LYMPHSABS 1.9  --   --   --   MONOABS 1.0  --   --   --    EOSABS 0.2  --   --   --   BASOSABS 0.1  --   --   --     Recent Labs  Lab 09/10/22 0820 09/10/22 0849 09/11/22 0616  NA 141 141 142  K 3.8 3.3* 3.6  CL 98 98 98  CO2 28  --  28  GLUCOSE 118* 110* 103*  BUN 20 21 27*  CREATININE 6.10* 7.10* 7.92*  CALCIUM 8.1*  --  8.4*  AST  34  --  16  ALT 13  --  11  ALKPHOS 89  --  87  BILITOT 0.6  --  0.6  ALBUMIN 2.9*  --  2.9*  INR 1.3*  --   --     ------------------------------------------------------------------------------------------------------------------ No results for input(s): "CHOL", "HDL", "LDLCALC", "TRIG", "CHOLHDL", "LDLDIRECT" in the last 72 hours.  Lab Results  Component Value Date   HGBA1C 6.5 (H) 06/16/2022   ------------------------------------------------------------------------------------------------------------------ No results for input(s): "TSH", "T4TOTAL", "T3FREE", "THYROIDAB" in the last 72 hours.  Invalid input(s): "FREET3"  Cardiac Enzymes No results for input(s): "CKMB", "TROPONINI", "MYOGLOBIN" in the last 168 hours.  Invalid input(s): "CK" ------------------------------------------------------------------------------------------------------------------ No results found for: "BNP"  CBG: Recent Labs  Lab 09/11/22 1238  GLUCAP 94    No results found for this or any previous visit (from the past 240 hour(s)).   Radiology Studies: No results found.   Marzetta Board, MD, PhD [facility]  Between 7 am - 7 pm I am available, please contact me via Amion (for emergencies) or Securechat (non urgent messages)  Between 7 pm - 7 am I am not available, please contact night coverage MD/APP via Amion

## 2022-09-11 NOTE — Op Note (Addendum)
[facility] Patient Name: Justin [name] Procedure Date : 09/11/2022 MRN: [id number] Attending MD: Georgian Co , , [id number] Date of Birth: 03-Oct-1945 CSN: [id number] Age: 77 Admit Type: Inpatient Procedure:                Colonoscopy Indications:              Hematochezia, Melena Providers:                Adline Mango" Georga Hacking, RN, Tyrone Apple, Technician, Theodoro Grist, CRNA Referring MD:             Hospitalist team Medicines:                Monitored Anesthesia Care Complications:            No immediate complications. Estimated Blood Loss:     Estimated blood loss was minimal. Procedure:                Pre-Anesthesia Assessment:                           - Prior to the procedure, a History and Physical                            was performed, and patient medications and                            allergies were reviewed. The patient's tolerance of                            previous anesthesia was also reviewed. The risks                            and benefits of the procedure and the sedation                            options and risks were discussed with the patient.                            All questions were answered, and informed consent                            was obtained. Prior Anticoagulants: The patient has                            taken Eliquis (apixaban), last dose was 2 days                            prior to procedure. ASA Grade Assessment: III - A                            patient with severe systemic disease. After  reviewing the risks and benefits, the patient was                            deemed in satisfactory condition to undergo the                            procedure.                           After obtaining informed consent, the colonoscope                            was passed under direct vision. Throughout the                            procedure,  the patient's blood pressure, pulse, and                            oxygen saturations were monitored continuously. The                            [id number] Olympus peds colonoscope was                            introduced through the anus and advanced to the the                            cecum, identified by appendiceal orifice and                            ileocecal valve. The colonoscopy was performed                            without difficulty. The patient tolerated the                            procedure well. The quality of the bowel                            preparation was inadequate. The ileocecal valve,                            appendiceal orifice, and rectum were photographed. Scope In: 11:45:11 AM Scope Out: 12:24:33 PM Scope Withdrawal Time: 0 hours 31 minutes 42 seconds  Total Procedure Duration: 0 hours 39 minutes 22 seconds  Findings:      A 15 mm polyp was found in the cecum. The polyp was sessile. Not removed       due to concern for GI bleed.      A 10 mm polyp was found in the transverse colon. The polyp was       semi-pedunculated and had stigmata of recent bleeding. Due to concern       that it may have contributed bleeding, the polyp was removed with a hot       snare. Resection and retrieval were complete. To prevent  bleeding after       the polypectomy, one hemostatic clip was successfully placed. There was       no bleeding at the end of the procedure.      Clotted blood was found in the rectum. This was cleared with lavage.      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 This was actively bleeding. Coagulation for       bleeding prevention using argon plasma at 2 liters/minute and 20 watts       was successful. Impression:               - Preparation of the colon was inadequate.                           - One 15 mm  polyp in the cecum. Not removed today                            due to concern for GI bleed.                           - One 10 mm polyp in the transverse colon with                            stigmata of recent bleeding, removed with a hot                            snare. Resected and retrieved. Clip was placed.                           - Blood in the rectum. Cleared with lavage.                           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 Actively                            bleeding. Treated with argon plasma coagulation                            (APC). Recommendation:           - Return patient to hospital ward for ongoing care.                           - It is suspected that the patient's rectal                            bleeding is primarily due to bleeding from                            radiation proctitis.                           -  Await pathology results.                           - Okay to restart Eliquis tomorrow.                           - Patient will need to undergo repeat colonoscopy                            in the future with a 2-day prep for removal of the                            cecal polyp and for colon cancer screening since                            today's prep was inadequate.                           - We will arrange for GI follow up.                           - The findings and recommendations were discussed                            with the patient. Procedure Code(s):        --- Professional ---                           [id number], 59, Colonoscopy, flexible; with control of                            bleeding, any method                           45385, Colonoscopy, flexible; with removal of                            tumor(s), polyp(s), or other lesion(s) by snare                            technique Diagnosis Code(s):        --- Professional ---                            D12.0, Benign neoplasm of cecum                           D12.3, Benign neoplasm of transverse colon (hepatic                            flexure or splenic flexure)                           K62.5, Hemorrhage of anus and rectum  K62.7, Radiation proctitis                           K92.1, Melena (includes Hematochezia) CPT copyright 2022 [employer]. All rights reserved. The codes documented in this report are preliminary and upon coder review may  be revised to meet current compliance requirements. Dr Georgian Co "Lyndee Leo" Lorenso Courier,  09/11/2022 12:48:43 PM Number of Addenda: 0

## 2022-09-11 NOTE — Care Plan (Signed)
Pt NPO all night, continued bloody stools.  Consent signed for EGD/Colon.  NADN.  Sreeece, RN

## 2022-09-11 NOTE — Significant Event (Signed)
Rapid Response Event Note   Reason for Call :  Hypotension  Per HD RN, pt received HD tx removing 2L. During tx, pt dropped SBP to 80s. At that time, fluid removal was stopped and pt was given 25g 25% albumin and a 200cc bolus. After tx completed, SBP dropped again and pt was given 25g 25% albumin as well as '5mg'$  midodrine Initial Focused Assessment:  Pt lying in bed with eyes open, in no visible distress. He is alert and oriented, denies chest pain, SOB, and dizziness. He does c/o ABD pain which he says is new. Lungs diminished t/o, ABD soft, tender. Skin cool to touch.  HR-57, SBP-60s(checked at multiple sites), RR-18, SpO2-93% on RA.   During assessment, pt vomited x 2 moderate amount clear fluid.   HR now 70s and SBP-100s.  Interventions:  Additional 25g 25% albumin ordered Plan of Care:  SBP now 100s. Give additional albumin. Okay to send pt back to 6N. Pt may benefit from more midodrine if BP drops again. Continue to monitor pt closely. Call RRT if further assistance needed.   Event Summary:   MD Notified: Zebedee Iba, NP Call [place] Time:2230  Dillard Essex, RN

## 2022-09-11 NOTE — Anesthesia Preprocedure Evaluation (Addendum)
Anesthesia Evaluation  Patient identified by MRN, date of birth, ID band Patient awake    Reviewed: Allergy & Precautions, NPO status , Patient's Chart, lab work & pertinent test results  Airway Mallampati: I  TM Distance: >3 FB Neck ROM: Full    Dental  (+) Teeth Intact, Dental Advisory Given   Pulmonary former smoker   breath sounds clear to auscultation       Cardiovascular hypertension, Pt. on home beta blockers + dysrhythmias + Valvular Problems/Murmurs  Rhythm:Regular Rate:Normal     Neuro/Psych negative neurological ROS  negative psych ROS   GI/Hepatic Neg liver ROS,GERD  Medicated,,  Endo/Other  diabetes    Renal/GU ESRF and DialysisRenal disease     Musculoskeletal  (+) Arthritis ,    Abdominal   Peds  Hematology   Anesthesia Other Findings   Reproductive/Obstetrics                             Anesthesia Physical Anesthesia Plan  ASA: 3  Anesthesia Plan: MAC   Post-op Pain Management: Minimal or no pain anticipated   Induction: Intravenous  PONV Risk Score and Plan: 0 and Propofol infusion  Airway Management Planned: Natural Airway and Nasal Cannula  Additional Equipment: None  Intra-op Plan:   Post-operative Plan:   Informed Consent: I have reviewed the patients History and Physical, chart, labs and discussed the procedure including the risks, benefits and alternatives for the proposed anesthesia with the patient or authorized representative who has indicated his/her understanding and acceptance.       Plan Discussed with: CRNA  Anesthesia Plan Comments:        Anesthesia Quick Evaluation

## 2022-09-11 NOTE — Transfer of Care (Signed)
Immediate Anesthesia Transfer of Care Note  Patient: [name]  Procedure(s) Performed: ESOPHAGOGASTRODUODENOSCOPY (EGD) WITH PROPOFOL COLONOSCOPY WITH PROPOFOL POLYPECTOMY HEMOSTASIS CLIP PLACEMENT HOT HEMOSTASIS (ARGON PLASMA COAGULATION/BICAP)  Patient Location: PACU  Anesthesia Type:MAC  Level of Consciousness: drowsy  Airway & Oxygen Therapy: Patient Spontanous Breathing and Patient connected to nasal cannula oxygen  Post-op Assessment: Report given to RN and Post -op Vital signs reviewed and stable  Post vital signs: Reviewed and stable  Last Vitals:  Vitals Value Taken Time  BP 119/47 09/11/22 1234  Temp    Pulse 71 09/11/22 1234  Resp 22 09/11/22 1234  SpO2 100 % 09/11/22 1234  Vitals shown include unvalidated device data.  Last Pain:  Vitals:   09/11/22 1000  TempSrc: Temporal  PainSc: 0-No pain         Complications: No notable events documented.

## 2022-09-11 NOTE — Anesthesia Postprocedure Evaluation (Signed)
Anesthesia Post Note  Patient: Justin [name]  Procedure(s) Performed: ESOPHAGOGASTRODUODENOSCOPY (EGD) WITH PROPOFOL COLONOSCOPY WITH PROPOFOL POLYPECTOMY HEMOSTASIS CLIP PLACEMENT HOT HEMOSTASIS (ARGON PLASMA COAGULATION/BICAP)     Patient location during evaluation: PACU Anesthesia Type: MAC Level of consciousness: awake and alert Pain management: pain level controlled Vital Signs Assessment: post-procedure vital signs reviewed and stable Respiratory status: spontaneous breathing, nonlabored ventilation, respiratory function stable and patient connected to nasal cannula oxygen Cardiovascular status: stable and blood pressure returned to baseline Postop Assessment: no apparent nausea or vomiting Anesthetic complications: no  No notable events documented.  Last Vitals:  Vitals:   09/11/22 1245 09/11/22 1300  BP: (!) 134/48 (!) 145/53  Pulse: 70 67  Resp: 20 18  Temp:  36.5 C  SpO2: 96% 91%    Last Pain:  Vitals:   09/11/22 1245  TempSrc:   PainSc: 0-No pain                 Effie Berkshire

## 2022-09-11 NOTE — Consult Note (Signed)
[facility] Renal Consultation Note    Indication for Consultation:  Management of ESRD/hemodialysis; anemia, hypertension/volume and secondary hyperparathyroidism  [id number], Quillian Quince, DO  HPI: Justin [name] is a 77 y.o. male with ESRD on HD MWF at [facility].  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   Justin [name] presented to the ED due to rectal bleeding.  Seen and examined at bedside.  Reports bleeding started 2 nights ago with 2 episodes BRBPR.  In the AM he continued to have bleeding with clots so he came to the ED.  Denies previous history of melena and hematochezia.   States he has not seen any additional bleeding since coming to the hospital.  Admits to diarrhea for the last 4-5 days.  Denies fever, chills, abdominal pain, constipation, n/v, weakness, dizziness and fatigue.  Has been tolerating dialysis well with last HD on 09/08/22.  Does not get heparin with dialysis.    Of note chart review shows admission for sepsis 2/2 perirectal abscess s/p surgery in December 2023.    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  Patient has been admitted for further evaluation and management.   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 ([place]) 12/07/2019   Dialysis patient ([place])    Gout    Hypercalcemia 11/14/2021   Hyperkalemia 10/19/2018   Hypertension    Hypotension 06/13/2022   Right posterior capsular opacification 02/27/2021   Septic shock ([place])    Vitreous hemorrhage of right eye ([place]) 10/24/2019   Past Surgical History:  Procedure Laterality Date   A/V FISTULAGRAM Left 08/17/2017   Procedure: A/V FISTULAGRAM;  Surgeon: Katha Cabal, MD;  Location: [place] [facility];  Service: Cardiovascular;  Laterality: Left;   A/V SHUNT INTERVENTION N/A 08/17/2017   Procedure: A/V SHUNT INTERVENTION;  Surgeon: Katha Cabal, MD;  Location: [place] [facility];  Service: Cardiovascular;  Laterality: N/A;   AV FISTULA PLACEMENT Left 05/31/2015   Procedure: ARTERIOVENOUS (AV) FISTULA CREATION;  Surgeon: Katha Cabal, MD;  Location: [facility];  Service: Vascular;  Laterality: Left;   excision bone spurs Bilateral 1989   feet   INCISION AND DRAINAGE PERIRECTAL ABSCESS N/A 06/15/2022   Procedure: IRRIGATION AND DEBRIDEMENT PERIRECTAL ABSCESS;  Surgeon: Aviva Signs, MD;  Location: [facility];  Service: General;  Laterality: N/A;   KNEE SURGERY Left 1998   arthroscopy   SHOULDER SURGERY Left 1994   rotator cuff   Family History  Problem Relation Age of Onset   Kidney disease Mother    Alcoholism Father    Alcoholism Brother    Social History:  reports that he quit smoking about 43 years ago. His smoking use included  cigarettes. He has never used smokeless tobacco. He reports that he does not drink alcohol and does not use drugs. No Known Allergies Prior to Admission medications   Medication Sig Start Date End Date Taking? Authorizing Provider  allopurinol (ZYLOPRIM) 100 MG tablet Take 100 mg by mouth daily. 09/13/21   [provider]  amiodarone (PACERONE) 200 MG tablet Take 1 tablet  (200 mg total) by mouth 2 (two) times daily. On 07/09/22, start amiodarone 200 mg once daily Patient taking differently: Take 200 mg by mouth daily. On 07/09/22, start amiodarone 200 mg once daily 06/26/22   Tat, Shanon Brow, MD  atorvastatin (LIPITOR) 40 MG tablet Take 40 mg by mouth daily.  10/30/14   [provider]  ELIQUIS 5 MG TABS tablet Take 5 mg by mouth 2 (two) times daily. 10/07/19   [provider]  HYDROcodone-acetaminophen (NORCO/VICODIN) 5-325 MG tablet Take 1 tablet by mouth every 6 (six) hours as needed for severe pain. 06/26/22   Orson Eva, MD  LOKELMA 10 g PACK packet Take 10 g by mouth 2 (two) times a week. 06/08/22   [provider]  Methoxy PEG-Epoetin Beta (MIRCERA IJ) Mircera 06/13/21 12/18/22  [provider]  metoprolol tartrate (LOPRESSOR) 25 MG tablet Take 0.5 tablets (12.5 mg total) by mouth 2 (two) times daily. 06/26/22   Orson Eva, MD  omeprazole (PRILOSEC) 20 MG capsule Take 20 mg by mouth every morning.     [provider]  pregabalin (LYRICA) 75 MG capsule Take 1 capsule (75 mg total) by mouth 2 (two) times daily. 06/26/22   Orson Eva, MD  VELPHORO 500 MG chewable tablet Chew 500 mg by mouth 2 (two) times daily. 06/03/22   [provider]   Current Facility-Administered Medications  Medication Dose Route Frequency Provider Last Rate Last Admin   0.9 %  sodium chloride infusion   Intravenous Continuous Noralyn Pick, NP 20 mL/hr at 09/11/22 1118 Continued from Pre-op at 09/11/22 1118   [MAR Hold] acetaminophen (TYLENOL) tablet 650 mg  650 mg Oral Q6H PRN Marcelyn Bruins, MD       Or   Doug Sou Hold] acetaminophen (TYLENOL) suppository 650 mg  650 mg Rectal Q6H PRN Marcelyn Bruins, MD       [MAR Hold] allopurinol (ZYLOPRIM) tablet 100 mg  100 mg Oral Daily Marcelyn Bruins, MD       [MAR Hold] amiodarone (PACERONE) tablet 200 mg  200 mg Oral Daily Marcelyn Bruins, MD       [MAR Hold] atorvastatin  (LIPITOR) tablet 40 mg  40 mg Oral Daily Marcelyn Bruins, MD       [MAR Hold] Chlorhexidine Gluconate Cloth 2 % PADS 6 each  6 each Topical Q0600 Montay Vanvoorhis, [place], [place]       [MAR Hold] metoprolol tartrate (LOPRESSOR) tablet 12.5 mg  12.5 mg Oral BID Marcelyn Bruins, MD   12.5 mg at 09/10/22 2239   [MAR Hold] pantoprazole (PROTONIX) EC tablet 40 mg  40 mg Oral Daily Marcelyn Bruins, MD       [MAR Hold] polyethylene glycol (MIRALAX / GLYCOLAX) packet 17 g  17 g Oral Daily PRN Marcelyn Bruins, MD       [MAR Hold] pregabalin (LYRICA) capsule 75 mg  75 mg Oral BID Marcelyn Bruins, MD   75 mg at 09/10/22 2239   [facility] Hold] sodium chloride flush (NS) 0.9 % injection 3 mL  3 mL Intravenous Q12H Marcelyn Bruins,  MD   3 mL at 09/10/22 2242   [MAR Hold] sucroferric oxyhydroxide (VELPHORO) chewable tablet 500 mg  500 mg Oral BID WC Marcelyn Bruins, MD       Facility-Administered Medications Ordered in Other Encounters  Medication Dose Route Frequency Provider Last Rate Last Admin   ePHEDrine injection   Intravenous Anesthesia Intra-op Lavell Luster, CRNA   10 mg at 09/11/22 1205   lidocaine 2% (20 mg/mL) 5 mL syringe   Intravenous Anesthesia Intra-op Lavell Luster, CRNA   40 mg at 09/11/22 1123   phenylephrine (NEO-SYNEPHRINE) '20mg'$ /NS 253m premix infusion   Intravenous Continuous PRN QLavell Luster CRNA 48.75 mL/hr at 09/11/22 1147 65 mcg/min at 09/11/22 1147   propofol (DIPRIVAN) 10 mg/mL bolus/IV push   Intravenous Anesthesia Intra-op QLavell Luster CRNA   20 mg at 09/11/22 1123   propofol (DIPRIVAN) 500 MG/50ML infusion   Intravenous Continuous PRN QLavell Luster CRNA 21.903 mL/hr at 09/11/22 1151 35 mcg/kg/min at 09/11/22 1151   Labs: Basic Metabolic Panel: Recent Labs  Lab 09/10/22 0820 09/10/22 0849 09/11/22 0616  NA 141 141 142  K 3.8 3.3* 3.6  CL 98 98 98  CO2 28  --  28  GLUCOSE 118* 110* 103*  BUN 20 21 27*  CREATININE 6.10* 7.10* 7.92*  CALCIUM 8.1*  --   8.4*   Liver Function Tests: Recent Labs  Lab 09/10/22 0820 09/11/22 0616  AST 34 16  ALT 13 11  ALKPHOS 89 87  BILITOT 0.6 0.6  PROT 7.1 7.1  ALBUMIN 2.9* 2.9*   CBC: Recent Labs  Lab 09/10/22 0820 09/10/22 0849 09/10/22 1215 09/11/22 0616  WBC 7.2  --   --  6.9  NEUTROABS 4.1  --   --   --   HGB 8.2* 9.9* 7.8* 8.4*  HCT 27.3* 29.0* 26.9* 28.4*  MCV 105.0*  --   --  105.6*  PLT 193  --   --  195   Iron Studies:  Recent Labs    09/11/22 0616  IRON 54  TIBC 200*  FERRITIN 1,126*   Studies/Results: DG Chest Portable 1 View  Result Date: 09/10/2022 CLINICAL DATA:  Provided history: Hypoxia. EXAM: PORTABLE CHEST 1 VIEW COMPARISON:  Prior chest radiographs 06/16/2022 and earlier. FINDINGS: Shallow inspiration radiograph, accentuating the cardiac silhouette and limiting evaluation of heart size. Aortic atherosclerosis. Bilateral perihilar and left lung base opacities, which may reflect atelectasis, edema and/or atypical/viral infection. No evidence of pleural effusion or pneumothorax. No acute osseous abnormality identified. Chronic widening of the left acromioclavicular joint. A vascular stent projects in the left subclavian/axillary region. IMPRESSION: Shallow inspiration radiograph. Bilateral perihilar and left lung base opacities which may reflect atelectasis, edema and/or atypical/viral infection. Electronically Signed   By: KKellie SimmeringD.O.   On: 09/10/2022 12:17   CT ANGIO GI BLEED  Result Date: 09/10/2022 CLINICAL DATA:  Rectal bleeding EXAM: CTA ABDOMEN AND PELVIS WITHOUT AND WITH CONTRAST TECHNIQUE: Multidetector CT imaging of the abdomen and pelvis was performed using the standard protocol during bolus administration of intravenous contrast. Multiplanar reconstructed images and MIPs were obtained and reviewed to evaluate the vascular anatomy. RADIATION DOSE REDUCTION: This exam was performed according to the departmental dose-optimization program which includes  automated exposure control, adjustment of the mA and/or kV according to patient size and/or use of iterative reconstruction technique. CONTRAST:  75 mL OMNIPAQUE IOHEXOL 350 MG/ML SOLN COMPARISON:  08/19/2022 FINDINGS: VASCULAR Aorta: Dense atheromatous calcifications. No aneurysm or dissection.  Celiac: Atheromatous changes no evidence of occlusion. SMA: Atheromatous changes no evidence of occlusion. Renals: Atheromatous changes no evidence of occlusion. IMA: Atheromatous changes no evidence of occlusion. Inflow: Patent without evidence of aneurysm, dissection, vasculitis or significant stenosis. Proximal Outflow: Bilateral common femoral and visualized portions of the superficial and profunda femoral arteries are patent without evidence of aneurysm, dissection, vasculitis or significant stenosis. Veins: No obvious venous abnormality within the limitations of this arterial phase study. Review of the MIP images confirms the above findings. NON-VASCULAR Lower chest: Dependent basilar subsegmental atelectasis. No pleural or pericardial effusion identified. There is bilateral gynecomastia. Hepatobiliary: Distended gallbladder with layering milk calcium or calcified stones. No hepatic parenchymal abnormalities or biliary ductal dilatation identified. Pancreas: Unremarkable. No pancreatic ductal dilatation or surrounding inflammatory changes. Spleen: Normal in size without focal abnormality. Adrenals/Urinary Tract: No adrenal lesions. Kidneys are atrophic. There are numerous centimeter-sized cystic lesions in both kidneys that do not need further imaging follow up. No hydronephrosis or nephrolithiasis identified. Urinary bladder empty. Stomach/Bowel: Stomach is within normal limits. Appendix appears normal. No evidence of bowel wall thickening, distention, or inflammatory changes. Delayed postcontrast images demonstrate no evidence of extravasation of contrast indicate the presence of active GI hemorrhage. Lymphatic: No  suspicious adenopathy identified. Reproductive: Prostate brachytherapy seeds noted. Prostate appears mildly prominent. Other: Small abdominal wall periumbilical defect containing omental fat without evidence of incarceration Musculoskeletal: Diffuse osteoblastic changes with the differential including diffuse metastatic disease versus renal osteodystrophy. IMPRESSION: VASCULAR 1. Dense atheromatous changes noted for the aorta and its branches without evidence of significant occlusive changes, aneurysm or dissection. NON-VASCULAR 1. No evidence of extravasation of IV contrast to indicate the presence of active GI bleed. 2. Sequelae of end-stage renal disease. 3. Distended gallbladder with evidence of stones or sludge. 4. 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 5. Bilateral gynecomastia. Electronically Signed   By: Sammie Bench M.D.   On: 09/10/2022 11:20    ROS: All others negative except those listed in HPI.  Physical Exam: Vitals:   09/10/22 1939 09/11/22 0500 09/11/22 0821 09/11/22 1000  BP: 124/85 125/65 119/60 138/75  Pulse: 74 61 64 67  Resp: '17 17 17 18  '$ Temp: 98.1 F (36.7 C) 97.6 F (36.4 C) 97.6 F (36.4 C) 97.7 F (36.5 C)  TempSrc:  Oral Oral Temporal  SpO2: 94% 96% 96% 94%  Weight:    104.3 kg  Height:    '5\' 10"'$  (1.778 m)     General: WDWN male in NAD Head: NCAT sclera not icteric MMM Neck: Supple. No lymphadenopathy Lungs: CTA bilaterally. No wheeze, rales or rhonchi. Breathing is unlabored. Heart: RRR. No murmur, rubs or gallops.  Abdomen: soft, nontender, +BS, no guarding, no rebound tenderness M/S:  Equal strength b/l in upper and lower extremities.  Lower extremities:no edema, ischemic changes, or open wounds  Neuro: AAOx3. Moves all extremities spontaneously. Psych:  Responds to questions appropriately with a normal affect. Dialysis Access: LU AVF +b/t  Dialysis Orders:  MWF - [facility]  3.75hrs, BFR 450, DFR 600,   EDW 103.7kg, 2K/ 2.5Ca  Access: LU AVF  Heparin none Mircera 100 mcg q2wks - last 2/28 Venofer '100mg'$  qHD x5 Calcitriol 0.52mg PO qHD  Assessment/Plan:  Hematochezia - Hgb stable.  No active bleeding on CTA abd.  Plan for EGD and colonoscopy today.   ESRD -  on HD MWF.  Plan for HD today per regular schedule.  Hypertension/volume  - Blood pressure well controlled.  CXR with  possible pulmonary edema, max UF as tolerated.   Anemia of CKD - Hgb 8.4 today.  ESA just given.  Tsat close to goal.  Ferritin 1126. Hold IV iron while here, continue as outpatient.   Secondary Hyperparathyroidism -  CCa at goal. Check phos.  Continue VDRA and binders - Velphoro  Nutrition - Renal diet w/fluid restrictions when eating.    Jen Mow, PA-C [facility] 09/11/2022, 12:05 PM

## 2022-09-12 DIAGNOSIS — Z87891 Personal history of nicotine dependence: Secondary | ICD-10-CM | POA: Diagnosis not present

## 2022-09-12 DIAGNOSIS — E113511 Type 2 diabetes mellitus with proliferative diabetic retinopathy with macular edema, right eye: Secondary | ICD-10-CM | POA: Diagnosis present

## 2022-09-12 DIAGNOSIS — D631 Anemia in chronic kidney disease: Secondary | ICD-10-CM | POA: Diagnosis present

## 2022-09-12 DIAGNOSIS — I120 Hypertensive chronic kidney disease with stage 5 chronic kidney disease or end stage renal disease: Secondary | ICD-10-CM | POA: Diagnosis present

## 2022-09-12 DIAGNOSIS — D6832 Hemorrhagic disorder due to extrinsic circulating anticoagulants: Secondary | ICD-10-CM | POA: Diagnosis present

## 2022-09-12 DIAGNOSIS — K627 Radiation proctitis: Secondary | ICD-10-CM | POA: Diagnosis present

## 2022-09-12 DIAGNOSIS — K922 Gastrointestinal hemorrhage, unspecified: Secondary | ICD-10-CM | POA: Diagnosis present

## 2022-09-12 DIAGNOSIS — E1140 Type 2 diabetes mellitus with diabetic neuropathy, unspecified: Secondary | ICD-10-CM | POA: Diagnosis present

## 2022-09-12 DIAGNOSIS — D62 Acute posthemorrhagic anemia: Secondary | ICD-10-CM | POA: Diagnosis present

## 2022-09-12 DIAGNOSIS — E1122 Type 2 diabetes mellitus with diabetic chronic kidney disease: Secondary | ICD-10-CM | POA: Diagnosis present

## 2022-09-12 DIAGNOSIS — M109 Gout, unspecified: Secondary | ICD-10-CM | POA: Diagnosis present

## 2022-09-12 DIAGNOSIS — I480 Paroxysmal atrial fibrillation: Secondary | ICD-10-CM | POA: Diagnosis present

## 2022-09-12 DIAGNOSIS — I4892 Unspecified atrial flutter: Secondary | ICD-10-CM | POA: Diagnosis present

## 2022-09-12 DIAGNOSIS — K921 Melena: Secondary | ICD-10-CM | POA: Diagnosis present

## 2022-09-12 DIAGNOSIS — I959 Hypotension, unspecified: Secondary | ICD-10-CM | POA: Diagnosis not present

## 2022-09-12 DIAGNOSIS — D123 Benign neoplasm of transverse colon: Secondary | ICD-10-CM | POA: Diagnosis present

## 2022-09-12 DIAGNOSIS — K611 Rectal abscess: Secondary | ICD-10-CM | POA: Diagnosis present

## 2022-09-12 DIAGNOSIS — Z923 Personal history of irradiation: Secondary | ICD-10-CM | POA: Diagnosis not present

## 2022-09-12 DIAGNOSIS — Z992 Dependence on renal dialysis: Secondary | ICD-10-CM | POA: Diagnosis not present

## 2022-09-12 DIAGNOSIS — K5900 Constipation, unspecified: Secondary | ICD-10-CM | POA: Diagnosis present

## 2022-09-12 DIAGNOSIS — I472 Ventricular tachycardia, unspecified: Secondary | ICD-10-CM | POA: Diagnosis present

## 2022-09-12 DIAGNOSIS — E782 Mixed hyperlipidemia: Secondary | ICD-10-CM | POA: Diagnosis present

## 2022-09-12 DIAGNOSIS — N186 End stage renal disease: Secondary | ICD-10-CM | POA: Diagnosis present

## 2022-09-12 DIAGNOSIS — Y842 Radiological procedure and radiotherapy as the cause of abnormal reaction of the patient, or of later complication, without mention of misadventure at the time of the procedure: Secondary | ICD-10-CM | POA: Diagnosis present

## 2022-09-12 DIAGNOSIS — E669 Obesity, unspecified: Secondary | ICD-10-CM | POA: Diagnosis present

## 2022-09-12 DIAGNOSIS — N2581 Secondary hyperparathyroidism of renal origin: Secondary | ICD-10-CM | POA: Diagnosis present

## 2022-09-12 LAB — CBC
HCT: 23.8 % — ABNORMAL LOW (ref 39.0–52.0)
Hemoglobin: 7.3 g/dL — ABNORMAL LOW (ref 13.0–17.0)
MCH: 31.9 pg (ref 26.0–34.0)
MCHC: 30.7 g/dL (ref 30.0–36.0)
MCV: 103.9 fL — ABNORMAL HIGH (ref 80.0–100.0)
Platelets: 152 10*3/uL (ref 150–400)
RBC: 2.29 MIL/uL — ABNORMAL LOW (ref 4.22–5.81)
RDW: 16.9 % — ABNORMAL HIGH (ref 11.5–15.5)
WBC: 7.5 10*3/uL (ref 4.0–10.5)
nRBC: 0 % (ref 0.0–0.2)

## 2022-09-12 LAB — COMPREHENSIVE METABOLIC PANEL
ALT: 39 U/L (ref 0–44)
AST: 73 U/L — ABNORMAL HIGH (ref 15–41)
Albumin: 3.4 g/dL — ABNORMAL LOW (ref 3.5–5.0)
Alkaline Phosphatase: 120 U/L (ref 38–126)
Anion gap: 12 (ref 5–15)
BUN: 11 mg/dL (ref 8–23)
CO2: 29 mmol/L (ref 22–32)
Calcium: 8.2 mg/dL — ABNORMAL LOW (ref 8.9–10.3)
Chloride: 97 mmol/L — ABNORMAL LOW (ref 98–111)
Creatinine, Ser: 5.03 mg/dL — ABNORMAL HIGH (ref 0.61–1.24)
GFR, Estimated: 11 mL/min — ABNORMAL LOW (ref >60)
Glucose, Bld: 112 mg/dL — ABNORMAL HIGH (ref 70–99)
Potassium: 3.2 mmol/L — ABNORMAL LOW (ref 3.5–5.1)
Sodium: 138 mmol/L (ref 135–145)
Total Bilirubin: 1.6 mg/dL — ABNORMAL HIGH (ref 0.3–1.2)
Total Protein: 6.8 g/dL (ref 6.5–8.1)

## 2022-09-12 LAB — HEPATITIS B SURFACE ANTIBODY, QUANTITATIVE: Hep B S AB Quant (Post): 263.4 m[IU]/mL (ref >9.9)

## 2022-09-12 LAB — PHOSPHORUS: Phosphorus: 2.8 mg/dL (ref 2.5–4.6)

## 2022-09-12 LAB — ERYTHROPOIETIN: Erythropoietin: 263 m[IU]/mL — ABNORMAL HIGH (ref 2.6–18.5)

## 2022-09-12 LAB — PREPARE RBC (CROSSMATCH)

## 2022-09-12 MED ORDER — MIDODRINE HCL 5 MG PO TABS
10.0000 mg | ORAL_TABLET | Freq: Once | ORAL | Status: AC
  Administered 2022-09-12: 10 mg via ORAL
  Filled 2022-09-12: qty 2

## 2022-09-12 MED ORDER — PREGABALIN 75 MG PO CAPS
75.0000 mg | ORAL_CAPSULE | Freq: Two times a day (BID) | ORAL | Status: DC
  Administered 2022-09-12: 75 mg via ORAL
  Filled 2022-09-12: qty 1

## 2022-09-12 MED ORDER — POTASSIUM CHLORIDE CRYS ER 20 MEQ PO TBCR
30.0000 meq | EXTENDED_RELEASE_TABLET | Freq: Once | ORAL | Status: AC
  Administered 2022-09-12: 30 meq via ORAL
  Filled 2022-09-12: qty 1

## 2022-09-12 MED ORDER — SODIUM CHLORIDE 0.9% IV SOLUTION: Freq: Once | INTRAVENOUS | Status: AC

## 2022-09-12 NOTE — Discharge Summary (Signed)
Physician Discharge Summary  Justin [name] [id number] DOB: 10-07-1945 DOA: 09/10/2022  PCP: Michell Heinrich, DO  Admit date: 09/10/2022 Discharge date: 09/12/2022  Admitted From: home Disposition:  home  Recommendations for Outpatient Follow-up:  Follow up with PCP in 1-2 weeks Follow-up with Dr. Lorenso Courier with GI in 2 to 3 weeks Please obtain BMP/CBC in one week Can resume Eliquis tomorrow 09/13/2022  Home Health: none Equipment/Devices: none  Discharge Condition: stable CODE STATUS: Full code Diet Orders (From admission, onward)     Start     Ordered   09/11/22 1333  Diet renal with fluid restriction Fluid restriction: 1200 mL Fluid; Room service appropriate? Yes; Fluid consistency: Thin  Diet effective now       Question Answer Comment  Fluid restriction: 1200 mL Fluid   Room service appropriate? Yes   Fluid consistency: Thin      09/11/22 1332            HPI: Per admitting MD, [name] is a 77 y.o. male with medical history significant of Diabetes, atrial fibrillation, hypertension, ESRD on HD, anemia, tachycardia, first-degree AV block, obesity, gout, GERD, back pain, prostate cancer in 2009 presenting with rectal bleeding. Patient reports rectal bleeding starting overnight, initially started between 1 and 6 AM when he got up during the nigh.  Initially had 2 episodes of bright red blood per rectum.  Has continued bleeding this morning with clots.  No history of prior GI bleeding.  Did have recent surgical intervention of perirectal abscess in December.  He is on Eliquis due to his history of paroxysmal A-fib.  Patient reports his last dialysis session was yesterday which she completed. Has had some history of intermittent loose stools ongoing since he got home from rehab facility in January.  Does have some alternating constipation as well. Reporting some loose stool with cough at times. Denies fevers, chills, chest pain, shortness of breath, abdominal pain,  constipation, diarrhea, nausea, vomiting.  Hospital Course / Discharge diagnoses: Principal Problem:   Acute GI bleeding Active Problems:   Essential hypertension   DM (diabetes mellitus) (HCC)   Anemia in chronic kidney disease   ESRD on dialysis (HCC)   Paroxysmal A-fib (HCC)   GERD (gastroesophageal reflux disease)   Gouty arthropathy   Mixed hyperlipidemia   Obesity (BMI 30-39.9)   Lower GI bleeding   Principal problem Lower GI bleed - with bright red blood per rectum, gastroenterology consulted.  He underwent an EGD on 3/1 which showed a small stomach nodule as well as 2 duodenal polyps.  These were not removed in the setting of acute GI bleed.  GI recommends repeat EGD/EUS as an outpatient for further evaluation.  He also had a colonoscopy which showed a 15 mm polyp in the cecum, sessile, not removed due to concern for GI bleed, as well as a 10 mm polyp in the transverse colon with stigmata of recent bleeding.  This was removed with a hot snare.  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  The most likely source for his recent bleed, now status post APC.  Clinically his bleeding has resolved, and GI recommends resumption of anticoagulation next day.  Patient's hemoglobin dipped as low as 7.3, he received a unit of packed red blood cells prior to discharge, however he refused repeat H&H.  I recommend that he follows up with his dialysis unit or PCP ASAP to get a repeat H&H, however it is expected to  improve given cessation of his bleeding   Active problems Acute blood loss anemia, anemia of chronic kidney disease -patient has not had any further bleeding  ESRD -on HD MWF, nephrology consulted, he will have dialysis later today Essential hypertension -continue home metoprolol Paroxysmal A-fib/flutter, history of wide-complex VT -continue amiodarone.  Currently Eliquis is on hold for another day following discharge as per GI Gout -continue  allopurinol Obesity -BMI 33, he would benefit from weight loss History of prostate cancer -noted DM2 -controlled  Sepsis ruled out   Discharge Instructions   Allergies as of 09/12/2022   No Known Allergies      Medication List     TAKE these medications    allopurinol 100 MG tablet Commonly known as: ZYLOPRIM Take 100 mg by mouth daily.   amiodarone 200 MG tablet Commonly known as: PACERONE Take 1 tablet (200 mg total) by mouth 2 (two) times daily. On 07/09/22, start amiodarone 200 mg once daily What changed: when to take this   atorvastatin 40 MG tablet Commonly known as: LIPITOR Take 40 mg by mouth daily.   cinacalcet 30 MG tablet Commonly known as: SENSIPAR Take 30 mg by mouth daily.   Eliquis 5 MG Tabs tablet Generic drug: apixaban Take 5 mg by mouth 2 (two) times daily.   HYDROcodone-acetaminophen 5-325 MG tablet Commonly known as: NORCO/VICODIN Take 1 tablet by mouth every 6 (six) hours as needed for severe pain. What changed: when to take this   Lokelma 10 g Pack packet Generic drug: sodium zirconium cyclosilicate Take 10 g by mouth 2 (two) times a week.   metoprolol tartrate 25 MG tablet Commonly known as: LOPRESSOR Take 0.5 tablets (12.5 mg total) by mouth 2 (two) times daily. What changed:  how much to take when to take this   midodrine 5 MG tablet Commonly known as: PROAMATINE Take 10 mg by mouth 3 (three) times a week. Prior to dialysis M-W-F   MIRCERA IJ Mircera   omeprazole 20 MG capsule Commonly known as: PRILOSEC Take 20 mg by mouth every morning.   pregabalin 75 MG capsule Commonly known as: LYRICA Take 1 capsule (75 mg total) by mouth 2 (two) times daily. What changed: when to take this   Velphoro 500 MG chewable tablet Generic drug: sucroferric oxyhydroxide Chew 500 mg by mouth 2 (two) times daily.       Consultations: GI  Procedures/Studies:  DG Chest Portable 1 View  Result Date: 09/10/2022 CLINICAL DATA:   Provided history: Hypoxia. EXAM: PORTABLE CHEST 1 VIEW COMPARISON:  Prior chest radiographs 06/16/2022 and earlier. FINDINGS: Shallow inspiration radiograph, accentuating the cardiac silhouette and limiting evaluation of heart size. Aortic atherosclerosis. Bilateral perihilar and left lung base opacities, which may reflect atelectasis, edema and/or atypical/viral infection. No evidence of pleural effusion or pneumothorax. No acute osseous abnormality identified. Chronic widening of the left acromioclavicular joint. A vascular stent projects in the left subclavian/axillary region. IMPRESSION: Shallow inspiration radiograph. Bilateral perihilar and left lung base opacities which may reflect atelectasis, edema and/or atypical/viral infection. Electronically Signed   By: Kellie Simmering D.O.   On: 09/10/2022 12:17   CT ANGIO GI BLEED  Result Date: 09/10/2022 CLINICAL DATA:  Rectal bleeding EXAM: CTA ABDOMEN AND PELVIS WITHOUT AND WITH CONTRAST TECHNIQUE: Multidetector CT imaging of the abdomen and pelvis was performed using the standard protocol during bolus administration of intravenous contrast. Multiplanar reconstructed images and MIPs were obtained and reviewed to evaluate the vascular anatomy. RADIATION DOSE REDUCTION: This exam was  performed according to the departmental dose-optimization program which includes automated exposure control, adjustment of the mA and/or kV according to patient size and/or use of iterative reconstruction technique. CONTRAST:  75 mL OMNIPAQUE IOHEXOL 350 MG/ML SOLN COMPARISON:  08/19/2022 FINDINGS: VASCULAR Aorta: Dense atheromatous calcifications. No aneurysm or dissection. Celiac: Atheromatous changes no evidence of occlusion. SMA: Atheromatous changes no evidence of occlusion. Renals: Atheromatous changes no evidence of occlusion. IMA: Atheromatous changes no evidence of occlusion. Inflow: Patent without evidence of aneurysm, dissection, vasculitis or significant stenosis. Proximal  Outflow: Bilateral common femoral and visualized portions of the superficial and profunda femoral arteries are patent without evidence of aneurysm, dissection, vasculitis or significant stenosis. Veins: No obvious venous abnormality within the limitations of this arterial phase study. Review of the MIP images confirms the above findings. NON-VASCULAR Lower chest: Dependent basilar subsegmental atelectasis. No pleural or pericardial effusion identified. There is bilateral gynecomastia. Hepatobiliary: Distended gallbladder with layering milk calcium or calcified stones. No hepatic parenchymal abnormalities or biliary ductal dilatation identified. Pancreas: Unremarkable. No pancreatic ductal dilatation or surrounding inflammatory changes. Spleen: Normal in size without focal abnormality. Adrenals/Urinary Tract: No adrenal lesions. Kidneys are atrophic. There are numerous centimeter-sized cystic lesions in both kidneys that do not need further imaging follow up. No hydronephrosis or nephrolithiasis identified. Urinary bladder empty. Stomach/Bowel: Stomach is within normal limits. Appendix appears normal. No evidence of bowel wall thickening, distention, or inflammatory changes. Delayed postcontrast images demonstrate no evidence of extravasation of contrast indicate the presence of active GI hemorrhage. Lymphatic: No suspicious adenopathy identified. Reproductive: Prostate brachytherapy seeds noted. Prostate appears mildly prominent. Other: Small abdominal wall periumbilical defect containing omental fat without evidence of incarceration Musculoskeletal: Diffuse osteoblastic changes with the differential including diffuse metastatic disease versus renal osteodystrophy. IMPRESSION: VASCULAR 1. Dense atheromatous changes noted for the aorta and its branches without evidence of significant occlusive changes, aneurysm or dissection. NON-VASCULAR 1. No evidence of extravasation of IV contrast to indicate the presence of  active GI bleed. 2. Sequelae of end-stage renal disease. 3. Distended gallbladder with evidence of stones or sludge. 4. 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 5. Bilateral gynecomastia. Electronically Signed   By: Sammie Bench M.D.   On: 09/10/2022 11:20   CT Abdomen Pelvis W Contrast  Result Date: 08/19/2022 CLINICAL DATA:  Epigastric pain EXAM: CT ABDOMEN AND PELVIS WITH CONTRAST TECHNIQUE: Multidetector CT imaging of the abdomen and pelvis was performed using the standard protocol following bolus administration of intravenous contrast. RADIATION DOSE REDUCTION: This exam was performed according to the departmental dose-optimization program which includes automated exposure control, adjustment of the mA and/or kV according to patient size and/or use of iterative reconstruction technique. CONTRAST:  9m OMNIPAQUE IOHEXOL 350 MG/ML SOLN COMPARISON:  06/12/2022 FINDINGS: Lower chest: Linear areas of atelectasis in the lung bases. Coronary artery and aortic calcifications. Hepatobiliary: Small layering gallstones within the gallbladder. No focal hepatic abnormality. Pancreas: No focal abnormality or ductal dilatation. Spleen: No focal abnormality.  Normal size. Adrenals/Urinary Tract: Numerous small cysts in the kidneys bilaterally, unchanged since prior study. Kidneys are mildly atrophic. No hydronephrosis. Adrenal glands and urinary bladder unremarkable. Stomach/Bowel: Normal appendix. Moderate stool burden throughout the colon. Stomach, large and small bowel grossly unremarkable. Vascular/Lymphatic: Aortic atherosclerosis. No evidence of aneurysm or adenopathy. Reproductive: Prostate enlargement. Other: No free fluid or free air. Small umbilical hernia containing fat. Musculoskeletal: No acute bony abnormality. IMPRESSION: Cholelithiasis.  No CT evidence of acute cholecystitis. Bibasilar atelectasis. Moderate stool burden throughout the  colon. Coronary  artery disease, aortic atherosclerosis. Prostate enlargement. Electronically Signed   By: Rolm Baptise M.D.   On: 08/19/2022 02:24     Subjective: - no chest pain, shortness of breath, no abdominal pain, nausea or vomiting.   Discharge Exam: BP 129/68   Pulse 78   Temp 98 F (36.7 C) (Oral)   Resp 18   Ht '5\' 10"'$  (1.778 m)   Wt 100.5 kg   SpO2 98%   BMI 31.79 kg/m   General: Pt is alert, awake, not in acute distress Cardiovascular: RRR, S1/S2 +, no rubs, no gallops Respiratory: CTA bilaterally, no wheezing, no rhonchi Abdominal: Soft, NT, ND, bowel sounds + Extremities: no edema, no cyanosis    The results of significant diagnostics from this hospitalization (including imaging, microbiology, ancillary and laboratory) are listed below for reference.     Microbiology: No results found for this or any previous visit (from the past 240 hour(s)).   Labs: Basic Metabolic Panel: Recent Labs  Lab 09/10/22 0820 09/10/22 0849 09/11/22 0616 09/12/22 0444  NA 141 141 142 138  K 3.8 3.3* 3.6 3.2*  CL 98 98 98 97*  CO2 28  --  28 29  GLUCOSE 118* 110* 103* 112*  BUN 20 21 27* 11  CREATININE 6.10* 7.10* 7.92* 5.03*  CALCIUM 8.1*  --  8.4* 8.2*  PHOS  --   --   --  2.8   Liver Function Tests: Recent Labs  Lab 09/10/22 0820 09/11/22 0616 09/12/22 0444  AST 34 16 73*  ALT 13 11 39  ALKPHOS 89 87 120  BILITOT 0.6 0.6 1.6*  PROT 7.1 7.1 6.8  ALBUMIN 2.9* 2.9* 3.4*   CBC: Recent Labs  Lab 09/10/22 0820 09/10/22 0849 09/10/22 1215 09/11/22 0616 09/12/22 0444  WBC 7.2  --   --  6.9 7.5  NEUTROABS 4.1  --   --   --   --   HGB 8.2* 9.9* 7.8* 8.4* 7.3*  HCT 27.3* 29.0* 26.9* 28.4* 23.8*  MCV 105.0*  --   --  105.6* 103.9*  PLT 193  --   --  195 152   CBG: Recent Labs  Lab 09/11/22 1238 09/11/22 2351  GLUCAP 94 122*   Hgb A1c No results for input(s): "HGBA1C" in the last 72 hours. Lipid Profile No results for input(s): "CHOL", "HDL", "LDLCALC", "TRIG",  "CHOLHDL", "LDLDIRECT" in the last 72 hours. Thyroid function studies No results for input(s): "TSH", "T4TOTAL", "T3FREE", "THYROIDAB" in the last 72 hours.  Invalid input(s): "FREET3" Urinalysis    Component Value Date/Time   COLORURINE AMBER (A) 05/27/2020 1451   APPEARANCEUR CLOUDY (A) 05/27/2020 1451   LABSPEC 1.017 05/27/2020 1451   PHURINE 6.0 05/27/2020 1451   GLUCOSEU 50 (A) 05/27/2020 1451   HGBUR LARGE (A) 05/27/2020 1451   BILIRUBINUR NEGATIVE 05/27/2020 1451   KETONESUR NEGATIVE 05/27/2020 1451   PROTEINUR 100 (A) 05/27/2020 1451   UROBILINOGEN 0.2 08/02/2010 1534   NITRITE NEGATIVE 05/27/2020 1451   LEUKOCYTESUR SMALL (A) 05/27/2020 1451    FURTHER DISCHARGE INSTRUCTIONS:   Get Medicines reviewed and adjusted: Please take all your medications with you for your next visit with your Primary MD   Laboratory/radiological data: Please request your Primary MD to go over all hospital tests and procedure/radiological results at the follow up, please ask your Primary MD to get all Hospital records sent to his/her office.   In some cases, they will be blood work, cultures and biopsy results pending  at the time of your discharge. Please request that your primary care M.D. goes through all the records of your hospital data and follows up on these results.   Also Note the following: If you experience worsening of your admission symptoms, develop shortness of breath, life threatening emergency, suicidal or homicidal thoughts you must seek medical attention immediately by calling 911 or calling your MD immediately  if symptoms less severe.   You must read complete instructions/literature along with all the possible adverse reactions/side effects for all the Medicines you take and that have been prescribed to you. Take any new Medicines after you have completely understood and accpet all the possible adverse reactions/side effects.    Do not drive when taking Pain medications or  sleeping medications (Benzodaizepines)   Do not take more than prescribed Pain, Sleep and Anxiety Medications. It is not advisable to combine anxiety,sleep and pain medications without talking with your primary care practitioner   Special Instructions: If you have smoked or chewed Tobacco  in the last 2 yrs please stop smoking, stop any regular Alcohol  and or any Recreational drug use.   Wear Seat belts while driving.   Please note: You were cared for by a hospitalist during your hospital stay. Once you are discharged, your primary care physician will handle any further medical issues. Please note that NO REFILLS for any discharge medications will be authorized once you are discharged, as it is imperative that you return to your primary care physician (or establish a relationship with a primary care physician if you do not have one) for your post hospital discharge needs so that they can reassess your need for medications and monitor your lab values.  Time coordinating discharge: 35 minutes  SIGNED:  Marzetta Board, MD, PhD 09/12/2022, 2:07 PM

## 2022-09-12 NOTE — Progress Notes (Signed)
[facility] Progress Note   Subjective:   Patient seen and examined at bedside.  BP dropped during HD yesterday - 2L removed.  Given albumin and midodrine.  Per patient he usually takes a medication pre HD to prevent BP drops.  Feeling ok.  Denies CP, SOB, edema, weakness and fatigue.  Abdominal pain improved. No BM since admission.  Waiting to get blood transfusion - Hgb 7.3 this AM.  Biggest complaint is burning in his feet, reports he usually takes Lyrica '75mg'$  BID and has not had it yet this admission.   Objective Vitals:   09/11/22 2300 09/12/22 0008 09/12/22 0420 09/12/22 0756  BP: (!) 108/45 (!) 112/58 (!) 93/48 (!) 98/43  Pulse: 73 73 70 76  Resp: '18 17 18 20  '$ Temp:  97.9 F (36.6 C) 98.4 F (36.9 C) 98.8 F (37.1 C)  TempSrc:  Oral Oral Oral  SpO2: 96% 95% 96% 97%  Weight:      Height:       Physical Exam General:well appearing male in NAD Heart:RRR, no mrg Lungs:+crackles in bases, nml WOB on RA Abdomen:soft, NTND Extremities:no LE edema Dialysis Access: LU AVF +b/t   Filed Weights   09/11/22 1000 09/11/22 2102  Weight: 104.3 kg 100.5 kg    Intake/Output Summary (Last 24 hours) at 09/12/2022 0801 Last data filed at 09/11/2022 2300 Gross per 24 hour  Intake 700 ml  Output 2000 ml  Net -1300 ml    Additional Objective Labs: Basic Metabolic Panel: Recent Labs  Lab 09/10/22 0820 09/10/22 0849 09/11/22 0616 09/12/22 0444  NA 141 141 142 138  K 3.8 3.3* 3.6 3.2*  CL 98 98 98 97*  CO2 28  --  28 29  GLUCOSE 118* 110* 103* 112*  BUN 20 21 27* 11  CREATININE 6.10* 7.10* 7.92* 5.03*  CALCIUM 8.1*  --  8.4* 8.2*   Liver Function Tests: Recent Labs  Lab 09/10/22 0820 09/11/22 0616 09/12/22 0444  AST 34 16 73*  ALT 13 11 39  ALKPHOS 89 87 120  BILITOT 0.6 0.6 1.6*  PROT 7.1 7.1 6.8  ALBUMIN 2.9* 2.9* 3.4*   CBC: Recent Labs  Lab 09/10/22 0820 09/10/22 0849 09/10/22 1215 09/11/22 0616 09/12/22 0444  WBC 7.2  --   --  6.9 7.5   NEUTROABS 4.1  --   --   --   --   HGB 8.2*   < > 7.8* 8.4* 7.3*  HCT 27.3*   < > 26.9* 28.4* 23.8*  MCV 105.0*  --   --  105.6* 103.9*  PLT 193  --   --  195 152   < > = values in this interval not displayed.   Blood Culture    Component Value Date/Time   SDES ABSCESS 06/15/2022 1344   SPECREQUEST PERIRECTAL 06/15/2022 1344   CULT  06/15/2022 1344    FEW ESCHERICHIA COLI NO ANAEROBES ISOLATED Performed at [facility], [place] [address] [place], [place] [address]    REPTSTATUS 06/20/2022 FINAL 06/15/2022 1344   CBG: Recent Labs  Lab 09/11/22 1238 09/11/22 2351  GLUCAP 94 122*   Iron Studies:  Recent Labs    09/11/22 0616  IRON 54  TIBC 200*  FERRITIN 1,126*   Lab Results  Component Value Date   INR 1.3 (H) 09/10/2022   INR 1.1 05/27/2020   INR 1.3 (H) 01/28/2020   Studies/Results: DG Chest Portable 1 View  Result Date: 09/10/2022 CLINICAL DATA:  Provided  history: Hypoxia. EXAM: PORTABLE CHEST 1 VIEW COMPARISON:  Prior chest radiographs 06/16/2022 and earlier. FINDINGS: Shallow inspiration radiograph, accentuating the cardiac silhouette and limiting evaluation of heart size. Aortic atherosclerosis. Bilateral perihilar and left lung base opacities, which may reflect atelectasis, edema and/or atypical/viral infection. No evidence of pleural effusion or pneumothorax. No acute osseous abnormality identified. Chronic widening of the left acromioclavicular joint. A vascular stent projects in the left subclavian/axillary region. IMPRESSION: Shallow inspiration radiograph. Bilateral perihilar and left lung base opacities which may reflect atelectasis, edema and/or atypical/viral infection. Electronically Signed   By: Kellie Simmering D.O.   On: 09/10/2022 12:17   CT ANGIO GI BLEED  Result Date: 09/10/2022 CLINICAL DATA:  Rectal bleeding EXAM: CTA ABDOMEN AND PELVIS WITHOUT AND WITH CONTRAST TECHNIQUE: Multidetector CT imaging of the abdomen and pelvis was performed using the  standard protocol during bolus administration of intravenous contrast. Multiplanar reconstructed images and MIPs were obtained and reviewed to evaluate the vascular anatomy. RADIATION DOSE REDUCTION: This exam was performed according to the departmental dose-optimization program which includes automated exposure control, adjustment of the mA and/or kV according to patient size and/or use of iterative reconstruction technique. CONTRAST:  75 mL OMNIPAQUE IOHEXOL 350 MG/ML SOLN COMPARISON:  08/19/2022 FINDINGS: VASCULAR Aorta: Dense atheromatous calcifications. No aneurysm or dissection. Celiac: Atheromatous changes no evidence of occlusion. SMA: Atheromatous changes no evidence of occlusion. Renals: Atheromatous changes no evidence of occlusion. IMA: Atheromatous changes no evidence of occlusion. Inflow: Patent without evidence of aneurysm, dissection, vasculitis or significant stenosis. Proximal Outflow: Bilateral common femoral and visualized portions of the superficial and profunda femoral arteries are patent without evidence of aneurysm, dissection, vasculitis or significant stenosis. Veins: No obvious venous abnormality within the limitations of this arterial phase study. Review of the MIP images confirms the above findings. NON-VASCULAR Lower chest: Dependent basilar subsegmental atelectasis. No pleural or pericardial effusion identified. There is bilateral gynecomastia. Hepatobiliary: Distended gallbladder with layering milk calcium or calcified stones. No hepatic parenchymal abnormalities or biliary ductal dilatation identified. Pancreas: Unremarkable. No pancreatic ductal dilatation or surrounding inflammatory changes. Spleen: Normal in size without focal abnormality. Adrenals/Urinary Tract: No adrenal lesions. Kidneys are atrophic. There are numerous centimeter-sized cystic lesions in both kidneys that do not need further imaging follow up. No hydronephrosis or nephrolithiasis identified. Urinary bladder  empty. Stomach/Bowel: Stomach is within normal limits. Appendix appears normal. No evidence of bowel wall thickening, distention, or inflammatory changes. Delayed postcontrast images demonstrate no evidence of extravasation of contrast indicate the presence of active GI hemorrhage. Lymphatic: No suspicious adenopathy identified. Reproductive: Prostate brachytherapy seeds noted. Prostate appears mildly prominent. Other: Small abdominal wall periumbilical defect containing omental fat without evidence of incarceration Musculoskeletal: Diffuse osteoblastic changes with the differential including diffuse metastatic disease versus renal osteodystrophy. IMPRESSION: VASCULAR 1. Dense atheromatous changes noted for the aorta and its branches without evidence of significant occlusive changes, aneurysm or dissection. NON-VASCULAR 1. No evidence of extravasation of IV contrast to indicate the presence of active GI bleed. 2. Sequelae of end-stage renal disease. 3. Distended gallbladder with evidence of stones or sludge. 4. 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 5. Bilateral gynecomastia. Electronically Signed   By: Sammie Bench M.D.   On: 09/10/2022 11:20    Medications:   sodium chloride   Intravenous Once   allopurinol  100 mg Oral Daily   amiodarone  200 mg Oral Daily   atorvastatin  40 mg Oral Daily   Chlorhexidine Gluconate Cloth  6 each  Topical Q0600   metoprolol tartrate  12.5 mg Oral BID   midodrine  10 mg Oral Once   pantoprazole  40 mg Oral Daily   potassium chloride  30 mEq Oral Once   pregabalin  75 mg Oral BID   sodium chloride flush  3 mL Intravenous Q12H   sucroferric oxyhydroxide  500 mg Oral BID WC    Dialysis Orders: [facility]  3.75hrs, BFR 450, DFR 600,  EDW 103.7kg, 2K/ 2.5Ca   Access: LU AVF  Heparin none Mircera 100 mcg q2wks - last 2/28 Venofer '100mg'$  qHD x5 Calcitriol 0.93mg PO qHD   Assessment/Plan:  Hematochezia -  No active bleeding on CTA abd.  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 Management per GI.   ESRD -  on HD MWF.  Next HD on 09/14/22. Midodrine pre HD.  Hypertension/volume  - Hypotensive following dialysis.  Midodrine ordered this AM.  CXR with possible pulmonary edema and crackles on exam today.  Net UF 2L last night.  Under dry weight, lower on d/c.   Anemia of CKD - Hgb drop 8.4>7.3 this AM. 1 unit pRBC ordered.  ESA just given.  Tsat close to goal.  Ferritin 1126. Hold IV iron while here, continue as outpatient.   Secondary Hyperparathyroidism -  CCa at goal. Check phos.  Continue VDRA and binders - Velphoro  Nutrition - Renal diet w/fluid restrictions.  Neuropathy - home Lyrica dose ordered.   LJen Mow PA-C [facility] 09/12/2022,8:01 AM  LOS: 0 days

## 2022-09-12 NOTE — Progress Notes (Signed)
Blood transfusion completed without problems . He refused a follow up [facility] & [facility] . Wants to be discharged. MD notified.

## 2022-09-12 NOTE — Discharge Instructions (Signed)
Follow with Michell Heinrich, DO in 5-7 days  You can resume Eliquis tomorrow, 09/13/2022  Please get a complete blood count and chemistry panel checked by your Primary MD at your next visit, and again as instructed by your Primary MD. Please get your medications reviewed and adjusted by your Primary MD.  Please request your Primary MD to go over all Hospital Tests and Procedure/Radiological results at the follow up, please get all Hospital records sent to your Prim MD by signing hospital release before you go home.  In some cases, there will be blood work, cultures and biopsy results pending at the time of your discharge. Please request that your primary care M.D. goes through all the records of your hospital data and follows up on these results.  If you had Pneumonia of Lung problems at the Hospital: Please get a 2 view Chest X ray done in 6-8 weeks after hospital discharge or sooner if instructed by your Primary MD.  If you have Congestive Heart Failure: Please call your Cardiologist or Primary MD anytime you have any of the following symptoms:  1) 3 pound weight gain in 24 hours or 5 pounds in 1 week  2) shortness of breath, with or without a dry hacking cough  3) swelling in the hands, feet or stomach  4) if you have to sleep on extra pillows at night in order to breathe  Follow cardiac low salt diet and 1.5 lit/day fluid restriction.  If you have diabetes Accuchecks 4 times/day, Once in AM empty stomach and then before each meal. Log in all results and show them to your primary doctor at your next visit. If any glucose reading is under 80 or above 300 call your primary MD immediately.  If you have Seizure/Convulsions/Epilepsy: Please do not drive, operate heavy machinery, participate in activities at heights or participate in high speed sports until you have seen by Primary MD or a Neurologist and advised to do so again. Per [facility] statutes, patients with seizures are not  allowed to drive until they have been seizure-free for six months.  Use caution when using heavy equipment or power tools. Avoid working on ladders or at heights. Take showers instead of baths. Ensure the water temperature is not too high on the home water heater. Do not go swimming alone. Do not lock yourself in a room alone (i.e. bathroom). When caring for infants or small children, sit down when holding, feeding, or changing them to minimize risk of injury to the child in the event you have a seizure. Maintain good sleep hygiene. Avoid alcohol.   If you had Gastrointestinal Bleeding: Please ask your Primary MD to check a complete blood count within one week of discharge or at your next visit. Your endoscopic/colonoscopic biopsies that are pending at the time of discharge, will also need to followed by your Primary MD.  Get Medicines reviewed and adjusted. Please take all your medications with you for your next visit with your Primary MD  Please request your Primary MD to go over all hospital tests and procedure/radiological results at the follow up, please ask your Primary MD to get all Hospital records sent to his/her office.  If you experience worsening of your admission symptoms, develop shortness of breath, life threatening emergency, suicidal or homicidal thoughts you must seek medical attention immediately by calling 911 or calling your MD immediately  if symptoms less severe.  You must read complete instructions/literature along with all the possible adverse reactions/side  effects for all the Medicines you take and that have been prescribed to you. Take any new Medicines after you have completely understood and accpet all the possible adverse reactions/side effects.   Do not drive or operate heavy machinery when taking Pain medications.   Do not take more than prescribed Pain, Sleep and Anxiety Medications  Special Instructions: If you have smoked or chewed Tobacco  in the last 2 yrs  please stop smoking, stop any regular Alcohol  and or any Recreational drug use.  Wear Seat belts while driving.  Please note You were cared for by a hospitalist during your hospital stay. If you have any questions about your discharge medications or the care you received while you were in the hospital after you are discharged, you can call the unit and asked to speak with the hospitalist on call if the hospitalist that took care of you is not available. Once you are discharged, your primary care physician will handle any further medical issues. Please note that NO REFILLS for any discharge medications will be authorized once you are discharged, as it is imperative that you return to your primary care physician (or establish a relationship with a primary care physician if you do not have one) for your aftercare needs so that they can reassess your need for medications and monitor your lab values.  You can reach the hospitalist office at phone [phone] or fax [phone]   If you do not have a primary care physician, you can call [phone] for a physician referral.  Activity: As tolerated with Full fall precautions use walker/cane & assistance as needed    Diet: renal  Disposition Home

## 2022-09-13 ENCOUNTER — Telehealth: Payer: Self-pay | Admitting: Nephrology

## 2022-09-13 LAB — TYPE AND SCREEN
ABO/RH(D): O POS
Antibody Screen: NEGATIVE
Unit division: 0

## 2022-09-13 LAB — BPAM RBC
Blood Product Expiration Date: 202404012359
ISSUE DATE / TIME: 202403020926
Unit Type and Rh: 5100

## 2022-09-13 NOTE — Telephone Encounter (Signed)
Transition of Care Contact from [facility]   Date of Discharge: 09/11/21 Date of Contact: 09/12/21 Method of contact: phone Talked to patient   Patient contacted to discuss transition of care form recent hospitaliztion. Patient was admitted to [facility] from 09/10/22 to 09/12/22 with the discharge diagnosis of lower GIB.     Medication changes were reviewed.  Patient will follow up with is outpatient dialysis center 09/14/22.  Other follow up needs include none identified   Jen Mow, PA-C [employer]

## 2022-09-14 ENCOUNTER — Encounter (HOSPITAL_COMMUNITY): Payer: Self-pay | Admitting: Internal Medicine

## 2022-09-14 LAB — SURGICAL PATHOLOGY

## 2022-09-14 NOTE — Progress Notes (Signed)
Late Note Entry  Pt was d/c to home on Saturday. Contacted [facility] this morning to advise staff of pt's d/c and that pt should resume care today.   Melven Sartorius Renal Navigator [phone]

## 2022-09-16 ENCOUNTER — Encounter (HOSPITAL_COMMUNITY): Payer: Self-pay | Admitting: Emergency Medicine

## 2022-09-16 ENCOUNTER — Other Ambulatory Visit: Payer: Self-pay

## 2022-09-16 ENCOUNTER — Inpatient Hospital Stay (HOSPITAL_COMMUNITY)
Admission: EM | Admit: 2022-09-16 | Discharge: 2022-09-18 | DRG: 393 | Disposition: A | Discharge status: Home / Self Care | Payer: Medicare Other | Attending: Internal Medicine | Admitting: Internal Medicine

## 2022-09-16 DIAGNOSIS — Z87891 Personal history of nicotine dependence: Secondary | ICD-10-CM

## 2022-09-16 DIAGNOSIS — Z923 Personal history of irradiation: Secondary | ICD-10-CM

## 2022-09-16 DIAGNOSIS — N186 End stage renal disease: Secondary | ICD-10-CM

## 2022-09-16 DIAGNOSIS — M1000 Idiopathic gout, unspecified site: Secondary | ICD-10-CM | POA: Diagnosis present

## 2022-09-16 DIAGNOSIS — K5521 Angiodysplasia of colon with hemorrhage: Secondary | ICD-10-CM | POA: Diagnosis present

## 2022-09-16 DIAGNOSIS — I480 Paroxysmal atrial fibrillation: Secondary | ICD-10-CM | POA: Diagnosis not present

## 2022-09-16 DIAGNOSIS — K922 Gastrointestinal hemorrhage, unspecified: Secondary | ICD-10-CM | POA: Diagnosis present

## 2022-09-16 DIAGNOSIS — N189 Chronic kidney disease, unspecified: Secondary | ICD-10-CM | POA: Diagnosis present

## 2022-09-16 DIAGNOSIS — Z86010 Personal history of colonic polyps: Secondary | ICD-10-CM | POA: Diagnosis not present

## 2022-09-16 DIAGNOSIS — Z8546 Personal history of malignant neoplasm of prostate: Secondary | ICD-10-CM

## 2022-09-16 DIAGNOSIS — Y842 Radiological procedure and radiotherapy as the cause of abnormal reaction of the patient, or of later complication, without mention of misadventure at the time of the procedure: Secondary | ICD-10-CM | POA: Diagnosis present

## 2022-09-16 DIAGNOSIS — D631 Anemia in chronic kidney disease: Secondary | ICD-10-CM | POA: Diagnosis present

## 2022-09-16 DIAGNOSIS — I959 Hypotension, unspecified: Secondary | ICD-10-CM | POA: Diagnosis present

## 2022-09-16 DIAGNOSIS — I120 Hypertensive chronic kidney disease with stage 5 chronic kidney disease or end stage renal disease: Secondary | ICD-10-CM | POA: Diagnosis present

## 2022-09-16 DIAGNOSIS — R9431 Abnormal electrocardiogram [ECG] [EKG]: Secondary | ICD-10-CM | POA: Diagnosis present

## 2022-09-16 DIAGNOSIS — E11311 Type 2 diabetes mellitus with unspecified diabetic retinopathy with macular edema: Secondary | ICD-10-CM | POA: Diagnosis present

## 2022-09-16 DIAGNOSIS — K635 Polyp of colon: Secondary | ICD-10-CM | POA: Diagnosis present

## 2022-09-16 DIAGNOSIS — Z6831 Body mass index (BMI) 31.0-31.9, adult: Secondary | ICD-10-CM

## 2022-09-16 DIAGNOSIS — Z811 Family history of alcohol abuse and dependence: Secondary | ICD-10-CM

## 2022-09-16 DIAGNOSIS — K625 Hemorrhage of anus and rectum: Secondary | ICD-10-CM | POA: Diagnosis not present

## 2022-09-16 DIAGNOSIS — E1122 Type 2 diabetes mellitus with diabetic chronic kidney disease: Secondary | ICD-10-CM | POA: Diagnosis present

## 2022-09-16 DIAGNOSIS — K627 Radiation proctitis: Secondary | ICD-10-CM | POA: Diagnosis not present

## 2022-09-16 DIAGNOSIS — E782 Mixed hyperlipidemia: Secondary | ICD-10-CM | POA: Diagnosis present

## 2022-09-16 DIAGNOSIS — D62 Acute posthemorrhagic anemia: Secondary | ICD-10-CM | POA: Diagnosis present

## 2022-09-16 DIAGNOSIS — E669 Obesity, unspecified: Secondary | ICD-10-CM | POA: Diagnosis present

## 2022-09-16 DIAGNOSIS — Z992 Dependence on renal dialysis: Secondary | ICD-10-CM

## 2022-09-16 DIAGNOSIS — Z841 Family history of disorders of kidney and ureter: Secondary | ICD-10-CM

## 2022-09-16 DIAGNOSIS — K5730 Diverticulosis of large intestine without perforation or abscess without bleeding: Secondary | ICD-10-CM | POA: Diagnosis present

## 2022-09-16 DIAGNOSIS — M1990 Unspecified osteoarthritis, unspecified site: Secondary | ICD-10-CM | POA: Diagnosis present

## 2022-09-16 DIAGNOSIS — E875 Hyperkalemia: Secondary | ICD-10-CM | POA: Diagnosis present

## 2022-09-16 DIAGNOSIS — D120 Benign neoplasm of cecum: Secondary | ICD-10-CM | POA: Diagnosis present

## 2022-09-16 DIAGNOSIS — K219 Gastro-esophageal reflux disease without esophagitis: Secondary | ICD-10-CM | POA: Diagnosis present

## 2022-09-16 DIAGNOSIS — I4892 Unspecified atrial flutter: Secondary | ICD-10-CM | POA: Diagnosis present

## 2022-09-16 DIAGNOSIS — Z7901 Long term (current) use of anticoagulants: Secondary | ICD-10-CM | POA: Diagnosis not present

## 2022-09-16 DIAGNOSIS — I10 Essential (primary) hypertension: Secondary | ICD-10-CM | POA: Diagnosis present

## 2022-09-16 DIAGNOSIS — K633 Ulcer of intestine: Secondary | ICD-10-CM | POA: Diagnosis present

## 2022-09-16 DIAGNOSIS — D123 Benign neoplasm of transverse colon: Secondary | ICD-10-CM | POA: Diagnosis present

## 2022-09-16 DIAGNOSIS — I350 Nonrheumatic aortic (valve) stenosis: Secondary | ICD-10-CM | POA: Diagnosis present

## 2022-09-16 DIAGNOSIS — I440 Atrioventricular block, first degree: Secondary | ICD-10-CM | POA: Diagnosis present

## 2022-09-16 LAB — COMPREHENSIVE METABOLIC PANEL
ALT: 32 U/L (ref 0–44)
AST: 25 U/L (ref 15–41)
Albumin: 3.4 g/dL — ABNORMAL LOW (ref 3.5–5.0)
Alkaline Phosphatase: 156 U/L — ABNORMAL HIGH (ref 38–126)
Anion gap: 12 (ref 5–15)
BUN: 36 mg/dL — ABNORMAL HIGH (ref 8–23)
CO2: 28 mmol/L (ref 22–32)
Calcium: 8.4 mg/dL — ABNORMAL LOW (ref 8.9–10.3)
Chloride: 102 mmol/L (ref 98–111)
Creatinine, Ser: 8.59 mg/dL — ABNORMAL HIGH (ref 0.61–1.24)
GFR, Estimated: 6 mL/min — ABNORMAL LOW (ref >60)
Glucose, Bld: 147 mg/dL — ABNORMAL HIGH (ref 70–99)
Potassium: 3.5 mmol/L (ref 3.5–5.1)
Sodium: 142 mmol/L (ref 135–145)
Total Bilirubin: 0.5 mg/dL (ref 0.3–1.2)
Total Protein: 7.7 g/dL (ref 6.5–8.1)

## 2022-09-16 LAB — HEMOGLOBIN AND HEMATOCRIT, BLOOD
HCT: 27.5 % — ABNORMAL LOW (ref 39.0–52.0)
HCT: 30.8 % — ABNORMAL LOW (ref 39.0–52.0)
Hemoglobin: 8.2 g/dL — ABNORMAL LOW (ref 13.0–17.0)
Hemoglobin: 9.1 g/dL — ABNORMAL LOW (ref 13.0–17.0)

## 2022-09-16 LAB — CBC WITH DIFFERENTIAL/PLATELET
Abs Immature Granulocytes: 0.04 10*3/uL (ref 0.00–0.07)
Basophils Absolute: 0.1 10*3/uL (ref 0.0–0.1)
Basophils Relative: 1 %
Eosinophils Absolute: 0.2 10*3/uL (ref 0.0–0.5)
Eosinophils Relative: 2 %
HCT: 31.3 % — ABNORMAL LOW (ref 39.0–52.0)
Hemoglobin: 9.3 g/dL — ABNORMAL LOW (ref 13.0–17.0)
Immature Granulocytes: 0 %
Lymphocytes Relative: 25 %
Lymphs Abs: 2.3 10*3/uL (ref 0.7–4.0)
MCH: 30.5 pg (ref 26.0–34.0)
MCHC: 29.7 g/dL — ABNORMAL LOW (ref 30.0–36.0)
MCV: 102.6 fL — ABNORMAL HIGH (ref 80.0–100.0)
Monocytes Absolute: 0.9 10*3/uL (ref 0.1–1.0)
Monocytes Relative: 10 %
Neutro Abs: 5.8 10*3/uL (ref 1.7–7.7)
Neutrophils Relative %: 62 %
Platelets: 211 10*3/uL (ref 150–400)
RBC: 3.05 MIL/uL — ABNORMAL LOW (ref 4.22–5.81)
RDW: 18.7 % — ABNORMAL HIGH (ref 11.5–15.5)
WBC: 9.4 10*3/uL (ref 4.0–10.5)
nRBC: 0.2 % (ref 0.0–0.2)

## 2022-09-16 LAB — GLUCOSE, CAPILLARY: Glucose-Capillary: 85 mg/dL (ref 70–99)

## 2022-09-16 MED ORDER — PREGABALIN 75 MG PO CAPS
75.0000 mg | ORAL_CAPSULE | Freq: Every day | ORAL | Status: DC
  Administered 2022-09-16 – 2022-09-18 (×3): 75 mg via ORAL
  Filled 2022-09-16: qty 3
  Filled 2022-09-16 (×2): qty 1

## 2022-09-16 MED ORDER — SODIUM CHLORIDE 0.9 % IV SOLN
125.0000 mg | INTRAVENOUS | Status: AC
  Administered 2022-09-16: 125 mg via INTRAVENOUS
  Filled 2022-09-16: qty 10

## 2022-09-16 MED ORDER — MIDODRINE HCL 5 MG PO TABS
10.0000 mg | ORAL_TABLET | ORAL | Status: DC
  Administered 2022-09-16 – 2022-09-18 (×2): 10 mg via ORAL
  Filled 2022-09-16 (×2): qty 2

## 2022-09-16 MED ORDER — AMIODARONE HCL 200 MG PO TABS: 200.0000 mg | ORAL_TABLET | Freq: Every day | ORAL | Status: DC

## 2022-09-16 MED ORDER — PANTOPRAZOLE SODIUM 40 MG PO TBEC
40.0000 mg | DELAYED_RELEASE_TABLET | Freq: Every day | ORAL | Status: DC
  Administered 2022-09-16 – 2022-09-18 (×3): 40 mg via ORAL
  Filled 2022-09-16 (×3): qty 1

## 2022-09-16 MED ORDER — PEG-KCL-NACL-NASULF-NA ASC-C 100 G PO SOLR
0.5000 | Freq: Once | ORAL | Status: AC
  Administered 2022-09-16: 100 g via ORAL
  Filled 2022-09-16: qty 1

## 2022-09-16 MED ORDER — CALCITRIOL 0.25 MCG PO CAPS
0.2500 ug | ORAL_CAPSULE | ORAL | Status: DC
  Administered 2022-09-18: 0.25 ug via ORAL
  Filled 2022-09-16: qty 1

## 2022-09-16 MED ORDER — ALLOPURINOL 100 MG PO TABS
100.0000 mg | ORAL_TABLET | Freq: Every day | ORAL | Status: DC
  Administered 2022-09-16 – 2022-09-18 (×3): 100 mg via ORAL
  Filled 2022-09-16 (×3): qty 1

## 2022-09-16 MED ORDER — SODIUM ZIRCONIUM CYCLOSILICATE 10 G PO PACK
10.0000 g | PACK | ORAL | Status: DC
  Filled 2022-09-16: qty 1

## 2022-09-16 MED ORDER — ACETAMINOPHEN 650 MG RE SUPP: 650.0000 mg | Freq: Four times a day (QID) | RECTAL | Status: DC | PRN

## 2022-09-16 MED ORDER — PEG-KCL-NACL-NASULF-NA ASC-C 100 G PO SOLR: 1.0000 | Freq: Once | ORAL | Status: DC

## 2022-09-16 MED ORDER — PEG-KCL-NACL-NASULF-NA ASC-C 100 G PO SOLR: 0.5000 | Freq: Once | ORAL | Status: DC

## 2022-09-16 MED ORDER — ALBUTEROL SULFATE (2.5 MG/3ML) 0.083% IN NEBU: 2.5000 mg | INHALATION_SOLUTION | Freq: Four times a day (QID) | RESPIRATORY_TRACT | Status: DC | PRN

## 2022-09-16 MED ORDER — CINACALCET HCL 30 MG PO TABS
30.0000 mg | ORAL_TABLET | Freq: Every day | ORAL | Status: DC
  Administered 2022-09-16 – 2022-09-18 (×3): 30 mg via ORAL
  Filled 2022-09-16 (×3): qty 1

## 2022-09-16 MED ORDER — TRIMETHOBENZAMIDE HCL 100 MG/ML IM SOLN: 200.0000 mg | Freq: Four times a day (QID) | INTRAMUSCULAR | Status: DC | PRN

## 2022-09-16 MED ORDER — SUCROFERRIC OXYHYDROXIDE 500 MG PO CHEW
500.0000 mg | CHEWABLE_TABLET | Freq: Two times a day (BID) | ORAL | Status: DC
  Administered 2022-09-17 – 2022-09-18 (×4): 500 mg via ORAL
  Filled 2022-09-16 (×6): qty 1

## 2022-09-16 MED ORDER — MESALAMINE 1000 MG RE SUPP
1000.0000 mg | Freq: Two times a day (BID) | RECTAL | Status: DC
  Administered 2022-09-16 – 2022-09-18 (×4): 1000 mg via RECTAL
  Filled 2022-09-16 (×6): qty 1

## 2022-09-16 MED ORDER — ATORVASTATIN CALCIUM 40 MG PO TABS
40.0000 mg | ORAL_TABLET | Freq: Every day | ORAL | Status: DC
  Administered 2022-09-16 – 2022-09-18 (×3): 40 mg via ORAL
  Filled 2022-09-16 (×3): qty 1

## 2022-09-16 MED ORDER — ACETAMINOPHEN 325 MG PO TABS
650.0000 mg | ORAL_TABLET | Freq: Four times a day (QID) | ORAL | Status: DC | PRN
  Filled 2022-09-16: qty 2

## 2022-09-16 MED ORDER — CHLORHEXIDINE GLUCONATE CLOTH 2 % EX PADS
6.0000 | MEDICATED_PAD | Freq: Every day | CUTANEOUS | Status: DC
  Administered 2022-09-18: 6 via TOPICAL

## 2022-09-16 MED ORDER — HYDROCODONE-ACETAMINOPHEN 5-325 MG PO TABS: 1.0000 | ORAL_TABLET | Freq: Every day | ORAL | Status: DC | PRN

## 2022-09-16 MED ORDER — PEG-KCL-NACL-NASULF-NA ASC-C 100 G PO SOLR
0.5000 | Freq: Once | ORAL | Status: DC
  Filled 2022-09-16: qty 1

## 2022-09-16 MED ORDER — SODIUM CHLORIDE 0.9% FLUSH
3.0000 mL | Freq: Two times a day (BID) | INTRAVENOUS | Status: DC
  Administered 2022-09-16 – 2022-09-17 (×3): 3 mL via INTRAVENOUS

## 2022-09-16 NOTE — H&P (View-Only) (Signed)
Consultation Note   Referring Provider:  [facility] PCP: Michell Heinrich, DO Primary Gastroenterologist: Althia Forts ( saw Dr.Dorsey in hospital)        Reason for consultation: rectal bleeding   Hospital Day: 1  ASSESSMENT  # Painless hematochezia.   77 year old male with hemodynamically stable, painless hematochezia.  Bleeding possibly secondary to known radiation proctitis or post polypectomy bleed. Hgb is 9.3 in ED, up from 7.3 on 3-24  Upper and lower endoscopy 09/11/22 for rectal bleeding. 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 A 10 mm transverse colon polyp had stigmata of recent bleeding and therefore was removed with a hot snare followed by clip placement  # Gastric nodule / duodenal polyps on EGD earlier this month.  Not removed in setting of active GI bleed  # Atrial fibrillation, on Eliquis.   Last dose of Eliquis was around 730 last evening  # ESRD on HD  PLAN -Probable flexible sigmoidoscopy tomorrow. Last Eliquis dose was last evening. Ideally he would take a bowel prep since not ideal to use APC with stool in colon and his recent bowel prep was inadequate -Following repeat lower endoscopy if bleeding from radiation proctitis will treat with either topical mesalamine or carafate enemas.  -Eventual repeat colonoscopy with a 2-day prep so polyps can be removed.  -Eventual repeat EGD and / or EUS for evaluation of gastric nodule and duodenal polyps   HPI:  Patient is a 77 y.o. year old male with a past medical history of  hypertension, atrial fibrillation/flutter on Eliquis, ESRD on HD every MWF, chronic anemia, diabetes mellitus type 2 and prostate cancer s/p radiation seed implantation.    See PMH for any additional medical problems.  Patient came to ED this am for rectal bleeding. He was hospitalized for the same last week. He had an EGD and colonoscopy. See results below.  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  At 10 mm semipedunculated transverse colon had stigmata of recent bleeding and it was removed.   Rectal bleeding had stopped by the time of hospital discharge last week.  Yesterday he had an episode of recurrent, painless rectal bleeding.  Then this morning he woke up around 2 AM incontinent of a large amount of bright red blood. He had another episode of painless rectal bleeding on the way to the ED this morning  EGD 09/11/22 Normal esophagus, hiatal hernia, a single submucosal nodule in stomach, two duodenal polyps. No specimens collected.   Colonoscopy 09/11/22 Inadequate prep -15 mm sessile cecal polyp found but not removed.  - A 10 mm semi-pedunculated  polyp with stigmata of recent bleeding was found in transverse colon .  Since it was thought to have contributed to the rectal bleeding the polyp was removed by hot snare and clips were placed . Mucosal changes of radiation proctitis found and treated with APC.    Recent Imaging and Labs: DG Chest Portable 1 View  Result Date: 09/10/2022 CLINICAL DATA:  Provided history: Hypoxia. EXAM: PORTABLE CHEST 1 VIEW COMPARISON:  Prior chest radiographs 06/16/2022 and earlier. FINDINGS: Shallow inspiration radiograph, accentuating the cardiac silhouette and limiting evaluation of heart size. Aortic atherosclerosis. Bilateral perihilar and left lung base opacities, which may reflect atelectasis, edema and/or atypical/viral infection. No  evidence of pleural effusion or pneumothorax. No acute osseous abnormality identified. Chronic widening of the left acromioclavicular joint. A vascular stent projects in the left subclavian/axillary region. IMPRESSION: Shallow inspiration radiograph. Bilateral perihilar and left lung base opacities which may reflect atelectasis, edema and/or atypical/viral infection. Electronically Signed   By: Kellie Simmering D.O.   On: 09/10/2022 12:17   CT ANGIO GI BLEED  Result Date: 09/10/2022 CLINICAL DATA:  Rectal bleeding EXAM:  CTA ABDOMEN AND PELVIS WITHOUT AND WITH CONTRAST TECHNIQUE: Multidetector CT imaging of the abdomen and pelvis was performed using the standard protocol during bolus administration of intravenous contrast. Multiplanar reconstructed images and MIPs were obtained and reviewed to evaluate the vascular anatomy. RADIATION DOSE REDUCTION: This exam was performed according to the departmental dose-optimization program which includes automated exposure control, adjustment of the mA and/or kV according to patient size and/or use of iterative reconstruction technique. CONTRAST:  75 mL OMNIPAQUE IOHEXOL 350 MG/ML SOLN COMPARISON:  08/19/2022 FINDINGS: VASCULAR Aorta: Dense atheromatous calcifications. No aneurysm or dissection. Celiac: Atheromatous changes no evidence of occlusion. SMA: Atheromatous changes no evidence of occlusion. Renals: Atheromatous changes no evidence of occlusion. IMA: Atheromatous changes no evidence of occlusion. Inflow: Patent without evidence of aneurysm, dissection, vasculitis or significant stenosis. Proximal Outflow: Bilateral common femoral and visualized portions of the superficial and profunda femoral arteries are patent without evidence of aneurysm, dissection, vasculitis or significant stenosis. Veins: No obvious venous abnormality within the limitations of this arterial phase study. Review of the MIP images confirms the above findings. NON-VASCULAR Lower chest: Dependent basilar subsegmental atelectasis. No pleural or pericardial effusion identified. There is bilateral gynecomastia. Hepatobiliary: Distended gallbladder with layering milk calcium or calcified stones. No hepatic parenchymal abnormalities or biliary ductal dilatation identified. Pancreas: Unremarkable. No pancreatic ductal dilatation or surrounding inflammatory changes. Spleen: Normal in size without focal abnormality. Adrenals/Urinary Tract: No adrenal lesions. Kidneys are atrophic. There are numerous centimeter-sized cystic  lesions in both kidneys that do not need further imaging follow up. No hydronephrosis or nephrolithiasis identified. Urinary bladder empty. Stomach/Bowel: Stomach is within normal limits. Appendix appears normal. No evidence of bowel wall thickening, distention, or inflammatory changes. Delayed postcontrast images demonstrate no evidence of extravasation of contrast indicate the presence of active GI hemorrhage. Lymphatic: No suspicious adenopathy identified. Reproductive: Prostate brachytherapy seeds noted. Prostate appears mildly prominent. Other: Small abdominal wall periumbilical defect containing omental fat without evidence of incarceration Musculoskeletal: Diffuse osteoblastic changes with the differential including diffuse metastatic disease versus renal osteodystrophy. IMPRESSION: VASCULAR 1. Dense atheromatous changes noted for the aorta and its branches without evidence of significant occlusive changes, aneurysm or dissection. NON-VASCULAR 1. No evidence of extravasation of IV contrast to indicate the presence of active GI bleed. 2. Sequelae of end-stage renal disease. 3. Distended gallbladder with evidence of stones or sludge. 4. 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 5. Bilateral gynecomastia. Electronically Signed   By: Sammie Bench M.D.   On: 09/10/2022 11:20   CT Abdomen Pelvis W Contrast  Result Date: 08/19/2022 CLINICAL DATA:  Epigastric pain EXAM: CT ABDOMEN AND PELVIS WITH CONTRAST TECHNIQUE: Multidetector CT imaging of the abdomen and pelvis was performed using the standard protocol following bolus administration of intravenous contrast. RADIATION DOSE REDUCTION: This exam was performed according to the departmental dose-optimization program which includes automated exposure control, adjustment of the mA and/or kV according to patient size and/or use of iterative reconstruction technique. CONTRAST:  70m OMNIPAQUE IOHEXOL 350 MG/ML  SOLN COMPARISON:  06/12/2022 FINDINGS:  Lower chest: Linear areas of atelectasis in the lung bases. Coronary artery and aortic calcifications. Hepatobiliary: Small layering gallstones within the gallbladder. No focal hepatic abnormality. Pancreas: No focal abnormality or ductal dilatation. Spleen: No focal abnormality.  Normal size. Adrenals/Urinary Tract: Numerous small cysts in the kidneys bilaterally, unchanged since prior study. Kidneys are mildly atrophic. No hydronephrosis. Adrenal glands and urinary bladder unremarkable. Stomach/Bowel: Normal appendix. Moderate stool burden throughout the colon. Stomach, large and small bowel grossly unremarkable. Vascular/Lymphatic: Aortic atherosclerosis. No evidence of aneurysm or adenopathy. Reproductive: Prostate enlargement. Other: No free fluid or free air. Small umbilical hernia containing fat. Musculoskeletal: No acute bony abnormality. IMPRESSION: Cholelithiasis.  No CT evidence of acute cholecystitis. Bibasilar atelectasis. Moderate stool burden throughout the colon. Coronary artery disease, aortic atherosclerosis. Prostate enlargement. Electronically Signed   By: Rolm Baptise M.D.   On: 08/19/2022 02:24      Labs:  Recent Labs    09/16/22 0355  WBC 9.4  HGB 9.3*  HCT 31.3*  PLT 211   Recent Labs    09/16/22 0355  NA 142  K 3.5  CL 102  CO2 28  GLUCOSE 147*  BUN 36*  CREATININE 8.59*  CALCIUM 8.4*   Recent Labs    09/16/22 0355  PROT 7.7  ALBUMIN 3.4*  AST 25  ALT 32  ALKPHOS 156*  BILITOT 0.5   No results for input(s): "HEPBSAG", "HCVAB", "HEPAIGM", "HEPBIGM" in the last 72 hours. No results for input(s): "LABPROT", "INR" in the last 72 hours.  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 ([place]) 12/07/2019   Dialysis patient ([place])    Gout    Hypercalcemia 11/14/2021   Hyperkalemia 10/19/2018   Hypertension    Hypotension 06/13/2022   Right posterior capsular opacification 02/27/2021   Septic shock ([place])    Vitreous hemorrhage of right eye ([place]) 10/24/2019    Past Surgical History:  Procedure Laterality Date   A/V FISTULAGRAM Left 08/17/2017   Procedure: A/V FISTULAGRAM;  Surgeon: Katha Cabal, MD;  Location: [facility];  Service: Cardiovascular;  Laterality: Left;   A/V SHUNT INTERVENTION N/A 08/17/2017   Procedure: A/V SHUNT INTERVENTION;  Surgeon: Katha Cabal, MD;  Location: [facility];  Service: Cardiovascular;  Laterality: N/A;   AV FISTULA PLACEMENT Left 05/31/2015   Procedure: ARTERIOVENOUS (AV) FISTULA CREATION;  Surgeon: Katha Cabal, MD;  Location: [facility];  Service: Vascular;  Laterality: Left;   COLONOSCOPY WITH PROPOFOL N/A 09/11/2022   Procedure: COLONOSCOPY WITH PROPOFOL;  Surgeon: Sharyn Creamer, MD;  Location: [facility];  Service: Gastroenterology;  Laterality: N/A;   ESOPHAGOGASTRODUODENOSCOPY (EGD) WITH PROPOFOL N/A 09/11/2022   Procedure: ESOPHAGOGASTRODUODENOSCOPY (EGD) WITH PROPOFOL;  Surgeon: Sharyn Creamer, MD;  Location: [facility];  Service: Gastroenterology;  Laterality: N/A;   excision bone spurs Bilateral 1989   feet   HEMOSTASIS CLIP PLACEMENT  09/11/2022   Procedure: HEMOSTASIS CLIP PLACEMENT;  Surgeon: Sharyn Creamer, MD;  Location[facility];  Service: Gastroenterology;;   HOT HEMOSTASIS N/A 09/11/2022   Procedure: HOT HEMOSTASIS (ARGON PLASMA COAGULATION/BICAP);  Surgeon: Sharyn Creamer, MD;  Location: [facility];  Service: Gastroenterology;  Laterality: N/A;   INCISION AND DRAINAGE PERIRECTAL ABSCESS N/A 06/15/2022   Procedure: IRRIGATION AND  DEBRIDEMENT PERIRECTAL ABSCESS;  Surgeon: Aviva Signs, MD;  Location: [facility];  Service: General;  Laterality: N/A;   KNEE SURGERY Left 1998   arthroscopy   POLYPECTOMY  09/11/2022   Procedure: POLYPECTOMY;  Surgeon: Sharyn Creamer, MD;  Location: [facility] ENDOSCOPY;  Service: Gastroenterology;;   SHOULDER SURGERY Left 1994   rotator cuff    Family History  Problem Relation Age of Onset   Kidney disease Mother    Alcoholism Father    Alcoholism Brother     Prior to Admission medications   Medication Sig Start Date End Date Taking? Authorizing Provider  allopurinol (ZYLOPRIM) 100 MG tablet Take 100 mg by mouth daily. 09/13/21  Yes [provider]  amiodarone (PACERONE) 200 MG tablet Take 1 tablet (200 mg total) by mouth 2 (two) times daily. On 07/09/22, start amiodarone 200 mg once daily Patient taking differently: Take 200 mg by mouth daily. On 07/09/22, start amiodarone 200 mg once daily 06/26/22  Yes Tat, David, MD  atorvastatin (LIPITOR) 40 MG tablet Take 40 mg by mouth daily.  10/30/14  Yes [provider]  cinacalcet (SENSIPAR) 30 MG tablet Take 30 mg by mouth daily. 08/25/22  Yes [provider]  ELIQUIS 5 MG TABS tablet Take 5 mg by mouth 2 (two) times daily. 10/07/19  Yes [provider]  HYDROcodone-acetaminophen (NORCO/VICODIN) 5-325 MG tablet Take 1 tablet by mouth every 6 (six) hours as needed for severe pain. Patient taking differently: Take 1 tablet by mouth daily as needed for moderate pain. 06/26/22  Yes Tat, Shanon Brow, MD  LOKELMA 10 g PACK packet Take 10 g by mouth 2 (two) times a week. Tuesday and Thursday 06/08/22  Yes [provider]  Methoxy PEG-Epoetin Beta (MIRCERA IJ) Mircera 06/13/21 12/18/22 Yes [provider]  metoprolol tartrate (LOPRESSOR) 25 MG tablet Take 0.5 tablets (12.5 mg total) by mouth 2 (two) times daily. Patient taking differently: Take 25 mg by mouth daily. 06/26/22  Yes Tat, Shanon Brow, MD  midodrine (PROAMATINE) 5  MG tablet Take 10 mg by mouth 3 (three) times a week. Prior to dialysis M-W-F 08/13/22  Yes [provider]  omeprazole (PRILOSEC) 20 MG capsule Take 20 mg by mouth every morning.    Yes [provider]  pregabalin (LYRICA) 75 MG capsule Take 1 capsule (75 mg total) by mouth 2 (two) times daily. Patient taking differently: Take 75 mg by mouth daily. 06/26/22  Yes Tat, Shanon Brow, MD  VELPHORO 500 MG chewable tablet Chew 500 mg by mouth 2 (two) times daily with a meal. 06/03/22  Yes [provider]    Current Facility-Administered Medications  Medication Dose Route Frequency Provider Last Rate Last Admin   acetaminophen (TYLENOL) tablet 650 mg  650 mg Oral Q6H PRN Norval Morton, MD       Or   acetaminophen (TYLENOL) suppository 650 mg  650 mg Rectal Q6H PRN Fuller Plan A, MD       albuterol (PROVENTIL) (2.5 MG/3ML) 0.083% nebulizer solution 2.5 mg  2.5 mg Nebulization Q6H PRN Smith, Rondell A, MD       allopurinol (ZYLOPRIM) tablet 100 mg  100 mg Oral Daily Smith, Rondell A, MD       atorvastatin (LIPITOR) tablet 40 mg  40 mg Oral Daily Norval Morton, MD  cinacalcet (SENSIPAR) tablet 30 mg  30 mg Oral Q breakfast Fuller Plan A, MD   30 mg at 09/16/22 0830   HYDROcodone-acetaminophen (NORCO/VICODIN) 5-325 MG per tablet 1 tablet  1 tablet Oral Daily PRN Norval Morton, MD       mesalamine (CANASA) suppository 1,000 mg  1,000 mg Rectal BID Godfrey Pick, MD   1,000 mg at 09/16/22 0555   midodrine (PROAMATINE) tablet 10 mg  10 mg Oral Once per day on Mon Wed Fri Smith, Eustaquio Boyden A, MD   10 mg at 09/16/22 0830   pantoprazole (PROTONIX) EC tablet 40 mg  40 mg Oral Daily Smith, Rondell A, MD       pregabalin (LYRICA) capsule 75 mg  75 mg Oral Daily Smith, Rondell A, MD       sodium chloride flush (NS) 0.9 % injection 3 mL  3 mL Intravenous Q12H Norval Morton, MD       [START ON 09/17/2022] sodium zirconium cyclosilicate (LOKELMA) packet 10 g  10 g Oral Once per day  on Mon Thu Smith, Eustaquio Boyden A, MD       sucroferric oxyhydroxide [facility]) chewable tablet 500 mg  500 mg Oral BID WC Smith, Rondell A, MD       trimethobenzamide (TIGAN) injection 200 mg  200 mg Intramuscular Q6H PRN Norval Morton, MD       Current Outpatient Medications  Medication Sig Dispense Refill   allopurinol (ZYLOPRIM) 100 MG tablet Take 100 mg by mouth daily.     amiodarone (PACERONE) 200 MG tablet Take 1 tablet (200 mg total) by mouth 2 (two) times daily. On 07/09/22, start amiodarone 200 mg once daily (Patient taking differently: Take 200 mg by mouth daily. On 07/09/22, start amiodarone 200 mg once daily)     atorvastatin (LIPITOR) 40 MG tablet Take 40 mg by mouth daily.      cinacalcet (SENSIPAR) 30 MG tablet Take 30 mg by mouth daily.     ELIQUIS 5 MG TABS tablet Take 5 mg by mouth 2 (two) times daily.     HYDROcodone-acetaminophen (NORCO/VICODIN) 5-325 MG tablet Take 1 tablet by mouth every 6 (six) hours as needed for severe pain. (Patient taking differently: Take 1 tablet by mouth daily as needed for moderate pain.) 10 tablet 0   LOKELMA 10 g PACK packet Take 10 g by mouth 2 (two) times a week. Tuesday and Thursday     Methoxy PEG-Epoetin Beta (MIRCERA IJ) Mircera     metoprolol tartrate (LOPRESSOR) 25 MG tablet Take 0.5 tablets (12.5 mg total) by mouth 2 (two) times daily. (Patient taking differently: Take 25 mg by mouth daily.)     midodrine (PROAMATINE) 5 MG tablet Take 10 mg by mouth 3 (three) times a week. Prior to dialysis M-W-F     omeprazole (PRILOSEC) 20 MG capsule Take 20 mg by mouth every morning.      pregabalin (LYRICA) 75 MG capsule Take 1 capsule (75 mg total) by mouth 2 (two) times daily. (Patient taking differently: Take 75 mg by mouth daily.) 10 capsule 0   VELPHORO 500 MG chewable tablet Chew 500 mg by mouth 2 (two) times daily with a meal.      Allergies as of 09/16/2022   (No Known Allergies)    Social History   Socioeconomic History   Marital  status: Widowed    Spouse name: Not on file   Number of children: Not on file   Years of education: Not on  file   Highest education level: Not on file  Occupational History   Not on file  Tobacco Use   Smoking status: Former    Types: Cigarettes    Quit date: 10/01/1978    Years since quitting: 43.9   Smokeless tobacco: Never  Vaping Use   Vaping Use: Never used  Substance and Sexual Activity   Alcohol use: No   Drug use: No   Sexual activity: Not on file  Other Topics Concern   Not on file  Social History Narrative   Not on file   Social Determinants of Health   Financial Resource Strain: Not on file  Food Insecurity: No Food Insecurity (09/16/2022)   Hunger Vital Sign    Worried About Running Out of Food in the Last Year: Never true    Ran Out of Food in the Last Year: Never true  Transportation Needs: No Transportation Needs (09/16/2022)   PRAPARE - Hydrologist (Medical): No    Lack of Transportation (Non-Medical): No  Physical Activity: Not on file  Stress: Not on file  Social Connections: Not on file  Intimate Partner Violence: Not At Risk (09/16/2022)   Humiliation, Afraid, Rape, and Kick questionnaire    Fear of Current or Ex-Partner: No    Emotionally Abused: No    Physically Abused: No    Sexually Abused: No    Review of Systems: All systems reviewed and negative except where noted in HPI.  Physical Exam: Vital signs in last 24 hours: Temp:  [97.4 F (36.3 C)-97.6 F (36.4 C)] 97.6 F (36.4 C) (03/06 0830) Pulse Rate:  [64-100] 67 (03/06 0830) Resp:  [15-22] 19 (03/06 0830) BP: (106-121)/(54-72) 107/60 (03/06 0830) SpO2:  [90 %-100 %] 100 % (03/06 0830)    General:  Alert male in NAD Psych:  Pleasant, cooperative. Normal mood and affect Eyes: Pupils equal Ears:  Normal auditory acuity Nose: No deformity, discharge or lesions Neck:  Supple, no masses felt Lungs:  Clear to auscultation.  Heart:  Regular rate, regular  rhythm, murmur present  Abdomen:  Soft, nondistended, nontender, active bowel sounds, no masses felt Rectal :  very scant amount of watery bloody fluid in vault.  Msk: Symmetrical without gross deformities.  Neurologic:  Alert, oriented, grossly normal neurologically Extremities : No edema Skin:  Intact without significant lesions.    Intake/Output from previous day: No intake/output data recorded. Intake/Output this shift:  No intake/output data recorded.    Principal Problem:   Rectal bleeding Active Problems:   GI bleed    Tye Savoy, NP-C @  09/16/2022, 9:21 AM

## 2022-09-16 NOTE — Plan of Care (Signed)
  Problem: Education: Goal: Knowledge of General Education information will improve Description: Including pain rating scale, medication(s)/side effects and non-pharmacologic comfort measures Outcome: Progressing   Problem: Clinical Measurements: Goal: Respiratory complications will improve Outcome: Progressing   Problem: Safety: Goal: Ability to remain free from injury will improve 09/16/2022 0810 by Racheal Patches, RN Outcome: Progressing 09/16/2022 0810 by Racheal Patches, RN Outcome: Progressing

## 2022-09-16 NOTE — H&P (Signed)
History and Physical    Patient: Justin [name] DOB: 1946/01/17 DOA: 09/16/2022 DOS: the patient was seen and examined on 09/16/2022 PCP: Michell Heinrich, DO  Patient coming from: Home  Chief Complaint:  Chief Complaint  Patient presents with   Rectal Bleeding   HPI: Justin [name] is a 77 y.o. male with medical history significant of hypertension, paroxysmal atrial fibrillation on Eliquis, first-degree AV block, ESRD on HD (MWF), anemia, back pain, gout, and prostate cancer in 2009 who present with complaints of rectal bleeding over the last 2 days.  Patient had just recently been hospitalized from 2/29-3/2 for rectal bleeding thought secondary to lower GI bleed.  He underwent an EGD on 3/1 which showed a small stomach nodule as well as 2 duodenal polyps.  These were not removed in the setting of acute GI bleed.  GI recommends repeat EGD/EUS as an outpatient for further evaluation.  Colonoscopy showed a 15 mm polyp in the cecum that was sessile, not removed due to concern for GI bleed.  Also had a 10 mm polyp in the transverse colon with stigmata of recent bleeding.  This was removed with a hot snare.  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  The area was treated with APC.  Hemoglobin had dropped down to as low as 7.3 and he received 1 unit of PRBCs prior to being discharged.  He had been doing well at home he resumed Eliquis 3 days ago.  For the last 2 days he has had bright red blood per rectum which have been progressively getting worse.  Denies having any abdominal pain, lightheadedness, shortness of breath, fever, chills, nausea, or vomiting.  In the emergency department patient was noted to be afebrile with relatively stable vital signs.  Labs significant for hemoglobin 9.3, potassium 3.5, BUN 36, and creatinine 8.59.  Patient has been typed and screened for possible need of blood products.  [facility] have been  consulted and patient had been ordered a mesalamine suppository.   Review of Systems: As mentioned in the history of present illness. All other systems reviewed and are negative. 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 ([place]) 12/07/2019   Dialysis patient ([place])    Gout    Hypercalcemia 11/14/2021   Hyperkalemia 10/19/2018   Hypertension    Hypotension 06/13/2022   Right posterior capsular opacification 02/27/2021   Septic shock ([place])    Vitreous hemorrhage of right eye ([place]) 10/24/2019   Past Surgical History:  Procedure Laterality Date   A/V FISTULAGRAM Left 08/17/2017   Procedure: A/V FISTULAGRAM;  Surgeon: Katha Cabal, MD;  Location: [place] [facility];  Service: Cardiovascular;  Laterality: Left;   A/V SHUNT INTERVENTION N/A 08/17/2017   Procedure: A/V SHUNT INTERVENTION;  Surgeon: Katha Cabal, MD;  Location: [facility];  Service: Cardiovascular;  Laterality: N/A;   AV FISTULA PLACEMENT Left 05/31/2015   Procedure: ARTERIOVENOUS (AV) FISTULA CREATION;  Surgeon: Katha Cabal, MD;  Location: [facility];  Service: Vascular;  Laterality: Left;   COLONOSCOPY WITH PROPOFOL N/A 09/11/2022   Procedure: COLONOSCOPY WITH PROPOFOL;  Surgeon: Sharyn Creamer, MD;  Location: [facility];  Service: Gastroenterology;  Laterality: N/A;   ESOPHAGOGASTRODUODENOSCOPY (EGD) WITH PROPOFOL N/A 09/11/2022   Procedure: ESOPHAGOGASTRODUODENOSCOPY (EGD) WITH PROPOFOL;  Surgeon: Sharyn Creamer, MD;  Location: [facility];  Service: Gastroenterology;  Laterality: N/A;   excision bone spurs  Bilateral 1989   feet   HEMOSTASIS CLIP PLACEMENT  09/11/2022   Procedure: HEMOSTASIS CLIP PLACEMENT;  Surgeon: Sharyn Creamer, MD;  [facility];  Service: Gastroenterology;;   HOT HEMOSTASIS N/A 09/11/2022   Procedure: HOT HEMOSTASIS (ARGON PLASMA COAGULATION/BICAP);  Surgeon: Sharyn Creamer, MD;  Location: [place];  Service: Gastroenterology;  Laterality: N/A;   INCISION AND DRAINAGE PERIRECTAL ABSCESS N/A 06/15/2022   Procedure: IRRIGATION AND DEBRIDEMENT PERIRECTAL ABSCESS;  Surgeon: Aviva Signs, MD;  Location: [facility];  Service: General;  Laterality: N/A;   KNEE SURGERY Left 1998   arthroscopy   POLYPECTOMY  09/11/2022   Procedure: POLYPECTOMY;  Surgeon: Sharyn Creamer, MD;  Location[facility]COPY;  Service: Gastroenterology;;   SHOULDER SURGERY Left 1994   rotator cuff   Social History:  reports that he quit smoking about 43 years ago. His smoking use included cigarettes. He has never used smokeless tobacco. He reports that he does not drink alcohol and does not use drugs.  No Known Allergies  Family History  Problem Relation Age of Onset   Kidney disease Mother    Alcoholism Father    Alcoholism Brother     Prior to Admission medications   Medication Sig Start Date End Date Taking? Authorizing Provider  allopurinol (ZYLOPRIM) 100 MG tablet Take 100 mg by mouth daily. 09/13/21  Yes [provider]  amiodarone (PACERONE) 200 MG tablet Take 1 tablet (200 mg total) by mouth 2 (two) times daily. On 07/09/22, start amiodarone 200 mg once daily Patient taking differently: Take 200 mg by mouth daily. On 07/09/22, start amiodarone 200 mg once daily 06/26/22  Yes Tat, David, MD  atorvastatin (LIPITOR) 40 MG tablet Take 40 mg by mouth daily.  10/30/14  Yes [provider]  cinacalcet (SENSIPAR) 30 MG tablet Take 30 mg by mouth daily. 08/25/22  Yes [provider]  ELIQUIS 5 MG TABS tablet Take 5 mg by mouth 2 (two) times daily. 10/07/19  Yes [provider]  HYDROcodone-acetaminophen (NORCO/VICODIN) 5-325 MG tablet Take 1 tablet by mouth every 6 (six) hours as needed for severe pain. Patient taking differently: Take 1 tablet by mouth daily as needed for moderate pain. 06/26/22  Yes Tat, Shanon Brow, MD  LOKELMA 10 g PACK packet Take 10 g by mouth 2 (two) times a week. Tuesday and Thursday 06/08/22  Yes [provider]  Methoxy PEG-Epoetin Beta (MIRCERA IJ) Mircera 06/13/21 12/18/22 Yes [provider]  metoprolol tartrate (LOPRESSOR) 25 MG tablet Take 0.5 tablets (12.5 mg total) by mouth 2 (two) times daily. Patient taking differently: Take 25 mg by mouth daily. 06/26/22  Yes Tat, Shanon Brow, MD  midodrine (PROAMATINE) 5 MG tablet Take 10 mg by mouth 3 (three) times a week. Prior to dialysis M-W-F 08/13/22  Yes [provider]  omeprazole (PRILOSEC) 20 MG capsule Take 20 mg by mouth every morning.    Yes [provider]  pregabalin (LYRICA) 75 MG capsule Take 1 capsule (75 mg total) by mouth 2 (two) times daily. Patient taking differently: Take 75 mg by mouth daily. 06/26/22  Deveron Furlong, MD  VELPHORO 500 MG chewable tablet Chew 500 mg by mouth 2 (two) times daily with a meal. 06/03/22  Yes [provider]    Physical Exam: Vitals:   09/16/22 0500 09/16/22 0515 09/16/22 0530 09/16/22 0600  BP: 113/72 117/63 116/61 121/69  Pulse: 64  64 67  Resp: '17 18 15 18  '$ Temp:      TempSrc:      SpO2: 99%  99% 100%       Constitutional: Elderly male who appears to be in no acute distress Eyes: PERRL, lids and conjunctivae normal ENMT: Mucous membranes are moist.   Neck: normal, supple  Respiratory: Normal respiratory effort with mild expiratory wheeze appreciated on physical exam.  Currently on 2 L nasal cannula oxygen with O2 saturations maintained. Cardiovascular: Irregular irregular.  Positive 3/6 systolic murmur appreciated.  No extremity edema.  Fistula of the left upper extremity with palpable  thrill. Abdomen: no tenderness, no masses palpated. No hepatosplenomegaly. Bowel sounds positive.  Musculoskeletal: no clubbing / cyanosis. No joint deformity upper and lower extremities. Good ROM, no contractures. Normal muscle tone.  Skin: no rashes, lesions, ulcers. No induration Neurologic: CN 2-12 grossly intact.  Strength 5/5 in all 4.  Psychiatric: Normal judgment and insight. Alert and oriented x 3. Normal mood.   Data Reviewed:  EKG revealed a sinus rhythm at 72 bpm with first-degree heart block and QTc 561.  Reviewed labs, imaging, and pertinent records as noted above in HPI.  Assessment and Plan: Suspected lower GI bleed Anemia of chronic kidney disease Acute.  Patient presents back to the hospital with reports of bright red blood per rectum.  Had just recently been hospitalized for lower GI bleed from 2/29-3/2 and underwent EGD which noted 2 duodenal polyps which were removed and colonoscopy which showed 15 mm polyp in the cecum that was sessile (not removed), and a 10 mm polyp in the transverse colon with stigmata of recent bleeding which was removed with hot snare (removed), and radiation proctitis with clotted blood thought to be the source of bleeding treated with APC.  Patient has been typed and screened for possible need of blood products.  Hemoglobin noted to be 9.3.  He had been transfused 1 unit of packed red blood and hemoglobin had dropped down to 7.3 prior to discharge on 3/2.  Patient was recommended to be started on mesalamine suppositories. -Admit to a telemetry bed -Serial monitoring of H&H -Transfuse blood products as needed for hemoglobins less than 8 g/dL -Continue mesalamine suppository -Appreciate [facility] consultative services,  will follow-up for further recommendations  ESRD on HD Patient is on a Monday, Wednesday, and Friday hemodialysis schedule. -Continue current medication regimen -Check renal function panel daily -Nephrology consulted for need of  dialysis  Paroxysmal atrial fibrillation/flutter chronic anticoagulation History of wide-complex VT Patient appears to be in sinus rhythm at this time with heart rates controlled.. -Holding Eliquis due to GI bleed -Held amiodarone due to prolonged QTc  Hypotension Blood pressures were noted to be relatively soft with a low of 97/51. -Hold metoprolol and monitor  for when medically appropriate to resume -Continue midodrine  Prolonged QT interval Acute on chronic.  QTc 561. -Avoid QT prolonging medications -Correct electrolyte abnormalities -Recheck EKG in a.m.  Aortic valve stenosis  Patient with prior exam.  Last echocardiogram from 06/2022 noted moderate aortic stenosis with calcified peak and mean gradients through the valve noted to be 39 and 21 mmHg with dimensionless index 0.33.  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  Obesity BMI 31.79 kg/m  DVT prophylaxis: SCDs  Advance Care Planning:   Code Status: Full Code    Consults: GI  Family Communication: None requested  Severity of Illness: The appropriate patient status for this patient is OBSERVATION. Observation status is judged to be reasonable and necessary in order to provide the required intensity of service to ensure the patient's safety. The patient's presenting symptoms, physical exam findings, and initial radiographic and laboratory data in the context of their medical condition is felt to place them at decreased risk for further clinical deterioration. Furthermore, it is anticipated that the patient will be medically stable for discharge from the hospital within 2 midnights of admission.   Author: Norval Morton, MD 09/16/2022 7:14 AM  For on call review [email or web address].

## 2022-09-16 NOTE — ED Notes (Signed)
ED TO INPATIENT HANDOFF REPORT  ED Nurse Name and Phone #: Deloma Spindle  [phone]  S Name/Age/Gender Justin [name] 77 y.o. male Room/Bed: 040C/040C  Code Status   Code Status: Full Code  Home/SNF/Other Home Patient oriented to: self, place, time, and situation Is this baseline? Yes   Triage Complete: Triage complete  Chief Complaint Rectal bleeding [K62.5] GI bleed [K92.2]  Triage Note Pt c/o bright red blood rectal bleeding with clots. Pt was admitted last week for same and had a colonoscopy and EGD. Pt is a HD pt on MWF, last treatment was Monday.     Allergies No Known Allergies  Level of Care/Admitting Diagnosis ED Disposition     ED Disposition  Admit   Condition  --   Comment  Hospital Area: [facility] [[id number]]  Level of Care: Telemetry Medical [104]  May place patient in observation at [facility] or [facility] if equivalent level of care is available:: No  Covid Evaluation: Asymptomatic - no recent exposure (last 10 days) testing not required  Diagnosis: GI bleed [id number]  Admitting Physician: Norval Morton [id number]  Attending Physician: Norval Morton [id number]          B Medical/Surgery History Past Medical History:  Diagnosis Date   Acute respiratory failure with hypoxia and hypercapnia ([place]) 06/15/2022   Allergy, unspecified, initial encounter 10/10/2019   Anemia    Anemia in chronic kidney disease 11/20/2015   Cancer ([place]) 2009   Prostate; radiation seeds   Chronic kidney disease    stage 4   Diabetes mellitus    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 ([place]) 12/07/2019   Dialysis patient ([place])    Gout    Hypercalcemia 11/14/2021   Hyperkalemia 10/19/2018   Hypertension    Hypotension 06/13/2022   Right posterior capsular opacification 02/27/2021   Septic shock ([place])    Vitreous hemorrhage of right eye ([place]) 10/24/2019   Past Surgical History:   Procedure Laterality Date   A/V FISTULAGRAM Left 08/17/2017   Procedure: A/V FISTULAGRAM;  Surgeon: Katha Cabal, MD;  Location: [place] [facility];  Service: Cardiovascular;  Laterality: Left;   A/V SHUNT INTERVENTION N/A 08/17/2017   Procedure: A/V SHUNT INTERVENTION;  Surgeon: Katha Cabal, MD;  Location: [place] [facility];  Service: Cardiovascular;  Laterality: N/A;   AV FISTULA PLACEMENT Left 05/31/2015   Procedure: ARTERIOVENOUS (AV) FISTULA CREATION;  Surgeon: Katha Cabal, MD;  Location: [facility];  Service: Vascular;  Laterality: Left;   COLONOSCOPY WITH PROPOFOL N/A 09/11/2022   Procedure: COLONOSCOPY WITH PROPOFOL;  Surgeon: Sharyn Creamer, MD;  Location: [place];  Service: Gastroenterology;  Laterality: N/A;   ESOPHAGOGASTRODUODENOSCOPY (EGD) WITH PROPOFOL N/A 09/11/2022   Procedure: ESOPHAGOGASTRODUODENOSCOPY (EGD) WITH PROPOFOL;  Surgeon: Sharyn Creamer, MD;  Location: [facility];  Service: Gastroenterology;  Laterality: N/A;   excision bone spurs Bilateral 1989   feet   HEMOSTASIS CLIP PLACEMENT  09/11/2022   Procedure: HEMOSTASIS CLIP PLACEMENT;  Surgeon: Sharyn Creamer, MD;  Location: [facility] ENDOSCOPY;  Service: Gastroenterology;;   HOT HEMOSTASIS N/A 09/11/2022   Procedure: HOT HEMOSTASIS (ARGON PLASMA COAGULATION/BICAP);  Surgeon: Sharyn Creamer, MD;  Location: [facility];  Service: Gastroenterology;  Laterality: N/A;   INCISION AND DRAINAGE PERIRECTAL ABSCESS N/A 06/15/2022   Procedure: IRRIGATION AND DEBRIDEMENT PERIRECTAL ABSCESS;  Surgeon: Aviva Signs, MD;  Location: [facility];  Service: General;  Laterality: N/A;  KNEE SURGERY Left 1998   arthroscopy   POLYPECTOMY  09/11/2022   Procedure: POLYPECTOMY;  Surgeon: Sharyn Creamer, MD;  Location: [facility] ENDOSCOPY;  Service: Gastroenterology;;   SHOULDER SURGERY Left 1994   rotator cuff     A IV Location/Drains/Wounds Patient Lines/Drains/Airways Status     Active Line/Drains/Airways     Name Placement date  Placement time Site Days   Peripheral IV 09/16/22 20 G Anterior;Right Forearm 09/16/22  0400  Forearm  less than 1   Fistula / Graft Left Upper arm Arteriovenous fistula --  --  Upper arm  --   Wound / Incision (Open or Dehisced) 01/29/20 Non-pressure wound Back Lower abscess 01/29/20  2303  Back  961   Wound / Incision (Open or Dehisced) 06/13/22 Non-pressure wound Buttocks Left abscess to inside of buttock 06/13/22  0228  Buttocks  95            Intake/Output Last 24 hours No intake or output data in the 24 hours ending 09/16/22 1507  Labs/Imaging Results for orders placed or performed during the hospital encounter of 09/16/22 (from the past 48 hour(s))  Comprehensive metabolic panel     Status: Abnormal   Collection Time: 09/16/22  3:55 AM  Result Value Ref Range   Sodium 142 135 - 145 mmol/L   Potassium 3.5 3.5 - 5.1 mmol/L   Chloride 102 98 - 111 mmol/L   CO2 28 22 - 32 mmol/L   Glucose, Bld 147 (H) 70 - 99 mg/dL    Comment: Glucose reference range applies only to samples taken after fasting for at least 8 hours.   BUN 36 (H) 8 - 23 mg/dL   Creatinine, Ser 8.59 (H) 0.61 - 1.24 mg/dL   Calcium 8.4 (L) 8.9 - 10.3 mg/dL   Total Protein 7.7 6.5 - 8.1 g/dL   Albumin 3.4 (L) 3.5 - 5.0 g/dL   AST 25 15 - 41 U/L   ALT 32 0 - 44 U/L   Alkaline Phosphatase 156 (H) 38 - 126 U/L   Total Bilirubin 0.5 0.3 - 1.2 mg/dL   GFR, Estimated 6 (L) >60 mL/min    Comment: (NOTE) Calculated using the CKD-EPI Creatinine Equation (2021)    Anion gap 12 5 - 15    Comment: Performed at [facility], [place] [address], [place], [place] [address]  CBC with Diff     Status: Abnormal   Collection Time: 09/16/22  3:55 AM  Result Value Ref Range   WBC 9.4 4.0 - 10.5 K/uL   RBC 3.05 (L) 4.22 - 5.81 MIL/uL   Hemoglobin 9.3 (L) 13.0 - 17.0 g/dL   HCT 31.3 (L) 39.0 - 52.0 %   MCV 102.6 (H) 80.0 - 100.0 fL   MCH 30.5 26.0 - 34.0 pg   MCHC 29.7 (L) 30.0 - 36.0 g/dL   RDW 18.7 (H) 11.5 - 15.5 %    Platelets 211 150 - 400 K/uL   nRBC 0.2 0.0 - 0.2 %   Neutrophils Relative % 62 %   Neutro Abs 5.8 1.7 - 7.7 K/uL   Lymphocytes Relative 25 %   Lymphs Abs 2.3 0.7 - 4.0 K/uL   Monocytes Relative 10 %   Monocytes Absolute 0.9 0.1 - 1.0 K/uL   Eosinophils Relative 2 %   Eosinophils Absolute 0.2 0.0 - 0.5 K/uL   Basophils Relative 1 %   Basophils Absolute 0.1 0.0 - 0.1 K/uL   Immature Granulocytes 0 %  Abs Immature Granulocytes 0.04 0.00 - 0.07 K/uL    Comment: Performed at [facility], [place] [address] [place], [place] [address]  Type and screen [place]     Status: None   Collection Time: 09/16/22  3:55 AM  Result Value Ref Range   ABO/RH(D) O POS    Antibody Screen NEG    Sample Expiration      09/19/2022,2359 Performed at [facility], [place] [address] [place], [place] [address]   Hemoglobin and hematocrit, blood     Status: Abnormal   Collection Time: 09/16/22 12:40 PM  Result Value Ref Range   Hemoglobin 8.2 (L) 13.0 - 17.0 g/dL   HCT 27.5 (L) 39.0 - 52.0 %    Comment: Performed at [facility] [address] [place], [place] [address]   No results found.  Pending Labs Unresulted Labs (From admission, onward)     Start     Ordered   09/17/22 0500  CBC  Tomorrow morning,   R        09/16/22 0740   09/16/22 1000  Hemoglobin and hematocrit, blood  Now then every 6 hours,   R (with TIMED occurrences)      09/16/22 0632            Vitals/Pain Today's Vitals   09/16/22 1200 09/16/22 1240 09/16/22 1300 09/16/22 1500  BP: 132/68  124/60 120/66  Pulse: 61   60  Resp: 20  (!) 24 18  Temp: 98.2 F (36.8 C)   98.3 F (36.8 C)  TempSrc: Oral   Oral  SpO2: 99%   99%  PainSc: 0-No pain 0-No pain  0-No pain    Isolation Precautions No active isolations  Medications Medications  mesalamine (CANASA) suppository 1,000 mg (1,000 mg Rectal Given 09/16/22 0555)  HYDROcodone-acetaminophen (NORCO/VICODIN) 5-325 MG per tablet 1  tablet (has no administration in time range)  allopurinol (ZYLOPRIM) tablet 100 mg (100 mg Oral Given 09/16/22 0928)  atorvastatin (LIPITOR) tablet 40 mg (40 mg Oral Given 09/16/22 0928)  midodrine (PROAMATINE) tablet 10 mg (10 mg Oral Given 09/16/22 0830)  pantoprazole (PROTONIX) EC tablet 40 mg (40 mg Oral Given 09/16/22 0928)  sucroferric oxyhydroxide (VELPHORO) chewable tablet 500 mg (500 mg Oral Not Given 09/16/22 [id number])  cinacalcet (SENSIPAR) tablet 30 mg (30 mg Oral Given 09/16/22 0830)  sodium zirconium cyclosilicate (LOKELMA) packet 10 g (has no administration in time range)  pregabalin (LYRICA) capsule 75 mg (75 mg Oral Given 09/16/22 0932)  sodium chloride flush (NS) 0.9 % injection 3 mL (3 mLs Intravenous Given 09/16/22 0933)  acetaminophen (TYLENOL) tablet 650 mg (has no administration in time range)    Or  acetaminophen (TYLENOL) suppository 650 mg (has no administration in time range)  trimethobenzamide (TIGAN) injection 200 mg (has no administration in time range)  albuterol (PROVENTIL) (2.5 MG/3ML) 0.083% nebulizer solution 2.5 mg (has no administration in time range)  peg 3350 powder (MOVIPREP) kit 100 g (has no administration in time range)    And  peg 3350 powder (MOVIPREP) kit 100 g (has no administration in time range)    Mobility walks     Focused Assessments Renal Assessment Handoff:  Hemodialysis Schedule: Hemodialysis Schedule: Monday/Wednesday/Friday Last Hemodialysis date and time: monday   Restricted appendage: left arm  , .   R Recommendations: See Admitting Provider Note  Report given to:   Additional Notes: aox4,  flex sigmoid in am,  clear liquid, then  npo.

## 2022-09-16 NOTE — ED Triage Notes (Addendum)
Pt c/o bright red blood rectal bleeding with clots. Pt was admitted last week for same and had a colonoscopy and EGD. Pt is a HD pt on MWF, last treatment was Monday.

## 2022-09-16 NOTE — Consult Note (Signed)
Consultation Note   Referring Provider:  [facility] PCP: Michell Heinrich, DO Primary Gastroenterologist: Althia Forts ( saw Dr.Dorsey in hospital)        Reason for consultation: rectal bleeding   Hospital Day: 1  ASSESSMENT  # Painless hematochezia.   77 year old male with hemodynamically stable, painless hematochezia.  Bleeding possibly secondary to known radiation proctitis or post polypectomy bleed. Hgb is 9.3 in ED, up from 7.3 on 3-24  Upper and lower endoscopy 09/11/22 for rectal bleeding. 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 A 10 mm transverse colon polyp had stigmata of recent bleeding and therefore was removed with a hot snare followed by clip placement  # Gastric nodule / duodenal polyps on EGD earlier this month.  Not removed in setting of active GI bleed  # Atrial fibrillation, on Eliquis.   Last dose of Eliquis was around 730 last evening  # ESRD on HD  PLAN -Probable flexible sigmoidoscopy tomorrow. Last Eliquis dose was last evening. Ideally he would take a bowel prep since not ideal to use APC with stool in colon and his recent bowel prep was inadequate -Following repeat lower endoscopy if bleeding from radiation proctitis will treat with either topical mesalamine or carafate enemas.  -Eventual repeat colonoscopy with a 2-day prep so polyps can be removed.  -Eventual repeat EGD and / or EUS for evaluation of gastric nodule and duodenal polyps   HPI:  Patient is a 77 y.o. year old male with a past medical history of  hypertension, atrial fibrillation/flutter on Eliquis, ESRD on HD every MWF, chronic anemia, diabetes mellitus type 2 and prostate cancer s/p radiation seed implantation.    See PMH for any additional medical problems.  Patient came to ED this am for rectal bleeding. He was hospitalized for the same last week. He had an EGD and colonoscopy. See results below.  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  At 10 mm semipedunculated transverse colon had stigmata of recent bleeding and it was removed.   Rectal bleeding had stopped by the time of hospital discharge last week.  Yesterday he had an episode of recurrent, painless rectal bleeding.  Then this morning he woke up around 2 AM incontinent of a large amount of bright red blood. He had another episode of painless rectal bleeding on the way to the ED this morning  EGD 09/11/22 Normal esophagus, hiatal hernia, a single submucosal nodule in stomach, two duodenal polyps. No specimens collected.   Colonoscopy 09/11/22 Inadequate prep -15 mm sessile cecal polyp found but not removed.  - A 10 mm semi-pedunculated  polyp with stigmata of recent bleeding was found in transverse colon .  Since it was thought to have contributed to the rectal bleeding the polyp was removed by hot snare and clips were placed . Mucosal changes of radiation proctitis found and treated with APC.    Recent Imaging and Labs: DG Chest Portable 1 View  Result Date: 09/10/2022 CLINICAL DATA:  Provided history: Hypoxia. EXAM: PORTABLE CHEST 1 VIEW COMPARISON:  Prior chest radiographs 06/16/2022 and earlier. FINDINGS: Shallow inspiration radiograph, accentuating the cardiac silhouette and limiting evaluation of heart size. Aortic atherosclerosis. Bilateral perihilar and left lung base opacities, which may reflect atelectasis, edema and/or atypical/viral infection. No  evidence of pleural effusion or pneumothorax. No acute osseous abnormality identified. Chronic widening of the left acromioclavicular joint. A vascular stent projects in the left subclavian/axillary region. IMPRESSION: Shallow inspiration radiograph. Bilateral perihilar and left lung base opacities which may reflect atelectasis, edema and/or atypical/viral infection. Electronically Signed   By: Kellie Simmering D.O.   On: 09/10/2022 12:17   CT ANGIO GI BLEED  Result Date: 09/10/2022 CLINICAL DATA:  Rectal bleeding EXAM:  CTA ABDOMEN AND PELVIS WITHOUT AND WITH CONTRAST TECHNIQUE: Multidetector CT imaging of the abdomen and pelvis was performed using the standard protocol during bolus administration of intravenous contrast. Multiplanar reconstructed images and MIPs were obtained and reviewed to evaluate the vascular anatomy. RADIATION DOSE REDUCTION: This exam was performed according to the departmental dose-optimization program which includes automated exposure control, adjustment of the mA and/or kV according to patient size and/or use of iterative reconstruction technique. CONTRAST:  75 mL OMNIPAQUE IOHEXOL 350 MG/ML SOLN COMPARISON:  08/19/2022 FINDINGS: VASCULAR Aorta: Dense atheromatous calcifications. No aneurysm or dissection. Celiac: Atheromatous changes no evidence of occlusion. SMA: Atheromatous changes no evidence of occlusion. Renals: Atheromatous changes no evidence of occlusion. IMA: Atheromatous changes no evidence of occlusion. Inflow: Patent without evidence of aneurysm, dissection, vasculitis or significant stenosis. Proximal Outflow: Bilateral common femoral and visualized portions of the superficial and profunda femoral arteries are patent without evidence of aneurysm, dissection, vasculitis or significant stenosis. Veins: No obvious venous abnormality within the limitations of this arterial phase study. Review of the MIP images confirms the above findings. NON-VASCULAR Lower chest: Dependent basilar subsegmental atelectasis. No pleural or pericardial effusion identified. There is bilateral gynecomastia. Hepatobiliary: Distended gallbladder with layering milk calcium or calcified stones. No hepatic parenchymal abnormalities or biliary ductal dilatation identified. Pancreas: Unremarkable. No pancreatic ductal dilatation or surrounding inflammatory changes. Spleen: Normal in size without focal abnormality. Adrenals/Urinary Tract: No adrenal lesions. Kidneys are atrophic. There are numerous centimeter-sized cystic  lesions in both kidneys that do not need further imaging follow up. No hydronephrosis or nephrolithiasis identified. Urinary bladder empty. Stomach/Bowel: Stomach is within normal limits. Appendix appears normal. No evidence of bowel wall thickening, distention, or inflammatory changes. Delayed postcontrast images demonstrate no evidence of extravasation of contrast indicate the presence of active GI hemorrhage. Lymphatic: No suspicious adenopathy identified. Reproductive: Prostate brachytherapy seeds noted. Prostate appears mildly prominent. Other: Small abdominal wall periumbilical defect containing omental fat without evidence of incarceration Musculoskeletal: Diffuse osteoblastic changes with the differential including diffuse metastatic disease versus renal osteodystrophy. IMPRESSION: VASCULAR 1. Dense atheromatous changes noted for the aorta and its branches without evidence of significant occlusive changes, aneurysm or dissection. NON-VASCULAR 1. No evidence of extravasation of IV contrast to indicate the presence of active GI bleed. 2. Sequelae of end-stage renal disease. 3. Distended gallbladder with evidence of stones or sludge. 4. 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 5. Bilateral gynecomastia. Electronically Signed   By: Sammie Bench M.D.   On: 09/10/2022 11:20   CT Abdomen Pelvis W Contrast  Result Date: 08/19/2022 CLINICAL DATA:  Epigastric pain EXAM: CT ABDOMEN AND PELVIS WITH CONTRAST TECHNIQUE: Multidetector CT imaging of the abdomen and pelvis was performed using the standard protocol following bolus administration of intravenous contrast. RADIATION DOSE REDUCTION: This exam was performed according to the departmental dose-optimization program which includes automated exposure control, adjustment of the mA and/or kV according to patient size and/or use of iterative reconstruction technique. CONTRAST:  55m OMNIPAQUE IOHEXOL 350 MG/ML  SOLN COMPARISON:  06/12/2022 FINDINGS:  Lower chest: Linear areas of atelectasis in the lung bases. Coronary artery and aortic calcifications. Hepatobiliary: Small layering gallstones within the gallbladder. No focal hepatic abnormality. Pancreas: No focal abnormality or ductal dilatation. Spleen: No focal abnormality.  Normal size. Adrenals/Urinary Tract: Numerous small cysts in the kidneys bilaterally, unchanged since prior study. Kidneys are mildly atrophic. No hydronephrosis. Adrenal glands and urinary bladder unremarkable. Stomach/Bowel: Normal appendix. Moderate stool burden throughout the colon. Stomach, large and small bowel grossly unremarkable. Vascular/Lymphatic: Aortic atherosclerosis. No evidence of aneurysm or adenopathy. Reproductive: Prostate enlargement. Other: No free fluid or free air. Small umbilical hernia containing fat. Musculoskeletal: No acute bony abnormality. IMPRESSION: Cholelithiasis.  No CT evidence of acute cholecystitis. Bibasilar atelectasis. Moderate stool burden throughout the colon. Coronary artery disease, aortic atherosclerosis. Prostate enlargement. Electronically Signed   By: Rolm Baptise M.D.   On: 08/19/2022 02:24      Labs:  Recent Labs    09/16/22 0355  WBC 9.4  HGB 9.3*  HCT 31.3*  PLT 211   Recent Labs    09/16/22 0355  NA 142  K 3.5  CL 102  CO2 28  GLUCOSE 147*  BUN 36*  CREATININE 8.59*  CALCIUM 8.4*   Recent Labs    09/16/22 0355  PROT 7.7  ALBUMIN 3.4*  AST 25  ALT 32  ALKPHOS 156*  BILITOT 0.5   No results for input(s): "HEPBSAG", "HCVAB", "HEPAIGM", "HEPBIGM" in the last 72 hours. No results for input(s): "LABPROT", "INR" in the last 72 hours.  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 ([place]) 12/07/2019   Dialysis patient ([place])    Gout    Hypercalcemia 11/14/2021   Hyperkalemia 10/19/2018   Hypertension    Hypotension 06/13/2022   Right posterior capsular opacification 02/27/2021   Septic shock ([place])    Vitreous hemorrhage of right eye ([place]) 10/24/2019    Past Surgical History:  Procedure Laterality Date   A/V FISTULAGRAM Left 08/17/2017   Procedure: A/V FISTULAGRAM;  Surgeon: Katha Cabal, MD;  Location: [facility];  Service: Cardiovascular;  Laterality: Left;   A/V SHUNT INTERVENTION N/A 08/17/2017   Procedure: A/V SHUNT INTERVENTION;  Surgeon: Katha Cabal, MD;  Location: [facility];  Service: Cardiovascular;  Laterality: N/A;   AV FISTULA PLACEMENT Left 05/31/2015   Procedure: ARTERIOVENOUS (AV) FISTULA CREATION;  Surgeon: Katha Cabal, MD;  Location: [facility];  Service: Vascular;  Laterality: Left;   COLONOSCOPY WITH PROPOFOL N/A 09/11/2022   Procedure: COLONOSCOPY WITH PROPOFOL;  Surgeon: Sharyn Creamer, MD;  Location: [facility];  Service: Gastroenterology;  Laterality: N/A;   ESOPHAGOGASTRODUODENOSCOPY (EGD) WITH PROPOFOL N/A 09/11/2022   Procedure: ESOPHAGOGASTRODUODENOSCOPY (EGD) WITH PROPOFOL;  Surgeon: Sharyn Creamer, MD;  Location: [facility];  Service: Gastroenterology;  Laterality: N/A;   excision bone spurs Bilateral 1989   feet   HEMOSTASIS CLIP PLACEMENT  09/11/2022   Procedure: HEMOSTASIS CLIP PLACEMENT;  Surgeon: Sharyn Creamer, MD;  Location[facility];  Service: Gastroenterology;;   HOT HEMOSTASIS N/A 09/11/2022   Procedure: HOT HEMOSTASIS (ARGON PLASMA COAGULATION/BICAP);  Surgeon: Sharyn Creamer, MD;  Location: [facility];  Service: Gastroenterology;  Laterality: N/A;   INCISION AND DRAINAGE PERIRECTAL ABSCESS N/A 06/15/2022   Procedure: IRRIGATION AND  DEBRIDEMENT PERIRECTAL ABSCESS;  Surgeon: Aviva Signs, MD;  Location: [facility];  Service: General;  Laterality: N/A;   KNEE SURGERY Left 1998   arthroscopy   POLYPECTOMY  09/11/2022   Procedure: POLYPECTOMY;  Surgeon: Sharyn Creamer, MD;  [facility] END[facility];  Service: Gastroenterology;;   SHOULDER SURGERY Left 1994   rotator cuff    Family History  Problem Relation Age of Onset   Kidney disease Mother    Alcoholism Father    Alcoholism Brother     Prior to Admission medications   Medication Sig Start Date End Date Taking? Authorizing Provider  allopurinol (ZYLOPRIM) 100 MG tablet Take 100 mg by mouth daily. 09/13/21  Yes [provider]  amiodarone (PACERONE) 200 MG tablet Take 1 tablet (200 mg total) by mouth 2 (two) times daily. On 07/09/22, start amiodarone 200 mg once daily Patient taking differently: Take 200 mg by mouth daily. On 07/09/22, start amiodarone 200 mg once daily 06/26/22  Yes Tat, David, MD  atorvastatin (LIPITOR) 40 MG tablet Take 40 mg by mouth daily.  10/30/14  Yes [provider]  cinacalcet (SENSIPAR) 30 MG tablet Take 30 mg by mouth daily. 08/25/22  Yes [provider]  ELIQUIS 5 MG TABS tablet Take 5 mg by mouth 2 (two) times daily. 10/07/19  Yes [provider]  HYDROcodone-acetaminophen (NORCO/VICODIN) 5-325 MG tablet Take 1 tablet by mouth every 6 (six) hours as needed for severe pain. Patient taking differently: Take 1 tablet by mouth daily as needed for moderate pain. 06/26/22  Yes Tat, Shanon Brow, MD  LOKELMA 10 g PACK packet Take 10 g by mouth 2 (two) times a week. Tuesday and Thursday 06/08/22  Yes [provider]  Methoxy PEG-Epoetin Beta (MIRCERA IJ) Mircera 06/13/21 12/18/22 Yes [provider]  metoprolol tartrate (LOPRESSOR) 25 MG tablet Take 0.5 tablets (12.5 mg total) by mouth 2 (two) times daily. Patient taking differently: Take 25 mg by mouth daily. 06/26/22  Yes Tat, Shanon Brow, MD  midodrine (PROAMATINE) 5  MG tablet Take 10 mg by mouth 3 (three) times a week. Prior to dialysis M-W-F 08/13/22  Yes [provider]  omeprazole (PRILOSEC) 20 MG capsule Take 20 mg by mouth every morning.    Yes [provider]  pregabalin (LYRICA) 75 MG capsule Take 1 capsule (75 mg total) by mouth 2 (two) times daily. Patient taking differently: Take 75 mg by mouth daily. 06/26/22  Yes Tat, Shanon Brow, MD  VELPHORO 500 MG chewable tablet Chew 500 mg by mouth 2 (two) times daily with a meal. 06/03/22  Yes [provider]    Current Facility-Administered Medications  Medication Dose Route Frequency Provider Last Rate Last Admin   acetaminophen (TYLENOL) tablet 650 mg  650 mg Oral Q6H PRN Norval Morton, MD       Or   acetaminophen (TYLENOL) suppository 650 mg  650 mg Rectal Q6H PRN Fuller Plan A, MD       albuterol (PROVENTIL) (2.5 MG/3ML) 0.083% nebulizer solution 2.5 mg  2.5 mg Nebulization Q6H PRN Smith, Rondell A, MD       allopurinol (ZYLOPRIM) tablet 100 mg  100 mg Oral Daily Smith, Rondell A, MD       atorvastatin (LIPITOR) tablet 40 mg  40 mg Oral Daily Norval Morton, MD  cinacalcet (SENSIPAR) tablet 30 mg  30 mg Oral Q breakfast Fuller Plan A, MD   30 mg at 09/16/22 0830   HYDROcodone-acetaminophen (NORCO/VICODIN) 5-325 MG per tablet 1 tablet  1 tablet Oral Daily PRN Norval Morton, MD       mesalamine (CANASA) suppository 1,000 mg  1,000 mg Rectal BID Godfrey Pick, MD   1,000 mg at 09/16/22 0555   midodrine (PROAMATINE) tablet 10 mg  10 mg Oral Once per day on Mon Wed Fri Smith, Eustaquio Boyden A, MD   10 mg at 09/16/22 0830   pantoprazole (PROTONIX) EC tablet 40 mg  40 mg Oral Daily Smith, Rondell A, MD       pregabalin (LYRICA) capsule 75 mg  75 mg Oral Daily Smith, Rondell A, MD       sodium chloride flush (NS) 0.9 % injection 3 mL  3 mL Intravenous Q12H Norval Morton, MD       [START ON 09/17/2022] sodium zirconium cyclosilicate (LOKELMA) packet 10 g  10 g Oral Once per day  on Mon Thu Smith, Eustaquio Boyden A, MD       sucroferric oxyhydroxide [facility]) chewable tablet 500 mg  500 mg Oral BID WC Smith, Rondell A, MD       trimethobenzamide (TIGAN) injection 200 mg  200 mg Intramuscular Q6H PRN Norval Morton, MD       Current Outpatient Medications  Medication Sig Dispense Refill   allopurinol (ZYLOPRIM) 100 MG tablet Take 100 mg by mouth daily.     amiodarone (PACERONE) 200 MG tablet Take 1 tablet (200 mg total) by mouth 2 (two) times daily. On 07/09/22, start amiodarone 200 mg once daily (Patient taking differently: Take 200 mg by mouth daily. On 07/09/22, start amiodarone 200 mg once daily)     atorvastatin (LIPITOR) 40 MG tablet Take 40 mg by mouth daily.      cinacalcet (SENSIPAR) 30 MG tablet Take 30 mg by mouth daily.     ELIQUIS 5 MG TABS tablet Take 5 mg by mouth 2 (two) times daily.     HYDROcodone-acetaminophen (NORCO/VICODIN) 5-325 MG tablet Take 1 tablet by mouth every 6 (six) hours as needed for severe pain. (Patient taking differently: Take 1 tablet by mouth daily as needed for moderate pain.) 10 tablet 0   LOKELMA 10 g PACK packet Take 10 g by mouth 2 (two) times a week. Tuesday and Thursday     Methoxy PEG-Epoetin Beta (MIRCERA IJ) Mircera     metoprolol tartrate (LOPRESSOR) 25 MG tablet Take 0.5 tablets (12.5 mg total) by mouth 2 (two) times daily. (Patient taking differently: Take 25 mg by mouth daily.)     midodrine (PROAMATINE) 5 MG tablet Take 10 mg by mouth 3 (three) times a week. Prior to dialysis M-W-F     omeprazole (PRILOSEC) 20 MG capsule Take 20 mg by mouth every morning.      pregabalin (LYRICA) 75 MG capsule Take 1 capsule (75 mg total) by mouth 2 (two) times daily. (Patient taking differently: Take 75 mg by mouth daily.) 10 capsule 0   VELPHORO 500 MG chewable tablet Chew 500 mg by mouth 2 (two) times daily with a meal.      Allergies as of 09/16/2022   (No Known Allergies)    Social History   Socioeconomic History   Marital  status: Widowed    Spouse name: Not on file   Number of children: Not on file   Years of education: Not on  file   Highest education level: Not on file  Occupational History   Not on file  Tobacco Use   Smoking status: Former    Types: Cigarettes    Quit date: 10/01/1978    Years since quitting: 43.9   Smokeless tobacco: Never  Vaping Use   Vaping Use: Never used  Substance and Sexual Activity   Alcohol use: No   Drug use: No   Sexual activity: Not on file  Other Topics Concern   Not on file  Social History Narrative   Not on file   Social Determinants of Health   Financial Resource Strain: Not on file  Food Insecurity: No Food Insecurity (09/16/2022)   Hunger Vital Sign    Worried About Running Out of Food in the Last Year: Never true    Ran Out of Food in the Last Year: Never true  Transportation Needs: No Transportation Needs (09/16/2022)   PRAPARE - Hydrologist (Medical): No    Lack of Transportation (Non-Medical): No  Physical Activity: Not on file  Stress: Not on file  Social Connections: Not on file  Intimate Partner Violence: Not At Risk (09/16/2022)   Humiliation, Afraid, Rape, and Kick questionnaire    Fear of Current or Ex-Partner: No    Emotionally Abused: No    Physically Abused: No    Sexually Abused: No    Review of Systems: All systems reviewed and negative except where noted in HPI.  Physical Exam: Vital signs in last 24 hours: Temp:  [97.4 F (36.3 C)-97.6 F (36.4 C)] 97.6 F (36.4 C) (03/06 0830) Pulse Rate:  [64-100] 67 (03/06 0830) Resp:  [15-22] 19 (03/06 0830) BP: (106-121)/(54-72) 107/60 (03/06 0830) SpO2:  [90 %-100 %] 100 % (03/06 0830)    General:  Alert male in NAD Psych:  Pleasant, cooperative. Normal mood and affect Eyes: Pupils equal Ears:  Normal auditory acuity Nose: No deformity, discharge or lesions Neck:  Supple, no masses felt Lungs:  Clear to auscultation.  Heart:  Regular rate, regular  rhythm, murmur present  Abdomen:  Soft, nondistended, nontender, active bowel sounds, no masses felt Rectal :  very scant amount of watery bloody fluid in vault.  Msk: Symmetrical without gross deformities.  Neurologic:  Alert, oriented, grossly normal neurologically Extremities : No edema Skin:  Intact without significant lesions.    Intake/Output from previous day: No intake/output data recorded. Intake/Output this shift:  No intake/output data recorded.    Principal Problem:   Rectal bleeding Active Problems:   GI bleed    Tye Savoy, NP-C @  09/16/2022, 9:21 AM

## 2022-09-16 NOTE — Progress Notes (Signed)
Responded to spiritual Care consult to visit with patient.  Pt. Requested prayer and wanted to talk.  Spent time with patient.  Provided listening,emotional and  Spiritual Support. Chaplain available as needed.  Jaclynn Major, [place], [facility], Pager [phone]

## 2022-09-16 NOTE — Consult Note (Signed)
Renal Service Consult Note [facility]  Justin [name] 09/16/2022 Justin Blazing, MD Requesting Physician: Dr. Harvest Forest  Reason for Consult: ESRD pt w/ GI bleed HPI: The patient is a 77 y.o. year-old w/ PMH as below who presented to ED this am w/ BRBPR. Hx prostate cancer in 2009 rx'd w/ radiation. Pt was just released on 3/2 after admit for GI bleed w/u. Is on eliquis. In ED Hb 9.3, K 3.5 BUN 36. Creat 8.5. Has not missed HD, last HD was Monday 3/4. Pt admitted and we are asked to see for dialysis.   Pt seen in room, no c/o's. States he "never has lots of fluid on".     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)    Vitreous hemorrhage of right eye ([place]) 10/24/2019   Past Surgical History  Past Surgical History:  Procedure Laterality Date   A/V FISTULAGRAM Left 08/17/2017   Procedure: A/V FISTULAGRAM;  Surgeon: Katha Cabal, MD;  Location: [place] [facility];  Service: Cardiovascular;  Laterality: Left;   A/V SHUNT INTERVENTION N/A 08/17/2017   Procedure: A/V SHUNT INTERVENTION;  Surgeon: Katha Cabal, MD;  Location: [place] [facility];  Service: Cardiovascular;  Laterality: N/A;   AV  FISTULA PLACEMENT Left 05/31/2015   Procedure: ARTERIOVENOUS (AV) FISTULA CREATION;  Surgeon: Katha Cabal, MD;  Location: [facility];  Service: Vascular;  Laterality: Left;   COLONOSCOPY WITH PROPOFOL N/A 09/11/2022   Procedure: COLONOSCOPY WITH PROPOFOL;  Surgeon: Sharyn Creamer, MD;  Location: [place];  Service: Gastroenterology;  Laterality: N/A;   ESOPHAGOGASTRODUODENOSCOPY (EGD) WITH PROPOFOL N/A 09/11/2022   Procedure: ESOPHAGOGASTRODUODENOSCOPY (EGD) WITH PROPOFOL;  Surgeon: Sharyn Creamer, MD;  Location: [place];  Service: Gastroenterology;  Laterality: N/A;   excision bone spurs Bilateral 1989   feet   HEMOSTASIS CLIP PLACEMENT  09/11/2022   Procedure: HEMOSTASIS CLIP PLACEMENT;  Surgeon: Sharyn Creamer, MD;  Location: [facility];  Service: Gastroenterology;;   HOT HEMOSTASIS N/A 09/11/2022   Procedure: HOT HEMOSTASIS (ARGON PLASMA COAGULATION/BICAP);  Surgeon: Sharyn Creamer, MD;  Location: [place];  Service: Gastroenterology;  Laterality: N/A;   INCISION AND DRAINAGE PERIRECTAL ABSCESS N/A 06/15/2022   Procedure: IRRIGATION AND DEBRIDEMENT PERIRECTAL ABSCESS;  Surgeon: Aviva Signs, MD;  Location: [facility];  Service: General;  Laterality: N/A;   KNEE SURGERY Left 1998   arthroscopy   POLYPECTOMY  09/11/2022   Procedure: POLYPECTOMY;  Surgeon: Sharyn Creamer, MD;  Location: [facility] ENDOSCOPY;  Service: Gastroenterology;;   SHOULDER SURGERY Left 1994   rotator cuff   Family History  Family History  Problem Relation Age of Onset   Kidney disease  Mother    Alcoholism Father    Alcoholism Brother    Social History  reports that he quit smoking about 43 years ago. His smoking use included cigarettes. He has never used smokeless tobacco. He reports that he does not drink alcohol and does not use drugs. Allergies No Known Allergies Home medications Prior to Admission medications   Medication Sig Start Date End Date Taking? Authorizing Provider  allopurinol (ZYLOPRIM) 100 MG  tablet Take 100 mg by mouth daily. 09/13/21  Yes [provider]  amiodarone (PACERONE) 200 MG tablet Take 1 tablet (200 mg total) by mouth 2 (two) times daily. On 07/09/22, start amiodarone 200 mg once daily Patient taking differently: Take 200 mg by mouth daily. On 07/09/22, start amiodarone 200 mg once daily 06/26/22  Yes Tat, David, MD  atorvastatin (LIPITOR) 40 MG tablet Take 40 mg by mouth daily.  10/30/14  Yes [provider]  cinacalcet (SENSIPAR) 30 MG tablet Take 30 mg by mouth daily. 08/25/22  Yes [provider]  ELIQUIS 5 MG TABS tablet Take 5 mg by mouth 2 (two) times daily. 10/07/19  Yes [provider]  HYDROcodone-acetaminophen (NORCO/VICODIN) 5-325 MG tablet Take 1 tablet by mouth every 6 (six) hours as needed for severe pain. Patient taking differently: Take 1 tablet by mouth daily as needed for moderate pain. 06/26/22  Yes Tat, Shanon Brow, MD  LOKELMA 10 g PACK packet Take 10 g by mouth 2 (two) times a week. Tuesday and Thursday 06/08/22  Yes [provider]  Methoxy PEG-Epoetin Beta (MIRCERA IJ) Mircera 06/13/21 12/18/22 Yes [provider]  metoprolol tartrate (LOPRESSOR) 25 MG tablet Take 0.5 tablets (12.5 mg total) by mouth 2 (two) times daily. Patient taking differently: Take 25 mg by mouth daily. 06/26/22  Yes Tat, Shanon Brow, MD  midodrine (PROAMATINE) 5 MG tablet Take 10 mg by mouth 3 (three) times a week. Prior to dialysis M-W-F 08/13/22  Yes [provider]  omeprazole (PRILOSEC) 20 MG capsule Take 20 mg by mouth every morning.    Yes [provider]  pregabalin (LYRICA) 75 MG capsule Take 1 capsule (75 mg total) by mouth 2 (two) times daily. Patient taking differently: Take 75 mg by mouth daily. 06/26/22  Yes Tat, Shanon Brow, MD  VELPHORO 500 MG chewable tablet Chew 500 mg by mouth 2 (two) times daily with a meal. 06/03/22  Yes [provider]     Vitals:   09/16/22 0930 09/16/22 1100 09/16/22 1200 09/16/22  1300  BP: 120/70 133/63 132/68 124/60  Pulse:   61   Resp: (!) '21 19 20 '$ (!) 24  Temp:   98.2 F (36.8 C)   TempSrc:   Oral   SpO2:   99%    Exam Gen alert, no distress No rash, cyanosis or gangrene Sclera anicteric, throat clear  No jvd or bruits Chest clear bilat to bases, no rales/ wheezing RRR no MRG Abd soft ntnd no mass or ascites +bs GU normal male MS no joint effusions or deformity Ext no LE or UE edema, no wounds or ulcers Neuro is alert, Ox 3 , nf    LUA AVF+bruit    Home meds include - lopressor, zyloprim, amiodarone, lipitor, sensipar 30 qd, norco prn, eliquis bid, loklema, midodrine '10mg'$  pre hd mwf, prilosec, lurica, velphoro '500mg'$  po ac tid, prns/ vits/ supps     [facility] HD: [place] M[facility] 3h 57mn   450/ 600  100kg  2/2.5 bath  AVF  Hep none - last  HD 3/4, post wt 100.6kg, dry wt lowered to 110.5 on 3/4 - rocaltrol 0.25 mcg po tiw - venofer '100mg'$  tiw IV until 3/06 - mircera 150 mcg IV q 2wks, last 2/28, due 3/13   Assessment/ Plan: GI bleed - w/ BRBPR. Hb 7- 9 here. Per pmd/ GI.  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 Has not missed HD. Plan HD today or tonight.  HTN/ volume - dry wt just lowered on 3/04. Not grossly overloaded today. UF goal 1-2 L w/ HD.  Anemia esrd - Hb low, next esa due in 1 week. Cont IV Fe load thru 3/06 which is today. Follow hb. Tx prn.  MBD ckd - CCa and phos are in range. Cont po vdra amd velphoro as binder.  Atrial fib - on eliquis and BB      Rob Serrita Lueth  MD [facility] 09/16/2022, 3:00 PM  Recent Labs  Lab 09/12/22 0444 09/16/22 0355 09/16/22 1240  HGB 7.3* 9.3* 8.2*  ALBUMIN 3.4* 3.4*  --   CALCIUM 8.2* 8.4*  --   PHOS 2.8  --   --   CREATININE 5.03* 8.59*  --   K 3.2* 3.5  --    Inpatient medications:  allopurinol  100 mg Oral Daily   atorvastatin  40 mg Oral Daily   cinacalcet  30 mg Oral Q breakfast   mesalamine  1,000 mg Rectal BID   midodrine  10 mg Oral Once per day on Mon Wed Fri   pantoprazole  40 mg Oral Daily   peg 3350 powder  0.5 kit  Oral Once   And   peg 3350 powder  0.5 kit Oral Once   pregabalin  75 mg Oral Daily   sodium chloride flush  3 mL Intravenous Q12H   [START ON 09/17/2022] sodium zirconium cyclosilicate  10 g Oral Once per day on Mon Thu   sucroferric oxyhydroxide  500 mg Oral BID WC    acetaminophen **OR** acetaminophen, albuterol, HYDROcodone-acetaminophen, trimethobenzamide

## 2022-09-16 NOTE — Discharge Planning (Signed)
Transition of Care [facility]) - Emergency Department Mini Assessment   Patient Details  Name: Justin [name] MRN: [id number] Date of Birth: 1946/03/06  Transition of Care [facility]) CM/SW Contact:    Fuller Mandril, RN Phone Number: 09/16/2022, 8:37 AM   Clinical Narrative:  Pt c/o bright red blood rectal bleeding with clots. Pt was admitted last week for same and had a colonoscopy and EGD. Pt is a HD pt on MWF, last treatment was Monday.     ED Mini Assessment: What brought you to the Emergency Department? : Rectal bleeding  Barriers to Discharge: Continued Medical Work up             Patient Contact and Communications        ,          Patient states their goals for this hospitalization and ongoing recovery are:: Get back home      Admission diagnosis:  Rectal bleeding [K62.5] GI bleed [K92.2] Patient Active Problem List   Diagnosis Date Noted   Rectal bleeding 09/16/2022   GI bleed 09/16/2022   Lower GI bleeding 09/12/2022   Acute GI bleeding 09/10/2022   Wide-complex tachycardia 06/17/2022   Obesity (BMI 30-39.9) 06/13/2022   Perirectal abscess 06/13/2022   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   Murmur, cardiac 01/27/2022   Chronic back pain 01/27/2022   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[place]) 01/27/2022   GERD (gastroesophageal reflux disease) 11/11/2021   High risk medication use 11/11/2021   History of cardioversion 11/11/2021   PVC (premature ventricular contraction) 11/11/2021   Nonrheumatic aortic valve stenosis 11/06/2021   Mixed hyperlipidemia 11/06/2021   Abdominal mass 05/05/2021   First degree AV block 07/13/2020   Paroxysmal A-fib ([place]) 01/29/2020   Wound infection 01/28/2020   H/O vitrectomy 12/07/2019   Macular pucker, 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 determined by examination associated with type 2 diabetes mellitus ([place]) 10/24/2019   Stable treated proliferative  diabetic retinopathy of left eye determined by examination associated with type 2 diabetes mellitus ([place]) 10/24/2019   ESRD on dialysis ([place]) 07/29/2017   Anticoagulated by anticoagulation treatment 07/13/2017   Malignant neoplasm of prostate ([place]) 07/12/2017   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

## 2022-09-16 NOTE — Progress Notes (Signed)
   09/16/22 2103  Spiritual Encounters  Type of Visit Follow up  OnCall Visit Yes   On call chap received call for RN on floor that pt desired to discuss ACD.  Chap informed RN that he would pass information along to day shift to come during the day.

## 2022-09-16 NOTE — ED Provider Notes (Signed)
[facility] Provider Note   CSN: [id number] Arrival date & time: 09/16/22  [id number]     History  Chief Complaint  Patient presents with   Rectal Bleeding    Justin [name] is a 77 y.o. male.   Rectal Bleeding Patient presents for rectal bleeding.  Medical history includes DM, HTN, ESRD, atrial fibrillation, GERD, HLD, anemia, GI bleed.  He undergoes hemodialysis on M, W, F.  Last session was on Monday.  Patient was admitted to the hospital 1 week ago for rectal bleeding.  He underwent EGD and colonoscopy which were notable for polyps.  Polyp in transverse colon had stigmata of recent bleeding.  This was removed with a hot snare.  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  During his hospitalization, his bleeding had resolved.  GI recommended resumption of anticoagulation.  Hemoglobin nadir was 7.3.  He received 1 unit of PRBCs while hospitalized.  He is on Eliquis.  Shortly prior to arrival, patient had recurrence of painless rectal bleeding.  Blood is described as bright red in color.  It occurs without bowel movements.  Patient denies any dizziness, lightheadedness, abdominal pain.     Home Medications Prior to Admission medications   Medication Sig Start Date End Date Taking? Authorizing Provider  allopurinol (ZYLOPRIM) 100 MG tablet Take 100 mg by mouth daily. 09/13/21  Yes [provider]  amiodarone (PACERONE) 200 MG tablet Take 1 tablet (200 mg total) by mouth 2 (two) times daily. On 07/09/22, start amiodarone 200 mg once daily Patient taking differently: Take 200 mg by mouth daily. On 07/09/22, start amiodarone 200 mg once daily 06/26/22  Yes Tat, David, MD  atorvastatin (LIPITOR) 40 MG tablet Take 40 mg by mouth daily.  10/30/14  Yes [provider]  cinacalcet (SENSIPAR) 30 MG tablet Take 30 mg by mouth daily. 08/25/22  Yes [provider]  ELIQUIS 5 MG TABS tablet Take 5 mg by mouth 2 (two) times  daily. 10/07/19  Yes [provider]  HYDROcodone-acetaminophen (NORCO/VICODIN) 5-325 MG tablet Take 1 tablet by mouth every 6 (six) hours as needed for severe pain. Patient taking differently: Take 1 tablet by mouth daily as needed for moderate pain. 06/26/22  Yes Tat, Shanon Brow, MD  LOKELMA 10 g PACK packet Take 10 g by mouth 2 (two) times a week. Tuesday and Thursday 06/08/22  Yes [provider]  Methoxy PEG-Epoetin Beta (MIRCERA IJ) Mircera 06/13/21 12/18/22 Yes [provider]  metoprolol tartrate (LOPRESSOR) 25 MG tablet Take 0.5 tablets (12.5 mg total) by mouth 2 (two) times daily. Patient taking differently: Take 25 mg by mouth daily. 06/26/22  Yes Tat, Shanon Brow, MD  midodrine (PROAMATINE) 5 MG tablet Take 10 mg by mouth 3 (three) times a week. Prior to dialysis M-W-F 08/13/22  Yes [provider]  omeprazole (PRILOSEC) 20 MG capsule Take 20 mg by mouth every morning.    Yes [provider]  pregabalin (LYRICA) 75 MG capsule Take 1 capsule (75 mg total) by mouth 2 (two) times daily. Patient taking differently: Take 75 mg by mouth daily. 06/26/22  Yes Tat, Shanon Brow, MD  VELPHORO 500 MG chewable tablet Chew 500 mg by mouth 2 (two) times daily with a meal. 06/03/22  Yes [provider]      Allergies    Patient has no known allergies.    Review of Systems   Review of Systems  Gastrointestinal:  Positive for anal bleeding and  hematochezia.  All other systems reviewed and are negative.   Physical Exam Updated Vital Signs BP 116/61   Pulse 64   Temp (!) 97.4 F (36.3 C) (Oral)   Resp 15   SpO2 99%  Physical Exam Vitals and nursing note reviewed. Exam conducted with a chaperone present.  Constitutional:      General: He is not in acute distress.    Appearance: Normal appearance. He is well-developed. He is not ill-appearing, toxic-appearing or diaphoretic.  HENT:     Head: Normocephalic and atraumatic.     Right Ear: External ear normal.      Left Ear: External ear normal.     Nose: Nose normal.     Mouth/Throat:     Mouth: Mucous membranes are moist.  Eyes:     Conjunctiva/sclera: Conjunctivae normal.  Cardiovascular:     Rate and Rhythm: Normal rate and regular rhythm.  Pulmonary:     Effort: Pulmonary effort is normal. No respiratory distress.  Abdominal:     General: There is no distension.     Palpations: Abdomen is soft.     Tenderness: There is no abdominal tenderness.  Genitourinary:    Rectum: No mass, anal fissure, external hemorrhoid or internal hemorrhoid.     Comments: Gross blood present on DRE, maroon in color Musculoskeletal:        General: No swelling. Normal range of motion.     Cervical back: Normal range of motion and neck supple.  Skin:    General: Skin is warm and dry.     Coloration: Skin is not pale.  Neurological:     General: No focal deficit present.     Mental Status: He is alert and oriented to person, place, and time.  Psychiatric:        Mood and Affect: Mood normal.        Behavior: Behavior normal.     ED Results / Procedures / Treatments   Labs (all labs ordered are listed, but only abnormal results are displayed) Labs Reviewed  COMPREHENSIVE METABOLIC PANEL - Abnormal; Notable for the following components:      Result Value   Glucose, Bld 147 (*)    BUN 36 (*)    Creatinine, Ser 8.59 (*)    Calcium 8.4 (*)    Albumin 3.4 (*)    Alkaline Phosphatase 156 (*)    GFR, Estimated 6 (*)    All other components within normal limits  CBC WITH DIFFERENTIAL/PLATELET - Abnormal; Notable for the following components:   RBC 3.05 (*)    Hemoglobin 9.3 (*)    HCT 31.3 (*)    MCV 102.6 (*)    MCHC 29.7 (*)    RDW 18.7 (*)    All other components within normal limits  TYPE AND SCREEN    EKG EKG Interpretation  Date/Time:  Wednesday September 16 2022 03:47:24 EST Ventricular Rate:  72 PR Interval:  265 QRS Duration: 101 QT Interval:  512 QTC Calculation: 561 R  Axis:   85 Text Interpretation: Sinus or ectopic atrial rhythm Prolonged PR interval Borderline right axis deviation Nonspecific T abnormalities, diffuse leads Prolonged QT interval Confirmed by Godfrey Pick ([id number]) on 09/16/2022 4:04:06 AM  Radiology No results found.  Procedures Procedures    Medications Ordered in ED Medications  mesalamine (CANASA) suppository 1,000 mg (has no administration in time range)    ED Course/ Medical Decision Making/ A&P  Medical Decision Making Amount and/or Complexity of Data Reviewed Labs: ordered.  Risk Prescription drug management.   This patient presents to the ED for concern of rectal bleeding, this involves an extensive number of treatment options, and is a complaint that carries with it a high risk of complications and morbidity.  The differential diagnosis includes recurrence of bleeding from radiation proctitis, postprocedural bleeding from recent colonoscopy, hemorrhoidal bleed, diverticular bleed, UGIB with rapid transit   Co morbidities that complicate the patient evaluation  DM, HTN, ESRD, atrial fibrillation, GERD, HLD, anemia, GI bleed   Additional history obtained:  Additional history obtained from N/A External records from outside source obtained and reviewed including EMR   Lab Tests:  I Ordered, and personally interpreted labs.  The pertinent results include: No acute drop in hemoglobin, no leukocytosis, elevated creatinine and BUN consistent with ESRD, normal electrolytes   Consultations Obtained:  I requested consultation with the gastroenterologist, Dr. Silverio Decamp,  and discussed lab and imaging findings as well as pertinent plan - they recommend: Hold Eliquis, start mesalamine suppositories, admission for observation.   Problem List / ED Course / Critical interventions / Medication management  Patient presents for painless rectal bleeding.  He had a recent hospitalization for the same.   During hospitalization, he received 1 unit PRBCs.  He underwent colonoscopy.  He has resumed taking his Eliquis.  Last dose of Eliquis was last night.  On arrival in the ED, blood pressures are low-normal.  Patient is overall well-appearing on exam.  He denies any near syncopal symptoms.  DRE was performed with nurse chaperone present.  There was gross blood that was maroon in color.  Laboratory workup was initiated.  On CBC, patient has not had an acute drop in hemoglobin.  I spoke with gastroenterologist on-call, Dr. Silverio Decamp, who recommends holding Eliquis and initiation of mesalamine suppositories.  She does not feel that, at this point, patient requires repeat colonoscopy.  Patient was admitted to hospitalist for further observation. I ordered medication including mesalamine for radiation proctitis Reevaluation of the patient after these medicines showed that the patient stayed the same I have reviewed the patients home medicines and have made adjustments as needed   Social Determinants of Health:  Lives at home with family, has access to outpatient care         Final Clinical Impression(s) / ED Diagnoses Final diagnoses:  Rectal bleeding    Rx / DC Orders ED Discharge Orders     None         Godfrey Pick, MD 09/16/22 [phone]

## 2022-09-16 NOTE — Progress Notes (Signed)
Received patient in bed to unit.  Alert and oriented.  Informed consent signed and in chart.   [place] duration:3.5  Patient tolerated well.  Transported back to the room  Alert, without acute distress.  Hand-off given to patient's nurse.   Access used: AVF Access issues: none  Total UF removed: 800 ml Medication(s) given: Ferric Gluconate '125mg'$  IV  Post HD VS: 122/72 P 64. R 20. O2 sat 100 % in 1 L O2 using. Post HD weight:100 kg   [facility]

## 2022-09-17 ENCOUNTER — Observation Stay (HOSPITAL_COMMUNITY): Payer: Medicare Other | Admitting: Anesthesiology

## 2022-09-17 ENCOUNTER — Encounter (HOSPITAL_COMMUNITY): Payer: Self-pay | Admitting: Internal Medicine

## 2022-09-17 ENCOUNTER — Encounter (HOSPITAL_COMMUNITY)
Admission: EM | Disposition: A | Discharge status: Home / Self Care | Payer: Self-pay | Source: Home / Self Care | Attending: Internal Medicine

## 2022-09-17 DIAGNOSIS — K627 Radiation proctitis: Secondary | ICD-10-CM | POA: Diagnosis present

## 2022-09-17 DIAGNOSIS — M1990 Unspecified osteoarthritis, unspecified site: Secondary | ICD-10-CM | POA: Diagnosis not present

## 2022-09-17 DIAGNOSIS — Z87891 Personal history of nicotine dependence: Secondary | ICD-10-CM | POA: Diagnosis not present

## 2022-09-17 DIAGNOSIS — E669 Obesity, unspecified: Secondary | ICD-10-CM | POA: Diagnosis present

## 2022-09-17 DIAGNOSIS — I4892 Unspecified atrial flutter: Secondary | ICD-10-CM | POA: Diagnosis present

## 2022-09-17 DIAGNOSIS — D120 Benign neoplasm of cecum: Secondary | ICD-10-CM

## 2022-09-17 DIAGNOSIS — D126 Benign neoplasm of colon, unspecified: Secondary | ICD-10-CM | POA: Diagnosis not present

## 2022-09-17 DIAGNOSIS — Z923 Personal history of irradiation: Secondary | ICD-10-CM | POA: Diagnosis not present

## 2022-09-17 DIAGNOSIS — Z8546 Personal history of malignant neoplasm of prostate: Secondary | ICD-10-CM | POA: Diagnosis not present

## 2022-09-17 DIAGNOSIS — Z992 Dependence on renal dialysis: Secondary | ICD-10-CM | POA: Diagnosis not present

## 2022-09-17 DIAGNOSIS — D127 Benign neoplasm of rectosigmoid junction: Secondary | ICD-10-CM

## 2022-09-17 DIAGNOSIS — I959 Hypotension, unspecified: Secondary | ICD-10-CM | POA: Diagnosis present

## 2022-09-17 DIAGNOSIS — N186 End stage renal disease: Secondary | ICD-10-CM | POA: Diagnosis present

## 2022-09-17 DIAGNOSIS — Z86010 Personal history of colonic polyps: Secondary | ICD-10-CM | POA: Diagnosis not present

## 2022-09-17 DIAGNOSIS — E782 Mixed hyperlipidemia: Secondary | ICD-10-CM | POA: Diagnosis present

## 2022-09-17 DIAGNOSIS — K633 Ulcer of intestine: Secondary | ICD-10-CM | POA: Diagnosis present

## 2022-09-17 DIAGNOSIS — K635 Polyp of colon: Secondary | ICD-10-CM | POA: Diagnosis present

## 2022-09-17 DIAGNOSIS — K5730 Diverticulosis of large intestine without perforation or abscess without bleeding: Secondary | ICD-10-CM | POA: Diagnosis present

## 2022-09-17 DIAGNOSIS — Z7901 Long term (current) use of anticoagulants: Secondary | ICD-10-CM | POA: Diagnosis not present

## 2022-09-17 DIAGNOSIS — K219 Gastro-esophageal reflux disease without esophagitis: Secondary | ICD-10-CM | POA: Diagnosis present

## 2022-09-17 DIAGNOSIS — D631 Anemia in chronic kidney disease: Secondary | ICD-10-CM | POA: Diagnosis present

## 2022-09-17 DIAGNOSIS — E875 Hyperkalemia: Secondary | ICD-10-CM | POA: Diagnosis present

## 2022-09-17 DIAGNOSIS — D630 Anemia in neoplastic disease: Secondary | ICD-10-CM | POA: Diagnosis not present

## 2022-09-17 DIAGNOSIS — D123 Benign neoplasm of transverse colon: Secondary | ICD-10-CM

## 2022-09-17 DIAGNOSIS — I480 Paroxysmal atrial fibrillation: Secondary | ICD-10-CM | POA: Diagnosis present

## 2022-09-17 DIAGNOSIS — K922 Gastrointestinal hemorrhage, unspecified: Secondary | ICD-10-CM | POA: Diagnosis not present

## 2022-09-17 DIAGNOSIS — K5521 Angiodysplasia of colon with hemorrhage: Secondary | ICD-10-CM

## 2022-09-17 DIAGNOSIS — K625 Hemorrhage of anus and rectum: Secondary | ICD-10-CM | POA: Diagnosis present

## 2022-09-17 DIAGNOSIS — D62 Acute posthemorrhagic anemia: Secondary | ICD-10-CM | POA: Diagnosis present

## 2022-09-17 DIAGNOSIS — I350 Nonrheumatic aortic (valve) stenosis: Secondary | ICD-10-CM | POA: Diagnosis present

## 2022-09-17 DIAGNOSIS — Y842 Radiological procedure and radiotherapy as the cause of abnormal reaction of the patient, or of later complication, without mention of misadventure at the time of the procedure: Secondary | ICD-10-CM | POA: Diagnosis present

## 2022-09-17 DIAGNOSIS — I120 Hypertensive chronic kidney disease with stage 5 chronic kidney disease or end stage renal disease: Secondary | ICD-10-CM | POA: Diagnosis present

## 2022-09-17 DIAGNOSIS — E1122 Type 2 diabetes mellitus with diabetic chronic kidney disease: Secondary | ICD-10-CM | POA: Diagnosis present

## 2022-09-17 DIAGNOSIS — E11311 Type 2 diabetes mellitus with unspecified diabetic retinopathy with macular edema: Secondary | ICD-10-CM | POA: Diagnosis present

## 2022-09-17 HISTORY — PX: HOT HEMOSTASIS: SHX5433

## 2022-09-17 HISTORY — PX: HEMOSTASIS CLIP PLACEMENT: SHX6857

## 2022-09-17 HISTORY — PX: COLONOSCOPY WITH PROPOFOL: SHX5780

## 2022-09-17 LAB — CBC
HCT: 27.3 % — ABNORMAL LOW (ref 39.0–52.0)
Hemoglobin: 8.4 g/dL — ABNORMAL LOW (ref 13.0–17.0)
MCH: 31.3 pg (ref 26.0–34.0)
MCHC: 30.8 g/dL (ref 30.0–36.0)
MCV: 101.9 fL — ABNORMAL HIGH (ref 80.0–100.0)
Platelets: 167 10*3/uL (ref 150–400)
RBC: 2.68 MIL/uL — ABNORMAL LOW (ref 4.22–5.81)
RDW: 18.1 % — ABNORMAL HIGH (ref 11.5–15.5)
WBC: 8 10*3/uL (ref 4.0–10.5)
nRBC: 0 % (ref 0.0–0.2)

## 2022-09-17 LAB — MRSA NEXT GEN BY PCR, NASAL: MRSA by PCR Next Gen: NOT DETECTED

## 2022-09-17 LAB — HEPATITIS B SURFACE ANTIGEN: Hepatitis B Surface Ag: NONREACTIVE

## 2022-09-17 SURGERY — COLONOSCOPY WITH PROPOFOL
Anesthesia: Monitor Anesthesia Care

## 2022-09-17 MED ORDER — FLEET ENEMA 7-19 GM/118ML RE ENEM
1.0000 | ENEMA | Freq: Once | RECTAL | Status: AC
  Administered 2022-09-17: 1 via RECTAL
  Filled 2022-09-17: qty 1

## 2022-09-17 MED ORDER — PROPOFOL 10 MG/ML IV BOLUS
INTRAVENOUS | Status: DC | PRN
  Administered 2022-09-17: 70 mg via INTRAVENOUS

## 2022-09-17 MED ORDER — LIDOCAINE HCL (CARDIAC) PF 100 MG/5ML IV SOSY
PREFILLED_SYRINGE | INTRAVENOUS | Status: DC | PRN
  Administered 2022-09-17: 50 mg via INTRATRACHEAL

## 2022-09-17 MED ORDER — AMIODARONE HCL 200 MG PO TABS
200.0000 mg | ORAL_TABLET | Freq: Every day | ORAL | Status: DC
  Administered 2022-09-17: 200 mg via ORAL
  Filled 2022-09-17: qty 1

## 2022-09-17 MED ORDER — SODIUM CHLORIDE 0.9 % IV SOLN: INTRAVENOUS | Status: DC | PRN

## 2022-09-17 MED ORDER — PHENYLEPHRINE 80 MCG/ML (10ML) SYRINGE FOR IV PUSH (FOR BLOOD PRESSURE SUPPORT)
PREFILLED_SYRINGE | INTRAVENOUS | Status: DC | PRN
  Administered 2022-09-17 (×7): 160 ug via INTRAVENOUS

## 2022-09-17 MED ORDER — ORAL CARE MOUTH RINSE: 15.0000 mL | OROMUCOSAL | Status: DC | PRN

## 2022-09-17 MED ORDER — PROPOFOL 500 MG/50ML IV EMUL
INTRAVENOUS | Status: DC | PRN
  Administered 2022-09-17: 100 ug/kg/min via INTRAVENOUS

## 2022-09-17 SURGICAL SUPPLY — 22 items

## 2022-09-17 NOTE — Hospital Course (Addendum)
  77 y.o.m w/ HTN,PAF on Eliquis, first-degree AV block, ESRD on HD (MWF), anemia, back pain, gout, and prostate cancer in 2009 who present with complaints of rectal bleeding over the last 2 days. Patient had recent admission  2/29-3/2 for rectal bleeding thought secondary to lower GI bleed- HAD EGD on 3/1 which showed a small stomach nodule as well as 2 duodenal polyps.  These were not removed in the setting of acute GI bleed. GI recommends repeat EGD/EUS as an outpatient for further evaluation.  Colonoscopy showed a 15 mm polyp in the cecum that was sessile, not removed due to concern for GI bleed.  Also had a 10 mm polyp in the transverse colon with stigmata of recent bleeding.  This was removed with a hot snare.  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  The area was treated with APC.  Hemoglobin had dropped down to as low as 7.3 and he received 1 unit of PRBCs prior to being discharged.  Patient resumed Eliquis 3 days PTA. In [id number] with relatively stable vital signs.  Labs significant for hemoglobin 9.3, potassium 3.5, BUN 36, and creatinine 8.59.  Patient has been typed and screened for possible need of  blood products.  [place] [facility] have been consulted and patient had been ordered a mesalamine suppository. Monitor overnight, overall doing well GI recommended discharge home, hold Eliquis for another 5 days given recurrent bleeding.  Patient was ordered 1 unit blood transfusion with HD prior to discharge, will follow-up with GI as outpatient possible EUS for gastric nodule.

## 2022-09-17 NOTE — Progress Notes (Signed)
Patient came back from hemodialysis at approximately 2030.  Pharmacy did not bring bowel prep to the floor until after 2130.  Patient refused to drink 2nd part of the bowel prep.  He stated Dr. Havery Moros told him he only had to drink part of it and that is all he will drink.  Dr. Tarri Glenn made aware of the situation.  Monitored patient through the night.  As of now, the patient's stools are still dark black in color.  Endoscopy made aware and will make Dr. Havery Moros aware.  Earleen Reaper RN

## 2022-09-17 NOTE — Anesthesia Postprocedure Evaluation (Signed)
Anesthesia Post Note  Patient: Justin [name]  Procedure(s) Performed: COLONOSCOPY WITH PROPOFOL     Patient location during evaluation: PACU Anesthesia Type: MAC Level of consciousness: awake and alert Pain management: pain level controlled Vital Signs Assessment: post-procedure vital signs reviewed and stable Respiratory status: spontaneous breathing, nonlabored ventilation, respiratory function stable and patient connected to nasal cannula oxygen Cardiovascular status: stable and blood pressure returned to baseline Postop Assessment: no apparent nausea or vomiting Anesthetic complications: no   No notable events documented.  Last Vitals:  Vitals:   09/17/22 1040 09/17/22 1045  BP: (!) 100/38   Pulse: (!) 59 60  Resp: 10 15  Temp:    SpO2: 95% 96%    Last Pain:  Vitals:   09/17/22 1034  TempSrc:   PainSc: 0-No pain                 Blaine Hari

## 2022-09-17 NOTE — Progress Notes (Signed)
Pt receives out-pt HD at [facility] on MWF with 6:30 am chair time. Will assist as needed.   Melven Sartorius Renal Navigator [phone]

## 2022-09-17 NOTE — Interval H&P Note (Signed)
History and Physical Interval Note: Patient did only first half of prep - per nursing it is liquid stool but not clear, it is dark. No further bright red blood per rectum. Hgb has drifted to 8s. I asked him to drink more prep so I could completely evaluate his colon later today - he adamantly declines drinking any further prep, understands his exam could be limited and that I may not be able to reach the polypectomy site. He was agreeable to an enema to clear his rectum and assess  that area better. We will do the best we can to evaluate this, he understands if the prep is poor it may be very limited in what we can do, but fortunately he has stopped bleeding with holding the Eliquis. He understands risks / benefits, wishes to proceed.   09/17/2022 9:12 AM  Justin [name]  has presented today for surgery, with the diagnosis of rectal bleeding.  The various methods of treatment have been discussed with the patient and family. After consideration of risks, benefits and other options for treatment, the patient has consented to  Procedure(s): COLONOSCOPY WITH PROPOFOL (N/A) as a surgical intervention.  The patient's history has been reviewed, patient examined, no change in status, stable for surgery.  I have reviewed the patient's chart and labs.  Questions were answered to the patient's satisfaction.     [place]

## 2022-09-17 NOTE — Progress Notes (Signed)
[facility] Progress Note   Subjective: Seen in room. K+ 3.5. Fortunately caught him as he was taking lokelma and stopped him. He wants to eat food otherwise no C/Os. HD tomorrow.     Objective Vitals:   09/17/22 1034 09/17/22 1035 09/17/22 1040 09/17/22 1045  BP: (!) 104/41 (!) 104/41 (!) 100/38   Pulse: 66 66 (!) 59 60  Resp: '14 17 10 15  '$ Temp:      TempSrc:      SpO2: 92% 90% 95% 96%  Weight:      Height:       Physical Exam General:Pleasant elderly male in NAD Heart: S1,S2 +2/6 SEM. No R/G SR on monitor Lungs: CTAB Abdomen: NABS, NT Extremities: No LE edema Dialysis Access: L AVF + T/B    Additional Objective Labs: Basic Metabolic Panel: Recent Labs  Lab 09/11/22 0616 09/12/22 0444 09/16/22 0355  NA 142 138 142  K 3.6 3.2* 3.5  CL 98 97* 102  CO2 '28 29 28  '$ GLUCOSE 103* 112* 147*  BUN 27* 11 36*  CREATININE 7.92* 5.03* 8.59*  CALCIUM 8.4* 8.2* 8.4*  PHOS  --  2.8  --    Liver Function Tests: Recent Labs  Lab 09/11/22 0616 09/12/22 0444 09/16/22 0355  AST 16 73* 25  ALT 11 39 32  ALKPHOS 87 120 156*  BILITOT 0.6 1.6* 0.5  PROT 7.1 6.8 7.7  ALBUMIN 2.9* 3.4* 3.4*   No results for input(s): "LIPASE", "AMYLASE" in the last 168 hours. CBC: Recent Labs  Lab 09/11/22 0616 09/12/22 0444 09/16/22 0355 09/16/22 1240 09/16/22 2107 09/17/22 0730  WBC 6.9 7.5 9.4  --   --  8.0  NEUTROABS  --   --  5.8  --   --   --   HGB 8.4* 7.3* 9.3* 8.2* 9.1* 8.4*  HCT 28.4* 23.8* 31.3* 27.5* 30.8* 27.3*  MCV 105.6* 103.9* 102.6*  --   --  101.9*  PLT 195 152 211  --   --  167   Blood Culture    Component Value Date/Time   SDES ABSCESS 06/15/2022 1344   SPECREQUEST PERIRECTAL 06/15/2022 1344   CULT  06/15/2022 1344    FEW ESCHERICHIA COLI NO ANAEROBES ISOLATED Performed at [facility] [address], [place], [place] [address]    REPTSTATUS 06/20/2022 FINAL 06/15/2022 1344    Cardiac Enzymes: No results for input(s): "CKTOTAL",  "CKMB", "CKMBINDEX", "TROPONINI" in the last 168 hours. CBG: Recent Labs  Lab 09/11/22 1238 09/11/22 2351 09/16/22 2031  GLUCAP 94 122* 85   Iron Studies: No results for input(s): "IRON", "TIBC", "TRANSFERRIN", "FERRITIN" in the last 72 hours. '@lablastinr3'$ @ Studies/Results: No results found. Medications:   allopurinol  100 mg Oral Daily   amiodarone  200 mg Oral Daily   atorvastatin  40 mg Oral Daily   [START ON 09/18/2022] calcitRIOL  0.25 mcg Oral Q M,W,F-HD   Chlorhexidine Gluconate Cloth  6 each Topical Q0600   cinacalcet  30 mg Oral Q breakfast   mesalamine  1,000 mg Rectal BID   midodrine  10 mg Oral Once per day on Mon Wed Fri   pantoprazole  40 mg Oral Daily   peg 3350 powder  0.5 kit Oral Once   pregabalin  75 mg Oral Daily   sodium chloride flush  3 mL Intravenous Q12H   sodium zirconium cyclosilicate  10 g Oral Once per day on Mon Thu   sucroferric oxyhydroxide  500 mg Oral  BID WC     OP HD: [place] MWF c Hep none - last HD 3/4, post wt 100.6kg, dry wt lowered to 110.5 on 3/4 - rocaltrol 0.25 mcg po tiw - venofer '100mg'$  tiw IV until 3/06 - mircera 150 mcg IV q 2wks, last 2/28, due 3/13     Assessment/ Plan: GI bleed - w/ BRBPR. Hb 7- 9 here. Per pmd/ GI. Went for Endo today. Ulcers X 2 in transverse clipped. Follow HGB. Per primary ESRD - on HD MWF. Has not missed HD. Plan HD today or tonight.  HTN/ volume - dry wt just lowered on 3/04. Not grossly overloaded on admission. Net UF only 800 09/16/2022. BP soft today. Current he is at OP EDW. Run even or minimum UF with HD tomorrow.  Hyperkalemia-Has been on twice weekly Lokelma. K+ 3.5. Hold for now. Follow K+.  Anemia esrd - Hb low, next esa due in 1 week. Cont IV Fe load thru 3/06 which is today. Follow hb. Tx prn.  MBD ckd - CCa and phos are in range. Cont po vdra amd velphoro as binder.  Atrial fib - Holding eliquis continue BB  Nashla Althoff H. Laiya Wisby NP-C 09/17/2022, 12:26 PM  [employer] [phone]

## 2022-09-17 NOTE — Op Note (Signed)
[facility] Patient Name: Justin [name] Procedure Date : 09/17/2022 MRN: [id number] Attending MD: Carlota Raspberry. Havery Moros , MD, [id number] Date of Birth: 1945-09-14 CSN: [id number] Age: 77 Admit Type: Inpatient Procedure:                Colonoscopy Indications:              Rectal bleeding - history of radiation proctitis                            s/p APC on 3/1, also with polypectomy of transverse                            colon polyp on 3/1. Resumed Eliquis, came back with                            rectal bleeding. Patient drank half prep only for                            this exam, refuses to drink more Providers:                Remo Lipps P. Havery Moros, MD, Janee Morn,                            Technician, Cletis Athens, Technician Referring MD:              Medicines:                Monitored Anesthesia Care Complications:            No immediate complications. Estimated blood loss:                            Minimal. Estimated Blood Loss:     Estimated blood loss was minimal. Procedure:                Pre-Anesthesia Assessment:                           - Prior to the procedure, a History and Physical                            was performed, and patient medications and                            allergies were reviewed. The patient's tolerance of                            previous anesthesia was also reviewed. The risks                            and benefits of the procedure and the sedation                            options and risks were discussed with the patient.  All questions were answered, and informed consent                            was obtained. Prior Anticoagulants: The patient has                            taken Eliquis (apixaban), last dose was 1 day prior                            to procedure. ASA Grade Assessment: III - A patient                            with severe systemic disease. After reviewing the                             risks and benefits, the patient was deemed in                            satisfactory condition to undergo the procedure.                           After obtaining informed consent, the colonoscope                            was passed under direct vision. Throughout the                            procedure, the patient's blood pressure, pulse, and                            oxygen saturations were monitored continuously. The                            [id number] Olympus colonoscope was                            introduced through the anus and advanced to the the                            cecum, identified by appendiceal orifice and                            ileocecal valve. The colonoscopy was performed                            without difficulty. The patient tolerated the                            procedure well. The quality of the bowel                            preparation was fair. The ileocecal valve,  appendiceal orifice, and rectum were photographed. Scope In: 9:37:42 AM Scope Out: 10:12:51 AM Scope Withdrawal Time: 0 hours 23 minutes 22 seconds  Total Procedure Duration: 0 hours 35 minutes 9 seconds  Findings:      The perianal and digital rectal examinations were normal.      A roughly 20 mm polyp was found in the cecum. The polyp was sessile with       a few red markings but no stigmata for bleeding.      A single (solitary) ulcer was found in the proximal transverse colon       with one hemostasis clip. No bleeding was present. The ulcer was large       and clip was not approximating it. Three additional hemostatic clips       were successfully placed across the area to prevent bleeding in the       future.      A single (solitary) ten mm ulcer was found in the mid transverse colon.       This was not seen on the prior exam - mucosa around it appeared normal.       It was clean based. Given the patient's recent  bleeding, two hemostatic       clips were successfully placed across it to close it.      Few sessile polyps were found in the transverse colon. The polyps were       small in size and not removed.      A few medium-mouthed diverticula were found in the transverse colon and       left colon.      Maroon blood was found in the rectum and in the recto-sigmoid colon.      Following extensive lavage of the rectum, one small angiodysplastic       lesion with bleeding were found in the distal rectum near recently       treated area. Fulguration to stop the bleeding by argon plasma was       successful. A few other AVMs noted in the rectum which were treated with       APC.      The exam was otherwise without abnormality. Retroflexed views of the       rectum not obtained due to rectal irritation / recent treatment. Blood       was confined to the lower colon, I suspect he rebled from radiation       proctitis but other areas treated preventatively gien his rebleeding. Impression:               - Preparation of the colon was fair at best.                           - One 20 mm polyp in the cecum - not removed                           - A single (solitary) ulcer in the proximal                            transverse colon at site of prior polypectomy. 3                            clips were placed.                           -  A single (solitary) ulcer in the mid transverse                            colon - sporadic, no polypoid tissue in the area,                            no reported polypectomy in this area recently. 2                            clips were placed.                           - Few small polyps in the transverse colon.                           - Diverticulosis in the transverse colon and in the                            left colon.                           - Blood in the rectum and in the recto-sigmoid                            colon, lavaged.                           -  One actively bleeding angiodysplastic lesion in                            the distal rectum, along with a few other AVMs.                            Treated with argon plasma coagulation (APC).                           - The examination was otherwise normal.                           Patient had recurrent bleeding likely from                            radiation proctitis in the setting of Eliquis use.                            Other sites noted as above and treated just in case                            contributed to his presentation. Recommendation:           - Return patient to hospital ward for ongoing care.                           - Advance diet as tolerated.                           -  Continue present medications.                           - Hold Eliquis for now                           - Would benefit from Sucralfate suppositories as                            outpatient (available at compounding pharmacy only)                            to help help and prevent rebleeding                           - Monitor for recurrent bleeding                           - We will reassess him tomorrow, call with question                           - Will need outpatient colonoscopy for removal of                            remnant polyps once recovered in upcoming months Procedure Code(s):        --- Professional ---                           [phone], Colonoscopy, flexible; with control of                            bleeding, any method Diagnosis Code(s):        --- Professional ---                           D12.0, Benign neoplasm of cecum                           K63.3, Ulcer of intestine                           D12.3, Benign neoplasm of transverse colon (hepatic                            flexure or splenic flexure)                           K62.5, Hemorrhage of anus and rectum                           K92.2, Gastrointestinal hemorrhage, unspecified                           K55.21,  Angiodysplasia of colon with hemorrhage                           K57.30,  Diverticulosis of large intestine without                            perforation or abscess without bleeding CPT copyright 2022 American Medical Association. All rights reserved. The codes documented in this report are preliminary and upon coder review may  be revised to meet current compliance requirements. Remo Lipps P. Donaldson Richter, MD 09/17/2022 10:33:03 AM This report has been signed electronically. Number of Addenda: 0

## 2022-09-17 NOTE — Anesthesia Preprocedure Evaluation (Addendum)
Anesthesia Evaluation  Patient identified by MRN, date of birth, ID band Patient awake    Reviewed: Allergy & Precautions, NPO status , Patient's Chart, lab work & pertinent test results  Airway Mallampati: I  TM Distance: >3 FB Neck ROM: Full    Dental  (+) Teeth Intact, Dental Advisory Given   Pulmonary former smoker   breath sounds clear to auscultation       Cardiovascular hypertension, Pt. on home beta blockers + dysrhythmias + Valvular Problems/Murmurs  Rhythm:Regular Rate:Normal     Neuro/Psych negative neurological ROS  negative psych ROS   GI/Hepatic Neg liver ROS,GERD  Medicated,,  Endo/Other  diabetes    Renal/GU ESRF and DialysisRenal disease     Musculoskeletal  (+) Arthritis ,    Abdominal   Peds  Hematology  (+) Blood dyscrasia, anemia   Anesthesia Other Findings   Reproductive/Obstetrics                             Anesthesia Physical Anesthesia Plan  ASA: 3  Anesthesia Plan: MAC   Post-op Pain Management: Minimal or no pain anticipated   Induction: Intravenous  PONV Risk Score and Plan: 0 and Propofol infusion  Airway Management Planned: Natural Airway and Nasal Cannula  Additional Equipment: None  Intra-op Plan:   Post-operative Plan:   Informed Consent: I have reviewed the patients History and Physical, chart, labs and discussed the procedure including the risks, benefits and alternatives for the proposed anesthesia with the patient or authorized representative who has indicated his/her understanding and acceptance.       Plan Discussed with: CRNA and Anesthesiologist  Anesthesia Plan Comments:        Anesthesia Quick Evaluation

## 2022-09-17 NOTE — Progress Notes (Signed)
PROGRESS NOTE Justin [name]  [id number] DOB: 1945/11/08 DOA: 09/16/2022 PCP: Michell Heinrich, DO  Brief Narrative/Hospital Course:  77 y.o.m w/ HTN,PAF on Eliquis, first-degree AV block, ESRD on HD (MWF), anemia, back pain, gout, and prostate cancer in 2009 who present with complaints of rectal bleeding over the last 2 days. Patient had recent admission  2/29-3/2 for rectal bleeding thought secondary to lower GI bleed- HAD EGD on 3/1 which showed a small stomach nodule as well as 2 duodenal polyps.  These were not removed in the setting of acute GI bleed. GI recommends repeat EGD/EUS as an outpatient for further evaluation.  Colonoscopy showed a 15 mm polyp in the cecum that was sessile, not removed due to concern for GI bleed.  Also had a 10 mm polyp in the transverse colon with stigmata of recent bleeding.  This was removed with a hot snare.  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  The area was treated with APC.  Hemoglobin had dropped down to as low as 7.3 and he received 1 unit of PRBCs prior to being discharged.  Patient resumed Eliquis 3 days PTA. In [id number] with relatively stable vital signs.  Labs significant for hemoglobin 9.3, potassium 3.5, BUN 36, and creatinine 8.59.  Patient has been typed and screened for possible need of  blood products.  [facility] have been consulted and patient had been ordered a mesalamine suppository.    Subjective: Seen and examined, Overnight vitals/labs/events reviewed  Resting comfortably wife is at the bedside. Patient completed colonoscopy this morning-requesting food.    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  GI advised to resume diet continue to hold Eliquis and monitor overnight  Acute blood loss anemia in the setting of chronic anemia from chronic kidney disease: Mild drop in hemoglobin due to above, monitor transfuse for less than 7 g Recent Labs  Lab 09/12/22 0444 09/16/22 0355 09/16/22 1240 09/16/22 2107 09/17/22 0730  HGB 7.3* 9.3* 8.2* 9.1* 8.4*  HCT 23.8* 31.3* 27.5* 30.8* 27.3*    ESRD on HD MWF- nephrology has been consulted for HD as per schedule.  Labs showed stable potassium and bicarb.  PAF/a flutter: Hold Eliquis for now.  QTc improved, resume amiodarone.  Currently in NSR  Hypotension on admission BP stable continue midodrine, resume metoprolol if blood pressure stable  Prolonged QTc 561 monitor electrolytes and repeat EKG-QTc improved to 424  Aortic valve stenosis moderate.  Follow-up outpatient HLD continue statin Gout continue allopurinol GERD continue current regimen History of prostate cancer with radiation proctitis thought to be cause of patient's bleeding Class I  Obesity:Patient's Body mass index is 31.63 kg/m. : Will benefit with PCP follow-up, weight loss  healthy lifestyle  and outpatient sleep evaluation.  DVT prophylaxis: SCDs Start: 09/16/22 0739 Code Status:   Code Status: Full Code Family Communication: plan of care discussed with patient at bedside. Patient status is: Inpatient because of recurrent GI bleeding Level of care: Telemetry Medical   Dispo: The patient is from: home             Anticipated disposition: home tomorrow if  stable Objective: Vitals last 24 hrs: Vitals:   09/17/22 1034 09/17/22 1035 09/17/22 1040 09/17/22 1045  BP: (!) 104/41 (!) 104/41 (!) 100/38   Pulse: 66 66 (!) 59 60  Resp: '14 17 10 15  '$ Temp:      TempSrc:      SpO2: 92% 90% 95% 96%  Weight:      Height:       Weight change:   Physical Examination: General exam: alert awake oriented x 3,older than stated age. HEENT:Oral mucosa moist, Ear/Nose WNL grossly. Respiratory system: bilaterally clear BS, no use of accessory muscle. Cardiovascular system: S1 & S2 +, No JVD. Gastrointestinal system: Abdomen soft,NT,ND, BS+ Nervous System:Alert, awake, moving extremities. Extremities: LE edema neg,distal peripheral pulses palpable.  Skin: No rashes,no icterus. MSK: Normal muscle bulk,tone, power.  Medications reviewed: Scheduled Meds:  allopurinol  100 mg Oral Daily   amiodarone  200 mg Oral Daily   atorvastatin  40 mg Oral Daily   [START ON 09/18/2022] calcitRIOL  0.25 mcg Oral Q M,W,F-HD   Chlorhexidine Gluconate Cloth  6 each Topical Q0600   cinacalcet  30 mg Oral Q breakfast   mesalamine  1,000 mg Rectal BID   midodrine  10 mg Oral Once per day on Mon Wed Fri   pantoprazole  40 mg Oral Daily   peg 3350 powder  0.5 kit Oral Once   pregabalin  75 mg Oral Daily   sodium chloride flush  3 mL Intravenous Q12H   sodium zirconium cyclosilicate  10 g Oral Once per day on Mon Thu   sucroferric oxyhydroxide  500 mg Oral BID WC  Continuous Infusions:  Diet Order             Diet regular Room service appropriate? Yes; Fluid consistency: Thin  Diet effective now                  Intake/Output Summary (Last 24 hours) at 09/17/2022 1108 Last data filed at 09/17/2022 0655 Gross per 24 hour  Intake 240 ml  Output 1650 ml  Net -1410 ml   Net IO Since Admission: -1,410 mL [09/17/22 1108]  Wt Readings from Last 3 Encounters:  09/17/22 100 kg  09/11/22 100.5 kg  06/26/22 103.4 kg     Unresulted Labs (From admission, onward)     None     Data Reviewed: I have personally reviewed following labs and imaging studies CBC: Recent Labs  Lab 09/11/22 0616 09/12/22 0444 09/16/22 0355 09/16/22 1240 09/16/22 2107 09/17/22 0730  WBC 6.9 7.5 9.4  --   --  8.0  NEUTROABS  --   --  5.8  --   --   --   HGB 8.4* 7.3* 9.3* 8.2* 9.1* 8.4*  HCT 28.4* 23.8* 31.3* 27.5* 30.8* 27.3*  MCV 105.6* 103.9* 102.6*  --   --  101.9*  PLT 195 152 211  --   --  [id number]   Basic Metabolic Panel: Recent Labs  Lab 09/11/22 0616 09/12/22 0444 09/16/22 0355  NA 142 138 142  K 3.6 3.2* 3.5  CL 98  97* 102  CO2 '28 29 28  '$ GLUCOSE 103* 112* 147*  BUN 27* 11 36*  CREATININE 7.92* 5.03* 8.59*  CALCIUM 8.4* 8.2* 8.4*  PHOS  --  2.8  --    GFR: Estimated Creatinine Clearance: 8.4 mL/min (A) (by C-G formula based on SCr of 8.59 mg/dL (H)). Liver Function Tests: Recent Labs  Lab 09/11/22 0616 09/12/22 0444 09/16/22 0355  AST 16 73* 25  ALT 11 39 32  ALKPHOS 87 120 156*  BILITOT 0.6 1.6* 0.5  PROT 7.1 6.8 7.7  ALBUMIN 2.9* 3.4* 3.4*  CBG: Recent Labs  Lab 09/11/22 1238 09/11/22 2351 09/16/22 2031  GLUCAP 94 122* 85    Recent Results (from the past 240 hour(s))  MRSA Next Gen by PCR, Nasal     Status: None   Collection Time: 09/17/22  6:34 AM   Specimen: Nasal Mucosa; Nasal Swab  Result Value Ref Range Status   MRSA by PCR Next Gen NOT DETECTED NOT DETECTED Final    Comment: (NOTE) The GeneXpert MRSA Assay (FDA approved for NASAL specimens only), is one component of a comprehensive MRSA colonization surveillance program. It is not intended to diagnose MRSA infection nor to guide or monitor treatment for MRSA infections. Test performance is not FDA approved in patients less than 35 years old. Performed at [facility], [employer] [address], [place], [place] [address]   Antimicrobials: Anti-infectives (From admission, onward)    None     Culture/Microbiology    Component Value Date/Time   SDES ABSCESS 06/15/2022  1344   SPECREQUEST PERIRECTAL 06/15/2022 1344   CULT  06/15/2022 1344    FEW ESCHERICHIA COLI NO ANAEROBES ISOLATED Performed at [facility], [place] [address], [place], [place] [address]    REPTSTATUS 06/20/2022 FINAL 06/15/2022 1344   Radiology Studies: No results found.   LOS: 0 days   Antonieta Pert, MD [facility]  09/17/2022, 11:08 AM

## 2022-09-17 NOTE — Transfer of Care (Signed)
Immediate Anesthesia Transfer of Care Note  Patient: Justin [name]  Procedure(s) Performed: COLONOSCOPY WITH PROPOFOL  Patient Location: Endoscopy Unit  Anesthesia Type:MAC  Level of Consciousness: drowsy  Airway & Oxygen Therapy: Patient Spontanous Breathing and Patient connected to face mask oxygen  Post-op Assessment: Report given to RN and Post -op Vital signs reviewed and stable  Post vital signs: Reviewed and stable  Last Vitals:  Vitals Value Taken Time  BP    Temp    Pulse    Resp 23 09/17/22 1022  SpO2    Vitals shown include unvalidated device data.  Last Pain:  Vitals:   09/17/22 0915  TempSrc: Temporal  PainSc: 0-No pain         Complications: No notable events documented.

## 2022-09-18 ENCOUNTER — Other Ambulatory Visit: Payer: Self-pay | Admitting: *Deleted

## 2022-09-18 DIAGNOSIS — Z86010 Personal history of colonic polyps: Secondary | ICD-10-CM | POA: Diagnosis not present

## 2022-09-18 DIAGNOSIS — Z7901 Long term (current) use of anticoagulants: Secondary | ICD-10-CM | POA: Diagnosis not present

## 2022-09-18 DIAGNOSIS — K627 Radiation proctitis: Secondary | ICD-10-CM | POA: Diagnosis not present

## 2022-09-18 DIAGNOSIS — K922 Gastrointestinal hemorrhage, unspecified: Secondary | ICD-10-CM | POA: Diagnosis not present

## 2022-09-18 LAB — BASIC METABOLIC PANEL
Anion gap: 10 (ref 5–15)
BUN: 36 mg/dL — ABNORMAL HIGH (ref 8–23)
CO2: 29 mmol/L (ref 22–32)
Calcium: 7.7 mg/dL — ABNORMAL LOW (ref 8.9–10.3)
Chloride: 99 mmol/L (ref 98–111)
Creatinine, Ser: 7.85 mg/dL — ABNORMAL HIGH (ref 0.61–1.24)
GFR, Estimated: 6 mL/min — ABNORMAL LOW (ref >60)
Glucose, Bld: 123 mg/dL — ABNORMAL HIGH (ref 70–99)
Potassium: 3.6 mmol/L (ref 3.5–5.1)
Sodium: 138 mmol/L (ref 135–145)

## 2022-09-18 LAB — CBC
HCT: 25.9 % — ABNORMAL LOW (ref 39.0–52.0)
Hemoglobin: 7.9 g/dL — ABNORMAL LOW (ref 13.0–17.0)
MCH: 31.1 pg (ref 26.0–34.0)
MCHC: 30.5 g/dL (ref 30.0–36.0)
MCV: 102 fL — ABNORMAL HIGH (ref 80.0–100.0)
Platelets: 176 10*3/uL (ref 150–400)
RBC: 2.54 MIL/uL — ABNORMAL LOW (ref 4.22–5.81)
RDW: 17.9 % — ABNORMAL HIGH (ref 11.5–15.5)
WBC: 9.7 10*3/uL (ref 4.0–10.5)
nRBC: 0 % (ref 0.0–0.2)

## 2022-09-18 LAB — PREPARE RBC (CROSSMATCH)

## 2022-09-18 LAB — HEPATITIS B SURFACE ANTIBODY, QUANTITATIVE: Hep B S AB Quant (Post): 256.3 m[IU]/mL (ref >9.9)

## 2022-09-18 MED ORDER — ELIQUIS 5 MG PO TABS: 5.0000 mg | ORAL_TABLET | Freq: Two times a day (BID) | ORAL | Status: DC

## 2022-09-18 MED ORDER — SODIUM CHLORIDE 0.9% IV SOLUTION: Freq: Once | INTRAVENOUS | Status: DC

## 2022-09-18 MED ORDER — LIDOCAINE HCL (PF) 1 % IJ SOLN: 5.0000 mL | INTRAMUSCULAR | Status: DC | PRN

## 2022-09-18 MED ORDER — MIDODRINE HCL 5 MG PO TABS
10.0000 mg | ORAL_TABLET | Freq: Once | ORAL | Status: AC
  Administered 2022-09-18: 10 mg via ORAL
  Filled 2022-09-18: qty 2

## 2022-09-18 MED ORDER — AMBULATORY NON FORMULARY MEDICATION: 0 refills | Status: DC

## 2022-09-18 MED ORDER — LIDOCAINE-PRILOCAINE 2.5-2.5 % EX CREA: 1.0000 | TOPICAL_CREAM | CUTANEOUS | Status: DC | PRN

## 2022-09-18 MED ORDER — PENTAFLUOROPROP-TETRAFLUOROETH EX AERO: 1.0000 | INHALATION_SPRAY | CUTANEOUS | Status: DC | PRN

## 2022-09-18 NOTE — Progress Notes (Addendum)
[facility] Progress Note   Subjective:    Seen and examined patient on HD. Hgb this AM 7.9. Reviewed GI's note: will give 1 unit PRBCs with HD today for a buffer. Patient may go home today after HD.  Objective Vitals:   09/17/22 1546 09/17/22 2050 09/18/22 0511 09/18/22 0753  BP: (!) 127/105 (!) 112/52 (!) 93/51 (!) 95/53  Pulse: 69 73 73 73  Resp: '19 20 18   '$ Temp: 97.8 F (36.6 C) 98.1 F (36.7 C) 98.1 F (36.7 C) 98 F (36.7 C)  TempSrc: Oral Oral Oral Oral  SpO2: 100% 94% 94% 100%  Weight:   100.5 kg   Height:       Physical Exam General:Pleasant elderly male in NAD Heart: S1,S2 +2/6 SEM. No R/G SR on monitor Lungs: CTAB Abdomen: NABS, NT Extremities: No LE edema Dialysis Access: L AVF + T/B  Filed Weights   09/16/22 1949 09/17/22 0915 09/18/22 0511  Weight: 100 kg 100 kg 100.5 kg    Intake/Output Summary (Last 24 hours) at 09/18/2022 0926 Last data filed at 09/18/2022 0856 Gross per 24 hour  Intake 360 ml  Output --  Net 360 ml    Additional Objective Labs: Basic Metabolic Panel: Recent Labs  Lab 09/12/22 0444 09/16/22 0355 09/18/22 0501  NA 138 142 138  K 3.2* 3.5 3.6  CL 97* 102 99  CO2 '29 28 29  '$ GLUCOSE 112* 147* 123*  BUN 11 36* 36*  CREATININE 5.03* 8.59* 7.85*  CALCIUM 8.2* 8.4* 7.7*  PHOS 2.8  --   --    Liver Function Tests: Recent Labs  Lab 09/12/22 0444 09/16/22 0355  AST 73* 25  ALT 39 32  ALKPHOS 120 156*  BILITOT 1.6* 0.5  PROT 6.8 7.7  ALBUMIN 3.4* 3.4*   No results for input(s): "LIPASE", "AMYLASE" in the last 168 hours. CBC: Recent Labs  Lab 09/12/22 0444 09/16/22 0355 09/16/22 1240 09/16/22 2107 09/17/22 0730 09/18/22 0501  WBC 7.5 9.4  --   --  8.0 9.7  NEUTROABS  --  5.8  --   --   --   --   HGB 7.3* 9.3*   < > 9.1* 8.4* 7.9*  HCT 23.8* 31.3*   < > 30.8* 27.3* 25.9*  MCV 103.9* 102.6*  --   --  101.9* 102.0*  PLT 152 211  --   --  167 176   < > = values in this interval not displayed.    Blood Culture    Component Value Date/Time   SDES ABSCESS 06/15/2022 1344   SPECREQUEST PERIRECTAL 06/15/2022 1344   CULT  06/15/2022 1344    FEW ESCHERICHIA COLI NO ANAEROBES ISOLATED Performed at [facility], [place] [address] [place], [place] [address]    REPTSTATUS 06/20/2022 FINAL 06/15/2022 1344    Cardiac Enzymes: No results for input(s): "CKTOTAL", "CKMB", "CKMBINDEX", "TROPONINI" in the last 168 hours. CBG: Recent Labs  Lab 09/11/22 1238 09/11/22 2351 09/16/22 2031  GLUCAP 94 122* 85   Iron Studies: No results for input(s): "IRON", "TIBC", "TRANSFERRIN", "FERRITIN" in the last 72 hours. Lab Results  Component Value Date   INR 1.3 (H) 09/10/2022   INR 1.1 05/27/2020   INR 1.3 (H) 01/28/2020   Studies/Results: No results found.  Medications:   sodium chloride   Intravenous Once   allopurinol  100 mg Oral Daily   amiodarone  200 mg Oral Daily   atorvastatin  40 mg Oral Daily  calcitRIOL  0.25 mcg Oral Q M,W,F-HD   Chlorhexidine Gluconate Cloth  6 each Topical Q0600   cinacalcet  30 mg Oral Q breakfast   mesalamine  1,000 mg Rectal BID   midodrine  10 mg Oral Once per day on Mon Wed Fri   midodrine  10 mg Oral Once   pantoprazole  40 mg Oral Daily   peg 3350 powder  0.5 kit Oral Once   pregabalin  75 mg Oral Daily   sodium chloride flush  3 mL Intravenous Q12H   sucroferric oxyhydroxide  500 mg Oral BID WC    Dialysis Orders: [place] MWF c Hep none - last HD 3/4, post wt 100.6kg, dry wt lowered to 110.5 on 3/4 - rocaltrol 0.25 mcg po tiw - venofer '100mg'$  tiw IV until 3/06 - mircera 150 mcg IV q 2wks, last 2/28, due 3/13  Assessment/Plan: GI bleed - w/ BRBPR. Hb 7- 9 here. Per pmd/ GI. Went for Endo yesterday. Ulcers X 2 in transverse clipped. Hgb now 7.9. Per GI, give 1 unit PRBCs with HD today for a buffer since patient may be going home. Per primary ESRD - on HD MWF. Has not missed HD. Plan for HD today.  HTN/ volume - dry wt just lowered on  3/04. Not grossly overloaded on admission. Net UF only 800 09/16/2022. BP soft today. Current he is at OP EDW. Run even or minimum UF with HD today. Will give an extra Midodrine dose with HD today. Hyperkalemia-Has been on twice weekly Lokelma. K+ 3.5 yesterday. Hold for now. K+ 3.6 this AM. Will use 3K bath today. Anemia esrd - Hb low, next esa due in 1 week. Cont IV Fe load thru 3/06 which is today. Follow hb. Tx prn. See # 1 on plan for blood transfusion. MBD ckd - CCa and phos are in range. Cont po vdra amd velphoro as binder.  Atrial fib - Holding eliquis continue BB Dispo - okay for discharge today from renal standpoint after HD and blood transfusion.   Tobie Poet, NP [facility] 09/18/2022,9:26 AM  LOS: 1 day

## 2022-09-18 NOTE — TOC Transition Note (Signed)
Transition of Care [facility]) - CM/SW Discharge Note   Patient Details  Name: Justin [name] MRN: [id number] Date of Birth: [birth year]  [facility]) CM/SW Contact:  Tom-Johnson, Renea Ee, RN Phone Number: 09/18/2022, 4:09 PM   Clinical Narrative:     Patient is scheduled for discharge today. Readmission Prevention Assessment done.  Patient was ordered 1 unit blood transfusion with HD prior to discharge, will follow-up with GI as outpatient possible EUS for Gastric Nodule. Eliquis for another 5 days given recurrent bleeding.  Hospital f/u and discharge instructions on AVS.  Family to transport at discharge. No further TOC needs noted. hold      Final next level of care: Home/Self Care Barriers to Discharge: Barriers Resolved   Patient Goals and CMS Choice CMS Medicare.gov Compare Post Acute Care list provided to:: Patient Choice offered to / list presented to : NA  Discharge Placement                  Patient to be transferred to facility by: Family      Discharge Plan and Services Additional resources added to the After Visit Summary for                  DME Arranged: N/A DME Agency: NA       HH Arranged: NA HH Agency: NA        Social Determinants of Health (SDOH) Interventions SDOH Screenings   Food Insecurity: No Food Insecurity (09/16/2022)  Housing: Low Risk  (09/16/2022)  Transportation Needs: No Transportation Needs (09/16/2022)  Utilities: Not At Risk (09/16/2022)  Tobacco Use: Medium Risk (09/17/2022)     Readmission Risk Interventions    06/17/2022    2:47 PM  Readmission Risk Prevention Plan  Transportation Screening Complete  HRI or [place] Complete  Social Work Consult for [place] Planning/Counseling Complete  Palliative Care Screening Not Applicable  Medication Review [employer]) Complete

## 2022-09-18 NOTE — Discharge Summary (Signed)
Physician Discharge Summary  Justin [name] [id number] DOB: [birth year] DOA: 09/16/2022  PCP: Michell Heinrich, DO  Admit date: 09/16/2022 Discharge date: 09/18/2022 Recommendations for Outpatient Follow-up:  Follow up with PCP in 1 weeks-call for appointment Please obtain BMP/CBC in one week  Discharge Dispo: Home Discharge Condition: Stable Code Status:   Code Status: Full Code Diet recommendation:  Diet Order             Diet renal with fluid restriction Fluid restriction: 1200 mL Fluid; Room service appropriate? Yes; Fluid consistency: Thin  Diet effective now                    Brief/Interim Summary:  76 y.o.m w/ HTN,PAF on Eliquis, first-degree AV block, ESRD on HD (MWF), anemia, back pain, gout, and prostate cancer in 2009 who present with complaints of rectal bleeding over the last 2 days. Patient had recent admission  2/29-3/2 for rectal bleeding thought secondary to lower GI bleed- HAD EGD on 3/1 which showed a small stomach nodule as well as 2 duodenal polyps.  These were not removed in the setting of acute GI bleed. GI recommends repeat EGD/EUS as an outpatient for further evaluation.  Colonoscopy showed a 15 mm polyp in the cecum that was sessile, not removed due to concern for GI bleed.  Also had a 10 mm polyp in the transverse colon with stigmata of recent bleeding.  This was removed with a hot snare.  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  The area was treated with APC.  Hemoglobin had dropped down to as low as 7.3 and he received 1 unit of PRBCs prior to being discharged.  Patient resumed Eliquis 3 days PTA. In [id number] with relatively stable vital signs.  Labs significant for hemoglobin 9.3, potassium 3.5, BUN 36, and creatinine 8.59.  Patient has been typed and screened for possible need of  blood products.  [facility] have been consulted and patient had been ordered a mesalamine suppository. Monitor  overnight, overall doing well GI recommended discharge home, hold Eliquis for another 5 days given recurrent bleeding.  Patient was ordered 1 unit blood transfusion with HD prior to discharge, will follow-up with GI as outpatient possible EUS for gastric nodule.   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  GI advised to resume diet continue to hold Eliquis and monitor overnight. GI recommend sucralfate suppositories as outpatient - can only be ordered at compounding pharmacy, so GI office will coordinate.    Acute blood loss anemia in the setting of chronic anemia from chronic kidney disease: Noted some drop, giving 1 unit PRBC during his ED after discussion of risks benefits and alternatives Recent Labs  Lab 09/16/22 0355 09/16/22 1240 09/16/22 2107 09/17/22 0730 09/18/22 0501  HGB 9.3* 8.2* 9.1* 8.4* 7.9*  HCT 31.3* 27.5* 30.8* 27.3* 25.9*      ESRD on HD MWF- nephrology following getting HD today prior to discharge. Labs showed stable potassium and bicarb. PAF/a flutter: Hold Eliquisx 5 days.QTc improved, resume amiodarone.  Currently in NSR   Hypotension on admission BP stable continue midodrine, cont metoprolol if blood pressure stable Prolonged QTc 561 monitor electrolytes and repeat EKG-QTc  improved to 424 Aortic valve stenosis moderate.  Follow-up outpatient HLD continue statin Gout continue allopurinol GERD continue current regimen History of prostate cancer with radiation proctitis thought to be cause of patient's bleeding Class I  Obesity:Patient's Body mass index is 31.63 kg/m. : Will benefit with PCP follow-up, weight loss  healthy lifestyle and outpatient sleep evaluation. Consults: GI, nephrology Subjective: Alert awake oriented resting comfortably no nausea vomiting abdominal pain.  Discharge Exam: Vitals:   09/18/22 1330 09/18/22 1400  BP: (!) 136/59 127/67  Pulse: 61 64  Resp: 11 14  Temp:    SpO2: 100% 100%   General: Pt is alert, awake, not in acute distress Cardiovascular: RRR, S1/S2 +, no rubs, no gallops Respiratory: CTA bilaterally, no wheezing, no rhonchi Abdominal: Soft, NT, ND, bowel sounds + Extremities: no edema, no cyanosis  Discharge Instructions  Discharge Instructions     Discharge instructions   Complete by: As directed    Follow-up with your gastroenterology team regarding further plan call the office in 1 to 2 days  Please call call MD or return to ER for similar or worsening recurring problem that brought you to hospital or if any fever,nausea/vomiting,abdominal pain, uncontrolled pain, chest pain,  shortness of breath or any other alarming symptoms.  Please follow-up your doctor as instructed in a week time and call the office for appointment.  Please avoid alcohol, smoking, or any other illicit substance and maintain healthy habits including taking your regular medications as prescribed.  You were cared for by a hospitalist during your hospital stay. If you have any questions about your discharge medications or the care you received while you were in the hospital after you are discharged, you can call the unit and ask to speak with the hospitalist on call if the hospitalist that took care of you is not available.  Once you are  discharged, your primary care physician will handle any further medical issues. Please note that NO REFILLS for any discharge medications will be authorized once you are discharged, as it is imperative that you return to your primary care physician (or establish a relationship with a primary care physician if you do not have one) for your aftercare needs so that they can reassess your need for medications and monitor your lab values   Increase activity slowly   Complete by: As directed    No wound care   Complete by: As directed       Allergies as of 09/18/2022   No Known Allergies      Medication List     TAKE these medications    allopurinol 100 MG tablet Commonly known as: ZYLOPRIM Take 100 mg by mouth daily.   AMBULATORY NON FORMULARY MEDICATION Medication Name: Sucralfate 50 mg suppository- Insert 1 suppository into rectum twice daily x 14 days.   amiodarone 200 MG tablet Commonly known as: PACERONE Take 1 tablet (200 mg total) by mouth 2 (two) times daily. On 07/09/22, start amiodarone 200 mg once daily What  changed: when to take this   atorvastatin 40 MG tablet Commonly known as: LIPITOR Take 40 mg by mouth daily.   cinacalcet 30 MG tablet Commonly known as: SENSIPAR Take 30 mg by mouth daily.   Eliquis 5 MG Tabs tablet Generic drug: apixaban Take 1 tablet (5 mg total) by mouth 2 (two) times daily. Resume Your Eliquis after 5 days Start taking on: September 23, 2022 What changed:  additional instructions These instructions start on September 23, 2022. If you are unsure what to do until then, ask your doctor or other care provider.   HYDROcodone-acetaminophen 5-325 MG tablet Commonly known as: NORCO/VICODIN Take 1 tablet by mouth every 6 (six) hours as needed for severe pain. What changed:  when to take this reasons to take this   Lokelma 10 g Pack packet Generic drug: sodium zirconium cyclosilicate Take 10 g by mouth 2 (two) times a week. Tuesday and Thursday    metoprolol tartrate 25 MG tablet Commonly known as: LOPRESSOR Take 0.5 tablets (12.5 mg total) by mouth 2 (two) times daily. What changed:  how much to take when to take this   midodrine 5 MG tablet Commonly known as: PROAMATINE Take 10 mg by mouth 3 (three) times a week. Prior to dialysis M-W-F   MIRCERA IJ Mircera   omeprazole 20 MG capsule Commonly known as: PRILOSEC Take 20 mg by mouth every morning.   pregabalin 75 MG capsule Commonly known as: LYRICA Take 1 capsule (75 mg total) by mouth 2 (two) times daily. What changed: when to take this   Velphoro 500 MG chewable tablet Generic drug: sucroferric oxyhydroxide Chew 500 mg by mouth 2 (two) times daily with a meal.        No Known Allergies  The results of significant diagnostics from this hospitalization (including imaging, microbiology, ancillary and laboratory) are listed below for reference.    Microbiology: Recent Results (from the past 240 hour(s))  MRSA Next Gen by PCR, Nasal     Status: None   Collection Time: 09/17/22  6:34 AM   Specimen: Nasal Mucosa; Nasal Swab  Result Value Ref Range Status   MRSA by PCR Next Gen NOT DETECTED NOT DETECTED Final    Comment: (NOTE) The GeneXpert MRSA Assay (FDA approved for NASAL specimens only), is one component of a comprehensive MRSA colonization surveillance program. It is not intended to diagnose MRSA infection nor to guide or monitor treatment for MRSA infections. Test performance is not FDA approved in patients less than 38 years old. Performed at [facility], [place] [address], [place], [place] [address]     Procedures/Studies: DG Chest Portable 1 View  Result Date: 09/10/2022 CLINICAL DATA:  Provided history: Hypoxia. EXAM: PORTABLE CHEST 1 VIEW COMPARISON:  Prior chest radiographs 06/16/2022 and earlier. FINDINGS: Shallow inspiration radiograph, accentuating the cardiac silhouette and limiting evaluation of heart size. Aortic atherosclerosis.  Bilateral perihilar and left lung base opacities, which may reflect atelectasis, edema and/or atypical/viral infection. No evidence of pleural effusion or pneumothorax. No acute osseous abnormality identified. Chronic widening of the left acromioclavicular joint. A vascular stent projects in the left subclavian/axillary region. IMPRESSION: Shallow inspiration radiograph. Bilateral perihilar and left lung base opacities which may reflect atelectasis, edema and/or atypical/viral infection. Electronically Signed   By: Kellie Simmering D.O.   On: 09/10/2022 12:17   CT ANGIO GI BLEED  Result Date: 09/10/2022 CLINICAL DATA:  Rectal bleeding EXAM: CTA ABDOMEN AND PELVIS WITHOUT AND WITH CONTRAST TECHNIQUE: Multidetector CT imaging  of the abdomen and pelvis was performed using the standard protocol during bolus administration of intravenous contrast. Multiplanar reconstructed images and MIPs were obtained and reviewed to evaluate the vascular anatomy. RADIATION DOSE REDUCTION: This exam was performed according to the departmental dose-optimization program which includes automated exposure control, adjustment of the mA and/or kV according to patient size and/or use of iterative reconstruction technique. CONTRAST:  75 mL OMNIPAQUE IOHEXOL 350 MG/ML SOLN COMPARISON:  08/19/2022 FINDINGS: VASCULAR Aorta: Dense atheromatous calcifications. No aneurysm or dissection. Celiac: Atheromatous changes no evidence of occlusion. SMA: Atheromatous changes no evidence of occlusion. Renals: Atheromatous changes no evidence of occlusion. IMA: Atheromatous changes no evidence of occlusion. Inflow: Patent without evidence of aneurysm, dissection, vasculitis or significant stenosis. Proximal Outflow: Bilateral common femoral and visualized portions of the superficial and profunda femoral arteries are patent without evidence of aneurysm, dissection, vasculitis or significant stenosis. Veins: No obvious venous abnormality within the limitations  of this arterial phase study. Review of the MIP images confirms the above findings. NON-VASCULAR Lower chest: Dependent basilar subsegmental atelectasis. No pleural or pericardial effusion identified. There is bilateral gynecomastia. Hepatobiliary: Distended gallbladder with layering milk calcium or calcified stones. No hepatic parenchymal abnormalities or biliary ductal dilatation identified. Pancreas: Unremarkable. No pancreatic ductal dilatation or surrounding inflammatory changes. Spleen: Normal in size without focal abnormality. Adrenals/Urinary Tract: No adrenal lesions. Kidneys are atrophic. There are numerous centimeter-sized cystic lesions in both kidneys that do not need further imaging follow up. No hydronephrosis or nephrolithiasis identified. Urinary bladder empty. Stomach/Bowel: Stomach is within normal limits. Appendix appears normal. No evidence of bowel wall thickening, distention, or inflammatory changes. Delayed postcontrast images demonstrate no evidence of extravasation of contrast indicate the presence of active GI hemorrhage. Lymphatic: No suspicious adenopathy identified. Reproductive: Prostate brachytherapy seeds noted. Prostate appears mildly prominent. Other: Small abdominal wall periumbilical defect containing omental fat without evidence of incarceration Musculoskeletal: Diffuse osteoblastic changes with the differential including diffuse metastatic disease versus renal osteodystrophy. IMPRESSION: VASCULAR 1. Dense atheromatous changes noted for the aorta and its branches without evidence of significant occlusive changes, aneurysm or dissection. NON-VASCULAR 1. No evidence of extravasation of IV contrast to indicate the presence of active GI bleed. 2. Sequelae of end-stage renal disease. 3. Distended gallbladder with evidence of stones or sludge. 4. 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 5. Bilateral gynecomastia. Electronically  Signed   By: Sammie Bench M.D.   On: 09/10/2022 11:20    Labs: BNP (last 3 results) No results for input(s): "BNP" in the last 8760 hours. Basic Metabolic Panel: Recent Labs  Lab 09/12/22 0444 09/16/22 0355 09/18/22 0501  NA 138 142 138  K 3.2* 3.5 3.6  CL 97* 102 99  CO2 '29 28 29  '$ GLUCOSE 112* 147* 123*  BUN 11 36* 36*  CREATININE 5.03* 8.59* 7.85*  CALCIUM 8.2* 8.4* 7.7*  PHOS 2.8  --   --    Liver Function Tests: Recent Labs  Lab 09/12/22 0444 09/16/22 0355  AST 73* 25  ALT 39 32  ALKPHOS 120 156*  BILITOT 1.6* 0.5  PROT 6.8 7.7  ALBUMIN 3.4* 3.4*   Recent Labs  Lab 09/12/22 0444 09/16/22 0355 09/16/22 1240 09/16/22 2107 09/17/22 0730 09/18/22 0501  WBC 7.5 9.4  --   --  8.0 9.7  NEUTROABS  --  5.8  --   --   --   --   HGB 7.3* 9.3* 8.2* 9.1* 8.4* 7.9*  HCT 23.8* 31.3* 27.5* 30.8*  27.3* 25.9*  MCV 103.9* 102.6*  --   --  101.9* 102.0*  PLT 152 211  --   --  167 176   Recent Labs  Lab 09/11/22 2351 09/16/22 2031  GLUCAP 122* 85  Anemia work up No results for input(s): "VITAMINB12", "FOLATE", "FERRITIN", "TIBC", "IRON", "RETICCTPCT" in the last 72 hours. Urinalysis    Component Value Date/Time   COLORURINE AMBER (A) 05/27/2020 1451   APPEARANCEUR CLOUDY (A) 05/27/2020 1451   LABSPEC 1.017 05/27/2020 1451   PHURINE 6.0 05/27/2020 1451   GLUCOSEU 50 (A) 05/27/2020 1451   HGBUR LARGE (A) 05/27/2020 1451   BILIRUBINUR NEGATIVE 05/27/2020 1451   KETONESUR NEGATIVE 05/27/2020 1451   PROTEINUR 100 (A) 05/27/2020 1451   UROBILINOGEN 0.2 08/02/2010 1534   NITRITE NEGATIVE 05/27/2020 1451   LEUKOCYTESUR SMALL (A) 05/27/2020 1451   Sepsis Labs Recent Labs  Lab 09/12/22 0444 09/16/22 0355 09/17/22 0730 09/18/22 0501  WBC 7.5 9.4 8.0 9.7   Microbiology Recent Results (from the past 240 hour(s))  MRSA Next Gen by PCR, Nasal     Status: None   Collection Time: 09/17/22  6:34 AM   Specimen: Nasal Mucosa; Nasal Swab  Result Value Ref Range  Status   MRSA by PCR Next Gen NOT DETECTED NOT DETECTED Final    Comment: (NOTE) The GeneXpert MRSA Assay (FDA approved for NASAL specimens only), is one component of a comprehensive MRSA colonization surveillance program. It is not intended to diagnose MRSA infection nor to guide or monitor treatment for MRSA infections. Test performance is not FDA approved in patients less than 44 years old. Performed at [facility], [place] [address] [place],  [address]      Time coordinating discharge: 35 minutes  SIGNED: Antonieta Pert, MD  [facility] 09/18/2022, 2:12 PM  If 7PM-7AM, please contact night-coverage [email or web address]

## 2022-09-18 NOTE — Progress Notes (Signed)
D/C order noted. Contacted [facility] to advise clinic of pt's d/c today and that pt should resume care on Monday. Pt required inpt HD today due to need for blood transfusion.   Melven Sartorius Renal Navigator [phone]

## 2022-09-18 NOTE — Progress Notes (Signed)
POST HD TX NOTE  09/18/22 1448  Vitals  Temp 98.7 F (37.1 C)  Temp Source Oral  BP (!) 122/53  MAP (mmHg) 72  BP Location Right Arm  BP Method Automatic  Patient Position (if appropriate) Lying  Pulse Rate 66  Pulse Rate Source Monitor  ECG Heart Rate 67  Resp 16  Oxygen Therapy  SpO2 100 %  O2 Device Nasal Cannula  O2 Flow Rate (L/min) 2 L/min  Pulse Oximetry Type Continuous  During Treatment Monitoring  Intra-Hemodialysis Comments (S)   (post HD tx VS check)  Post Treatment  Dialyzer Clearance Lightly streaked  Duration of HD Treatment -hour(s) 3.75 hour(s)  Hemodialysis Intake (mL) 365 mL (PRBC = 311m + NS flush = 532m= 3652m Liters Processed 90  Fluid Removed (mL) 1035 mL (1400m17malue from machine) - 365ml64mBC + NS flush) = 1035ml)26mlerated HD Treatment Yes  Post-Hemodialysis Comments (S)  tx completed w/o problem, UF goal met, blood rinsed back. i unit PRBC admin. Medication Admin: none  AVG/AVF Arterial Site Held (minutes) 10 minutes  AVG/AVF Venous Site Held (minutes) 10 minutes  Fistula / Graft Left Upper arm Arteriovenous fistula  No Placement Date or Time found.   Placed prior to admission: Yes  Orientation: Left  Access Location: Upper arm  Access Type: Arteriovenous fistula  Site Condition No complications  Fistula / Graft Assessment Bruit;Thrill;Present  Drainage Description None

## 2022-09-18 NOTE — Final Progress Note (Signed)
Progress Note   Subjective  Patient did well overnight, no further bleeding. Eating well, wants to go home   Objective   Vital signs in last 24 hours: Temp:  [97.6 F (36.4 C)-98.1 F (36.7 C)] 98.1 F (36.7 C) (03/08 0511) Pulse Rate:  [59-73] 73 (03/08 0511) Resp:  [10-22] 18 (03/08 0511) BP: (93-266)/(38-238) 93/51 (03/08 0511) SpO2:  [90 %-100 %] 94 % (03/08 0511) Weight:  [100 kg-100.5 kg] 100.5 kg (03/08 0511) Last BM Date : 09/17/22 General:    AA male in NAD Neurologic:  Alert and oriented,  grossly normal neurologically. Psych:  Cooperative. Normal mood and affect.  Intake/Output from previous day: 03/07 0701 - 03/08 0700 In: 120 [P.O.:120] Out: -  Intake/Output this shift: No intake/output data recorded.  Lab Results: Recent Labs    09/16/22 0355 09/16/22 1240 09/16/22 2107 09/17/22 0730 09/18/22 0501  WBC 9.4  --   --  8.0 9.7  HGB 9.3*   < > 9.1* 8.4* 7.9*  HCT 31.3*   < > 30.8* 27.3* 25.9*  PLT 211  --   --  167 176   < > = values in this interval not displayed.   BMET Recent Labs    09/16/22 0355 09/18/22 0501  NA 142 138  K 3.5 3.6  CL 102 99  CO2 28 29  GLUCOSE 147* 123*  BUN 36* 36*  CREATININE 8.59* 7.85*  CALCIUM 8.4* 7.7*   LFT Recent Labs    09/16/22 0355  PROT 7.7  ALBUMIN 3.4*  AST 25  ALT 32  ALKPHOS 156*  BILITOT 0.5   PT/INR No results for input(s): "LABPROT", "INR" in the last 72 hours.  Studies/Results: No results found.     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 This was ablated with APC as well as a few other small AVMs in the area.  He also had large post polypectomy ulcer in his colon which was clipped, in addition to another sporadic ulcer in his  colon which was clipped, however I do not think either of these ulcers were causing his bleeding.  He has responded well to therapy, no further bleeding noted.  His hemoglobin has slightly drifted likely due to reequilibration.  I think he is okay to go home today after dialysis.  Defer to primary team if they want to give him a unit of blood to give him some buffer as his hemoglobin has down trended.  I would hold Eliquis for another 5 days or so given his recurrent bleeding, if on for A-fib.  As outpatient, would recommend sucralfate suppository which will help the radiation proctitis heal and prevent rebleeding.  Our office will coordinate that for him at a compounding pharmacy locally, and contact him with the specific information.  It is unfortunately not available at most pharmacies or in the hospital.  Moving forward, he will f/u with [place] on 4/10.  At some point he will need repeat colonoscopy for polypectomy of large cecal polyp once he recovers from these more acute issues, and EUS of gastric nodule noted on prior EGD.  He agreed with the plan, all questions answered.  He will contact us should he have rebleeding, hopefully that risk is low  at this point in time.  RECOMMEND: - continue regular diet - okay to discharge later today after dialysis - hold Eliquis another 5 days if okay per primary service given recurrent bleeding and endoscopic treatment x 2 in the past week - defer to primary team if they wish to give another unit of PRBC during HD - recommend sucralfate suppositories as outpatient - can only be ordered at compounding pharmacy, our office will coordinate - outpatient follow up in our office 10/21/22 at 1110 with Dr. Lorenso Courier - will discuss follow up colonoscopy and possible EUS for gastric nodule noted on prior EGD - repeat Hgb next week to ensure stable  Call with questions, we will sign off for now.  Jolly Mango, MD [facility]

## 2022-09-18 NOTE — Progress Notes (Signed)
Patient discharged to home, he will follow up with GI office regarding appointment and prescriptions. IV removed, tele box returned, staff assisted patient to the exit.

## 2022-09-19 LAB — BPAM RBC
Blood Product Expiration Date: 202404042359
ISSUE DATE / TIME: 202403081044
Unit Type and Rh: 5100

## 2022-09-19 LAB — TYPE AND SCREEN
ABO/RH(D): O POS
Antibody Screen: NEGATIVE
Unit division: 0

## 2022-09-20 NOTE — TOC Transition Note (Signed)
Transition of Care - Initial Contact from Inpatient Facility  Date of discharge: 09/18/22 Date of contact: 09/20/22  Method: Phone Spoke to: Patient  Patient contacted to discuss transition of care from recent inpatient hospitalization. Patient was admitted to [facility] from 3/6-3/8 with discharge diagnosis of GI bleed  The discharge medication list was reviewed. Patient understands the changes and has no concerns.   Patient will return to his outpatient HD unit on: Monday March 11th at [facility]  No other concerns at this time.  Tobie Poet, NP

## 2022-09-21 ENCOUNTER — Encounter (HOSPITAL_COMMUNITY): Payer: Self-pay | Admitting: Gastroenterology

## 2022-09-22 ENCOUNTER — Telehealth: Payer: Self-pay | Admitting: Gastroenterology

## 2022-09-22 ENCOUNTER — Other Ambulatory Visit: Payer: Self-pay

## 2022-09-22 ENCOUNTER — Encounter: Payer: Self-pay | Admitting: Internal Medicine

## 2022-09-22 ENCOUNTER — Emergency Department (HOSPITAL_COMMUNITY)
Admission: EM | Admit: 2022-09-22 | Discharge: 2022-09-23 | Disposition: A | Discharge status: Home / Self Care | Payer: Medicare Other | Attending: Emergency Medicine | Admitting: Emergency Medicine

## 2022-09-22 ENCOUNTER — Encounter: Payer: Self-pay | Admitting: Gastroenterology

## 2022-09-22 ENCOUNTER — Encounter (HOSPITAL_COMMUNITY): Payer: Self-pay

## 2022-09-22 DIAGNOSIS — N186 End stage renal disease: Secondary | ICD-10-CM

## 2022-09-22 DIAGNOSIS — I120 Hypertensive chronic kidney disease with stage 5 chronic kidney disease or end stage renal disease: Secondary | ICD-10-CM | POA: Insufficient documentation

## 2022-09-22 DIAGNOSIS — Z79899 Other long term (current) drug therapy: Secondary | ICD-10-CM | POA: Insufficient documentation

## 2022-09-22 DIAGNOSIS — E876 Hypokalemia: Secondary | ICD-10-CM | POA: Diagnosis not present

## 2022-09-22 DIAGNOSIS — Z992 Dependence on renal dialysis: Secondary | ICD-10-CM | POA: Diagnosis not present

## 2022-09-22 DIAGNOSIS — Z7901 Long term (current) use of anticoagulants: Secondary | ICD-10-CM | POA: Insufficient documentation

## 2022-09-22 DIAGNOSIS — Z8719 Personal history of other diseases of the digestive system: Secondary | ICD-10-CM

## 2022-09-22 DIAGNOSIS — E1122 Type 2 diabetes mellitus with diabetic chronic kidney disease: Secondary | ICD-10-CM | POA: Diagnosis not present

## 2022-09-22 DIAGNOSIS — D539 Nutritional anemia, unspecified: Secondary | ICD-10-CM | POA: Diagnosis not present

## 2022-09-22 LAB — CBC
HCT: 34.3 % — ABNORMAL LOW (ref 39.0–52.0)
Hemoglobin: 10.3 g/dL — ABNORMAL LOW (ref 13.0–17.0)
MCH: 30.7 pg (ref 26.0–34.0)
MCHC: 30 g/dL (ref 30.0–36.0)
MCV: 102.4 fL — ABNORMAL HIGH (ref 80.0–100.0)
Platelets: 191 10*3/uL (ref 150–400)
RBC: 3.35 MIL/uL — ABNORMAL LOW (ref 4.22–5.81)
RDW: 16.9 % — ABNORMAL HIGH (ref 11.5–15.5)
WBC: 7.7 10*3/uL (ref 4.0–10.5)
nRBC: 0 % (ref 0.0–0.2)

## 2022-09-22 LAB — HEMOGLOBIN AND HEMATOCRIT, BLOOD
HCT: 33.6 % — ABNORMAL LOW (ref 39.0–52.0)
Hemoglobin: 10.3 g/dL — ABNORMAL LOW (ref 13.0–17.0)

## 2022-09-22 LAB — COMPREHENSIVE METABOLIC PANEL
ALT: 13 U/L (ref 0–44)
AST: 15 U/L (ref 15–41)
Albumin: 3.2 g/dL — ABNORMAL LOW (ref 3.5–5.0)
Alkaline Phosphatase: 128 U/L — ABNORMAL HIGH (ref 38–126)
Anion gap: 11 (ref 5–15)
BUN: 27 mg/dL — ABNORMAL HIGH (ref 8–23)
CO2: 32 mmol/L (ref 22–32)
Calcium: 7.9 mg/dL — ABNORMAL LOW (ref 8.9–10.3)
Chloride: 99 mmol/L (ref 98–111)
Creatinine, Ser: 8.28 mg/dL — ABNORMAL HIGH (ref 0.61–1.24)
GFR, Estimated: 6 mL/min — ABNORMAL LOW (ref >60)
Glucose, Bld: 161 mg/dL — ABNORMAL HIGH (ref 70–99)
Potassium: 3.3 mmol/L — ABNORMAL LOW (ref 3.5–5.1)
Sodium: 142 mmol/L (ref 135–145)
Total Bilirubin: 0.4 mg/dL (ref 0.3–1.2)
Total Protein: 7.8 g/dL (ref 6.5–8.1)

## 2022-09-22 LAB — TYPE AND SCREEN
ABO/RH(D): O POS
Antibody Screen: NEGATIVE

## 2022-09-22 NOTE — Telephone Encounter (Signed)
Thanks for the heads up. If patient ends up going to [facility] I have put their team on here as well. Sometimes RFA can be helpful for radiation proctitis.  However patient could have just had diverticular bleeding 2.  Time will tell. GM

## 2022-09-22 NOTE — ED Provider Notes (Signed)
[place] EMERGENCY DEPARTMENT AT [facility] Provider Note   CSN: [id number] Arrival date & time: 09/22/22  1728     History  Chief Complaint  Patient presents with   Rectal Bleeding    Justin [name] is a 77 y.o. male.  The history is provided by the patient.  Rectal Bleeding He has history of hypertension, diabetes, end-stage renal disease on hemodialysis and has been having GI bleeding for the last several weeks and has had 2 colonoscopies.  He had blood transfusion 3 days ago.  Today, he noted bright red blood in his diaper.  He states he has not had a bowel movement since his last colonoscopy.  He denies any abdominal pain, nausea, vomiting.   Home Medications Prior to Admission medications   Medication Sig Start Date End Date Taking? Authorizing Provider  allopurinol (ZYLOPRIM) 100 MG tablet Take 100 mg by mouth daily. 09/13/21   [provider]  AMBULATORY NON FORMULARY MEDICATION Medication Name: Sucralfate 50 mg suppository- Insert 1 suppository into rectum twice daily x 14 days. 09/18/22   Armbruster, Willaim Rayas, MD  amiodarone (PACERONE) 200 MG tablet Take 1 tablet (200 mg total) by mouth 2 (two) times daily. On 07/09/22, start amiodarone 200 mg once daily Patient taking differently: Take 200 mg by mouth daily. On 07/09/22, start amiodarone 200 mg once daily 06/26/22   Tat, Onalee Hua, MD  atorvastatin (LIPITOR) 40 MG tablet Take 40 mg by mouth daily.  10/30/14   [provider]  cinacalcet (SENSIPAR) 30 MG tablet Take 30 mg by mouth daily. 08/25/22   [provider]  ELIQUIS 5 MG TABS tablet Take 1 tablet (5 mg total) by mouth 2 (two) times daily. Resume Your Eliquis after 5 days 09/23/22   Lanae Boast, MD  HYDROcodone-acetaminophen (NORCO/VICODIN) 5-325 MG tablet Take 1 tablet by mouth every 6 (six) hours as needed for severe pain. Patient taking differently: Take 1 tablet by mouth daily as needed for moderate pain. 06/26/22   Catarina Hartshorn, MD   LOKELMA 10 g PACK packet Take 10 g by mouth 2 (two) times a week. Tuesday and Thursday 06/08/22   [provider]  Methoxy PEG-Epoetin Beta (MIRCERA IJ) Mircera 06/13/21 12/18/22  [provider]  metoprolol tartrate (LOPRESSOR) 25 MG tablet Take 0.5 tablets (12.5 mg total) by mouth 2 (two) times daily. Patient taking differently: Take 25 mg by mouth daily. 06/26/22   Catarina Hartshorn, MD  midodrine (PROAMATINE) 5 MG tablet Take 10 mg by mouth 3 (three) times a week. Prior to dialysis M-W-F 08/13/22   [provider]  omeprazole (PRILOSEC) 20 MG capsule Take 20 mg by mouth every morning.     [provider]  pregabalin (LYRICA) 75 MG capsule Take 1 capsule (75 mg total) by mouth 2 (two) times daily. Patient taking differently: Take 75 mg by mouth daily. 06/26/22   Catarina Hartshorn, MD  VELPHORO 500 MG chewable tablet Chew 500 mg by mouth 2 (two) times daily with a meal. 06/03/22   [provider]      Allergies    Patient has no known allergies.    Review of Systems   Review of Systems  Gastrointestinal:  Positive for hematochezia.  All other systems reviewed and are negative.   Physical Exam Updated Vital Signs BP 108/65 (BP Location: Right Arm)   Pulse 66   Temp (!) 97.5 F (36.4 C) (Oral)   Resp 16   Ht 5\' 10"  (1.778 m)  Wt 96.6 kg   SpO2 96%   BMI 30.56 kg/m  Physical Exam Vitals and nursing note reviewed.   77 year old male, resting comfortably and in no acute distress. Vital signs are normal. Oxygen saturation is 96%, which is normal. Head is normocephalic and atraumatic. PERRLA, EOMI. Oropharynx is clear. Neck is nontender and supple without adenopathy or JVD. Back is nontender and there is no CVA tenderness. Lungs are clear without rales, wheezes, or rhonchi. Chest is nontender. Heart has regular rate and rhythm with 3/6 holosystolic murmur. Abdomen is soft, flat, nontender. Rectal: Normal sphincter tone with mild anal stenosis.   Moderate amount of soft greenish-black stool present, no gross blood and no masses.  Stool is Hemoccult negative per my testing. Extremities have no cyanosis or edema, full range of motion is present.  AV fistula present left upper arm with thrill present. Skin is warm and dry without rash. Neurologic: Mental status is normal, cranial nerves are intact, moves all extremities equally.   ED Results / Procedures / Treatments   Labs (all labs ordered are listed, but only abnormal results are displayed) Labs Reviewed  COMPREHENSIVE METABOLIC PANEL - Abnormal; Notable for the following components:      Result Value   Potassium 3.3 (*)    Glucose, Bld 161 (*)    BUN 27 (*)    Creatinine, Ser 8.28 (*)    Calcium 7.9 (*)    Albumin 3.2 (*)    Alkaline Phosphatase 128 (*)    GFR, Estimated 6 (*)    All other components within normal limits  CBC - Abnormal; Notable for the following components:   RBC 3.35 (*)    Hemoglobin 10.3 (*)    HCT 34.3 (*)    MCV 102.4 (*)    RDW 16.9 (*)    All other components within normal limits  HEMOGLOBIN AND HEMATOCRIT, BLOOD - Abnormal; Notable for the following components:   Hemoglobin 10.3 (*)    HCT 33.6 (*)    All other components within normal limits  POC OCCULT BLOOD, ED  POC OCCULT BLOOD, ED  TYPE AND SCREEN    EKG EKG Interpretation  Date/Time:  Tuesday September 22 2022 23:02:09 EDT Ventricular Rate:  67 PR Interval:  279 QRS Duration: 91 QT Interval:  436 QTC Calculation: 461 R Axis:   52 Text Interpretation: Sinus rhythm Atrial premature complexes Prolonged PR interval Nonspecific T wave abnormality When compared with ECG of 09/17/2022, No significant change was found Confirmed by Dione Booze ([id number]) on 09/23/2022 12:19:40 AM  Procedures Procedures    Medications Ordered in ED Medications - No data to display  ED Course/ Medical Decision Making/ A&P                             Medical Decision Making Amount and/or Complexity of  Data Reviewed Labs: ordered.   Recurrent rectal bleeding.  However, on my exam, no evidence of blood.  Stools greenish-black but with negative Hemoccult.  I have reviewed and interpreted his laboratory tests and my interpretation is macrocytic anemia which is improved compared with 09/18/2022, but that was before his blood transfusion.  Mild hypokalemia noted as well as end-stage renal disease.  I will repeat hemoglobin to see if it is dropping (has been over 5 hours since initial hemoglobin was drawn).  I am also ordering orthostatic vital signs to be checked.  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  Repeat hemoglobin is unchanged.  Orthostatic vital signs showed no significant change in pulse or blood pressure.  I have reviewed and interpreted his electrocardiogram, and my interpretation is nonspecific T wave changes which are unchanged from prior.  There is no evidence of active bleeding, patient is reassured and I am discharging him to continue follow-up with his gastroenterologist and nephrologist.  Final Clinical Impression(s) / ED Diagnoses Final diagnoses:  History of rectal bleeding  Macrocytic anemia  Hypokalemia  End-stage renal disease on hemodialysis [facility])    Rx / DC Orders ED Discharge Orders     None         Dione Booze, MD 09/23/22 0025

## 2022-09-22 NOTE — ED Triage Notes (Signed)
Pt c/o rectal bleeding x3 weeks.  Pt has been admitted twice recently for same.  Pt's wife report he had "clamp placed" during both visits.  Denies pain.

## 2022-09-22 NOTE — Telephone Encounter (Signed)
The pt caregiver called to report that the pt began to have rectal bleeding independent of stool this morning.  He had colon yesterday for rectal bleeding.  The caregiver wanted to make Dr Lorenso Courier and Armbruster aware that he is on the way to [facility] ED.

## 2022-09-22 NOTE — ED Provider Triage Note (Signed)
Emergency Medicine Provider Triage Evaluation Note  Justin [name] , a 77 y.o. male  was evaluated in triage.  Pt complains of rectal bleeding.  Patient reports that he was recently here in the emergency department for similar complaints 2 previous times which resulted in having a colonoscopy formed and clippings performed as well.  He reports that he still having rectal bleeding not associate with bowel movements.  States last dose of blood thinner was approximately 1 week ago.  Denies any significant pain with bowel movements or noticing blood in toilet bowl but reports the majority of blood is found on his brief.  Review of Systems  Positive: As above Negative: As above  Physical Exam  BP 111/68 (BP Location: Right Arm)   Pulse 61   Temp 97.8 F (36.6 C)   Resp 18   Ht '5\' 10"'$  (1.778 m)   Wt 96.6 kg   SpO2 95%   BMI 30.56 kg/m  Gen:   Awake, no distress   Resp:  Normal effort  MSK:   Moves extremities without difficulty  Other:   Medical Decision Making  Medically screening exam initiated at 6:48 PM.  Appropriate orders placed.  Justin [name] was informed that the remainder of the evaluation will be completed by another provider, this initial triage assessment does not replace that evaluation, and the importance of remaining in the ED until their evaluation is complete.     Justin Heller, PA-C 09/22/22 1849

## 2022-09-22 NOTE — Telephone Encounter (Signed)
PT niece is calling to let us know that PT has started bleeding from rectum again and if he hasn't stopped by 2 pm she will be taking him to the ED but she wanted to know what would be best. Please advise.

## 2022-09-22 NOTE — Telephone Encounter (Signed)
Sorry to hear this. Hopefully he did not resume his Eliquis yet, that was supposed to be held for at least 5 days from his hospitlization. Had radiation proctitis treated, thought to be cause of symptoms, and was supposed to be on sucralfate suppositories for this which were ordered at compounding pharmacy, hopefully he was able to start that. Colonoscopy showed some ulcers in his colon which I did not think causing his symptoms but were clipped. Assuming he is having some recurrent bleeding from the radiation proctitis which has been difficult to treat recently. If the only way to check his labs and make sure stable is to go to the ED then that is what has to be done, he has baseline anemia and not much reserve for any significant bleeding. Otherwise needs Eliquis held until then

## 2022-09-23 LAB — POC OCCULT BLOOD, ED: Fecal Occult Bld: NEGATIVE

## 2022-09-23 NOTE — Discharge Instructions (Signed)
Continue to go for your dialysis as scheduled.  Follow-up with your gastroenterologist as scheduled.  Return to the emergency department if you are having any concerning symptoms.

## 2022-09-23 NOTE — ED Notes (Signed)
Upon discharging patient, he became agitated and refused to let me obtain vitals. He became irate and would not explain to me what had gotten him upset. He then became irate with his family member that was here, and they began to argue. Patient dressed and wheeled to lobby in wheelchair, to await family member pulling up.

## 2022-09-23 NOTE — ED Notes (Signed)
Pt refusing DC vitals.

## 2022-09-24 ENCOUNTER — Telehealth: Payer: Self-pay

## 2022-09-24 ENCOUNTER — Other Ambulatory Visit: Payer: Self-pay

## 2022-09-24 DIAGNOSIS — K627 Radiation proctitis: Secondary | ICD-10-CM

## 2022-09-24 DIAGNOSIS — K922 Gastrointestinal hemorrhage, unspecified: Secondary | ICD-10-CM

## 2022-09-24 DIAGNOSIS — K635 Polyp of colon: Secondary | ICD-10-CM

## 2022-09-24 DIAGNOSIS — K3189 Other diseases of stomach and duodenum: Secondary | ICD-10-CM

## 2022-09-24 DIAGNOSIS — K921 Melena: Secondary | ICD-10-CM

## 2022-09-24 DIAGNOSIS — K633 Ulcer of intestine: Secondary | ICD-10-CM

## 2022-09-24 DIAGNOSIS — D649 Anemia, unspecified: Secondary | ICD-10-CM

## 2022-09-24 MED ORDER — NA SULFATE-K SULFATE-MG SULF 17.5-3.13-1.6 GM/177ML PO SOLN: 1.0000 | Freq: Once | ORAL | 0 refills | Status: AC

## 2022-09-24 NOTE — Telephone Encounter (Signed)
[facility] Pre-operative Risk Assessment     Request for surgical clearance:     Endoscopy Procedure  What type of surgery is being performed?     Colon EMR EUS   When is this surgery scheduled?     11/12/22  What type of clearance is required ?   Pharmacy  Are there any medications that need to be held prior to surgery and how long? Eliquis  Practice name and name of physician performing surgery?      [facility]  What is your office phone and fax number?      Phone- [phone]  [phone]  Anesthesia type (None, local, MAC, general) ?       MAC

## 2022-09-24 NOTE — Telephone Encounter (Signed)
   Patient Name: Justin [name]  DOB: April 10, 1946 MRN: [id number]  Primary Cardiologist: None  Chart reviewed as part of pre-operative protocol coverage.  Patient is not an active patient with [facility].  I will route this information to the requesting party via Epic fax function and remove from pre-op pool.  Please call with questions.  Lenna Sciara, NP 09/24/2022, 12:27 PM

## 2022-09-25 ENCOUNTER — Emergency Department (HOSPITAL_COMMUNITY)
Admission: EM | Admit: 2022-09-25 | Discharge: 2022-09-25 | Disposition: A | Discharge status: Home / Self Care | Payer: Medicare Other | Attending: Emergency Medicine | Admitting: Emergency Medicine

## 2022-09-25 ENCOUNTER — Encounter (HOSPITAL_COMMUNITY): Payer: Self-pay

## 2022-09-25 ENCOUNTER — Other Ambulatory Visit: Payer: Self-pay

## 2022-09-25 ENCOUNTER — Emergency Department (HOSPITAL_COMMUNITY): Payer: Medicare Other

## 2022-09-25 DIAGNOSIS — Z992 Dependence on renal dialysis: Secondary | ICD-10-CM | POA: Diagnosis not present

## 2022-09-25 DIAGNOSIS — R011 Cardiac murmur, unspecified: Secondary | ICD-10-CM | POA: Insufficient documentation

## 2022-09-25 DIAGNOSIS — Z20822 Contact with and (suspected) exposure to covid-19: Secondary | ICD-10-CM | POA: Insufficient documentation

## 2022-09-25 DIAGNOSIS — R55 Syncope and collapse: Secondary | ICD-10-CM | POA: Insufficient documentation

## 2022-09-25 DIAGNOSIS — E876 Hypokalemia: Secondary | ICD-10-CM | POA: Insufficient documentation

## 2022-09-25 DIAGNOSIS — Z7901 Long term (current) use of anticoagulants: Secondary | ICD-10-CM | POA: Insufficient documentation

## 2022-09-25 LAB — CBC WITH DIFFERENTIAL/PLATELET
Abs Immature Granulocytes: 0.03 10*3/uL (ref 0.00–0.07)
Basophils Absolute: 0 10*3/uL (ref 0.0–0.1)
Basophils Relative: 0 %
Eosinophils Absolute: 0.1 10*3/uL (ref 0.0–0.5)
Eosinophils Relative: 1 %
HCT: 32.7 % — ABNORMAL LOW (ref 39.0–52.0)
Hemoglobin: 10.2 g/dL — ABNORMAL LOW (ref 13.0–17.0)
Immature Granulocytes: 0 %
Lymphocytes Relative: 11 %
Lymphs Abs: 1.2 10*3/uL (ref 0.7–4.0)
MCH: 31.8 pg (ref 26.0–34.0)
MCHC: 31.2 g/dL (ref 30.0–36.0)
MCV: 101.9 fL — ABNORMAL HIGH (ref 80.0–100.0)
Monocytes Absolute: 1 10*3/uL (ref 0.1–1.0)
Monocytes Relative: 9 %
Neutro Abs: 8.5 10*3/uL — ABNORMAL HIGH (ref 1.7–7.7)
Neutrophils Relative %: 79 %
Platelets: 205 10*3/uL (ref 150–400)
RBC: 3.21 MIL/uL — ABNORMAL LOW (ref 4.22–5.81)
RDW: 17 % — ABNORMAL HIGH (ref 11.5–15.5)
WBC: 10.9 10*3/uL — ABNORMAL HIGH (ref 4.0–10.5)
nRBC: 0 % (ref 0.0–0.2)

## 2022-09-25 LAB — COMPREHENSIVE METABOLIC PANEL
ALT: 17 U/L (ref 0–44)
AST: 34 U/L (ref 15–41)
Albumin: 3.2 g/dL — ABNORMAL LOW (ref 3.5–5.0)
Alkaline Phosphatase: 115 U/L (ref 38–126)
Anion gap: 9 (ref 5–15)
BUN: 16 mg/dL (ref 8–23)
CO2: 31 mmol/L (ref 22–32)
Calcium: 8.2 mg/dL — ABNORMAL LOW (ref 8.9–10.3)
Chloride: 98 mmol/L (ref 98–111)
Creatinine, Ser: 5.04 mg/dL — ABNORMAL HIGH (ref 0.61–1.24)
GFR, Estimated: 11 mL/min — ABNORMAL LOW (ref >60)
Glucose, Bld: 193 mg/dL — ABNORMAL HIGH (ref 70–99)
Potassium: 3.8 mmol/L (ref 3.5–5.1)
Sodium: 138 mmol/L (ref 135–145)
Total Bilirubin: 1 mg/dL (ref 0.3–1.2)
Total Protein: 7.8 g/dL (ref 6.5–8.1)

## 2022-09-25 LAB — RESP PANEL BY RT-PCR (RSV, FLU A&B, COVID)  RVPGX2
Influenza A by PCR: NEGATIVE
Influenza B by PCR: NEGATIVE
Resp Syncytial Virus by PCR: NEGATIVE
SARS Coronavirus 2 by RT PCR: NEGATIVE

## 2022-09-25 LAB — POTASSIUM: Potassium: 3.3 mmol/L — ABNORMAL LOW (ref 3.5–5.1)

## 2022-09-25 MED ORDER — SODIUM CHLORIDE 0.9 % IV BOLUS
500.0000 mL | Freq: Once | INTRAVENOUS | Status: AC
  Administered 2022-09-25: 500 mL via INTRAVENOUS

## 2022-09-25 NOTE — ED Notes (Signed)
Pt able to transfer to wheelchair without assistance. Blood pressure remained stable in all positions.

## 2022-09-25 NOTE — Discharge Instructions (Addendum)
It seems he passed out from too much fluid being taken off.  Follow-up with dialysis on Monday, and if you develop any other symptoms such as chest pain, shortness of breath, dizziness or lightheadedness, passing out, or any other new/concerning symptoms then return to the ER or call 911.

## 2022-09-25 NOTE — ED Provider Notes (Signed)
[facility] Provider Note   CSN: [id number] Arrival date & time: 09/25/22  [id number]     History  Chief Complaint  Patient presents with   Loss of Consciousness    Justin [name] is a 77 y.o. male.  HPI 77 year old male presents with syncope at dialysis.  Patient states that he was feeling well when he woke up this morning he did not eat which is typical on dialysis days.  He goes MWF.  He has not missed any recent sessions.  He has been dealing with some GI bleeding over the last several weeks with multiple colonoscopies.  He states since his last ED visit a couple days ago the bleeding has slowed down significantly and "cleared up".  Today he was at dialysis and EMS reports he only received 2 hours and 16 minutes of dialysis.  Patient states he normally goes for 3 hours 45 minutes.  Patient states he started to feel very sleepy and neck thing he knows he was being awoken with cold water.  Patient never had CPR performed.  EMS noted some hypotension in the 80s though when they laid him flat it came up into the low 100s.  No fluids have been given.  Patient states he is feeling okay right now.  His blood pressure typically runs around [id number] systolic per him.  He denies any acute or recent illness otherwise including no headache, chest pain, shortness of breath, new cough, or medication changes.  Home Medications Prior to Admission medications   Medication Sig Start Date End Date Taking? Authorizing Provider  allopurinol (ZYLOPRIM) 100 MG tablet Take 100 mg by mouth daily. 09/13/21   [provider]  AMBULATORY NON FORMULARY MEDICATION Medication Name: Sucralfate 50 mg suppository- Insert 1 suppository into rectum twice daily x 14 days. 09/18/22   Armbruster, Carlota Raspberry, MD  amiodarone (PACERONE) 200 MG tablet Take 1 tablet (200 mg total) by mouth 2 (two) times daily. On 07/09/22, start amiodarone 200 mg once daily Patient taking differently: Take 200  mg by mouth daily. On 07/09/22, start amiodarone 200 mg once daily 06/26/22   Tat, Shanon Brow, MD  atorvastatin (LIPITOR) 40 MG tablet Take 40 mg by mouth daily.  10/30/14   [provider]  cinacalcet (SENSIPAR) 30 MG tablet Take 30 mg by mouth daily. 08/25/22   [provider]  ELIQUIS 5 MG TABS tablet Take 1 tablet (5 mg total) by mouth 2 (two) times daily. Resume Your Eliquis after 5 days 09/23/22   Antonieta Pert, MD  HYDROcodone-acetaminophen (NORCO/VICODIN) 5-325 MG tablet Take 1 tablet by mouth every 6 (six) hours as needed for severe pain. Patient taking differently: Take 1 tablet by mouth daily as needed for moderate pain. 06/26/22   Orson Eva, MD  LOKELMA 10 g PACK packet Take 10 g by mouth 2 (two) times a week. Tuesday and Thursday 06/08/22   [provider]  Methoxy PEG-Epoetin Beta (MIRCERA IJ) Mircera 06/13/21 12/18/22  [provider]  metoprolol tartrate (LOPRESSOR) 25 MG tablet Take 0.5 tablets (12.5 mg total) by mouth 2 (two) times daily. Patient taking differently: Take 25 mg by mouth daily. 06/26/22   Orson Eva, MD  midodrine (PROAMATINE) 5 MG tablet Take 10 mg by mouth 3 (three) times a week. Prior to dialysis M-W-F 08/13/22   [provider]  omeprazole (PRILOSEC) 20 MG capsule Take 20 mg by mouth every morning.     [provider]  pregabalin (LYRICA)  75 MG capsule Take 1 capsule (75 mg total) by mouth 2 (two) times daily. Patient taking differently: Take 75 mg by mouth daily. 06/26/22   Orson Eva, MD  VELPHORO 500 MG chewable tablet Chew 500 mg by mouth 2 (two) times daily with a meal. 06/03/22   [provider]      Allergies    Patient has no known allergies.    Review of Systems   Review of Systems  Constitutional:  Negative for fever.  Respiratory:  Positive for cough (chronic, for months). Negative for shortness of breath.   Cardiovascular:  Negative for chest pain.  Gastrointestinal:  Positive for blood in stool.  Negative for abdominal pain and vomiting.  Neurological:  Positive for syncope. Negative for headaches.    Physical Exam Updated Vital Signs BP 120/69 (BP Location: Right Arm)   Pulse 67   Temp 97.8 F (36.6 C) (Oral)   Resp 14   Ht 5\' 10"  (1.778 m)   Wt 96 kg   SpO2 98%   BMI 30.37 kg/m  Physical Exam Vitals and nursing note reviewed.  Constitutional:      General: He is not in acute distress.    Appearance: He is well-developed. He is not ill-appearing or diaphoretic.  HENT:     Head: Normocephalic and atraumatic.  Eyes:     Extraocular Movements: Extraocular movements intact.     Pupils: Pupils are equal, round, and reactive to light.  Cardiovascular:     Rate and Rhythm: Normal rate and regular rhythm.     Heart sounds: Murmur heard.  Pulmonary:     Effort: Pulmonary effort is normal.     Breath sounds: Normal breath sounds.  Abdominal:     General: There is no distension.     Palpations: Abdomen is soft.     Tenderness: There is no abdominal tenderness.  Skin:    General: Skin is warm and dry.  Neurological:     Mental Status: He is alert and oriented to person, place, and time.     Comments: CN 3-12 grossly intact. 5/5 strength in all 4 extremities.     ED Results / Procedures / Treatments   Labs (all labs ordered are listed, but only abnormal results are displayed) Labs Reviewed  COMPREHENSIVE METABOLIC PANEL - Abnormal; Notable for the following components:      Result Value   Glucose, Bld 193 (*)    Creatinine, Ser 5.04 (*)    Calcium 8.2 (*)    Albumin 3.2 (*)    GFR, Estimated 11 (*)    All other components within normal limits  CBC WITH DIFFERENTIAL/PLATELET - Abnormal; Notable for the following components:   WBC 10.9 (*)    RBC 3.21 (*)    Hemoglobin 10.2 (*)    HCT 32.7 (*)    MCV 101.9 (*)    RDW 17.0 (*)    Neutro Abs 8.5 (*)    All other components within normal limits  POTASSIUM - Abnormal; Notable for the following components:    Potassium 3.3 (*)    All other components within normal limits  RESP PANEL BY RT-PCR (RSV, FLU A&B, COVID)  [id number]  CBC WITH DIFFERENTIAL/PLATELET    EKG EKG Interpretation  Date/Time:  Friday September 25 2022 09:35:28 EDT Ventricular Rate:  72 PR Interval:  47 QRS Duration: 107 QT Interval:  351 QTC Calculation: 385 R Axis:   60 Text Interpretation: Sinus rhythm Short PR interval Nonspecific T abnormalities,  lateral leads no significant change since Sep 22 2022 Confirmed by Sherwood Gambler [phone]) on 09/25/2022 9:36:36 AM  Radiology DG Chest Portable 1 View  Result Date: 09/25/2022 CLINICAL DATA:  Syncope EXAM: PORTABLE CHEST 1 VIEW COMPARISON:  CXR 09/10/22 FINDINGS: No pleural effusion. No pneumothorax. Normal cardiac and mediastinal contours. There are prominent bilateral opacities. There is hazy opacity at the left lung base. No radiographically apparent rib fractures. Visualized upper abdomen is unremarkable. IMPRESSION: 1. Prominent bilateral opacities, which could be due to pulmonary edema or atypical infection. 2. Hazy opacity at the left lung base has increased from prior exam and is suspicious infection. Electronically Signed   By: Marin Roberts M.D.   On: 09/25/2022 09:52    Procedures Procedures    Medications Ordered in ED Medications  sodium chloride 0.9 % bolus 500 mL (0 mLs Intravenous Stopped 09/25/22 1109)    ED Course/ Medical Decision Making/ A&P                             Medical Decision Making Amount and/or Complexity of Data Reviewed Independent Historian: EMS External Data Reviewed: notes. Labs: ordered.    Details: Mild hypokalemia.  Stable hemoglobin. Radiology: ordered and independent interpretation performed.    Details: Questionable pulmonary infiltrates but no overt CHF or pneumonia on my read. ECG/medicine tests: ordered and independent interpretation performed.    Details: No acute ischemia   Patient presents with syncope from dialysis.   Likely related to the amount of fluid removed.  He states that he typically runs low and his blood pressure and after a small bolus his blood pressures have been stable.  I did briefly discussed with Dr. Royce Macadamia of nephrology, recommends he could just follow-up as an outpatient on Monday (today is Friday) for his dialysis.  Patient otherwise feels well enough to go.  His x-ray was concerning for a possible infiltrates versus CHF but he does not appear fluid overloaded on exam and while he has a chronic cough he does not feel like he has any acute symptoms.  He is not having hypoxia, fever, shortness of breath, etc.  I doubt this is pneumonia.  Will discharge home with return precautions.        Final Clinical Impression(s) / ED Diagnoses Final diagnoses:  Syncope, unspecified syncope type    Rx / DC Orders ED Discharge Orders     None         Sherwood Gambler, MD 09/25/22 1551

## 2022-09-25 NOTE — ED Triage Notes (Signed)
Pt to the ed from dialysis via ems with a CC of syncope while getting treatment. Pt was able to complete 2 hours of dialysis. EMS relays pt had low BP, with extremely lethargy. Pt A&Ox4 now, denies any  cp, dizziness at this time.

## 2022-10-21 ENCOUNTER — Ambulatory Visit: Payer: Medicare Other | Admitting: Internal Medicine

## 2022-10-28 ENCOUNTER — Telehealth: Payer: Self-pay | Admitting: Gastroenterology

## 2022-10-28 ENCOUNTER — Telehealth: Payer: Self-pay

## 2022-10-28 NOTE — Telephone Encounter (Signed)
-----   Message from Lanae Boast, MD sent at 10/28/2022  1:41 PM EDT ----- Aundria Rud,  Thanks for your query. It is genuine question.  Unfortunately I was only involved in her care while she was hospitalized and not currently part of her care team. I would strongly advise to forward your questions and concerns to her current treating physician which includes but not limited to primary care physician, cardiology and gastroenterology. I am part of the hospitalist group and seeing the patient while they are acutely ill and hospitalized. Care gets transferred to the primary care once they are discharged.  Please refer to last GI note while she was in hospital back in March which is copied for your convenience:  " RECOMMEND: - continue regular diet - okay to discharge later today after dialysis - hold Eliquis another 5 days if okay per primary service given recurrent bleeding and endoscopic treatment x 2 in the past week - defer to primary team if they wish to give another unit of PRBC during HD - recommend sucralfate suppositories as outpatient - can only be ordered at compounding pharmacy, our office will coordinate - outpatient follow up in our office 10/21/22 at 1110 with Dr. 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 Harlin Rain, MD [facility]"  Best Lanae Boast MD [facility].  ----- Message ----- From: Loretha Stapler, RN Sent: 10/28/2022  10:52 AM EDT To: Lanae Boast, MD

## 2022-10-28 NOTE — Telephone Encounter (Signed)
-----   Message from Loretha Stapler, RN sent at 09/24/2022 12:26 PM EDT ----- Make sure to have Eliquis hold

## 2022-10-28 NOTE — Telephone Encounter (Signed)
Dr Dayna Barker called regarding a letter that was sent to him on patient behalf. He states that he is no longer on Patient care team and should be routed elsewhere. Please advise.   Thank you.

## 2022-10-28 NOTE — Telephone Encounter (Signed)
I have resent a letter to the pt MD that prescribes his Eliquis.

## 2022-10-28 NOTE — Telephone Encounter (Signed)
I have sent the request to Dr Hart Robinsons the  pt PCP.  Will await response

## 2022-10-29 ENCOUNTER — Ambulatory Visit: Payer: Medicare Other | Admitting: Internal Medicine

## 2022-11-02 ENCOUNTER — Telehealth: Payer: Self-pay

## 2022-11-02 NOTE — Telephone Encounter (Signed)
Called and spoke with the patient. Patient tells me that he does not know what procedure I am calling about. He states, "I am in the hospital right now." Patient reports he is in "a hospital in [place]." He also tells me that Rolly Pancake "drives me to my appointments." Called the phone number in the chart listed as Rolly Pancake (niece) and a male answers the phone. He does not respond but also does not hang up the phone. Called the sister of the patient listed in the chart, Lynne Logan, and no answer. No designated party release found in chart. Did not call the listed "significant other."

## 2022-11-02 NOTE — Telephone Encounter (Signed)
-----   Message from Imogene Burn, MD sent at 10/30/2022 12:10 PM EDT ----- Elio Forget, I see that Mr. Cervi has missed two clinic appts with me, but Gabe wanted him to be seen before his upcoming EUS/colonoscopy procedure. Would be okay to overbook my clinic next week to get him in. Or alternatively if there is an APP appt available before his procedure on 5/2, then please schedule him for an appt with APP.

## 2022-11-03 NOTE — Telephone Encounter (Signed)
Hi Justin [name], I had previously talked to you about this patient who is scheduled for upper EUS for gastric submucosal lesion and colonoscopy for large polyp EMR on 5/2 with you. He has missed his last two clinic appointments with me, and I can see that he is currently hospitalized at [facility] in [facility] for respiratory failure and septic shock due to presumed cholecystitis, which was treated with a cholecystectomy tube. It looks like there is also concern for a NSTEMI. I think that we will need to cancel his upcoming hospital procedures with you and re-evaluate him in clinic before doing his procedures.   [name], let's plan to see him in GI clinic in 6 weeks (would be okay to schedule with APP), hopefully he will be out of the hospital by then.

## 2022-11-03 NOTE — Telephone Encounter (Signed)
CD, Sorry to hear about this. Does make sense since it looks like he has had a lot of new things that have developed that he needs to be seen before we find any other procedures. We will cancel his procedures until we hear back from you. GM

## 2022-11-04 NOTE — Telephone Encounter (Signed)
Pt has been cancelled.

## 2022-11-08 ENCOUNTER — Inpatient Hospital Stay (HOSPITAL_COMMUNITY)
Admission: EM | Admit: 2022-11-08 | Discharge: 2022-11-24 | DRG: 377 | Disposition: A | Discharge status: Skilled Nursing Facility | Payer: Medicare Other | Source: Skilled Nursing Facility | Attending: Internal Medicine | Admitting: Internal Medicine

## 2022-11-08 ENCOUNTER — Encounter (HOSPITAL_COMMUNITY): Payer: Self-pay

## 2022-11-08 ENCOUNTER — Emergency Department (HOSPITAL_COMMUNITY): Payer: Medicare Other

## 2022-11-08 ENCOUNTER — Other Ambulatory Visit: Payer: Self-pay

## 2022-11-08 DIAGNOSIS — N186 End stage renal disease: Secondary | ICD-10-CM

## 2022-11-08 DIAGNOSIS — D6832 Hemorrhagic disorder due to extrinsic circulating anticoagulants: Secondary | ICD-10-CM | POA: Diagnosis present

## 2022-11-08 DIAGNOSIS — G9341 Metabolic encephalopathy: Secondary | ICD-10-CM | POA: Diagnosis present

## 2022-11-08 DIAGNOSIS — J189 Pneumonia, unspecified organism: Secondary | ICD-10-CM | POA: Diagnosis present

## 2022-11-08 DIAGNOSIS — Z9981 Dependence on supplemental oxygen: Secondary | ICD-10-CM

## 2022-11-08 DIAGNOSIS — K5521 Angiodysplasia of colon with hemorrhage: Principal | ICD-10-CM | POA: Diagnosis present

## 2022-11-08 DIAGNOSIS — M898X9 Other specified disorders of bone, unspecified site: Secondary | ICD-10-CM | POA: Diagnosis present

## 2022-11-08 DIAGNOSIS — G9340 Encephalopathy, unspecified: Secondary | ICD-10-CM | POA: Diagnosis present

## 2022-11-08 DIAGNOSIS — Z7901 Long term (current) use of anticoagulants: Secondary | ICD-10-CM

## 2022-11-08 DIAGNOSIS — K219 Gastro-esophageal reflux disease without esophagitis: Secondary | ICD-10-CM | POA: Diagnosis present

## 2022-11-08 DIAGNOSIS — R55 Syncope and collapse: Principal | ICD-10-CM

## 2022-11-08 DIAGNOSIS — I953 Hypotension of hemodialysis: Secondary | ICD-10-CM | POA: Diagnosis not present

## 2022-11-08 DIAGNOSIS — I350 Nonrheumatic aortic (valve) stenosis: Secondary | ICD-10-CM | POA: Diagnosis present

## 2022-11-08 DIAGNOSIS — I480 Paroxysmal atrial fibrillation: Secondary | ICD-10-CM | POA: Diagnosis present

## 2022-11-08 DIAGNOSIS — Z860100 Personal history of colon polyps, unspecified: Secondary | ICD-10-CM

## 2022-11-08 DIAGNOSIS — J9611 Chronic respiratory failure with hypoxia: Secondary | ICD-10-CM | POA: Diagnosis present

## 2022-11-08 DIAGNOSIS — Z6830 Body mass index (BMI) 30.0-30.9, adult: Secondary | ICD-10-CM

## 2022-11-08 DIAGNOSIS — E669 Obesity, unspecified: Secondary | ICD-10-CM | POA: Diagnosis present

## 2022-11-08 DIAGNOSIS — Z8546 Personal history of malignant neoplasm of prostate: Secondary | ICD-10-CM

## 2022-11-08 DIAGNOSIS — D62 Acute posthemorrhagic anemia: Secondary | ICD-10-CM | POA: Diagnosis present

## 2022-11-08 DIAGNOSIS — D649 Anemia, unspecified: Secondary | ICD-10-CM

## 2022-11-08 DIAGNOSIS — K317 Polyp of stomach and duodenum: Secondary | ICD-10-CM | POA: Diagnosis present

## 2022-11-08 DIAGNOSIS — K3189 Other diseases of stomach and duodenum: Secondary | ICD-10-CM

## 2022-11-08 DIAGNOSIS — K922 Gastrointestinal hemorrhage, unspecified: Secondary | ICD-10-CM | POA: Diagnosis present

## 2022-11-08 DIAGNOSIS — D631 Anemia in chronic kidney disease: Secondary | ICD-10-CM | POA: Diagnosis present

## 2022-11-08 DIAGNOSIS — Z79899 Other long term (current) drug therapy: Secondary | ICD-10-CM

## 2022-11-08 DIAGNOSIS — K449 Diaphragmatic hernia without obstruction or gangrene: Secondary | ICD-10-CM | POA: Diagnosis present

## 2022-11-08 DIAGNOSIS — K635 Polyp of colon: Secondary | ICD-10-CM

## 2022-11-08 DIAGNOSIS — Z992 Dependence on renal dialysis: Secondary | ICD-10-CM

## 2022-11-08 DIAGNOSIS — Z8719 Personal history of other diseases of the digestive system: Secondary | ICD-10-CM

## 2022-11-08 DIAGNOSIS — Z1152 Encounter for screening for COVID-19: Secondary | ICD-10-CM

## 2022-11-08 DIAGNOSIS — M109 Gout, unspecified: Secondary | ICD-10-CM | POA: Diagnosis present

## 2022-11-08 DIAGNOSIS — J810 Acute pulmonary edema: Secondary | ICD-10-CM

## 2022-11-08 DIAGNOSIS — E871 Hypo-osmolality and hyponatremia: Secondary | ICD-10-CM | POA: Diagnosis not present

## 2022-11-08 DIAGNOSIS — E1140 Type 2 diabetes mellitus with diabetic neuropathy, unspecified: Secondary | ICD-10-CM | POA: Diagnosis present

## 2022-11-08 DIAGNOSIS — I440 Atrioventricular block, first degree: Secondary | ICD-10-CM | POA: Diagnosis present

## 2022-11-08 DIAGNOSIS — K627 Radiation proctitis: Secondary | ICD-10-CM

## 2022-11-08 DIAGNOSIS — I120 Hypertensive chronic kidney disease with stage 5 chronic kidney disease or end stage renal disease: Secondary | ICD-10-CM | POA: Diagnosis present

## 2022-11-08 DIAGNOSIS — Z841 Family history of disorders of kidney and ureter: Secondary | ICD-10-CM

## 2022-11-08 DIAGNOSIS — E785 Hyperlipidemia, unspecified: Secondary | ICD-10-CM | POA: Diagnosis present

## 2022-11-08 DIAGNOSIS — Z87891 Personal history of nicotine dependence: Secondary | ICD-10-CM

## 2022-11-08 DIAGNOSIS — G928 Other toxic encephalopathy: Secondary | ICD-10-CM | POA: Diagnosis not present

## 2022-11-08 DIAGNOSIS — I252 Old myocardial infarction: Secondary | ICD-10-CM

## 2022-11-08 DIAGNOSIS — E1122 Type 2 diabetes mellitus with diabetic chronic kidney disease: Secondary | ICD-10-CM | POA: Diagnosis present

## 2022-11-08 DIAGNOSIS — K633 Ulcer of intestine: Secondary | ICD-10-CM

## 2022-11-08 DIAGNOSIS — N2581 Secondary hyperparathyroidism of renal origin: Secondary | ICD-10-CM | POA: Diagnosis present

## 2022-11-08 DIAGNOSIS — Z7982 Long term (current) use of aspirin: Secondary | ICD-10-CM

## 2022-11-08 DIAGNOSIS — K921 Melena: Secondary | ICD-10-CM

## 2022-11-08 DIAGNOSIS — E1159 Type 2 diabetes mellitus with other circulatory complications: Principal | ICD-10-CM

## 2022-11-08 DIAGNOSIS — E119 Type 2 diabetes mellitus without complications: Secondary | ICD-10-CM

## 2022-11-08 DIAGNOSIS — I959 Hypotension, unspecified: Secondary | ICD-10-CM | POA: Diagnosis present

## 2022-11-08 LAB — CBC WITH DIFFERENTIAL/PLATELET
Abs Immature Granulocytes: 0.09 10*3/uL — ABNORMAL HIGH (ref 0.00–0.07)
Basophils Absolute: 0 10*3/uL (ref 0.0–0.1)
Basophils Relative: 0 %
Eosinophils Absolute: 0.1 10*3/uL (ref 0.0–0.5)
Eosinophils Relative: 1 %
HCT: 23 % — ABNORMAL LOW (ref 39.0–52.0)
Hemoglobin: 7.4 g/dL — ABNORMAL LOW (ref 13.0–17.0)
Immature Granulocytes: 1 %
Lymphocytes Relative: 13 %
Lymphs Abs: 1.2 10*3/uL (ref 0.7–4.0)
MCH: 33.6 pg (ref 26.0–34.0)
MCHC: 32.2 g/dL (ref 30.0–36.0)
MCV: 104.5 fL — ABNORMAL HIGH (ref 80.0–100.0)
Monocytes Absolute: 1 10*3/uL (ref 0.1–1.0)
Monocytes Relative: 11 %
Neutro Abs: 7.1 10*3/uL (ref 1.7–7.7)
Neutrophils Relative %: 74 %
Platelets: 175 10*3/uL (ref 150–400)
RBC: 2.2 MIL/uL — ABNORMAL LOW (ref 4.22–5.81)
RDW: 20.2 % — ABNORMAL HIGH (ref 11.5–15.5)
WBC: 9.6 10*3/uL (ref 4.0–10.5)
nRBC: 0 % (ref 0.0–0.2)

## 2022-11-08 LAB — COMPREHENSIVE METABOLIC PANEL
ALT: 32 U/L (ref 0–44)
AST: 27 U/L (ref 15–41)
Albumin: 2 g/dL — ABNORMAL LOW (ref 3.5–5.0)
Alkaline Phosphatase: 84 U/L (ref 38–126)
Anion gap: 16 — ABNORMAL HIGH (ref 5–15)
BUN: 42 mg/dL — ABNORMAL HIGH (ref 8–23)
CO2: 24 mmol/L (ref 22–32)
Calcium: 8.8 mg/dL — ABNORMAL LOW (ref 8.9–10.3)
Chloride: 98 mmol/L (ref 98–111)
Creatinine, Ser: 8.41 mg/dL — ABNORMAL HIGH (ref 0.61–1.24)
GFR, Estimated: 6 mL/min — ABNORMAL LOW (ref >60)
Glucose, Bld: 154 mg/dL — ABNORMAL HIGH (ref 70–99)
Potassium: 4.3 mmol/L (ref 3.5–5.1)
Sodium: 138 mmol/L (ref 135–145)
Total Bilirubin: 1 mg/dL (ref 0.3–1.2)
Total Protein: 6.8 g/dL (ref 6.5–8.1)

## 2022-11-08 LAB — TROPONIN I (HIGH SENSITIVITY)
Troponin I (High Sensitivity): 40 ng/L — ABNORMAL HIGH (ref <18)
Troponin I (High Sensitivity): 48 ng/L — ABNORMAL HIGH (ref <18)

## 2022-11-08 NOTE — ED Provider Notes (Addendum)
EMERGENCY DEPARTMENT AT [facility] Provider Note   CSN: [id number] Arrival date & time: 11/08/22  1732     History  Chief Complaint  Patient presents with   Loss of Consciousness    Justin [name] is a 77 y.o. male.  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  Patient reports since getting home from the hospital he has been on continuous oxygen and has had a persistent cough.  He has been going to dialysis regularly Monday Wednesday Friday and is due for treatment tomorrow.  He was talking with his granddaughter today at the facility when she reports he suddenly just lost consciousness for several minutes.  Facility staff performed CPR for approximately 4 minutes and then patient came around.  Blood sugar was normal at that time.  Patient reports he does not know what happened he had no warning that anything was wrong and reports now he feels about the same.  No prior history of syncope.  He continues to have drainage from the biliary drain but the drainage color has not changed.  He has not had any abdominal pain nausea or vomiting he denies any chest pain but does complain of persistent shortness of breath.  Patient is still currently on Eliquis.  He did have a lot of medications discontinued prior to his discharge from the hospital but has continued on the ones they wanted him to be on since discharge.  He is also noticed more swelling in his feet bilaterally recently.  Last EF was  65%.  The history is provided by the patient, medical records and a relative.  Loss of Consciousness      Home Medications Prior to Admission medications   Medication Sig Start Date End Date Taking? Authorizing Provider  allopurinol (ZYLOPRIM) 100 MG tablet Take 100 mg by mouth daily. 09/13/21   [provider]  AMBULATORY NON FORMULARY MEDICATION Medication Name: Sucralfate 50 mg suppository- Insert 1 suppository into rectum twice daily x 14 days. 09/18/22   Armbruster, Willaim Rayas, MD  amiodarone (PACERONE) 200 MG tablet Take 1 tablet (200 mg total) by mouth 2 (two) times daily. On 07/09/22, start amiodarone 200 mg once daily Patient taking differently: Take 200 mg by mouth daily. On 07/09/22, start amiodarone 200 mg once daily 06/26/22   Tat, Onalee Hua, MD  atorvastatin (LIPITOR) 40 MG tablet Take 40 mg by mouth daily.  10/30/14   [provider]  cinacalcet (SENSIPAR) 30 MG tablet Take 30 mg by mouth daily. 08/25/22   [provider]  ELIQUIS 5 MG TABS tablet Take 1 tablet (5 mg total) by mouth 2 (two) times daily. Resume Your Eliquis after 5 days 09/23/22   Lanae Boast, MD  HYDROcodone-acetaminophen (NORCO/VICODIN) 5-325 MG tablet Take 1 tablet by mouth every 6 (six) hours as needed for severe pain. Patient taking differently: Take 1 tablet by mouth daily as needed for moderate pain. 06/26/22   Catarina Hartshorn, MD  LOKELMA 10 g PACK packet Take 10 g by mouth 2 (two) times a week. Tuesday and  Thursday 06/08/22   [provider]  Methoxy PEG-Epoetin Beta (MIRCERA IJ) Mircera 06/13/21 12/18/22  [provider]  metoprolol tartrate (LOPRESSOR) 25 MG tablet Take 0.5 tablets (12.5 mg total) by mouth 2 (two) times daily. Patient taking differently: Take 25 mg by mouth daily. 06/26/22   Catarina Hartshorn, MD  midodrine (PROAMATINE) 5 MG tablet Take 10 mg by mouth 3 (three) times a week. Prior to dialysis M-W-F 08/13/22   [provider]  omeprazole (PRILOSEC) 20 MG capsule  Take 20 mg by mouth every morning.     [provider]  pregabalin (LYRICA) 75 MG capsule Take 1 capsule (75 mg total) by mouth 2 (two) times daily. Patient taking differently: Take 75 mg by mouth daily. 06/26/22   Catarina Hartshorn, MD  VELPHORO 500 MG chewable tablet Chew 500 mg by mouth 2 (two) times daily with a meal. 06/03/22   [provider]      Allergies    Patient has no known allergies.    Review of Systems   Review of Systems  Cardiovascular:  Positive for syncope.    Physical Exam Updated Vital Signs BP (!) 153/105 (BP Location: Right Wrist)   Pulse (!) 108   Temp 98.1 F (36.7 C) (Oral)   Resp (!) 21   SpO2 100%  Physical Exam Vitals and nursing note reviewed.  Constitutional:      General: He is not in acute distress.    Appearance: He is well-developed. He is ill-appearing.  HENT:     Head: Normocephalic and atraumatic.     Mouth/Throat:     Mouth: Mucous membranes are moist.  Eyes:     Conjunctiva/sclera: Conjunctivae normal.     Pupils: Pupils are equal, round, and reactive to light.  Cardiovascular:     Rate and Rhythm: Regular rhythm. Tachycardia present.     Heart sounds: No murmur heard. Pulmonary:     Effort: Pulmonary effort is normal. No respiratory distress.     Breath sounds: Rales present. No wheezing.  Abdominal:     General: There is no distension.     Palpations: Abdomen is soft.     Tenderness: There is no abdominal tenderness. There is no guarding or rebound.     Comments: Biliary drain in the right upper quadrant with yellow/greenish fluid.  Abdomen is nontender  Musculoskeletal:        General: No tenderness. Normal range of motion.     Cervical back: Normal range of motion and neck supple.     Right lower leg: Edema present.     Left lower leg: Edema present.     Comments: Graft in the left upper arm with palpable thrill  Skin:    General: Skin is warm and dry.     Findings: No erythema or rash.  Neurological:      Mental Status: He is alert and oriented to person, place, and time.  Psychiatric:        Behavior: Behavior normal.     ED Results / Procedures / Treatments   Labs (all labs ordered are listed, but only abnormal results are displayed) Labs Reviewed  CBC WITH DIFFERENTIAL/PLATELET - Abnormal; Notable for the following components:      Result Value   RBC 2.20 (*)    Hemoglobin 7.4 (*)    HCT 23.0 (*)    MCV 104.5 (*)    RDW 20.2 (*)    Abs Immature Granulocytes 0.09 (*)    All  other components within normal limits  COMPREHENSIVE METABOLIC PANEL - Abnormal; Notable for the following components:   Glucose, Bld 154 (*)    BUN 42 (*)    Creatinine, Ser 8.41 (*)    Calcium 8.8 (*)    Albumin 2.0 (*)    GFR, Estimated 6 (*)    Anion gap 16 (*)    All other components within normal limits  TROPONIN I (HIGH SENSITIVITY) - Abnormal; Notable for the following components:   Troponin I (High Sensitivity) 40 (*)    All other components within normal limits  TROPONIN I (HIGH SENSITIVITY) - Abnormal; Notable for the following components:   Troponin I (High Sensitivity) 48 (*)    All other components within normal limits    EKG EKG Interpretation  Date/Time:  Sunday November 08 2022 20:51:26 EDT Ventricular Rate:  107 PR Interval:  188 QRS Duration: 95 QT Interval:  387 QTC Calculation: 517 R Axis:   69 Text Interpretation: Sinus tachycardia Nonspecific T abnormalities, lateral leads Minimal ST elevation, inferior leads new  Prolonged QT interval Confirmed by Gwyneth Sprout ([id number]) on 11/08/2022 9:42:47 PM  Radiology DG Chest Port 1 View  Result Date: 11/08/2022 CLINICAL DATA:  Syncope EXAM: PORTABLE CHEST 1 VIEW COMPARISON:  09/25/2022 FINDINGS: Mild cardiomegaly. Worsened bilateral lower lung airspace disease. Possible small left effusion. Aortic atherosclerosis. No pneumothorax. Left axillary vascular stent IMPRESSION: Worsened bilateral lower lung airspace disease which may be  due to atelectasis or pneumonia. Possible small left effusion. Mild cardiomegaly. Electronically Signed   By: Jasmine Pang M.D.   On: 11/08/2022 19:28    Procedures Procedures    Medications Ordered in ED Medications - No data to display  ED Course/ Medical Decision Making/ A&P                             Medical Decision Making Amount and/or Complexity of Data Reviewed Independent Historian:     Details: Family members  External Data Reviewed: notes. Labs: ordered. Decision-making details documented in ED Course. Radiology: ordered and independent interpretation performed. Decision-making details documented in ED Course. ECG/medicine tests: ordered and independent interpretation performed. Decision-making details documented in ED Course.  Risk Decision regarding hospitalization.   Pt with multiple medical problems and comorbidities and presenting today with a complaint that caries a high risk for morbidity and mortality.  Patient presenting today after a syncopal event at his rehab facility.  No prodromal symptoms.  He received 4 minutes of CPR and then came around.  Currently complaining of shortness of breath and has signs of fluid overload on exam.  Next dialysis is on Monday.  Patient sats are 100% on his home oxygen that he left the hospital with.  No evidence of acute abdominal issues at this time.  Patient is mildly tachycardic today but lower suspicion for PE as he is on anticoagulation at this time.  Concern for fluid overload versus infectious etiology.  I have independently visualized and interpreted pt's images today.  This x-ray today with worsening lower airspace disease which radiology reports could be atelectasis or pneumonia also small pleural effusion and mild cardiomegaly.  10:10 PM I independently patient's EKG which shows a sinus tachycardia with new prolonged QT nonspecific ST changes which are unchanged from prior.  CMP with findings of end-stage renal disease  but no acute findings, troponin 40 and 48, CBC with hemoglobin of 7.4 which is down from 10 prior history of  GI bleeding and stool is black today. [facility] has seen him the past and will consult.  He reports he has not had a blood transfusion recently.  He also reports at dialysis he has noticed worsening swelling in his legs and some worsening shortness of breath but they are not pulling any fluid off and just filtering him.  He reports he goes and at the same weight he comes out as and he is asked them to pull fluid and they tell him he they cannot.  She is not having any symptoms of pneumonia today and this is confirmed by family.  He has been afebrile.  However does appear fluid overloaded and with anemia and syncope without prodromal symptoms feel that it would be best for him to be admitted, get dialysis tomorrow, confirm that he does not need blood transfusion and has 24 hours of cardiac monitoring.  Will consult hospitalist for admission.  Patient and family are comfortable with this plan.           Final Clinical Impression(s) / ED Diagnoses Final diagnoses:  Syncope and collapse  Anemia, unspecified type  Acute pulmonary edema (HCC)  ESRD (end stage renal disease) on dialysis [facility])    Rx / DC Orders ED Discharge Orders     None         Gwyneth Sprout, MD 11/08/22 2210    Gwyneth Sprout, MD 11/08/22 2340

## 2022-11-08 NOTE — H&P (Signed)
History and Physical    Justin [name]:[id number] DOB: 01-05-1946 DOA: 11/08/2022  PCP: [name], DO  Patient coming from: {Blank single:19197::"Home","SNF","ALF/ILF","Group Home","BHH","CIR","Hospice","Homeless","[facility] ED","[facility] ED","Outside Hospital","***"}  Chief Complaint: ***  HPI: Justin [name] is a 77 y.o. 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  Patient was recently admitted to [facility] 4/2-4/23 for acute cholecystitis/septic shock and NSTEMI.  He was initially in the ICU requiring vasopressor support.  General surgery had decided against surgical intervention due to patient's history of aortic stenosis.  He was treated with antibiotics and underwent cholecystectomy tube placement by IR.  Per review of discharge summary, had biopsy of stomach/fundus mass done at [facility] and reportedly results inconclusive per pathologist who recommended deeper biopsy samples in the future.  Ultimately discharged to SNF with cholecystectomy tube in place.  He was requiring supplemental oxygen at the time of discharge.    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) 12/07/2019    Dialysis patient (HCC)    Gout    Hypercalcemia 11/14/2021   Hyperkalemia 10/19/2018   Hypertension    Hypotension 06/13/2022   Right posterior capsular opacification 02/27/2021   Septic shock (HCC)    Vitreous hemorrhage of right eye (HCC) 10/24/2019    Past Surgical History:  Procedure Laterality Date   A/V FISTULAGRAM Left 08/17/2017   Procedure: A/V FISTULAGRAM;  Surgeon: Renford Dills, MD;  Location: [facility] INVASIVE CV LAB;  Service: Cardiovascular;  Laterality: Left;   A/V SHUNT INTERVENTION N/A 08/17/2017   Procedure: A/V SHUNT INTERVENTION;  Surgeon: Renford Dills, MD;  Location: [facility] INVASIVE CV LAB;  Service: Cardiovascular;  Laterality: N/A;   AV FISTULA PLACEMENT Left 05/31/2015   Procedure: ARTERIOVENOUS (AV) FISTULA CREATION;  Surgeon: Renford Dills, MD;  Location: [facility] ORS;  Service: Vascular;  Laterality: Left;   COLONOSCOPY WITH PROPOFOL N/A 09/11/2022   Procedure: COLONOSCOPY WITH PROPOFOL;  Surgeon: Imogene Burn, MD;  Location: [facility] ENDOSCOPY;  Service: Gastroenterology;  Laterality: N/A;   COLONOSCOPY WITH PROPOFOL N/A 09/17/2022   Procedure: COLONOSCOPY WITH PROPOFOL;  Surgeon: Benancio Deeds, MD;  Location: [facility] ENDOSCOPY;  Service: Gastroenterology;  Laterality: N/A;   ESOPHAGOGASTRODUODENOSCOPY (EGD) WITH PROPOFOL N/A 09/11/2022   Procedure: ESOPHAGOGASTRODUODENOSCOPY (EGD) WITH PROPOFOL;  Surgeon: Imogene Burn, MD;  Location: [facility] ENDOSCOPY;  Service: Gastroenterology;  Laterality: N/A;   excision bone spurs Bilateral 1989   feet   HEMOSTASIS CLIP PLACEMENT  09/11/2022   Procedure: HEMOSTASIS CLIP PLACEMENT;  Surgeon: Imogene Burn, MD;  Location: [facility] ENDOSCOPY;  Service: Gastroenterology;;   HEMOSTASIS CLIP PLACEMENT  09/17/2022   Procedure: HEMOSTASIS CLIP PLACEMENT;  Surgeon: Benancio Deeds, MD;  Location: [facility] ENDOSCOPY;  Service: Gastroenterology;;   HOT HEMOSTASIS N/A  09/11/2022   Procedure: HOT HEMOSTASIS (ARGON PLASMA COAGULATION/BICAP);  Surgeon:  Imogene Burn, MD;  Location: [facility] ENDOSCOPY;  Service: Gastroenterology;  Laterality: N/A;   HOT HEMOSTASIS N/A 09/17/2022   Procedure: HOT HEMOSTASIS (ARGON PLASMA COAGULATION/BICAP);  Surgeon: Benancio Deeds, MD;  Location: [facility] ENDOSCOPY;  Service: Gastroenterology;  Laterality: N/A;   INCISION AND DRAINAGE PERIRECTAL ABSCESS N/A 06/15/2022   Procedure: IRRIGATION AND DEBRIDEMENT PERIRECTAL ABSCESS;  Surgeon: Franky Macho, MD;  Location: [facility] ORS;  Service: General;  Laterality: N/A;   KNEE SURGERY Left 1998   arthroscopy   POLYPECTOMY  09/11/2022   Procedure: POLYPECTOMY;  Surgeon: Imogene Burn, MD;  Location: [facility] ENDOSCOPY;  Service: Gastroenterology;;   SHOULDER SURGERY Left 1994   rotator cuff     reports that he quit smoking about 44 years ago. His smoking use included cigarettes. He has never used smokeless tobacco. He reports that he does not drink alcohol and does not use drugs.  No Known Allergies  Family History  Problem Relation Age of Onset   Kidney disease Mother    Alcoholism Father    Alcoholism Brother     Prior to Admission medications   Medication Sig Start Date End Date Taking? Authorizing Provider  allopurinol (ZYLOPRIM) 100 MG tablet Take 100 mg by mouth daily. 09/13/21   [provider]  AMBULATORY NON FORMULARY MEDICATION Medication Name: Sucralfate 50 mg suppository- Insert 1 suppository into rectum twice daily x 14 days. 09/18/22   Armbruster, Willaim Rayas, MD  amiodarone (PACERONE) 200 MG tablet Take 1 tablet (200 mg total) by mouth 2 (two) times daily. On 07/09/22, start amiodarone 200 mg once daily Patient taking differently: Take 200 mg by mouth daily. On 07/09/22, start amiodarone 200 mg once daily 06/26/22   Tat, Onalee Hua, MD  atorvastatin (LIPITOR) 40 MG tablet Take 40 mg by mouth daily.  10/30/14   [provider]  cinacalcet (SENSIPAR) 30 MG tablet Take 30 mg by mouth daily. 08/25/22   [provider]  ELIQUIS 5 MG TABS tablet Take 1  tablet (5 mg total) by mouth 2 (two) times daily. Resume Your Eliquis after 5 days 09/23/22   Lanae Boast, MD  HYDROcodone-acetaminophen (NORCO/VICODIN) 5-325 MG tablet Take 1 tablet by mouth every 6 (six) hours as needed for severe pain. Patient taking differently: Take 1 tablet by mouth daily as needed for moderate pain. 06/26/22   Catarina Hartshorn, MD  LOKELMA 10 g PACK packet Take 10 g by mouth 2 (two) times a week. Tuesday and Thursday 06/08/22   [provider]  Methoxy PEG-Epoetin Beta (MIRCERA IJ) Mircera 06/13/21 12/18/22  [provider]  metoprolol tartrate (LOPRESSOR) 25 MG tablet Take 0.5 tablets (12.5 mg total) by mouth 2 (two) times daily. Patient taking differently: Take 25 mg by mouth daily. 06/26/22   Catarina Hartshorn, MD  midodrine (PROAMATINE) 5 MG tablet Take 10 mg by mouth 3 (three) times a week. Prior to dialysis M-W-F 08/13/22   [provider]  omeprazole (PRILOSEC) 20 MG capsule Take 20 mg by mouth every morning.     [provider]  pregabalin (LYRICA) 75 MG capsule Take 1 capsule (75 mg total) by mouth 2 (two) times daily. Patient taking differently: Take 75 mg by mouth daily. 06/26/22   Catarina Hartshorn, MD  VELPHORO 500 MG chewable tablet Chew 500 mg by mouth 2 (two) times daily with a meal. 06/03/22   [provider]    Physical Exam: Vitals:   11/08/22  1800 11/08/22 1800 11/08/22 1815 11/08/22 2144  BP:   106/81 (!) 153/105  Pulse:  (!) 106  (!) 108  Resp:   11 (!) 21  Temp:    98.1 F (36.7 C)  TempSrc:    Oral  SpO2: 100%   100%    Physical Exam  Labs on Admission: I have personally reviewed following labs and imaging studies  CBC: Recent Labs  Lab 11/08/22 1845  WBC 9.6  NEUTROABS 7.1  HGB 7.4*  HCT 23.0*  MCV 104.5*  PLT 175   Basic Metabolic Panel: Recent Labs  Lab 11/08/22 2020  NA 138  K 4.3  CL 98  CO2 24  GLUCOSE 154*  BUN 42*  CREATININE 8.41*  CALCIUM 8.8*   GFR: CrCl cannot be calculated (Unknown  ideal weight.). Liver Function Tests: Recent Labs  Lab 11/08/22 2020  AST 27  ALT 32  ALKPHOS 84  BILITOT 1.0  PROT 6.8  ALBUMIN 2.0*   No results for input(s): "LIPASE", "AMYLASE" in the last 168 hours. No results for input(s): "AMMONIA" in the last 168 hours. Coagulation Profile: No results for input(s): "INR", "PROTIME" in the last 168 hours. Cardiac Enzymes: No results for input(s): "CKTOTAL", "CKMB", "CKMBINDEX", "TROPONINI" in the last 168 hours. BNP (last 3 results) No results for input(s): "PROBNP" in the last 8760 hours. HbA1C: No results for input(s): "HGBA1C" in the last 72 hours. CBG: No results for input(s): "GLUCAP" in the last 168 hours. Lipid Profile: No results for input(s): "CHOL", "HDL", "LDLCALC", "TRIG", "CHOLHDL", "LDLDIRECT" in the last 72 hours. Thyroid Function Tests: No results for input(s): "TSH", "T4TOTAL", "FREET4", "T3FREE", "THYROIDAB" in the last 72 hours. Anemia Panel: No results for input(s): "VITAMINB12", "FOLATE", "FERRITIN", "TIBC", "IRON", "RETICCTPCT" in the last 72 hours. Urine analysis:    Component Value Date/Time   COLORURINE AMBER (A) 05/27/2020 1451   APPEARANCEUR CLOUDY (A) 05/27/2020 1451   LABSPEC 1.017 05/27/2020 1451   PHURINE 6.0 05/27/2020 1451   GLUCOSEU 50 (A) 05/27/2020 1451   HGBUR LARGE (A) 05/27/2020 1451   BILIRUBINUR NEGATIVE 05/27/2020 1451   KETONESUR NEGATIVE 05/27/2020 1451   PROTEINUR 100 (A) 05/27/2020 1451   UROBILINOGEN 0.2 08/02/2010 1534   NITRITE NEGATIVE 05/27/2020 1451   LEUKOCYTESUR SMALL (A) 05/27/2020 1451    Radiological Exams on Admission: DG Chest Port 1 View  Result Date: 11/08/2022 CLINICAL DATA:  Syncope EXAM: PORTABLE CHEST 1 VIEW COMPARISON:  09/25/2022 FINDINGS: Mild cardiomegaly. Worsened bilateral lower lung airspace disease. Possible small left effusion. Aortic atherosclerosis. No pneumothorax. Left axillary vascular stent IMPRESSION: Worsened bilateral lower lung airspace disease  which may be due to atelectasis or pneumonia. Possible small left effusion. Mild cardiomegaly. Electronically Signed   By: Jasmine Pang M.D.   On: 11/08/2022 19:28    EKG: Independently reviewed. ***  Assessment and Plan   Recommendations to physicians: FU with GI for stomach mass, FU with IR to change out gallbladder drain May 17th at [facility], FU with surgery to discuss cholecystectomy, continue HD, try to wean off Juncal   DVT prophylaxis: {Blank single:19197::"Lovenox","SQ Heparin","IV heparin gtt","Xarelto","Eliquis","Coumadin","SCDs","***"} Code Status: {Blank single:19197::"Full Code","DNR","DNR/DNI","Comfort Care","***"} Family Communication: ***  Consults called: ***  Level of care: {Blank single:19197::"Med-Surg","Telemetry bed","Progressive Care Unit","Step Down Unit"} Admission status: *** Time Spent: 75+ minutes***  John Giovanni MD [facility]  If 7PM-7AM, please contact night-coverage [email or web address]  11/08/2022, 10:29 PM

## 2022-11-08 NOTE — ED Triage Notes (Addendum)
Pt BIB GEMS from [facility] facility d/t a syncopal episode. Pt was conversing w his daughter and granddaughter, and all of the sudden lost consciousness. Nursing home performed CPR pt for about 4 mins. PT'S initial sat was in the 70s on RA, EMS gave 1 neb treatment and placed the pt on NRB. HR been in the 100s w EMS en route. Pt is a dialysis pt, MONDAY, Wednesdays and Fridays. A&O X4.

## 2022-11-09 ENCOUNTER — Inpatient Hospital Stay (HOSPITAL_COMMUNITY): Payer: Medicare Other

## 2022-11-09 DIAGNOSIS — J189 Pneumonia, unspecified organism: Secondary | ICD-10-CM | POA: Diagnosis present

## 2022-11-09 DIAGNOSIS — D62 Acute posthemorrhagic anemia: Secondary | ICD-10-CM

## 2022-11-09 DIAGNOSIS — D6832 Hemorrhagic disorder due to extrinsic circulating anticoagulants: Secondary | ICD-10-CM | POA: Diagnosis present

## 2022-11-09 DIAGNOSIS — Z992 Dependence on renal dialysis: Secondary | ICD-10-CM

## 2022-11-09 DIAGNOSIS — K3189 Other diseases of stomach and duodenum: Secondary | ICD-10-CM

## 2022-11-09 DIAGNOSIS — Z87891 Personal history of nicotine dependence: Secondary | ICD-10-CM | POA: Diagnosis not present

## 2022-11-09 DIAGNOSIS — R55 Syncope and collapse: Secondary | ICD-10-CM

## 2022-11-09 DIAGNOSIS — G9340 Encephalopathy, unspecified: Secondary | ICD-10-CM | POA: Diagnosis present

## 2022-11-09 DIAGNOSIS — E669 Obesity, unspecified: Secondary | ICD-10-CM | POA: Diagnosis present

## 2022-11-09 DIAGNOSIS — K627 Radiation proctitis: Secondary | ICD-10-CM

## 2022-11-09 DIAGNOSIS — E1140 Type 2 diabetes mellitus with diabetic neuropathy, unspecified: Secondary | ICD-10-CM | POA: Diagnosis present

## 2022-11-09 DIAGNOSIS — K921 Melena: Secondary | ICD-10-CM

## 2022-11-09 DIAGNOSIS — Z79899 Other long term (current) drug therapy: Secondary | ICD-10-CM | POA: Diagnosis not present

## 2022-11-09 DIAGNOSIS — G928 Other toxic encephalopathy: Secondary | ICD-10-CM | POA: Diagnosis not present

## 2022-11-09 DIAGNOSIS — Z794 Long term (current) use of insulin: Secondary | ICD-10-CM

## 2022-11-09 DIAGNOSIS — I480 Paroxysmal atrial fibrillation: Secondary | ICD-10-CM

## 2022-11-09 DIAGNOSIS — N186 End stage renal disease: Secondary | ICD-10-CM | POA: Diagnosis not present

## 2022-11-09 DIAGNOSIS — R569 Unspecified convulsions: Secondary | ICD-10-CM | POA: Diagnosis not present

## 2022-11-09 DIAGNOSIS — K5521 Angiodysplasia of colon with hemorrhage: Secondary | ICD-10-CM | POA: Diagnosis present

## 2022-11-09 DIAGNOSIS — D631 Anemia in chronic kidney disease: Secondary | ICD-10-CM | POA: Diagnosis present

## 2022-11-09 DIAGNOSIS — I350 Nonrheumatic aortic (valve) stenosis: Secondary | ICD-10-CM | POA: Diagnosis present

## 2022-11-09 DIAGNOSIS — E1122 Type 2 diabetes mellitus with diabetic chronic kidney disease: Secondary | ICD-10-CM | POA: Diagnosis present

## 2022-11-09 DIAGNOSIS — E785 Hyperlipidemia, unspecified: Secondary | ICD-10-CM | POA: Diagnosis present

## 2022-11-09 DIAGNOSIS — R4182 Altered mental status, unspecified: Secondary | ICD-10-CM

## 2022-11-09 DIAGNOSIS — I953 Hypotension of hemodialysis: Secondary | ICD-10-CM | POA: Diagnosis not present

## 2022-11-09 DIAGNOSIS — K922 Gastrointestinal hemorrhage, unspecified: Secondary | ICD-10-CM | POA: Diagnosis not present

## 2022-11-09 DIAGNOSIS — E1129 Type 2 diabetes mellitus with other diabetic kidney complication: Secondary | ICD-10-CM

## 2022-11-09 DIAGNOSIS — I9589 Other hypotension: Secondary | ICD-10-CM | POA: Diagnosis not present

## 2022-11-09 DIAGNOSIS — Z86010 Personal history of colonic polyps: Secondary | ICD-10-CM

## 2022-11-09 DIAGNOSIS — I120 Hypertensive chronic kidney disease with stage 5 chronic kidney disease or end stage renal disease: Secondary | ICD-10-CM | POA: Diagnosis present

## 2022-11-09 DIAGNOSIS — Z1152 Encounter for screening for COVID-19: Secondary | ICD-10-CM | POA: Diagnosis not present

## 2022-11-09 DIAGNOSIS — F488 Other specified nonpsychotic mental disorders: Secondary | ICD-10-CM | POA: Diagnosis not present

## 2022-11-09 DIAGNOSIS — E1159 Type 2 diabetes mellitus with other circulatory complications: Secondary | ICD-10-CM | POA: Diagnosis not present

## 2022-11-09 DIAGNOSIS — J810 Acute pulmonary edema: Secondary | ICD-10-CM | POA: Diagnosis not present

## 2022-11-09 DIAGNOSIS — N2581 Secondary hyperparathyroidism of renal origin: Secondary | ICD-10-CM | POA: Diagnosis present

## 2022-11-09 DIAGNOSIS — I272 Pulmonary hypertension, unspecified: Secondary | ICD-10-CM | POA: Diagnosis not present

## 2022-11-09 DIAGNOSIS — K31819 Angiodysplasia of stomach and duodenum without bleeding: Secondary | ICD-10-CM | POA: Diagnosis not present

## 2022-11-09 DIAGNOSIS — J9611 Chronic respiratory failure with hypoxia: Secondary | ICD-10-CM | POA: Diagnosis present

## 2022-11-09 DIAGNOSIS — D649 Anemia, unspecified: Secondary | ICD-10-CM | POA: Diagnosis not present

## 2022-11-09 DIAGNOSIS — E871 Hypo-osmolality and hyponatremia: Secondary | ICD-10-CM | POA: Diagnosis not present

## 2022-11-09 DIAGNOSIS — G9341 Metabolic encephalopathy: Secondary | ICD-10-CM | POA: Diagnosis present

## 2022-11-09 LAB — I-STAT ARTERIAL BLOOD GAS, ED
Acid-Base Excess: 1 mmol/L (ref 0.0–2.0)
Bicarbonate: 28.4 mmol/L — ABNORMAL HIGH (ref 20.0–28.0)
Calcium, Ion: 1.22 mmol/L (ref 1.15–1.40)
HCT: 29 % — ABNORMAL LOW (ref 39.0–52.0)
Hemoglobin: 9.9 g/dL — ABNORMAL LOW (ref 13.0–17.0)
O2 Saturation: 98 %
Patient temperature: 98.6
Potassium: 4.3 mmol/L (ref 3.5–5.1)
Sodium: 139 mmol/L (ref 135–145)
TCO2: 30 mmol/L (ref 22–32)
pCO2 arterial: 57.6 mmHg — ABNORMAL HIGH (ref 32–48)
pH, Arterial: 7.301 — ABNORMAL LOW (ref 7.35–7.45)
pO2, Arterial: 109 mmHg — ABNORMAL HIGH (ref 83–108)

## 2022-11-09 LAB — BASIC METABOLIC PANEL
Anion gap: 16 — ABNORMAL HIGH (ref 5–15)
BUN: 41 mg/dL — ABNORMAL HIGH (ref 8–23)
CO2: 24 mmol/L (ref 22–32)
Calcium: 8.9 mg/dL (ref 8.9–10.3)
Chloride: 98 mmol/L (ref 98–111)
Creatinine, Ser: 8.49 mg/dL — ABNORMAL HIGH (ref 0.61–1.24)
GFR, Estimated: 6 mL/min — ABNORMAL LOW (ref >60)
Glucose, Bld: 113 mg/dL — ABNORMAL HIGH (ref 70–99)
Potassium: 4.3 mmol/L (ref 3.5–5.1)
Sodium: 138 mmol/L (ref 135–145)

## 2022-11-09 LAB — CBC
HCT: 22.7 % — ABNORMAL LOW (ref 39.0–52.0)
HCT: 25.2 % — ABNORMAL LOW (ref 39.0–52.0)
Hemoglobin: 7.1 g/dL — ABNORMAL LOW (ref 13.0–17.0)
Hemoglobin: 8.1 g/dL — ABNORMAL LOW (ref 13.0–17.0)
MCH: 32.7 pg (ref 26.0–34.0)
MCH: 33.1 pg (ref 26.0–34.0)
MCHC: 31.3 g/dL (ref 30.0–36.0)
MCHC: 32.1 g/dL (ref 30.0–36.0)
MCV: 104.6 fL — ABNORMAL HIGH (ref 80.0–100.0)
MCV: 102.9 fL — ABNORMAL HIGH (ref 80.0–100.0)
Platelets: 151 10*3/uL (ref 150–400)
Platelets: 174 10*3/uL (ref 150–400)
RBC: 2.17 MIL/uL — ABNORMAL LOW (ref 4.22–5.81)
RBC: 2.45 MIL/uL — ABNORMAL LOW (ref 4.22–5.81)
RDW: 19.2 % — ABNORMAL HIGH (ref 11.5–15.5)
RDW: 19.7 % — ABNORMAL HIGH (ref 11.5–15.5)
WBC: 9.3 10*3/uL (ref 4.0–10.5)
WBC: 8.7 10*3/uL (ref 4.0–10.5)
nRBC: 0 % (ref 0.0–0.2)
nRBC: 0 % (ref 0.0–0.2)

## 2022-11-09 LAB — BPAM RBC
Blood Product Expiration Date: 202405292359
Unit Type and Rh: 5100
Unit Type and Rh: 5100
Unit Type and Rh: 5100

## 2022-11-09 LAB — MAGNESIUM: Magnesium: 2.3 mg/dL (ref 1.7–2.4)

## 2022-11-09 LAB — PREPARE RBC (CROSSMATCH)

## 2022-11-09 LAB — TYPE AND SCREEN
Unit division: 0
Unit division: 0

## 2022-11-09 LAB — GLUCOSE, CAPILLARY
Glucose-Capillary: 104 mg/dL — ABNORMAL HIGH (ref 70–99)
Glucose-Capillary: 110 mg/dL — ABNORMAL HIGH (ref 70–99)
Glucose-Capillary: 83 mg/dL (ref 70–99)
Glucose-Capillary: 93 mg/dL (ref 70–99)

## 2022-11-09 LAB — CBG MONITORING, ED: Glucose-Capillary: 106 mg/dL — ABNORMAL HIGH (ref 70–99)

## 2022-11-09 LAB — TSH: TSH: 0.867 u[IU]/mL (ref 0.350–4.500)

## 2022-11-09 LAB — AMMONIA: Ammonia: 37 umol/L — ABNORMAL HIGH (ref 9–35)

## 2022-11-09 LAB — PHOSPHORUS: Phosphorus: 7.2 mg/dL — ABNORMAL HIGH (ref 2.5–4.6)

## 2022-11-09 LAB — PROCALCITONIN: Procalcitonin: 1.17 ng/mL

## 2022-11-09 LAB — POC OCCULT BLOOD, ED: Fecal Occult Bld: POSITIVE — AB

## 2022-11-09 LAB — SARS CORONAVIRUS 2 BY RT PCR: SARS Coronavirus 2 by RT PCR: NEGATIVE

## 2022-11-09 LAB — TROPONIN I (HIGH SENSITIVITY): Troponin I (High Sensitivity): 58 ng/L — ABNORMAL HIGH (ref <18)

## 2022-11-09 LAB — HEPATITIS B SURFACE ANTIGEN: Hepatitis B Surface Ag: NONREACTIVE

## 2022-11-09 LAB — BRAIN NATRIURETIC PEPTIDE: B Natriuretic Peptide: 1895.9 pg/mL — ABNORMAL HIGH (ref 0.0–100.0)

## 2022-11-09 MED ORDER — CALCITRIOL 0.25 MCG PO CAPS
0.7500 ug | ORAL_CAPSULE | ORAL | Status: DC
  Administered 2022-11-11 – 2022-11-20 (×5): 0.75 ug via ORAL
  Filled 2022-11-09 (×5): qty 3

## 2022-11-09 MED ORDER — MIDODRINE HCL 5 MG PO TABS
10.0000 mg | ORAL_TABLET | Freq: Three times a day (TID) | ORAL | Status: DC
  Administered 2022-11-09 – 2022-11-12 (×11): 10 mg via ORAL
  Filled 2022-11-09 (×12): qty 2

## 2022-11-09 MED ORDER — ALBUMIN HUMAN 25 % IV SOLN
12.5000 g | Freq: Once | INTRAVENOUS | Status: AC
  Administered 2022-11-09: 25 g via INTRAVENOUS
  Filled 2022-11-09: qty 50

## 2022-11-09 MED ORDER — VANCOMYCIN HCL 2000 MG/400ML IV SOLN
2000.0000 mg | Freq: Once | INTRAVENOUS | Status: AC
  Administered 2022-11-10: 2000 mg via INTRAVENOUS
  Filled 2022-11-09: qty 400

## 2022-11-09 MED ORDER — LINEZOLID 600 MG/300ML IV SOLN
600.0000 mg | Freq: Two times a day (BID) | INTRAVENOUS | Status: DC
  Filled 2022-11-09: qty 300

## 2022-11-09 MED ORDER — PANTOPRAZOLE SODIUM 40 MG IV SOLR
40.0000 mg | Freq: Two times a day (BID) | INTRAVENOUS | Status: DC
  Administered 2022-11-09 – 2022-11-12 (×8): 40 mg via INTRAVENOUS
  Filled 2022-11-09 (×8): qty 10

## 2022-11-09 MED ORDER — SODIUM CHLORIDE 0.9 % IV SOLN
1.0000 g | INTRAVENOUS | Status: AC
  Administered 2022-11-09 – 2022-11-13 (×5): 1 g via INTRAVENOUS
  Filled 2022-11-09 (×6): qty 10

## 2022-11-09 MED ORDER — SODIUM CHLORIDE 0.9 % IV SOLN: INTRAVENOUS | Status: DC

## 2022-11-09 MED ORDER — SODIUM CHLORIDE 0.9% IV SOLUTION
Freq: Once | INTRAVENOUS | Status: AC
  Administered 2022-11-09: 20 mL/h via INTRAVENOUS

## 2022-11-09 MED ORDER — VANCOMYCIN HCL IN DEXTROSE 1-5 GM/200ML-% IV SOLN
1000.0000 mg | INTRAVENOUS | Status: AC
  Administered 2022-11-11 – 2022-11-13 (×2): 1000 mg via INTRAVENOUS
  Filled 2022-11-09 (×2): qty 200

## 2022-11-09 MED ORDER — SODIUM CHLORIDE 0.9 % IV SOLN
62.5000 mg | INTRAVENOUS | Status: AC
  Administered 2022-11-11 – 2022-11-18 (×4): 62.5 mg via INTRAVENOUS
  Filled 2022-11-09 (×7): qty 5

## 2022-11-09 MED ORDER — ACETAMINOPHEN 650 MG RE SUPP: 650.0000 mg | Freq: Four times a day (QID) | RECTAL | Status: DC | PRN

## 2022-11-09 MED ORDER — INSULIN ASPART 100 UNIT/ML IJ SOLN
0.0000 [IU] | INTRAMUSCULAR | Status: DC
  Administered 2022-11-12 – 2022-11-15 (×2): 1 [IU] via SUBCUTANEOUS

## 2022-11-09 MED ORDER — CHLORHEXIDINE GLUCONATE CLOTH 2 % EX PADS
6.0000 | MEDICATED_PAD | Freq: Every day | CUTANEOUS | Status: DC
  Administered 2022-11-09 – 2022-11-10 (×2): 6 via TOPICAL

## 2022-11-09 MED ORDER — ALBUMIN HUMAN 25 % IV SOLN
12.5000 g | Freq: Once | INTRAVENOUS | Status: AC
  Administered 2022-11-09: 12.5 g via INTRAVENOUS
  Filled 2022-11-09: qty 50

## 2022-11-09 MED ORDER — ACETAMINOPHEN 325 MG PO TABS
650.0000 mg | ORAL_TABLET | Freq: Four times a day (QID) | ORAL | Status: DC | PRN
  Filled 2022-11-09: qty 2

## 2022-11-09 MED ORDER — SODIUM CHLORIDE 0.9% IV SOLUTION: Freq: Once | INTRAVENOUS | Status: AC

## 2022-11-09 NOTE — Procedures (Signed)
Patient Name: Justin [name]  MRN: [id number]  Epilepsy Attending: Charlsie Quest  Referring Physician/Provider: John Giovanni, MD  Date: 11/09/2022 Duration: 25.03 mins  Patient history: 77yo M with ams and syncope. Getting eeg to evaluate for seizure.  Level of alertness: Awake, asleep  AEDs during EEG study: None  Technical aspects: This EEG study was done with scalp electrodes positioned according to the 10-20 International system of electrode placement. Electrical activity was reviewed with band pass filter of 1-70Hz , sensitivity of 7 uV/mm, display speed of 45mm/sec with a 60Hz  notched filter applied as appropriate. EEG data were recorded continuously and digitally stored.  Video monitoring was available and reviewed as appropriate.  Description: The posterior dominant rhythm consists of 7.5 Hz activity of moderate voltage (25-35 uV) seen predominantly in posterior head regions, symmetric and reactive to eye opening and eye closing. Sleep was characterized by vertex waves, sleep spindles (12 to 14 Hz), maximal frontocentral region. EEG showed intermittent generalized 3-6hz  theta-delta slowing. Physiologic photic driving was not seen during photic stimulation. Hyperventilation was not performed.     ABNORMALITY - Intermittent slow, generalized  IMPRESSION: This study is suggestive of mild diffuse encephalopathy. No seizures or epileptiform discharges were seen throughout the recording.  Justin [name]

## 2022-11-09 NOTE — ED Notes (Addendum)
ED TO INPATIENT HANDOFF REPORT  ED Nurse Name and Phone #: Theadora Rama RN [phone]  S Name/Age/Gender Justin [name] 77 y.o. male Room/Bed: 033C/033C  Code Status   Code Status: Prior  Home/SNF/Other Home Patient oriented to: self, place, time, and situation Is this baseline? Yes   Triage Complete: Triage complete  Chief Complaint Acute encephalopathy [G93.40]  Triage Note Pt BIB [facility] from [facility] facility d/t a syncopal episode. Pt was conversing w his daughter and granddaughter, and all of the sudden lost consciousness. Nursing home performed CPR pt for about 4 mins. PT'S initial sat was in the 70s on RA, EMS gave 1 neb treatment and placed the pt on NRB. HR been in the 100s w EMS en route. Pt is a dialysis pt, MONDAY, Wednesdays and Fridays. A&O X4.    Allergies No Known Allergies  Level of Care/Admitting Diagnosis ED Disposition     ED Disposition  Admit   Condition  --   Comment  Hospital Area: [facility] [[id number]]  Level of Care: Progressive [102]  Admit to Progressive based on following criteria: NEUROLOGICAL AND NEUROSURGICAL complex patients with significant risk of instability, who do not meet ICU criteria, yet require close observation or frequent assessment (< / = every 2 - 4 hours) with medical / nursing intervention.  May admit patient to [facility] or [facility] if equivalent level of care is available:: Yes  Covid Evaluation: Asymptomatic - no recent exposure (last 10 days) testing not required  Diagnosis: Acute encephalopathy [[id number]]  Admitting Physician: John Giovanni [[id number]]  Attending Physician: John Giovanni [[id number]]  Certification:: I certify this patient will need inpatient services for at least 2 midnights  Estimated Length of Stay: 2          B Medical/Surgery History Past Medical History:  Diagnosis Date   Acute respiratory failure with hypoxia and hypercapnia (HCC) 06/15/2022   Allergy, unspecified,  initial encounter 10/10/2019   Anemia    Anemia in chronic kidney disease 11/20/2015   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; radiation seeds   Chronic kidney disease    stage 4   Diabetes mellitus    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) 12/07/2019   Dialysis patient (HCC)    Gout    Hypercalcemia 11/14/2021   Hyperkalemia 10/19/2018   Hypertension    Hypotension 06/13/2022   Right posterior capsular opacification 02/27/2021   Septic shock (HCC)    Vitreous hemorrhage of right eye (HCC) 10/24/2019   Past Surgical History:  Procedure Laterality Date   A/V FISTULAGRAM Left 08/17/2017   Procedure: A/V FISTULAGRAM;  Surgeon: Renford Dills, MD;  Location: [facility] INVASIVE CV LAB;  Service: Cardiovascular;  Laterality: Left;   A/V SHUNT INTERVENTION N/A 08/17/2017   Procedure: A/V SHUNT INTERVENTION;  Surgeon: Renford Dills, MD;  Location: [facility] INVASIVE CV LAB;  Service: Cardiovascular;  Laterality: N/A;   AV FISTULA PLACEMENT Left 05/31/2015   Procedure: ARTERIOVENOUS (AV) FISTULA CREATION;  Surgeon: Renford Dills, MD;  Location: [facility] ORS;  Service: Vascular;  Laterality: Left;   COLONOSCOPY WITH PROPOFOL N/A 09/11/2022   Procedure: COLONOSCOPY WITH PROPOFOL;  Surgeon: Imogene Burn, MD;  Location: [facility] ENDOSCOPY;  Service: Gastroenterology;  Laterality: N/A;   COLONOSCOPY WITH PROPOFOL N/A 09/17/2022   Procedure: COLONOSCOPY WITH PROPOFOL;  Surgeon: Benancio Deeds, MD;  Location: [facility] ENDOSCOPY;  Service: Gastroenterology;  Laterality: N/A;   ESOPHAGOGASTRODUODENOSCOPY (EGD)  WITH PROPOFOL N/A 09/11/2022   Procedure: ESOPHAGOGASTRODUODENOSCOPY (EGD) WITH PROPOFOL;  Surgeon: Imogene Burn, MD;  Location: [facility] ENDOSCOPY;  Service: Gastroenterology;  Laterality: N/A;   excision bone spurs Bilateral 1989   feet   HEMOSTASIS CLIP PLACEMENT  09/11/2022   Procedure: HEMOSTASIS CLIP PLACEMENT;  Surgeon: Imogene Burn, MD;   Location: [facility] ENDOSCOPY;  Service: Gastroenterology;;   HEMOSTASIS CLIP PLACEMENT  09/17/2022   Procedure: HEMOSTASIS CLIP PLACEMENT;  Surgeon: Benancio Deeds, MD;  Location: [facility] ENDOSCOPY;  Service: Gastroenterology;;   HOT HEMOSTASIS N/A 09/11/2022   Procedure: HOT HEMOSTASIS (ARGON PLASMA COAGULATION/BICAP);  Surgeon: Imogene Burn, MD;  Location: [facility] ENDOSCOPY;  Service: Gastroenterology;  Laterality: N/A;   HOT HEMOSTASIS N/A 09/17/2022   Procedure: HOT HEMOSTASIS (ARGON PLASMA COAGULATION/BICAP);  Surgeon: Benancio Deeds, MD;  Location: [facility] ENDOSCOPY;  Service: Gastroenterology;  Laterality: N/A;   INCISION AND DRAINAGE PERIRECTAL ABSCESS N/A 06/15/2022   Procedure: IRRIGATION AND DEBRIDEMENT PERIRECTAL ABSCESS;  Surgeon: Franky Macho, MD;  Location: [facility] ORS;  Service: General;  Laterality: N/A;   KNEE SURGERY Left 1998   arthroscopy   POLYPECTOMY  09/11/2022   Procedure: POLYPECTOMY;  Surgeon: Imogene Burn, MD;  Location: [facility] ENDOSCOPY;  Service: Gastroenterology;;   SHOULDER SURGERY Left 1994   rotator cuff     A IV Location/Drains/Wounds Patient Lines/Drains/Airways Status     Active Line/Drains/Airways     Name Placement date Placement time Site Days   Peripheral IV 11/08/22 20 G Anterior;Distal;Right;Upper Arm 11/08/22  --  Arm  1   Fistula / Graft Left Upper arm Arteriovenous fistula --  --  Upper arm  --   Wound / Incision (Open or Dehisced) 01/29/20 Non-pressure wound Back Lower abscess 01/29/20  2303  Back  1015   Wound / Incision (Open or Dehisced) 06/13/22 Non-pressure wound Buttocks Left abscess to inside of buttock 06/13/22  0228  Buttocks  149            Intake/Output Last 24 hours No intake or output data in the 24 hours ending 11/09/22 0136  Labs/Imaging Results for orders placed or performed during the hospital encounter of 11/08/22 (from the past 48 hour(s))  CBC with Differential/Platelet     Status: Abnormal   Collection Time: 11/08/22  6:45 PM   Result Value Ref Range   WBC 9.6 4.0 - 10.5 K/uL   RBC 2.20 (L) 4.22 - 5.81 MIL/uL   Hemoglobin 7.4 (L) 13.0 - 17.0 g/dL   HCT 16.1 (L) 09.6 - 04.5 %   MCV 104.5 (H) 80.0 - 100.0 fL   MCH 33.6 26.0 - 34.0 pg   MCHC 32.2 30.0 - 36.0 g/dL   RDW 40.9 (H) 81.1 - 91.4 %   Platelets 175 150 - 400 K/uL    Comment: REPEATED TO VERIFY   nRBC 0.0 0.0 - 0.2 %   Neutrophils Relative % 74 %   Neutro Abs 7.1 1.7 - 7.7 K/uL   Lymphocytes Relative 13 %   Lymphs Abs 1.2 0.7 - 4.0 K/uL   Monocytes Relative 11 %   Monocytes Absolute 1.0 0.1 - 1.0 K/uL   Eosinophils Relative 1 %   Eosinophils Absolute 0.1 0.0 - 0.5 K/uL   Basophils Relative 0 %   Basophils Absolute 0.0 0.0 - 0.1 K/uL   Immature Granulocytes 1 %   Abs Immature Granulocytes 0.09 (H) 0.00 - 0.07 K/uL    Comment: Performed at [facility], [address] [place],  [place] [address]  Troponin I (High Sensitivity)     Status: Abnormal   Collection Time: 11/08/22  6:45 PM  Result Value Ref Range   Troponin I (High Sensitivity) 40 (H) <18 ng/L    Comment: (NOTE) Elevated high sensitivity troponin I (hsTnI) values and significant  changes across serial measurements may suggest ACS but many other  chronic and acute conditions are known to elevate hsTnI results.  Refer to the "Links" section for chest pain algorithms and additional  guidance. Performed at [facility], [address] [place], [place] [address]   Comprehensive metabolic panel     Status: Abnormal   Collection Time: 11/08/22  8:20 PM  Result Value Ref Range   Sodium 138 135 - 145 mmol/L   Potassium 4.3 3.5 - 5.1 mmol/L   Chloride 98 98 - 111 mmol/L   CO2 24 22 - 32 mmol/L   Glucose, Bld 154 (H) 70 - 99 mg/dL    Comment: Glucose reference range applies only to samples taken after fasting for at least 8 hours.   BUN 42 (H) 8 - 23 mg/dL   Creatinine, Ser 0.98 (H) 0.61 - 1.24 mg/dL   Calcium 8.8 (L) 8.9 - 10.3 mg/dL   Total Protein 6.8 6.5 - 8.1 g/dL    Albumin 2.0 (L) 3.5 - 5.0 g/dL   AST 27 15 - 41 U/L   ALT 32 0 - 44 U/L   Alkaline Phosphatase 84 38 - 126 U/L   Total Bilirubin 1.0 0.3 - 1.2 mg/dL   GFR, Estimated 6 (L) >60 mL/min    Comment: (NOTE) Calculated using the CKD-EPI Creatinine Equation (2021)    Anion gap 16 (H) 5 - 15    Comment: Performed at [facility], [address] [place], [place] [address]  Troponin I (High Sensitivity)     Status: Abnormal   Collection Time: 11/08/22  8:20 PM  Result Value Ref Range   Troponin I (High Sensitivity) 48 (H) <18 ng/L    Comment: (NOTE) Elevated high sensitivity troponin I (hsTnI) values and significant  changes across serial measurements may suggest ACS but many other  chronic and acute conditions are known to elevate hsTnI results.  Refer to the "Links" section for chest pain algorithms and additional  guidance. Performed at [facility], [address] [place], [place] [address]   POC occult blood, ED     Status: Abnormal   Collection Time: 11/09/22 12:04 AM  Result Value Ref Range   Fecal Occult Bld POSITIVE (A) NEGATIVE   DG Chest Port 1 View  Result Date: 11/08/2022 CLINICAL DATA:  Syncope EXAM: PORTABLE CHEST 1 VIEW COMPARISON:  09/25/2022 FINDINGS: Mild cardiomegaly. Worsened bilateral lower lung airspace disease. Possible small left effusion. Aortic atherosclerosis. No pneumothorax. Left axillary vascular stent IMPRESSION: Worsened bilateral lower lung airspace disease which may be due to atelectasis or pneumonia. Possible small left effusion. Mild cardiomegaly. Electronically Signed   By: Jasmine Pang M.D.   On: 11/08/2022 19:28    Pending Labs Unresulted Labs (From admission, onward)    None       Vitals/Pain Today's Vitals   11/08/22 1815 11/08/22 2144 11/08/22 2200 11/09/22 0005  BP: 106/81 (!) 153/105 (!) 142/109 (!) 152/132  Pulse:  (!) 108    Resp: 11 (!) 21 11 16   Temp:  98.1 F (36.7 C)    TempSrc:  Oral    SpO2:  100%  96%  Isolation Precautions No active isolations  Medications Medications - No data to display  Mobility non-ambulatory     Focused Assessments Neuro Assessment Handoff:  Swallow screen pass? Yes  Cardiac Rhythm: Sinus tachycardia       Neuro Assessment:   Neuro Checks:      Has TPA been given? No If patient is a Neuro Trauma and patient is going to OR before floor call report to 4N Charge nurse: [phone] or [phone]  , Pulmonary Assessment Handoff:  Lung sounds:   O2 Device: Nasal Cannula O2 Flow Rate (L/min): 3 L/min    R Recommendations: See Admitting Provider Note  Report given to:   Additional Notes:

## 2022-11-09 NOTE — Progress Notes (Signed)
   11/09/22 2335  Pain Assessment  Pain Scale 0-10  Pain Score 0  Fistula / Graft Left Upper arm Arteriovenous fistula  No Placement Date or Time found.   Placed prior to admission: Yes  Orientation: Left  Access Location: Upper arm  Access Type: Arteriovenous fistula  Status Deaccessed  Drainage Description  (Pressure dressing dry and intact)  Neurological  Level of Consciousness Alert  Orientation Level Oriented X4  RUE Motor Strength 5  LUE Motor Strength 5  Respiratory  Respiratory Pattern Regular;Unlabored  Chest Assessment Chest expansion symmetrical  Bilateral Breath Sounds Diminished;Fine crackles  R Lower Breath Sounds Diminished  L Lower Breath Sounds Fine crackles  Cough Non-productive  Cardiac  Pulse Regular  Heart Sounds Murmur;S1, S2  Cardiac Rhythm Heart block;NSR  Heart Block Type 1st degree AVB  Vascular  R Radial Pulse +2  L Radial Pulse +3  R Dorsalis Pedis Pulse +1  L Dorsalis Pedis Pulse +1  R Posterior Tibial Pulse +1  L Posterior Tibial Pulse +1  Edema Generalized  LLE Edema +2  GU Assessment  Genitourinary (WDL) X  Genitourinary Symptoms Anuria  Psychosocial  Psychosocial (WDL) WDL   Treatment  duration 3.5 hrs UF Removed 3.1L B/P 97/37 Wt 108.7 kg

## 2022-11-09 NOTE — Consult Note (Addendum)
NAME:  Justin [name], MRN:  [id number], DOB:  30-Jun-1946, LOS: 0 ADMISSION DATE:  11/08/2022, CONSULTATION DATE:  4/29 REFERRING MD:  Dr. 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  Upon EMS arrival the patient was awake with a pulse but was hypoxic with oxygen saturation 70%.  Upon arrival to the emergency department he was satting somewhat better and was hemodynamically stable.  Laboratory evaluation significant for hemoglobin 7.4.  He had melena on exam and occult blood testing was positive.  He was admitted to the hospital service for treatment of syncope, altered mental status, GI bleed, possible pneumonia.  Over the course of his ED stay he became hypotensive and PCCM was consulted.  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   Significant  Hospital Events: Including procedures, antibiotic start and stop dates in addition to other pertinent events     Interim History / Subjective:    Objective   Blood pressure (!) 82/58, pulse (!) 108, temperature 98.1 F (36.7 C), temperature source Oral, resp. rate 17, SpO2 96 %.       No intake or output data in the 24 hours ending 11/09/22 0221 There were no vitals filed for this visit.  Examination: General: Elderly appearing male in no acute distress HENT: Normocephalic, atraumatic, PERRL Lungs: Clear bilateral breath sounds Cardiovascular: Regular rate and rhythm, systolic murmur Abdomen: Soft, nontender, nondistended Extremities: 2+ edema to bilateral lower extremities Neuro: Spontaneous awake, alert, oriented, nonfocal   Resolved Hospital Problem list     Assessment & Plan:   Hypotension: Patient is chronically hypotensive taking midodrine at home.  Unclear when his last dose was.  Also possible GI bleed is playing a role with hemoglobin 7.4. -Restart home midodrine with a dose now -Transfuse 1 unit PRBC -Close monitoring progressive care -If does not improve with midodrine and transfusion we can transfer to ICU for closer monitoring.  Syncope Acute encephalopathy Possible cardiac arrest, unlikely -Labs with no clear indication of etiology -TSH, ammonia pending.  ABG pending -CT head pending  Hypoxia: recently started on home O2, was on room air upon EMS arrival. Bibasilar infiltrates on CXR. Edema vs PNA.  - Hopefully improves with HD - Labs don't indicate infection, PCT pending.  - OK to hold HD for now  GI bleed: Known history of rectal AVMs -Transfuse -GI consulted by primary service  ESRD on HD -Primary plan to consult nephrology in the morning -Typically receives dialysis Monday Wednesday Friday  DM2 -Per primary  Best Practice (right  click and "Reselect all SmartList Selections" daily)   Diet/type: NPO DVT prophylaxis: not indicated GI  prophylaxis: PPI Lines: N/A Foley:  N/A Code Status:  full code Last date of multidisciplinary goals of care discussion [ ]   Labs   CBC: Recent Labs  Lab 11/08/22 1845  WBC 9.6  NEUTROABS 7.1  HGB 7.4*  HCT 23.0*  MCV 104.5*  PLT 175    Basic Metabolic Panel: Recent Labs  Lab 11/08/22 2020  NA 138  K 4.3  CL 98  CO2 24  GLUCOSE 154*  BUN 42*  CREATININE 8.41*  CALCIUM 8.8*   GFR: CrCl cannot be calculated (Unknown ideal weight.). Recent Labs  Lab 11/08/22 1845  WBC 9.6    Liver Function Tests: Recent Labs  Lab 11/08/22 2020  AST 27  ALT 32  ALKPHOS 84  BILITOT 1.0  PROT 6.8  ALBUMIN 2.0*   No results for input(s): "LIPASE", "AMYLASE" in the last 168 hours. No results for input(s): "AMMONIA" in the last 168 hours.  ABG    Component Value Date/Time   PHART 7.43 06/15/2022 1625   PCO2ART 43 06/15/2022 1625   PO2ART 101 06/15/2022 1625   HCO3 28.5 (H) 06/15/2022 1625   TCO2 33 (H) 09/10/2022 0849   O2SAT 98.3 06/15/2022 1625     Coagulation Profile: No results for input(s): "INR", "PROTIME" in the last 168 hours.  Cardiac Enzymes: No results for input(s): "CKTOTAL", "CKMB", "CKMBINDEX", "TROPONINI" in the last 168 hours.  HbA1C: Hgb A1c MFr Bld  Date/Time Value Ref Range Status  06/16/2022 03:55 AM 6.5 (H) 4.8 - 5.6 % Final    Comment:    (NOTE)         Prediabetes: 5.7 - 6.4         Diabetes: >6.4         Glycemic control for adults with diabetes: <7.0   01/28/2020 09:51 AM 6.1 (H) 4.8 - 5.6 % Final    Comment:    (NOTE) Pre diabetes:          5.7%-6.4%  Diabetes:              >6.4%  Glycemic control for   <7.0% adults with diabetes     CBG: Recent Labs  Lab 11/09/22 0149  GLUCAP 106*    Review of Systems:   As above  Past Medical History:  He,  has a past medical history of Acute respiratory failure with hypoxia and hypercapnia (HCC) (06/15/2022), Allergy, unspecified, initial encounter (10/10/2019), Anemia, Anemia  in chronic kidney disease (11/20/2015), Cancer (HCC) (2009), Chronic kidney disease, Diabetes mellitus, 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   Surgical History:   Past Surgical History:  Procedure Laterality Date   A/V FISTULAGRAM Left 08/17/2017   Procedure: A/V FISTULAGRAM;  Surgeon: Renford Dills, MD;  Location: [facility] INVASIVE CV LAB;  Service: Cardiovascular;  Laterality: Left;   A/V SHUNT INTERVENTION N/A 08/17/2017   Procedure: A/V SHUNT INTERVENTION;  Surgeon: Renford Dills, MD;  Location: [facility] INVASIVE CV LAB;  Service: Cardiovascular;  Laterality: N/A;   AV FISTULA PLACEMENT Left 05/31/2015   Procedure: ARTERIOVENOUS (AV) FISTULA CREATION;  Surgeon: Renford Dills, MD;  Location: [facility] ORS;  Service: Vascular;  Laterality: Left;   COLONOSCOPY WITH PROPOFOL  N/A 09/11/2022   Procedure: COLONOSCOPY WITH PROPOFOL;  Surgeon: Imogene Burn, MD;  Location: [facility] ENDOSCOPY;  Service: Gastroenterology;  Laterality: N/A;   COLONOSCOPY WITH PROPOFOL N/A 09/17/2022   Procedure: COLONOSCOPY WITH PROPOFOL;  Surgeon: Benancio Deeds, MD;  Location: [facility] ENDOSCOPY;  Service: Gastroenterology;  Laterality: N/A;   ESOPHAGOGASTRODUODENOSCOPY (EGD) WITH PROPOFOL N/A 09/11/2022   Procedure: ESOPHAGOGASTRODUODENOSCOPY (EGD) WITH PROPOFOL;  Surgeon: Imogene Burn, MD;  Location: [facility] ENDOSCOPY;  Service: Gastroenterology;  Laterality: N/A;   excision bone spurs Bilateral 1989   feet   HEMOSTASIS CLIP PLACEMENT  09/11/2022   Procedure: HEMOSTASIS CLIP PLACEMENT;  Surgeon: Imogene Burn, MD;  Location: [facility] ENDOSCOPY;  Service: Gastroenterology;;   HEMOSTASIS CLIP  PLACEMENT  09/17/2022   Procedure: HEMOSTASIS CLIP PLACEMENT;  Surgeon: Benancio Deeds, MD;  Location: [facility] ENDOSCOPY;  Service: Gastroenterology;;   HOT HEMOSTASIS N/A 09/11/2022   Procedure: HOT HEMOSTASIS (ARGON PLASMA COAGULATION/BICAP);  Surgeon: Imogene Burn, MD;  Location: [facility] ENDOSCOPY;  Service: Gastroenterology;  Laterality: N/A;   HOT HEMOSTASIS N/A 09/17/2022   Procedure: HOT HEMOSTASIS (ARGON PLASMA COAGULATION/BICAP);  Surgeon: Benancio Deeds, MD;  Location: [facility] ENDOSCOPY;  Service: Gastroenterology;  Laterality: N/A;   INCISION AND DRAINAGE PERIRECTAL ABSCESS N/A 06/15/2022   Procedure: IRRIGATION AND DEBRIDEMENT PERIRECTAL ABSCESS;  Surgeon: Franky Macho, MD;  Location: [facility] ORS;  Service: General;  Laterality: N/A;   KNEE SURGERY Left 1998   arthroscopy   POLYPECTOMY  09/11/2022   Procedure: POLYPECTOMY;  Surgeon: Imogene Burn, MD;  Location: [facility] ENDOSCOPY;  Service: Gastroenterology;;   SHOULDER SURGERY Left 1994   rotator cuff     Social History:   reports that he quit smoking about 44 years ago. His smoking use included cigarettes. He has never used smokeless tobacco. He reports that he does not drink alcohol and does not use drugs.   Family History:  His family history includes Alcoholism in his brother and father; Kidney disease in his mother.   Allergies No Known Allergies   Home Medications  Prior to Admission medications   Medication Sig Start Date End Date Taking? Authorizing Provider  aspirin EC 81 MG tablet Take 81 mg by mouth daily. 11/06/22  Yes [provider]  b complex-vitamin c-folic acid (NEPHRO-VITE) 0.8 MG TABS tablet Take 1 tablet by mouth at bedtime.   Yes [provider]  ELIQUIS 5 MG TABS tablet Take 1 tablet (5 mg total) by mouth 2 (two) times daily. Resume Your Eliquis after 5 days 09/23/22  Yes Kc, Dayna Barker, MD  midodrine (PROAMATINE) 5 MG tablet Take 10 mg by mouth in the morning, at noon, and at bedtime. Prior to dialysis  M-W-F 08/13/22  Yes [provider]  Olopatadine HCl (PATADAY) 0.2 % SOLN Place 1 drop into both eyes daily.   Yes [provider]  omeprazole (PRILOSEC) 20 MG capsule Take 20 mg by mouth every morning.    Yes [provider]  pantoprazole (PROTONIX) 40 MG tablet Take 40 mg by mouth daily. 11/04/22  Yes [provider]  pregabalin (LYRICA) 75 MG capsule Take 1 capsule (75 mg total) by mouth 2 (two) times daily. Patient taking differently: Take 75 mg by mouth at bedtime. 06/26/22  Yes Tat, Onalee Hua, MD  VELPHORO 500 MG chewable tablet Chew 500 mg by mouth 3 (three) times daily with meals. 06/03/22  Yes [provider]  allopurinol (ZYLOPRIM) 100 MG tablet Take 100 mg by mouth daily. Patient not taking: Reported on  11/09/2022 09/13/21   [provider]  AMBULATORY NON FORMULARY MEDICATION Medication Name: Sucralfate 50 mg suppository- Insert 1 suppository into rectum twice daily x 14 days. Patient not taking: Reported on 11/09/2022 09/18/22   Benancio Deeds, MD  amiodarone (PACERONE) 200 MG tablet Take 1 tablet (200 mg total) by mouth 2 (two) times daily. On 07/09/22, start amiodarone 200 mg once daily Patient not taking: Reported on 11/09/2022 06/26/22   Catarina Hartshorn, MD  atorvastatin (LIPITOR) 40 MG tablet Take 40 mg by mouth daily.  Patient not taking: Reported on 11/09/2022 10/30/14   [provider]  cinacalcet (SENSIPAR) 30 MG tablet Take 30 mg by mouth daily. Patient not taking: Reported on 11/09/2022 08/25/22   [provider]  HYDROcodone-acetaminophen (NORCO/VICODIN) 5-325 MG tablet Take 1 tablet by mouth every 6 (six) hours as needed for severe pain. Patient not taking: Reported on 11/09/2022 06/26/22   TatOnalee Hua, MD  [facility] 10 g PACK packet Take 10 g by mouth 2 (two) times a week. Tuesday and Thursday Patient not taking: Reported on 11/09/2022 06/08/22   [provider]  Methoxy PEG-Epoetin Beta (MIRCERA IJ)  Mircera Patient not taking: Reported on 11/09/2022 06/13/21 12/18/22  [provider]  metoprolol tartrate (LOPRESSOR) 25 MG tablet Take 0.5 tablets (12.5 mg total) by mouth 2 (two) times daily. Patient not taking: Reported on 11/09/2022 06/26/22   Catarina Hartshorn, MD    Critical care time: 41 minutees     Joneen Roach, AGACNP-BC  Pulmonary & Critical Care  See Amion for personal pager PCCM on call pager [phone] until 7pm. Please call Elink 7p-7a. [phone]  11/09/2022 2:33 AM

## 2022-11-09 NOTE — Consult Note (Addendum)
Attending physician's note   I have taken a history, reviewed the chart, and examined the patient. I performed a substantive portion of this encounter, including complete performance of at least one of the key components, in conjunction with the APP. I agree with the APP's note, impression, and recommendations with my edits.   77 year old male with complex medical history as outlined below, to include recent hospital admissions for rectal abscess/septic shock in 06/2022, LGIB x 2 in February and March (resected adenomas, treated rectal AVMs/radiation proctitis with APC, post polypectomy bleed with subsequent clipping), and most recently admission to [facility] earlier this month for acute cholecystitis requiring PERC drain in the setting of NSTEMI.  Repeat EGD at that time with no active bleeding.  He was being scheduled for procedures later this week, to include EUS for further evaluation of the gastric submucosal nodule noted on EGD in March, along with colonoscopy with EMR of known cecal polyp.  He is now readmitted with syncope, AMS, and acute on chronic anemia with heme positive stool.  Family in room during my evaluation, and he is now able to answer questions appropriately and recall recent events well.  FOBT positive on arrival, but denies any overt hematochezia or melena over the last several days.  We discussed the role/utility of repeat endoscopic evaluation for diagnostic and therapeutic intent.  He is in favor of procedures if these need to be done.  - Video Capsule Endoscopy to evaluate for additional small bowel pathology, including small bowel AVMs in the setting of ESRD and valvular disease - Transfusing 1 unit PRBCs now - Posttransfusion CBC check and serial CBCs with additional blood products as needed per protocol - If able to place VCE today, will resume diet afterwards per protocol.  If unable to place until tomorrow, clears okay today with n.p.o. at midnight - GI  service will continue to follow   The indications, risks, and benefits of Video Capsule Endoscopy were explained to the patient and his family members at bedside in detail. Risks include but are not limited to capsule retention requiring endoscopic or surgical retrieval (occurs in 1-2%), failure to reach cecum prior to capsule battery expiration (ie, incomplete study), or inadequate visualization (ie, non-diagnostic study). The patient verbalized understanding and wished to proceed. All questions answered, referred to scheduler. Further recommendations pending results of the exam.    Doristine Locks, DO, FACG [phone] office           Consultation  Referring Provider:  [facility]  Primary Care Physician:  [name], DO Primary Gastroenterologist:  unassigned       Reason for Consultation:    acute on chronic anemia, melena, FOBT positive   LOS: 0 days          HPI:   Justin [name] is a 77 y.o. 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, 1st degree AVB, hx prolonged QT, mod-severe AS, hypotension on midodrine, HLD, obesity, DM2, chronic anemia, gout, hx prostate CA (2009), colon polyps, rectal AVMs, stomach mass, and multiple recent hospitalizations/ED visits for GIB s/p EGD/colonoscopies, presents for evaluation of acute on chronic anemia, melena, FOBT positive   -3 hospitalizations since December 2023 -December 2023 hospitalized for rectal abscess/septic shock -2/29 to 09/12/2022 hospitalized for rectal bleeding -3/3 to 09/18/2022 hospitalized for rectal bleeding  INTERVAL HISTORY PRIOR TO CURRENT ADMISSION - OUTSIDE FACILITY  Patient recently admitted to [facility] 10/13/2022.  He was  transferred from [facility] in [place] for shock, possible acute cholecystitis and NSTEMI.  Presented to hospital in [facility] for left-sided abdominal pain.  Initial workup showed gallstones and 3.6 cm mass in fundus of stomach.   He was started on empiric antibiotics.  Underwent EGD 4/1 that revealed no abnormalities apart from submucosal gastric mass.  Biopsies were done.  Biopsies showed inconclusive result per pathologist who recommended deeper biopsy samples in the future  HIDA scan 4/2 suspicious for acute cholecystitis.  After HIDA scan he was hypotensive.  Troponin 1750.  EKG with NSR with first-degree AVB, absence of symptoms.  Patient was then transferred to [facility].  Arrived to [facility] 4/4.  Was on 3 pressors and heparin infusion.  Suspicious for NSTEMI and cholecystitis.  surgery and cardiology was consulted.  General surgery declined surgical intervention due to history of aortic stenosis.  Recommended IR do cholecystotomy tube.  This attempt was canceled due to bradycardia.  Bradycardia was resolved on epinephrine.  Cardiology evaluated patient and felt he was okay for surgical intervention since AS is moderate, not severe.  IR tube placement successfully done and patient on broad-spectrum antibiotics.  Patient remained in hospital until 4/22.  He was put back on Eliquis 4/18.  Recommended to follow-up outpatient with IR for cholecystotomy tube replacement every 6 weeks.  Was scheduled to be changed by IR at [facility] May 17.  Also recommended to follow-up with surgery as an outpatient.  Patient was discharged on oxygen.  On last discharge from [facility] system in March he was recommended to follow up outpatient with Dr. Leonides Schanz 4/10 for follow up colonoscopy and possible EUS for gastric nodule on EGD. This was a missed appt due to hospital admission.   CURRENT ADMISSION No family in room. Patient unresponsive, arouses to voice but does not follow commands or interact with questions. Per chart review, appears patient was brought to ED for questionable syncope. Appears he was in facility when family noticed he "lost consciousness." Patient is supposed to be on O2 via [place] since recent hospital discharge and at the time of syncope  he was not on oxygen. Facility staff reportedly did 4 minutes of CPR. When EMS arrived patient was off oxygen, sats in 70s, patient had a pulse and was awake. He was brought to ED. In ED, rectal exam showed melena and was FOBT positive. RN reports he had a few brown stools yesterday and one brown stool this morning.  Patient has history of hypotension, typically on midodrine. Seen by PCCM for hypotensive and AMS. With patient's MOLST form he was not transferred to ICU, PCCM put patient on midodrine and discussed comfort care rather than pressors.  ED course On arrival to the ED, temperature 97.5 F, heart rate 101, respiratory rate 18, blood pressure 155/120, SpO2 100% on 3 L [facility].  Labs showing no leukocytosis.  Hemoglobin 7.4 (was ranging between 7.9-10.3 on labs done last month), MCV 104.5. He had melena on rectal exam and FOBT positive.  Patient was not hypoglycemic in the ED.  Troponin 40> 48.    Previous GI workup -06/15/22 s/p I&D of the abscess in December. Cultures +MDR E.coli. Completed several weeks of antibiotics. D/C to SNF for rehab.  - 09/10/22 CTA (-)active bleed. (+)distended GB with stones vs sludge.  --09/11/22 EGD "small stomach nodule," 2 duodenal polyps. No biopsies and none removed due to concern for acute GIB. Colonoscopy 15mm polyp in cecum. 10mm polyp in transverse colon s/p removal. Path +tubular adenoma. Rectal  AVMs/radiation proctitis s/p APC thought to be source of bleed. Plans were made for repeat EGD after outpatient follow up with GI. Advised to resume Eliquis after D/C. --09/17/22 colonoscopy recurrent bleeding of rectal AVMs s/p APC. Stigmata of bleeding from prior polyp removal s/p clips. Advised to resume Eliquis 5 days after D/C.   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) 12/07/2019   Dialysis patient [facility])    Gout    Hypercalcemia 11/14/2021   Hyperkalemia 10/19/2018   Hypertension    Hypotension 06/13/2022   Right posterior capsular opacification 02/27/2021   Septic shock (HCC)    Vitreous hemorrhage of right eye (HCC) 10/24/2019    Surgical History:  He  has a past surgical history that includes Shoulder surgery (Left, 1994); Knee surgery (Left, 1998); excision bone spurs (Bilateral, 1989); AV fistula placement (Left, 05/31/2015); A/V Fistulagram (Left, 08/17/2017); A/V SHUNT INTERVENTION (N/A, 08/17/2017); Incision and drainage perirectal abscess (N/A, 06/15/2022); Esophagogastroduodenoscopy (egd) with propofol (N/A, 09/11/2022); Colonoscopy with propofol (N/A, 09/11/2022); polypectomy (09/11/2022); Hemostasis clip placement (09/11/2022); Hot hemostasis (N/A, 09/11/2022); Colonoscopy with propofol (N/A, 09/17/2022); Hemostasis clip placement (09/17/2022); and Hot hemostasis (N/A, 09/17/2022). Family History:  His family history includes Alcoholism in his brother and father; Kidney disease in his mother. Social History:   reports that he quit smoking about 44 years ago. His smoking use included cigarettes. He has never used smokeless tobacco. He reports that he does not drink alcohol and does not use drugs.  Prior to Admission medications   Medication Sig Start Date End Date Taking? Authorizing Provider  aspirin EC 81 MG tablet Take 81 mg by mouth daily. 11/06/22  Yes [provider]  b complex-vitamin c-folic acid (NEPHRO-VITE) 0.8 MG TABS tablet Take 1 tablet by mouth at bedtime.   Yes [provider]  ELIQUIS 5 MG TABS tablet Take 1 tablet (5 mg total) by mouth 2 (two) times daily. Resume Your Eliquis after 5 days 09/23/22  Yes Kc, Dayna Barker, MD   midodrine (PROAMATINE) 5 MG tablet Take 10 mg by mouth in the morning, at noon, and at bedtime. Prior to dialysis M-W-F 08/13/22  Yes [provider]  Olopatadine HCl (PATADAY) 0.2 % SOLN Place 1 drop into both eyes daily.   Yes [provider]  omeprazole (PRILOSEC) 20 MG capsule Take 20 mg by mouth every morning.    Yes [provider]  pantoprazole (PROTONIX) 40 MG tablet Take 40 mg by mouth daily. 11/04/22  Yes [provider]  pregabalin (LYRICA) 75 MG capsule Take 1 capsule (75 mg total) by mouth 2 (two) times daily. Patient taking differently: Take 75 mg by mouth at bedtime. 06/26/22  Yes Tat, Onalee Hua, MD  VELPHORO 500 MG chewable tablet Chew 500 mg by mouth 3 (three) times daily with meals. 06/03/22  Yes [provider]  allopurinol (ZYLOPRIM) 100 MG tablet Take 100 mg by mouth daily. Patient not taking: Reported on 11/09/2022 09/13/21   [provider]  AMBULATORY NON FORMULARY MEDICATION Medication Name: Sucralfate 50 mg suppository- Insert 1 suppository into rectum twice daily x 14  days. Patient not taking: Reported on 11/09/2022 09/18/22   Benancio Deeds, MD  amiodarone (PACERONE) 200 MG tablet Take 1 tablet (200 mg total) by mouth 2 (two) times daily. On 07/09/22, start amiodarone 200 mg once daily Patient not taking: Reported on 11/09/2022 06/26/22   Catarina Hartshorn, MD  atorvastatin (LIPITOR) 40 MG tablet Take 40 mg by mouth daily.  Patient not taking: Reported on 11/09/2022 10/30/14   [provider]  cinacalcet (SENSIPAR) 30 MG tablet Take 30 mg by mouth daily. Patient not taking: Reported on 11/09/2022 08/25/22   [provider]  HYDROcodone-acetaminophen (NORCO/VICODIN) 5-325 MG tablet Take 1 tablet by mouth every 6 (six) hours as needed for severe pain. Patient not taking: Reported on 11/09/2022 06/26/22   TatOnalee Hua, MD  [facility] 10 g PACK packet Take 10 g by mouth 2 (two) times a week. Tuesday and Thursday Patient not  taking: Reported on 11/09/2022 06/08/22   [provider]  Methoxy PEG-Epoetin Beta (MIRCERA IJ) Mircera Patient not taking: Reported on 11/09/2022 06/13/21 12/18/22  [provider]  metoprolol tartrate (LOPRESSOR) 25 MG tablet Take 0.5 tablets (12.5 mg total) by mouth 2 (two) times daily. Patient not taking: Reported on 11/09/2022 06/26/22   Catarina Hartshorn, MD    Current Facility-Administered Medications  Medication Dose Route Frequency Provider Last Rate Last Admin   0.9 %  sodium chloride infusion   Intravenous Continuous Mansouraty, Netty Starring., MD       acetaminophen (TYLENOL) tablet 650 mg  650 mg Oral Q6H PRN John Giovanni, MD       Or   acetaminophen (TYLENOL) suppository 650 mg  650 mg Rectal Q6H PRN John Giovanni, MD       insulin aspart (novoLOG) injection 0-9 Units  0-9 Units Subcutaneous Q4H John Giovanni, MD       midodrine (PROAMATINE) tablet 10 mg  10 mg Oral TID WC Lorin Glass, MD   10 mg at 11/09/22 0231   pantoprazole (PROTONIX) injection 40 mg  40 mg Intravenous Clementeen Hoof, MD   40 mg at 11/09/22 0150    Allergies as of 11/08/2022   (No Known Allergies)    Review of Systems  Unable to perform ROS: Mental status change       Physical Exam:  Vital signs in last 24 hours: Temp:  [97.4 F (36.3 C)-98.1 F (36.7 C)] 97.4 F (36.3 C) (04/29 0318) Pulse Rate:  [106-109] 109 (04/29 0345) Resp:  [0-27] 16 (04/29 0345) BP: (77-155)/(48-132) 126/59 (04/29 0318) SpO2:  [96 %-100 %] 100 % (04/29 0345) Last BM Date : 11/08/22 Last BM recorded by nurses in past 5 days No data recorded  Physical Exam Constitutional:      Comments: Nonresponsive, opens eyes to voice but does not follow commands  HENT:     Head: Normocephalic and atraumatic.     Nose: Nose normal. No congestion.  Eyes:     General: No scleral icterus.    Extraocular Movements: Extraocular movements intact.  Cardiovascular:     Rate and Rhythm: Normal rate  and regular rhythm.  Pulmonary:     Effort: Pulmonary effort is normal.     Breath sounds: Normal breath sounds.  Abdominal:     General: Abdomen is flat. Bowel sounds are normal. There is no distension.     Palpations: Abdomen is soft. There is no mass.     Tenderness: There is no abdominal tenderness. There is no guarding or rebound.  Hernia: No hernia is present.     Comments: RUQ drain in place with bilious drainage  Musculoskeletal:        General: No swelling. Normal range of motion.     Cervical back: Normal range of motion and neck supple.  Skin:    General: Skin is warm and dry.  Neurological:     Comments: Opens eyes to voice but does not follow commands, somnolent      LAB RESULTS: Recent Labs    11/08/22 1845 11/09/22 0259 11/09/22 0355  WBC 9.6  --  9.3  HGB 7.4* 9.9* 7.1*  HCT 23.0* 29.0* 22.7*  PLT 175  --  151   BMET Recent Labs    11/08/22 2020 11/09/22 0259 11/09/22 0355  NA 138 139 138  K 4.3 4.3 4.3  CL 98  --  98  CO2 24  --  24  GLUCOSE 154*  --  113*  BUN 42*  --  41*  CREATININE 8.41*  --  8.49*  CALCIUM 8.8*  --  8.9   LFT Recent Labs    11/08/22 2020  PROT 6.8  ALBUMIN 2.0*  AST 27  ALT 32  ALKPHOS 84  BILITOT 1.0   PT/INR No results for input(s): "LABPROT", "INR" in the last 72 hours.  STUDIES: CT HEAD WO CONTRAST ( )  Result Date: 11/09/2022 CLINICAL DATA:  Mental status change, unknown cause. EXAM: CT HEAD WITHOUT CONTRAST TECHNIQUE: Contiguous axial images were obtained from the base of the skull through the vertex without intravenous contrast. RADIATION DOSE REDUCTION: This exam was performed according to the departmental dose-optimization program which includes automated exposure control, adjustment of the mA and/or kV according to patient size and/or use of iterative reconstruction technique. COMPARISON:  06/22/2022. FINDINGS: Brain: No acute intracranial hemorrhage, midline shift or mass effect. No extra-axial fluid  collection. Periventricular white matter hypodensities are present bilaterally. No hydrocephalus. Vascular: No hyperdense vessel or unexpected calcification. Skull: Normal. Negative for fracture or focal lesion. Sinuses/Orbits: No acute finding. Other: None. IMPRESSION: 1. No acute intracranial process. 2. Chronic microvascular ischemic changes. Electronically Signed   By: Thornell Sartorius M.D.   On: 11/09/2022 03:16   DG Chest Port 1 View  Result Date: 11/08/2022 CLINICAL DATA:  Syncope EXAM: PORTABLE CHEST 1 VIEW COMPARISON:  09/25/2022 FINDINGS: Mild cardiomegaly. Worsened bilateral lower lung airspace disease. Possible small left effusion. Aortic atherosclerosis. No pneumothorax. Left axillary vascular stent IMPRESSION: Worsened bilateral lower lung airspace disease which may be due to atelectasis or pneumonia. Possible small left effusion. Mild cardiomegaly. Electronically Signed   By: Jasmine Pang M.D.   On: 11/08/2022 19:28      Impression    Acute on chronic anemia, melena -Hgb 7.1 (baseline 7.9-10.3) - Melena on rectal exam in ED and FOBT positive - EGD 3/1: normal esophagus, hiatal hernia, submucosal bodulei n stomach, duodenal polyps (no biopsies due to concern for acute GIB) - Underwent EGD 4/1 that revealed no abnormalities apart from submucosal gastric mass.  Biopsies were done.  Biopsies showed inconclusive result per pathologist who recommended deeper biopsy samples in the future Patient currently unable to follow commands, only responds to voice. Has MOLST form with avoidance of ICU so PCCM is managing BP accordingly. patient would need consent from family (niece listed in chart) if we were to do a procedure unless emergently indicated. No overt bleeding per RN report with brown stools. At this time, patient is hemodynamically stable. Currently receiving 1 unit pRBCs  Syncope(?)  AMS -Facility staff perform CPR.  When EMS arrived patient was awake and had a pulse. Patient did not have  oxygen on when EMS arrived and sats were low 70s. - no hypotension or hypoglycemia in ED - CT head showed chronic microvascular ischemic changes, otherwise unrevealing - EEG ordered - Ammonia 37  Paroxysmal A-fib - holding Eliquis  ESRD on HD  QT prolongation DM2    Plan   - Continue daily CBC and transfuse as needed to maintain HGB > 7  - Protonix 40mg  IV BID - hesitant towards procedures at this time, may reconsider if overt bleeding or significant drop in hgb.  - Once patient is more stabilized/alert, will reconsider procedures. Wouldn't be able to tolerate prep at this time and recent EGD (4/1) was overall unrevealing.   Thank you for your kind consultation, we will continue to follow.   Bayley Leanna Sato  11/09/2022, 10:01 AM

## 2022-11-09 NOTE — Progress Notes (Signed)
Pharmacy Antibiotic Note  SELBY FOISY is a 77 y.o. male admitted on 11/08/2022 with pneumonia.  Pharmacy has been consulted for vancomycin dosing. He is afebrile, WBC are normal, and he is on HD MWF. CXR shows possible PNA.   Plan: Vancomycin 2000 mg IV load then 1000 mg after HD on MWF Cefepime 1 g IV q24h Monitor for HD schedule changes, clinical progress, cultures/sensitivities Plan is for 5 days       Temp (24hrs), Avg:97.8 F (36.6 C), Min:97.4 F (36.3 C), Max:98.5 F (36.9 C)  Recent Labs  Lab 11/08/22 1845 11/08/22 2020 11/09/22 0355  WBC 9.6  --  9.3  CREATININE  --  8.41* 8.49*    CrCl cannot be calculated (Unknown ideal weight.).    No Known Allergies   Thank you for involving pharmacy in this patient's care.  Loura Back, PharmD, BCPS Clinical Pharmacist Clinical phone for 11/09/2022 is [phone] 11/09/2022 3:24 PM

## 2022-11-09 NOTE — Progress Notes (Addendum)
Pt received from ED after critical ABG values resulted. Upon arrival to room Pt presented no signs of respiratory distress. Pt awake & responding appropriately. Pt currently watching TV and on 3L [place]. No BIPAP/CPAP orders placed @ this time. RT to follow.

## 2022-11-09 NOTE — Progress Notes (Signed)
Patient consents to blood after discussion of risks and benefits; he has had blood transfusions multiple times in past.

## 2022-11-09 NOTE — Progress Notes (Signed)
Mr. Isip denies complaints and is feeling well this afternoon.  He reports feeling better than at admission.  BP (!) 85/61   Pulse (!) 107   Temp 98.5 F (36.9 C) (Oral)   Resp 18   SpO2 100%  Chronically ill-appearing man lying in bed no acute distress [place]/AT, eyes anicteric Awake and alert, answering questions appropriately, normal speech. Breathing comfortably on nasal cannula S1-S2, irregular rhythm, mild tachycardia Abdomen nondistended  Appears stable.  Although he has low blood pressures he has chronic hypotension and seems to be tolerating this well.  No need for more aggressive pulmonary support.  Per overnight notes it would appear that he would not want more aggressive ICU level care per his MOST form if he deteriorated.  PCCM will be available as needed.  Please call if questions arise.  Steffanie Dunn, DO 11/09/22 4:53 PM [facility]  For contact information, see Amion. If no response to pager, please call PCCM consult pager. After hours, 7PM- 7AM, please call Elink.

## 2022-11-09 NOTE — ED Notes (Signed)
MD notified of BP through secure chat

## 2022-11-09 NOTE — Progress Notes (Signed)
EEG complete - results pending 

## 2022-11-09 NOTE — Progress Notes (Signed)
Progress Note   Patient: Justin [name] WUJ:[id number] DOB: 12-13-1945 DOA: 11/08/2022     0 DOS: the patient was seen and examined on 11/09/2022   Brief hospital course: Justin [name] is a 77 y.o. 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  Patient was recently admitted to [facility] 4/2-4/23 for acute cholecystitis/septic shock and NSTEMI.  He was initially in the ICU requiring vasopressor support.  General surgery had decided against surgical intervention due to patient's history of aortic stenosis.  He was treated with antibiotics and underwent cholecystectomy tube placement by IR.  Ultimately discharged to SNF with cholecystectomy tube in place.  He was requiring supplemental oxygen at the time of discharge.  Per review of discharge summary, had biopsy of stomach/fundus mass done at [facility] and reportedly results inconclusive per pathologist who recommended deeper biopsy samples in the future.  Patient Justin [name] of her syncopal episode.  Upon initial examination patient was found to be hypoxic hemoglobin was 7.4 and melena on rectal examination.  Chest x-ray also showed worsening bilateral airspace disease due to atelectasis or pneumonia.  Assessment and Plan:  Syncope Altered mental status -Patient still continues to be very somnolent. - CT of the head is negative for any acute abnormalities - TSH within normal limits, ammonia is 37. - EEG ordered and shows no epileptiform activity and signs of generalized encephalopathy. -ABG shows mild hypercapnia.Will order BIPAP   Acute GI bleed, likely upper Acute on chronic anemia  secondary to blood loss Hemoglobin currently 7.4, was ranging between 7.9-10.3 on labs done last month.  He had melena on rectal exam done in the ED and FOBT positive.  ED physician has consulted [facility].  EGD done last month showing normal esophagus, hiatal hernia, single submucosal papule (nodule) found in the stomach, and 2 duodenal polyps.  Keep n.p.o., type and screen, monitor H&H, hold Eliquis, and start IV Protonix 40 mg every 12 hours. -Hemoglobin this morning is 7.1 ordered transfusion of 1 unit of packed red blood cell. -Gastroenterologist evaluated the patient, we are planning for video capsule endoscopy to evaluate for additional small bowel pathology.   Hypotension: - Patient has been started on midodrine monitor blood pressure after the initial dose.  ?Pneumonia Chest x-ray showing worsening bilateral lower lung airspace disease which may be due to atelectasis or pneumonia, possible small left effusion, and mild cardiomegaly.  No fever or leukocytosis.  Last echo done in December 2023 showing EF 60 to 65%, however, he appears volume overloaded on exam with 3+ bilateral lower extremity edema.  Check BNP and procalcitonin.  SARS-CoV-2 PCR. -Since however procalcitonin elevated started the patient on IV cefepime and vancomycin   Paroxysmal A-fib/flutter Hold Eliquis at this time.  Continue amiodarone after pharmacy med rec is done.   ESRD on HD MWF He does appear volume overloaded but satting well on 3 L nasal cannula.  Potassium and bicarb normal.  Consult nephrology in the morning.   QT prolongation Cardiac monitoring.  Potassium is within normal range, check magnesium level.  Avoid QT prolonging drugs and repeat EKG in a.m.   Type 2 diabetes A1c 6.5 in December 2023.  Sensitive sliding scale insulin every 4 hours.  DVT prophylaxis: SCDs Code Status: Unable to discuss CODE STATUS with the patient at this time due to his altered mental status.  No family available at this  time.  MOST form present at bedside.      Subjective: Patient seen and examined at bedside today.  Patient is confused but opens eyes and makes eye contact and is monitoring fevers unable to understand.  Physical Exam: Vitals:   11/09/22 0345 11/09/22 1230 11/09/22 1245 11/09/22 1300  BP:   (!) 68/44 (!) 85/61  Pulse: (!) 109 (!) 108 (!) 108 (!) 107  Resp: 16 18    Temp:  98 F (36.7 C) 98 F (36.7 C) 98.5 F (36.9 C)  TempSrc:  Oral  Oral  SpO2: 100% 100% 100% 100%   Vitals reviewed.  Constitutional:      General: He is not in acute distress. HENT:     Head: Normocephalic and atraumatic.  Eyes:     Comments: Unable to examine due to lack of patient cooperation  Cardiovascular:     Rate and Rhythm: Normal rate and regular rhythm.     Pulses: Normal pulses.     Heart sounds: Murmur heard.  Pulmonary:     Effort: Pulmonary effort is normal. No respiratory distress.     Breath sounds: No wheezing or rales.  Abdominal:     General: Bowel sounds are normal. There is no distension.     Palpations: Abdomen is soft.     Tenderness: There is no abdominal tenderness.  Musculoskeletal:     Cervical back: Normal range of motion.     Right lower leg: Edema present.     Left lower leg: Edema present.     Comments: 3+ pitting edema of bilateral lower extremities  Skin:    General: Skin is warm and dry.  Neurological:     Comments: Very somnolent    Data Reviewed:  ABG showing hypercapnia  Family Communication: None at bedside  Disposition: Status is: Inpatient Remains inpatient appropriate because: Altered mental status, hypotension, anemia  Planned Discharge Destination: Skilled nursing facility    Time spent: 35 minutes  Author: Harold Hedge, MD 11/09/2022 2:47 PM  For on call review [email or web address].

## 2022-11-09 NOTE — Progress Notes (Signed)
PHARMACY NOTE:  ANTIMICROBIAL RENAL DOSAGE ADJUSTMENT  Current antimicrobial regimen includes a mismatch between antimicrobial dosage and estimated renal function.  As per policy approved by the Pharmacy & Therapeutics and Medical Executive Committees, the antimicrobial dosage will be adjusted accordingly.  Current antimicrobial dosage:  Cefepime 1gm Iv q12h  Indication: HCAP  Renal Function:  CrCl cannot be calculated (Unknown ideal weight.). [x]      On intermittent HD, scheduled: []      On CRRT    Antimicrobial dosage has been changed to:  Cefepime 1gm Iv q24h  Additional comments:   Fortunato Nordin A. Jeanella Craze, PharmD, BCPS, FNKF Clinical Pharmacist  Please utilize Amion for appropriate phone number to reach the unit pharmacist [facility])  11/09/2022 3:06 PM

## 2022-11-09 NOTE — Consult Note (Signed)
Renal Service Consult Note [name]  Justin [name] 11/09/2022 Justin Krabbe, MD Requesting Physician: Dr. Quincy [name]       Reason for Consult: ESRD pt w/ syncopal episode , poss brief cardiac arrest HPI: The patient is a 77 y.o. year-old w/ PMH as below who presented from SNF after suffering a LOC episode. Staff there described pt all of a sudden lost consciousness and they did about 4 min of CPR. When EMS arrived he had a pulse. In ED trop neg, EKG wnl, BP's soft on chronic midodrine. Pt was somnolent, labs were okay. ABG showed pCO2 57. Head CT ordered and TSH and NH3. We were asked to see for ESRD.    Pt seen in room, responds to loud voice saying he is in "[place]" then falls back asleep. Some snoring, no obvious apnea. Poor historian.   Pt was at [facility] for about 3 weeks  from last days of March to  about 4/23. Has not missed any OP HD before or after that admission.   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) 12/07/2019   Dialysis patient (HCC)    Gout    Hypercalcemia 11/14/2021   Hyperkalemia 10/19/2018   Hypertension    Hypotension 06/13/2022   Right posterior capsular opacification 02/27/2021   Septic shock (HCC)    Vitreous hemorrhage of right eye (HCC) 10/24/2019   Past Surgical History  Past Surgical History:  Procedure Laterality Date   A/V FISTULAGRAM Left 08/17/2017   Procedure: A/V  FISTULAGRAM;  Surgeon: Justin Dills, MD;  Location: [facility] INVASIVE CV LAB;  Service: Cardiovascular;  Laterality: Left;   A/V SHUNT INTERVENTION N/A 08/17/2017   Procedure: A/V SHUNT INTERVENTION;  Surgeon: Justin Dills, MD;  Location: [facility] INVASIVE CV LAB;  Service: Cardiovascular;  Laterality: N/A;   AV FISTULA PLACEMENT Left 05/31/2015   Procedure: ARTERIOVENOUS (AV) FISTULA CREATION;  Surgeon: Justin Dills, MD;  Location: [facility] ORS;  Service: Vascular;  Laterality: Left;   COLONOSCOPY WITH PROPOFOL N/A 09/11/2022   Procedure: COLONOSCOPY WITH PROPOFOL;  Surgeon: Justin Burn, MD;  Location: [facility] ENDOSCOPY;  Service: Gastroenterology;  Laterality: N/A;   COLONOSCOPY WITH PROPOFOL N/A 09/17/2022   Procedure: COLONOSCOPY WITH PROPOFOL;  Surgeon: Justin Deeds, MD;  Location: [facility] ENDOSCOPY;  Service: Gastroenterology;  Laterality: N/A;   ESOPHAGOGASTRODUODENOSCOPY (EGD) WITH PROPOFOL N/A 09/11/2022   Procedure: ESOPHAGOGASTRODUODENOSCOPY (EGD) WITH PROPOFOL;  Surgeon: Justin Burn, MD;  Location: [facility] ENDOSCOPY;  Service: Gastroenterology;  Laterality: N/A;   excision bone spurs Bilateral 1989   feet   HEMOSTASIS CLIP PLACEMENT  09/11/2022   Procedure: HEMOSTASIS CLIP PLACEMENT;  Surgeon: Justin Burn, MD;  Location: [facility] ENDOSCOPY;  Service: Gastroenterology;;   HEMOSTASIS CLIP PLACEMENT  09/17/2022   Procedure: HEMOSTASIS CLIP PLACEMENT;  Surgeon: Justin Deeds, MD;  Location: [facility] ENDOSCOPY;  Service: Gastroenterology;;   HOT HEMOSTASIS N/A 09/11/2022  Procedure: HOT HEMOSTASIS (ARGON PLASMA COAGULATION/BICAP);  Surgeon: Justin Burn, MD;  Location: [facility] ENDOSCOPY;  Service: Gastroenterology;  Laterality: N/A;   HOT HEMOSTASIS N/A 09/17/2022   Procedure: HOT HEMOSTASIS (ARGON PLASMA COAGULATION/BICAP);  Surgeon: Justin Deeds, MD;  Location: [facility] ENDOSCOPY;  Service: Gastroenterology;  Laterality: N/A;   INCISION AND DRAINAGE PERIRECTAL ABSCESS N/A 06/15/2022   Procedure:  IRRIGATION AND DEBRIDEMENT PERIRECTAL ABSCESS;  Surgeon: Justin Macho, MD;  Location: [facility] ORS;  Service: General;  Laterality: N/A;   KNEE SURGERY Left 1998   arthroscopy   POLYPECTOMY  09/11/2022   Procedure: POLYPECTOMY;  Surgeon: Justin Burn, MD;  Location: [facility] ENDOSCOPY;  Service: Gastroenterology;;   SHOULDER SURGERY Left 1994   rotator cuff   Family History  Family History  Problem Relation Age of Onset   Kidney disease Mother    Alcoholism Father    Alcoholism Brother    Social History  reports that he quit smoking about 44 years ago. His smoking use included cigarettes. He has never used smokeless tobacco. He reports that he does not drink alcohol and does not use drugs. Allergies No Known Allergies Home medications Prior to Admission medications   Medication Sig Start Date End Date Taking? Authorizing Provider  aspirin EC 81 MG tablet Take 81 mg by mouth daily. 11/06/22  Yes [provider]  b complex-vitamin c-folic acid (NEPHRO-VITE) 0.8 MG TABS tablet Take 1 tablet by mouth at bedtime.   Yes [provider]  ELIQUIS 5 MG TABS tablet Take 1 tablet (5 mg total) by mouth 2 (two) times daily. Resume Your Eliquis after 5 days 09/23/22  Yes Kc, Dayna Barker, MD  midodrine (PROAMATINE) 5 MG tablet Take 10 mg by mouth in the morning, at noon, and at bedtime. Prior to dialysis M-W-F 08/13/22  Yes [provider]  Olopatadine HCl (PATADAY) 0.2 % SOLN Place 1 drop into both eyes daily.   Yes [provider]  omeprazole (PRILOSEC) 20 MG capsule Take 20 mg by mouth every morning.    Yes [provider]  pantoprazole (PROTONIX) 40 MG tablet Take 40 mg by mouth daily. 11/04/22  Yes [provider]  pregabalin (LYRICA) 75 MG capsule Take 1 capsule (75 mg total) by mouth 2 (two) times daily. Patient taking differently: Take 75 mg by mouth at bedtime. 06/26/22  Yes Tat, Onalee Hua, MD  VELPHORO 500 MG chewable tablet Chew 500 mg by mouth 3 (three) times  daily with meals. 06/03/22  Yes [provider]  allopurinol (ZYLOPRIM) 100 MG tablet Take 100 mg by mouth daily. Patient not taking: Reported on 11/09/2022 09/13/21   [provider]  AMBULATORY NON FORMULARY MEDICATION Medication Name: Sucralfate 50 mg suppository- Insert 1 suppository into rectum twice daily x 14 days. Patient not taking: Reported on 11/09/2022 09/18/22   Justin Deeds, MD  amiodarone (PACERONE) 200 MG tablet Take 1 tablet (200 mg total) by mouth 2 (two) times daily. On 07/09/22, start amiodarone 200 mg once daily Patient not taking: Reported on 11/09/2022 06/26/22   Catarina Hartshorn, MD  atorvastatin (LIPITOR) 40 MG tablet Take 40 mg by mouth daily.  Patient not taking: Reported on 11/09/2022 10/30/14   [provider]  cinacalcet (SENSIPAR) 30 MG tablet Take 30 mg by mouth daily. Patient not taking: Reported on 11/09/2022 08/25/22   [provider]  HYDROcodone-acetaminophen (NORCO/VICODIN) 5-325 MG tablet Take 1 tablet by mouth every 6 (six) hours as needed for severe pain. Patient not taking: Reported on  11/09/2022 06/26/22   Catarina Hartshorn, MD  LOKELMA 10 g PACK packet Take 10 g by mouth 2 (two) times a week. Tuesday and Thursday Patient not taking: Reported on 11/09/2022 06/08/22   [provider]  Methoxy PEG-Epoetin Beta (MIRCERA IJ) Mircera Patient not taking: Reported on 11/09/2022 06/13/21 12/18/22  [provider]  metoprolol tartrate (LOPRESSOR) 25 MG tablet Take 0.5 tablets (12.5 mg total) by mouth 2 (two) times daily. Patient not taking: Reported on 11/09/2022 06/26/22   Catarina Hartshorn, MD     Vitals:   11/09/22 0345 11/09/22 1230 11/09/22 1245 11/09/22 1300  BP:   (!) 68/44 (!) 85/61  Pulse: (!) 109 (!) 108 (!) 108 (!) 107  Resp: 16 18    Temp:  98 F (36.7 C) 98 F (36.7 C) 98.5 F (36.9 C)  TempSrc:  Oral  Oral  SpO2: 100% 100% 100% 100%   Exam Gen no distress, sleeping, difficult to arouse No rash, cyanosis or  gangrene Sclera anicteric, throat clear  No jvd or bruits Chest clear bilat to bases, no rales/ wheezing RRR no MRG Abd soft ntnd no mass or ascites +bs GU normal male MS no joint effusions or deformity Ext bilat 2+ LE edema, 1-2+ left UE edema (AVF arm) Neuro is as above    LUA AVF+bruit     Home meds include - asa, eliquis, modidrine 10mg  pre hd mwf or tid, prilosec, protonix, lyrica, velphoro 500mg  ac tid, zyloprim, amiodarone, lipitor, sensipar 30 qd, norco prn, lokelma 10gm biw, lopressor 12.5 bid, prns/ vits/ supps    [facility] 3h  450/600  99kg  2/2.5 bath  LUA AVF  Hep none - last HD 4/26, post wt 111kg, coming off 10+kg over last 2 sessions - rocaltrol 0.75 mcg po tiw - venofer 100mg  tiw thru 5/08 - mircera 200 mcg IV q 2 wks, last 4/26 - last Hb 7.7 on 4/24   Assessment/ Plan: LOC episode/ AMS - had brief CPR x 4 min at SNF before EMS arrived. When EMS arrived he had a pulse. Per pmd SP cholecystotomy - done at recent admit at [facility].  Anemia - Hb not far from recent outpatient labs lately (7-8s). Possible GIB per pmd.  Acute hypoxic resp failure - per CXR looks like CHF/ vol overload, on 3 L [place]. Max UF w/ HD tonight.  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 Has not missed HD. Had tonight or possibly tomorrow.   HTN/ volume - vol overload, came off 10kg + last OP HD and edematous here w/ CHF by CXR. Max UF as tol Anemia esrd - Hb here in 7s, close to OP recent labs. Not due for esa soon. Follow.  MBD ckd - CCa in range, will add on phos.       Vinson Moselle  MD [facility] 11/09/2022, 4:49 PM  Recent Labs  Lab 11/08/22 2020 11/09/22 0259 11/09/22 0355  HGB  --  9.9* 7.1*  ALBUMIN 2.0*  --   --   CALCIUM 8.8*  --  8.9  CREATININE 8.41*  --  8.49*  K 4.3 4.3 4.3   Inpatient medications:  sodium chloride   Intravenous Once   Chlorhexidine Gluconate Cloth  6 each Topical Q0600   insulin aspart  0-9 Units Subcutaneous Q4H   midodrine  10 mg Oral TID WC   pantoprazole  (PROTONIX) IV  40 mg Intravenous Q12H    sodium chloride     ceFEPime (MAXIPIME) IV     [  START ON 11/11/2022] vancomycin     vancomycin     acetaminophen **OR** acetaminophen

## 2022-11-10 ENCOUNTER — Encounter (HOSPITAL_COMMUNITY)
Admission: EM | Disposition: A | Discharge status: Skilled Nursing Facility | Payer: Self-pay | Source: Skilled Nursing Facility | Attending: Internal Medicine

## 2022-11-10 DIAGNOSIS — R55 Syncope and collapse: Secondary | ICD-10-CM | POA: Diagnosis not present

## 2022-11-10 HISTORY — PX: GIVENS CAPSULE STUDY: SHX5432

## 2022-11-10 LAB — CBC WITH DIFFERENTIAL/PLATELET
Abs Immature Granulocytes: 0.03 10*3/uL (ref 0.00–0.07)
Basophils Absolute: 0 10*3/uL (ref 0.0–0.1)
Basophils Relative: 0 %
Eosinophils Absolute: 0.3 10*3/uL (ref 0.0–0.5)
Eosinophils Relative: 3 %
HCT: 24.2 % — ABNORMAL LOW (ref 39.0–52.0)
Hemoglobin: 8 g/dL — ABNORMAL LOW (ref 13.0–17.0)
Immature Granulocytes: 0 %
Lymphocytes Relative: 12 %
Lymphs Abs: 1.1 10*3/uL (ref 0.7–4.0)
MCH: 34 pg (ref 26.0–34.0)
MCHC: 33.1 g/dL (ref 30.0–36.0)
MCV: 103 fL — ABNORMAL HIGH (ref 80.0–100.0)
Monocytes Absolute: 0.8 10*3/uL (ref 0.1–1.0)
Monocytes Relative: 9 %
Neutro Abs: 6.6 10*3/uL (ref 1.7–7.7)
Neutrophils Relative %: 76 %
Platelets: 159 10*3/uL (ref 150–400)
RBC: 2.35 MIL/uL — ABNORMAL LOW (ref 4.22–5.81)
RDW: 19.9 % — ABNORMAL HIGH (ref 11.5–15.5)
WBC: 8.8 10*3/uL (ref 4.0–10.5)
nRBC: 0 % (ref 0.0–0.2)

## 2022-11-10 LAB — COMPREHENSIVE METABOLIC PANEL
ALT: 31 U/L (ref 0–44)
AST: 23 U/L (ref 15–41)
Albumin: 2.2 g/dL — ABNORMAL LOW (ref 3.5–5.0)
Alkaline Phosphatase: 79 U/L (ref 38–126)
Anion gap: 9 (ref 5–15)
BUN: 28 mg/dL — ABNORMAL HIGH (ref 8–23)
CO2: 27 mmol/L (ref 22–32)
Calcium: 8.5 mg/dL — ABNORMAL LOW (ref 8.9–10.3)
Chloride: 99 mmol/L (ref 98–111)
Creatinine, Ser: 6 mg/dL — ABNORMAL HIGH (ref 0.61–1.24)
GFR, Estimated: 9 mL/min — ABNORMAL LOW (ref >60)
Glucose, Bld: 97 mg/dL (ref 70–99)
Potassium: 3.5 mmol/L (ref 3.5–5.1)
Sodium: 135 mmol/L (ref 135–145)
Total Bilirubin: 1 mg/dL (ref 0.3–1.2)
Total Protein: 6.8 g/dL (ref 6.5–8.1)

## 2022-11-10 LAB — TYPE AND SCREEN: Antibody Screen: POSITIVE

## 2022-11-10 LAB — GLUCOSE, CAPILLARY
Glucose-Capillary: 69 mg/dL — ABNORMAL LOW (ref 70–99)
Glucose-Capillary: 82 mg/dL (ref 70–99)
Glucose-Capillary: 82 mg/dL (ref 70–99)
Glucose-Capillary: 85 mg/dL (ref 70–99)
Glucose-Capillary: 86 mg/dL (ref 70–99)
Glucose-Capillary: 88 mg/dL (ref 70–99)

## 2022-11-10 LAB — BPAM RBC
Blood Product Expiration Date: 202406052359
Blood Product Expiration Date: 202406052359

## 2022-11-10 LAB — HEPATITIS B SURFACE ANTIBODY, QUANTITATIVE: Hep B S AB Quant (Post): 1391 m[IU]/mL (ref >9.9)

## 2022-11-10 SURGERY — IMAGING PROCEDURE, GI TRACT, INTRALUMINAL, VIA CAPSULE

## 2022-11-10 MED ORDER — SUCROFERRIC OXYHYDROXIDE 500 MG PO CHEW
500.0000 mg | CHEWABLE_TABLET | Freq: Three times a day (TID) | ORAL | Status: DC
  Administered 2022-11-10 – 2022-11-24 (×32): 500 mg via ORAL
  Filled 2022-11-10 (×35): qty 1

## 2022-11-10 MED ORDER — CHLORHEXIDINE GLUCONATE CLOTH 2 % EX PADS
6.0000 | MEDICATED_PAD | Freq: Every day | CUTANEOUS | Status: DC
  Administered 2022-11-11 – 2022-11-19 (×7): 6 via TOPICAL

## 2022-11-10 MED ORDER — CINACALCET HCL 30 MG PO TABS
30.0000 mg | ORAL_TABLET | Freq: Every day | ORAL | Status: DC
  Administered 2022-11-10 – 2022-11-24 (×13): 30 mg via ORAL
  Filled 2022-11-10 (×15): qty 1

## 2022-11-10 NOTE — Progress Notes (Signed)
[facility] Progress Note  Subjective: seen in room. Had 3 L UF w/ HD overnight. Today is alert and talking.   Vitals:   11/10/22 0009 11/10/22 0227 11/10/22 0844 11/10/22 1135  BP: 95/61 (!) 114/59 (!) 102/55 93/64  Pulse: 75 (!) 109 (!) 109 (!) 109  Resp:  18 18 18   Temp:  98.3 F (36.8 C) 98.4 F (36.9 C) 98 F (36.7 C)  TempSrc:  Oral Axillary Axillary  SpO2: 100% 100% 94% 100%  Weight:        Exam: Gen no distress, alert and interacting today No rash, cyanosis or gangrene Sclera anicteric, throat clear  No jvd or bruits Chest clear bilat to bases, no rales/ wheezing RRR no MRG Abd soft ntnd no mass or ascites +bs Ext bilat 1+ LE edema, 1+ left UE edema, improving Neuro is as above    LUA AVF+bruit        Home meds include - asa, eliquis, midodrine 10mg  pre hd mwf or tid, prilosec, protonix, lyrica, velphoro 500mg  ac tid, zyloprim, amiodarone, lipitor, sensipar 30 qd, norco prn, lokelma 10gm biw, lopressor 12.5 bid, prns/ vits/ supps      OP HD: [facility] M[facility] 3h  450/600  99kg  2/2.5 bath  LUA AVF  Hep none - last HD 4/26, post wt 111kg, coming off 10+kg over last 2 sessions - rocaltrol 0.75 mcg po tiw - venofer 100mg  tiw thru 5/08 - mircera 200 mcg IV q 2 wks, last 4/26 - last Hb 7.7 on 4/24     Assessment/ Plan: LOC episode/ AMS - had brief CPR x 4 min at SNF before EMS arrived. Was somnolent yesterday on exam but today is alert and interacting.  SP cholecystotomy - during recent admit at [facility].  Anemia - Hb not far from recent outpatient labs lately (7-8s). Possible GIB per pmd.  Acute hypoxic resp failure/ vol overload - per CXR looked like CHF/ vol overload. Also came off 10kg + after last OP HD and was edematous on initial exam here. Got 3 L off yest as above, improving. Cont large UF goal w/ HD tomorrow.  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 HD tomorrow.  HTN - not taking any HTN meds at home or here.  Anemia esrd - Hb here in 7s, close to OP recent labs.  Continuing the outpt IV iron load while here. Not due for esa, follow.  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 Cont po vdra, sensipar qd and velphoro as binder.      Vinson Moselle MD [facility] 11/10/2022, 1:14 PM  Recent Labs  Lab 11/08/22 2020 11/09/22 0259 11/09/22 0355 11/09/22 1752 11/10/22 0127  HGB  --    < > 7.1* 8.1* 8.0*  ALBUMIN 2.0*  --   --   --  2.2*  CALCIUM 8.8*  --  8.9  --  8.5*  PHOS  --   --   --  7.2*  --   CREATININE 8.41*  --  8.49*  --  6.00*  K 4.3   < > 4.3  --  3.5   < > = values in this interval not displayed.   No results for input(s): "IRON", "TIBC", "FERRITIN" in the last 168 hours. Inpatient medications:  [START ON 11/11/2022] calcitRIOL  0.75 mcg Oral Q M,W,F-HD   Chlorhexidine Gluconate Cloth  6 each Topical Q0600   insulin aspart  0-9 Units Subcutaneous Q4H   midodrine  10 mg Oral TID  WC   pantoprazole (PROTONIX) IV  40 mg Intravenous Q12H    sodium chloride     ceFEPime (MAXIPIME) IV Stopped (11/09/22 1753)   [START ON 11/11/2022] ferric gluconate (FERRLECIT) IVPB     [START ON 11/11/2022] vancomycin     acetaminophen **OR** acetaminophen

## 2022-11-10 NOTE — TOC Progression Note (Addendum)
Transition of Care [facility]) - Progression Note    Patient Details  Name: Justin [name] MRN: [id number] Date of Birth: [birth year]  Transition of Care [facility]) CM/SW Contact  Eduard Roux, [place] Phone Number: 11/10/2022, 4:02 PM  Clinical Narrative:     4:05pm UPDATED- Nice returned call- she confirmed patient is from [facility] and wants the patient to return once he is medically stable.   [facility] confirmed  patient is short term rehab and will ned re-authorization prior to d/c when stable.   4:00pm-Called patient's nice- left voice message to return call.         Expected Discharge Plan and Services                                               Social Determinants of Health (SDOH) Interventions SDOH Screenings   Food Insecurity: No Food Insecurity (09/16/2022)  Housing: Low Risk  (09/16/2022)  Transportation Needs: No Transportation Needs (09/16/2022)  Utilities: Not At Risk (09/16/2022)  Tobacco Use: Medium Risk (11/08/2022)    Readmission Risk Interventions    09/18/2022    4:10 PM 06/17/2022    2:47 PM  Readmission Risk Prevention Plan  Transportation Screening Complete Complete  HRI or Home Care Consult  Complete  Social Work Consult for Recovery Care Planning/Counseling  Complete  Palliative Care Screening  Not Applicable  Medication Review Oceanographer) Complete Complete  PCP or Specialist appointment within 3-5 days of discharge Complete   HRI or Home Care Consult Complete   Palliative Care Screening Complete   Skilled Nursing Facility Not Applicable

## 2022-11-10 NOTE — Progress Notes (Signed)
Progress Note   Patient: Justin [name]:[id number] DOB: 1946/01/18 DOA: 11/08/2022     1 DOS: the patient was seen and examined on 11/10/2022   Brief hospital course: Justin [name] is a 77 y.o. 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  Patient was recently admitted to [facility] 4/2-4/23 for acute cholecystitis/septic shock and NSTEMI.  He was initially in the ICU requiring vasopressor support.  General surgery had decided against surgical intervention due to patient's history of aortic stenosis.  He was treated with antibiotics and underwent cholecystectomy tube placement by IR.  Ultimately discharged to SNF with cholecystectomy tube in place.  He was requiring supplemental oxygen at the time of discharge.  Per review of discharge summary, had biopsy of stomach/fundus mass done at [facility] and reportedly results inconclusive per pathologist who recommended deeper biopsy samples in the future.  Patient Justin [name] of her syncopal episode.  Upon initial examination patient was found to be hypoxic hemoglobin was 7.4 and melena on rectal examination.  Chest x-ray also showed worsening bilateral airspace disease due to atelectasis or pneumonia. 4/30: Patient's mental status is improved, blood pressure has stabilized with midodrine and anemia/hemoglobin is stable.  Assessment and Plan:  Syncope Altered mental status -Patient still continues to be very somnolent. - CT of the head is negative for any acute abnormalities - TSH within normal limits, ammonia is 37. - EEG ordered and shows no epileptiform activity and signs of generalized  encephalopathy. -ABG shows mild hypercapnia.Will order BIPAP but patient did not want the BiPAP. - Today mental status is significantly improved patient is alert and oriented interacting well.   Acute GI bleed, likely upper Acute on chronic anemia secondary to blood loss Hemoglobin currently 7.4, was ranging between 7.9-10.3 on labs done last month.  He had melena on rectal exam done in the ED and FOBT positive.  ED physician has consulted [facility].  EGD done last month showing normal esophagus, hiatal hernia, single submucosal papule (nodule) found in the stomach, and 2 duodenal polyps.  Keep n.p.o., type and screen, monitor H&H, hold Eliquis, and start IV Protonix 40 mg every 12 hours. -Hemoglobin this morning is 7.1 ordered transfusion of 1 unit of packed red blood cell. Today  HB is 8.0 -Gastroenterologist evaluated the patient, planning for video capsule endoscopy to evaluate for additional small bowel pathology.   Hypotension: - Patient has been started on midodrine monitor blood pressure after the initial dose. - Patient has chronic hypotension, blood pressure will improved on midodrine.  ?Pneumonia Chest x-ray showing worsening bilateral lower lung airspace disease which may be due to atelectasis or pneumonia, possible small left effusion, and mild cardiomegaly.  No fever or leukocytosis.  Last echo done in December 2023 showing EF 60 to 65%, however, he appears volume overloaded on exam with 3+ bilateral lower extremity edema.   -Since however procalcitonin elevated started the patient on IV cefepime and vancomycin. -Patient is currently on 1.5 L oxygen via nasal cannula, respiratory status appears to be stable.   Paroxysmal A-fib/flutter Hold Eliquis at this time.  Continue amiodarone after pharmacy med rec is done.   ESRD on HD MWF He does appear volume overloaded but satting  well on 3 L nasal cannula.  Potassium and bicarb normal.  Consult nephrology in the morning.   QT  prolongation Cardiac monitoring.  Potassium is within normal range, check magnesium level.  Avoid QT prolonging drugs and repeat EKG in a.m.   Type 2 diabetes A1c 6.5 in December 2023.  Sensitive sliding scale insulin every 4 hours.  Blood sugars fairly well-controlled.   DVT prophylaxis: SCDs Code Status: Unable to discuss CODE STATUS with the patient at this time due to his altered mental status.  No family available at this time.  MOST form present at bedside.      Subjective: Patient seen and examined at bedside today.  Patient is much alert and interactive today.  He has no new complaints. Physical Exam: Vitals:   11/10/22 0009 11/10/22 0227 11/10/22 0844 11/10/22 1135  BP: 95/61 (!) 114/59 (!) 102/55 93/64  Pulse: 75 (!) 109 (!) 109 (!) 109  Resp:  18 18 18   Temp:  98.3 F (36.8 C) 98.4 F (36.9 C) 98 F (36.7 C)  TempSrc:  Oral Axillary Axillary  SpO2: 100% 100% 94% 100%  Weight:       Vitals reviewed.  Constitutional:      General: He is not in acute distress. HENT:     Head: Normocephalic and atraumatic.  Eyes:     Comments: Unable to examine due to lack of patient cooperation  Cardiovascular:     Rate and Rhythm: Normal rate and regular rhythm.     Pulses: Normal pulses.     Heart sounds: Murmur heard.  Pulmonary:     Effort: Pulmonary effort is normal. No respiratory distress.     Breath sounds: No wheezing or rales.  Abdominal:     General: Bowel sounds are normal. There is no distension.     Palpations: Abdomen is soft.     Tenderness: There is no abdominal tenderness.  Musculoskeletal:     Cervical back: Normal range of motion.     Right lower leg: Edema present.     Left lower leg: Edema present.     Comments: 3+ pitting edema of bilateral lower extremities  Skin:    General: Skin is warm and dry.  Neurological:     Comments: Very somnolent    Data Reviewed:  ABG showing hypercapnia  Family Communication: None at  bedside  Disposition: Status is: Inpatient Remains inpatient appropriate because: Altered mental status, hypotension, anemia  Planned Discharge Destination: Skilled nursing facility    Time spent: 35 minutes  Author: Harold Hedge, MD 11/10/2022 3:19 PM  For on call review [email or web address].

## 2022-11-10 NOTE — Plan of Care (Signed)
  Problem: Skin Integrity: Goal: Risk for impaired skin integrity will decrease Outcome: Progressing   

## 2022-11-10 NOTE — Progress Notes (Addendum)
Capsule swallowed for GI study at 1430

## 2022-11-11 DIAGNOSIS — K922 Gastrointestinal hemorrhage, unspecified: Secondary | ICD-10-CM

## 2022-11-11 DIAGNOSIS — J810 Acute pulmonary edema: Secondary | ICD-10-CM | POA: Diagnosis not present

## 2022-11-11 DIAGNOSIS — R55 Syncope and collapse: Secondary | ICD-10-CM | POA: Diagnosis not present

## 2022-11-11 DIAGNOSIS — K31819 Angiodysplasia of stomach and duodenum without bleeding: Secondary | ICD-10-CM

## 2022-11-11 LAB — CBC WITH DIFFERENTIAL/PLATELET
Abs Immature Granulocytes: 0.05 10*3/uL (ref 0.00–0.07)
Basophils Absolute: 0 10*3/uL (ref 0.0–0.1)
Basophils Relative: 0 %
Eosinophils Absolute: 0.2 10*3/uL (ref 0.0–0.5)
Eosinophils Relative: 2 %
HCT: 26.3 % — ABNORMAL LOW (ref 39.0–52.0)
Hemoglobin: 8.6 g/dL — ABNORMAL LOW (ref 13.0–17.0)
Immature Granulocytes: 1 %
Lymphocytes Relative: 15 %
Lymphs Abs: 1.2 10*3/uL (ref 0.7–4.0)
MCH: 34.3 pg — ABNORMAL HIGH (ref 26.0–34.0)
MCHC: 32.7 g/dL (ref 30.0–36.0)
MCV: 104.8 fL — ABNORMAL HIGH (ref 80.0–100.0)
Monocytes Absolute: 0.6 10*3/uL (ref 0.1–1.0)
Monocytes Relative: 8 %
Neutro Abs: 6 10*3/uL (ref 1.7–7.7)
Neutrophils Relative %: 74 %
Platelets: 174 10*3/uL (ref 150–400)
RBC: 2.51 MIL/uL — ABNORMAL LOW (ref 4.22–5.81)
RDW: 19.2 % — ABNORMAL HIGH (ref 11.5–15.5)
WBC: 8.1 10*3/uL (ref 4.0–10.5)
nRBC: 0 % (ref 0.0–0.2)

## 2022-11-11 LAB — COMPREHENSIVE METABOLIC PANEL
ALT: 27 U/L (ref 0–44)
AST: 22 U/L (ref 15–41)
Albumin: 2.2 g/dL — ABNORMAL LOW (ref 3.5–5.0)
Alkaline Phosphatase: 72 U/L (ref 38–126)
Anion gap: 14 (ref 5–15)
BUN: 33 mg/dL — ABNORMAL HIGH (ref 8–23)
CO2: 26 mmol/L (ref 22–32)
Calcium: 8.7 mg/dL — ABNORMAL LOW (ref 8.9–10.3)
Chloride: 99 mmol/L (ref 98–111)
Creatinine, Ser: 7.2 mg/dL — ABNORMAL HIGH (ref 0.61–1.24)
GFR, Estimated: 7 mL/min — ABNORMAL LOW (ref >60)
Glucose, Bld: 78 mg/dL (ref 70–99)
Potassium: 3.9 mmol/L (ref 3.5–5.1)
Sodium: 139 mmol/L (ref 135–145)
Total Bilirubin: 1.1 mg/dL (ref 0.3–1.2)
Total Protein: 7.1 g/dL (ref 6.5–8.1)

## 2022-11-11 LAB — GLUCOSE, CAPILLARY
Glucose-Capillary: 74 mg/dL (ref 70–99)
Glucose-Capillary: 74 mg/dL (ref 70–99)
Glucose-Capillary: 69 mg/dL — ABNORMAL LOW (ref 70–99)
Glucose-Capillary: 91 mg/dL (ref 70–99)
Glucose-Capillary: 87 mg/dL (ref 70–99)

## 2022-11-11 NOTE — Progress Notes (Signed)
[facility] Progress Note  Subjective: seen in room. Had 3 L UF w/ HD overnight. Today is alert and talking.   Vitals:   11/11/22 1133 11/11/22 1205 11/11/22 1238 11/11/22 1257  BP: (!) 109/57 115/80 121/67 111/73  Pulse: (!) 109 (!) 110 (!) 109 (!) 112  Resp: 15 16  18   Temp:  (!) 97.3 F (36.3 C)  (!) 97.4 F (36.3 C)  TempSrc:  Oral  Oral  SpO2: 100% 100% 100% 98%  Weight:  101.4 kg      Exam: Gen no distress, alert and interacting today No rash, cyanosis or gangrene Sclera anicteric, throat clear  No jvd or bruits Chest clear bilat to bases, no rales/ wheezing RRR no MRG Abd soft ntnd no mass or ascites +bs Ext bilat 1+ LE edema, 1+ left UE edema, improving Neuro is as above    LUA AVF+bruit        Home meds include - asa, eliquis, midodrine 10mg  pre hd mwf or tid, prilosec, protonix, lyrica, velphoro 500mg  ac tid, zyloprim, amiodarone, lipitor, sensipar 30 qd, norco prn, lokelma 10gm biw, lopressor 12.5 bid, prns/ vits/ supps      OP HD: [facility] 3h  450/600  99kg  2/2.5 bath  LUA AVF  Hep none - last HD 4/26, post wt 111kg, coming off 10+kg over last 2 sessions - rocaltrol 0.75 mcg po tiw - venofer 100mg  tiw thru 5/08 - mircera 200 mcg IV q 2 wks, last 4/26 - last Hb 7.7 on 4/24     Assessment/ Plan: LOC episode/ AMS - had brief CPR x 4 min at SNF before EMS arrived. Was somnolent here x 24 hrs then improved the next day. ABG showed pCO2 of 57. Not sure cause of AMS but has resolved. Per pmd.  SP cholecystotomy - during recent admit at [facility].  Anemia - Hb not far from recent outpatient labs lately (7-8s). Possible GIB per pmd.  Acute hypoxic resp failure/ vol overload - pt came off 10kg + after last OP HD and was edematous on initial exam here w/ CXR looking wet. Got 3 L off Monday and 3.5 L off today, now is 1-2 kg over dry wt. Will order O2 to be weaned off.  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 Had HD earlier today, only 2 kg over dry wt post HD HTN - not  taking any HTN meds at home or here.  Anemia esrd - Hb here in 7s, close to OP recent labs. Continuing the outpt IV iron load while here. Not due for esa, follow.  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 Cont po vdra, sensipar qd and velphoro as binder.  Dispo - okay for dc from renal standpoint, volume excess and AMS were main issues, both resolved it appears. Will d/w pmd.    Vinson Moselle MD [facility] 11/11/2022, 1:53 PM  Recent Labs  Lab 11/09/22 1752 11/10/22 0127 11/11/22 0214  HGB 8.1* 8.0* 8.6*  ALBUMIN  --  2.2* 2.2*  CALCIUM  --  8.5* 8.7*  PHOS 7.2*  --   --   CREATININE  --  6.00* 7.20*  K  --  3.5 3.9    No results for input(s): "IRON", "TIBC", "FERRITIN" in the last 168 hours. Inpatient medications:  calcitRIOL  0.75 mcg Oral Q M,W,F-HD   Chlorhexidine Gluconate Cloth  6 each Topical Q0600   cinacalcet  30 mg Oral Q supper   insulin aspart  0-9  Units Subcutaneous Q4H   midodrine  10 mg Oral TID WC   pantoprazole (PROTONIX) IV  40 mg Intravenous Q12H   sucroferric oxyhydroxide  500 mg Oral TID WC    ceFEPime (MAXIPIME) IV 1 g (11/10/22 2254)   ferric gluconate (FERRLECIT) IVPB Stopped (11/11/22 1236)   vancomycin Stopped (11/11/22 1237)   acetaminophen **OR** acetaminophen

## 2022-11-11 NOTE — Plan of Care (Signed)
  Problem: Skin Integrity: Goal: Risk for impaired skin integrity will decrease Outcome: Not Progressing   

## 2022-11-11 NOTE — Progress Notes (Signed)
Progress Note   Patient: Justin [name]:[id number] DOB: Aug 10, 1945 DOA: 11/08/2022     2 DOS: the patient was seen and examined on 11/11/2022   Brief hospital course: 77 y.o. 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  Patient was recently admitted to [facility] 4/2-4/23 for acute cholecystitis/septic shock and NSTEMI.  He was initially in the ICU requiring vasopressor support.  General surgery had decided against surgical intervention due to patient's history of aortic stenosis.  He was treated with antibiotics and underwent cholecystectomy tube placement by IR.  Ultimately discharged to SNF with cholecystectomy tube in place.  He was requiring supplemental oxygen at the time of discharge.  Per review of discharge summary, had biopsy of stomach/fundus mass done at [facility] and reportedly results inconclusive per pathologist who recommended deeper biopsy samples in the future.  Patient [name] of her syncopal episode.  Upon initial examination patient was found to be hypoxic hemoglobin was 7.4 and melena on rectal examination.  Chest x-ray also showed worsening bilateral airspace disease due to atelectasis or pneumonia. 4/30: Patient's mental status is improved, blood pressure has stabilized with midodrine and anemia/hemoglobin is stable.  Assessment and Plan: Syncope Altered mental status -Patient still continues to be very somnolent. - CT of the head is negative for any acute abnormalities - TSH within normal limits, ammonia is 37. - EEG ordered and shows no epileptiform activity and signs of generalized encephalopathy. -ABG  shows mild hypercapnia.Will order BIPAP but patient did not want the BiPAP. - Mentation currently much improved, pt conversing appropriately     Acute GI bleed, likely upper Acute on chronic anemia secondary to blood loss Hemoglobin currently 7.4, was ranging between 7.9-10.3 on labs done last month.  He had melena on rectal exam done in the ED and FOBT positive.  ED physician has consulted [facility].  EGD done last month showing normal esophagus, hiatal hernia, single submucosal papule (nodule) found in the stomach, and 2 duodenal polyps.  Keep n.p.o., type and screen, monitor H&H, hold Eliquis, and start IV Protonix 40 mg every 12 hours. -Hemoglobin this morning is 7.1 ordered transfusion of 1 unit of packed red blood cell. Today  HB is 8.0 -Gastroenterologist following. Capsule study demonstrating tiny gatric AVM vs mild localized gastritis vs healing from previous biopsy site without bleeding -no plan for repeat endo while inpt. Recs for outpt EUS and convert protonix to 40mg  po bid   Hypotension: - Patient has been started on midodrine monitor blood pressure after the initial dose. - Patient has chronic hypotension, blood pressure will improved on midodrine.   ?Pneumonia Chest x-ray showing worsening bilateral lower lung airspace disease which may be due to atelectasis or pneumonia, possible small left effusion, and mild cardiomegaly.  No fever or leukocytosis.  Last echo done in December 2023 showing EF 60 to 65%, however, he appears volume overloaded on exam with 3+ bilateral lower extremity edema.   -Since however procalcitonin elevated started the patient on IV cefepime and vancomycin. -Patient is currently on 1.5 L oxygen via nasal cannula, respiratory status appears to be stable. -Wean O2 as tolerated   Paroxysmal A-fib/flutter Hold Eliquis at this time. .   ESRD on  HD MWF He does appear volume overloaded but satting well on 3 L nasal cannula.  Potassium and bicarb normal.    -Nephrology following   QT prolongation Cardiac monitoring.  Continue to correct electrolytes   Type 2 diabetes A1c 6.5 in December 2023.  Sensitive sliding scale insulin every 4 hours.  Blood sugars fairly well-controlled.         Subjective: Reports feeling better today. Eager to go home soon  Physical Exam: Vitals:   11/11/22 1238 11/11/22 1257 11/11/22 1607 11/11/22 1717  BP: 121/67 111/73 (!) 104/43 114/69  Pulse: (!) 109 (!) 112  (!) 111  Resp:  18  18  Temp:  (!) 97.4 F (36.3 C) 97.9 F (36.6 C) 98.1 F (36.7 C)  TempSrc:  Oral Oral Oral  SpO2: 100% 98%  93%  Weight:       General exam: Awake, laying in bed, in nad Respiratory system: Normal respiratory effort, no wheezing Cardiovascular system: regular rate, s1, s2 Gastrointestinal system: Soft, nondistended, positive BS Central nervous system: CN2-12 grossly intact, strength intact Extremities: Perfused, no clubbing Skin: Normal skin turgor, no notable skin lesions seen Psychiatry: Mood normal // no visual hallucinations   Data Reviewed:  Labs reviewed: Na 139, K 3.9, Cr 7.2, Hgb 8.6  Family Communication: Pt in room, family not at bedside  Disposition: Status is: Inpatient Remains inpatient appropriate because: Severity of illness  Planned Discharge Destination: Skilled nursing facility    Author: Rickey Barbara, MD 11/11/2022 6:20 PM  For on call review [email or web address].

## 2022-11-11 NOTE — Progress Notes (Signed)
POST HD TX NOTE  11/11/22 1205  Vitals  Temp (!) 97.3 F (36.3 C)  Temp Source Oral  BP 115/80  MAP (mmHg) 120  BP Location Right Wrist  BP Method Automatic  Patient Position (if appropriate) Lying  Pulse Rate (!) 110  Pulse Rate Source Monitor  ECG Heart Rate (!) 110  Resp 16  Oxygen Therapy  SpO2 100 %  O2 Device Nasal Cannula  O2 Flow Rate (L/min) 3 L/min  Pulse Oximetry Type Continuous  Oximetry Probe Site Changed No  During Treatment Monitoring  Intra-Hemodialysis Comments (S)   (post HD tx VS check)  Post Treatment  Dialyzer Clearance Lightly streaked  Duration of HD Treatment -hour(s) 3.5 hour(s)  Hemodialysis Intake (mL) 0 mL  Liters Processed 84  Fluid Removed (mL) 3.5 mL  Tolerated HD Treatment Yes  Post-Hemodialysis Comments (S)  tx completed w/o problem, UF goal met, blood rinsed back, VSS. Medication Admin Ferric Gluconate 62.5mg  IVBP, Vancomycin 1000mg  IVPB  AVG/AVF Arterial Site Held (minutes) 10 minutes  AVG/AVF Venous Site Held (minutes) 7 minutes  Fistula / Graft Left Upper arm Arteriovenous fistula  No Placement Date or Time found.   Placed prior to admission: Yes  Orientation: Left  Access Location: Upper arm  Access Type: Arteriovenous fistula  Site Condition No complications  Fistula / Graft Assessment Bruit;Thrill;Present  Drainage Description None

## 2022-11-11 NOTE — Procedures (Signed)
Video Capsule Endoscopy:  VCE completed and images reviewed.  Please see media tab for full VCE report and images.  Procedure Information and Findings: 1) complete capsule study with adequate prep 2) possible tiny gastric AVM vs mild localized gastritis vs healing from previous gastric biopsy site.  No bleeding 3) Otherwise negative study without any blood noted throughout the small bowel.  Summary: Complete capsule study without any active bleeding.  Recommendations: - No plan for repeat endoscopy as inpatient - Continue serial CBC checks with blood products as needed per protocol - He was being scheduled for outpatient EUS for evaluation and biopsy of the gastric submucosal lesion noted on previous EGD x 2, along with outpatient colonoscopy with EMR of a cecal polyp.  In light of his multiple comorbidities and recent acute issues, may need to reconsider the role/utility of invasive outpatient testing, particular the colonoscopy with EMR.  He will plan to discuss this further with his primary Gastroenterologist (Dr. Leonides Schanz) as an outpatient - I discussed these results with the patient today at bedside - Convert Protonix to 40 mg po bid - Inpatient GI service will sign off at this time.  Please do not hesitate to contact us with additional questions or concerns

## 2022-11-11 NOTE — Progress Notes (Addendum)
Attending physician's note   I have taken a history, reviewed the chart, and examined the patient. I performed a substantive portion of this encounter, including complete performance of at least one of the key components, in conjunction with the APP. I agree with the APP's note, impression, and recommendations with my edits.   VCE images reviewed.  Please see separate dictation for VCE report.  Aside from a focal area of possible tiny nonbleeding AVM  vs focal gastritis  vs healing gastric biopsy site, this was a normal study.  No plan for repeat endoscopic evaluation as an inpatient. - Can transition PPI to p.o. - Serial CBC checks while inpatient - If discharging, plan for repeat CBC check in 7-10 days to ensure returning to baseline - Will arrange for outpatient GI follow-up - Inpatient GI service will sign off at this time.  Please do not hesitate to contact us with additional questions or concerns  Bobette Leyh, DO, FACG [phone] office          Progress Note  Primary GI: Unassigned  LOS: 2 days   Chief Complaint:GI bleed   Subjective  Patient states he is doing well. Wondering when he can go home. Denies melena, nausea, vomiting, abdominal pain  No family was present at the time of my evaluation.    Objective   Vital signs in last 24 hours: Temp:  [97.5 F (36.4 C)-98.7 F (37.1 C)] 97.5 F (36.4 C) (05/01 0736) Pulse Rate:  [101-110] 110 (05/01 1100) Resp:  [14-18] 15 (05/01 1100) BP: (92-214)/(28-174) 126/70 (05/01 1100) SpO2:  [91 %-100 %] 100 % (05/01 0930) Weight:  [105.2 kg] 105.2 kg (05/01 0736) Last BM Date : 11/10/22 Last BM recorded by nurses in past 5 days No data recorded  General:   male in no acute distress  Heart:  Regular rate and rhythm; no murmurs Pulm: Clear anteriorly; no wheezing Abdomen: soft, nondistended, normal bowel sounds in all quadrants. Nontender without guarding. No organomegaly appreciated. Extremities:  No  edema Neurologic:  Alert and  oriented x4;  No focal deficits.  Psych:  Cooperative. Normal mood and affect.  Intake/Output from previous day: 04/30 0701 - 05/01 0700 In: 100 [IV Piggyback:100] Out: 500  Intake/Output this shift: No intake/output data recorded.  Studies/Results: EEG adult  Result Date: 11/09/2022 Charlsie Quest, MD     11/09/2022  2:02 PM Patient Name: MARTINO TOMPSON MRN: [id number] Epilepsy Attending: Charlsie Quest Referring Physician/Provider: John Giovanni, MD Date: 11/09/2022 Duration: 25.03 mins Patient history: 77yo M with ams and syncope. Getting eeg to evaluate for seizure. Level of alertness: Awake, asleep AEDs during EEG study: None Technical aspects: This EEG study was done with scalp electrodes positioned according to the 10-20 International system of electrode placement. Electrical activity was reviewed with band pass filter of 1-70Hz , sensitivity of 7 uV/mm, display speed of 37mm/sec with a 60Hz  notched filter applied as appropriate. EEG data were recorded continuously and digitally stored.  Video monitoring was available and reviewed as appropriate. Description: The posterior dominant rhythm consists of 7.5 Hz activity of moderate voltage (25-35 uV) seen predominantly in posterior head regions, symmetric and reactive to eye opening and eye closing. Sleep was characterized by vertex waves, sleep spindles (12 to 14 Hz), maximal frontocentral region. EEG showed intermittent generalized 3-6hz  theta-delta slowing. Physiologic photic driving was not seen during photic stimulation. Hyperventilation was not performed.   ABNORMALITY - Intermittent slow, generalized IMPRESSION: This study is suggestive of  mild diffuse encephalopathy. No seizures or epileptiform discharges were seen throughout the recording. Charlsie Quest    Lab Results: Recent Labs    11/09/22 1752 11/10/22 0127 11/11/22 0214  WBC 8.7 8.8 8.1  HGB 8.1* 8.0* 8.6*  HCT 25.2* 24.2* 26.3*  PLT  174 159 174   BMET Recent Labs    11/09/22 0355 11/10/22 0127 11/11/22 0214  NA 138 135 139  K 4.3 3.5 3.9  CL 98 99 99  CO2 24 27 26   GLUCOSE 113* 97 78  BUN 41* 28* 33*  CREATININE 8.49* 6.00* 7.20*  CALCIUM 8.9 8.5* 8.7*   LFT Recent Labs    11/11/22 0214  PROT 7.1  ALBUMIN 2.2*  AST 22  ALT 27  ALKPHOS 72  BILITOT 1.1   PT/INR No results for input(s): "LABPROT", "INR" in the last 72 hours.   Scheduled Meds:  calcitRIOL  0.75 mcg Oral Q M,W,F-HD   Chlorhexidine Gluconate Cloth  6 each Topical Q0600   cinacalcet  30 mg Oral Q supper   insulin aspart  0-9 Units Subcutaneous Q4H   midodrine  10 mg Oral TID WC   pantoprazole (PROTONIX) IV  40 mg Intravenous Q12H   sucroferric oxyhydroxide  500 mg Oral TID WC   Continuous Infusions:  ceFEPime (MAXIPIME) IV 1 g (11/10/22 2254)   ferric gluconate (FERRLECIT) IVPB 62.5 mg (11/11/22 0900)   vancomycin 1,000 mg (11/11/22 1031)    Patient profile   77 year old male with complex medical history as outlined below, to include recent hospital admissions for rectal abscess/septic shock in 06/2022, LGIB x 2 in February and March (resected adenomas, treated rectal AVMs/radiation proctitis with APC, post polypectomy bleed with subsequent clipping), and most recently admission to [facility] earlier this month for acute cholecystitis requiring PERC drain in the setting of NSTEMI.  Repeat EGD at that time with no active bleeding.  He was being scheduled for procedures later this week, to include EUS for further evaluation of the gastric submucosal nodule noted on EGD in March, along with colonoscopy with EMR of known cecal polyp.   now readmitted with syncope, AMS, and acute on chronic anemia with heme positive stool.    Impression:   Acute on chronic anemia, melena -Hgb 8.6, stable (baseline 7.9-10.3) - Underwent EGD 4/1 that revealed no abnormalities apart from submucosal gastric mass.  Biopsies were done.  Biopsies  showed inconclusive result per pathologist who recommended deeper biopsy samples in the future - Capsule endo yesterday pending   Syncope(?) AMS -Facility staff perform CPR.  When EMS arrived patient was awake and had a pulse. Patient did not have oxygen on when EMS arrived and sats were low 70s. - no hypotension or hypoglycemia in ED - CT head showed chronic microvascular ischemic changes, otherwise unrevealing - Ammonia 37   Paroxysmal A-fib - holding Eliquis   ESRD on HD   QT prolongation DM2    Plan:   - await results of capsule endo - Continue daily CBC and transfuse as needed to maintain HGB > 7  - Protonix 40mg  IV BID  Bayley M McMichael  11/11/2022, 11:24 AM

## 2022-11-12 ENCOUNTER — Encounter (HOSPITAL_COMMUNITY): Payer: Self-pay

## 2022-11-12 ENCOUNTER — Encounter (HOSPITAL_COMMUNITY): Payer: Self-pay | Admitting: Gastroenterology

## 2022-11-12 ENCOUNTER — Ambulatory Visit (HOSPITAL_COMMUNITY): Admit: 2022-11-12 | Payer: Medicare Other | Admitting: Gastroenterology

## 2022-11-12 DIAGNOSIS — J810 Acute pulmonary edema: Secondary | ICD-10-CM | POA: Diagnosis not present

## 2022-11-12 DIAGNOSIS — R55 Syncope and collapse: Secondary | ICD-10-CM | POA: Diagnosis not present

## 2022-11-12 LAB — COMPREHENSIVE METABOLIC PANEL
ALT: 25 U/L (ref 0–44)
AST: 25 U/L (ref 15–41)
Albumin: 1.9 g/dL — ABNORMAL LOW (ref 3.5–5.0)
Alkaline Phosphatase: 65 U/L (ref 38–126)
Anion gap: 9 (ref 5–15)
BUN: 23 mg/dL (ref 8–23)
CO2: 27 mmol/L (ref 22–32)
Calcium: 8 mg/dL — ABNORMAL LOW (ref 8.9–10.3)
Chloride: 97 mmol/L — ABNORMAL LOW (ref 98–111)
Creatinine, Ser: 5.13 mg/dL — ABNORMAL HIGH (ref 0.61–1.24)
GFR, Estimated: 11 mL/min — ABNORMAL LOW (ref >60)
Glucose, Bld: 99 mg/dL (ref 70–99)
Potassium: 3.6 mmol/L (ref 3.5–5.1)
Sodium: 133 mmol/L — ABNORMAL LOW (ref 135–145)
Total Bilirubin: 1 mg/dL (ref 0.3–1.2)
Total Protein: 6.3 g/dL — ABNORMAL LOW (ref 6.5–8.1)

## 2022-11-12 LAB — CBC
HCT: 25 % — ABNORMAL LOW (ref 39.0–52.0)
Hemoglobin: 8 g/dL — ABNORMAL LOW (ref 13.0–17.0)
MCH: 33.2 pg (ref 26.0–34.0)
MCHC: 32 g/dL (ref 30.0–36.0)
MCV: 103.7 fL — ABNORMAL HIGH (ref 80.0–100.0)
Platelets: 189 10*3/uL (ref 150–400)
RBC: 2.41 MIL/uL — ABNORMAL LOW (ref 4.22–5.81)
RDW: 18.8 % — ABNORMAL HIGH (ref 11.5–15.5)
WBC: 8.3 10*3/uL (ref 4.0–10.5)
nRBC: 0 % (ref 0.0–0.2)

## 2022-11-12 LAB — GLUCOSE, CAPILLARY
Glucose-Capillary: 103 mg/dL — ABNORMAL HIGH (ref 70–99)
Glucose-Capillary: 107 mg/dL — ABNORMAL HIGH (ref 70–99)
Glucose-Capillary: 109 mg/dL — ABNORMAL HIGH (ref 70–99)
Glucose-Capillary: 131 mg/dL — ABNORMAL HIGH (ref 70–99)
Glucose-Capillary: 77 mg/dL (ref 70–99)
Glucose-Capillary: 78 mg/dL (ref 70–99)

## 2022-11-12 SURGERY — COLONOSCOPY WITH PROPOFOL
Anesthesia: Monitor Anesthesia Care

## 2022-11-12 MED ORDER — PANTOPRAZOLE SODIUM 40 MG PO TBEC
40.0000 mg | DELAYED_RELEASE_TABLET | Freq: Two times a day (BID) | ORAL | Status: DC
  Administered 2022-11-12 – 2022-11-24 (×22): 40 mg via ORAL
  Filled 2022-11-12 (×22): qty 1

## 2022-11-12 MED ORDER — CHLORHEXIDINE GLUCONATE CLOTH 2 % EX PADS
6.0000 | MEDICATED_PAD | Freq: Every day | CUTANEOUS | Status: DC
  Administered 2022-11-14 – 2022-11-19 (×5): 6 via TOPICAL

## 2022-11-12 MED ORDER — PREGABALIN 25 MG PO CAPS
75.0000 mg | ORAL_CAPSULE | Freq: Every day | ORAL | Status: DC
  Administered 2022-11-12 – 2022-11-23 (×11): 75 mg via ORAL
  Filled 2022-11-12 (×12): qty 3

## 2022-11-12 NOTE — Care Management Important Message (Signed)
Important Message  Patient Details  Name: Justin [name] MRN: [id number] Date of Birth: [birth year]   Medicare Important Message Given:  Yes     Renie Ora 11/12/2022, 9:49 AM

## 2022-11-12 NOTE — Progress Notes (Addendum)
Pharmacy Antibiotic Note  Justin [name] is a 77 y.o. male admitted on 11/08/2022 with pneumonia.  Pharmacy has been consulted for vancomycin dosing. He is afebrile, WBC are normal, and he is on HD MWF. CXR shows possible PNA.   Plan for 5d course. Noted 5/1 vancomycin charted as given during HD session. Will not order levels given short course.   Plan: Vancomycin 1000 mg after HD on MWF Cefepime 1 g IV q24h Monitor for HD schedule changes, clinical progress, cultures/sensitivities End dates entered     Weight: 99.8 kg (220 lb 0.3 oz)  Temp (24hrs), Avg:97.9 F (36.6 C), Min:97.3 F (36.3 C), Max:98.5 F (36.9 C)  Recent Labs  Lab 11/08/22 2020 11/09/22 0355 11/09/22 1752 11/10/22 0127 11/11/22 0214 11/12/22 0142  WBC  --  9.3 8.7 8.8 8.1 8.3  CREATININE 8.41* 8.49*  --  6.00* 7.20* 5.13*     Estimated Creatinine Clearance: 14 mL/min (A) (by C-G formula based on SCr of 5.13 mg/dL (H)).    No Known Allergies  Antimicrobials: Cefepime 4/29 >> 5/4 Vanc 4/30 >> 5/5  Microbiology  4/27 covid neg   Thank you for involving pharmacy in this patient's care.  Alphia Moh, PharmD, BCPS, BCCP Clinical Pharmacist  Please check AMION for all [facility] phone numbers After 10:00 PM, call Main Pharmacy [phone]

## 2022-11-12 NOTE — Progress Notes (Signed)
Progress Note   Patient: Justin [name] ZOX:[id number] DOB: 1945/12/11 DOA: 11/08/2022     3 DOS: the patient was seen and examined on 11/12/2022   Brief hospital course: 77 y.o. 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  Patient was recently admitted to [facility] 4/2-4/23 for acute cholecystitis/septic shock and NSTEMI.  He was initially in the ICU requiring vasopressor support.  General surgery had decided against surgical intervention due to patient's history of aortic stenosis.  He was treated with antibiotics and underwent cholecystectomy tube placement by IR.  Ultimately discharged to SNF with cholecystectomy tube in place.  He was requiring supplemental oxygen at the time of discharge.  Per review of discharge summary, had biopsy of stomach/fundus mass done at [facility] and reportedly results inconclusive per pathologist who recommended deeper biopsy samples in the future.  Patient Justin [name] of her syncopal episode.  Upon initial examination patient was found to be hypoxic hemoglobin was 7.4 and melena on rectal examination.  Chest x-ray also showed worsening bilateral airspace disease due to atelectasis or pneumonia. 4/30: Patient's mental status is improved, blood pressure has stabilized with midodrine and anemia/hemoglobin is stable.  Assessment and Plan: Syncope Altered mental status -Patient still continues to be very somnolent. - CT of the head is negative for any acute abnormalities - TSH within normal limits, ammonia is 37. - EEG ordered and shows no epileptiform activity and signs of generalized encephalopathy. -ABG  shows mild hypercapnia.Will order BIPAP but patient did not want the BiPAP. - Mentation currently much improved, pt conversing appropriately     Acute GI bleed, likely upper Acute on chronic anemia secondary to blood loss Hemoglobin currently 7.4, was ranging between 7.9-10.3 on labs done last month.  He had melena on rectal exam done in the ED and FOBT positive.  ED physician has consulted [facility].  EGD done last month showing normal esophagus, hiatal hernia, single submucosal papule (nodule) found in the stomach, and 2 duodenal polyps.   -received 1 unit PRBC transfusion this visit -Gastroenterologist following. Capsule study demonstrating tiny gatric AVM vs mild localized gastritis vs healing from previous biopsy site without bleeding -no plan for repeat endo while inpt. Recs for outpt EUS and convert protonix to 40mg  po bid -discussed with GI. OK to resume eliquis at this time   Hypotension: - Patient has been started on midodrine monitor blood pressure after the initial dose. - Patient has chronic hypotension, blood pressure will improved on midodrine.   Pneumonia ruled in Chest x-ray showing worsening bilateral lower lung airspace disease which may be due to atelectasis or pneumonia, possible small left effusion, and mild cardiomegaly.  No fever or leukocytosis.  Last echo done in December 2023 showing EF 60 to 65%, however, he appears volume overloaded on exam with 3+ bilateral lower extremity edema.   -Since however procalcitonin elevated started the patient on IV cefepime and vancomycin. -Patient is currently on 1.5 L oxygen via nasal cannula, respiratory status appears to be stable. -Attempt to wean O2, however pt observed to desaturate to the mid-80's while asleep on RA. O2 sats improved when awake and pt noted to be alert and oriented -Suspect pt would benefit from nocutrnal O2 -  Also recommend repeat sleep study as outpatient   Paroxysmal A-fib/flutter OK to resume eliquis per  GI at this time. Discussed with Gastroenterology   ESRD on HD MWF He does appear volume overloaded but satting well on 3 L nasal cannula.  Potassium and bicarb normal.   -Nephrology following   QT prolongation Cardiac monitoring.  Continue to correct electrolytes   Type 2 diabetes A1c 6.5 in December 2023.  Sensitive sliding scale insulin every 4 hours.  Blood sugars fairly well-controlled.         Subjective: Without complaints. Observed to desatureate to the mid-80's while asleep, requiring 2LNC  Physical Exam: Vitals:   11/12/22 0730 11/12/22 1130 11/12/22 1220 11/12/22 1455  BP: 118/61 116/64    Pulse: (!) 107 (!) 105    Resp: 19 17    Temp: 98.1 F (36.7 C) 97.6 F (36.4 C)    TempSrc: Oral Oral    SpO2: 100% 94% 95% 91%  Weight:       General exam: Conversant, in no acute distress Respiratory system: normal chest rise, clear, no audible wheezing Cardiovascular system: regular rhythm, s1-s2 Gastrointestinal system: Nondistended, nontender, pos BS Central nervous system: No seizures, no tremors Extremities: No cyanosis, no joint deformities Skin: No rashes, no pallor Psychiatry: Affect normal // no auditory hallucinations   Data Reviewed:  Labs reviewed: Na 133, K 3.6, Cr 5.13, Hgb 8.0  Family Communication: Pt in room, family not at bedside  Disposition: Status is: Inpatient Remains inpatient appropriate because: Severity of illness  Planned Discharge Destination: Skilled nursing facility    Author: Rickey Barbara, MD 11/12/2022 3:14 PM  For on call review [email or web address].

## 2022-11-12 NOTE — Progress Notes (Signed)
Patient resting in bed and had 10 beats of v-tach then back to S.T. . I was in room when it happened and patient was asymptomatic

## 2022-11-12 NOTE — Progress Notes (Signed)
[facility] Progress Note  Subjective: seen in room. No c/o's.   Vitals:   11/12/22 0730 11/12/22 1130 11/12/22 1220 11/12/22 1455  BP: 118/61 116/64    Pulse: (!) 107 (!) 105    Resp: 19 17    Temp: 98.1 F (36.7 C) 97.6 F (36.4 C)    TempSrc: Oral Oral    SpO2: 100% 94% 95% 91%  Weight:        Exam: Gen no distress, alert and interacting today No rash, cyanosis or gangrene Sclera anicteric, throat clear  No jvd or bruits Chest clear bilat to bases, no rales/ wheezing RRR no MRG Abd soft ntnd no mass or ascites +bs Ext bilat 1+ LE edema, 1+ left UE edema, improving Neuro is as above    LUA AVF+bruit        Home meds include - asa, eliquis, midodrine 10mg  pre hd mwf or tid, prilosec, protonix, lyrica, velphoro 500mg  ac tid, zyloprim, amiodarone, lipitor, sensipar 30 qd, norco prn, lokelma 10gm biw, lopressor 12.5 bid, prns/ vits/ supps      [facility] HD: [facility] 3h  450/600  99kg  2/2.5 bath  LUA AVF  Hep none - last HD 4/26, post wt 111kg, coming off 10+kg over last 2 sessions - rocaltrol 0.75 mcg po tiw - venofer 100mg  tiw thru 5/08 - mircera 200 mcg IV q 2 wks, last 4/26 - last Hb 7.7 on 4/24     Assessment/ Plan: LOC episode/ AMS - had brief CPR x 4 min at SNF before EMS arrived. Was somnolent here x 24 hrs then improved the next day. ABG showed pCO2 of 57. Not sure cause of AMS but has resolved. Awaiting dc to SNF.  PNA - getting IV abx here for suspected PNA. D#5 today.  SP cholecystotomy - during recent admit at [facility].  Anemia - Hb not far from recent outpatient labs lately (7-8s). Per pmd.  Acute hypoxic resp failure/ vol overload - pt admitted 10kg over dry wt w/ diffuse LE edema. After HD x 2 is now up 1-2kg, vol overload resolved.  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 Had HD x 2 here. Next HD tomorrow.  HTN - not taking any HTN meds at home or here.  Anemia esrd - Hb here in 7s, close to OP recent labs. Continuing the outpt IV iron load while here. Not due  for esa for a while. Follow.  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 Cont po vdra, sensipar qd and velphoro as binder.  Dispo - per pmd, looks like pending dc to SNF    Vinson Moselle MD [facility] 11/12/2022, 4:15 PM  Recent Labs  Lab 11/09/22 1752 11/10/22 0127 11/11/22 0214 11/12/22 0142  HGB 8.1*   < > 8.6* 8.0*  ALBUMIN  --    < > 2.2* 1.9*  CALCIUM  --    < > 8.7* 8.0*  PHOS 7.2*  --   --   --   CREATININE  --    < > 7.20* 5.13*  K  --    < > 3.9 3.6   < > = values in this interval not displayed.    No results for input(s): "IRON", "TIBC", "FERRITIN" in the last 168 hours. Inpatient medications:  calcitRIOL  0.75 mcg Oral Q M,W,F-HD   Chlorhexidine Gluconate Cloth  6 each Topical Q0600   cinacalcet  30 mg Oral Q supper   insulin aspart  0-9 Units Subcutaneous Q4H  midodrine  10 mg Oral TID WC   pantoprazole  40 mg Oral BID   pregabalin  75 mg Oral QHS   sucroferric oxyhydroxide  500 mg Oral TID WC    ceFEPime (MAXIPIME) IV Stopped (11/11/22 2103)   ferric gluconate (FERRLECIT) IVPB Stopped (11/11/22 1236)   vancomycin Stopped (11/11/22 1237)   acetaminophen **OR** acetaminophen

## 2022-11-12 NOTE — Progress Notes (Signed)
At rest o2 86% sleeping, patient non- ambulatory.  At rest sleeping on 2l-o2, 94%, patient non-ambulatory.

## 2022-11-13 DIAGNOSIS — R55 Syncope and collapse: Secondary | ICD-10-CM | POA: Diagnosis not present

## 2022-11-13 DIAGNOSIS — D649 Anemia, unspecified: Secondary | ICD-10-CM | POA: Diagnosis not present

## 2022-11-13 DIAGNOSIS — J810 Acute pulmonary edema: Secondary | ICD-10-CM | POA: Diagnosis not present

## 2022-11-13 LAB — COMPREHENSIVE METABOLIC PANEL
ALT: 24 U/L (ref 0–44)
AST: 23 U/L (ref 15–41)
Albumin: 1.9 g/dL — ABNORMAL LOW (ref 3.5–5.0)
Alkaline Phosphatase: 72 U/L (ref 38–126)
Anion gap: 14 (ref 5–15)
BUN: 25 mg/dL — ABNORMAL HIGH (ref 8–23)
CO2: 25 mmol/L (ref 22–32)
Calcium: 8.4 mg/dL — ABNORMAL LOW (ref 8.9–10.3)
Chloride: 94 mmol/L — ABNORMAL LOW (ref 98–111)
Creatinine, Ser: 6.17 mg/dL — ABNORMAL HIGH (ref 0.61–1.24)
GFR, Estimated: 9 mL/min — ABNORMAL LOW (ref >60)
Glucose, Bld: 109 mg/dL — ABNORMAL HIGH (ref 70–99)
Potassium: 3.5 mmol/L (ref 3.5–5.1)
Sodium: 133 mmol/L — ABNORMAL LOW (ref 135–145)
Total Bilirubin: 0.8 mg/dL (ref 0.3–1.2)
Total Protein: 6.6 g/dL (ref 6.5–8.1)

## 2022-11-13 LAB — TYPE AND SCREEN
ABO/RH(D): O POS
DAT, IgG: NEGATIVE
Unit division: 0
Unit division: 0

## 2022-11-13 LAB — CBC
HCT: 24.8 % — ABNORMAL LOW (ref 39.0–52.0)
Hemoglobin: 8.1 g/dL — ABNORMAL LOW (ref 13.0–17.0)
MCH: 32.7 pg (ref 26.0–34.0)
MCHC: 32.7 g/dL (ref 30.0–36.0)
MCV: 100 fL (ref 80.0–100.0)
Platelets: 208 10*3/uL (ref 150–400)
RBC: 2.48 MIL/uL — ABNORMAL LOW (ref 4.22–5.81)
RDW: 18.6 % — ABNORMAL HIGH (ref 11.5–15.5)
WBC: 8 10*3/uL (ref 4.0–10.5)
nRBC: 0 % (ref 0.0–0.2)

## 2022-11-13 LAB — BPAM RBC
Blood Product Expiration Date: 202405292359
ISSUE DATE / TIME: 202404291229
ISSUE DATE / TIME: 202405030636
Unit Type and Rh: 5100

## 2022-11-13 LAB — GLUCOSE, CAPILLARY
Glucose-Capillary: 106 mg/dL — ABNORMAL HIGH (ref 70–99)
Glucose-Capillary: 114 mg/dL — ABNORMAL HIGH (ref 70–99)
Glucose-Capillary: 118 mg/dL — ABNORMAL HIGH (ref 70–99)
Glucose-Capillary: 77 mg/dL (ref 70–99)
Glucose-Capillary: 99 mg/dL (ref 70–99)

## 2022-11-13 MED ORDER — MIDODRINE HCL 5 MG PO TABS
10.0000 mg | ORAL_TABLET | Freq: Three times a day (TID) | ORAL | Status: DC
  Administered 2022-11-13 – 2022-11-21 (×24): 10 mg via ORAL
  Filled 2022-11-13 (×24): qty 2

## 2022-11-13 MED ORDER — APIXABAN 5 MG PO TABS
5.0000 mg | ORAL_TABLET | Freq: Two times a day (BID) | ORAL | Status: DC
  Administered 2022-11-13 – 2022-11-24 (×22): 5 mg via ORAL
  Filled 2022-11-13 (×22): qty 1

## 2022-11-13 NOTE — Progress Notes (Signed)
Patient back from HD.

## 2022-11-13 NOTE — Progress Notes (Signed)
[facility] Progress Note  Subjective: seen in dialysis, no c/o's.   Vitals:   11/13/22 1123 11/13/22 1130 11/13/22 1134 11/13/22 1200  BP: 104/77 115/66 116/72 (!) 119/59  Pulse: (!) 111 (!) 111 (!) 111 (!) 112  Resp: 16 15 16 18   Temp:    97.6 F (36.4 C)  TempSrc:    Oral  SpO2:  100% 100% 99%  Weight:   101.8 kg     Exam: Gen no distress, alert and interacting today No rash, cyanosis or gangrene Sclera anicteric, throat clear  No jvd or bruits Chest clear bilat to bases, no rales/ wheezing RRR no MRG Abd soft ntnd no mass or ascites +bs Ext bilat 1+ LE edema, 1+ left UE edema, improving Neuro is as above    LUA AVF+bruit        Home meds include - asa, eliquis, midodrine 10mg  pre hd mwf or tid, prilosec, protonix, lyrica, velphoro 500mg  ac tid, zyloprim, amiodarone, lipitor, sensipar 30 qd, norco prn, lokelma 10gm biw, lopressor 12.5 bid, prns/ vits/ supps      OP HD: [facility] M[facility] 3h  450/600  99kg  2/2.5 bath  LUA AVF  Hep none - last HD 4/26, post wt 111kg, coming off 10+kg over last 2 sessions - rocaltrol 0.75 mcg po tiw - venofer 100mg  tiw thru 5/08 - mircera 200 mcg IV q 2 wks, last 4/26 - last Hb 7.7 on 4/24     Assessment/ Plan: LOC episode/ AMS - had brief CPR x 4 min at SNF before EMS arrived. Was somnolent here x 24 hrs then improved the next day. ABG showed pCO2 of 57. Not sure cause of AMS but has resolved.  PNA - getting IV abx here for suspected PNA. D#6 today.  SP cholecystotomy - during recent admit at [facility].  Anemia - Hb not far from recent outpatient labs lately (7-8s). Per pmd.  Acute hypoxic resp failure/ vol overload - pt admitted 10kg over dry wt w/ diffuse LE edema. After HD x 2 is now up 1-2kg, vol overload resolved.  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 Getting HD here on schedule, including today.  HTN - not taking any HTN meds at home or here.  Anemia esrd - Hb here in 7s, close to OP recent labs. Continuing the outpt IV iron load while  here. Not due for esa for a while. Follow.  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 Cont po vdra, sensipar qd and velphoro as binder.  Dispo - per pmd, looks like pending dc to SNF    Vinson Moselle MD [facility] 11/13/2022, 3:37 PM  Recent Labs  Lab 11/09/22 1752 11/10/22 0127 11/12/22 0142 11/13/22 0141 11/13/22 0721  HGB 8.1*   < > 8.0*  --  8.1*  ALBUMIN  --    < > 1.9* 1.9*  --   CALCIUM  --    < > 8.0* 8.4*  --   PHOS 7.2*  --   --   --   --   CREATININE  --    < > 5.13* 6.17*  --   K  --    < > 3.6 3.5  --    < > = values in this interval not displayed.    No results for input(s): "IRON", "TIBC", "FERRITIN" in the last 168 hours. Inpatient medications:  apixaban  5 mg Oral BID   calcitRIOL  0.75 mcg Oral Q M,W,F-HD   Chlorhexidine Gluconate  Cloth  6 each Topical Q0600   Chlorhexidine Gluconate Cloth  6 each Topical Q0600   cinacalcet  30 mg Oral Q supper   insulin aspart  0-9 Units Subcutaneous Q4H   midodrine  10 mg Oral TID WC   pantoprazole  40 mg Oral BID   pregabalin  75 mg Oral QHS   sucroferric oxyhydroxide  500 mg Oral TID WC    ceFEPime (MAXIPIME) IV Stopped (11/12/22 2015)   ferric gluconate (FERRLECIT) IVPB Stopped (11/13/22 1135)   acetaminophen **OR** acetaminophen

## 2022-11-13 NOTE — Progress Notes (Deleted)
   11/13/22 2015  Provider Notification  Provider Name/Title Myra Gianotti MD  Date Provider Notified 11/13/22  Time Provider Notified 2015  Method of Notification Page  Notification Reason Other (Comment) (Patients Art Line not working properly)  Provider response Other (Comment) Hydrographic surveyor advised to remove art line)  Date of Provider Response 11/13/22  Time of Provider Response 2017

## 2022-11-13 NOTE — Evaluation (Signed)
Occupational Therapy Evaluation Patient Details Name: Justin [name] MRN: [id number] DOB: 09/20/1945 Today's Date: 11/13/2022   History of Present Illness Pt is a 77 y.o. male admitted 4/28 from SNF with syncope. Pt with recent hospitalization at [facility] in April 2024 for NSTEMI. 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 Pt would benefit from cont OT to increase independence with basic adls and adl transfers so he can attempt to be more involved with his own care. Pt has been in and out of hospital/SNF since Jan 2024 but has good potential with transfers and adls.  Pt has wounds on heels that need to be looked at/dressed. Pt educated about positioning and floating heels of of pillows for pressure relief.  Feel slide board transfers may give this pt the ability to spend more time out of bed if he cannot tolerate standing. Will continue to see with goal of OOB.      Recommendations for follow up therapy are one component of a multi-disciplinary discharge planning process, led by the attending physician.  Recommendations may be updated based on patient status, additional functional criteria and insurance authorization.   Assistance Recommended at Discharge Frequent or constant Supervision/Assistance  Patient can return home with the following Two people to help with walking and/or transfers;A lot of help with bathing/dressing/bathroom;Assistance with cooking/housework;Direct supervision/assist for medications management;Direct supervision/assist for financial management;Assist for transportation;Help with  stairs or ramp for entrance    Functional Status Assessment  Patient has had a recent decline in their functional status and demonstrates the ability to make significant improvements in function in a reasonable and predictable amount of time.  Equipment Recommendations  Other (comment) (tbd)    Recommendations for Other Services       Precautions / Restrictions Precautions Precautions: Fall;Other (comment) Precaution Comments: pressure sores bilat heels, hypersensitivity bilat feet Restrictions Weight Bearing Restrictions: No      Mobility Bed Mobility Overal bed mobility: Needs Assistance Bed Mobility: Rolling, Sidelying to Sit, Sit to Supine Rolling: Min assist Sidelying to sit: Mod assist, +2 for physical assistance   Sit to supine: Mod assist, +2 for physical assistance   General bed mobility comments: Pt follows instructions well to get to EOB and gives good effort. Pt limited by pain in legs and feet when moving.    Transfers Overall transfer level: Needs assistance                 General transfer comment: Pt unable to stand at this time due to pain in BLE due to neuropathy. Pt has not stood in months. Feel like slide board transfers are a good option for this pt moving forward.      Balance Overall balance assessment: Needs assistance Sitting-balance support: Bilateral upper extremity supported, Feet supported Sitting balance-Leahy Scale: Fair         Standing balance comment: Pt unable to stand                           ADL either performed or assessed with clinical judgement   ADL Overall ADL's : Needs assistance/impaired Eating/Feeding: Independent;Sitting   Grooming:  Wash/dry hands;Wash/dry face;Oral care;Set up;Sitting   Upper Body Bathing: Set up;Sitting   Lower Body Bathing: Maximal assistance;Bed level;Cueing for compensatory techniques   Upper Body Dressing : Minimal assistance;Sitting   Lower Body Dressing: Maximal  assistance;Bed level;Cueing for compensatory techniques Lower Body Dressing Details (indicate cue type and reason): pt unable to stand   Toilet Transfer Details (indicate cue type and reason): pt unable to stand. Only option would be lateral scoot transfer or ant/post transfers to [facility] or drop arm BSC Toileting- Clothing Manipulation and Hygiene: Total assistance;+2 for physical assistance;Bed level       Functional mobility during ADLs: Moderate assistance;+2 for physical assistance General ADL Comments: Staff has done pts self care in rehab facilities and hosptial for last few months. Pt has ability to do own adls to some degree.     Vision Baseline Vision/History: 1 Wears glasses Ability to See in Adequate Light: 0 Adequate Patient Visual Report: No change from baseline Vision Assessment?: No apparent visual deficits     Perception     Praxis      Pertinent Vitals/Pain Pain Assessment Pain Assessment: Faces Faces Pain Scale: Hurts even more Pain Location: BLES Pain Descriptors / Indicators: Aching, Grimacing, Sharp, Shooting, Sore Pain Intervention(s): Limited activity within patient's tolerance, Monitored during session, Repositioned     Hand Dominance Right   Extremity/Trunk Assessment Upper Extremity Assessment Upper Extremity Assessment: Defer to OT evaluation   Lower Extremity Assessment Lower Extremity Assessment: Generalized weakness;RLE deficits/detail;LLE deficits/detail RLE Deficits / Details: hypersensitivity bilat feet due to neuropathy, pressure sore at heel (large fluid filled blister) RLE Sensation: history of peripheral neuropathy LLE Deficits / Details: hypersensitivity bilat feet due to neuropathy, pressure sore at heel (large fluid filled blister) LLE Sensation: history of peripheral neuropathy   Cervical / Trunk Assessment Cervical / Trunk Assessment: Normal   Communication Communication Communication: No difficulties   Cognition  Arousal/Alertness: Awake/alert Behavior During Therapy: WFL for tasks assessed/performed Overall Cognitive Status: Within Functional Limits for tasks assessed                                 General Comments: general cognition appears intact.  Slightly slow to respond.     General Comments  VSS on 2L    Exercises     Shoulder Instructions      Home Living Family/patient expects to be discharged to:: Skilled nursing facility                                 Additional Comments: Pt lived at home alone prior to 06/2022 hospitalization. Since that time he has either been at [facility] or additional hospitalizations.      Prior Functioning/Environment Prior Level of Function : Needs assist       Physical Assist : Mobility (physical);ADLs (physical) Mobility (physical): Bed mobility;Transfers ADLs (physical): Bathing;Dressing;Toileting;IADLs Mobility Comments: Prior to 06/2022 hospitalization, pt lived alone mod I with SPC vs RW. Still driving. Pt has not stood on his feet in at least 3 months. ADLs Comments: Independent prior to 07/2022.  Now, pt is dependent wtih LE adls and min assist with UE adls.        OT Problem List: Decreased activity tolerance;Impaired balance (sitting and/or standing);Decreased knowledge of use of DME or AE;Decreased knowledge of precautions;Obesity;Pain;Increased edema      OT Treatment/Interventions: Self-care/ADL training;Therapeutic activities;DME and/or AE instruction  OT Goals(Current goals can be found in the care plan section) Acute Rehab OT Goals Patient Stated Goal: to be more independent OT Goal Formulation: With patient Time For Goal Achievement: 11/27/22 Potential to Achieve Goals: Good ADL Goals Pt Will Perform Grooming: with supervision;sitting Pt Will Perform Lower Body Bathing: with min assist;with adaptive equipment;sitting/lateral leans;sit to/from stand Pt Will Perform Lower Body Dressing: with mod  assist;with adaptive equipment;sitting/lateral leans;sit to/from stand Pt Will Transfer to Toilet: bedside commode;with mod assist;anterior/posterior transfer  OT Frequency: Min 1X/week    Co-evaluation PT/OT/SLP Co-Evaluation/Treatment: Yes Reason for Co-Treatment: For patient/therapist safety;To address functional/ADL transfers PT goals addressed during session: Mobility/safety with mobility OT goals addressed during session: ADL's and self-care      AM-PAC OT "6 Clicks" Daily Activity     Outcome Measure Help from another person eating meals?: None Help from another person taking care of personal grooming?: A Little Help from another person toileting, which includes using toliet, bedpan, or urinal?: A Lot Help from another person bathing (including washing, rinsing, drying)?: A Lot Help from another person to put on and taking off regular upper body clothing?: A Lot Help from another person to put on and taking off regular lower body clothing?: A Lot 6 Click Score: 15   End of Session Nurse Communication: Mobility status;Other (comment) (need to look at wounds on pts heel)  Activity Tolerance: No increased pain Patient left: in bed;with call bell/phone within reach  OT Visit Diagnosis: Unsteadiness on feet (R26.81);Muscle weakness (generalized) (M62.81);Pain Pain - Right/Left:  (both) Pain - part of body:  (feet)                Time: 5621-3086 OT Time Calculation (min): 17 min Charges:  OT General Charges $OT Visit: 1 Visit OT Evaluation $OT Eval Moderate Complexity: 1 Mod  Hope Budds 11/13/2022, 12:47 PM

## 2022-11-13 NOTE — Progress Notes (Signed)
Pt receives out-pt HD at [facility] on MWF. Will assist as needed.   Jas Betten Renal Navigator [phone] 

## 2022-11-13 NOTE — Progress Notes (Signed)
   11/13/22 1134  Vitals  Pulse Rate (!) 111  Resp 16  BP 116/72  SpO2 100 %  O2 Device Nasal Cannula  Weight 101.8 kg  Type of Weight Post-Dialysis  Oxygen Therapy  O2 Flow Rate (L/min) 2 L/min  Pulse Oximetry Type Continuous  Oximetry Probe Site Changed No  Post Treatment  Dialyzer Clearance Lightly streaked  Duration of HD Treatment -hour(s) 3.75 hour(s)  Hemodialysis Intake (mL) 0 mL  Liters Processed 68.5  Fluid Removed (mL) 1000 mL  Tolerated HD Treatment Yes  Post-Hemodialysis Comments see notes  AVG/AVF Arterial Site Held (minutes) 10 minutes  AVG/AVF Venous Site Held (minutes) 10 minutes   Received patient in bed to unit.  Alert and oriented.  Informed consent signed and in chart.   TX duration:3.75  Patient tolerated well.  Transported back to the room  Alert, without acute distress.  Hand-off given to patient's nurse.   Access used: LUAF Access issues: no complications  Total UF removed: 1000 Medication(s) given: ferrilicit ov   [facility]

## 2022-11-13 NOTE — Progress Notes (Signed)
PT Cancellation Note  Patient Details Name: Justin [name] MRN: [id number] DOB: 07/21/1945   Cancelled Treatment:    Reason Eval/Treat Not Completed: Patient at procedure or test/unavailable. Pt in HD. PT to re-attempt as time allows.   Ilda Foil 11/13/2022, 11:32 AM

## 2022-11-13 NOTE — Progress Notes (Signed)
ANTICOAGULATION CONSULT NOTE - Initial Consult  Pharmacy Consult for apixaban Indication: atrial fibrillation  No Known Allergies  Patient Measurements: Weight: 101.8 kg (224 lb 6.9 oz)   Vital Signs: Temp: 97.6 F (36.4 C) (05/03 1200) Temp Source: Oral (05/03 1200) BP: 119/59 (05/03 1200) Pulse Rate: 112 (05/03 1200)  Labs: Recent Labs    11/11/22 0214 11/12/22 0142 11/13/22 0141 11/13/22 0721  HGB 8.6* 8.0*  --  8.1*  HCT 26.3* 25.0*  --  24.8*  PLT 174 189  --  208  CREATININE 7.20* 5.13* 6.17*  --     Estimated Creatinine Clearance: 11.8 mL/min (A) (by C-G formula based on SCr of 6.17 mg/dL (H)).   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 Video capsule di not find any active bleeding. Gastroenterology ok with restarting apixaban.  Goal of Therapy:   Monitor platelets by anticoagulation protocol: Yes   Plan:  Resume home apixaban 5mg  BID Monitor for signs/symptoms of bleeding   Alphia Moh, PharmD, BCPS, BCCP Clinical Pharmacist  Please check [facility]ION for all [facility] Pharm[facility] phone numbers After 10:00 PM, call Main Pharmacy [phone]

## 2022-11-13 NOTE — Progress Notes (Signed)
Progress Note   Patient: Justin [name]:[id number] DOB: 03-09-1946 DOA: 11/08/2022     4 DOS: the patient was seen and examined on 11/13/2022   Brief hospital course: 77 y.o. 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  Patient was recently admitted to [facility] 4/2-4/23 for acute cholecystitis/septic shock and NSTEMI.  He was initially in the ICU requiring vasopressor support.  General surgery had decided against surgical intervention due to patient's history of aortic stenosis.  He was treated with antibiotics and underwent cholecystectomy tube placement by IR.  Ultimately discharged to SNF with cholecystectomy tube in place.  He was requiring supplemental oxygen at the time of discharge.  Per review of discharge summary, had biopsy of stomach/fundus mass done at [facility] and reportedly results inconclusive per pathologist who recommended deeper biopsy samples in the future.  Patient Justin [name] of her syncopal episode.  Upon initial examination patient was found to be hypoxic hemoglobin was 7.4 and melena on rectal examination.  Chest x-ray also showed worsening bilateral airspace disease due to atelectasis or pneumonia. 4/30: Patient's mental status is improved, blood pressure has stabilized with midodrine and anemia/hemoglobin is stable.  Assessment and Plan: Syncope Altered mental status -Patient still continues to be very somnolent. - CT of the head is negative for any acute abnormalities - TSH within normal limits, ammonia is 37. - EEG ordered and shows no epileptiform activity and signs of generalized encephalopathy. -ABG  shows mild hypercapnia.Will order BIPAP but patient did not want the BiPAP. - Mentation currently much improved, pt conversing appropriately     Acute GI bleed, likely upper Acute on chronic anemia secondary to blood loss Hemoglobin currently 7.4, was ranging between 7.9-10.3 on labs done last month.  He had melena on rectal exam done in the ED and FOBT positive.  ED physician has consulted [facility].  EGD done last month showing normal esophagus, hiatal hernia, single submucosal papule (nodule) found in the stomach, and 2 duodenal polyps.   -received 1 unit PRBC transfusion this visit -Gastroenterologist following. Capsule study demonstrating tiny gatric AVM vs mild localized gastritis vs healing from previous biopsy site without bleeding -no plan for repeat endo while inpt. Recs for outpt EUS and convert protonix to 40mg  po bid -discussed with GI. OK to resume eliquis at this time   Hypotension: - Patient has been started on midodrine monitor blood pressure after the initial dose. - Patient has chronic hypotension, blood pressure will improved on midodrine.   Pneumonia ruled in Chest x-ray showing worsening bilateral lower lung airspace disease which may be due to atelectasis or pneumonia, possible small left effusion, and mild cardiomegaly.  No fever or leukocytosis.  Last echo done in December 2023 showing EF 60 to 65%, however, he appears volume overloaded on exam with 3+ bilateral lower extremity edema.   -Since however procalcitonin elevated started the patient on IV cefepime and vancomycin. -Patient is currently on 1.5 L oxygen via nasal cannula, respiratory status appears to be stable. -Attempt to wean O2, however pt observed to desaturate to the mid-80's while asleep on RA. O2 sats improved when awake and pt noted to be alert and oriented -Suspect pt would benefit from nocutrnal O2 -  recommend sleep study as outpatient   Paroxysmal A-fib/flutter OK to resume eliquis per GI at this  time. Discussed with Gastroenterology -resumed eliquis   ESRD on HD MWF He does appear volume overloaded but satting well on 3 L nasal cannula.  Potassium and bicarb normal.   -Nephrology following   QT prolongation Cardiac monitoring.  Continue to correct electrolytes   Type 2 diabetes A1c 6.5 in December 2023.  Sensitive sliding scale insulin every 4 hours.  Blood sugars fairly well-controlled.      Subjective: Seen on HD. Without complaints  Physical Exam: Vitals:   11/13/22 1123 11/13/22 1130 11/13/22 1134 11/13/22 1200  BP: 104/77 115/66 116/72 (!) 119/59  Pulse: (!) 111 (!) 111 (!) 111 (!) 112  Resp: 16 15 16 18   Temp:    97.6 F (36.4 C)  TempSrc:    Oral  SpO2:  100% 100% 99%  Weight:   101.8 kg    General exam: Awake, laying in bed, in nad Respiratory system: Normal respiratory effort, no wheezing Cardiovascular system: regular rate, s1, s2 Gastrointestinal system: Soft, nondistended, positive BS Central nervous system: CN2-12 grossly intact, strength intact Extremities: Perfused, no clubbing Skin: Normal skin turgor, no notable skin lesions seen Psychiatry: Mood normal // no visual hallucinations   Data Reviewed:  Labs reviewed: Na 133, K 3.5, Cr 6.17, Hgb 8.1  Family Communication: Pt in room, family not at bedside  Disposition: Status is: Inpatient Remains inpatient appropriate because: Severity of illness  Planned Discharge Destination: Skilled nursing facility    Author: Rickey Barbara, MD 11/13/2022 5:41 PM  For on call review [email or web address].

## 2022-11-13 NOTE — Evaluation (Signed)
Physical Therapy Evaluation Patient Details Name: Justin [name] MRN: [id number] DOB: May 08, 1946 Today's Date: 11/13/2022  History of Present Illness  Pt is a 77 y.o. male admitted 4/28 from SNF with syncope. Pt with recent hospitalization at [facility] in April 2024 for NSTEMI. 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 PTA pt was at [facility] for rehab following recent hospitalizations. Prior to Dec 2023, pt lived at home alone and independent, amb with SPC vs RW. Pt currently with functional limitations due to the deficits listed below (see PT Problem List). On eval, pt required +2 mod assist supine to sit, and min/mod assist lateral scoots EOB. Hypersensitivity bilat feet is limiting pt's ability to progress standing and ambulation. Pt will benefit from acute skilled PT to increase their independence and safety with mobility to allow discharge.  Pt may benefit from slide board transfers initially with hopeful progression to standing/amb as neuropathy pain improves.        Recommendations for follow up therapy are one component of a multi-disciplinary discharge planning process, led by the attending physician.  Recommendations may be updated based on patient status, additional functional criteria and insurance authorization.  Follow Up Recommendations Can patient physically be transported by private vehicle: No     Assistance Recommended at Discharge Frequent or constant Supervision/Assistance  Patient can return home with the following  Two people to help with walking and/or transfers;A lot of help with bathing/dressing/bathroom    Equipment  Recommendations Other (comment);Wheelchair (measurements PT) (sliding board)  Recommendations for Other Services       Functional Status Assessment Patient has had a recent decline in their functional status and demonstrates the ability to make significant improvements in function in a reasonable and predictable amount of time.     Precautions / Restrictions Precautions Precautions: Fall;Other (comment) Precaution Comments: pressure sores bilat heels, hypersensitivity bilat feet      Mobility  Bed Mobility Overal bed mobility: Needs Assistance Bed Mobility: Supine to Sit     Supine to sit: +2 for physical assistance, Mod assist     General bed mobility comments: +rail, increased time, cues for sequencing    Transfers                   General transfer comment: lateral scoots EOB min/mod assist. Unable to tolerate standing attempt due to hypersensitivity bilat feet.    Ambulation/Gait                  Stairs            Wheelchair Mobility    Modified Rankin (Stroke Patients Only)       Balance Overall balance assessment: Needs assistance Sitting-balance support: Feet supported, Bilateral upper extremity supported Sitting balance-Leahy Scale: Fair                                       Pertinent Vitals/Pain Pain Assessment Pain Assessment: Faces Faces Pain Scale: Hurts even more Pain Location: bilat feet Pain Descriptors / Indicators: Tender, Grimacing, Guarding Pain Intervention(s): Limited activity within patient's tolerance, Monitored during session, Repositioned  Home Living Family/patient expects to be discharged to:: Skilled nursing facility                   Additional Comments: Pt lived at home alone prior to 06/2022 hospitalization. Since that time he has either been at [facility] or additional hospitalizations.    Prior Function               Mobility Comments: Prior to 06/2022 hospitalization, pt lived  alone mod I with SPC vs RW. Still driving.       Hand Dominance        Extremity/Trunk Assessment   Upper Extremity Assessment Upper Extremity Assessment: Defer to OT evaluation    Lower Extremity Assessment Lower Extremity Assessment: Generalized weakness;RLE deficits/detail;LLE deficits/detail RLE Deficits / Details: hypersensitivity bilat feet due to neuropathy, pressure sore at heel (large fluid filled blister) RLE Sensation: history of peripheral neuropathy LLE Deficits / Details: hypersensitivity bilat feet due to neuropathy, pressure sore at heel (large fluid filled blister) LLE Sensation: history of peripheral neuropathy       Communication   Communication: No difficulties  Cognition Arousal/Alertness: Awake/alert Behavior During Therapy: WFL for tasks assessed/performed Overall Cognitive Status: Within Functional Limits for tasks assessed                                          General Comments General comments (skin integrity, edema, etc.): VSS on 2L    Exercises     Assessment/Plan    PT Assessment Patient needs continued PT services  PT Problem List Decreased strength;Decreased balance;Pain;Decreased mobility;Decreased activity tolerance       PT Treatment Interventions Functional mobility training;DME instruction;Therapeutic activities;Gait training;Therapeutic exercise;Balance training;Patient/family education    PT Goals (Current goals can be found in the Care Plan section)  Acute Rehab PT Goals Patient Stated Goal: to walk PT Goal Formulation: With patient Time For Goal Achievement: 11/27/22 Potential to Achieve Goals: Fair    Frequency Min 1X/week     Co-evaluation PT/OT/SLP Co-Evaluation/Treatment: Yes Reason for Co-Treatment: For patient/therapist safety;To address functional/ADL transfers           AM-PAC PT "6 Clicks" Mobility  Outcome Measure Help needed turning from your back to your side while in a flat bed  without using bedrails?: A Lot Help needed moving from lying on your back to sitting on the side of a flat bed without using bedrails?: A Lot Help needed moving to and from a bed to a chair (including a wheelchair)?: Total Help needed standing up from a chair using your arms (e.g., wheelchair or bedside chair)?: Total Help needed to walk in hospital room?: Total Help needed climbing 3-5 steps with a railing? : Total 6 Click Score: 8    End of Session Equipment Utilized During Treatment: Oxygen Activity Tolerance: Patient tolerated treatment well Patient left: in bed;with call bell/phone within reach Nurse Communication: Mobility status PT Visit Diagnosis: Difficulty in walking, not elsewhere classified (R26.2);Pain;Other abnormalities of gait and mobility (R26.89) Pain - part of body: Ankle and joints of foot    Time: 1210-1226 PT Time Calculation (min) (ACUTE ONLY): 16 min   Charges:   PT Evaluation $PT Eval Moderate Complexity: 1 Mod          Ferd Glassing., PT  Office # [phone]   Ilda Foil 11/13/2022, 12:40 PM

## 2022-11-14 DIAGNOSIS — N186 End stage renal disease: Secondary | ICD-10-CM | POA: Diagnosis not present

## 2022-11-14 DIAGNOSIS — R55 Syncope and collapse: Secondary | ICD-10-CM | POA: Diagnosis not present

## 2022-11-14 DIAGNOSIS — Z992 Dependence on renal dialysis: Secondary | ICD-10-CM | POA: Diagnosis not present

## 2022-11-14 DIAGNOSIS — J810 Acute pulmonary edema: Secondary | ICD-10-CM | POA: Diagnosis not present

## 2022-11-14 LAB — GLUCOSE, CAPILLARY
Glucose-Capillary: 105 mg/dL — ABNORMAL HIGH (ref 70–99)
Glucose-Capillary: 109 mg/dL — ABNORMAL HIGH (ref 70–99)
Glucose-Capillary: 115 mg/dL — ABNORMAL HIGH (ref 70–99)
Glucose-Capillary: 85 mg/dL (ref 70–99)
Glucose-Capillary: 86 mg/dL (ref 70–99)
Glucose-Capillary: 93 mg/dL (ref 70–99)
Glucose-Capillary: 99 mg/dL (ref 70–99)

## 2022-11-14 LAB — COMPREHENSIVE METABOLIC PANEL
ALT: 22 U/L (ref 0–44)
AST: 21 U/L (ref 15–41)
Albumin: 1.9 g/dL — ABNORMAL LOW (ref 3.5–5.0)
Alkaline Phosphatase: 74 U/L (ref 38–126)
Anion gap: 10 (ref 5–15)
BUN: 14 mg/dL (ref 8–23)
CO2: 26 mmol/L (ref 22–32)
Calcium: 7.8 mg/dL — ABNORMAL LOW (ref 8.9–10.3)
Chloride: 95 mmol/L — ABNORMAL LOW (ref 98–111)
Creatinine, Ser: 4.67 mg/dL — ABNORMAL HIGH (ref 0.61–1.24)
GFR, Estimated: 12 mL/min — ABNORMAL LOW (ref >60)
Glucose, Bld: 119 mg/dL — ABNORMAL HIGH (ref 70–99)
Potassium: 3.6 mmol/L (ref 3.5–5.1)
Sodium: 131 mmol/L — ABNORMAL LOW (ref 135–145)
Total Bilirubin: 0.8 mg/dL (ref 0.3–1.2)
Total Protein: 6.6 g/dL (ref 6.5–8.1)

## 2022-11-14 LAB — CBC
HCT: 25.3 % — ABNORMAL LOW (ref 39.0–52.0)
Hemoglobin: 8.1 g/dL — ABNORMAL LOW (ref 13.0–17.0)
MCH: 33.2 pg (ref 26.0–34.0)
MCHC: 32 g/dL (ref 30.0–36.0)
MCV: 103.7 fL — ABNORMAL HIGH (ref 80.0–100.0)
Platelets: 204 10*3/uL (ref 150–400)
RBC: 2.44 MIL/uL — ABNORMAL LOW (ref 4.22–5.81)
RDW: 18.1 % — ABNORMAL HIGH (ref 11.5–15.5)
WBC: 7.9 10*3/uL (ref 4.0–10.5)
nRBC: 0 % (ref 0.0–0.2)

## 2022-11-14 NOTE — Progress Notes (Signed)
[facility] Progress Note  Subjective: seen in room. No c/o's.   Vitals:   11/14/22 0356 11/14/22 0722 11/14/22 1140 11/14/22 1555  BP: 97/69 (!) 93/52 107/68 124/63  Pulse: (!) 109 (!) 105 (!) 107 (!) 103  Resp: 18 17 18 20   Temp: 98 F (36.7 C) 97.8 F (36.6 C) 97.8 F (36.6 C) 97.6 F (36.4 C)  TempSrc: Oral Oral Oral Oral  SpO2: 93% 100% 100% 96%  Weight: 108.7 kg       Exam: Gen no distress, alert No rash, cyanosis or gangrene Sclera anicteric, throat clear  No jvd or bruits Chest clear bilat to bases, no rales/ wheezing RRR no MRG Abd soft ntnd no mass or ascites +bs Ext trace bilat LE edema Neuro is as above    LUA AVF+bruit        Home meds include - asa, eliquis, midodrine 10mg  pre hd mwf or tid, prilosec, protonix, lyrica, velphoro 500mg  ac tid, zyloprim, amiodarone, lipitor, sensipar 30 qd, norco prn, lokelma 10gm biw, lopressor 12.5 bid, prns/ vits/ supps      OP HD: [facility] MWF 3h  450/600  99kg  2/2.5 bath  LUA AVF  Hep none - last HD 4/26, post wt 111kg, coming off 10+kg over last 2 sessions - rocaltrol 0.75 mcg po tiw - venofer 100mg  tiw thru 5/08 - mircera 200 mcg IV q 2 wks, last 4/26 - last Hb 7.7 on 4/24     Assessment/ Plan: LOC episode/ AMS - had brief CPR x 4 min at SNF before EMS arrived. Was somnolent here x 24 hrs then improved the next day. ABG showed pCO2 of 57. Not sure cause of AMS but has resolved.  PNA - getting IV abx here for suspected PNA. D#6 today.  SP cholecystotomy - during recent admit at [facility]. Acute upper GIB - melena on initial ED exam, recent EGD showed duodenal polyps. Pt rec'd 1u prbcs and GI did capsule endo showing tiny gastric AVMs vs localized gastritis. No bleeding. Eliquis resumed. Acute hypoxic resp failure/ vol overload - pt admitted 10kg over dry wt w/ diffuse edema on exam. Got off 7 L w/ 1st two HD sessions and vol excess is mostly resolved on exam.  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 Next HD Monday.  BP -  BP's are soft here, low dose home metoprolol is on hold for now. Getting home midodrine 10mg  tid here.  Parox afib - BB on hold. GI said okay to resume.  Anemia esrd - Hb here in 7s, close to OP recent labs. Continuing the outpt IV iron load while here. Not due for esa for a while. Follow.  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 Cont po vdra, sensipar qd and velphoro as binder.  Dispo - pending dc to SNF    Vinson Moselle MD [facility] 11/14/2022, 5:18 PM  Recent Labs  Lab 11/09/22 1752 11/10/22 0127 11/13/22 0141 11/13/22 0721 11/14/22 0144  HGB 8.1*   < >  --  8.1* 8.1*  ALBUMIN  --    < > 1.9*  --  1.9*  CALCIUM  --    < > 8.4*  --  7.8*  PHOS 7.2*  --   --   --   --   CREATININE  --    < > 6.17*  --  4.67*  K  --    < > 3.5  --  3.6   < > = values in  this interval not displayed.    No results for input(s): "IRON", "TIBC", "FERRITIN" in the last 168 hours. Inpatient medications:  apixaban  5 mg Oral BID   calcitRIOL  0.75 mcg Oral Q M,W,F-HD   Chlorhexidine Gluconate Cloth  6 each Topical Q0600   Chlorhexidine Gluconate Cloth  6 each Topical Q0600   cinacalcet  30 mg Oral Q supper   insulin aspart  0-9 Units Subcutaneous Q4H   midodrine  10 mg Oral TID WC   pantoprazole  40 mg Oral BID   pregabalin  75 mg Oral QHS   sucroferric oxyhydroxide  500 mg Oral TID WC    ferric gluconate (FERRLECIT) IVPB Stopped (11/13/22 1135)   acetaminophen **OR** acetaminophen

## 2022-11-14 NOTE — Progress Notes (Signed)
Progress Note   Patient: Justin [name] UEA:[id number] DOB: September 02, 1945 DOA: 11/08/2022     5 DOS: the patient was seen and examined on 11/14/2022   Brief hospital course: 77 y.o. 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  Patient was recently admitted to [facility] 4/2-4/23 for acute cholecystitis/septic shock and NSTEMI.  He was initially in the ICU requiring vasopressor support.  General surgery had decided against surgical intervention due to patient's history of aortic stenosis.  He was treated with antibiotics and underwent cholecystectomy tube placement by IR.  Ultimately discharged to SNF with cholecystectomy tube in place.  He was requiring supplemental oxygen at the time of discharge.  Per review of discharge summary, had biopsy of stomach/fundus mass done at [facility] and reportedly results inconclusive per pathologist who recommended deeper biopsy samples in the future.  Patient Justin [name] of her syncopal episode.  Upon initial examination patient was found to be hypoxic hemoglobin was 7.4 and melena on rectal examination.  Chest x-ray also showed worsening bilateral airspace disease due to atelectasis or pneumonia. 4/30: Patient's mental status is improved, blood pressure has stabilized with midodrine and anemia/hemoglobin is stable.  Assessment and Plan: Syncope Altered mental status -Patient still continues to be very somnolent. - CT of the head is negative for any acute abnormalities - TSH within normal limits, ammonia is 37. - EEG ordered and shows no epileptiform activity and signs of generalized encephalopathy. -ABG  shows mild hypercapnia.Will order BIPAP but patient did not want the BiPAP. - Mentation currently much improved, pt conversing appropriately     Acute GI bleed, likely upper Acute on chronic anemia secondary to blood loss Hemoglobin currently 7.4, was ranging between 7.9-10.3 on labs done last month.  He had melena on rectal exam done in the ED and FOBT positive.  ED physician has consulted Derma GI.  EGD done last month showing normal esophagus, hiatal hernia, single submucosal papule (nodule) found in the stomach, and 2 duodenal polyps.   -received 1 unit PRBC transfusion this visit -Gastroenterologist following. Capsule study demonstrating tiny gatric AVM vs mild localized gastritis vs healing from previous biopsy site without bleeding -no plan for repeat endo while inpt. Recs for outpt EUS and convert protonix to 40mg  po bid -Recently discussed with GI. OK to continue eliquis   Hypotension: - Patient has been started on midodrine monitor blood pressure after the initial dose. - Patient has chronic hypotension, blood pressure will improved on midodrine.   Pneumonia ruled in Chest x-ray showing worsening bilateral lower lung airspace disease which may be due to atelectasis or pneumonia, possible small left effusion, and mild cardiomegaly.  No fever or leukocytosis.  Last echo done in December 2023 showing EF 60 to 65%, however, he appears volume overloaded on exam with 3+ bilateral lower extremity edema.   -Since however procalcitonin elevated started the patient on IV cefepime and vancomycin. -Patient is currently on 1.5 L oxygen via nasal cannula, respiratory status appears to be stable. -Attempt to wean O2, however pt observed to desaturate to the mid-80's while asleep on RA. O2 sats improved when awake and pt noted to be alert and oriented -Suspect pt would benefit from nocutrnal O2 -recommend sleep  study as outpatient   Paroxysmal A-fib/flutter OK to resume eliquis per GI at this  time. Discussed with Gastroenterology -continue eliquis   ESRD on HD MWF He does appear volume overloaded but satting well on 3 L nasal cannula.  Potassium and bicarb normal.   -Nephrology following   QT prolongation Cardiac monitoring.  Continue to correct electrolytes   Type 2 diabetes A1c 6.5 in December 2023.  Sensitive sliding scale insulin every 4 hours.   -Blood sugars remain stable and controlled  Hyponatremia -continue volume management via HD      Subjective: Without complaints  Physical Exam: Vitals:   11/14/22 0356 11/14/22 0722 11/14/22 1140 11/14/22 1555  BP: 97/69 (!) 93/52 107/68 124/63  Pulse: (!) 109 (!) 105 (!) 107 (!) 103  Resp: 18 17 18 20   Temp: 98 F (36.7 C) 97.8 F (36.6 C) 97.8 F (36.6 C) 97.6 F (36.4 C)  TempSrc: Oral Oral Oral Oral  SpO2: 93% 100% 100% 96%  Weight: 108.7 kg      General exam: Awake, laying in bed, in nad Respiratory system: Normal respiratory effort, no wheezing Cardiovascular system: regular rate, s1, s2 Gastrointestinal system: Soft, nondistended, positive BS Central nervous system: CN2-12 grossly intact, strength intact Extremities: Perfused, no clubbing Skin: Normal skin turgor, no notable skin lesions seen Psychiatry: Mood normal // no visual hallucinations   Data Reviewed:  Labs reviewed: Na 131, K 3.6, Cr 4.67, Hgb 8.1  Family Communication: Pt in room, family not at bedside  Disposition: Status is: Inpatient Remains inpatient appropriate because: Severity of illness  Planned Discharge Destination: Skilled nursing facility    Author: Rickey Barbara, MD 11/14/2022 4:22 PM  For on call review [email or web address].

## 2022-11-14 NOTE — NC FL2 (Signed)
[place] MEDICAID FL2 LEVEL OF CARE FORM     IDENTIFICATION  Patient Name: Justin [name] Birthdate: 28-May-1946 Sex: male Admission Date (Current Location): 11/08/2022  [facility] and [place] Number:  Producer, television/film/video and Address:  [facility], [address], [place], [place] [address]      Provider Number: [id number]  Attending Physician Name and Address:  Jerald Kief, MD  Relative Name and Phone Number:       Current Level of Care: Hospital Recommended Level of Care: Skilled Nursing Facility Prior Approval Number:    Date Approved/Denied:   PASRR Number: [id number]  Discharge Plan: SNF    Current Diagnoses: Patient Active Problem List   Diagnosis Date Noted   Acute encephalopathy 11/09/2022   Syncope 11/09/2022   Acute blood loss anemia 11/09/2022   Melena 11/09/2022   History of colonic polyps 11/09/2022   ESRD (end stage renal disease) on dialysis (HCC) 11/09/2022   Gastric nodule 11/09/2022   Rectal bleeding 09/17/2022   Colon ulcer 09/17/2022   Lower GI bleeding 09/17/2022   GI bleed 09/16/2022   Prolonged QT interval 09/16/2022   History of prostate cancer 09/16/2022   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   History of colon polyps 09/16/2022   Lower GI bleed 09/12/2022   Acute GI bleeding 09/10/2022   Wide-complex tachycardia 06/17/2022   Hypotension 06/13/2022   Obesity (BMI 30-39.9) 06/13/2022   Perirectal abscess 06/13/2022   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   Murmur, cardiac 01/27/2022   Chronic back pain 01/27/2022   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/27/2022   GERD (gastroesophageal reflux disease) 11/11/2021   High risk medication use 11/11/2021   History of cardioversion 11/11/2021   Paroxysmal atrial fibrillation with RVR (HCC) 11/11/2021   PVC (premature ventricular contraction) 11/11/2021   Nonrheumatic aortic valve stenosis 11/06/2021   Mixed hyperlipidemia 11/06/2021   Abdominal  mass 05/05/2021   First degree AV block 07/13/2020   Paroxysmal A-fib (HCC) 01/29/2020   Wound infection 01/28/2020   H/O vitrectomy 12/07/2019   Macular pucker, 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 determined by examination associated with type 2 diabetes mellitus (HCC) 10/24/2019   Stable treated proliferative diabetic retinopathy of left eye determined by examination associated with type 2 diabetes mellitus (HCC) 10/24/2019   ESRD on hemodialysis (HCC) 07/29/2017   Anticoagulated 07/13/2017   Malignant neoplasm of prostate (HCC) 07/12/2017   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   Coagulation defect, unspecified (HCC) 01/08/2017   Idiopathic gout 01/08/2017   Acute on chronic blood loss anemia 11/20/2015   Blurred vision, left eye 07/10/2011   Essential hypertension 07/10/2011   DM (diabetes mellitus) (HCC) 07/10/2011    Orientation RESPIRATION BLADDER Height & Weight     Self, Time, Situation, Place  O2 (3L nasal cannula) Continent Weight: 239 lb 10.2 oz (108.7 kg) Height:     BEHAVIORAL SYMPTOMS/MOOD NEUROLOGICAL BOWEL NUTRITION STATUS      Continent Diet (See DC Summary)  AMBULATORY STATUS COMMUNICATION OF NEEDS Skin   Extensive Assist   PU Stage and Appropriate Care (Stage I on buttocks)                       Personal Care Assistance Level of Assistance  Bathing, Feeding, Dressing Bathing Assistance: Maximum assistance Feeding assistance: Limited assistance Dressing Assistance: Maximum assistance     Functional Limitations Info  SPECIAL CARE FACTORS FREQUENCY  PT (By licensed PT), OT (By licensed OT)     PT Frequency: 5x/week OT Frequency: 5x/week            Contractures Contractures Info: Not present    Additional Factors Info  Code Status, Allergies, Psychotropic, Insulin Sliding Scale Code Status Info: Full Allergies Info: NKA Psychotropic Info:  Lyrica Insulin Sliding Scale Info: See DC Summary       Current Medications (11/14/2022):  This is the current hospital active medication list Current Facility-Administered Medications  Medication Dose Route Frequency Provider Last Rate Last Admin   acetaminophen (TYLENOL) tablet 650 mg  650 mg Oral Q6H PRN John Giovanni, MD       Or   acetaminophen (TYLENOL) suppository 650 mg  650 mg Rectal Q6H PRN John Giovanni, MD       apixaban Everlene Balls) tablet 5 mg  5 mg Oral BID Leander Rams, RPH   5 mg at 11/14/22 0825   calcitRIOL (ROCALTROL) capsule 0.75 mcg  0.75 mcg Oral Q M,W,F-HD Delano Metz, MD   0.75 mcg at 11/13/22 1323   Chlorhexidine Gluconate Cloth 2 % PADS 6 each  6 each Topical Q0600 Delano Metz, MD   6 each at 11/14/22 0421   Chlorhexidine Gluconate Cloth 2 % PADS 6 each  6 each Topical Q0600 Delano Metz, MD   6 each at 11/14/22 0421   cinacalcet (SENSIPAR) tablet 30 mg  30 mg Oral Q supper Delano Metz, MD   30 mg at 11/13/22 1715   ferric gluconate (FERRLECIT) 62.5 mg in sodium chloride 0.9 % 100 mL IVPB  62.5 mg Intravenous Q M,W,F-HD Delano Metz, MD   Stopped at 11/13/22 1135   insulin aspart (novoLOG) injection 0-9 Units  0-9 Units Subcutaneous Q4H John Giovanni, MD   1 Units at 11/12/22 0022   midodrine (PROAMATINE) tablet 10 mg  10 mg Oral TID WC Jerald Kief, MD   10 mg at 11/14/22 0825   pantoprazole (PROTONIX) EC tablet 40 mg  40 mg Oral BID Leander Rams, RPH   40 mg at 11/14/22 0825   pregabalin (LYRICA) capsule 75 mg  75 mg Oral QHS Jerald Kief, MD   75 mg at 11/13/22 2048   sucroferric oxyhydroxide (VELPHORO) chewable tablet 500 mg  500 mg Oral TID WC Delano Metz, MD   500 mg at 11/14/22 0825     Discharge Medications: Please see discharge summary for a list of discharge medications.  Relevant Imaging Results:  Relevant Lab Results:   Additional Information SSN: [id number]. Dialysis at [facility] on MWF  [place], [place]

## 2022-11-15 DIAGNOSIS — J810 Acute pulmonary edema: Secondary | ICD-10-CM | POA: Diagnosis not present

## 2022-11-15 DIAGNOSIS — R55 Syncope and collapse: Secondary | ICD-10-CM | POA: Diagnosis not present

## 2022-11-15 LAB — GLUCOSE, CAPILLARY
Glucose-Capillary: 112 mg/dL — ABNORMAL HIGH (ref 70–99)
Glucose-Capillary: 117 mg/dL — ABNORMAL HIGH (ref 70–99)
Glucose-Capillary: 123 mg/dL — ABNORMAL HIGH (ref 70–99)
Glucose-Capillary: 76 mg/dL (ref 70–99)
Glucose-Capillary: 78 mg/dL (ref 70–99)
Glucose-Capillary: 98 mg/dL (ref 70–99)
Glucose-Capillary: 98 mg/dL (ref 70–99)

## 2022-11-15 MED ORDER — CHLORHEXIDINE GLUCONATE CLOTH 2 % EX PADS
6.0000 | MEDICATED_PAD | Freq: Every day | CUTANEOUS | Status: DC
  Administered 2022-11-16 – 2022-11-19 (×3): 6 via TOPICAL

## 2022-11-15 NOTE — Plan of Care (Signed)
  Problem: Education: Goal: Ability to describe self-care measures that may prevent or decrease complications (Diabetes Survival Skills Education) will improve Outcome: Progressing   Problem: Coping: Goal: Ability to adjust to condition or change in health will improve Outcome: Progressing   Problem: Health Behavior/Discharge Planning: Goal: Ability to identify and utilize available resources and services will improve Outcome: Progressing Goal: Ability to manage health-related needs will improve Outcome: Progressing   Problem: Metabolic: Goal: Ability to maintain appropriate glucose levels will improve Outcome: Progressing   Problem: Nutritional: Goal: Maintenance of adequate nutrition will improve Outcome: Progressing

## 2022-11-15 NOTE — Progress Notes (Signed)
Progress Note   Patient: Justin [name] ZOX:[id number] DOB: 20-Jan-1946 DOA: 11/08/2022     6 DOS: the patient was seen and examined on 11/15/2022   Brief hospital course: 77 y.o. 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  Patient was recently admitted to [facility] 4/2-4/23 for acute cholecystitis/septic shock and NSTEMI.  He was initially in the ICU requiring vasopressor support.  General surgery had decided against surgical intervention due to patient's history of aortic stenosis.  He was treated with antibiotics and underwent cholecystectomy tube placement by IR.  Ultimately discharged to SNF with cholecystectomy tube in place.  He was requiring supplemental oxygen at the time of discharge.  Per review of discharge summary, had biopsy of stomach/fundus mass done at [facility] and reportedly results inconclusive per pathologist who recommended deeper biopsy samples in the future.  Patient Justin [name] of her syncopal episode.  Upon initial examination patient was found to be hypoxic hemoglobin was 7.4 and melena on rectal examination.  Chest x-ray also showed worsening bilateral airspace disease due to atelectasis or pneumonia. 4/30: Patient's mental status is improved, blood pressure has stabilized with midodrine and anemia/hemoglobin is stable.  Assessment and Plan: Syncope Toxic metabolic encephalopathy -Patient still continues to be very somnolent. - CT of the head is negative for any acute abnormalities - TSH within normal limits, ammonia was unremarkable - EEG ordered and shows no epileptiform activity and signs of generalized  encephalopathy. -ABG shows mild hypercapnia.Will order BIPAP but patient did not want the BiPAP. - Mentation currently much improved, pt conversing appropriately     Acute GI bleed, likely upper Acute on chronic anemia secondary to blood loss Hemoglobin currently 7.4, was ranging between 7.9-10.3 on labs done last month.  He had melena on rectal exam done in the ED and FOBT positive.  ED physician has consulted [facility].  EGD done last month showing normal esophagus, hiatal hernia, single submucosal papule (nodule) found in the stomach, and 2 duodenal polyps.   -received 1 unit PRBC transfusion this visit -Gastroenterologist following. Capsule study demonstrating tiny gatric AVM vs mild localized gastritis vs healing from previous biopsy site without bleeding -no plan for repeat endo while inpt. Recs for outpt EUS and convert protonix to 40mg  po bid -Recently discussed with GI. OK to continue eliquis   Hypotension: - Patient has been started on midodrine monitor blood pressure after the initial dose. - Patient has chronic hypotension, blood pressure will improved on midodrine.   Pneumonia ruled in Chest x-ray showing worsening bilateral lower lung airspace disease which may be due to atelectasis or pneumonia, possible small left effusion, and mild cardiomegaly.  No fever or leukocytosis.  Last echo done in December 2023 showing EF 60 to 65%, however, he appears volume overloaded on exam with 3+ bilateral lower extremity edema.   -Since however procalcitonin elevated started the patient on IV cefepime and vancomycin. -Patient is currently on 1.5 L oxygen via nasal cannula, respiratory status appears to be stable. -Attempt to wean O2, however pt observed to desaturate to the mid-80's while asleep on RA. O2 sats improved when awake and pt noted to be alert and oriented -Suspect pt would benefit from nocutrnal O2 -recommend sleep  study as outpatient   Paroxysmal A-fib/flutter OK to resume  eliquis per GI at this time. Discussed with Gastroenterology -continue eliquis   ESRD on HD MWF He does appear volume overloaded but satting well on 3 L nasal cannula.  Potassium and bicarb normal.   -Nephrology following   QT prolongation Cardiac monitoring.  Continue to correct electrolytes   Type 2 diabetes A1c 6.5 in December 2023.  Sensitive sliding scale insulin every 4 hours.   -Blood sugars remain stable and controlled  Hyponatremia -continue volume management via HD      Subjective: No complaints. Asking about when he would be returning to SNF  Physical Exam: Vitals:   11/15/22 0418 11/15/22 0741 11/15/22 1315 11/15/22 1557  BP: 106/64 135/85 116/72 110/73  Pulse: (!) 109 (!) 110 (!) 107 (!) 107  Resp: 19 20 19 15   Temp: 98.4 F (36.9 C) 97.7 F (36.5 C) (!) 97.5 F (36.4 C) 97.9 F (36.6 C)  TempSrc: Oral Oral Oral Oral  SpO2: 100% 100% 100% 100%  Weight: 107.2 kg      General exam: Conversant, in no acute distress Respiratory system: normal chest rise, clear, no audible wheezing Cardiovascular system: regular rhythm, s1-s2 Gastrointestinal system: Nondistended, nontender, pos BS Central nervous system: No seizures, no tremors Extremities: No cyanosis, no joint deformities Skin: No rashes, no pallor Psychiatry: Affect normal // no auditory hallucinations   Data Reviewed:  There are no new results to review at this time.  Family Communication: Pt in room, family not at bedside  Disposition: Status is: Inpatient Remains inpatient appropriate because: Severity of illness  Planned Discharge Destination: Skilled nursing facility    Author: Rickey Barbara, MD 11/15/2022 6:03 PM  For on call review [email or web address].

## 2022-11-15 NOTE — Progress Notes (Signed)
[facility] Progress Note  Subjective: seen in room. No c/o's.   Vitals:   11/15/22 0418 11/15/22 0741 11/15/22 1315 11/15/22 1557  BP: 106/64 135/85 116/72 110/73  Pulse: (!) 109 (!) 110 (!) 107 (!) 107  Resp: 19 20 19 15   Temp: 98.4 F (36.9 C) 97.7 F (36.5 C) (!) 97.5 F (36.4 C) 97.9 F (36.6 C)  TempSrc: Oral Oral Oral Oral  SpO2: 100% 100% 100% 100%  Weight: 107.2 kg       Exam: Gen no distress, alert No rash, cyanosis or gangrene Sclera anicteric, throat clear  No jvd or bruits Chest clear bilat to bases, no rales/ wheezing RRR no MRG Abd soft ntnd no mass or ascites +bs Ext trace bilat LE edema Neuro is as above    LUA AVF+bruit        Home meds include - asa, eliquis, midodrine 10mg  pre hd mwf or tid, prilosec, protonix, lyrica, velphoro 500mg  ac tid, zyloprim, amiodarone, lipitor, sensipar 30 qd, norco prn, lokelma 10gm biw, lopressor 12.5 bid, prns/ vits/ supps      OP HD: [facility] 3h  450/600  99kg  2/2.5 bath  LUA AVF  Hep none - last HD 4/26, post wt 111kg, coming off 10+kg over last 2 sessions - rocaltrol 0.75 mcg po tiw - venofer 100mg  tiw thru 5/08 - mircera 200 mcg IV q 2 wks, last 4/26 - last Hb 7.7 on 4/24     Assessment/ Plan: LOC episode/ AMS - had brief CPR x 4 min at SNF before EMS arrived. Was somnolent here x 24 hrs then improved the next day. ABG showed pCO2 of 57. Not sure cause of AMS but has resolved.  PNA - getting IV abx here for suspected PNA. SP cholecystotomy - during recent admit at [facility]. Acute upper GIB - melena on initial ED exam, recent EGD showed duodenal polyps. Pt rec'd 1u prbcs and GI did capsule endo showing tiny gastric AVMs vs localized gastritis. No bleeding. Eliquis resumed. Acute hypoxic resp failure/ vol overload - pt admitted 10kg over dry wt w/ diffuse edema on exam. Got off 7 L w/ 1st two HD sessions and vol excess mostly resolved. Max UF w/ next HD.  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 Next HD Monday.  BP  - BP's are soft here, low dose home metoprolol is on hold for now. Getting home midodrine 10mg  tid here.  Parox afib - BB on hold. GI said okay to resume.  Anemia esrd - Hb here in 7s, close to OP recent labs. Continuing the outpt IV iron load while here. Not due for esa for a while. Follow.  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 Cont po vdra, sensipar qd and velphoro as binder.  Dispo - pending dc to SNF    Vinson Moselle MD [facility] 11/15/2022, 6:17 PM  Recent Labs  Lab 11/09/22 1752 11/10/22 0127 11/13/22 0141 11/13/22 0721 11/14/22 0144  HGB 8.1*   < >  --  8.1* 8.1*  ALBUMIN  --    < > 1.9*  --  1.9*  CALCIUM  --    < > 8.4*  --  7.8*  PHOS 7.2*  --   --   --   --   CREATININE  --    < > 6.17*  --  4.67*  K  --    < > 3.5  --  3.6   < > = values in this  interval not displayed.    No results for input(s): "IRON", "TIBC", "FERRITIN" in the last 168 hours. Inpatient medications:  apixaban  5 mg Oral BID   calcitRIOL  0.75 mcg Oral Q M,W,F-HD   Chlorhexidine Gluconate Cloth  6 each Topical Q0600   Chlorhexidine Gluconate Cloth  6 each Topical Q0600   cinacalcet  30 mg Oral Q supper   insulin aspart  0-9 Units Subcutaneous Q4H   midodrine  10 mg Oral TID WC   pantoprazole  40 mg Oral BID   pregabalin  75 mg Oral QHS   sucroferric oxyhydroxide  500 mg Oral TID WC    ferric gluconate (FERRLECIT) IVPB Stopped (11/13/22 1135)   acetaminophen **OR** acetaminophen

## 2022-11-16 DIAGNOSIS — Z992 Dependence on renal dialysis: Secondary | ICD-10-CM | POA: Diagnosis not present

## 2022-11-16 DIAGNOSIS — J810 Acute pulmonary edema: Secondary | ICD-10-CM | POA: Diagnosis not present

## 2022-11-16 DIAGNOSIS — N186 End stage renal disease: Secondary | ICD-10-CM | POA: Diagnosis not present

## 2022-11-16 LAB — COMPREHENSIVE METABOLIC PANEL
ALT: 20 U/L (ref 0–44)
AST: 18 U/L (ref 15–41)
Albumin: 2 g/dL — ABNORMAL LOW (ref 3.5–5.0)
Alkaline Phosphatase: 77 U/L (ref 38–126)
Anion gap: 9 (ref 5–15)
BUN: 25 mg/dL — ABNORMAL HIGH (ref 8–23)
CO2: 28 mmol/L (ref 22–32)
Calcium: 8.3 mg/dL — ABNORMAL LOW (ref 8.9–10.3)
Chloride: 97 mmol/L — ABNORMAL LOW (ref 98–111)
Creatinine, Ser: 7.31 mg/dL — ABNORMAL HIGH (ref 0.61–1.24)
GFR, Estimated: 7 mL/min — ABNORMAL LOW (ref >60)
Glucose, Bld: 113 mg/dL — ABNORMAL HIGH (ref 70–99)
Potassium: 4.1 mmol/L (ref 3.5–5.1)
Sodium: 134 mmol/L — ABNORMAL LOW (ref 135–145)
Total Bilirubin: 0.8 mg/dL (ref 0.3–1.2)
Total Protein: 7 g/dL (ref 6.5–8.1)

## 2022-11-16 LAB — GLUCOSE, CAPILLARY
Glucose-Capillary: 99 mg/dL (ref 70–99)
Glucose-Capillary: 82 mg/dL (ref 70–99)
Glucose-Capillary: 77 mg/dL (ref 70–99)
Glucose-Capillary: 86 mg/dL (ref 70–99)
Glucose-Capillary: 93 mg/dL (ref 70–99)

## 2022-11-16 NOTE — Progress Notes (Addendum)
[facility] Progress Note   Subjective:   Reports he did not sleep well last night, tired this AM. Denies SOB, CP, dizziness, nausea. Denies orthostatic dizziness/palpitations.   Objective Vitals:   11/15/22 2030 11/15/22 2344 11/16/22 0427 11/16/22 0800  BP: 115/77 121/79 113/70 (!) 129/54  Pulse: (!) 108 (!) 110 (!) 107 (!) 107  Resp: 16 17 17 20   Temp: 98.2 F (36.8 C) 97.6 F (36.4 C) 97.6 F (36.4 C) 97.8 F (36.6 C)  TempSrc: Oral Oral Oral Axillary  SpO2: 100% 100% 100% 100%  Weight:       Physical Exam General: Alert male in NAD Heart: RRR, no murmurs, rubs or gallops Lungs: CTA bilaterally, on O2 via [place] Abdomen: Soft, non-distended, +BS Extremities: No edema b/l lower extremities Dialysis Access:  LUE AVF + bruit  Additional Objective Labs: Basic Metabolic Panel: Recent Labs  Lab 11/09/22 1752 11/10/22 0127 11/13/22 0141 11/14/22 0144 11/16/22 0114  NA  --    < > 133* 131* 134*  K  --    < > 3.5 3.6 4.1  CL  --    < > 94* 95* 97*  CO2  --    < > 25 26 28   GLUCOSE  --    < > 109* 119* 113*  BUN  --    < > 25* 14 25*  CREATININE  --    < > 6.17* 4.67* 7.31*  CALCIUM  --    < > 8.4* 7.8* 8.3*  PHOS 7.2*  --   --   --   --    < > = values in this interval not displayed.   Liver Function Tests: Recent Labs  Lab 11/13/22 0141 11/14/22 0144 11/16/22 0114  AST 23 21 18   ALT 24 22 20   ALKPHOS 72 74 77  BILITOT 0.8 0.8 0.8  PROT 6.6 6.6 7.0  ALBUMIN 1.9* 1.9* 2.0*   No results for input(s): "LIPASE", "AMYLASE" in the last 168 hours. CBC: Recent Labs  Lab 11/10/22 0127 11/11/22 0214 11/12/22 0142 11/13/22 0721 11/14/22 0144  WBC 8.8 8.1 8.3 8.0 7.9  NEUTROABS 6.6 6.0  --   --   --   HGB 8.0* 8.6* 8.0* 8.1* 8.1*  HCT 24.2* 26.3* 25.0* 24.8* 25.3*  MCV 103.0* 104.8* 103.7* 100.0 103.7*  PLT 159 174 189 208 204   Blood Culture    Component Value Date/Time   SDES ABSCESS 06/15/2022 1344   SPECREQUEST PERIRECTAL 06/15/2022  1344   CULT  06/15/2022 1344    FEW ESCHERICHIA COLI NO ANAEROBES ISOLATED Performed at [facility], [address] [place], [place] [address]    REPTSTATUS 06/20/2022 FINAL 06/15/2022 1344    Cardiac Enzymes: No results for input(s): "CKTOTAL", "CKMB", "CKMBINDEX", "TROPONINI" in the last 168 hours. CBG: Recent Labs  Lab 11/15/22 1555 11/15/22 2029 11/15/22 2344 11/16/22 0432 11/16/22 0825  GLUCAP 117* 123* 112* 99 82   Iron Studies: No results for input(s): "IRON", "TIBC", "TRANSFERRIN", "FERRITIN" in the last 72 hours. @lablastinr3 @ Studies/Results: No results found. Medications:  ferric gluconate (FERRLECIT) IVPB Stopped (11/13/22 1135)    apixaban  5 mg Oral BID   calcitRIOL  0.75 mcg Oral Q M,W,F-HD   Chlorhexidine Gluconate Cloth  6 each Topical Q0600   Chlorhexidine Gluconate Cloth  6 each Topical Q0600   Chlorhexidine Gluconate Cloth  6 each Topical Q0600   cinacalcet  30 mg Oral Q supper   insulin aspart  0-9 Units  Subcutaneous Q4H   midodrine  10 mg Oral TID WC   pantoprazole  40 mg Oral BID   pregabalin  75 mg Oral QHS   sucroferric oxyhydroxide  500 mg Oral TID WC    Dialysis Orders: [facility] M[facility] 3h  450/600  99kg  2/2.5 bath  LUA AVF  Hep none - last HD 4/26, post wt 111kg, coming off 10+kg over last 2 sessions - rocaltrol 0.75 mcg po tiw - venofer 100mg  tiw thru 5/08 - mircera 200 mcg IV q 2 wks, last 4/26 - last Hb 7.7 on 4/24  Assessment/Plan: LOC episode/ AMS - had brief CPR x 4 min at SNF before EMS arrived. Was somnolent here x 24 hrs then improved the next day. ABG showed pCO2 of 57. AMS has resolved.  PNA - getting IV abx here for suspected PNA. On O2 via [place] for desaturations at night.  SP cholecystotomy - during recent admit at [facility]. Acute upper GIB - melena on initial ED exam, recent EGD showed duodenal polyps. Pt rec'd 1u prbcs and GI did capsule endo showing tiny gastric AVMs vs localized gastritis. No further bleeding.  Eliquis resumed. Acute hypoxic resp failure/ vol overload - pt admitted 10kg over dry wt w/ diffuse edema on exam. Got off 7 L w/ 1st two HD sessions and vol excess mostly resolved. Weight up quite a bit over the weekend, unclear if accurate. I&Os not matching weights. Max UF w/ next HD.  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 Next HD Today.  BP - BP's are soft here, low dose home metoprolol is on hold for now. Getting home midodrine 10mg  tid here.  Parox afib - BB on hold. GI said okay to resume.  Anemia esrd - Hb stable in low 8's, close to OP recent labs. Continuing the outpt IV iron load while here. Not due for esa for a while. Follow.  MBD ckd - CCa in range, phos elevated. Cont po vdra, sensipar qd and velphoro as binder.  Dispo - pending dc to SNF     Rogers Blocker, PA-C 11/16/2022, 8:58 AM  [facility] Pager: [phone]

## 2022-11-16 NOTE — Procedures (Signed)
HD Note:  Some information was entered later than the data was gathered due to patient care needs. The stated time with the data is accurate.Received patient in bed to unit.  Alert and oriented.  Informed consent signed and in chart.   TX duration: 3.25 hours  At 1630, patient BP lowered and patient complained of SOB.  Saline given, O2 increased.  UF turned off. Dr. Juel Burrow was notified with new orders to change goal to keep even. Patient recovered, see flowsheet for details.   Access used: Left upper arm fistula Access issues: No issues  Reported off to the oncoming Dialysis nurse    [facility]

## 2022-11-16 NOTE — Progress Notes (Signed)
Uf turned off and 200 ml ns given and o2 increased to 4 due to rapid decrease in blood pressure Will recheck in 5

## 2022-11-16 NOTE — Progress Notes (Signed)
Uf turned off again due to decrease in bp

## 2022-11-16 NOTE — Progress Notes (Signed)
Pts blood pressure continues to fluxuate.  Leaving uf off for now. Will check bp again in 15 mins.

## 2022-11-16 NOTE — Progress Notes (Signed)
   11/16/22 1955  Pain Assessment  Pain Scale 0-10  Pain Score 0  Fistula / Graft Left Upper arm Arteriovenous fistula  No Placement Date or Time found.   Placed prior to admission: Yes  Orientation: Left  Access Location: Upper arm  Access Type: Arteriovenous fistula  Site Condition No complications  Fistula / Graft Assessment Thrill;Bruit  Status Deaccessed  Drainage Description None  Neurological  Level of Consciousness Alert  Orientation Level Oriented X4  Respiratory  Respiratory Pattern Regular;Unlabored  Chest Assessment Chest expansion symmetrical  Bilateral Breath Sounds Diminished;Rales  R Upper  Breath Sounds Diminished  L Upper Breath Sounds Rales  R Lower Breath Sounds Diminished  L Lower Breath Sounds Diminished  Cough Non-productive  Cardiac  Pulse Regular  Heart Sounds S1, S2  Jugular Venous Distention (JVD) Yes  Cardiac Rhythm ST  GU Assessment  Genitourinary (WDL) X  Genitourinary Symptoms Anuria  Psychosocial  Psychosocial (WDL) WDL   Received patient in bed to unit.  Alert and oriented. X 4 Informed consent signed and in chart.   TX duration: 3.25  Patient tolerated well. - yes Transported back to the room - Yes Alert, with mild c-p distress Hand-off given to patient's nurse. - Myra  Access used: LUAVF Access issues: None  Total UF removed: 300 Medication(s) given: Yes see MAR Post HD VS: B/P 166/148; HR 111: T- 97.6 Post HD weight: see chart   Vasilios Ottaway [facility]

## 2022-11-16 NOTE — Progress Notes (Signed)
Progress Note   Patient: Justin [name]:[id number] DOB: Jul 10, 1946 DOA: 11/08/2022     7 DOS: the patient was seen and examined on 11/16/2022   Brief hospital course: 77 y.o. 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  Patient was recently admitted to [facility] 4/2-4/23 for acute cholecystitis/septic shock and NSTEMI.  He was initially in the ICU requiring vasopressor support.  General surgery had decided against surgical intervention due to patient's history of aortic stenosis.  He was treated with antibiotics and underwent cholecystectomy tube placement by IR.  Ultimately discharged to SNF with cholecystectomy tube in place.  He was requiring supplemental oxygen at the time of discharge.  Per review of discharge summary, had biopsy of stomach/fundus mass done at [facility] and reportedly results inconclusive per pathologist who recommended deeper biopsy samples in the future.  Patient Justin [name] of her syncopal episode.  Upon initial examination patient was found to be hypoxic hemoglobin was 7.4 and melena on rectal examination.  Chest x-ray also showed worsening bilateral airspace disease due to atelectasis or pneumonia. 4/30: Patient's mental status is improved, blood pressure has stabilized with midodrine and anemia/hemoglobin is stable.  Assessment and Plan: Syncope Toxic metabolic encephalopathy -Patient still continues to be very somnolent. - CT of the head is negative for any acute abnormalities - TSH within normal limits, ammonia was unremarkable - EEG ordered and shows no epileptiform activity and signs of generalized  encephalopathy. -ABG shows mild hypercapnia.Will order BIPAP but patient did not want the BiPAP. - Mentation currently much improved, pt conversing appropriately -Medically stable at this time for return to SNF, awaiting insurance auth    Acute GI bleed, likely upper Acute on chronic anemia secondary to blood loss Hemoglobin currently 7.4, was ranging between 7.9-10.3 on labs done last month.  He had melena on rectal exam done in the ED and FOBT positive.  ED physician has consulted [facility].  EGD done last month showing normal esophagus, hiatal hernia, single submucosal papule (nodule) found in the stomach, and 2 duodenal polyps.   -received 1 unit PRBC transfusion this visit -Gastroenterologist following. Capsule study demonstrating tiny gatric AVM vs mild localized gastritis vs healing from previous biopsy site without bleeding -no plan for repeat endo while inpt. Recs for outpt EUS and convert protonix to 40mg  po bid -Recently discussed with GI. OK to continue eliquis   Hypotension: - Patient has been started on midodrine monitor blood pressure after the initial dose. - Patient has chronic hypotension, blood pressure will improved on midodrine.   Pneumonia ruled in Chest x-ray showing worsening bilateral lower lung airspace disease which may be due to atelectasis or pneumonia, possible small left effusion, and mild cardiomegaly.  No fever or leukocytosis.  Last echo done in December 2023 showing EF 60 to 65%, however, he appears volume overloaded on exam with 3+ bilateral lower extremity edema.   -Since however procalcitonin elevated started the patient on IV cefepime and vancomycin. -Patient is currently on 1.5 L oxygen via nasal cannula, respiratory status appears to be stable. -Attempt to wean O2, however pt observed to desaturate to the mid-80's while asleep on RA. O2 sats improved when awake and pt noted to be alert  and oriented -Suspect pt would benefit from nocutrnal O2 -recommend  sleep study as outpatient   Paroxysmal A-fib/flutter OK to resume eliquis per GI at this time. Discussed with Gastroenterology -continue eliquis   ESRD on HD MWF He does appear volume overloaded but satting well on 3 L nasal cannula.  Potassium and bicarb normal.   -Nephrology following   QT prolongation Cardiac monitoring.  Continue to correct electrolytes   Type 2 diabetes A1c 6.5 in December 2023.  Sensitive sliding scale insulin every 4 hours.   -Blood sugars remain stable and controlled  Hyponatremia -continue volume management via HD      Subjective: Without complaints. Eager to return to facility  Physical Exam: Vitals:   11/16/22 1630 11/16/22 1639 11/16/22 1645 11/16/22 1700  BP: (!) 78/61 103/80 (!) 103/59 101/70  Pulse: (!) 26  (!) 110 (!) 108  Resp: (!) 24 (!) 24 (!) 24 (!) 25  Temp:      TempSrc:      SpO2: 97% 100% 100% 100%  Weight:       General exam: Awake, laying in bed, in nad Respiratory system: Normal respiratory effort, no wheezing Cardiovascular system: regular rate, s1, s2 Gastrointestinal system: Soft, nondistended, positive BS Central nervous system: CN2-12 grossly intact, strength intact Extremities: Perfused, no clubbing Skin: Normal skin turgor, no notable skin lesions seen Psychiatry: Mood normal // no visual hallucinations   Data Reviewed:  Labs reviewed: Na 134, K 4.1, Cr 7.31  Family Communication: Pt in room, family not at bedside  Disposition: Status is: Inpatient Remains inpatient appropriate because: Severity of illness  Planned Discharge Destination: Skilled nursing facility    Author: Rickey Barbara, MD 11/16/2022 5:34 PM  For on call review [email or web address].

## 2022-11-16 NOTE — Progress Notes (Signed)
PT Cancellation Note  Patient Details Name: Justin [name] MRN: [id number] DOB: July 23, 1945   Cancelled Treatment:    Reason Eval/Treat Not Completed: Fatigue/lethargy limiting ability to participate. Attempted x3 to see pt for PT treatment. Pt sleeping hard and opened eyes briefly to max verbal and tactile stimuli, then immediately proceeded to fall back asleep. Did not awaken when therapist repositioned nasal cannula. Attempted again later in AM and third time in PM and pt with continued lethargy and unable to sustain arousal long enough to participate. Will attempt again when pt is more alert.   Marlana Salvage Zaunegger Blima Rich PT, DPT 11/16/2022, 1:27 PM

## 2022-11-16 NOTE — Plan of Care (Signed)
  Problem: Education: Goal: Ability to describe self-care measures that may prevent or decrease complications (Diabetes Survival Skills Education) will improve Outcome: Progressing   Problem: Coping: Goal: Ability to adjust to condition or change in health will improve Outcome: Progressing   Problem: Fluid Volume: Goal: Ability to maintain a balanced intake and output will improve Outcome: Progressing   Problem: Health Behavior/Discharge Planning: Goal: Ability to manage health-related needs will improve Outcome: Progressing   Problem: Metabolic: Goal: Ability to maintain appropriate glucose levels will improve Outcome: Progressing   Problem: Nutritional: Goal: Maintenance of adequate nutrition will improve Outcome: Progressing   Problem: Skin Integrity: Goal: Risk for impaired skin integrity will decrease Outcome: Progressing   Problem: Tissue Perfusion: Goal: Adequacy of tissue perfusion will improve Outcome: Progressing   

## 2022-11-16 NOTE — Care Management Important Message (Signed)
Important Message  Patient Details  Name: DEADRICK VERDUGO MRN: [id number] Date of Birth: 17-Oct-1945   Medicare Important Message Given:  Yes     Renie Ora 11/16/2022, 12:13 PM

## 2022-11-16 NOTE — TOC Progression Note (Signed)
Transition of Care [facility]) - Progression Note    Patient Details  Name: Justin [name] MRN: [id number] Date of Birth: [birth year]  Transition of Care [facility]) CM/SW Contact  Eduard Roux, [place] Phone Number: 11/16/2022, 6:07 PM  Clinical Narrative:     CSW unable to start authorization for SNF- will start authorization once patient is able to work with PT. Updated PT note is required for authorization.   Expected Discharge Plan: Skilled Nursing Facility    Expected Discharge Plan and Services                                               Social Determinants of Health (SDOH) Interventions SDOH Screenings   Food Insecurity: No Food Insecurity (09/16/2022)  Housing: Low Risk  (09/16/2022)  Transportation Needs: No Transportation Needs (09/16/2022)  Utilities: Not At Risk (09/16/2022)  Tobacco Use: Medium Risk (11/12/2022)    Readmission Risk Interventions    11/14/2022   10:08 AM 09/18/2022    4:10 PM 06/17/2022    2:47 PM  Readmission Risk Prevention Plan  Transportation Screening Complete Complete Complete  HRI or Home Care Consult   Complete  Social Work Consult for Recovery Care Planning/Counseling   Complete  Palliative Care Screening   Not Applicable  Medication Review [employer]) Complete Complete Complete  PCP or Specialist appointment within 3-5 days of discharge Complete Complete   HRI or Home Care Consult Complete Complete   SW Recovery Care/Counseling Consult Complete    Palliative Care Screening Not Applicable Complete   Skilled Nursing Facility Complete Not Applicable

## 2022-11-17 DIAGNOSIS — J810 Acute pulmonary edema: Secondary | ICD-10-CM | POA: Diagnosis not present

## 2022-11-17 DIAGNOSIS — K922 Gastrointestinal hemorrhage, unspecified: Secondary | ICD-10-CM | POA: Diagnosis not present

## 2022-11-17 LAB — GLUCOSE, CAPILLARY
Glucose-Capillary: 100 mg/dL — ABNORMAL HIGH (ref 70–99)
Glucose-Capillary: 104 mg/dL — ABNORMAL HIGH (ref 70–99)
Glucose-Capillary: 128 mg/dL — ABNORMAL HIGH (ref 70–99)
Glucose-Capillary: 80 mg/dL (ref 70–99)
Glucose-Capillary: 84 mg/dL (ref 70–99)
Glucose-Capillary: 88 mg/dL (ref 70–99)

## 2022-11-17 MED ORDER — TRAZODONE HCL 50 MG PO TABS
50.0000 mg | ORAL_TABLET | Freq: Every evening | ORAL | Status: DC | PRN
  Administered 2022-11-23: 50 mg via ORAL
  Filled 2022-11-17: qty 1

## 2022-11-17 MED ORDER — CHLORHEXIDINE GLUCONATE CLOTH 2 % EX PADS
6.0000 | MEDICATED_PAD | Freq: Every day | CUTANEOUS | Status: DC
  Administered 2022-11-17 – 2022-11-21 (×4): 6 via TOPICAL

## 2022-11-17 NOTE — Progress Notes (Signed)
[facility] Progress Note   Subjective:   Upset this AM, reports his IV came out and he has been calling for RN. Has some blood on his gown but does not appear to be actively bleeding. RN desk informed. He initially refuses exam then states "I don't care."  Objective Vitals:   11/17/22 0422 11/17/22 0500 11/17/22 0600 11/17/22 0750  BP: (!) 99/58 (!) 86/58 (!) 102/59 (!) 103/53  Pulse:    (!) 109  Resp: (!) 22 20 19    Temp: 98.2 F (36.8 C)   97.8 F (36.6 C)  TempSrc:    Oral  SpO2:    96%  Weight:       Physical Exam General: Alert male in NAD Heart: RRR, no murmurs, rubs or gallops Lungs: CTA anteriorly, on O2 via [place] Abdomen: Non-distended Extremities: no edema appreciated Dialysis Access: LUE AVF + bruit  Additional Objective Labs: Basic Metabolic Panel: Recent Labs  Lab 11/13/22 0141 11/14/22 0144 11/16/22 0114  NA 133* 131* 134*  K 3.5 3.6 4.1  CL 94* 95* 97*  CO2 25 26 28   GLUCOSE 109* 119* 113*  BUN 25* 14 25*  CREATININE 6.17* 4.67* 7.31*  CALCIUM 8.4* 7.8* 8.3*   Liver Function Tests: Recent Labs  Lab 11/13/22 0141 11/14/22 0144 11/16/22 0114  AST 23 21 18   ALT 24 22 20   ALKPHOS 72 74 77  BILITOT 0.8 0.8 0.8  PROT 6.6 6.6 7.0  ALBUMIN 1.9* 1.9* 2.0*   No results for input(s): "LIPASE", "AMYLASE" in the last 168 hours. CBC: Recent Labs  Lab 11/11/22 0214 11/12/22 0142 11/13/22 0721 11/14/22 0144  WBC 8.1 8.3 8.0 7.9  NEUTROABS 6.0  --   --   --   HGB 8.6* 8.0* 8.1* 8.1*  HCT 26.3* 25.0* 24.8* 25.3*  MCV 104.8* 103.7* 100.0 103.7*  PLT 174 189 208 204   Blood Culture    Component Value Date/Time   SDES ABSCESS 06/15/2022 1344   SPECREQUEST PERIRECTAL 06/15/2022 1344   CULT  06/15/2022 1344    FEW ESCHERICHIA COLI NO ANAEROBES ISOLATED Performed at [facility], [address], [place], [place] [address]    REPTSTATUS 06/20/2022 FINAL 06/15/2022 1344    Cardiac Enzymes: No results for input(s):  "CKTOTAL", "CKMB", "CKMBINDEX", "TROPONINI" in the last 168 hours. CBG: Recent Labs  Lab 11/16/22 1349 11/16/22 2044 11/17/22 0030 11/17/22 0408 11/17/22 0749  GLUCAP 86 93 84 88 80   Iron Studies: No results for input(s): "IRON", "TIBC", "TRANSFERRIN", "FERRITIN" in the last 72 hours. @lablastinr3 @ Studies/Results: No results found. Medications:  ferric gluconate (FERRLECIT) IVPB 62.5 mg (11/16/22 1736)    apixaban  5 mg Oral BID   calcitRIOL  0.75 mcg Oral Q M,W,F-HD   Chlorhexidine Gluconate Cloth  6 each Topical Q0600   Chlorhexidine Gluconate Cloth  6 each Topical Q0600   Chlorhexidine Gluconate Cloth  6 each Topical Q0600   cinacalcet  30 mg Oral Q supper   insulin aspart  0-9 Units Subcutaneous Q4H   midodrine  10 mg Oral TID WC   pantoprazole  40 mg Oral BID   pregabalin  75 mg Oral QHS   sucroferric oxyhydroxide  500 mg Oral TID WC    Dialysis Orders: [facility] M[facility] 3h  450/600  99kg  2/2.5 bath  LUA AVF  Hep none - last HD 4/26, post wt 111kg, coming off 10+kg over last 2 sessions - rocaltrol 0.75 mcg po tiw - venofer 100mg   tiw thru 5/08 - mircera 200 mcg IV q 2 wks, last 4/26 - last Hb 7.7 on 4/24    Assessment/Plan: LOC episode/ AMS - had brief CPR x 4 min at SNF before EMS arrived. Was somnolent here x 24 hrs then improved the next day. ABG showed pCO2 of 57. AMS has resolved.  PNA - getting IV abx here for suspected PNA. On O2 via [place] for desaturations at night.  SP cholecystotomy - during recent admit at [facility]. Acute upper GIB - melena on initial ED exam, recent EGD showed duodenal polyps. Pt rec'd 1u prbcs and GI did capsule endo showing tiny gastric AVMs vs localized gastritis. No further bleeding. Eliquis resumed. Acute hypoxic resp failure/ vol overload - pt admitted 10kg over dry wt w/ diffuse edema on exam. Got off 7 L w/ 1st two HD sessions and vol excess mostly resolved. BP dropped on HD so total UF was yesterday. Likely approaching EDW.   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 Next HD tomorrow BP - BP's are soft here, low dose home metoprolol is on hold for now. Getting home midodrine 10mg  tid here.  Parox afib - rate currently controlled. Beta blocker on hold due to hypotension.  Anemia esrd - Hb stable in low 8's, close to OP recent labs. Continuing the outpt IV iron load while here. 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 Cont po vdra, sensipar qd and velphoro as binder.  Dispo - pending dc to SNF   Rogers Blocker, PA-C 11/17/2022, 8:27 AM  [facility] Pager: [phone]

## 2022-11-17 NOTE — Progress Notes (Addendum)
Occupational Therapy Treatment Patient Details Name: Justin [name] MRN: [id number] DOB: 11-11-1945 Today's Date: 11/17/2022   History of present illness Pt is a 77 y.o. male admitted 4/28 from SNF with syncope. Pt with recent hospitalization at [facility] in April 2024 for NSTEMI. 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 Pt agreeable to trial AP transfer to chair with OT and PT. Pt required increased time and effort to sit EOB, rotating hips in prep for AP transfer to chair. Pt unable to complete transfer to chair AP technique due to fatigue, however was able to long sit x 8 minutes. Pt and his neice poor historians as far as timelines for multiple hospitalizations, rehab stays and PLOF since January 2024 with both giving conflicting info, however were not correcting each others' account of events leading up to this hospitalization as therapists asked questions/seeking clarification. Pt educated extensively on the importance/benefits of UE strengthening, being OOB daily during acute stay as well as when  he returns to the SNF. OT will continue to follow acutely to maximize level of function and safety   Recommendations for follow up therapy are one component of a multi-disciplinary discharge planning process, led by the attending physician.  Recommendations may be updated based on patient status, additional functional criteria and insurance authorization.    Assistance Recommended at Discharge Frequent or constant Supervision/Assistance  Patient can return home with the following  Two people to help with walking and/or  transfers;A lot of help with bathing/dressing/bathroom;Assistance with cooking/housework;Direct supervision/assist for medications management;Direct supervision/assist for financial management;Assist for transportation;Help with stairs or ramp for entrance   Equipment Recommendations  Other (comment) (TBD at next venue of care)    Recommendations for Other Services      Precautions / Restrictions Precautions Precautions: Fall;Other (comment) Precaution Comments: pressure sores B heels, hypersensitivity/pain B feet Restrictions Weight Bearing Restrictions: No Other Position/Activity Restrictions: pt reports that he is unable to touch floor with feet due to pain       Mobility Bed Mobility Overal bed mobility: Needs Assistance Bed Mobility: Supine to Sit     Supine to sit: Mod assist, +2 for physical assistance Sit to supine: Mod assist, +2 for physical assistance   General bed mobility comments: +rail, increased time, cues for sequencing. pt able to long sit x 8 minutes    Transfers                   General transfer comment: pt required increased time and effort to sit EOB, rotating hips in prep for AP transfer to chair. Pt unable to complete transfer to chair AP technique due to fatigue     Balance Overall balance assessment: Needs assistance Sitting-balance support: Feet supported, Bilateral upper extremity supported Sitting balance-Leahy Scale: Fair Sitting balance - Comments: long sitting in bed x 8 minutes, used B rails x 1 trial, R rail second trial                                   ADL either performed or assessed with clinical judgement   ADL  Grooming: Wash/dry hands;Wash/dry face;Min guard;Sitting           Upper Body Dressing : Minimal assistance;Sitting                     General ADL Comments: pt required increased time and effort to sit EOB, rotating hips in prep for AP transfer to chair. Pt unable to complete  transfer to chair AP technique due to fatigue, however was able to long sit x 8 minutes    Extremity/Trunk Assessment Upper Extremity Assessment Upper Extremity Assessment: Generalized weakness   Lower Extremity Assessment Lower Extremity Assessment: Defer to PT evaluation        Vision Baseline Vision/History: 1 Wears glasses Ability to See in Adequate Light: 0 Adequate Patient Visual Report: No change from baseline     Perception     Praxis      Cognition Arousal/Alertness: Awake/alert Behavior During Therapy: WFL for tasks assessed/performed Overall Cognitive Status: Difficult to assess                                 General Comments: pt and his neice poor historians as far as timelines for hospitalizarions, rehab stays and PLOF since January 2024 with both giving conflicting info, however were not correcting each others' account of events leading up to this hospitalization as therapists asked questions/seeking clarification        Exercises      Shoulder Instructions       General Comments      Pertinent Vitals/ Pain       Pain Assessment Pain Assessment: 0-10 Pain Score: 8  Pain Location: B feet Pain Descriptors / Indicators: Tender, Throbbing, Sore Pain Intervention(s): Limited activity within patient's tolerance, Monitored during session, Repositioned  Home Living                                          Prior Functioning/Environment              Frequency  Min 1X/week        Progress Toward Goals  OT Goals(current goals can now be found in the care plan section)  Progress towards OT goals: OT to reassess next treatment     Plan Discharge plan remains appropriate    Co-evaluation    PT/OT/SLP Co-Evaluation/Treatment: Yes Reason for Co-Treatment: For patient/therapist safety;To address functional/ADL transfers   OT goals addressed during session: ADL's and self-care      AM-PAC OT "6 Clicks" Daily  Activity     Outcome Measure   Help from another person eating meals?: None Help from another person taking care of personal grooming?: A Little Help from another person toileting, which includes using toliet, bedpan, or urinal?: A Lot Help from another person bathing (including washing, rinsing, drying)?: A Lot Help from another person to put on and taking off regular upper body clothing?: A Little Help from another person to put on and taking off regular lower body clothing?: A Lot 6 Click Score: 16    End of Session    OT Visit Diagnosis: Unsteadiness on feet (R26.81);Muscle weakness (generalized) (M62.81);Pain Pain - Right/Left:  (Bilaterally) Pain - part of body: Ankle and joints of foot   Activity Tolerance Patient limited by fatigue   Patient Left in bed;with call bell/phone within reach;with bed alarm  set   Nurse Communication          Time: 1403-1500 OT Time Calculation (min): 57 min  Charges: OT General Charges $OT Visit: 1 Visit OT Treatments $Self Care/Home Management : 8-22 mins $Therapeutic Activity: 8-22 mins    Galen Manila 11/17/2022, 3:50 PM

## 2022-11-17 NOTE — Progress Notes (Signed)
Progress Note   Patient: Justin [name] XBJ:[id number] DOB: [birth year] DOA: 11/08/2022     8 DOS: the patient was seen and examined on 11/17/2022   Brief hospital course: 77 y.o. 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  Patient was recently admitted to [facility] 4/2-4/23 for acute cholecystitis/septic shock and NSTEMI.  He was initially in the ICU requiring vasopressor support.  General surgery had decided against surgical intervention due to patient's history of aortic stenosis.  He was treated with antibiotics and underwent cholecystectomy tube placement by IR.  Ultimately discharged to SNF with cholecystectomy tube in place.  He was requiring supplemental oxygen at the time of discharge.  Per review of discharge summary, had biopsy of stomach/fundus mass done at [facility] and reportedly results inconclusive per pathologist who recommended deeper biopsy samples in the future.  Patient [name] of her syncopal episode.  Upon initial examination patient was found to be hypoxic hemoglobin was 7.4 and melena on rectal examination.  Chest x-ray also showed worsening bilateral airspace disease due to atelectasis or pneumonia. 4/30: Patient's mental status is improved, blood pressure has stabilized with midodrine and anemia/hemoglobin is stable.  Assessment and Plan: Syncope Toxic metabolic encephalopathy -Patient still continues to be very somnolent. - CT of the head is negative for any acute abnormalities - TSH within normal limits, ammonia was unremarkable - EEG ordered and shows no epileptiform activity and signs of generalized  encephalopathy. -ABG shows mild hypercapnia.Will order BIPAP but patient did not want the BiPAP. - Mentation currently much improved, pt conversing appropriately -Remains medically stable at this time for return to SNF, awaiting insurance auth    Acute GI bleed, likely upper Acute on chronic anemia secondary to blood loss Hemoglobin currently 7.4, was ranging between 7.9-10.3 on labs done last month.  He had melena on rectal exam done in the ED and FOBT positive.  ED physician has consulted [facility].  EGD done last month showing normal esophagus, hiatal hernia, single submucosal papule (nodule) found in the stomach, and 2 duodenal polyps.   -received 1 unit PRBC transfusion this visit -Gastroenterologist following. Capsule study demonstrating tiny gatric AVM vs mild localized gastritis vs healing from previous biopsy site without bleeding -no plan for repeat endo while inpt. Recs for outpt EUS and convert protonix to 40mg  po bid. GI signed off -OK to continue eliquis per GI   Hypotension: - Patient has chronic hypotension, blood pressure improved with midodrine.   Pneumonia ruled in Chest x-ray showing worsening bilateral lower lung airspace disease which may be due to atelectasis or pneumonia, possible small left effusion, and mild cardiomegaly.  No fever or leukocytosis.  Last echo done in December 2023 showing EF 60 to 65%, however, he appears volume overloaded on exam with 3+ bilateral lower extremity edema.   -completed course of cefepime and vancomycin. -Attempt to wean O2, however pt observed to desaturate to the mid-80's while asleep on RA. O2 sats improved when awake and pt noted to be alert and oriented -Suspect pt would benefit from nocutrnal O2, currently on 1.5L -recommend sleep study as outpatient   Paroxysmal A-fib/flutter -continue eliquis   ESRD on HD MWF He does appear volume overloaded but  satting well on 3 L nasal cannula.  Potassium and bicarb normal.   -Nephrology  following   QT prolongation Cardiac monitoring.  Continue to correct electrolytes   Type 2 diabetes A1c 6.5 in December 2023.  Sensitive sliding scale insulin every 4 hours.   -Blood sugars remain stable and controlled  Hyponatremia -continue volume management via HD      Subjective: Looking forward to returning to SNF  Physical Exam: Vitals:   11/17/22 0750 11/17/22 0806 11/17/22 1146 11/17/22 1633  BP: (!) 103/53  (!) 88/70 102/65  Pulse: (!) 109  (!) 108   Resp:   20   Temp: 97.8 F (36.6 C)   98.8 F (37.1 C)  TempSrc: Oral   Oral  SpO2: 96% 98% 99% 96%  Weight:       General exam: Asleep, in no acute distress Respiratory system: normal chest rise, clear, no audible wheezing Cardiovascular system: regular rhythm, s1-s2 Gastrointestinal system: Nondistended, nontender, pos BS Central nervous system: No seizures, no tremors Extremities: No cyanosis, no joint deformities Skin: No rashes, no pallor Psychiatry: Affect normal // no auditory hallucinations   Data Reviewed:  There are no new results to review at this time.  Family Communication: Pt in room, family at bedside  Disposition: Status is: Inpatient Remains inpatient appropriate because: Severity of illness  Planned Discharge Destination: Skilled nursing facility    Author: Rickey Barbara, MD 11/17/2022 6:57 PM  For on call review [email or web address].

## 2022-11-17 NOTE — Progress Notes (Addendum)
MEWS Progress Note  Patient Details Name: JAHMAI ALLPORT MRN: [id number] DOB: March 22, 1946 Today's Date: 11/17/2022   MEWS Flowsheet Documentation:  Assess: MEWS Score Temp: 98.2 F (36.8 C) BP: (!) 80/48 MAP (mmHg): (!) 59 Pulse Rate: (!) 112 ECG Heart Rate: (!) 112 Resp: 19 Level of Consciousness: Alert SpO2: 95 % O2 Device: Nasal Cannula Patient Activity (if Appropriate): In bed O2 Flow Rate (L/min): 3 L/min FiO2 (%): (!) 3 % Assess: MEWS Score MEWS Temp: 0 MEWS Systolic: 2 MEWS Pulse: 2 MEWS RR: 0 MEWS LOC: 0 MEWS Score: 4 MEWS Score Color: Red Assess: SIRS CRITERIA SIRS Temperature : 0 SIRS Respirations : 0 SIRS Pulse: 1 SIRS WBC: 0 SIRS Score Sum : 1 SIRS Temperature : 0 SIRS Pulse: 1 SIRS Respirations : 0 SIRS WBC: 0 SIRS Score Sum : 1 Assess: if the MEWS score is Yellow or Red Were vital signs taken at a resting state?: Yes Focused Assessment: No change from prior assessment Does the patient meet 2 or more of the SIRS criteria?: No Does the patient have a confirmed or suspected source of infection?: No MEWS guidelines implemented : Yes, red Treat MEWS Interventions: Considered administering scheduled or prn medications/treatments as ordered Take Vital Signs Increase Vital Sign Frequency : Red: Q1hr x2, continue Q4hrs until patient remains green for 12hrs Escalate MEWS: Escalate: Red: Discuss with charge nurse and notify provider. Consider notifying RRT. If remains red for 2 hours consider need for higher level of care Notify: Charge Nurse/RN Name of Charge Nurse/RN Notified: Dentist Provider Notification Provider Name/Title: Opyd,MD Date Provider Notified: 11/17/22 Time Provider Notified: 0030 Method of Notification: Page Notification Reason: Critical Result Provider response: Other (Comment) (Medication administered) Date of Provider Response: 11/17/22 Time of Provider Response: 0032    0428  Pt Bp continue to fluctuate. Dr. Marcial Pacas Opyd  notified.  Lenord Fellers 11/17/2022, 1:13 AM

## 2022-11-17 NOTE — Progress Notes (Addendum)
Physical Therapy Treatment Patient Details Name: Justin [name] MRN: [id number] DOB: [birth year] Today's Date: 11/17/2022   History of Present Illness Pt is a 77 y.o. male admitted 4/28 from SNF with syncope. Pt with recent hospitalization at [facility] in April 2024 for NSTEMI. 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 Attempted to determine pt's last known ambulation and course of events leading to SNF placement prior to this hospitalization. Pt and niece both poor historians and unable to clearly determine how long it has been since he was walking. Pt reports during a previous SNF stay therapy was working on UE strength. Given pt reported UE strength and decreased tolerance of pressure through feet, proposed A-P transfer to recliner. Pt in agreement and requires modAx2 for pivoting hips in bed and for pad assist for backwards propulsion towards recliner, placed perpendicular to the bed. Pt's ability to make transfer to recliner limited by increased fatigue with scooting. Despite lack of transfer pt able to sit in longsitting for ~ 8 minutes before return to supine. Pt educated on seemingly unrelated tasks of being out of bed and working on UE strengthening are important in getting back to his goal of walking. PT will continue to follow acutely to progress mobility.    Recommendations for follow up therapy are one component of a multi-disciplinary discharge planning process, led by the attending physician.  Recommendations may be updated based on patient status, additional functional criteria and insurance authorization.  Follow Up Recommendations   Can patient physically be transported by private vehicle: No    Assistance Recommended at Discharge Frequent or constant Supervision/Assistance  Patient can return home with the following Two people to help with walking and/or transfers;A lot of help with bathing/dressing/bathroom   Equipment Recommendations  Other (comment);Wheelchair (measurements PT) (sliding board)    Recommendations for Other Services       Precautions / Restrictions Precautions Precautions: Fall;Other (comment) Precaution Comments: pressure sores B heels, hypersensitivity/pain B feet Restrictions Weight Bearing Restrictions: No Other Position/Activity Restrictions: pt reports that he is unable to touch floor with feet due to pain     Mobility  Bed Mobility Overal bed mobility: Needs Assistance Bed Mobility: Supine to Sit     Supine to sit: Mod assist, +2 for physical assistance Sit to supine: Mod assist, +2 for physical assistance   General bed mobility comments: +rail, increased time, cues for sequencing. pt able to long sit x 8 minutes    Transfers                   General transfer comment: pt required increased time and effort to sit EOB, rotating hips in prep for AP transfer to chair. Pt unable to complete transfer to chair AP technique due to fatigue          Balance Overall balance assessment: Needs assistance Sitting-balance support: Feet supported, Bilateral upper extremity supported Sitting balance-Leahy Scale: Fair Sitting balance - Comments: long sitting in bed x 8 minutes, used B rails x 1 trial, R rail second trial  Cognition Arousal/Alertness: Awake/alert Behavior During Therapy: WFL for tasks assessed/performed Overall Cognitive Status: Difficult to assess                                 General Comments: pt and his neice poor historians as far as timelines for hospitalizarions, rehab stays and PLOF since  January 2024 with both giving conflicting info, however were not correcting each others' account of events leading up to this hospitalization as therapists asked questions/seeking clarification           General Comments General comments (skin integrity, edema, etc.): SpO2 100% on 2L O2, pt reports no use of supplementary O2 at baseline however when Doffing removed during session SpO2 dropped to 87%O2 with activity, HR increased to 110s with bed mobility      Pertinent Vitals/Pain Pain Assessment Pain Assessment: 0-10 Pain Score: 8  Pain Location: B feet Pain Descriptors / Indicators: Tender, Throbbing, Sore Pain Intervention(s): Limited activity within patient's tolerance, Monitored during session, Repositioned     PT Goals (current goals can now be found in the care plan section) Acute Rehab PT Goals PT Goal Formulation: With patient Time For Goal Achievement: 11/27/22 Potential to Achieve Goals: Fair Progress towards PT goals: Progressing toward goals    Frequency    Min 1X/week      PT Plan Current plan remains appropriate    Co-evaluation PT/OT/SLP Co-Evaluation/Treatment: Yes Reason for Co-Treatment: For patient/therapist safety;To address functional/ADL transfers PT goals addressed during session: Mobility/safety with mobility OT goals addressed during session: ADL's and self-care      AM-PAC PT "6 Clicks" Mobility   Outcome Measure  Help needed turning from your back to your side while in a flat bed without using bedrails?: A Lot Help needed moving from lying on your back to sitting on the side of a flat bed without using bedrails?: A Lot Help needed moving to and from a bed to a chair (including a wheelchair)?: Total Help needed standing up from a chair using your arms (e.g., wheelchair or bedside chair)?: Total Help needed to walk in hospital room?: Total Help needed climbing 3-5 steps with a railing? : Total 6 Click Score: 8    End of Session Equipment  Utilized During Treatment: Oxygen Activity Tolerance: Patient tolerated treatment well Patient left: in bed;with call bell/phone within reach Nurse Communication: Mobility status PT Visit Diagnosis: Difficulty in walking, not elsewhere classified (R26.2);Pain;Other abnormalities of gait and mobility (R26.89) Pain - Right/Left:  (bilateral) Pain - part of body: Ankle and joints of foot     Time: 1403-1500 PT Time Calculation (min) (ACUTE ONLY): 57 min  Charges:  $Therapeutic Activity: 23-37 mins                     Alonna Bartling B. Beverely Risen PT, DPT Acute Rehabilitation Services Please use secure chat or  Call Office [phone]    Elon Alas [facility] 11/17/2022, 4:30 PM

## 2022-11-18 DIAGNOSIS — R55 Syncope and collapse: Secondary | ICD-10-CM | POA: Diagnosis not present

## 2022-11-18 LAB — COMPREHENSIVE METABOLIC PANEL
ALT: 18 U/L (ref 0–44)
AST: 18 U/L (ref 15–41)
Albumin: 1.9 g/dL — ABNORMAL LOW (ref 3.5–5.0)
Alkaline Phosphatase: 76 U/L (ref 38–126)
Anion gap: 8 (ref 5–15)
BUN: 22 mg/dL (ref 8–23)
CO2: 29 mmol/L (ref 22–32)
Calcium: 8.4 mg/dL — ABNORMAL LOW (ref 8.9–10.3)
Chloride: 97 mmol/L — ABNORMAL LOW (ref 98–111)
Creatinine, Ser: 6.14 mg/dL — ABNORMAL HIGH (ref 0.61–1.24)
GFR, Estimated: 9 mL/min — ABNORMAL LOW (ref >60)
Glucose, Bld: 96 mg/dL (ref 70–99)
Potassium: 3.8 mmol/L (ref 3.5–5.1)
Sodium: 134 mmol/L — ABNORMAL LOW (ref 135–145)
Total Bilirubin: 0.8 mg/dL (ref 0.3–1.2)
Total Protein: 6.8 g/dL (ref 6.5–8.1)

## 2022-11-18 LAB — GLUCOSE, CAPILLARY
Glucose-Capillary: 88 mg/dL (ref 70–99)
Glucose-Capillary: 90 mg/dL (ref 70–99)
Glucose-Capillary: 76 mg/dL (ref 70–99)
Glucose-Capillary: 131 mg/dL — ABNORMAL HIGH (ref 70–99)
Glucose-Capillary: 99 mg/dL (ref 70–99)

## 2022-11-18 LAB — CBC
HCT: 25.6 % — ABNORMAL LOW (ref 39.0–52.0)
Hemoglobin: 8 g/dL — ABNORMAL LOW (ref 13.0–17.0)
MCH: 31.5 pg (ref 26.0–34.0)
MCHC: 31.3 g/dL (ref 30.0–36.0)
MCV: 100.8 fL — ABNORMAL HIGH (ref 80.0–100.0)
Platelets: 284 10*3/uL (ref 150–400)
RBC: 2.54 MIL/uL — ABNORMAL LOW (ref 4.22–5.81)
RDW: 16.9 % — ABNORMAL HIGH (ref 11.5–15.5)
WBC: 7.9 10*3/uL (ref 4.0–10.5)
nRBC: 0 % (ref 0.0–0.2)

## 2022-11-18 MED ORDER — ANTICOAGULANT SODIUM CITRATE 4% (200MG/5ML) IV SOLN
5.0000 mL | Status: DC | PRN
  Filled 2022-11-18: qty 5

## 2022-11-18 MED ORDER — INSULIN ASPART 100 UNIT/ML IJ SOLN
0.0000 [IU] | Freq: Three times a day (TID) | INTRAMUSCULAR | Status: DC
  Administered 2022-11-22: 1 [IU] via SUBCUTANEOUS

## 2022-11-18 MED ORDER — HEPARIN SODIUM (PORCINE) 1000 UNIT/ML DIALYSIS: 1000.0000 [IU] | INTRAMUSCULAR | Status: DC | PRN

## 2022-11-18 MED ORDER — POLYETHYLENE GLYCOL 3350 17 G PO PACK
17.0000 g | PACK | Freq: Once | ORAL | Status: DC
  Filled 2022-11-18: qty 1

## 2022-11-18 MED ORDER — ALTEPLASE 2 MG IJ SOLR: 2.0000 mg | Freq: Once | INTRAMUSCULAR | Status: DC | PRN

## 2022-11-18 MED ORDER — ACETAMINOPHEN 325 MG PO TABS: 650.0000 mg | ORAL_TABLET | Freq: Once | ORAL | Status: DC

## 2022-11-18 MED ORDER — LIDOCAINE HCL (PF) 1 % IJ SOLN: 5.0000 mL | INTRAMUSCULAR | Status: DC | PRN

## 2022-11-18 MED ORDER — PENTAFLUOROPROP-TETRAFLUOROETH EX AERO: 1.0000 | INHALATION_SPRAY | CUTANEOUS | Status: DC | PRN

## 2022-11-18 MED ORDER — LIDOCAINE-PRILOCAINE 2.5-2.5 % EX CREA: 1.0000 | TOPICAL_CREAM | CUTANEOUS | Status: DC | PRN

## 2022-11-18 NOTE — Progress Notes (Signed)
Called to get nursing report for hemodialysis was told to call back nurse not available

## 2022-11-18 NOTE — Progress Notes (Signed)
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  He was recently admitted to [facility] 4/2-4/23 for acute cholecystitis/septic shock and NSTEMI.  He was initially in the ICU requiring vasopressor support.  General surgery had decided against surgical intervention due to patient's history of aortic stenosis.  He was treated with antibiotics and underwent cholecystectomy tube placement by IR.  Ultimately discharged to SNF with cholecystectomy tube in place.  He was requiring supplemental oxygen at the time of discharge.  Per review of discharge summary, had biopsy of stomach/fundus mass done at [facility] and reportedly results inconclusive per pathologist who recommended deeper biopsy samples in the future.   Patient had syncopal episode at [facility] found to be hypoxic hemoglobin was 7.4 and melena on rectal examination. CXR- worsening bilateral airspace disease due to atelectasis or pneumonia. Patient remained somnolent x 24 hours then improved next day ABG showed pCO2 47 Patient treated for pneumonia, acute upper GI bleeding status post EGD, 1 unit PRBC found to have duodenal polyps, underwent capsule endoscopy showing tiny gastric AVMs versus localized gastritis no further bleeding and Eliquis resumed. Seen by nephrology continued HD MWF.  BP has been soft and meds being adjusted. At this  time complete workup, he is waiting for placement. Per Child psychotherapist patient must be able to tolerate sitting for outpatient HD before he can return to SNF    Subjective: Seen and examined this morning in dialysis is alert awake oriented communicative follows all commands. Weakness in lower extremities Reports he has not walked in a while Fistula on the left arm Right abdominal drain in place  Assessment and Plan: Principal Problem:   Syncope Active Problems:   Acute on chronic blood loss anemia   ESRD on hemodialysis (HCC)   DM (diabetes mellitus) (HCC)   Hypotension   Acute GI bleeding   Acute encephalopathy   Acute blood loss anemia   Melena   History of colonic polyps   ESRD (end stage renal disease) on dialysis (HCC)   Gastric nodule   Acute toxic metabolic encephalopathy Syncope: CompleteD workup with CT head, TSH, EEG, ABG overall unremarkable besides mild hypercapnia.  Mentation improved 24 hours after admission.  Continue delirium precaution provide PT OT and Pending SNF.  He is fairly Musician.   Acute GI bleed, likely upper Acute on chronic anemia secondary to blood loss: Baseline hemoglobin 7.9-10.3 g, had melena on rectal exam FOBT positive underwent GI evaluation- EGD done last month showing normal esophagus, hiatal hernia, single submucosal papule (nodule) found in the stomach, and 2 duodenal polyps. S/P Capsule STUDY:tiny gatric AVM vs mild localized gastritis vs healing from previous biopsy site without bleeding Status post 1 PRBC, no further GI workup, continue PPI. Recs for outpt EUS Recent Labs    09/11/22 0616 09/12/22 0444 11/11/22 0214 11/12/22 0142 11/13/22 0721 11/14/22 0144 11/18/22 0107  HGB 8.4*   < > 8.6* 8.0* 8.1* 8.1* 8.0*  MCV 105.6*   < > 104.8* 103.7*  100.0 103.7* 100.8*  VITAMINB12 848  --   --   --   --   --   --   FOLATE 25.6  --   --   --   --   --   --   FERRITIN 1,126*  --   --   --   --   --   --   TIBC 200*  --    --   --   --   --   --   IRON 54  --   --   --   --   --   --   RETICCTPCT 3.8*  --   --   --   --   --   --    < > = values in this interval not displayed.     Hypotension chronic: Continue midodrine.  Pneumonia:cxr-worsening bilateral lower lung airspace disease which may be due to atelectasis or pneumonia, possible small left effusion, and mild cardiomegaly.  No fever or leukocytosis.  Completed antibiotics, unable to wean off oxygen desaturated to mid 80s while asleep on room air continue supplemental oxygen need sleep study as outpatient.  Recent admission at [facility] 4/2-4/23 for acute cholecystitis/septic shock and NSTEMI.  He was initially in the ICU requiring vasopressor support.  General surgery had decided against surgical intervention due to patient's history of aortic stenosis.  He was treated with antibiotics and underwent cholecystectomy tube placement by IR. Cont drain care  PAF on Eliquis ESRD and HD nephrology managing.  On HD today Type 2 diabetes mellitus stable A1c 6.5 change SSI achs Recent Labs  Lab 11/17/22 1146 11/17/22 1620 11/17/22 2000 11/18/22 0107 11/18/22 0458  GLUCAP 100* 128* 104* 88 90    QT prolongation monitor Hyponatremia in the setting of ESRD monitor  Class I Obesity:Patient's Body mass index is 32.3 kg/m. : Will benefit with PCP follow-up, weight loss  healthy lifestyle and outpatient sleep evaluation.   DVT prophylaxis: SCDs Start: 11/09/22 0136 Code Status:   Code Status: Full Code Family Communication: plan of care discussed with patient/RN at bedside. Patient status is: Inpatient because of awaiting skilled nursing facility Level of care: Progressive  Dispo: The patient is from: snf            Anticipated disposition: snf  Objective: Vitals last 24 hrs: Vitals:   11/18/22 0930 11/18/22 1015 11/18/22 1030 11/18/22 1100  BP: 115/74 (!) 89/52 (!) 85/61 99/69  Pulse: (!) 110 (!) 111 (!) 112 (!) 111  Resp: 18 (!) 22 (!) 26 (!) 30   Temp:      TempSrc:      SpO2: 100% 96% 93% 100%  Weight:       Weight change:   Physical Examination: General exam: alert awake, older than stated age HEENT:Oral mucosa moist, Ear/Nose WNL grossly Respiratory system: bilaterally clear BS, no use of accessory muscle Cardiovascular system: S1 & S2 +, No JVD. Gastrointestinal system: Abdomen soft,NT,ND, BS+ Nervous System:Alert, awake, moving extremities. Extremities: LE edema mild,distal peripheral pulses palpable.  Skin: No rashes,no icterus. MSK: Normal muscle bulk,tone, power  Medications reviewed:  Scheduled Meds:  acetaminophen  650 mg Oral Once   apixaban  5 mg Oral BID   calcitRIOL  0.75 mcg Oral Q M,W,F-HD   Chlorhexidine Gluconate Cloth  6 each Topical Q0600   Chlorhexidine Gluconate Cloth  6 each Topical Q0600   Chlorhexidine Gluconate Cloth  6 each Topical Q0600   Chlorhexidine Gluconate Cloth  6  each Topical Q0600   cinacalcet  30 mg Oral Q supper   insulin aspart  0-9 Units Subcutaneous Q4H   midodrine  10 mg Oral TID WC   pantoprazole  40 mg Oral BID   polyethylene glycol  17 g Oral Once   pregabalin  75 mg Oral QHS   sucroferric oxyhydroxide  500 mg Oral TID WC   Continuous Infusions:  anticoagulant sodium citrate     ferric gluconate (FERRLECIT) IVPB 62.5 mg (11/18/22 1017)   Diet Order             Diet heart healthy/carb modified Room service appropriate? No; Fluid consistency: Thin  Diet effective now                   Intake/Output Summary (Last 24 hours) at 11/18/2022 1107 Last data filed at 11/17/2022 1500 Gross per 24 hour  Intake 240 ml  Output 150 ml  Net 90 ml   Net IO Since Admission: -4,236.46 mL [11/18/22 1107]  Wt Readings from Last 3 Encounters:  11/18/22 102.1 kg  09/25/22 96 kg  09/22/22 96.6 kg     Unresulted Labs (From admission, onward)     Start     Ordered   11/18/22 0824  CBC  Once,   R       Question:  Specimen collection method  Answer:  Lab=Lab collect    11/18/22 0824   Signed and Held  Renal function panel  Once,   R       Question:  Specimen collection method  Answer:  Lab=Lab collect   Signed and Held          Data Reviewed: I have personally reviewed following labs and imaging studies CBC: Recent Labs  Lab 11/12/22 0142 11/13/22 0721 11/14/22 0144 11/18/22 0107  WBC 8.3 8.0 7.9 7.9  HGB 8.0* 8.1* 8.1* 8.0*  HCT 25.0* 24.8* 25.3* 25.6*  MCV 103.7* 100.0 103.7* 100.8*  PLT 189 208 204 284   Basic Metabolic Panel: Recent Labs  Lab 11/12/22 0142 11/13/22 0141 11/14/22 0144 11/16/22 0114 11/18/22 0107  NA 133* 133* 131* 134* 134*  K 3.6 3.5 3.6 4.1 3.8  CL 97* 94* 95* 97* 97*  CO2 27 25 26 28 29   GLUCOSE 99 109* 119* 113* 96  BUN 23 25* 14 25* 22  CREATININE 5.13* 6.17* 4.67* 7.31* 6.14*  CALCIUM 8.0* 8.4* 7.8* 8.3* 8.4*   GFR: Estimated Creatinine Clearance: 11.9 mL/min (A) (by C-G formula based on SCr of 6.14 mg/dL (H)). Liver Function Tests: Recent Labs  Lab 11/12/22 0142 11/13/22 0141 11/14/22 0144 11/16/22 0114 11/18/22 0107  AST 25 23 21 18 18   ALT 25 24 22 20 18   ALKPHOS 65 72 74 77 76  BILITOT 1.0 0.8 0.8 0.8 0.8  PROT 6.3* 6.6 6.6 7.0 6.8  ALBUMIN 1.9* 1.9* 1.9* 2.0* 1.9*  CBG: Recent Labs  Lab 11/17/22 1146 11/17/22 1620 11/17/22 2000 11/18/22 0107 11/18/22 0458  GLUCAP 100* 128* 104* 88 90   Recent Results (from the past 240 hour(s))  SARS Coronavirus 2 by RT PCR (hospital order, performed in [facility] hospital lab) *cepheid single result test* Anterior Nasal Swab     Status: None   Collection Time: 11/09/22  4:55 AM   Specimen: Anterior Nasal Swab  Result Value Ref Range Status   SARS Coronavirus 2 by RT PCR NEGATIVE NEGATIVE Final    Comment: Performed at [facility], [address],  [place], [place] [address]   Antimicrobials: Anti-infectives (From admission, onward)    Start     Dose/Rate Route Frequency Ordered Stop   11/11/22 1200  vancomycin (VANCOCIN) IVPB 1000  mg/200 mL premix        1,000 mg 200 mL/hr over 60 Minutes Intravenous Every M-W-F (Hemodialysis) 11/09/22 1523 11/13/22 1429   11/09/22 1615  vancomycin (VANCOREADY) IVPB 2000 mg/400 mL        2,000 mg 200 mL/hr over 120 Minutes Intravenous  Once 11/09/22 1521 11/10/22 0212   11/09/22 1600  ceFEPIme (MAXIPIME) 1 g in sodium chloride 0.9 % 100 mL IVPB        1 g 200 mL/hr over 30 Minutes Intravenous Every 24 hours 11/09/22 1459 11/13/22 2122   11/09/22 1545  linezolid (ZYVOX) IVPB 600 mg  Status:  Discontinued        600 mg 300 mL/hr over 60 Minutes Intravenous Every 12 hours 11/09/22 1500 11/09/22 1519     Culture/Microbiology    Component Value Date/Time   SDES ABSCESS 06/15/2022 1344   SPECREQUEST PERIRECTAL 06/15/2022 1344   CULT  06/15/2022 1344    FEW ESCHERICHIA COLI NO ANAEROBES ISOLATED Performed at [facility], [address], [place], [place] [address]    REPTSTATUS 06/20/2022 FINAL 06/15/2022 1344  Radiology Studies: No results found.   LOS: 9 days   Lanae Boast, MD [facility]  11/18/2022, 11:07 AM

## 2022-11-18 NOTE — Progress Notes (Addendum)
Physical Therapy Treatment Patient Details Name: Justin [name] MRN: [id number] DOB: Oct 12, 1945 Today's Date: 11/18/2022   History of Present Illness Pt is a 77 y.o. male admitted 4/28 from SNF with syncope. Pt with recent hospitalization at [facility] in April 2024 for NSTEMI. 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    PT Comments    Pt received in supine, agreeable to therapy session and with good participation and fair tolerance for bed mobility, supine BLE ROM/exercises for strengthening and transfer OOB to chair. PTA utilized hoyer lift as pt c/o fatigue after return from HD dept and reports lately he has used hoyer lift at SNF due to B foot pressure sores. Pt agreeable to sit up in chair at end of session to build tolerance for OP HD sessions, RN notified. MD notified of pt bil heel pain and may benefit from heel offloading Darco shoes if available for comfort with scoot/standing transfers if pain allows next session. Pt continues to benefit from PT services to progress toward functional mobility goals.  Addendum 1735: per RN, pt tolerated sitting up in recliner chair 2 hours prior to requesting to return to bed, MD notified.  Recommendations for follow up therapy are one component of a multi-disciplinary discharge planning process, led by the attending physician.  Recommendations may be updated based on patient status, additional functional criteria and insurance authorization.  Follow Up Recommendations  Can patient physically be transported by private vehicle: No    Assistance Recommended at Discharge Frequent or constant Supervision/Assistance  Patient can return home with the following  Two people to help with walking and/or transfers;A lot of help with bathing/dressing/bathroom   Equipment Recommendations  Other (comment);Wheelchair (measurements PT) (sliding board)    Recommendations for Other Services       Precautions / Restrictions Precautions Precautions: Fall;Other (comment) Precaution Comments: pressure sores B heels, hypersensitivity/pain B feet Restrictions Weight Bearing Restrictions: No Other Position/Activity Restrictions: pt reports that he is unable to touch floor with feet due to pain     Mobility  Bed Mobility Overal bed mobility: Needs Assistance Bed Mobility: Supine to Sit, Rolling Rolling: Min assist, +2 for safety/equipment   Supine to sit: Mod assist, +2 for safety/equipment, HOB elevated     General bed mobility comments: +rail, increased time, cues for sequencing. pt able to long sit briefly x2 while repositioning in bed from elevated HOB, but emphasis of session on transfer OOB via hoyer so limited unsupported sitting this date. Pt has visitors arriving once up in chair so defers further seated exercises/activities.    Transfers Overall transfer level: Needs assistance Equipment used:  Michiel Sites) Transfers: Bed to chair/wheelchair/BSC             General transfer comment: Pt defers scoot transfer due to fatigue after HD but agreeable to maxi-move OOB to chair to build tolerance for sitting for HD Transfer via Lift Equipment: Maximove  Ambulation/Gait               General Gait Details: pt reports standing too painful with bil heel wounds   Stairs             Wheelchair Mobility    Modified Rankin (Stroke Patients Only)       Balance Overall  balance assessment: Needs assistance Sitting-balance support: Feet supported, Bilateral upper extremity supported Sitting balance-Leahy Scale: Fair Sitting balance - Comments: long sitting in bed x 1-2 mins x2 trials used B rails x 1 trial, R rail second trial        Standing balance comment: Pt unable to stand due to BLE pain, MD notified in case heel offloading should would assist with this                            Cognition Arousal/Alertness: Awake/alert Behavior During Therapy: WFL for tasks assessed/performed Overall Cognitive Status: Difficult to assess                                 General Comments: pt poor historian as far as timelines for rehab stays and PLOF. Pt states he has been using hoyer for "about 3 weeks" and before that was "walking and driving and stuff" however per chart review he was shospitalized in April and has wounds on bil heels so likely has been longer since pt was ambulatory. Pt easily frustrated but following commands with encouragement.        Exercises Other Exercises Other Exercises: supine BLE AROM: ankle pumps, quad sets, SLR x5-10 reps ea Other Exercises: pulling to long sit x3 reps, only able to maintain ~1-2 mins at a time    General Comments General comments (skin integrity, edema, etc.): SpO2 WFL on 1L O2 [place], [place] removed briefly to assess on RA however noisy signal and at times reading <89%, so 1L O2  replaced at end of session.      Pertinent Vitals/Pain Pain Assessment Pain Assessment: Faces Faces Pain Scale: Hurts even more Pain Location: B feet, unable to tolerate socks over his wound dressings/feet Pain Descriptors / Indicators: Tender, Throbbing, Sore, Grimacing, Moaning Pain Intervention(s): Limited activity within patient's tolerance, Monitored during session, Repositioned, Patient requesting pain meds-RN notified     PT Goals (current goals can now be found in the care plan section) Acute Rehab PT Goals Patient Stated Goal: to walk PT Goal Formulation: With patient Time For Goal Achievement: 11/27/22 Progress towards PT goals: Progressing toward goals    Frequency    Min 1X/week      PT Plan Current plan remains appropriate       AM-PAC PT "6  Clicks" Mobility   Outcome Measure  Help needed turning from your back to your side while in a flat bed without using bedrails?: A Lot Help needed moving from lying on your back to sitting on the side of a flat bed without using bedrails?: A Lot Help needed moving to and from a bed to a chair (including a wheelchair)?: Total Help needed standing up from a chair using your arms (e.g., wheelchair or bedside chair)?: Total Help needed to walk in hospital room?: Total Help needed climbing 3-5 steps with a railing? : Total 6 Click Score: 8    End of Session Equipment Utilized During Treatment: Oxygen;Other (comment) (mechanical lift) Activity Tolerance: Patient limited by fatigue;Patient limited by pain Patient left: in bed;with call bell/phone within reach Nurse Communication: Mobility status PT Visit Diagnosis: Difficulty in walking, not elsewhere classified (R26.2);Pain;Other abnormalities of gait and mobility (R26.89) Pain - Right/Left:  (bilateral) Pain - part of body: Ankle and joints of foot     Time: 1610-9604 PT Time Calculation (min) (ACUTE ONLY): 23 min  Charges:  $Therapeutic Activity: 23-37 mins                     Xian Alves P., PTA Acute Rehabilitation Services Secure Chat Preferred 9a-5:30pm Office: [phone]    Dorathy Kinsman [facility] 11/18/2022, 5:26 PM

## 2022-11-18 NOTE — Progress Notes (Signed)
PT Cancellation Note  Patient Details Name: SAAMIR SELINSKY MRN: [id number] DOB: 03/25/1946   Cancelled Treatment:    Reason Eval/Treat Not Completed: (P) Patient at procedure or test/unavailable (pt off unit at HD dept.) Will continue efforts in afternoon (after 3pm when +2 assist available) as schedule permits per PT plan of care.   Dorathy Kinsman Janee Ureste 11/18/2022, 12:04 PM

## 2022-11-18 NOTE — Progress Notes (Signed)
[facility] Progress Note   Subjective:   No concerns this AM. Denies SOB, CP, dizziness, nausea.   Objective Vitals:   11/17/22 1633 11/17/22 2154 11/17/22 2300 11/18/22 0700  BP: 102/65 130/60 (!) 137/107 101/63  Pulse:  (!) 109  (!) 110  Resp:  19 19 20   Temp: 98.8 F (37.1 C) 97.9 F (36.6 C)  97.9 F (36.6 C)  TempSrc: Oral Oral  Oral  SpO2: 96% 100%    Weight:       Physical Exam General: Alert male in NAD Heart: RRR, no murmurs, rubs or gallops Lungs: CTA anteriorly, on O2 via [place] Abdomen: Non-distended Extremities: no edema appreciated Dialysis Access: LUE AVF + bruit  Additional Objective Labs: Basic Metabolic Panel: Recent Labs  Lab 11/14/22 0144 11/16/22 0114 11/18/22 0107  NA 131* 134* 134*  K 3.6 4.1 3.8  CL 95* 97* 97*  CO2 26 28 29   GLUCOSE 119* 113* 96  BUN 14 25* 22  CREATININE 4.67* 7.31* 6.14*  CALCIUM 7.8* 8.3* 8.4*   Liver Function Tests: Recent Labs  Lab 11/14/22 0144 11/16/22 0114 11/18/22 0107  AST 21 18 18   ALT 22 20 18   ALKPHOS 74 77 76  BILITOT 0.8 0.8 0.8  PROT 6.6 7.0 6.8  ALBUMIN 1.9* 2.0* 1.9*   No results for input(s): "LIPASE", "AMYLASE" in the last 168 hours. CBC: Recent Labs  Lab 11/12/22 0142 11/13/22 0721 11/14/22 0144 11/18/22 0107  WBC 8.3 8.0 7.9 7.9  HGB 8.0* 8.1* 8.1* 8.0*  HCT 25.0* 24.8* 25.3* 25.6*  MCV 103.7* 100.0 103.7* 100.8*  PLT 189 208 204 284   Blood Culture    Component Value Date/Time   SDES ABSCESS 06/15/2022 1344   SPECREQUEST PERIRECTAL 06/15/2022 1344   CULT  06/15/2022 1344    FEW ESCHERICHIA COLI NO ANAEROBES ISOLATED Performed at [facility], [address] [place], [place] [address]    REPTSTATUS 06/20/2022 FINAL 06/15/2022 1344    Cardiac Enzymes: No results for input(s): "CKTOTAL", "CKMB", "CKMBINDEX", "TROPONINI" in the last 168 hours. CBG: Recent Labs  Lab 11/17/22 1146 11/17/22 1620 11/17/22 2000 11/18/22 0107 11/18/22 0458  GLUCAP  100* 128* 104* 88 90   Iron Studies: No results for input(s): "IRON", "TIBC", "TRANSFERRIN", "FERRITIN" in the last 72 hours. @lablastinr3 @ Studies/Results: No results found. Medications:  ferric gluconate (FERRLECIT) IVPB 62.5 mg (11/16/22 1736)    apixaban  5 mg Oral BID   calcitRIOL  0.75 mcg Oral Q M,W,F-HD   Chlorhexidine Gluconate Cloth  6 each Topical Q0600   Chlorhexidine Gluconate Cloth  6 each Topical Q0600   Chlorhexidine Gluconate Cloth  6 each Topical Q0600   Chlorhexidine Gluconate Cloth  6 each Topical Q0600   cinacalcet  30 mg Oral Q supper   insulin aspart  0-9 Units Subcutaneous Q4H   midodrine  10 mg Oral TID WC   pantoprazole  40 mg Oral BID   pregabalin  75 mg Oral QHS   sucroferric oxyhydroxide  500 mg Oral TID WC    OP Dialysis Orders: [facility] MWF 3h  450/600  99kg  2/2.5 bath  LUA AVF  Hep none - last HD 4/26, post wt 111kg, coming off 10+kg over last 2 sessions - rocaltrol 0.75 mcg po tiw - venofer 100mg  tiw thru 5/08 - mircera 200 mcg IV q 2 wks, last 4/26 - last Hb 7.7 on 4/24  Assessment/Plan: LOC episode/ AMS - had brief CPR x 4 min at  SNF before EMS arrived. Was somnolent here x 24 hrs then improved the next day. ABG showed pCO2 of 57. AMS has resolved.  PNA - getting IV abx here for suspected PNA. On O2 via [place] for desaturations at night.  SP cholecystotomy - during recent admit at [facility]. Acute upper GIB - melena on initial ED exam, recent EGD showed duodenal polyps. Pt rec'd 1u prbcs and GI did capsule endo showing tiny gastric AVMs vs localized gastritis. No further bleeding. Eliquis resumed. Acute hypoxic resp failure/ vol overload - pt admitted 10kg over dry wt w/ diffuse edema on exam. Got off 7 L w/ 1st two HD sessions and vol excess mostly resolved. BP dropped on HD so total UF was yesterday. Likely approaching EDW.  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 Next HD tomorrow BP - BP's are soft here, low dose home metoprolol is on hold for now. Getting  home midodrine 10mg  tid here.  Parox afib - rate currently controlled. Beta blocker on hold due to hypotension.  Anemia esrd - Hb stable in low 8's, close to OP recent labs. Continuing the outpt IV iron load while here. 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 Cont po vdra, sensipar qd and velphoro as binder.  Dispo - pending dc to SNF     Rogers Blocker, PA-C 11/18/2022, 7:20 AM  [facility] Pager: [phone]

## 2022-11-18 NOTE — Progress Notes (Signed)
Orthopedic Tech Progress Note Patient Details:  Justin [name] 07-Mar-1946 [id number]  Called in order to [employer] for a pair of OFFLOADING [name] SHOES   Patient ID: Justin [name], male   DOB: [birth year], 77 y.o.   MRN: [id number]  [name] 11/18/2022, 6:09 PM

## 2022-11-18 NOTE — Progress Notes (Signed)
Received patient in bed to unit.  Alert and oriented.  Informed consent signed and in chart.   TX duration:3.5  Patient tolerated well.  Transported back to the room  Alert, without acute distress.  Hand-off given to patient's nurse.   Access used: left AVF Access issues: NONE  Total UF removed: 1.5L Medication(s) given: FERRIC GLUCONATE, CALCITRIOL   11/18/22 1129  Vitals  Temp 97.8 F (36.6 C)  Temp Source Oral  BP (!) 98/55  MAP (mmHg) 69  BP Location Right Arm  BP Method Automatic  Patient Position (if appropriate) Lying  Pulse Rate (!) 113  Pulse Rate Source Monitor  ECG Heart Rate (!) 112  Resp (!) 22  Oxygen Therapy  SpO2 94 %  O2 Device Nasal Cannula  O2 Flow Rate (L/min) 1 L/min  During Treatment Monitoring  Intra-Hemodialysis Comments Tx completed  Dialysis Fluid Bolus Normal Saline  Bolus Amount (mL) 300 mL      Nayellie Sanseverino S [facility]

## 2022-11-18 NOTE — TOC Progression Note (Signed)
Transition of Care [facility]) - Progression Note    Patient Details  Name: Justin [name] MRN: [id number] Date of Birth: 04-23-1946  Transition of Care [facility]) CM/SW Contact  Eduard Roux, [place] Phone Number: 11/18/2022, 10:07 AM  Clinical Narrative:     CSW continues to follow- patient must be able to tolerating sitting for OP HD before he can return to SNF.  Antony Blackbird, MSW, LCSW Clinical Social Worker    Expected Discharge Plan: Skilled Nursing Facility Barriers to Discharge: Continued Medical Work up (must be able to tolerate sitting for OP HD Clinic before returning to SNF)  Expected Discharge Plan and Services                                               Social Determinants of Health (SDOH) Interventions SDOH Screenings   Food Insecurity: No Food Insecurity (09/16/2022)  Housing: Low Risk  (09/16/2022)  Transportation Needs: No Transportation Needs (09/16/2022)  Utilities: Not At Risk (09/16/2022)  Tobacco Use: Medium Risk (11/12/2022)    Readmission Risk Interventions    11/14/2022   10:08 AM 09/18/2022    4:10 PM 06/17/2022    2:47 PM  Readmission Risk Prevention Plan  Transportation Screening Complete Complete Complete  HRI or Home Care Consult   Complete  Social Work Consult for Recovery Care Planning/Counseling   Complete  Palliative Care Screening   Not Applicable  Medication Review Ocean[employer]) Complete Complete Complete  PCP or Specialist appointment within 3-5 days of discharge Complete Complete   HRI or Home Care Consult Complete Complete   SW Recovery Care/Counseling Consult Complete    Palliative Care Screening Not Applicable Complete   Skilled Nursing Facility Complete Not Applicable

## 2022-11-19 DIAGNOSIS — R55 Syncope and collapse: Secondary | ICD-10-CM | POA: Diagnosis not present

## 2022-11-19 LAB — CBC
HCT: 26.9 % — ABNORMAL LOW (ref 39.0–52.0)
Hemoglobin: 8.1 g/dL — ABNORMAL LOW (ref 13.0–17.0)
MCH: 30.6 pg (ref 26.0–34.0)
MCHC: 30.1 g/dL (ref 30.0–36.0)
MCV: 101.5 fL — ABNORMAL HIGH (ref 80.0–100.0)
Platelets: 290 10*3/uL (ref 150–400)
RBC: 2.65 MIL/uL — ABNORMAL LOW (ref 4.22–5.81)
RDW: 16.8 % — ABNORMAL HIGH (ref 11.5–15.5)
WBC: 7.5 10*3/uL (ref 4.0–10.5)
nRBC: 0 % (ref 0.0–0.2)

## 2022-11-19 LAB — BASIC METABOLIC PANEL
Anion gap: 6 (ref 5–15)
BUN: 14 mg/dL (ref 8–23)
CO2: 30 mmol/L (ref 22–32)
Calcium: 8.3 mg/dL — ABNORMAL LOW (ref 8.9–10.3)
Chloride: 98 mmol/L (ref 98–111)
Creatinine, Ser: 4.48 mg/dL — ABNORMAL HIGH (ref 0.61–1.24)
GFR, Estimated: 13 mL/min — ABNORMAL LOW (ref >60)
Glucose, Bld: 87 mg/dL (ref 70–99)
Potassium: 3.8 mmol/L (ref 3.5–5.1)
Sodium: 134 mmol/L — ABNORMAL LOW (ref 135–145)

## 2022-11-19 LAB — GLUCOSE, CAPILLARY
Glucose-Capillary: 102 mg/dL — ABNORMAL HIGH (ref 70–99)
Glucose-Capillary: 117 mg/dL — ABNORMAL HIGH (ref 70–99)
Glucose-Capillary: 119 mg/dL — ABNORMAL HIGH (ref 70–99)
Glucose-Capillary: 88 mg/dL (ref 70–99)
Glucose-Capillary: 96 mg/dL (ref 70–99)

## 2022-11-19 MED ORDER — CHLORHEXIDINE GLUCONATE CLOTH 2 % EX PADS
6.0000 | MEDICATED_PAD | Freq: Every day | CUTANEOUS | Status: DC
  Administered 2022-11-19 – 2022-11-24 (×4): 6 via TOPICAL

## 2022-11-19 NOTE — Progress Notes (Signed)
Occupational Therapy Treatment Patient Details Name: Justin [name] MRN: [id number] DOB: Apr 02, 1946 Today's Date: 11/19/2022   History of present illness Pt is a 77 y.o. male admitted 4/28 from SNF with syncope. Pt with recent hospitalization at [facility] in April 2024 for NSTEMI. 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   OT comments  Pt making progress with functional goals. Pt agreeable to work with OT. Pt seed to be feeling better and requires decreased assist to sit EOB, pt able to scoot hips with no physical assist to EOB and lateral scoot to rail/HOB. Pt sat EOB ~ 20 minutes for UB dressing, grooming/hygiene, simulated LB bathing tasks. OT will continue to follow acutely to maximize level of function and safety   Recommendations for follow up therapy are one component of a multi-disciplinary discharge planning process, led by the attending physician.  Recommendations may be updated based on patient status, additional functional criteria and insurance authorization.    Assistance Recommended at Discharge Frequent or constant Supervision/Assistance  Patient can return home with the following  Two people to help with walking and/or transfers;A lot of help with bathing/dressing/bathroom;Assistance with cooking/housework;Direct supervision/assist for medications management;Direct supervision/assist for financial management;Assist for transportation;Help with stairs or ramp for entrance   Equipment Recommendations  Other (comment) (TBD at SNF)    Recommendations for Other Services      Precautions / Restrictions Precautions Precautions: Fall;Other (comment) Precaution Comments: pressure sores  B heels, hypersensitivity/pain B feet Restrictions Weight Bearing Restrictions: No Other Position/Activity Restrictions: pt reports that he is unable to touch floor with feet due to pain. Pt now has B Darco Shoes       Mobility Bed Mobility Overal bed mobility: Needs Assistance       Supine to sit: Mod assist, HOB elevated Sit to supine: Max assist   General bed mobility comments: mod A to elevate trunk, max A with LEs back onto bed. Pt able to scoot hips forward to EOB and to lateral scoot toward bedrail/HOB    Transfers Overall transfer level: Needs assistance   Transfers: Bed to chair/wheelchair/BSC             General transfer comment: deferred, will need +2 assist next session to trial with Darco shoes     Balance Overall balance assessment: Needs assistance Sitting-balance support: Feet supported, Bilateral upper extremity supported Sitting balance-Leahy Scale: Fair Sitting balance - Comments: sat EOB x ~ 20 minutes       Standing balance comment: deferred, trial next session now that pt had B darco shoes                           ADL either performed or assessed with clinical judgement   ADL Overall ADL's : Needs assistance/impaired     Grooming: Wash/dry hands;Wash/dry face;Sitting;Supervision/safety;Set up       Lower Body Bathing: Maximal assistance;Sitting/lateral leans Lower Body Bathing Details (indicate cue type and reason): simulated Upper Body Dressing : Min guard;Sitting                     General ADL Comments: OT showed pt Darco shoes and pt requetsed to try them on. Pt pleased with fit/feel and wore shoes  through session while seated EOB    Extremity/Trunk Assessment Upper Extremity Assessment Upper Extremity Assessment: Generalized weakness   Lower Extremity Assessment Lower Extremity Assessment: Defer to PT evaluation   Cervical / Trunk Assessment Cervical / Trunk Assessment: Normal    Vision Baseline  Vision/History: 1 Wears glasses Ability to See in Adequate Light: 0 Adequate Patient Visual Report: No change from baseline     Perception     Praxis      Cognition Arousal/Alertness: Awake/alert Behavior During Therapy: WFL for tasks assessed/performed Overall Cognitive Status: Difficult to assess                                          Exercises      Shoulder Instructions       General Comments      Pertinent Vitals/ Pain       Pain Assessment Pain Assessment: No/denies pain Faces Pain Scale: Hurts little more Pain Location: B feet Pain Descriptors / Indicators: Tender, Sore Pain Intervention(s): Limited activity within patient's tolerance, Monitored during session, Repositioned  Home Living                                          Prior Functioning/Environment              Frequency  Min 1X/week        Progress Toward Goals  OT Goals(current goals can now be found in the care plan section)  Progress towards OT goals: Progressing toward goals     Plan Discharge plan remains appropriate;Frequency remains appropriate    Co-evaluation                 AM-PAC OT "6 Clicks" Daily Activity     Outcome Measure   Help from another person eating meals?: None Help from another person taking care of personal grooming?: A Little Help from another person toileting, which includes using toliet, bedpan, or urinal?: A Lot Help from another person bathing (including washing, rinsing, drying)?: A Lot Help from another person to put on and taking off regular upper body clothing?: A Little Help from another person to put on and taking off regular lower body clothing?: A Lot 6 Click Score: 16    End of Session    OT Visit Diagnosis: Unsteadiness on feet (R26.81);Muscle weakness (generalized) (M62.81);Pain Pain - Right/Left:  (bilaterally) Pain - part of body: Ankle and joints of foot   Activity Tolerance Patient  limited by fatigue   Patient Left in bed;with call bell/phone within reach;with bed alarm set   Nurse Communication          Time: 1610-9604 OT Time Calculation (min): 32 min  Charges: OT General Charges $OT Visit: 1 Visit OT Treatments $Self Care/Home Management : 8-22 mins $Therapeutic Activity: 8-22 mins   Galen Manila 11/19/2022, 2:24 PM

## 2022-11-19 NOTE — Progress Notes (Signed)
[facility] Progress Note   Subjective:   No complaints this AM. Denies SOB, CP, palpitations, dizziness and nausea. No issues reported during HD yesterday. Discussed trying HD in recliner tomorrow and he agrees, says he has been sitting in chair in his room without issues.   Objective Vitals:   11/18/22 1901 11/18/22 2303 11/19/22 0330 11/19/22 0729  BP: 93/76 103/69 94/64 105/72  Pulse: (!) 113 (!) 110 (!) 110 (!) 111  Resp: 18 20 17 18   Temp: 97.9 F (36.6 C) (!) 97.4 F (36.3 C) 99.2 F (37.3 C) 97.9 F (36.6 C)  TempSrc:  Oral Axillary Oral  SpO2: 100% 99% 100% 99%  Weight:       Physical Exam General: Alert male in NAD Heart: RRR, no murmurs, rubs or gallops Lungs: CTA anteriorly, on O2 via [place] Abdomen: Non-distended Extremities: no edema appreciated Dialysis Access: LUE AVF + bruit    Additional Objective Labs: Basic Metabolic Panel: Recent Labs  Lab 11/16/22 0114 11/18/22 0107 11/19/22 0146  NA 134* 134* 134*  K 4.1 3.8 3.8  CL 97* 97* 98  CO2 28 29 30   GLUCOSE 113* 96 87  BUN 25* 22 14  CREATININE 7.31* 6.14* 4.48*  CALCIUM 8.3* 8.4* 8.3*   Liver Function Tests: Recent Labs  Lab 11/14/22 0144 11/16/22 0114 11/18/22 0107  AST 21 18 18   ALT 22 20 18   ALKPHOS 74 77 76  BILITOT 0.8 0.8 0.8  PROT 6.6 7.0 6.8  ALBUMIN 1.9* 2.0* 1.9*   No results for input(s): "LIPASE", "AMYLASE" in the last 168 hours. CBC: Recent Labs  Lab 11/13/22 0721 11/14/22 0144 11/18/22 0107 11/19/22 0146  WBC 8.0 7.9 7.9 7.5  HGB 8.1* 8.1* 8.0* 8.1*  HCT 24.8* 25.3* 25.6* 26.9*  MCV 100.0 103.7* 100.8* 101.5*  PLT 208 204 284 290   Blood Culture    Component Value Date/Time   SDES ABSCESS 06/15/2022 1344   SPECREQUEST PERIRECTAL 06/15/2022 1344   CULT  06/15/2022 1344    FEW ESCHERICHIA COLI NO ANAEROBES ISOLATED Performed at [facility], [address] [place], [place] [address]    REPTSTATUS 06/20/2022 FINAL 06/15/2022 1344     Cardiac Enzymes: No results for input(s): "CKTOTAL", "CKMB", "CKMBINDEX", "TROPONINI" in the last 168 hours. CBG: Recent Labs  Lab 11/18/22 1237 11/18/22 1621 11/18/22 2035 11/19/22 0004 11/19/22 0403  GLUCAP 76 131* 99 96 88   Iron Studies: No results for input(s): "IRON", "TIBC", "TRANSFERRIN", "FERRITIN" in the last 72 hours. @lablastinr3 @ Studies/Results: No results found. Medications:   acetaminophen  650 mg Oral Once   apixaban  5 mg Oral BID   calcitRIOL  0.75 mcg Oral Q M,W,F-HD   Chlorhexidine Gluconate Cloth  6 each Topical Q0600   Chlorhexidine Gluconate Cloth  6 each Topical Q0600   Chlorhexidine Gluconate Cloth  6 each Topical Q0600   Chlorhexidine Gluconate Cloth  6 each Topical Q0600   cinacalcet  30 mg Oral Q supper   insulin aspart  0-9 Units Subcutaneous TID WC   midodrine  10 mg Oral TID WC   pantoprazole  40 mg Oral BID   polyethylene glycol  17 g Oral Once   pregabalin  75 mg Oral QHS   sucroferric oxyhydroxide  500 mg Oral TID WC    OP Dialysis Orders: [facility] M[facility] 3h  450/600  99kg  2/2.5 bath  LUA AVF  Hep none - last HD 4/26, post wt 111kg, coming off 10+kg over  last 2 sessions - rocaltrol 0.75 mcg po tiw - venofer 100mg  tiw thru 5/08 - mircera 200 mcg IV q 2 wks, last 4/26 - last Hb 7.7 on 4/24  Assessment/Plan: LOC episode/ AMS - had brief CPR x 4 min at SNF before EMS arrived. Was somnolent here x 24 hrs then improved the next day. ABG showed pCO2 of 57. AMS has resolved.  PNA - getting IV abx here for suspected PNA. On O2 via [place] for desaturations at night. Completed antibiotics Acute upper GIB - melena on initial ED exam, recent EGD showed duodenal polyps. Pt rec'd 1u prbcs and GI did capsule endo showing tiny gastric AVMs vs localized gastritis. No further bleeding. Eliquis resumed. Acute hypoxic resp failure/ vol overload - pt admitted 10kg over dry wt w/ diffuse edema on exam. Got off 7 L w/ 1st two HD sessions and vol excess  mostly resolved. Now having some hypotension with HD but getting closer to EDW ESRD - on HD MWF. Next HD tomorrow, will be done in recliner to make sure he can tolerate sitting for 4 hours BP - BP's are soft here, low dose home metoprolol is on hold for now. Getting home midodrine 10mg  tid here.  Parox afib - rate currently controlled. Beta blocker on hold due to hypotension.  Anemia esrd - Hb stable in low 8's, close to OP recent labs. Continuing the outpt IV iron load while here. ESA due tomorrow, ordered aranesp MBD ckd - CCa in range, phos elevated. Cont po vdra, sensipar qd and velphoro as binder.  Dispo - pending dc to SNF     Rogers Blocker, PA-C 11/19/2022, 10:01 AM  [facility] Pager: [phone]

## 2022-11-19 NOTE — Progress Notes (Signed)
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  He was recently admitted to [facility] 4/2-4/23 for acute cholecystitis/septic shock and NSTEMI.  He was initially in the ICU requiring vasopressor support.  General surgery had decided against surgical intervention due to patient's history of aortic stenosis.  He was treated with antibiotics and underwent cholecystectomy tube placement by IR.  Ultimately discharged to SNF with cholecystectomy tube in place.  He was requiring supplemental oxygen at the time of discharge.  Per review of discharge summary, had biopsy of stomach/fundus mass done at [facility] and reportedly results inconclusive per pathologist who recommended deeper biopsy samples in the future.   Patient had syncopal episode at [facility] found to be hypoxic hemoglobin was 7.4 and melena on rectal examination. CXR- worsening bilateral airspace disease due to atelectasis or pneumonia. Patient remained somnolent x 24 hours then improved next day ABG showed pCO2 47 Patient treated for pneumonia, acute upper GI bleeding status post EGD, 1 unit PRBC found to have duodenal polyps, underwent capsule endoscopy showing tiny gastric AVMs versus localized gastritis no further bleeding and Eliquis resumed. Seen by nephrology continued HD MWF.  BP has been soft and meds being adjusted. At this  time complete workup, he is waiting for placement. Per Child psychotherapist patient must be able to tolerate sitting for outpatient HD before he can return to SNF    Subjective: Seen and examined this morning  No new complaints alert awake oriented, able to move his lower extremities while but weakness in the left lower extremity from neuropathy for several months he says.   Reports he is used to take  2 of 75 mg pill of Lyrica  Assessment and Plan: Principal Problem:   Syncope Active Problems:   Acute on chronic blood loss anemia   ESRD on hemodialysis (HCC)   DM (diabetes mellitus) (HCC)   Hypotension   Acute GI bleeding   Acute encephalopathy   Acute blood loss anemia   Melena   History of colonic polyps   ESRD (end stage renal disease) on dialysis (HCC)   Gastric nodule   Acute toxic metabolic encephalopathy Syncope: Completed workup with CT head, TSH, EEG, ABG overall unremarkable besides mild hypercapnia.  Mentation improved 24 hours after admission.  Continue delirium precaution provide PT OT and Pending SNF.  He is AAOx3.   Acute GI bleed, likely upper Acute on chronic anemia secondary to blood loss: Baseline hemoglobin 7.9-10.3 g, had melena on rectal exam FOBT positive underwent GI evaluation- EGD done last month showing normal esophagus, hiatal hernia, single submucosal papule (nodule) found in the stomach, and 2 duodenal polyps. S/P Capsule STUDY:tiny gatric AVM vs mild localized gastritis vs healing from previous biopsy site without bleeding S/P1 PRBC, no further GI workup>continue PPI. Recs for outpt EUS Recent Labs    09/11/22 0616 09/12/22 0444 11/12/22 0142 11/13/22 0721 11/14/22 0144 11/18/22 0107 11/19/22 0146  HGB 8.4*   < > 8.0* 8.1* 8.1* 8.0* 8.1*  MCV  105.6*   < > 103.7* 100.0 103.7* 100.8* 101.5*  VITAMINB12 848  --   --   --   --   --   --   FOLATE 25.6  --   --   --   --   --   --   FERRITIN 1,126*  --   --   --   --   --   --   TIBC 200*  --   --   --    --   --   --   IRON 54  --   --   --   --   --   --   RETICCTPCT 3.8*  --   --   --   --   --   --    < > = values in this interval not displayed.      Hypotension chronic: Continue midodrine. PAF on Eliquis Sinus tachycardia: Heart rate in the 110s this morning> if persistent can consider decreasing midodrine   Pneumonia:cxr-worsening bilateral lower lung airspace disease which may be due to atelectasis or pneumonia, possible small left effusion, and mild cardiomegaly.  No fever or leukocytosis.  Completed antibiotics, unable to wean off oxygen desaturated to mid 80s while asleep on room air continue supplemental oxygen need sleep study as outpatient.  Recent admission at [facility] 4/2-4/23 for acute cholecystitis/septic shock and NSTEMI.  He was initially in the ICU requiring vasopressor support.  General surgery had decided against surgical intervention due to patient's history of aortic stenosis.  He was treated with antibiotics and underwent cholecystectomy tube placement by IR. Cont drain care  ESRD and HD nephrology managing.  Last HD 5/8 Type 2 diabetes mellitus stable A1c 6.5 change SSI achs Recent Labs  Lab 11/18/22 1237 11/18/22 1621 11/18/22 2035 11/19/22 0004 11/19/22 0403  GLUCAP 76 131* 99 96 88    Peripheral neuropathy: Reports taking Lyrica 75 mg 2 pills a day currently taking 75 mg once a day in the setting of delirium, will consider resuming home dose, reports she has not been able to work since December previously used to walk walker and crutches, has left leg weakness  Deconditioning debility in the setting of multiple medical issues dialysis dependent with peripheral neuropathy: Continue PT OT. Having foot pain>ordered Darco boot 5/8> PT OT working>needs to stay in HD chair-prior to discharge  QT prolongation monitor Hyponatremia in the setting of ESRD monitor  Class I Obesity:Patient's Body mass index is 31.89 kg/m. : Will benefit with PCP follow-up, weight  loss  healthy lifestyle and outpatient sleep evaluation.   DVT prophylaxis: SCDs Start: 11/09/22 0136 Code Status:   Code Status: Full Code Family Communication: plan of care discussed with patient/RN at bedside. Patient status is: Inpatient because of awaiting skilled nursing facility Level of care: Progressive  Dispo: The patient is from: snf            Anticipated disposition: snf  Objective: Vitals last 24 hrs: Vitals:   11/18/22 1901 11/18/22 2303 11/19/22 0330 11/19/22 0729  BP: 93/76 103/69 94/64 105/72  Pulse: (!) 113 (!) 110 (!) 110 (!) 111  Resp: 18 20 17 18   Temp: 97.9 F (36.6 C) (!) 97.4 F (36.3 C) 99.2 F (37.3 C) 97.9 F (36.6 C)  TempSrc:  Oral Axillary Oral  SpO2: 100% 99% 100% 99%  Weight:       Weight change:   Physical Examination: General exam: AAox3, weak,older appearing HEENT:Oral mucosa moist, Ear/Nose WNL  grossly, dentition normal. Respiratory system: bilaterally clear BS,no use of accessory muscle Cardiovascular system: S1 & S2 +, regular rate,. Gastrointestinal system: Abdomen soft,  rt abd w/ drain + NT,ND,BS+ Nervous System:Alert, awake, moving extremities, weakness of left lower extremity-able to move bilateral legs well Extremities: LE ankle edema neg, lower extremities warm Skin:No rashes,no icterus. ZOX:WRUEAV muscle bulk,tone, power   Medications reviewed:  Scheduled Meds:  acetaminophen  650 mg Oral Once   apixaban  5 mg Oral BID   calcitRIOL  0.75 mcg Oral Q M,W,F-HD   Chlorhexidine Gluconate Cloth  6 each Topical Q0600   Chlorhexidine Gluconate Cloth  6 each Topical Q0600   Chlorhexidine Gluconate Cloth  6 each Topical Q0600   Chlorhexidine Gluconate Cloth  6 each Topical Q0600   cinacalcet  30 mg Oral Q supper   insulin aspart  0-9 Units Subcutaneous TID WC   midodrine  10 mg Oral TID WC   pantoprazole  40 mg Oral BID   polyethylene glycol  17 g Oral Once   pregabalin  75 mg Oral QHS   sucroferric oxyhydroxide  500 mg Oral  TID WC   Continuous Infusions:   Diet Order             Diet heart healthy/carb modified Room service appropriate? No; Fluid consistency: Thin  Diet effective now                   Intake/Output Summary (Last 24 hours) at 11/19/2022 0952 Last data filed at 11/18/2022 1900 Gross per 24 hour  Intake --  Output 1650 ml  Net -1650 ml    Net IO Since Admission: -5,886.46 mL [11/19/22 0952]  Wt Readings from Last 3 Encounters:  11/18/22 100.8 kg  09/25/22 96 kg  09/22/22 96.6 kg     Unresulted Labs (From admission, onward)     Start     Ordered   Signed and Held  Renal function panel  Once,   R       Question:  Specimen collection method  Answer:  Lab=Lab collect   Signed and Held          Data Reviewed: I have personally reviewed following labs and imaging studies CBC: Recent Labs  Lab 11/13/22 0721 11/14/22 0144 11/18/22 0107 11/19/22 0146  WBC 8.0 7.9 7.9 7.5  HGB 8.1* 8.1* 8.0* 8.1*  HCT 24.8* 25.3* 25.6* 26.9*  MCV 100.0 103.7* 100.8* 101.5*  PLT 208 204 284 290    Basic Metabolic Panel: Recent Labs  Lab 11/13/22 0141 11/14/22 0144 11/16/22 0114 11/18/22 0107 11/19/22 0146  NA 133* 131* 134* 134* 134*  K 3.5 3.6 4.1 3.8 3.8  CL 94* 95* 97* 97* 98  CO2 25 26 28 29 30   GLUCOSE 109* 119* 113* 96 87  BUN 25* 14 25* 22 14  CREATININE 6.17* 4.67* 7.31* 6.14* 4.48*  CALCIUM 8.4* 7.8* 8.3* 8.4* 8.3*    GFR: Estimated Creatinine Clearance: 16.2 mL/min (A) (by C-G formula based on SCr of 4.48 mg/dL (H)). Liver Function Tests: Recent Labs  Lab 11/13/22 0141 11/14/22 0144 11/16/22 0114 11/18/22 0107  AST 23 21 18 18   ALT 24 22 20 18   ALKPHOS 72 74 77 76  BILITOT 0.8 0.8 0.8 0.8  PROT 6.6 6.6 7.0 6.8  ALBUMIN 1.9* 1.9* 2.0* 1.9*   CBG: Recent Labs  Lab 11/18/22 1237 11/18/22 1621 11/18/22 2035 11/19/22 0004 11/19/22 0403  GLUCAP 76 131* 99 96 88    No  results found for this or any previous visit (from the past 240 hour(s)).   Antimicrobials: Anti-infectives (From admission, onward)    Start     Dose/Rate Route Frequency Ordered Stop   11/11/22 1200  vancomycin (VANCOCIN) IVPB 1000 mg/200 mL premix        1,000 mg 200 mL/hr over 60 Minutes Intravenous Every M-W-F (Hemodialysis) 11/09/22 1523 11/13/22 1429   11/09/22 1615  vancomycin (VANCOREADY) IVPB 2000 mg/400 mL        2,000 mg 200 mL/hr over 120 Minutes Intravenous  Once 11/09/22 1521 11/10/22 0212   11/09/22 1600  ceFEPIme (MAXIPIME) 1 g in sodium chloride 0.9 % 100 mL IVPB        1 g 200 mL/hr over 30 Minutes Intravenous Every 24 hours 11/09/22 1459 11/13/22 2122   11/09/22 1545  linezolid (ZYVOX) IVPB 600 mg  Status:  Discontinued        600 mg 300 mL/hr over 60 Minutes Intravenous Every 12 hours 11/09/22 1500 11/09/22 1519     Culture/Microbiology    Component Value Date/Time   SDES ABSCESS 06/15/2022 1344   SPECREQUEST PERIRECTAL 06/15/2022 1344   CULT  06/15/2022 1344    FEW ESCHERICHIA COLI NO ANAEROBES ISOLATED Performed at [facility], [address] [place], [place] [address]    REPTSTATUS 06/20/2022 FINAL 06/15/2022 1344  Radiology Studies: No results found.   LOS: 10 days   Lanae Boast, MD [facility]  11/19/2022, 9:52 AM

## 2022-11-20 DIAGNOSIS — R55 Syncope and collapse: Secondary | ICD-10-CM | POA: Diagnosis not present

## 2022-11-20 LAB — GLUCOSE, CAPILLARY
Glucose-Capillary: 83 mg/dL (ref 70–99)
Glucose-Capillary: 77 mg/dL (ref 70–99)
Glucose-Capillary: 96 mg/dL (ref 70–99)
Glucose-Capillary: 120 mg/dL — ABNORMAL HIGH (ref 70–99)

## 2022-11-20 LAB — RENAL FUNCTION PANEL
Albumin: 2 g/dL — ABNORMAL LOW (ref 3.5–5.0)
Anion gap: 8 (ref 5–15)
BUN: 19 mg/dL (ref 8–23)
CO2: 28 mmol/L (ref 22–32)
Calcium: 8.6 mg/dL — ABNORMAL LOW (ref 8.9–10.3)
Chloride: 99 mmol/L (ref 98–111)
Creatinine, Ser: 6.21 mg/dL — ABNORMAL HIGH (ref 0.61–1.24)
GFR, Estimated: 9 mL/min — ABNORMAL LOW (ref >60)
Glucose, Bld: 80 mg/dL (ref 70–99)
Phosphorus: 3.2 mg/dL (ref 2.5–4.6)
Potassium: 4.1 mmol/L (ref 3.5–5.1)
Sodium: 135 mmol/L (ref 135–145)

## 2022-11-20 LAB — CBC
HCT: 26.7 % — ABNORMAL LOW (ref 39.0–52.0)
Hemoglobin: 8 g/dL — ABNORMAL LOW (ref 13.0–17.0)
MCH: 30.8 pg (ref 26.0–34.0)
MCHC: 30 g/dL (ref 30.0–36.0)
MCV: 102.7 fL — ABNORMAL HIGH (ref 80.0–100.0)
Platelets: 289 10*3/uL (ref 150–400)
RBC: 2.6 MIL/uL — ABNORMAL LOW (ref 4.22–5.81)
RDW: 16.7 % — ABNORMAL HIGH (ref 11.5–15.5)
WBC: 7.5 10*3/uL (ref 4.0–10.5)
nRBC: 0 % (ref 0.0–0.2)

## 2022-11-20 MED ORDER — HEPARIN SODIUM (PORCINE) 1000 UNIT/ML DIALYSIS: 1000.0000 [IU] | INTRAMUSCULAR | Status: DC | PRN

## 2022-11-20 MED ORDER — LIDOCAINE-PRILOCAINE 2.5-2.5 % EX CREA: 1.0000 | TOPICAL_CREAM | CUTANEOUS | Status: DC | PRN

## 2022-11-20 MED ORDER — PENTAFLUOROPROP-TETRAFLUOROETH EX AERO: 1.0000 | INHALATION_SPRAY | CUTANEOUS | Status: DC | PRN

## 2022-11-20 MED ORDER — LIDOCAINE HCL (PF) 1 % IJ SOLN: 5.0000 mL | INTRAMUSCULAR | Status: DC | PRN

## 2022-11-20 MED ORDER — ANTICOAGULANT SODIUM CITRATE 4% (200MG/5ML) IV SOLN: 5.0000 mL | Status: DC | PRN

## 2022-11-20 MED ORDER — ALTEPLASE 2 MG IJ SOLR: 2.0000 mg | Freq: Once | INTRAMUSCULAR | Status: DC | PRN

## 2022-11-20 NOTE — Progress Notes (Addendum)
Pt required +2 assists turning in the bed. Informed MD about pt's strength. Planning for sitting on the chair using hoyer lift when pt coming back from HD later today.   Lawson Radar, RN

## 2022-11-20 NOTE — Progress Notes (Signed)
Pt back from rm 23 from HD. VSS. Call bell within reach.   Lawson Radar, RN

## 2022-11-20 NOTE — Progress Notes (Signed)
   11/20/22 1353  Vitals  Pulse Rate (!) 112  Resp 17  BP 125/82  SpO2 99 %  O2 Device Nasal Cannula  Weight  (unable to obtain)  Type of Weight Post-Dialysis  Oxygen Therapy  O2 Flow Rate (L/min) 2 L/min  Pulse Oximetry Type Continuous  Oximetry Probe Site Changed No  Patient Activity (if Appropriate) In bed  Post Treatment  Dialyzer Clearance Lightly streaked  Duration of HD Treatment -hour(s) 3.5 hour(s)  Hemodialysis Intake (mL) 0 mL  Liters Processed 76.5  Fluid Removed (mL) 2000 mL  Tolerated HD Treatment Yes  AVG/AVF Arterial Site Held (minutes) 10 minutes  AVG/AVF Venous Site Held (minutes) 10 minutes   Received patient in bed to unit.  Alert and oriented.  Informed consent signed and in chart.   TX duration:3.5  Patient tolerated well.  Transported back to the room  Alert, without acute distress.  Hand-off given to patient's nurse.   Access used: LUAF Access issues: no complications  Total UF removed: 2000 Medication(s) given: none   [facility]

## 2022-11-20 NOTE — Progress Notes (Signed)
Found pt has advance pressure ulcer stage 1 to pressure ulcer stage 2 and increased many spots bilaterally on pt's buttocks. Wound care consulted ordered. Will continue the care.   Lawson Radar, RN

## 2022-11-20 NOTE — Progress Notes (Signed)
Notified CSW and pt's RN that pt did not receive HD in the chair today. Renal PA agreeable to pt sitting in the recliner in pt's room to see how long he can tolerate. RN to attempt later today.   Olivia Canter Renal Navigator [phone]

## 2022-11-20 NOTE — Progress Notes (Addendum)
RN offered help pt transferring to the chair, explained the benefit of testing how long pt tolerated sitting on the chair for d/c plan as outpt HD. Pt got angry and refused sitting on the chair. Stated that sitting on the chair hurting his bottom.  Lawson Radar, RN

## 2022-11-20 NOTE — Consult Note (Signed)
WOC Nurse Consult Note: Reason for Consult: Stage 2 Pressure Injury Wound type: Stage 2 Pressure Injury Pressure Injury POA: Yes Measurement: see nursing flow sheet Wound WJX:BJYNW and pink Drainage (amount, consistency, odor) no drainage  Periwound:intact  Dressing procedure/placement/frequency: Continue silicone foam per the nursing skin care order set. Change every 3 days and PRN soilage.   Discussed POC with patient and bedside nurse.  Re consult if needed, will not follow at this time. Thanks  Truda [employer]ub [employer], RN,CWOCN, CNS, CWON-AP [phone])

## 2022-11-20 NOTE — Progress Notes (Signed)
[facility] Progress Note   Subjective:  Seen on HD - 2L UFG and tolerating. Denies dyspnea, coughing a little.  Objective Vitals:   11/20/22 1030 11/20/22 1100 11/20/22 1129 11/20/22 1200  BP: (!) 96/43 (!) 128/93 109/61 110/86  Pulse: (!) 110 75 (!) 112 (!) 111  Resp: 19 18 18 17   Temp:      TempSrc:      SpO2: 100% (!) 0% 100% 100%  Weight:       Physical Exam General: Chronically ill appearing man, NAD. Nasal O2 in place. Heart: RRR; 2/6 murmur Lungs: Scattered rhonchi throughout Abdomen: soft Extremities: no LE edema; R foot in brace Dialysis Access:  LUE AVF + bruit  Additional Objective Labs: Basic Metabolic Panel: Recent Labs  Lab 11/18/22 0107 11/19/22 0146 11/20/22 0808  NA 134* 134* 135  K 3.8 3.8 4.1  CL 97* 98 99  CO2 29 30 28   GLUCOSE 96 87 80  BUN 22 14 19   CREATININE 6.14* 4.48* 6.21*  CALCIUM 8.4* 8.3* 8.6*  PHOS  --   --  3.2   Liver Function Tests: Recent Labs  Lab 11/14/22 0144 11/16/22 0114 11/18/22 0107 11/20/22 0808  AST 21 18 18   --   ALT 22 20 18   --   ALKPHOS 74 77 76  --   BILITOT 0.8 0.8 0.8  --   PROT 6.6 7.0 6.8  --   ALBUMIN 1.9* 2.0* 1.9* 2.0*   CBC: Recent Labs  Lab 11/14/22 0144 11/18/22 0107 11/19/22 0146 11/20/22 0808  WBC 7.9 7.9 7.5 7.5  HGB 8.1* 8.0* 8.1* 8.0*  HCT 25.3* 25.6* 26.9* 26.7*  MCV 103.7* 100.8* 101.5* 102.7*  PLT 204 284 290 289   Medications:  anticoagulant sodium citrate      acetaminophen  650 mg Oral Once   apixaban  5 mg Oral BID   calcitRIOL  0.75 mcg Oral Q M,W,F-HD   Chlorhexidine Gluconate Cloth  6 each Topical Q0600   Chlorhexidine Gluconate Cloth  6 each Topical Q0600   Chlorhexidine Gluconate Cloth  6 each Topical Q0600   Chlorhexidine Gluconate Cloth  6 each Topical Q0600   Chlorhexidine Gluconate Cloth  6 each Topical Q0600   cinacalcet  30 mg Oral Q supper   insulin aspart  0-9 Units Subcutaneous TID WC   midodrine  10 mg Oral TID WC   pantoprazole  40  mg Oral BID   polyethylene glycol  17 g Oral Once   pregabalin  75 mg Oral QHS   sucroferric oxyhydroxide  500 mg Oral TID WC    Dialysis Orders: [facility] MWF 3h  450/600  99kg  2/2.5 bath  LUA AVF  Hep none - last HD 4/26, post wt 111kg, coming off 10+kg over last 2 sessions - rocaltrol 0.75 mcg po tiw - venofer 100mg  tiw thru 5/08 - mircera 200 mcg IV q 2 wks, last 4/26 - last Hb 7.7 on 4/24   Assessment/Plan: LOC episode/ AMS - had brief CPR x 4 min at SNF before EMS arrived. Was somnolent here x 24 hrs then improved the next day. ABG showed pCO2 of 57. AMS has resolved.  PNA - getting IV abx here for suspected PNA. On O2 via [place] for desaturations at night. Completed antibiotics Acute upper GIB - melena on initial ED exam, recent EGD showed duodenal polyps. Pt rec'd 1u prbcs and GI did capsule endo showing tiny gastric AVMs vs localized gastritis. No further bleeding. Eliquis  resumed. Acute hypoxic resp failure/ vol overload - pt admitted 10kg over dry wt w/ diffuse edema on exam. Got off  L w/ 1st two HD sessions and vol excess mostly resolved. Now having some hypotension with HD but getting closer to EDW ESRD - on HD MWF. Next HD now - was supposed to be in recliner, but not done. Will ask that he sit in recliner in his room. BP - BP's are soft here, low dose home metoprolol is on hold for now. Getting home midodrine 10mg  tid here.  Parox afib - rate currently controlled. Beta blocker on hold due to hypotension.  Anemia esrd - Hb stable in low 8's, close to OP recent labs. Continuing the outpt IV iron load while here. ESA will be given today. Secondary HPTH: CorrCa/Phos to goal. Cont po vdra, sensipar qd and velphoro as binder.  Dispo - pending dc to SNF   Ozzie Hoyle, PA-C 11/20/2022, 12:15 PM  [employer]

## 2022-11-20 NOTE — Progress Notes (Signed)
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  He was recently admitted to [facility] 4/2-4/23 for acute cholecystitis/septic shock and NSTEMI.  He was initially in the ICU requiring vasopressor support.  General surgery had decided against surgical intervention due to patient's history of aortic stenosis.  He was treated with antibiotics and underwent cholecystectomy tube placement by IR.  Ultimately discharged to SNF with cholecystectomy tube in place.  He was requiring supplemental oxygen at the time of discharge.  Per review of discharge summary, had biopsy of stomach/fundus mass done at [facility] and reportedly results inconclusive per pathologist who recommended deeper biopsy samples in the future.   Patient had syncopal episode at [facility] found to be hypoxic hemoglobin was 7.4 and melena on rectal examination. CXR- worsening bilateral airspace disease due to atelectasis or pneumonia. Patient remained somnolent x 24 hours then improved next day ABG showed pCO2 47 Patient treated for pneumonia, acute upper GI bleeding status post EGD, 1 unit PRBC found to have duodenal polyps, underwent capsule endoscopy showing tiny gastric AVMs versus localized gastritis no further bleeding and Eliquis resumed. Seen by nephrology continued HD MWF.  BP has been soft and meds being adjusted. At this  time complete workup, he is waiting for placement. Per Child psychotherapist patient must be able to tolerate sitting for outpatient HD before he can return to SNF    Subjective: Seen and examined this morning mildly sleepy but denies any complaints  Boot on his bilateral feet  Going for dialysis today-nurse reports she has not been unable to sit on the chair He mentioned yesterday normally takes 2 of 75 mg pill of Lyrica  Assessment and Plan: Principal Problem:   Syncope Active Problems:   Acute on chronic blood loss anemia   ESRD on hemodialysis (HCC)   DM (diabetes mellitus) (HCC)   Hypotension   Acute GI bleeding   Acute encephalopathy   Acute blood loss anemia   Melena   History of colonic polyps   ESRD (end stage renal disease) on dialysis (HCC)   Gastric nodule   Acute toxic metabolic encephalopathy Syncope: Completed workup with CT head, TSH, EEG, ABG overall unremarkable besides mild hypercapnia. Mentation - bit but able to communicate and move all his extremities . Cont PT OT and Pending SNF.   Acute GI bleed, likely upper Acute on chronic anemia secondary to blood loss: B/l HB 7.9-10.3 g, had melena on rectal exam FOBT positive so underwent GI eval- EGD done last month showing normal esophagus, hiatal hernia, single submucosal papule (nodule) found in the stomach, and 2 duodenal polyps. S/P Capsule STUDY:tiny gatric AVM vs mild localized gastritis vs healing from previous biopsy site without bleeding. S/P1 PRBC, no further GI workup>continue PPI. Recs for outpt EUS.  Hemoglobin stable as below Recent Labs    09/11/22 0616 09/12/22 0444 11/13/22 0721 11/14/22 0144 11/18/22 0107 11/19/22 0146 11/20/22 0808  HGB 8.4*   < > 8.1* 8.1* 8.0* 8.1* 8.0*  MCV  105.6*   < > 100.0 103.7* 100.8* 101.5* 102.7*  VITAMINB12 848  --   --   --   --   --   --   FOLATE 25.6  --   --   --   --   --   --   FERRITIN 1,126*  --   --   --   --   --   --   TIBC 200*  --   --   --   --   --   --    IRON 54  --   --   --   --   --   --   RETICCTPCT 3.8*  --   --   --   --   --   --    < > = values in this interval not displayed.      Hypotension chronic: Continue midodrine. PAF on Eliquis Sinus tachycardia: Heart rate in the 110s hemodialysis today > if persistent can consider decreasing midodrine if BP stable.   Pneumonia:cxr-worsening bilateral lower lung airspace disease which may be due to atelectasis or pneumonia, possible small left effusion, and mild cardiomegaly.  No fever or leukocytosis.  Completed antibiotics, unable to wean off oxygen desaturated to mid 80s while asleep on room air continue supplemental oxygen need sleep study as outpatient.  Recent admission at [facility] 4/2-4/23 for acute cholecystitis/septic shock and NSTEMI.  He was initially in the ICU requiring vasopressor support.  General surgery had decided against surgical intervention due to patient's history of aortic stenosis.  He was treated with antibiotics and underwent cholecystectomy tube placement by IR. Cont drain care  ESRD and HD nephrology managing.  Last HD 5/8 Type 2 diabetes mellitus stable A1c 6.5 change SSI achs Recent Labs  Lab 11/19/22 0403 11/19/22 1146 11/19/22 1629 11/19/22 2055 11/20/22 0616  GLUCAP 88 117* 102* 119* 83    Peripheral neuropathy: Reports taking Lyrica 75 mg 2 pills a day currently taking 75 mg once a day in the setting of delirium, will consider resuming home dose ( but bit slow today so will not resume 2 tabs), reports she has not been able to work since December previously used to walk walker and crutches, has left leg weakness  Deconditioning debility in the setting of multiple medical issues dialysis dependent with peripheral neuropathy: Continue PT OT. Having foot pain>ordered Darco boot 5/8>  he has not been able to walk, has not been able to use chair for HD.   QT prolongation monitor Hyponatremia in the setting of ESRD monitor  Class I Obesity:Patient's Body  mass index is 31.89 kg/m. : Will benefit with PCP follow-up, weight loss  healthy lifestyle and outpatient sleep evaluation.   DVT prophylaxis: SCDs Start: 11/09/22 0136 Code Status:   Code Status: Full Code Family Communication: plan of care discussed with patient/RN at bedside. Patient status is: Inpatient because of awaiting skilled nursing facility Level of care: Progressive  Dispo: The patient is from: snf            Anticipated disposition: snf  Objective: Vitals last 24 hrs: Vitals:   11/20/22 1329 11/20/22 1330 11/20/22 1349 11/20/22 1353  BP:  126/87 120/84 125/82  Pulse: (!) 111 (!) 112 (!) 111 (!) 112  Resp: 16 20 15 17   Temp:      TempSrc:      SpO2: 100% 98% 98% 99%  Weight:       Weight change:   Physical Examination:  General exam: AAox3, weak,older appearing HEENT:Oral mucosa moist, Ear/Nose WNL grossly, dentition normal. Respiratory system: bilaterally clear BS, no use of accessory muscle Cardiovascular system: S1 & S2 +, regular rate. Gastrointestinal system: Abdomen soft, rt abd drain+NT,ND,BS+ Nervous System:Alert, awake, moving extremities, weakness of LLE-moving both legs however Extremities: LE ankle edema neg, lower extremities warm Skin: No rashes,no icterus. MSK: Normal muscle bulk,tone, power   Medications reviewed:  Scheduled Meds:  acetaminophen  650 mg Oral Once   apixaban  5 mg Oral BID   calcitRIOL  0.75 mcg Oral Q M,W,F-HD   Chlorhexidine Gluconate Cloth  6 each Topical Q0600   Chlorhexidine Gluconate Cloth  6 each Topical Q0600   Chlorhexidine Gluconate Cloth  6 each Topical Q0600   Chlorhexidine Gluconate Cloth  6 each Topical Q0600   Chlorhexidine Gluconate Cloth  6 each Topical Q0600   cinacalcet  30 mg Oral Q supper   insulin aspart  0-9 Units Subcutaneous TID WC   midodrine  10 mg Oral TID WC   pantoprazole  40 mg Oral BID   polyethylene glycol  17 g Oral Once   pregabalin  75 mg Oral QHS   sucroferric oxyhydroxide  500 mg  Oral TID WC   Continuous Infusions:  anticoagulant sodium citrate      Diet Order             Diet heart healthy/carb modified Room service appropriate? No; Fluid consistency: Thin  Diet effective now                   Intake/Output Summary (Last 24 hours) at 11/20/2022 1422 Last data filed at 11/20/2022 1353 Gross per 24 hour  Intake 250 ml  Output 2200 ml  Net -1950 ml    Net IO Since Admission: -7,836.46 mL [11/20/22 1422]  Wt Readings from Last 3 Encounters:  11/20/22 100.8 kg  09/25/22 96 kg  09/22/22 96.6 kg     Unresulted Labs (From admission, onward)     Start     Ordered   Signed and Held  Renal function panel  Once,   R       Question:  Specimen collection method  Answer:  Lab=Lab collect   Signed and Held          Data Reviewed: I have personally reviewed following labs and imaging studies CBC: Recent Labs  Lab 11/14/22 0144 11/18/22 0107 11/19/22 0146 11/20/22 0808  WBC 7.9 7.9 7.5 7.5  HGB 8.1* 8.0* 8.1* 8.0*  HCT 25.3* 25.6* 26.9* 26.7*  MCV 103.7* 100.8* 101.5* 102.7*  PLT 204 284 290 289    Basic Metabolic Panel: Recent Labs  Lab 11/14/22 0144 11/16/22 0114 11/18/22 0107 11/19/22 0146 11/20/22 0808  NA 131* 134* 134* 134* 135  K 3.6 4.1 3.8 3.8 4.1  CL 95* 97* 97* 98 99  CO2 26 28 29 30 28   GLUCOSE 119* 113* 96 87 80  BUN 14 25* 22 14 19   CREATININE 4.67* 7.31* 6.14* 4.48* 6.21*  CALCIUM 7.8* 8.3* 8.4* 8.3* 8.6*  PHOS  --   --   --   --  3.2    GFR: Estimated Creatinine Clearance: 11.7 mL/min (A) (by C-G formula based on SCr of 6.21 mg/dL (H)). Liver Function Tests: Recent Labs  Lab 11/14/22 0144 11/16/22 0114 11/18/22 0107 11/20/22 0808  AST 21 18 18   --   ALT 22 20 18   --   ALKPHOS 74 77 76  --   BILITOT  0.8 0.8 0.8  --   PROT 6.6 7.0 6.8  --   ALBUMIN 1.9* 2.0* 1.9* 2.0*   CBG: Recent Labs  Lab 11/19/22 0403 11/19/22 1146 11/19/22 1629 11/19/22 2055 11/20/22 0616  GLUCAP 88 117* 102* 119* 83    No  results found for this or any previous visit (from the past 240 hour(s)).  Antimicrobials: Anti-infectives (From admission, onward)    Start     Dose/Rate Route Frequency Ordered Stop   11/11/22 1200  vancomycin (VANCOCIN) IVPB 1000 mg/200 mL premix        1,000 mg 200 mL/hr over 60 Minutes Intravenous Every M-W-F (Hemodialysis) 11/09/22 1523 11/13/22 1429   11/09/22 1615  vancomycin (VANCOREADY) IVPB 2000 mg/400 mL        2,000 mg 200 mL/hr over 120 Minutes Intravenous  Once 11/09/22 1521 11/10/22 0212   11/09/22 1600  ceFEPIme (MAXIPIME) 1 g in sodium chloride 0.9 % 100 mL IVPB        1 g 200 mL/hr over 30 Minutes Intravenous Every 24 hours 11/09/22 1459 11/13/22 2122   11/09/22 1545  linezolid (ZYVOX) IVPB 600 mg  Status:  Discontinued        600 mg 300 mL/hr over 60 Minutes Intravenous Every 12 hours 11/09/22 1500 11/09/22 1519     Culture/Microbiology    Component Value Date/Time   SDES ABSCESS 06/15/2022 1344   SPECREQUEST PERIRECTAL 06/15/2022 1344   CULT  06/15/2022 1344    FEW ESCHERICHIA COLI NO ANAEROBES ISOLATED Performed at [facility], [address] [place], [place] [address]    REPTSTATUS 06/20/2022 FINAL 06/15/2022 1344  Radiology Studies: No results found.   LOS: 11 days   Lanae Boast, MD [facility]  11/20/2022, 2:22 PM

## 2022-11-20 NOTE — Progress Notes (Signed)
Physical Therapy Treatment Patient Details Name: Justin [name] MRN: [id number] DOB: [birth year] Today's Date: 11/20/2022   History of Present Illness Pt is a 77 y.o. male admitted 4/28 from SNF with syncope. Pt with recent hospitalization at [facility] in April 2024 for NSTEMI. 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    PT Comments    Pt received in supine, agreeable to therapy session after HD and after eating lunch, pt with improved tolerance for seated squat/scoot transfer training from bed>drop arm chair this date. Pt performed lateral scoot transfers from bed>chair and return from chair>bed with +2 mod/maxA and bed pad assist. Pt limited due to c/o bowel urgency once in chair, necessitating return to supine and pt up on bed pan at end of session, RN/NT notified. Pt c/o bottom discomfort, he has geomat cushion in room, RN notified he may need foam dressing vs ointment due to further skin breakdown on his bottom prior to return to chair. Mr Shipes would benefit from hoyer OOB to HD recliner prior to next HD session to ensure he will be able to tolerate OOB at least 4 hours at a time in order to return to SNF. This was discussed with pt but he will need reinforcement of this. Pt continues to benefit from PT services to progress toward functional mobility goals.  Recommendations for follow up therapy are one component of a multi-disciplinary discharge planning process, led by the attending physician.  Recommendations may be updated based on patient status, additional functional criteria and insurance authorization.  Follow Up Recommendations  Can patient physically be transported by private vehicle:  No    Assistance Recommended at Discharge Frequent or constant Supervision/Assistance  Patient can return home with the following Two people to help with walking and/or transfers;A lot of help with bathing/dressing/bathroom   Equipment Recommendations  Other (comment);Wheelchair (measurements PT);Wheelchair cushion (measurements PT) (roho WC cushion due to skin breakdown, possibly slide board once wounds healed, mechanical lift for OOB transfers)    Recommendations for Other Services       Precautions / Restrictions Precautions Precautions: Fall;Other (comment) Precaution Comments: Pressure sores B heels, hypersensitivity/pain B feet Required Braces or Orthoses:  (pt has bil heel offloading darco shoes in room) Restrictions Weight Bearing Restrictions: No Other Position/Activity Restrictions: pt reports bil foot discomfort but with darco shoes able to tolerate some pressure     Mobility  Bed Mobility Overal bed mobility: Needs Assistance Bed Mobility: Rolling, Sidelying to Sit, Sit to Sidelying Rolling: Min assist Sidelying to sit: Mod assist, HOB elevated     Sit to sidelying: +2 for safety/equipment, Mod assist General bed mobility comments: Heavy reliance on bed rail and mod A to elevate trunk. Pt able to scoot hips forward to EOB with bed rail support.    Transfers Overall transfer level: Needs assistance Equipment used:  (bed pad) Transfers: Bed to chair/wheelchair/BSC       Squat pivot transfers: Total assist    Lateral/Scoot Transfers: Max assist, +2 physical assistance, From elevated surface, Mod assist General transfer comment: with bil darco shoes on, pt able to tolerate WB through his feet to attempt lateral seated scooting OOB to drop arm chair. Attempted initially squat pivot to reduce shearing force on his bottom, however pt  with poor tolerance for squatting, so performed x3 scoot/squats toward drop arm chair on his L side. Once up in chair, pt c/o bowel  urgency and bed pad noted to have a smear on it, so pt assisted with +2 maxA (increased assist from lower chair surface back to bed) for return squat/scoot pivot. Pt rolled to L/R to place on bed pan at end of session, RN notified.    Ambulation/Gait               General Gait Details: pt reports standing too painful with bil heel wounds   Stairs             Wheelchair Mobility    Modified Rankin (Stroke Patients Only)       Balance Overall balance assessment: Needs assistance Sitting-balance support: Feet supported, Bilateral upper extremity supported Sitting balance-Leahy Scale: Fair     Standing balance support: Bilateral upper extremity supported Standing balance-Leahy Scale: Zero Standing balance comment: pt able to raise hips ~2-3" off EOB but unable to stand fully upright due to c/o pain and weakness                            Cognition Arousal/Alertness: Awake/alert Behavior During Therapy: WFL for tasks assessed/performed Overall Cognitive Status: No family/caregiver present to determine baseline cognitive functioning                                 General Comments: Pt with decreased insight into need to sit up in chair, stating "I took the chair to HD when I was there (at SNF) before". Reviewed with him that he has not been able to demonstrate sitting tolerance >2 hours in chair since admission which has been almost 2 weeks, so he is weaker than previously and will need to work hard to rebuild his tolerance so he can go back to SNF. Pt with bowel urgency limiting his OOB tolerance today.        Exercises      General Comments General comments (skin integrity, edema, etc.): some loose stool noted on bed pad and pt placed back on bed pan in supine at end of session, RN/NT notified      Pertinent Vitals/Pain Pain Assessment Pain Assessment: Faces Faces Pain Scale: Hurts even more Pain Location: bottom while up in chair; pt  reports bowel urgency after sitting ~3 mins, necessitating return to bed Pain Descriptors / Indicators: Tender, Sore, Grimacing Pain Intervention(s): Limited activity within patient's tolerance, Monitored during session, Repositioned, Other (comment) (geomat cushion in his chair)    Home Living                          Prior Function            PT Goals (current goals can now be found in the care plan section) Acute Rehab PT Goals Patient Stated Goal: to walk PT Goal Formulation: With patient Time For Goal Achievement: 11/27/22 Progress towards PT goals: Progressing toward goals    Frequency    Min 1X/week      PT Plan Current plan remains appropriate    Co-evaluation              AM-PAC PT "6 Clicks" Mobility   Outcome Measure  Help needed turning from your back to your side while in a flat bed  without using bedrails?: A Lot (without rails) Help needed moving from lying on your back to sitting on the side of a flat bed without using bedrails?: A Lot Help needed moving to and from a bed to a chair (including a wheelchair)?: Total Help needed standing up from a chair using your arms (e.g., wheelchair or bedside chair)?: Total Help needed to walk in hospital room?: Total Help needed climbing 3-5 steps with a railing? : Total 6 Click Score: 8    End of Session Equipment Utilized During Treatment: Gait belt;Oxygen Activity Tolerance: Patient tolerated treatment well;Patient limited by fatigue Patient left: in bed;with call bell/phone within reach;with bed alarm set;with family/visitor present;Other (comment) (on bed pan) Nurse Communication: Mobility status;Need for lift equipment;Other (comment);Precautions (use hoyer for OOB to chair with +2 for safety, pt on bed pan at end of session, needs skin checked on bottom may need additional foam dressings due to skin breakdown vs ointment) PT Visit Diagnosis: Difficulty in walking, not elsewhere classified  (R26.2);Pain;Other abnormalities of gait and mobility (R26.89) Pain - part of body: Ankle and joints of foot     Time: 1478-2956 PT Time Calculation (min) (ACUTE ONLY): 23 min  Charges:  $Therapeutic Activity: 23-37 mins                     Tesla Keeler P., PTA Acute Rehabilitation Services Secure Chat Preferred 9a-5:30pm Office: [phone]    Dorathy Kinsman [facility] 11/20/2022, 5:11 PM

## 2022-11-21 DIAGNOSIS — F488 Other specified nonpsychotic mental disorders: Secondary | ICD-10-CM

## 2022-11-21 LAB — GLUCOSE, CAPILLARY
Glucose-Capillary: 67 mg/dL — ABNORMAL LOW (ref 70–99)
Glucose-Capillary: 105 mg/dL — ABNORMAL HIGH (ref 70–99)
Glucose-Capillary: 116 mg/dL — ABNORMAL HIGH (ref 70–99)

## 2022-11-21 LAB — HEMOGLOBIN AND HEMATOCRIT, BLOOD
HCT: 27.5 % — ABNORMAL LOW (ref 39.0–52.0)
Hemoglobin: 8.3 g/dL — ABNORMAL LOW (ref 13.0–17.0)

## 2022-11-21 MED ORDER — DARBEPOETIN ALFA 100 MCG/0.5ML IJ SOSY
100.0000 ug | PREFILLED_SYRINGE | INTRAMUSCULAR | Status: DC
  Administered 2022-11-21: 100 ug via SUBCUTANEOUS
  Filled 2022-11-21: qty 0.5

## 2022-11-21 MED ORDER — CALCITRIOL 0.25 MCG PO CAPS
0.7500 ug | ORAL_CAPSULE | ORAL | Status: DC
  Administered 2022-11-23: 0.75 ug via ORAL
  Filled 2022-11-21: qty 3

## 2022-11-21 NOTE — Progress Notes (Signed)
[facility] Progress Note   Subjective:   Seen in room - denies CP/dyspnea, using O2. Explained rationale for sitting in recliner to prove that can tolerate for outpatient HD - he is agreeable to sit in recliner in room today - will send message to his RN.  Objective Vitals:   11/21/22 0000 11/21/22 0300 11/21/22 0450 11/21/22 0800  BP:  108/63 90/75 (!) 78/61  Pulse:  (!) 111 (!) 112 (!) 111  Resp:  15 18 19   Temp: 98.2 F (36.8 C) 98.1 F (36.7 C)  98.1 F (36.7 C)  TempSrc:  Oral  Oral  SpO2:  98% 100% 97%  Weight:  96.7 kg     Physical Exam General: Chronically ill appearing man, NAD. Nasal O2 in place. Heart: RRR; 2/6 murmur Lungs: Scattered rhonchi throughout Abdomen: soft Extremities: no LE edema; R foot in brace Dialysis Access:  LUE AVF + bruit  Additional Objective Labs: Basic Metabolic Panel: Recent Labs  Lab 11/18/22 0107 11/19/22 0146 11/20/22 0808  NA 134* 134* 135  K 3.8 3.8 4.1  CL 97* 98 99  CO2 29 30 28   GLUCOSE 96 87 80  BUN 22 14 19   CREATININE 6.14* 4.48* 6.21*  CALCIUM 8.4* 8.3* 8.6*  PHOS  --   --  3.2   Liver Function Tests: Recent Labs  Lab 11/16/22 0114 11/18/22 0107 11/20/22 0808  AST 18 18  --   ALT 20 18  --   ALKPHOS 77 76  --   BILITOT 0.8 0.8  --   PROT 7.0 6.8  --   ALBUMIN 2.0* 1.9* 2.0*   CBC: Recent Labs  Lab 11/18/22 0107 11/19/22 0146 11/20/22 0808  WBC 7.9 7.5 7.5  HGB 8.0* 8.1* 8.0*  HCT 25.6* 26.9* 26.7*  MCV 100.8* 101.5* 102.7*  PLT 284 290 289   Medications:   acetaminophen  650 mg Oral Once   apixaban  5 mg Oral BID   [START ON 11/23/2022] calcitRIOL  0.75 mcg Oral Q M,W,F-HD   Chlorhexidine Gluconate Cloth  6 each Topical Q0600   cinacalcet  30 mg Oral Q supper   insulin aspart  0-9 Units Subcutaneous TID WC   midodrine  10 mg Oral TID WC   pantoprazole  40 mg Oral BID   polyethylene glycol  17 g Oral Once   pregabalin  75 mg Oral QHS   sucroferric oxyhydroxide  500 mg Oral  TID WC    Dialysis Orders: [facility] MWF 3h  450/600  99kg  2/2.5 bath  LUA AVF  Hep none - last HD 4/26, post wt 111kg, coming off 10+kg over last 2 sessions - rocaltrol 0.75 mcg po tiw - venofer 100mg  tiw thru 5/08 - mircera 200 mcg IV q 2 wks, last 4/26 - last Hb 7.7 on 4/24   Assessment/Plan: LOC episode/ AMS - had brief CPR x 4 min at SNF before EMS arrived. Was somnolent here x 24 hrs then improved the next day. ABG showed pCO2 of 57. AMS has resolved.  Pneumonia: S/p course of antibiotics here. On O2 via [place] for desaturations at night. Acute upper GIB - melena on initial ED exam, recent EGD showed duodenal polyps. Pt rec'd 1u prbcs and GI did capsule endo showing tiny gastric AVMs vs localized gastritis. No further bleeding. Eliquis resumed. Acute hypoxic resp failure/ vol overload - pt admitted 10kg over dry wt w/ diffuse edema on exam. Now below prior EDW - continue to challenge as  tolerated. 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 Next HD Monday 5/13. Was supposed to dialyze in recliner, but didn't - needs to sit in room recliner today for ~4-5hr to prove that he can tolerate - d/w patient - he is agreeable. BP: Hypotensive this AM - continue midodrine 10mg  TID for now.  Parox afib - rate currently controlled. Beta blocker on hold due to hypotension.  Anemia esrd - Hb stable in low 8's, close to OP recent labs. Continuing the outpt IV iron load while here. ESA ordered to be given today. Secondary HPTH: CorrCa/Phos to goal. Cont po vdra, sensipar qd and velphoro as binder.  Dispo - pending dc to SNF   Ozzie Hoyle, PA-C 11/21/2022, 8:58 AM  [employer]

## 2022-11-21 NOTE — Progress Notes (Signed)
Patient has sat in recliner for 4.5 hours, transferred back to bed with staff asisst.

## 2022-11-21 NOTE — Progress Notes (Signed)
PT Cancellation Note  Patient Details Name: Justin [name] MRN: [id number] DOB: 18-Dec-1945   Cancelled Treatment:    Reason Eval/Treat Not Completed: Medical issues which prohibited therapy (Pt with very low BP 72/52 in supine this AM and elevated resting HR, will defer OOB efforts until BP improved, hopefully later in the day, discussed with RN.) Will continue efforts per PT plan of care as schedule permits if medically appropriate.   Justin [name] 11/21/2022, 10:26 AM

## 2022-11-21 NOTE — Progress Notes (Signed)
Physical Therapy Treatment Patient Details Name: Justin [name] MRN: [id number] DOB: Dec 09, 1945 Today's Date: 11/21/2022   History of Present Illness Pt is a 77 y.o. male admitted 4/28 from SNF with syncope. Pt with recent hospitalization at [facility] in April 2024 for NSTEMI. 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    PT Comments    Pt received in supine, agreeable to therapy session and with good participation and tolerance session. Empahsis on rolling for placement of hoyer lift pad, BLE supine/seated exercises for strengthening and instruction on pressure relief strategies. Pt needing up to minA to roll and totalA for lift OOB with maxi-move lift. Pt up in chair at noon, encouraged him to try sitting up 4 hours at least to ensure he can tolerate OOB to chair at SNF. Pt continues to benefit from PT services to progress toward functional mobility goals.   Recommendations for follow up therapy are one component of a multi-disciplinary discharge planning process, led by the attending physician.  Recommendations may be updated based on patient status, additional functional criteria and insurance authorization.  Follow Up Recommendations  Can patient physically be transported by private vehicle: No    Assistance Recommended at Discharge Frequent or constant Supervision/Assistance  Patient can return home with the following Two people to help with walking and/or transfers;A lot of help with bathing/dressing/bathroom   Equipment Recommendations  Other (comment);Wheelchair (measurements PT);Wheelchair cushion (measurements PT) (currently: roho WC cushion due to skin breakdown, possibly slide board  once wounds healed, mechanical lift for OOB transfers, air bed)    Recommendations for Other Services       Precautions / Restrictions Precautions Precautions: Fall;Other (comment) Precaution Comments: Pressure sores B heels, hypersensitivity/pain B feet Required Braces or Orthoses:  (pt has bil heel offloading darco shoes in room) Restrictions Weight Bearing Restrictions: No Other Position/Activity Restrictions: pt reports bil foot discomfort but with darco shoes able to tolerate some pressure of forefoot on floor     Mobility  Bed Mobility Overal bed mobility: Needs Assistance Bed Mobility: Rolling Rolling: Min assist         General bed mobility comments: good initiation to roll to side today, min vcs/tcs for improved body mechanics and pt able to effectively use rail to assist, minA to roll more fully onto side for hygiene assist/bed pad removal and replacement and hoyer lift pad placement.    Transfers Overall transfer level: Needs assistance Equipment used:  (hoyer) Transfers: Bed to chair/wheelchair/BSC             General transfer comment: pt with good tolerance for hoyer lift OOB to chair, VSS on 2L O2 [place]. Transfer via Lift Equipment: Maximove  Ambulation/Gait               General Gait Details: pt reports standing too painful with bil heel wounds   Stairs             Wheelchair Mobility    Modified Rankin (Stroke Patients Only)       Balance Overall balance assessment: Needs assistance Sitting-balance support: Feet supported, Bilateral upper extremity supported Sitting balance-Leahy Scale: Fair         Standing balance comment: defer, pt BP soft and goal of session hoyer OOB to prepare  for HD sessions and pressure relief strategies                            Cognition Arousal/Alertness: Awake/alert Behavior During Therapy: WFL for tasks assessed/performed Overall Cognitive Status: No family/caregiver present to  determine baseline cognitive functioning                                 General Comments: Pt with decreased insight into need to sit up in chair, stating "I took the chair to HD when I was there (at SNF) before". Reviewed with him that he has not been able to demonstrate sitting tolerance >2 hours in chair since admission which has been almost 2 weeks, so he is weaker than previously and will need to work hard to rebuild his tolerance so he can go back to SNF. Pt receptive to this instruction. Pt agreeable to instruction on supine/seated pressure relief strategies in bed/chair and frequency, will need reminders        Exercises Other Exercises Other Exercises: supine BLE AAROM: heel slides, hip abduction x5-10 reps ea Other Exercises: seated BLE A/AAROM: LAQ x5 reps ea Other Exercises: lateral and anterior leans in chair x3 reps in ea direction with teachback for pressure relief strategies/frequency q15-20 mins while up in chair. and q2h in bed.    General Comments General comments (skin integrity, edema, etc.): pt needed totalA for hygiene assist prior to lift OOB, small smear on his skin not a significant amount; barrier ointment applied after; geomat cushion in place in his chair, pt defers BLE elevated in recliner and prefers to keep his feet (with darco shoes donned) on the floor. RN/MD notified pt would benefit from air bed and prevalon boots. also would be helpful if he was in room with maxi-sky lift for more room to maneuver him.      Pertinent Vitals/Pain Pain Assessment Pain Assessment: 0-10 Pain Score: 5  Pain Location: bottom while in supine, appears more severe with peri care prior to hoyer OOB. PTA applied barrier ointment after peri care (pt just had small smear of stool on bed pad/bottom, reports no significant bowel urgency during session) Pain Descriptors / Indicators: Tender, Sore, Grimacing, Moaning Pain Intervention(s): Limited activity within patient's  tolerance, Monitored during session, Repositioned, Patient requesting pain meds-RN notified, Other (comment) (informed RN/MD pt may benefit from air bed/prevalon boots for pressure relief)     PT Goals (current goals can now be found in the care plan section) Acute Rehab PT Goals Patient Stated Goal: to get stronger at rehab and eventually back to walking, less pain PT Goal Formulation: With patient Time For Goal Achievement: 11/27/22 Progress towards PT goals: Progressing toward goals    Frequency    Min 1X/week      PT Plan Current plan remains appropriate       AM-PAC PT "6 Clicks" Mobility   Outcome Measure  Help needed turning from your back to your side while in a flat bed without using bedrails?: A Lot (without rails) Help needed moving from lying on your back to sitting on the side of a flat bed without using bedrails?: A Lot Help needed moving to and from a bed to a chair (including a wheelchair)?: Total Help needed standing up from a chair using your arms (e.g., wheelchair or bedside chair)?: Total Help needed to walk in hospital room?: Total Help  needed climbing 3-5 steps with a railing? : Total 6 Click Score: 8    End of Session Equipment Utilized During Treatment: Oxygen;Other (comment) (darco shoes bil to offload his heels; mechanical lift/pad) Activity Tolerance: Patient tolerated treatment well;Other (comment) (c/o moderate pain) Patient left: with call bell/phone within reach;in chair (pt requesting not to be reclined and to keep his feet on the floor) Nurse Communication: Mobility status;Need for lift equipment;Other (comment);Precautions;Patient requests pain meds (use hoyer for return transfer, pt would benefit from air bed/prevalon boots ordered due to skin breakdown on bottom/back and heels; could benefit from being moved to maxi sky room if able; needs to sit in chair 4h from time of session for SNF) PT Visit Diagnosis: Difficulty in walking, not elsewhere  classified (R26.2);Pain;Other abnormalities of gait and mobility (R26.89) Pain - part of body: Ankle and joints of foot     Time: 1140-1215 PT Time Calculation (min) (ACUTE ONLY): 35 min  Charges:  $Therapeutic Exercise: 8-22 mins $Therapeutic Activity: 8-22 mins                     Jehu Mccauslin P., PTA Acute Rehabilitation Services Secure Chat Preferred 9a-5:30pm Office: [phone]    Dorathy Kinsman [facility] 11/21/2022, 12:54 PM

## 2022-11-21 NOTE — Progress Notes (Signed)
Patient's BP 78/61 HR 112 on 8am vitals. Patient expressed feeling "normal". Scheduled midodrine administered at 0819. Recheck BP 85/56 automatic. Manual BP 92/50.  Initally requested PT to hold session d/t BP. After manual - PT advised patient is cleared to activity.

## 2022-11-21 NOTE — Progress Notes (Signed)
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   He was recently admitted to [facility] 4/2-4/23 for acute cholecystitis/septic shock and NSTEMI.  He was initially in the ICU requiring vasopressor support.  General surgery had decided against surgical intervention due to patient's history of aortic stenosis.  He was treated with antibiotics and underwent cholecystectomy tube placement by IR.  Ultimately discharged to SNF with cholecystectomy tube in place.  He was requiring supplemental oxygen at the time of discharge.  Per review of discharge summary, had biopsy of stomach/fundus mass done at [facility] and reportedly results inconclusive per pathologist who recommended deeper biopsy samples in the future.   Patient had syncopal episode at [facility] found to be hypoxic hemoglobin was 7.4 and melena on rectal examination. CXR- worsening bilateral airspace disease due to atelectasis or pneumonia. Patient remained somnolent x 24 hours then improved next day ABG showed pCO2 47 Patient treated for pneumonia, acute upper GI bleeding status post EGD, 1 unit PRBC found to have duodenal polyps, underwent capsule endoscopy showing tiny gastric AVMs versus localized gastritis no further bleeding and Eliquis resumed. Seen by nephrology continued HD MWF.  BP has  been soft and meds being adjusted. At this time complete workup, he is waiting for placement. Per social worker patient must be able to tolerate sitting for outpatient HD before he can return to SNF"  11/21/2022: Patient seen.  No new complaints.  Patient is awaiting disposition.  Patient has been able to see on the chair for over 1 hour.  The goal is to have patient sit for about 5 hours.  Nephrology input is highly appreciated.   Subjective: No chest pain. No shortness of breath.    Assessment and Plan: Principal Problem:   Syncope Active Problems:   DM (diabetes mellitus) (HCC)   Acute on chronic blood loss anemia   ESRD on hemodialysis (HCC)   Hypotension   Acute GI bleeding   Acute encephalopathy   Acute blood loss anemia   Melena   History of colonic polyps   ESRD (end stage renal disease) on dialysis (HCC)   Gastric nodule   Acute toxic metabolic encephalopathy Syncope: Completed workup with CT head, TSH, EEG, ABG overall unremarkable besides mild hypercapnia. Mentation - bit but able to communicate and move all his extremities . Cont PT OT and Pending SNF. 11/21/2022: Resolved.  Patient is awaiting disposition.   Acute GI bleed, likely upper Acute on chronic anemia secondary to blood loss: B/l HB 7.9-10.3 g, had melena on rectal exam FOBT positive so underwent GI eval- EGD done last month showing normal esophagus, hiatal hernia, single submucosal papule (nodule) found in the stomach, and 2 duodenal polyps. S/P Capsule STUDY:tiny gatric AVM vs mild localized gastritis vs healing from previous biopsy site without bleeding. S/P1 PRBC, no further GI workup>continue PPI. Recs for outpt EUS.  Hemoglobin stable as below Recent Labs    09/11/22 0616 09/12/22 0444  11/13/22 0721 11/14/22 0144 11/18/22 0107 11/19/22 0146 11/20/22 0808  HGB 8.4*   < > 8.1* 8.1* 8.0* 8.1* 8.0*  MCV 105.6*   < > 100.0 103.7* 100.8* 101.5* 102.7*  VITAMINB12 848  --   --   --   --   --   --    FOLATE 25.6  --   --   --   --   --   --   FERRITIN 1,126*  --   --   --   --   --   --   TIBC 200*  --   --   --   --   --   --   IRON 54  --   --   --   --   --   --   RETICCTPCT 3.8*  --   --   --   --   --   --    < > = values in this interval not displayed.      Hypotension chronic: Continue midodrine. PAF on Eliquis Sinus tachycardia: Heart rate in the 110s hemodialysis today > if persistent can consider decreasing midodrine if BP stable.   Pneumonia:cxr-worsening bilateral lower lung airspace disease which may be due to atelectasis or pneumonia, possible small left effusion, and mild cardiomegaly.  No fever or leukocytosis.  Completed antibiotics, unable to wean off oxygen desaturated to mid 80s while asleep on room air continue supplemental oxygen need sleep study as outpatient.  Recent admission at [facility] 4/2-4/23 for acute cholecystitis/septic shock and NSTEMI.  He was initially in the ICU requiring vasopressor support.  General surgery had decided against surgical intervention due to patient's history of aortic stenosis.  He was treated with antibiotics and underwent cholecystectomy tube placement by IR. Cont drain care  ESRD and HD nephrology managing.  Last HD 5/10 Type 2 diabetes mellitus stable A1c 6.5 change SSI achs Recent Labs  Lab 11/20/22 1439 11/20/22 1641 11/20/22 2029 11/21/22 0634 11/21/22 1248  GLUCAP 77 96 120* 67* 105*   11/21/2022: Continue CKD Monday Wednesday Friday.  Peripheral neuropathy: Reports taking Lyrica 75 mg 2 pills a day currently taking 75 mg once a day in the setting of delirium, will consider resuming home dose ( but bit slow today so will not resume 2 tabs), reports she has not been able to work since December previously used to walk walker and crutches, has left leg weakness  Deconditioning debility in the setting of multiple medical issues dialysis dependent with peripheral neuropathy: Continue PT OT. Having foot pain>ordered Darco boot  5/8>  he has not been able to walk, has not been able to use chair for HD.   QT prolongation monitor  Hyponatremia in the setting of ESRD monitor.  Likely volume related.  Class I Obesity:Patient's Body mass index is 30.59 kg/m. : Will benefit with PCP follow-up, weight loss  healthy lifestyle and outpatient sleep evaluation.   DVT prophylaxis: SCDs Start: 11/09/22 0136 Code Status:   Code Status: Full Code Family Communication: plan of care discussed with patient/RN at bedside. Patient status is: Inpatient because of awaiting skilled nursing facility Level of care: Progressive  Dispo: The patient is from: snf            Anticipated disposition: snf  Objective: Vitals last 24 hrs: Vitals:   11/21/22 1017 11/21/22 1033 11/21/22 1110 11/21/22 1210  BP: (!) 75/52 (!) 92/50 (!) 92/57 103/71  Pulse: (!) 111  (!) 111 (!) 114  Resp: 17  16 14   Temp:      TempSrc:      SpO2: 100%  100% 100%  Weight:       Weight change:   Physical Examination: General exam: Patient is not in any distress.  Patient is awake and alert.   HEENT: Patient is pale.  No jaundice.   Respiratory system: Clear to auscultation.   Cardiovascular system: S1 & S2 . Gastrointestinal system: Abdomen soft, rt abd drain+NT,ND,BS+ Nervous System: Patient is awake and alert.  Patient moves all extremities.   Medications reviewed:  Scheduled Meds:  acetaminophen  650 mg Oral Once   apixaban  5 mg Oral BID   [START ON 11/23/2022] calcitRIOL  0.75 mcg Oral Q M,W,F-HD   Chlorhexidine Gluconate Cloth  6 each Topical Q0600   cinacalcet  30 mg Oral Q supper   darbepoetin (ARANESP) injection - DIALYSIS  100 mcg Subcutaneous Q Sat-1800   insulin aspart  0-9 Units Subcutaneous TID WC   midodrine  10 mg Oral TID WC   pantoprazole  40 mg Oral BID   polyethylene glycol  17 g Oral Once   pregabalin  75 mg Oral QHS   sucroferric oxyhydroxide  500 mg Oral TID WC   Continuous Infusions:    Diet Order             Diet  heart healthy/carb modified Room service appropriate? No; Fluid consistency: Thin  Diet effective now                   Intake/Output Summary (Last 24 hours) at 11/21/2022 1725 Last data filed at 11/21/2022 1610 Gross per 24 hour  Intake --  Output 200 ml  Net -200 ml    Net IO Since Admission: -7,846.46 mL [11/21/22 1725]  Wt Readings from Last 3 Encounters:  11/21/22 96.7 kg  09/25/22 96 kg  09/22/22 96.6 kg     Unresulted Labs (From admission, onward)     Start     Ordered   Signed and Held  Renal function panel  Once,   R       Question:  Specimen collection method  Answer:  Lab=Lab collect   Signed and Held          Data Reviewed: I have personally reviewed following labs and imaging studies CBC: Recent Labs  Lab 11/18/22 0107 11/19/22 0146 11/20/22 0808  WBC 7.9 7.5 7.5  HGB 8.0* 8.1* 8.0*  HCT 25.6* 26.9* 26.7*  MCV 100.8* 101.5* 102.7*  PLT 284 290 289    Basic Metabolic Panel: Recent Labs  Lab 11/16/22 0114 11/18/22 0107 11/19/22 0146 11/20/22 0808  NA 134* 134* 134* 135  K 4.1 3.8 3.8 4.1  CL 97* 97* 98 99  CO2 28 29 30 28   GLUCOSE 113* 96 87 80  BUN 25* 22 14 19   CREATININE 7.31* 6.14* 4.48* 6.21*  CALCIUM 8.3* 8.4* 8.3* 8.6*  PHOS  --   --   --  3.2    GFR: Estimated Creatinine Clearance: 11.4 mL/min (A) (by C-G formula based on SCr of 6.21 mg/dL (H)). Liver Function Tests: Recent Labs  Lab 11/16/22 0114 11/18/22 0107 11/20/22 0808  AST 18 18  --   ALT 20 18  --   ALKPHOS 77 76  --   BILITOT 0.8 0.8  --   PROT 7.0 6.8  --   ALBUMIN 2.0* 1.9* 2.0*   CBG: Recent Labs  Lab 11/20/22 1439 11/20/22 1641  11/20/22 2029 11/21/22 0634 11/21/22 1248  GLUCAP 77 96 120* 67* 105*    No results found for this or any previous visit (from the past 240 hour(s)).  Antimicrobials: Anti-infectives (From admission, onward)    Start     Dose/Rate Route Frequency Ordered Stop   11/11/22 1200  vancomycin (VANCOCIN) IVPB 1000 mg/200 mL  premix        1,000 mg 200 mL/hr over 60 Minutes Intravenous Every M-W-F (Hemodialysis) 11/09/22 1523 11/13/22 1429   11/09/22 1615  vancomycin (VANCOREADY) IVPB 2000 mg/400 mL        2,000 mg 200 mL/hr over 120 Minutes Intravenous  Once 11/09/22 1521 11/10/22 0212   11/09/22 1600  ceFEPIme (MAXIPIME) 1 g in sodium chloride 0.9 % 100 mL IVPB        1 g 200 mL/hr over 30 Minutes Intravenous Every 24 hours 11/09/22 1459 11/13/22 2122   11/09/22 1545  linezolid (ZYVOX) IVPB 600 mg  Status:  Discontinued        600 mg 300 mL/hr over 60 Minutes Intravenous Every 12 hours 11/09/22 1500 11/09/22 1519     Culture/Microbiology    Component Value Date/Time   SDES ABSCESS 06/15/2022 1344   SPECREQUEST PERIRECTAL 06/15/2022 1344   CULT  06/15/2022 1344    FEW ESCHERICHIA COLI NO ANAEROBES ISOLATED Performed at [facility], [address] [place], [place] [address]    REPTSTATUS 06/20/2022 FINAL 06/15/2022 1344  Radiology Studies: No results found.   LOS: 12 days   Barnetta Chapel, MD [facility]  11/21/2022, 5:25 PM

## 2022-11-22 DIAGNOSIS — I9589 Other hypotension: Secondary | ICD-10-CM

## 2022-11-22 LAB — GLUCOSE, CAPILLARY
Glucose-Capillary: 99 mg/dL (ref 70–99)
Glucose-Capillary: 132 mg/dL — ABNORMAL HIGH (ref 70–99)
Glucose-Capillary: 102 mg/dL — ABNORMAL HIGH (ref 70–99)
Glucose-Capillary: 93 mg/dL (ref 70–99)

## 2022-11-22 MED ORDER — MIDODRINE HCL 5 MG PO TABS: 15.0000 mg | ORAL_TABLET | Freq: Three times a day (TID) | ORAL | Status: DC

## 2022-11-22 MED ORDER — LACTATED RINGERS IV BOLUS
500.0000 mL | Freq: Once | INTRAVENOUS | Status: AC
  Administered 2022-11-22: 500 mL via INTRAVENOUS

## 2022-11-22 MED ORDER — ORAL CARE MOUTH RINSE: 15.0000 mL | OROMUCOSAL | Status: DC | PRN

## 2022-11-22 MED ORDER — MIDODRINE HCL 5 MG PO TABS
15.0000 mg | ORAL_TABLET | Freq: Three times a day (TID) | ORAL | Status: DC
  Administered 2022-11-22 – 2022-11-24 (×8): 15 mg via ORAL
  Filled 2022-11-22 (×9): qty 3

## 2022-11-22 NOTE — Progress Notes (Signed)
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   He was recently admitted to [facility] 4/2-4/23 for acute cholecystitis/septic shock and NSTEMI.  He was initially in the ICU requiring vasopressor support.  General surgery had decided against surgical intervention due to patient's history of aortic stenosis.  He was treated with antibiotics and underwent cholecystostomy tube placement by IR.  Ultimately discharged to SNF with cholecystectomy tube in place.  He was requiring supplemental oxygen at the time of discharge.  Per review of discharge summary, had biopsy of stomach/fundus mass done at [facility] and reportedly results inconclusive per pathologist who recommended deeper biopsy samples in the future.   Patient had syncopal episode at [facility] found to be hypoxic hemoglobin was 7.4 and melena on rectal examination. CXR- worsening bilateral airspace disease due to atelectasis or pneumonia. Patient remained somnolent x 24 hours then improved next day ABG showed pCO2 47 Patient treated for pneumonia, acute upper GI bleeding status post EGD, 1 unit PRBC found to have duodenal polyps, underwent capsule endoscopy showing tiny gastric AVMs versus localized gastritis no further bleeding and Eliquis resumed. Seen by nephrology continued HD MWF.  BP has been soft and meds  being adjusted. At this time complete workup, he is waiting for placement. Per social worker patient must be able to tolerate sitting for outpatient HD before he can return to SNF.  11/21/2022: Patient seen.  No new complaints.  Patient is awaiting disposition.  Patient has been able to see on the chair for over 1 hour.  The goal is to have patient sit for about 5 hours.  Nephrology input is highly appreciated.  11/22/2022: Patient was hypertensive overnight, and has remained intermittently hypotensive.  Echocardiogram done on 06/13/2021 revealed RVSP of 51.6 mmHg.  Patient blood pressure may be volume dependent.  Nephrology team has kindly agreed to look into estimated dry weight.  Will repeat echocardiogram today.  Will have a low threshold to consult cardiology team as patient's pulmonary hypertension may be significant.  Will follow echocardiogram report.   Subjective: No chest pain. No shortness of breath.    Assessment and Plan: Principal Problem:   Syncope Active Problems:   DM (diabetes mellitus) (HCC)   Acute on chronic blood loss anemia   ESRD on hemodialysis (HCC)   Hypotension   Acute GI bleeding   Acute encephalopathy   Acute blood loss anemia   Melena   History of colonic polyps   ESRD (end stage renal disease) on dialysis (HCC)   Gastric nodule   Acute toxic metabolic encephalopathy Syncope: Completed workup with CT head, TSH, EEG, ABG overall unremarkable besides mild hypercapnia. Mentation - bit but able to communicate and move all his extremities . Cont PT OT and Pending SNF. 11/22/2022: Resolved.  Patient is awaiting disposition.   Acute GI bleed, likely upper Acute on chronic anemia secondary to blood loss: B/l HB 7.9-10.3 g, had melena on rectal exam FOBT positive so underwent GI  eval- EGD done last month showing normal esophagus, hiatal hernia, single submucosal papule (nodule) found in the stomach, and 2 duodenal polyps. S/P Capsule STUDY:tiny gatric AVM vs mild  localized gastritis vs healing from previous biopsy site without bleeding. S/P1 PRBC, no further GI workup>continue PPI. Recs for outpt EUS.  Hemoglobin stable as below Recent Labs    09/11/22 0616 09/12/22 0444 11/14/22 0144 11/18/22 0107 11/19/22 0146 11/20/22 0808 11/21/22 2203  HGB 8.4*   < > 8.1* 8.0* 8.1* 8.0* 8.3*  MCV 105.6*   < > 103.7* 100.8* 101.5* 102.7*  --   VITAMINB12 848  --   --   --   --   --   --   FOLATE 25.6  --   --   --   --   --   --   FERRITIN 1,126*  --   --   --   --   --   --   TIBC 200*  --   --   --   --   --   --   IRON 54  --   --   --   --   --   --   RETICCTPCT 3.8*  --   --   --   --   --   --    < > = values in this interval not displayed.      Hypotension chronic: Continue midodrine. PAF on Eliquis Sinus tachycardia: Heart rate in the 110s hemodialysis today > if persistent can consider decreasing midodrine if BP stable.  11/22/2022: See above documentation.  Pneumonia:cxr-worsening bilateral lower lung airspace disease which may be due to atelectasis or pneumonia, possible small left effusion, and mild cardiomegaly.  No fever or leukocytosis.  Completed antibiotics, unable to wean off oxygen desaturated to mid 80s while asleep on room air continue supplemental oxygen need sleep study as outpatient.  Recent admission at [facility] 4/2-4/23 for acute cholecystitis/septic shock and NSTEMI.  He was initially in the ICU requiring vasopressor support.  General surgery had decided against surgical intervention due to patient's history of aortic stenosis.  He was treated with antibiotics and underwent cholecystectomy tube placement by IR. Cont drain care  ESRD and HD nephrology managing.  Last HD 5/10 Type 2 diabetes mellitus stable A1c 6.5 change SSI achs Recent Labs  Lab 11/21/22 0634 11/21/22 1248 11/21/22 2132 11/22/22 0553 11/22/22 1343  GLUCAP 67* 105* 116* 99 132*   11/21/2022: Continue CKD Monday Wednesday Friday.  Peripheral neuropathy:  Reports taking Lyrica 75 mg 2 pills a day currently taking 75 mg once a day in the setting of delirium, will consider resuming home dose ( but bit slow today so will not resume 2 tabs), reports she has not been able to work since December previously used to walk walker and crutches, has left leg weakness  Deconditioning debility in the setting of multiple medical issues dialysis dependent with peripheral neuropathy: Continue PT OT. Having foot pain>ordered Darco boot 5/8>  he has not been able to walk, has not been able to use chair for HD.   QT prolongation monitor  Hyponatremia: -In the setting of ESRD monitor. -Likely volume related. -Repeat labs in the morning.  Last renal panel was on 11/20/2022.  Class I Obesity:Patient's Body mass index is 30.59 kg/m. : Will benefit with PCP follow-up, weight loss  healthy lifestyle and outpatient sleep evaluation.   DVT prophylaxis: SCDs Start: 11/09/22 0136 Code Status:   Code Status: Full Code  Family Communication: plan of care discussed with patient/RN at bedside. Patient status is: Inpatient because of awaiting skilled nursing facility Level of care: Progressive  Dispo: The patient is from: snf            Anticipated disposition: snf  Objective: Vitals last 24 hrs: Vitals:   11/22/22 1200 11/22/22 1300 11/22/22 1400 11/22/22 1500  BP: (!) 85/52 99/67 92/60  (!) 89/52  Pulse:    (!) 111  Resp: 19 18  17   Temp:      TempSrc:      SpO2:    95%  Weight:       Weight change:   Physical Examination: General exam: Patient is not in any distress.  Patient is awake and alert.   HEENT: Patient is pale.  No jaundice.   Respiratory system: Clear to auscultation.   Cardiovascular system: S1 & S2 . Gastrointestinal system: Abdomen soft, rt abd drain+NT,ND,BS+ Nervous System: Patient is awake and alert.  Patient moves all extremities.   Medications reviewed:  Scheduled Meds:  acetaminophen  650 mg Oral Once   apixaban  5 mg Oral BID    [START ON 11/23/2022] calcitRIOL  0.75 mcg Oral Q M,W,F-HD   Chlorhexidine Gluconate Cloth  6 each Topical Q0600   cinacalcet  30 mg Oral Q supper   darbepoetin (ARANESP) injection - DIALYSIS  100 mcg Subcutaneous Q Sat-1800   insulin aspart  0-9 Units Subcutaneous TID WC   midodrine  15 mg Oral TID WC   pantoprazole  40 mg Oral BID   polyethylene glycol  17 g Oral Once   pregabalin  75 mg Oral QHS   sucroferric oxyhydroxide  500 mg Oral TID WC   Continuous Infusions:    Diet Order             Diet heart healthy/carb modified Room service appropriate? No; Fluid consistency: Thin  Diet effective now                   Intake/Output Summary (Last 24 hours) at 11/22/2022 1739 Last data filed at 11/22/2022 1600 Gross per 24 hour  Intake --  Output 50 ml  Net -50 ml    Net IO Since Admission: -7,896.46 mL [11/22/22 1739]  Wt Readings from Last 3 Encounters:  11/21/22 96.7 kg  09/25/22 96 kg  09/22/22 96.6 kg     Unresulted Labs (From admission, onward)     Start     Ordered   11/22/22 0605  Blood gas, arterial  ONCE - STAT,   STAT        11/22/22 0604   Signed and Held  Renal function panel  Once,   R       Question:  Specimen collection method  Answer:  Lab=Lab collect   Signed and Held   Signed and Held  Renal function panel  Tomorrow morning,   R       Question:  Specimen collection method  Answer:  Lab=Lab collect   Signed and Held   Signed and Held  CBC  Tomorrow morning,   R       Question:  Specimen collection method  Answer:  Lab=Lab collect   Signed and Held          Data Reviewed: I have personally reviewed following labs and imaging studies CBC: Recent Labs  Lab 11/18/22 0107 11/19/22 0146 11/20/22 0808 11/21/22 2203  WBC 7.9 7.5 7.5  --   HGB 8.0* 8.1* 8.0*  8.3*  HCT 25.6* 26.9* 26.7* 27.5*  MCV 100.8* 101.5* 102.7*  --   PLT 284 290 289  --     Basic Metabolic Panel: Recent Labs  Lab 11/16/22 0114 11/18/22 0107 11/19/22 0146  11/20/22 0808  NA 134* 134* 134* 135  K 4.1 3.8 3.8 4.1  CL 97* 97* 98 99  CO2 28 29 30 28   GLUCOSE 113* 96 87 80  BUN 25* 22 14 19   CREATININE 7.31* 6.14* 4.48* 6.21*  CALCIUM 8.3* 8.4* 8.3* 8.6*  PHOS  --   --   --  3.2    GFR: Estimated Creatinine Clearance: 11.4 mL/min (A) (by C-G formula based on SCr of 6.21 mg/dL (H)). Liver Function Tests: Recent Labs  Lab 11/16/22 0114 11/18/22 0107 11/20/22 0808  AST 18 18  --   ALT 20 18  --   ALKPHOS 77 76  --   BILITOT 0.8 0.8  --   PROT 7.0 6.8  --   ALBUMIN 2.0* 1.9* 2.0*   CBG: Recent Labs  Lab 11/21/22 0634 11/21/22 1248 11/21/22 2132 11/22/22 0553 11/22/22 1343  GLUCAP 67* 105* 116* 99 132*    No results found for this or any previous visit (from the past 240 hour(s)).  Antimicrobials: Anti-infectives (From admission, onward)    Start     Dose/Rate Route Frequency Ordered Stop   11/11/22 1200  vancomycin (VANCOCIN) IVPB 1000 mg/200 mL premix        1,000 mg 200 mL/hr over 60 Minutes Intravenous Every M-W-F (Hemodialysis) 11/09/22 1523 11/13/22 1429   11/09/22 1615  vancomycin (VANCOREADY) IVPB 2000 mg/400 mL        2,000 mg 200 mL/hr over 120 Minutes Intravenous  Once 11/09/22 1521 11/10/22 0212   11/09/22 1600  ceFEPIme (MAXIPIME) 1 g in sodium chloride 0.9 % 100 mL IVPB        1 g 200 mL/hr over 30 Minutes Intravenous Every 24 hours 11/09/22 1459 11/13/22 2122   11/09/22 1545  linezolid (ZYVOX) IVPB 600 mg  Status:  Discontinued        600 mg 300 mL/hr over 60 Minutes Intravenous Every 12 hours 11/09/22 1500 11/09/22 1519     Culture/Microbiology    Component Value Date/Time   SDES ABSCESS 06/15/2022 1344   SPECREQUEST PERIRECTAL 06/15/2022 1344   CULT  06/15/2022 1344    FEW ESCHERICHIA COLI NO ANAEROBES ISOLATED Performed at [facility], [address] [place], [place] [address]    REPTSTATUS 06/20/2022 FINAL 06/15/2022 1344  Radiology Studies: No results found.   LOS: 13 days    Barnetta Chapel, MD [facility]  11/22/2022, 5:39 PM

## 2022-11-22 NOTE — Progress Notes (Signed)
[facility] Progress Note   Subjective:  Seen in room, drowsy in bed - wakes easily and will answer questions, but does not open eyes. He does not provide any specific symptoms. Hypotensive all night, was given NS bolus. Due for midodrine - will raise dose, as below. He did sit in recliner in room for 4.5hr yesterday per RN notes.  Objective Vitals:   11/22/22 0602 11/22/22 0615 11/22/22 0630 11/22/22 0645  BP: (!) 78/53 (!) 85/65 (!) 88/56 (!) 82/41  Pulse:      Resp: 16 20 20 17   Temp:      TempSrc:      SpO2:      Weight:       Physical Exam General: Chronically ill appearing man, drowsy. Nasal O2 in place. Heart: RRR; 2/6 murmur Lungs: CTA anteriorly Abdomen: soft Extremities: no LE edema; R foot in brace Dialysis Access:  LUE AVF + bruit  Additional Objective Labs: Basic Metabolic Panel: Recent Labs  Lab 11/18/22 0107 11/19/22 0146 11/20/22 0808  NA 134* 134* 135  K 3.8 3.8 4.1  CL 97* 98 99  CO2 29 30 28   GLUCOSE 96 87 80  BUN 22 14 19   CREATININE 6.14* 4.48* 6.21*  CALCIUM 8.4* 8.3* 8.6*  PHOS  --   --  3.2   Liver Function Tests: Recent Labs  Lab 11/16/22 0114 11/18/22 0107 11/20/22 0808  AST 18 18  --   ALT 20 18  --   ALKPHOS 77 76  --   BILITOT 0.8 0.8  --   PROT 7.0 6.8  --   ALBUMIN 2.0* 1.9* 2.0*   CBC: Recent Labs  Lab 11/18/22 0107 11/19/22 0146 11/20/22 0808 11/21/22 2203  WBC 7.9 7.5 7.5  --   HGB 8.0* 8.1* 8.0* 8.3*  HCT 25.6* 26.9* 26.7* 27.5*  MCV 100.8* 101.5* 102.7*  --   PLT 284 290 289  --    Medications:   acetaminophen  650 mg Oral Once   apixaban  5 mg Oral BID   [START ON 11/23/2022] calcitRIOL  0.75 mcg Oral Q M,W,F-HD   Chlorhexidine Gluconate Cloth  6 each Topical Q0600   cinacalcet  30 mg Oral Q supper   darbepoetin (ARANESP) injection - DIALYSIS  100 mcg Subcutaneous Q Sat-1800   insulin aspart  0-9 Units Subcutaneous TID WC   midodrine  15 mg Oral TID WC   pantoprazole  40 mg Oral  BID   polyethylene glycol  17 g Oral Once   pregabalin  75 mg Oral QHS   sucroferric oxyhydroxide  500 mg Oral TID WC    Dialysis Orders: [facility] M[facility] 3h  450/600  99kg  2/2.5 bath  LUA AVF  Hep none - last HD 4/26, post wt 111kg, coming off 10+kg over last 2 sessions - rocaltrol 0.75 mcg po tiw - venofer 100mg  tiw thru 5/08 - mircera 200 mcg IV q 2 wks, last 4/26 - last Hb 7.7 on 4/24   Assessment/Plan: LOC episode/ AMS - had brief CPR x 4 min at SNF before EMS arrived. Was somnolent here x 24 hrs then improved the next day. ABG showed pCO2 of 57. AMS has resolved.  Pneumonia: S/p course of antibiotics here. On O2 via [place] for desaturations at night. Acute upper GIB - melena on initial ED exam, recent EGD showed duodenal polyps. Pt rec'd 1u prbcs and GI did capsule endo showing tiny gastric AVMs vs localized gastritis. No further bleeding.  Eliquis resumed. Acute hypoxic resp failure/ vol overload - pt admitted 10kg over dry wt w/ diffuse edema on exam. Now below prior EDW - continue to challenge as tolerated. 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 Next HD Monday 5/13. Was supposed to dialyze in recliner -> didn't happen, but he was able to sit in recliner in room for 4.5hr on 5/11. BP: Hypotensive again today - continue midodrine -> raise dose to 15mg  TID. Parox afib - rate currently controlled. Beta blocker on hold due to hypotension.  Anemia of ESRD: Hgb 8.3 - Aranesp given on 5/11, follow.  Secondary HPTH: CorrCa/Phos to goal. Cont po vdra, sensipar qd and velphoro as binder.  Dispo - pending dc to SNF     Ozzie Hoyle, PA-C 11/22/2022, 9:07 AM  [employer]

## 2022-11-22 NOTE — Plan of Care (Signed)

## 2022-11-23 ENCOUNTER — Telehealth: Payer: Self-pay

## 2022-11-23 ENCOUNTER — Inpatient Hospital Stay (HOSPITAL_COMMUNITY): Payer: Medicare Other

## 2022-11-23 ENCOUNTER — Other Ambulatory Visit (HOSPITAL_COMMUNITY): Payer: Medicare Other

## 2022-11-23 ENCOUNTER — Other Ambulatory Visit: Payer: Self-pay

## 2022-11-23 DIAGNOSIS — R55 Syncope and collapse: Secondary | ICD-10-CM | POA: Diagnosis not present

## 2022-11-23 DIAGNOSIS — D62 Acute posthemorrhagic anemia: Secondary | ICD-10-CM | POA: Diagnosis not present

## 2022-11-23 DIAGNOSIS — E1159 Type 2 diabetes mellitus with other circulatory complications: Secondary | ICD-10-CM

## 2022-11-23 DIAGNOSIS — N186 End stage renal disease: Secondary | ICD-10-CM | POA: Diagnosis not present

## 2022-11-23 DIAGNOSIS — I959 Hypotension, unspecified: Secondary | ICD-10-CM

## 2022-11-23 DIAGNOSIS — G9340 Encephalopathy, unspecified: Secondary | ICD-10-CM

## 2022-11-23 DIAGNOSIS — Z86010 Personal history of colonic polyps: Secondary | ICD-10-CM

## 2022-11-23 LAB — RENAL FUNCTION PANEL
Albumin: 2 g/dL — ABNORMAL LOW (ref 3.5–5.0)
Anion gap: 6 (ref 5–15)
BUN: 22 mg/dL (ref 8–23)
CO2: 28 mmol/L (ref 22–32)
Calcium: 8.7 mg/dL — ABNORMAL LOW (ref 8.9–10.3)
Chloride: 99 mmol/L (ref 98–111)
Creatinine, Ser: 6.87 mg/dL — ABNORMAL HIGH (ref 0.61–1.24)
GFR, Estimated: 8 mL/min — ABNORMAL LOW (ref >60)
Glucose, Bld: 99 mg/dL (ref 70–99)
Phosphorus: 2.9 mg/dL (ref 2.5–4.6)
Potassium: 4.5 mmol/L (ref 3.5–5.1)
Sodium: 133 mmol/L — ABNORMAL LOW (ref 135–145)

## 2022-11-23 LAB — CBC WITH DIFFERENTIAL/PLATELET
Abs Immature Granulocytes: 0.03 10*3/uL (ref 0.00–0.07)
Basophils Absolute: 0.1 10*3/uL (ref 0.0–0.1)
Basophils Relative: 1 %
Eosinophils Absolute: 0.1 10*3/uL (ref 0.0–0.5)
Eosinophils Relative: 2 %
HCT: 28.2 % — ABNORMAL LOW (ref 39.0–52.0)
Hemoglobin: 8.4 g/dL — ABNORMAL LOW (ref 13.0–17.0)
Immature Granulocytes: 0 %
Lymphocytes Relative: 13 %
Lymphs Abs: 0.9 10*3/uL (ref 0.7–4.0)
MCH: 29.8 pg (ref 26.0–34.0)
MCHC: 29.8 g/dL — ABNORMAL LOW (ref 30.0–36.0)
MCV: 100 fL (ref 80.0–100.0)
Monocytes Absolute: 1.1 10*3/uL — ABNORMAL HIGH (ref 0.1–1.0)
Monocytes Relative: 16 %
Neutro Abs: 4.8 10*3/uL (ref 1.7–7.7)
Neutrophils Relative %: 68 %
Platelets: 292 10*3/uL (ref 150–400)
RBC: 2.82 MIL/uL — ABNORMAL LOW (ref 4.22–5.81)
RDW: 16.3 % — ABNORMAL HIGH (ref 11.5–15.5)
WBC: 7 10*3/uL (ref 4.0–10.5)
nRBC: 0 % (ref 0.0–0.2)

## 2022-11-23 LAB — GLUCOSE, CAPILLARY
Glucose-Capillary: 82 mg/dL (ref 70–99)
Glucose-Capillary: 126 mg/dL — ABNORMAL HIGH (ref 70–99)
Glucose-Capillary: 75 mg/dL (ref 70–99)

## 2022-11-23 LAB — MAGNESIUM: Magnesium: 1.8 mg/dL (ref 1.7–2.4)

## 2022-11-23 NOTE — Progress Notes (Signed)
  Echocardiogram 2D Echocardiogram attempted at 1415. Pt said he is getting ready to go to dialysis. Will attempt again at a later time.  Milda Smart 11/23/2022, 2:16 PM

## 2022-11-23 NOTE — Procedures (Signed)
HD Note:  Some information was entered later than the data was gathered due to patient care needs. The stated time with the data is accurate.  Received patient in bed to unit.  Alert and oriented.  Informed consent signed and in chart.   Patient had no issues with treatment up to the point there was a transition from this nurse to the oncoming dialysis  nurse   Access used: Left arm AVF Access issues: None        [facility]

## 2022-11-23 NOTE — Telephone Encounter (Signed)
Patient hospitalized. Appointment rescheduled to 03/01/23 at 3:20 pm. Letter mailed.

## 2022-11-23 NOTE — Progress Notes (Signed)
[facility] Progress Note   Subjective:    Seen and examined patient at bedside. Opens his eyes with stimulation but quickly goes back to sleep. Doesn't provide specific symptoms. Noted Midodrine dose recently raised to 15mg  TID. SBP ranging 90-100s. Plan for HD today.  Objective Vitals:   11/22/22 2300 11/23/22 0400 11/23/22 0919 11/23/22 0935  BP: (!) 97/50 97/75 110/77   Pulse: (!) 112 (!) 107 (!) 111 98  Resp: 18     Temp:  98.2 F (36.8 C) (!) 97.4 F (36.3 C)   TempSrc: Oral Oral Oral   SpO2: 100% 100% 100% 100%  Weight:       Physical Exam General: Chronically ill appearing man, drowsy. Nasal O2 in place. Heart: RRR; 2/6 murmur Lungs: CTA anteriorly Abdomen: soft Extremities: no LE edema; R foot in brace Dialysis Access:  LUE AVF + bruit  Filed Weights   11/18/22 1131 11/20/22 0950 11/21/22 0300  Weight: 100.8 kg 100.8 kg 96.7 kg    Intake/Output Summary (Last 24 hours) at 11/23/2022 1128 Last data filed at 11/23/2022 0500 Gross per 24 hour  Intake --  Output 175 ml  Net -175 ml    Additional Objective Labs: Basic Metabolic Panel: Recent Labs  Lab 11/19/22 0146 11/20/22 0808 11/23/22 0137  NA 134* 135 133*  K 3.8 4.1 4.5  CL 98 99 99  CO2 30 28 28   GLUCOSE 87 80 99  BUN 14 19 22   CREATININE 4.48* 6.21* 6.87*  CALCIUM 8.3* 8.6* 8.7*  PHOS  --  3.2 2.9   Liver Function Tests: Recent Labs  Lab 11/18/22 0107 11/20/22 0808 11/23/22 0137  AST 18  --   --   ALT 18  --   --   ALKPHOS 76  --   --   BILITOT 0.8  --   --   PROT 6.8  --   --   ALBUMIN 1.9* 2.0* 2.0*   No results for input(s): "LIPASE", "AMYLASE" in the last 168 hours. CBC: Recent Labs  Lab 11/18/22 0107 11/19/22 0146 11/20/22 0808 11/21/22 2203 11/23/22 0137  WBC 7.9 7.5 7.5  --  7.0  NEUTROABS  --   --   --   --  4.8  HGB 8.0* 8.1* 8.0* 8.3* 8.4*  HCT 25.6* 26.9* 26.7* 27.5* 28.2*  MCV 100.8* 101.5* 102.7*  --  100.0  PLT 284 290 289  --  292   Blood  Culture    Component Value Date/Time   SDES ABSCESS 06/15/2022 1344   SPECREQUEST PERIRECTAL 06/15/2022 1344   CULT  06/15/2022 1344    FEW ESCHERICHIA COLI NO ANAEROBES ISOLATED Performed at [facility], [address], [place], [place] [address]    REPTSTATUS 06/20/2022 FINAL 06/15/2022 1344    Cardiac Enzymes: No results for input(s): "CKTOTAL", "CKMB", "CKMBINDEX", "TROPONINI" in the last 168 hours. CBG: Recent Labs  Lab 11/22/22 0553 11/22/22 1343 11/22/22 1752 11/22/22 2102 11/23/22 0543  GLUCAP 99 132* 102* 93 82   Iron Studies: No results for input(s): "IRON", "TIBC", "TRANSFERRIN", "FERRITIN" in the last 72 hours. Lab Results  Component Value Date   INR 1.3 (H) 09/10/2022   INR 1.1 05/27/2020   INR 1.3 (H) 01/28/2020   Studies/Results: No results found.  Medications:   acetaminophen  650 mg Oral Once   apixaban  5 mg Oral BID   calcitRIOL  0.75 mcg Oral Q M,W,F-HD   Chlorhexidine Gluconate Cloth  6 each Topical Q0600  cinacalcet  30 mg Oral Q supper   darbepoetin (ARANESP) injection - DIALYSIS  100 mcg Subcutaneous Q Sat-1800   insulin aspart  0-9 Units Subcutaneous TID WC   midodrine  15 mg Oral TID WC   pantoprazole  40 mg Oral BID   polyethylene glycol  17 g Oral Once   pregabalin  75 mg Oral QHS   sucroferric oxyhydroxide  500 mg Oral TID WC    Dialysis Orders: [facility] MWF 3h  450/600  99kg  2/2.5 bath  LUA AVF  Hep none - last HD 4/26, post wt 111kg, coming off 10+kg over last 2 sessions - rocaltrol 0.75 mcg po tiw - venofer 100mg  tiw thru 5/08 - mircera 200 mcg IV q 2 wks, last 4/26 - last Hb 7.7 on 4/24  Assessment/Plan: LOC episode/ AMS - had brief CPR x 4 min at SNF before EMS arrived. Was somnolent here x 24 hrs then improved the next day. Appears somnolent today though. ABG showed pCO2 of 57.  Pneumonia: S/p course of antibiotics here. On O2 via [place] for desaturations at night. Acute upper GIB - melena on initial ED exam,  recent EGD showed duodenal polyps. Pt rec'd 1u prbcs and GI did capsule endo showing tiny gastric AVMs vs localized gastritis. No further bleeding. Eliquis resumed. Acute hypoxic resp failure/ vol overload - pt admitted 10kg over dry wt w/ diffuse edema on exam. Now below prior EDW - continue to challenge as tolerated. 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 Next HD Monday 5/13. Was supposed to dialyze in recliner -> didn't happen, but he was able to sit in recliner in room for 4.5hr on 5/11. BP: Hypotensive again today - continue midodrine -> dose recently raised to 15mg  TID. Parox afib - rate currently controlled. Beta blocker on hold due to hypotension.  Anemia of ESRD: Hgb 8.4 - Aranesp given on 5/11, follow.  Secondary HPTH: CorrCa/Phos to goal. Cont po vdra, sensipar qd and velphoro as binder.  Dispo - pending dc to SNF   Salome Holmes, NP [facility] 11/23/2022,11:28 AM  LOS: 14 days

## 2022-11-23 NOTE — Care Management Important Message (Signed)
Important Message  Patient Details  Name: Justin [name] MRN: [id number] Date of Birth: 1946-03-04   Medicare Important Message Given:  Yes     Renie Ora 11/23/2022, 12:07 PM

## 2022-11-23 NOTE — Progress Notes (Signed)
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  Patient did have CPR done at the skilled nursing facility prior to transfer to the hospital.  Of note, patient was recently admitted to [facility] 4/2-4/23 for acute cholecystitis/septic shock and NSTEMI.  He was initially in the ICU requiring vasopressor support.  General surgery had decided against surgical intervention due to patient's history of aortic stenosis.  He was treated with antibiotics and had underwent cholecystostomy tube placement by IR.  Ultimately discharged to SNF with cholecystectomy tube in place.  Patient was on supplemental oxygen at the time of discharge. Per review of discharge summary, had biopsy of stomach/fundus mass done at [facility] and reportedly results inconclusive per pathologist who recommended deeper biopsy samples in the future.  At the skilled nursing facility patient did have a syncopal episode and was found to be hypoxic with hemoglobin of 7.4.  Chest x-ray showed worsening bilateral airspace opacity.  Patient was somnolent.  During hospitalization patient was treated for pneumonia, and underwent to EGD for upper GI bleed had 1 unit of packed RBC.  Patient was noted to have duodenal polyps and underwent capsule endoscopy with tiny AVM versus localized gastritis.  No further bleeding was noted and Eliquis was resumed.   2 D Echocardiogram done on  06/13/2021 revealed RVSP of 51.6 mmHg.  Patient was seen by nephrology during hospitalization and underwent hemodialysis Monday Wednesday Friday.  Currently awaiting for outpatient hemodialysis prior to going to skilled nursing facility.   Assessment and Plan:   Acute toxic metabolic encephalopathy Syncope: CT head, TSH, EEG, ABG overall unremarkable besides mild hypercapnia.  Improved encephalopathy at this time.   Acute GI bleed,  Acute on chronic anemia secondary to blood loss: Seen and melena on rectal exam with FOBT positive.  GI saw the patient and had EGD done last month with  normal esophagus, hiatal hernia, single submucosal papule (nodule) found in the stomach, and 2 duodenal polyps.  Status post capsule STUDY:tiny gatric AVM vs mild localized gastritis vs healing from previous biopsy site without bleeding.  During hospitalization patient received 1 unit of PRBC, no further GI workup at this time.  Recommendation for outpatient endoscopic ultrasound.  Continue PPI.     Latest Ref Rng & Units 11/23/2022    1:37 AM 11/21/2022   10:03 PM 11/20/2022    8:08 AM  CBC  WBC 4.0 - 10.5 K/uL 7.0   7.5   Hemoglobin 13.0 - 17.0 g/dL 8.4  8.3  8.0   Hematocrit 39.0 - 52.0 % 28.2  27.5  26.7   Platelets 150 - 400 K/uL 292   289        Hypotension chronic: Continue midodrine.  PAF on Eliquis   Pneumonia: Completed course of antibiotic.  Currently on room air.  Recent admission at [facility] 4/2-4/23  for acute cholecystitis/septic shock and NSTEMI.  Currently on cholecystostomy tube.   ESRD and HD nephrology on board.  Type 2 diabetes mellitus thinly sliding scale insulin, Accu-Cheks,  diabetic diet.  Hemoglobin A1c of 6.5.   Peripheral neuropathy:  Takes Lyrica 75 milligram twice daily at home.  Currently on 75 daily.  Gradually increase as tolerated.    Deconditioning debility, ambulatory dysfunction.   Continue PT OT.   limited ambulation and decreased ability to use hemodialysis  chair.    Hyponatremia: Mild.  Monitor   Class I Obesity: Body mass index is 30.59 kg/m.  Would benefit from weight loss as outpatient.     DVT prophylaxis: SCDs Start: 11/09/22 0136 apixaban (ELIQUIS) tablet 5 mg   Code Status:     Code Status: Full Code  Disposition: Skilled nursing facility.  Status is: Inpatient  Remains inpatient appropriate because: Awaiting for skilled nursing facility placement,   Family Communication: None at bedside  Consultants:  Nephrology,  Procedures:  PRBC transfusion 1 unit.  Antimicrobials:  None  Anti-infectives (From admission, onward)    Start     Dose/Rate Route Frequency Ordered Stop   11/11/22 1200  vancomycin (VANCOCIN) IVPB 1000 mg/200 mL premix        1,000 mg 200 mL/hr over 60 Minutes Intravenous Every M-W-F (Hemodialysis) 11/09/22 1523 11/13/22 1429   11/09/22 1615  vancomycin (VANCOREADY) IVPB 2000 mg/400 mL        2,000 mg 200 mL/hr over 120 Minutes Intravenous  Once 11/09/22 1521 11/10/22 0212   11/09/22 1600  ceFEPIme (MAXIPIME) 1 g in sodium chloride 0.9 % 100 mL IVPB        1 g 200 mL/hr over 30 Minutes Intravenous Every 24 hours 11/09/22 1459 11/13/22 2122   11/09/22 1545  linezolid (ZYVOX) IVPB 600 mg  Status:  Discontinued        600 mg 300 mL/hr over 60 Minutes Intravenous Every 12 hours 11/09/22 1500 11/09/22 1519       Subjective: Today, patient was seen and examined at bedside.  Patient states that he does have decreased appetite.  Has had bowel movements.  Denies overt pain.  Objective: Vitals:   11/22/22 2300 11/23/22 0400 11/23/22 0919 11/23/22 0935  BP: (!) 97/50 97/75 110/77   Pulse: (!) 112 (!) 107 (!) 111 98  Resp: 18     Temp:  98.2 F (36.8 C) (!) 97.4 F (36.3 C)   TempSrc: Oral Oral Oral   SpO2: 100% 100% 100% 100%  Weight:        Intake/Output Summary (Last 24 hours) at 11/23/2022 1331 Last data filed at 11/23/2022 0500 Gross per 24 hour  Intake --  Output 175 ml  Net -175 ml    Filed Weights   11/18/22 1131 11/20/22 0950 11/21/22 0300  Weight: 100.8 kg 100.8 kg 96.7 kg    Physical Examination: Body mass index is 30.59 kg/m.   General:  obese built, not in obvious distress, elderly male, mildly somnolent, on nasal cannula oxygen HENT:   No scleral pallor or icterus noted. Oral mucosa is moist.  Chest:  Clear breath sounds.  No crackles or wheezes.  CVS: S1 &S2 heard. No murmur.  Regular rate and rhythm. Abdomen: Soft, nontender, nondistended.  Right abdominal drain in place.  Bowel sounds are heard.   Extremities: No cyanosis, clubbing or edema.  Peripheral pulses are palpable. Psych: Alert, awake and oriented, normal mood CNS:  No cranial nerve deficits.  Power equal in all extremities.   Skin: Warm and dry.  No rashes noted.  Data Reviewed:   CBC: Recent Labs  Lab 11/18/22 0107 11/19/22 0146 11/20/22 7846 11/21/22 2203  11/23/22 0137  WBC 7.9 7.5 7.5  --  7.0  NEUTROABS  --   --   --   --  4.8  HGB 8.0* 8.1* 8.0* 8.3* 8.4*  HCT 25.6* 26.9* 26.7* 27.5* 28.2*  MCV 100.8* 101.5* 102.7*  --  100.0  PLT 284 290 289  --  292    Basic Metabolic Panel: Recent Labs  Lab 11/18/22 0107 11/19/22 0146 11/20/22 0808 11/23/22 0137  NA 134* 134* 135 133*  K 3.8 3.8 4.1 4.5  CL 97* 98 99 99  CO2 29 30 28 28   GLUCOSE 96 87 80 99  BUN 22 14 19 22   CREATININE 6.14* 4.48* 6.21* 6.87*  CALCIUM 8.4* 8.3* 8.6* 8.7*  MG  --   --   --  1.8  PHOS  --   --  3.2 2.9    Liver Function Tests: Recent Labs  Lab 11/18/22 0107 11/20/22 0808 11/23/22 0137  AST 18  --   --   ALT 18  --   --   ALKPHOS 76  --   --   BILITOT 0.8  --   --   PROT 6.8  --   --   ALBUMIN 1.9* 2.0* 2.0*     Radiology Studies: No results found.    LOS: 14 days    Joycelyn Das, MD [facility] Available via Epic secure chat 7am-7pm After these hours, please refer to coverage provider listed on [email or web address] 11/23/2022, 1:31 PM

## 2022-11-24 ENCOUNTER — Inpatient Hospital Stay (HOSPITAL_COMMUNITY): Payer: Medicare Other

## 2022-11-24 DIAGNOSIS — E1159 Type 2 diabetes mellitus with other circulatory complications: Secondary | ICD-10-CM | POA: Diagnosis not present

## 2022-11-24 DIAGNOSIS — I272 Pulmonary hypertension, unspecified: Secondary | ICD-10-CM

## 2022-11-24 DIAGNOSIS — N186 End stage renal disease: Secondary | ICD-10-CM | POA: Diagnosis not present

## 2022-11-24 DIAGNOSIS — D62 Acute posthemorrhagic anemia: Secondary | ICD-10-CM | POA: Diagnosis not present

## 2022-11-24 DIAGNOSIS — R55 Syncope and collapse: Secondary | ICD-10-CM | POA: Diagnosis not present

## 2022-11-24 LAB — ECHOCARDIOGRAM COMPLETE
AR max vel: 2.41 cm2
AV Area VTI: 2.54 cm2
AV Area mean vel: 2.64 cm2
AV Mean grad: 20.3 mmHg
AV Peak grad: 35.8 mmHg
Ao pk vel: 2.99 m/s
Area-P 1/2: 4.17 cm2
Calc EF: 56.3 %
Height: 70 in
S' Lateral: 3.1 cm
Single Plane A2C EF: 64.9 %
Single Plane A4C EF: 46.6 %
Weight: 246.92 oz

## 2022-11-24 LAB — BASIC METABOLIC PANEL
Anion gap: 8 (ref 5–15)
BUN: 13 mg/dL (ref 8–23)
CO2: 30 mmol/L (ref 22–32)
Calcium: 8.6 mg/dL — ABNORMAL LOW (ref 8.9–10.3)
Chloride: 97 mmol/L — ABNORMAL LOW (ref 98–111)
Creatinine, Ser: 4.39 mg/dL — ABNORMAL HIGH (ref 0.61–1.24)
GFR, Estimated: 13 mL/min — ABNORMAL LOW (ref >60)
Glucose, Bld: 89 mg/dL (ref 70–99)
Potassium: 4.1 mmol/L (ref 3.5–5.1)
Sodium: 135 mmol/L (ref 135–145)

## 2022-11-24 LAB — GLUCOSE, CAPILLARY
Glucose-Capillary: 73 mg/dL (ref 70–99)
Glucose-Capillary: 106 mg/dL — ABNORMAL HIGH (ref 70–99)
Glucose-Capillary: 103 mg/dL — ABNORMAL HIGH (ref 70–99)

## 2022-11-24 LAB — CBC
HCT: 28.5 % — ABNORMAL LOW (ref 39.0–52.0)
Hemoglobin: 8.5 g/dL — ABNORMAL LOW (ref 13.0–17.0)
MCH: 29.3 pg (ref 26.0–34.0)
MCHC: 29.8 g/dL — ABNORMAL LOW (ref 30.0–36.0)
MCV: 98.3 fL (ref 80.0–100.0)
Platelets: 256 10*3/uL (ref 150–400)
RBC: 2.9 MIL/uL — ABNORMAL LOW (ref 4.22–5.81)
RDW: 16.2 % — ABNORMAL HIGH (ref 11.5–15.5)
WBC: 6 10*3/uL (ref 4.0–10.5)
nRBC: 0 % (ref 0.0–0.2)

## 2022-11-24 LAB — MAGNESIUM: Magnesium: 1.6 mg/dL — ABNORMAL LOW (ref 1.7–2.4)

## 2022-11-24 MED ORDER — ACETAMINOPHEN 325 MG PO TABS: 650.0000 mg | ORAL_TABLET | Freq: Four times a day (QID) | ORAL | Status: DC | PRN

## 2022-11-24 MED ORDER — MAGNESIUM SULFATE 2 GM/50ML IV SOLN
2.0000 g | Freq: Once | INTRAVENOUS | Status: AC
  Administered 2022-11-24: 2 g via INTRAVENOUS
  Filled 2022-11-24: qty 50

## 2022-11-24 MED ORDER — TRAZODONE HCL 50 MG PO TABS: 50.0000 mg | ORAL_TABLET | Freq: Every evening | ORAL | Status: DC | PRN

## 2022-11-24 MED ORDER — CALCITRIOL 0.25 MCG PO CAPS: 0.7500 ug | ORAL_CAPSULE | ORAL | Status: DC

## 2022-11-24 MED ORDER — PREGABALIN 75 MG PO CAPS: 75.0000 mg | ORAL_CAPSULE | Freq: Every day | ORAL | 0 refills | Status: DC

## 2022-11-24 MED ORDER — PANTOPRAZOLE SODIUM 40 MG PO TBEC: 40.0000 mg | DELAYED_RELEASE_TABLET | Freq: Two times a day (BID) | ORAL | Status: DC

## 2022-11-24 MED ORDER — MIDODRINE HCL 5 MG PO TABS: 15.0000 mg | ORAL_TABLET | Freq: Three times a day (TID) | ORAL | Status: DC

## 2022-11-24 NOTE — Progress Notes (Signed)
Received patient in bed to unit.  Alert and oriented.  Informed consent signed and in chart. yes  TX duration:3hrs 45 minutes  Patient tolerated well. yes Transported back to the room yes Alert, without acute distress.  Hand-off given to patient's nurse.   Access used: left upper arm fistula Access issues: none  Total UF removed: 500 ml Medication(s) given: none Post HD VS: 100/75 Hr 113 Post HD weight: 96KG   [facility]

## 2022-11-24 NOTE — TOC Progression Note (Signed)
Transition of Care [facility]) - Progression Note    Patient Details  Name: Justin [name] MRN: [id number] Date of Birth: [birth year]  Transition of Care [facility]) CM/SW Contact  Erin Sons, [place] Phone Number: 11/24/2022, 1:24 PM  Clinical Narrative:     Informed pt is able to sit for duration of HD. [facility] can accept pt back pending insurance auth.   Auth status: pending    Expected Discharge Plan: Skilled Nursing Facility Barriers to Discharge: Continued Medical Work up (must be able to tolerate sitting for OP HD Clinic before returning to SNF)  Expected Discharge Plan and Services                                               Social Determinants of Health (SDOH) Interventions SDOH Screenings   Food Insecurity: No Food Insecurity (11/24/2022)  Housing: Low Risk  (11/24/2022)  Transportation Needs: No Transportation Needs (11/24/2022)  Utilities: Not At Risk (11/24/2022)  Tobacco Use: Medium Risk (11/12/2022)    Readmission Risk Interventions    11/14/2022   10:08 AM 09/18/2022    4:10 PM 06/17/2022    2:47 PM  Readmission Risk Prevention Plan  Transportation Screening Complete Complete Complete  HRI or Home Care Consult   Complete  Social Work Consult for Recovery Care Planning/Counseling   Complete  Palliative Care Screening   Not Applicable  Medication Review [employer]) Complete Complete Complete  PCP or Specialist appointment within 3-5 days of discharge Complete Complete   HRI or Home Care Consult Complete Complete   SW Recovery Care/Counseling Consult Complete    Palliative Care Screening Not Applicable Complete   Skilled Nursing Facility Complete Not Applicable

## 2022-11-24 NOTE — Progress Notes (Signed)
Report given to RN Tish of [employer] questions were answered

## 2022-11-24 NOTE — Plan of Care (Signed)
[facility] Patient Discharge Orders- [facility] CLINIC: [facility]/DC to SNF  Patient's name: Justin [name] Admit/DC Dates: 11/08/2022 - 11/24/22  Discharge Diagnoses: Acute on chronic metabolic encephalopathy/syncope: CT head, TSH, EEG, and ABG besides mild hypercapnia Acute GI bleed: s/p EGD last month and s/p capsule study. Continue PPI BID Chronic hypotension: on Midodrine 15mg  TID PNA: s/p ABXs given here  Please note: patient tolerated being in the chair while here on 5/11 and 5/13.  Aranesp: Given: Yes  Date and amount of last dose: given on 11/21/22  Last Hgb: 8.5 PRBC's Given: 1 units PRBCs Date/# of units: 1 ESA dose for discharge: mircera 200 mcg IV q 2 weeks  IV Iron dose at discharge: Resume short Fe load  Heparin change: N/A  EDW Change: Yes New EDW: Lower 96.5kg  Bath Change: No  Access intervention/Change: No Details:  Calcitriol change: No  Discharge Labs: Calcium 8.6 Phosphorus 2.9 Albumin 2.0 K+ 4.1  IV Antibiotics: No  On Coumadin?: No   OTHER/APPTS/LAB ORDERS: 2GM IV Magnesium sulfate given 5/14 for Mg level 1.6. Re-check Magnesium level at next HD    D/C Meds to be reconciled by nurse after every discharge.  Completed By: Salome Holmes, NP-C 11/24/2022, 6:43 PM  [facility] Pager: [phone]  Reviewed by: MD:______ RN_______

## 2022-11-24 NOTE — Discharge Summary (Signed)
Physician Discharge Summary  Justin [name]:[id number] DOB: 08/18/1945 DOA: 11/08/2022  PCP: [name], DO  Admit date: 11/08/2022 Discharge date: 11/24/2022  Admitted From: Skilled nursing facility  Discharge disposition: Skilled nursing facility   Recommendations for Outpatient Follow-Up:   Follow up with your primary care provider at the skilled nursing facility in 3 to 5 days.  Check CBC, BMP, magnesium in the next visit Follow-up with GI as outpatient. Will need to follow-up with GI/[facility] for management of her cholecystostomy tube.  Discharge Diagnosis:   Principal Problem:   Syncope Active Problems:   Acute on chronic blood loss anemia   ESRD on hemodialysis (HCC)   DM (diabetes mellitus) (HCC)   Hypotension   Acute GI bleeding   Acute encephalopathy   Acute blood loss anemia   Melena   History of colonic polyps   ESRD (end stage renal disease) on dialysis (HCC)   Gastric nodule  Discharge Condition: Improved.  Diet recommendation: Low sodium, heart healthy.  Carbohydrate-modified.  Wound care: None.  Code status: Full.   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  Patient did have CPR done at the skilled nursing facility prior to transfer to the hospital.  Of note, patient was recently admitted to [facility] 4/2-4/23 for acute cholecystitis/septic shock and NSTEMI.  He was initially in the ICU requiring vasopressor support.  General surgery had decided against  surgical intervention due to patient's history of aortic stenosis.  He was treated with antibiotics and had underwent cholecystostomy tube placement by IR.  Ultimately discharged to SNF with cholecystectomy tube in place.  Patient was on supplemental oxygen at the time of discharge. Per review of discharge summary, had biopsy of stomach/fundus mass done at [facility] and reportedly results inconclusive per pathologist who recommended deeper biopsy samples in the future.    At the skilled nursing facility, patient did have a syncopal episode and was found to be hypoxic with hemoglobin of 7.4.  Chest x-ray showed worsening bilateral airspace opacity.  Patient was somnolent.  During hospitalization patient was treated for pneumonia, and underwent to EGD for upper GI bleed had 1 unit of packed RBC.  Patient was noted to have duodenal polyps and underwent capsule endoscopy with tiny AVM versus localized gastritis.  No further bleeding was noted and Eliquis was resumed.   2 D Echocardiogram done on 06/13/2021 revealed RVSP of 51.6 mmHg.  Patient was seen by nephrology during hospitalization and underwent hemodialysis Monday Wednesday Friday.    Hospital Course:   Following conditions were addressed during hospitalization as listed below,  Acute toxic metabolic encephalopathy Syncope: CT head, TSH, EEG, ABG overall unremarkable besides mild hypercapnia.  Patient was encouraged to sleep in the nighttime.    Acute GI bleed,  Acute on chronic anemia secondary to blood loss: Patient had melena on rectal exam with FOBT positive.  GI saw the patient and had EGD done last month with  normal esophagus, hiatal hernia, single submucosal papule (nodule) found in the stomach, and 2 duodenal polyps.  Status post capsule STUDY:tiny gatric AVM vs mild  localized gastritis vs healing from previous biopsy site without bleeding.  During hospitalization, patient received 1 unit of PRBC, no further GI workup at this time.    Continue PPI twice daily on discharge..  Latest hemoglobin at 8.5 from 8.4 recommend outpatient hemoglobin monitoring.  Will discontinue aspirin.  Patient will resume Eliquis on discharge.    Hypotension chronic: Continue midodrine closely on hemodialysis days.Marland Kitchen   PAF on Eliquis discontinue aspirin.   Pneumonia: Completed course of antibiotic.  On 2 L of oxygen by nasal cannula, will continue on discharge   Recent admission at [facility] 4/2-4/23  for acute cholecystitis/septic shock and NSTEMI.  Currently on cholecystostomy tube. Cholecystostomy tube was placed in at [facility].  I did speak with the patient's niece and informed about her follow-up with surgeon/IR  and she said she was going to follow with them   ESRD and HD nephrology on board for hemodialysis needs.   Type 2 diabetes mellitus   Hemoglobin A1c of 6.5.  Diabetic diet.   Peripheral neuropathy:  Takes Lyrica 75 milligram twice daily at home.  Currently on 75 milligrams daily.  Gradually increase as tolerated but appears to be somewhat sleepy today.  Could be adjusted as outpatient.   Deconditioning debility, ambulatory dysfunction.   Continue PT OT.   limited ambulation and decreased ability to use hemodialysis chair.    Hyponatremia: Mild.  Monitor as outpatient.   Hypomagnesemia.  Received 2 g of IV magnesium sulfate today.  Check level in the next visit.  Class I Obesity: Body mass index is 30.59 kg/m.  Would benefit from weight loss as outpatient.   Disposition.  At this time, patient is stable for disposition skilled nursing facility.  Communicated with the patient's niece 11/24/2022  Medical Consultants:   Nephrology GI Procedures:    PRBC transfusion 1 unit Hemodialysis Video capsule endoscopy Subjective:   Today, patient was seen and examined at bedside.    Denies any nausea vomiting shortness of breath or chest pain.   Discharge Exam:   Vitals:   11/24/22 1207 11/24/22 1231  BP: 92/70    Pulse: (!) 114   Resp: 20 18  Temp: 98.5 F (36.9 C) 98.4 F (36.9 C)  SpO2: 96%    Vitals:   11/24/22 0300 11/24/22 0843 11/24/22 1207 11/24/22 1231  BP: (!) 91/43 (!) 106/59 92/70   Pulse: (!) 115 (!) 113 (!) 114   Resp: 20 (!) 25 20 18   Temp: 98.1 F (36.7 C) 98.4 F (36.9 C) 98.5 F (36.9 C) 98.4 F (36.9 C)  TempSrc: Oral Oral Oral   SpO2: 93% 99% 96%   Weight:      Height:        General: Mildly somnolent but Communicative, elderly male, on nasal cannula oxygen,  HENT:   No scleral pallor or icterus noted. Oral mucosa is moist.  Chest: Diminished breath sounds bilaterally.  No crackles or wheezes.  CVS: S1 &S2 heard. No murmur.  Regular rate and rhythm. Abdomen: Soft, nontender, nondistended.  Right abdominal drain in place.  Bowel sounds are heard.   Extremities: No cyanosis, clubbing or edema.  Peripheral pulses are palpable. Psych: Alert, awake and mildly somnolent CNS:  No cranial nerve deficits.  Power equal in all extremities.  Generalized weakness noted Skin: Warm and dry.  No rashes noted.  The results of significant diagnostics from this hospitalization (including imaging, microbiology, ancillary and laboratory) are listed below for reference.     Diagnostic Studies:  EEG adult  Result Date: 11/09/2022 Charlsie Quest, MD     11/09/2022  2:02 PM Patient Name: GAMBIT GOLIA MRN: [id number] Epilepsy Attending: Charlsie Quest Referring Physician/Provider: John Giovanni, MD Date: 11/09/2022 Duration: 25.03 mins Patient history: 77yo M with ams and syncope. Getting eeg to evaluate for seizure. Level of alertness: Awake, asleep AEDs during EEG study: None Technical aspects: This EEG study was done with scalp electrodes positioned according to the 10-20 International system of electrode placement. Electrical activity was reviewed with band pass filter of 1-70Hz , sensitivity of 7 uV/mm, display speed of 78mm/sec with a 60Hz  notched filter applied as  appropriate. EEG data were recorded continuously and digitally stored.  Video monitoring was available and reviewed as appropriate. Description: The posterior dominant rhythm consists of 7.5 Hz activity of moderate voltage (25-35 uV) seen predominantly in posterior head regions, symmetric and reactive to eye opening and eye closing. Sleep was characterized by vertex waves, sleep spindles (12 to 14 Hz), maximal frontocentral region. EEG showed intermittent generalized 3-6hz  theta-delta slowing. Physiologic photic driving was not seen during photic stimulation. Hyperventilation was not performed.   ABNORMALITY - Intermittent slow, generalized IMPRESSION: This study is suggestive of mild diffuse encephalopathy. No seizures or epileptiform discharges were seen throughout the recording. Priyanka Annabelle Harman   CT HEAD WO CONTRAST ( )  Result Date: 11/09/2022 CLINICAL DATA:  Mental status change, unknown cause. EXAM: CT HEAD WITHOUT CONTRAST TECHNIQUE: Contiguous axial images were obtained from the base of the skull through the vertex without intravenous contrast. RADIATION DOSE REDUCTION: This exam was performed according to the departmental dose-optimization program which includes automated exposure control, adjustment of the mA and/or kV according to patient size and/or use of iterative reconstruction technique. COMPARISON:  06/22/2022. FINDINGS: Brain: No acute intracranial hemorrhage, midline shift or mass effect. No extra-axial fluid collection. Periventricular white matter hypodensities are present bilaterally. No hydrocephalus. Vascular: No hyperdense vessel or unexpected calcification. Skull: Normal. Negative for fracture or focal lesion. Sinuses/Orbits: No acute finding. Other: None. IMPRESSION: 1. No acute intracranial process. 2. Chronic microvascular ischemic changes. Electronically Signed   By: Thornell Sartorius M.D.   On: 11/09/2022 03:16   DG Chest Port 1 View  Result Date: 11/08/2022 CLINICAL DATA:   Syncope EXAM: PORTABLE CHEST 1 VIEW COMPARISON:  09/25/2022 FINDINGS: Mild cardiomegaly. Worsened bilateral lower lung airspace disease. Possible small left effusion. Aortic atherosclerosis. No pneumothorax. Left axillary vascular stent IMPRESSION: Worsened bilateral lower lung airspace disease which may be due to atelectasis or pneumonia. Possible small left effusion. Mild cardiomegaly. Electronically Signed   By: Jasmine Pang M.D.   On: 11/08/2022 19:28     Labs:   Basic Metabolic Panel: Recent Labs  Lab 11/18/22 0107 11/19/22 0146 11/20/22 0808 11/23/22 0137 11/24/22 0137  NA 134* 134* 135 133* 135  K 3.8 3.8 4.1 4.5 4.1  CL 97* 98 99 99 97*  CO2 29 30 28 28 30   GLUCOSE 96 87 80 99 89  BUN 22 14 19 22 13   CREATININE 6.14* 4.48* 6.21* 6.87* 4.39*  CALCIUM 8.4* 8.3* 8.6* 8.7* 8.6*  MG  --   --   --  1.8 1.6*  PHOS  --   --  3.2 2.9  --    GFR Estimated Creatinine Clearance: 1.4 mL/min (A) (by C-G formula based on SCr of 4.39 mg/dL (H)). Liver Function Tests: Recent Labs  Lab 11/18/22 0107 11/20/22 0808 11/23/22 0137  AST 18  --   --   ALT  18  --   --   ALKPHOS 76  --   --   BILITOT 0.8  --   --   PROT 6.8  --   --   ALBUMIN 1.9* 2.0* 2.0*   No results for input(s): "LIPASE", "AMYLASE" in the last 168 hours. No results for input(s): "AMMONIA" in the last 168 hours. Coagulation profile No results for input(s): "INR", "PROTIME" in the last 168 hours.  CBC: Recent Labs  Lab 11/18/22 0107 11/19/22 0146 11/20/22 0808 11/21/22 2203 11/23/22 0137 11/24/22 0137  WBC 7.9 7.5 7.5  --  7.0 6.0  NEUTROABS  --   --   --   --  4.8  --   HGB 8.0* 8.1* 8.0* 8.3* 8.4* 8.5*  HCT 25.6* 26.9* 26.7* 27.5* 28.2* 28.5*  MCV 100.8* 101.5* 102.7*  --  100.0 98.3  PLT 284 290 289  --  292 256   Cardiac Enzymes: No results for input(s): "CKTOTAL", "CKMB", "CKMBINDEX", "TROPONINI" in the last 168 hours. BNP: Invalid input(s): "POCBNP" CBG: Recent Labs  Lab 11/23/22 0543  11/23/22 1208 11/23/22 2129 11/24/22 0608 11/24/22 1126  GLUCAP 82 126* 75 73 106*   D-Dimer No results for input(s): "DDIMER" in the last 72 hours. Hgb A1c No results for input(s): "HGBA1C" in the last 72 hours. Lipid Profile No results for input(s): "CHOL", "HDL", "LDLCALC", "TRIG", "CHOLHDL", "LDLDIRECT" in the last 72 hours. Thyroid function studies No results for input(s): "TSH", "T4TOTAL", "T3FREE", "THYROIDAB" in the last 72 hours.  Invalid input(s): "FREET3" Anemia work up No results for input(s): "VITAMINB12", "FOLATE", "FERRITIN", "TIBC", "IRON", "RETICCTPCT" in the last 72 hours. Microbiology No results found for this or any previous visit (from the past 240 hour(s)).   Discharge Instructions:   Discharge Instructions     Call MD for:  persistant nausea and vomiting   Complete by: As directed    Call MD for:  severe uncontrolled pain   Complete by: As directed    Call MD for:  temperature >100.4   Complete by: As directed    Diet - low sodium heart healthy   Complete by: As directed    Diet Carb Modified   Complete by: As directed    Discharge instructions   Complete by: As directed    Follow-up with your primary care provider at the skilled nursing facility in 3 to 5 days.  Check blood work at that time.  Continue medications as prescribed.  Follow-up with [facility] for management of cholecystostomy tube.  Continue hemodialysis as scheduled.  Seek medical attention for worsening symptoms.   Discharge wound care:   Complete by: As directed    Cleanse buttock wounds with saline, pat dry Cover over areas with single layer of xeroform gauze.  Top with silicone foam dressing Change daily   Increase activity slowly   Complete by: As directed       Allergies as of 11/24/2022   No Known Allergies      Medication List     STOP taking these medications    AMBULATORY NON FORMULARY MEDICATION   aspirin EC 81 MG tablet   metoprolol tartrate  25 MG tablet Commonly known as: LOPRESSOR   omeprazole 20 MG capsule Commonly known as: PRILOSEC       TAKE these medications    acetaminophen 325 MG tablet Commonly known as: TYLENOL Take 2 tablets (650 mg total) by mouth every 6 (six) hours as needed for mild pain (or Fever >/=  101).   atorvastatin 40 MG tablet Commonly known as: LIPITOR Take 40 mg by mouth daily.   b complex-vitamin c-folic acid 0.8 MG Tabs tablet Take 1 tablet by mouth at bedtime.   calcitRIOL 0.25 MCG capsule Commonly known as: ROCALTROL Take 3 capsules (0.75 mcg total) by mouth every Monday, Wednesday, and Friday with hemodialysis. Start taking on: Nov 25, 2022   cinacalcet 30 MG tablet Commonly known as: SENSIPAR Take 30 mg by mouth daily.   Eliquis 5 MG Tabs tablet Generic drug: apixaban Take 1 tablet (5 mg total) by mouth 2 (two) times daily. Resume Your Eliquis after 5 days   midodrine 5 MG tablet Commonly known as: PROAMATINE Take 3 tablets (15 mg total) by mouth in the morning, at noon, and at bedtime. Prior to dialysis M-W-F What changed: how much to take   pantoprazole 40 MG tablet Commonly known as: Protonix Take 1 tablet (40 mg total) by mouth 2 (two) times daily before a meal. What changed: when to take this   Pataday 0.2 % Soln Generic drug: Olopatadine HCl Place 1 drop into both eyes daily.   pregabalin 75 MG capsule Commonly known as: LYRICA Take 1 capsule (75 mg total) by mouth at bedtime.   traZODone 50 MG tablet Commonly known as: DESYREL Take 1 tablet (50 mg total) by mouth at bedtime as needed for sleep.   Velphoro 500 MG chewable tablet Generic drug: sucroferric oxyhydroxide Chew 500 mg by mouth 3 (three) times daily with meals.               Discharge Care Instructions  (From admission, onward)           Start     Ordered   11/24/22 0000  Discharge wound care:       Comments: Cleanse buttock wounds with saline, pat dry Cover over areas with  single layer of xeroform gauze.  Top with silicone foam dressing Change daily   11/24/22 1528            Follow-up Information     Imogene Burn, MD Follow up on 11/26/2022.   Specialty: Gastroenterology Why: appt at 1:50pm, please arrive 10-15 minutes prior to appt Contact information: [address] [place] [address] [phone]                  Time coordinating discharge: 39 minutes  Signed:  Alexys Gassett  [facility] 11/24/2022, 3:28 PM

## 2022-11-24 NOTE — Progress Notes (Addendum)
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  Patient did have CPR done at the skilled nursing facility prior to transfer to the hospital.  Of note, patient was recently admitted to [facility] 4/2-4/23 for acute cholecystitis/septic shock and NSTEMI.  He was initially in the ICU requiring vasopressor support.  General surgery had decided against surgical intervention due to patient's history of aortic stenosis.  He was treated with antibiotics and had underwent cholecystostomy tube placement by IR.  Ultimately discharged to SNF with cholecystectomy tube in place.  Patient was on supplemental oxygen at the time of discharge. Per review of discharge summary, had biopsy of stomach/fundus mass done at [facility] and reportedly results inconclusive per pathologist who recommended deeper biopsy samples in the future.   At the skilled nursing facility, patient did have a syncopal episode and was found to be hypoxic with hemoglobin of 7.4.  Chest x-ray showed worsening bilateral airspace opacity.  Patient was somnolent.  During hospitalization patient was treated for pneumonia, and underwent to EGD for upper GI bleed had 1 unit of packed RBC.  Patient was noted to have duodenal polyps and underwent capsule endoscopy with tiny AVM versus localized gastritis.  No further bleeding was noted and Eliquis was resumed.   2 D Echocardiogram done on  06/13/2021 revealed RVSP of 51.6 mmHg.  Patient was seen by nephrology during hospitalization and underwent hemodialysis Monday Wednesday Friday.  Currently awaiting for outpatient hemodialysis prior to going to skilled nursing facility.   Assessment and Plan:   Acute toxic metabolic encephalopathy Syncope: CT head, TSH, EEG, ABG overall unremarkable besides mild hypercapnia.  Appears to be somnolent today.  Acute GI bleed,  Acute on chronic anemia secondary to blood loss: Patient had melena on rectal exam with FOBT positive.  GI saw the patient and had EGD done last month with  normal esophagus, hiatal hernia, single submucosal papule (nodule) found in the stomach, and 2 duodenal polyps.  Status post capsule STUDY:tiny gatric AVM vs mild localized gastritis vs healing from previous biopsy site without bleeding.  During hospitalization, patient received 1 unit of PRBC, no further GI workup at this time.   Continue PPI.  Latest hemoglobin at 8.5 from 8.4   Hypotension chronic: Continue midodrine.  PAF on Eliquis   Pneumonia: Completed course of antibiotic.  On 2 L of oxygen by nasal cannula  Recent admission at [facility] 4/2-4/23  for acute cholecystitis/septic shock and NSTEMI.  Currently on cholecystostomy tube. Cholecystostomy tube was placed in at [facility].  I did speak with the patient's niece and informed about her follow-up with surgeon/IR  and she said she was going to follow with them   ESRD and HD nephrology on board for hemodialysis needs.  Type 2 diabetes mellitus continue sliding scale insulin, Accu-Cheks, diabetic diet.  Hemoglobin A1c of 6.5.   Peripheral neuropathy:  Takes Lyrica 75 milligram twice daily at home.  Currently on 75 milligrams daily.  Gradually increase as tolerated but appears to be  somewhat sleepy today.    Deconditioning debility, ambulatory dysfunction.   Continue PT OT.   limited ambulation and decreased ability to use hemodialysis chair.     Hyponatremia: Mild.  Monitor  Hypomagnesemia.  Will receive 2 g of IV magnesium sulfate today. Check level in am.   Class I Obesity: Body mass index is 30.59 kg/m.  Would benefit from weight loss as outpatient.    DVT prophylaxis: SCDs Start: 11/09/22 0136 apixaban (ELIQUIS) tablet 5 mg   Code Status:     Code Status: Full Code  Disposition: Skilled nursing facility when arranged, insurance authorization pending.  Status is: Inpatient  Remains inpatient appropriate because: Awaiting for skilled nursing facility placement, medically stable for disposition.   Family Communication: Spoke with the patient's niece on the phone and updated her about the clinical condition of the patient.  Consultants:  Nephrology  Procedures:  PRBC transfusion 1 unit. Hemodialysis  Antimicrobials:  None  Anti-infectives (From admission, onward)    Start     Dose/Rate Route Frequency Ordered Stop   11/11/22 1200  vancomycin (VANCOCIN) IVPB 1000 mg/200 mL premix        1,000 mg 200 mL/hr over 60 Minutes Intravenous Every M-W-F (Hemodialysis) 11/09/22 1523 11/13/22 1429   11/09/22 1615  vancomycin (VANCOREADY) IVPB 2000 mg/400 mL        2,000 mg 200 mL/hr over 120 Minutes Intravenous  Once 11/09/22 1521 11/10/22 0212   11/09/22 1600  ceFEPIme (MAXIPIME) 1 g in sodium chloride 0.9 % 100 mL IVPB        1 g 200 mL/hr over 30 Minutes Intravenous Every 24 hours 11/09/22 1459 11/13/22 2122   11/09/22 1545  linezolid (ZYVOX) IVPB 600 mg  Status:  Discontinued        600 mg 300 mL/hr over 60 Minutes Intravenous Every 12 hours 11/09/22 1500 11/09/22 1519       Subjective: Today, patient was seen and examined at bedside.  Patient  sleepy at the time of my exam..  Denies any nausea vomiting shortness of breath or chest pain.  Objective: Vitals:   11/24/22 0300 11/24/22 0843 11/24/22 1207 11/24/22 1231  BP: (!) 91/43 (!) 106/59 92/70   Pulse: (!) 115 (!) 113 (!) 114   Resp: 20 (!) 25 20 18    Temp: 98.1 F (36.7 C) 98.4 F (36.9 C) 98.5 F (36.9 C) 98.4 F (36.9 C)  TempSrc: Oral Oral Oral   SpO2: 93% 99% 96%   Weight:      Height:        Intake/Output Summary (Last 24 hours) at 11/24/2022 1341 Last data filed at 11/24/2022 0650 Gross per 24 hour  Intake 240 ml  Output 500 ml  Net -260 ml    Filed Weights   11/23/22 1535 11/23/22 1536 11/24/22 0249  Weight: 96.7 kg 96.7 kg 7 kg    Physical Examination: Body mass index is 2.21 kg/m.   General: Mildly somnolent but Communicative, elderly male, on nasal cannula oxygen,  HENT:   No scleral pallor or icterus noted. Oral mucosa is moist.  Chest: Diminished breath sounds bilaterally.  No crackles or wheezes.  CVS: S1 &S2 heard. No murmur.  Regular rate and rhythm. Abdomen: Soft, nontender, nondistended.  Right abdominal drain in place.  Bowel sounds are heard.   Extremities: No cyanosis, clubbing or edema.  Peripheral pulses are palpable. Psych: Alert, awake and mildly somnolent CNS:  No cranial nerve deficits.  Power equal in all extremities.  Generalized  weakness noted Skin: Warm and dry.  No rashes noted.  Data Reviewed:   CBC: Recent Labs  Lab 11/18/22 0107 11/19/22 0146 11/20/22 0808 11/21/22 2203 11/23/22 0137 11/24/22 0137  WBC 7.9 7.5 7.5  --  7.0 6.0  NEUTROABS  --   --   --   --  4.8  --   HGB 8.0* 8.1* 8.0* 8.3* 8.4* 8.5*  HCT 25.6* 26.9* 26.7* 27.5* 28.2* 28.5*  MCV 100.8* 101.5* 102.7*  --  100.0 98.3  PLT 284 290 289  --  292 256     Basic Metabolic Panel: Recent Labs  Lab 11/18/22 0107 11/19/22 0146 11/20/22 0808 11/23/22 0137 11/24/22 0137  NA 134* 134* 135 133* 135  K 3.8 3.8 4.1 4.5 4.1  CL 97* 98 99 99 97*  CO2 29 30 28 28 30   GLUCOSE 96 87 80 99 89  BUN 22 14 19 22 13   CREATININE 6.14* 4.48* 6.21* 6.87* 4.39*  CALCIUM 8.4* 8.3* 8.6* 8.7* 8.6*  MG  --   --   --  1.8 1.6*  PHOS  --   --  3.2 2.9  --      Liver Function Tests: Recent Labs  Lab 11/18/22 0107  11/20/22 0808 11/23/22 0137  AST 18  --   --   ALT 18  --   --   ALKPHOS 76  --   --   BILITOT 0.8  --   --   PROT 6.8  --   --   ALBUMIN 1.9* 2.0* 2.0*      Radiology Studies: ECHOCARDIOGRAM COMPLETE  Result Date: 11/24/2022    ECHOCARDIOGRAM REPORT   Patient Name:   NESBITT GANDIA Date of Exam: 11/24/2022 Medical Rec #:  [id number]      Height:       70.0 in Accession #:    [id number]     Weight:       15.4 lb Date of Birth:  1945-10-14      BSA:          0.703 m Patient Age:    76 years       BP:           106/59 mmHg Patient Gender: M              HR:           112 bpm. Exam Location:  Inpatient Procedure: 2D Echo, Color Doppler and Cardiac Doppler Indications:    Pulmonary Hypertension  History:        Patient has prior history of Echocardiogram examinations, most                 recent 06/13/2022. Aortic Valve Disease, Arrythmias:Atrial                 Fibrillation; Risk Factors:Hypertension and Dyslipidemia.  Sonographer:    Raeford Razor onographer#2:   Sheralyn Boatman RDCS Referring Phys: 68 SYLVESTER I OGBATA IMPRESSIONS  1. Aortic valve is thickened and does appear to have restrictive motion during systole. On the aortic side of the valve there appears to be an echodensity which is poorly visualize ( can not rule out reverberation artifact) - may benefit from TEE for clarification. The aortic valve is calcified. There is moderate calcification of the aortic valve. There is moderate thickening of the aortic valve. Aortic valve regurgitation is trivial. Moderate aortic valve stenosis. Aortic valve mean gradient measures 20.2 mmHg. Aortic valve Vmax measures 2.99 m/s,  AVA by planimetry 0.791cm2.  2. Left ventricular ejection fraction, by estimation, is 50 to 55%. The left ventricle has low normal function. The left ventricle has no regional wall motion abnormalities. There is mild concentric left ventricular hypertrophy. Left ventricular diastolic parameters are indeterminate.  3. Right ventricular  systolic function is mildly reduced. The right ventricular size is mildly enlarged.  4. Left atrial size was severely dilated.  5. Right atrial size was severely dilated.  6. A small pericardial effusion is present. The pericardial effusion is posterior to the left ventricle.  7. The mitral valve is normal in structure. Mild mitral valve regurgitation. No evidence of mitral stenosis.  8. Tricuspid valve regurgitation is moderate to severe.  9. The inferior vena cava is normal in size with greater than 50% respiratory variability, suggesting right atrial pressure of 3 mmHg. FINDINGS  Left Ventricle: Left ventricular ejection fraction, by estimation, is 50 to 55%. The left ventricle has low normal function. The left ventricle has no regional wall motion abnormalities. The left ventricular internal cavity size was normal in size. There is mild concentric left ventricular hypertrophy. Left ventricular diastolic parameters are indeterminate. Right Ventricle: The right ventricular size is mildly enlarged. No increase in right ventricular wall thickness. Right ventricular systolic function is mildly reduced. Left Atrium: Left atrial size was severely dilated. Right Atrium: Right atrial size was severely dilated. Pericardium: A small pericardial effusion is present. The pericardial effusion is posterior to the left ventricle. Mitral Valve: The mitral valve is normal in structure. Mild mitral annular calcification. Mild mitral valve regurgitation. No evidence of mitral valve stenosis. Tricuspid Valve: The tricuspid valve is normal in structure. Tricuspid valve regurgitation is moderate to severe. No evidence of tricuspid stenosis. Aortic Valve: Aortic valve is thickened and does appear to have restrictive motion with during systole. On the aortic side of the valve there appears to be an echodensity which is poorly visualize ( can not rule out reverberation artifact) - may benefit from TEE for clarification. The aortic valve  is calcified. There is moderate calcification of the aortic valve. There is moderate thickening of the aortic valve. There is mild to moderate aortic valve annular calcification. Aortic valve regurgitation is trivial. Moderate aortic stenosis is present. Aortic valve mean gradient measures 20.2 mmHg. Aortic valve peak gradient measures 35.8 mmHg. Aortic valve area, by VTI measures 2.54 cm. Pulmonic Valve: The pulmonic valve was not well visualized. Pulmonic valve regurgitation is trivial. No evidence of pulmonic stenosis. Aorta: The aortic root is normal in size and structure. Venous: The inferior vena cava is normal in size with greater than 50% respiratory variability, suggesting right atrial pressure of 3 mmHg. IAS/Shunts: No atrial level shunt detected by color flow Doppler.  LEFT VENTRICLE PLAX 2D LVIDd:         3.90 cm     Diastology LVIDs:         3.10 cm     LV e' medial:    7.78 cm/s LV PW:         1.20 cm     LV E/e' medial:  14.5 LV IVS:        1.00 cm     LV e' lateral:   12.90 cm/s LVOT diam:     2.00 cm     LV E/e' lateral: 8.8 LV SV:         135 LV SV Index:   192 LVOT Area:     3.14 cm  LV Volumes (MOD)  LV vol d, MOD A2C: 92.2 ml LV vol d, MOD A4C: 62.0 ml LV vol s, MOD A2C: 32.4 ml LV vol s, MOD A4C: 33.1 ml LV SV MOD A2C:     59.9 ml LV SV MOD A4C:     62.0 ml LV SV MOD BP:      42.8 ml RIGHT VENTRICLE            IVC RV Basal diam:  3.60 cm    IVC diam: 2.40 cm RV S prime:     9.11 cm/s TAPSE (M-mode): 1.3 cm LEFT ATRIUM             Index        RIGHT ATRIUM           Index LA diam:        4.00 cm 5.69 cm/m   RA Area:     25.10 cm LA Vol (A2C):   51.5 ml 73.29 ml/m  RA Volume:   86.50 ml  123.11 ml/m LA Vol (A4C):   52.7 ml 75.00 ml/m LA Biplane Vol: 51.9 ml 73.86 ml/m  AORTIC VALVE AV Area (Vmax):    2.41 cm AV Area (Vmean):   2.64 cm AV Area (VTI):     2.54 cm AV Vmax:           299.25 cm/s AV Vmean:          212.750 cm/s AV VTI:            0.529 m AV Peak Grad:      35.8 mmHg AV Mean  Grad:      20.2 mmHg LVOT Vmax:         229.50 cm/s LVOT Vmean:        178.500 cm/s LVOT VTI:          0.428 m LVOT/AV VTI ratio: 0.81  AORTA Ao Root diam: 3.60 cm Ao Asc diam:  3.70 cm MITRAL VALVE                TRICUSPID VALVE MV Area (PHT): 4.17 cm     TR Peak grad:   53.0 mmHg MV Decel Time: 182 msec     TR Vmax:        364.00 cm/s MV E velocity: 113.00 cm/s                             SHUNTS                             Systemic VTI:  0.43 m                             Systemic Diam: 2.00 cm Kardie Tobb DO Electronically signed by Thomasene Ripple DO Signature Date/Time: 11/24/2022/12:13:46 PM    Final       LOS: 15 days    Joycelyn Das, MD [facility] Available via Epic secure chat 7am-7pm After these hours, please refer to coverage provider listed on [email or web address] 11/24/2022, 1:41 PM

## 2022-11-24 NOTE — Progress Notes (Signed)
Occupational Therapy Treatment Patient Details Name: Justin [name] MRN: [id number] DOB: 04-11-1946 Today's Date: 11/24/2022   History of present illness Pt is a 77 y.o. male admitted 4/28 from SNF with syncope. Pt with recent hospitalization at [facility] in April 2024 for NSTEMI. 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   OT comments  Pt continuing to progress towards pt focused goals. Pt continuing to present with impaired balance and decreased strength to maintain a standing position. Despite multiple Max A standing attempts with and without Huntley Dec stedy and cueing, pt unable to achieve fully erect position. OT to continue to progress pt as able, DC plans remain appropriate for SNF at this time.   Recommendations for follow up therapy are one component of a multi-disciplinary discharge planning process, led by the attending physician.  Recommendations may be updated based on patient status, additional functional criteria and insurance authorization.    Assistance Recommended at Discharge Frequent or constant Supervision/Assistance  Patient can return home with the following  Two people to help with walking and/or transfers;A lot of help with bathing/dressing/bathroom;Assistance with cooking/housework;Direct supervision/assist for medications management;Direct supervision/assist for financial management;Assist for transportation;Help with stairs or ramp for entrance   Equipment Recommendations  Other (comment) (TBD at SNF)    Recommendations for Other Services      Precautions / Restrictions Precautions Precautions: Fall;Other (comment) Precaution Comments: Pressure sores B heels,  hypersensitivity/pain B feet Required Braces or Orthoses:  (pt has bil heel offloading darco shoes in room) Restrictions Weight Bearing Restrictions: No Other Position/Activity Restrictions: pt reports bil foot discomfort but with darco shoes able to tolerate some pressure of forefoot on floor       Mobility Bed Mobility Overal bed mobility: Needs Assistance Bed Mobility: Rolling, Sit to Supine Rolling: Min assist     Sit to supine: Min assist   General bed mobility comments: Min A to position LLE on bed, pt uses bed rail and RUE to assist with swinging RLE onto bed.    Transfers Overall transfer level: Needs assistance Equipment used: None Transfers: Sit to/from Stand, Bed to chair/wheelchair/BSC Sit to Stand: Max assist   Squat pivot transfers: Max assist       General transfer comment: Attempted STS with Huntley Dec stedy Max A but pt unable to acheive erect position to place down seats. Max A for squat pivot with cues for pt to lean anteriorly onto therapists shoulders. Max STS from EOB for ~2 mins, unable to fully stand upright but with increased standing and RN performing perciare pt getting closer to fully erect.     Balance Overall balance assessment: Needs assistance Sitting-balance support: Feet supported, Bilateral upper extremity supported Sitting balance-Leahy Scale: Fair     Standing balance support: Bilateral upper extremity supported Standing balance-Leahy Scale: Zero                             ADL either performed or assessed with clinical judgement   ADL Overall ADL's : Needs assistance/impaired                             Toileting- Clothing Manipulation and Hygiene: Sit to/from stand;Maximal assistance  Toileting - Clothing Manipulation Details (indicate cue type and reason): Max A to stand while RN performs rear pericare, then min A for rolling in bed to position bed pan     Functional mobility during ADLs: Maximal assistance  (transfer to bed from chair) General ADL Comments: Pt with draco shoes donned throughout session    Extremity/Trunk Assessment              Vision       Perception     Praxis      Cognition Arousal/Alertness: Awake/alert Behavior During Therapy: WFL for tasks assessed/performed Overall Cognitive Status: Within Functional Limits for tasks assessed                                          Exercises      Shoulder Instructions       General Comments HR in the 90s with functional mobility to bed, Sp02 stable on 2L.    Pertinent Vitals/ Pain       Pain Assessment Pain Assessment: No/denies pain Pain Location: pt not reporting any pain during intervention  Home Living                                          Prior Functioning/Environment              Frequency  Min 1X/week        Progress Toward Goals  OT Goals(current goals can now be found in the care plan section)  Progress towards OT goals: Progressing toward goals  Acute Rehab OT Goals Patient Stated Goal: to be more independent OT Goal Formulation: With patient Time For Goal Achievement: 11/27/22 Potential to Achieve Goals: Good  Plan Discharge plan remains appropriate;Frequency remains appropriate    Co-evaluation                 AM-PAC OT "6 Clicks" Daily Activity     Outcome Measure   Help from another person eating meals?: None Help from another person taking care of personal grooming?: A Little Help from another person toileting, which includes using toliet, bedpan, or urinal?: A Lot Help from another person bathing (including washing, rinsing, drying)?: A Lot Help from another person to put on and taking off regular upper body clothing?: A Little Help from another person to put on and taking off regular lower body clothing?: A Lot 6 Click Score: 16    End of Session Equipment Utilized During Treatment: Gait belt  OT Visit Diagnosis:  Unsteadiness on feet (R26.81);Muscle weakness (generalized) (M62.81);Pain Pain - Right/Left:  (bilaterally) Pain - part of body: Ankle and joints of foot   Activity Tolerance Patient tolerated treatment well   Patient Left in bed;with call bell/phone within reach   Nurse Communication Mobility status;Other (comment) (pt on bed pan)        Time: 1610-9604 OT Time Calculation (min): 21 min  Charges: OT General Charges $OT Visit: 1 Visit OT Treatments $Therapeutic Activity: 8-22 mins  11/24/2022  AB, OTR/L  Acute Rehabilitation Services  Office: [phone]   Tristan Schroeder 11/24/2022, 3:07 PM

## 2022-11-24 NOTE — Progress Notes (Signed)
Physical Therapy Treatment Patient Details Name: Justin [name] MRN: [id number] DOB: 1946/06/30 Today's Date: 11/24/2022   History of Present Illness Pt is a 77 y.o. male admitted 4/28 from SNF with syncope. Pt with recent hospitalization at [facility] in April 2024 for NSTEMI. 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    PT Comments    Pt received in supine, agreeable to therapy session with encouragement, with good effort for lateral scoot transfer training and for seated activity. Pt needing up to +2 maxA for squat pivot transfer from bed>drop arm chair and was agreeable to sit up in chair >1 hour at end of session. Pt needing min/modA for transfer to EOB from sidelying and min guard to minA for seated balance due to L lean with fatigue. Reviewed seated/supine pressure relief strategies and frequency, pt would benefit from air mattress to assist in pressure relief and geomat cushion as previous cushion is no longer in his room. Per chart review, pt has tolerated OOB to chair on 2 occasions this week and sat up 4+ hours each time. He remains a good candidate for low to moderate intensity post-acute rehab <3 hours/day. Pt continues to benefit from PT services to progress toward functional mobility goals.    Recommendations for follow up therapy are one component of a multi-disciplinary discharge planning process, led by the attending physician.  Recommendations may be updated based on patient status, additional functional criteria and insurance authorization.  Follow Up Recommendations  Can patient physically be transported by private vehicle: No    Assistance Recommended at Discharge Frequent or  constant Supervision/Assistance  Patient can return home with the following Two people to help with walking and/or transfers;A lot of help with bathing/dressing/bathroom   Equipment Recommendations  Other (comment);Wheelchair (measurements PT);Wheelchair cushion (measurements PT) (currently: roho WC cushion due to skin breakdown, possibly slide board once wounds healed, mechanical lift for OOB transfers, air bed)    Recommendations for Other Services       Precautions / Restrictions Precautions Precautions: Fall;Other (comment) Precaution Comments: Pressure sores B heels, hypersensitivity/pain B feet Required Braces or Orthoses:  (pt has bil heel offloading darco shoes in room) Restrictions Weight Bearing Restrictions: No Other Position/Activity Restrictions: pt reports bil foot discomfort but with darco shoes able to tolerate some pressure of forefoot on floor     Mobility  Bed Mobility Overal bed mobility: Needs Assistance Bed Mobility: Rolling Rolling: Min assist Sidelying to sit: Min assist, HOB elevated, Mod assist       General bed mobility comments: good initiation to roll to his L side today, min vcs/tcs for improved body mechanics and pt able to effectively use rail to assist. As pt fatigued with sitting EOB, he is impulsive to return to sidelying (with AA/guarding for safety) and needs up to modA to return to full upright posture and scoot to foot flat.    Transfers Overall transfer level: Needs assistance Equipment used: None (bed pads) Transfers: Bed to chair/wheelchair/BSC       Squat pivot transfers: Max assist, +2 physical assistance, From elevated surface     General transfer comment: pt with tendency to L lean/LOB but relucant initially for cues/assist for better body mechanics, however when pt not able to scoot on his  own, he was agreeable to therapist assisting him to reposition for better safety and technique. Pt performs x3 squat pivots to get from flat  bed to drop arm recliner and additional squat in chair to reposition linens/chair pad.    Ambulation/Gait                   Stairs             Wheelchair Mobility    Modified Rankin (Stroke Patients Only)       Balance Overall balance assessment: Needs assistance Sitting-balance support: Feet supported, Bilateral upper extremity supported Sitting balance-Leahy Scale: Fair     Standing balance support: Bilateral upper extremity supported Standing balance-Leahy Scale: Zero Standing balance comment: +2 maxA to squat from EOB/chair, pt unable to pull to full upright posture with +2 HHA, will likely need Stedy or Sara Plus and +2 assist to achieve upright                            Cognition Arousal/Alertness: Awake/alert Behavior During Therapy: WFL for tasks assessed/performed Overall Cognitive Status: No family/caregiver present to determine baseline cognitive functioning                                 General Comments: Pt initially reluctant to participate due to report of poor sleep previous evening, but was awake/alert throughout and responded to encouragement to progress mobility to get stronger so he can leave hospital. Pt agreeable to instruction on supine/seated pressure relief strategies in bed/chair and frequency, will need reminders. Pt self-directed and needs to try things himself first at times and see that some techniques are not feasible, then is able to be redirected to better strategy for improved body mechanics/safety.        Exercises Other Exercises Other Exercises: chair push-ups x3 reps, only able to significantly lift hips >1" on one rep due to fatigue after squat pivots    General Comments General comments (skin integrity, edema, etc.): HR 114-116 bpm, SpO2 96% on 2L O2 Walker, BP 92/70 (78) sitting in chair with feet on floor post-exertion      Pertinent Vitals/Pain Pain Assessment Pain Assessment: Faces Faces  Pain Scale: Hurts even more Pain Location: bil feet Pain Descriptors / Indicators: Tender, Sore, Grimacing Pain Intervention(s): Monitored during session, Limited activity within patient's tolerance, Repositioned, Patient requesting pain meds-RN notified (increased agitation when in uncomfortable postures/when therapist attempting to assist him with safe foot placement.)           PT Goals (current goals can now be found in the care plan section) Acute Rehab PT Goals Patient Stated Goal: To get stronger at rehab and eventually back to walking, less pain PT Goal Formulation: With patient Time For Goal Achievement: 11/27/22 Progress towards PT goals: Progressing toward goals    Frequency    Min 1X/week      PT Plan Current plan remains appropriate       AM-PAC PT "6 Clicks" Mobility   Outcome Measure  Help needed turning from your back to your side while in a flat bed without using bedrails?: A Lot (without rails) Help needed moving from lying on your back to sitting on the side of a flat bed without using bedrails?: A Lot Help needed moving to and from a bed to a chair (including a wheelchair)?: Total (+2 maxA to scoot) Help  needed standing up from a chair using your arms (e.g., wheelchair or bedside chair)?: Total Help needed to walk in hospital room?: Total Help needed climbing 3-5 steps with a railing? : Total 6 Click Score: 8    End of Session Equipment Utilized During Treatment: Oxygen;Other (comment) (darco shoes bil to offload his heels) Activity Tolerance: Patient tolerated treatment well;Other (comment);Patient limited by pain (c/o moderate pain in bil LE) Patient left: with call bell/phone within reach;in chair;with chair alarm set (pt requesting not to be reclined and to keep his feet on the floor (pillow placed under his feet with darco shoes still donned)) Nurse Communication: Mobility status;Need for lift equipment;Other (comment);Precautions;Patient requests  pain meds (use hoyer vs +2 and Stedy for return transfer; could benefit from being moved to maxi sky room if able and would benefit from air mattress) PT Visit Diagnosis: Difficulty in walking, not elsewhere classified (R26.2);Pain;Other abnormalities of gait and mobility (R26.89) Pain - part of body: Ankle and joints of foot     Time: 1610-9604 PT Time Calculation (min) (ACUTE ONLY): 26 min  Charges:  $Therapeutic Activity: 23-37 mins                     Allyce Bochicchio P., PTA Acute Rehabilitation Services Secure Chat Preferred 9a-5:30pm Office: [phone]    Dorathy Kinsman [facility] 11/24/2022, 12:32 PM

## 2022-11-24 NOTE — Discharge Instructions (Signed)

## 2022-11-24 NOTE — Progress Notes (Signed)
Contacted by CSW that pt will d/c to snf today. Contacted [facility] to advise clinic of pt's D/C today and that pt should resume care tomorrow. Advised by renal NP that pt was able tolerate HD in the chair yesterday per HD RN.   Olivia Canter Renal Navigator [phone]

## 2022-11-24 NOTE — Progress Notes (Signed)
   11/23/22 1930  Vitals  BP 101/73  Pulse Rate (!) 114  Resp 18  Oxygen Therapy  SpO2 98 %  O2 Device Nasal Cannula  Patient Activity (if Appropriate) In chair  Pulse Oximetry Type Continuous  During Treatment Monitoring  Blood Flow Rate (mL/min) 400 mL/min  Arterial Pressure (mmHg) -140 mmHg  Venous Pressure (mmHg) 240 mmHg  TMP (mmHg) -1 mmHg  Ultrafiltration Rate (mL/min) 373 mL/min  Dialysate Flow Rate (mL/min) 300 ml/min  HD Safety Checks Performed Yes  Intra-Hemodialysis Comments Tx completed

## 2022-11-24 NOTE — Progress Notes (Addendum)
[facility] Progress Note   Subjective:    Seen and examined patient at bedside. Seen sitting in chair in room looking much better, currently awake and alert, actually talking to a family member on the phone. Received HD in chair yesterday-removed . No acute complaints today. SBP remains between 90-low 100s.   Objective Vitals:   11/24/22 0300 11/24/22 0843 11/24/22 1207 11/24/22 1231  BP: (!) 91/43 (!) 106/59 92/70   Pulse: (!) 115 (!) 113 (!) 114   Resp: 20 (!) 25 20 18   Temp: 98.1 F (36.7 C) 98.4 F (36.9 C) 98.5 F (36.9 C) 98.4 F (36.9 C)  TempSrc: Oral Oral Oral   SpO2: 93% 99% 96%   Weight:      Height:       Physical Exam General: Chronically ill appearing man, drowsy. Nasal O2 in place. Heart: RRR; 2/6 murmur Lungs: CTA anteriorly Abdomen: soft Extremities: no LE edema; R foot in brace Dialysis Access:  LUE AVF + bruit  Filed Weights   11/23/22 1535 11/23/22 1536 11/24/22 0249  Weight: 96.7 kg 96.7 kg 7 kg    Intake/Output Summary (Last 24 hours) at 11/24/2022 1256 Last data filed at 11/24/2022 0650 Gross per 24 hour  Intake 240 ml  Output 500 ml  Net -260 ml    Additional Objective Labs: Basic Metabolic Panel: Recent Labs  Lab 11/20/22 0808 11/23/22 0137 11/24/22 0137  NA 135 133* 135  K 4.1 4.5 4.1  CL 99 99 97*  CO2 28 28 30   GLUCOSE 80 99 89  BUN 19 22 13   CREATININE 6.21* 6.87* 4.39*  CALCIUM 8.6* 8.7* 8.6*  PHOS 3.2 2.9  --    Liver Function Tests: Recent Labs  Lab 11/18/22 0107 11/20/22 0808 11/23/22 0137  AST 18  --   --   ALT 18  --   --   ALKPHOS 76  --   --   BILITOT 0.8  --   --   PROT 6.8  --   --   ALBUMIN 1.9* 2.0* 2.0*   No results for input(s): "LIPASE", "AMYLASE" in the last 168 hours. CBC: Recent Labs  Lab 11/18/22 0107 11/19/22 0146 11/20/22 0808 11/21/22 2203 11/23/22 0137 11/24/22 0137  WBC 7.9 7.5 7.5  --  7.0 6.0  NEUTROABS  --   --   --   --  4.8  --   HGB 8.0* 8.1* 8.0* 8.3*  8.4* 8.5*  HCT 25.6* 26.9* 26.7* 27.5* 28.2* 28.5*  MCV 100.8* 101.5* 102.7*  --  100.0 98.3  PLT 284 290 289  --  292 256   Blood Culture    Component Value Date/Time   SDES ABSCESS 06/15/2022 1344   SPECREQUEST PERIRECTAL 06/15/2022 1344   CULT  06/15/2022 1344    FEW ESCHERICHIA COLI NO ANAEROBES ISOLATED Performed at [facility], [address], [place], [place] [address]    REPTSTATUS 06/20/2022 FINAL 06/15/2022 1344    Cardiac Enzymes: No results for input(s): "CKTOTAL", "CKMB", "CKMBINDEX", "TROPONINI" in the last 168 hours. CBG: Recent Labs  Lab 11/23/22 0543 11/23/22 1208 11/23/22 2129 11/24/22 0608 11/24/22 1126  GLUCAP 82 126* 75 73 106*   Iron Studies: No results for input(s): "IRON", "TIBC", "TRANSFERRIN", "FERRITIN" in the last 72 hours. Lab Results  Component Value Date   INR 1.3 (H) 09/10/2022   INR 1.1 05/27/2020   INR 1.3 (H) 01/28/2020   Studies/Results: ECHOCARDIOGRAM COMPLETE  Result Date: 11/24/2022  ECHOCARDIOGRAM REPORT   Patient Name:   DEPAUL JACKOVICH Date of Exam: 11/24/2022 Medical Rec #:  [id number]      Height:       70.0 in Accession #:    [id number]     Weight:       15.4 lb Date of Birth:  [birth year]      BSA:          0.703 m Patient Age:    77 years       BP:           106/59 mmHg Patient Gender: M              HR:           112 bpm. Exam Location:  Inpatient Procedure: 2D Echo, Color Doppler and Cardiac Doppler Indications:    Pulmonary Hypertension  History:        Patient has prior history of Echocardiogram examinations, most                 recent 06/13/2022. Aortic Valve Disease, Arrythmias:Atrial                 Fibrillation; Risk Factors:Hypertension and Dyslipidemia.  Sonographer:    Raeford Razor onographer#2:   Sheralyn Boatman RDCS Referring Phys: [id number] SYLVESTER I OGBATA IMPRESSIONS  1. Aortic valve is thickened and does appear to have restrictive motion during systole. On the aortic side of the valve there appears to be an echodensity  which is poorly visualize ( can not rule out reverberation artifact) - may benefit from TEE for clarification. The aortic valve is calcified. There is moderate calcification of the aortic valve. There is moderate thickening of the aortic valve. Aortic valve regurgitation is trivial. Moderate aortic valve stenosis. Aortic valve mean gradient measures 20.2 mmHg. Aortic valve Vmax measures 2.99 m/s, AVA by planimetry 0.791cm2.  2. Left ventricular ejection fraction, by estimation, is 50 to 55%. The left ventricle has low normal function. The left ventricle has no regional wall motion abnormalities. There is mild concentric left ventricular hypertrophy. Left ventricular diastolic parameters are indeterminate.  3. Right ventricular systolic function is mildly reduced. The right ventricular size is mildly enlarged.  4. Left atrial size was severely dilated.  5. Right atrial size was severely dilated.  6. A small pericardial effusion is present. The pericardial effusion is posterior to the left ventricle.  7. The mitral valve is normal in structure. Mild mitral valve regurgitation. No evidence of mitral stenosis.  8. Tricuspid valve regurgitation is moderate to severe.  9. The inferior vena cava is normal in size with greater than 50% respiratory variability, suggesting right atrial pressure of 3 mmHg. FINDINGS  Left Ventricle: Left ventricular ejection fraction, by estimation, is 50 to 55%. The left ventricle has low normal function. The left ventricle has no regional wall motion abnormalities. The left ventricular internal cavity size was normal in size. There is mild concentric left ventricular hypertrophy. Left ventricular diastolic parameters are indeterminate. Right Ventricle: The right ventricular size is mildly enlarged. No increase in right ventricular wall thickness. Right ventricular systolic function is mildly reduced. Left Atrium: Left atrial size was severely dilated. Right Atrium: Right atrial size was  severely dilated. Pericardium: A small pericardial effusion is present. The pericardial effusion is posterior to the left ventricle. Mitral Valve: The mitral valve is normal in structure. Mild mitral annular calcification. Mild mitral valve regurgitation. No evidence of mitral valve stenosis. Tricuspid Valve: The tricuspid valve is  normal in structure. Tricuspid valve regurgitation is moderate to severe. No evidence of tricuspid stenosis. Aortic Valve: Aortic valve is thickened and does appear to have restrictive motion with during systole. On the aortic side of the valve there appears to be an echodensity which is poorly visualize ( can not rule out reverberation artifact) - may benefit from TEE for clarification. The aortic valve is calcified. There is moderate calcification of the aortic valve. There is moderate thickening of the aortic valve. There is mild to moderate aortic valve annular calcification. Aortic valve regurgitation is trivial. Moderate aortic stenosis is present. Aortic valve mean gradient measures 20.2 mmHg. Aortic valve peak gradient measures 35.8 mmHg. Aortic valve area, by VTI measures 2.54 cm. Pulmonic Valve: The pulmonic valve was not well visualized. Pulmonic valve regurgitation is trivial. No evidence of pulmonic stenosis. Aorta: The aortic root is normal in size and structure. Venous: The inferior vena cava is normal in size with greater than 50% respiratory variability, suggesting right atrial pressure of 3 mmHg. IAS/Shunts: No atrial level shunt detected by color flow Doppler.  LEFT VENTRICLE PLAX 2D LVIDd:         3.90 cm     Diastology LVIDs:         3.10 cm     LV e' medial:    7.78 cm/s LV PW:         1.20 cm     LV E/e' medial:  14.5 LV IVS:        1.00 cm     LV e' lateral:   12.90 cm/s LVOT diam:     2.00 cm     LV E/e' lateral: 8.8 LV SV:         135 LV SV Index:   192 LVOT Area:     3.14 cm  LV Volumes (MOD) LV vol d, MOD A2C: 92.2 ml LV vol d, MOD A4C: 62.0 ml LV vol s, MOD  A2C: 32.4 ml LV vol s, MOD A4C: 33.1 ml LV SV MOD A2C:     59.9 ml LV SV MOD A4C:     62.0 ml LV SV MOD BP:      42.8 ml RIGHT VENTRICLE            IVC RV Basal diam:  3.60 cm    IVC diam: 2.40 cm RV S prime:     9.11 cm/s TAPSE (M-mode): 1.3 cm LEFT ATRIUM             Index        RIGHT ATRIUM           Index LA diam:        4.00 cm 5.69 cm/m   RA Area:     25.10 cm LA Vol (A2C):   51.5 ml 73.29 ml/m  RA Volume:   86.50 ml  123.11 ml/m LA Vol (A4C):   52.7 ml 75.00 ml/m LA Biplane Vol: 51.9 ml 73.86 ml/m  AORTIC VALVE AV Area (Vmax):    2.41 cm AV Area (Vmean):   2.64 cm AV Area (VTI):     2.54 cm AV Vmax:           299.25 cm/s AV Vmean:          212.750 cm/s AV VTI:            0.529 m AV Peak Grad:      35.8 mmHg AV Mean Grad:      20.2 mmHg LVOT  Vmax:         229.50 cm/s LVOT Vmean:        178.500 cm/s LVOT VTI:          0.428 m LVOT/AV VTI ratio: 0.81  AORTA Ao Root diam: 3.60 cm Ao Asc diam:  3.70 cm MITRAL VALVE                TRICUSPID VALVE MV Area (PHT): 4.17 cm     TR Peak grad:   53.0 mmHg MV Decel Time: 182 msec     TR Vmax:        364.00 cm/s MV E velocity: 113.00 cm/s                             SHUNTS                             Systemic VTI:  0.43 m                             Systemic Diam: 2.00 cm Kardie Tobb DO Electronically signed by Thomasene Ripple DO Signature Date/Time: 11/24/2022/12:13:46 PM    Final     Medications:   acetaminophen  650 mg Oral Once   apixaban  5 mg Oral BID   calcitRIOL  0.75 mcg Oral Q M,W,F-HD   Chlorhexidine Gluconate Cloth  6 each Topical Q0600   cinacalcet  30 mg Oral Q supper   darbepoetin (ARANESP) injection - DIALYSIS  100 mcg Subcutaneous Q Sat-1800   insulin aspart  0-9 Units Subcutaneous TID WC   midodrine  15 mg Oral TID WC   pantoprazole  40 mg Oral BID   polyethylene glycol  17 g Oral Once   pregabalin  75 mg Oral QHS   sucroferric oxyhydroxide  500 mg Oral TID WC    Dialysis Orders: [facility] M[facility] 3h  450/600  99kg  2/2.5 bath  LUA  AVF  Hep none - last HD 4/26, post wt 111kg, coming off 10+kg over last 2 sessions - rocaltrol 0.75 mcg po tiw - venofer 100mg  tiw thru 5/08 - mircera 200 mcg IV q 2 wks, last 4/26 - last Hb 7.7 on 4/24  Assessment/Plan: LOC episode/ AMS - had brief CPR x 4 min at SNF before EMS arrived. Was somnolent here x 24 hrs then improved the next day. Looks much better today-seen sitting in chair in his room awake and alert, he is actually talking to a family member on the phone. ABG showed pCO2 of 57.  Pneumonia: S/p course of antibiotics here. On O2 via [place] for desaturations at night. Acute upper GIB - melena on initial ED exam, recent EGD showed duodenal polyps. Pt rec'd 1u prbcs and GI did capsule endo showing tiny gastric AVMs vs localized gastritis. No further bleeding. Eliquis resumed. Acute hypoxic resp failure/ vol overload - pt admitted 10kg over dry wt w/ diffuse edema on exam. Now below prior EDW - continue to challenge as tolerated. Will lower EDW at discharge. 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 He was able to sit in recliner in room for 4.5hr on 5/11. Noted he was in the recliner again for HD 5/13: d/w HD RN who informed me he tolerated well. Noted Mg 1.6 today-will order 2GM IV Magnesium Sulfate over 2 hours. BP: Hypotensive again today - continue midodrine ->  continue dose at 15mg  TID. Parox afib - rate currently controlled. Beta blocker on hold due to hypotension.  Anemia of ESRD: Hgb 8.5 - Aranesp given on 5/11, follow.  Secondary HPTH: CorrCa/Phos to goal. Cont po vdra, sensipar qd and velphoro as binder.  Dispo - pending dc to SNF-authorization is pending.   Salome Holmes, NP [facility] 11/24/2022,12:56 PM  LOS: 15 days

## 2022-11-24 NOTE — Progress Notes (Signed)
Echocardiogram 2D Echocardiogram has been performed.  Justin [name] 11/24/2022, 10:20 AM

## 2022-11-26 ENCOUNTER — Ambulatory Visit: Payer: Medicare Other | Admitting: Internal Medicine

## 2022-12-09 ENCOUNTER — Ambulatory Visit: Payer: Medicare Other | Admitting: Internal Medicine

## 2022-12-13 ENCOUNTER — Inpatient Hospital Stay (HOSPITAL_COMMUNITY)
Admission: EM | Admit: 2022-12-13 | Discharge: 2023-01-02 | DRG: 871 | Disposition: A | Discharge status: Skilled Nursing Facility | Payer: Medicare Other | Source: Skilled Nursing Facility | Attending: Family Medicine | Admitting: Family Medicine

## 2022-12-13 ENCOUNTER — Other Ambulatory Visit: Payer: Self-pay

## 2022-12-13 ENCOUNTER — Encounter (HOSPITAL_COMMUNITY): Payer: Self-pay | Admitting: *Deleted

## 2022-12-13 DIAGNOSIS — I864 Gastric varices: Secondary | ICD-10-CM | POA: Diagnosis present

## 2022-12-13 DIAGNOSIS — I8500 Esophageal varices without bleeding: Secondary | ICD-10-CM | POA: Diagnosis present

## 2022-12-13 DIAGNOSIS — R627 Adult failure to thrive: Secondary | ICD-10-CM | POA: Diagnosis present

## 2022-12-13 DIAGNOSIS — R578 Other shock: Secondary | ICD-10-CM | POA: Diagnosis not present

## 2022-12-13 DIAGNOSIS — E876 Hypokalemia: Secondary | ICD-10-CM | POA: Diagnosis present

## 2022-12-13 DIAGNOSIS — Z87891 Personal history of nicotine dependence: Secondary | ICD-10-CM

## 2022-12-13 DIAGNOSIS — D62 Acute posthemorrhagic anemia: Secondary | ICD-10-CM | POA: Diagnosis present

## 2022-12-13 DIAGNOSIS — I953 Hypotension of hemodialysis: Secondary | ICD-10-CM | POA: Diagnosis not present

## 2022-12-13 DIAGNOSIS — Z8546 Personal history of malignant neoplasm of prostate: Secondary | ICD-10-CM

## 2022-12-13 DIAGNOSIS — M109 Gout, unspecified: Secondary | ICD-10-CM | POA: Diagnosis present

## 2022-12-13 DIAGNOSIS — I071 Rheumatic tricuspid insufficiency: Secondary | ICD-10-CM | POA: Diagnosis present

## 2022-12-13 DIAGNOSIS — K922 Gastrointestinal hemorrhage, unspecified: Principal | ICD-10-CM

## 2022-12-13 DIAGNOSIS — D120 Benign neoplasm of cecum: Secondary | ICD-10-CM | POA: Diagnosis present

## 2022-12-13 DIAGNOSIS — Z841 Family history of disorders of kidney and ureter: Secondary | ICD-10-CM

## 2022-12-13 DIAGNOSIS — T82594A Other mechanical complication of infusion catheter, initial encounter: Secondary | ICD-10-CM | POA: Diagnosis not present

## 2022-12-13 DIAGNOSIS — Z978 Presence of other specified devices: Secondary | ICD-10-CM

## 2022-12-13 DIAGNOSIS — D631 Anemia in chronic kidney disease: Secondary | ICD-10-CM | POA: Diagnosis present

## 2022-12-13 DIAGNOSIS — I4821 Permanent atrial fibrillation: Secondary | ICD-10-CM | POA: Diagnosis present

## 2022-12-13 DIAGNOSIS — Z992 Dependence on renal dialysis: Secondary | ICD-10-CM

## 2022-12-13 DIAGNOSIS — G9341 Metabolic encephalopathy: Secondary | ICD-10-CM | POA: Diagnosis present

## 2022-12-13 DIAGNOSIS — Y710 Diagnostic and monitoring cardiovascular devices associated with adverse incidents: Secondary | ICD-10-CM | POA: Diagnosis not present

## 2022-12-13 DIAGNOSIS — Z8701 Personal history of pneumonia (recurrent): Secondary | ICD-10-CM

## 2022-12-13 DIAGNOSIS — E113511 Type 2 diabetes mellitus with proliferative diabetic retinopathy with macular edema, right eye: Secondary | ICD-10-CM | POA: Diagnosis present

## 2022-12-13 DIAGNOSIS — D696 Thrombocytopenia, unspecified: Secondary | ICD-10-CM | POA: Diagnosis present

## 2022-12-13 DIAGNOSIS — I9589 Other hypotension: Secondary | ICD-10-CM | POA: Diagnosis present

## 2022-12-13 DIAGNOSIS — L89620 Pressure ulcer of left heel, unstageable: Secondary | ICD-10-CM | POA: Diagnosis present

## 2022-12-13 DIAGNOSIS — K626 Ulcer of anus and rectum: Secondary | ICD-10-CM | POA: Diagnosis present

## 2022-12-13 DIAGNOSIS — Z8719 Personal history of other diseases of the digestive system: Secondary | ICD-10-CM

## 2022-12-13 DIAGNOSIS — Z6826 Body mass index (BMI) 26.0-26.9, adult: Secondary | ICD-10-CM

## 2022-12-13 DIAGNOSIS — Z79899 Other long term (current) drug therapy: Secondary | ICD-10-CM

## 2022-12-13 DIAGNOSIS — R519 Headache, unspecified: Secondary | ICD-10-CM | POA: Diagnosis not present

## 2022-12-13 DIAGNOSIS — K317 Polyp of stomach and duodenum: Secondary | ICD-10-CM | POA: Diagnosis present

## 2022-12-13 DIAGNOSIS — K625 Hemorrhage of anus and rectum: Secondary | ICD-10-CM | POA: Diagnosis not present

## 2022-12-13 DIAGNOSIS — E1122 Type 2 diabetes mellitus with diabetic chronic kidney disease: Secondary | ICD-10-CM | POA: Diagnosis present

## 2022-12-13 DIAGNOSIS — E1142 Type 2 diabetes mellitus with diabetic polyneuropathy: Secondary | ICD-10-CM | POA: Diagnosis present

## 2022-12-13 DIAGNOSIS — K5521 Angiodysplasia of colon with hemorrhage: Secondary | ICD-10-CM | POA: Diagnosis present

## 2022-12-13 DIAGNOSIS — R579 Shock, unspecified: Secondary | ICD-10-CM

## 2022-12-13 DIAGNOSIS — E875 Hyperkalemia: Secondary | ICD-10-CM | POA: Diagnosis present

## 2022-12-13 DIAGNOSIS — I252 Old myocardial infarction: Secondary | ICD-10-CM

## 2022-12-13 DIAGNOSIS — I350 Nonrheumatic aortic (valve) stenosis: Secondary | ICD-10-CM | POA: Diagnosis present

## 2022-12-13 DIAGNOSIS — M898X9 Other specified disorders of bone, unspecified site: Secondary | ICD-10-CM | POA: Diagnosis present

## 2022-12-13 DIAGNOSIS — Z923 Personal history of irradiation: Secondary | ICD-10-CM

## 2022-12-13 DIAGNOSIS — Z7901 Long term (current) use of anticoagulants: Secondary | ICD-10-CM

## 2022-12-13 DIAGNOSIS — Z66 Do not resuscitate: Secondary | ICD-10-CM | POA: Diagnosis present

## 2022-12-13 DIAGNOSIS — I4892 Unspecified atrial flutter: Secondary | ICD-10-CM | POA: Diagnosis present

## 2022-12-13 DIAGNOSIS — T45515A Adverse effect of anticoagulants, initial encounter: Secondary | ICD-10-CM | POA: Diagnosis present

## 2022-12-13 DIAGNOSIS — I120 Hypertensive chronic kidney disease with stage 5 chronic kidney disease or end stage renal disease: Secondary | ICD-10-CM | POA: Diagnosis present

## 2022-12-13 DIAGNOSIS — E871 Hypo-osmolality and hyponatremia: Secondary | ICD-10-CM | POA: Diagnosis present

## 2022-12-13 DIAGNOSIS — E785 Hyperlipidemia, unspecified: Secondary | ICD-10-CM | POA: Diagnosis present

## 2022-12-13 DIAGNOSIS — N186 End stage renal disease: Secondary | ICD-10-CM | POA: Diagnosis present

## 2022-12-13 DIAGNOSIS — L89610 Pressure ulcer of right heel, unstageable: Secondary | ICD-10-CM | POA: Diagnosis present

## 2022-12-13 DIAGNOSIS — D6832 Hemorrhagic disorder due to extrinsic circulating anticoagulants: Secondary | ICD-10-CM | POA: Diagnosis present

## 2022-12-13 DIAGNOSIS — K219 Gastro-esophageal reflux disease without esophagitis: Secondary | ICD-10-CM | POA: Diagnosis present

## 2022-12-13 DIAGNOSIS — R011 Cardiac murmur, unspecified: Secondary | ICD-10-CM | POA: Diagnosis not present

## 2022-12-13 DIAGNOSIS — N2581 Secondary hyperparathyroidism of renal origin: Secondary | ICD-10-CM | POA: Diagnosis present

## 2022-12-13 LAB — I-STAT CHEM 8, ED
BUN: 36 mg/dL — ABNORMAL HIGH (ref 8–23)
Calcium, Ion: 1.14 mmol/L — ABNORMAL LOW (ref 1.15–1.40)
Chloride: 97 mmol/L — ABNORMAL LOW (ref 98–111)
Creatinine, Ser: 7 mg/dL — ABNORMAL HIGH (ref 0.61–1.24)
Glucose, Bld: 101 mg/dL — ABNORMAL HIGH (ref 70–99)
HCT: 41 % (ref 39.0–52.0)
Hemoglobin: 13.9 g/dL (ref 13.0–17.0)
Potassium: 4.4 mmol/L (ref 3.5–5.1)
Sodium: 140 mmol/L (ref 135–145)
TCO2: 35 mmol/L — ABNORMAL HIGH (ref 22–32)

## 2022-12-13 MED ORDER — PROTHROMBIN COMPLEX CONC HUMAN 500 UNITS IV KIT
5390.0000 [IU] | PACK | Status: AC
  Administered 2022-12-14: 5390 [IU] via INTRAVENOUS
  Filled 2022-12-13: qty 5390

## 2022-12-13 MED ORDER — SODIUM CHLORIDE 0.9% IV SOLUTION: Freq: Once | INTRAVENOUS | Status: AC

## 2022-12-13 NOTE — ED Triage Notes (Signed)
Pt arrives from [facility] facility. Hx of prostate/colon ca, dialysis pt. Started having rectal bleeding at 2130. Had large blood clot and bleeding for PTAR during transport. On eliquis. Denies pain. 110/70, repeat 94/62, 100% 3 liters, HR 113, RR 18, cbg 128. Unable to establish IV. A/O x 4. Last stool prior to today was yesterday.

## 2022-12-13 NOTE — ED Provider Notes (Incomplete)
[place] EMERGENCY DEPARTMENT AT [facility] Provider Note   CSN: [id number] Arrival date & time: 12/13/22  2313     History {Add pertinent medical, surgical, social history, OB history to HPI:1} Chief Complaint  Patient presents with  . Rectal Bleeding    Justin [name] is a 77 y.o. male.  27 old male brought in by EMS from [facility] facility for rectal bleeding onset 9:30 PM tonight.  Patient is on Eliquis.  Patient states this is the third time this has happened.  He is a dialysis patient, attends Monday/Wednesday/Friday with last full session being last Friday. 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 EMS reports BP 110/70 initially, 94/62 on repeat.        Home Medications Prior to Admission medications   Medication Sig Start Date End Date Taking? Authorizing Provider  acetaminophen (TYLENOL) 325 MG tablet Take 2 tablets (650 mg total) by mouth every 6 (six) hours as needed for mild pain (or Fever >/= 101). 11/24/22   Pokhrel, Rebekah Chesterfield, MD  atorvastatin (LIPITOR) 40 MG tablet Take 40 mg by mouth daily.  Patient not taking: Reported on 11/09/2022 10/30/14   [provider]  b complex-vitamin c-folic acid (NEPHRO-VITE) 0.8 MG TABS tablet Take 1 tablet by mouth at bedtime.    [provider]  calcitRIOL (ROCALTROL) 0.25 MCG capsule Take 3 capsules (0.75 mcg total) by mouth every Monday, Wednesday, and Friday with hemodialysis. 11/25/22   Pokhrel, Rebekah Chesterfield, MD  cinacalcet (SENSIPAR) 30 MG tablet Take 30 mg by mouth daily. Patient not taking: Reported on 11/09/2022 08/25/22   [provider]  ELIQUIS 5 MG TABS tablet Take 1 tablet (5 mg total) by mouth 2 (two) times daily. Resume Your Eliquis after 5 days 09/23/22   Lanae Boast, MD  midodrine (PROAMATINE) 5 MG tablet Take 3 tablets (15 mg total) by  mouth in the morning, at noon, and at bedtime. Prior to dialysis M-W-F 11/24/22   Pokhrel, Rebekah Chesterfield, MD  Olopatadine HCl (PATADAY) 0.2 % SOLN Place 1 drop into both eyes daily.    [provider]  pantoprazole (PROTONIX) 40 MG tablet Take 1 tablet (40 mg total) by mouth 2 (two) times daily before a meal. 11/24/22   Pokhrel, Rebekah Chesterfield, MD  pregabalin (LYRICA) 75 MG capsule Take 1 capsule (75 mg total) by mouth at bedtime. 11/24/22 11/24/23  Pokhrel, Rebekah Chesterfield, MD  traZODone (DESYREL) 50 MG tablet Take 1 tablet (50 mg total) by mouth at bedtime as needed for sleep. 11/24/22   Pokhrel, Rebekah Chesterfield, MD  VELPHORO 500 MG chewable tablet Chew 500 mg by mouth 3 (three) times daily with meals. 06/03/22   [provider]      Allergies    Patient has no known allergies.    Review of Systems   Review of Systems Negative except as per HPI Physical Exam Updated Vital Signs BP (!) 134/103   Pulse (!) 117   Temp 97.7 F (36.5 C) (Oral)   Resp 15   Ht 5\' 10"  (1.778 m)   Wt 104.3 kg   SpO2 100%   BMI 33.00 kg/m  Physical Exam  ED Results / Procedures / Treatments   Labs (all labs ordered are listed, but only abnormal results are displayed) Labs Reviewed  COMPREHENSIVE METABOLIC PANEL  CBC  POC OCCULT BLOOD, ED  TYPE AND SCREEN    EKG None  Radiology No results found.  Procedures Procedures  {Document cardiac monitor, telemetry assessment procedure when appropriate:1}  Medications Ordered in ED Medications - No data to display  ED Course/ Medical Decision Making/ A&P   {   Click here for ABCD2, HEART and other calculatorsREFRESH Note before signing :1}                          Medical Decision Making Amount and/or Complexity of Data Reviewed Labs: ordered.   ***  {Document critical care time when appropriate:1} {Document review of labs and clinical decision tools ie heart score, Chads2Vasc2 etc:1}  {Document your independent review of radiology images, and any outside  records:1} {Document your discussion with family members, caretakers, and with consultants:1} {Document social determinants of health affecting pt's care:1} {Document your decision making why or why not admission, treatments were needed:1} Final Clinical Impression(s) / ED Diagnoses Final diagnoses:  None    Rx / DC Orders ED Discharge Orders     None

## 2022-12-13 NOTE — ED Provider Notes (Signed)
[place] EMERGENCY DEPARTMENT AT [facility] Provider Note   CSN: [id number] Arrival date & time: 12/13/22  2313     History  Chief Complaint  Patient presents with   Rectal Bleeding    Justin [name] is a 77 y.o. male.  15 old male brought in by EMS from [facility] facility for rectal bleeding onset 9:30 PM tonight.  Patient is on Eliquis.  Patient states this is the third time this has happened.  He is a dialysis patient, attends Monday/Wednesday/Friday with last full session being last Friday. 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 EMS reports BP 110/70 initially, 94/62 on repeat.        Home Medications Prior to Admission medications   Medication Sig Start Date End Date Taking? Authorizing Provider  acetaminophen (TYLENOL) 325 MG tablet Take 2 tablets (650 mg total) by mouth every 6 (six) hours as needed for mild pain (or Fever >/= 101). 11/24/22   Pokhrel, Rebekah Chesterfield, MD  atorvastatin (LIPITOR) 40 MG tablet Take 40 mg by mouth daily.  Patient not taking: Reported on 11/09/2022 10/30/14   [provider]  b complex-vitamin c-folic acid (NEPHRO-VITE) 0.8 MG TABS tablet Take 1 tablet by mouth at bedtime.    [provider]  calcitRIOL (ROCALTROL) 0.25 MCG capsule Take 3 capsules (0.75 mcg total) by mouth every Monday, Wednesday, and Friday with hemodialysis. 11/25/22   Pokhrel, Rebekah Chesterfield, MD  cinacalcet (SENSIPAR) 30 MG tablet Take 30 mg by mouth daily. Patient not taking: Reported on 11/09/2022 08/25/22   [provider]  ELIQUIS 5 MG TABS tablet Take 1 tablet (5 mg total) by mouth 2 (two) times daily. Resume Your Eliquis after 5 days 09/23/22   Lanae Boast, MD  midodrine (PROAMATINE) 5 MG tablet Take 3 tablets (15 mg total) by mouth in the morning, at noon, and at bedtime. Prior to dialysis M-W-F  11/24/22   Pokhrel, Rebekah Chesterfield, MD  Olopatadine HCl (PATADAY) 0.2 % SOLN Place 1 drop into both eyes daily.    [provider]  pantoprazole (PROTONIX) 40 MG tablet Take 1 tablet (40 mg total) by mouth 2 (two) times daily before a meal. 11/24/22   Pokhrel, Rebekah Chesterfield, MD  pregabalin (LYRICA) 75 MG capsule Take 1 capsule (75 mg total) by mouth at bedtime. 11/24/22 11/24/23  Pokhrel, Rebekah Chesterfield, MD  traZODone (DESYREL) 50 MG tablet Take 1 tablet (50 mg total) by mouth at bedtime as needed for sleep. 11/24/22   Pokhrel, Rebekah Chesterfield, MD  VELPHORO 500 MG chewable tablet Chew 500 mg by mouth 3 (three) times daily with meals. 06/03/22   [provider]      Allergies    Patient has no known allergies.    Review of Systems   Review of Systems Negative except as per HPI Physical Exam Updated Vital Signs BP (!) 82/49   Pulse (!) 111   Temp (!) 97 F (36.1 C)   Resp 16   Ht 5\' 10"  (1.778 m)   Wt 104.3 kg   SpO2 100%   BMI 33.00 kg/m  Physical Exam Vitals and nursing note reviewed.  Constitutional:      General: He is not in acute distress.    Appearance: He is well-developed. He is ill-appearing. He is not diaphoretic.  HENT:     Head: Normocephalic and atraumatic.  Cardiovascular:     Rate and Rhythm:  Tachycardia present. Rhythm irregular.     Heart sounds: Murmur heard.  Pulmonary:     Effort: Pulmonary effort is normal.     Breath sounds: Normal breath sounds.  Abdominal:     Palpations: Abdomen is soft.     Tenderness: There is no abdominal tenderness.     Comments: RUQ drain  Musculoskeletal:     Right lower leg: No edema.     Left lower leg: No edema.     Comments: Left upper arm fistula   Skin:    General: Skin is warm and dry.     Findings: No erythema or rash.  Neurological:     Mental Status: He is alert and oriented to person, place, and time.  Psychiatric:        Behavior: Behavior normal.     ED Results / Procedures / Treatments   Labs (all labs ordered are  listed, but only abnormal results are displayed) Labs Reviewed  COMPREHENSIVE METABOLIC PANEL - Abnormal; Notable for the following components:      Result Value   Glucose, Bld 103 (*)    Creatinine, Ser 6.76 (*)    Calcium 8.2 (*)    Total Protein 6.0 (*)    Albumin 1.9 (*)    GFR, Estimated 8 (*)    All other components within normal limits  CBC - Abnormal; Notable for the following components:   RBC 2.92 (*)    Hemoglobin 8.4 (*)    HCT 28.3 (*)    MCHC 29.7 (*)    RDW 19.1 (*)    All other components within normal limits  PROTIME-INR - Abnormal; Notable for the following components:   Prothrombin Time 28.2 (*)    INR 2.6 (*)    All other components within normal limits  APTT - Abnormal; Notable for the following components:   aPTT 42 (*)    All other components within normal limits  DIC (DISSEMINATED INTRAVASCULAR COAGULATION)PANEL - Abnormal; Notable for the following components:   Prothrombin Time 21.6 (*)    INR 1.9 (*)    aPTT 39 (*)    Fibrinogen 181 (*)    Platelets 132 (*)    All other components within normal limits  CBC - Abnormal; Notable for the following components:   RBC 3.27 (*)    Hemoglobin 10.0 (*)    HCT 31.3 (*)    RDW 17.9 (*)    Platelets 145 (*)    nRBC 0.3 (*)    All other components within normal limits  BASIC METABOLIC PANEL - Abnormal; Notable for the following components:   Potassium 3.2 (*)    Glucose, Bld 115 (*)    Creatinine, Ser 6.46 (*)    Calcium 7.8 (*)    GFR, Estimated 8 (*)    All other components within normal limits  I-STAT CHEM 8, ED - Abnormal; Notable for the following components:   Chloride 97 (*)    BUN 36 (*)    Creatinine, Ser 7.00 (*)    Glucose, Bld 101 (*)    Calcium, Ion 1.14 (*)    TCO2 35 (*)    All other components within normal limits  LACTIC ACID, PLASMA  HEMOGLOBIN AND HEMATOCRIT, BLOOD  CBC  CBC  CBC  PROTIME-INR  PROTIME-INR  PROTIME-INR  LACTIC ACID, PLASMA  TYPE AND SCREEN  PREPARE RBC  (CROSSMATCH)  PREPARE RBC (CROSSMATCH)  PREPARE FRESH FROZEN PLASMA    EKG None  Radiology No results found.  Procedures .Critical Care  Performed by: Jeannie Fend, PA-C Authorized by: Jeannie Fend, PA-C   Critical care provider statement:    Critical care time (minutes):  90   Critical care was time spent personally by me on the following activities:  Development of treatment plan with patient or surrogate, discussions with consultants, evaluation of patient's response to treatment, examination of patient, ordering and review of laboratory studies, ordering and review of radiographic studies, ordering and performing treatments and interventions, pulse oximetry, re-evaluation of patient's condition and review of old charts     Medications Ordered in ED Medications  norepinephrine (LEVOPHED) 4mg  in (0.016 mg/mL) premix infusion (0 mcg/min Intravenous Stopped 12/14/22 0301)  0.9 %  sodium chloride infusion (has no administration in time range)  docusate sodium (COLACE) capsule 100 mg (has no administration in time range)  polyethylene glycol (MIRALAX / GLYCOLAX) packet 17 g (has no administration in time range)  pantoprazole (PROTONIX) injection 40 mg (40 mg Intravenous Given 12/14/22 0332)  potassium chloride 10 mEq in 100 mL IVPB (has no administration in time range)  0.9 %  sodium chloride infusion (Manually program via Guardrails IV Fluids) (0 mLs Intravenous Stopped 12/14/22 0028)  prothrombin complex conc human (KCENTRA) IVPB 5,390 Units (0 Units Intravenous Stopped 12/14/22 0155)  0.9 %  sodium chloride infusion (Manually program via Guardrails IV Fluids) (0 mLs Intravenous Stopped 12/14/22 0108)  diphenhydrAMINE (BENADRYL) injection 25 mg (25 mg Intravenous Given 12/14/22 0024)  acetaminophen (TYLENOL) tablet 650 mg (650 mg Oral Given 12/14/22 0024)  0.9 %  sodium chloride infusion (Manually program via Guardrails IV Fluids) (0 mLs Intravenous Stopped 12/14/22 0503)  calcium  gluconate 1 g/ 50 mL sodium chloride IVPB (0 mg Intravenous Stopped 12/14/22 0503)  0.9 %  sodium chloride infusion (Manually program via Guardrails IV Fluids) ( Intravenous New Bag/Given 12/14/22 0418)    ED Course/ Medical Decision Making/ A&P                             Medical Decision Making Amount and/or Complexity of Data Reviewed Labs: ordered. Radiology: ordered.  Risk OTC drugs. Prescription drug management. Decision regarding hospitalization.   This patient presents to the ED for concern of GI bleed, this involves an extensive number of treatment options, and is a complaint that carries with it a high risk of complications and morbidity.  The differential diagnosis includes but not limited to acute blood loss anemia, shock, GI bleed   Co morbidities that complicate the patient evaluation  ESRD on dialysis, GI bleed, DM, afib on Eliquis   Additional history obtained:  Additional history obtained from niece and sister at bedside who contribute to history as above External records from outside source obtained and reviewed including DC summary dated 11/24/22, admission dated 11/08/22. Admission for acute on chronic blood loss anemia, ESRD, DM, hypotension, Acute GI bleed, acute encephalopathy.   Lab Tests:  I Ordered, and personally interpreted labs.  The pertinent results include:  INR 2.6. PTT 42. CMP with Cr 6.76, not significantly changed from prior. I-stat chem 8 with hgb 13.9- not likely accurate with patient's known anemia and hgb baseline 8 with significant GI blood loss tonight. Initial CBC with hgb 8.4.   Imaging Studies ordered:  I ordered imaging studies including CTA GI bleed study  Patient has not been stable enough to complete this study     Consultations Obtained:  I requested consultation with the  ER attending Dr. Blinda Leatherwood,  and discussed lab and imaging findings as well as pertinent plan - they recommend: emergency release PRBCs, tylenol and benadryl  for known antibodies.    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  Patient was hypotensive with EMS, tachycardic, blood pressure on arrival of 134/103 with pulse of 117. Patient was found to have a diaper full of dark red blood, blood pressure dropped to low 60s systolic.  Evaluated by ER attending, emergency blood ordered, discussed with patient.  Eliquis reversed.  Discussed goals of care with patient.  He would not like to be intubated or defibrilated.  Central line placed by ER attending. Art line placed for more accurate BP monitoring.  BP maintained with PRBCs and gentle fluids, norepi occasionally. Due to ESRD, unable to bolus with fluids. Consulted critical care who will consult for admission.  I ordered medication including kcentra for reversal of Eliquis due to GI hemorrhage, PRBC for blood loss anemia Reevaluation of the patient after these medicines showed that the patient stayed the same I have reviewed the patients home medicines and have made adjustments as needed   Social Determinants of Health:  Lives at SNF   Test / Admission - Considered:  Admit to critical care service          Final Clinical Impression(s) / ED Diagnoses Final diagnoses:  Gastrointestinal hemorrhage, unspecified gastrointestinal hemorrhage type  Hemorrhagic shock (HCC)  End stage renal disease [facility])    Rx / DC Orders ED Discharge Orders     None         Jeannie Fend, PA-C 12/14/22 [id number]    Gilda Crease, MD 12/14/22 [phone]

## 2022-12-14 ENCOUNTER — Inpatient Hospital Stay (HOSPITAL_COMMUNITY): Payer: Medicare Other

## 2022-12-14 DIAGNOSIS — I480 Paroxysmal atrial fibrillation: Secondary | ICD-10-CM | POA: Diagnosis not present

## 2022-12-14 DIAGNOSIS — K626 Ulcer of anus and rectum: Secondary | ICD-10-CM | POA: Diagnosis present

## 2022-12-14 DIAGNOSIS — I953 Hypotension of hemodialysis: Secondary | ICD-10-CM | POA: Diagnosis not present

## 2022-12-14 DIAGNOSIS — D6832 Hemorrhagic disorder due to extrinsic circulating anticoagulants: Secondary | ICD-10-CM | POA: Diagnosis present

## 2022-12-14 DIAGNOSIS — R579 Shock, unspecified: Secondary | ICD-10-CM | POA: Diagnosis not present

## 2022-12-14 DIAGNOSIS — K922 Gastrointestinal hemorrhage, unspecified: Secondary | ICD-10-CM | POA: Diagnosis not present

## 2022-12-14 DIAGNOSIS — K317 Polyp of stomach and duodenum: Secondary | ICD-10-CM | POA: Diagnosis not present

## 2022-12-14 DIAGNOSIS — I120 Hypertensive chronic kidney disease with stage 5 chronic kidney disease or end stage renal disease: Secondary | ICD-10-CM | POA: Diagnosis present

## 2022-12-14 DIAGNOSIS — I8500 Esophageal varices without bleeding: Secondary | ICD-10-CM | POA: Diagnosis present

## 2022-12-14 DIAGNOSIS — I4892 Unspecified atrial flutter: Secondary | ICD-10-CM | POA: Diagnosis present

## 2022-12-14 DIAGNOSIS — T82594A Other mechanical complication of infusion catheter, initial encounter: Secondary | ICD-10-CM | POA: Diagnosis not present

## 2022-12-14 DIAGNOSIS — D62 Acute posthemorrhagic anemia: Secondary | ICD-10-CM | POA: Diagnosis present

## 2022-12-14 DIAGNOSIS — Y710 Diagnostic and monitoring cardiovascular devices associated with adverse incidents: Secondary | ICD-10-CM | POA: Diagnosis not present

## 2022-12-14 DIAGNOSIS — R578 Other shock: Secondary | ICD-10-CM | POA: Diagnosis present

## 2022-12-14 DIAGNOSIS — I350 Nonrheumatic aortic (valve) stenosis: Secondary | ICD-10-CM | POA: Diagnosis present

## 2022-12-14 DIAGNOSIS — Z66 Do not resuscitate: Secondary | ICD-10-CM | POA: Diagnosis present

## 2022-12-14 DIAGNOSIS — N186 End stage renal disease: Secondary | ICD-10-CM | POA: Diagnosis present

## 2022-12-14 DIAGNOSIS — E871 Hypo-osmolality and hyponatremia: Secondary | ICD-10-CM | POA: Diagnosis present

## 2022-12-14 DIAGNOSIS — K5521 Angiodysplasia of colon with hemorrhage: Secondary | ICD-10-CM | POA: Diagnosis present

## 2022-12-14 DIAGNOSIS — D696 Thrombocytopenia, unspecified: Secondary | ICD-10-CM | POA: Diagnosis present

## 2022-12-14 DIAGNOSIS — K625 Hemorrhage of anus and rectum: Secondary | ICD-10-CM | POA: Diagnosis present

## 2022-12-14 DIAGNOSIS — R571 Hypovolemic shock: Secondary | ICD-10-CM

## 2022-12-14 DIAGNOSIS — I9589 Other hypotension: Secondary | ICD-10-CM | POA: Diagnosis present

## 2022-12-14 DIAGNOSIS — I4811 Longstanding persistent atrial fibrillation: Secondary | ICD-10-CM | POA: Diagnosis not present

## 2022-12-14 DIAGNOSIS — G9341 Metabolic encephalopathy: Secondary | ICD-10-CM | POA: Diagnosis present

## 2022-12-14 DIAGNOSIS — E113511 Type 2 diabetes mellitus with proliferative diabetic retinopathy with macular edema, right eye: Secondary | ICD-10-CM | POA: Diagnosis present

## 2022-12-14 DIAGNOSIS — K921 Melena: Secondary | ICD-10-CM | POA: Diagnosis not present

## 2022-12-14 DIAGNOSIS — I950 Idiopathic hypotension: Secondary | ICD-10-CM | POA: Diagnosis not present

## 2022-12-14 DIAGNOSIS — D631 Anemia in chronic kidney disease: Secondary | ICD-10-CM | POA: Diagnosis present

## 2022-12-14 DIAGNOSIS — K31811 Angiodysplasia of stomach and duodenum with bleeding: Secondary | ICD-10-CM | POA: Diagnosis not present

## 2022-12-14 DIAGNOSIS — Z7901 Long term (current) use of anticoagulants: Secondary | ICD-10-CM | POA: Diagnosis not present

## 2022-12-14 DIAGNOSIS — N2581 Secondary hyperparathyroidism of renal origin: Secondary | ICD-10-CM | POA: Diagnosis present

## 2022-12-14 DIAGNOSIS — D120 Benign neoplasm of cecum: Secondary | ICD-10-CM | POA: Diagnosis not present

## 2022-12-14 DIAGNOSIS — E1122 Type 2 diabetes mellitus with diabetic chronic kidney disease: Secondary | ICD-10-CM | POA: Diagnosis present

## 2022-12-14 DIAGNOSIS — Z992 Dependence on renal dialysis: Secondary | ICD-10-CM | POA: Diagnosis not present

## 2022-12-14 DIAGNOSIS — I4821 Permanent atrial fibrillation: Secondary | ICD-10-CM | POA: Diagnosis present

## 2022-12-14 DIAGNOSIS — I864 Gastric varices: Secondary | ICD-10-CM | POA: Diagnosis present

## 2022-12-14 LAB — COMPREHENSIVE METABOLIC PANEL
ALT: 15 U/L (ref 0–44)
AST: 30 U/L (ref 15–41)
Albumin: 1.9 g/dL — ABNORMAL LOW (ref 3.5–5.0)
Alkaline Phosphatase: 62 U/L (ref 38–126)
Anion gap: 9 (ref 5–15)
BUN: 23 mg/dL (ref 8–23)
CO2: 30 mmol/L (ref 22–32)
Calcium: 8.2 mg/dL — ABNORMAL LOW (ref 8.9–10.3)
Chloride: 98 mmol/L (ref 98–111)
Creatinine, Ser: 6.76 mg/dL — ABNORMAL HIGH (ref 0.61–1.24)
GFR, Estimated: 8 mL/min — ABNORMAL LOW (ref >60)
Glucose, Bld: 103 mg/dL — ABNORMAL HIGH (ref 70–99)
Potassium: 3.5 mmol/L (ref 3.5–5.1)
Sodium: 137 mmol/L (ref 135–145)
Total Bilirubin: 0.8 mg/dL (ref 0.3–1.2)
Total Protein: 6 g/dL — ABNORMAL LOW (ref 6.5–8.1)

## 2022-12-14 LAB — BPAM RBC
Blood Product Expiration Date: 202406292359
Blood Product Expiration Date: 202406292359
Blood Product Expiration Date: 202406292359
Unit Type and Rh: 5100
Unit Type and Rh: 5100
Unit Type and Rh: 5100
Unit Type and Rh: 5100
Unit Type and Rh: 5100

## 2022-12-14 LAB — DIC (DISSEMINATED INTRAVASCULAR COAGULATION)PANEL
D-Dimer, Quant: 0.29 ug/mL-FEU (ref 0.00–0.50)
Fibrinogen: 181 mg/dL — ABNORMAL LOW (ref 210–475)
INR: 1.9 — ABNORMAL HIGH (ref 0.8–1.2)
Platelets: 132 10*3/uL — ABNORMAL LOW (ref 150–400)
Prothrombin Time: 21.6 seconds — ABNORMAL HIGH (ref 11.4–15.2)
Smear Review: NONE SEEN
aPTT: 39 seconds — ABNORMAL HIGH (ref 24–36)

## 2022-12-14 LAB — PROTIME-INR
INR: 1.5 — ABNORMAL HIGH (ref 0.8–1.2)
INR: 1.6 — ABNORMAL HIGH (ref 0.8–1.2)
INR: 2.6 — ABNORMAL HIGH (ref 0.8–1.2)
Prothrombin Time: 18.4 seconds — ABNORMAL HIGH (ref 11.4–15.2)
Prothrombin Time: 19.4 seconds — ABNORMAL HIGH (ref 11.4–15.2)
Prothrombin Time: 28.2 seconds — ABNORMAL HIGH (ref 11.4–15.2)

## 2022-12-14 LAB — MRSA NEXT GEN BY PCR, NASAL: MRSA by PCR Next Gen: NOT DETECTED

## 2022-12-14 LAB — BASIC METABOLIC PANEL
Anion gap: 11 (ref 5–15)
BUN: 22 mg/dL (ref 8–23)
CO2: 27 mmol/L (ref 22–32)
Calcium: 7.8 mg/dL — ABNORMAL LOW (ref 8.9–10.3)
Chloride: 99 mmol/L (ref 98–111)
Creatinine, Ser: 6.46 mg/dL — ABNORMAL HIGH (ref 0.61–1.24)
GFR, Estimated: 8 mL/min — ABNORMAL LOW (ref >60)
Glucose, Bld: 115 mg/dL — ABNORMAL HIGH (ref 70–99)
Potassium: 3.2 mmol/L — ABNORMAL LOW (ref 3.5–5.1)
Sodium: 137 mmol/L (ref 135–145)

## 2022-12-14 LAB — CBC
HCT: 28.3 % — ABNORMAL LOW (ref 39.0–52.0)
HCT: 31.3 % — ABNORMAL LOW (ref 39.0–52.0)
HCT: 33.7 % — ABNORMAL LOW (ref 39.0–52.0)
HCT: 33.9 % — ABNORMAL LOW (ref 39.0–52.0)
Hemoglobin: 10 g/dL — ABNORMAL LOW (ref 13.0–17.0)
Hemoglobin: 11 g/dL — ABNORMAL LOW (ref 13.0–17.0)
Hemoglobin: 11.1 g/dL — ABNORMAL LOW (ref 13.0–17.0)
Hemoglobin: 8.4 g/dL — ABNORMAL LOW (ref 13.0–17.0)
MCH: 28.8 pg (ref 26.0–34.0)
MCH: 29.7 pg (ref 26.0–34.0)
MCH: 30.6 pg (ref 26.0–34.0)
MCH: 30.8 pg (ref 26.0–34.0)
MCHC: 29.7 g/dL — ABNORMAL LOW (ref 30.0–36.0)
MCHC: 31.9 g/dL (ref 30.0–36.0)
MCHC: 32.4 g/dL (ref 30.0–36.0)
MCHC: 32.9 g/dL (ref 30.0–36.0)
MCV: 91.6 fL (ref 80.0–100.0)
MCV: 93.6 fL (ref 80.0–100.0)
MCV: 95.7 fL (ref 80.0–100.0)
MCV: 96.9 fL (ref 80.0–100.0)
Platelets: 133 10*3/uL — ABNORMAL LOW (ref 150–400)
Platelets: 136 10*3/uL — ABNORMAL LOW (ref 150–400)
Platelets: 145 10*3/uL — ABNORMAL LOW (ref 150–400)
Platelets: 205 10*3/uL (ref 150–400)
RBC: 2.92 MIL/uL — ABNORMAL LOW (ref 4.22–5.81)
RBC: 3.27 MIL/uL — ABNORMAL LOW (ref 4.22–5.81)
RBC: 3.6 MIL/uL — ABNORMAL LOW (ref 4.22–5.81)
RBC: 3.7 MIL/uL — ABNORMAL LOW (ref 4.22–5.81)
RDW: 17.9 % — ABNORMAL HIGH (ref 11.5–15.5)
RDW: 18.6 % — ABNORMAL HIGH (ref 11.5–15.5)
RDW: 18.6 % — ABNORMAL HIGH (ref 11.5–15.5)
RDW: 19.1 % — ABNORMAL HIGH (ref 11.5–15.5)
WBC: 10.2 10*3/uL (ref 4.0–10.5)
WBC: 10.3 10*3/uL (ref 4.0–10.5)
WBC: 10.6 10*3/uL — ABNORMAL HIGH (ref 4.0–10.5)
WBC: 11.1 10*3/uL — ABNORMAL HIGH (ref 4.0–10.5)
nRBC: 0 % (ref 0.0–0.2)
nRBC: 0 % (ref 0.0–0.2)
nRBC: 0 % (ref 0.0–0.2)
nRBC: 0.3 % — ABNORMAL HIGH (ref 0.0–0.2)

## 2022-12-14 LAB — PREPARE RBC (CROSSMATCH)

## 2022-12-14 LAB — HEMOGLOBIN AND HEMATOCRIT, BLOOD
HCT: 34.6 % — ABNORMAL LOW (ref 39.0–52.0)
Hemoglobin: 11.4 g/dL — ABNORMAL LOW (ref 13.0–17.0)

## 2022-12-14 LAB — TYPE AND SCREEN
ABO/RH(D): O POS
Unit division: 0
Unit division: 0

## 2022-12-14 LAB — GLUCOSE, CAPILLARY
Glucose-Capillary: 115 mg/dL — ABNORMAL HIGH (ref 70–99)
Glucose-Capillary: 77 mg/dL (ref 70–99)
Glucose-Capillary: 86 mg/dL (ref 70–99)

## 2022-12-14 LAB — APTT: aPTT: 42 seconds — ABNORMAL HIGH (ref 24–36)

## 2022-12-14 LAB — BPAM FFP: Unit Type and Rh: 5100

## 2022-12-14 LAB — LACTIC ACID, PLASMA
Lactic Acid, Venous: 1.2 mmol/L (ref 0.5–1.9)
Lactic Acid, Venous: 1.1 mmol/L (ref 0.5–1.9)

## 2022-12-14 LAB — HEPATITIS B SURFACE ANTIGEN: Hepatitis B Surface Ag: NONREACTIVE

## 2022-12-14 LAB — POTASSIUM: Potassium: 3.6 mmol/L (ref 3.5–5.1)

## 2022-12-14 MED ORDER — POTASSIUM CHLORIDE 10 MEQ/50ML IV SOLN
10.0000 meq | INTRAVENOUS | Status: DC
  Administered 2022-12-14 (×2): 10 meq via INTRAVENOUS
  Filled 2022-12-14 (×2): qty 50

## 2022-12-14 MED ORDER — INSULIN ASPART 100 UNIT/ML IJ SOLN: 0.0000 [IU] | INTRAMUSCULAR | Status: DC

## 2022-12-14 MED ORDER — PENTAFLUOROPROP-TETRAFLUOROETH EX AERO: 1.0000 | INHALATION_SPRAY | CUTANEOUS | Status: DC | PRN

## 2022-12-14 MED ORDER — LIDOCAINE-PRILOCAINE 2.5-2.5 % EX CREA: 1.0000 | TOPICAL_CREAM | CUTANEOUS | Status: DC | PRN

## 2022-12-14 MED ORDER — SODIUM CHLORIDE 0.9% IV SOLUTION: Freq: Once | INTRAVENOUS | Status: AC

## 2022-12-14 MED ORDER — PANTOPRAZOLE SODIUM 40 MG IV SOLR
40.0000 mg | Freq: Two times a day (BID) | INTRAVENOUS | Status: DC
  Administered 2022-12-14 – 2022-12-17 (×9): 40 mg via INTRAVENOUS
  Filled 2022-12-14 (×9): qty 10

## 2022-12-14 MED ORDER — POTASSIUM CHLORIDE 10 MEQ/100ML IV SOLN: 10.0000 meq | INTRAVENOUS | Status: DC

## 2022-12-14 MED ORDER — ALBUMIN HUMAN 25 % IV SOLN
25.0000 g | INTRAVENOUS | Status: AC
  Administered 2022-12-14: 25 g via INTRAVENOUS

## 2022-12-14 MED ORDER — DIPHENHYDRAMINE HCL 50 MG/ML IJ SOLN
25.0000 mg | Freq: Once | INTRAMUSCULAR | Status: AC
  Administered 2022-12-14: 25 mg via INTRAVENOUS
  Filled 2022-12-14: qty 1

## 2022-12-14 MED ORDER — LIDOCAINE HCL (PF) 1 % IJ SOLN: 5.0000 mL | INTRAMUSCULAR | Status: DC | PRN

## 2022-12-14 MED ORDER — NOREPINEPHRINE 4 MG/250ML-% IV SOLN
INTRAVENOUS | Status: AC
  Administered 2022-12-14: 4 ug/min via INTRAVENOUS
  Filled 2022-12-14: qty 250

## 2022-12-14 MED ORDER — CALCIUM GLUCONATE-NACL 1-0.675 GM/50ML-% IV SOLN
1.0000 g | Freq: Once | INTRAVENOUS | Status: AC
  Administered 2022-12-14: 1000 mg via INTRAVENOUS
  Filled 2022-12-14: qty 50

## 2022-12-14 MED ORDER — DOCUSATE SODIUM 100 MG PO CAPS
100.0000 mg | ORAL_CAPSULE | Freq: Two times a day (BID) | ORAL | Status: DC | PRN
  Administered 2022-12-23: 100 mg via ORAL
  Filled 2022-12-14: qty 1

## 2022-12-14 MED ORDER — CHLORHEXIDINE GLUCONATE CLOTH 2 % EX PADS
6.0000 | MEDICATED_PAD | Freq: Every day | CUTANEOUS | Status: DC
  Administered 2022-12-14 – 2022-12-17 (×4): 6 via TOPICAL

## 2022-12-14 MED ORDER — SODIUM CHLORIDE 0.9 % IV SOLN: INTRAVENOUS | Status: DC | PRN

## 2022-12-14 MED ORDER — NOREPINEPHRINE 4 MG/250ML-% IV SOLN
0.0000 ug/min | INTRAVENOUS | Status: DC
  Administered 2022-12-16: 4 ug/min via INTRAVENOUS
  Administered 2022-12-16: 6 ug/min via INTRAVENOUS
  Administered 2022-12-17: 3 ug/min via INTRAVENOUS
  Administered 2022-12-18: 4 ug/min via INTRAVENOUS
  Filled 2022-12-14 (×5): qty 250

## 2022-12-14 MED ORDER — POLYETHYLENE GLYCOL 3350 17 G PO PACK: 17.0000 g | PACK | Freq: Every day | ORAL | Status: DC | PRN

## 2022-12-14 MED ORDER — IOHEXOL 350 MG/ML SOLN
100.0000 mL | Freq: Once | INTRAVENOUS | Status: AC | PRN
  Administered 2022-12-14: 100 mL via INTRAVENOUS

## 2022-12-14 MED ORDER — ACETAMINOPHEN 325 MG PO TABS
650.0000 mg | ORAL_TABLET | Freq: Once | ORAL | Status: AC
  Administered 2022-12-14: 650 mg via ORAL
  Filled 2022-12-14: qty 2

## 2022-12-14 NOTE — Consult Note (Signed)
[facility] Renal Consultation Note    Indication for Consultation:  Management of ESRD/hemodialysis; anemia, hypertension/volume and secondary hyperparathyroidism  HPI: Justin [name] is a 77 y.o. 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  He was found to have pneumonia and had UGI bleed and blood transfusion.  He had capsul endoscopy with tiny AVM's vs localized gastritis.  He was resumed on Eliquis and returned to his SNF.  He was noted to have recurrent rectal bleeding and was transferred from [facility] to [facility] ED on 12/13/22.  In the ED, he was tachycardic at 111, hypotensive at 82/49, SpO2 100%.  He received 3 units PRBC, Kcentra, and pressors and was admitted to the ICU.  CT angio of abdomen was negative for active bleeding.  We were consulted to provide dialysis during his hospitalization.  He is currently hemodynamically stable, however did receive a large amount of blood products and has some edema on CT scan.  Pt is currently somnolent and difficult to arouse.  Family is at the bedside and reports that he has been sleeping all day and stays awake at night.  Information for HPI was obtained by review of the EMR and discussion with care team.   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) 12/07/2019   Dialysis patient (HCC)    Gout    Hypercalcemia 11/14/2021   Hyperkalemia 10/19/2018   Hypertension     Hypotension 06/13/2022   Right posterior capsular opacification 02/27/2021   Septic shock (HCC)    Vitreous hemorrhage of right eye (HCC) 10/24/2019   Past Surgical History:  Procedure Laterality Date   A/V FISTULAGRAM Left 08/17/2017   Procedure: A/V FISTULAGRAM;  Surgeon: Renford Dills, MD;  Location: [facility] INVASIVE CV LAB;  Service: Cardiovascular;  Laterality: Left;   A/V SHUNT INTERVENTION N/A 08/17/2017   Procedure: A/V SHUNT INTERVENTION;  Surgeon: Renford Dills, MD;  Location: [facility] INVASIVE CV LAB;  Service: Cardiovascular;  Laterality: N/A;   AV FISTULA PLACEMENT Left 05/31/2015   Procedure: ARTERIOVENOUS (AV) FISTULA CREATION;  Surgeon: Renford Dills, MD;  Location: [facility] ORS;  Service: Vascular;  Laterality: Left;   COLONOSCOPY WITH PROPOFOL N/A 09/11/2022   Procedure: COLONOSCOPY WITH PROPOFOL;  Surgeon: Imogene Burn, MD;  Location: [facility] ENDOSCOPY;  Service: Gastroenterology;  Laterality: N/A;   COLONOSCOPY WITH PROPOFOL N/A 09/17/2022   Procedure: COLONOSCOPY WITH PROPOFOL;  Surgeon: Benancio Deeds, MD;  Location: [facility] ENDOSCOPY;  Service: Gastroenterology;  Laterality: N/A;   ESOPHAGOGASTRODUODENOSCOPY (EGD) WITH PROPOFOL N/A 09/11/2022   Procedure: ESOPHAGOGASTRODUODENOSCOPY (EGD) WITH PROPOFOL;  Surgeon: Imogene Burn, MD;  Location: [facility] ENDOSCOPY;  Service: Gastroenterology;  Laterality: N/A;   excision bone spurs Bilateral 1989   feet   GIVENS CAPSULE STUDY N/A 11/10/2022   Procedure: GIVENS CAPSULE STUDY;  Surgeon: Shellia Cleverly, DO;  Location: [facility] ENDOSCOPY;  Service: Gastroenterology;  Laterality: N/A;   HEMOSTASIS  CLIP PLACEMENT  09/11/2022   Procedure: HEMOSTASIS CLIP PLACEMENT;  Surgeon: Imogene Burn, MD;  Location: [facility] ENDOSCOPY;  Service: Gastroenterology;;   HEMOSTASIS CLIP PLACEMENT  09/17/2022   Procedure: HEMOSTASIS CLIP PLACEMENT;  Surgeon: Benancio Deeds, MD;  Location: [facility] ENDOSCOPY;  Service: Gastroenterology;;   HOT HEMOSTASIS N/A 09/11/2022    Procedure: HOT HEMOSTASIS (ARGON PLASMA COAGULATION/BICAP);  Surgeon: Imogene Burn, MD;  Location: [facility] ENDOSCOPY;  Service: Gastroenterology;  Laterality: N/A;   HOT HEMOSTASIS N/A 09/17/2022   Procedure: HOT HEMOSTASIS (ARGON PLASMA COAGULATION/BICAP);  Surgeon: Benancio Deeds, MD;  Location: [facility] ENDOSCOPY;  Service: Gastroenterology;  Laterality: N/A;   INCISION AND DRAINAGE PERIRECTAL ABSCESS N/A 06/15/2022   Procedure: IRRIGATION AND DEBRIDEMENT PERIRECTAL ABSCESS;  Surgeon: Franky Macho, MD;  Location: [facility] ORS;  Service: General;  Laterality: N/A;   KNEE SURGERY Left 1998   arthroscopy   POLYPECTOMY  09/11/2022   Procedure: POLYPECTOMY;  Surgeon: Imogene Burn, MD;  Location: [facility] ENDOSCOPY;  Service: Gastroenterology;;   SHOULDER SURGERY Left 1994   rotator cuff   Family History:   Family History  Problem Relation Age of Onset   Kidney disease Mother    Alcoholism Father    Alcoholism Brother    Social History:  reports that he quit smoking about 44 years ago. His smoking use included cigarettes. He has never used smokeless tobacco. He reports that he does not drink alcohol and does not use drugs. No Known Allergies Prior to Admission medications   Medication Sig Start Date End Date Taking? Authorizing Provider  acetaminophen (TYLENOL) 325 MG tablet Take 2 tablets (650 mg total) by mouth every 6 (six) hours as needed for mild pain (or Fever >/= 101). 11/24/22   Pokhrel, Rebekah Chesterfield, MD  atorvastatin (LIPITOR) 40 MG tablet Take 40 mg by mouth daily.  Patient not taking: Reported on 11/09/2022 10/30/14   [provider]  b complex-vitamin c-folic acid (NEPHRO-VITE) 0.8 MG TABS tablet Take 1 tablet by mouth at bedtime.    [provider]  calcitRIOL (ROCALTROL) 0.25 MCG capsule Take 3 capsules (0.75 mcg total) by mouth every Monday, Wednesday, and Friday with hemodialysis. 11/25/22   Pokhrel, Rebekah Chesterfield, MD  cinacalcet (SENSIPAR) 30 MG tablet Take 30 mg by mouth daily. Patient  not taking: Reported on 11/09/2022 08/25/22   [provider]  ELIQUIS 5 MG TABS tablet Take 1 tablet (5 mg total) by mouth 2 (two) times daily. Resume Your Eliquis after 5 days 09/23/22   Lanae Boast, MD  midodrine (PROAMATINE) 5 MG tablet Take 3 tablets (15 mg total) by mouth in the morning, at noon, and at bedtime. Prior to dialysis M-W-F 11/24/22   Pokhrel, Rebekah Chesterfield, MD  Olopatadine HCl (PATADAY) 0.2 % SOLN Place 1 drop into both eyes daily.    [provider]  pantoprazole (PROTONIX) 40 MG tablet Take 1 tablet (40 mg total) by mouth 2 (two) times daily before a meal. 11/24/22   Pokhrel, Rebekah Chesterfield, MD  pregabalin (LYRICA) 75 MG capsule Take 1 capsule (75 mg total) by mouth at bedtime. 11/24/22 11/24/23  Pokhrel, Rebekah Chesterfield, MD  traZODone (DESYREL) 50 MG tablet Take 1 tablet (50 mg total) by mouth at bedtime as needed for sleep. 11/24/22   Pokhrel, Rebekah Chesterfield, MD  VELPHORO 500 MG chewable tablet Chew 500 mg by mouth 3 (three) times daily with meals. 06/03/22   [provider]   Current Facility-Administered Medications  Medication Dose Route Frequency Provider Last Rate Last Admin   0.9 %  sodium chloride infusion   Intra-arterial PRN Pollina, Canary Brim, MD       docusate sodium (COLACE) capsule 100 mg  100 mg Oral BID PRN Bowser, Kaylyn Layer, NP       insulin aspart (novoLOG) injection 0-6 Units  0-6 Units Subcutaneous Q4H Coralyn Helling, MD       norepinephrine (LEVOPHED) 4mg  in (0.016 mg/mL) premix infusion  0-40 mcg/min Intravenous Continuous Gilda Crease, MD   Stopped at 12/14/22 0301   pantoprazole (PROTONIX) injection 40 mg  40 mg Intravenous Q12H Lanier Clam, NP   40 mg at 12/14/22 0942   polyethylene glycol (MIRALAX / GLYCOLAX) packet 17 g  17 g Oral Daily PRN Lanier Clam, NP       potassium chloride 10 mEq in 50 mL *CENTRAL LINE* IVPB  10 mEq Intravenous Q1 Hr x 4 Bevelyn Ngo, NP 50 mL/hr at 12/14/22 0941 10 mEq at 12/14/22 0941   Labs: Basic Metabolic  Panel: Recent Labs  Lab 12/13/22 2340 12/13/22 2353 12/14/22 0316  NA 137 140 137  K 3.5 4.4 3.2*  CL 98 97* 99  CO2 30  --  27  GLUCOSE 103* 101* 115*  BUN 23 36* 22  CREATININE 6.76* 7.00* 6.46*  CALCIUM 8.2*  --  7.8*   Liver Function Tests: Recent Labs  Lab 12/13/22 2340  AST 30  ALT 15  ALKPHOS 62  BILITOT 0.8  PROT 6.0*  ALBUMIN 1.9*   No results for input(s): "LIPASE", "AMYLASE" in the last 168 hours. No results for input(s): "AMMONIA" in the last 168 hours. CBC: Recent Labs  Lab 12/13/22 2340 12/13/22 2353 12/14/22 0316 12/14/22 0602  WBC 10.2  --  10.3  --   HGB 8.4* 13.9 10.0* 11.4*  HCT 28.3* 41.0 31.3* 34.6*  MCV 96.9  --  95.7  --   PLT 205  --  132*  145*  --    Cardiac Enzymes: No results for input(s): "CKTOTAL", "CKMB", "CKMBINDEX", "TROPONINI" in the last 168 hours. CBG: Recent Labs  Lab 12/14/22 0645  GLUCAP 115*   Iron Studies: No results for input(s): "IRON", "TIBC", "TRANSFERRIN", "FERRITIN" in the last 72 hours. Studies/Results: CT ANGIO GI BLEED  Result Date: 12/14/2022 CLINICAL DATA:  77 year old with rectal bleeding. EXAM: CTA ABDOMEN AND PELVIS WITHOUT AND WITH CONTRAST TECHNIQUE: Multidetector CT imaging of the abdomen and pelvis was performed using the standard protocol during bolus administration of intravenous contrast. Multiplanar reconstructed images and MIPs were obtained and reviewed to evaluate the vascular anatomy. RADIATION DOSE REDUCTION: This exam was performed according to the departmental dose-optimization program which includes automated exposure control, adjustment of the mA and/or kV according to patient size and/or use of iterative reconstruction technique. CONTRAST:  OMNIPAQUE IOHEXOL 350 MG/ML SOLN COMPARISON:  09/10/2022 FINDINGS: VASCULAR Aorta: Atherosclerotic disease in the abdominal aorta without aneurysm, dissection or significant stenosis. Celiac: Patent without evidence of aneurysm, dissection,  vasculitis or significant stenosis. SMA: Patent without evidence of aneurysm, dissection, vasculitis or significant stenosis. Renals: Atherosclerotic disease involving bilateral renal arteries. There is at least mild stenosis involving the right renal artery but limited evaluation. No evidence for aneurysm or dissections involving the renal arteries. IMA: Patent Inflow: Atherosclerotic disease involving the bilateral iliac arteries without aneurysm, dissection or significant stenosis. Proximal Outflow: Proximal femoral arteries are patent bilaterally. Veins: Contrast in the IVC, right hepatic vein and renal veins related to the injection being performed through the right femoral central line. There  is a right common femoral central line with the tip in the right external iliac vein. There also appears to be contrast in the right internal iliac veins. Review of the MIP images confirms the above findings. NON-VASCULAR Lower chest: Trace right pleural fluid and small left pleural effusion. Compressive atelectasis at both lung bases. Coronary arteries are heavily calcified. Compressive atelectasis at both lung bases. Hepatobiliary: Cholecystostomy tube is positioned in the gallbladder. Gallbladder is decompressed. Mild intrahepatic biliary dilatation. No significant extrahepatic biliary dilatation. Pancreas: Unremarkable. No pancreatic ductal dilatation or surrounding inflammatory changes. Spleen: Normal in size without focal abnormality. Adrenals/Urinary Tract: Adrenal glands are within normal limits. Chronic atrophy involving both kidneys with mild perinephric edema. 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 No hydronephrosis. Urinary bladder is decompressed. Stomach/Bowel: Probable small diverticulum along the posterior aspect of the gastric fundus. No evidence for active GI bleeding. Normal appendix. Small metallic clip in the  colon near the hepatic flexure on image 49/11. Moderate amount of stool in the colon. Mild distention of the rectum containing high-density material which could be related to blood products. Perirectal and presacral edema. No evidence for acute bowel inflammation or obstruction. Lymphatic: No lymph node enlargement in the abdomen or pelvis. Reproductive: Prostate is enlarged with fiducial markers around and involving the prostate. Other: Mild subcutaneous edema. Presacral and perirectal edema without free fluid. Left inguinal hernia containing fat. Negative for free air. Musculoskeletal: Diffuse sclerosis in the bones compatible with end-stage renal disease. Bilateral pars defects at L5 without significant anterolisthesis of L5 on S1. Irregularity of the endplates at L3-L4 is similar to the recent comparison examination. Findings could represent extensive degenerative changes with Schmorl's nodes. Multilevel degenerative facet disease most prominent at L3-L4. IMPRESSION: 1. Mild distention of the rectum containing high-density material which could be related to blood products. No evidence for active GI bleeding. 2. Cholecystostomy tube is positioned in the gallbladder. Gallbladder is decompressed. 3. Small left pleural effusion with compressive atelectasis at both lung bases. 4. Mild subcutaneous edema with presacral and perirectal edema which could be related to fluid overload. 5. Diffuse sclerosis in the bones compatible with end-stage renal disease. 6. Aortic Atherosclerosis (ICD10-I70.0). Electronically Signed   By: Richarda Overlie M.D.   On: 12/14/2022 08:45    ROS: Review of systems not obtained due to patient factors. Physical Exam: Vitals:   12/14/22 0430 12/14/22 0530 12/14/22 0653 12/14/22 0706  BP:      Pulse:      Resp: 16 16    Temp:   (!) 97.1 F (36.2 C)   TempSrc:   Axillary   SpO2:      Weight:    87.8 kg  Height:          Weight change:   Intake/Output Summary (Last 24 hours) at  12/14/2022 1024 Last data filed at 12/14/2022 1610 Gross per 24 hour  Intake 1172.89 ml  Output 100 ml  Net 1072.89 ml   BP (!) 82/49   Pulse (!) 111   Temp (!) 97.1 F (36.2 C) (Axillary)   Resp 16   Ht 5\' 10"  (1.778 m)   Wt 87.8 kg   SpO2 100%   BMI 27.77 kg/m  General appearance: uncooperative and somnolent Head: Normocephalic, without obvious abnormality, atraumatic Resp: clear to auscultation bilaterally Cardio: tachycardic, no rub GI: soft, non-tender; bowel sounds normal; no masses,  no organomegaly Extremities: extremities normal, atraumatic, no cyanosis or  edema and LUE AVF +T/B Dialysis Access:  Dialysis Orders: [facility] HD: [facility] M[facility] 3h  450/600  99kg  2/2.5 bath  LUA AVF  Hep none - last HD 4/26, post wt 111kg (lowered during hospitalization last month to 96kg) - rocaltrol 0.75 mcg po tiw - venofer 100mg  tiw thru 5/08 - mircera 200 mcg IV q 2 wks, last 4/26 - last Hb 7.7 on 4/24  Assessment/Plan:  Hemorrhagic shock - pressors to keep MAP >65.  He has received 3 units PRBC's.  CT angio negative for active bleed.  Hold eliquis for now.  ESRD -  plan for HD today to keep on schedule.  Hypertension/volume  -  currently below his edw, however did receive large amount of blood products.  Will UF as tolerated.  Will plan for HD at bedside and use pressors prn for MAP >65.  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  S/p blood transfusions x 3.  Follow H/H and transfuse prn.  Metabolic bone disease -  continue with home meds  Nutrition -  renal diet, carb modified.  A fib/flutter - would not resume eliquis at this time as risks outweigh benefits.    Acute cholecystitis s/p percutaneous cholecystostomy tube - from OSH 4/2-4/23/24 and felt to be too high risk for surgical intervention.  Drain exchanged 12/01/22 and f/u with surgery at [facility] in July.  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  Currently DNR.   May benefit from ongoing discussions with palliative care for possible transition to comfort if he were to deteriorate further.   Irena Cords, MD [facility], [facility] 12/14/2022, 10:24 AM

## 2022-12-14 NOTE — Progress Notes (Signed)
Report taken from [place], RN from ED. Informed that "patient is too unstable to get CTA" prior to coming to 22m02 by ED nurse. Will continue to follow the care plan once patient arrives.

## 2022-12-14 NOTE — Plan of Care (Signed)
CTA reviewed. No active bleeding. Will consult nephrology as patient needs HD today ( Last treatment was Friday 12/11/2022, and he has received blood products). Question whether he needs to continue with Eliquis.

## 2022-12-14 NOTE — H&P (Signed)
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 Reportedly continued w EMS and has continued throughout ED stay. Progressively hypotensive - 3 PRBC, Kcentra, pressors   PCCM called for admission   Pertinent  Medical History  ESRD Afib Chronic AC (eliquis) GIBs Rectal AVM Chronic hypotension Moderate AS   Significant Hospital Events: Including procedures, antibiotic start and stop dates in addition to other pertinent events   6/2 to ED from [facility] with rectal bleeding. 6/3 progressive hem shock. 3 prbc, kcentra, CVC placed and on pressors. PCCM to admit   Interim History / Subjective:  CVC placed as well as arterial line  Objective   Blood pressure 91/67, pulse (!) 110, temperature (!) 97 F (36.1 C), resp. rate 15, height 5\' 10"  (1.778 m), weight 104.3 kg, SpO2 100 %.        Intake/Output Summary (Last 24 hours) at 12/14/2022 0256 Last data filed at 12/14/2022 0155 Gross per 24 hour  Intake 258.84 ml  Output --  Net 258.84 ml   Filed Weights   12/13/22 2316  Weight: 104.3 kg    Examination: General: nad, drowsy post medication arousable HENT: NCAT perrla, eomi, mm dry but pink Lungs: ctab Cardiovascular: irreg irreg Abdomen: soft nt nd BS+ choley tube in place Extremities: no c/c/e bilateral boots on feet Neuro: arousable, spontaneously moves GU: deferred  Resolved Hospital Problem list     Assessment & Plan:   Acute GIB , hx lower GIBs   Hemorrhagic shock ABLA Coagulopathy  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 Chronic hypotension ESRD DNR status  -transfused 3 PRBC and given Kcentra in ED P -CTA GI -- IR and GI  consults pending scan findings  -BID PPI -serial H/H coags, transfuse prn -- will order 1 ffp while we await repeat coags -Diligent pressor titration while we transfuse, dont want to greatly overshoot our BP goal w ongoing bleed  -will give 1g cal glu  -Admit to ICU -AM nephro consult   Best Practice (right click and "Reselect all SmartList Selections" daily)   Diet/type: NPO DVT prophylaxis: SCD GI prophylaxis: PPI Lines: Central line Foley:  N/A Code Status:  DNR Last date of multidisciplinary goals of care discussion [--]  Labs   CBC: Recent Labs  Lab 12/13/22 2340 12/13/22 2353  WBC 10.2  --   HGB 8.4* 13.9  HCT 28.3* 41.0  MCV 96.9  --   PLT 205  --     Basic Metabolic Panel: Recent Labs  Lab 12/13/22 2340 12/13/22 2353  NA 137 140  K 3.5 4.4  CL 98 97*  CO2 30  --   GLUCOSE 103* 101*  BUN 23 36*  CREATININE 6.76* 7.00*  CALCIUM 8.2*  --    GFR: Estimated Creatinine Clearance: 10.5 mL/min (A) (by C-G formula based on SCr of 7 mg/dL (H)). Recent Labs  Lab 12/13/22 2340  WBC 10.2    Liver Function Tests: Recent Labs  Lab 12/13/22 2340  AST 30  ALT 15  ALKPHOS 62  BILITOT 0.8  PROT 6.0*  ALBUMIN 1.9*   No results for input(s): "LIPASE", "AMYLASE" in the last  168 hours. No results for input(s): "AMMONIA" in the last 168 hours.  ABG    Component Value Date/Time   PHART 7.301 (L) 11/09/2022 0259   PCO2ART 57.6 (H) 11/09/2022 0259   PO2ART 109 (H) 11/09/2022 0259   HCO3 28.4 (H) 11/09/2022 0259   TCO2 35 (H) 12/13/2022 2353   O2SAT 98 11/09/2022 0259     Coagulation Profile: Recent Labs  Lab 12/13/22 2340  INR 2.6*    Cardiac Enzymes: No results for input(s): "CKTOTAL", "CKMB", "CKMBINDEX", "TROPONINI" in the last 168 hours.  HbA1C: Hgb A1c MFr Bld  Date/Time Value Ref Range Status  06/16/2022 03:55 AM 6.5 (H) 4.8 - 5.6 % Final    Comment:    (NOTE)         Prediabetes: 5.7 - 6.4         Diabetes: >6.4         Glycemic  control for adults with diabetes: <7.0   01/28/2020 09:51 AM 6.1 (H) 4.8 - 5.6 % Final    Comment:    (NOTE) Pre diabetes:          5.7%-6.4%  Diabetes:              >6.4%  Glycemic control for   <7.0% adults with diabetes     CBG: No results for input(s): "GLUCAP" in the last 168 hours.  Review of Systems:   Denies pain, felt as though he was going to have a bowel movement but it was frank blood. 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   Surgical History:   Past Surgical History:  Procedure Laterality Date   A/V FISTULAGRAM Left 08/17/2017   Procedure: A/V FISTULAGRAM;  Surgeon: Renford Dills, MD;  Location: [facility] INVASIVE CV LAB;  Service: Cardiovascular;  Laterality: Left;   A/V SHUNT INTERVENTION N/A 08/17/2017   Procedure: A/V SHUNT INTERVENTION;  Surgeon: Renford Dills, MD;  Location: [facility] INVASIVE CV LAB;  Service: Cardiovascular;  Laterality: N/A;   AV FISTULA PLACEMENT Left 05/31/2015   Procedure: ARTERIOVENOUS (AV) FISTULA CREATION;  Surgeon: Renford Dills, MD;  Location: [facility] ORS;  Service: Vascular;  Laterality: Left;   COLONOSCOPY WITH PROPOFOL N/A 09/11/2022   Procedure: COLONOSCOPY WITH PROPOFOL;  Surgeon: Imogene Burn, MD;  Location: [facility] ENDOSCOPY;  Service: Gastroenterology;   Laterality: N/A;   COLONOSCOPY WITH PROPOFOL N/A 09/17/2022   Procedure: COLONOSCOPY WITH PROPOFOL;  Surgeon: Benancio Deeds, MD;  Location: [facility] ENDOSCOPY;  Service: Gastroenterology;  Laterality: N/A;   ESOPHAGOGASTRODUODENOSCOPY (EGD) WITH PROPOFOL N/A 09/11/2022   Procedure: ESOPHAGOGASTRODUODENOSCOPY (EGD) WITH PROPOFOL;  Surgeon: Imogene Burn, MD;  Location: [facility] ENDOSCOPY;  Service: Gastroenterology;  Laterality: N/A;   excision bone spurs Bilateral 1989   feet   GIVENS CAPSULE STUDY N/A 11/10/2022   Procedure: GIVENS CAPSULE STUDY;  Surgeon: Shellia Cleverly, DO;  Location: [facility] ENDOSCOPY;  Service: Gastroenterology;  Laterality: N/A;   HEMOSTASIS CLIP PLACEMENT  09/11/2022   Procedure: HEMOSTASIS CLIP PLACEMENT;  Surgeon: Imogene Burn, MD;  Location: [facility] ENDOSCOPY;  Service: Gastroenterology;;   HEMOSTASIS CLIP PLACEMENT  09/17/2022   Procedure: HEMOSTASIS CLIP PLACEMENT;  Surgeon: Benancio Deeds, MD;  Location: [facility] ENDOSCOPY;  Service: Gastroenterology;;   HOT HEMOSTASIS N/A 09/11/2022   Procedure: HOT HEMOSTASIS (ARGON PLASMA COAGULATION/BICAP);  Surgeon: Imogene Burn, MD;  Location: [facility] ENDOSCOPY;  Service: Gastroenterology;  Laterality: N/A;   HOT HEMOSTASIS N/A 09/17/2022   Procedure: HOT HEMOSTASIS (ARGON PLASMA COAGULATION/BICAP);  Surgeon: Benancio Deeds, MD;  Location: [facility] ENDOSCOPY;  Service: Gastroenterology;  Laterality: N/A;   INCISION AND DRAINAGE PERIRECTAL ABSCESS N/A 06/15/2022   Procedure: IRRIGATION AND DEBRIDEMENT PERIRECTAL ABSCESS;  Surgeon: Franky Macho, MD;  Location: [facility] ORS;  Service: General;  Laterality: N/A;   KNEE SURGERY Left 1998   arthroscopy   POLYPECTOMY  09/11/2022   Procedure: POLYPECTOMY;  Surgeon: Imogene Burn, MD;  Location: [facility] ENDOSCOPY;  Service: Gastroenterology;;   SHOULDER SURGERY Left 1994   rotator cuff     Social History:   reports that he quit smoking about 44 years ago. His smoking use included cigarettes. He has never used  smokeless tobacco. He reports that he does not drink alcohol and does not use drugs.   Family History:  His family history includes Alcoholism in his brother and father; Kidney disease in his mother.   Allergies No Known Allergies   Home Medications  Prior to Admission medications   Medication Sig Start Date End Date Taking? Authorizing Provider  acetaminophen (TYLENOL) 325 MG tablet Take 2 tablets (650 mg total) by mouth every 6 (six) hours as needed for mild pain (or Fever >/= 101). 11/24/22   Pokhrel, Rebekah Chesterfield, MD  atorvastatin (LIPITOR) 40 MG tablet Take 40 mg by mouth daily.  Patient not taking: Reported on 11/09/2022 10/30/14   [provider]  b complex-vitamin c-folic acid (NEPHRO-VITE) 0.8 MG TABS tablet Take 1 tablet by mouth at bedtime.    [provider]  calcitRIOL (ROCALTROL) 0.25 MCG capsule Take 3 capsules (0.75 mcg total) by mouth every Monday, Wednesday, and Friday with hemodialysis. 11/25/22   Pokhrel, Rebekah Chesterfield, MD  cinacalcet (SENSIPAR) 30 MG tablet Take 30 mg by mouth daily. Patient not taking: Reported on 11/09/2022 08/25/22   [provider]  ELIQUIS 5 MG TABS tablet Take 1 tablet (5 mg total) by mouth 2 (two) times daily. Resume Your Eliquis after 5 days 09/23/22   Lanae Boast, MD  midodrine (PROAMATINE) 5 MG tablet Take 3 tablets (15 mg total) by mouth in the morning, at noon, and at bedtime. Prior to dialysis M-W-F 11/24/22   Pokhrel, Rebekah Chesterfield, MD  Olopatadine HCl (PATADAY) 0.2 % SOLN Place 1 drop into both eyes daily.    [provider]  pantoprazole (PROTONIX) 40 MG tablet Take 1 tablet (40 mg total) by mouth 2 (two) times daily before a meal. 11/24/22   Pokhrel, Rebekah Chesterfield, MD  pregabalin (LYRICA) 75 MG capsule Take 1 capsule (75 mg total) by mouth at bedtime. 11/24/22 11/24/23  Pokhrel, Rebekah Chesterfield, MD  traZODone (DESYREL) 50 MG tablet Take 1 tablet (50 mg total) by mouth at bedtime as needed for sleep. 11/24/22   Pokhrel, Rebekah Chesterfield, MD  VELPHORO 500 MG  chewable tablet Chew 500 mg by mouth 3 (three) times daily with meals. 06/03/22   [provider]     Critical care time: 

## 2022-12-14 NOTE — H&P (Signed)
NAME:  Justin [name], MRN:  [id number], DOB:  06-21-1946, LOS: 0 ADMISSION DATE:  12/13/2022, CONSULTATION DATE:  12/14/22 REFERRING MD:  Blinda Leatherwood , CHIEF COMPLAINT:  GIB   History of Present Illness:  77 yo male with hx of A fib on eliquis and rectal AVM with recurrent GI bleeding presented from NH with rectal bleeding.  Developed hypotension.  Received PRBC and Kcentra.  Started on pressors.  PCCM asked to admit to ICU.  Pertinent  Medical History  ESRD, A fib, Rectal AVM, Chronic hypotension, Moderate aortic stenosis, Prostate cancer, DM type 2, DM retinopathy and peripheral neuropathy, Gout, HTN  Significant Hospital Events: Including procedures, antibiotic start and stop dates in addition to other pertinent events   3/07 Colonoscopy >> cecal polyp not removed, solitary ulcer in proximal transverse colon from prior polypectomy 3 clips placed, solitary ulcer mid transverse colon 2 clips placed, few polyps in transverse colon, diverticulosis in transverse colon and Lt colon, blood in rectum and recto-sigmoid colon, one 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 for acute cholecystitis with septic shock and NSTEMI 4/28 to 5/14 Admit for lower GI bleed, PNA 6/02 to ED from [facility] with rectal bleeding. 6/03 progressive hem shock. 3 prbc, kcentra, CVC placed and on pressors. PCCM to admit; CT angio GI bleed negative for active bleeding  Interim History / Subjective:  Returned from radiology.  Passed some blood clots from rectum but no further active bleeding.  Objective   Blood pressure (!) 82/49, pulse (!) 111, temperature (!) 97.1 F (36.2 C), temperature source Axillary, resp. rate 16, height 5\' 10"  (1.778 m), weight 87.8 kg, SpO2 100 %.        Intake/Output Summary (Last 24 hours) at 12/14/2022 0915 Last data filed at 12/14/2022 4098 Gross per 24 hour  Intake 1172.89 ml  Output  100 ml  Net 1072.89 ml   Filed Weights   12/13/22 2316 12/14/22 0706  Weight: 104.3 kg 87.8 kg    Examination:  General - somnolent Eyes - pupils reactive ENT - dry mucosa Cardiac - irregular, 2/6 SM Chest - equal breath sounds b/l, no wheezing or rales Abdomen - soft, non tender, + bowel sounds, GB drain in RUQ Extremities - 1+ edema, Lt arm AV fistula Skin - no rashes Neuro - follows commands when awake  Resolved Hospital Problem list     Assessment & Plan:   Acute lower GI bleeding with hx of radiation proctitis, distal rectum angiodysplastic lesion, and rectal AVM. - CT angio abd/pelvis from 6/03 negative for active bleeding - GI consulted by ER  Hemorrhagic shock. - pressors to keep MAP > 65  Acute blood loss anemia 2nd to GI bleeding. - f/u CBC - transfuse for Hb < 7  A fib/flutter on eliquis. Aortic stenosis. Chronic hypotension on midodrine with HD. - Kcentra on 6/03 - monitor on telemetry - ?if eliquis showed be resumed  ESRD. Hypokalemia. - will need to consult nephrology for HD needs  Acute cholecystitis s/p percutaneous cholecystostomy tube. - was at [facility] from 10/13/22 to 11/03/22 >> felt to be too high risk for surgical intervention - drain put in on 10/16/22 and exchanged on 12/01/22 - plan is to have follow up with surgery at [facility] in July and then reassess plan  DM type 2. - SSI  Best Practice (right click and "Reselect all SmartList Selections" daily)   Diet/type: NPO DVT  prophylaxis: SCD GI prophylaxis: PPI Lines: Central line Foley:  N/A Code Status:  DNR Last date of multidisciplinary goals of care discussion [daughter updated at bedside]  Labs       Latest Ref Rng & Units 12/14/2022    3:16 AM 12/13/2022   11:53 PM 12/13/2022   11:40 PM  CMP  Glucose 70 - 99 mg/dL 308  657  846   BUN 8 - 23 mg/dL 22  36  23   Creatinine 0.61 - 1.24 mg/dL 9.62  9.52  8.41   Sodium 135 - 145 mmol/L 137  140  137   Potassium 3.5 - 5.1 mmol/L 3.2   4.4  3.5   Chloride 98 - 111 mmol/L 99  97  98   CO2 22 - 32 mmol/L 27   30   Calcium 8.9 - 10.3 mg/dL 7.8   8.2   Total Protein 6.5 - 8.1 g/dL   6.0   Total Bilirubin 0.3 - 1.2 mg/dL   0.8   Alkaline Phos 38 - 126 U/L   62   AST 15 - 41 U/L   30   ALT 0 - 44 U/L   15        Latest Ref Rng & Units 12/14/2022    6:02 AM 12/14/2022    3:16 AM 12/13/2022   11:53 PM  CBC  WBC 4.0 - 10.5 K/uL  10.3    Hemoglobin 13.0 - 17.0 g/dL 32.4  40.1  02.7   Hematocrit 39.0 - 52.0 % 34.6  31.3  41.0   Platelets 150 - 400 K/uL 150 - 400 K/uL  132    145      CBG (last 3)  Recent Labs    12/14/22 0645  GLUCAP 115*   Critical care time: 48 minutes  Coralyn Helling, MD [place] [facility]/Critical Care Pager - [phone] or [phone] 12/14/2022, 9:16 AM

## 2022-12-14 NOTE — Progress Notes (Signed)
eLink Physician-Brief Progress Note Patient Name: Justin [name] DOB: Jun 07, 1946 MRN: [id number]   Date of Service  12/14/2022  HPI/Events of Note  77 year old with a history of atrial fibrillation on apixaban and previous GI bleed with rectal AVM, ESRD and moderate AS who was transferred from an outside facility with new onset rectal bleeding.  Received 3 units PRBC, Kcentra and was started on pressors in the setting of hypotension.  On presentation, he was hypotensive and tachycardic, norepinephrine was initiated, and placed on oxygen 3 L.  Metabolic panel consistent with elevated creatinine, hyperkalemia, and initial hemoglobin 8.4 now up to 10.  Mild thrombocytopenia.  INR elevated.  eICU Interventions  Continue Protonix IV twice daily  Maintain norepinephrine as needed to keep MAP greater than 65  GI consultation pending  CT angiography pending -last EGD a month ago with potential gastric AVM and previous proctitis status post radiation.  CBC every 6 hours as needed  Will likely need dialysis Monday.  GI prophylaxis/treatment with pantoprazole IV twice daily  Chemical DVT prophylaxis held in the setting of bleeding     Intervention Category Evaluation Type: New Patient Evaluation  [name] 12/14/2022, 4:00 AM

## 2022-12-14 NOTE — Plan of Care (Signed)
  Chart reviewed to follow up on previously ordered STAT imaging   Not clear to me why pt has not had STAT CTA GI -- arrives to ICU with MAP > 65 not on pressors, after report was given to ICU RN from EM that pt was too unstable for scan.   This has been ordered STAT for several hours and needs to be completed -- appreciate ICU nursing team's diligence in trying to coordinate this as soon as possible  Have also d/w oncoming PCCM provider    Tessie Fass MSN, AGACNP-BC [facility] Pulmonary/Critical Care Medicine 12/14/2022, 6:54 AM

## 2022-12-14 NOTE — ED Notes (Signed)
Verbal order from EDP Pollina to run RBC's at 324ml/hr.

## 2022-12-14 NOTE — Progress Notes (Signed)
Pt receives out-pt HD at [facility] on MWF. From chart review, appears pt admitted from snf. Will assist as needed.   Olivia Canter Renal Navigator [phone]

## 2022-12-14 NOTE — Progress Notes (Signed)
HEMODIALYSIS TREATMENT NOTE:  Pre-HD A/O x3, 132/64, MAP 87, spO2 95% on RA.  Off pressors. HD was initiated with a 2L goal. LUE AVF was cannulated without difficulty.  Had large bloody stool 25m into treatment.  Pre-HD K resulted at 3.6.  3K bath utilized for treatment. Pt maintained SBPs>110 and MAP>65 for most of treatment.  BP dropped to 82/63 with 40 minutes remaining. UF was paused, Albumin 25g was given.  UF was resumed 10 minutes later at a reduced rate / lower goal.  3.5 hour heparin-free treatment completed. All blood was returned and hemostasis was achieved in 20 minutes.  Post-treatment:  12/14/22 2250  Vitals  Temp 97.9 F (36.6 C)  Temp Source Axillary  BP 105/64  MAP (mmHg) 79  BP Location Other (Comment) (art line)  BP Method Automatic  Patient Position (if appropriate) Lying  Pulse Rate (!) 114  Pulse Rate Source Monitor  ECG Heart Rate (!) 113  Resp 18  Oxygen Therapy  SpO2 96 %  O2 Device Room Air  Post Treatment  Dialyzer Clearance Lightly streaked  Duration of HD Treatment -hour(s) 3.5 hour(s)  Hemodialysis Intake (mL) 0 mL  Liters Processed 67.4  Fluid Removed (mL) 1600 mL  Tolerated HD Treatment No (Comment)  Post-Hemodialysis Comments UF limited by hypotension. Net UF 1.6 L  AVG/AVF Arterial Site Held (minutes) 10 minutes  AVG/AVF Venous Site Held (minutes) 10 minutes  Fistula / Graft Left Upper arm Arteriovenous fistula  No Placement Date or Time found.   Placed prior to admission: Yes  Orientation: Left  Access Location: Upper arm  Access Type: Arteriovenous fistula  Fistula / Graft Assessment Thrill;Bruit  Status Patent    Arman Filter, RN [facility] @ [facility]

## 2022-12-14 NOTE — ED Provider Notes (Signed)
Patient presents to the emergency department for GI bleeding.  Patient passing large amounts of dark red blood per rectum.  Physical Exam  BP (!) 53/33   Pulse (!) 111   Temp (!) 97 F (36.1 C)   Resp 16   Ht 5\' 10"  (1.778 m)   Wt 104.3 kg   SpO2 100%   BMI 33.00 kg/m   Physical Exam  Procedures  .Central Line  Date/Time: 12/14/2022 4:14 AM  Performed by: Gilda Crease, MD Authorized by: Gilda Crease, MD   Consent:    Consent obtained:  Verbal   Consent given by:  Patient and healthcare agent   Risks, benefits, and alternatives were discussed: yes     Risks discussed:  Arterial puncture, incorrect placement, nerve damage, bleeding and infection Universal protocol:    Procedure explained and questions answered to patient or proxy's satisfaction: yes     Required blood products, implants, devices, and special equipment available: yes     Site/side marked: yes     Immediately prior to procedure, a time out was called: yes     Patient identity confirmed:  Verbally with patient Pre-procedure details:    Indication(s): central venous access and insufficient peripheral access     Hand hygiene: Hand hygiene performed prior to insertion     Sterile barrier technique: All elements of maximal sterile technique followed     Skin preparation:  Chlorhexidine   Skin preparation agent: Skin preparation agent completely dried prior to procedure   Sedation:    Sedation type:  None Anesthesia:    Anesthesia method:  Local infiltration   Local anesthetic:  Lidocaine 1% w/o epi Procedure details:    Location:  R femoral   Site selection rationale:  Patient with anticoagulation, shows most compressible vessel   Patient position:  Supine   Procedural supplies:  Triple lumen   Catheter size:  7 Fr   Landmarks identified: yes     Ultrasound guidance: yes     Ultrasound guidance timing: real time     Sterile ultrasound techniques: Sterile gel and sterile probe covers were  used     Number of attempts:  1   Successful placement: yes   Post-procedure details:    Post-procedure:  Dressing applied and line sutured   Assessment:  Blood return through all ports and free fluid flow   Procedure completion:  Tolerated well, no immediate complications   ED Course / MDM    Medical Decision Making Amount and/or Complexity of Data Reviewed External Data Reviewed: labs, ECG and notes. Labs: ordered. Decision-making details documented in ED Course. Radiology: ordered and independent interpretation performed. Decision-making details documented in ED Course. ECG/medicine tests: ordered and independent interpretation performed. Decision-making details documented in ED Course.  Risk OTC drugs. Prescription drug management. Decision regarding hospitalization.   Presented with acute GI bleeding.  Patient passing a large amount of dark red blood per rectum.  Patient with known history of AVMs and recurrent bleed.  Unfortunately, patient still on Eliquis secondary to atrial fibrillation.  Patient with life-threatening bleed, reversal agents initiated with pharmacy.  Patient became quite hypotensive.  He seems to be very volume dependent.  Blood pressure does improve with fluids and packed red blood cell administration.  He does, however, become quite hypotensive when not receiving volume.  Patient confirmed to be DNI.  This complicates matters as he is a dialysis patient.  At this time he has not shown any signs of volume overload,  continue transfusions.  May need dialysis with transfusions if he does become volume overloaded.  Patient will be admitted to critical care service in the ICU.       Gilda Crease, MD 12/14/22 [phone]

## 2022-12-14 NOTE — Progress Notes (Signed)
Arterial Catheter Insertion Procedure Note  BALEN PINAULT  [id number]  [birth year]  Date:12/14/22  Time:3:05 AM    Provider Performing: Nelda Marseille    Procedure: Insertion of Arterial Line ([id number]) without US guidance  Indication(s) Blood pressure monitoring and/or need for frequent ABGs  Consent Unable to obtain consent due to emergent nature of procedure.  Anesthesia None   Time Out Verified patient identification, verified procedure, site/side was marked, verified correct patient position, special equipment/implants available, medications/allergies/relevant history reviewed, required imaging and test results available.   Sterile Technique Maximal sterile technique including full sterile barrier drape, hand hygiene, sterile gown, sterile gloves, mask, hair covering, sterile ultrasound probe cover (if used).   Procedure Description Area of catheter insertion was cleaned with chlorhexidine and draped in sterile fashion. With real-time ultrasound guidance an arterial catheter was placed into the right radial artery.  Appropriate arterial tracings confirmed on monitor.     Complications/Tolerance None; patient tolerated the procedure well.   EBL Minimal   Specimen(s) None

## 2022-12-14 NOTE — ED Notes (Signed)
ED TO INPATIENT HANDOFF REPORT  ED Nurse Name and Phone #:  Gillis Ends #[phone]  S Name/Age/Gender Justin [name] 77 y.o. male Room/Bed: 028C/028C  Code Status   Code Status: DNR  Home/SNF/Other Skilled nursing facility Patient oriented to: self, place, time, and situation Is this baseline? Yes   Triage Complete: Triage complete  Chief Complaint Hemorrhagic shock [facility]) [R57.8]  Triage Note Pt arrives from [facility]. Hx of prostate/colon ca, dialysis pt. Started having rectal bleeding at 2130. Had large blood clot and bleeding for PTAR during transport. On eliquis. Denies pain. 110/70, repeat 94/62, 100% 3 liters, HR 113, RR 18, cbg 128. Unable to establish IV. A/O x 4. Last stool prior to today was yesterday.    Allergies No Known Allergies  Level of Care/Admitting Diagnosis ED Disposition     ED Disposition  Admit   Condition  --   Comment  Hospital Area: [facility] [[id number]]  Level of Care: ICU [6]  May admit patient to [facility] or [facility] if equivalent level of care is available:: Yes  Covid Evaluation: Asymptomatic - no recent exposure (last 10 days) testing not required  Diagnosis: Hemorrhagic shock [facility]) [[id number]]  Admitting Physician: Briant Sites [[id number]]  Attending Physician: Briant Sites [phone]  Certification:: I certify this patient will need inpatient services for at least 2 midnights  Estimated Length of Stay: 5          B Medical/Surgery History 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) 12/07/2019    Dialysis patient (HCC)    Gout    Hypercalcemia 11/14/2021   Hyperkalemia 10/19/2018   Hypertension    Hypotension 06/13/2022   Right posterior capsular opacification 02/27/2021   Septic shock (HCC)    Vitreous hemorrhage of right eye (HCC) 10/24/2019   Past Surgical History:  Procedure Laterality Date   A/V FISTULAGRAM Left 08/17/2017   Procedure: A/V FISTULAGRAM;  Surgeon: Renford Dills, MD;  Location: [facility] INVASIVE CV LAB;  Service: Cardiovascular;  Laterality: Left;   A/V SHUNT INTERVENTION N/A 08/17/2017   Procedure: A/V SHUNT INTERVENTION;  Surgeon: Renford Dills, MD;  Location: [facility] INVASIVE CV LAB;  Service: Cardiovascular;  Laterality: N/A;   AV FISTULA PLACEMENT Left 05/31/2015   Procedure: ARTERIOVENOUS (AV) FISTULA CREATION;  Surgeon: Renford Dills, MD;  Location: [facility] ORS;  Service: Vascular;  Laterality: Left;   COLONOSCOPY WITH PROPOFOL N/A 09/11/2022   Procedure: COLONOSCOPY WITH PROPOFOL;  Surgeon: Imogene Burn, MD;  Location: [facility] ENDOSCOPY;  Service: Gastroenterology;  Laterality: N/A;   COLONOSCOPY WITH PROPOFOL N/A 09/17/2022   Procedure: COLONOSCOPY WITH PROPOFOL;  Surgeon: Benancio Deeds, MD;  Location: [facility] ENDOSCOPY;  Service: Gastroenterology;  Laterality: N/A;   ESOPHAGOGASTRODUODENOSCOPY (EGD) WITH PROPOFOL N/A 09/11/2022   Procedure: ESOPHAGOGASTRODUODENOSCOPY (EGD) WITH PROPOFOL;  Surgeon: Imogene Burn, MD;  Location: [facility] ENDOSCOPY;  Service: Gastroenterology;  Laterality: N/A;   excision bone spurs Bilateral 1989   feet   GIVENS CAPSULE STUDY N/A 11/10/2022   Procedure: GIVENS CAPSULE STUDY;  Surgeon: Shellia Cleverly, DO;  Location:  [facility] ENDOSCOPY;  Service: Gastroenterology;  Laterality: N/A;   HEMOSTASIS CLIP PLACEMENT  09/11/2022   Procedure: HEMOSTASIS CLIP PLACEMENT;  Surgeon: Imogene Burn, MD;  Location: [facility] ENDOSCOPY;  Service: Gastroenterology;;   HEMOSTASIS CLIP PLACEMENT  09/17/2022   Procedure: HEMOSTASIS CLIP PLACEMENT;  Surgeon:  Benancio Deeds, MD;  Location: [facility] ENDOSCOPY;  Service: Gastroenterology;;   HOT HEMOSTASIS N/A 09/11/2022   Procedure: HOT HEMOSTASIS (ARGON PLASMA COAGULATION/BICAP);  Surgeon: Imogene Burn, MD;  Location: [facility] ENDOSCOPY;  Service: Gastroenterology;  Laterality: N/A;   HOT HEMOSTASIS N/A 09/17/2022   Procedure: HOT HEMOSTASIS (ARGON PLASMA COAGULATION/BICAP);  Surgeon: Benancio Deeds, MD;  Location: [facility] ENDOSCOPY;  Service: Gastroenterology;  Laterality: N/A;   INCISION AND DRAINAGE PERIRECTAL ABSCESS N/A 06/15/2022   Procedure: IRRIGATION AND DEBRIDEMENT PERIRECTAL ABSCESS;  Surgeon: Franky Macho, MD;  Location: [facility] ORS;  Service: General;  Laterality: N/A;   KNEE SURGERY Left 1998   arthroscopy   POLYPECTOMY  09/11/2022   Procedure: POLYPECTOMY;  Surgeon: Imogene Burn, MD;  Location: [facility] ENDOSCOPY;  Service: Gastroenterology;;   SHOULDER SURGERY Left 1994   rotator cuff     A IV Location/Drains/Wounds Patient Lines/Drains/Airways Status     Active Line/Drains/Airways     Name Placement date Placement time Site Days   Arterial Line 12/14/22 Right Radial 12/14/22  0250  Radial  less than 1   Peripheral IV 12/13/22 20 G Right Antecubital 12/13/22  2348  Antecubital  1   CVC Triple Lumen 12/14/22 Right Femoral 12/14/22  0300  -- less than 1   Fistula / Graft Left Upper arm Arteriovenous fistula --  --  Upper arm  --   Closed System Drain Lateral RLQ Other (Comment) --  --  RLQ  --   Pressure Injury 11/09/22 Buttocks Left;Right;Lower Stage 2 -  Partial thickness loss of dermis presenting as a shallow open injury with a red, pink wound bed without slough. 1x2 pale colored injury 11/09/22  0525  -- 35   Wound / Incision (Open or Dehisced) 01/29/20 Non-pressure wound Back Lower abscess 01/29/20  2303  Back  1050   Wound / Incision (Open or Dehisced) 06/13/22 Non-pressure wound Buttocks Left abscess to inside of buttock 06/13/22  0228  Buttocks  184   Wound / Incision (Open or Dehisced)  11/15/22 Other (Comment) Hip Left pink, bleeding 11/15/22  --  Hip  29            Intake/Output Last 24 hours  Intake/Output Summary (Last 24 hours) at 12/14/2022 0505 Last data filed at 12/14/2022 0503 Gross per 24 hour  Intake 696.89 ml  Output --  Net 696.89 ml    Labs/Imaging Results for orders placed or performed during the hospital encounter of 12/13/22 (from the past 48 hour(s))  Comprehensive metabolic panel     Status: Abnormal   Collection Time: 12/13/22 11:40 PM  Result Value Ref Range   Sodium 137 135 - 145 mmol/L   Potassium 3.5 3.5 - 5.1 mmol/L   Chloride 98 98 - 111 mmol/L   CO2 30 22 - 32 mmol/L   Glucose, Bld 103 (H) 70 - 99 mg/dL    Comment: Glucose reference range applies only to samples taken after fasting for at least 8 hours.   BUN 23 8 - 23 mg/dL   Creatinine, Ser 9.81 (H) 0.61 - 1.24 mg/dL   Calcium 8.2 (L) 8.9 - 10.3 mg/dL   Total Protein 6.0 (L) 6.5 - 8.1 g/dL  Albumin 1.9 (L) 3.5 - 5.0 g/dL   AST 30 15 - 41 U/L   ALT 15 0 - 44 U/L   Alkaline Phosphatase 62 38 - 126 U/L   Total Bilirubin 0.8 0.3 - 1.2 mg/dL   GFR, Estimated 8 (L) >60 mL/min    Comment: (NOTE) Calculated using the CKD-EPI Creatinine Equation (2021)    Anion gap 9 5 - 15    Comment: Performed at [facility], [address] [place], [place] [address]  CBC     Status: Abnormal   Collection Time: 12/13/22 11:40 PM  Result Value Ref Range   WBC 10.2 4.0 - 10.5 K/uL   RBC 2.92 (L) 4.22 - 5.81 MIL/uL   Hemoglobin 8.4 (L) 13.0 - 17.0 g/dL   HCT 09.8 (L) 11.9 - 14.7 %   MCV 96.9 80.0 - 100.0 fL   MCH 28.8 26.0 - 34.0 pg   MCHC 29.7 (L) 30.0 - 36.0 g/dL   RDW 82.9 (H) 56.2 - 13.0 %   Platelets 205 150 - 400 K/uL   nRBC 0.0 0.0 - 0.2 %    Comment: Performed at [facility], [address] [place], [place] [address]  Type and screen [facility]     Status: None (Preliminary result)   Collection Time: 12/13/22 11:40 PM  Result Value Ref Range    ABO/RH(D) O POS    Antibody Screen NEG    Sample Expiration 12/16/2022,2359    Unit Number [id number]    Blood Component Type RED CELLS,LR    Unit division 00    Status of Unit ISSUED    Unit tag comment VERBAL ORDERS PER DR POLLINA    Transfusion Status OK TO TRANSFUSE    Crossmatch Result COMPATIBLE    Unit Number [id number]    Blood Component Type RED CELLS,LR    Unit division 00    Status of Unit ISSUED    Transfusion Status OK TO TRANSFUSE    Crossmatch Result COMPATIBLE    Unit Number [id number]    Blood Component Type RED CELLS,LR    Unit division 00    Status of Unit ISSUED    Transfusion Status OK TO TRANSFUSE    Crossmatch Result COMPATIBLE    Unit Number [id number]    Blood Component Type RED CELLS,LR    Unit division 00    Status of Unit ISSUED    Transfusion Status OK TO TRANSFUSE    Crossmatch Result COMPATIBLE    Unit Number [id number]    Blood Component Type RED CELLS,LR    Unit division 00    Status of Unit ALLOCATED    Transfusion Status OK TO TRANSFUSE    Crossmatch Result COMPATIBLE    Unit Number [id number]    Blood Component Type RED CELLS,LR    Unit division 00    Status of Unit ALLOCATED    Transfusion Status OK TO TRANSFUSE    Crossmatch Result COMPATIBLE    Unit Number [id number]    Blood Component Type RED CELLS,LR    Unit division 00    Status of Unit ALLOCATED    Transfusion Status OK TO TRANSFUSE    Crossmatch Result COMPATIBLE    Unit Number [id number]    Blood Component Type RED CELLS,LR    Unit division 00    Status of Unit ALLOCATED    Transfusion Status OK TO TRANSFUSE    Crossmatch Result COMPATIBLE    Unit Number [id number]  Blood Component Type RED CELLS,LR    Unit division 00    Status of Unit ALLOCATED    Transfusion Status OK TO TRANSFUSE    Crossmatch Result COMPATIBLE    Unit Number [id number]    Blood Component Type RED CELLS,LR    Unit division 00    Status of Unit ALLOCATED     Transfusion Status OK TO TRANSFUSE    Crossmatch Result COMPATIBLE   Protime-INR     Status: Abnormal   Collection Time: 12/13/22 11:40 PM  Result Value Ref Range   Prothrombin Time 28.2 (H) 11.4 - 15.2 seconds   INR 2.6 (H) 0.8 - 1.2    Comment: (NOTE) INR goal varies based on device and disease states. Performed at [facility], [address], [place], [place] [address]   APTT     Status: Abnormal   Collection Time: 12/13/22 11:40 PM  Result Value Ref Range   aPTT 42 (H) 24 - 36 seconds    Comment:        IF BASELINE aPTT IS ELEVATED, SUGGEST PATIENT RISK ASSESSMENT BE USED TO DETERMINE APPROPRIATE ANTICOAGULANT THERAPY. Performed at [facility], [address], [place], [place] [address]   Prepare RBC     Status: None   Collection Time: 12/13/22 11:48 PM  Result Value Ref Range   Order Confirmation      ORDER PROCESSED BY BLOOD BANK Performed at [facility], [address] [place], [place] [address]   I-stat chem 8, ED (not at [facility])     Status: Abnormal   Collection Time: 12/13/22 11:53 PM  Result Value Ref Range   Sodium 140 135 - 145 mmol/L   Potassium 4.4 3.5 - 5.1 mmol/L   Chloride 97 (L) 98 - 111 mmol/L   BUN 36 (H) 8 - 23 mg/dL   Creatinine, Ser 8.65 (H) 0.61 - 1.24 mg/dL   Glucose, Bld 784 (H) 70 - 99 mg/dL    Comment: Glucose reference range applies only to samples taken after fasting for at least 8 hours.   Calcium, Ion 1.14 (L) 1.15 - 1.40 mmol/L   TCO2 35 (H) 22 - 32 mmol/L   Hemoglobin 13.9 13.0 - 17.0 g/dL   HCT 69.6 29.5 - 28.4 %  Prepare RBC (crossmatch)     Status: None   Collection Time: 12/14/22  1:35 AM  Result Value Ref Range   Order Confirmation      ORDER PROCESSED BY BLOOD BANK Performed at [facility], [address], [place], [place] [address]   DIC Panel ONCE - STAT     Status: Abnormal   Collection Time: 12/14/22  3:16 AM  Result Value Ref Range   Prothrombin Time 21.6 (H) 11.4 - 15.2 seconds    INR 1.9 (H) 0.8 - 1.2    Comment: (NOTE) INR goal varies based on device and disease states.    aPTT 39 (H) 24 - 36 seconds    Comment:        IF BASELINE aPTT IS ELEVATED, SUGGEST PATIENT RISK ASSESSMENT BE USED TO DETERMINE APPROPRIATE ANTICOAGULANT THERAPY.    Fibrinogen 181 (L) 210 - 475 mg/dL    Comment: (NOTE) Fibrinogen results may be underestimated in patients receiving thrombolytic therapy.    D-Dimer, Quant 0.29 0.00 - 0.50 ug/mL-FEU    Comment: (NOTE) At the manufacturer cut-off value of 0.5 g/mL FEU, this assay has a negative predictive value of  95-100%.This assay is intended for use in conjunction with a clinical pretest probability (PTP) assessment model to exclude pulmonary embolism (PE) and deep venous thrombosis (DVT) in outpatients suspected of PE or DVT. Results should be correlated with clinical presentation.    Platelets 132 (L) 150 - 400 K/uL    Comment: REPEATED TO VERIFY   Smear Review NO SCHISTOCYTES SEEN     Comment: Performed at [facility], [address], [place], [place] [address]  CBC     Status: Abnormal   Collection Time: 12/14/22  3:16 AM  Result Value Ref Range   WBC 10.3 4.0 - 10.5 K/uL   RBC 3.27 (L) 4.22 - 5.81 MIL/uL   Hemoglobin 10.0 (L) 13.0 - 17.0 g/dL   HCT 60.4 (L) 54.0 - 98.1 %   MCV 95.7 80.0 - 100.0 fL   MCH 30.6 26.0 - 34.0 pg   MCHC 31.9 30.0 - 36.0 g/dL   RDW 19.1 (H) 47.8 - 29.5 %   Platelets 145 (L) 150 - 400 K/uL   nRBC 0.3 (H) 0.0 - 0.2 %    Comment: Performed at [facility], [address] [place], [place] [address]  Lactic acid, plasma     Status: None   Collection Time: 12/14/22  3:16 AM  Result Value Ref Range   Lactic Acid, Venous 1.2 0.5 - 1.9 mmol/L    Comment: Performed at [facility], [address], [place], [place] [address]  Basic metabolic panel     Status: Abnormal   Collection Time: 12/14/22  3:16 AM  Result Value Ref Range   Sodium 137 135 - 145 mmol/L   Potassium 3.2  (L) 3.5 - 5.1 mmol/L   Chloride 99 98 - 111 mmol/L   CO2 27 22 - 32 mmol/L   Glucose, Bld 115 (H) 70 - 99 mg/dL    Comment: Glucose reference range applies only to samples taken after fasting for at least 8 hours.   BUN 22 8 - 23 mg/dL   Creatinine, Ser 4.69 (H) 0.61 - 1.24 mg/dL   Calcium 7.8 (L) 8.9 - 10.3 mg/dL   GFR, Estimated 8 (L) >60 mL/min    Comment: (NOTE) Calculated using the CKD-EPI Creatinine Equation (2021)    Anion gap 11 5 - 15    Comment: Performed at [facility], [address] [place], [place] [address]  Prepare fresh frozen plasma     Status: None (Preliminary result)   Collection Time: 12/14/22  3:30 AM  Result Value Ref Range   Unit Number [id number]    Blood Component Type THW PLS APHR    Unit division B0    Status of Unit ISSUED    Transfusion Status      OK TO TRANSFUSE Performed at [facility], [address], [place], [place] [address]    No results found.  Pending Labs Unresulted Labs (From admission, onward)     Start     Ordered   12/14/22 0500  CBC  Every 6 hours (unscheduled),   R     See Hyperspace for full Linked Orders Report.   12/14/22 0307   12/14/22 0500  Protime-INR  Every 6 hours,   R     See Hyperspace for full Linked Orders Report.   12/14/22 0307   12/14/22 0310  Lactic acid, plasma  STAT Now then every 3 hours,   R      12/14/22 0310  12/14/22 0301  Hemoglobin and hematocrit, blood  ONCE - STAT,   STAT        12/14/22 0300            Vitals/Pain Today's Vitals   12/14/22 0345 12/14/22 0411 12/14/22 0415 12/14/22 0430  BP:  (!) 82/49    Pulse:      Resp: 16  16 16   Temp:      TempSrc:  Oral    SpO2:      Weight:      Height:      PainSc:        Isolation Precautions No active isolations  Medications Medications  norepinephrine (LEVOPHED) 4mg  in (0.016 mg/mL) premix infusion (0 mcg/min Intravenous Stopped 12/14/22 0301)  0.9 %  sodium chloride infusion (has no administration in time  range)  docusate sodium (COLACE) capsule 100 mg (has no administration in time range)  polyethylene glycol (MIRALAX / GLYCOLAX) packet 17 g (has no administration in time range)  pantoprazole (PROTONIX) injection 40 mg (40 mg Intravenous Given 12/14/22 0332)  0.9 %  sodium chloride infusion (Manually program via Guardrails IV Fluids) (0 mLs Intravenous Stopped 12/14/22 0028)  prothrombin complex conc human (KCENTRA) IVPB 5,390 Units (0 Units Intravenous Stopped 12/14/22 0155)  0.9 %  sodium chloride infusion (Manually program via Guardrails IV Fluids) (0 mLs Intravenous Stopped 12/14/22 0108)  diphenhydrAMINE (BENADRYL) injection 25 mg (25 mg Intravenous Given 12/14/22 0024)  acetaminophen (TYLENOL) tablet 650 mg (650 mg Oral Given 12/14/22 0024)  0.9 %  sodium chloride infusion (Manually program via Guardrails IV Fluids) (0 mLs Intravenous Stopped 12/14/22 0503)  calcium gluconate 1 g/ 50 mL sodium chloride IVPB (0 mg Intravenous Stopped 12/14/22 0503)  0.9 %  sodium chloride infusion (Manually program via Guardrails IV Fluids) ( Intravenous New Bag/Given 12/14/22 0418)    Mobility non-ambulatory     Focused Assessments Renal Assessment Handoff:  Hemodialysis Schedule: Hemodialysis Schedule: Monday/Wednesday/Friday Last Hemodialysis date and time: 12/11/22   Restricted appendage: left arm   R Recommendations: See Admitting Provider Note  Report given to:   Additional Notes:

## 2022-12-15 DIAGNOSIS — Z7901 Long term (current) use of anticoagulants: Secondary | ICD-10-CM

## 2022-12-15 DIAGNOSIS — K317 Polyp of stomach and duodenum: Secondary | ICD-10-CM | POA: Diagnosis not present

## 2022-12-15 DIAGNOSIS — N186 End stage renal disease: Secondary | ICD-10-CM | POA: Diagnosis not present

## 2022-12-15 DIAGNOSIS — Z86010 Personal history of colonic polyps: Secondary | ICD-10-CM

## 2022-12-15 DIAGNOSIS — K5730 Diverticulosis of large intestine without perforation or abscess without bleeding: Secondary | ICD-10-CM

## 2022-12-15 DIAGNOSIS — Z992 Dependence on renal dialysis: Secondary | ICD-10-CM | POA: Diagnosis not present

## 2022-12-15 DIAGNOSIS — I515 Myocardial degeneration: Secondary | ICD-10-CM

## 2022-12-15 DIAGNOSIS — R578 Other shock: Secondary | ICD-10-CM | POA: Diagnosis not present

## 2022-12-15 LAB — RENAL FUNCTION PANEL
Albumin: 2.5 g/dL — ABNORMAL LOW (ref 3.5–5.0)
Anion gap: 11 (ref 5–15)
BUN: 19 mg/dL (ref 8–23)
CO2: 26 mmol/L (ref 22–32)
Calcium: 8.5 mg/dL — ABNORMAL LOW (ref 8.9–10.3)
Chloride: 99 mmol/L (ref 98–111)
Creatinine, Ser: 5.16 mg/dL — ABNORMAL HIGH (ref 0.61–1.24)
GFR, Estimated: 11 mL/min — ABNORMAL LOW (ref >60)
Glucose, Bld: 103 mg/dL — ABNORMAL HIGH (ref 70–99)
Phosphorus: 2.3 mg/dL — ABNORMAL LOW (ref 2.5–4.6)
Potassium: 3.4 mmol/L — ABNORMAL LOW (ref 3.5–5.1)
Sodium: 136 mmol/L (ref 135–145)

## 2022-12-15 LAB — CBC
HCT: 27 % — ABNORMAL LOW (ref 39.0–52.0)
HCT: 28.7 % — ABNORMAL LOW (ref 39.0–52.0)
HCT: 29 % — ABNORMAL LOW (ref 39.0–52.0)
HCT: 29.2 % — ABNORMAL LOW (ref 39.0–52.0)
HCT: 32.3 % — ABNORMAL LOW (ref 39.0–52.0)
Hemoglobin: 10.8 g/dL — ABNORMAL LOW (ref 13.0–17.0)
Hemoglobin: 8.7 g/dL — ABNORMAL LOW (ref 13.0–17.0)
Hemoglobin: 9.6 g/dL — ABNORMAL LOW (ref 13.0–17.0)
Hemoglobin: 9.6 g/dL — ABNORMAL LOW (ref 13.0–17.0)
Hemoglobin: 9.8 g/dL — ABNORMAL LOW (ref 13.0–17.0)
MCH: 30 pg (ref 26.0–34.0)
MCH: 30.2 pg (ref 26.0–34.0)
MCH: 30.9 pg (ref 26.0–34.0)
MCH: 31.1 pg (ref 26.0–34.0)
MCH: 31.6 pg (ref 26.0–34.0)
MCHC: 32.2 g/dL (ref 30.0–36.0)
MCHC: 33.1 g/dL (ref 30.0–36.0)
MCHC: 33.4 g/dL (ref 30.0–36.0)
MCHC: 33.4 g/dL (ref 30.0–36.0)
MCHC: 33.6 g/dL (ref 30.0–36.0)
MCV: 91.2 fL (ref 80.0–100.0)
MCV: 92.3 fL (ref 80.0–100.0)
MCV: 92.7 fL (ref 80.0–100.0)
MCV: 93.1 fL (ref 80.0–100.0)
MCV: 94.4 fL (ref 80.0–100.0)
Platelets: 115 10*3/uL — ABNORMAL LOW (ref 150–400)
Platelets: 122 10*3/uL — ABNORMAL LOW (ref 150–400)
Platelets: 122 10*3/uL — ABNORMAL LOW (ref 150–400)
Platelets: 125 10*3/uL — ABNORMAL LOW (ref 150–400)
Platelets: 129 10*3/uL — ABNORMAL LOW (ref 150–400)
RBC: 2.9 MIL/uL — ABNORMAL LOW (ref 4.22–5.81)
RBC: 3.04 MIL/uL — ABNORMAL LOW (ref 4.22–5.81)
RBC: 3.15 MIL/uL — ABNORMAL LOW (ref 4.22–5.81)
RBC: 3.18 MIL/uL — ABNORMAL LOW (ref 4.22–5.81)
RBC: 3.5 MIL/uL — ABNORMAL LOW (ref 4.22–5.81)
RDW: 17.9 % — ABNORMAL HIGH (ref 11.5–15.5)
RDW: 18 % — ABNORMAL HIGH (ref 11.5–15.5)
RDW: 18.1 % — ABNORMAL HIGH (ref 11.5–15.5)
RDW: 18.4 % — ABNORMAL HIGH (ref 11.5–15.5)
RDW: 18.5 % — ABNORMAL HIGH (ref 11.5–15.5)
WBC: 10.6 10*3/uL — ABNORMAL HIGH (ref 4.0–10.5)
WBC: 11.1 10*3/uL — ABNORMAL HIGH (ref 4.0–10.5)
WBC: 11.1 10*3/uL — ABNORMAL HIGH (ref 4.0–10.5)
WBC: 11.8 10*3/uL — ABNORMAL HIGH (ref 4.0–10.5)
WBC: 12.2 10*3/uL — ABNORMAL HIGH (ref 4.0–10.5)
nRBC: 0 % (ref 0.0–0.2)
nRBC: 0 % (ref 0.0–0.2)
nRBC: 0 % (ref 0.0–0.2)
nRBC: 0 % (ref 0.0–0.2)
nRBC: 0 % (ref 0.0–0.2)

## 2022-12-15 LAB — TYPE AND SCREEN
Unit division: 0
Unit division: 0
Unit division: 0
Unit division: 0
Unit division: 0

## 2022-12-15 LAB — MAGNESIUM
Magnesium: 1.8 mg/dL (ref 1.7–2.4)
Magnesium: 2.4 mg/dL (ref 1.7–2.4)

## 2022-12-15 LAB — PREPARE FRESH FROZEN PLASMA

## 2022-12-15 LAB — HEMOGLOBIN AND HEMATOCRIT, BLOOD
HCT: 24.1 % — ABNORMAL LOW (ref 39.0–52.0)
Hemoglobin: 7.8 g/dL — ABNORMAL LOW (ref 13.0–17.0)

## 2022-12-15 LAB — BASIC METABOLIC PANEL
Anion gap: 8 (ref 5–15)
BUN: 20 mg/dL (ref 8–23)
CO2: 27 mmol/L (ref 22–32)
Calcium: 8.5 mg/dL — ABNORMAL LOW (ref 8.9–10.3)
Chloride: 99 mmol/L (ref 98–111)
Creatinine, Ser: 5.59 mg/dL — ABNORMAL HIGH (ref 0.61–1.24)
GFR, Estimated: 10 mL/min — ABNORMAL LOW (ref >60)
Glucose, Bld: 78 mg/dL (ref 70–99)
Potassium: 3.4 mmol/L — ABNORMAL LOW (ref 3.5–5.1)
Sodium: 134 mmol/L — ABNORMAL LOW (ref 135–145)

## 2022-12-15 LAB — GLUCOSE, CAPILLARY
Glucose-Capillary: 100 mg/dL — ABNORMAL HIGH (ref 70–99)
Glucose-Capillary: 106 mg/dL — ABNORMAL HIGH (ref 70–99)
Glucose-Capillary: 154 mg/dL — ABNORMAL HIGH (ref 70–99)
Glucose-Capillary: 66 mg/dL — ABNORMAL LOW (ref 70–99)
Glucose-Capillary: 80 mg/dL (ref 70–99)
Glucose-Capillary: 93 mg/dL (ref 70–99)

## 2022-12-15 LAB — BPAM RBC
Blood Product Expiration Date: 202406292359
Blood Product Expiration Date: 202406292359
Blood Product Expiration Date: 202406292359
ISSUE DATE / TIME: 202406022352
ISSUE DATE / TIME: 202406030027
ISSUE DATE / TIME: 202406030155
Unit Type and Rh: 5100
Unit Type and Rh: 5100

## 2022-12-15 LAB — PROTIME-INR
INR: 1.4 — ABNORMAL HIGH (ref 0.8–1.2)
Prothrombin Time: 17.4 seconds — ABNORMAL HIGH (ref 11.4–15.2)

## 2022-12-15 LAB — HEPATITIS B SURFACE ANTIBODY, QUANTITATIVE: Hep B S AB Quant (Post): 413 m[IU]/mL (ref >9.9)

## 2022-12-15 LAB — BPAM FFP
Blood Product Expiration Date: 202406082359
ISSUE DATE / TIME: 202406030355

## 2022-12-15 LAB — HEMOGLOBIN A1C
Hgb A1c MFr Bld: 5.1 % (ref 4.8–5.6)
Mean Plasma Glucose: 100 mg/dL

## 2022-12-15 MED ORDER — MIDODRINE HCL 5 MG PO TABS: 10.0000 mg | ORAL_TABLET | Freq: Three times a day (TID) | ORAL | Status: DC

## 2022-12-15 MED ORDER — ALBUMIN HUMAN 25 % IV SOLN
25.0000 g | INTRAVENOUS | Status: AC
  Administered 2022-12-15: 25 g via INTRAVENOUS
  Filled 2022-12-15: qty 100

## 2022-12-15 MED ORDER — MIDODRINE HCL 5 MG PO TABS
10.0000 mg | ORAL_TABLET | Freq: Three times a day (TID) | ORAL | Status: DC
  Administered 2022-12-15: 10 mg via ORAL
  Filled 2022-12-15: qty 2

## 2022-12-15 MED ORDER — ACETAMINOPHEN 325 MG PO TABS
650.0000 mg | ORAL_TABLET | Freq: Four times a day (QID) | ORAL | Status: DC | PRN
  Administered 2022-12-15 – 2023-01-01 (×4): 650 mg
  Filled 2022-12-15 (×5): qty 2

## 2022-12-15 MED ORDER — PREGABALIN 75 MG PO CAPS
75.0000 mg | ORAL_CAPSULE | Freq: Every day | ORAL | Status: DC
  Administered 2022-12-15 – 2023-01-02 (×17): 75 mg via ORAL
  Filled 2022-12-15 (×17): qty 1

## 2022-12-15 MED ORDER — MAGNESIUM SULFATE 2 GM/50ML IV SOLN
2.0000 g | Freq: Once | INTRAVENOUS | Status: AC
  Administered 2022-12-15: 2 g via INTRAVENOUS
  Filled 2022-12-15: qty 50

## 2022-12-15 MED ORDER — MIDODRINE HCL 5 MG PO TABS
5.0000 mg | ORAL_TABLET | Freq: Three times a day (TID) | ORAL | Status: DC
  Administered 2022-12-15: 5 mg via ORAL
  Filled 2022-12-15: qty 1

## 2022-12-15 MED ORDER — POTASSIUM CHLORIDE CRYS ER 20 MEQ PO TBCR
20.0000 meq | EXTENDED_RELEASE_TABLET | Freq: Once | ORAL | Status: AC
  Administered 2022-12-15: 20 meq via ORAL
  Filled 2022-12-15: qty 1

## 2022-12-15 MED ORDER — MIDODRINE HCL 5 MG PO TABS: 10.0000 mg | ORAL_TABLET | Freq: Two times a day (BID) | ORAL | Status: DC | PRN

## 2022-12-15 MED ORDER — MIDODRINE HCL 5 MG PO TABS: 5.0000 mg | ORAL_TABLET | Freq: Three times a day (TID) | ORAL | Status: DC

## 2022-12-15 NOTE — Progress Notes (Signed)
Per HD-RN, 1.6 liters was pulled on completion. Patient with continued hypotension, requiring levophed to meet map goal. His aline pressure is labile with a dampened wave making it hard to accept readings. Contacted Elink MD for further intervention

## 2022-12-15 NOTE — Progress Notes (Signed)
Patient ID: Justin [name], male   DOB: June 28, 1946, 77 y.o.   MRN: [id number] S: Tolerated HD yesterday with UF of 1.6L but did require pressor support.  More awake today. O:BP (!) 92/29   Pulse (!) 113   Temp 98.5 F (36.9 C) (Axillary)   Resp 15   Ht 5\' 10"  (1.778 m)   Wt 85.2 kg   SpO2 95%   BMI 26.95 kg/m   Intake/Output Summary (Last 24 hours) at 12/15/2022 0927 Last data filed at 12/15/2022 0600 Gross per 24 hour  Intake 167.67 ml  Output 1600 ml  Net -1432.33 ml   Intake/Output: I/O last 3 completed shifts: In: 1340.6 [I.V.:130.6; Blood:836; IV Piggyback:374] Out: 1700 [Drains:100; Other:1600]  Intake/Output this shift:  No intake/output data recorded. Weight change: -16.5 kg Gen: NAD CVS: Tachy Resp: CTA Abd: +BS, soft, NT/ND Ext: no edema, LUE AVF +T/B  Recent Labs  Lab 12/13/22 2340 12/13/22 2353 12/14/22 0316 12/14/22 1746 12/15/22 0500  NA 137 140 137  --  136  K 3.5 4.4 3.2* 3.6 3.4*  CL 98 97* 99  --  99  CO2 30  --  27  --  26  GLUCOSE 103* 101* 115*  --  103*  BUN 23 36* 22  --  19  CREATININE 6.76* 7.00* 6.46*  --  5.16*  ALBUMIN 1.9*  --   --   --  2.5*  CALCIUM 8.2*  --  7.8*  --  8.5*  PHOS  --   --   --   --  2.3*  AST 30  --   --   --   --   ALT 15  --   --   --   --    Liver Function Tests: Recent Labs  Lab 12/13/22 2340 12/15/22 0500  AST 30  --   ALT 15  --   ALKPHOS 62  --   BILITOT 0.8  --   PROT 6.0*  --   ALBUMIN 1.9* 2.5*   No results for input(s): "LIPASE", "AMYLASE" in the last 168 hours. No results for input(s): "AMMONIA" in the last 168 hours. CBC: Recent Labs  Lab 12/13/22 2340 12/13/22 2353 12/14/22 0316 12/14/22 0602 12/14/22 1037 12/14/22 1815 12/14/22 2340  WBC 10.2  --  10.3  --  10.6* 11.1* 11.8*  HGB 8.4*   < > 10.0*   < > 11.1* 11.0* 10.8*  HCT 28.3*   < > 31.3*   < > 33.7* 33.9* 32.3*  MCV 96.9  --  95.7  --  93.6 91.6 92.3  PLT 205  --  132*  145*  --  136* 133* 129*   < > = values in this  interval not displayed.   Cardiac Enzymes: No results for input(s): "CKTOTAL", "CKMB", "CKMBINDEX", "TROPONINI" in the last 168 hours. CBG: Recent Labs  Lab 12/14/22 1624 12/14/22 2050 12/15/22 0001 12/15/22 0444 12/15/22 0748  GLUCAP 77 86 80 106* 93    Iron Studies: No results for input(s): "IRON", "TIBC", "TRANSFERRIN", "FERRITIN" in the last 72 hours. Studies/Results: CT ANGIO GI BLEED  Result Date: 12/14/2022 CLINICAL DATA:  77 year old with rectal bleeding. EXAM: CTA ABDOMEN AND PELVIS WITHOUT AND WITH CONTRAST TECHNIQUE: Multidetector CT imaging of the abdomen and pelvis was performed using the standard protocol during bolus administration of intravenous contrast. Multiplanar reconstructed images and MIPs were obtained and reviewed to evaluate the vascular anatomy. RADIATION DOSE REDUCTION: This exam was performed according to the departmental  dose-optimization program which includes automated exposure control, adjustment of the mA and/or kV according to patient size and/or use of iterative reconstruction technique. CONTRAST:  OMNIPAQUE IOHEXOL 350 MG/ML SOLN COMPARISON:  09/10/2022 FINDINGS: VASCULAR Aorta: Atherosclerotic disease in the abdominal aorta without aneurysm, dissection or significant stenosis. Celiac: Patent without evidence of aneurysm, dissection, vasculitis or significant stenosis. SMA: Patent without evidence of aneurysm, dissection, vasculitis or significant stenosis. Renals: Atherosclerotic disease involving bilateral renal arteries. There is at least mild stenosis involving the right renal artery but limited evaluation. No evidence for aneurysm or dissections involving the renal arteries. IMA: Patent Inflow: Atherosclerotic disease involving the bilateral iliac arteries without aneurysm, dissection or significant stenosis. Proximal Outflow: Proximal femoral arteries are patent bilaterally. Veins: Contrast in the IVC, right hepatic vein and renal veins related to  the injection being performed through the right femoral central line. There is a right common femoral central line with the tip in the right external iliac vein. There also appears to be contrast in the right internal iliac veins. Review of the MIP images confirms the above findings. NON-VASCULAR Lower chest: Trace right pleural fluid and small left pleural effusion. Compressive atelectasis at both lung bases. Coronary arteries are heavily calcified. Compressive atelectasis at both lung bases. Hepatobiliary: Cholecystostomy tube is positioned in the gallbladder. Gallbladder is decompressed. Mild intrahepatic biliary dilatation. No significant extrahepatic biliary dilatation. Pancreas: Unremarkable. No pancreatic ductal dilatation or surrounding inflammatory changes. Spleen: Normal in size without focal abnormality. Adrenals/Urinary Tract: Adrenal glands are within normal limits. Chronic atrophy involving both kidneys with mild perinephric edema. 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 No hydronephrosis. Urinary bladder is decompressed. Stomach/Bowel: Probable small diverticulum along the posterior aspect of the gastric fundus. No evidence for active GI bleeding. Normal appendix. Small metallic clip in the colon near the hepatic flexure on image 49/11. Moderate amount of stool in the colon. Mild distention of the rectum containing high-density material which could be related to blood products. Perirectal and presacral edema. No evidence for acute bowel inflammation or obstruction. Lymphatic: No lymph node enlargement in the abdomen or pelvis. Reproductive: Prostate is enlarged with fiducial markers around and involving the prostate. Other: Mild subcutaneous edema. Presacral and perirectal edema without free fluid. Left inguinal hernia containing fat. Negative for free air. Musculoskeletal: Diffuse sclerosis in the bones  compatible with end-stage renal disease. Bilateral pars defects at L5 without significant anterolisthesis of L5 on S1. Irregularity of the endplates at L3-L4 is similar to the recent comparison examination. Findings could represent extensive degenerative changes with Schmorl's nodes. Multilevel degenerative facet disease most prominent at L3-L4. IMPRESSION: 1. Mild distention of the rectum containing high-density material which could be related to blood products. No evidence for active GI bleeding. 2. Cholecystostomy tube is positioned in the gallbladder. Gallbladder is decompressed. 3. Small left pleural effusion with compressive atelectasis at both lung bases. 4. Mild subcutaneous edema with presacral and perirectal edema which could be related to fluid overload. 5. Diffuse sclerosis in the bones compatible with end-stage renal disease. 6. Aortic Atherosclerosis (ICD10-I70.0). Electronically Signed   By: Richarda Overlie M.D.   On: 12/14/2022 08:45    Chlorhexidine Gluconate Cloth  6 each Topical Q0600   insulin aspart  0-6 Units Subcutaneous Q4H   midodrine  10 mg Oral TID WC   pantoprazole (PROTONIX) IV  40 mg Intravenous Q12H   pregabalin  75 mg Oral Daily    BMET  Component Value Date/Time   NA 136 12/15/2022 0500   K 3.4 (L) 12/15/2022 0500   CL 99 12/15/2022 0500   CO2 26 12/15/2022 0500   GLUCOSE 103 (H) 12/15/2022 0500   BUN 19 12/15/2022 0500   CREATININE 5.16 (H) 12/15/2022 0500   CALCIUM 8.5 (L) 12/15/2022 0500   GFRNONAA 11 (L) 12/15/2022 0500   GFRAA 8 (L) 02/01/2020 0424   CBC    Component Value Date/Time   WBC 11.8 (H) 12/14/2022 2340   RBC 3.50 (L) 12/14/2022 2340   HGB 10.8 (L) 12/14/2022 2340   HCT 32.3 (L) 12/14/2022 2340   PLT 129 (L) 12/14/2022 2340   MCV 92.3 12/14/2022 2340   MCH 30.9 12/14/2022 2340   MCHC 33.4 12/14/2022 2340   RDW 18.5 (H) 12/14/2022 2340   LYMPHSABS 0.9 11/23/2022 0137   MONOABS 1.1 (H) 11/23/2022 0137   EOSABS 0.1 11/23/2022 0137    BASOSABS 0.1 11/23/2022 0137    Dialysis Orders: [facility] 3h  450/600  99kg  2/2.5 bath  LUA AVF  Hep none - last HD 4/26, post wt 111kg (lowered during hospitalization last month to 96kg) - rocaltrol 0.75 mcg po tiw - venofer 100mg  tiw thru 5/08 - mircera 200 mcg IV q 2 wks, last 4/26 - last Hb 7.7 on 4/24   Assessment/Plan:  Hemorrhagic shock - pressors to keep MAP >65.  He has received 3 units PRBC's.  CT angio negative for active bleed.  Hold eliquis for now.  Transfuse as needed.  Agree with midodrine and titrate norepi as able.  ESRD -  Tolerated HD with pressor support yesterday.  Plan for HD tomorrow to keep on schedule.  Would like to have HD in ICU again just incase pressor support is required.    Hypertension/volume  -  currently below his edw, however did receive large amount of blood products.  Was able to UF 1.6 L yesterday.  Will plan for HD at bedside tomorrow and use pressors prn for MAP >65.  Wean pressors as able.  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  S/p blood transfusions x 3.  Follow H/H and transfuse prn.  Metabolic bone disease -  continue with home meds  Nutrition -  renal diet, carb modified.  A fib/flutter - would not resume eliquis at this time as risks outweigh benefits.    Acute cholecystitis s/p percutaneous cholecystostomy tube - from OSH 4/2-4/23/24 and felt to be too high risk for surgical intervention.  Drain exchanged 12/01/22 and f/u with surgery at [facility] in July.  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  Currently DNR.  May benefit from ongoing discussions with palliative care for possible transition to comfort if he were to deteriorate further.  Irena Cords, MD [facility]

## 2022-12-15 NOTE — Progress Notes (Addendum)
eLink Physician-Brief Progress Note Patient Name: Justin [name] DOB: 1946-04-26 MRN: [id number]   Date of Service  12/15/2022  HPI/Events of Note  77 year old with a history of atrial fibrillation on apixaban and previous GI bleed with rectal AVM, ESRD and moderate AS who was transferred from an outside facility with new onset rectal bleeding. Received 3 units PRBC, Kcentra and was started on pressors in the setting of hypotension.   Patient started becoming hypotensive towards the end of his hemodialysis run.  He was given albumin bolus and maintain blood pressures for some time, but was eventually started on norepinephrine at 5 mcg to maintain maps greater than 65/SBP greater than 100.   Arterial line has been dampened and intermittently malfunctioning throughout the day.  Cuff pressures as measured on his feet have been somewhat lower.  eICU Interventions  For now, maintain arterial line and titrate norepinephrine as needed to SBP greater than 100 or MAP greater than 65.  Wide pulse pressure is noted.  Pulse pressure variation is quite significant.  Repeat albumin 25% bolus.  Will add on as needed midodrine.  May need additional resuscitation   0553 - Added acetaminophen for headache  Intervention Category Intermediate Interventions: Hypotension - evaluation and management  [name] 12/15/2022, 2:14 AM

## 2022-12-15 NOTE — Progress Notes (Addendum)
NAME:  Justin [name], MRN:  [id number], DOB:  [birth year], LOS: 1 ADMISSION DATE:  12/13/2022, CONSULTATION DATE:  12/14/22 REFERRING MD:  Blinda Leatherwood , CHIEF COMPLAINT:  GIB   History of Present Illness:  77 yo male with hx of A fib on eliquis and rectal AVM with recurrent GI bleeding presented from NH with rectal bleeding.  Developed hypotension.  Received PRBC and Kcentra.  Started on pressors.  PCCM asked to admit to ICU.  Pertinent  Medical History  ESRD, A fib, Rectal AVM, Chronic hypotension, Moderate aortic stenosis, Prostate cancer, DM type 2, DM retinopathy and peripheral neuropathy, Gout, HTN  Significant Hospital Events: Including procedures, antibiotic start and stop dates in addition to other pertinent events   3/07 Colonoscopy >> cecal polyp not removed, solitary ulcer in proximal transverse colon from prior polypectomy 3 clips placed, solitary ulcer mid transverse colon 2 clips placed, few polyps in transverse colon, diverticulosis in transverse colon and Lt colon, blood in rectum and recto-sigmoid colon, one 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 6/03 progressive hem shock. 3 prbc, kcentra, CVC placed and on pressors. PCCM to admit; CT angio GI bleed negative for active bleeding  Interim History / Subjective:  iHD overnight, -1.6 UF  One bloody BM overnight and additional medium one this morning> dark red, few clots reported Hgb 10.8> 9.8 Pt c/o of being hungry, denies abd pain, N/V, having chronic LE pain/ neuropathy Remains on NE 1-2 mcg> cuff pressures (on RLE) and aline not correlating overnight, but aline correlates with BP on RUE  Objective   Blood pressure (!) 92/29, pulse (!) 113, temperature 98.5 F (36.9 C),  temperature source Axillary, resp. rate 15, height 5\' 10"  (1.778 m), weight 85.2 kg, SpO2 95 %.        Intake/Output Summary (Last 24 hours) at 12/15/2022 0917 Last data filed at 12/15/2022 0600 Gross per 24 hour  Intake 167.67 ml  Output 1600 ml  Net -1432.33 ml   Filed Weights   12/14/22 0706 12/14/22 1800 12/15/22 0500  Weight: 87.8 kg 88.1 kg 85.2 kg    Examination: General:  chronically ill appearing elderly male lying in bed in NAD HEENT: MM pink/moist, pupils 4/r Neuro: Awake, oriented x2, intermittent on time, f/c, MAE CV: rr, +murmur, R femoral CVL PULM:  non labored, clear, diminished in bases GI: soft, bs+, NT, ND, RUQ drain w/ bilious output Extremities: warm/dry, unable to exam legs> pt refused exam based on pain Skin: no rashes  Labs/ images reviewed.  Afebrile Net -359 ml  Resolved Hospital Problem list     Assessment & Plan:   Acute lower GI bleeding with hx of radiation proctitis, distal rectum angiodysplastic lesion, and rectal AVM. - followed by Dr. Leonides Schanz outpt with [facility] - CT angio abd/pelvis from 6/03 negative for active bleeding - GI consulted by ER - cont PPI BID - 2 BM since yesterday, likely clearing of blood, BM this morning reportedly dark/ few clots.  Continue to monitor.  Hgb drop 1gm.  Recheck this afternoon.   - continue to hold eliquis/ blood thinners this admit given recurrent GIB.  Will need further outpt followup with GI/ cardiology to determine risk/ benefits of resuming AC for afib given recurrent GIBs - advance  diet as tolerated  Hemorrhagic shock. - Aline correlates with cuff pressures in RUE.  MAP goal > 65.  Nearly off NE.  D/c aline and likely femoral CVL when off pressors.  - takes midodrine 10mg  on HD days prn.  Will start midodrine 5mg  TID, and monitor - if remains hemodynamically stable, consider transfer out of ICU later this afternoon  Acute blood loss anemia 2nd to GI bleeding. - baseline Hgb ~8. - Hgb 1gm drop.   Repeat at 1700, monitor for worsening or changing rectal bleeding - transfuse for Hb < 7  A fib/flutter on eliquis. Aortic stenosis. Chronic hypotension on midodrine with HD. - Kcentra on 6/03 - monitor on telemetry - cont to hold eliquis given recurrent GIB.  Can be determined outpt f/u - check Mag, goal > 2 - fluid removal w/iHD - BP management as above  ESRD. Hypokalemia. - per Nephrology, appreciate assistance.  S/p iHD overnight  Acute cholecystitis s/p percutaneous cholecystostomy tube. - was at [facility] from 10/13/22 to 11/03/22 >> felt to be too high risk for surgical intervention - drain put in on 10/16/22 and exchanged on 12/01/22 - monitor drain output - f/u with surgery at [facility] in July   DM type 2. - SSI, controlled  Peripheral diabetic neuropathy Immobility - resume home lyrica - consult PT.  Pt states he has been immobile due to pain since 07/2022, admitted from rehab.  Pt states he does not want to return to the facility he came from.   Multiple pressure injuries, POA - cont WOC, frequent turns, resuming diet  Best Practice (right click and "Reselect all SmartList Selections" daily)   Diet/type: Regular consistency (see orders) DVT prophylaxis: SCD GI prophylaxis: PPI Lines: Central line Foley:  N/A Code Status:  DNR Last date of multidisciplinary goals of care discussion [daughter updated at bedside]  Pt updated on plan of care.  No family at bedside.   Labs       Latest Ref Rng & Units 12/15/2022    5:00 AM 12/14/2022    5:46 PM 12/14/2022    3:16 AM  CMP  Glucose 70 - 99 mg/dL 161   096   BUN 8 - 23 mg/dL 19   22   Creatinine 0.45 - 1.24 mg/dL 4.09   8.11   Sodium 914 - 145 mmol/L 136   137   Potassium 3.5 - 5.1 mmol/L 3.4  3.6  3.2   Chloride 98 - 111 mmol/L 99   99   CO2 22 - 32 mmol/L 26   27   Calcium 8.9 - 10.3 mg/dL 8.5   7.8        Latest Ref Rng & Units 12/14/2022   11:40 PM 12/14/2022    6:15 PM 12/14/2022   10:37 AM  CBC  WBC 4.0 -  10.5 K/uL 11.8  11.1  10.6   Hemoglobin 13.0 - 17.0 g/dL 78.2  95.6  21.3   Hematocrit 39.0 - 52.0 % 32.3  33.9  33.7   Platelets 150 - 400 K/uL 129  133  136     CBG (last 3)  Recent Labs    12/15/22 0001 12/15/22 0444 12/15/22 0748  GLUCAP 80 106* 93   Critical care time: 33 minutes     Posey Boyer, MSN, AG-ACNP-BC [facility] 12/15/2022, 9:18 AM  See Amion for pager If no response to pager, please call PCCM consult pager After 7:00 pm call Elink

## 2022-12-15 NOTE — Progress Notes (Signed)
PCCM Interval Note    Patient with sudden drop in SBP down to 60's and less responsive requiring restart of his levophed.  Patient then had another bowel movement, previously dark in color with clots, now c/w hematochezia.  Pending CBC has been sent. Patient denies any abd pain, N/V.    Failed medical management thus far of recurrent GIB.  Will consult [facility] for further recommendations.               Posey Boyer, MSN, AG-ACNP-BC [facility] 12/15/2022, 5:11 PM  See Amion for pager If no response to pager, please call PCCM consult pager After 7:00 pm call Elink

## 2022-12-15 NOTE — Consult Note (Signed)
Consultation  Referring Provider:      Primary Care Physician:  [name], DO Primary Gastroenterologist:         Reason for Consultation:          Impression / Plan:   Acute GI bleeding (recurrent problem) with melena and hematochezia, with hemorrhagic shock-I have seen remnants of the clotted blood saved by nursing staff, and she reported that it was voluminous.  Red-maroon clots.  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  Serial hemoglobin twice daily PPI, pressors and transfuse as needed.  I suspect this is a lower GI bleed but we really do not know.  He had a possible AVM on small bowel capsule endoscopy in April.  There is a submucosal mass seen by [place] in March and at the outside hospital subsequently.  There were also duodenal polyps. At this point would plan on an EGD tomorrow at some point.  I think it would be best to rule out significant upper GI bleeding.  He has eaten dinner tonight so this cannot be done now.  I do not think the 15-20 mm polyp in the cecum is bleeding.  Diverticular bleeding is certainly possible.  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  If there is recurrent significant hematochezia would consider repeating a CT angio  Iva Boop, MD, [facility] See AMION on call - gastroenterology for best contact person 12/15/2022 7:19 PM          HPI:   Justin [name]  is a 77 y.o. 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  He was treated with pressors and transfused and received Kcentra to correct bleeding related to Eliquis.  He improved his hemoglobin improved he was stabilized and came off pressors and then this evening he had another voluminous bloody stool.  The patient reports having melena prior to hospital admission and I do not think he was on iron.  He has a chronic indwelling drain because of cholecystostomy tube.  He was seen at our institution on November 09, 2022 by [facility], because of melena and decline in hemoglobin and he had a small bowel capsule endoscopy that admission that showed a possible AVM in the stomach.  He has not yet followed up for planned colonoscopy and EGD.  The submucosal mass seen at EGD had inconclusive biopsies.  3.6 cm mass.  He is currently without symptoms no pain no nausea or vomiting.  He is back on pressors.   Previous GI workup -06/15/22 s/p I&D of the abscess in December. Cultures +MDR E.coli. Completed several weeks of antibiotics. D/C to SNF for rehab.  - 09/10/22 CTA (-)active bleed. (+)distended GB with stones vs sludge.  --09/11/22 EGD "small stomach nodule,"  2 duodenal polyps. No biopsies and none removed due to concern for acute GIB. Colonoscopy 15mm polyp in cecum. 10mm polyp in transverse colon s/p removal. Path +tubular adenoma. Rectal AVMs/radiation proctitis s/p APC thought to be source of bleed. Plans were made for repeat EGD after outpatient follow up with GI. Advised to resume Eliquis after D/C. --09/17/22 colonoscopy recurrent bleeding of rectal AVMs s/p APC. Stigmata of bleeding  from prior polyp removal s/p clips. Advised to resume Eliquis 5 days after D/C.  Underwent EGD 4/1 [place] that revealed no abnormalities apart from submucosal gastric mass.  Biopsies were done.  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) 12/07/2019   Dialysis patient (HCC)    Gout    Hypercalcemia 11/14/2021   Hyperkalemia 10/19/2018   Hypertension    Hypotension 06/13/2022   Right posterior capsular opacification 02/27/2021   Septic shock (HCC)    Vitreous hemorrhage of right eye (HCC) 10/24/2019    Past Surgical History:  Procedure Laterality Date   A/V FISTULAGRAM Left 08/17/2017   Procedure: A/V FISTULAGRAM;  Surgeon: Renford Dills, MD;  Location: [facility] INVASIVE CV LAB;  Service: Cardiovascular;  Laterality: Left;   A/V SHUNT INTERVENTION N/A 08/17/2017   Procedure: A/V SHUNT INTERVENTION;  Surgeon: Renford Dills, MD;  Location: [facility] INVASIVE CV LAB;  Service: Cardiovascular;  Laterality: N/A;   AV FISTULA PLACEMENT Left 05/31/2015   Procedure: ARTERIOVENOUS (AV) FISTULA CREATION;  Surgeon: Renford Dills, MD;  Location: [facility] ORS;  Service: Vascular;  Laterality: Left;   COLONOSCOPY WITH PROPOFOL N/A 09/11/2022   Procedure: COLONOSCOPY WITH PROPOFOL;  Surgeon: Imogene Burn, MD;  Location: [facility] ENDOSCOPY;  Service: Gastroenterology;  Laterality: N/A;   COLONOSCOPY WITH PROPOFOL N/A 09/17/2022   Procedure:  COLONOSCOPY WITH PROPOFOL;  Surgeon: Benancio Deeds, MD;  Location: [facility] ENDOSCOPY;  Service: Gastroenterology;  Laterality: N/A;   ESOPHAGOGASTRODUODENOSCOPY (EGD) WITH PROPOFOL N/A 09/11/2022   Procedure: ESOPHAGOGASTRODUODENOSCOPY (EGD) WITH PROPOFOL;  Surgeon: Imogene Burn, MD;  Location: [facility] ENDOSCOPY;  Service: Gastroenterology;  Laterality: N/A;   excision bone spurs Bilateral 1989   feet   GIVENS CAPSULE STUDY N/A 11/10/2022   Procedure: GIVENS CAPSULE STUDY;  Surgeon: Shellia Cleverly, DO;  Location: [facility] ENDOSCOPY;  Service: Gastroenterology;  Laterality: N/A;   HEMOSTASIS CLIP PLACEMENT  09/11/2022   Procedure: HEMOSTASIS CLIP PLACEMENT;  Surgeon: Imogene Burn, MD;  Location: [facility] ENDOSCOPY;  Service: Gastroenterology;;   HEMOSTASIS CLIP PLACEMENT  09/17/2022   Procedure: HEMOSTASIS CLIP PLACEMENT;  Surgeon: Benancio Deeds, MD;  Location: [facility] ENDOSCOPY;  Service: Gastroenterology;;   HOT HEMOSTASIS N/A 09/11/2022   Procedure: HOT HEMOSTASIS (ARGON PLASMA COAGULATION/BICAP);  Surgeon: Imogene Burn, MD;  Location: [facility] ENDOSCOPY;  Service: Gastroenterology;  Laterality: N/A;   HOT HEMOSTASIS N/A 09/17/2022   Procedure: HOT HEMOSTASIS (ARGON PLASMA COAGULATION/BICAP);  Surgeon: Benancio Deeds, MD;  Location: [facility] ENDOSCOPY;  Service: Gastroenterology;  Laterality: N/A;   INCISION AND DRAINAGE PERIRECTAL  ABSCESS N/A 06/15/2022   Procedure: IRRIGATION AND DEBRIDEMENT PERIRECTAL ABSCESS;  Surgeon: Franky Macho, MD;  Location: [facility] ORS;  Service: General;  Laterality: N/A;   KNEE SURGERY Left 1998   arthroscopy   POLYPECTOMY  09/11/2022   Procedure: POLYPECTOMY;  Surgeon: Imogene Burn, MD;  Location: [facility] ENDOSCOPY;  Service: Gastroenterology;;   SHOULDER SURGERY Left 1994   rotator cuff    Family History  Problem Relation Age of Onset   Kidney disease Mother    Alcoholism Father    Alcoholism Brother      Social History   Tobacco Use   Smoking status: Former    Types:  Cigarettes    Quit date: 10/01/1978    Years since quitting: 44.2   Smokeless tobacco: Never  Vaping Use   Vaping Use: Never used  Substance Use Topics   Alcohol use: No   Drug use: No    Prior to Admission medications   Medication Sig Start Date End Date Taking? Authorizing Provider  acetaminophen (TYLENOL) 325 MG tablet Take 2 tablets (650 mg total) by mouth every 6 (six) hours as needed for mild pain (or Fever >/= 101). 11/24/22   Pokhrel, Rebekah Chesterfield, MD  atorvastatin (LIPITOR) 40 MG tablet Take 40 mg by mouth daily.  Patient not taking: Reported on 11/09/2022 10/30/14   [provider]  b complex-vitamin c-folic acid (NEPHRO-VITE) 0.8 MG TABS tablet Take 1 tablet by mouth at bedtime.    [provider]  calcitRIOL (ROCALTROL) 0.25 MCG capsule Take 3 capsules (0.75 mcg total) by mouth every Monday, Wednesday, and Friday with hemodialysis. 11/25/22   Pokhrel, Rebekah Chesterfield, MD  cinacalcet (SENSIPAR) 30 MG tablet Take 30 mg by mouth daily. Patient not taking: Reported on 11/09/2022 08/25/22   [provider]  ELIQUIS 5 MG TABS tablet Take 1 tablet (5 mg total) by mouth 2 (two) times daily. Resume Your Eliquis after 5 days 09/23/22   Lanae Boast, MD  midodrine (PROAMATINE) 5 MG tablet Take 3 tablets (15 mg total) by mouth in the morning, at noon, and at bedtime. Prior to dialysis M-W-F 11/24/22   Pokhrel, Rebekah Chesterfield, MD  Olopatadine HCl (PATADAY) 0.2 % SOLN Place 1 drop into both eyes daily.    [provider]  pantoprazole (PROTONIX) 40 MG tablet Take 1 tablet (40 mg total) by mouth 2 (two) times daily before a meal. 11/24/22   Pokhrel, Rebekah Chesterfield, MD  pregabalin (LYRICA) 75 MG capsule Take 1 capsule (75 mg total) by mouth at bedtime. 11/24/22 11/24/23  Pokhrel, Rebekah Chesterfield, MD  traZODone (DESYREL) 50 MG tablet Take 1 tablet (50 mg total) by mouth at bedtime as needed for sleep. 11/24/22   Pokhrel, Rebekah Chesterfield, MD  VELPHORO 500 MG chewable tablet Chew 500 mg by mouth 3 (three) times daily with  meals. 06/03/22   [provider]    Current Facility-Administered Medications  Medication Dose Route Frequency Provider Last Rate Last Admin   acetaminophen (TYLENOL) tablet 650 mg  650 mg Per Tube Q6H PRN Paliwal, Aditya, MD   650 mg at 12/15/22 0605   Chlorhexidine Gluconate Cloth 2 % PADS 6 each  6 each Topical Q0600 Terrial Rhodes, MD   6 each at 12/15/22 0553   docusate sodium (COLACE) capsule 100 mg  100 mg Oral BID PRN Bowser, Kaylyn Layer, NP       lidocaine (PF) (XYLOCAINE) 1 % injection 5 mL  5 mL Intradermal PRN Terrial Rhodes, MD       lidocaine-prilocaine (EMLA)  cream 1 Application  1 Application Topical PRN Terrial Rhodes, MD       midodrine (PROAMATINE) tablet 10 mg  10 mg Oral TID WC Selmer Dominion B, NP   10 mg at 12/15/22 1808   norepinephrine (LEVOPHED) 4mg  in (0.016 mg/mL) premix infusion  0-40 mcg/min Intravenous Continuous Pollina, Canary Brim, MD 7.5 mL/hr at 12/15/22 1800 2 mcg/min at 12/15/22 1800   pantoprazole (PROTONIX) injection 40 mg  40 mg Intravenous Q12H Lanier Clam, NP   40 mg at 12/15/22 1610   pentafluoroprop-tetrafluoroeth (GEBAUERS) aerosol 1 Application  1 Application Topical PRN Terrial Rhodes, MD       polyethylene glycol (MIRALAX / GLYCOLAX) packet 17 g  17 g Oral Daily PRN Bowser, Kaylyn Layer, NP       pregabalin (LYRICA) capsule 75 mg  75 mg Oral Daily Selmer Dominion B, NP   75 mg at 12/15/22 1137    Allergies as of 12/13/2022   (No Known Allergies)     Review of Systems:    This is positive for those things mentioned in the HPI otherwise negative      Physical Exam:  Vital signs in last 24 hours: Temp:  [97.7 F (36.5 C)-98.5 F (36.9 C)] 98.1 F (36.7 C) (06/04 1500) Pulse Rate:  [106-138] 111 (06/04 1800) Resp:  [9-22] 13 (06/04 1800) BP: (67-135)/(20-94) 98/68 (06/04 1800) SpO2:  [86 %-100 %] 100 % (06/04 1800) Arterial Line BP: (90-138)/(56-76) 131/68 (06/04 1200) Weight:  [85.2 kg] 85.2 kg (06/04  0500) Last BM Date : 12/15/22  General:  Chronically ill elderly black man and in no acute distress Eyes:  anicteric. Lungs: Clear to auscultation bilaterally. Heart:   S1S2, with systolic ejection murmur Abdomen:  soft, non-tender, no mass and BS+.  Skin   no rash. Neuro:  A&O x 3.  Psych:  appropriate mood and  Affect.   Data Reviewed:   LAB RESULTS: Recent Labs    12/15/22 0831 12/15/22 1140 12/15/22 1643 12/15/22 1813  WBC 12.2* 11.1* 10.6*  --   HGB 9.8* 9.6* 9.6* 7.8*  HCT 29.2* 29.0* 28.7* 24.1*  PLT 122* 115* 122*  --    BMET Recent Labs    12/14/22 0316 12/14/22 1746 12/15/22 0500 12/15/22 1140  NA 137  --  136 134*  K 3.2* 3.6 3.4* 3.4*  CL 99  --  99 99  CO2 27  --  26 27  GLUCOSE 115*  --  103* 78  BUN 22  --  19 20  CREATININE 6.46*  --  5.16* 5.59*  CALCIUM 7.8*  --  8.5* 8.5*   LFT Recent Labs    12/13/22 2340 12/15/22 0500  PROT 6.0*  --   ALBUMIN 1.9* 2.5*  AST 30  --   ALT 15  --   ALKPHOS 62  --   BILITOT 0.8  --    PT/INR Recent Labs    12/14/22 1815 12/14/22 2340  LABPROT 18.4* 17.4*  INR 1.5* 1.4*    STUDIES: CT ANGIO GI BLEED  Result Date: 12/14/2022 CLINICAL DATA:  77 year old with rectal bleeding. EXAM: CTA ABDOMEN AND PELVIS WITHOUT AND WITH CONTRAST TECHNIQUE: Multidetector CT imaging of the abdomen and pelvis was performed using the standard protocol during bolus administration of intravenous contrast. Multiplanar reconstructed images and MIPs were obtained and reviewed to evaluate the vascular anatomy. RADIATION DOSE REDUCTION: This exam was performed according to the departmental dose-optimization program which includes automated exposure control, adjustment  of the mA and/or kV according to patient size and/or use of iterative reconstruction technique. CONTRAST:  OMNIPAQUE IOHEXOL 350 MG/ML SOLN COMPARISON:  09/10/2022 FINDINGS: VASCULAR Aorta: Atherosclerotic disease in the abdominal aorta without aneurysm,  dissection or significant stenosis. Celiac: Patent without evidence of aneurysm, dissection, vasculitis or significant stenosis. SMA: Patent without evidence of aneurysm, dissection, vasculitis or significant stenosis. Renals: Atherosclerotic disease involving bilateral renal arteries. There is at least mild stenosis involving the right renal artery but limited evaluation. No evidence for aneurysm or dissections involving the renal arteries. IMA: Patent Inflow: Atherosclerotic disease involving the bilateral iliac arteries without aneurysm, dissection or significant stenosis. Proximal Outflow: Proximal femoral arteries are patent bilaterally. Veins: Contrast in the IVC, right hepatic vein and renal veins related to the injection being performed through the right femoral central line. There is a right common femoral central line with the tip in the right external iliac vein. There also appears to be contrast in the right internal iliac veins. Review of the MIP images confirms the above findings. NON-VASCULAR Lower chest: Trace right pleural fluid and small left pleural effusion. Compressive atelectasis at both lung bases. Coronary arteries are heavily calcified. Compressive atelectasis at both lung bases. Hepatobiliary: Cholecystostomy tube is positioned in the gallbladder. Gallbladder is decompressed. Mild intrahepatic biliary dilatation. No significant extrahepatic biliary dilatation. Pancreas: Unremarkable. No pancreatic ductal dilatation or surrounding inflammatory changes. Spleen: Normal in size without focal abnormality. Adrenals/Urinary Tract: Adrenal glands are within normal limits. Chronic atrophy involving both kidneys with mild perinephric edema. 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 No hydronephrosis. Urinary bladder is decompressed. Stomach/Bowel: Probable small diverticulum along the posterior aspect of the  gastric fundus. No evidence for active GI bleeding. Normal appendix. Small metallic clip in the colon near the hepatic flexure on image 49/11. Moderate amount of stool in the colon. Mild distention of the rectum containing high-density material which could be related to blood products. Perirectal and presacral edema. No evidence for acute bowel inflammation or obstruction. Lymphatic: No lymph node enlargement in the abdomen or pelvis. Reproductive: Prostate is enlarged with fiducial markers around and involving the prostate. Other: Mild subcutaneous edema. Presacral and perirectal edema without free fluid. Left inguinal hernia containing fat. Negative for free air. Musculoskeletal: Diffuse sclerosis in the bones compatible with end-stage renal disease. Bilateral pars defects at L5 without significant anterolisthesis of L5 on S1. Irregularity of the endplates at L3-L4 is similar to the recent comparison examination. Findings could represent extensive degenerative changes with Schmorl's nodes. Multilevel degenerative facet disease most prominent at L3-L4. IMPRESSION: 1. Mild distention of the rectum containing high-density material which could be related to blood products. No evidence for active GI bleeding. 2. Cholecystostomy tube is positioned in the gallbladder. Gallbladder is decompressed. 3. Small left pleural effusion with compressive atelectasis at both lung bases. 4. Mild subcutaneous edema with presacral and perirectal edema which could be related to fluid overload. 5. Diffuse sclerosis in the bones compatible with end-stage renal disease. 6. Aortic Atherosclerosis (ICD10-I70.0). Electronically Signed   By: Richarda Overlie M.D.   On: 12/14/2022 08:45        Thanks   LOS: 1 day   @Turon Kilmer  Sena Slate, MD, [facility] @  12/15/2022, 7:16 PM

## 2022-12-15 NOTE — TOC Initial Note (Signed)
Transition of Care [facility]) - Initial/Assessment Note    Patient Details  Name: Justin [name] MRN: [id number] Date of Birth: [birth year]  Transition of Care [facility]) CM/SW Contact:    Ralene Bathe, LCSW Phone Number: 12/15/2022, 12:34 PM  Clinical Narrative:                 LCSW met with the patient and patient's niece, Rolly Pancake, at bedside.  The family reports that the patient was at [facility] (SNF) prior to admission, but plan to discharge home.  The patient will be living with his niece at [address] [place], [place] [address].  The niece reports that the patient has had home health services previously, but could not remember the name of the agency.  The patient currently has a hospital bed, wheelchair, and walker at the home.  The family did request a bedside commode.  The niece will be home with the patient 24/7.  The niece inquired about a cardiologist and primary care provider in [place].  LCSW informed the niece that the patient can be set up with follow up appointment with a clinic at discharge and that MD would have to make a referral to a cardiologist.   LCSW also spoke with the family about the process for applying for Medicaid and the niece will contact agencies to assist with transportation to dialysis.  TOC will continue to follow.    Expected Discharge Plan: Home w Home Health Services Barriers to Discharge: Continued Medical Work up   Patient Goals and CMS Choice Patient states their goals for this hospitalization and ongoing recovery are:: To return home          Expected Discharge Plan and Services In-house Referral: Clinical Social Work     Living arrangements for the past 2 months: Skilled Nursing Facility, Single Family Home                                      Prior Living Arrangements/Services Living arrangements for the past 2 months: Skilled Nursing Facility, Single Family Home Lives with:: Relatives Patient language and need for  interpreter reviewed:: Yes Do you feel safe going back to the place where you live?: Yes      Need for Family Participation in Patient Care: Yes (Comment) Care giver support system in place?: Yes (comment)   Criminal Activity/Legal Involvement Pertinent to Current Situation/Hospitalization: No - Comment as needed  Activities of Daily Living      Permission Sought/Granted   Permission granted to share information with : Yes, Verbal Permission Granted     Permission granted to share info w AGENCY: home health agencies, primary care providers        Emotional Assessment Appearance:: Appears stated age Attitude/Demeanor/Rapport: Engaged Affect (typically observed): Adaptable Orientation: : Oriented to Self, Oriented to Place, Oriented to  Time, Oriented to Situation Alcohol / Substance Use: Not Applicable Psych Involvement: No (comment)  Admission diagnosis:  End stage renal disease (HCC) [N18.6] Hemorrhagic shock (HCC) [R57.8] Gastrointestinal hemorrhage, unspecified gastrointestinal hemorrhage type [K92.2] Patient Active Problem List   Diagnosis Date Noted   Hemorrhagic shock (HCC) 12/14/2022   Acute encephalopathy 11/09/2022   Syncope 11/09/2022   Acute blood loss anemia 11/09/2022   Melena 11/09/2022   History of colonic polyps 11/09/2022   ESRD (end stage renal disease) on dialysis (HCC) 11/09/2022   Gastric nodule 11/09/2022   Rectal bleeding  09/17/2022   Colon ulcer 09/17/2022   Lower GI bleeding 09/17/2022   GI bleed 09/16/2022   Prolonged QT interval 09/16/2022   History of prostate cancer 09/16/2022   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   History of colon polyps 09/16/2022   Lower GI bleed 09/12/2022   Acute GI bleeding 09/10/2022   Wide-complex tachycardia 06/17/2022   Hypotension 06/13/2022   Obesity (BMI 30-39.9) 06/13/2022   Perirectal abscess 06/13/2022   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   Murmur, cardiac 01/27/2022   Chronic back pain 01/27/2022   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) 01/27/2022   GERD (gastroesophageal reflux disease) 11/11/2021   High risk medication use 11/11/2021   History of cardioversion 11/11/2021   Paroxysmal atrial fibrillation with RVR (HCC) 11/11/2021   PVC (premature ventricular contraction) 11/11/2021   Nonrheumatic aortic valve stenosis 11/06/2021   Mixed hyperlipidemia 11/06/2021   Abdominal mass 05/05/2021   First degree AV block 07/13/2020   Paroxysmal A-fib (HCC) 01/29/2020   Wound infection 01/28/2020   H/O vitrectomy 12/07/2019   Macular pucker, 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 determined by examination associated with type 2 diabetes mellitus (HCC) 10/24/2019   Stable treated proliferative diabetic retinopathy of left eye determined by examination associated with type 2 diabetes mellitus (HCC) 10/24/2019   ESRD on hemodialysis (HCC) 07/29/2017   Anticoagulated 07/13/2017   Malignant neoplasm of prostate (HCC) 07/12/2017   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 [place] [address] Phone: [phone] Fax: [phone]     Social Determinants of Health (SDOH) Social History: SDOH Screenings   Food Insecurity: No Food Insecurity (11/24/2022)  Housing: Low Risk  (11/24/2022)  Transportation Needs: No Transportation Needs (11/24/2022)  Utilities: Not At Risk (11/24/2022)  Tobacco Use: Medium Risk (12/13/2022)   SDOH Interventions:     Readmission Risk Interventions    11/14/2022   10:08 AM 09/18/2022    4:10 PM 06/17/2022    2:47  PM  Readmission Risk Prevention Plan  Transportation Screening Complete Complete Complete  HRI or Home Care Consult   Complete  Social Work Consult for Recovery Care Planning/Counseling   Complete  Palliative Care Screening   Not Applicable  Medication Review [employer]) Complete Complete Complete  PCP or Specialist appointment within 3-5 days of discharge Complete Complete   HRI or Home Care Consult Complete Complete   SW Recovery Care/Counseling Consult Complete    Palliative Care Screening Not Applicable Complete   Skilled Nursing Facility Complete Not Applicable

## 2022-12-16 DIAGNOSIS — I480 Paroxysmal atrial fibrillation: Secondary | ICD-10-CM | POA: Diagnosis not present

## 2022-12-16 DIAGNOSIS — K921 Melena: Secondary | ICD-10-CM | POA: Diagnosis not present

## 2022-12-16 DIAGNOSIS — R578 Other shock: Secondary | ICD-10-CM | POA: Diagnosis not present

## 2022-12-16 DIAGNOSIS — N186 End stage renal disease: Secondary | ICD-10-CM | POA: Diagnosis not present

## 2022-12-16 DIAGNOSIS — E119 Type 2 diabetes mellitus without complications: Secondary | ICD-10-CM

## 2022-12-16 DIAGNOSIS — Z992 Dependence on renal dialysis: Secondary | ICD-10-CM | POA: Diagnosis not present

## 2022-12-16 LAB — GLUCOSE, CAPILLARY
Glucose-Capillary: 126 mg/dL — ABNORMAL HIGH (ref 70–99)
Glucose-Capillary: 127 mg/dL — ABNORMAL HIGH (ref 70–99)
Glucose-Capillary: 140 mg/dL — ABNORMAL HIGH (ref 70–99)
Glucose-Capillary: 141 mg/dL — ABNORMAL HIGH (ref 70–99)
Glucose-Capillary: 146 mg/dL — ABNORMAL HIGH (ref 70–99)
Glucose-Capillary: 98 mg/dL (ref 70–99)

## 2022-12-16 LAB — BASIC METABOLIC PANEL
Anion gap: 10 (ref 5–15)
BUN: 27 mg/dL — ABNORMAL HIGH (ref 8–23)
CO2: 26 mmol/L (ref 22–32)
Calcium: 8.4 mg/dL — ABNORMAL LOW (ref 8.9–10.3)
Chloride: 99 mmol/L (ref 98–111)
Creatinine, Ser: 6.55 mg/dL — ABNORMAL HIGH (ref 0.61–1.24)
GFR, Estimated: 8 mL/min — ABNORMAL LOW (ref >60)
Glucose, Bld: 145 mg/dL — ABNORMAL HIGH (ref 70–99)
Potassium: 3.7 mmol/L (ref 3.5–5.1)
Sodium: 135 mmol/L (ref 135–145)

## 2022-12-16 LAB — CBC
HCT: 25.6 % — ABNORMAL LOW (ref 39.0–52.0)
Hemoglobin: 8.4 g/dL — ABNORMAL LOW (ref 13.0–17.0)
MCH: 30.7 pg (ref 26.0–34.0)
MCHC: 32.8 g/dL (ref 30.0–36.0)
MCV: 93.4 fL (ref 80.0–100.0)
Platelets: 132 10*3/uL — ABNORMAL LOW (ref 150–400)
RBC: 2.74 MIL/uL — ABNORMAL LOW (ref 4.22–5.81)
RDW: 18.1 % — ABNORMAL HIGH (ref 11.5–15.5)
WBC: 12.5 10*3/uL — ABNORMAL HIGH (ref 4.0–10.5)
nRBC: 0 % (ref 0.0–0.2)

## 2022-12-16 LAB — HEMOGLOBIN
Hemoglobin: 8.5 g/dL — ABNORMAL LOW (ref 13.0–17.0)
Hemoglobin: 8.2 g/dL — ABNORMAL LOW (ref 13.0–17.0)
Hemoglobin: 8.2 g/dL — ABNORMAL LOW (ref 13.0–17.0)

## 2022-12-16 SURGERY — COLONOSCOPY WITH PROPOFOL
Anesthesia: Monitor Anesthesia Care

## 2022-12-16 MED ORDER — PEG 3350-KCL-NABCB-NACL-NASULF 236 G PO SOLR
4000.0000 mL | Freq: Once | ORAL | Status: AC
  Administered 2022-12-16: 4000 mL via ORAL
  Filled 2022-12-16 (×2): qty 4000

## 2022-12-16 MED ORDER — MIDODRINE HCL 5 MG PO TABS
10.0000 mg | ORAL_TABLET | Freq: Three times a day (TID) | ORAL | Status: DC
  Administered 2022-12-16 – 2022-12-17 (×4): 10 mg via ORAL
  Filled 2022-12-16 (×4): qty 2

## 2022-12-16 NOTE — Progress Notes (Signed)
Patient ID: Justin [name], male   DOB: 05-14-1946, 77 y.o.   MRN: [id number] S: Had more rectal bleeding yesterday with slight drop in Hgb. O:BP 106/71   Pulse (!) 113   Temp 98.5 F (36.9 C) (Axillary)   Resp 15   Ht 5\' 10"  (1.778 m)   Wt 84.9 kg   SpO2 97%   BMI 26.86 kg/m   Intake/Output Summary (Last 24 hours) at 12/16/2022 0916 Last data filed at 12/16/2022 0900 Gross per 24 hour  Intake 410.34 ml  Output 325 ml  Net 85.34 ml   Intake/Output: I/O last 3 completed shifts: In: 559.1 [P.O.:120; I.V.:330.2; IV Piggyback:108.9] Out: 1925 [Drains:325; Other:1600]  Intake/Output this shift:  Total I/O In: 45 [I.V.:45] Out: -  Weight change: -2.9 kg Gen: chronically ill-appearing CVS: tachy Resp: cta Abd: +BS, soft, NT/ND Ext: LUE avf +T/B  Recent Labs  Lab 12/13/22 2340 12/13/22 2353 12/14/22 0316 12/14/22 1746 12/15/22 0500 12/15/22 1140 12/16/22 0730  NA 137 140 137  --  136 134* 135  K 3.5 4.4 3.2* 3.6 3.4* 3.4* 3.7  CL 98 97* 99  --  99 99 99  CO2 30  --  27  --  26 27 26   GLUCOSE 103* 101* 115*  --  103* 78 145*  BUN 23 36* 22  --  19 20 27*  CREATININE 6.76* 7.00* 6.46*  --  5.16* 5.59* 6.55*  ALBUMIN 1.9*  --   --   --  2.5*  --   --   CALCIUM 8.2*  --  7.8*  --  8.5* 8.5* 8.4*  PHOS  --   --   --   --  2.3*  --   --   AST 30  --   --   --   --   --   --   ALT 15  --   --   --   --   --   --    Liver Function Tests: Recent Labs  Lab 12/13/22 2340 12/15/22 0500  AST 30  --   ALT 15  --   ALKPHOS 62  --   BILITOT 0.8  --   PROT 6.0*  --   ALBUMIN 1.9* 2.5*   No results for input(s): "LIPASE", "AMYLASE" in the last 168 hours. No results for input(s): "AMMONIA" in the last 168 hours. CBC: Recent Labs  Lab 12/14/22 2340 12/15/22 0831 12/15/22 1140 12/15/22 1643 12/15/22 1813 12/15/22 2137 12/16/22 0517 12/16/22 0730  WBC 11.8* 12.2* 11.1* 10.6*  --  11.1*  --   --   HGB 10.8* 9.8* 9.6* 9.6* 7.8* 8.7* 8.5* 8.2*  HCT 32.3* 29.2* 29.0* 28.7*  24.1* 27.0*  --   --   MCV 92.3 92.7 91.2 94.4  --  93.1  --   --   PLT 129* 122* 115* 122*  --  125*  --   --    Cardiac Enzymes: No results for input(s): "CKTOTAL", "CKMB", "CKMBINDEX", "TROPONINI" in the last 168 hours. CBG: Recent Labs  Lab 12/15/22 1536 12/15/22 1946 12/15/22 2350 12/16/22 0353 12/16/22 0746  GLUCAP 100* 154* 126* 141* 140*    Iron Studies: No results for input(s): "IRON", "TIBC", "TRANSFERRIN", "FERRITIN" in the last 72 hours. Studies/Results: No results found.  Chlorhexidine Gluconate Cloth  6 each Topical Q0600   midodrine  10 mg Oral Q8H   pantoprazole (PROTONIX) IV  40 mg Intravenous Q12H   polyethylene glycol  4,000 mL Oral  Once   pregabalin  75 mg Oral Daily    BMET    Component Value Date/Time   NA 135 12/16/2022 0730   K 3.7 12/16/2022 0730   CL 99 12/16/2022 0730   CO2 26 12/16/2022 0730   GLUCOSE 145 (H) 12/16/2022 0730   BUN 27 (H) 12/16/2022 0730   CREATININE 6.55 (H) 12/16/2022 0730   CALCIUM 8.4 (L) 12/16/2022 0730   GFRNONAA 8 (L) 12/16/2022 0730   GFRAA 8 (L) 02/01/2020 0424   CBC    Component Value Date/Time   WBC 11.1 (H) 12/15/2022 2137   RBC 2.90 (L) 12/15/2022 2137   HGB 8.2 (L) 12/16/2022 0730   HCT 27.0 (L) 12/15/2022 2137   PLT 125 (L) 12/15/2022 2137   MCV 93.1 12/15/2022 2137   MCH 30.0 12/15/2022 2137   MCHC 32.2 12/15/2022 2137   RDW 18.0 (H) 12/15/2022 2137   LYMPHSABS 0.9 11/23/2022 0137   MONOABS 1.1 (H) 11/23/2022 0137   EOSABS 0.1 11/23/2022 0137   BASOSABS 0.1 11/23/2022 0137    Dialysis Orders: [facility] HD: [facility] M[facility] 3h  450/600  99kg  2/2.5 bath  LUA AVF  Hep none - last HD 4/26, post wt 111kg (lowered during hospitalization last month to 96kg) - rocaltrol 0.75 mcg po tiw - venofer 100mg  tiw thru 5/08 - mircera 200 mcg IV q 2 wks, last 4/26 - last Hb 7.7 on 4/24   Assessment/Plan:  Hemorrhagic shock - pressors to keep MAP >65.  He has received 3 units PRBC's.  CT angio negative for active  bleed.  Hold eliquis for now.  Transfuse as needed.  Agree with midodrine and titrate norepi as able.  Had another episode of rectal bleeding yesterday, seen by GI and to have EGD this morning.   ESRD -  Tolerated HD with pressor support yesterday.  Plan for HD today after EGD to keep on schedule.  Would like to have HD in ICU again just incase pressor support is required.    Hypertension/volume  -  currently below his edw, however did receive large amount of blood products.  Was able to UF 1.6 L yesterday.  Will plan for HD at bedside tomorrow and use pressors prn for MAP >65.  Wean pressors as able.  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  S/p blood transfusions x 3.  Follow H/H and transfuse prn.  Metabolic bone disease -  continue with home meds  Nutrition -  renal diet, carb modified.  A fib/flutter - would not resume eliquis at this time as risks outweigh benefits.    Acute cholecystitis s/p percutaneous cholecystostomy tube - from OSH 4/2-4/23/24 and felt to be too high risk for surgical intervention.  Drain exchanged 12/01/22 and f/u with surgery at [facility] in July.  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  Currently DNR.  May benefit from ongoing discussions with palliative care for possible transition to comfort if he were to deteriorate further  Irena Cords, MD [facility]

## 2022-12-16 NOTE — H&P (View-Only) (Signed)
Progress Note  Primary GI: Dr. Leonides Schanz  LOS: 2 days   Chief Complaint:Rectal bleeding   Subjective   Patient with family at bedside, wife. Provided some of the history.   Patient had 2 bloody bowel movements overnight.  RN describes them as bright red clots in BM. Denies nausea/vomiting.  Denies abdominal pain.  Was planning to have EGD/colonoscopy today, however, prep did not arrive in time.    Objective   Vital signs in last 24 hours: Temp:  [97.5 F (36.4 C)-98.5 F (36.9 C)] 97.8 F (36.6 C) (06/05 1144) Pulse Rate:  [93-117] 113 (06/05 1100) Resp:  [11-20] 14 (06/05 1100) BP: (67-119)/(50-94) 111/71 (06/05 1100) SpO2:  [89 %-100 %] 100 % (06/05 1100) Arterial Line BP: (131)/(68) 131/68 (06/04 1200) Weight:  [84.9 kg] 84.9 kg (06/05 0500) Last BM Date : 12/15/22 Last BM recorded by nurses in past 5 days Stool Type: Type 7 (Liquid consistency with no solid pieces) (12/16/2022  8:00 AM)  General:   male in no acute distress Heart:  Regular rate and rhythm; no murmurs Pulm: Clear anteriorly; no wheezing Abdomen: soft, nondistended, normal bowel sounds in all quadrants. Nontender without guarding. No organomegaly appreciated. Extremities:  No edema Neurologic:  Alert and  oriented x4;  No focal deficits.  Psych:  Cooperative. Normal mood and affect.  Intake/Output from previous day: 06/04 0701 - 06/05 0700 In: 387.7 [P.O.:120; I.V.:217.7; IV Piggyback:50] Out: 325 [Drains:325] Intake/Output this shift: Total I/O In: 66.6 [I.V.:66.6] Out: -   Studies/Results: No results found.  Lab Results: Recent Labs    12/15/22 1643 12/15/22 1813 12/15/22 2137 12/16/22 0517 12/16/22 0730 12/16/22 0939  WBC 10.6*  --  11.1*  --   --  12.5*  HGB 9.6* 7.8* 8.7* 8.5* 8.2* 8.4*  HCT 28.7* 24.1* 27.0*  --   --  25.6*  PLT 122*  --  125*  --   --  132*   BMET Recent Labs    12/15/22 0500 12/15/22 1140 12/16/22 0730  NA 136 134* 135  K 3.4* 3.4* 3.7  CL 99 99 99   CO2 26 27 26   GLUCOSE 103* 78 145*  BUN 19 20 27*  CREATININE 5.16* 5.59* 6.55*  CALCIUM 8.5* 8.5* 8.4*   LFT Recent Labs    12/13/22 2340 12/15/22 0500  PROT 6.0*  --   ALBUMIN 1.9* 2.5*  AST 30  --   ALT 15  --   ALKPHOS 62  --   BILITOT 0.8  --    PT/INR Recent Labs    12/14/22 1815 12/14/22 2340  LABPROT 18.4* 17.4*  INR 1.5* 1.4*     Scheduled Meds:  Chlorhexidine Gluconate Cloth  6 each Topical Q0600   midodrine  10 mg Oral Q8H   pantoprazole (PROTONIX) IV  40 mg Intravenous Q12H   polyethylene glycol  4,000 mL Oral Once   pregabalin  75 mg Oral Daily   Continuous Infusions:  norepinephrine (LEVOPHED) Adult infusion 5 mcg/min (12/16/22 1000)      Patient profile:   77 year old male with complex medical history, to include recent hospital admissions for rectal abscess/septic shock in 06/2022, LGIB x 2 in February and March (resected adenomas, treated rectal AVMs/radiation proctitis with APC, post polypectomy bleed with subsequent clipping), and most recently admission to [facility] early April for acute cholecystitis requiring PERC drain in the setting of NSTEMI.  Repeat EGD at that time with no active bleeding. Presented for painless hematochezia  Previous GI workup -06/15/22 s/p I&D of the abscess in December. Cultures +MDR E.coli. Completed several weeks of antibiotics. D/C to SNF for rehab.  - 09/10/22 CTA (-)active bleed. (+)distended GB with stones vs sludge.  --09/11/22 EGD "small stomach nodule," 2 duodenal polyps. No biopsies and none removed due to concern for acute GIB. Colonoscopy 15mm polyp in cecum. 10mm polyp in transverse colon s/p removal. Path +tubular adenoma. Rectal AVMs/radiation proctitis s/p APC thought to be source of bleed. Plans were made for repeat EGD after outpatient follow up with GI. Advised to resume Eliquis after D/C. --09/17/22 colonoscopy recurrent bleeding of rectal AVMs s/p APC. Stigmata of bleeding from prior polyp removal  s/p clips. Advised to resume Eliquis 5 days after D/C.  -- 11/11/22 VCE: possible tiny gastric AVM versus mild localized gastritis versus healing from previous gastric biopsy site.  No bleeding.  Otherwise negative study without any blood noted through the small bowel   Impression:   Painless hematochezia Hemorrhagic shock -Hgb 8.4, s/p 2 units PRBCs and 1 unit FFP -WBC 12.5 -Platelets 132 -BUN elevated at 27 in the setting of ESRD on HD Originally planned to do EGD/colonoscopy today, however, due to not getting prep in time we will reschedule procedures until tomorrow.  Hemoglobin currently stable with no active bleeding since around 5 to 6 AM.  Paroxysmal A-fib -Eliquis stopped 6/4 and treated with Kcentra -hold anticoagulation, follow up outpatient with cardiology to discuss resuming in the setting of recurrent bleeding  ESRD on HD -BUN 27, creatinine 6.55, GFR 8  DM type II    Plan:   Plan for EGD/Colonoscopy tomorrow. I thoroughly discussed the procedures to include nature, alternatives, benefits, and risks including but not limited to bleeding, perforation, infection, anesthesia/cardiac and pulmonary complications. Patient provides understanding and gave verbal consent to proceed. Continue Protonix 40 mg IV BID. Golytely prep, clear liquid diet, NPO at midnight. Continue daily CBC with transfusion as needed to maintain Hgb >7.   Hold Eliquis   Bayley Leanna Sato  12/16/2022, 11:44 AM   Attending physician's note   I have taken a history, reviewed the chart and examined the patient. I performed a substantive portion of this encounter, including complete performance of at least one of the key components, in conjunction with the APP. I agree with the APP's note, impression and recommendations.    He did not have any further episodes of large-volume hematochezia, he had a small bowel movement with likely residual blood.  Hemodynamic status has improved Plan to proceed with EGD  and colonoscopy tomorrow for further evaluation, differential for large-volume hematochezia is diverticular hemorrhage or bleeding from AVM/stercoral ulcer in colon.  But will need to exclude rapid upper GI bleed Continue PPI Bowel prep N.p.o. after midnight Monitor hemoglobin and transfuse as needed Continue to hold Eliquis   The patient was provided an opportunity to ask questions and all were answered. The patient agreed with the plan and demonstrated an understanding of the instructions.   Iona Beard , MD [phone]

## 2022-12-16 NOTE — Evaluation (Signed)
Physical Therapy Evaluation Patient Details Name: Justin [name] MRN: [id number] DOB: Oct 26, 1945 Today's Date: 12/16/2022  History of Present Illness  Patient is a 77 y/o male admitted 12/14/22 with recurrent GIB, h/o a-fib on Eliquis, rectal AVM, ESRD on HD, chronic hypotension, mod AS, Prostate CA, DM, retinopathy, peripheral neuropathy, gout, HTN, admission 4/2-4/23 for cholecystitis, septic shock, NSTEMI, placed cholecystostomy tube, admission 5/1-5/14 for lower GIB/PNA.  Clinical Impression  Patient with limited evaluation due to dropping BP and episode of hematochezia during session.  Completed extremity assessment then RN preferred to wait for BP to improve with meds prior to rolling for hygiene.  Patient presents with decreased mobility due to weakness, pain, limited activity tolerance and will benefit from skilled PT in the acute setting.  Recommend return to inpatient rehab < 3 hours/day prior to d/c home.        Recommendations for follow up therapy are one component of a multi-disciplinary discharge planning process, led by the attending physician.  Recommendations may be updated based on patient status, additional functional criteria and insurance authorization.  Follow Up Recommendations Can patient physically be transported by private vehicle: No     Assistance Recommended at Discharge Frequent or constant Supervision/Assistance  Patient can return home with the following  Two people to help with walking and/or transfers;Two people to help with bathing/dressing/bathroom    Equipment Recommendations Other (comment) (TBA)  Recommendations for Other Services       Functional Status Assessment Patient has had a recent decline in their functional status and demonstrates the ability to make significant improvements in function in a reasonable and predictable amount of time.     Precautions / Restrictions Precautions Precautions: Fall;Other (comment) Precaution Comments: Pressure  sores B heels, hypersensitivity/pain B feet; watch BP, rectal tube with BRBPR      Mobility  Bed Mobility               General bed mobility comments: deferred due to pt with BP dropping and episode of BRBPR during session, RN encouraged to wait for BP To rise even to roll for hygiene    Transfers                        Ambulation/Gait                  Stairs            Wheelchair Mobility    Modified Rankin (Stroke Patients Only)       Balance                                             Pertinent Vitals/Pain Pain Assessment Pain Assessment: Faces Faces Pain Scale: Hurts whole lot Pain Location: bottoms of his feet Pain Descriptors / Indicators: Tender Pain Intervention(s): Monitored during session, Limited activity within patient's tolerance    Home Living Family/patient expects to be discharged to:: Skilled nursing facility                   Additional Comments: Pt lived at home alone prior to 06/2022 hospitalization. Since that time he has either been at [facility] or additional hospitalizations.    Prior Function Prior Level of Function : Needs assist             Mobility Comments: Prior to 06/2022 hospitalization, pt lived  alone mod I with SPC vs RW. Still driving. Pt has not stood on his feet in at least 3 months.       Hand Dominance   Dominant Hand: Right    Extremity/Trunk Assessment   Upper Extremity Assessment Upper Extremity Assessment: Generalized weakness    Lower Extremity Assessment Lower Extremity Assessment: Generalized weakness RLE Deficits / Details: hypersensitivity bilat feet due to neuropathy, pressure sore at heel AROM WFL, strength grossly 3+/5 RLE Sensation: history of peripheral neuropathy LLE Deficits / Details: hypersensitivity bilat feet due to neuropathy, pressure sore at heel AROM WFL, strength grossly 2+/5 LLE Sensation: history of peripheral neuropathy        Communication   Communication: No difficulties  Cognition Arousal/Alertness: Awake/alert Behavior During Therapy: WFL for tasks assessed/performed Overall Cognitive Status: Within Functional Limits for tasks assessed                                 General Comments: Family in the room and pt conversing initially        General Comments General comments (skin integrity, edema, etc.): MAP 58 then 51 and RN in to increased precedex    Exercises     Assessment/Plan    PT Assessment Patient needs continued PT services  PT Problem List Decreased strength;Decreased balance;Pain;Decreased mobility;Decreased activity tolerance       PT Treatment Interventions Functional mobility training;DME instruction;Therapeutic activities;Therapeutic exercise;Balance training;Patient/family education    PT Goals (Current goals can be found in the Care Plan section)  Acute Rehab PT Goals Patient Stated Goal: To get stronger PT Goal Formulation: With patient Time For Goal Achievement: 12/30/22 Potential to Achieve Goals: Fair    Frequency Min 2X/week     Co-evaluation               AM-PAC PT "6 Clicks" Mobility  Outcome Measure Help needed turning from your back to your side while in a flat bed without using bedrails?: Total Help needed moving from lying on your back to sitting on the side of a flat bed without using bedrails?: Total Help needed moving to and from a bed to a chair (including a wheelchair)?: Total Help needed standing up from a chair using your arms (e.g., wheelchair or bedside chair)?: Total Help needed to walk in hospital room?: Total Help needed climbing 3-5 steps with a railing? : Total 6 Click Score: 6    End of Session   Activity Tolerance: Treatment limited secondary to medical complications (Comment) Patient left: in bed   PT Visit Diagnosis: Other abnormalities of gait and mobility (R26.89);Muscle weakness (generalized) (M62.81)    Time:  1245-1300 PT Time Calculation (min) (ACUTE ONLY): 15 min   Charges:   PT Evaluation $PT Eval Moderate Complexity: 1 Mod          Justin [name], PT Acute Rehabilitation Services Office:[phone] 12/16/2022   Justin [name] 12/16/2022, 4:20 PM

## 2022-12-16 NOTE — Progress Notes (Signed)
NAME:  Justin [name], MRN:  [id number], DOB:  01/27/1946, LOS: 2 ADMISSION DATE:  12/13/2022, CONSULTATION DATE:  12/14/22 REFERRING MD:  Blinda Leatherwood , CHIEF COMPLAINT:  GIB   History of Present Illness:  77 yo male with hx of A fib on eliquis and rectal AVM with recurrent GI bleeding presented from NH with rectal bleeding.  Developed hypotension.  Received PRBC and Kcentra.  Started on pressors.  PCCM asked to admit to ICU.  Pertinent  Medical History  ESRD, A fib, Rectal AVM, Chronic hypotension, Moderate aortic stenosis, Prostate cancer, DM type 2, DM retinopathy and peripheral neuropathy, Gout, HTN  Significant Hospital Events: Including procedures, antibiotic start and stop dates in addition to other pertinent events   3/07 Colonoscopy >> cecal polyp not removed, solitary ulcer in proximal transverse colon from prior polypectomy 3 clips placed, solitary ulcer mid transverse colon 2 clips placed, few polyps in transverse colon, diverticulosis in transverse colon and Lt colon, blood in rectum and recto-sigmoid colon, 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 6/03 progressive hem shock. 3 prbc, kcentra, CVC placed and on pressors. PCCM to admit; CT angio GI bleed negative for active bleeding, iHD overnight 6/4 intermittently on pressors, stools dark> to hematochezia, GI consult  Interim History / Subjective:  EGD this am w/ GI Ongoing hematochezia overnight, Hgb 9.6> 7.8> 8.7> 8.5 On NE 6 mcg Pt denies abd pain  Objective   Blood pressure 107/60, pulse (!) 113, temperature 97.6 F (36.4 C), temperature source Oral, resp. rate 18, height 5\' 10"  (1.778 m), weight 84.9 kg, SpO2 99 %.        Intake/Output Summary (Last 24 hours) at  12/16/2022 0732 Last data filed at 12/16/2022 4098 Gross per 24 hour  Intake 351.75 ml  Output 325 ml  Net 26.75 ml   Filed Weights   12/14/22 1800 12/15/22 0500 12/16/22 0500  Weight: 88.1 kg 85.2 kg 84.9 kg    Examination: General:  chronically ill appearing elderly male lying in bed in NAD HEENT: MM pale/moist Neuro:  lethargic, awakens to touch, verbal to name, f/c, MAE> pt's normal routine is awake at night, sleep during the day CV: rr, ST, +murmur, LUE AVF +b/t, R femoral CVL PULM:  non labored, CTA, 2L [place] GI: soft, bs+, NT, ND, rectal pouch in place w/ hematochezia, RUQ perc chole drain with bilious output Extremities: warm/dry, no LE> limited exam of LE due to chronic neuropathy pain Skin: no rashes   Pending BMET and full CBC, Hgb 8.5 Afebrile Net -313 ml  Resolved Hospital Problem list     Assessment & Plan:   Acute lower GI bleeding with hx of radiation proctitis, distal rectum angiodysplastic lesion, and rectal AVM. - followed by Dr. Leonides Schanz outpt with [facility] - CT angio abd/pelvis from 6/03 negative for active bleeding - GI consulted 6/4 PM given melena> to hematochezia and worsening hypotension - EGD w/ GI this am - remains NPO - cont PPI BID  Hemorrhagic shock. Acute blood loss anemia 2nd to GI bleeding. - trending H/H q 6, transfuse for Hgb < 7 (baseline Hgb ~8) or for significant hematochezia/ hemodynamic instability - EGD as above today - BP normally lower for pt when sleeping, cont NE for MAP goal  60-65 - cont midodrine 10mg  TID  A fib/flutter on eliquis. Aortic stenosis. Chronic hypotension on midodrine with HD. - Kcentra on 6/03 - monitor on telemetry, remains ST - cont to hold eliquis given recurrent GIBs while inpt and possibly indefinitely.  Can be determined outpt f/u with GI/ cardiology - check Mag, goal > 2 - fluid removal w/iHD - BP management as above  ESRD. Hypokalemia. - per Nephrology, appreciate assistance.  iHD due today.   Pending BMET  Acute cholecystitis s/p percutaneous cholecystostomy tube. - was at [facility] from 10/13/22 to 11/03/22 >> felt to be too high risk for surgical intervention - drain put in on 10/16/22 and exchanged on 12/01/22 - monitor drain output - f/u with surgery at [facility] in July   DM type 2. - SSI, controlled  Peripheral diabetic neuropathy Immobility - cont lyrica - PT consulted.   Multiple pressure injuries, POA - cont WOC, frequent turns, nutrition   Best Practice (right click and "Reselect all SmartList Selections" daily)   Diet/type: Regular consistency (see orders)> NPO pending EGD DVT prophylaxis: SCD GI prophylaxis: PPI Lines: Central line Foley:  N/A Code Status:  DNR Last date of multidisciplinary goals of care discussion [daughter updated at bedside]  No family at bedside 6/5, pending.   Labs       Latest Ref Rng & Units 12/15/2022   11:40 AM 12/15/2022    5:00 AM 12/14/2022    5:46 PM  CMP  Glucose 70 - 99 mg/dL 78  130    BUN 8 - 23 mg/dL 20  19    Creatinine 8.65 - 1.24 mg/dL 7.84  6.96    Sodium 295 - 145 mmol/L 134  136    Potassium 3.5 - 5.1 mmol/L 3.4  3.4  3.6   Chloride 98 - 111 mmol/L 99  99    CO2 22 - 32 mmol/L 27  26    Calcium 8.9 - 10.3 mg/dL 8.5  8.5         Latest Ref Rng & Units 12/16/2022    5:17 AM 12/15/2022    9:37 PM 12/15/2022    6:13 PM  CBC  WBC 4.0 - 10.5 K/uL  11.1    Hemoglobin 13.0 - 17.0 g/dL 8.5  8.7  7.8   Hematocrit 39.0 - 52.0 %  27.0  24.1   Platelets 150 - 400 K/uL  125      CBG (last 3)  Recent Labs    12/15/22 1946 12/15/22 2350 12/16/22 0353  GLUCAP 154* 126* 141*   Critical care time: 33 minutes     Posey Boyer, MSN, AG-ACNP-BC [facility] 12/16/2022, 7:32 AM  See Amion for pager If no response to pager, please call PCCM consult pager After 7:00 pm call Elink

## 2022-12-16 NOTE — Progress Notes (Addendum)
  Progress Note  Primary GI: Dr. Dorsey  LOS: 2 days   Chief Complaint:Rectal bleeding   Subjective   Patient with family at bedside, wife. Provided some of the history.   Patient had 2 bloody bowel movements overnight.  RN describes them as bright red clots in BM. Denies nausea/vomiting.  Denies abdominal pain.  Was planning to have EGD/colonoscopy today, however, prep did not arrive in time.    Objective   Vital signs in last 24 hours: Temp:  [97.5 F (36.4 C)-98.5 F (36.9 C)] 97.8 F (36.6 C) (06/05 1144) Pulse Rate:  [93-117] 113 (06/05 1100) Resp:  [11-20] 14 (06/05 1100) BP: (67-119)/(50-94) 111/71 (06/05 1100) SpO2:  [89 %-100 %] 100 % (06/05 1100) Arterial Line BP: (131)/(68) 131/68 (06/04 1200) Weight:  [84.9 kg] 84.9 kg (06/05 0500) Last BM Date : 12/15/22 Last BM recorded by nurses in past 5 days Stool Type: Type 7 (Liquid consistency with no solid pieces) (12/16/2022  8:00 AM)  General:   male in no acute distress Heart:  Regular rate and rhythm; no murmurs Pulm: Clear anteriorly; no wheezing Abdomen: soft, nondistended, normal bowel sounds in all quadrants. Nontender without guarding. No organomegaly appreciated. Extremities:  No edema Neurologic:  Alert and  oriented x4;  No focal deficits.  Psych:  Cooperative. Normal mood and affect.  Intake/Output from previous day: 06/04 0701 - 06/05 0700 In: 387.7 [P.O.:120; I.V.:217.7; IV Piggyback:50] Out: 325 [Drains:325] Intake/Output this shift: Total I/O In: 66.6 [I.V.:66.6] Out: -   Studies/Results: No results found.  Lab Results: Recent Labs    12/15/22 1643 12/15/22 1813 12/15/22 2137 12/16/22 0517 12/16/22 0730 12/16/22 0939  WBC 10.6*  --  11.1*  --   --  12.5*  HGB 9.6* 7.8* 8.7* 8.5* 8.2* 8.4*  HCT 28.7* 24.1* 27.0*  --   --  25.6*  PLT 122*  --  125*  --   --  132*   BMET Recent Labs    12/15/22 0500 12/15/22 1140 12/16/22 0730  NA 136 134* 135  K 3.4* 3.4* 3.7  CL 99 99 99   CO2 26 27 26  GLUCOSE 103* 78 145*  BUN 19 20 27*  CREATININE 5.16* 5.59* 6.55*  CALCIUM 8.5* 8.5* 8.4*   LFT Recent Labs    12/13/22 2340 12/15/22 0500  PROT 6.0*  --   ALBUMIN 1.9* 2.5*  AST 30  --   ALT 15  --   ALKPHOS 62  --   BILITOT 0.8  --    PT/INR Recent Labs    12/14/22 1815 12/14/22 2340  LABPROT 18.4* 17.4*  INR 1.5* 1.4*     Scheduled Meds:  Chlorhexidine Gluconate Cloth  6 each Topical Q0600   midodrine  10 mg Oral Q8H   pantoprazole (PROTONIX) IV  40 mg Intravenous Q12H   polyethylene glycol  4,000 mL Oral Once   pregabalin  75 mg Oral Daily   Continuous Infusions:  norepinephrine (LEVOPHED) Adult infusion 5 mcg/min (12/16/22 1000)      Patient profile:   77-year-old male with complex medical history, to include recent hospital admissions for rectal abscess/septic shock in 06/2022, LGIB x 2 in February and March (resected adenomas, treated rectal AVMs/radiation proctitis with APC, post polypectomy bleed with subsequent clipping), and most recently admission to [facility] early April for acute cholecystitis requiring PERC drain in the setting of NSTEMI.  Repeat EGD at that time with no active bleeding. Presented for painless hematochezia     Previous GI workup -06/15/22 s/p I&D of the abscess in December. Cultures +MDR E.coli. Completed several weeks of antibiotics. D/C to SNF for rehab.  - 09/10/22 CTA (-)active bleed. (+)distended GB with stones vs sludge.  --09/11/22 EGD "small stomach nodule," 2 duodenal polyps. No biopsies and none removed due to concern for acute GIB. Colonoscopy 15mm polyp in cecum. 10mm polyp in transverse colon s/p removal. Path +tubular adenoma. Rectal AVMs/radiation proctitis s/p APC thought to be source of bleed. Plans were made for repeat EGD after outpatient follow up with GI. Advised to resume Eliquis after D/C. --09/17/22 colonoscopy recurrent bleeding of rectal AVMs s/p APC. Stigmata of bleeding from prior polyp removal  s/p clips. Advised to resume Eliquis 5 days after D/C.  -- 11/11/22 VCE: possible tiny gastric AVM versus mild localized gastritis versus healing from previous gastric biopsy site.  No bleeding.  Otherwise negative study without any blood noted through the small bowel   Impression:   Painless hematochezia Hemorrhagic shock -Hgb 8.4, s/p 2 units PRBCs and 1 unit FFP -WBC 12.5 -Platelets 132 -BUN elevated at 27 in the setting of ESRD on HD Originally planned to do EGD/colonoscopy today, however, due to not getting prep in time we will reschedule procedures until tomorrow.  Hemoglobin currently stable with no active bleeding since around 5 to 6 AM.  Paroxysmal A-fib -Eliquis stopped 6/4 and treated with Kcentra -hold anticoagulation, follow up outpatient with cardiology to discuss resuming in the setting of recurrent bleeding  ESRD on HD -BUN 27, creatinine 6.55, GFR 8  DM type II    Plan:   Plan for EGD/Colonoscopy tomorrow. I thoroughly discussed the procedures to include nature, alternatives, benefits, and risks including but not limited to bleeding, perforation, infection, anesthesia/cardiac and pulmonary complications. Patient provides understanding and gave verbal consent to proceed. Continue Protonix 40 mg IV BID. Golytely prep, clear liquid diet, NPO at midnight. Continue daily CBC with transfusion as needed to maintain Hgb >7.   Hold Eliquis   Bayley M McMichael  12/16/2022, 11:44 AM   Attending physician's note   I have taken a history, reviewed the chart and examined the patient. I performed a substantive portion of this encounter, including complete performance of at least one of the key components, in conjunction with the APP. I agree with the APP's note, impression and recommendations.    He did not have any further episodes of large-volume hematochezia, he had a small bowel movement with likely residual blood.  Hemodynamic status has improved Plan to proceed with EGD  and colonoscopy tomorrow for further evaluation, differential for large-volume hematochezia is diverticular hemorrhage or bleeding from AVM/stercoral ulcer in colon.  But will need to exclude rapid upper GI bleed Continue PPI Bowel prep N.p.o. after midnight Monitor hemoglobin and transfuse as needed Continue to hold Eliquis   The patient was provided an opportunity to ask questions and all were answered. The patient agreed with the plan and demonstrated an understanding of the instructions.   K. Veena Houa Nie , MD [phone]       

## 2022-12-17 ENCOUNTER — Encounter (HOSPITAL_COMMUNITY): Payer: Self-pay | Admitting: Certified Registered Nurse Anesthetist

## 2022-12-17 ENCOUNTER — Encounter (HOSPITAL_COMMUNITY): Payer: Self-pay | Admitting: Critical Care Medicine

## 2022-12-17 ENCOUNTER — Encounter (HOSPITAL_COMMUNITY)
Admission: EM | Disposition: A | Discharge status: Skilled Nursing Facility | Payer: Self-pay | Source: Skilled Nursing Facility | Attending: Family Medicine

## 2022-12-17 DIAGNOSIS — N186 End stage renal disease: Secondary | ICD-10-CM

## 2022-12-17 DIAGNOSIS — Z992 Dependence on renal dialysis: Secondary | ICD-10-CM

## 2022-12-17 DIAGNOSIS — D120 Benign neoplasm of cecum: Secondary | ICD-10-CM

## 2022-12-17 DIAGNOSIS — K921 Melena: Secondary | ICD-10-CM | POA: Diagnosis not present

## 2022-12-17 DIAGNOSIS — K626 Ulcer of anus and rectum: Secondary | ICD-10-CM | POA: Diagnosis not present

## 2022-12-17 DIAGNOSIS — R578 Other shock: Secondary | ICD-10-CM | POA: Diagnosis not present

## 2022-12-17 DIAGNOSIS — I864 Gastric varices: Secondary | ICD-10-CM

## 2022-12-17 DIAGNOSIS — K922 Gastrointestinal hemorrhage, unspecified: Secondary | ICD-10-CM | POA: Diagnosis not present

## 2022-12-17 HISTORY — PX: ESOPHAGOGASTRODUODENOSCOPY (EGD) WITH PROPOFOL: SHX5813

## 2022-12-17 HISTORY — PX: HEMOSTASIS CLIP PLACEMENT: SHX6857

## 2022-12-17 HISTORY — PX: COLONOSCOPY: SHX5424

## 2022-12-17 LAB — TYPE AND SCREEN
Antibody Screen: NEGATIVE
Unit division: 0
Unit division: 0
Unit division: 0

## 2022-12-17 LAB — CBC
HCT: 26.2 % — ABNORMAL LOW (ref 39.0–52.0)
Hemoglobin: 8.6 g/dL — ABNORMAL LOW (ref 13.0–17.0)
MCH: 29.9 pg (ref 26.0–34.0)
MCHC: 32.8 g/dL (ref 30.0–36.0)
MCV: 91 fL (ref 80.0–100.0)
Platelets: 128 10*3/uL — ABNORMAL LOW (ref 150–400)
RBC: 2.88 MIL/uL — ABNORMAL LOW (ref 4.22–5.81)
RDW: 17.9 % — ABNORMAL HIGH (ref 11.5–15.5)
WBC: 11.5 10*3/uL — ABNORMAL HIGH (ref 4.0–10.5)
nRBC: 0 % (ref 0.0–0.2)

## 2022-12-17 LAB — BASIC METABOLIC PANEL
Anion gap: 9 (ref 5–15)
BUN: 12 mg/dL (ref 8–23)
CO2: 27 mmol/L (ref 22–32)
Calcium: 7.8 mg/dL — ABNORMAL LOW (ref 8.9–10.3)
Chloride: 98 mmol/L (ref 98–111)
Creatinine, Ser: 3.26 mg/dL — ABNORMAL HIGH (ref 0.61–1.24)
GFR, Estimated: 19 mL/min — ABNORMAL LOW (ref >60)
Glucose, Bld: 106 mg/dL — ABNORMAL HIGH (ref 70–99)
Potassium: 3.1 mmol/L — ABNORMAL LOW (ref 3.5–5.1)
Sodium: 134 mmol/L — ABNORMAL LOW (ref 135–145)

## 2022-12-17 LAB — BPAM RBC
Blood Product Expiration Date: 202406212359
Blood Product Expiration Date: 202406222359
Blood Product Expiration Date: 202406252359
Blood Product Expiration Date: 202406292359
ISSUE DATE / TIME: 202406030155
Unit Type and Rh: 5100
Unit Type and Rh: 5100
Unit Type and Rh: 5100

## 2022-12-17 LAB — GLUCOSE, CAPILLARY
Glucose-Capillary: 84 mg/dL (ref 70–99)
Glucose-Capillary: 92 mg/dL (ref 70–99)
Glucose-Capillary: 95 mg/dL (ref 70–99)
Glucose-Capillary: 95 mg/dL (ref 70–99)

## 2022-12-17 LAB — MAGNESIUM: Magnesium: 1.7 mg/dL (ref 1.7–2.4)

## 2022-12-17 LAB — HEMOGLOBIN: Hemoglobin: 8.6 g/dL — ABNORMAL LOW (ref 13.0–17.0)

## 2022-12-17 SURGERY — ESOPHAGOGASTRODUODENOSCOPY (EGD) WITH PROPOFOL
Anesthesia: Monitor Anesthesia Care

## 2022-12-17 MED ORDER — DIPHENHYDRAMINE HCL 50 MG/ML IJ SOLN
INTRAMUSCULAR | Status: AC
  Filled 2022-12-17: qty 1

## 2022-12-17 MED ORDER — FENTANYL CITRATE (PF) 100 MCG/2ML IJ SOLN
INTRAMUSCULAR | Status: AC
  Filled 2022-12-17: qty 4

## 2022-12-17 MED ORDER — MIDAZOLAM HCL 5 MG/5ML IJ SOLN
INTRAMUSCULAR | Status: DC | PRN
  Administered 2022-12-17 (×2): 2 mg via INTRAVENOUS
  Administered 2022-12-17: 1 mg via INTRAVENOUS

## 2022-12-17 MED ORDER — POTASSIUM CHLORIDE 10 MEQ/50ML IV SOLN
10.0000 meq | INTRAVENOUS | Status: AC
  Administered 2022-12-17 (×2): 10 meq via INTRAVENOUS
  Filled 2022-12-17: qty 50

## 2022-12-17 MED ORDER — MAGNESIUM SULFATE 2 GM/50ML IV SOLN
2.0000 g | Freq: Once | INTRAVENOUS | Status: AC
  Administered 2022-12-17: 2 g via INTRAVENOUS
  Filled 2022-12-17: qty 50

## 2022-12-17 MED ORDER — DIPHENHYDRAMINE HCL 50 MG/ML IJ SOLN
INTRAMUSCULAR | Status: DC | PRN
  Administered 2022-12-17: 25 mg via INTRAVENOUS

## 2022-12-17 MED ORDER — MIDODRINE HCL 5 MG PO TABS
15.0000 mg | ORAL_TABLET | Freq: Three times a day (TID) | ORAL | Status: DC
  Administered 2022-12-17 – 2022-12-18 (×3): 15 mg via ORAL
  Filled 2022-12-17 (×3): qty 3

## 2022-12-17 MED ORDER — MIDAZOLAM HCL (PF) 5 MG/ML IJ SOLN
INTRAMUSCULAR | Status: AC
  Filled 2022-12-17: qty 2

## 2022-12-17 MED ORDER — FENTANYL CITRATE (PF) 100 MCG/2ML IJ SOLN
INTRAMUSCULAR | Status: DC | PRN
  Administered 2022-12-17: 25 ug via INTRAVENOUS
  Administered 2022-12-17 (×2): 12.5 ug via INTRAVENOUS

## 2022-12-17 MED ORDER — EPINEPHRINE 1 MG/10ML IJ SOSY
PREFILLED_SYRINGE | INTRAMUSCULAR | Status: AC
  Filled 2022-12-17: qty 10

## 2022-12-17 SURGICAL SUPPLY — 15 items

## 2022-12-17 NOTE — Interval H&P Note (Signed)
History and Physical Interval Note:  12/17/2022 1:16 PM  Justin [name]  has presented today for surgery, with the diagnosis of GI bleed.  The various methods of treatment have been discussed with the patient and family. After consideration of risks, benefits and other options for treatment, the patient has consented to  Procedure(s): ESOPHAGOGASTRODUODENOSCOPY (EGD) WITH PROPOFOL (N/A) COLONOSCOPY (N/A) as a surgical intervention.  The patient's history has been reviewed, patient examined, no change in status, stable for surgery.  I have reviewed the patient's chart and labs.  Questions were answered to the patient's satisfaction.     Toshika Parrow

## 2022-12-17 NOTE — Op Note (Addendum)
[facility] Patient Name: Justin [name] Procedure Date : 12/17/2022 MRN: [id number] Attending MD: Napoleon Form , MD, [id number] Date of Birth: 10/31/1945 CSN: [id number] Age: 77 Admit Type: Inpatient Procedure:                Upper GI endoscopy Indications:              Recent gastrointestinal bleeding, Suspected upper                            gastrointestinal bleeding, Melena Providers:                Napoleon Form, MD Referring MD:              Medicines:                Monitored Anesthesia Care Complications:            No immediate complications. Estimated Blood Loss:     Estimated blood loss was minimal. Procedure:                Pre-Anesthesia Assessment:                           - Prior to the procedure, a History and Physical                            was performed, and patient medications and                            allergies were reviewed. The patient's tolerance of                            previous anesthesia was also reviewed. The risks                            and benefits of the procedure and the sedation                            options and risks were discussed with the patient.                            All questions were answered, and informed consent                            was obtained. Prior Anticoagulants: The patient                            last took Eliquis (apixaban) 3 days prior to the                            procedure. ASA Grade Assessment: IV - A patient                            with severe systemic disease that is a constant  threat to life. After reviewing the risks and                            benefits, the patient was deemed in satisfactory                            condition to undergo the procedure.                           After obtaining informed consent, the endoscope was                            passed under direct vision. Throughout the                             procedure, the patient's blood pressure, pulse, and                            oxygen saturations were monitored continuously. The                            [id number]-H190 ([id number]) Olympus endoscope was introduced                            through the mouth, and advanced to the second part                            of duodenum. The upper GI endoscopy was                            accomplished without difficulty. The patient                            tolerated the procedure well. Scope In: Scope Out: 1:54:40 PM Findings:      The Z-line was regular and was found 38 cm from the incisors.      No gross lesions were noted in the entire esophagus.      A sub epithelial lesion concerning for possible varix with no bleeding       was found in the gastric fundus. It had stigmata of recent bleeding. It       was 40 mm in diameter. For location marking, one hemostatic clip was       successfully placed. There was no bleeding during, or at the end, of the       procedure.      The examined duodenum was normal. Impression:               - Z-line regular, 38 cm from the incisors.                           - No gross lesions in the entire esophagus.                           - Sub epithelial nodule/Gastric varices, without  bleeding. Clip was placed for marking.                           - Normal examined duodenum.                           - No specimens collected. Recommendation:           - Patient has a contact number available for                            emergencies. The signs and symptoms of potential                            delayed complications were discussed with the                            patient. Return to normal activities tomorrow.                            Written discharge instructions were provided to the                            patient.                           - Clear liquid diet.                           - Continue present medications.                            - Use Protonix (pantoprazole) 40 mg PO BID.                           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 All rights reserved. The codes documented in this report are preliminary and upon coder review may  be revised to meet current compliance requirements. Napoleon Form, MD 12/17/2022 2:51:05 PM This report has been signed electronically. Number of Addenda: 0

## 2022-12-17 NOTE — Progress Notes (Addendum)
Received patient in bed to unit. Mc02  Alert and oriented. yes Informed consent signed and in chart. yes  TX duration:3 hours 30 minutes  Patient tolerated well. yes Transported back to the room Done at bedside Alert, without acute distress. yes Hand-off given to patient's nurse. Rolla Etienne RN  Access used: left upper arm fistula Access issues: none  Total UF removed: Medication(s) given: 0 Post HD VS: 97/56 hr 113 Post HD weight: 83.5KG   [facility]

## 2022-12-17 NOTE — Progress Notes (Signed)
Post bedside endoscopy. VSS. Patient resting on left side, oxygenating well. Will continue to monitor.

## 2022-12-17 NOTE — Progress Notes (Signed)
Patient ID: Justin [name], male   DOB: Jul 03, 1946, 77 y.o.   MRN: [id number] S: EGD/colonoscopy not performed yesterday due to not getting prep in time but plan for EGD/colonoscopy today.  Able to UF 1.6L with HD yesterday. O:BP 116/63   Pulse (!) 116   Temp (!) 97.5 F (36.4 C) (Oral)   Resp 14   Ht 5\' 10"  (1.778 m)   Wt 84.9 kg   SpO2 100%   BMI 26.86 kg/m   Intake/Output Summary (Last 24 hours) at 12/17/2022 1023 Last data filed at 12/17/2022 0900 Gross per 24 hour  Intake 1568.95 ml  Output 0 ml  Net 1568.95 ml   Intake/Output: I/O last 3 completed shifts: In: 1651 [P.O.:1200; I.V.:451] Out: 175 [Drains:175]  Intake/Output this shift:  Total I/O In: 163.6 [I.V.:63.7; IV Piggyback:100] Out: -  Weight change: 0 kg WUJ:[id number] ill-appearing CVS: tachy Resp: CTA Abd: +BS, soft, NT/ND Ext:no edema, LUE AVF +T/B  Recent Labs  Lab 12/13/22 2340 12/13/22 2353 12/14/22 0316 12/14/22 1746 12/15/22 0500 12/15/22 1140 12/16/22 0730 12/17/22 0440  NA 137 140 137  --  136 134* 135 134*  K 3.5 4.4 3.2* 3.6 3.4* 3.4* 3.7 3.1*  CL 98 97* 99  --  99 99 99 98  CO2 30  --  27  --  26 27 26 27   GLUCOSE 103* 101* 115*  --  103* 78 145* 106*  BUN 23 36* 22  --  19 20 27* 12  CREATININE 6.76* 7.00* 6.46*  --  5.16* 5.59* 6.55* 3.26*  ALBUMIN 1.9*  --   --   --  2.5*  --   --   --   CALCIUM 8.2*  --  7.8*  --  8.5* 8.5* 8.4* 7.8*  PHOS  --   --   --   --  2.3*  --   --   --   AST 30  --   --   --   --   --   --   --   ALT 15  --   --   --   --   --   --   --    Liver Function Tests: Recent Labs  Lab 12/13/22 2340 12/15/22 0500  AST 30  --   ALT 15  --   ALKPHOS 62  --   BILITOT 0.8  --   PROT 6.0*  --   ALBUMIN 1.9* 2.5*   No results for input(s): "LIPASE", "AMYLASE" in the last 168 hours. No results for input(s): "AMMONIA" in the last 168 hours. CBC: Recent Labs  Lab 12/15/22 1140 12/15/22 1643 12/15/22 1813 12/15/22 2137 12/16/22 0517 12/16/22 0939  12/16/22 1433 12/17/22 0113 12/17/22 0440  WBC 11.1* 10.6*  --  11.1*  --  12.5*  --   --  11.5*  HGB 9.6* 9.6*   < > 8.7*   < > 8.4* 8.2* 8.6* 8.6*  HCT 29.0* 28.7*   < > 27.0*  --  25.6*  --   --  26.2*  MCV 91.2 94.4  --  93.1  --  93.4  --   --  91.0  PLT 115* 122*  --  125*  --  132*  --   --  128*   < > = values in this interval not displayed.   Cardiac Enzymes: No results for input(s): "CKTOTAL", "CKMB", "CKMBINDEX", "TROPONINI" in the last 168 hours. CBG: Recent Labs  Lab 12/16/22 1142 12/16/22  1541 12/16/22 1951 12/16/22 2347 12/17/22 0719  GLUCAP 127* 146* 98 95 84    Iron Studies: No results for input(s): "IRON", "TIBC", "TRANSFERRIN", "FERRITIN" in the last 72 hours. Studies/Results: No results found.  Chlorhexidine Gluconate Cloth  6 each Topical Q0600   midodrine  15 mg Oral Q8H   pantoprazole (PROTONIX) IV  40 mg Intravenous Q12H   pregabalin  75 mg Oral Daily    BMET    Component Value Date/Time   NA 134 (L) 12/17/2022 0440   K 3.1 (L) 12/17/2022 0440   CL 98 12/17/2022 0440   CO2 27 12/17/2022 0440   GLUCOSE 106 (H) 12/17/2022 0440   BUN 12 12/17/2022 0440   CREATININE 3.26 (H) 12/17/2022 0440   CALCIUM 7.8 (L) 12/17/2022 0440   GFRNONAA 19 (L) 12/17/2022 0440   GFRAA 8 (L) 02/01/2020 0424   CBC    Component Value Date/Time   WBC 11.5 (H) 12/17/2022 0440   RBC 2.88 (L) 12/17/2022 0440   HGB 8.6 (L) 12/17/2022 0440   HCT 26.2 (L) 12/17/2022 0440   PLT 128 (L) 12/17/2022 0440   MCV 91.0 12/17/2022 0440   MCH 29.9 12/17/2022 0440   MCHC 32.8 12/17/2022 0440   RDW 17.9 (H) 12/17/2022 0440   LYMPHSABS 0.9 11/23/2022 0137   MONOABS 1.1 (H) 11/23/2022 0137   EOSABS 0.1 11/23/2022 0137   BASOSABS 0.1 11/23/2022 0137    Dialysis Orders: OP HD: [facility] 3h  450/600  99kg  2/2.5 bath  LUA AVF  Hep none - last HD 4/26, post wt 111kg (lowered during hospitalization last month to 96kg) - rocaltrol 0.75 mcg po tiw - venofer 100mg  tiw thru  5/08 - mircera 200 mcg IV q 2 wks, last 4/26 - last Hb 7.7 on 4/24   Assessment/Plan:  Hemorrhagic shock - pressors to keep MAP >65.  He has received 3 units PRBC's.  CT angio negative for active bleed.  Hold eliquis for now.  Transfuse as needed.  Agree with midodrine and titrate norepi as able.  Had another episode of rectal bleeding and hematochezia on 12/15/22, seen by GI and to have EGD and colonoscopy this morning.   ESRD -  Tolerated HD with pressor support yesterday.  Plan for HD tomorrow to keep on schedule.  Would like to have HD in ICU again just incase pressor support is required.    Hypertension/volume  -  currently below his edw, however did receive large amount of blood products.  Was able to UF 1.6 L yesterday.  Will plan for HD at bedside tomorrow and use pressors prn for MAP >65.  Wean pressors as able.  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  S/p blood transfusions x 3.  Follow H/H and transfuse prn.  Metabolic bone disease -  continue with home meds  Nutrition -  renal diet, carb modified.  A fib/flutter - would not resume eliquis at this time as risks outweigh benefits.    Acute cholecystitis s/p percutaneous cholecystostomy tube - from OSH 4/2-4/23/24 and felt to be too high risk for surgical intervention.  Drain exchanged 12/01/22 and f/u with surgery at [facility] in July.  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  Currently DNR.  May benefit from ongoing discussions with palliative care for possible transition to comfort if he were to deteriorate further  Irena Cords, MD [facility]

## 2022-12-17 NOTE — Progress Notes (Signed)
NAME:  Justin [name], MRN:  [id number], DOB:  Aug 07, 1945, LOS: 3 ADMISSION DATE:  12/13/2022, CONSULTATION DATE:  12/14/22 REFERRING MD:  Blinda Leatherwood , CHIEF COMPLAINT:  GIB   History of Present Illness:  77 yo male with hx of A fib on eliquis and rectal AVM with recurrent GI bleeding presented from NH with rectal bleeding.  Developed hypotension.  Received PRBC and Kcentra.  Started on pressors.  PCCM asked to admit to ICU.  Pertinent  Medical History  ESRD, A fib, Rectal AVM, Chronic hypotension, Moderate aortic stenosis, Prostate cancer, DM type 2, DM retinopathy and peripheral neuropathy, Gout, HTN  Significant Hospital Events: Including procedures, antibiotic start and stop dates in addition to other pertinent events   3/07 Colonoscopy >> cecal polyp not removed, solitary ulcer in proximal transverse colon from prior polypectomy 3 clips placed, solitary ulcer mid transverse colon 2 clips placed, few polyps in transverse colon, diverticulosis in transverse colon and Lt colon, blood in rectum and recto-sigmoid colon, one active bleeding angiodysplastic lesion in distal rectum with few AVMs treated with argon plasma coagulation 4/02 to 4/23 Admit to [facility] for acute cholecystitis with septic shock and NSTEMI 4/28 to 5/14 Admit for lower GI bleed, PNA 6/02 to ED from [facility] with rectal bleeding. 6/03 progressive hem shock. 3 prbc, kcentra, CVC placed and on pressors. PCCM to admit; CT angio GI bleed negative for active bleeding, iHD overnight 6/4 intermittently on pressors, stools dark> to hematochezia, GI consult  Interim History / Subjective:  EGD and colonoscopy scheduled today, completed 3/4ths of bowel prep f/b enema this morning Hgb remains stable, no further bloody stools Ongoing low dose NE requirements, drops at night, comes off during day.   Pt normally is awake at night, sleeps during the day but has been sleeping very poorly  C/o of mild umbilical pain, denies nausea   iHD overnight, UF -1.4L  Objective   Blood pressure 116/63, pulse (!) 116, temperature (!) 97.5 F (36.4 C), temperature source Oral, resp. rate 14, height 5\' 10"  (1.778 m), weight 84.9 kg, SpO2 100 %.        Intake/Output Summary (Last 24 hours) at 12/17/2022 0945 Last data filed at 12/17/2022 0900 Gross per 24 hour  Intake 1590.62 ml  Output 0 ml  Net 1590.62 ml   Filed Weights   12/15/22 0500 12/16/22 0500 12/17/22 0442  Weight: 85.2 kg 84.9 kg 84.9 kg    Examination: General:  chronically ill appearing elderly male lying in bed in NAD HEENT: MM pink/moist Neuro: Awake, oriented x 3, MAE CV: rr, ST, R femoral CVL PULM:  non labored, clear, diminished in bases, on room air, at times needs 2L at night GI: soft, bs+, ND Extremities: warm/dry, no LE edema - still very sensitive to touch, LUE AVF +b/t Skin: no rashes   Afebrile Labs reviewed   Resolved Hospital Problem list     Assessment & Plan:   Acute lower GI bleeding with hx of radiation proctitis, distal rectum angiodysplastic lesion, and rectal AVM. - followed by Dr. Leonides Schanz outpt with [facility] - CT angio abd/pelvis from 6/03 negative for active bleeding - GI consulted 6/4 PM given melena> to hematochezia and worsening hypotension - pending EGD/ colonoscopy today - H/H remains stable, no further bloody stools - remains NPO pending procedures w/meds - cont PPI BID - await further recs per   Hemorrhagic shock. Acute blood loss anemia 2nd to GI bleeding. - H/H remains stable, no further bloody  stools, continue to monitor - workup as above - still requiring low dose NE at night, comes off during day w/ midodrine. 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 Aortic stenosis. Chronic hypotension on midodrine with HD. - Kcentra on 6/03 - tele monitoring - cont to hold eliquis given recurrent GIBs while inpt and possibly  indefinitely.  Can be determined outpt f/u with GI/ cardiology - check Mag, goal > 2 - fluid removal w/iHD - BP management as above  ESRD. Hypokalemia. - per Nephrology, appreciate assistance.  iHD MWF - s/p replete of electrolytes prn - received K and Mag this am  Acute cholecystitis s/p percutaneous cholecystostomy tube. - was at [facility] from 10/13/22 to 11/03/22 >> felt to be too high risk for surgical intervention - drain put in on 10/16/22 and exchanged on 12/01/22 - monitor drain output, stable, remains afebrile.  - f/u with surgery at [facility] in July   DM type 2. - SSI held given NPO, lower CBG, continue CBGs q 4hr  Peripheral diabetic neuropathy Immobility - cont lyrica - ongoing PT> needs to get OOB  Multiple pressure injuries, POA - cont WOC, frequent turns, nutrition   Best Practice (right click and "Reselect all SmartList Selections" daily)   Diet/type: Regular consistency (see orders)> NPO pending EGD/ colonoscopy  DVT prophylaxis: SCD GI prophylaxis: PPI Lines: Central line Foley:  N/A Code Status:  DNR Last date of multidisciplinary goals of care discussion [daughter updated at bedside]  Patient updated at bedside.  Pt's sister and niece at bedside as well.   Labs       Latest Ref Rng & Units 12/17/2022    4:40 AM 12/16/2022    7:30 AM 12/15/2022   11:40 AM  CMP  Glucose 70 - 99 mg/dL 161  096  78   BUN 8 - 23 mg/dL 12  27  20    Creatinine 0.61 - 1.24 mg/dL 0.45  4.09  8.11   Sodium 135 - 145 mmol/L 134  135  134   Potassium 3.5 - 5.1 mmol/L 3.1  3.7  3.4   Chloride 98 - 111 mmol/L 98  99  99   CO2 22 - 32 mmol/L 27  26  27    Calcium 8.9 - 10.3 mg/dL 7.8  8.4  8.5        Latest Ref Rng & Units 12/17/2022    4:40 AM 12/17/2022    1:13 AM 12/16/2022    2:33 PM  CBC  WBC 4.0 - 10.5 K/uL 11.5     Hemoglobin 13.0 - 17.0 g/dL 8.6  8.6  8.2   Hematocrit 39.0 - 52.0 % 26.2     Platelets 150 - 400 K/uL 128       CBG (last 3)  Recent Labs    12/16/22 1951  12/16/22 2347 12/17/22 0719  GLUCAP 98 95 84   Critical care time: 30 minutes     Posey Boyer, MSN, AG-ACNP-BC [facility] 12/17/2022, 9:45 AM  See Amion for pager If no response to pager, please call PCCM consult pager After 7:00 pm call Elink

## 2022-12-17 NOTE — Op Note (Signed)
[facility] Patient Name: Justin [name] Procedure Date : 12/17/2022 MRN: [id number] Attending MD: Napoleon Form , MD, [id number] Date of Birth: Oct 26, 1945 CSN: [id number] Age: 77 Admit Type: Inpatient Procedure:                Colonoscopy Indications:              Evaluation of unexplained GI bleeding presenting                            with Hematochezia Providers:                Napoleon Form, MD, Salley Scarlet,                            Technician, Wendee Copp, Referring MD:              Medicines:                Fentanyl 50 micrograms IV, Midazolam 6 mg IV,                            Diphenhydramine 25 mg IV Complications:            No immediate complications. Estimated Blood Loss:     Estimated blood loss was minimal. Procedure:                Pre-Anesthesia Assessment:                           - Prior to the procedure, a History and Physical                            was performed, and patient medications and                            allergies were reviewed. The patient's tolerance of                            previous anesthesia was also reviewed. The risks                            and benefits of the procedure and the sedation                            options and risks were discussed with the patient.                            All questions were answered, and informed consent                            was obtained. Prior Anticoagulants: The patient                            last took Eliquis (apixaban) 3 days prior to the  procedure. ASA Grade Assessment: IV - A patient                            with severe systemic disease that is a constant                            threat to life. After reviewing the risks and                            benefits, the patient was deemed in satisfactory                            condition to undergo the procedure.                           After obtaining informed  consent, the colonoscope                            was passed under direct vision. Throughout the                            procedure, the patient's blood pressure, pulse, and                            oxygen saturations were monitored continuously. The                            PCF-190TL ([id number]) Olympus colonoscope was                            introduced through the anus and advanced to the the                            cecum, identified by appendiceal orifice and                            ileocecal valve. The colonoscopy was performed                            without difficulty. The patient tolerated the                            procedure well. The quality of the bowel                            preparation was adequate. The ileocecal valve,                            appendiceal orifice, and rectum were photographed. Scope In: 1:29:27 PM Scope Out: 1:43:19 PM Scope Withdrawal Time: 0 hours 8 minutes 19 seconds  Total Procedure Duration: 0 hours 13 minutes 52 seconds  Findings:      The perianal and digital rectal examinations were normal.      A 20 mm polyp was found in the  cecum. The polyp was sessile. Polypectomy       was not attempted due to the patient's condition, admission with GI       hemorrhage, was a bedside procedure in the ICU      A single (solitary) four mm ulcer was found in the rectum. No bleeding       was present. Stigmata of recent bleeding were present. For hemostasis,       three hemostatic clips were successfully placed (MR conditional). There       was no bleeding at the end of the procedure.      A single localized bleeding erosion was found in the rectum. Stigmata of       recent bleeding were present. For hemostasis, one hemostatic clip was       successfully placed. There was no bleeding at the end of the procedure. Impression:               - One 20 mm polyp in the cecum. Resection not                            attempted.                            - A single (solitary) ulcer in the rectum. Clips                            (MR conditional) were placed.                           - A single erosion in the rectum. Clip was placed.                           - No specimens collected. Moderate Sedation:      Moderate (conscious) sedation was administered by the nurse and       supervised by the endoscopist. The patient's oxygen saturation, heart       rate, blood pressure and response to care were monitored. Total       physician intraservice time was 20 minutes. Recommendation:           - Patient has a contact number available for                            emergencies. The signs and symptoms of potential                            delayed complications were discussed with the                            patient. Return to normal activities tomorrow.                            Written discharge instructions were provided to the                            patient.                           -  Resume previous diet.                           - Continue present medications.                           - See the other procedure note for documentation of                            additional recommendations. Procedure Code(s):        --- Professional ---                           [phone], Colonoscopy, flexible; with control of                            bleeding, any method                           99152, 59, Moderate sedation services provided by                            the same physician or other qualified health care                            professional performing the diagnostic or                            therapeutic service that the sedation supports,                            requiring the presence of an independent trained                            observer to assist in the monitoring of the                            patient's level of consciousness and physiological                            status; initial 15 minutes  of intraservice time,                            patient age 28 years or older Diagnosis Code(s):        --- Professional ---                           D12.0, Benign neoplasm of cecum                           K62.6, Ulcer of anus and rectum                           K92.1, Melena (includes Hematochezia) CPT copyright 2022 American [employer] Association. All rights reserved. The codes documented in this report are preliminary and upon coder review  may  be revised to meet current compliance requirements. Napoleon Form, MD 12/17/2022 2:43:48 PM This report has been signed electronically. Number of Addenda: 0

## 2022-12-17 NOTE — Progress Notes (Signed)
eLink Physician-Brief Progress Note Patient Name: Justin [name] DOB: Jan 18, 1946 MRN: [id number]   Date of Service  12/17/2022  HPI/Events of Note  K+ 3.1, Cr 3.26  eICU Interventions  KCL 10 meq iv Q 1 hour x 2 doses ordered.        Thomasene Lot Leeloo Silverthorne 12/17/2022, 5:55 AM

## 2022-12-17 NOTE — Progress Notes (Signed)
   12/17/22 0400  Vitals  BP (!) 89/70  MAP (mmHg) 76  BP Location Right Arm  BP Method Automatic  Patient Position (if appropriate) Lying  Pulse Rate (!) 119  ECG Heart Rate (!) 118  Resp 15  Oxygen Therapy  SpO2 100 %  O2 Device Nasal Cannula  O2 Flow Rate (L/min) 1 L/min  Patient Activity (if Appropriate) In bed  Pulse Oximetry Type Continuous  Oximetry Probe Site Changed No  During Treatment Monitoring  Intra-Hemodialysis Comments Tx completed  Fistula / Graft Left Upper arm Arteriovenous fistula  No Placement Date or Time found.   Placed prior to admission: Yes  Orientation: Left  Access Location: Upper arm  Access Type: Arteriovenous fistula  Site Condition No complications  Fistula / Graft Assessment Present;Thrill;Bruit  Status Deaccessed  Drainage Description None  CVC Triple Lumen 12/14/22 Right Femoral  Placement Date/Time: 12/14/22 0300   Time Out: Correct site;Correct patient;Correct procedure  Inserted By: Physician  Inserted by (if other than self): C. Pollina  Orientation: Right  Location: Femoral  Securement Method: Securement Device;Sutured  P...  Site Assessment Clean, Dry, Intact  Proximal Lumen Status Infusing  Medial Lumen Status Infusing  Distal Lumen Status Infusing  Dressing Type Transparent  Dressing Status Antimicrobial disc in place  Dressing Change Due 12/19/22   During dialysis blood pressure drop frequently.

## 2022-12-18 DIAGNOSIS — N186 End stage renal disease: Secondary | ICD-10-CM | POA: Diagnosis not present

## 2022-12-18 DIAGNOSIS — K921 Melena: Secondary | ICD-10-CM | POA: Diagnosis not present

## 2022-12-18 DIAGNOSIS — R578 Other shock: Secondary | ICD-10-CM | POA: Diagnosis not present

## 2022-12-18 DIAGNOSIS — Z992 Dependence on renal dialysis: Secondary | ICD-10-CM | POA: Diagnosis not present

## 2022-12-18 DIAGNOSIS — I959 Hypotension, unspecified: Secondary | ICD-10-CM

## 2022-12-18 DIAGNOSIS — I480 Paroxysmal atrial fibrillation: Secondary | ICD-10-CM | POA: Diagnosis not present

## 2022-12-18 DIAGNOSIS — Q251 Coarctation of aorta: Secondary | ICD-10-CM

## 2022-12-18 LAB — BASIC METABOLIC PANEL
Anion gap: 13 (ref 5–15)
BUN: 14 mg/dL (ref 8–23)
CO2: 27 mmol/L (ref 22–32)
Calcium: 8.5 mg/dL — ABNORMAL LOW (ref 8.9–10.3)
Chloride: 94 mmol/L — ABNORMAL LOW (ref 98–111)
Creatinine, Ser: 5.02 mg/dL — ABNORMAL HIGH (ref 0.61–1.24)
GFR, Estimated: 11 mL/min — ABNORMAL LOW (ref >60)
Glucose, Bld: 90 mg/dL (ref 70–99)
Potassium: 3.6 mmol/L (ref 3.5–5.1)
Sodium: 134 mmol/L — ABNORMAL LOW (ref 135–145)

## 2022-12-18 LAB — GLUCOSE, CAPILLARY
Glucose-Capillary: 117 mg/dL — ABNORMAL HIGH (ref 70–99)
Glucose-Capillary: 121 mg/dL — ABNORMAL HIGH (ref 70–99)
Glucose-Capillary: 140 mg/dL — ABNORMAL HIGH (ref 70–99)
Glucose-Capillary: 78 mg/dL (ref 70–99)
Glucose-Capillary: 89 mg/dL (ref 70–99)
Glucose-Capillary: 89 mg/dL (ref 70–99)
Glucose-Capillary: 92 mg/dL (ref 70–99)

## 2022-12-18 LAB — PHOSPHORUS: Phosphorus: 2.7 mg/dL (ref 2.5–4.6)

## 2022-12-18 LAB — CBC
HCT: 23.6 % — ABNORMAL LOW (ref 39.0–52.0)
Hemoglobin: 7.6 g/dL — ABNORMAL LOW (ref 13.0–17.0)
MCH: 30.5 pg (ref 26.0–34.0)
MCHC: 32.2 g/dL (ref 30.0–36.0)
MCV: 94.8 fL (ref 80.0–100.0)
Platelets: 142 10*3/uL — ABNORMAL LOW (ref 150–400)
RBC: 2.49 MIL/uL — ABNORMAL LOW (ref 4.22–5.81)
RDW: 18.2 % — ABNORMAL HIGH (ref 11.5–15.5)
WBC: 8.8 10*3/uL (ref 4.0–10.5)
nRBC: 0 % (ref 0.0–0.2)

## 2022-12-18 LAB — MAGNESIUM: Magnesium: 2.3 mg/dL (ref 1.7–2.4)

## 2022-12-18 MED ORDER — NOREPINEPHRINE 16 MG/250ML-% IV SOLN
0.0000 ug/min | INTRAVENOUS | Status: DC
  Administered 2022-12-18: 4 ug/min via INTRAVENOUS
  Filled 2022-12-18: qty 250

## 2022-12-18 MED ORDER — ALBUMIN HUMAN 25 % IV SOLN
INTRAVENOUS | Status: AC
  Administered 2022-12-18: 25 g via INTRAVENOUS
  Filled 2022-12-18: qty 100

## 2022-12-18 MED ORDER — MIDODRINE HCL 5 MG PO TABS
20.0000 mg | ORAL_TABLET | Freq: Three times a day (TID) | ORAL | Status: DC
  Administered 2022-12-18 – 2023-01-02 (×46): 20 mg via ORAL
  Filled 2022-12-18 (×46): qty 4

## 2022-12-18 MED ORDER — CHLORHEXIDINE GLUCONATE CLOTH 2 % EX PADS
6.0000 | MEDICATED_PAD | CUTANEOUS | Status: DC
  Administered 2022-12-19: 6 via TOPICAL

## 2022-12-18 MED ORDER — MIDODRINE HCL 5 MG PO TABS
5.0000 mg | ORAL_TABLET | Freq: Once | ORAL | Status: AC
  Administered 2022-12-18: 5 mg via ORAL
  Filled 2022-12-18: qty 1

## 2022-12-18 MED ORDER — ALBUMIN HUMAN 25 % IV SOLN: 25.0000 g | INTRAVENOUS | Status: DC | PRN

## 2022-12-18 MED ORDER — PANTOPRAZOLE SODIUM 40 MG PO TBEC
40.0000 mg | DELAYED_RELEASE_TABLET | Freq: Two times a day (BID) | ORAL | Status: DC
  Administered 2022-12-18 – 2022-12-22 (×8): 40 mg via ORAL
  Filled 2022-12-18 (×8): qty 1

## 2022-12-18 NOTE — Progress Notes (Signed)
PT Cancellation Note  Patient Details Name: Justin [name] MRN: [id number] DOB: 03/14/1946   Cancelled Treatment:    Reason Eval/Treat Not Completed: Patient at procedure or test/unavailable. Pt undergoing HD in room.   Ilda Foil 12/18/2022, 12:35 PM

## 2022-12-18 NOTE — Progress Notes (Signed)
NAME:  Justin [name], MRN:  [id number], DOB:  27-Mar-1946, LOS: 4 ADMISSION DATE:  12/13/2022, CONSULTATION DATE:  12/14/22 REFERRING MD:  Blinda Leatherwood , CHIEF COMPLAINT:  GIB   History of Present Illness:  77 yo male with hx of A fib on eliquis and rectal AVM with recurrent GI bleeding presented from NH with rectal bleeding.  Developed hypotension.  Received PRBC and Kcentra.  Started on pressors.  PCCM asked to admit to ICU.  Pertinent  Medical History  ESRD, A fib, Rectal AVM, Chronic hypotension, Moderate aortic stenosis, Prostate cancer, DM type 2, DM retinopathy and peripheral neuropathy, Gout, HTN  Significant Hospital Events: Including procedures, antibiotic start and stop dates in addition to other pertinent events   3/07 Colonoscopy >> cecal polyp not removed, solitary ulcer in proximal transverse colon from prior polypectomy 3 clips placed, solitary ulcer mid transverse colon 2 clips placed, few polyps in transverse colon, diverticulosis in transverse colon and Lt colon, blood in rectum and recto-sigmoid colon, one active bleeding angiodysplastic lesion in distal rectum with few AVMs treated with argon plasma coagulation 4/02 to 4/23 Admit to [facility] for acute cholecystitis with septic shock and NSTEMI 4/28 to 5/14 Admit for lower GI bleed, PNA 6/02 to ED from [facility] with rectal bleeding. 6/03 progressive hem shock. 3 prbc, kcentra, CVC placed and on pressors. 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 rate 17,  height 5\' 10"  (1.778 m), weight 87.1 kg, SpO2 100 %.        Intake/Output Summary (Last 24 hours) at 12/18/2022 0741 Last data filed at 12/18/2022 0700 Gross per 24 hour  Intake 593.61 ml  Output --  Net 593.61 ml   Filed Weights   12/16/22 0500 12/17/22 0442 12/18/22 0500  Weight: 84.9 kg 84.9 kg 87.1 kg    Examination: General: Chronically ill-appearing HEENT: Moist oral mucosa Neuro: Awake and alert, oriented x 3 CV: S1-S2 appreciated, does have a femoral line in place PULM: Clear breath sounds  GI: Bowel sounds appreciated Extremities: warm/dry, no LE edema - still very sensitive to touch, LUE AVF +b/t Skin: no rashes   Labs: BUN 14, creatinine 5.02 Hemoglobin 7.6, hematocrit 23.6  Resolved Hospital Problem list     Assessment & Plan:   Acute lower GI bleed with history of radiation proctitis History of distal rectum and showed dysplastic lesion and rectal AVM -A polyp was noted in the cecum -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 -Has no stigmata of ongoing bleeding -Hemodynamics remained stable -H&H did trend down some but stable -To continue PPI orally -Tolerating orally at present  Hemorrhagic shock Acute blood loss anemia secondary to GI bleeding -Has remained stable -Still requires central line for pressors  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 Aortic stenosis Chronic hypotension on midodrine with hemodialysis -Will continue to hold Eliquis-possibly may not be able to reinitiate safely  End-stage renal disease -Continue hemodialysis  Acute cholecystitis s/p percutaneous cholecystostomy tube -Has appointment to follow-up with [facility] in July -Drain was not sustained 12/01/2022  Type 2 diabetes -Continue SSI  Peripheral diabetic neuropathy -Continue Lyrica  Multiple pressure  injuries present on admission -Continue wound management  Goal will be to try and wean off pressors If able to wean off pressors, will discontinue central line  Best Practice (right click and  "Reselect all SmartList Selections" daily)   Diet/type: Regular consistency (see orders)> NPO pending EGD/ colonoscopy  DVT prophylaxis: SCD GI prophylaxis: PPI Lines: Central line Foley:  N/A Code Status:  DNR Last date of multidisciplinary goals of care discussion [daughter updated at bedside]  Labs       Latest Ref Rng & Units 12/18/2022    3:23 AM 12/17/2022    4:40 AM 12/16/2022    7:30 AM  CMP  Glucose 70 - 99 mg/dL 90  829  562   BUN 8 - 23 mg/dL 14  12  27    Creatinine 0.61 - 1.24 mg/dL 1.30  8.65  7.84   Sodium 135 - 145 mmol/L 134  134  135   Potassium 3.5 - 5.1 mmol/L 3.6  3.1  3.7   Chloride 98 - 111 mmol/L 94  98  99   CO2 22 - 32 mmol/L 27  27  26    Calcium 8.9 - 10.3 mg/dL 8.5  7.8  8.4        Latest Ref Rng & Units 12/18/2022    3:23 AM 12/17/2022    4:40 AM 12/17/2022    1:13 AM  CBC  WBC 4.0 - 10.5 K/uL 8.8  11.5    Hemoglobin 13.0 - 17.0 g/dL 7.6  8.6  8.6   Hematocrit 39.0 - 52.0 % 23.6  26.2    Platelets 150 - 400 K/uL 142  128      CBG (last 3)  Recent Labs    12/18/22 0008 12/18/22 0401 12/18/22 0718  GLUCAP 89 92 78   The patient is critically ill with multiple organ systems failure and requires high complexity decision making for assessment and support, frequent evaluation and titration of therapies, application of advanced monitoring technologies and extensive interpretation of multiple databases. Critical Care Time devoted to patient care services described in this note independent of APP/resident time (if applicable)  is 31 minutes.   Virl Diamond MD [facility] Personal pager: See Amion If unanswered, please page CCM On-call: #[phone]

## 2022-12-18 NOTE — Progress Notes (Signed)
Patient ID: Justin [name], male   DOB: 1946/07/11, 77 y.o.   MRN: [id number] S: More awake and alert this am.  Had HD after midnight yesterday so is off schedule. O:BP 90/60   Pulse (!) 108   Temp 97.9 F (36.6 C) (Oral)   Resp 13   Ht 5\' 10"  (1.778 m)   Wt 87.1 kg   SpO2 94%   BMI 27.55 kg/m   Intake/Output Summary (Last 24 hours) at 12/18/2022 0930 Last data filed at 12/18/2022 0900 Gross per 24 hour  Intake 447.75 ml  Output --  Net 447.75 ml   Intake/Output: I/O last 3 completed shifts: In: 696.1 [P.O.:50; I.V.:496.2; IV Piggyback:149.9] Out: 0   Intake/Output this shift:  Total I/O In: 17.8 [I.V.:17.8] Out: -  Weight change: 2.2 kg Gen: NAD CVS: Tachy, III/VI SEM LUSB Resp: CTA Abd:+BS,soft, NT/ND Ext: no edema, LUE AVF +T/B  Recent Labs  Lab 12/13/22 2340 12/13/22 2353 12/14/22 0316 12/14/22 1746 12/15/22 0500 12/15/22 1140 12/16/22 0730 12/17/22 0440 12/18/22 0322 12/18/22 0323  NA 137 140 137  --  136 134* 135 134*  --  134*  K 3.5 4.4 3.2* 3.6 3.4* 3.4* 3.7 3.1*  --  3.6  CL 98 97* 99  --  99 99 99 98  --  94*  CO2 30  --  27  --  26 27 26 27   --  27  GLUCOSE 103* 101* 115*  --  103* 78 145* 106*  --  90  BUN 23 36* 22  --  19 20 27* 12  --  14  CREATININE 6.76* 7.00* 6.46*  --  5.16* 5.59* 6.55* 3.26*  --  5.02*  ALBUMIN 1.9*  --   --   --  2.5*  --   --   --   --   --   CALCIUM 8.2*  --  7.8*  --  8.5* 8.5* 8.4* 7.8*  --  8.5*  PHOS  --   --   --   --  2.3*  --   --   --  2.7  --   AST 30  --   --   --   --   --   --   --   --   --   ALT 15  --   --   --   --   --   --   --   --   --    Liver Function Tests: Recent Labs  Lab 12/13/22 2340 12/15/22 0500  AST 30  --   ALT 15  --   ALKPHOS 62  --   BILITOT 0.8  --   PROT 6.0*  --   ALBUMIN 1.9* 2.5*   No results for input(s): "LIPASE", "AMYLASE" in the last 168 hours. No results for input(s): "AMMONIA" in the last 168 hours. CBC: Recent Labs  Lab 12/15/22 1643 12/15/22 1813  12/15/22 2137 12/16/22 0517 12/16/22 0939 12/16/22 1433 12/17/22 0113 12/17/22 0440 12/18/22 0323  WBC 10.6*  --  11.1*  --  12.5*  --   --  11.5* 8.8  HGB 9.6*   < > 8.7*   < > 8.4*   < > 8.6* 8.6* 7.6*  HCT 28.7*   < > 27.0*  --  25.6*  --   --  26.2* 23.6*  MCV 94.4  --  93.1  --  93.4  --   --  91.0 94.8  PLT  122*  --  125*  --  132*  --   --  128* 142*   < > = values in this interval not displayed.   Cardiac Enzymes: No results for input(s): "CKTOTAL", "CKMB", "CKMBINDEX", "TROPONINI" in the last 168 hours. CBG: Recent Labs  Lab 12/17/22 1104 12/17/22 2005 12/18/22 0008 12/18/22 0401 12/18/22 0718  GLUCAP 92 95 89 92 78    Iron Studies: No results for input(s): "IRON", "TIBC", "TRANSFERRIN", "FERRITIN" in the last 72 hours. Studies/Results: No results found.  Chlorhexidine Gluconate Cloth  6 each Topical Q0600   midodrine  20 mg Oral Q8H   midodrine  5 mg Oral Once   pantoprazole  40 mg Oral BID   pregabalin  75 mg Oral Daily    BMET    Component Value Date/Time   NA 134 (L) 12/18/2022 0323   K 3.6 12/18/2022 0323   CL 94 (L) 12/18/2022 0323   CO2 27 12/18/2022 0323   GLUCOSE 90 12/18/2022 0323   BUN 14 12/18/2022 0323   CREATININE 5.02 (H) 12/18/2022 0323   CALCIUM 8.5 (L) 12/18/2022 0323   GFRNONAA 11 (L) 12/18/2022 0323   GFRAA 8 (L) 02/01/2020 0424   CBC    Component Value Date/Time   WBC 8.8 12/18/2022 0323   RBC 2.49 (L) 12/18/2022 0323   HGB 7.6 (L) 12/18/2022 0323   HCT 23.6 (L) 12/18/2022 0323   PLT 142 (L) 12/18/2022 0323   MCV 94.8 12/18/2022 0323   MCH 30.5 12/18/2022 0323   MCHC 32.2 12/18/2022 0323   RDW 18.2 (H) 12/18/2022 0323   LYMPHSABS 0.9 11/23/2022 0137   MONOABS 1.1 (H) 11/23/2022 0137   EOSABS 0.1 11/23/2022 0137   BASOSABS 0.1 11/23/2022 0137    Dialysis Orders: OP HD: [facility] 3h  450/600  99kg  2/2.5 bath  LUA AVF  Hep none - last HD 4/26, post wt 111kg (lowered during hospitalization last month to 96kg) -  rocaltrol 0.75 mcg po tiw - venofer 100mg  tiw thru 5/08 - mircera 200 mcg IV q 2 wks, last 4/26 - last Hb 7.7 on 4/24   Assessment/Plan:  Hemorrhagic shock - pressors to keep MAP >65.  He has received 3 units PRBC's.  CT angio negative for active bleed.  Hold eliquis for now.  Transfuse as needed.  Agree with midodrine and titrate norepi as able.  Had another episode of rectal bleeding and hematochezia on 12/15/22, seen by GI and had EGD and colonoscopy yesterday.  EGD negative, colonoscopy with rectal ulcer s/p clip.  Hgb trending down to 7.6.  transfuse prn.    ESRD -  Tolerated HD with pressor support yesterday.  Plan for HD tomorrow, since he finished 3 am yesterday.  Will get back on MWF schedule on Monday. Would like to have HD in ICU again just incase pressor support is required.    Hypertension/volume  -  currently below his edw, however did receive large amount of blood products.  Was able to UF 1.6 L yesterday.  Will plan for HD at bedside tomorrow and use pressors prn for MAP >65.  Wean pressors as able.  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  S/p blood transfusions x 3.  Follow H/H and transfuse prn.  Metabolic bone disease -  continue with home meds  Nutrition -  renal diet, carb modified.  A fib/flutter - would not resume eliquis at this time as risks outweigh benefits.    Acute cholecystitis  s/p percutaneous cholecystostomy tube - from OSH 4/2-4/23/24 and felt to be too high risk for surgical intervention.  Drain exchanged 12/01/22 and f/u with surgery at [facility] in July.  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  Currently DNR.  May benefit from ongoing discussions with palliative care for possible transition to comfort if he were to deteriorate further  Irena Cords, MD [facility]

## 2022-12-18 NOTE — Progress Notes (Cosign Needed Addendum)
Patient ID: JAWANZA ZAMBITO, male   DOB: 18-Jan-1946, 77 y.o.   MRN: [id number]    Progress Note   Subjective   Day # 5  CC; acute recurrent GI bleed with melena/hematochezia and hemorrhagic shock Required Kcentra/FFP and 3 units packed RBCs on 12/15/2022 Eliquis on hold  EGD 12/17/2022 with finding of a subepithelial lesion 40 mm in size concerning for an isolated gastric varix, with stigmata of recent bleeding  Colonoscopy  12/17/2022-20 mm polyp in the cecum, sessile not removed single 4 mm ulcer in the rectum no bleeding but had stigmata of recent bleeding, 3 clips placed single localized erosion in the rectum, 1 clip placed  Labs-WBC 8.8/hemoglobin 7.6 down from 8.6 yesterday Plts 142 BUN 14/creatinine 5.0  Undergoing dialysis at bedside currently. He denies any abdominal pain, has been to tolerate p.o.'s today No melena or bowel movements this a.m.  Patient asking questions about his procedures yesterday and may be able to be done about his stomach.  He is asking about his cholecystostomy tube as well which he says hurts at the insertion site   Objective   Vital signs in last 24 hours: Temp:  [97.5 F (36.4 C)-98.5 F (36.9 C)] 98.5 F (36.9 C) (06/07 1155) Pulse Rate:  [85-114] 105 (06/07 1130) Resp:  [11-23] 15 (06/07 1130) BP: (68-125)/(37-109) 120/72 (06/07 1130) SpO2:  [83 %-100 %] 97 % (06/07 1130) Weight:  [87.1 kg] 87.1 kg (06/07 0500) Last BM Date : 12/17/22 General:    Early African-American male in NAD Heart:  irRegular rate and rhythm; no murmurs Lungs: Respirations even and unlabored, lungs CTA bilaterally Abdomen:  Soft, nontender and nondistended. Normal bowel sounds.  Cholecystostomy tube in place Extremities:  Without edema. Neurologic:  Alert and oriented,  grossly normal neurologically. Psych:  Cooperative. Normal mood and affect.  Intake/Output from previous day: 06/06 0701 - 06/07 0700 In: 593.6 [P.O.:50; I.V.:393.7; IV Piggyback:149.9] Out: -   Intake/Output this shift: Total I/O In: 276.1 [P.O.:240; I.V.:36.1] Out: -   Lab Results: Recent Labs    12/16/22 0939 12/16/22 1433 12/17/22 0113 12/17/22 0440 12/18/22 0323  WBC 12.5*  --   --  11.5* 8.8  HGB 8.4*   < > 8.6* 8.6* 7.6*  HCT 25.6*  --   --  26.2* 23.6*  PLT 132*  --   --  128* 142*   < > = values in this interval not displayed.   BMET Recent Labs    12/16/22 0730 12/17/22 0440 12/18/22 0323  NA 135 134* 134*  K 3.7 3.1* 3.6  CL 99 98 94*  CO2 26 27 27   GLUCOSE 145* 106* 90  BUN 27* 12 14  CREATININE 6.55* 3.26* 5.02*  CALCIUM 8.4* 7.8* 8.5*   LFT No results for input(s): "PROT", "ALBUMIN", "AST", "ALT", "ALKPHOS", "BILITOT", "BILIDIR", "IBILI" in the last 72 hours. PT/INR No results for input(s): "LABPROT", "INR" in the last 72 hours.       Assessment / Plan:    #79 77 year old African-American male admitted with recurrent acute major GI bleed with hemorrhagic shock in setting of Eliquis Required Kcentra/FFP and 3 units packed RBCs  EGD and colonoscopy yesterday.  EGD with finding of a subepithelial lesion 4 cm in size in the stomach concerning for an isolated gastric varix versus GIST No active bleeding but stigmata present of recent bleeding.  Patient's recent imaging/CTA was reviewed with Dr. Curlene Dolphin yesterday per Dr. 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  This lesion is felt to be likely source of his recent hemorrhage  He also has previously documented rectal AVMs, and 1 solitary ulcer found yesterday at site of previous treated AVM.  This was treated with hemoclips x 3 and a few erosions from the previously treated AVMs, non bleeding.  No further active bleeding today. Remains on pressors  #2 end-stage renal disease on dialysis-dialyzing currently #3 history of atrial fibs/flutter had been on Eliquis, on hold  Do not think Eliquis  should be resumed adding of 2 recent major GI bleeds requiring hospitalization.  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  However if patient is not a surgical candidate then this is unlikely to change his management. He was deemed by surgery at [facility] in April to be a nonsurgical candidate and that is why cholecystostomy tube was placed. Is not been seen by surgery here if and could consider surgical consultation regarding when to remove the cholecystostomy tube, and for opinion regarding potential surgical resection of gastric GIST.  With his multiple comorbidities, feel very unlikely that he would be appropriate for surgery.  As he improves during this admission may be good idea to [facility] palliative care discussions, and if he has further decline this admission may be most appropriate to move towards comfort care.    Principal Problem:   Hemorrhagic shock (HCC) Active Problems:   End stage renal disease (HCC)   Rectal ulcer   Bleeding gastric varices     LOS: 4 days   Suvi Archuletta PA-C 12/18/2022, 11:58 AM   Attending physician's note   I have taken a history, reviewed the chart and examined the patient. I performed a substantive portion of this encounter, including complete performance of at least one of the key components, in conjunction with the APP. I agree with the APP's note, impression and recommendations.    No further bleeding, hemoglobin has drifted down slightly but  stabilized. Remains hemodynamically stable.  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 Unclear if EUS with biopsy would alter management if he is not going to be a surgical candidate for removal of GIST Eliquis is currently on hold, please discuss with prescribing provider if he needs to continue Eliquis given history of significant GI bleed and is at risk for recurrent GI bleed.  If he needs to restart it due to high thrombotic risk, okay to restart Eliquis in 5 days from GI standpoint. He may benefit from palliative care consult  GI will sign off but available if have any questions or concerns  The patient was provided an opportunity to ask questions and all were answered. The patient agreed with the plan  and demonstrated an understanding of the instructions.   Iona Beard , MD [phone]

## 2022-12-18 NOTE — TOC Progression Note (Signed)
Transition of Care [facility]) - Progression Note    Patient Details  Name: Justin [name] MRN: [id number] Date of Birth: [birth year]  [facility] CM/SW Contact  Tom-Johnson, Hershal Coria, RN Phone Number: 12/18/2022, 4:41 PM  Clinical Narrative:     CM spoke with patient's niece, [name] [phone]) about home health needs. [name] requested [employer], CM called referral to [facility] with acceptance voiced, info on AVS. Patient continues on Pressors, on room air. Has a cholecystostomy tube, GI following. Patient having inpatient dialysis at bedside, Nephrology following.  CM will continue to follow as patient progresses with care towards discharge.          Expected Discharge Plan: Home w Home Health Services Barriers to Discharge: Continued Medical Work up  Expected Discharge Plan and Services In-house Referral: Clinical Social Work     Living arrangements for the past 2 months: Skilled Nursing Facility, Single Family Home                                       Social Determinants of Health (SDOH) Interventions SDOH Screenings   Food Insecurity: No Food Insecurity (11/24/2022)  Housing: Low Risk  (11/24/2022)  Transportation Needs: No Transportation Needs (11/24/2022)  Utilities: Not At Risk (11/24/2022)  Tobacco Use: Medium Risk (12/17/2022)    Readmission Risk Interventions    11/14/2022   10:08 AM 09/18/2022    4:10 PM 06/17/2022    2:47 PM  Readmission Risk Prevention Plan  Transportation Screening Complete Complete Complete  HRI or Home Care Consult   Complete  Social Work Consult for Recovery Care Planning/Counseling   Complete  Palliative Care Screening   Not Applicable  Medication Review Oceanographer) Complete Complete Complete  PCP or Specialist appointment within 3-5 days of discharge Complete Complete   HRI or Home Care Consult Complete Complete   SW Recovery Care/Counseling Consult Complete    Palliative Care Screening Not Applicable  Complete   Skilled Nursing Facility Complete Not Applicable

## 2022-12-18 NOTE — Progress Notes (Signed)
POST HD TX NOTE  12/18/22 1755  Vitals  Temp 98.1 F (36.7 C)  Temp Source Oral  BP (!) 97/58  MAP (mmHg) 69  BP Location Right Arm  BP Method Automatic  Patient Position (if appropriate) Lying  Pulse Rate (!) 111  Pulse Rate Source Monitor  ECG Heart Rate (!) 112  Resp 18  Oxygen Therapy  SpO2 94 %  O2 Device Room Air  Pulse Oximetry Type Continuous  During Treatment Monitoring  Intra-Hemodialysis Comments (S)   (post HD tx VS check)  Post Treatment  Dialyzer Clearance (S)  Lightly streaked (however, there were a lot of clots on top of dialyzer)  Duration of HD Treatment -hour(s) 3.5 hour(s)  Hemodialysis Intake (mL) 100 mL (albumin)  Liters Processed 84  Fluid Removed (mL) 2500 mL ( (value from machine) - (albumin) = 2500 ml)  Tolerated HD Treatment (S)  Yes (but w/ the aid of levophed and albumin)  Post-Hemodialysis Comments (S)  tx completed w/ not a lot of bp issue throughout tx, using levo and albumin to keep bp > 110 per MD order. bp dropped down into 90's a few times but then bounced back up on next 15 min check. post tx VS check the bp is in the 90's but primary nurse had came and turned the levo back down while i was holding sites, so bp dropped. UF goal met, blood rinsed back, VSS. Medication Admin: Albumin 25% 25G IVBP  AVG/AVF Arterial Site Held (minutes) 10 minutes  AVG/AVF Venous Site Held (minutes) 10 minutes  Fistula / Graft Left Upper arm Arteriovenous fistula  No Placement Date or Time found.   Placed prior to admission: Yes  Orientation: Left  Access Location: Upper arm  Access Type: Arteriovenous fistula  Site Condition No complications  Fistula / Graft Assessment Bruit;Thrill;Present  Drainage Description None

## 2022-12-19 DIAGNOSIS — N186 End stage renal disease: Secondary | ICD-10-CM | POA: Diagnosis not present

## 2022-12-19 DIAGNOSIS — K922 Gastrointestinal hemorrhage, unspecified: Secondary | ICD-10-CM | POA: Diagnosis not present

## 2022-12-19 DIAGNOSIS — R578 Other shock: Secondary | ICD-10-CM | POA: Diagnosis not present

## 2022-12-19 DIAGNOSIS — Z992 Dependence on renal dialysis: Secondary | ICD-10-CM | POA: Diagnosis not present

## 2022-12-19 LAB — CBC
HCT: 23.6 % — ABNORMAL LOW (ref 39.0–52.0)
Hemoglobin: 7.5 g/dL — ABNORMAL LOW (ref 13.0–17.0)
MCH: 29.8 pg (ref 26.0–34.0)
MCHC: 31.8 g/dL (ref 30.0–36.0)
MCV: 93.7 fL (ref 80.0–100.0)
Platelets: 136 10*3/uL — ABNORMAL LOW (ref 150–400)
RBC: 2.52 MIL/uL — ABNORMAL LOW (ref 4.22–5.81)
RDW: 18.1 % — ABNORMAL HIGH (ref 11.5–15.5)
WBC: 8.1 10*3/uL (ref 4.0–10.5)
nRBC: 0 % (ref 0.0–0.2)

## 2022-12-19 LAB — COMPREHENSIVE METABOLIC PANEL
ALT: 11 U/L (ref 0–44)
AST: 16 U/L (ref 15–41)
Albumin: 2.3 g/dL — ABNORMAL LOW (ref 3.5–5.0)
Alkaline Phosphatase: 49 U/L (ref 38–126)
Anion gap: 8 (ref 5–15)
BUN: 7 mg/dL — ABNORMAL LOW (ref 8–23)
CO2: 30 mmol/L (ref 22–32)
Calcium: 8.2 mg/dL — ABNORMAL LOW (ref 8.9–10.3)
Chloride: 96 mmol/L — ABNORMAL LOW (ref 98–111)
Creatinine, Ser: 3.63 mg/dL — ABNORMAL HIGH (ref 0.61–1.24)
GFR, Estimated: 16 mL/min — ABNORMAL LOW (ref >60)
Glucose, Bld: 119 mg/dL — ABNORMAL HIGH (ref 70–99)
Potassium: 3.3 mmol/L — ABNORMAL LOW (ref 3.5–5.1)
Sodium: 134 mmol/L — ABNORMAL LOW (ref 135–145)
Total Bilirubin: 0.7 mg/dL (ref 0.3–1.2)
Total Protein: 5.5 g/dL — ABNORMAL LOW (ref 6.5–8.1)

## 2022-12-19 LAB — GLUCOSE, CAPILLARY
Glucose-Capillary: 113 mg/dL — ABNORMAL HIGH (ref 70–99)
Glucose-Capillary: 84 mg/dL (ref 70–99)
Glucose-Capillary: 143 mg/dL — ABNORMAL HIGH (ref 70–99)
Glucose-Capillary: 108 mg/dL — ABNORMAL HIGH (ref 70–99)
Glucose-Capillary: 104 mg/dL — ABNORMAL HIGH (ref 70–99)
Glucose-Capillary: 92 mg/dL (ref 70–99)

## 2022-12-19 MED ORDER — CALCITRIOL 0.5 MCG PO CAPS
0.7500 ug | ORAL_CAPSULE | ORAL | Status: DC
  Administered 2022-12-21 – 2022-12-30 (×5): 0.75 ug via ORAL
  Filled 2022-12-19 (×8): qty 1

## 2022-12-19 MED ORDER — HEPARIN SODIUM (PORCINE) 5000 UNIT/ML IJ SOLN
5000.0000 [IU] | Freq: Three times a day (TID) | INTRAMUSCULAR | Status: DC
  Administered 2022-12-19 – 2022-12-20 (×3): 5000 [IU] via SUBCUTANEOUS
  Filled 2022-12-19 (×3): qty 1

## 2022-12-19 MED ORDER — DARBEPOETIN ALFA 200 MCG/0.4ML IJ SOSY
200.0000 ug | PREFILLED_SYRINGE | INTRAMUSCULAR | Status: DC
  Administered 2022-12-21: 200 ug via SUBCUTANEOUS
  Filled 2022-12-19 (×2): qty 0.4

## 2022-12-19 MED ORDER — ALBUMIN HUMAN 25 % IV SOLN
25.0000 g | Freq: Four times a day (QID) | INTRAVENOUS | Status: AC
  Administered 2022-12-19 – 2022-12-20 (×4): 25 g via INTRAVENOUS
  Filled 2022-12-19 (×4): qty 100

## 2022-12-19 MED ORDER — KETOROLAC TROMETHAMINE 30 MG/ML IJ SOLN
30.0000 mg | Freq: Once | INTRAMUSCULAR | Status: AC
  Administered 2022-12-19: 30 mg via INTRAVENOUS
  Filled 2022-12-19: qty 1

## 2022-12-19 MED ORDER — CINACALCET HCL 30 MG PO TABS
30.0000 mg | ORAL_TABLET | Freq: Every day | ORAL | Status: DC
  Administered 2022-12-20 – 2023-01-02 (×13): 30 mg via ORAL
  Filled 2022-12-19 (×15): qty 1

## 2022-12-19 MED ORDER — POTASSIUM CHLORIDE CRYS ER 20 MEQ PO TBCR
40.0000 meq | EXTENDED_RELEASE_TABLET | Freq: Once | ORAL | Status: AC
  Administered 2022-12-19: 40 meq via ORAL
  Filled 2022-12-19: qty 2

## 2022-12-19 MED ORDER — SODIUM CHLORIDE 0.9 % IV SOLN: INTRAVENOUS | Status: AC

## 2022-12-19 NOTE — Progress Notes (Signed)
[facility] Progress Note  Subjective: remains on low dose levo 1- 5 ug/min.  I/O -1.9 L yesterday.   Vitals:   12/19/22 1545 12/19/22 1549 12/19/22 1600 12/19/22 1630  BP:   101/89 94/81  Pulse: (!) 102 (!) 102 (!) 104 (!) 143  Resp: 15 14 14 19   Temp:  (!) 96.3 F (35.7 C)    TempSrc:  Axillary    SpO2: 100% 100% 100% 100%  Weight:      Height:        Exam: Gen: NAD CVS: Tachy, III/VI SEM LUSB Resp: CTA Abd:+BS,soft, NT/ND Ext: no edema, LUE AVF +T/B   Home meds - lipitor, nephrovite, , sensipar 30mg  po qd, eliquis, midodrine 15mg  tid, protonix, lyrica, trazodone, prns/ vits/ supps   OP HD: [facility] M[facility] 3h  450/600  91.6kg  2/2.5 bath  LUA AVF  Hep none - coming in under dry wt around 89kg last 2 sessions - rocaltrol 0.75 mcg po tiw - venofer 50 iv weekly - mircera 200 mcg IV q 2 wks, last 5/27, due 6/10 - last Hb 9.6 on 5/29   Assessment/Plan:  Hemorrhagic shock - remains on low-dose pressors and po midodrine 20 tid to keep MAP >65.  S/P 3 units PRBC's.  CT angio negative for active bleed.  Eliquis on hold for now.  Had recurrent rectal bleeding/ hematochezia on 12/15/22, seen by GI and had EGD/ colonoscopy on 6/07.  EGD negative, colonoscopy with rectal ulcer s/p clip.  Hgb in mid 7's and stable.  Transfuse prn.    ESRD - getting iHD in ICU w/ minimal pressor support. Had HD MWF this past week here. Next HD Monday.   Hypertension/volume  -  currently well below his edw by 7kg, however he looks good on exam and not overloaded. Will stop pulling fluid w/ next HD and will give a little extra volume w/ some IVF's overnight and hopefully will help the BP's come up.   Anemia  - hb 7-8 range here. +ABLA on top of anemia of ESRD.  S/p blood transfusions x 3.  Last esa on 5/27, due on 6/10. Will order darbe 200 mcg sq weekly on Mondays while here.   Metabolic bone disease -  CCa and phos are in range, will resume po rocaltrol w/ hd tiw and resume sensipar 30mg  qd as  at home.   Nutrition -  renal diet, carb modified.  A fib/flutter - eliquis on hold at this time as risks outweigh benefits.    Acute cholecystitis s/p percutaneous cholecystostomy tube - from OSH 4/2- 4/23 and felt to be too high risk for surgical intervention.  Drain exchanged 12/01/22 and f/u with surgery at [facility] in July.  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  Currently DNR.  May benefit from ongoing discussions with palliative care for possible transition to comfort if he were to deteriorate further       Justin Moselle MD [facility] 12/19/2022, 5:18 PM  Recent Labs  Lab 12/15/22 0500 12/15/22 0831 12/18/22 0322 12/18/22 0323 12/19/22 0332  HGB  --    < >  --  7.6* 7.5*  ALBUMIN 2.5*  --   --   --  2.3*  CALCIUM 8.5*   < >  --  8.5* 8.2*  PHOS 2.3*  --  2.7  --   --   CREATININE 5.16*   < >  --  5.02* 3.63*  K 3.4*   < >  --  3.6 3.3*   < > = values in this interval not displayed.   No results for input(s): "IRON", "TIBC", "FERRITIN" in the last 168 hours. Inpatient medications:  [START ON 12/21/2022] calcitRIOL  0.75 mcg Oral Q M,W,F   Chlorhexidine Gluconate Cloth  6 each Topical Daily   [START ON 12/20/2022] cinacalcet  30 mg Oral Q breakfast   [START ON 12/21/2022] darbepoetin (ARANESP) injection - DIALYSIS  200 mcg Subcutaneous Q Mon-1800   heparin injection (subcutaneous)  5,000 Units Subcutaneous Q8H   midodrine  20 mg Oral Q8H   pantoprazole  40 mg Oral BID   pregabalin  75 mg Oral Daily    sodium chloride     albumin human 25 g (12/19/22 1454)   norepinephrine (LEVOPHED) Adult infusion Stopped (12/19/22 1324)   acetaminophen, docusate sodium, polyethylene glycol

## 2022-12-19 NOTE — Progress Notes (Signed)
NAME:  Justin [name], MRN:  [id number], DOB:  09/28/1945, LOS: 5 ADMISSION DATE:  12/13/2022, CONSULTATION DATE:  12/14/22 REFERRING MD:  Blinda Leatherwood , CHIEF COMPLAINT:  GIB   History of Present Illness:  77 yo male with hx of A fib on eliquis and rectal AVM with recurrent GI bleeding presented from NH with rectal bleeding.  Developed hypotension.  Received PRBC and Kcentra.  Started on pressors.  PCCM asked to admit to ICU.  Pertinent  Medical History  ESRD, A fib, Rectal AVM, Chronic hypotension, Moderate aortic stenosis, Prostate cancer, DM type 2, DM retinopathy and peripheral neuropathy, Gout, HTN  Significant Hospital Events: Including procedures, antibiotic start and stop dates in addition to other pertinent events   3/07 Colonoscopy >> cecal polyp not removed, solitary ulcer in proximal transverse colon from prior polypectomy 3 clips placed, solitary ulcer mid transverse colon 2 clips placed, few polyps in transverse colon, diverticulosis in transverse colon and Lt colon, blood in rectum and recto-sigmoid colon, one 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 6/03 progressive hem shock. 3 prbc, kcentra, CVC placed and on pressors. 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 rate 16, height 5'  10" (1.778 m), weight 83.8 kg, SpO2 100 %.        Intake/Output Summary (Last 24 hours) at 12/19/2022 1002 Last data filed at 12/19/2022 0900 Gross per 24 hour  Intake 285.85 ml  Output 2600 ml  Net -2314.15 ml    Filed Weights   12/18/22 1315 12/18/22 1755 12/19/22 0341  Weight: 86.5 kg 84 kg 83.8 kg    Examination: General: chronically ill adult male, sitting up in ICU bed  HENT: Normocephalic, Poor dentition, Pink Moist MM  Cardio: s1, s2, auscultated, RRR, no m/r/g Pulm: CTA to diminished, no respiratory distress  Abd: BS active GU: anuric, intact  Skin: warm, dry, unstagable pressure injuries on heels BL with foam dressing covering  Extremities: non pitting edema Neuro: Alert and oriented x4 , follows commands, PERRLA intact   Resolved Hospital Problem list     Assessment & Plan:   Acute lower GI bleed with history of radiation proctitis History of distal rectum and showed dysplastic lesion and rectal AVM Colonoscopy 6/7- A polyp was noted in the cecum EGD 6/6-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  P:  Continue to monitor for signs of bleeding Continue to trend H/H  Continue to MAP goal of >60 instead of MAP >65   Hypotension Hx  At baseline BP borderline low on home midodrine, hemodynamics complicated by Acute lower GI bleed Has received 2 units of PRBCs, plus 1 on FFP  Hemodynamically stable  P:  Continue to trend H?H Transfuse if hgb <7  Will give albumin 12.5 g q 6hrs  Will continue midodrine 20mg  TID  Can consider transfusion if hypotension persist, will continue to follow labs  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 Aortic stenosis Chronic hypotension on midodrine with hemodialysis P:  Continue to hold Eliquis Add heparin sbq   End-stage renal disease Received HD 6/8  P:  Continue to receive HD  Supportive care   Acute cholecystitis s/p percutaneous cholecystostomy tube Has appointment to follow-up with [facility] in July Drain was not sustained  12/01/2022 P: Continue supportive care   Type 2 diabetes P: Continue SSI Monitor CBG ACHS  Continue carb mod/renal diet   Peripheral diabetic neuropathy P: Continue lyrica   Multiple pressure injuries present on admission Unstagable pressure injuries on heels BL, black eschar  Having pain with wounds P: Will add wound care consult  Continue supportive care, foam dressings for now  Will add toradol prn   Best Practice (right click and "Reselect all SmartList Selections" daily)   Diet/type: Renal diet  DVT prophylaxis: SCD GI prophylaxis: PPI Lines: Central line Foley:  N/A Code Status:  DNR Last date of multidisciplinary goals of care discussion [patient updated at bedside]

## 2022-12-20 ENCOUNTER — Encounter (HOSPITAL_COMMUNITY): Payer: Self-pay | Admitting: Gastroenterology

## 2022-12-20 DIAGNOSIS — N186 End stage renal disease: Secondary | ICD-10-CM | POA: Diagnosis not present

## 2022-12-20 DIAGNOSIS — K922 Gastrointestinal hemorrhage, unspecified: Secondary | ICD-10-CM | POA: Diagnosis not present

## 2022-12-20 DIAGNOSIS — R578 Other shock: Secondary | ICD-10-CM | POA: Diagnosis not present

## 2022-12-20 LAB — GLUCOSE, CAPILLARY
Glucose-Capillary: 78 mg/dL (ref 70–99)
Glucose-Capillary: 91 mg/dL (ref 70–99)
Glucose-Capillary: 113 mg/dL — ABNORMAL HIGH (ref 70–99)
Glucose-Capillary: 96 mg/dL (ref 70–99)
Glucose-Capillary: 104 mg/dL — ABNORMAL HIGH (ref 70–99)

## 2022-12-20 LAB — CBC
HCT: 22.1 % — ABNORMAL LOW (ref 39.0–52.0)
Hemoglobin: 7 g/dL — ABNORMAL LOW (ref 13.0–17.0)
MCH: 30.3 pg (ref 26.0–34.0)
MCHC: 31.7 g/dL (ref 30.0–36.0)
MCV: 95.7 fL (ref 80.0–100.0)
Platelets: 124 10*3/uL — ABNORMAL LOW (ref 150–400)
RBC: 2.31 MIL/uL — ABNORMAL LOW (ref 4.22–5.81)
RDW: 17.9 % — ABNORMAL HIGH (ref 11.5–15.5)
WBC: 6.1 10*3/uL (ref 4.0–10.5)
nRBC: 0 % (ref 0.0–0.2)

## 2022-12-20 LAB — TYPE AND SCREEN
Antibody Screen: NEGATIVE
Unit division: 0

## 2022-12-20 LAB — COMPREHENSIVE METABOLIC PANEL
ALT: 10 U/L (ref 0–44)
AST: 16 U/L (ref 15–41)
Albumin: 2.7 g/dL — ABNORMAL LOW (ref 3.5–5.0)
Alkaline Phosphatase: 38 U/L (ref 38–126)
Anion gap: 7 (ref 5–15)
BUN: 9 mg/dL (ref 8–23)
CO2: 28 mmol/L (ref 22–32)
Calcium: 8.6 mg/dL — ABNORMAL LOW (ref 8.9–10.3)
Chloride: 102 mmol/L (ref 98–111)
Creatinine, Ser: 5.03 mg/dL — ABNORMAL HIGH (ref 0.61–1.24)
GFR, Estimated: 11 mL/min — ABNORMAL LOW (ref >60)
Glucose, Bld: 88 mg/dL (ref 70–99)
Potassium: 3.7 mmol/L (ref 3.5–5.1)
Sodium: 137 mmol/L (ref 135–145)
Total Bilirubin: 0.7 mg/dL (ref 0.3–1.2)
Total Protein: 5.3 g/dL — ABNORMAL LOW (ref 6.5–8.1)

## 2022-12-20 LAB — BPAM RBC
Blood Product Expiration Date: 202407112359
Unit Type and Rh: 5100

## 2022-12-20 MED ORDER — DOCUSATE SODIUM 100 MG PO CAPS
100.0000 mg | ORAL_CAPSULE | Freq: Two times a day (BID) | ORAL | Status: DC
  Administered 2022-12-20 – 2022-12-22 (×3): 100 mg via ORAL
  Filled 2022-12-20 (×3): qty 1

## 2022-12-20 MED ORDER — CHLORHEXIDINE GLUCONATE CLOTH 2 % EX PADS
6.0000 | MEDICATED_PAD | Freq: Every day | CUTANEOUS | Status: DC
  Administered 2022-12-20: 6 via TOPICAL

## 2022-12-20 MED ORDER — SODIUM CHLORIDE 0.9% IV SOLUTION: Freq: Once | INTRAVENOUS | Status: DC

## 2022-12-20 MED ORDER — POTASSIUM CHLORIDE CRYS ER 20 MEQ PO TBCR
20.0000 meq | EXTENDED_RELEASE_TABLET | Freq: Once | ORAL | Status: AC
  Administered 2022-12-20: 20 meq via ORAL
  Filled 2022-12-20: qty 1

## 2022-12-20 NOTE — Progress Notes (Signed)
[facility] Progress Note  Subjective: weaned off levo gtt yesterday am. No c/o's this am.   Vitals:   12/20/22 0630 12/20/22 0635 12/20/22 0638 12/20/22 0730  BP: (!) 75/46 (!) 84/58 (!) 84/38   Pulse: 91 87 85   Resp: 16 14 16    Temp:    (!) 97.5 F (36.4 C)  TempSrc:    Oral  SpO2: 96% 100% 100%   Weight:      Height:        Exam: Gen: NAD CVS: Tachy, III/VI SEM LUSB Resp: CTA Abd:+BS,soft, NT/ND Ext: no edema, LUE AVF +T/B   Home meds - lipitor, nephrovite, , sensipar 30mg  po qd, eliquis, midodrine 15mg  tid, protonix, lyrica, trazodone, prns/ vits/ supps   [facility] 3h  450/600  91.6kg  2/2.5 bath  LUA AVF  Hep none - coming in under dry wt around 89kg last 2 sessions - rocaltrol 0.75 mcg po tiw - venofer 50 iv weekly - mircera 200 mcg IV q 2 wks, last 5/27, due 6/10 - last Hb 9.6 on 5/29   Assessment/Plan:  Hemorrhagic shock - remains on low-dose pressors weaned off yesterday, also getting po midodrine 20 tid.  S/P 3 units PRBC's.  CT angio negative for active bleed.  Eliquis on hold for now.  Had recurrent rectal bleeding/ hematochezia on 12/15/22, seen by GI and had EGD/ colonoscopy on 6/07.  EGD negative, colonoscopy with rectal ulcer s/p clip.  Hgb in mid 7's and stable.  Transfuse prn.    ESRD - usual HD MWF. Last HD was Friday, plan next HD tomorrow.   Hypertension/volume  -  was 7kg under and possibly too dry so we gave IVF"s overnight x 1 L . Wt's up a bit, not sure BP's were helped any though. Will keep even w/ next HD.   Anemia  - hb 7-8 range here. +ABLA on top of anemia of ESRD.  S/p blood transfusions x 3.  Last OP esa was given 5/27, so would be due on 6/10. Have ordered weekly sq darbe 200 mcg to start on Monday.   Metabolic bone disease -  CCa and phos are in range, will resume po rocaltrol w/ hd tiw and resume sensipar 30mg  qd as at home.   Nutrition -  renal diet, carb modified.  A fib/flutter - eliquis on hold at this time as  risks outweigh benefits.    Acute cholecystitis s/p percutaneous cholecystostomy tube - from OSH 4/2- 4/23 and felt to be too high risk for surgical intervention.  Drain exchanged 12/01/22 and f/u with surgery at [facility] in July.  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  Currently DNR.  May benefit from ongoing discussions with palliative care for possible transition to comfort if he were to deteriorate further       Vinson Moselle MD [facility] 12/20/2022, 10:46 AM  Recent Labs  Lab 12/15/22 0500 12/15/22 0831 12/18/22 0322 12/18/22 0323 12/19/22 0332 12/20/22 0312  HGB  --    < >  --    < > 7.5* 7.0*  ALBUMIN 2.5*  --   --   --  2.3* 2.7*  CALCIUM 8.5*   < >  --    < > 8.2* 8.6*  PHOS 2.3*  --  2.7  --   --   --   CREATININE 5.16*   < >  --    < >  3.63* 5.03*  K 3.4*   < >  --    < > 3.3* 3.7   < > = values in this interval not displayed.   No results for input(s): "IRON", "TIBC", "FERRITIN" in the last 168 hours. Inpatient medications:  [START ON 12/21/2022] calcitRIOL  0.75 mcg Oral Q M,W,F   Chlorhexidine Gluconate Cloth  6 each Topical Daily   cinacalcet  30 mg Oral Q breakfast   [START ON 12/21/2022] darbepoetin (ARANESP) injection - DIALYSIS  200 mcg Subcutaneous Q Mon-1800   midodrine  20 mg Oral Q8H   pantoprazole  40 mg Oral BID   pregabalin  75 mg Oral Daily    norepinephrine (LEVOPHED) Adult infusion Stopped (12/19/22 1324)   acetaminophen, docusate sodium, polyethylene glycol

## 2022-12-20 NOTE — Progress Notes (Signed)
NAME:  LAMARE ANTES, MRN:  [id number], DOB:  04-Mar-1946, LOS: 6 ADMISSION DATE:  12/13/2022, CONSULTATION DATE:  12/14/22 REFERRING MD:  Blinda Leatherwood , CHIEF COMPLAINT:  GIB   History of Present Illness:  77 yo male with hx of A fib on eliquis and rectal AVM with recurrent GI bleeding presented from NH with rectal bleeding.  Developed hypotension.  Received PRBC and Kcentra.  Started on pressors.  PCCM asked to admit to ICU.  Pertinent  Medical History  ESRD, A fib, Rectal AVM, Chronic hypotension, Moderate aortic stenosis, Prostate cancer, DM type 2, DM retinopathy and peripheral neuropathy, Gout, HTN  Significant Hospital Events: Including procedures, antibiotic start and stop dates in addition to other pertinent events   3/07 Colonoscopy >> cecal polyp not removed, solitary ulcer in proximal transverse colon from prior polypectomy 3 clips placed, solitary ulcer mid transverse colon 2 clips placed, few polyps in transverse colon, diverticulosis in transverse colon and Lt colon, blood in rectum and recto-sigmoid colon, 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 6/03 progressive hem shock. 3 prbc, kcentra, CVC placed and on pressors. 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 rate 16, height 5\' 10"  (1.778 m), weight 86.8 kg, SpO2 100 %.        Intake/Output Summary (Last 24 hours) at 12/20/2022 1050 Last data filed at 12/20/2022 0516 Gross per 24 hour  Intake 1290.89 ml  Output 75 ml  Net 1215.89 ml    Filed Weights   12/18/22 1755 12/19/22 0341 12/20/22 0318  Weight: 84 kg 83.8 kg 86.8 kg    Examination: General: chronically ill appearing adult male, sitting up in ICU bed  HENT: Normocephalic, EOM intact, Pink Moist MM, poor dentition  Cardio: s1, s2, auscultated, RRR, no m/r/g Pulm: CTA to diminished, no respiratory distress  Abd: BS active  GU: intact, anuric  Skin: warm, dry, unstageable pressure wounds on ankles BL  Extremities: no edema  Neuro: Alert and oriented x4    Resolved Hospital Problem list     Assessment & Plan:   Acute lower GI bleed with history of radiation proctitis History of distal rectum and showed dysplastic lesion and rectal AVM Colonoscopy 6/7- A polyp was noted in the cecum EGD 6/6-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 Hgb 7.0 Off pressors  P:  Continue to monitor for signs of bleeding Continue to trend h/h  Continue SBP goal >85 Will transfuse 1unit of PRBCs  Hypotension Hx  At baseline BP borderline low on home midodrine, hemodynamics complicated by Acute lower GI bleed Has received 2 units of PRBCs, plus 1 on FFP  Still remains hypotensives off pressors, but not symptomatic  P:  Continue to trend H/H Will Transfuse 1 unit of PRBCs Will continue midodrine 20mg  TID  Atrial for flutter  on Eliquis Aortic stenosis Chronic hypotension on midodrine with hemodialysis P:  Do not restart eliquis due to bleeding, patient is not a candidate due to multiple bleeds, hemorrhagic episodes  Continue SBQ heparin  End-stage renal disease Received HD 6/8  P:  Continue to receive HD, will receive tomorrow Supportive care   Acute cholecystitis s/p percutaneous cholecystostomy tube Has  appointment to follow-up with [facility] in July Drain was not sustained 12/01/2022 P: Continue supportive care   Type 2 diabetes P: Continue SSI Monitor CBG ACHS Continue Carb mod/renal diet   Peripheral diabetic neuropathy P: Continue lyrica   Multiple pressure injuries present on admission Unstagable pressure injuries on heels BL, black eschar  Having pain with wounds, pain controlled with toradol  P: Wound consult Continue supportive care, foam dressings for now, off load pressure Continue toradol prn, will receive HD tomorrow   Best Practice (right click and "Reselect all SmartList Selections" daily)   Diet/type: Renal diet  DVT prophylaxis: SCD GI prophylaxis: PPI Lines: Central line Foley:  N/A Code Status:  DNR Last date of multidisciplinary goals of care discussion [patient updated at bedside]

## 2022-12-21 DIAGNOSIS — R578 Other shock: Secondary | ICD-10-CM | POA: Diagnosis not present

## 2022-12-21 LAB — TYPE AND SCREEN: ABO/RH(D): O POS

## 2022-12-21 LAB — CBC
HCT: 27.8 % — ABNORMAL LOW (ref 39.0–52.0)
Hemoglobin: 8.7 g/dL — ABNORMAL LOW (ref 13.0–17.0)
MCH: 29.9 pg (ref 26.0–34.0)
MCHC: 31.3 g/dL (ref 30.0–36.0)
MCV: 95.5 fL (ref 80.0–100.0)
Platelets: 167 10*3/uL (ref 150–400)
RBC: 2.91 MIL/uL — ABNORMAL LOW (ref 4.22–5.81)
RDW: 17.2 % — ABNORMAL HIGH (ref 11.5–15.5)
WBC: 7.4 10*3/uL (ref 4.0–10.5)
nRBC: 0 % (ref 0.0–0.2)

## 2022-12-21 LAB — RENAL FUNCTION PANEL
Albumin: 2.9 g/dL — ABNORMAL LOW (ref 3.5–5.0)
Anion gap: 9 (ref 5–15)
BUN: 14 mg/dL (ref 8–23)
CO2: 26 mmol/L (ref 22–32)
Calcium: 8.7 mg/dL — ABNORMAL LOW (ref 8.9–10.3)
Chloride: 101 mmol/L (ref 98–111)
Creatinine, Ser: 6.55 mg/dL — ABNORMAL HIGH (ref 0.61–1.24)
GFR, Estimated: 8 mL/min — ABNORMAL LOW (ref >60)
Glucose, Bld: 92 mg/dL (ref 70–99)
Phosphorus: 2.8 mg/dL (ref 2.5–4.6)
Potassium: 4.1 mmol/L (ref 3.5–5.1)
Sodium: 136 mmol/L (ref 135–145)

## 2022-12-21 LAB — GLUCOSE, CAPILLARY
Glucose-Capillary: 87 mg/dL (ref 70–99)
Glucose-Capillary: 88 mg/dL (ref 70–99)
Glucose-Capillary: 95 mg/dL (ref 70–99)

## 2022-12-21 LAB — MAGNESIUM: Magnesium: 2.1 mg/dL (ref 1.7–2.4)

## 2022-12-21 LAB — BPAM RBC: ISSUE DATE / TIME: 202406091321

## 2022-12-21 MED ORDER — ORAL CARE MOUTH RINSE: 15.0000 mL | OROMUCOSAL | Status: DC | PRN

## 2022-12-21 MED ORDER — CHLORHEXIDINE GLUCONATE CLOTH 2 % EX PADS
6.0000 | MEDICATED_PAD | CUTANEOUS | Status: DC
  Administered 2022-12-21 – 2022-12-26 (×3): 6 via TOPICAL

## 2022-12-21 NOTE — Consult Note (Signed)
WOC Nurse Consult Note: Reason for Consult:bilateral heel pressure injuries From SNF; history prostate/colon CA, DM, ESRD on HD with rectal bleeding. Shock Wound type: Unstageable Pressure Injury; right heel; 100% black stable eschar Unstageable Pressure Injury: left heel: 100% black stable eschar  Pressure Injury POA: Yes Measurement: see nursing notes  Wound bed: 100% black eschar  Drainage (amount, consistency, odor) none Periwound:intact  Dressing: paint with betadine daily to keep stable; offload at all times.   Prevalon boots bilaterally   Re consult if needed, will not follow at this time. Thanks  Ramiel Forti [employer], RN,CWOCN, CNS, CWON-AP [phone])

## 2022-12-21 NOTE — Progress Notes (Signed)
[facility] Progress Note  Subjective: pt moved out of ICU now. Seen in dialysis. BP's better, over 100 this am. Keeping even w/ HD today.   Vitals:   12/21/22 1000 12/21/22 1030 12/21/22 1100 12/21/22 1132  BP: 96/65 118/60 96/69 112/72  Pulse: (!) 114 (!) 113 (!) 112 (!) 108  Resp: 16 (!) 22 17 15   Temp:      TempSrc:      SpO2: 100% 100% 100% 100%  Weight:      Height:        Exam: Gen: NAD CVS: Tachy, III/VI SEM LUSB Resp: CTA Abd:+BS,soft, NT/ND Ext: no edema, LUE AVF +T/B   Home meds - lipitor, nephrovite, , sensipar 30mg  po qd, eliquis, midodrine 15mg  tid, protonix, lyrica, trazodone, prns/ vits/ supps   [facility] HD: [facility] 3h  450/600  91.6kg  2/2.5 bath  LUA AVF  Hep none - coming in under dry wt around 89kg last 2 sessions - rocaltrol 0.75 mcg po tiw - venofer 50 iv weekly - mircera 200 mcg IV q 2 wks, last 5/27, due 6/10 - last Hb 9.6 on 5/29   Assessment/Plan:  Hemorrhagic shock - off pressors x 36 hrs. Cont po midodrine 20 tid.  S/P 3 units PRBC's.  CT angio negative for active bleed.  Eliquis on hold for now.  Had recurrent rectal bleeding/ hematochezia on 12/15/22, seen by GI and had EGD/ colonoscopy on 6/07.  EGD negative, colonoscopy with rectal ulcer s/p clip.  Hgb was in the 7's, he got 1 more unit prbc's yesterday 6/09, Hb up to 8.7 today. Stable. Transfuse prn.    ESRD - usual HD MWF. HD today on schedule.   Hypertension/volume  -  was 7kg under dry wt so we gave IVF"s the last 2 days to help get volume and BP up. Wt's are up at 87kg and BP's better. Keep even w/ HD today.   Anemia  - hb 7-8 range here. +ABLA on top of anemia of ESRD.  S/p blood transfusions x 3.  Last OP esa was given 5/27, so would be due on 6/10. Have ordered weekly sq darbe 200 mcg to start on Monday.   Metabolic bone disease -  CCa and phos are in range. Continues his home meds  here w/ rocaltrol and sensipar.   Nutrition -  renal diet, carb modified.  A fib/flutter -  eliquis on hold at this time as risks outweigh benefits.    Acute cholecystitis s/p percutaneous cholecystostomy tube - from OSH 4/2- 4/23 and felt to be too high risk for surgical intervention.  Drain exchanged 12/01/22 and f/u with surgery at [facility] in July.  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  Currently DNR.  May benefit from ongoing discussions with palliative care for possible transition to comfort if he were to deteriorate further       Vinson Moselle MD [facility] 12/21/2022, 11:49 AM  Recent Labs  Lab 12/18/22 0322 12/18/22 0323 12/20/22 0312 12/21/22 0536  HGB  --    < > 7.0* 8.7*  ALBUMIN  --    < > 2.7* 2.9*  CALCIUM  --    < > 8.6* 8.7*  PHOS 2.7  --   --  2.8  CREATININE  --    < > 5.03* 6.55*  K  --    < > 3.7 4.1   < > =  values in this interval not displayed.    No results for input(s): "IRON", "TIBC", "FERRITIN" in the last 168 hours. Inpatient medications:  sodium chloride   Intravenous Once   calcitRIOL  0.75 mcg Oral Q M,W,F   Chlorhexidine Gluconate Cloth  6 each Topical Daily   cinacalcet  30 mg Oral Q breakfast   darbepoetin (ARANESP) injection - DIALYSIS  200 mcg Subcutaneous Q Mon-1800   docusate sodium  100 mg Oral BID   midodrine  20 mg Oral Q8H   pantoprazole  40 mg Oral BID   pregabalin  75 mg Oral Daily     acetaminophen, docusate sodium, mouth rinse, polyethylene glycol

## 2022-12-21 NOTE — Hospital Course (Addendum)
Justin [name] is a 77 y.o. male with a history of atrial fibrillation on Eliquis, rectal AVM with recurrent GI bleeding, ESRD on hemodialysis, anemia, chronic hypotension, moderate aortic stenosis.  Patient presented secondary to rectal bleeding with associated worsening hypotension.  Patient required initial transfusion of 3 units of PRBC in addition to [facility] and was started on vasopressor support to manage hypotension from hemorrhagic shock.  Patient admitted to ICU on admission.  While in ICU, patient was evaluated by gastroenterology. 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  Vasopressors weaned off and patient transferred to the hospitalist service on 6/10. On 6/11, patient developed recurrent severe hypotension requiring Levophed and transfer back to ICU; unknown etiology. Patient transferred back again to the hospitalist service on 6/12. Patient tolerated physical therapy prior to discharge.

## 2022-12-21 NOTE — Progress Notes (Signed)
PT Cancellation Note  Patient Details Name: Justin [name] MRN: [id number] DOB: [birth year]   Cancelled Treatment:    Reason Eval/Treat Not Completed: Patient at procedure or test/unavailable. Pt in HD. PT to re-attempt as time allows.   Ilda Foil 12/21/2022, 9:00 AM

## 2022-12-21 NOTE — Progress Notes (Signed)
PROGRESS NOTE    Justin [name]  WUJ:[id number] DOB: 1945/11/27 DOA: 12/13/2022 PCP: [name], DO   Brief Narrative: Justin [name] is a 77 y.o. male with a history of atrial fibrillation on Eliquis, rectal AVM with recurrent GI bleeding, ESRD on hemodialysis, anemia, chronic hypotension, moderate aortic stenosis.  Patient presented secondary to rectal bleeding with associated worsening hypotension.  Patient required initial transfusion of 3 units of PRBC in addition to [facility] and was started on vasopressor support to manage hypotension from hemorrhagic shock.  Patient admitted to ICU on admission.  While in ICU, patient was evaluated by gastroenterology. 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  Vasopressors weaned off and patient transferred to the hospital service on 6/10.   Assessment and Plan:  Acute lower GI bleed Present on admission.  Complicated by Eliquis use.  [facility] was consulted and performed upper endoscopy and colonoscopy. 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 GI bleeding appears to be resolved.  Hemorrhagic shock Secondary to GI bleed and complicated by Eliquis use and underlying baseline hypotension. Patient required ICU admission 6/3 for vasopressor support. Patient managed on Levophed which was successfully weaned off on 6/7 with management of GI bleeding. Resolved.  Acute on chronic anemia Baseline creatinine appears to be around 8. Acute anemia secondary to acute hemorrhage. Patient has received 3 units of PRBC while admitted. Hemoglobin stable.  Hypotension Chronic. Patient is managed on midodrine as an  outpatient. Complicated by acute GI bleeding and need for vasopressor support this admission. -Continue midodrine  Atrial flutter/atrial fibrillation, unspecified Chronic issue. Patient is not on antiarrhythmia or rate control medication. He is managed on Eliquis as an outpatient which is now discontinued secondary to high risk of bleed with significant GI bleeding. 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  Aortic stenosis Moderate stenosis noted on most recent Transthoracic Echocardiogram from 5/14.   ESRD on HD Nephrology consulted. Patient is receiving hemodialysis while inpatient.  History of acute cholecystitis Recent. S/p percutaneous cholecystostomy tube placed in April of 2024 and still present.  Diabetes mellitus type 2 Controlled. Hemoglobin A1C of 5.1%. patient is not on medication management.  Diabetic neuropathy -Continue Lyrica 75 mg daily  GERD -Continue Protonix  Pressure injury Left posterior heel, right heel. Present on admission.    DVT prophylaxis: SCDs Code Status:   Code Status: DNR Family Communication: None at bedside Disposition Plan: Discharge likely to SNF pending bed availability and stable blood pressure.   Consultants:  PCCM [facility]  Procedures:  6/6: Colonoscopy; Upper GI endoscopy  Antimicrobials: None    Subjective: Patient reports no issues overnight. Hoping to start working with physical therapy.  Objective: BP 96/70   Pulse (!) 112   Temp 98.6 F (37 C) (Oral)   Resp 19   Ht 5\' 10"  (1.778 m)   Wt 87.1 kg   SpO2 100%   BMI 27.55 kg/m   Examination:  General exam: Appears calm and comfortable Respiratory system: Clear to auscultation. Respiratory effort normal. Cardiovascular system: S1 & S2 heard. 2/6 systolic murmur. Gastrointestinal  system: Abdomen is nondistended, soft and nontender. Normal bowel sounds heard. Central nervous system: Alert and oriented. No focal neurological deficits. Musculoskeletal: No calf tenderness Skin: No cyanosis. No rashes Psychiatry: Judgement and insight appear normal. Mood & affect appropriate.    Data Reviewed: I have personally reviewed following labs and imaging studies  CBC Lab Results  Component Value Date   WBC 7.4 12/21/2022   RBC 2.91 (L) 12/21/2022   HGB 8.7 (L) 12/21/2022   HCT 27.8 (L) 12/21/2022   MCV 95.5 12/21/2022   MCH 29.9 12/21/2022   PLT 167 12/21/2022   MCHC 31.3 12/21/2022   RDW 17.2 (H) 12/21/2022   LYMPHSABS 0.9 11/23/2022   MONOABS 1.1 (H) 11/23/2022   EOSABS 0.1 11/23/2022   BASOSABS 0.1 11/23/2022     Last metabolic panel Lab Results  Component Value Date   NA 136 12/21/2022   K 4.1 12/21/2022   CL 101 12/21/2022   CO2 26 12/21/2022   BUN 14 12/21/2022   CREATININE 6.55 (H) 12/21/2022   GLUCOSE 92 12/21/2022   GFRNONAA 8 (L) 12/21/2022   GFRAA 8 (L) 02/01/2020   CALCIUM 8.7 (L) 12/21/2022   PHOS 2.8 12/21/2022   PROT 5.3 (L) 12/20/2022   ALBUMIN 2.9 (L) 12/21/2022   LABGLOB 3.7 10/01/2015   AGRATIO 0.9 10/01/2015   BILITOT 0.7 12/20/2022   ALKPHOS 38 12/20/2022   AST 16 12/20/2022   ALT 10 12/20/2022   ANIONGAP 9 12/21/2022    GFR: Estimated Creatinine Clearance: 9.6 mL/min (A) (by C-G formula based on SCr of 6.55 mg/dL (H)).  Recent Results (from the past 240 hour(s))  MRSA Next Gen by PCR, Nasal     Status: None   Collection Time: 12/14/22  7:00 AM   Specimen: Nasal Mucosa; Nasal Swab  Result Value Ref Range Status   MRSA by PCR Next Gen NOT DETECTED NOT DETECTED Final    Comment: (NOTE) The GeneXpert MRSA Assay (FDA approved for NASAL specimens only), is one component of a comprehensive MRSA colonization surveillance program. It is not intended to diagnose MRSA infection nor to guide or monitor treatment for MRSA  infections. Test performance is not FDA approved in patients less than 37 years old. Performed at [facility], [address] [place], [place] [address]       Radiology Studies: No results found.    LOS: 7 days    Jacquelin Hawking, MD [facility] 12/21/2022, 6:44 AM   If 7PM-7AM, please contact night-coverage [email or web address]

## 2022-12-21 NOTE — Progress Notes (Signed)
   12/21/22 1248  Vitals  Temp 98.2 F (36.8 C)  Pulse Rate (!) 112  Resp 16  BP 115/70  SpO2 100 %  Post Treatment  Dialyzer Clearance Heavily streaked  Duration of HD Treatment -hour(s) 3.5 hour(s)  Hemodialysis Intake (mL) 0 mL  Liters Processed 74.1  Fluid Removed (mL) 0 mL  Tolerated HD Treatment Yes  AVG/AVF Arterial Site Held (minutes) 8 minutes  AVG/AVF Venous Site Held (minutes) 8 minutes   Received patient in bed to unit.  Alert and oriented.  Informed consent signed and in chart.   TX duration:3.5hrs  Patient tolerated well.  Transported back to the room  Alert, without acute distress.  Hand-off given to patient's nurse.   Access used: LUA AVF Access issues: none  Total UF removed: 0  Medication(s) given: none    [facility]y [facility]

## 2022-12-21 NOTE — Progress Notes (Signed)
Pt. Refusing blood sugar checks. MD made aware.

## 2022-12-22 DIAGNOSIS — R579 Shock, unspecified: Secondary | ICD-10-CM | POA: Diagnosis not present

## 2022-12-22 DIAGNOSIS — R578 Other shock: Secondary | ICD-10-CM | POA: Diagnosis not present

## 2022-12-22 DIAGNOSIS — N186 End stage renal disease: Secondary | ICD-10-CM | POA: Diagnosis not present

## 2022-12-22 DIAGNOSIS — K922 Gastrointestinal hemorrhage, unspecified: Secondary | ICD-10-CM | POA: Diagnosis not present

## 2022-12-22 LAB — TROPONIN I (HIGH SENSITIVITY)
Troponin I (High Sensitivity): 18 ng/L — ABNORMAL HIGH (ref <18)
Troponin I (High Sensitivity): 17 ng/L (ref <18)

## 2022-12-22 LAB — CBC
HCT: 28.3 % — ABNORMAL LOW (ref 39.0–52.0)
HCT: 28.3 % — ABNORMAL LOW (ref 39.0–52.0)
Hemoglobin: 9 g/dL — ABNORMAL LOW (ref 13.0–17.0)
Hemoglobin: 9 g/dL — ABNORMAL LOW (ref 13.0–17.0)
MCH: 30.1 pg (ref 26.0–34.0)
MCH: 30.9 pg (ref 26.0–34.0)
MCHC: 31.8 g/dL (ref 30.0–36.0)
MCHC: 31.8 g/dL (ref 30.0–36.0)
MCV: 94.6 fL (ref 80.0–100.0)
MCV: 97.3 fL (ref 80.0–100.0)
Platelets: 174 10*3/uL (ref 150–400)
Platelets: 184 10*3/uL (ref 150–400)
RBC: 2.99 MIL/uL — ABNORMAL LOW (ref 4.22–5.81)
RBC: 2.91 MIL/uL — ABNORMAL LOW (ref 4.22–5.81)
RDW: 16.8 % — ABNORMAL HIGH (ref 11.5–15.5)
RDW: 16.8 % — ABNORMAL HIGH (ref 11.5–15.5)
WBC: 7.6 10*3/uL (ref 4.0–10.5)
WBC: 7.5 10*3/uL (ref 4.0–10.5)
nRBC: 0 % (ref 0.0–0.2)
nRBC: 0 % (ref 0.0–0.2)

## 2022-12-22 LAB — PROTIME-INR
INR: 1.2 (ref 0.8–1.2)
Prothrombin Time: 15.8 seconds — ABNORMAL HIGH (ref 11.4–15.2)

## 2022-12-22 LAB — TYPE AND SCREEN
ABO/RH(D): O POS
Antibody Screen: NEGATIVE

## 2022-12-22 LAB — HEMOGLOBIN AND HEMATOCRIT, BLOOD
HCT: 29 % — ABNORMAL LOW (ref 39.0–52.0)
Hemoglobin: 9.2 g/dL — ABNORMAL LOW (ref 13.0–17.0)

## 2022-12-22 LAB — GLUCOSE, CAPILLARY
Glucose-Capillary: 74 mg/dL (ref 70–99)
Glucose-Capillary: 90 mg/dL (ref 70–99)
Glucose-Capillary: 84 mg/dL (ref 70–99)
Glucose-Capillary: 91 mg/dL (ref 70–99)
Glucose-Capillary: 85 mg/dL (ref 70–99)

## 2022-12-22 LAB — LACTIC ACID, PLASMA: Lactic Acid, Venous: 1 mmol/L (ref 0.5–1.9)

## 2022-12-22 MED ORDER — OCTREOTIDE LOAD VIA INFUSION
50.0000 ug | Freq: Once | INTRAVENOUS | Status: AC
  Administered 2022-12-22: 50 ug via INTRAVENOUS
  Filled 2022-12-22: qty 25

## 2022-12-22 MED ORDER — SODIUM CHLORIDE 0.9 % IV BOLUS
250.0000 mL | Freq: Once | INTRAVENOUS | Status: AC
  Administered 2022-12-22: 250 mL via INTRAVENOUS

## 2022-12-22 MED ORDER — SODIUM CHLORIDE 0.9 % IV SOLN
250.0000 mL | INTRAVENOUS | Status: DC
  Administered 2022-12-22: 250 mL via INTRAVENOUS

## 2022-12-22 MED ORDER — PANTOPRAZOLE INFUSION (NEW) - SIMPLE MED
8.0000 mg/h | INTRAVENOUS | Status: DC
  Administered 2022-12-22 – 2022-12-23 (×2): 8 mg/h via INTRAVENOUS
  Filled 2022-12-22 (×2): qty 100

## 2022-12-22 MED ORDER — NOREPINEPHRINE 4 MG/250ML-% IV SOLN: 2.0000 ug/min | INTRAVENOUS | Status: DC

## 2022-12-22 MED ORDER — NOREPINEPHRINE 4 MG/250ML-% IV SOLN
INTRAVENOUS | Status: AC
  Administered 2022-12-22: 2 ug/min via INTRAVENOUS
  Filled 2022-12-22: qty 250

## 2022-12-22 MED ORDER — PANTOPRAZOLE 80MG IVPB - SIMPLE MED
80.0000 mg | Freq: Once | INTRAVENOUS | Status: AC
  Administered 2022-12-22: 80 mg via INTRAVENOUS
  Filled 2022-12-22: qty 100

## 2022-12-22 MED ORDER — INSULIN ASPART 100 UNIT/ML IJ SOLN: 0.0000 [IU] | INTRAMUSCULAR | Status: DC

## 2022-12-22 MED ORDER — NOREPINEPHRINE 4 MG/250ML-% IV SOLN
INTRAVENOUS | Status: AC
  Filled 2022-12-22: qty 250

## 2022-12-22 MED ORDER — SODIUM CHLORIDE 0.9 % IV SOLN
50.0000 ug/h | INTRAVENOUS | Status: DC
  Administered 2022-12-22 – 2022-12-23 (×2): 50 ug/h via INTRAVENOUS
  Filled 2022-12-22 (×2): qty 1

## 2022-12-22 MED ORDER — CHLORHEXIDINE GLUCONATE CLOTH 2 % EX PADS
6.0000 | MEDICATED_PAD | Freq: Every day | CUTANEOUS | Status: DC
  Administered 2022-12-23: 6 via TOPICAL

## 2022-12-22 MED ORDER — PANTOPRAZOLE SODIUM 40 MG IV SOLR: 40.0000 mg | Freq: Two times a day (BID) | INTRAVENOUS | Status: DC

## 2022-12-22 MED ORDER — NOREPINEPHRINE 4 MG/250ML-% IV SOLN: 0.0000 ug/min | INTRAVENOUS | Status: DC

## 2022-12-22 NOTE — Progress Notes (Addendum)
NAME:  Justin [name], MRN:  [id number], DOB:  Jul 08, 1946, LOS: 8 ADMISSION DATE:  12/13/2022, CONSULTATION DATE:  12/14/22 REFERRING MD:  Blinda Leatherwood , CHIEF COMPLAINT:  GIB   History of Present Illness:  77 yo male with hx of A fib on eliquis and rectal AVM with recurrent GI bleeding presented from NH with rectal bleeding on 6/3.  Developed hypotension.  Received PRBC and Kcentra.  Started on pressors.  PCCM asked to admit to ICU. CT abd/pelvis negative for active bleeding GI consulted. 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 Patient bp and bleeding improving and transferred out of ICU on 6/9.   On 6/11, patient developed sudden hypotension and ams. Patient's bp 60/40. Ordered IV fluids. PCCM re-consulted.  Pertinent  Medical History  ESRD, A fib, Rectal AVM, Chronic hypotension, Moderate aortic stenosis, Prostate cancer, DM type 2, DM retinopathy and peripheral neuropathy, Gout, HTN  Significant Hospital Events: Including procedures, antibiotic start and stop dates in addition to other pertinent events   3/07 Colonoscopy >> cecal polyp not removed, solitary ulcer in proximal transverse colon from prior polypectomy 3 clips placed, solitary ulcer mid transverse colon 2 clips placed, few polyps in transverse colon, diverticulosis in transverse colon and Lt colon, blood in rectum and recto-sigmoid colon, one active 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 6/03 progressive hem shock. 3 prbc, kcentra, CVC placed and on pressors. PCCM to admit; CT angio GI bleed negative for active bleeding, iHD overnight 6/4 intermittently on pressors, stools dark> to hematochezia, GI consult 6/6-colonoscopy and  endoscopy-results noted 6/11 reconsulted for hypotension  Interim History / Subjective:   Patient Aox3 Denies any sob, chest pain No bloody bowel mvt's Bp 60/40  Objective   Blood pressure (!) 66/54, pulse (!) 110, temperature 98.3 F (36.8 C), temperature source Oral, resp. rate 19, height 5\' 10"  (1.778 m), weight 87.1 kg, SpO2 100 %.        Intake/Output Summary (Last 24 hours) at 12/22/2022 1741 Last data filed at 12/22/2022 1300 Gross per 24 hour  Intake 480 ml  Output 100 ml  Net 380 ml    Filed Weights   12/19/22 0341 12/20/22 0318 12/21/22 0421  Weight: 83.8 kg 86.8 kg 87.1 kg    Examination: General: chronically ill appearing HEENT: MM pink/moist Neuro: Aox3; MAE CV: s1s2, RRR, no m/r/g; bp 60/40 PULM:  dim clear BS bilaterally GI: soft, bsx4 active  Extremities: warm/dry, no edema  Skin: no rashes or lesions   Resolved Hospital Problem list     Assessment & Plan:    Shock: presumed hemorrhagic Acute lower GI bleed with 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 History of distal rectum and showed dysplastic lesion and rectal AVM Plan: -transfer to icu -check stat h/h, type and screen, la, bmp, trop -continue iv fluids -will start on levo -will change PPI po to IV bid and give ppi bolus -will start on octreotide -cont midodrine  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 Aortic stenosis Chronic hypotension on midodrine with hemodialysis P: -telemetry monitoring -cont to hold eliquis  End-stage renal disease on dialysis MWF P: -nephro following for HD needs  Hx of acute cholecystitis s/p percutaneous cholecystostomy tube P: -Has appointment  to follow-up with [facility] in July -perc chole tube in place since April 2024  DMT2 P: -ssi and cbg monitoring  Peripheral diabetic neuropathy P: -Continue Lyrica  Multiple pressure injuries POA P: -woc following  Best Practice (right click and "Reselect all SmartList Selections" daily)   Diet/type:  NPO DVT prophylaxis: SCD GI prophylaxis: PPI Lines: Central line Foley:  N/A Code Status:  DNR Last date of multidisciplinary goals of care discussion [6/11 sister updated over phone]  Labs       Latest Ref Rng & Units 12/21/2022    5:36 AM 12/20/2022    3:12 AM 12/19/2022    3:32 AM  CMP  Glucose 70 - 99 mg/dL 92  88  098   BUN 8 - 23 mg/dL 14  9  7    Creatinine 0.61 - 1.24 mg/dL 1.19  1.47  8.29   Sodium 135 - 145 mmol/L 136  137  134   Potassium 3.5 - 5.1 mmol/L 4.1  3.7  3.3   Chloride 98 - 111 mmol/L 101  102  96   CO2 22 - 32 mmol/L 26  28  30    Calcium 8.9 - 10.3 mg/dL 8.7  8.6  8.2   Total Protein 6.5 - 8.1 g/dL  5.3  5.5   Total Bilirubin 0.3 - 1.2 mg/dL  0.7  0.7   Alkaline Phos 38 - 126 U/L  38  49   AST 15 - 41 U/L  16  16   ALT 0 - 44 U/L  10  11        Latest Ref Rng & Units 12/22/2022    3:57 AM 12/21/2022    5:36 AM 12/20/2022    3:12 AM  CBC  WBC 4.0 - 10.5 K/uL 7.6  7.4  6.1   Hemoglobin 13.0 - 17.0 g/dL 9.0  8.7  7.0   Hematocrit 39.0 - 52.0 % 28.3  27.8  22.1   Platelets 150 - 400 K/uL 174  167  124     CBG (last 3)  Recent Labs    12/22/22 0722 12/22/22 1115 12/22/22 1619  GLUCAP 74 90 84    Critical care time: 35 minutes  JD Daryel [facility] 12/22/2022, 6:39 PM  Please see [email or web address] for pager details.  From 7A-7P if no response, please call [phone]. After hours, please call ELink [phone].

## 2022-12-22 NOTE — NC FL2 (Signed)
MEDICAID FL2 LEVEL OF CARE FORM     IDENTIFICATION  Patient Name: Justin [name] Birthdate: 1945/12/13 Sex: male Admission Date (Current Location): 12/13/2022  [facility] and [place] Number:  Producer, television/film/video and Address:  [facility], 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   Hemorrhagic shock (HCC) 12/14/2022   Acute encephalopathy 11/09/2022   Syncope 11/09/2022   Acute blood loss anemia 11/09/2022   Melena 11/09/2022   History of colonic polyps 11/09/2022   End stage renal disease (HCC) 11/09/2022   Gastric nodule 11/09/2022   Rectal bleeding 09/17/2022   Colon ulcer 09/17/2022   Lower GI bleeding 09/17/2022   GI bleed 09/16/2022   Prolonged QT interval 09/16/2022   History of prostate cancer 09/16/2022   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   History of colon polyps 09/16/2022   Lower GI bleed 09/12/2022   Acute GI bleeding 09/10/2022   Wide-complex tachycardia 06/17/2022   Hypotension 06/13/2022   Obesity (BMI 30-39.9) 06/13/2022   Perirectal abscess 06/13/2022   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   Murmur, cardiac 01/27/2022   Chronic back pain 01/27/2022   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/27/2022   GERD (gastroesophageal reflux disease) 11/11/2021   High risk medication use 11/11/2021   History of cardioversion 11/11/2021   Paroxysmal atrial fibrillation with RVR (HCC) 11/11/2021   PVC (premature ventricular  contraction) 11/11/2021   Nonrheumatic aortic valve stenosis 11/06/2021   Mixed hyperlipidemia 11/06/2021   Abdominal mass 05/05/2021   First degree AV block 07/13/2020   Paroxysmal A-fib (HCC) 01/29/2020   Wound infection 01/28/2020   H/O vitrectomy 12/07/2019   Macular pucker, 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 determined by examination associated with type 2 diabetes mellitus (HCC) 10/24/2019   Stable treated proliferative diabetic retinopathy of left eye determined by examination associated with type 2 diabetes mellitus (HCC) 10/24/2019   ESRD on hemodialysis (HCC) 07/29/2017   Anticoagulated 07/13/2017   Malignant neoplasm of prostate (HCC) 07/12/2017   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   Coagulation defect, unspecified (HCC) 01/08/2017   Idiopathic gout 01/08/2017   Acute on chronic blood loss anemia 11/20/2015   Blurred vision, left eye 07/10/2011   Essential hypertension 07/10/2011   DM (diabetes mellitus) (HCC) 07/10/2011    Orientation RESPIRATION BLADDER Height & Weight     Place, Situation, Time, Self  Normal Continent Weight: 192 lb 0.3 oz (87.1 kg) Height:  5\' 10"  (177.8 cm)  BEHAVIORAL SYMPTOMS/MOOD NEUROLOGICAL BOWEL NUTRITION STATUS      Continent Diet (see discharge summary)  AMBULATORY STATUS COMMUNICATION OF NEEDS Skin   Total Care Verbally PU Stage and Appropriate Care (L. Heel- unstageable; R. Heel- unstageable; L. Buttocks- Stage 2; L hip- incision)                       Personal Care Assistance Level of Assistance  Bathing, Feeding, Dressing  Bathing Assistance: Maximum assistance Feeding assistance: Limited assistance Dressing Assistance: Maximum assistance     Functional Limitations Info  Sight, Hearing, Speech Sight Info: Impaired Hearing Info: Adequate Speech Info: Adequate    SPECIAL CARE FACTORS FREQUENCY  PT (By licensed PT), OT (By licensed OT)     PT  Frequency: 5x/ week OT Frequency: 5x/ week            Contractures Contractures Info: Not present    Additional Factors Info  Code Status, Allergies, Psychotropic Code Status Info: DNR Allergies Info: NKA Psychotropic Info: see d/c med list         Current Medications (12/22/2022):  This is the current hospital active medication list Current Facility-Administered Medications  Medication Dose Route Frequency Provider Last Rate Last Admin   0.9 %  sodium chloride infusion (Manually program via Guardrails IV Fluids)   Intravenous Once Janeann Forehand D, NP       acetaminophen (TYLENOL) tablet 650 mg  650 mg Per Tube Q6H PRN Conrad [place], MD   650 mg at 12/15/22 0605   calcitRIOL (ROCALTROL) capsule 0.75 mcg  0.75 mcg Oral Q M,W,F Delano Metz, MD   0.75 mcg at 12/21/22 1702   Chlorhexidine Gluconate Cloth 2 % PADS 6 each  6 each Topical Daily Oretha Milch, MD   6 each at 12/22/22 0819   [START ON 12/23/2022] Chlorhexidine Gluconate Cloth 2 % PADS 6 each  6 each Topical Q0600 Delano Metz, MD       cinacalcet [facility]) tablet 30 mg  30 mg Oral Q breakfast Delano Metz, MD   30 mg at 12/22/22 0818   Darbepoetin Alfa (ARANESP) injection 200 mcg  200 mcg Subcutaneous Q Mon-1800 Delano Metz, MD   200 mcg at 12/21/22 1701   docusate sodium (COLACE) capsule 100 mg  100 mg Oral BID PRN Lanier Clam, NP       docusate sodium (COLACE) capsule 100 mg  100 mg Oral BID Cyril Mourning V, MD   100 mg at 12/22/22 0818   midodrine (PROAMATINE) tablet 20 mg  20 mg Oral Q8H Olalere, Adewale A, MD   20 mg at 12/22/22 1309   Oral care mouth rinse  15 mL Mouth Rinse PRN Oretha Milch, MD       pantoprazole (PROTONIX) EC tablet 40 mg  40 mg Oral BID Olalere, Adewale A, MD   40 mg at 12/22/22 0819   polyethylene glycol (MIRALAX / GLYCOLAX) packet 17 g  17 g Oral Daily PRN Lanier Clam, NP       pregabalin (LYRICA) capsule 75 mg  75 mg Oral Daily Selmer Dominion B, NP   75 mg at 12/22/22  0818     Discharge Medications: Please see discharge summary for a list of discharge medications.  Relevant Imaging Results:  Relevant Lab Results:   Additional Information SSN: [id number]. Dialysis at [facility] on MWF; arrival 6:10am; chair time 6:30am  Johnney Scarlata F Kimber Esterly, LCSW

## 2022-12-22 NOTE — Progress Notes (Signed)
Physical Therapy Treatment Patient Details Name: Justin [name] MRN: [id number] DOB: 1946-02-20 Today's Date: 12/22/2022   History of Present Illness Patient is a 77 y/o male admitted 12/14/22 with recurrent GIB, h/o a-fib on Eliquis, rectal AVM, ESRD on HD, chronic hypotension, mod AS, Prostate CA, DM, retinopathy, peripheral neuropathy, gout, HTN, admission 4/2-4/23 for cholecystitis, septic shock, NSTEMI, placed cholecystostomy tube, admission 5/1-5/14 for lower GIB/PNA.    PT Comments    Pt greeted resting in bed and agreeable to session with continued progress towards acute goals. Pt continues to be limited by bil foot pain and decreased sensation, global weakness, decreased activity tolerance and impaired balance/postural reactions. Session focused on progression of functional transfers with pt able to come to partial stand x1 and full upright standing x2 with mod A to power up and RW for support. Pt needing cues to extend UE for increased support and to elevate trunk in standing as pt with tendency for flexed posture. Pt able to march in place with cues for weight shift but unable to take steps away from EOB or along EOB. Pt putting forth good effort throughout session. Current plan remains appropriate to address deficits and maximize functional independence and decrease caregiver burden, with pt in agreement. Pt continues to benefit from skilled PT services to progress toward functional mobility goals.     Recommendations for follow up therapy are one component of a multi-disciplinary discharge planning process, led by the attending physician.  Recommendations may be updated based on patient status, additional functional criteria and insurance authorization.  Follow Up Recommendations  Can patient physically be transported by private vehicle: No    Assistance Recommended at Discharge Frequent or constant Supervision/Assistance  Patient can return home with the following Two people to help  with walking and/or transfers;Two people to help with bathing/dressing/bathroom   Equipment Recommendations  Other (comment) (TBA)    Recommendations for Other Services       Precautions / Restrictions Precautions Precautions: Fall;Other (comment) Precaution Comments: hypersensitivity/pain B feet; watch BP Required Braces or Orthoses:  (pt has bil heel offloading darco shoes in room) Restrictions Weight Bearing Restrictions: No Other Position/Activity Restrictions: pt reports bil foot discomfort but with darco shoes able to tolerate some pressure of forefoot on floor     Mobility  Bed Mobility Overal bed mobility: Needs Assistance Bed Mobility: Supine to Sit     Supine to sit: Min assist, HOB elevated     General bed mobility comments: min A to manage LLE and elaevate trunk, cues to square hips at EOB and scoot out to edge    Transfers Overall transfer level: Needs assistance Equipment used: Rolling walker (2 wheels) Transfers: Sit to/from Stand, Bed to chair/wheelchair/BSC Sit to Stand: Max assist, Mod assist          Lateral/Scoot Transfers: Min assist General transfer comment: pt able to come to stand with mod-max A x3 trials, able to come to full upright x2 trials with cues to extend UE for increased support, able to maintain stanidng for ~ 45 seconds each trial, min A to laterally scoot along EOB to HOB at end of session    Ambulation/Gait             Pre-gait activities: standing marching x3 ea side with cues for weight shift and mod A to maintain balance General Gait Details: unable   Stairs             Wheelchair Mobility    Modified Rankin (Stroke  Patients Only)       Balance Overall balance assessment: Needs assistance Sitting-balance support: Feet supported, Bilateral upper extremity supported Sitting balance-Leahy Scale: Fair Sitting balance - Comments: pt sitting up EOB at end of session   Standing balance support: Bilateral  upper extremity supported Standing balance-Leahy Scale: Poor Standing balance comment: able to maintain static standing with min A and RW support                            Cognition Arousal/Alertness: Awake/alert Behavior During Therapy: WFL for tasks assessed/performed Overall Cognitive Status: Within Functional Limits for tasks assessed                                 General Comments: pt pleasant and particapatory        Exercises General Exercises - Lower Extremity Long Arc Quad: Strengthening, Right, Left, 20 reps, Seated Hip Flexion/Marching: Strengthening, Right, Left, 20 reps, Seated    General Comments General comments (skin integrity, edema, etc.): VSS on RA, pt sister present and supportive throughout session      Pertinent Vitals/Pain Pain Assessment Pain Assessment: 0-10 Pain Score: 8  Pain Location: bottoms of his feet Pain Descriptors / Indicators: Tender, Sore Pain Intervention(s): Limited activity within patient's tolerance, Monitored during session, Repositioned    Home Living                          Prior Function            PT Goals (current goals can now be found in the care plan section) Acute Rehab PT Goals Patient Stated Goal: To get stronger anf go home PT Goal Formulation: With patient Time For Goal Achievement: 12/30/22 Progress towards PT goals: Progressing toward goals    Frequency    Min 2X/week      PT Plan Current plan remains appropriate    Co-evaluation              AM-PAC PT "6 Clicks" Mobility   Outcome Measure  Help needed turning from your back to your side while in a flat bed without using bedrails?: A Lot Help needed moving from lying on your back to sitting on the side of a flat bed without using bedrails?: A Lot Help needed moving to and from a bed to a chair (including a wheelchair)?: Total Help needed standing up from a chair using your arms (e.g., wheelchair or  bedside chair)?: A Lot Help needed to walk in hospital room?: Total Help needed climbing 3-5 steps with a railing? : Total 6 Click Score: 9    End of Session Equipment Utilized During Treatment: Other (comment) (darco shoes bil to offload his heels) Activity Tolerance: Patient tolerated treatment well Patient left: in bed;with call bell/phone within reach;with family/visitor present (seated EOB) Nurse Communication: Mobility status PT Visit Diagnosis: Other abnormalities of gait and mobility (R26.89);Muscle weakness (generalized) (M62.81) Pain - Right/Left:  (bilateral) Pain - part of body: Ankle and joints of foot     Time: 1430-1455 PT Time Calculation (min) (ACUTE ONLY): 25 min  Charges:  $Therapeutic Exercise: 8-22 mins $Therapeutic Activity: 8-22 mins                     Bodee Lafoe R. PTA Acute Rehabilitation Services Office: [phone]   Catalina Antigua 12/22/2022, 3:10 PM

## 2022-12-22 NOTE — Progress Notes (Signed)
Per CSW, pt now considering snf at d/c. Contacted [facility] to inquire about pt's HD times. Pt arrives at 6:10 am for 6:30 am chair time on MWF. This info was provided to CSW for d/c planning purposes. Will assist as needed.   Olivia Canter Renal Navigator [phone]

## 2022-12-22 NOTE — Progress Notes (Addendum)
eLink Physician-Brief Progress Note Patient Name: Justin [name] DOB: 17-Jan-1946 MRN: [id number]   Date of Service  12/22/2022  HPI/Events of Note  Patient transferred back to the ICU secondary to hypotension, stat hemoglobin unchanged from baseline of 9.0 gm / dl, Troponin 18, Coags unremarkable, EKG pending, patient is asymptomatic currently (insists he feels fine).  eICU Interventions  Will  follow up EKG and interval hemoglobin and Troponin, lactic acid level ordered, patient instructed to notify bedside RN stat if recurrence of symptoms. EKG without acute changes.        Migdalia Dk 12/22/2022, 7:36 PM

## 2022-12-22 NOTE — Progress Notes (Signed)
Called for urgent consult for hypotension that began suddenly in the last hour. He was more somnolent until he was placed into Trendelenburg position, when he woke up more. He is getting 1L bolus. He denies complaints other than not wanting to got to the ICU. He  does not remember before being placed in trendelemburg. He has been off AC due to recent GIB.   BP (!) 66/54 (BP Location: Right Arm)   Pulse (!) 110   Temp 98.3 F (36.8 C) (Oral)   Resp 19   Ht 5\' 10"  (1.778 m)   Wt 87.1 kg   SpO2 100%   BMI 27.55 kg/m  Awake, alert, in trendelenburg position Speaking in full sentences, moving all extremities S1S2, RRR Breathing comfortably on RA Abd soft, NT No peripheral edema, palpable thrill in LUE  Plan: STAT CBC, LA, BMP, Trop, EKG, type and screen Norepi ordered Transfer to ICU. RR RN at bedside to assist with transfer.    Steffanie Dunn, DO 12/22/22 5:52 PM [facility]  For contact information, see Amion. If no response to pager, please call PCCM consult pager. After hours, 7PM- 7AM, please call Elink.

## 2022-12-22 NOTE — Progress Notes (Signed)
Pt BP is low 90/45 MD notified order bolus. Bolus given pressure declines to 62/42 manual  Pt becoming lethargic but still responsive to touch. MD Netty notified advised to keep running fluids, pt placed in trend Critical care being called by Dr Mal Misty. Monitoring pt at bedside.

## 2022-12-22 NOTE — TOC Progression Note (Signed)
Transition of Care [facility]) - Initial/Assessment Note    Patient Details  Name: Justin [name] MRN: [id number] Date of Birth: 1945/09/15  [facility] of [facility]) CM/SW Contact:    Ralene Bathe, LCSW Phone Number: 12/22/2022, 3:18 PM  Clinical Narrative:                 Per PT, patient is now open to SNF.  LCSW faxed out referral.  Expected Discharge Plan: Home w Home Health Services Barriers to Discharge: Continued Medical Work up   Patient Goals and CMS Choice Patient states their goals for this hospitalization and ongoing recovery are:: To return home          Expected Discharge Plan and Services In-house Referral: Clinical Social Work     Living arrangements for the past 2 months: Skilled Nursing Facility, Single Family Home                                      Prior Living Arrangements/Services Living arrangements for the past 2 months: Skilled Nursing Facility, Single Family Home Lives with:: Relatives Patient language and need for interpreter reviewed:: Yes Do you feel safe going back to the place where you live?: Yes      Need for Family Participation in Patient Care: Yes (Comment) Care giver support system in place?: Yes (comment)   Criminal Activity/Legal Involvement Pertinent to Current Situation/Hospitalization: No - Comment as needed  Activities of Daily Living      Permission Sought/Granted   Permission granted to share information with : Yes, Verbal Permission Granted     Permission granted to share info w AGENCY: home health agencies, primary care providers        Emotional Assessment Appearance:: Appears stated age Attitude/Demeanor/Rapport: Engaged Affect (typically observed): Adaptable Orientation: : Oriented to Self, Oriented to Place, Oriented to  Time, Oriented to Situation Alcohol / Substance Use: Not Applicable Psych Involvement: No (comment)  Admission diagnosis:  End stage renal disease (HCC) [N18.6] Hemorrhagic shock  (HCC) [R57.8] Gastrointestinal hemorrhage, unspecified gastrointestinal hemorrhage type [K92.2] Patient Active Problem List   Diagnosis Date Noted   Rectal ulcer 12/17/2022   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   Hemorrhagic shock (HCC) 12/14/2022   Acute encephalopathy 11/09/2022   Syncope 11/09/2022   Acute blood loss anemia 11/09/2022   Melena 11/09/2022   History of colonic polyps 11/09/2022   End stage renal disease (HCC) 11/09/2022   Gastric nodule 11/09/2022   Rectal bleeding 09/17/2022   Colon ulcer 09/17/2022   Lower GI bleeding 09/17/2022   GI bleed 09/16/2022   Prolonged QT interval 09/16/2022   History of prostate cancer 09/16/2022   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   History of colon polyps 09/16/2022   Lower GI bleed 09/12/2022   Acute GI bleeding 09/10/2022   Wide-complex tachycardia 06/17/2022   Hypotension 06/13/2022   Obesity (BMI 30-39.9) 06/13/2022   Perirectal abscess 06/13/2022   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   Murmur, cardiac 01/27/2022   Chronic back pain 01/27/2022   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/27/2022   GERD (gastroesophageal reflux disease) 11/11/2021   High risk medication use 11/11/2021   History of cardioversion 11/11/2021   Paroxysmal atrial fibrillation with RVR (HCC) 11/11/2021   PVC (premature ventricular contraction) 11/11/2021   Nonrheumatic aortic valve stenosis 11/06/2021   Mixed hyperlipidemia 11/06/2021   Abdominal mass 05/05/2021   First degree AV  block 07/13/2020   Paroxysmal A-fib (HCC) 01/29/2020   Wound infection 01/28/2020   H/O vitrectomy 12/07/2019   Macular pucker, 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 determined by examination associated with type 2 diabetes mellitus (HCC) 10/24/2019   Stable treated proliferative diabetic retinopathy of left eye determined by examination associated with type 2 diabetes mellitus (HCC) 10/24/2019   ESRD  on hemodialysis (HCC) 07/29/2017   Anticoagulated 07/13/2017   Malignant neoplasm of prostate (HCC) 07/12/2017   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   Coagulation defect, unspecified (HCC) 01/08/2017   Idiopathic gout 01/08/2017   Acute on chronic blood loss anemia 11/20/2015   Blurred vision, left eye 07/10/2011   Essential hypertension 07/10/2011   DM (diabetes mellitus) (HCC) 07/10/2011   PCP:  [name], DO Pharmacy:   [facility] - [place], [place] - [address] [place] [address] Phone: [phone] Fax: [phone]     Social Determinants of Health (SDOH) Social History: SDOH Screenings   Food Insecurity: No Food Insecurity (11/24/2022)  Housing: Low Risk  (11/24/2022)  Transportation Needs: No Transportation Needs (11/24/2022)  Utilities: Not At Risk (11/24/2022)  Tobacco Use: Medium Risk (12/20/2022)   SDOH Interventions:     Readmission Risk Interventions    11/14/2022   10:08 AM 09/18/2022    4:10 PM 06/17/2022    2:47 PM  Readmission Risk Prevention Plan  Transportation Screening Complete Complete Complete  HRI or Home Care Consult   Complete  Social Work Consult for Recovery Care Planning/Counseling   Complete  Palliative Care Screening   Not Applicable  Medication Review Oceanographer) Complete Complete Complete  PCP or Specialist appointment within 3-5 days of discharge Complete Complete   HRI or Home Care Consult Complete Complete   SW Recovery Care/Counseling Consult Complete    Palliative Care Screening Not Applicable Complete   Skilled Nursing Facility Complete Not Applicable

## 2022-12-22 NOTE — Significant Event (Addendum)
Rapid Response Event Note   Reason for Call :  Hypotension with SBP in the 70s  Initial Focused Assessment:  On arrival pt laying in bed A&O x4. No c/o pain, denies feeling lightheaded or dizzy. NAD noted, no signs of active bleeding. Pt states last BM was a few days ago.   Manual BP 90/45.   Interventions:  Md notified PTA- to bedside Bolus   Plan of Care:  Continue to monitor pt BP. Recheck after bolus.   RN instructed to call with any changes or concerns.    Event Summary:   MD Notified: Dr Caleb Popp Call Time: 1629 Arrival Time: 1633 End Time: 1645   Called back at 1725 stating pt BP continues to drop and now more lethargic. CCM at bedside. Pt transferred to 3M10 to start Levo gtt.   Mordecai Rasmussen, RN

## 2022-12-22 NOTE — Care Management Important Message (Signed)
Important Message  Patient Details  Name: BUDDIE MARSTON MRN: [id number] Date of Birth: 24-Oct-1945   Medicare Important Message Given:  Yes     Malessa Zartman Stefan Church 12/22/2022, 3:07 PM

## 2022-12-22 NOTE — Progress Notes (Signed)
eLink Physician-Brief Progress Note Patient Name: Justin [name] DOB: 1946/03/23 MRN: [id number]   Date of Service  12/22/2022  HPI/Events of Note  Interval Troponin, hemoglobin and lactic acid within normal limits.  eICU Interventions  No intervention.        Migdalia Dk 12/22/2022, 11:43 PM

## 2022-12-22 NOTE — Progress Notes (Signed)
PROGRESS NOTE    Justin [name]  JXB:[id number] DOB: [birth year] DOA: 12/13/2022 PCP: [name], DO   Brief Narrative: Justin [name] is a 77 y.o. male with a history of atrial fibrillation on Eliquis, rectal AVM with recurrent GI bleeding, ESRD on hemodialysis, anemia, chronic hypotension, moderate aortic stenosis.  Patient presented secondary to rectal bleeding with associated worsening hypotension.  Patient required initial transfusion of 3 units of PRBC in addition to [facility] and was started on vasopressor support to manage hypotension from hemorrhagic shock.  Patient admitted to ICU on admission.  While in ICU, patient was evaluated by gastroenterology. 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  Vasopressors weaned off and patient transferred to the hospital service on 6/10.   Assessment and Plan:  Acute lower GI bleed Present on admission.  Complicated by Eliquis use.  [facility] was consulted and performed upper endoscopy and colonoscopy. 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 GI bleeding appears to be resolved.  Hemorrhagic shock Secondary to GI bleed and complicated by Eliquis use and underlying baseline hypotension. Patient required ICU admission 6/3 for vasopressor support. Patient managed on Levophed which was successfully weaned off on 6/7 with management of GI bleeding. Resolved.  Acute on chronic anemia Baseline creatinine appears to be around 8. Acute anemia secondary to acute hemorrhage. Patient has received 3 units of PRBC while admitted. Hemoglobin stable.  Hypotension Chronic. Patient is managed on midodrine as an  outpatient. Complicated by acute GI bleeding and need for vasopressor support this admission. Home midodrine dose increased. -Continue midodrine  Atrial flutter/atrial fibrillation, unspecified Chronic issue. Patient is not on antiarrhythmia or rate control medication. He is managed on Eliquis as an outpatient which is now discontinued secondary to high risk of bleed with significant GI bleeding. 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  Aortic stenosis Moderate stenosis noted on most recent Transthoracic Echocardiogram from 5/14.   ESRD on HD Nephrology consulted. Patient is receiving hemodialysis while inpatient.  History of acute cholecystitis Recent. S/p percutaneous cholecystostomy tube placed in April of 2024 and still present.  Diabetes mellitus type 2 Controlled. Hemoglobin A1C of 5.1%. patient is not on medication management.  Diabetic neuropathy -Continue Lyrica 75 mg daily  GERD -Continue Protonix  Pressure injury Left posterior heel, right heel. Present on admission.    DVT prophylaxis: SCDs Code Status:   Code Status: DNR Family Communication: None at bedside Disposition Plan: Medically stable for discharge. Discharge likely to SNF pending bed availability.   Consultants:  PCCM [facility]  Procedures:  6/6: Colonoscopy; Upper GI endoscopy  Antimicrobials: None    Subjective: No concern this morning. Doing well.  Objective: BP (!) 111/56 (BP Location: Right Arm)   Pulse (!) 110   Temp 98.3 F (36.8 C) (Oral)   Resp 18   Ht 5\' 10"  (1.778 m)   Wt 87.1 kg   SpO2 100%   BMI 27.55 kg/m   Examination:  General exam: Appears calm and comfortable Respiratory system: Clear to auscultation. Respiratory effort normal. Cardiovascular system: S1 & S2 heard Gastrointestinal  system: Abdomen is soft and nontender. No organomegaly or masses felt. Normal bowel sounds heard. Central nervous system: Alert and oriented. Skin: No cyanosis. No rashes Psychiatry: Judgement and insight appear normal. Mood & affect appropriate.    Data Reviewed: I have personally reviewed following labs and imaging studies  CBC Lab Results  Component Value Date   WBC 7.6 12/22/2022   RBC 2.99 (L) 12/22/2022   HGB 9.0 (L) 12/22/2022   HCT 28.3 (L) 12/22/2022   MCV 94.6 12/22/2022   MCH 30.1 12/22/2022   PLT 174 12/22/2022   MCHC 31.8 12/22/2022   RDW 16.8 (H) 12/22/2022   LYMPHSABS 0.9 11/23/2022   MONOABS 1.1 (H) 11/23/2022   EOSABS 0.1 11/23/2022   BASOSABS 0.1 11/23/2022     Last metabolic panel Lab Results  Component Value Date   NA 136 12/21/2022   K 4.1 12/21/2022   CL 101 12/21/2022   CO2 26 12/21/2022   BUN 14 12/21/2022   CREATININE 6.55 (H) 12/21/2022   GLUCOSE 92 12/21/2022   GFRNONAA 8 (L) 12/21/2022   GFRAA 8 (L) 02/01/2020   CALCIUM 8.7 (L) 12/21/2022   PHOS 2.8 12/21/2022   PROT 5.3 (L) 12/20/2022   ALBUMIN 2.9 (L) 12/21/2022   LABGLOB 3.7 10/01/2015   AGRATIO 0.9 10/01/2015   BILITOT 0.7 12/20/2022   ALKPHOS 38 12/20/2022   AST 16 12/20/2022   ALT 10 12/20/2022   ANIONGAP 9 12/21/2022    GFR: Estimated Creatinine Clearance: 9.6 mL/min (A) (by C-G formula based on SCr of 6.55 mg/dL (H)).  Recent Results (from the past 240 hour(s))  MRSA Next Gen by PCR, Nasal     Status: None   Collection Time: 12/14/22  7:00 AM   Specimen: Nasal Mucosa; Nasal Swab  Result Value Ref Range Status   MRSA by PCR Next Gen NOT DETECTED NOT DETECTED Final    Comment: (NOTE) The GeneXpert MRSA Assay (FDA approved for NASAL specimens only), is one component of a comprehensive MRSA colonization surveillance program. It is not intended to diagnose MRSA infection nor to guide or monitor treatment for MRSA infections. Test performance is not FDA approved in  patients less than 26 years old. Performed at [facility], [address], [place], [place] [address]       Radiology Studies: No results found.    LOS: 8 days    Jacquelin Hawking, MD [facility] 12/22/2022, 1:42 PM   If 7PM-7AM, please contact night-coverage [email or web address]

## 2022-12-22 NOTE — Progress Notes (Signed)
[facility] Progress Note  Subjective: pt seen in room, no c/o's.   Vitals:   12/21/22 1813 12/21/22 2157 12/22/22 0450 12/22/22 0855  BP: 105/72 108/75 100/67 (!) 111/56  Pulse: (!) 108 (!) 112 (!) 106 (!) 110  Resp: 18 17 16 18   Temp: 98.3 F (36.8 C) 98.3 F (36.8 C) 98.2 F (36.8 C) 98.3 F (36.8 C)  TempSrc:  Oral Oral Oral  SpO2: 100% 100%  100%  Weight:      Height:        Exam: Gen: NAD CVS: Tachy, III/VI SEM LUSB Resp: CTA Abd:+BS,soft, NT/ND Ext: no edema, LUE AVF +T/B   Home meds - lipitor, nephrovite, , sensipar 30mg  po qd, eliquis, midodrine 15mg  tid, protonix, lyrica, trazodone, prns/ vits/ supps   OP HD: [place] M[facility] 3h  450/600  91.6kg  2/2.5 bath  LUA AVF  Hep none - coming in under dry wt around 89kg last 2 sessions - rocaltrol 0.75 mcg po tiw - venofer 50 iv weekly - mircera 200 mcg IV q 2 wks, last 5/27, due 6/10 - last Hb 9.6 on 5/29   Assessment/Plan:  Hemorrhagic shock - off pressors x 36 hrs. Cont po midodrine 20 tid.  S/P 3 units PRBC's.  CT angio negative for active bleed.  Eliquis on hold for now.  Had recurrent rectal bleeding/ hematochezia on 12/15/22, seen by GI and had EGD/ colonoscopy on 6/07.  EGD negative, colonoscopy with rectal ulcer s/p clip.  Hgb was in the 7's, he got 1 more unit prbc's yesterday 6/09, Hb up to 8.7 today. Stable. Transfuse prn.    ESRD - usual HD MWF. HD tomorrow.   Hypertension/volume  -  was 7kg under dry wt so we gave IVF"s the last 2 days to help get volume and BP up and BP's are better. Will cont to avoid UF for now.   Anemia  - hb 7-8 range here. +ABLA on top of anemia of ESRD.  S/p blood transfusions x 3.  Last OP esa was given 5/27. Getting darbe 200 mcg sq here on Mondays.   Metabolic bone disease -  CCa and phos are in range. Continues his home meds  here w/ rocaltrol and sensipar.   Nutrition -  renal diet, carb modified.  A fib/flutter - eliquis on hold at this time as risks outweigh  benefits.    Acute cholecystitis s/p percutaneous cholecystostomy tube - from OSH 4/2- 4/23 and felt to be too high risk for surgical intervention.  Drain exchanged 12/01/22 and f/u with surgery at [facility] in July.  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  Currently DNR.        Vinson Moselle MD [facility] 12/22/2022, 11:38 AM  Recent Labs  Lab 12/18/22 0322 12/18/22 0323 12/20/22 0312 12/21/22 0536 12/22/22 0357  HGB  --    < > 7.0* 8.7* 9.0*  ALBUMIN  --    < > 2.7* 2.9*  --   CALCIUM  --    < > 8.6* 8.7*  --   PHOS 2.7  --   --  2.8  --   CREATININE  --    < > 5.03* 6.55*  --   K  --    < > 3.7 4.1  --    < > = values in this interval not displayed.    No results for input(s): "IRON", "  TIBC", "FERRITIN" in the last 168 hours. Inpatient medications:  sodium chloride   Intravenous Once   calcitRIOL  0.75 mcg Oral Q M,W,F   Chlorhexidine Gluconate Cloth  6 each Topical Daily   cinacalcet  30 mg Oral Q breakfast   darbepoetin (ARANESP) injection - DIALYSIS  200 mcg Subcutaneous Q Mon-1800   docusate sodium  100 mg Oral BID   midodrine  20 mg Oral Q8H   pantoprazole  40 mg Oral BID   pregabalin  75 mg Oral Daily     acetaminophen, docusate sodium, mouth rinse, polyethylene glycol

## 2022-12-23 DIAGNOSIS — I350 Nonrheumatic aortic (valve) stenosis: Secondary | ICD-10-CM | POA: Diagnosis not present

## 2022-12-23 DIAGNOSIS — I4811 Longstanding persistent atrial fibrillation: Secondary | ICD-10-CM

## 2022-12-23 DIAGNOSIS — K31811 Angiodysplasia of stomach and duodenum with bleeding: Secondary | ICD-10-CM

## 2022-12-23 DIAGNOSIS — R578 Other shock: Secondary | ICD-10-CM | POA: Diagnosis not present

## 2022-12-23 DIAGNOSIS — I950 Idiopathic hypotension: Secondary | ICD-10-CM | POA: Diagnosis not present

## 2022-12-23 LAB — GLUCOSE, CAPILLARY
Glucose-Capillary: 81 mg/dL (ref 70–99)
Glucose-Capillary: 65 mg/dL — ABNORMAL LOW (ref 70–99)
Glucose-Capillary: 77 mg/dL (ref 70–99)
Glucose-Capillary: 91 mg/dL (ref 70–99)

## 2022-12-23 LAB — CBC
HCT: 27.9 % — ABNORMAL LOW (ref 39.0–52.0)
Hemoglobin: 8.8 g/dL — ABNORMAL LOW (ref 13.0–17.0)
MCH: 29.7 pg (ref 26.0–34.0)
MCHC: 31.5 g/dL (ref 30.0–36.0)
MCV: 94.3 fL (ref 80.0–100.0)
Platelets: 179 10*3/uL (ref 150–400)
RBC: 2.96 MIL/uL — ABNORMAL LOW (ref 4.22–5.81)
RDW: 16.4 % — ABNORMAL HIGH (ref 11.5–15.5)
WBC: 7.8 10*3/uL (ref 4.0–10.5)
nRBC: 0 % (ref 0.0–0.2)

## 2022-12-23 LAB — BASIC METABOLIC PANEL
Anion gap: 11 (ref 5–15)
BUN: 11 mg/dL (ref 8–23)
CO2: 26 mmol/L (ref 22–32)
Calcium: 8.1 mg/dL — ABNORMAL LOW (ref 8.9–10.3)
Chloride: 99 mmol/L (ref 98–111)
Creatinine, Ser: 5.75 mg/dL — ABNORMAL HIGH (ref 0.61–1.24)
GFR, Estimated: 9 mL/min — ABNORMAL LOW (ref >60)
Glucose, Bld: 82 mg/dL (ref 70–99)
Potassium: 3.9 mmol/L (ref 3.5–5.1)
Sodium: 136 mmol/L (ref 135–145)

## 2022-12-23 LAB — HEMOGLOBIN AND HEMATOCRIT, BLOOD
HCT: 27.9 % — ABNORMAL LOW (ref 39.0–52.0)
Hemoglobin: 8.9 g/dL — ABNORMAL LOW (ref 13.0–17.0)

## 2022-12-23 MED ORDER — PANTOPRAZOLE SODIUM 40 MG PO TBEC
40.0000 mg | DELAYED_RELEASE_TABLET | Freq: Two times a day (BID) | ORAL | Status: DC
  Administered 2022-12-23 – 2023-01-02 (×20): 40 mg via ORAL
  Filled 2022-12-23 (×20): qty 1

## 2022-12-23 NOTE — Procedures (Signed)
HD Note:  Some information was entered later than the data was gathered due to patient care needs. The stated time with the data is accurate.  Patient treated at bedside. Alert and oriented.  Stated he did not sleep last night and is now sleepy.  He stated, and his niece confirms, that his normal pattern is to stay up at night and sleep during the day. Informed consent signed and in chart.   Patient's BP low when treatment set up was being done. Patient given midodrine approximately 30 min prior to the start of treatment.  TX duration: 3.5 hours  Patient tolerated well.  Alert, without acute distress.  Hand-off given to patient's nurse.   Access used: Left upper arm fistula Access issues: None  Orders were to keep patient even during treatment.  There was no loss or gain of fluid during treatment.    [facility]

## 2022-12-23 NOTE — Consult Note (Addendum)
Cardiology Consultation   Patient ID: [name] MRN: [id number]; DOB: 21-Jun-1946  Admit date: 12/13/2022 Date of Consult: 12/23/2022  PCP:  Justin Harrison, DO   [facility] Providers Cardiologist: last saw Dr Dominga Ferry 06/2022 during inpatient consult   Patient Profile:   [name] is a 77 y.o. 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  History of Present Illness:   Mr. [name] was above complex PMH presented to the ER 12/13/2022 from [facility] for recurrent rectal bleeding.  He had a continuous rectal bleeding throughout ER stay and became progressively hypotensive.   He was ultimately admitted to ICU for hemorrhagic shock, required IV PPI, 3 unit PRBC, Kcentra, and vasopressor support.  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  Colonoscopy  12/17/2022 showed 20 mm polyp in the cecum, sessile not removed single 4 mm ulcer in the rectum no bleeding but had stigmata of recent bleeding, 3 clips placed single localized erosion in the rectum, 1 clip placed.  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  GI felt Eliquis should not be resumed given 2 recent major GI bleed requiring hospitalization since April 2024.  EUS biopsy of the lesion was felt not appropriate as  patient is a poor surgical candidate.  His hemoglobin has been stable around 8. Nephrology has been consulted and following for hemodialysis.  He remained DNR given significant comorbidity and failure to thrive.  He was weaned off vasopressor support and transferred to hospital service 12/21/2022.  He was transferred back to ICU on 12/22/2022 due to recurrent hypotension, hemoglobin had remained stable and he was not having any recurrent GI bleed. EKG from 12/22/2022 A-fib RVR 110bpm.  Cardiology is consulted today for further evaluation.  Upon encounter, patient is lethargic on exam, able to open eyes to loud voice and sternum rubs. He said I am ok and responded to his name. He states he does not know how long he had A fib. He denied any pain at this time. His niece at the bedside, states patient used to live in [name], now moved to [place] and lives with her. He needs a new cardiologist and used to see [name]. She has limited knowledge on his A fib/flutter. She states patient has been sick a for few months, does not do much at baseline, sleeps a lot. Dialysis nurse at bedside, reports patient tend to get hypotensive with dialysis, is normally more alert.   Previously, he was recently hospitalized here from 11/08/2022 to 11/24/2022 for syncope, GI bleed, acute blood loss anemia, hypoxia with pneumonia. He had a EGD in April 2024 revealing normal esophagus, hiatal hernia, single submucosal papule in the stomach and 2 duodenal polyps.  Capsule study was completed 11/11/2022 revealing possible tiny gastric AVM versus mild gastritis versus healing from previous gastric biopsy site, no bleeding.  He  was given 1 unit PRBC transfusion, antibiotic, PPI.  At the time of discharge, he was recommended to stop baby aspirin and resumed on Eliquis for A-fib/flutter.    Prior to above admission, he was admitted in [facility] in April 2024 for acute cholecystitis and non-STEMI, required ICU and  vasopressor support, surgery was felt high risk due to underlying aortic stenosis, he was given antibiotic and cholecystectomy tube placement.   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  He was seen by Dr. Wyline [name] 06/2022 during hospitalization for septic shock secondary to perirectal abscess.  Had episodes of NSVT and wide-complex tachycardia concerning for ventricular tachycardia, required brief CPR and resolved with amiodarone drip.  Echo from 06/13/2022 showed LVEF 60 to 65%, indeterminate diastolic parameter, normal RV, moderately elevated PASP with RVSP 51.6 mm per mercury, moderate tricuspid regurgitation.  Recent echo was completed 11/24/2022 which revealed moderate calcification of aortic valve, trivial AI, moderate aortic stenosis with mean gradient 20.2 mmHg, Vmax 2.1m/s, AVA 0.791cm2.  LVEF 50 to 55%.  No regional wall motion abnormality, mild concentric LVH, indeterminate diastolic parameter, mildly reduced RV, severe RAE and LAE. Moderate to severe tricuspid regurgitation.     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) 12/07/2019   Dialysis patient (HCC)    Gout    Hypercalcemia 11/14/2021   Hyperkalemia 10/19/2018   Hypertension    Hypotension 06/13/2022   Right posterior capsular opacification 02/27/2021   Septic shock (HCC)    Vitreous hemorrhage of right eye (HCC) 10/24/2019    Past Surgical History:   Procedure Laterality Date   A/V FISTULAGRAM Left 08/17/2017   Procedure: A/V FISTULAGRAM;  Surgeon: Renford Dills, MD;  Location: [facility] INVASIVE CV LAB;  Service: Cardiovascular;  Laterality: Left;   A/V SHUNT INTERVENTION N/A 08/17/2017   Procedure: A/V SHUNT INTERVENTION;  Surgeon: Renford Dills, MD;  Location: [facility] INVASIVE CV LAB;  Service: Cardiovascular;  Laterality: N/A;   AV FISTULA PLACEMENT Left 05/31/2015   Procedure: ARTERIOVENOUS (AV) FISTULA CREATION;  Surgeon: Renford Dills, MD;  Location: [facility] ORS;  Service: Vascular;  Laterality: Left;   COLONOSCOPY N/A 12/17/2022   Procedure: COLONOSCOPY;  Surgeon: Napoleon Form, MD;  Location: [facility] ENDOSCOPY;  Service: Gastroenterology;  Laterality: N/A;   COLONOSCOPY WITH PROPOFOL N/A 09/11/2022   Procedure: COLONOSCOPY WITH PROPOFOL;  Surgeon: Imogene Burn, MD;  Location: [facility] ENDOSCOPY;  Service: Gastroenterology;  Laterality: N/A;   COLONOSCOPY WITH PROPOFOL N/A 09/17/2022   Procedure: COLONOSCOPY WITH PROPOFOL;  Surgeon: Benancio Deeds, MD;  Location: [facility] ENDOSCOPY;  Service: Gastroenterology;  Laterality: N/A;   ESOPHAGOGASTRODUODENOSCOPY (EGD) WITH PROPOFOL N/A 09/11/2022   Procedure: ESOPHAGOGASTRODUODENOSCOPY (EGD) WITH PROPOFOL;  Surgeon: Imogene Burn, MD;  Location: [facility] ENDOSCOPY;  Service: Gastroenterology;  Laterality: N/A;   ESOPHAGOGASTRODUODENOSCOPY (EGD) WITH PROPOFOL N/A 12/17/2022   Procedure: ESOPHAGOGASTRODUODENOSCOPY (EGD) WITH PROPOFOL;  Surgeon: Napoleon Form, MD;  Location: [facility] ENDOSCOPY;  Service: Gastroenterology;  Laterality: N/A;   excision bone spurs Bilateral 1989   feet   GIVENS CAPSULE STUDY N/A 11/10/2022   Procedure: GIVENS CAPSULE STUDY;  Surgeon: Shellia Cleverly, DO;  Location: [facility] ENDOSCOPY;  Service: Gastroenterology;  Laterality: N/A;   HEMOSTASIS CLIP PLACEMENT  09/11/2022   Procedure: HEMOSTASIS CLIP PLACEMENT;  Surgeon: Imogene Burn, MD;  Location: [facility] ENDOSCOPY;  Service:  Gastroenterology;;   HEMOSTASIS CLIP PLACEMENT  09/17/2022   Procedure: HEMOSTASIS CLIP PLACEMENT;  Surgeon: Benancio Deeds, MD;  Location: [facility] ENDOSCOPY;  Service: Gastroenterology;;   HEMOSTASIS CLIP PLACEMENT  12/17/2022   Procedure: HEMOSTASIS CLIP PLACEMENT;  Surgeon: Napoleon Form, MD;  Location: [facility] ENDOSCOPY;  Service: Gastroenterology;;   HOT HEMOSTASIS N/A 09/11/2022   Procedure: HOT HEMOSTASIS (ARGON PLASMA COAGULATION/BICAP);  Surgeon: Imogene Burn, MD;  Location: [facility] ENDOSCOPY;  Service: Gastroenterology;  Laterality: N/A;   HOT HEMOSTASIS N/A 09/17/2022   Procedure: HOT HEMOSTASIS (ARGON PLASMA COAGULATION/BICAP);  Surgeon: Benancio Deeds, MD;  Location: [facility] ENDOSCOPY;  Service: Gastroenterology;  Laterality: N/A;   INCISION AND DRAINAGE PERIRECTAL ABSCESS N/A 06/15/2022   Procedure: IRRIGATION AND DEBRIDEMENT PERIRECTAL ABSCESS;  Surgeon: Franky Macho, MD;  Location: [facility] ORS;  Service: General;  Laterality: N/A;   KNEE SURGERY Left 1998   arthroscopy   POLYPECTOMY  09/11/2022   Procedure: POLYPECTOMY;  Surgeon: Imogene Burn, MD;  Location: [facility] ENDOSCOPY;  Service: Gastroenterology;;   SHOULDER SURGERY Left 1994   rotator cuff     Home Medications:  Prior to Admission medications   Medication Sig Start Date End Date Taking? Authorizing Provider  acetaminophen (TYLENOL) 325 MG tablet Take 2 tablets (650 mg total) by mouth every 6 (six) hours as needed for mild pain (or Fever >/= 101). 11/24/22  Yes Pokhrel, Laxman, MD  atorvastatin (LIPITOR) 40 MG tablet Take 40 mg by mouth at bedtime. 10/30/14  Yes [provider]  b complex-vitamin c-folic acid (NEPHRO-VITE) 0.8 MG TABS tablet Take 1 tablet by mouth at bedtime.   Yes [provider]  calcitRIOL (ROCALTROL) 0.25 MCG capsule Take 3 capsules (0.75 mcg total) by mouth every Monday, Wednesday, and Friday with hemodialysis. Patient taking differently: Take 0.25 mcg by mouth daily. 11/25/22  Yes Pokhrel, Laxman,  MD  cinacalcet (SENSIPAR) 30 MG tablet Take 30 mg by mouth daily. 08/25/22  Yes [provider]  ELIQUIS 5 MG TABS tablet Take 1 tablet (5 mg total) by mouth 2 (two) times daily. Resume Your Eliquis after 5 days 09/23/22  Yes Kc, Dayna Barker, MD  midodrine (PROAMATINE) 5 MG tablet Take 3 tablets (15 mg total) by mouth in the morning, at noon, and at bedtime. Prior to dialysis M-W-F 11/24/22  Yes Pokhrel, Laxman, MD  Olopatadine HCl (PATADAY) 0.2 % SOLN Place 1 drop into both eyes daily.   Yes [provider]  pantoprazole (PROTONIX) 40 MG tablet Take 1 tablet (40 mg total) by mouth 2 (two) times daily before a meal. 11/24/22  Yes Pokhrel, Laxman, MD  pregabalin (LYRICA) 75 MG capsule Take 1 capsule (75 mg total) by mouth at bedtime. 11/24/22 11/24/23 Yes Pokhrel, Laxman, MD  sevelamer carbonate (RENVELA) 800 MG tablet Take 800 mg by mouth 2 (two) times daily.   Yes [provider]  traZODone (DESYREL) 50 MG tablet Take 1 tablet (50 mg total) by mouth at bedtime as needed for sleep. 11/24/22  Yes Pokhrel, Rebekah Chesterfield, MD    Inpatient Medications: Scheduled Meds:  sodium chloride   Intravenous  Once   calcitRIOL  0.75 mcg Oral Q M,W,F   Chlorhexidine Gluconate Cloth  6 each Topical Daily   Chlorhexidine Gluconate Cloth  6 each Topical Q0600   cinacalcet  30 mg Oral Q breakfast   darbepoetin (ARANESP) injection - DIALYSIS  200 mcg Subcutaneous Q Mon-1800   midodrine  20 mg Oral Q8H   pantoprazole  40 mg Oral BID   pregabalin  75 mg Oral Daily   Continuous Infusions:  sodium chloride 10 mL/hr at 12/23/22 1400   PRN Meds: acetaminophen, docusate sodium, mouth rinse, polyethylene glycol  Allergies:   No Known Allergies  Social History:   Social History   Socioeconomic History   Marital status: Widowed    Spouse name: Not on file   Number of children: Not on file   Years of education: Not on file   Highest education level: Not on file  Occupational History   Not on file   Tobacco Use   Smoking status: Former    Types: Cigarettes    Quit date: 10/01/1978    Years since quitting: 44.2   Smokeless tobacco: Never  Vaping Use   Vaping Use: Never used  Substance and Sexual Activity   Alcohol use: No   Drug use: No   Sexual activity: Not on file  Other Topics Concern   Not on file  Social History Narrative   Not on file   Social Determinants of Health   Financial Resource Strain: Not on file  Food Insecurity: No Food Insecurity (11/24/2022)   Hunger Vital Sign    Worried About Running Out of Food in the Last Year: Never true    Ran Out of Food in the Last Year: Never true  Transportation Needs: No Transportation Needs (11/24/2022)   PRAPARE - Administrator, Civil Service (Medical): No    Lack of Transportation (Non-Medical): No  Physical Activity: Not on file  Stress: Not on file  Social Connections: Not on file  Intimate Partner Violence: Not At Risk (11/24/2022)   Humiliation, Afraid, Rape, and Kick questionnaire    Fear of Current or Ex-Partner: No    Emotionally Abused: No    Physically Abused: No    Sexually Abused: No    Family History:    Family History  Problem Relation Age of Onset   Kidney disease Mother    Alcoholism Father    Alcoholism Brother      ROS:  Unable to complete as patient is lethargic   Physical Exam/Data:   Vitals:   12/23/22 1414 12/23/22 1416 12/23/22 1430 12/23/22 1445  BP: 100/60 116/77 114/85 (!) 107/42  Pulse: 94 (!) 108 (!) 110 (!) 111  Resp: 14 15 15 17   Temp:      TempSrc:      SpO2: 100% 100% 100% 100%  Weight:      Height:        Intake/Output Summary (Last 24 hours) at 12/23/2022 1455 Last data filed at 12/23/2022 1400 Gross per 24 hour  Intake 766.54 ml  Output 125 ml  Net 641.54 ml      12/23/2022    5:00 AM 12/22/2022    6:04 PM 12/21/2022    4:21 AM  Last 3 Weights  Weight (lbs) 194 lb 7.1 oz 191 lb 9.3 oz 192 lb 0.3 oz  Weight (kg) 88.2 kg 86.9 kg 87.1 kg     Body  mass index is 27.9 kg/m.   Vitals:  Vitals:  12/23/22 1430 12/23/22 1445  BP: 114/85 (!) 107/42  Pulse: (!) 110 (!) 111  Resp: 15 17  Temp:    SpO2: 100% 100%   General Appearance: In no apparent distress, laying in bed, lethargic HEENT: Normocephalic, atraumatic.  Neck: Supple, trachea midline, JVD elevated to upper neck with HOB 30 degree  Cardiovascular: Irregularly irregular,  normal S1-S2,  grade III systolic murmur  Respiratory: Resting breathing unlabored, lungs sounds clear to auscultation bilaterally, no use of accessory muscles. On room air.   Gastrointestinal: Bowel sounds positive, abdomen soft, non-distended  Extremities: Able to move all extremities in bed without difficulty, BLE no edema  Musculoskeletal: Normal muscle bulk and tone Skin: Intact, warm, dry. No rashes or petechiae noted in exposed areas.  Neurologic: Lethargic, open eyes to loud voice, minimally verbal, spontaneous movement of extremity noted, neuro exam is limited Psychiatric: calm     EKG:  The EKG was personally reviewed and demonstrates:    EKG from 12/22/22 showed A fib RVR 110bpm   Telemetry:  Telemetry was personally reviewed and demonstrates:    A fib/flutter with ventricular rate of 80-90s, RVR up to 120s over the past 24 hours   Relevant CV Studies:   Echo from 11/24/22:   1. Aortic valve is thickened and does appear to have restrictive motion  during systole. On the aortic side of the valve there appears to be an  echodensity which is poorly visualize ( can not rule out reverberation  artifact) - may benefit from TEE for  clarification. The aortic valve is calcified. There is moderate  calcification of the aortic valve. There is moderate thickening of the  aortic valve. Aortic valve regurgitation is trivial. Moderate aortic valve  stenosis. Aortic valve mean gradient  measures 20.2 mmHg. Aortic valve Vmax measures 2.99 m/s, AVA by planimetry  0.791cm2.   2. Left ventricular  ejection fraction, by estimation, is 50 to 55%. The  left ventricle has low normal function. The left ventricle has no regional  wall motion abnormalities. There is mild concentric left ventricular  hypertrophy. Left ventricular  diastolic parameters are indeterminate.   3. Right ventricular systolic function is mildly reduced. The right  ventricular size is mildly enlarged.   4. Left atrial size was severely dilated.   5. Right atrial size was severely dilated.   6. A small pericardial effusion is present. The pericardial effusion is  posterior to the left ventricle.   7. The mitral valve is normal in structure. Mild mitral valve  regurgitation. No evidence of mitral stenosis.   8. Tricuspid valve regurgitation is moderate to severe.   9. The inferior vena cava is normal in size with greater than 50%  respiratory variability, suggesting right atrial pressure of 3 mmHg.    Laboratory Data:  High Sensitivity Troponin:   Recent Labs  Lab 12/22/22 1814 12/22/22 2015  TROPONINIHS 18* 17     Chemistry Recent Labs  Lab 12/17/22 0440 12/18/22 0323 12/19/22 0332 12/20/22 0312 12/21/22 0536 12/23/22 0549  NA 134* 134*   < > 137 136 136  K 3.1* 3.6   < > 3.7 4.1 3.9  CL 98 94*   < > 102 101 99  CO2 27 27   < > 28 26 26   GLUCOSE 106* 90   < > 88 92 82  BUN 12 14   < > 9 14 11   CREATININE 3.26* 5.02*   < > 5.03* 6.55* 5.75*  CALCIUM 7.8* 8.5*   < >  8.6* 8.7* 8.1*  MG 1.7 2.3  --   --  2.1  --   GFRNONAA 19* 11*   < > 11* 8* 9*  ANIONGAP 9 13   < > 7 9 11    < > = values in this interval not displayed.    Recent Labs  Lab 12/19/22 0332 12/20/22 0312 12/21/22 0536  PROT 5.5* 5.3*  --   ALBUMIN 2.3* 2.7* 2.9*  AST 16 16  --   ALT 11 10  --   ALKPHOS 49 38  --   BILITOT 0.7 0.7  --    Lipids No results for input(s): "CHOL", "TRIG", "HDL", "LABVLDL", "LDLCALC", "CHOLHDL" in the last 168 hours.  Hematology Recent Labs  Lab 12/22/22 0357 12/22/22 1814 12/22/22 2315  12/23/22 0549  WBC 7.6 7.5  --  7.8  RBC 2.99* 2.91*  --  2.96*  HGB 9.0* 9.0* 9.2* 8.8*  HCT 28.3* 28.3* 29.0* 27.9*  MCV 94.6 97.3  --  94.3  MCH 30.1 30.9  --  29.7  MCHC 31.8 31.8  --  31.5  RDW 16.8* 16.8*  --  16.4*  PLT 174 184  --  179   Thyroid No results for input(s): "TSH", "FREET4" in the last 168 hours.  BNPNo results for input(s): "BNP", "PROBNP" in the last 168 hours.  DDimer No results for input(s): "DDIMER" in the last 168 hours.   Radiology/Studies:  No results found.   Assessment and Plan:   Atrial fibrillation/flutter with RVR - appears long history of A fib/flutter followed by [name], limited records and history here, wants to transfer care to here since he moved to [facility]  - Echo 11/2022 LVEF 50-55%, trivial AI, moderate AS with mean gradient  measures 20.2 mmHg.  Vmax measures 2.99 m/s, AVA 0.791cm2. Severe LAE and RAE. Mild MR.  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 The patient's score is based upon: CHF History: 0 HTN History: 0 Diabetes History: 1 Stroke History: 0 Vascular Disease History: 1 Age Score: 2 Gender Score: 0        For questions or updates, please contact [facility] Please consult [email or web address] for contact info under     Signed, Cyndi Bender, NP  12/23/2022 2:55 PM

## 2022-12-23 NOTE — TOC Progression Note (Signed)
Transition of Care [facility]) - Initial/Assessment Note    Patient Details  Name: Justin [name] MRN: [id number] Date of Birth: Oct 10, 1945  Transition of Care [facility]) CM/SW Contact:    Ralene Bathe, LCSW Phone Number: 12/23/2022, 12:44 PM  Clinical Narrative:                 LCSW contacted patient's niece, [name].  The family is requesting assistance with completing a living will.  The niece also stated that the plan is for the patient to return home regardless of the level of assistance that the patient will require with mobility and ADL's.  LCSW messaged Chaplain and informed of the family's request for living will.  TOC following.    Expected Discharge Plan: Home w Home Health Services Barriers to Discharge: Continued Medical Work up   Patient Goals and CMS Choice Patient states their goals for this hospitalization and ongoing recovery are:: To return home          Expected Discharge Plan and Services In-house Referral: Clinical Social Work     Living arrangements for the past 2 months: Skilled Nursing Facility, Single Family Home                                      Prior Living Arrangements/Services Living arrangements for the past 2 months: Skilled Nursing Facility, Single Family Home Lives with:: Relatives Patient language and need for interpreter reviewed:: Yes Do you feel safe going back to the place where you live?: Yes      Need for Family Participation in Patient Care: Yes (Comment) Care giver support system in place?: Yes (comment)   Criminal Activity/Legal Involvement Pertinent to Current Situation/Hospitalization: No - Comment as needed  Activities of Daily Living      Permission Sought/Granted   Permission granted to share information with : Yes, Verbal Permission Granted     Permission granted to share info w AGENCY: home health agencies, primary care providers        Emotional Assessment Appearance:: Appears stated  age Attitude/Demeanor/Rapport: Engaged Affect (typically observed): Adaptable Orientation: : Oriented to Self, Oriented to Place, Oriented to  Time, Oriented to Situation Alcohol / Substance Use: Not Applicable Psych Involvement: No (comment)  Admission diagnosis:  End stage renal disease (HCC) [N18.6] Hemorrhagic shock (HCC) [R57.8] Gastrointestinal hemorrhage, unspecified gastrointestinal hemorrhage type [K92.2] Patient Active Problem List   Diagnosis Date Noted   Shock (HCC) 12/22/2022   Rectal ulcer 12/17/2022   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   Hemorrhagic shock (HCC) 12/14/2022   Acute encephalopathy 11/09/2022   Syncope 11/09/2022   Acute blood loss anemia 11/09/2022   Melena 11/09/2022   History of colonic polyps 11/09/2022   End stage renal disease (HCC) 11/09/2022   Gastric nodule 11/09/2022   Rectal bleeding 09/17/2022   Colon ulcer 09/17/2022   Lower GI bleeding 09/17/2022   GI bleed 09/16/2022   Prolonged QT interval 09/16/2022   History of prostate cancer 09/16/2022   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   History of colon polyps 09/16/2022   Lower GI bleed 09/12/2022   Acute GI bleeding 09/10/2022   Wide-complex tachycardia 06/17/2022   Hypotension 06/13/2022   Obesity (BMI 30-39.9) 06/13/2022   Perirectal abscess 06/13/2022   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   Murmur, cardiac 01/27/2022   Chronic back pain 01/27/2022   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/27/2022  GERD (gastroesophageal reflux disease) 11/11/2021   High risk medication use 11/11/2021   History of cardioversion 11/11/2021   Paroxysmal atrial fibrillation with RVR (HCC) 11/11/2021   PVC (premature ventricular contraction) 11/11/2021   Nonrheumatic aortic valve stenosis 11/06/2021   Mixed hyperlipidemia 11/06/2021   Abdominal mass 05/05/2021   First degree AV block 07/13/2020   Paroxysmal A-fib (HCC) 01/29/2020   Wound infection 01/28/2020   H/O vitrectomy  12/07/2019   Macular pucker, 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 determined by examination associated with type 2 diabetes mellitus (HCC) 10/24/2019   Stable treated proliferative diabetic retinopathy of left eye determined by examination associated with type 2 diabetes mellitus (HCC) 10/24/2019   ESRD on hemodialysis (HCC) 07/29/2017   Anticoagulated 07/13/2017   Malignant neoplasm of prostate (HCC) 07/12/2017   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   Coagulation defect, unspecified (HCC) 01/08/2017   Idiopathic gout 01/08/2017   Acute on chronic blood loss anemia 11/20/2015   Blurred vision, left eye 07/10/2011   Essential hypertension 07/10/2011   DM (diabetes mellitus) (HCC) 07/10/2011   PCP:  [name], DO Pharmacy:   [facility] - [place], [place] - [address] [place] [address] Phone: [phone] Fax: [phone]     Social Determinants of Health (SDOH) Social History: SDOH Screenings   Food Insecurity: No Food Insecurity (11/24/2022)  Housing: Low Risk  (11/24/2022)  Transportation Needs: No Transportation Needs (11/24/2022)  Utilities: Not At Risk (11/24/2022)  Tobacco Use: Medium Risk (12/20/2022)   SDOH Interventions:     Readmission Risk Interventions    11/14/2022   10:08 AM 09/18/2022    4:10 PM 06/17/2022    2:47 PM  Readmission Risk Prevention Plan  Transportation Screening Complete Complete Complete  HRI or Home Care Consult   Complete  Social Work Consult for Recovery Care Planning/Counseling   Complete  Palliative Care Screening   Not Applicable  Medication Review Oceanographer) Complete Complete Complete  PCP or Specialist appointment within 3-5 days of discharge Complete Complete   HRI or Home Care Consult Complete Complete   SW Recovery Care/Counseling Consult Complete    Palliative Care Screening Not Applicable Complete    Skilled Nursing Facility Complete Not Applicable

## 2022-12-23 NOTE — Progress Notes (Signed)
[facility] Progress Note  Subjective: yest afternoon and evening pt's BP's dropped into 70's and he got IVF"s and was moved back to ICU. BP's improved but his mental status became worse. Started on IV octreotide and PPI in case of bleed from his esoph varices. No signs of bleeding and Hb was stable at 8- 9 range.   Vitals:   12/23/22 1117 12/23/22 1130 12/23/22 1145 12/23/22 1200  BP:  139/87 119/83 (!) 109/90  Pulse:      Resp:  19 18 14   Temp: (!) 96.6 F (35.9 C)     TempSrc: Axillary     SpO2:      Weight:      Height:        Exam: Gen: pt is lethargic, very difficult to arouse CVS: Tachy, III/VI SEM LUSB Resp: CTA Abd:+BS,soft, NT/ND Ext: no edema, LUE AVF +T/B   Home meds - lipitor, nephrovite, , sensipar 30mg  po qd, eliquis, midodrine 15mg  tid, protonix, lyrica, trazodone, prns/ vits/ supps   [facility] HD: [facility] M[facility] 3h  450/600  91.6kg  2/2.5 bath  LUA AVF  Hep none - coming in under dry wt around 89kg last 2 sessions - rocaltrol 0.75 mcg po tiw - venofer 50 iv weekly - mircera 200 mcg IV q 2 wks, last 5/27, due 6/10 - last Hb 9.6 on 5/29   Assessment/Plan:  Recurrent hypotension- last night BP's dropped into 70's abruptly. No signs of new GI bleeding, started on IV octreotide and PPI given hx of esoph varices. Cont po midodrine 20 tid.  No evidence of ACS or infection. Per CCM. GI bleed - S/P 3 units PRBC's. CT angio was negative for active bleed. Holding eliquis. Had recurrent rectal bleeding/ hematochezia on 12/15/22, seen by GI and had EGD/ colonoscopy on 6/07.  EGD was negative, colonoscopy with rectal ulcer s/p clip.  Hgb stabilized in the 7s and then the 8-9 range. Transfuse prn.    ESRD - usual HD MWF. HD today.   Hypertension/volume  -  was 7kg under dry wt and w/ hypotension we gave IVF"s the last 2 days to help get volume and BP up and BP's did improve. Wts up to 78kg (3.5kg under). Keeping even w/ HD today.   Anemia esrd - +ABLA also as above.   S/p blood transfusions x 3.  Last OP esa was given 5/27. Getting darbe 200 mcg sq here on Mondays.   Metabolic bone disease -  CCa and phos are in range. Continues his home meds  here w/ rocaltrol and sensipar.   A fib/flutter - eliquis on hold at this time as risks outweigh benefits.    Acute cholecystitis s/p percutaneous cholecystostomy tube - from OSH 4/2- 4/23 and felt to be too high risk for surgical intervention.  Drain exchanged 12/01/22 and pt is to f/u with surgery at [facility] in July.  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  Currently DNR.        Justin Moselle MD [facility] 12/23/2022, 1:48 PM  Recent Labs  Lab 12/18/22 0322 12/18/22 0323 12/20/22 0312 12/21/22 0536 12/22/22 0357 12/22/22 2315 12/23/22 0549  HGB  --    < > 7.0* 8.7*   < > 9.2* 8.8*  ALBUMIN  --    < > 2.7* 2.9*  --   --   --   CALCIUM  --    < >  8.6* 8.7*  --   --  8.1*  PHOS 2.7  --   --  2.8  --   --   --   CREATININE  --    < > 5.03* 6.55*  --   --  5.75*  K  --    < > 3.7 4.1  --   --  3.9   < > = values in this interval not displayed.    No results for input(s): "IRON", "TIBC", "FERRITIN" in the last 168 hours. Inpatient medications:  sodium chloride   Intravenous Once   calcitRIOL  0.75 mcg Oral Q M,W,F   Chlorhexidine Gluconate Cloth  6 each Topical Daily   Chlorhexidine Gluconate Cloth  6 each Topical Q0600   cinacalcet  30 mg Oral Q breakfast   darbepoetin (ARANESP) injection - DIALYSIS  200 mcg Subcutaneous Q Mon-1800   midodrine  20 mg Oral Q8H   pantoprazole  40 mg Oral BID   pregabalin  75 mg Oral Daily    sodium chloride 10 mL/hr at 12/23/22 1200    acetaminophen, docusate sodium, mouth rinse, polyethylene glycol

## 2022-12-23 NOTE — Progress Notes (Signed)
NAME:  Justin [name], MRN:  [id number], DOB:  10-Apr-1946, LOS: 9 ADMISSION DATE:  12/13/2022, CONSULTATION DATE:  12/14/22 REFERRING MD:  Blinda Leatherwood , CHIEF COMPLAINT:  GIB   History of Present Illness:  77 yo male with hx of A fib on eliquis and rectal AVM with recurrent GI bleeding presented from NH with rectal bleeding on 6/3.  Developed hypotension.  Received PRBC and Kcentra.  Started on pressors.  PCCM asked to admit to ICU. CT abd/pelvis negative for active bleeding GI consulted. 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 Patient bp and bleeding improving and transferred out of ICU on 6/9.   On 6/11, patient developed sudden hypotension and ams. Patient's bp 60/40. Ordered IV fluids. PCCM re-consulted.  Pertinent  Medical History  ESRD, A fib, Rectal AVM, Chronic hypotension, Moderate aortic stenosis, Prostate cancer, DM type 2, DM retinopathy and peripheral neuropathy, Gout, HTN  Significant Hospital Events: Including procedures, antibiotic start and stop dates in addition to other pertinent events   3/07 Colonoscopy >> cecal polyp not removed, solitary ulcer in proximal transverse colon from prior polypectomy 3 clips placed, solitary ulcer mid transverse colon 2 clips placed, few polyps in transverse colon, diverticulosis in transverse colon and Lt colon, blood in rectum and recto-sigmoid colon, one active bleeding angiodysplastic lesion in distal rectum with few AVMs treated with argon plasma coagulation 4/02 to 4/23 Admit to [facility] for acute cholecystitis with septic shock and NSTEMI 4/28 to 5/14 Admit for lower GI bleed, PNA 6/02 to ED from [facility] with rectal bleeding. 6/03 progressive hem shock. 3 prbc, kcentra, CVC placed and on pressors. PCCM to admit; CT angio GI bleed negative for active bleeding, iHD overnight 6/4 intermittently on pressors, stools dark> to hematochezia, GI consult 6/6-colonoscopy and  endoscopy-results noted 6/11 reconsulted for hypotension 6/12 Patient received IV fluids, not needing pressors, transfer out of ICU today  Interim History / Subjective:  Patient able to follow commands, alert and oriented x3, BM   Objective   Blood pressure (!) 88/70, pulse (!) 181, temperature 97.9 F (36.6 C), temperature source Oral, resp. rate 18, height 5\' 10"  (1.778 m), weight 88.2 kg, SpO2 (!) 82 %.        Intake/Output Summary (Last 24 hours) at 12/23/2022 0935 Last data filed at 12/23/2022 0800 Gross per 24 hour  Intake 904.73 ml  Output 125 ml  Net 779.73 ml    Filed Weights   12/21/22 0421 12/22/22 1804 12/23/22 0500  Weight: 87.1 kg 86.9 kg 88.2 kg    Examination: General: ill appearing adult, sitting in ICU bed  HENT: Normocephalic, EOM intact, poor dentition, pink Moist MM Cardio: s1, s2, auscultated, RRR, no m/r/g Pulm: CTA to diminished, no respiratory distress  Abd: BS active  GU: anuric  Skin: warm, dry  Extremities: no edema present, moves extremities  Neuro: alert and oriented x 3, follows commands, PERRLA intact   Resolved Hospital Problem list     Assessment & Plan:   Acute lower GI bleed with history of radiation proctitis History of distal rectum and showed dysplastic lesion and rectal AVM Colonoscopy 6/7- A polyp was noted in the cecum EGD 6/6-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 No signs of active bleeding, received IV fluid bolus, no needing IV pressors  P:  Continue to trend H/H Will continue midodrine 20mg  TID  Atrial for flutter on  Eliquis Aortic stenosis Has hx of following up at [facility] EP for rate control, recommendation for ablation to assist with hypotension from hypotension  Chronic hypotension on midodrine with hemodialysis P:  Do not restart eliquis due to recurrent GI bleeding,  SCDs for VTE prophylaxis  Cardiology consulted for Afib/Atrial flutter End-stage renal disease on dialysis MWF Will receive dialysis on 6/12  P: Nephro following, appreciate assistance for HD  If patient does not become hypotensive with pressors   Hx of acute cholecystitis s/p percutaneous cholecystostomy tube Drain was not sustained 12/01/22  P: Patient has follow-up appointment with [facility] in July, continue to follow up outpatient   DMT2 Patient has not required SSI, CBGs  Have been below 100  P: DC SSI Can spot check CBGs intermittently if needed   Peripheral diabetic neuropathy P: Continue lyrica  -Continue Lyrica  Multiple pressure injuries POA BL heal unstageable pressure injuries, black eschar  P: Wound care following, appreciate assistance  Continue toradol for pain control    Best Practice (right click and "Reselect all SmartList Selections" daily)   Diet/type: NPO DVT prophylaxis: SCD GI prophylaxis: PPI Lines: Central line Foley:  N/A Code Status:  DNR Last date of multidisciplinary goals of care discussion [6/11 sister updated over phone]  [place] C. Katrinka Blazing S-ACNP

## 2022-12-23 NOTE — Progress Notes (Signed)
PROGRESS NOTE    Justin [name]  JWJ:[id number] DOB: 08-16-1945 DOA: 12/13/2022 PCP: [name], DO  Chief Complaint  Patient presents with   Rectal Bleeding    Brief Narrative:   Justin [name] is Justin [name] 77 y.o. male with Justin [name] history of atrial fibrillation on Eliquis, rectal AVM with recurrent GI bleeding, ESRD on hemodialysis, anemia, chronic hypotension, moderate aortic stenosis. Patient presented secondary to rectal bleeding with associated worsening hypotension. Patient required initial transfusion of 3 units of PRBC in addition to [facility] and was started on vasopressor support to manage hypotension from hemorrhagic shock. Patient admitted to ICU on admission. While in ICU, patient was evaluated by gastroenterology. 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 Vasopressors weaned off and patient transferred to the hospital service on 6/10.   Assessment & Plan:   Principal Problem:   Hemorrhagic shock (HCC) Active Problems:   End stage renal disease (HCC)   Rectal ulcer   Bleeding gastric varices   Shock (HCC)  Acute on Chronic Hypotension Transferred to ICU yesterday PM due to hypotension in 60's systolic.   Continue midodrine Unclear cause of acute hypotension May need to consult GI +/- CTA if concern for GI bleed recurrence (no bleeding noted since)   Acute Metabolic Encephalopathy Noted yesterday when he was hypotensive  Due to hypotension Seems resolved Delirium precautions  Acute lower GI bleed Present on admission.  Likely related to Eliquis use.   [facility] was consulted and performed upper endoscopy and colonoscopy.  Endoscopy with subepitheial nodule/gastric varices without bleeding, normal examined duodenum.   Solitary ulcer in rectum, single erosion in rectum.   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  GI recommended discussion with PCP regarding need to continue eliquis long term (recommended holding 5 days from 6/7 if recommended to continue).  At this time, no plans for resumption based on critical care notes.    Hemorrhagic shock Secondary to GI bleed and complicated by Eliquis use and underlying baseline hypotension. Patient required ICU admission 6/3 for vasopressor support. Patient managed on Levophed which was successfully weaned off on 6/7 with management of GI bleeding. Hypotensive again 6/11, unclear cause, no signs of recurrent bleeding.    Acute on chronic anemia S/p 3 units pRBC Hb appears stable   Atrial flutter/atrial fibrillation, unspecified Chronic issue. Patient is not on antiarrhythmia or rate control medication. He is managed on Eliquis as an outpatient which is now discontinued secondary to high risk of bleed with significant GI bleeding. 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  Needs outpatient EUS and biopsies.     Aortic stenosis Moderate stenosis noted on most recent Transthoracic Echocardiogram from 5/14.  Poorly visualized echodensity on echo? Will defer additional w/u to cards.   ESRD on HD Nephrology consulted. Patient is receiving hemodialysis while inpatient.   History of acute cholecystitis Recent. S/p percutaneous cholecystostomy tube placed in April of 2024 and still present. Will need outpatient follow up with surgery at [facility] in July   Diabetes mellitus type 2 Controlled. Hemoglobin A1C of 5.1%.    Diabetic neuropathy -Continue Lyrica 75 mg daily   GERD -Continue Protonix  Pressure injury Pressure Injury 11/09/22 Buttocks Left;Right;Lower Stage 2 -  Partial thickness loss of dermis presenting as Justin [name] shallow open injury with Justin [name] red,  pink wound bed without slough. 1x2 pale colored injury (Active)  11/09/22 0525  Location: Buttocks  Location Orientation: Left;Right;Lower  Staging: Stage 2 -  Partial thickness loss of dermis presenting as Alle Difabio shallow open injury with Vrinda Heckstall red, pink wound bed without slough.  Wound Description (Comments): 1x2 pale colored injury  Present on Admission: Yes     Pressure Injury 12/14/22 Heel Left;Posterior Unstageable - Full thickness tissue loss in which the base of the injury is covered by slough (yellow, tan, gray, green or brown) and/or eschar (tan, brown or black) in the wound bed. (Active)  12/14/22 0700  Location: Heel  Location Orientation: Left;Posterior  Staging: Unstageable - Full thickness tissue loss in which the base of the injury is covered by slough (yellow, tan, gray, green or brown) and/or eschar (tan, brown or black) in the wound bed.  Wound Description (Comments):   Present on Admission: Yes     Pressure Injury 12/14/22 Heel Right Unstageable - Full thickness tissue loss in which the base of the injury is covered by slough (yellow, tan, gray, green or brown) and/or eschar (tan, brown or black) in the wound bed. (Active)  12/14/22 0700  Location: Heel  Location Orientation: Right  Staging: Unstageable - Full thickness tissue loss in which the base of the injury is covered by slough (yellow, tan, gray, green or brown) and/or eschar (tan, brown or black) in the wound bed.  Wound Description (Comments):   Present on Admission: Yes      DVT prophylaxis: SCD Code Status: DNR/I Family Communication: none Disposition:   Status is: Inpatient Remains inpatient appropriate because: pending improvement   Consultants:  GI PCCM Renal  cardiology  Procedures:  Upper endoscopy Colonoscopy   Antimicrobials:  Anti-infectives (From admission, onward)    None       Subjective: No complaints today  Objective: Vitals:   12/23/22 1430 12/23/22 1445 12/23/22 1500  12/23/22 1515  BP: 114/85 (!) 107/42 113/83 (!) 113/93  Pulse: (!) 110 (!) 111 (!) 112 (!) 111  Resp: 15 17 14  (!) 21  Temp:      TempSrc:      SpO2: 100% 100% 100% 99%  Weight:      Height:        Intake/Output Summary (Last 24 hours) at 12/23/2022 1517 Last data filed at 12/23/2022 1400 Gross per 24 hour  Intake 766.54 ml  Output 125 ml  Net 641.54 ml   Filed Weights   12/21/22 0421 12/22/22 1804 12/23/22 0500  Weight: 87.1 kg 86.9 kg 88.2 kg    Examination:  General exam: Appears calm and comfortable  Respiratory system: unlabored Cardiovascular system: tachy, irregular, R groin central line Gastrointestinal system: Abdomen is nondistended, soft and nontender.  Central nervous system: Alert and oriented. No focal neurological deficits. Extremities: no LEE    Data Reviewed: I have personally reviewed following labs and imaging studies  CBC: Recent Labs  Lab 12/20/22 0312 12/21/22 0536 12/22/22 0357 12/22/22 1814 12/22/22 2315 12/23/22 0549  WBC 6.1 7.4 7.6 7.5  --  7.8  HGB 7.0* 8.7* 9.0* 9.0* 9.2* 8.8*  HCT 22.1* 27.8* 28.3* 28.3* 29.0* 27.9*  MCV 95.7 95.5 94.6 97.3  --  94.3  PLT 124* 167 174 184  --  179    Basic Metabolic Panel: Recent Labs  Lab 12/17/22 0440  12/18/22 0322 12/18/22 0323 12/19/22 0332 12/20/22 0312 12/21/22 0536 12/23/22 0549  NA 134*  --  134* 134* 137 136 136  K 3.1*  --  3.6 3.3* 3.7 4.1 3.9  CL 98  --  94* 96* 102 101 99  CO2 27  --  27 30 28 26 26   GLUCOSE 106*  --  90 119* 88 92 82  BUN 12  --  14 7* 9 14 11   CREATININE 3.26*  --  5.02* 3.63* 5.03* 6.55* 5.75*  CALCIUM 7.8*  --  8.5* 8.2* 8.6* 8.7* 8.1*  MG 1.7  --  2.3  --   --  2.1  --   PHOS  --  2.7  --   --   --  2.8  --     GFR: Estimated Creatinine Clearance: 11.8 mL/min (Justin [name]) (by C-G formula based on SCr of 5.75 mg/dL (H)).  Liver Function Tests: Recent Labs  Lab 12/19/22 0332 12/20/22 0312 12/21/22 0536  AST 16 16  --   ALT 11 10  --   ALKPHOS 49 38   --   BILITOT 0.7 0.7  --   PROT 5.5* 5.3*  --   ALBUMIN 2.3* 2.7* 2.9*    CBG: Recent Labs  Lab 12/22/22 2315 12/23/22 0319 12/23/22 0746 12/23/22 0807 12/23/22 1115  GLUCAP 85 81 65* 77 91     Recent Results (from the past 240 hour(s))  MRSA Next Gen by PCR, Nasal     Status: None   Collection Time: 12/14/22  7:00 AM   Specimen: Nasal Mucosa; Nasal Swab  Result Value Ref Range Status   MRSA by PCR Next Gen NOT DETECTED NOT DETECTED Final    Comment: (NOTE) The GeneXpert MRSA Assay (FDA approved for NASAL specimens only), is one component of Justin [name] comprehensive MRSA colonization surveillance program. It is not intended to diagnose MRSA infection nor to guide or monitor treatment for MRSA infections. Test performance is not FDA approved in patients less than 8 years old. Performed at [facility], [address] [place], [place] [address]          Radiology Studies: No results found.      Scheduled Meds:  sodium chloride   Intravenous Once   calcitRIOL  0.75 mcg Oral Q M,W,F   Chlorhexidine Gluconate Cloth  6 each Topical Daily   Chlorhexidine Gluconate Cloth  6 each Topical Q0600   cinacalcet  30 mg Oral Q breakfast   darbepoetin (ARANESP) injection - DIALYSIS  200 mcg Subcutaneous Q Mon-1800   midodrine  20 mg Oral Q8H   pantoprazole  40 mg Oral BID   pregabalin  75 mg Oral Daily   Continuous Infusions:  sodium chloride 10 mL/hr at 12/23/22 1400     LOS: 9 days    Time spent: over 30 min    Lacretia Nicks, MD [facility]   To contact the attending provider between 7A-7P or the covering provider during after hours 7P-7A, please log into the web site [email or web address] and access using universal [place] password for that web site. If you do not have the password, please call the hospital operator.  12/23/2022, 3:17 PM

## 2022-12-23 NOTE — Plan of Care (Signed)

## 2022-12-24 DIAGNOSIS — R578 Other shock: Secondary | ICD-10-CM | POA: Diagnosis not present

## 2022-12-24 LAB — COMPREHENSIVE METABOLIC PANEL
ALT: 12 U/L (ref 0–44)
AST: 22 U/L (ref 15–41)
Albumin: 2.6 g/dL — ABNORMAL LOW (ref 3.5–5.0)
Alkaline Phosphatase: 42 U/L (ref 38–126)
Anion gap: 6 (ref 5–15)
BUN: 6 mg/dL — ABNORMAL LOW (ref 8–23)
CO2: 30 mmol/L (ref 22–32)
Calcium: 8.1 mg/dL — ABNORMAL LOW (ref 8.9–10.3)
Chloride: 97 mmol/L — ABNORMAL LOW (ref 98–111)
Creatinine, Ser: 3.92 mg/dL — ABNORMAL HIGH (ref 0.61–1.24)
GFR, Estimated: 15 mL/min — ABNORMAL LOW (ref >60)
Glucose, Bld: 101 mg/dL — ABNORMAL HIGH (ref 70–99)
Potassium: 3.6 mmol/L (ref 3.5–5.1)
Sodium: 133 mmol/L — ABNORMAL LOW (ref 135–145)
Total Bilirubin: 0.7 mg/dL (ref 0.3–1.2)
Total Protein: 5.8 g/dL — ABNORMAL LOW (ref 6.5–8.1)

## 2022-12-24 LAB — CBC WITH DIFFERENTIAL/PLATELET
Abs Immature Granulocytes: 0.02 10*3/uL (ref 0.00–0.07)
Basophils Absolute: 0.1 10*3/uL (ref 0.0–0.1)
Basophils Relative: 1 %
Eosinophils Absolute: 0.1 10*3/uL (ref 0.0–0.5)
Eosinophils Relative: 2 %
HCT: 28.8 % — ABNORMAL LOW (ref 39.0–52.0)
Hemoglobin: 8.7 g/dL — ABNORMAL LOW (ref 13.0–17.0)
Immature Granulocytes: 0 %
Lymphocytes Relative: 27 %
Lymphs Abs: 1.9 10*3/uL (ref 0.7–4.0)
MCH: 29.1 pg (ref 26.0–34.0)
MCHC: 30.2 g/dL (ref 30.0–36.0)
MCV: 96.3 fL (ref 80.0–100.0)
Monocytes Absolute: 0.7 10*3/uL (ref 0.1–1.0)
Monocytes Relative: 9 %
Neutro Abs: 4.3 10*3/uL (ref 1.7–7.7)
Neutrophils Relative %: 61 %
Platelets: 191 10*3/uL (ref 150–400)
RBC: 2.99 MIL/uL — ABNORMAL LOW (ref 4.22–5.81)
RDW: 16 % — ABNORMAL HIGH (ref 11.5–15.5)
WBC: 7.1 10*3/uL (ref 4.0–10.5)
nRBC: 0 % (ref 0.0–0.2)

## 2022-12-24 LAB — PHOSPHORUS: Phosphorus: 2.6 mg/dL (ref 2.5–4.6)

## 2022-12-24 LAB — MAGNESIUM: Magnesium: 1.5 mg/dL — ABNORMAL LOW (ref 1.7–2.4)

## 2022-12-24 MED ORDER — CHLORHEXIDINE GLUCONATE CLOTH 2 % EX PADS: 6.0000 | MEDICATED_PAD | Freq: Every day | CUTANEOUS | Status: DC

## 2022-12-24 NOTE — Progress Notes (Signed)
   12/24/22 1050  Spiritual Encounters  Type of Visit Attempt (pt unavailable)  Reason for visit Advance directives  OnCall Visit No   Visited patient. Patient was sleeping. Discussed purpose with nurse who stated that in his opinion the patient is able to understand and sign forms. No family present. Will list for follow up at another time when patient is awake.

## 2022-12-24 NOTE — Progress Notes (Signed)
Transfer patient received, belongings received and assessment completed.

## 2022-12-24 NOTE — Progress Notes (Signed)
Physical Therapy Treatment Patient Details Name: Justin [name] MRN: [id number] DOB: Dec 19, 1945 Today's Date: 12/24/2022   History of Present Illness Patient is a 77 y/o male admitted 12/14/22 with recurrent GIB. went to stepdown unit, then back to ICU on 6/11 for sustained hypotension. PMH includes a-fib on Eliquis, rectal AVM, ESRD on HD, chronic hypotension, mod AS, Prostate CA, DM, retinopathy, peripheral neuropathy, gout, HTN, admission 4/2-4/23 for cholecystitis, septic shock, NSTEMI, placed cholecystostomy tube, admission 5/1-5/14 for lower GIB/PNA.    PT Comments    Pt resting upon PT arrival to room, wakes and participates with min encouragement. Pt mobilizing well to and from EOB, and tolerated repeated standing trials at EOB with max standing tolerance x1 minute. Pt limited by bilat foot pain, weakness, but is motivated to progress. Pt with BP 80s/60s throughout session, asymptomatic, RN aware. PT to continue to follow.      Recommendations for follow up therapy are one component of a multi-disciplinary discharge planning process, led by the attending physician.  Recommendations may be updated based on patient status, additional functional criteria and insurance authorization.  Follow Up Recommendations       Assistance Recommended at Discharge Frequent or constant Supervision/Assistance  Patient can return home with the following A lot of help with walking and/or transfers;A lot of help with bathing/dressing/bathroom   Equipment Recommendations  Other (comment) (defer)    Recommendations for Other Services       Precautions / Restrictions Precautions Precautions: Fall Precaution Comments: hypersensitivity/pain B feet, bilat darco shoes donned during session; watch BP (BP 80s/60s during session, asymptomatic) Restrictions Weight Bearing Restrictions: No     Mobility  Bed Mobility Overal bed mobility: Needs Assistance Bed Mobility: Supine to Sit, Sit to Supine      Supine to sit: Min guard, HOB elevated Sit to supine: Mod assist, +2 for physical assistance   General bed mobility comments: no physical assist for supine to sit, use of bedrails and increased time. mod +2 for return to supine for LE lift, boost up    Transfers Overall transfer level: Needs assistance Equipment used: Rolling walker (2 wheels) Transfers: Sit to/from Stand Sit to Stand: Mod assist, From elevated surface           General transfer comment: mod assist +2 for power up, rise, steadying. STand x2 from EOB, pt able to perform lateral stepping both directions    Ambulation/Gait                   Stairs             Wheelchair Mobility    Modified Rankin (Stroke Patients Only)       Balance Overall balance assessment: Needs assistance Sitting-balance support: Feet supported, Bilateral upper extremity supported Sitting balance-Leahy Scale: Fair Sitting balance - Comments: pt sitting up EOB at end of session   Standing balance support: Bilateral upper extremity supported, Reliant on assistive device for balance Standing balance-Leahy Scale: Poor                              Cognition Arousal/Alertness: Awake/alert Behavior During Therapy: WFL for tasks assessed/performed Overall Cognitive Status: Within Functional Limits for tasks assessed                                 General Comments: pt irritable initially but this is due to  being tired, states he prefers PT in the early morning because he stays up all night and sleeps all day        Exercises General Exercises - Lower Extremity Long Arc Quad: Strengthening, Right, Left, Seated, 10 reps    General Comments        Pertinent Vitals/Pain Pain Assessment Pain Assessment: 0-10 Faces Pain Scale: Hurts little more Pain Location: feet Pain Descriptors / Indicators: Tender, Sore Pain Intervention(s): Limited activity within patient's tolerance, Monitored  during session, Repositioned    Home Living                          Prior Function            PT Goals (current goals can now be found in the care plan section) Acute Rehab PT Goals Patient Stated Goal: To get stronger anf go home PT Goal Formulation: With patient Time For Goal Achievement: 12/30/22 Progress towards PT goals: Progressing toward goals    Frequency    Min 2X/week      PT Plan Current plan remains appropriate    Co-evaluation              AM-PAC PT "6 Clicks" Mobility   Outcome Measure  Help needed turning from your back to your side while in a flat bed without using bedrails?: A Lot Help needed moving from lying on your back to sitting on the side of a flat bed without using bedrails?: A Lot Help needed moving to and from a bed to a chair (including a wheelchair)?: A Lot Help needed standing up from a chair using your arms (e.g., wheelchair or bedside chair)?: A Lot Help needed to walk in hospital room?: A Lot Help needed climbing 3-5 steps with a railing? : Total 6 Click Score: 11    End of Session   Activity Tolerance: Patient tolerated treatment well;Patient limited by fatigue Patient left: in bed;with call bell/phone within reach;with family/visitor present;with bed alarm set (pt's niece) Nurse Communication: Mobility status PT Visit Diagnosis: Other abnormalities of gait and mobility (R26.89);Muscle weakness (generalized) (M62.81) Pain - part of body: Ankle and joints of foot     Time: 1610-9604 PT Time Calculation (min) (ACUTE ONLY): 28 min  Charges:  $Therapeutic Activity: 8-22 mins $Neuromuscular Re-education: 8-22 mins                     Justin [name], PT DPT Acute Rehabilitation Services Secure Chat Preferred  Office [phone]    Justin [name] E Justin [name] 12/24/2022, 3:13 PM

## 2022-12-24 NOTE — Progress Notes (Signed)
PROGRESS NOTE    Justin [name]  FAO:[id number] DOB: 1946/03/12 DOA: 12/13/2022 PCP: [name], DO  Chief Complaint  Patient presents with   Rectal Bleeding    Brief Narrative:   Justin [name] is Justin [name] 77 y.o. male with Tiffinie Caillier history of atrial fibrillation on Eliquis, rectal AVM with recurrent GI bleeding, ESRD on hemodialysis, anemia, chronic hypotension, moderate aortic stenosis. Patient presented secondary to rectal bleeding with associated worsening hypotension. Patient required initial transfusion of 3 units of PRBC in addition to [facility] and was started on vasopressor support to manage hypotension from hemorrhagic shock. Patient admitted to ICU on admission. While in ICU, patient was evaluated by gastroenterology. 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 Vasopressors weaned off and patient transferred to the hospital service on 6/10.   Assessment & Plan:   Principal Problem:   Hemorrhagic shock (HCC) Active Problems:   End stage renal disease (HCC)   Rectal ulcer   Bleeding gastric varices   Shock (HCC)  Acute on Chronic Hypotension Transferred to ICU 6/11 PM due to hypotension in 60's systolic.   Continue midodrine Unclear cause of acute hypotension -> currently resolved, trend  May need to consult GI +/- CTA if concern for GI bleed recurrence (no bleeding noted since)   Acute Metabolic Encephalopathy Noted when he was hypotensive  Due to hypotension? Seems improved Delirium precautions  Acute lower GI bleed Present on admission.  Likely related to Eliquis use.   [place] gastroenterology was consulted and performed upper endoscopy and colonoscopy.  Endoscopy with subepitheial nodule/gastric varices without bleeding, normal examined duodenum.   Solitary ulcer in rectum, single erosion in rectum.    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  GI recommended discussion with PCP regarding need to continue eliquis long term (recommended holding 5 days from 6/7 if recommended to continue).  At this time, no plans for resumption based on critical care and cards recs.    Hemorrhagic shock Secondary to GI bleed and complicated by Eliquis use and underlying baseline hypotension. Patient required ICU admission 6/3 for vasopressor support. Patient managed on Levophed which was successfully weaned off on 6/7 with management of GI bleeding. Hypotensive again 6/11, unclear cause, no signs of recurrent bleeding.    Acute on chronic anemia S/p 3 units pRBC Hb appears stable   Atrial flutter/atrial fibrillation, unspecified Chronic issue. Patient is not on antiarrhythmia or rate control medication. He is managed on Eliquis as an outpatient which is now discontinued secondary to high risk of bleed with significant GI bleeding. 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  Needs outpatient EUS and biopsies.     Aortic stenosis Moderate stenosis noted on most recent Transthoracic Echocardiogram from 5/14.  Poorly visualized echodensity on echo? Will defer additional w/u to cards.   ESRD on HD Nephrology consulted. Patient is receiving hemodialysis while inpatient.   History of acute cholecystitis Recent. S/p percutaneous cholecystostomy tube placed in April of 2024 and still present. Will need outpatient follow up with surgery at [facility] in July   Diabetes mellitus type 2 Controlled. Hemoglobin A1C of 5.1%.    Diabetic neuropathy -Continue Lyrica 75 mg daily  GERD -Continue Protonix   Pressure injury Pressure Injury 11/09/22 Buttocks Left;Right;Lower Stage 2 -  Partial thickness loss of dermis presenting as Willam Munford shallow  open injury with Jobina Maita red, pink wound bed without slough. 1x2 pale colored injury (Active)  11/09/22 0525  Location: Buttocks  Location Orientation: Left;Right;Lower  Staging: Stage 2 -  Partial thickness loss of dermis presenting as Bobak Oguinn shallow open injury with Trinity Hyland red, pink wound bed without slough.  Wound Description (Comments): 1x2 pale colored injury  Present on Admission: Yes     Pressure Injury 12/14/22 Heel Left;Posterior Unstageable - Full thickness tissue loss in which the base of the injury is covered by slough (yellow, tan, gray, green or brown) and/or eschar (tan, brown or black) in the wound bed. (Active)  12/14/22 0700  Location: Heel  Location Orientation: Left;Posterior  Staging: Unstageable - Full thickness tissue loss in which the base of the injury is covered by slough (yellow, tan, gray, green or brown) and/or eschar (tan, brown or black) in the wound bed.  Wound Description (Comments):   Present on Admission: Yes     Pressure Injury 12/14/22 Heel Right Unstageable - Full thickness tissue loss in which the base of the injury is covered by slough (yellow, tan, gray, green or brown) and/or eschar (tan, brown or black) in the wound bed. (Active)  12/14/22 0700  Location: Heel  Location Orientation: Right  Staging: Unstageable - Full thickness tissue loss in which the base of the injury is covered by slough (yellow, tan, gray, green or brown) and/or eschar (tan, brown or black) in the wound bed.  Wound Description (Comments):   Present on Admission: Yes      DVT prophylaxis: SCD Code Status: DNR/I Family Communication: none Disposition:   Status is: Inpatient Remains inpatient appropriate because: pending improvement   Consultants:  GI PCCM Renal  cardiology  Procedures:  Upper endoscopy Colonoscopy   Antimicrobials:  Anti-infectives (From admission, onward)    None       Subjective: No new complaints  Objective: Vitals:   12/23/22 1430 12/23/22  1445 12/23/22 1500 12/23/22 1515  BP: 114/85 (!) 107/42 113/83 (!) 113/93  Pulse: (!) 110 (!) 111 (!) 112 (!) 111  Resp: 15 17 14  (!) 21  Temp:      TempSrc:      SpO2: 100% 100% 100% 99%  Weight:      Height:        Intake/Output Summary (Last 24 hours) at 12/23/2022 1517 Last data filed at 12/23/2022 1400 Gross per 24 hour  Intake 766.54 ml  Output 125 ml  Net 641.54 ml   Filed Weights   12/21/22 0421 12/22/22 1804 12/23/22 0500  Weight: 87.1 kg 86.9 kg 88.2 kg    Examination:  General: No acute distress. Cardiovascular: RRR Lungs: unlabored Abdomen: Soft, nontender, nondistended Neurological: Alert and oriented 3. Moves all extremities 4 with equal strength. Cranial nerves II through XII grossly intact. Extremities: No clubbing or cyanosis. No edema.   Data Reviewed: I have personally reviewed following labs and imaging studies  CBC: Recent Labs  Lab 12/20/22 0312 12/21/22 0536 12/22/22 0357 12/22/22 1814 12/22/22 2315 12/23/22 0549  WBC 6.1 7.4 7.6 7.5  --  7.8  HGB 7.0* 8.7* 9.0* 9.0* 9.2* 8.8*  HCT 22.1* 27.8* 28.3* 28.3* 29.0* 27.9*  MCV 95.7 95.5 94.6 97.3  --  94.3  PLT 124* 167 174 184  --  179    Basic Metabolic Panel: Recent Labs  Lab  12/17/22 0440 12/18/22 0322 12/18/22 0323 12/19/22 0332 12/20/22 0312 12/21/22 0536 12/23/22 0549  NA 134*  --  134* 134* 137 136 136  K 3.1*  --  3.6 3.3* 3.7 4.1 3.9  CL 98  --  94* 96* 102 101 99  CO2 27  --  27 30 28 26 26   GLUCOSE 106*  --  90 119* 88 92 82  BUN 12  --  14 7* 9 14 11   CREATININE 3.26*  --  5.02* 3.63* 5.03* 6.55* 5.75*  CALCIUM 7.8*  --  8.5* 8.2* 8.6* 8.7* 8.1*  MG 1.7  --  2.3  --   --  2.1  --   PHOS  --  2.7  --   --   --  2.8  --     GFR: Estimated Creatinine Clearance: 11.8 mL/min (Aydden Cumpian) (by C-G formula based on SCr of 5.75 mg/dL (H)).  Liver Function Tests: Recent Labs  Lab 12/19/22 0332 12/20/22 0312 12/21/22 0536  AST 16 16  --   ALT 11 10  --   ALKPHOS 49 38  --    BILITOT 0.7 0.7  --   PROT 5.5* 5.3*  --   ALBUMIN 2.3* 2.7* 2.9*    CBG: Recent Labs  Lab 12/22/22 2315 12/23/22 0319 12/23/22 0746 12/23/22 0807 12/23/22 1115  GLUCAP 85 81 65* 77 91     Recent Results (from the past 240 hour(s))  MRSA Next Gen by PCR, Nasal     Status: None   Collection Time: 12/14/22  7:00 AM   Specimen: Nasal Mucosa; Nasal Swab  Result Value Ref Range Status   MRSA by PCR Next Gen NOT DETECTED NOT DETECTED Final    Comment: (NOTE) The GeneXpert MRSA Assay (FDA approved for NASAL specimens only), is one component of [place] comprehensive MRSA colonization surveillance program. It is not intended to diagnose MRSA infection nor to guide or monitor treatment for MRSA infections. Test performance is not FDA approved in patients less than 106 years old. Performed at [facility], [address], [place], [place] [address]          Radiology Studies: No results found.      Scheduled Meds:  sodium chloride   Intravenous Once   calcitRIOL  0.75 mcg Oral Q M,W,F   Chlorhexidine Gluconate Cloth  6 each Topical Daily   Chlorhexidine Gluconate Cloth  6 each Topical Q0600   cinacalcet  30 mg Oral Q breakfast   darbepoetin (ARANESP) injection - DIALYSIS  200 mcg Subcutaneous Q Mon-1800   midodrine  20 mg Oral Q8H   pantoprazole  40 mg Oral BID   pregabalin  75 mg Oral Daily   Continuous Infusions:  sodium chloride 10 mL/hr at 12/23/22 1400     LOS: 9 days    Time spent: over 30 min    Lacretia Nicks, MD [facility]   To contact the attending provider between 7A-7P or the covering provider during after hours 7P-7A, please log into the web site [email or web address] and access using universal [place] password for that web site. If you do not have the password, please call the hospital operator.  12/23/2022, 3:17 PM

## 2022-12-24 NOTE — Progress Notes (Signed)
[facility] Progress Note  Subjective: seen in ICU, much more alert, up and awake and feeding himself.   Vitals:   12/24/22 1400 12/24/22 1500 12/24/22 1509 12/24/22 1600  BP: 104/72 111/63  100/76  Pulse: (!) 105 86  85  Resp: 14 17  17   Temp:   98.5 F (36.9 C)   TempSrc:   Oral   SpO2: 98% 98%  100%  Weight:      Height:        Exam: Gen: pt is much more awake and alert. BP's better CVS: Tachy, III/VI SEM LUSB Resp: CTA Abd:+BS,soft, NT/ND Ext: no edema, LUE AVF +T/B   Home meds - lipitor, nephrovite, , sensipar 30mg  po qd, eliquis, midodrine 15mg  tid, protonix, lyrica, trazodone, prns/ vits/ supps   [facility] HD: [place] M[facility] 3h  450/600  91.6kg  2/2.5 bath  LUA AVF  Hep none - coming in under dry wt around 89kg last 2 sessions - rocaltrol 0.75 mcg po tiw - venofer 50 iv weekly - mircera 200 mcg IV q 2 wks, last 5/27, due 6/10 - last Hb 9.6 on 5/29   Assessment/Plan:  Recurrent hypotension- last night BP's dropped into 70's abruptly. No signs of new GI bleeding, started on IV octreotide and PPI given hx of esoph varices. Cont po midodrine 20 tid.  No evidence of ACS or infection. BP's better today and MS is a lot better. Per CCM.  GI bleed - S/P 3 units PRBC's. CT angio was negative for active bleed. Holding eliquis. Had recurrent rectal bleeding/ hematochezia on 12/15/22, seen by GI and had EGD/ colonoscopy on 6/07.  EGD was negative, colonoscopy with rectal ulcer s/p clip.  Hgb stabilized in the 7s and then the 8-9 range. Transfuse prn.    ESRD - usual HD MWF. HD tomorrow.   Hypertension/volume  -  was 7kg under dry wt w/ hypotension so we gave IVF"s earlier this week to help get volume and BP up and BP's did improve. Wts up to 88k which is 3-4kg under. Cont to keep even w/ HD.   Anemia esrd - +ABLA also as above.  S/p blood transfusions x 3.  Last OP esa was given 5/27. Getting darbe 200 mcg sq here on Mondays.   Metabolic bone disease -  CCa and phos are in  range. Continues his home meds  here w/ rocaltrol and sensipar.   A fib/flutter - eliquis on hold at this time as risks outweigh benefits.    Acute cholecystitis s/p percutaneous cholecystostomy tube - from OSH 4/2- 4/23 and felt to be too high risk for surgical intervention.  Drain exchanged 12/01/22 and pt is to f/u with surgery at [facility] in July.  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  Currently DNR.        Vinson Moselle MD [facility] 12/24/2022, 6:03 PM  Recent Labs  Lab 12/21/22 0536 12/22/22 0357 12/23/22 0549 12/23/22 1657 12/24/22 0759  HGB 8.7*   < > 8.8* 8.9* 8.7*  ALBUMIN 2.9*  --   --   --  2.6*  CALCIUM 8.7*  --  8.1*  --  8.1*  PHOS 2.8  --   --   --  2.6  CREATININE 6.55*  --  5.75*  --  3.92*  K 4.1  --  3.9  --  3.6   < > = values in this  interval not displayed.    No results for input(s): "IRON", "TIBC", "FERRITIN" in the last 168 hours. Inpatient medications:  sodium chloride   Intravenous Once   calcitRIOL  0.75 mcg Oral Q M,W,F   Chlorhexidine Gluconate Cloth  6 each Topical Daily   Chlorhexidine Gluconate Cloth  6 each Topical Q0600   cinacalcet  30 mg Oral Q breakfast   darbepoetin (ARANESP) injection - DIALYSIS  200 mcg Subcutaneous Q Mon-1800   midodrine  20 mg Oral Q8H   pantoprazole  40 mg Oral BID   pregabalin  75 mg Oral Daily    sodium chloride Stopped (12/24/22 0322)    acetaminophen, docusate sodium, mouth rinse, polyethylene glycol

## 2022-12-25 DIAGNOSIS — R578 Other shock: Secondary | ICD-10-CM | POA: Diagnosis not present

## 2022-12-25 LAB — COMPREHENSIVE METABOLIC PANEL
ALT: 11 U/L (ref 0–44)
AST: 16 U/L (ref 15–41)
Albumin: 2.6 g/dL — ABNORMAL LOW (ref 3.5–5.0)
Alkaline Phosphatase: 46 U/L (ref 38–126)
Anion gap: 9 (ref 5–15)
BUN: 9 mg/dL (ref 8–23)
CO2: 29 mmol/L (ref 22–32)
Calcium: 8.2 mg/dL — ABNORMAL LOW (ref 8.9–10.3)
Chloride: 98 mmol/L (ref 98–111)
Creatinine, Ser: 5.15 mg/dL — ABNORMAL HIGH (ref 0.61–1.24)
GFR, Estimated: 11 mL/min — ABNORMAL LOW (ref >60)
Glucose, Bld: 109 mg/dL — ABNORMAL HIGH (ref 70–99)
Potassium: 3.5 mmol/L (ref 3.5–5.1)
Sodium: 136 mmol/L (ref 135–145)
Total Bilirubin: 0.7 mg/dL (ref 0.3–1.2)
Total Protein: 5.8 g/dL — ABNORMAL LOW (ref 6.5–8.1)

## 2022-12-25 LAB — CBC WITH DIFFERENTIAL/PLATELET
Abs Immature Granulocytes: 0.02 10*3/uL (ref 0.00–0.07)
Basophils Absolute: 0.1 10*3/uL (ref 0.0–0.1)
Basophils Relative: 1 %
Eosinophils Absolute: 0.2 10*3/uL (ref 0.0–0.5)
Eosinophils Relative: 2 %
HCT: 28.2 % — ABNORMAL LOW (ref 39.0–52.0)
Hemoglobin: 8.9 g/dL — ABNORMAL LOW (ref 13.0–17.0)
Immature Granulocytes: 0 %
Lymphocytes Relative: 27 %
Lymphs Abs: 1.9 10*3/uL (ref 0.7–4.0)
MCH: 30.9 pg (ref 26.0–34.0)
MCHC: 31.6 g/dL (ref 30.0–36.0)
MCV: 97.9 fL (ref 80.0–100.0)
Monocytes Absolute: 0.7 10*3/uL (ref 0.1–1.0)
Monocytes Relative: 9 %
Neutro Abs: 4.3 10*3/uL (ref 1.7–7.7)
Neutrophils Relative %: 61 %
Platelets: 197 10*3/uL (ref 150–400)
RBC: 2.88 MIL/uL — ABNORMAL LOW (ref 4.22–5.81)
RDW: 15.9 % — ABNORMAL HIGH (ref 11.5–15.5)
WBC: 7.2 10*3/uL (ref 4.0–10.5)
nRBC: 0 % (ref 0.0–0.2)

## 2022-12-25 LAB — PHOSPHORUS: Phosphorus: 2.9 mg/dL (ref 2.5–4.6)

## 2022-12-25 LAB — MAGNESIUM: Magnesium: 1.7 mg/dL (ref 1.7–2.4)

## 2022-12-25 MED ORDER — ALBUMIN HUMAN 25 % IV SOLN
INTRAVENOUS | Status: AC
  Administered 2022-12-25: 25 g
  Filled 2022-12-25: qty 100

## 2022-12-25 NOTE — Progress Notes (Signed)
PROGRESS NOTE    Justin [name]  ZOX:[id number] DOB: March 15, 1946 DOA: 12/13/2022 PCP: [name], DO  Chief Complaint  Patient presents with   Rectal Bleeding    Brief Narrative:   Justin [name] is Justin [name] 77 y.o. male with Justin [name] history of atrial fibrillation on Eliquis, rectal AVM with recurrent GI bleeding, ESRD on hemodialysis, anemia, chronic hypotension, moderate aortic stenosis. Patient presented secondary to rectal bleeding with associated worsening hypotension. Patient required initial transfusion of 3 units of PRBC in addition to [facility] and was started on vasopressor support to manage hypotension from hemorrhagic shock. Patient admitted to ICU on admission. While in ICU, patient was evaluated by gastroenterology. 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 Vasopressors weaned off and patient transferred to the hospital service on 6/10.   Assessment & Plan:   Principal Problem:   Hemorrhagic shock (HCC) Active Problems:   End stage renal disease (HCC)   Rectal ulcer   Bleeding gastric varices   Shock (HCC)  Acute on Chronic Hypotension Transferred to ICU 6/11 PM due to hypotension in 60's systolic.   Continue midodrine Unclear cause of acute hypotension -> currently resolved, trend  May need to consult GI +/- CTA if concern for GI bleed recurrence (no bleeding noted since)   Acute Metabolic Encephalopathy Noted when he was hypotensive  Due to hypotension? Seems improved Delirium precautions  Acute lower GI bleed Present on admission.  Likely related to Eliquis use.   [place] gastroenterology was consulted and performed upper endoscopy and colonoscopy.  Endoscopy with subepitheial nodule/gastric varices without bleeding, normal examined duodenum.   Solitary ulcer in rectum, single erosion in rectum.    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  GI recommended discussion with PCP regarding need to continue eliquis long term (recommended holding 5 days from 6/7 if recommended to continue).  At this time, no plans for resumption based on critical care and cards recs.    Hemorrhagic shock Secondary to GI bleed and complicated by Eliquis use and underlying baseline hypotension. Patient required ICU admission 6/3 for vasopressor support. Patient managed on Levophed which was successfully weaned off on 6/7 with management of GI bleeding. Hypotensive again 6/11, unclear cause, no signs of recurrent bleeding.    Acute on chronic anemia S/p 3 units pRBC Hb appears stable   Atrial flutter/atrial fibrillation, unspecified Chronic issue. Patient is not on antiarrhythmia or rate control medication. He is managed on Eliquis as an outpatient which is now discontinued secondary to high risk of bleed with significant GI bleeding. 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  Needs outpatient EUS and biopsies.     Aortic stenosis Moderate stenosis noted on most recent Transthoracic Echocardiogram from 5/14.  Poorly visualized echodensity on echo? Will defer additional w/u to cards.   ESRD on HD Nephrology consulted. Patient is receiving hemodialysis while inpatient.   History of acute cholecystitis Recent. S/p percutaneous cholecystostomy tube placed in April of 2024 and still present. Will need outpatient follow up with surgery at [facility] in July   Diabetes mellitus type 2 Controlled. Hemoglobin A1C of 5.1%.    Diabetic  neuropathy -Continue Lyrica 75 mg daily   GERD -Continue Protonix   Pressure injury Pressure Injury 11/09/22 Buttocks Left;Right;Lower Stage 2 -  Partial thickness loss  of dermis presenting as [name] shallow open injury with [name] red, pink wound bed without slough. 1x2 pale colored injury (Active)  11/09/22 0525  Location: Buttocks  Location Orientation: Left;Right;Lower  Staging: Stage 2 -  Partial thickness loss of dermis presenting as [name] shallow open injury with [name] red, pink wound bed without slough.  Wound Description (Comments): 1x2 pale colored injury  Present on Admission: Yes     Pressure Injury 12/14/22 Heel Left;Posterior Unstageable - Full thickness tissue loss in which the base of the injury is covered by slough (yellow, tan, gray, green or brown) and/or eschar (tan, brown or black) in the wound bed. (Active)  12/14/22 0700  Location: Heel  Location Orientation: Left;Posterior  Staging: Unstageable - Full thickness tissue loss in which the base of the injury is covered by slough (yellow, tan, gray, green or brown) and/or eschar (tan, brown or black) in the wound bed.  Wound Description (Comments):   Present on Admission: Yes     Pressure Injury 12/14/22 Heel Right Unstageable - Full thickness tissue loss in which the base of the injury is covered by slough (yellow, tan, gray, green or brown) and/or eschar (tan, brown or black) in the wound bed. (Active)  12/14/22 0700  Location: Heel  Location Orientation: Right  Staging: Unstageable - Full thickness tissue loss in which the base of the injury is covered by slough (yellow, tan, gray, green or brown) and/or eschar (tan, brown or black) in the wound bed.  Wound Description (Comments):   Present on Admission: Yes      DVT prophylaxis: SCD Code Status: DNR/I Family Communication: none Disposition:   Status is: Inpatient Remains inpatient appropriate because: pending improvement   Consultants:  GI PCCM Renal  cardiology  Procedures:  Upper endoscopy Colonoscopy   Antimicrobials:  Anti-infectives (From admission, onward)    None       Subjective: No complaints  Objective: Vitals:    12/23/22 1430 12/23/22 1445 12/23/22 1500 12/23/22 1515  BP: 114/85 (!) 107/42 113/83 (!) 113/93  Pulse: (!) 110 (!) 111 (!) 112 (!) 111  Resp: 15 17 14  (!) 21  Temp:      TempSrc:      SpO2: 100% 100% 100% 99%  Weight:      Height:        Intake/Output Summary (Last 24 hours) at 12/23/2022 1517 Last data filed at 12/23/2022 1400 Gross per 24 hour  Intake 766.54 ml  Output 125 ml  Net 641.54 ml   Filed Weights   12/21/22 0421 12/22/22 1804 12/23/22 0500  Weight: 87.1 kg 86.9 kg 88.2 kg    Examination:  General: No acute distress. Seen during dialysis  Cardiovascular: RRR Lungs: unlabored Abdomen: Soft, nontender, nondistended. Neurological: Alert and oriented 3. Moves all extremities 4 with equal strength. Cranial nerves II through XII grossly intact. Extremities: No clubbing or cyanosis. No edema.   Data Reviewed: I have personally reviewed following labs and imaging studies  CBC: Recent Labs  Lab 12/20/22 0312 12/21/22 0536 12/22/22 0357 12/22/22 1814 12/22/22 2315 12/23/22 0549  WBC 6.1 7.4 7.6 7.5  --  7.8  HGB 7.0* 8.7* 9.0* 9.0* 9.2* 8.8*  HCT 22.1* 27.8* 28.3* 28.3* 29.0* 27.9*  MCV 95.7 95.5 94.6 97.3  --  94.3  PLT 124* 167 174 184  --  179    Basic Metabolic Panel: Recent Labs  Lab 12/17/22 0440 12/18/22 0322 12/18/22 0323 12/19/22 0332 12/20/22 0312 12/21/22 0536 12/23/22 0549  NA 134*  --  134* 134* 137 136 136  K 3.1*  --  3.6 3.3* 3.7 4.1 3.9  CL 98  --  94* 96* 102 101 99  CO2 27  --  27 30 28 26 26   GLUCOSE 106*  --  90 119* 88 92 82  BUN 12  --  14 7* 9 14 11   CREATININE 3.26*  --  5.02* 3.63* 5.03* 6.55* 5.75*  CALCIUM 7.8*  --  8.5* 8.2* 8.6* 8.7* 8.1*  MG 1.7  --  2.3  --   --  2.1  --   PHOS  --  2.7  --   --   --  2.8  --     GFR: Estimated Creatinine Clearance: 11.8 mL/min (Justin [name]) (by C-G formula based on SCr of 5.75 mg/dL (H)).  Liver Function Tests: Recent Labs  Lab 12/19/22 0332 12/20/22 0312 12/21/22 0536  AST  16 16  --   ALT 11 10  --   ALKPHOS 49 38  --   BILITOT 0.7 0.7  --   PROT 5.5* 5.3*  --   ALBUMIN 2.3* 2.7* 2.9*    CBG: Recent Labs  Lab 12/22/22 2315 12/23/22 0319 12/23/22 0746 12/23/22 0807 12/23/22 1115  GLUCAP 85 81 65* 77 91     Recent Results (from the past 240 hour(s))  MRSA Next Gen by PCR, Nasal     Status: None   Collection Time: 12/14/22  7:00 AM   Specimen: Nasal Mucosa; Nasal Swab  Result Value Ref Range Status   MRSA by PCR Next Gen NOT DETECTED NOT DETECTED Final    Comment: (NOTE) The GeneXpert MRSA Assay (FDA approved for NASAL specimens only), is one component of [place] comprehensive MRSA colonization surveillance program. It is not intended to diagnose MRSA infection nor to guide or monitor treatment for MRSA infections. Test performance is not FDA approved in patients less than 72 years old. Performed at [facility], [address] [place], [place] [address]          Radiology Studies: No results found.      Scheduled Meds:  sodium chloride   Intravenous Once   calcitRIOL  0.75 mcg Oral Q M,W,F   Chlorhexidine Gluconate Cloth  6 each Topical Daily   Chlorhexidine Gluconate Cloth  6 each Topical Q0600   cinacalcet  30 mg Oral Q breakfast   darbepoetin (ARANESP) injection - DIALYSIS  200 mcg Subcutaneous Q Mon-1800   midodrine  20 mg Oral Q8H   pantoprazole  40 mg Oral BID   pregabalin  75 mg Oral Daily   Continuous Infusions:  sodium chloride 10 mL/hr at 12/23/22 1400     LOS: 9 days    Time spent: over 30 min    Lacretia Nicks, MD [facility]   To contact the attending provider between 7A-7P or the covering provider during after hours 7P-7A, please log into the web site [email or web address] and access using universal [place] password for that web site. If you do not have the password, please call the hospital operator.  12/23/2022, 3:17 PM

## 2022-12-25 NOTE — Progress Notes (Signed)
   12/25/22 1207  Spiritual Encounters  Type of Visit Follow up  Care provided to: Patient  Referral source Chaplain team  Reason for visit Advance directives   Chaplain attempted to deliver Advance Directive paper work to PT as a follow up (chaplain attempted yesterday and PT was unavailable).  PT is unavailable today as he is receiving dialysis. Will continue to follow up

## 2022-12-25 NOTE — Progress Notes (Signed)
OT Cancellation Note  Patient Details Name: Justin [name] MRN: [id number] DOB: 07/13/1946   Cancelled Treatment:    Reason Eval/Treat Not Completed: Patient at procedure or test/ unavailable (Pt currently off unit for HD. OT will attempt to see pt later this day for acute skilled OT eval as pt is appropriate and schedule allows.)  [name] "[name]" M., OTR/L, MA Acute Rehab [phone]   Lendon Colonel 12/25/2022, 8:39 AM

## 2022-12-25 NOTE — Progress Notes (Signed)
Received patient in bed to unit.  Alert and oriented.  Informed consent signed and in chart.   TX duration: 3.5hrs  Patient tolerated well.  Alert, without acute distress.  Hand-off given to patient's nurse.   Access used: L AVF Access issues: None  Total UF removed: 0ml Medication(s) given: Albumin 25g IV  Post HD weight: 87.9kg   12/25/22 1200  Vitals  Temp 97.7 F (36.5 C)  Temp Source Oral  BP 110/79  MAP (mmHg) 89  BP Location Right Arm  BP Method Automatic  Patient Position (if appropriate) Lying  Pulse Rate (!) 111  Pulse Rate Source Monitor  ECG Heart Rate (!) 112  Resp 16  Oxygen Therapy  SpO2 98 %  O2 Device Room Air  During Treatment Monitoring  Intra-Hemodialysis Comments Tx completed;Tolerated well  Post Treatment  Dialyzer Clearance Heavily streaked  Duration of HD Treatment -hour(s) 3.5 hour(s)  Liters Processed 74  Fluid Removed (mL) 0 mL  Tolerated HD Treatment Yes  AVG/AVF Arterial Site Held (minutes) 6 minutes  AVG/AVF Venous Site Held (minutes) 6 minutes    [facility]

## 2022-12-25 NOTE — Progress Notes (Signed)
[facility] Progress Note  Subjective: seen in HD, no c/o's.   Vitals:   12/25/22 0903 12/25/22 0930 12/25/22 1000 12/25/22 1035  BP: 100/72 109/74 128/86 124/83  Pulse: (!) 103 (!) 102 (!) 110 (!) 110  Resp: 19 17 20 18   Temp:      TempSrc:      SpO2: 96% 97% 97% 96%  Weight:      Height:        Exam: Gen: awake and alert. BP's better CVS: Tachy, III/VI SEM LUSB Resp: CTA Abd:+BS,soft, NT/ND Ext: no edema, LUE AVF +T/B   Home meds - lipitor, nephrovite, , sensipar 30mg  po qd, eliquis, midodrine 15mg  tid, protonix, lyrica, trazodone, prns/ vits/ supps   OP HD: [facility] M[facility] 3h  450/600  91.6kg  2/2.5 bath  LUA AVF  Hep none - coming in under dry wt around 89kg last 2 sessions - rocaltrol 0.75 mcg po tiw - venofer 50 iv weekly - mircera 200 mcg IV q 2 wks, last 5/27, due 6/10 - last Hb 9.6 on 5/29   Assessment/Plan:  Recurrent hypotension- BP's dropped off, not sure cause. Improved and was moved out of ICU. Cont po midodrine 20 tid.  GI bleed - S/P 3 units PRBC's. CT angio was negative for active bleed. Holding eliquis. Had recurrent rectal bleeding/ hematochezia on 12/15/22, seen by GI and had EGD/ colonoscopy on 6/07.  EGD was negative, colonoscopy with rectal ulcer s/p clip.  Hgb stabilized in the 7s and then the 8-9 range. Transfuse prn.    ESRD - usual HD MWF. HD today.   Hypertension/volume  -  was 7kg under dry wt w/ hypotension so we gave IVF"s earlier this week to help get volume and BP up and BP's did improve. Wts up to 88k which is 3-4kg under. Cont to keep even w/ HD.   Anemia esrd - +ABLA also as above.  S/p blood transfusions x 3.  Last OP esa was given 5/27. Getting darbe 200 mcg sq here on Mondays.   Metabolic bone disease -  CCa and phos are in range. Continues his home meds  here w/ rocaltrol and sensipar.   A fib/flutter - eliquis on hold at this time as risks outweigh benefits.    Acute cholecystitis s/p percutaneous cholecystostomy tube -  from OSH 4/2- 4/23 and felt to be too high risk for surgical intervention.  Drain exchanged 12/01/22 and pt is to f/u with surgery at [facility] in July.  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  Currently DNR.        Vinson Moselle MD [facility] 12/25/2022, 11:07 AM  Recent Labs  Lab 12/24/22 0759 12/25/22 0212  HGB 8.7* 8.9*  ALBUMIN 2.6* 2.6*  CALCIUM 8.1* 8.2*  PHOS 2.6 2.9  CREATININE 3.92* 5.15*  K 3.6 3.5    No results for input(s): "IRON", "TIBC", "FERRITIN" in the last 168 hours. Inpatient medications:  sodium chloride   Intravenous Once   calcitRIOL  0.75 mcg Oral Q M,W,F   Chlorhexidine Gluconate Cloth  6 each Topical Daily   Chlorhexidine Gluconate Cloth  6 each Topical Q0600   Chlorhexidine Gluconate Cloth  6 each Topical Q0600   cinacalcet  30 mg Oral Q breakfast   darbepoetin (ARANESP) injection - DIALYSIS  200 mcg Subcutaneous Q Mon-1800   midodrine  20 mg Oral Q8H   pantoprazole  40 mg  Oral BID   pregabalin  75 mg Oral Daily    sodium chloride Stopped (12/24/22 0322)    acetaminophen, docusate sodium, mouth rinse, polyethylene glycol

## 2022-12-26 DIAGNOSIS — R578 Other shock: Secondary | ICD-10-CM | POA: Diagnosis not present

## 2022-12-26 NOTE — Evaluation (Signed)
Occupational Therapy Evaluation Patient Details Name: Justin [name] MRN: [id number] DOB: May 18, 1946 Today's Date: 12/26/2022   History of Present Illness Patient is a 77 y/o male admitted 12/14/22 with recurrent GIB. went to stepdown unit, then back to ICU on 6/11 for sustained hypotension. PMH includes a-fib on Eliquis, rectal AVM, ESRD on HD, chronic hypotension, mod AS, Prostate CA, DM, retinopathy, peripheral neuropathy, gout, HTN, admission 4/2-4/23 for cholecystitis, septic shock, NSTEMI, placed cholecystostomy tube, admission 5/1-5/14 for lower GIB/PNA.   Clinical Impression   Pt admitted for concerns listed above. PTA pt was at a SNF receiving therapy, requiring increased assist for all mobility and ADL's. This session pt adamantly refusing OOB or EOB activities. He required mod A for rolling side to side in bed and was only able to complete 5 reps of supine exercises. OT recommending return to SNF to maximize independence and safety. OT will follow acutely.       Recommendations for follow up therapy are one component of a multi-disciplinary discharge planning process, led by the attending physician.  Recommendations may be updated based on patient status, additional functional criteria and insurance authorization.   Assistance Recommended at Discharge Frequent or constant Supervision/Assistance  Patient can return home with the following Two people to help with walking and/or transfers;A lot of help with bathing/dressing/bathroom;Assistance with cooking/housework;Direct supervision/assist for medications management;Direct supervision/assist for financial management;Assist for transportation;Help with stairs or ramp for entrance    Functional Status Assessment  Patient has had a recent decline in their functional status and demonstrates the ability to make significant improvements in function in a reasonable and predictable amount of time.  Equipment Recommendations  None recommended by  OT    Recommendations for Other Services       Precautions / Restrictions Precautions Precautions: Fall Precaution Comments: hypersensitivity/pain B feet, bilat darco shoes donned during session; watch BP Restrictions Weight Bearing Restrictions: No      Mobility Bed Mobility Overal bed mobility: Needs Assistance Bed Mobility: Rolling Rolling: Mod assist         General bed mobility comments: Needed assist for in bed mobility, refusing EOB or OOB activities    Transfers                   General transfer comment: pt refused      Balance                                           ADL either performed or assessed with clinical judgement   ADL Overall ADL's : Needs assistance/impaired Eating/Feeding: Independent;Sitting   Grooming: Set up;Sitting   Upper Body Bathing: Set up;Sitting   Lower Body Bathing: Maximal assistance;Sitting/lateral leans   Upper Body Dressing : Min guard;Sitting   Lower Body Dressing: Maximal assistance;Sitting/lateral leans       Toileting- Clothing Manipulation and Hygiene: Maximal assistance;Sitting/lateral lean         General ADL Comments: Pt refusing OOB activities this session, unable to assess transfer ability for toileting ADL's.     Vision Baseline Vision/History: 1 Wears glasses Ability to See in Adequate Light: 0 Adequate Patient Visual Report: No change from baseline Vision Assessment?: No apparent visual deficits     Perception Perception Perception Tested?: No   Praxis Praxis Praxis tested?: Not tested    Pertinent Vitals/Pain Pain Assessment Pain Assessment: Faces Faces Pain Scale: Hurts little more  Pain Location: feet Pain Descriptors / Indicators: Tender, Sore Pain Intervention(s): Limited activity within patient's tolerance, Monitored during session, Repositioned     Hand Dominance Right   Extremity/Trunk Assessment Upper Extremity Assessment Upper Extremity Assessment:  Overall WFL for tasks assessed   Lower Extremity Assessment Lower Extremity Assessment: Defer to PT evaluation RLE Deficits / Details: hypersensitivity bilat feet due to neuropathy, pressure sore at heel AROM WFL, strength grossly 3+/5 RLE Sensation: history of peripheral neuropathy LLE Deficits / Details: hypersensitivity bilat feet due to neuropathy, pressure sore at heel AROM WFL, strength grossly 2+/5 LLE Sensation: history of peripheral neuropathy       Communication Communication Communication: No difficulties   Cognition Arousal/Alertness: Awake/alert Behavior During Therapy: WFL for tasks assessed/performed Overall Cognitive Status: Within Functional Limits for tasks assessed                                 General Comments: Pt irritable stating he just wants to sleep     General Comments  VSS on RA    Exercises Other Exercises Other Exercises: supine leg raises, x5 Other Exercises: supine ankle pumps x5 Other Exercises: supine heel slides x5   Shoulder Instructions      Home Living Family/patient expects to be discharged to:: Skilled nursing facility                                 Additional Comments: Pt lived at home alone prior to 06/2022 hospitalization. Since that time he has either been at [facility] or additional hospitalizations.      Prior Functioning/Environment Prior Level of Function : Needs assist             Mobility Comments: Prior to 06/2022 hospitalization, pt lived alone mod I with SPC vs RW. Still driving. Pt has not stood on his feet in at least 3 months. ADLs Comments: Independent prior to 07/2022.  Now, pt is dependent wtih LE adls and min assist with UE adls.        OT Problem List: Decreased strength;Decreased activity tolerance;Impaired balance (sitting and/or standing);Decreased knowledge of use of DME or AE;Decreased safety awareness;Impaired sensation;Pain      OT Treatment/Interventions: Self-care/ADL  training;Therapeutic exercise;Energy conservation;DME and/or AE instruction;Therapeutic activities;Patient/family education;Balance training    OT Goals(Current goals can be found in the care plan section) Acute Rehab OT Goals Patient Stated Goal: To go to sleep OT Goal Formulation: With patient Time For Goal Achievement: 01/09/23 Potential to Achieve Goals: Good ADL Goals Pt Will Perform Grooming: with modified independence;sitting Pt Will Perform Lower Body Bathing: with min assist;sitting/lateral leans;sit to/from stand Pt Will Perform Lower Body Dressing: with min assist;sitting/lateral leans Pt Will Transfer to Toilet: with mod assist;stand pivot transfer Pt Will Perform Toileting - Clothing Manipulation and hygiene: with min assist;sitting/lateral leans  OT Frequency: Min 2X/week    Co-evaluation              AM-PAC OT "6 Clicks" Daily Activity     Outcome Measure Help from another person eating meals?: A Little Help from another person taking care of personal grooming?: A Little Help from another person toileting, which includes using toliet, bedpan, or urinal?: A Lot Help from another person bathing (including washing, rinsing, drying)?: A Lot Help from another person to put on and taking off regular upper body clothing?: A Little Help from another  person to put on and taking off regular lower body clothing?: A Lot 6 Click Score: 15   End of Session    Activity Tolerance:   Patient left:    OT Visit Diagnosis: Unsteadiness on feet (R26.81);Other abnormalities of gait and mobility (R26.89);Muscle weakness (generalized) (M62.81)                Time: 8119-1478 OT Time Calculation (min): 9 min Charges:  OT General Charges $OT Visit: 1 Visit OT Evaluation $OT Eval Moderate Complexity: 1 Mod  Jaecion Dempster Bing Plume, OTR/L [place] [facility]  Cleatus Goodin Elane Bing Plume 12/26/2022, 12:49 PM

## 2022-12-26 NOTE — Progress Notes (Signed)
PROGRESS NOTE    Justin [name]  ZOX:[id number] DOB: 1946-07-02 DOA: 12/13/2022 PCP: [name], DO  Chief Complaint  Patient presents with   Rectal Bleeding    Brief Narrative:   Justin [name] is Justin [name] 77 y.o. male with Justin [name] history of atrial fibrillation on Eliquis, rectal AVM with recurrent GI bleeding, ESRD on hemodialysis, anemia, chronic hypotension, moderate aortic stenosis. Patient presented secondary to rectal bleeding with associated worsening hypotension. Patient required initial transfusion of 3 units of PRBC in addition to [facility] and was started on vasopressor support to manage hypotension from hemorrhagic shock. Patient admitted to ICU on admission. While in ICU, patient was evaluated by gastroenterology. 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 Vasopressors weaned off and patient transferred to the hospital service on 6/10.   Assessment & Plan:   Principal Problem:   Hemorrhagic shock (HCC) Active Problems:   End stage renal disease (HCC)   Rectal ulcer   Bleeding gastric varices   Shock (HCC)  Acute on Chronic Hypotension Transferred to ICU 6/11 PM due to hypotension in 60's systolic.   Continue midodrine Unclear cause of acute hypotension -> currently resolved, trend  May need to consult GI +/- CTA if concern for GI bleed recurrence (no bleeding noted since)   Acute Metabolic Encephalopathy Noted when he was hypotensive  Due to hypotension? Seems improved Delirium precautions  Acute lower GI bleed Present on admission.  Likely related to Eliquis use.   [facility] was consulted and performed upper endoscopy and colonoscopy.  Endoscopy with subepitheial nodule/gastric varices without bleeding, normal examined duodenum.   Solitary ulcer in rectum, single erosion in rectum.    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  GI recommended discussion with PCP regarding need to continue eliquis long term (recommended holding 5 days from 6/7 if recommended to continue).  At this time, no plans for resumption based on critical care and cards recs.    Hemorrhagic shock Secondary to GI bleed and complicated by Eliquis use and underlying baseline hypotension. Patient required ICU admission 6/3 for vasopressor support. Patient managed on Levophed which was successfully weaned off on 6/7 with management of GI bleeding. Hypotensive again 6/11, unclear cause, no signs of recurrent bleeding.    Acute on chronic anemia S/p 3 units pRBC Hb appears stable   Atrial flutter/atrial fibrillation, unspecified Chronic issue. Patient is not on antiarrhythmia or rate control medication. He is managed on Eliquis as an outpatient which is now discontinued secondary to high risk of bleed with significant GI bleeding. 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  Needs outpatient EUS and biopsies.     Aortic stenosis Moderate stenosis noted on most recent Transthoracic Echocardiogram from 5/14.  Poorly visualized echodensity on echo? Will defer additional w/u to cards.   ESRD on HD Nephrology consulted. Patient is receiving hemodialysis while inpatient.   History of acute cholecystitis Recent. S/p percutaneous cholecystostomy tube placed in April of 2024 and still present. Will need outpatient follow up with surgery at [facility] in July   Diabetes mellitus type 2 Controlled. Hemoglobin A1C of 5.1%.    Diabetic  neuropathy -Continue Lyrica 75 mg daily   GERD -Continue Protonix   Pressure injury Pressure Injury 11/09/22 Buttocks Left;Right;Lower Stage 2 -  Partial thickness loss  of dermis presenting as Justin [name] shallow open injury with Justin [name] red, pink wound bed without slough. 1x2 pale colored injury (Active)  11/09/22 0525  Location: Buttocks  Location Orientation: Left;Right;Lower  Staging: Stage 2 -  Partial thickness loss of dermis presenting as Justin [name] shallow open injury with Sydney Hasten red, pink wound bed without slough.  Wound Description (Comments): 1x2 pale colored injury  Present on Admission: Yes     Pressure Injury 12/14/22 Heel Left;Posterior Unstageable - Full thickness tissue loss in which the base of the injury is covered by slough (yellow, tan, gray, green or brown) and/or eschar (tan, brown or black) in the wound bed. (Active)  12/14/22 0700  Location: Heel  Location Orientation: Left;Posterior  Staging: Unstageable - Full thickness tissue loss in which the base of the injury is covered by slough (yellow, tan, gray, green or brown) and/or eschar (tan, brown or black) in the wound bed.  Wound Description (Comments):   Present on Admission: Yes     Pressure Injury 12/14/22 Heel Right Unstageable - Full thickness tissue loss in which the base of the injury is covered by slough (yellow, tan, gray, green or brown) and/or eschar (tan, brown or black) in the wound bed. (Active)  12/14/22 0700  Location: Heel  Location Orientation: Right  Staging: Unstageable - Full thickness tissue loss in which the base of the injury is covered by slough (yellow, tan, gray, green or brown) and/or eschar (tan, brown or black) in the wound bed.  Wound Description (Comments):   Present on Admission: Yes     Pressure Injury 12/24/22 Thigh Anterior;Distal;Right Stage 2 -  Partial thickness loss of dermis presenting as Justin [name] shallow open injury with Justin [name] red, pink wound bed without slough. Healing (Active)  12/24/22 2000  Location: Thigh  Location Orientation: Anterior;Distal;Right  Staging: Stage 2 -  Partial thickness loss of dermis presenting as Justin [name] shallow open injury with Justin [name] red, pink wound bed without  slough.  Wound Description (Comments): Healing  Present on Admission: Yes      DVT prophylaxis: SCD Code Status: DNR/I Family Communication: none Disposition:   Status is: Inpatient Remains inpatient appropriate because: pending improvement   Consultants:  GI PCCM Renal  cardiology  Procedures:  Upper endoscopy Colonoscopy   Antimicrobials:  Anti-infectives (From admission, onward)    None       Subjective: No complaints  Objective: Vitals:   12/26/22 0310 12/26/22 0452 12/26/22 0523 12/26/22 0818  BP: 93/62 93/64 95/63  (!) 89/64  Pulse: (!) 112 (!) 111 (!) 113 (!) 113  Resp: 16 16 16 19   Temp: 97.9 F (36.6 C) 98.4 F (36.9 C) 98 F (36.7 C) 98.2 F (36.8 C)  TempSrc: Oral Oral  Oral  SpO2: 99% 99% 99% 99%  Weight:      Height:        Intake/Output Summary (Last 24 hours) at 12/26/2022 1538 Last data filed at 12/26/2022 0500 Gross per 24 hour  Intake 240 ml  Output 180 ml  Net 60 ml   Filed Weights   12/25/22 0742 12/25/22 1158 12/25/22 1900  Weight: 87.9 kg 87.9 kg 87.9 kg    Examination:  General: No acute distress. Cardiovascular: RRR Lungs: unlabored Abdomen: Soft, nontender, nondistended  Neurological: Alert and oriented 3. Moves all extremities 4 with equal strength. Cranial nerves II through  XII grossly intact. Extremities: No clubbing or cyanosis. No edema.   Data Reviewed: I have personally reviewed following labs and imaging studies  CBC: Recent Labs  Lab 12/22/22 0357 12/22/22 1814 12/22/22 2315 12/23/22 0549 12/23/22 1657 12/24/22 0759 12/25/22 0212  WBC 7.6 7.5  --  7.8  --  7.1 7.2  NEUTROABS  --   --   --   --   --  4.3 4.3  HGB 9.0* 9.0* 9.2* 8.8* 8.9* 8.7* 8.9*  HCT 28.3* 28.3* 29.0* 27.9* 27.9* 28.8* 28.2*  MCV 94.6 97.3  --  94.3  --  96.3 97.9  PLT 174 184  --  179  --  191 197    Basic Metabolic Panel: Recent Labs  Lab 12/20/22 0312 12/21/22 0536 12/23/22 0549 12/24/22 0759 12/25/22 0212  NA  137 136 136 133* 136  K 3.7 4.1 3.9 3.6 3.5  CL 102 101 99 97* 98  CO2 28 26 26 30 29   GLUCOSE 88 92 82 101* 109*  BUN 9 14 11  6* 9  CREATININE 5.03* 6.55* 5.75* 3.92* 5.15*  CALCIUM 8.6* 8.7* 8.1* 8.1* 8.2*  MG  --  2.1  --  1.5* 1.7  PHOS  --  2.8  --  2.6 2.9    GFR: Estimated Creatinine Clearance: 13.2 mL/min (Lorrin Bodner) (by C-G formula based on SCr of 5.15 mg/dL (H)).  Liver Function Tests: Recent Labs  Lab 12/20/22 0312 12/21/22 0536 12/24/22 0759 12/25/22 0212  AST 16  --  22 16  ALT 10  --  12 11  ALKPHOS 38  --  42 46  BILITOT 0.7  --  0.7 0.7  PROT 5.3*  --  5.8* 5.8*  ALBUMIN 2.7* 2.9* 2.6* 2.6*    CBG: Recent Labs  Lab 12/22/22 2315 12/23/22 0319 12/23/22 0746 12/23/22 0807 12/23/22 1115  GLUCAP 85 81 65* 77 91     No results found for this or any previous visit (from the past 240 hour(s)).        Radiology Studies: No results found.      Scheduled Meds:  sodium chloride   Intravenous Once   calcitRIOL  0.75 mcg Oral Q M,W,F   Chlorhexidine Gluconate Cloth  6 each Topical Daily   Chlorhexidine Gluconate Cloth  6 each Topical Q0600   Chlorhexidine Gluconate Cloth  6 each Topical Q0600   cinacalcet  30 mg Oral Q breakfast   darbepoetin (ARANESP) injection - DIALYSIS  200 mcg Subcutaneous Q Mon-1800   midodrine  20 mg Oral Q8H   pantoprazole  40 mg Oral BID   pregabalin  75 mg Oral Daily   Continuous Infusions:  sodium chloride Stopped (12/24/22 0322)     LOS: 12 days    Time spent: over 30 min    Lacretia Nicks, MD [facility]   To contact the attending provider between 7A-7P or the covering provider during after hours 7P-7A, please log into the web site [email or web address] and access using universal [place] password for that web site. If you do not have the password, please call the hospital operator.  12/26/2022, 3:38 PM

## 2022-12-26 NOTE — Progress Notes (Signed)
[facility] Progress Note  Subjective: seen in room, no c/o's.   Vitals:   12/26/22 0110 12/26/22 0310 12/26/22 0523 12/26/22 0818  BP: 99/70 93/62 95/63  (!) 89/64  Pulse: (!) 111 (!) 112 (!) 113 (!) 113  Resp: 18 16 16 19   Temp: 97.8 F (36.6 C) 97.9 F (36.6 C) 98 F (36.7 C) 98.2 F (36.8 C)  TempSrc: Oral Oral  Oral  SpO2: 100% 99% 99% 99%  Weight:      Height:        Exam: Gen: awake and alert. BP's better CVS: Tachy, III/VI SEM LUSB Resp: CTA Abd:+BS,soft, NT/ND Ext: no edema, LUE AVF +T/B   Home meds - lipitor, nephrovite, , sensipar 30mg  po qd, eliquis, midodrine 15mg  tid, protonix, lyrica, trazodone, prns/ vits/ supps   OP HD: [facility] 3h  450/600  91.6kg  2/2.5 bath  LUA AVF  Hep none - coming in under dry wt around 89kg last 2 sessions - rocaltrol 0.75 mcg po tiw - venofer 50 iv weekly - mircera 200 mcg IV q 2 wks, last 5/27, due 6/10 - last Hb 9.6 on 5/29   Assessment/Plan: Acute on chronic hypotension- in shock initially related to acute GIB and rx'd in ICU from 6/02- 6/10. Pt moved to floor. On 6/11 BP's had dropped again and pt moved back to ICU. Unclear cause, pt improved and was moved out of ICU. MS improved again as well. Cont po midodrine 20 tid.  GI bleed - S/P 3 units PRBC's. CT angio was negative for active bleed. Eliquis was held. Had recurrent rectal bleeding/ hematochezia on 12/15/22, seen by GI and had EGD/ colonoscopy on 6/07.  EGD was negative, colonoscopy with rectal ulcer s/p clip.  Hgb stabilized in the 7s and now is in the 8-9 range. Transfuse prn.    ESRD - usual HD MWF. Next HD Monday.   Volume  -  was 7kg under dry wt w/ hypotension, improved w/ IVF"s and not pulling fluid w/ HD. Pt remains euvolemic and wt's are not increasing, will cont to keep even w/ HD.   Anemia esrd - +ABLA also as above.  S/p blood transfusions x 3.  Last OP esa was given 5/27. Getting darbe 200 mcg sq here on Mondays.   Metabolic bone disease -   CCa and phos are in range. Continues his home meds  here w/ rocaltrol and sensipar.   A fib/flutter - eliquis on hold at this time as risks outweigh benefits.    Acute cholecystitis s/p percutaneous cholecystostomy tube - from OSH 4/2- 4/23 and felt to be too high risk for surgical intervention.  Drain exchanged 12/01/22 and pt is to f/u with surgery at [facility] in July.  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  Currently DNR.        Vinson Moselle MD [facility] 12/26/2022, 12:13 PM  Recent Labs  Lab 12/24/22 0759 12/25/22 0212  HGB 8.7* 8.9*  ALBUMIN 2.6* 2.6*  CALCIUM 8.1* 8.2*  PHOS 2.6 2.9  CREATININE 3.92* 5.15*  K 3.6 3.5    No results for input(s): "IRON", "TIBC", "FERRITIN" in the last 168 hours. Inpatient medications:  sodium chloride   Intravenous Once   calcitRIOL  0.75 mcg Oral Q M,W,F   Chlorhexidine Gluconate Cloth  6 each Topical Daily   Chlorhexidine Gluconate Cloth  6 each Topical Q0600   Chlorhexidine Gluconate  Cloth  6 each Topical [id number]   cinacalcet  30 mg Oral Q breakfast   darbepoetin (ARANESP) injection - DIALYSIS  200 mcg Subcutaneous Q Mon-1800   midodrine  20 mg Oral Q8H   pantoprazole  40 mg Oral BID   pregabalin  75 mg Oral Daily    sodium chloride Stopped (12/24/22 0322)    acetaminophen, docusate sodium, mouth rinse, polyethylene glycol

## 2022-12-27 DIAGNOSIS — R578 Other shock: Secondary | ICD-10-CM | POA: Diagnosis not present

## 2022-12-27 LAB — COMPREHENSIVE METABOLIC PANEL
ALT: 10 U/L (ref 0–44)
AST: 16 U/L (ref 15–41)
Albumin: 2.8 g/dL — ABNORMAL LOW (ref 3.5–5.0)
Alkaline Phosphatase: 44 U/L (ref 38–126)
Anion gap: 10 (ref 5–15)
BUN: 8 mg/dL (ref 8–23)
CO2: 29 mmol/L (ref 22–32)
Calcium: 8 mg/dL — ABNORMAL LOW (ref 8.9–10.3)
Chloride: 97 mmol/L — ABNORMAL LOW (ref 98–111)
Creatinine, Ser: 5.38 mg/dL — ABNORMAL HIGH (ref 0.61–1.24)
GFR, Estimated: 10 mL/min — ABNORMAL LOW (ref >60)
Glucose, Bld: 93 mg/dL (ref 70–99)
Potassium: 3.8 mmol/L (ref 3.5–5.1)
Sodium: 136 mmol/L (ref 135–145)
Total Bilirubin: 0.8 mg/dL (ref 0.3–1.2)
Total Protein: 6.2 g/dL — ABNORMAL LOW (ref 6.5–8.1)

## 2022-12-27 LAB — PHOSPHORUS: Phosphorus: 3.4 mg/dL (ref 2.5–4.6)

## 2022-12-27 LAB — MAGNESIUM: Magnesium: 1.5 mg/dL — ABNORMAL LOW (ref 1.7–2.4)

## 2022-12-27 MED ORDER — CHLORHEXIDINE GLUCONATE CLOTH 2 % EX PADS: 6.0000 | MEDICATED_PAD | Freq: Every day | CUTANEOUS | Status: DC

## 2022-12-27 NOTE — Progress Notes (Signed)
[facility] Progress Note  Subjective: seen in room, no c/o's.   Vitals:   12/26/22 2213 12/27/22 0132 12/27/22 0642 12/27/22 0903  BP: 116/83 109/77 104/73 114/76  Pulse: (!) 112 (!) 111 (!) 113 (!) 114  Resp: 18 18 18 18   Temp: (!) 97.4 F (36.3 C) 98.2 F (36.8 C) 98.2 F (36.8 C) 98.6 F (37 C)  TempSrc: Oral Oral Oral   SpO2: 100% 100% 96% 98%  Weight:      Height:        Exam: Gen: awake and alert. SBP 100-115 range CVS: Tachy, III/VI SEM LUSB Resp: CTA Abd:+BS,soft, NT/ND Ext: no edema, LUE AVF +T/B   Home meds - lipitor, nephrovite, , sensipar 30mg  po qd, eliquis, midodrine 15mg  tid, protonix, lyrica, trazodone, prns/ vits/ supps   OP HD: [facility] 3h  450/600  91.6kg  2/2.5 bath  LUA AVF  Hep none - coming in under dry wt around 89kg last 2 sessions - rocaltrol 0.75 mcg po tiw - venofer 50 iv weekly - mircera 200 mcg IV q 2 wks, last 5/27, due 6/10 - last Hb 9.6 on 5/29   Assessment/Plan: Acute on chronic hypotension- in shock initially related to acute GIB and rx'd in ICU from 6/02- 6/10. Pt moved to floor. On 6/11 BP's had dropped again and pt moved back to ICU briefly. Unclear cause, BP and pt improved and was again moved out of ICU. Cont po midodrine 20 tid.  GI bleed - S/P 3 units PRBC's. CT angio was negative for active bleed. Eliquis was held. Had recurrent rectal bleeding/ hematochezia on 12/15/22, seen by GI and had EGD/ colonoscopy on 6/07.  EGD was negative, colonoscopy with rectal ulcer s/p clip.  Hgb stabilized in the 7s and now is in the 8-9 range. Transfuse prn.    ESRD - usual HD MWF. Next HD Monday.   Volume  -  was 7kg under dry wt w/ hypotension, improved w/ IVF"s and not pulling fluid w/ HD. Pt remains euvolemic and wt's are not increasing, will cont to keep even w/ HD.   Anemia esrd - +ABLA also as above.  S/p blood transfusions x 3.  Last OP esa was given 5/27. Getting darbe 200 mcg sq here on Mondays.   Metabolic bone  disease -  CCa and phos are in range. Continues his home meds  here w/ rocaltrol and sensipar.   A fib/flutter - eliquis on hold at this time as risks outweigh benefits.    Acute cholecystitis s/p percutaneous cholecystostomy tube - from OSH 4/2- 4/23 and felt to be too high risk for surgical intervention.  Drain exchanged 12/01/22 and pt is to f/u with surgery at [facility] in July.  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  Currently DNR.        Vinson Moselle MD [facility] 12/27/2022, 11:53 AM  Recent Labs  Lab 12/24/22 0759 12/25/22 0212 12/27/22 0421  HGB 8.7* 8.9*  --   ALBUMIN 2.6* 2.6* 2.8*  CALCIUM 8.1* 8.2* 8.0*  PHOS 2.6 2.9 3.4  CREATININE 3.92* 5.15* 5.38*  K 3.6 3.5 3.8    No results for input(s): "IRON", "TIBC", "FERRITIN" in the last 168 hours. Inpatient medications:  sodium chloride   Intravenous Once   calcitRIOL  0.75 mcg Oral Q M,W,F   Chlorhexidine Gluconate Cloth  6 each Topical Daily   Chlorhexidine Gluconate  Cloth  6 each Topical Q0600   Chlorhexidine Gluconate Cloth  6 each Topical Q0600   cinacalcet  30 mg Oral Q breakfast   darbepoetin (ARANESP) injection - DIALYSIS  200 mcg Subcutaneous Q Mon-1800   midodrine  20 mg Oral Q8H   pantoprazole  40 mg Oral BID   pregabalin  75 mg Oral Daily    sodium chloride Stopped (12/24/22 0322)    acetaminophen, docusate sodium, mouth rinse, polyethylene glycol

## 2022-12-27 NOTE — Progress Notes (Signed)
   12/26/22 2213  Assess: MEWS Score  Temp (!) 97.4 F (36.3 C)  BP 116/83  MAP (mmHg) 93  Pulse Rate (!) 112  Resp 18  Level of Consciousness Alert  SpO2 100 %  O2 Device Room Air  Patient Activity (if Appropriate) In bed  Assess: MEWS Score  MEWS Temp 0  MEWS Systolic 0  MEWS Pulse 2  MEWS RR 0  MEWS LOC 0  MEWS Score 2  MEWS Score Color Yellow  Assess: if the MEWS score is Yellow or Red  Were vital signs taken at a resting state? Yes  Focused Assessment No change from prior assessment  Does the patient meet 2 or more of the SIRS criteria? No  Does the patient have a confirmed or suspected source of infection? No  MEWS guidelines implemented  No, previously yellow, continue vital signs every 4 hours  Assess: SIRS CRITERIA  SIRS Temperature  0  SIRS Pulse 1  SIRS Respirations  0  SIRS WBC 0  SIRS Score Sum  1

## 2022-12-27 NOTE — Progress Notes (Signed)
PROGRESS NOTE    Justin [name]  GNF:[id number] DOB: Mar 14, 1946 DOA: 12/13/2022 PCP: [name], DO  Chief Complaint  Patient presents with   Rectal Bleeding    Brief Narrative:   Justin [name] is Justin [name] 77 y.o. male with Justin [name] history of atrial fibrillation on Eliquis, rectal AVM with recurrent GI bleeding, ESRD on hemodialysis, anemia, chronic hypotension, moderate aortic stenosis. Patient presented secondary to rectal bleeding with associated worsening hypotension. Patient required initial transfusion of 3 units of PRBC in addition to [facility] and was started on vasopressor support to manage hypotension from hemorrhagic shock. Patient admitted to ICU on admission. While in ICU, patient was evaluated by gastroenterology. 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 Vasopressors weaned off and patient transferred to the hospital service on 6/10.   Assessment & Plan:   Principal Problem:   Hemorrhagic shock (HCC) Active Problems:   End stage renal disease (HCC)   Rectal ulcer   Bleeding gastric varices   Shock (HCC)  Acute on Chronic Hypotension Transferred to ICU 6/11 PM due to hypotension in 60's systolic.   Continue midodrine Unclear cause of acute hypotension -> currently resolved, trend  May need to consult GI +/- CTA if concern for GI bleed recurrence (no bleeding noted since)   Acute Metabolic Encephalopathy Noted when he was hypotensive  Due to hypotension? Seems improved Delirium precautions  Acute lower GI bleed Present on admission.  Likely related to Eliquis use.   Justin [name] gastroenterology was consulted and performed upper endoscopy and colonoscopy.  Endoscopy with subepitheial nodule/gastric varices without bleeding, normal examined duodenum.   Solitary ulcer in rectum, single erosion in rectum.    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  GI recommended discussion with PCP regarding need to continue eliquis long term (recommended holding 5 days from 6/7 if recommended to continue).  At this time, no plans for resumption based on critical care and cards recs.    Hemorrhagic shock Secondary to GI bleed and complicated by Eliquis use and underlying baseline hypotension. Patient required ICU admission 6/3 for vasopressor support. Patient managed on Levophed which was successfully weaned off on 6/7 with management of GI bleeding. Hypotensive again 6/11, unclear cause, no signs of recurrent bleeding.    Acute on chronic anemia S/p 3 units pRBC Hb appears stable   Atrial flutter/atrial fibrillation, unspecified Chronic issue. Patient is not on antiarrhythmia or rate control medication. He is managed on Eliquis as an outpatient which is now discontinued secondary to high risk of bleed with significant GI bleeding. 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  Needs outpatient EUS and biopsies.     Aortic stenosis Moderate stenosis noted on most recent Transthoracic Echocardiogram from 5/14.  Poorly visualized echodensity on echo? Will defer additional w/u to cards.   ESRD on HD Nephrology consulted. Patient is receiving hemodialysis while inpatient.   History of acute cholecystitis Recent. S/p percutaneous cholecystostomy tube placed in April of 2024 and still present. Will need outpatient follow up with surgery at [facility] in July   Diabetes mellitus type 2 Controlled. Hemoglobin A1C of 5.1%.  Diabetic neuropathy -Continue Lyrica 75 mg daily   GERD -Continue Protonix   Pressure injury Pressure Injury 11/09/22 Buttocks Left;Right;Lower Stage 2 -  Partial  thickness loss of dermis presenting as Justin [name] shallow open injury with Justin [name] red, pink wound bed without slough. 1x2 pale colored injury (Active)  11/09/22 0525  Location: Buttocks  Location Orientation: Left;Right;Lower  Staging: Stage 2 -  Partial thickness loss of dermis presenting as Justin [name] shallow open injury with Justin [name] red, pink wound bed without slough.  Wound Description (Comments): 1x2 pale colored injury  Present on Admission: Yes     Pressure Injury 12/14/22 Heel Left;Posterior Unstageable - Full thickness tissue loss in which the base of the injury is covered by slough (yellow, tan, gray, green or brown) and/or eschar (tan, brown or black) in the wound bed. (Active)  12/14/22 0700  Location: Heel  Location Orientation: Left;Posterior  Staging: Unstageable - Full thickness tissue loss in which the base of the injury is covered by slough (yellow, tan, gray, green or brown) and/or eschar (tan, brown or black) in the wound bed.  Wound Description (Comments):   Present on Admission: Yes     Pressure Injury 12/14/22 Heel Right Unstageable - Full thickness tissue loss in which the base of the injury is covered by slough (yellow, tan, gray, green or brown) and/or eschar (tan, brown or black) in the wound bed. (Active)  12/14/22 0700  Location: Heel  Location Orientation: Right  Staging: Unstageable - Full thickness tissue loss in which the base of the injury is covered by slough (yellow, tan, gray, green or brown) and/or eschar (tan, brown or black) in the wound bed.  Wound Description (Comments):   Present on Admission: Yes     Pressure Injury 12/24/22 Thigh Anterior;Distal;Right Stage 2 -  Partial thickness loss of dermis presenting as Justin [name] shallow open injury with Justin [name] red, pink wound bed without slough. Healing (Active)  12/24/22 2000  Location: Thigh  Location Orientation: Anterior;Distal;Right  Staging: Stage 2 -  Partial thickness loss of dermis presenting as Justin [name] shallow open injury with Justin [name] red, pink  wound bed without slough.  Wound Description (Comments): Healing  Present on Admission: Yes      DVT prophylaxis: SCD Code Status: DNR/I Family Communication: none Disposition:   Status is: Inpatient Remains inpatient appropriate because: pending improvement   Consultants:  GI PCCM Renal  cardiology  Procedures:  Upper endoscopy Colonoscopy   Antimicrobials:  Anti-infectives (From admission, onward)    None       Subjective: No new complaints  Objective: Vitals:   12/26/22 2213 12/27/22 0132 12/27/22 0642 12/27/22 0903  BP: 116/83 109/77 104/73 114/76  Pulse: (!) 112 (!) 111 (!) 113 (!) 114  Resp: 18 18 18 18   Temp: (!) 97.4 F (36.3 C) 98.2 F (36.8 C) 98.2 F (36.8 C) 98.6 F (37 C)  TempSrc: Oral Oral Oral   SpO2: 100% 100% 96% 98%  Weight:      Height:        Intake/Output Summary (Last 24 hours) at 12/27/2022 1605 Last data filed at 12/27/2022 0900 Gross per 24 hour  Intake 360 ml  Output 400 ml  Net -40 ml   Filed Weights   12/25/22 0742 12/25/22 1158 12/25/22 1900  Weight: 87.9 kg 87.9 kg 87.9 kg    Examination:  General: No acute distress. Cardiovascular: irregularly irregular, rates in 90's-100's Lungs: unlabored Neurological: Alert and oriented 3. Moves all extremities. Cranial nerves II through XII grossly intact.  Data Reviewed: I have personally reviewed following labs and imaging studies  CBC: Recent Labs  Lab 12/22/22 0357 12/22/22 1814 12/22/22 2315 12/23/22 0549 12/23/22 1657 12/24/22 0759 12/25/22 0212  WBC 7.6 7.5  --  7.8  --  7.1 7.2  NEUTROABS  --   --   --   --   --  4.3 4.3  HGB 9.0* 9.0* 9.2* 8.8* 8.9* 8.7* 8.9*  HCT 28.3* 28.3* 29.0* 27.9* 27.9* 28.8* 28.2*  MCV 94.6 97.3  --  94.3  --  96.3 97.9  PLT 174 184  --  179  --  191 197    Basic Metabolic Panel: Recent Labs  Lab 12/21/22 0536 12/23/22 0549 12/24/22 0759 12/25/22 0212 12/27/22 0421  NA 136 136 133* 136 136  K 4.1 3.9 3.6 3.5 3.8   CL 101 99 97* 98 97*  CO2 26 26 30 29 29   GLUCOSE 92 82 101* 109* 93  BUN 14 11 6* 9 8  CREATININE 6.55* 5.75* 3.92* 5.15* 5.38*  CALCIUM 8.7* 8.1* 8.1* 8.2* 8.0*  MG 2.1  --  1.5* 1.7 1.5*  PHOS 2.8  --  2.6 2.9 3.4    GFR: Estimated Creatinine Clearance: 12.6 mL/min (Treina Arscott) (by C-G formula based on SCr of 5.38 mg/dL (H)).  Liver Function Tests: Recent Labs  Lab 12/21/22 0536 12/24/22 0759 12/25/22 0212 12/27/22 0421  AST  --  22 16 16   ALT  --  12 11 10   ALKPHOS  --  42 46 44  BILITOT  --  0.7 0.7 0.8  PROT  --  5.8* 5.8* 6.2*  ALBUMIN 2.9* 2.6* 2.6* 2.8*    CBG: Recent Labs  Lab 12/22/22 2315 12/23/22 0319 12/23/22 0746 12/23/22 0807 12/23/22 1115  GLUCAP 85 81 65* 77 91     No results found for this or any previous visit (from the past 240 hour(s)).        Radiology Studies: No results found.      Scheduled Meds:  sodium chloride   Intravenous Once   calcitRIOL  0.75 mcg Oral Q M,W,F   Chlorhexidine Gluconate Cloth  6 each Topical Daily   Chlorhexidine Gluconate Cloth  6 each Topical Q0600   Chlorhexidine Gluconate Cloth  6 each Topical Q0600   [START ON 12/28/2022] Chlorhexidine Gluconate Cloth  6 each Topical Q0600   cinacalcet  30 mg Oral Q breakfast   darbepoetin (ARANESP) injection - DIALYSIS  200 mcg Subcutaneous Q Mon-1800   midodrine  20 mg Oral Q8H   pantoprazole  40 mg Oral BID   pregabalin  75 mg Oral Daily   Continuous Infusions:  sodium chloride Stopped (12/24/22 0322)     LOS: 13 days    Time spent: over 30 min    Lacretia Nicks, MD [facility]   To contact the attending provider between 7A-7P or the covering provider during after hours 7P-7A, please log into the web site [email or web address] and access using universal [place] password for that web site. If you do not have the password, please call the hospital operator.  12/27/2022, 4:05 PM

## 2022-12-28 DIAGNOSIS — R578 Other shock: Secondary | ICD-10-CM | POA: Diagnosis not present

## 2022-12-28 LAB — CBC WITH DIFFERENTIAL/PLATELET
Abs Immature Granulocytes: 0.02 10*3/uL (ref 0.00–0.07)
Basophils Absolute: 0 10*3/uL (ref 0.0–0.1)
Basophils Relative: 1 %
Eosinophils Absolute: 0.2 10*3/uL (ref 0.0–0.5)
Eosinophils Relative: 3 %
HCT: 29.5 % — ABNORMAL LOW (ref 39.0–52.0)
Hemoglobin: 9.2 g/dL — ABNORMAL LOW (ref 13.0–17.0)
Immature Granulocytes: 0 %
Lymphocytes Relative: 29 %
Lymphs Abs: 1.9 10*3/uL (ref 0.7–4.0)
MCH: 29.6 pg (ref 26.0–34.0)
MCHC: 31.2 g/dL (ref 30.0–36.0)
MCV: 94.9 fL (ref 80.0–100.0)
Monocytes Absolute: 0.6 10*3/uL (ref 0.1–1.0)
Monocytes Relative: 9 %
Neutro Abs: 3.8 10*3/uL (ref 1.7–7.7)
Neutrophils Relative %: 58 %
Platelets: 221 10*3/uL (ref 150–400)
RBC: 3.11 MIL/uL — ABNORMAL LOW (ref 4.22–5.81)
RDW: 16 % — ABNORMAL HIGH (ref 11.5–15.5)
WBC: 6.5 10*3/uL (ref 4.0–10.5)
nRBC: 0 % (ref 0.0–0.2)

## 2022-12-28 LAB — COMPREHENSIVE METABOLIC PANEL
ALT: 11 U/L (ref 0–44)
AST: 17 U/L (ref 15–41)
Albumin: 2.6 g/dL — ABNORMAL LOW (ref 3.5–5.0)
Alkaline Phosphatase: 50 U/L (ref 38–126)
Anion gap: 12 (ref 5–15)
BUN: 15 mg/dL (ref 8–23)
CO2: 27 mmol/L (ref 22–32)
Calcium: 7.9 mg/dL — ABNORMAL LOW (ref 8.9–10.3)
Chloride: 97 mmol/L — ABNORMAL LOW (ref 98–111)
Creatinine, Ser: 6.72 mg/dL — ABNORMAL HIGH (ref 0.61–1.24)
GFR, Estimated: 8 mL/min — ABNORMAL LOW (ref >60)
Glucose, Bld: 78 mg/dL (ref 70–99)
Potassium: 3.8 mmol/L (ref 3.5–5.1)
Sodium: 136 mmol/L (ref 135–145)
Total Bilirubin: 1 mg/dL (ref 0.3–1.2)
Total Protein: 5.9 g/dL — ABNORMAL LOW (ref 6.5–8.1)

## 2022-12-28 LAB — PHOSPHORUS: Phosphorus: 3.6 mg/dL (ref 2.5–4.6)

## 2022-12-28 LAB — MAGNESIUM: Magnesium: 1.6 mg/dL — ABNORMAL LOW (ref 1.7–2.4)

## 2022-12-28 MED ORDER — MAGNESIUM SULFATE 2 GM/50ML IV SOLN
2.0000 g | Freq: Once | INTRAVENOUS | Status: AC
  Administered 2022-12-28: 2 g via INTRAVENOUS
  Filled 2022-12-28: qty 50

## 2022-12-28 MED ORDER — METOPROLOL TARTRATE 12.5 MG HALF TABLET
12.5000 mg | ORAL_TABLET | Freq: Two times a day (BID) | ORAL | Status: DC
  Administered 2022-12-28 – 2022-12-29 (×2): 12.5 mg via ORAL
  Filled 2022-12-28 (×2): qty 1

## 2022-12-28 NOTE — Progress Notes (Signed)
[facility] Progress Note   Subjective: Seen in room. No new complaints. For dialysis today   Objective Vitals:   12/27/22 1723 12/27/22 2105 12/28/22 0533 12/28/22 0942  BP: (!) 115/95 112/75 101/76 103/65  Pulse: (!) 118 (!) 111 (!) 115 (!) 114  Resp: 18 17 17 18   Temp: 98.5 F (36.9 C) 98.3 F (36.8 C) 98.6 F (37 C) 98.3 F (36.8 C)  TempSrc:  Oral Oral Oral  SpO2: 98% 100% 100% 100%  Weight:   86.5 kg   Height:         Additional Objective Labs: Basic Metabolic Panel: Recent Labs  Lab 12/25/22 0212 12/27/22 0421 12/28/22 0450  NA 136 136 136  K 3.5 3.8 3.8  CL 98 97* 97*  CO2 29 29 27   GLUCOSE 109* 93 78  BUN 9 8 15   CREATININE 5.15* 5.38* 6.72*  CALCIUM 8.2* 8.0* 7.9*  PHOS 2.9 3.4 3.6   CBC: Recent Labs  Lab 12/22/22 1814 12/22/22 2315 12/23/22 0549 12/23/22 1657 12/24/22 0759 12/25/22 0212 12/28/22 0450  WBC 7.5  --  7.8  --  7.1 7.2 6.5  NEUTROABS  --   --   --   --  4.3 4.3 3.8  HGB 9.0*   < > 8.8*   < > 8.7* 8.9* 9.2*  HCT 28.3*   < > 27.9*   < > 28.8* 28.2* 29.5*  MCV 97.3  --  94.3  --  96.3 97.9 94.9  PLT 184  --  179  --  191 197 221   < > = values in this interval not displayed.   Blood Culture    Component Value Date/Time   SDES ABSCESS 06/15/2022 1344   SPECREQUEST PERIRECTAL 06/15/2022 1344   CULT  06/15/2022 1344    FEW ESCHERICHIA COLI NO ANAEROBES ISOLATED Performed at [facility], [address] [place], [place] [address]    REPTSTATUS 06/20/2022 FINAL 06/15/2022 1344     Physical Exam General: Alert, sitting up in bed, nad Heart: Tachy, SEM Lungs: Clear bilaterally  Abdomen: soft non-tender Extremities: no LE edema Dialysis Access: LUE AVF +bruit   Medications:  sodium chloride Stopped (12/24/22 0322)    sodium chloride   Intravenous Once   calcitRIOL  0.75 mcg Oral Q M,W,F   Chlorhexidine Gluconate Cloth  6 each Topical Daily   Chlorhexidine Gluconate Cloth  6 each Topical Q0600    Chlorhexidine Gluconate Cloth  6 each Topical Q0600   Chlorhexidine Gluconate Cloth  6 each Topical Q0600   cinacalcet  30 mg Oral Q breakfast   darbepoetin (ARANESP) injection - DIALYSIS  200 mcg Subcutaneous Q Mon-1800   metoprolol tartrate  12.5 mg Oral BID   midodrine  20 mg Oral Q8H   pantoprazole  40 mg Oral BID   pregabalin  75 mg Oral Daily     OP HD: [facility] M[facility] 3h  450/600  91.6kg  2/2.5 bath  LUA AVF  Hep none - coming in under dry wt around 89kg last 2 sessions - rocaltrol 0.75 mcg po tiw - venofer 50 iv weekly - mircera 200 mcg IV q 2 wks, last 5/27, due 6/10 - last Hb 9.6 on 5/29   Assessment/Plan: Acute on chronic hypotension- in shock initially related to acute GIB and rx'd in ICU from 6/02- 6/10. Pt moved to floor. On 6/11 BP's had dropped again and pt moved back to ICU briefly. Unclear cause, BP and pt improved  and was again moved out of ICU. Cont po midodrine 20 tid.  GI bleed - S/P 3 units PRBC's. CT angio was negative for active bleed. Eliquis was held. Had recurrent rectal bleeding/ hematochezia on 12/15/22, seen by GI and had EGD/ colonoscopy on 6/07.  EGD was negative, colonoscopy with rectal ulcer s/p clip.  Hgb stabilized in the 7s and now is in the 8-9 range. Transfuse prn.    ESRD - usual HD MWF. Next HD Monday 6/17  Volume  -  was 7kg under dry wt w/ hypotension, improved w/ IVF"s and not pulling fluid w/ HD. Pt remains euvolemic and wt's are not increasing, will cont to keep even w/ HD.   Anemia esrd - +ABLA also as above.  S/p blood transfusions x 3.  Last OP esa was given 5/27. Getting darbe 200 mcg sq here on Mondays.   Metabolic bone disease -  CCa and phos are in range. Continues his home meds  here w/ rocaltrol and sensipar.   A fib/flutter - eliquis on hold at this time as risks outweigh benefits.    Acute cholecystitis s/p percutaneous cholecystostomy tube - from OSH 4/2- 4/23 and felt to be too high risk for surgical intervention.  Drain exchanged  12/01/22 and pt is to f/u with surgery at [facility] in July.  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  Currently DNR.   Tomasa Blase PA-C [facility] 12/28/2022,11:26 AM

## 2022-12-28 NOTE — Progress Notes (Signed)
   12/28/22 1438  Spiritual Encounters  Type of Visit Follow up  Care provided to: Pt and family  Conversation partners present during encounter Nurse  Referral source Patient request  Reason for visit Advance directives  OnCall Visit No   Pt requested Chap to finish ACD notarization. Mirna Mires brought Conservation officer, nature and witnesses. Form was signed. Originals and copies were distributed and filed accordingly. Mirna Mires remains available.

## 2022-12-28 NOTE — Progress Notes (Signed)
Provolon boots on bitaterally

## 2022-12-28 NOTE — TOC Progression Note (Signed)
Transition of Care [facility]) - Initial/Assessment Note    Patient Details  Name: Justin [name] MRN: [id number] Date of Birth: 1945/07/23  Transition of Care [facility]) CM/SW Contact:    Ralene Bathe, LCSW Phone Number: 12/28/2022, 10:16 AM  Clinical Narrative:                 LCSW received consult for SNF placement and met with the patient at bedside to confirm that the discharge plan remains the same.  The patient continues to want to go home with home health services and states that his niece, [name], will be there 24/7 to assist.   [name] has been in agreement with this plan the patient's entire hospitalization.  Care team notified.  TOC will continue to follow.   Expected Discharge Plan: Home w Home Health Services Barriers to Discharge: Continued Medical Work up   Patient Goals and CMS Choice Patient states their goals for this hospitalization and ongoing recovery are:: To return home          Expected Discharge Plan and Services In-house Referral: Clinical Social Work     Living arrangements for the past 2 months: Skilled Nursing Facility, Single Family Home                                      Prior Living Arrangements/Services Living arrangements for the past 2 months: Skilled Nursing Facility, Single Family Home Lives with:: Relatives Patient language and need for interpreter reviewed:: Yes Do you feel safe going back to the place where you live?: Yes      Need for Family Participation in Patient Care: Yes (Comment) Care giver support system in place?: Yes (comment)   Criminal Activity/Legal Involvement Pertinent to Current Situation/Hospitalization: No - Comment as needed  Activities of Daily Living      Permission Sought/Granted   Permission granted to share information with : Yes, Verbal Permission Granted     Permission granted to share info w AGENCY: home health agencies, primary care providers        Emotional Assessment Appearance::  Appears stated age Attitude/Demeanor/Rapport: Engaged Affect (typically observed): Adaptable Orientation: : Oriented to Self, Oriented to Place, Oriented to  Time, Oriented to Situation Alcohol / Substance Use: Not Applicable Psych Involvement: No (comment)  Admission diagnosis:  End stage renal disease (HCC) [N18.6] Hemorrhagic shock (HCC) [R57.8] Gastrointestinal hemorrhage, unspecified gastrointestinal hemorrhage type [K92.2] Patient Active Problem List   Diagnosis Date Noted   Shock (HCC) 12/22/2022   Rectal ulcer 12/17/2022   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   Hemorrhagic shock (HCC) 12/14/2022   Acute encephalopathy 11/09/2022   Syncope 11/09/2022   Acute blood loss anemia 11/09/2022   Melena 11/09/2022   History of colonic polyps 11/09/2022   End stage renal disease (HCC) 11/09/2022   Gastric nodule 11/09/2022   Rectal bleeding 09/17/2022   Colon ulcer 09/17/2022   Lower GI bleeding 09/17/2022   GI bleed 09/16/2022   Prolonged QT interval 09/16/2022   History of prostate cancer 09/16/2022   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   History of colon polyps 09/16/2022   Lower GI bleed 09/12/2022   Acute GI bleeding 09/10/2022   Wide-complex tachycardia 06/17/2022   Hypotension 06/13/2022   Obesity (BMI 30-39.9) 06/13/2022   Perirectal abscess 06/13/2022   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   Murmur, cardiac 01/27/2022   Chronic back pain 01/27/2022   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) 01/27/2022   GERD (gastroesophageal reflux disease) 11/11/2021   High risk medication use 11/11/2021   History of cardioversion 11/11/2021   Paroxysmal atrial fibrillation with RVR (HCC) 11/11/2021   PVC (premature ventricular contraction) 11/11/2021   Nonrheumatic aortic valve stenosis 11/06/2021   Mixed hyperlipidemia 11/06/2021   Abdominal mass 05/05/2021   First degree AV block 07/13/2020   Paroxysmal A-fib (HCC) 01/29/2020   Wound infection 01/28/2020   H/O  vitrectomy 12/07/2019   Macular pucker, 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 determined by examination associated with type 2 diabetes mellitus (HCC) 10/24/2019   Stable treated proliferative diabetic retinopathy of left eye determined by examination associated with type 2 diabetes mellitus (HCC) 10/24/2019   ESRD on hemodialysis (HCC) 07/29/2017   Anticoagulated 07/13/2017   Malignant neoplasm of prostate (HCC) 07/12/2017   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   Coagulation defect, unspecified (HCC) 01/08/2017   Idiopathic gout 01/08/2017   Acute on chronic blood loss anemia 11/20/2015   Blurred vision, left eye 07/10/2011   Essential hypertension 07/10/2011   DM (diabetes mellitus) (HCC) 07/10/2011   PCP:  [name], DO Pharmacy:   [facility] - [place], [place] - [address] [place] [address] Phone: [phone] Fax: [phone]     Social Determinants of Health (SDOH) Social History: SDOH Screenings   Food Insecurity: No Food Insecurity (11/24/2022)  Housing: Low Risk  (11/24/2022)  Transportation Needs: No Transportation Needs (11/24/2022)  Utilities: Not At Risk (11/24/2022)  Tobacco Use: Medium Risk (12/20/2022)   SDOH Interventions:     Readmission Risk Interventions    11/14/2022   10:08 AM 09/18/2022    4:10 PM 06/17/2022    2:47 PM  Readmission Risk Prevention Plan  Transportation Screening Complete Complete Complete  HRI or Home Care Consult   Complete  Social Work Consult for Recovery Care Planning/Counseling   Complete  Palliative Care Screening   Not Applicable  Medication Review Oceanographer) Complete Complete Complete  PCP or Specialist appointment within 3-5 days of discharge Complete Complete   HRI or Home Care Consult Complete Complete   SW Recovery Care/Counseling Consult Complete    Palliative Care Screening Not Applicable  Complete   Skilled Nursing Facility Complete Not Applicable

## 2022-12-28 NOTE — Plan of Care (Signed)
  Problem: Clinical Measurements: Goal: Respiratory complications will improve Outcome: Progressing Goal: Cardiovascular complication will be avoided Outcome: Progressing   Problem: Nutrition: Goal: Adequate nutrition will be maintained Outcome: Progressing   

## 2022-12-28 NOTE — Plan of Care (Signed)
  Problem: Education: Goal: Knowledge of General Education information will improve Description: Including pain rating scale, medication(s)/side effects and non-pharmacologic comfort measures Outcome: Completed/Met   

## 2022-12-28 NOTE — Progress Notes (Signed)
PROGRESS NOTE    Justin [name]  ZOX:[id number] DOB: 1946-03-24 DOA: 12/13/2022 PCP: [name], DO  Chief Complaint  Patient presents with   Rectal Bleeding    Brief Narrative:   Justin [name] is Justin [name] 77 y.o. male with Justin [name] history of atrial fibrillation on Eliquis, rectal AVM with recurrent GI bleeding, ESRD on hemodialysis, anemia, chronic hypotension, moderate aortic stenosis. Patient presented secondary to rectal bleeding with associated worsening hypotension. Patient required initial transfusion of 3 units of PRBC in addition to [facility] and was started on vasopressor support to manage hypotension from hemorrhagic shock. Patient admitted to ICU on admission. While in ICU, patient was evaluated by gastroenterology. 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 Vasopressors weaned off and patient transferred to the hospital service on 6/10.   Assessment & Plan:   Principal Problem:   Hemorrhagic shock (Justin [name]) Active Problems:   End stage renal disease (Justin [name])   Rectal ulcer   Bleeding gastric varices   Shock (Justin [name])  Acute on Chronic Hypotension Transferred to ICU 6/11 PM due to hypotension in 60's systolic.   Continue midodrine Unclear cause of acute hypotension -> currently resolved, trend  May need to consult GI +/- CTA if concern for GI bleed recurrence (no bleeding noted since)   Acute Metabolic Encephalopathy Noted when he was hypotensive  Due to hypotension? Seems improved Delirium precautions  Acute lower GI bleed Present on admission.  Likely related to Eliquis use.   [facility] was consulted and performed upper endoscopy and colonoscopy.  Endoscopy with subepitheial nodule/gastric varices without bleeding, normal examined duodenum.   Solitary ulcer in rectum, single erosion in rectum.    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  GI recommended discussion with PCP regarding need to continue eliquis long term (recommended holding 5 days from 6/7 if recommended to continue).  At this time, no plans for resumption based on critical care and cards recs.    Hemorrhagic shock Secondary to GI bleed and complicated by Eliquis use and underlying baseline hypotension. Patient required ICU admission 6/3 for vasopressor support. Patient managed on Levophed which was successfully weaned off on 6/7 with management of GI bleeding. Hypotensive again 6/11, unclear cause, no signs of recurrent bleeding.    Acute on chronic anemia S/p 3 units pRBC Hb appears stable   Atrial flutter/atrial fibrillation, unspecified Chronic issue. Patient is not on antiarrhythmia or rate control medication. He is managed on Eliquis as an outpatient which is now discontinued secondary to high risk of bleed with significant GI bleeding. 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  Needs outpatient EUS and biopsies.     Aortic stenosis Moderate stenosis noted on most recent Transthoracic Echocardiogram from 5/14.  Poorly visualized echodensity on echo? Will defer additional w/u to cards.   ESRD on HD Nephrology consulted. Patient is receiving hemodialysis while inpatient.   History of acute cholecystitis Recent. S/p percutaneous cholecystostomy tube placed in April of 2024 and still present. Will need outpatient follow up with surgery at [facility] in July   Diabetes mellitus type 2 Controlled. Hemoglobin  A1C of 5.1%.    Diabetic neuropathy -Continue Lyrica 75 mg daily   GERD -Continue Protonix   Pressure injury Pressure Injury 11/09/22 Buttocks  Left;Right;Lower Stage 2 -  Partial thickness loss of dermis presenting as Justin [name] shallow open injury with Justin [name] red, pink wound bed without slough. 1x2 pale colored injury (Active)  11/09/22 0525  Location: Buttocks  Location Orientation: Left;Right;Lower  Staging: Stage 2 -  Partial thickness loss of dermis presenting as Justin [name] shallow open injury with Justin [name] red, pink wound bed without slough.  Wound Description (Comments): 1x2 pale colored injury  Present on Admission: Yes     Pressure Injury 12/14/22 Heel Left;Posterior Unstageable - Full thickness tissue loss in which the base of the injury is covered by slough (yellow, tan, gray, green or brown) and/or eschar (tan, brown or black) in the wound bed. (Active)  12/14/22 0700  Location: Heel  Location Orientation: Left;Posterior  Staging: Unstageable - Full thickness tissue loss in which the base of the injury is covered by slough (yellow, tan, gray, green or brown) and/or eschar (tan, brown or black) in the wound bed.  Wound Description (Comments):   Present on Admission: Yes     Pressure Injury 12/14/22 Heel Right Unstageable - Full thickness tissue loss in which the base of the injury is covered by slough (yellow, tan, gray, green or brown) and/or eschar (tan, brown or black) in the wound bed. (Active)  12/14/22 0700  Location: Heel  Location Orientation: Right  Staging: Unstageable - Full thickness tissue loss in which the base of the injury is covered by slough (yellow, tan, gray, green or brown) and/or eschar (tan, brown or black) in the wound bed.  Wound Description (Comments):   Present on Admission: Yes     Pressure Injury 12/24/22 Thigh Anterior;Distal;Right Stage 2 -  Partial thickness loss of dermis presenting as Justin [name] shallow open injury with Justin [name] red, pink wound bed without slough. Healing (Active)  12/24/22 2000  Location: Thigh  Location Orientation: Anterior;Distal;Right  Staging: Stage 2 -  Partial thickness loss of dermis presenting as Justin [name]  shallow open injury with Justin [name] red, pink wound bed without slough.  Wound Description (Comments): Healing  Present on Admission: Yes      DVT prophylaxis: SCD Code Status: DNR/I Family Communication: none Disposition:   Status is: Inpatient Remains inpatient appropriate because: pending improvement   Consultants:  GI PCCM Renal  cardiology  Procedures:  Upper endoscopy Colonoscopy   Antimicrobials:  Anti-infectives (From admission, onward)    None       Subjective: No complaints  Objective: Vitals:   12/28/22 1600 12/28/22 1630 12/28/22 1700 12/28/22 1730  BP: (!) 88/71 91/70 99/82  104/79  Pulse: (!) 115 (!) 116 (!) 115 (!) 114  Resp: 18 17 19 17   Temp:      TempSrc:      SpO2: 100% 100% 100% 100%  Weight:      Height:        Intake/Output Summary (Last 24 hours) at 12/28/2022 1733 Last data filed at 12/28/2022 1259 Gross per 24 hour  Intake 360 ml  Output 150 ml  Net 210 ml   Filed Weights   12/25/22 1900 12/28/22 0533 12/28/22 1516  Weight: 87.9 kg 86.5 kg 87.5 kg    Examination:  General: No acute distress. Cardiovascular: RRR Lungs: unlabored Abdomen: Soft, nontender, nondistended  Neurological: Alert and oriented 3. Moves all extremities 4 with equal strength. Cranial nerves II through XII grossly intact. Extremities: No  clubbing or cyanosis. No edema.   Data Reviewed: I have personally reviewed following labs and imaging studies  CBC: Recent Labs  Lab 12/22/22 1814 12/22/22 2315 12/23/22 0549 12/23/22 1657 12/24/22 0759 12/25/22 0212 12/28/22 0450  WBC 7.5  --  7.8  --  7.1 7.2 6.5  NEUTROABS  --   --   --   --  4.3 4.3 3.8  HGB 9.0*   < > 8.8* 8.9* 8.7* 8.9* 9.2*  HCT 28.3*   < > 27.9* 27.9* 28.8* 28.2* 29.5*  MCV 97.3  --  94.3  --  96.3 97.9 94.9  PLT 184  --  179  --  191 197 221   < > = values in this interval not displayed.    Basic Metabolic Panel: Recent Labs  Lab 12/23/22 0549 12/24/22 0759 12/25/22 0212  12/27/22 0421 12/28/22 0450  NA 136 133* 136 136 136  K 3.9 3.6 3.5 3.8 3.8  CL 99 97* 98 97* 97*  CO2 26 30 29 29 27   GLUCOSE 82 101* 109* 93 78  BUN 11 6* 9 8 15   CREATININE 5.75* 3.92* 5.15* 5.38* 6.72*  CALCIUM 8.1* 8.1* 8.2* 8.0* 7.9*  MG  --  1.5* 1.7 1.5* 1.6*  PHOS  --  2.6 2.9 3.4 3.6    GFR: Estimated Creatinine Clearance: 9.4 mL/min (Justin [name]) (by C-G formula based on SCr of 6.72 mg/dL (H)).  Liver Function Tests: Recent Labs  Lab 12/24/22 0759 12/25/22 0212 12/27/22 0421 12/28/22 0450  AST 22 16 16 17   ALT 12 11 10 11   ALKPHOS 42 46 44 50  BILITOT 0.7 0.7 0.8 1.0  PROT 5.8* 5.8* 6.2* 5.9*  ALBUMIN 2.6* 2.6* 2.8* 2.6*    CBG: Recent Labs  Lab 12/22/22 2315 12/23/22 0319 12/23/22 0746 12/23/22 0807 12/23/22 1115  GLUCAP 85 81 65* 77 91     No results found for this or any previous visit (from the past 240 hour(s)).        Radiology Studies: No results found.      Scheduled Meds:  sodium chloride   Intravenous Once   calcitRIOL  0.75 mcg Oral Q M,W,F   Chlorhexidine Gluconate Cloth  6 each Topical Daily   Chlorhexidine Gluconate Cloth  6 each Topical Q0600   Chlorhexidine Gluconate Cloth  6 each Topical Q0600   Chlorhexidine Gluconate Cloth  6 each Topical Q0600   cinacalcet  30 mg Oral Q breakfast   darbepoetin (ARANESP) injection - DIALYSIS  200 mcg Subcutaneous Q Mon-1800   metoprolol tartrate  12.5 mg Oral BID   midodrine  20 mg Oral Q8H   pantoprazole  40 mg Oral BID   pregabalin  75 mg Oral Daily   Continuous Infusions:  sodium chloride Stopped (12/24/22 0322)   magnesium sulfate bolus IVPB       LOS: 14 days    Time spent: over 30 min    Lacretia Nicks, MD [facility]   To contact the attending provider between 7A-7P or the covering provider during after hours 7P-7A, please log into the web site [email or web address] and access using universal [place] password for that web site. If you do not have the password,  please call the hospital operator.  12/28/2022, 5:33 PM

## 2022-12-28 NOTE — Progress Notes (Signed)
   12/28/22 1058  Spiritual Encounters  Type of Visit Initial  Care provided to: Patient  Referral source Nurse (RN/NT/LPN)  Reason for visit Advance directives  OnCall Visit No  Advance Directives (For Healthcare)  Does Patient Have a Medical Advance Directive? No   Chap provided education about ACD to Pt, whom would ask his niece to sign the form. Pt will call Mirna Mires when the form is signed and ready, to notarize the form. Mirna Mires remains available to provide notarization.

## 2022-12-29 DIAGNOSIS — R578 Other shock: Secondary | ICD-10-CM | POA: Diagnosis not present

## 2022-12-29 MED ORDER — CHLORHEXIDINE GLUCONATE CLOTH 2 % EX PADS: 6.0000 | MEDICATED_PAD | Freq: Every day | CUTANEOUS | Status: DC

## 2022-12-29 MED ORDER — AMIODARONE HCL 200 MG PO TABS: 200.0000 mg | ORAL_TABLET | Freq: Every day | ORAL | Status: DC

## 2022-12-29 MED ORDER — AMIODARONE HCL 200 MG PO TABS
200.0000 mg | ORAL_TABLET | Freq: Two times a day (BID) | ORAL | Status: DC
  Administered 2022-12-29 – 2023-01-02 (×7): 200 mg via ORAL
  Filled 2022-12-29 (×7): qty 1

## 2022-12-29 NOTE — Progress Notes (Addendum)
PROGRESS NOTE    Justin [name]  UJW:[id number] DOB: Nov 27, 1945 DOA: 12/13/2022 PCP: [name], DO  Chief Complaint  Patient presents with   Rectal Bleeding    Brief Narrative:   Justin [name] is Justin [name] 77 y.o. male with Justin [name] history of atrial fibrillation on Eliquis, rectal AVM with recurrent GI bleeding, ESRD on hemodialysis, anemia, chronic hypotension, moderate aortic stenosis. Patient presented secondary to rectal bleeding with associated worsening hypotension. Patient required initial transfusion of 3 units of PRBC in addition to [facility] and was started on vasopressor support to manage hypotension from hemorrhagic shock. Patient admitted to ICU on admission. While in ICU, patient was evaluated by gastroenterology. 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 Vasopressors weaned off and patient transferred to the hospital service on 6/10.   Approaching readiness for discharge.  Asked cards to reevaluate from RVR standpoint.  Social work working on placement.  Significant Events 3/07 Colonoscopy >> cecal polyp not removed, solitary ulcer in proximal transverse colon from prior polypectomy 3 clips placed, solitary ulcer mid transverse colon 2 clips placed, few polyps in transverse colon, diverticulosis in transverse colon and Lt colon, blood in rectum and recto-sigmoid colon, one active bleeding angiodysplastic lesion in distal rectum with few AVMs treated with argon plasma coagulation 4/02 to 4/23 Admit to [facility] for acute cholecystitis with septic shock and NSTEMI 4/28 to 5/14 Admit for lower GI bleed, PNA 6/02 to ED from [facility] with rectal bleeding. 6/03 progressive hem shock. 3 prbc, kcentra, CVC placed and on pressors. PCCM to admit; CT angio GI bleed negative for active bleeding, iHD overnight 6/4  intermittently on pressors, stools dark> to hematochezia, GI consult 6/6-colonoscopy and endoscopy-results noted Transferred to hospitalist service 6/10 6/11 PCCM reconsulted for hypotension 6/12 Patient received IV fluids, not needing pressors, transfer out of ICU 6/18 cards reconsulted for persistent RVR in setting of chronic hypotension  Assessment & Plan:   Principal Problem:   Hemorrhagic shock (HCC) Active Problems:   End stage renal disease (HCC)   Rectal ulcer   Bleeding gastric varices   Shock (HCC)  Acute on Chronic Hypotension Transferred to ICU 6/11 PM due to hypotension in 60's systolic.   Continue midodrine Unclear cause of acute hypotension -> currently resolved, trend   Acute Metabolic Encephalopathy Noted when he was hypotensive  resolved Delirium precautions  Acute lower GI bleed Present on admission.  Likely related to Eliquis use.   Justin [name] gastroenterology was consulted and performed upper endoscopy and colonoscopy.  Endoscopy with subepitheial nodule/gastric varices without bleeding, normal examined duodenum.   Solitary ulcer in rectum, single erosion in rectum.   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   At this time, no plans for resumption based on critical care and cards recs (given high risk major bleed).    Hemorrhagic shock Secondary to GI bleed and complicated by Eliquis use and underlying baseline hypotension. Patient required ICU admission 6/3 for vasopressor support. Patient managed on Levophed which was successfully weaned off on 6/7 with management of GI bleeding. Hypotensive again 6/11, unclear cause, no signs of recurrent bleeding.  Resolved   Acute on chronic anemia S/p 3 units pRBC Hb appears stable   Atrial flutter/atrial fibrillation, unspecified  Chronic issue. Patient is not on antiarrhythmia or rate control medication.  He is managed on Eliquis as an outpatient which is now discontinued secondary to high risk of bleed with significant GI bleeding. -Discontinue Eliquis on discharge -rates in 100's-110's, on low dose metop.  Given continued RVR not at goal with hypotension will ask for additional cards input (message sent to cardmaster) -> discussed with Dr. Anne Fu, he recommends amiodarone 200 mg BID x14 days followed by 200 mg daily.    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  Needs outpatient EUS and biopsies.     Aortic stenosis Moderate stenosis noted on most recent Transthoracic Echocardiogram from 5/14.  Poorly visualized echodensity on echo? Will defer additional w/u to cards.   ESRD on HD Nephrology consulted. Patient is receiving hemodialysis while inpatient.   History of acute cholecystitis Recent. S/p percutaneous cholecystostomy tube placed in April of 2024 and still present. Will need outpatient follow up with surgery at [facility] in July   Diabetes mellitus type 2 Controlled. Hemoglobin A1C of 5.1%.    Diabetic neuropathy -Continue Lyrica 75 mg daily   GERD -Continue Protonix   Pressure injury Pressure Injury 11/09/22 Buttocks Left;Right;Lower Stage 2 -  Partial thickness loss of dermis presenting as Justin [name] shallow open injury with Narek Kniss red, pink wound bed without slough. 1x2 pale colored injury (Active)  11/09/22 0525  Location: Buttocks  Location Orientation: Left;Right;Lower  Staging: Stage 2 -  Partial thickness loss of dermis presenting as Justin [name] shallow open injury with Justin [name] red, pink wound bed without slough.  Wound Description (Comments): 1x2 pale colored injury  Present on Admission: Yes     Pressure Injury 12/14/22 Heel Left;Posterior Unstageable - Full thickness tissue loss in which the base of the injury is covered by slough (yellow, tan, gray, green or brown) and/or eschar (tan, brown or black) in the wound bed. (Active)  12/14/22 0700  Location: Heel   Location Orientation: Left;Posterior  Staging: Unstageable - Full thickness tissue loss in which the base of the injury is covered by slough (yellow, tan, gray, green or brown) and/or eschar (tan, brown or black) in the wound bed.  Wound Description (Comments):   Present on Admission: Yes     Pressure Injury 12/14/22 Heel Right Unstageable - Full thickness tissue loss in which the base of the injury is covered by slough (yellow, tan, gray, green or brown) and/or eschar (tan, brown or black) in the wound bed. (Active)  12/14/22 0700  Location: Heel  Location Orientation: Right  Staging: Unstageable - Full thickness tissue loss in which the base of the injury is covered by slough (yellow, tan, gray, green or brown) and/or eschar (tan, brown or black) in the wound bed.  Wound Description (Comments):   Present on Admission: Yes     Pressure Injury 12/24/22 Thigh Anterior;Distal;Right Stage 2 -  Partial thickness loss of dermis presenting as Shonn Farruggia shallow open injury with Mikell Kazlauskas red, pink wound bed without slough. Healing (Active)  12/24/22 2000  Location: Thigh  Location Orientation: Anterior;Distal;Right  Staging: Stage 2 -  Partial thickness loss of dermis presenting as Aubry Tucholski shallow open injury with Laken Rog red, pink wound bed without slough.  Wound Description (Comments): Healing  Present on Admission: Yes      DVT prophylaxis: SCD Code Status: DNR/I Family Communication: none Disposition:   Status is: Inpatient Remains inpatient appropriate because: pending improvement   Consultants:  GI PCCM Renal  cardiology  Procedures:  Upper  endoscopy Colonoscopy   Antimicrobials:  Anti-infectives (From admission, onward)    None       Subjective: No new complaints  Objective: Vitals:   12/29/22 0547 12/29/22 0719 12/29/22 0905 12/29/22 1608  BP: 100/64  (!) 96/54 97/62  Pulse: (!) 115     Resp: 18  18 18   Temp: 98.1 F (36.7 C)  98.4 F (36.9 C) 98 F (36.7 C)  TempSrc: Oral  Oral    SpO2: 100%  100% 100%  Weight:  88 kg    Height:        Intake/Output Summary (Last 24 hours) at 12/29/2022 1819 Last data filed at 12/29/2022 1700 Gross per 24 hour  Intake 926.22 ml  Output 100 ml  Net 826.22 ml   Filed Weights   12/28/22 1516 12/28/22 2008 12/29/22 0719  Weight: 87.5 kg 87.5 kg 88 kg    Examination:  General: No acute distress. Cardiovascular: irregularly irregular, tachy Lungs: unlabored Abdomen: Soft, nontender, nondistended Neurological: Alert and oriented 3. Moves all extremities 4 with equal strength. Cranial nerves II through XII grossly intact. Extremities: No clubbing or cyanosis. No edema.   Data Reviewed: I have personally reviewed following labs and imaging studies  CBC: Recent Labs  Lab 12/23/22 0549 12/23/22 1657 12/24/22 0759 12/25/22 0212 12/28/22 0450  WBC 7.8  --  7.1 7.2 6.5  NEUTROABS  --   --  4.3 4.3 3.8  HGB 8.8* 8.9* 8.7* 8.9* 9.2*  HCT 27.9* 27.9* 28.8* 28.2* 29.5*  MCV 94.3  --  96.3 97.9 94.9  PLT 179  --  191 197 221    Basic Metabolic Panel: Recent Labs  Lab 12/23/22 0549 12/24/22 0759 12/25/22 0212 12/27/22 0421 12/28/22 0450  NA 136 133* 136 136 136  K 3.9 3.6 3.5 3.8 3.8  CL 99 97* 98 97* 97*  CO2 26 30 29 29 27   GLUCOSE 82 101* 109* 93 78  BUN 11 6* 9 8 15   CREATININE 5.75* 3.92* 5.15* 5.38* 6.72*  CALCIUM 8.1* 8.1* 8.2* 8.0* 7.9*  MG  --  1.5* 1.7 1.5* 1.6*  PHOS  --  2.6 2.9 3.4 3.6    GFR: Estimated Creatinine Clearance: 10.1 mL/min (Emett Stapel) (by C-G formula based on SCr of 6.72 mg/dL (H)).  Liver Function Tests: Recent Labs  Lab 12/24/22 0759 12/25/22 0212 12/27/22 0421 12/28/22 0450  AST 22 16 16 17   ALT 12 11 10 11   ALKPHOS 42 46 44 50  BILITOT 0.7 0.7 0.8 1.0  PROT 5.8* 5.8* 6.2* 5.9*  ALBUMIN 2.6* 2.6* 2.8* 2.6*    CBG: Recent Labs  Lab 12/22/22 2315 12/23/22 0319 12/23/22 0746 12/23/22 0807 12/23/22 1115  GLUCAP 85 81 65* 77 91     No results found for this or any  previous visit (from the past 240 hour(s)).        Radiology Studies: No results found.      Scheduled Meds:  sodium chloride   Intravenous Once   calcitRIOL  0.75 mcg Oral Q M,W,F   Chlorhexidine Gluconate Cloth  6 each Topical Q0600   cinacalcet  30 mg Oral Q breakfast   darbepoetin (ARANESP) injection - DIALYSIS  200 mcg Subcutaneous Q Mon-1800   metoprolol tartrate  12.5 mg Oral BID   midodrine  20 mg Oral Q8H   pantoprazole  40 mg Oral BID   pregabalin  75 mg Oral Daily   Continuous Infusions:  sodium chloride Stopped (12/24/22 0322)  LOS: 15 days    Time spent: over 30 min    Lacretia Nicks, MD [facility]   To contact the attending provider between 7A-7P or the covering provider during after hours 7P-7A, please log into the web site [email or web address] and access using universal [place] password for that web site. If you do not have the password, please call the hospital operator.  12/29/2022, 6:19 PM

## 2022-12-29 NOTE — Progress Notes (Signed)
[facility] Progress Note   Subjective: Seen in room. No new events. Completed dialysis yesterday -no issues.  Objective Vitals:   12/28/22 2053 12/29/22 0547 12/29/22 0719 12/29/22 0905  BP: 99/63 100/64  (!) 96/54  Pulse: (!) 117 (!) 115    Resp: 18 18  18   Temp: (!) 97.5 F (36.4 C) 98.1 F (36.7 C)  98.4 F (36.9 C)  TempSrc: Oral Oral  Oral  SpO2: 100% 100%  100%  Weight:   88 kg   Height:         Additional Objective Labs: Basic Metabolic Panel: Recent Labs  Lab 12/25/22 0212 12/27/22 0421 12/28/22 0450  NA 136 136 136  K 3.5 3.8 3.8  CL 98 97* 97*  CO2 29 29 27   GLUCOSE 109* 93 78  BUN 9 8 15   CREATININE 5.15* 5.38* 6.72*  CALCIUM 8.2* 8.0* 7.9*  PHOS 2.9 3.4 3.6    CBC: Recent Labs  Lab 12/22/22 1814 12/22/22 2315 12/23/22 0549 12/23/22 1657 12/24/22 0759 12/25/22 0212 12/28/22 0450  WBC 7.5  --  7.8  --  7.1 7.2 6.5  NEUTROABS  --   --   --   --  4.3 4.3 3.8  HGB 9.0*   < > 8.8*   < > 8.7* 8.9* 9.2*  HCT 28.3*   < > 27.9*   < > 28.8* 28.2* 29.5*  MCV 97.3  --  94.3  --  96.3 97.9 94.9  PLT 184  --  179  --  191 197 221   < > = values in this interval not displayed.    Blood Culture    Component Value Date/Time   SDES ABSCESS 06/15/2022 1344   SPECREQUEST PERIRECTAL 06/15/2022 1344   CULT  06/15/2022 1344    FEW ESCHERICHIA COLI NO ANAEROBES ISOLATED Performed at [facility], [address], [place], [place] [address]    REPTSTATUS 06/20/2022 FINAL 06/15/2022 1344     Physical Exam General: Alert, sitting up in bed, nad Heart: Tachy, SEM Lungs: Clear bilaterally  Abdomen: soft non-tender Extremities: no LE edema Dialysis Access: LUE AVF +bruit   Medications:  sodium chloride Stopped (12/24/22 0322)    sodium chloride   Intravenous Once   calcitRIOL  0.75 mcg Oral Q M,W,F   cinacalcet  30 mg Oral Q breakfast   darbepoetin (ARANESP) injection - DIALYSIS  200 mcg Subcutaneous Q Mon-1800   metoprolol  tartrate  12.5 mg Oral BID   midodrine  20 mg Oral Q8H   pantoprazole  40 mg Oral BID   pregabalin  75 mg Oral Daily     OP HD: [facility] 3h  450/600  91.6kg  2/2.5 bath  LUA AVF  Hep none - coming in under dry wt around 89kg last 2 sessions - rocaltrol 0.75 mcg po tiw - venofer 50 iv weekly - mircera 200 mcg IV q 2 wks, last 5/27, due 6/10 - last Hb 9.6 on 5/29   Assessment/Plan: Acute on chronic hypotension- in shock initially related to acute GIB and rx'd in ICU from 6/02- 6/10. Pt moved to floor. On 6/11 BP's had dropped again and pt moved back to ICU briefly. Unclear cause, BP and pt improved and was again moved out of ICU. Cont po midodrine 20 tid.  GI bleed - S/P 3 units PRBC's. CT angio was negative for active bleed. Eliquis was held. Had recurrent rectal bleeding/ hematochezia on 12/15/22, seen by GI and  had EGD/ colonoscopy on 6/07.  EGD was negative, colonoscopy with rectal ulcer s/p clip.  Hgb stabilized in the 7s and now is in the 8-9 range. Transfuse prn.    ESRD - usual HD MWF. Next HD Monday 6/17  Volume  -  was 7kg under dry wt w/ hypotension, improved w/ IVF"s and not pulling fluid w/ HD. Pt remains euvolemic and wt's are not increasing, will cont to keep even w/ HD.   Anemia esrd - +ABLA also as above.  S/p blood transfusions x 3.  Last OP esa was given 5/27. Getting darbe 200 mcg sq here on Mondays.   Metabolic bone disease -  CCa and phos are in range. Continues his home meds  here w/ rocaltrol and sensipar.   A fib/flutter - eliquis on hold at this time as risks outweigh benefits.    Acute cholecystitis s/p percutaneous cholecystostomy tube - from OSH 4/2- 4/23 and felt to be too high risk for surgical intervention.  Drain exchanged 12/01/22 and pt is to f/u with surgery at [facility] in July.  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  Currently DNR.  Dispo - appears for SNF placement    Logyn Dedominicis Susann Givens PA-C [facility] 12/29/2022,10:29 AM

## 2022-12-29 NOTE — Progress Notes (Signed)
Physical Therapy Treatment Patient Details Name: Justin [name] MRN: [id number] DOB: 1946-06-29 Today's Date: 12/29/2022   History of Present Illness Patient is a 77 y/o male admitted 12/14/22 with recurrent GIB. went to stepdown unit, then back to ICU on 6/11 for sustained hypotension. PMH includes a-fib on Eliquis, rectal AVM, ESRD on HD, chronic hypotension, mod AS, Prostate CA, DM, retinopathy, peripheral neuropathy, gout, HTN, admission 4/2-4/23 for cholecystitis, septic shock, NSTEMI, placed cholecystostomy tube, admission 5/1-5/14 for lower GIB/PNA.    PT Comments    Progressed patient towards goals per plan of care. Pt able to progress bed mobility from sidelying to sit without physical assistance; still required some assistance rolling towards left. Able to tolerate multiple attempts for sit <> stand in order to reposition change sheets; required mod assist with increased attempt due to fatigue in left lower extremity. Session limited due to patient fatigue; required +2 assist to return to bed following transfer training. Discussed rehab frequency with patient at end of session. Patient will continue to benefit from skilled physical therapy in order to improve functional mobility and independence.     Recommendations for follow up therapy are one component of a multi-disciplinary discharge planning process, led by the attending physician.  Recommendations may be updated based on patient status, additional functional criteria and insurance authorization.  Follow Up Recommendations  Can patient physically be transported by private vehicle: No    Assistance Recommended at Discharge Frequent or constant Supervision/Assistance  Patient can return home with the following A lot of help with walking and/or transfers;A lot of help with bathing/dressing/bathroom;Assist for transportation;Help with stairs or ramp for entrance   Equipment Recommendations       Recommendations for Other Services        Precautions / Restrictions Precautions Precautions: Fall Precaution Comments: hypersensitivity/pain B feet, bilat darco shoes donned during session; watch BP Required Braces or Orthoses:  (pt has bil heel offloading darco shoes in room) Restrictions Weight Bearing Restrictions: No Other Position/Activity Restrictions: pt reports bil foot discomfort but with darco shoes able to tolerate some pressure of forefoot on floor     Mobility  Bed Mobility Overal bed mobility: Needs Assistance Bed Mobility: Rolling, Sit to Supine Rolling: Min guard     Sit to supine: Mod assist, +2 for physical assistance, +2 for safety/equipment   General bed mobility comments: Pt able perform transition from sidelying to EOB without physical assistance. Pt endorsed that he doesn't require help from PT once in sidelying. Min Guard provided for safety. Required +2 assistance at shoulders and legs from EOB to supine due to lethargy at the end of session.    Transfers Overall transfer level: Needs assistance Equipment used: Rolling walker (2 wheels) Transfers: Sit to/from Stand Sit to Stand: From elevated surface, Mod assist, Max assist          Lateral/Scoot Transfers: Min assist General transfer comment: Patient able to perform serial sit <> stand x4 for repositioning and sheet replacement. Pt endorsed that LLE is weaker and fatigues quick after standing. Mod A for initial sit to stand; Required max Assist with last repetition; presented with heavy forward lean. Able laterally scoot to [facility] with min assist for LLE.    Ambulation/Gait               General Gait Details: Unable to progress ambulation due to LE weakness and balance impairments.   Stairs             Psychologist, prison and probation services  Modified Rankin (Stroke Patients Only)       Balance Overall balance assessment: Needs assistance Sitting-balance support: Feet supported, Bilateral upper extremity supported Sitting  balance-Leahy Scale: Fair Sitting balance - Comments: Able sit EOB to donn gown and prep for transfer   Standing balance support: Bilateral upper extremity supported, Reliant on assistive device for balance Standing balance-Leahy Scale: Poor Standing balance comment: Initial standing able to maintain with minA and RW support; regressed to mod assist with increased repetition.                            Cognition Arousal/Alertness: Awake/alert Behavior During Therapy: WFL for tasks assessed/performed Overall Cognitive Status: Within Functional Limits for tasks assessed                                 General Comments: Pt received asleep taking a nap but willing to mobilize with therapy. Confused about therapy schdeule throughout the week; discussed frequency of therapy with patient.        Exercises General Exercises - Lower Extremity Hip Flexion/Marching: Strengthening, Right, Left, Seated, 10 reps, AROM (AAROM for left LE; self-assist using UEs)    General Comments        Pertinent Vitals/Pain Pain Assessment Pain Assessment: 0-10 Pain Score: 8  Pain Location: Bilateral feet Pain Descriptors / Indicators: Tender, Sore Pain Intervention(s): Limited activity within patient's tolerance, Monitored during session    Home Living                          Prior Function            PT Goals (current goals can now be found in the care plan section) Acute Rehab PT Goals Patient Stated Goal: To get stronger anf Justin [name] home PT Goal Formulation: With patient Time For Goal Achievement: 12/30/22 Potential to Achieve Goals: Fair Progress towards PT goals: Progressing toward goals    Frequency    Min 2X/week      PT Plan Current plan remains appropriate    Co-evaluation              AM-PAC PT "6 Clicks" Mobility   Outcome Measure  Help needed turning from your back to your side while in a flat bed without using bedrails?: A  Little Help needed moving from lying on your back to sitting on the side of a flat bed without using bedrails?: A Little Help needed moving to and from a bed to a chair (including a wheelchair)?: A Lot Help needed standing up from a chair using your arms (e.g., wheelchair or bedside chair)?: A Lot Help needed to walk in hospital room?: A Lot Help needed climbing 3-5 steps with a railing? : Total 6 Click Score: 13    End of Session Equipment Utilized During Treatment: Other (comment);Gait belt (darco shoes bil to offload his heels) Activity Tolerance: Patient tolerated treatment well;Patient limited by fatigue Patient left: in bed;with call bell/phone within reach;with bed alarm set (pt's niece) Nurse Communication: Mobility status PT Visit Diagnosis: Other abnormalities of gait and mobility (R26.89);Muscle weakness (generalized) (M62.81) Pain - Right/Left:  (bilateral) Pain - part of body: Ankle and joints of foot     Time: 5188-4166 PT Time Calculation (min) (ACUTE ONLY): 40 min  Charges:  $Gait Training: 23-37 mins $Therapeutic Activity: 8-22 mins  Christene Lye, SPT Acute Rehabilitation Services [phone] Secure chat preferred     Christene Lye 12/29/2022, 4:25 PM

## 2022-12-29 NOTE — TOC Progression Note (Addendum)
Transition of Care [facility]) - Initial/Assessment Note    Patient Details  Name: Justin [name] MRN: [id number] Date of Birth: 07/25/1945  Transition of Care [facility]) CM/SW Contact:    Ralene Bathe, LCSW Phone Number: 12/29/2022, 10:46 AM  Clinical Narrative:                 Addendum- 10:57-  LCSW spoke with Wynona Canes in admissions at [facility].  The facility can accept the patient should insurance authorization be approved. LCSW will submit for insurance authorization once updated PT note is entered.    LCSW met with patient at bedside after receiving notification that patient is requesting SNF.  LCSW spoke with patient and patient is agreeable to return to [facility].  LCSW contacted the facility to inquire as to whether they can still accept the patient.  LCSW will initiate insurance authorization once the facility confirms.    TOC following.    Expected Discharge Plan: Home w Home Health Services Barriers to Discharge: Continued Medical Work up   Patient Goals and CMS Choice Patient states their goals for this hospitalization and ongoing recovery are:: To return home          Expected Discharge Plan and Services In-house Referral: Clinical Social Work     Living arrangements for the past 2 months: Skilled Nursing Facility, Single Family Home                                      Prior Living Arrangements/Services Living arrangements for the past 2 months: Skilled Nursing Facility, Single Family Home Lives with:: Relatives Patient language and need for interpreter reviewed:: Yes Do you feel safe going back to the place where you live?: Yes      Need for Family Participation in Patient Care: Yes (Comment) Care giver support system in place?: Yes (comment)   Criminal Activity/Legal Involvement Pertinent to Current Situation/Hospitalization: No - Comment as needed  Activities of Daily Living      Permission Sought/Granted   Permission granted to share  information with : Yes, Verbal Permission Granted     Permission granted to share info w AGENCY: home health agencies, primary care providers        Emotional Assessment Appearance:: Appears stated age Attitude/Demeanor/Rapport: Engaged Affect (typically observed): Adaptable Orientation: : Oriented to Self, Oriented to Place, Oriented to  Time, Oriented to Situation Alcohol / Substance Use: Not Applicable Psych Involvement: No (comment)  Admission diagnosis:  End stage renal disease (HCC) [N18.6] Hemorrhagic shock (HCC) [R57.8] Gastrointestinal hemorrhage, unspecified gastrointestinal hemorrhage type [K92.2] Patient Active Problem List   Diagnosis Date Noted   Shock (HCC) 12/22/2022   Rectal ulcer 12/17/2022   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   Hemorrhagic shock (HCC) 12/14/2022   Acute encephalopathy 11/09/2022   Syncope 11/09/2022   Acute blood loss anemia 11/09/2022   Melena 11/09/2022   History of colonic polyps 11/09/2022   End stage renal disease (HCC) 11/09/2022   Gastric nodule 11/09/2022   Rectal bleeding 09/17/2022   Colon ulcer 09/17/2022   Lower GI bleeding 09/17/2022   GI bleed 09/16/2022   Prolonged QT interval 09/16/2022   History of prostate cancer 09/16/2022   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   History of colon polyps 09/16/2022   Lower GI bleed 09/12/2022   Acute GI bleeding 09/10/2022   Wide-complex tachycardia 06/17/2022   Hypotension 06/13/2022   Obesity (BMI 30-39.9) 06/13/2022  Perirectal abscess 06/13/2022   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   Murmur, cardiac 01/27/2022   Chronic back pain 01/27/2022   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/27/2022   GERD (gastroesophageal reflux disease) 11/11/2021   High risk medication use 11/11/2021   History of cardioversion 11/11/2021   Paroxysmal atrial fibrillation with RVR (HCC) 11/11/2021   PVC (premature ventricular contraction) 11/11/2021   Nonrheumatic aortic valve  stenosis 11/06/2021   Mixed hyperlipidemia 11/06/2021   Abdominal mass 05/05/2021   First degree AV block 07/13/2020   Paroxysmal A-fib (HCC) 01/29/2020   Wound infection 01/28/2020   H/O vitrectomy 12/07/2019   Macular pucker, 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 determined by examination associated with type 2 diabetes mellitus (HCC) 10/24/2019   Stable treated proliferative diabetic retinopathy of left eye determined by examination associated with type 2 diabetes mellitus (HCC) 10/24/2019   ESRD on hemodialysis (HCC) 07/29/2017   Anticoagulated 07/13/2017   Malignant neoplasm of prostate (HCC) 07/12/2017   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 [place] [address] Phone: [phone] Fax: [phone]     Social Determinants of Health (SDOH) Social History: SDOH Screenings   Food Insecurity: No Food Insecurity (11/24/2022)  Housing: Low Risk  (11/24/2022)  Transportation Needs: No Transportation Needs (11/24/2022)  Utilities: Not At Risk (11/24/2022)  Tobacco Use: Medium Risk (12/20/2022)   SDOH Interventions:     Readmission Risk Interventions    11/14/2022   10:08 AM 09/18/2022    4:10 PM 06/17/2022    2:47 PM  Readmission Risk Prevention Plan  Transportation Screening Complete Complete Complete  HRI or Home Care Consult   Complete  Social Work Consult for Recovery Care Planning/Counseling   Complete  Palliative Care Screening   Not Applicable  Medication Review Oceanographer) Complete Complete Complete  PCP or Specialist  appointment within 3-5 days of discharge Complete Complete   HRI or Home Care Consult Complete Complete   SW Recovery Care/Counseling Consult Complete    Palliative Care Screening Not Applicable Complete   Skilled Nursing Facility Complete Not Applicable

## 2022-12-29 NOTE — Progress Notes (Signed)
Occupational Therapy Treatment Patient Details Name: Justin [name] MRN: [id number] DOB: April 10, 1946 Today's Date: 12/29/2022   History of present illness Patient is a 77 y/o male admitted 12/14/22 with recurrent GIB. went to stepdown unit, then back to ICU on 6/11 for sustained hypotension. PMH includes a-fib on Eliquis, rectal AVM, ESRD on HD, chronic hypotension, mod AS, Prostate CA, DM, retinopathy, peripheral neuropathy, gout, HTN, admission 4/2-4/23 for cholecystitis, septic shock, NSTEMI, placed cholecystostomy tube, admission 5/1-5/14 for lower GIB/PNA.   OT comments  Patient was able to participate in sitting EOB with patient choosing which tasks he would and would not participate in. Patient declined to attempt standing with continued pain in BLE limiting patient. Patients bp was 97/55 mmhg during session with HR of 114 to 117 bpm. Patient is not at a level to transition back home alone at this time. Patient will benefit from continued inpatient follow up therapy, <3 hours/dayPatient's discharge plan remains appropriate at this time. OT will continue to follow acutely.      Recommendations for follow up therapy are one component of a multi-disciplinary discharge planning process, led by the attending physician.  Recommendations may be updated based on patient status, additional functional criteria and insurance authorization.    Assistance Recommended at Discharge Frequent or constant Supervision/Assistance  Patient can return home with the following  Two people to help with walking and/or transfers;A lot of help with bathing/dressing/bathroom;Assistance with cooking/housework;Direct supervision/assist for medications management;Direct supervision/assist for financial management;Assist for transportation;Help with stairs or ramp for entrance   Equipment Recommendations  None recommended by OT       Precautions / Restrictions Precautions Precautions: Fall Precaution Comments:  hypersensitivity/pain B feet, bilat darco shoes donned during session; watch BP Restrictions Weight Bearing Restrictions: No       Mobility Bed Mobility Overal bed mobility: Needs Assistance Bed Mobility: Rolling Rolling: Min guard   Supine to sit: Min guard, HOB elevated Sit to supine: Mod assist   General bed mobility comments: to get BLE back into bed with patient attempting to complete himself but needing physical A to get BLE back into bed.               ADL either performed or assessed with clinical judgement   ADL Overall ADL's : Needs assistance/impaired       Grooming Details (indicate cue type and reason): patient declined reporting he did this already this AM                   Toilet Transfer Details (indicate cue type and reason): patietn declined to attempt standing leaving prevlon boots on sitting EOB. patietn was educated on need for non skid material on floor with patietn upset reporting that his feet cannont touch floor and refusing to switch to darco shoes at this time. patient unable to attempt scooting safely with prevlon boots in place at this time. Toileting- Clothing Manipulation and Hygiene: Maximal assistance;Bed level Toileting - Clothing Manipulation Details (indicate cue type and reason): patient was able to roll himself in bed with max A for hygiene tasks with use of fresh washcloth therapist retrieved from linen cart and the bottle of no rinse PH cleaner present in room to complete hygiene tasks.  patient declining to attempt to use his own hand to complete task. patient reported feeling burning with turning in bed on bottom only when turning. no irritation observed with minimal movement patient would complete for therapist to observe area after report.  nurse made  aware. no meaplex in place at this time.              Cognition Arousal/Alertness: Awake/alert Behavior During Therapy: WFL for tasks assessed/performed Overall Cognitive  Status: Within Functional Limits for tasks assessed           General Comments: patient noted to be irritable with education provided on how therapy was to help and that questions were not to irritate but to try to understand where patient was in his day. patient appeared to be more cooperative after this                   Pertinent Vitals/ Pain       Pain Assessment Pain Assessment: Faces Faces Pain Scale: Hurts little more Pain Location: feet Pain Descriptors / Indicators: Tender, Sore, Burning Pain Intervention(s): Limited activity within patient's tolerance, Monitored during session, Repositioned         Frequency  Min 2X/week        Progress Toward Goals  OT Goals(current goals can now be found in the care plan section)  Progress towards OT goals: OT to reassess next treatment     Plan Discharge plan remains appropriate;Frequency remains appropriate       AM-PAC OT "6 Clicks" Daily Activity     Outcome Measure   Help from another person eating meals?: A Little Help from another person taking care of personal grooming?: A Little Help from another person toileting, which includes using toliet, bedpan, or urinal?: A Lot Help from another person bathing (including washing, rinsing, drying)?: A Lot Help from another person to put on and taking off regular upper body clothing?: A Little Help from another person to put on and taking off regular lower body clothing?: A Lot 6 Click Score: 15    End of Session    OT Visit Diagnosis: Unsteadiness on feet (R26.81);Other abnormalities of gait and mobility (R26.89);Muscle weakness (generalized) (M62.81) Pain - part of body: Ankle and joints of foot   Activity Tolerance Patient limited by pain   Patient Left in bed;with call bell/phone within reach;with bed alarm set   Nurse Communication Other (comment) (ok to participate in session, patients reports of pain in BLE and burning on bottom)        Time:  1610-9604 OT Time Calculation (min): 25 min  Charges: OT General Charges $OT Visit: 1 Visit OT Treatments $Self Care/Home Management : 23-37 mins  Rosalio Loud, MS Acute Rehabilitation Department Office# [phone]   Selinda Flavin 12/29/2022, 8:39 AM

## 2022-12-30 DIAGNOSIS — I864 Gastric varices: Secondary | ICD-10-CM | POA: Diagnosis not present

## 2022-12-30 DIAGNOSIS — K626 Ulcer of anus and rectum: Secondary | ICD-10-CM | POA: Diagnosis not present

## 2022-12-30 DIAGNOSIS — N186 End stage renal disease: Secondary | ICD-10-CM | POA: Diagnosis not present

## 2022-12-30 DIAGNOSIS — R578 Other shock: Secondary | ICD-10-CM | POA: Diagnosis not present

## 2022-12-30 LAB — CBC WITH DIFFERENTIAL/PLATELET
Abs Immature Granulocytes: 0.01 10*3/uL (ref 0.00–0.07)
Basophils Absolute: 0.1 10*3/uL (ref 0.0–0.1)
Basophils Relative: 1 %
Eosinophils Absolute: 0.2 10*3/uL (ref 0.0–0.5)
Eosinophils Relative: 2 %
HCT: 27.8 % — ABNORMAL LOW (ref 39.0–52.0)
Hemoglobin: 9.3 g/dL — ABNORMAL LOW (ref 13.0–17.0)
Immature Granulocytes: 0 %
Lymphocytes Relative: 32 %
Lymphs Abs: 2 10*3/uL (ref 0.7–4.0)
MCH: 32.1 pg (ref 26.0–34.0)
MCHC: 33.5 g/dL (ref 30.0–36.0)
MCV: 95.9 fL (ref 80.0–100.0)
Monocytes Absolute: 0.6 10*3/uL (ref 0.1–1.0)
Monocytes Relative: 10 %
Neutro Abs: 3.4 10*3/uL (ref 1.7–7.7)
Neutrophils Relative %: 55 %
Platelets: 218 10*3/uL (ref 150–400)
RBC: 2.9 MIL/uL — ABNORMAL LOW (ref 4.22–5.81)
RDW: 16.1 % — ABNORMAL HIGH (ref 11.5–15.5)
WBC: 6.3 10*3/uL (ref 4.0–10.5)
nRBC: 0 % (ref 0.0–0.2)

## 2022-12-30 LAB — RENAL FUNCTION PANEL
Albumin: 2.4 g/dL — ABNORMAL LOW (ref 3.5–5.0)
Anion gap: 9 (ref 5–15)
BUN: 15 mg/dL (ref 8–23)
CO2: 28 mmol/L (ref 22–32)
Calcium: 7.9 mg/dL — ABNORMAL LOW (ref 8.9–10.3)
Chloride: 102 mmol/L (ref 98–111)
Creatinine, Ser: 6.27 mg/dL — ABNORMAL HIGH (ref 0.61–1.24)
GFR, Estimated: 9 mL/min — ABNORMAL LOW (ref >60)
Glucose, Bld: 77 mg/dL (ref 70–99)
Phosphorus: 2.8 mg/dL (ref 2.5–4.6)
Potassium: 4.1 mmol/L (ref 3.5–5.1)
Sodium: 139 mmol/L (ref 135–145)

## 2022-12-30 NOTE — Progress Notes (Signed)
[facility] Progress Note   Subjective: Seen in room. No new complaints. Discharge planning ongoing. For dialysis today   Objective Vitals:   12/29/22 0905 12/29/22 1608 12/29/22 2011 12/30/22 0606  BP: (!) 96/54 97/62 99/63  94/71  Pulse:   (!) 55 (!) 113  Resp: 18 18 18 18   Temp: 98.4 F (36.9 C) 98 F (36.7 C) 97.8 F (36.6 C) 97.9 F (36.6 C)  TempSrc: Oral  Oral   SpO2: 100% 100% 100% 100%  Weight:      Height:         Additional Objective Labs: Basic Metabolic Panel: Recent Labs  Lab 12/25/22 0212 12/27/22 0421 12/28/22 0450  NA 136 136 136  K 3.5 3.8 3.8  CL 98 97* 97*  CO2 29 29 27   GLUCOSE 109* 93 78  BUN 9 8 15   CREATININE 5.15* 5.38* 6.72*  CALCIUM 8.2* 8.0* 7.9*  PHOS 2.9 3.4 3.6    CBC: Recent Labs  Lab 12/24/22 0759 12/25/22 0212 12/28/22 0450  WBC 7.1 7.2 6.5  NEUTROABS 4.3 4.3 3.8  HGB 8.7* 8.9* 9.2*  HCT 28.8* 28.2* 29.5*  MCV 96.3 97.9 94.9  PLT 191 197 221    Blood Culture    Component Value Date/Time   SDES ABSCESS 06/15/2022 1344   SPECREQUEST PERIRECTAL 06/15/2022 1344   CULT  06/15/2022 1344    FEW ESCHERICHIA COLI NO ANAEROBES ISOLATED Performed at [facility], [address], [place], [place] [address]    REPTSTATUS 06/20/2022 FINAL 06/15/2022 1344     Physical Exam General: Alert, sitting up in bed, nad Heart: Tachy, SEM Lungs: Clear bilaterally  Abdomen: soft non-tender Extremities: no LE edema Dialysis Access: LUE AVF +bruit   Medications:  sodium chloride Stopped (12/24/22 0322)    sodium chloride   Intravenous Once   amiodarone  200 mg Oral BID   Followed by   Melene Muller ON 01/06/2023] amiodarone  200 mg Oral Daily   calcitRIOL  0.75 mcg Oral Q M,W,F   cinacalcet  30 mg Oral Q breakfast   darbepoetin (ARANESP) injection - DIALYSIS  200 mcg Subcutaneous Q Mon-1800   midodrine  20 mg Oral Q8H   pantoprazole  40 mg Oral BID   pregabalin  75 mg Oral Daily     OP HD: [facility] M[facility] 3h   450/600  91.6kg  2/2.5 bath  LUA AVF  Hep none - coming in under dry wt around 89kg last 2 sessions - rocaltrol 0.75 mcg po tiw - venofer 50 iv weekly - mircera 200 mcg IV q 2 wks, last 5/27, due 6/10 - last Hb 9.6 on 5/29   Assessment/Plan: Acute on chronic hypotension- in shock initially related to acute GIB and rx'd in ICU from 6/02- 6/10. Pt moved to floor. On 6/11 BP's had dropped again and pt moved back to ICU briefly. Unclear cause, BP and pt improved and was again moved out of ICU. Cont po midodrine 20 tid.  GI bleed - S/P 3 units PRBC's. CT angio was negative for active bleed. Eliquis was held. Had recurrent rectal bleeding/ hematochezia on 12/15/22, seen by GI and had EGD/ colonoscopy on 6/07.  EGD was negative, colonoscopy with rectal ulcer s/p clip.  Hgb stabilized in the 7s and now is in the 8-9 range. Transfuse prn.    ESRD - usual HD MWF. Next HD Monday 6/17  Volume  -  was 7kg under dry wt w/ hypotension, improved w/ IVF"s and  not pulling fluid w/ HD. Pt remains euvolemic and wt's are not increasing, will cont to keep even w/ HD.   Anemia esrd - +ABLA also as above.  S/p blood transfusions x 3.  Last OP esa was given 5/27. Getting darbe 200 mcg sq here on Mondays.   Metabolic bone disease -  CCa and phos are in range. Continues his home meds  here w/ rocaltrol and sensipar.   A fib/flutter/RVR - eliquis on hold at this time as risks outweigh benefits. Amiodarone started here.     Acute cholecystitis s/p percutaneous cholecystostomy tube - from OSH 4/2- 4/23 and felt to be too high risk for surgical intervention.  Drain exchanged 12/01/22 and pt is to f/u with surgery at [facility] in July.  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  Currently DNR.  Dispo - appears for SNF placement   Lulabelle Desta Susann Givens PA-C [facility] 12/30/2022,9:13 AM

## 2022-12-30 NOTE — TOC Progression Note (Signed)
Transition of Care [facility]) - Initial/Assessment Note    Patient Details  Name: Justin [name] MRN: [id number] Date of Birth: [birth year]  Transition of Care [facility]) CM/SW Contact:    Ralene Bathe, LCSW Phone Number: 12/30/2022, 10:32 AM  Clinical Narrative:                 LCSW received a call from the patient's insurance company.  The request for insurance authorization will be sent for medical review.  TOC following.  Expected Discharge Plan: Home w Home Health Services Barriers to Discharge: Continued Medical Work up   Patient Goals and CMS Choice Patient states their goals for this hospitalization and ongoing recovery are:: To return home          Expected Discharge Plan and Services In-house Referral: Clinical Social Work     Living arrangements for the past 2 months: Skilled Nursing Facility, Single Family Home                                      Prior Living Arrangements/Services Living arrangements for the past 2 months: Skilled Nursing Facility, Single Family Home Lives with:: Relatives Patient language and need for interpreter reviewed:: Yes Do you feel safe going back to the place where you live?: Yes      Need for Family Participation in Patient Care: Yes (Comment) Care giver support system in place?: Yes (comment)   Criminal Activity/Legal Involvement Pertinent to Current Situation/Hospitalization: No - Comment as needed  Activities of Daily Living      Permission Sought/Granted   Permission granted to share information with : Yes, Verbal Permission Granted     Permission granted to share info w AGENCY: home health agencies, primary care providers        Emotional Assessment Appearance:: Appears stated age Attitude/Demeanor/Rapport: Engaged Affect (typically observed): Adaptable Orientation: : Oriented to Self, Oriented to Place, Oriented to  Time, Oriented to Situation Alcohol / Substance Use: Not Applicable Psych Involvement: No  (comment)  Admission diagnosis:  End stage renal disease (HCC) [N18.6] Hemorrhagic shock (HCC) [R57.8] Gastrointestinal hemorrhage, unspecified gastrointestinal hemorrhage type [K92.2] Patient Active Problem List   Diagnosis Date Noted   Shock (HCC) 12/22/2022   Rectal ulcer 12/17/2022   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   Hemorrhagic shock (HCC) 12/14/2022   Acute encephalopathy 11/09/2022   Syncope 11/09/2022   Acute blood loss anemia 11/09/2022   Melena 11/09/2022   History of colonic polyps 11/09/2022   End stage renal disease (HCC) 11/09/2022   Gastric nodule 11/09/2022   Rectal bleeding 09/17/2022   Colon ulcer 09/17/2022   Lower GI bleeding 09/17/2022   GI bleed 09/16/2022   Prolonged QT interval 09/16/2022   History of prostate cancer 09/16/2022   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   History of colon polyps 09/16/2022   Lower GI bleed 09/12/2022   Acute GI bleeding 09/10/2022   Wide-complex tachycardia 06/17/2022   Hypotension 06/13/2022   Obesity (BMI 30-39.9) 06/13/2022   Perirectal abscess 06/13/2022   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   Murmur, cardiac 01/27/2022   Chronic back pain 01/27/2022   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/27/2022   GERD (gastroesophageal reflux disease) 11/11/2021   High risk medication use 11/11/2021   History of cardioversion 11/11/2021   Paroxysmal atrial fibrillation with RVR (HCC) 11/11/2021   PVC (premature ventricular contraction) 11/11/2021   Nonrheumatic aortic valve stenosis  11/06/2021   Mixed hyperlipidemia 11/06/2021   Abdominal mass 05/05/2021   First degree AV block 07/13/2020   Paroxysmal A-fib (HCC) 01/29/2020   Wound infection 01/28/2020   H/O vitrectomy 12/07/2019   Macular pucker, 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 determined by examination associated with type 2 diabetes mellitus (HCC) 10/24/2019   Stable treated proliferative diabetic  retinopathy of left eye determined by examination associated with type 2 diabetes mellitus (HCC) 10/24/2019   ESRD on hemodialysis (HCC) 07/29/2017   Anticoagulated 07/13/2017   Malignant neoplasm of prostate (HCC) 07/12/2017   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   Coagulation defect, unspecified (HCC) 01/08/2017   Idiopathic gout 01/08/2017   Acute on chronic blood loss anemia 11/20/2015   Blurred vision, left eye 07/10/2011   Essential hypertension 07/10/2011   DM (diabetes mellitus) (HCC) 07/10/2011   PCP:  [name], DO Pharmacy:   [facility] [place] - [place], [place] - [address] [place] [address] Phone: [phone] Fax: [phone]     Social Determinants of Health (SDOH) Social History: SDOH Screenings   Food Insecurity: No Food Insecurity (11/24/2022)  Housing: Low Risk  (11/24/2022)  Transportation Needs: No Transportation Needs (11/24/2022)  Utilities: Not At Risk (11/24/2022)  Tobacco Use: Medium Risk (12/20/2022)   SDOH Interventions:     Readmission Risk Interventions    11/14/2022   10:08 AM 09/18/2022    4:10 PM 06/17/2022    2:47 PM  Readmission Risk Prevention Plan  Transportation Screening Complete Complete Complete  HRI or Home Care Consult   Complete  Social Work Consult for Recovery Care Planning/Counseling   Complete  Palliative Care Screening   Not Applicable  Medication Review Oceanographer) Complete Complete Complete  PCP or Specialist appointment within 3-5 days of discharge Complete Complete   HRI or Home Care Consult Complete Complete   SW Recovery Care/Counseling Consult Complete    Palliative Care Screening Not Applicable Complete   Skilled Nursing Facility Complete Not Applicable

## 2022-12-30 NOTE — Progress Notes (Signed)
   12/30/22 1805  Vitals  Temp 98.5 F (36.9 C)  Pulse Rate (!) 109  Resp 15  BP 117/84  Post Treatment  Dialyzer Clearance Clear  Duration of HD Treatment -hour(s) 3.5 hour(s)  Hemodialysis Intake (mL) 0 mL  Liters Processed 67.9  Fluid Removed (mL) 0 mL  Tolerated HD Treatment Yes  AVG/AVF Arterial Site Held (minutes) 8 minutes  AVG/AVF Venous Site Held (minutes) 8 minutes   Received patient in bed to unit.  Alert and oriented.  Informed consent signed and in chart.   TX duration: 3.5hrs  Patient tolerated well.  Transported back to the room  Alert, without acute distress.  Hand-off given to patient's nurse.   Access used: LAVF Access issues: none  Total UF removed: 0 Medication(s) given: none   [facility]

## 2022-12-30 NOTE — Progress Notes (Signed)
PROGRESS NOTE    Justin [name]  GMW:[id number] DOB: October 05, 1945 DOA: 12/13/2022 PCP: [name], DO   Brief Narrative: Justin [name] is a 77 y.o. male with a history of atrial fibrillation on Eliquis, rectal AVM with recurrent GI bleeding, ESRD on hemodialysis, anemia, chronic hypotension, moderate aortic stenosis.  Patient presented secondary to rectal bleeding with associated worsening hypotension.  Patient required initial transfusion of 3 units of PRBC in addition to [facility] and was started on vasopressor support to manage hypotension from hemorrhagic shock.  Patient admitted to ICU on admission.  While in ICU, patient was evaluated by gastroenterology. 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  Vasopressors weaned off and patient transferred to the hospitalist service on 6/10. On 6/11, patient developed recurrent severe hypotension requiring Levophed and transfer back to ICU; unknown etiology. Patient transferred back again to the hospitalist service on 6/12.   Assessment and Plan:  Acute lower GI bleed Present on admission.  Complicated by Eliquis use.  [place] [facility]ology was consulted and performed upper endoscopy and colonoscopy. 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 GI bleeding appears to be resolved.  Hemorrhagic shock Secondary to GI bleed and complicated by Eliquis use and underlying baseline hypotension. Patient required ICU admission 6/3 for vasopressor support. Patient managed on Levophed which was successfully weaned off on 6/7 with management of GI bleeding. Resolved. Hemoglobin stable.  Acute on chronic anemia Baseline  creatinine appears to be around 8. Acute anemia secondary to acute hemorrhage. Patient has received 3 units of PRBC while admitted. Hemoglobin stable.  Hypotension Chronic. Patient is managed on midodrine as an outpatient. Complicated by acute GI bleeding and need for vasopressor support this admission. Home midodrine dose increased. -Continue midodrine  Atrial flutter/atrial fibrillation, unspecified Chronic issue. Patient is not on antiarrhythmia or rate control medication. He is managed on Eliquis as an outpatient which is now discontinued secondary to high risk of bleed with significant GI bleeding. Patient developed RVR this admission and was started on amiodarone per prior discussion with cardiology, Dr. Anne Fu. 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  Aortic stenosis Moderate stenosis noted on most recent Transthoracic Echocardiogram from 5/14.   ESRD on HD Nephrology consulted. Patient is receiving hemodialysis while inpatient.  History of acute cholecystitis Recent. S/p percutaneous cholecystostomy tube placed in April of 2024 and still present.  Diabetes mellitus type 2 Controlled. Hemoglobin A1C of 5.1%. patient is not on medication management.  Diabetic neuropathy -Continue Lyrica 75 mg daily  GERD -Continue Protonix  Pressure injury Left posterior heel, right heel. Present on admission.    DVT prophylaxis: SCDs Code Status:   Code Status: DNR Family Communication: None at bedside Disposition Plan: Medically stable for discharge. Discharge likely to SNF pending bed availability.   Consultants:  PCCM [facility]  Procedures:  6/6: Colonoscopy; Upper GI endoscopy  Antimicrobials: None    Subjective: No concerns.  Feels well.   Objective: BP (!) 115/98   Pulse (!) 110   Temp 98 F (36.7  C)   Resp 12   Ht 5\' 10"  (1.778 m)   Wt 87 kg   SpO2 100%   BMI 27.52 kg/m   Examination:  General exam: Appears calm and comfortable. Lying in bed.  Respiratory system: Respiratory effort normal. Cardiovascular system: S1 & S2 heard, RRR. No murmurs, rubs, gallops or clicks. Gastrointestinal system: Abdomen is nondistended, soft and nontender. No organomegaly or masses felt. Normal bowel sounds heard. Central nervous system: Asleep but easily awakes. Oriented. No focal neurological deficits. Musculoskeletal: No edema. No calf tenderness Skin: No cyanosis. No rashes Psychiatry: Judgement and insight appear normal. Mood & affect appropriate.    Data Reviewed: I have personally reviewed following labs and imaging studies  CBC Lab Results  Component Value Date   WBC 6.3 12/30/2022   RBC 2.90 (L) 12/30/2022   HGB 9.3 (L) 12/30/2022   HCT 27.8 (L) 12/30/2022   MCV 95.9 12/30/2022   MCH 32.1 12/30/2022   PLT 218 12/30/2022   MCHC 33.5 12/30/2022   RDW 16.1 (H) 12/30/2022   LYMPHSABS 2.0 12/30/2022   MONOABS 0.6 12/30/2022   EOSABS 0.2 12/30/2022   BASOSABS 0.1 12/30/2022     Last metabolic panel Lab Results  Component Value Date   NA 139 12/30/2022   K 4.1 12/30/2022   CL 102 12/30/2022   CO2 28 12/30/2022   BUN 15 12/30/2022   CREATININE 6.27 (H) 12/30/2022   GLUCOSE 77 12/30/2022   GFRNONAA 9 (L) 12/30/2022   GFRAA 8 (L) 02/01/2020   CALCIUM 7.9 (L) 12/30/2022   PHOS 2.8 12/30/2022   PROT 5.9 (L) 12/28/2022   ALBUMIN 2.4 (L) 12/30/2022   LABGLOB 3.7 10/01/2015   AGRATIO 0.9 10/01/2015   BILITOT 1.0 12/28/2022   ALKPHOS 50 12/28/2022   AST 17 12/28/2022   ALT 11 12/28/2022   ANIONGAP 9 12/30/2022    GFR: Estimated Creatinine Clearance: 10 mL/min (A) (by C-G formula based on SCr of 6.27 mg/dL (H)).  No results found for this or any previous visit (from the past 240  hour(s)).     Radiology Studies: No results found.    LOS: 16 days    Jacquelin Hawking, MD [facility] 12/30/2022, 5:47 PM   If 7PM-7AM, please contact night-coverage [email or web address]

## 2022-12-30 NOTE — Progress Notes (Signed)
Patient refused labs this am  

## 2022-12-31 DIAGNOSIS — I864 Gastric varices: Secondary | ICD-10-CM | POA: Diagnosis not present

## 2022-12-31 DIAGNOSIS — R578 Other shock: Secondary | ICD-10-CM | POA: Diagnosis not present

## 2022-12-31 DIAGNOSIS — K626 Ulcer of anus and rectum: Secondary | ICD-10-CM | POA: Diagnosis not present

## 2022-12-31 DIAGNOSIS — N186 End stage renal disease: Secondary | ICD-10-CM | POA: Diagnosis not present

## 2022-12-31 MED ORDER — CHLORHEXIDINE GLUCONATE CLOTH 2 % EX PADS
6.0000 | MEDICATED_PAD | Freq: Every day | CUTANEOUS | Status: DC
  Administered 2022-12-31 – 2023-01-01 (×2): 6 via TOPICAL

## 2022-12-31 NOTE — Progress Notes (Signed)
PROGRESS NOTE    Justin [name]  ZOX:[id number] DOB: 09/04/1945 DOA: 12/13/2022 PCP: [name], DO   Brief Narrative: Justin [name] is a 77 y.o. male with a history of atrial fibrillation on Eliquis, rectal AVM with recurrent GI bleeding, ESRD on hemodialysis, anemia, chronic hypotension, moderate aortic stenosis.  Patient presented secondary to rectal bleeding with associated worsening hypotension.  Patient required initial transfusion of 3 units of PRBC in addition to [facility] and was started on vasopressor support to manage hypotension from hemorrhagic shock.  Patient admitted to ICU on admission.  While in ICU, patient was evaluated by gastroenterology. 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  Vasopressors weaned off and patient transferred to the hospitalist service on 6/10. On 6/11, patient developed recurrent severe hypotension requiring Levophed and transfer back to ICU; unknown etiology. Patient transferred back again to the hospitalist service on 6/12.   Assessment and Plan:  Acute lower GI bleed Present on admission.  Complicated by Eliquis use.  [place] [facility] was consulted and performed upper endoscopy and colonoscopy. 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 GI bleeding appears to be resolved.  Hemorrhagic shock Secondary to GI bleed and complicated by Eliquis use and underlying baseline hypotension. Patient required ICU admission 6/3 for vasopressor support. Patient managed on Levophed which was successfully weaned off on 6/7 with management of GI bleeding. Resolved. Hemoglobin stable.  Acute on chronic anemia Baseline  creatinine appears to be around 8. Acute anemia secondary to acute hemorrhage. Patient has received 3 units of PRBC while admitted. Hemoglobin stable.  Hypotension Chronic. Patient is managed on midodrine as an outpatient. Complicated by acute GI bleeding and need for vasopressor support this admission. Home midodrine dose increased. -Continue midodrine  Atrial flutter/atrial fibrillation, unspecified Chronic issue. Patient is not on antiarrhythmia or rate control medication. He is managed on Eliquis as an outpatient which is now discontinued secondary to high risk of bleed with significant GI bleeding. Patient developed RVR this admission and was started on amiodarone per prior discussion with cardiology, Dr. Anne Fu. 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  Aortic stenosis Moderate stenosis noted on most recent Transthoracic Echocardiogram from 5/14.   ESRD on HD Nephrology consulted. Patient is receiving hemodialysis while inpatient.  History of acute cholecystitis Recent. S/p percutaneous cholecystostomy tube placed in April of 2024 and still present.  Diabetes mellitus type 2 Controlled. Hemoglobin A1C of 5.1%. patient is not on medication management.  Diabetic neuropathy -Continue Lyrica 75 mg daily  GERD -Continue Protonix  Pressure injury Left posterior heel, right heel. Present on admission.    DVT prophylaxis: SCDs Code Status:   Code Status: DNR Family Communication: None at bedside Disposition Plan: Medically stable for discharge. Discharge likely to SNF pending bed availability.   Consultants:  PCCM [facility] [place] [facility]  Procedures:  6/6: Colonoscopy; Upper GI endoscopy  Antimicrobials: None    Subjective: No concerns  today. Feels well.  Objective: BP 94/67 (BP Location: Right Arm)   Pulse (!) 106   Temp  97.6 F (36.4 C) (Oral)   Resp 17   Ht 5\' 10"  (1.778 m)   Wt 87 kg   SpO2 100%   BMI 27.52 kg/m   Examination:  General exam: Appears calm and comfortable Respiratory system: Clear to auscultation. Respiratory effort normal. Cardiovascular system: S1 & S2 heard, RRR. Systolic murmur Gastrointestinal system: Abdomen is nondistended, soft and nontender. Normal bowel sounds heard. Central nervous system: Alert and oriented. No focal neurological deficits. Psychiatry: Judgement and insight appear normal. Mood & affect appropriate.    Data Reviewed: I have personally reviewed following labs and imaging studies  CBC Lab Results  Component Value Date   WBC 6.3 12/30/2022   RBC 2.90 (L) 12/30/2022   HGB 9.3 (L) 12/30/2022   HCT 27.8 (L) 12/30/2022   MCV 95.9 12/30/2022   MCH 32.1 12/30/2022   PLT 218 12/30/2022   MCHC 33.5 12/30/2022   RDW 16.1 (H) 12/30/2022   LYMPHSABS 2.0 12/30/2022   MONOABS 0.6 12/30/2022   EOSABS 0.2 12/30/2022   BASOSABS 0.1 12/30/2022     Last metabolic panel Lab Results  Component Value Date   NA 139 12/30/2022   K 4.1 12/30/2022   CL 102 12/30/2022   CO2 28 12/30/2022   BUN 15 12/30/2022   CREATININE 6.27 (H) 12/30/2022   GLUCOSE 77 12/30/2022   GFRNONAA 9 (L) 12/30/2022   GFRAA 8 (L) 02/01/2020   CALCIUM 7.9 (L) 12/30/2022   PHOS 2.8 12/30/2022   PROT 5.9 (L) 12/28/2022   ALBUMIN 2.4 (L) 12/30/2022   LABGLOB 3.7 10/01/2015   AGRATIO 0.9 10/01/2015   BILITOT 1.0 12/28/2022   ALKPHOS 50 12/28/2022   AST 17 12/28/2022   ALT 11 12/28/2022   ANIONGAP 9 12/30/2022    GFR: Estimated Creatinine Clearance: 10 mL/min (A) (by C-G formula based on SCr of 6.27 mg/dL (H)).  No results found for this or any previous visit (from the past 240 hour(s)).     Radiology Studies: No results found.    LOS: 17 days    Jacquelin Hawking, MD [facility] 12/31/2022, 10:09 AM   If 7PM-7AM, please contact night-coverage [email or web address]

## 2022-12-31 NOTE — Progress Notes (Signed)
OT Cancellation Note  Patient Details Name: Justin [name] MRN: [id number] DOB: 09-16-1945   Cancelled Treatment:    Reason Eval/Treat Not Completed: Fatigue/lethargy limiting ability to participate, attempted earlier a few hours ago and Pt appeared to be sleeping. Pt still sleeping at this time, attempted multiple times to wake up, Pt very groggy, not able to participate, immediately went back to sleep.   Alexis Goodell 12/31/2022, 3:47 PM

## 2022-12-31 NOTE — Progress Notes (Signed)
PT Cancellation Note  Patient Details Name: Justin [name] MRN: [id number] DOB: [birth year]   Cancelled Treatment:    Reason Eval/Treat Not Completed: Fatigue/lethargy limiting ability to participate. Checked on pt x2, initially pt sleeping and adamantly declined working with PT. On second attempt pt still sleeping hard, opted not to wake him. Will check back at next available date to continue with PT POC.    Marylynn Pearson 12/31/2022, 3:17 PM  Conni Slipper, PT, DPT Acute Rehabilitation Services Secure Chat Preferred Office: [phone]

## 2022-12-31 NOTE — TOC Progression Note (Addendum)
Transition of Care [facility]) - Initial/Assessment Note    Patient Details  Name: Justin [name] MRN: [id number] Date of Birth: 21-Aug-1945  [facility] CM/SW Contact:    Ralene Bathe, LCSW Phone Number: 12/31/2022, 8:51 AM  Clinical Narrative:                 Late Entry: Addendum:  [facility] authorization approved.  Authorization number= [id number].  MD informed.  Insurance authorization for SNF is pending.  09:25-  The insurance company is offering a peer to peer to be completed by 13:30.  MD notified.  TOC following.  Expected Discharge Plan: Home w Home Health Services Barriers to Discharge: Continued Medical Work up   Patient Goals and CMS Choice Patient states their goals for this hospitalization and ongoing recovery are:: To return home          Expected Discharge Plan and Services In-house Referral: Clinical Social Work     Living arrangements for the past 2 months: Skilled Nursing Facility, Single Family Home                                      Prior Living Arrangements/Services Living arrangements for the past 2 months: Skilled Nursing Facility, Single Family Home Lives with:: Relatives Patient language and need for interpreter reviewed:: Yes Do you feel safe going back to the place where you live?: Yes      Need for Family Participation in Patient Care: Yes (Comment) Care giver support system in place?: Yes (comment)   Criminal Activity/Legal Involvement Pertinent to Current Situation/Hospitalization: No - Comment as needed  Activities of Daily Living      Permission Sought/Granted   Permission granted to share information with : Yes, Verbal Permission Granted     Permission granted to share info w AGENCY: home health agencies, primary care providers        Emotional Assessment Appearance:: Appears stated age Attitude/Demeanor/Rapport: Engaged Affect (typically observed): Adaptable Orientation: : Oriented to Self, Oriented  to Place, Oriented to  Time, Oriented to Situation Alcohol / Substance Use: Not Applicable Psych Involvement: No (comment)  Admission diagnosis:  End stage renal disease (HCC) [N18.6] Hemorrhagic shock (HCC) [R57.8] Gastrointestinal hemorrhage, unspecified gastrointestinal hemorrhage type [K92.2] Patient Active Problem List   Diagnosis Date Noted   Shock (HCC) 12/22/2022   Rectal ulcer 12/17/2022   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   Hemorrhagic shock (HCC) 12/14/2022   Acute encephalopathy 11/09/2022   Syncope 11/09/2022   Acute blood loss anemia 11/09/2022   Melena 11/09/2022   History of colonic polyps 11/09/2022   End stage renal disease (HCC) 11/09/2022   Gastric nodule 11/09/2022   Rectal bleeding 09/17/2022   Colon ulcer 09/17/2022   Lower GI bleeding 09/17/2022   GI bleed 09/16/2022   Prolonged QT interval 09/16/2022   History of prostate cancer 09/16/2022   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   History of colon polyps 09/16/2022   Lower GI bleed 09/12/2022   Acute GI bleeding 09/10/2022   Wide-complex tachycardia 06/17/2022   Hypotension 06/13/2022   Obesity (BMI 30-39.9) 06/13/2022   Perirectal abscess 06/13/2022   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   Murmur, cardiac 01/27/2022   Chronic back pain 01/27/2022   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/27/2022   GERD (gastroesophageal reflux disease) 11/11/2021   High risk medication use 11/11/2021   History of cardioversion 11/11/2021  Paroxysmal atrial fibrillation with RVR (HCC) 11/11/2021   PVC (premature ventricular contraction) 11/11/2021   Nonrheumatic aortic valve stenosis 11/06/2021   Mixed hyperlipidemia 11/06/2021   Abdominal mass 05/05/2021   First degree AV block 07/13/2020   Paroxysmal A-fib (HCC) 01/29/2020   Wound infection 01/28/2020   H/O vitrectomy 12/07/2019   Macular pucker, 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  determined by examination associated with type 2 diabetes mellitus (HCC) 10/24/2019   Stable treated proliferative diabetic retinopathy of left eye determined by examination associated with type 2 diabetes mellitus (HCC) 10/24/2019   ESRD on hemodialysis (HCC) 07/29/2017   Anticoagulated 07/13/2017   Malignant neoplasm of prostate (HCC) 07/12/2017   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 [place] [address] Phone: [phone] Fax: [phone]     Social Determinants of Health (SDOH) Social History: SDOH Screenings   Food Insecurity: No Food Insecurity (11/24/2022)  Housing: Low Risk  (11/24/2022)  Transportation Needs: No Transportation Needs (11/24/2022)  Utilities: Not At Risk (11/24/2022)  Tobacco Use: Medium Risk (12/20/2022)   SDOH Interventions:     Readmission Risk Interventions    11/14/2022   10:08 AM 09/18/2022    4:10 PM 06/17/2022    2:47 PM  Readmission Risk Prevention Plan  Transportation Screening Complete Complete Complete  HRI or Home Care Consult   Complete  Social Work Consult for Recovery Care Planning/Counseling   Complete  Palliative Care Screening   Not Applicable  Medication Review Oceanographer) Complete Complete Complete  PCP or Specialist appointment within 3-5 days of discharge Complete Complete   HRI or Home Care Consult Complete Complete   SW Recovery Care/Counseling Consult Complete    Palliative Care Screening Not Applicable Complete   Skilled Nursing Facility Complete Not Applicable

## 2022-12-31 NOTE — Progress Notes (Signed)
[name] Progress Note   Subjective: Seen in room - no new complaints. No issues with dialysis yesterday. No news about discharge yet.   Objective Vitals:   12/30/22 2156 12/31/22 0021 12/31/22 0530 12/31/22 0800  BP: (!) 100/42 115/65 97/66 94/67   Pulse: (!) 108 (!) 103 (!) 112 (!) 106  Resp: 18 16 16 17   Temp: 97.6 F (36.4 C) 98 F (36.7 C) 98.2 F (36.8 C) 97.6 F (36.4 C)  TempSrc:  Oral Oral Oral  SpO2: 98% 100% 100% 100%  Weight:      Height:         Additional Objective Labs: Basic Metabolic Panel: Recent Labs  Lab 12/27/22 0421 12/28/22 0450 12/30/22 1628  NA 136 136 139  K 3.8 3.8 4.1  CL 97* 97* 102  CO2 29 27 28   GLUCOSE 93 78 77  BUN 8 15 15   CREATININE 5.38* 6.72* 6.27*  CALCIUM 8.0* 7.9* 7.9*  PHOS 3.4 3.6 2.8    CBC: Recent Labs  Lab 12/25/22 0212 12/28/22 0450 12/30/22 1628  WBC 7.2 6.5 6.3  NEUTROABS 4.3 3.8 3.4  HGB 8.9* 9.2* 9.3*  HCT 28.2* 29.5* 27.8*  MCV 97.9 94.9 95.9  PLT 197 221 218    Blood Culture    Component Value Date/Time   SDES ABSCESS 06/15/2022 1344   SPECREQUEST PERIRECTAL 06/15/2022 1344   CULT  06/15/2022 1344    FEW ESCHERICHIA COLI NO ANAEROBES ISOLATED Performed at [facility], [address] [place], [place] [address]    REPTSTATUS 06/20/2022 FINAL 06/15/2022 1344     Physical Exam General: Alert, sitting up in bed, nad Heart: Tachy, SEM Lungs: Clear bilaterally  Abdomen: soft non-tender Extremities: no LE edema Dialysis Access: LUE AVF +bruit   Medications:  sodium chloride Stopped (12/24/22 0322)    sodium chloride   Intravenous Once   amiodarone  200 mg Oral BID   Followed by   Melene Muller ON 01/06/2023] amiodarone  200 mg Oral Daily   calcitRIOL  0.75 mcg Oral Q M,W,F   cinacalcet  30 mg Oral Q breakfast   darbepoetin (ARANESP) injection - DIALYSIS  200 mcg Subcutaneous Q Mon-1800   midodrine  20 mg Oral Q8H   pantoprazole  40 mg Oral BID   pregabalin  75 mg  Oral Daily     [facility]: [facility] M[facility] 3h  450/600  91.6kg  2/2.5 bath  LUA AVF  Hep none - coming in under dry wt around 89kg last 2 sessions - rocaltrol 0.75 mcg po tiw - venofer 50 iv weekly - mircera 200 mcg IV q 2 wks, last 5/27, due 6/10 - last Hb 9.6 on 5/29   Assessment/Plan: Acute on chronic hypotension- in shock initially related to acute GIB and rx'd in ICU from 6/02- 6/10. Pt moved to floor. On 6/11 BP's had dropped again and pt moved back to ICU briefly. Unclear cause, BP and pt improved and was again moved out of ICU. Cont po midodrine 20 tid.  GI bleed - S/P 3 units PRBC's. CT angio was negative for active bleed. Eliquis was held. Had recurrent rectal bleeding/ hematochezia on 12/15/22, seen by GI and had EGD/ colonoscopy on 6/07.  EGD was negative, colonoscopy with rectal ulcer s/p clip.  Hgb stabilized in the 7s and now is in the 8-9 range. Transfuse prn.    ESRD - usual HD MWF. Next HD 6/21  Volume  -  was 7kg under dry wt  w/ hypotension, improved w/ IVF"s and not pulling fluid w/ HD. Pt remains euvolemic and wt's are not increasing, will cont to keep even w/ HD.   Anemia esrd - +ABLA also as above.  S/p blood transfusions x 3.  Last OP esa was given 5/27. Getting darbe 200 mcg sq here on Mondays.   Metabolic bone disease -  CCa and phos are in range. Continues his home meds  here w/ rocaltrol and sensipar.   A fib/flutter/RVR - eliquis on hold at this time as risks outweigh benefits. Amiodarone started here.     Acute cholecystitis s/p percutaneous cholecystostomy tube - from OSH 4/2- 4/23 and felt to be too high risk for surgical intervention.  Drain exchanged 12/01/22 and pt is to f/u with surgery at [facility] in July.  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  Currently DNR.  Dispo - appears for SNF placement   Justin [name] Justin Givens PA-C [facility] 12/31/2022,9:46 AM

## 2023-01-01 DIAGNOSIS — R578 Other shock: Secondary | ICD-10-CM | POA: Diagnosis not present

## 2023-01-01 DIAGNOSIS — K626 Ulcer of anus and rectum: Secondary | ICD-10-CM | POA: Diagnosis not present

## 2023-01-01 DIAGNOSIS — N186 End stage renal disease: Secondary | ICD-10-CM | POA: Diagnosis not present

## 2023-01-01 DIAGNOSIS — I864 Gastric varices: Secondary | ICD-10-CM | POA: Diagnosis not present

## 2023-01-01 LAB — RENAL FUNCTION PANEL
Albumin: 2.8 g/dL — ABNORMAL LOW (ref 3.5–5.0)
Anion gap: 9 (ref 5–15)
BUN: 11 mg/dL (ref 8–23)
CO2: 26 mmol/L (ref 22–32)
Calcium: 8 mg/dL — ABNORMAL LOW (ref 8.9–10.3)
Chloride: 101 mmol/L (ref 98–111)
Creatinine, Ser: 5.29 mg/dL — ABNORMAL HIGH (ref 0.61–1.24)
GFR, Estimated: 10 mL/min — ABNORMAL LOW (ref >60)
Glucose, Bld: 116 mg/dL — ABNORMAL HIGH (ref 70–99)
Phosphorus: 2.6 mg/dL (ref 2.5–4.6)
Potassium: 3.7 mmol/L (ref 3.5–5.1)
Sodium: 136 mmol/L (ref 135–145)

## 2023-01-01 LAB — CBC
HCT: 27.3 % — ABNORMAL LOW (ref 39.0–52.0)
Hemoglobin: 8.7 g/dL — ABNORMAL LOW (ref 13.0–17.0)
MCH: 30.6 pg (ref 26.0–34.0)
MCHC: 31.9 g/dL (ref 30.0–36.0)
MCV: 96.1 fL (ref 80.0–100.0)
Platelets: 201 10*3/uL (ref 150–400)
RBC: 2.84 MIL/uL — ABNORMAL LOW (ref 4.22–5.81)
RDW: 15.7 % — ABNORMAL HIGH (ref 11.5–15.5)
WBC: 5.2 10*3/uL (ref 4.0–10.5)
nRBC: 0 % (ref 0.0–0.2)

## 2023-01-01 MED ORDER — ALBUMIN HUMAN 25 % IV SOLN
INTRAVENOUS | Status: AC
  Administered 2023-01-01: 25 g
  Filled 2023-01-01: qty 100

## 2023-01-01 MED ORDER — LIDOCAINE HCL (PF) 1 % IJ SOLN: 5.0000 mL | INTRAMUSCULAR | Status: DC | PRN

## 2023-01-01 MED ORDER — PENTAFLUOROPROP-TETRAFLUOROETH EX AERO
1.0000 | INHALATION_SPRAY | CUTANEOUS | Status: DC | PRN
Start: 2023-01-01 — End: 2023-01-01

## 2023-01-01 MED ORDER — HEPARIN SODIUM (PORCINE) 1000 UNIT/ML DIALYSIS: 1000.0000 [IU] | INTRAMUSCULAR | Status: DC | PRN

## 2023-01-01 MED ORDER — ANTICOAGULANT SODIUM CITRATE 4% (200MG/5ML) IV SOLN
5.0000 mL | Status: DC | PRN
Start: 2023-01-01 — End: 2023-01-01

## 2023-01-01 MED ORDER — ALTEPLASE 2 MG IJ SOLR: 2.0000 mg | Freq: Once | INTRAMUSCULAR | Status: DC | PRN

## 2023-01-01 MED ORDER — LIDOCAINE-PRILOCAINE 2.5-2.5 % EX CREA: 1.0000 | TOPICAL_CREAM | CUTANEOUS | Status: DC | PRN

## 2023-01-01 MED ORDER — ANTICOAGULANT SODIUM CITRATE 4% (200MG/5ML) IV SOLN
5.0000 mL | Status: DC | PRN
  Filled 2023-01-01: qty 5

## 2023-01-01 MED ORDER — LIDOCAINE-PRILOCAINE 2.5-2.5 % EX CREA
1.0000 | TOPICAL_CREAM | CUTANEOUS | Status: DC | PRN
Start: 2023-01-01 — End: 2023-01-01

## 2023-01-01 MED ORDER — PENTAFLUOROPROP-TETRAFLUOROETH EX AERO: 1.0000 | INHALATION_SPRAY | CUTANEOUS | Status: DC | PRN

## 2023-01-01 NOTE — Progress Notes (Signed)
Received patient in bed .[place]ake,alert and oriented x 4.Consent verified.  Medicine given : 25 g Albumin.  Access used: Left upper arm AVF that worked well.  Duration of treatment: 3.5 hours.  Fluid removed :Even.  Hemo comment:Came into HD unit with a low BP. Renal PA. Made aware.Tolerated treatment well.  Hand off to the patient's nurse.

## 2023-01-01 NOTE — Progress Notes (Signed)
[facility] Progress Note   Subjective:  no new complaints  Objective Vitals:   01/01/23 1236 01/01/23 1305 01/01/23 1416 01/01/23 1527  BP: 131/82 (!) 120/93 127/81 129/84  Pulse: (!) 107 (!) 109 68 (!) 111  Resp: 17 18 18 17   Temp:  (!) 97.3 F (36.3 C) 98 F (36.7 C) 98.2 F (36.8 C)  TempSrc:  Oral  Oral  SpO2: 97% 100% 92% 100%  Weight:      Height:         Additional Objective Labs: Basic Metabolic Panel: Recent Labs  Lab 12/28/22 0450 12/30/22 1628 01/01/23 0914  NA 136 139 136  K 3.8 4.1 3.7  CL 97* 102 101  CO2 27 28 26   GLUCOSE 78 77 116*  BUN 15 15 11   CREATININE 6.72* 6.27* 5.29*  CALCIUM 7.9* 7.9* 8.0*  PHOS 3.6 2.8 2.6   CBC: Recent Labs  Lab 12/28/22 0450 12/30/22 1628 01/01/23 1004  WBC 6.5 6.3 5.2  NEUTROABS 3.8 3.4  --   HGB 9.2* 9.3* 8.7*  HCT 29.5* 27.8* 27.3*  MCV 94.9 95.9 96.1  PLT 221 218 201   Blood Culture    Component Value Date/Time   SDES ABSCESS 06/15/2022 1344   SPECREQUEST PERIRECTAL 06/15/2022 1344   CULT  06/15/2022 1344    FEW ESCHERICHIA COLI NO ANAEROBES ISOLATED Performed at [facility], [address], [place], [place] [address]    REPTSTATUS 06/20/2022 FINAL 06/15/2022 1344     Physical Exam General: Alert, sitting up in bed, nad Heart: Tsystolic murmur Lungs: Clear bilaterally  Abdomen: soft non-tender Extremities: no LE edema Dialysis Access: LUE AVF +bruit   Medications:  sodium chloride Stopped (12/24/22 0322)    sodium chloride   Intravenous Once   amiodarone  200 mg Oral BID   Followed by   Melene Muller ON 01/06/2023] amiodarone  200 mg Oral Daily   calcitRIOL  0.75 mcg Oral Q M,W,F   Chlorhexidine Gluconate Cloth  6 each Topical Q0600   cinacalcet  30 mg Oral Q breakfast   darbepoetin (ARANESP) injection - DIALYSIS  200 mcg Subcutaneous Q Mon-1800   midodrine  20 mg Oral Q8H   pantoprazole  40 mg Oral BID   pregabalin  75 mg Oral Daily     OP HD: [facility] M[facility] 3h   450/600  91.6kg  2/2.5 bath  LUA AVF  Hep none - coming in under dry wt around 89kg last 2 sessions - rocaltrol 0.75 mcg po tiw - venofer 50 iv weekly - mircera 200 mcg IV q 2 wks, last 5/27, due 6/10 - last Hb 9.6 on 5/29   Assessment/Plan: Acute on chronic hypotension- in shock initially related to acute GIB and rx'd in ICU from 6/02- 6/10. Pt moved to floor. On 6/11 BP's had dropped again and pt moved back to ICU briefly. Unclear cause, BP and pt improved and was again moved out of ICU. Cont po midodrine 20 tid.  GI bleed - S/P 3 units PRBC's. CT angio was negative for active bleed. Eliquis was held. Had recurrent rectal bleeding/ hematochezia on 12/15/22, seen by GI and had EGD/ colonoscopy on 6/07.  EGD was negative, colonoscopy with rectal ulcer s/p clip.  Hgb stabilized in the 7s and now is in the 8-9 range. Transfuse prn.    ESRD - usual HD MWF. S/p HD 6/21  Volume  -  was 7kg under dry wt w/ hypotension, improved w/ IVF"s and not  pulling fluid w/ HD. Pt remains euvolemic and wt's are not increasing, will cont to keep even w/ HD.   Anemia esrd - +ABLA also as above.  S/p blood transfusions x 3.  Last OP esa was given 5/27. Getting darbe 200 mcg sq here on Mondays.   Metabolic bone disease -  CCa and phos are in range. Continues his home meds  here w/ rocaltrol and sensipar.   A fib/flutter/RVR - eliquis on hold at this time as risks outweigh benefits. Amiodarone started here.     Acute cholecystitis s/p percutaneous cholecystostomy tube - from OSH 4/2- 4/23 and felt to be too high risk for surgical intervention.  Drain exchanged 12/01/22 and pt is to f/u with surgery at [facility] in July.  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  Currently DNR.  Dispo - appears for SNF placement   Bufford Buttner MD [facility] 01/01/2023,4:10 PM

## 2023-01-01 NOTE — Progress Notes (Signed)
Physical Therapy Treatment Patient Details Name: Justin [name] MRN: [id number] DOB: Sep 17, 1945 Today's Date: 01/01/2023   History of Present Illness Patient is a 77 y/o male admitted 12/14/22 with recurrent GIB. went to stepdown unit, then back to ICU on 6/11 for sustained hypotension. PMH includes a-fib on Eliquis, rectal AVM, ESRD on HD, chronic hypotension, mod AS, Prostate CA, DM, retinopathy, peripheral neuropathy, gout, HTN, admission 4/2-4/23 for cholecystitis, septic shock, NSTEMI, placed cholecystostomy tube, admission 5/1-5/14 for lower GIB/PNA.    PT Comments    Pt tolerates treatment well, transferring multiple times. Pt continues to demonstrate significant LE weakness, unable to progress to gait training at this time due to knee flexion during SLS with attempts at ambulation. Pt will benefit from continued acute PT services in an effort to improve endurance and reduce falls risk. PT continues to recommend short term inpatient PT services at the time of discharge.   Recommendations for follow up therapy are one component of a multi-disciplinary discharge planning process, led by the attending physician.  Recommendations may be updated based on patient status, additional functional criteria and insurance authorization.  Follow Up Recommendations  Can patient physically be transported by private vehicle: No    Assistance Recommended at Discharge Frequent or constant Supervision/Assistance  Patient can return home with the following A lot of help with walking and/or transfers;A lot of help with bathing/dressing/bathroom;Assist for transportation;Help with stairs or ramp for entrance   Equipment Recommendations   (defer to post-acute)    Recommendations for Other Services       Precautions / Restrictions Precautions Precautions: Fall Precaution Comments: hypersensitivity/pain B feet, bilat darco shoes donned during session; watch BP Restrictions Weight Bearing Restrictions: No      Mobility  Bed Mobility Overal bed mobility: Needs Assistance Bed Mobility: Supine to Sit     Supine to sit: Supervision, HOB elevated          Transfers Overall transfer level: Needs assistance Equipment used: Rolling walker (2 wheels) Transfers: Sit to/from Stand Sit to Stand: Mod assist, Min assist, From elevated surface           General transfer comment: modA initial transfer progressing to minA with 2nd and 3rd transfers, verbal cues on increasing trunk flexion and forward rocking for momentum    Ambulation/Gait             Pre-gait activities: one step forward and backward with increase in knee flexion, deferred ambulation training due to high falls risk     Stairs             Wheelchair Mobility    Modified Rankin (Stroke Patients Only)       Balance Overall balance assessment: Needs assistance Sitting-balance support: No upper extremity supported, Feet supported Sitting balance-Leahy Scale: Fair     Standing balance support: Bilateral upper extremity supported, Reliant on assistive device for balance Standing balance-Leahy Scale: Poor                              Cognition Arousal/Alertness: Awake/alert Behavior During Therapy: WFL for tasks assessed/performed Overall Cognitive Status: Within Functional Limits for tasks assessed                                          Exercises      General Comments General comments (skin integrity,  edema, etc.): VSS on RA      Pertinent Vitals/Pain Pain Assessment Pain Assessment: Faces Faces Pain Scale: Hurts even more Pain Location: bilateral feet Pain Descriptors / Indicators: Sore Pain Intervention(s): Monitored during session    Home Living                          Prior Function            PT Goals (current goals can now be found in the care plan section) Acute Rehab PT Goals Patient Stated Goal: To get stronger anf go  home Progress towards PT goals: Progressing toward goals    Frequency    Min 2X/week      PT Plan Current plan remains appropriate    Co-evaluation              AM-PAC PT "6 Clicks" Mobility   Outcome Measure  Help needed turning from your back to your side while in a flat bed without using bedrails?: A Little Help needed moving from lying on your back to sitting on the side of a flat bed without using bedrails?: A Little Help needed moving to and from a bed to a chair (including a wheelchair)?: A Lot Help needed standing up from a chair using your arms (e.g., wheelchair or bedside chair)?: A Little Help needed to walk in hospital room?: Total Help needed climbing 3-5 steps with a railing? : Total 6 Click Score: 13    End of Session Equipment Utilized During Treatment: Gait belt Activity Tolerance: Patient tolerated treatment well Patient left: with call bell/phone within reach;in bed;with bed alarm set Nurse Communication: Mobility status PT Visit Diagnosis: Other abnormalities of gait and mobility (R26.89);Muscle weakness (generalized) (M62.81) Pain - part of body: Ankle and joints of foot     Time: 1520-1545 PT Time Calculation (min) (ACUTE ONLY): 25 min  Charges:  $Therapeutic Activity: 23-37 mins                     Arlyss Gandy, PT, DPT Acute Rehabilitation Office [phone]    Arlyss Gandy 01/01/2023, 4:10 PM

## 2023-01-01 NOTE — Progress Notes (Signed)
PROGRESS NOTE    Justin [name]  RUE:[id number] DOB: 1945-09-25 DOA: 12/13/2022 PCP: [name], DO   Brief Narrative: Justin [name] is a 77 y.o. male with a history of atrial fibrillation on Eliquis, rectal AVM with recurrent GI bleeding, ESRD on hemodialysis, anemia, chronic hypotension, moderate aortic stenosis.  Patient presented secondary to rectal bleeding with associated worsening hypotension.  Patient required initial transfusion of 3 units of PRBC in addition to [facility] and was started on vasopressor support to manage hypotension from hemorrhagic shock.  Patient admitted to ICU on admission.  While in ICU, patient was evaluated by gastroenterology. 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  Vasopressors weaned off and patient transferred to the hospitalist service on 6/10. On 6/11, patient developed recurrent severe hypotension requiring Levophed and transfer back to ICU; unknown etiology. Patient transferred back again to the hospitalist service on 6/12.   Assessment and Plan:  Acute lower GI bleed Present on admission.  Complicated by Eliquis use.  [facility] was consulted and performed upper endoscopy and colonoscopy. 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 GI bleeding appears to be resolved.  Hemorrhagic shock Secondary to GI bleed and complicated by Eliquis use and underlying baseline hypotension. Patient required ICU admission 6/3 for vasopressor support. Patient managed on Levophed which was successfully weaned off on 6/7 with management of GI bleeding. Resolved. Hemoglobin stable.  Acute on chronic anemia Baseline  creatinine appears to be around 8. Acute anemia secondary to acute hemorrhage. Patient has received 3 units of PRBC while admitted. Hemoglobin stable.  Hypotension Chronic. Patient is managed on midodrine as an outpatient. Complicated by acute GI bleeding and need for vasopressor support this admission. Home midodrine dose increased. -Continue midodrine  Atrial flutter/atrial fibrillation, unspecified Chronic issue. Patient is not on antiarrhythmia or rate control medication. He is managed on Eliquis as an outpatient which is now discontinued secondary to high risk of bleed with significant GI bleeding. Patient developed RVR this admission and was started on amiodarone per prior discussion with cardiology, Dr. Anne Fu. 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  Aortic stenosis Moderate stenosis noted on most recent Transthoracic Echocardiogram from 5/14.   ESRD on HD Nephrology consulted. Patient is receiving hemodialysis while inpatient.  History of acute cholecystitis Recent. S/p percutaneous cholecystostomy tube placed in April of 2024 and still present.  Diabetes mellitus type 2 Controlled. Hemoglobin A1C of 5.1%. patient is not on medication management.  Diabetic neuropathy -Continue Lyrica 75 mg daily  GERD -Continue Protonix  Pressure injury Left posterior heel, right heel. Present on admission.    DVT prophylaxis: SCDs Code Status:   Code Status: DNR Family Communication: None at bedside Disposition Plan: Medically stable for discharge. Discharge likely to SNF pending patient's ability to consistently work with physical/occupational therapy.   Consultants:  [facility]ph[facility]  Procedures:  6/6:  Colonoscopy; Upper GI endoscopy  Antimicrobials: None    Subjective: No issues this morning. States he did not work with PT  yesterday because he did not feel good. He was unable to express how exactly he did not feel good.  Objective: BP (!) 120/93   Pulse (!) 109   Temp 97.9 F (36.6 C)   Resp 18   Ht 5\' 10"  (1.778 m)   Wt 87 kg   SpO2 100%   BMI 27.52 kg/m   Examination:  General exam: Appears calm and comfortable. In Hemodialysis. Respiratory system: Clear to auscultation. Respiratory effort normal. Cardiovascular system: S1 & S2 heard, RRR. Systolic murmur. Gastrointestinal system: Abdomen is nondistended, soft and nontender. No organomegaly or masses felt. Normal bowel sounds heard. Central nervous system: Alert and oriented.   Data Reviewed: I have personally reviewed following labs and imaging studies  CBC Lab Results  Component Value Date   WBC 5.2 01/01/2023   RBC 2.84 (L) 01/01/2023   HGB 8.7 (L) 01/01/2023   HCT 27.3 (L) 01/01/2023   MCV 96.1 01/01/2023   MCH 30.6 01/01/2023   PLT 201 01/01/2023   MCHC 31.9 01/01/2023   RDW 15.7 (H) 01/01/2023   LYMPHSABS 2.0 12/30/2022   MONOABS 0.6 12/30/2022   EOSABS 0.2 12/30/2022   BASOSABS 0.1 12/30/2022     Last metabolic panel Lab Results  Component Value Date   NA 136 01/01/2023   K 3.7 01/01/2023   CL 101 01/01/2023   CO2 26 01/01/2023   BUN 11 01/01/2023   CREATININE 5.29 (H) 01/01/2023   GLUCOSE 116 (H) 01/01/2023   GFRNONAA 10 (L) 01/01/2023   GFRAA 8 (L) 02/01/2020   CALCIUM 8.0 (L) 01/01/2023   PHOS 2.6 01/01/2023   PROT 5.9 (L) 12/28/2022   ALBUMIN 2.8 (L) 01/01/2023   LABGLOB 3.7 10/01/2015   AGRATIO 0.9 10/01/2015   BILITOT 1.0 12/28/2022   ALKPHOS 50 12/28/2022   AST 17 12/28/2022   ALT 11 12/28/2022   ANIONGAP 9 01/01/2023    GFR: Estimated Creatinine Clearance: 11.9 mL/min (A) (by C-G formula based on SCr of 5.29 mg/dL (H)).  No results found for this or any previous visit  (from the past 240 hour(s)).     Radiology Studies: No results found.    LOS: 18 days    Jacquelin Hawking, MD [facility] 01/01/2023, 1:22 PM   If 7PM-7AM, please contact night-coverage [email or web address]

## 2023-01-02 DIAGNOSIS — K922 Gastrointestinal hemorrhage, unspecified: Secondary | ICD-10-CM | POA: Diagnosis not present

## 2023-01-02 MED ORDER — AMIODARONE HCL 200 MG PO TABS: ORAL_TABLET | ORAL | Status: DC

## 2023-01-02 MED ORDER — MIDODRINE HCL 10 MG PO TABS: 20.0000 mg | ORAL_TABLET | Freq: Three times a day (TID) | ORAL | Status: DC

## 2023-01-02 NOTE — Progress Notes (Signed)
DISCHARGE NOTE SNF [facility] to be discharged Skilled nursing facility per MD order. Patient verbalized understanding.  Skin clean, dry and intact without evidence of skin break down, no evidence of skin tears noted. IV catheter discontinued intact. Site without signs and symptoms of complications. Dressing and pressure applied. Pt denies pain at the site currently. No complaints noted.  Patient free of lines, drains, and wounds.   Discharge packet assembled. An After Visit Summary (AVS) was printed and given to the EMS personnel. Patient escorted via stretcher and discharged to [facility] via ambulance. Report called to accepting facility; all questions and concerns addressed.   Lorine Bears, RN

## 2023-01-02 NOTE — Plan of Care (Signed)
[facility] Patient Discharge Orders- [facility] CLINIC: [place]  Patient's name: Justin [name] Admit/DC Dates: 12/13/2022 - 01/02/23  Discharge Diagnoses: Lower GIB   Hemorrhagic shock Acute on chronic anemia A fib/flutter - not on AC  Aranesp: Given: yes   Date and amount of last dose: on 12/21/22  Last Hgb: 8.7 PRBC's Given: yes Date/# of units: 3 units pRBC, last on 6/9 ESA dose for discharge: mircera 150 mcg IV q 2 weeks - next on 01/04/23 IV Iron dose at discharge: none  Heparin change: no  EDW Change: yes New EDW: 88kg  Bath Change: no  Access intervention/Change: no Details:  Hectorol/Calcitriol change: no  Discharge Labs: Calcium 8.0 Phosphorus 2.6 Albumin 2.8 K+ 3.7  ONSP - yes  IV Antibiotics: no Details:  On Coumadin?: no Last INR: Next INR: Managed By:   OTHER/APPTS/LAB ORDERS: monthly  labs 1st HD if already missed this month    D/C Meds to be reconciled by nurse after every discharge.  Completed By:   Reviewed by: MD:______ RN_______

## 2023-01-02 NOTE — Discharge Summary (Signed)
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 *  Hospital Course: Justin [name] is a 77 y.o. male with a history of atrial fibrillation on Eliquis, rectal AVM with recurrent GI bleeding, ESRD on hemodialysis, anemia, chronic hypotension, moderate aortic stenosis.  Patient presented secondary to rectal bleeding with associated worsening hypotension.  Patient required initial transfusion of 3 units of PRBC in addition to [facility] and was started on vasopressor support to manage hypotension from hemorrhagic shock.  Patient admitted to ICU on admission.  While in ICU, patient was evaluated by gastroenterology. 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  Vasopressors weaned off and patient transferred to the hospitalist service on 6/10. On 6/11, patient developed recurrent severe hypotension requiring Levophed and transfer back to ICU; unknown etiology. Patient transferred back again to the hospitalist service on 6/12. Patient tolerated physical therapy prior to discharge.  Assessment and Plan:  Acute  lower GI bleed Present on admission.  Complicated by Eliquis use.  [name] was consulted and performed upper endoscopy and colonoscopy. 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 GI bleeding appears to be resolved.   Hemorrhagic shock Secondary to GI bleed and complicated by Eliquis use and underlying baseline hypotension. Patient required ICU admission 6/3 for vasopressor support. Patient managed on Levophed which was successfully weaned off on 6/7 with management of GI bleeding. Resolved. Hemoglobin stable.   Acute on chronic anemia Baseline creatinine appears to be around 8. Acute anemia secondary to acute hemorrhage. Patient has received 3 units of PRBC while admitted. Hemoglobin stable.   Hypotension Chronic. Patient is managed on midodrine as an outpatient. Complicated by acute GI bleeding and need for vasopressor support this admission. Home midodrine dose increased. Continue midodrine on discharge.   Atrial flutter/atrial fibrillation, unspecified Chronic issue. Patient is not on antiarrhythmia or rate control medication. He is managed on Eliquis as an outpatient which is now discontinued secondary to high risk of bleed with significant GI bleeding. Patient developed RVR this admission and was started on amiodarone per prior discussion with cardiology, Dr. Anne [name].  Patient loaded with amiodarone 200 mg twice daily with recommendations to decrease to 200 mg.  Will discharge to complete amiodarone load with titration down to maintenance dosing on 6/26.  Eliquis discontinued on discharge.   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   Aortic stenosis Moderate stenosis noted on most recent Transthoracic  Echocardiogram from 5/14.    ESRD on HD Nephrology consulted. Patient  is receiving hemodialysis while inpatient.   History of acute cholecystitis Recent. S/p percutaneous cholecystostomy tube placed in April of 2024 and still present.   Diabetes mellitus type 2 Controlled. Hemoglobin A1C of 5.1%. patient is not on medication management.   Diabetic neuropathy Continue Lyrica 75 mg daily   GERD Continue Protonix   Pressure injury Left posterior heel, right heel. Present on admission.   Consultants:  [facility] gastroenterology Nephrology  Procedures performed:   6/6: Colonoscopy; Upper GI endoscopy   Disposition: Skilled nursing facility  Diet recommendation: Renal diet   DISCHARGE MEDICATION: Allergies as of 01/02/2023   No Known Allergies      Medication List     STOP taking these medications    Eliquis 5 MG Tabs tablet Generic drug: apixaban   metoprolol tartrate 25 MG tablet Commonly known as: LOPRESSOR       TAKE these medications    acetaminophen 325 MG tablet Commonly known as: TYLENOL Take 2 tablets (650 mg total) by mouth every 6 (six) hours as needed for mild pain (or Fever >/= 101).   amiodarone 200 MG tablet Commonly known as: PACERONE Take 1 tablet (200 mg total) by mouth 2 (two) times daily for 4 days, THEN 1 tablet (200 mg total) daily. Start taking on: January 02, 2023   atorvastatin 40 MG tablet Commonly known as: LIPITOR Take 40 mg by mouth at bedtime.   b complex-vitamin c-folic acid 0.8 MG Tabs tablet Take 1 tablet by mouth at bedtime.   calcitRIOL 0.25 MCG capsule Commonly known as: ROCALTROL Take 3 capsules (0.75 mcg total) by mouth every Monday, Wednesday, and Friday with hemodialysis. What changed:  how much to take when to take this   cinacalcet 30 MG tablet Commonly known as: SENSIPAR Take 30 mg by mouth daily.   midodrine 10 MG tablet Commonly known as: PROAMATINE Take 2 tablets (20 mg total) by mouth every 8  (eight) hours. What changed:  medication strength how much to take when to take this additional instructions   pantoprazole 40 MG tablet Commonly known as: Protonix Take 1 tablet (40 mg total) by mouth 2 (two) times daily before a meal.   Pataday 0.2 % Soln Generic drug: Olopatadine HCl Place 1 drop into both eyes daily.   pregabalin 75 MG capsule Commonly known as: LYRICA Take 1 capsule (75 mg total) by mouth at bedtime.   sevelamer carbonate 800 MG tablet Commonly known as: RENVELA Take 800 mg by mouth 2 (two) times daily.   traZODone 50 MG tablet Commonly known as: DESYREL Take 1 tablet (50 mg total) by mouth at bedtime as needed for sleep.               Discharge Care Instructions  (From admission, onward)           Start     Ordered   01/02/23 0000  Discharge wound care:       Comments: Paint bilateral heel wounds with betadine daily, all to air dry. Offload at all times with Prevalon boots   01/02/23 1413            Follow-up Information     [facility] Follow up.   Why: Someone will call you to scheule first home visit. Contact information: [address] [place] [address]  [phone]        Justin [name], [place]. Schedule an appointment as soon as possible for a visit in 1 week(s).  Specialty: Family Medicine Why: For hospital follow-up Contact information: [address] [place] [address] [phone]                Discharge Exam: BP 102/79 (BP Location: Right Arm)   Pulse (!) 113   Temp 97.9 F (36.6 C) (Oral)   Resp 18   Ht 5\' 10"  (1.778 m)   Wt 87 kg   SpO2 95%   BMI 27.52 kg/m   General exam: Appears calm and comfortable. Respiratory system: Clear to auscultation. Respiratory effort normal. Cardiovascular system: S1 & S2 heard, RRR. Systolic murmur. Gastrointestinal system: Abdomen is nondistended, soft and nontender. Normal bowel sounds heard. Central nervous system: Alert and  oriented. Musculoskeletal: No edema. No calf tenderness Skin: No cyanosis. No rashes Psychiatry: Judgement and insight appear normal. Mood & affect appropriate.   Condition at discharge: stable  The results of significant diagnostics from this hospitalization (including imaging, microbiology, ancillary and laboratory) are listed below for reference.   Imaging Studies: CT ANGIO GI BLEED  Addendum Date: 12/17/2022   ADDENDUM REPORT: 12/17/2022 16:19 ADDENDUM: This CTA exam was reviewed after endoscopy demonstrated a gastric lesion. There is a subtle hypodense area in the proximal stomach concerning for intramural or submucosal mass. This structure roughly measures 3.4 cm on image 31/11. In retrospect, this lesion may have been present on CT dating back to 01/28/2020. Findings concerning for a neoplastic process such as a GI stromal tumor. Electronically Signed   By: Richarda Overlie M.D.   On: 12/17/2022 16:19   Result Date: 12/17/2022 CLINICAL DATA:  77 year old with rectal bleeding. EXAM: CTA ABDOMEN AND PELVIS WITHOUT AND WITH CONTRAST TECHNIQUE: Multidetector CT imaging of the abdomen and pelvis was performed using the standard protocol during bolus administration of intravenous contrast. Multiplanar reconstructed images and MIPs were obtained and reviewed to evaluate the vascular anatomy. RADIATION DOSE REDUCTION: This exam was performed according to the departmental dose-optimization program which includes automated exposure control, adjustment of the mA and/or kV according to patient size and/or use of iterative reconstruction technique. CONTRAST:  OMNIPAQUE IOHEXOL 350 MG/ML SOLN COMPARISON:  09/10/2022 FINDINGS: VASCULAR Aorta: Atherosclerotic disease in the abdominal aorta without aneurysm, dissection or significant stenosis. Celiac: Patent without evidence of aneurysm, dissection, vasculitis or significant stenosis. SMA: Patent without evidence of aneurysm, dissection, vasculitis or significant  stenosis. Renals: Atherosclerotic disease involving bilateral renal arteries. There is at least mild stenosis involving the right renal artery but limited evaluation. No evidence for aneurysm or dissections involving the renal arteries. IMA: Patent Inflow: Atherosclerotic disease involving the bilateral iliac arteries without aneurysm, dissection or significant stenosis. Proximal Outflow: Proximal femoral arteries are patent bilaterally. Veins: Contrast in the IVC, right hepatic vein and renal veins related to the injection being performed through the right femoral central line. There is a right common femoral central line with the tip in the right external iliac vein. There also appears to be contrast in the right internal iliac veins. Review of the MIP images confirms the above findings. NON-VASCULAR Lower chest: Trace right pleural fluid and small left pleural effusion. Compressive atelectasis at both lung bases. Coronary arteries are heavily calcified. Compressive atelectasis at both lung bases. Hepatobiliary: Cholecystostomy tube is positioned in the gallbladder. Gallbladder is decompressed. Mild intrahepatic biliary dilatation. No significant extrahepatic biliary dilatation. Pancreas: Unremarkable. No pancreatic ductal dilatation or surrounding inflammatory changes. Spleen: Normal in size without focal abnormality. Adrenals/Urinary Tract: Adrenal glands are within normal limits. Chronic atrophy involving both kidneys with mild perinephric  edema. 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 No hydronephrosis. Urinary bladder is decompressed. Stomach/Bowel: Probable small diverticulum along the posterior aspect of the gastric fundus. No evidence for active GI bleeding. Normal appendix. Small metallic clip in the colon near the hepatic flexure on image 49/11. Moderate amount of stool in the colon. Mild distention of the rectum  containing high-density material which could be related to blood products. Perirectal and presacral edema. No evidence for acute bowel inflammation or obstruction. Lymphatic: No lymph node enlargement in the abdomen or pelvis. Reproductive: Prostate is enlarged with fiducial markers around and involving the prostate. Other: Mild subcutaneous edema. Presacral and perirectal edema without free fluid. Left inguinal hernia containing fat. Negative for free air. Musculoskeletal: Diffuse sclerosis in the bones compatible with end-stage renal disease. Bilateral pars defects at L5 without significant anterolisthesis of L5 on S1. Irregularity of the endplates at L3-L4 is similar to the recent comparison examination. Findings could represent extensive degenerative changes with Schmorl's nodes. Multilevel degenerative facet disease most prominent at L3-L4. IMPRESSION: 1. Mild distention of the rectum containing high-density material which could be related to blood products. No evidence for active GI bleeding. 2. Cholecystostomy tube is positioned in the gallbladder. Gallbladder is decompressed. 3. Small left pleural effusion with compressive atelectasis at both lung bases. 4. Mild subcutaneous edema with presacral and perirectal edema which could be related to fluid overload. 5. Diffuse sclerosis in the bones compatible with end-stage renal disease. 6. Aortic Atherosclerosis (ICD10-I70.0). Electronically Signed: By: Richarda Overlie M.D. On: 12/14/2022 08:45    Microbiology: Results for orders placed or performed during the hospital encounter of 12/13/22  MRSA Next Gen by PCR, Nasal     Status: None   Collection Time: 12/14/22  7:00 AM   Specimen: Nasal Mucosa; Nasal Swab  Result Value Ref Range Status   MRSA by PCR Next Gen NOT DETECTED NOT DETECTED Final    Comment: (NOTE) The GeneXpert MRSA Assay (FDA approved for NASAL specimens only), is one component of a comprehensive MRSA colonization surveillance program. It is  not intended to diagnose MRSA infection nor to guide or monitor treatment for MRSA infections. Test performance is not FDA approved in patients less than 44 years old. Performed at [facility], [address] [place], [place] [address]     Labs: CBC: Recent Labs  Lab 12/28/22 0450 12/30/22 1628 01/01/23 1004  WBC 6.5 6.3 5.2  NEUTROABS 3.8 3.4  --   HGB 9.2* 9.3* 8.7*  HCT 29.5* 27.8* 27.3*  MCV 94.9 95.9 96.1  PLT 221 218 201   Basic Metabolic Panel: Recent Labs  Lab 12/27/22 0421 12/28/22 0450 12/30/22 1628 01/01/23 0914  NA 136 136 139 136  K 3.8 3.8 4.1 3.7  CL 97* 97* 102 101  CO2 29 27 28 26   GLUCOSE 93 78 77 116*  BUN 8 15 15 11   CREATININE 5.38* 6.72* 6.27* 5.29*  CALCIUM 8.0* 7.9* 7.9* 8.0*  MG 1.5* 1.6*  --   --   PHOS 3.4 3.6 2.8 2.6   Liver Function Tests: Recent Labs  Lab 12/27/22 0421 12/28/22 0450 12/30/22 1628 01/01/23 0914  AST 16 17  --   --   ALT 10 11  --   --   ALKPHOS 44 50  --   --   BILITOT 0.8 1.0  --   --   PROT 6.2* 5.9*  --   --  ALBUMIN 2.8* 2.6* 2.4* 2.8*    Discharge time spent: 35 minutes.  Signed: Jacquelin Hawking, MD [facility] 01/02/2023

## 2023-01-02 NOTE — Progress Notes (Signed)
PROGRESS NOTE    Justin [name]  ZOX:[id number] DOB: [birth year] DOA: 12/13/2022 PCP: [name], DO   Brief Narrative: Justin [name] is a 77 y.o. male with a history of atrial fibrillation on Eliquis, rectal AVM with recurrent GI bleeding, ESRD on hemodialysis, anemia, chronic hypotension, moderate aortic stenosis.  Patient presented secondary to rectal bleeding with associated worsening hypotension.  Patient required initial transfusion of 3 units of PRBC in addition to [facility] and was started on vasopressor support to manage hypotension from hemorrhagic shock.  Patient admitted to ICU on admission.  While in ICU, patient was evaluated by gastroenterology. 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  Vasopressors weaned off and patient transferred to the hospitalist service on 6/10. On 6/11, patient developed recurrent severe hypotension requiring Levophed and transfer back to ICU; unknown etiology. Patient transferred back again to the hospitalist service on 6/12.   Assessment and Plan:  Acute lower GI bleed Present on admission.  Complicated by Eliquis use.  [facility] was consulted and performed upper endoscopy and colonoscopy. 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 GI bleeding appears to be resolved.  Hemorrhagic shock Secondary to GI bleed and complicated by Eliquis use and underlying baseline hypotension. Patient required ICU admission 6/3 for vasopressor support. Patient managed on Levophed which was successfully weaned off on 6/7 with management of GI bleeding. Resolved. Hemoglobin stable.  Acute on chronic anemia Baseline  creatinine appears to be around 8. Acute anemia secondary to acute hemorrhage. Patient has received 3 units of PRBC while admitted. Hemoglobin stable.  Hypotension Chronic. Patient is managed on midodrine as an outpatient. Complicated by acute GI bleeding and need for vasopressor support this admission. Home midodrine dose increased. -Continue midodrine  Atrial flutter/atrial fibrillation, unspecified Chronic issue. Patient is not on antiarrhythmia or rate control medication. He is managed on Eliquis as an outpatient which is now discontinued secondary to high risk of bleed with significant GI bleeding. Patient developed RVR this admission and was started on amiodarone per prior discussion with cardiology, Dr. Anne Fu. 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  Aortic stenosis Moderate stenosis noted on most recent Transthoracic Echocardiogram from 5/14.   ESRD on HD Nephrology consulted. Patient is receiving hemodialysis while inpatient.  History of acute cholecystitis Recent. S/p percutaneous cholecystostomy tube placed in April of 2024 and still present.  Diabetes mellitus type 2 Controlled. Hemoglobin A1C of 5.1%. patient is not on medication management.  Diabetic neuropathy -Continue Lyrica 75 mg daily  GERD -Continue Protonix  Pressure injury Left posterior heel, right heel. Present on admission.    DVT prophylaxis: SCDs Code Status:   Code Status: DNR Family Communication: None at bedside Disposition Plan: Medically stable for discharge. Discharge likely to SNF pending patient's ability to consistently work with physical/occupational therapy.   Consultants:  PCCM [facility] Nephrology  Procedures:  6/6:  Colonoscopy; Upper GI endoscopy  Antimicrobials: None    Subjective: Patient feeling well today. Sitting up at the edge of the  bed.  Objective: BP 102/79 (BP Location: Right Arm)   Pulse (!) 113   Temp 97.9 F (36.6 C) (Oral)   Resp 18   Ht 5\' 10"  (1.778 m)   Wt 87 kg   SpO2 95%   BMI 27.52 kg/m   Examination:  General exam: Appears calm and comfortable Respiratory system: Clear to auscultation. Respiratory effort normal. Cardiovascular system: S1 & S2 heard, RRR. Systolic murmur. Gastrointestinal system: Abdomen is nondistended, soft and nontender. Normal bowel sounds heard. Central nervous system: Alert and oriented.  Musculoskeletal: No edema. No calf tenderness Psychiatry: Judgement and insight appear normal. Mood & affect appropriate.    Data Reviewed: I have personally reviewed following labs and imaging studies  CBC Lab Results  Component Value Date   WBC 5.2 01/01/2023   RBC 2.84 (L) 01/01/2023   HGB 8.7 (L) 01/01/2023   HCT 27.3 (L) 01/01/2023   MCV 96.1 01/01/2023   MCH 30.6 01/01/2023   PLT 201 01/01/2023   MCHC 31.9 01/01/2023   RDW 15.7 (H) 01/01/2023   LYMPHSABS 2.0 12/30/2022   MONOABS 0.6 12/30/2022   EOSABS 0.2 12/30/2022   BASOSABS 0.1 12/30/2022     Last metabolic panel Lab Results  Component Value Date   NA 136 01/01/2023   K 3.7 01/01/2023   CL 101 01/01/2023   CO2 26 01/01/2023   BUN 11 01/01/2023   CREATININE 5.29 (H) 01/01/2023   GLUCOSE 116 (H) 01/01/2023   GFRNONAA 10 (L) 01/01/2023   GFRAA 8 (L) 02/01/2020   CALCIUM 8.0 (L) 01/01/2023   PHOS 2.6 01/01/2023   PROT 5.9 (L) 12/28/2022   ALBUMIN 2.8 (L) 01/01/2023   LABGLOB 3.7 10/01/2015   AGRATIO 0.9 10/01/2015   BILITOT 1.0 12/28/2022   ALKPHOS 50 12/28/2022   AST 17 12/28/2022   ALT 11 12/28/2022   ANIONGAP 9 01/01/2023    GFR: Estimated Creatinine Clearance: 11.9 mL/min (A) (by C-G formula based on SCr of 5.29 mg/dL (H)).  No results found for this or any  previous visit (from the past 240 hour(s)).     Radiology Studies: No results found.    LOS: 19 days    Jacquelin Hawking, MD [facility] 01/02/2023, 10:13 AM   If 7PM-7AM, please contact night-coverage [email or web address]

## 2023-01-02 NOTE — TOC Transition Note (Signed)
Transition of Care [facility]) - CM/SW Discharge Note   Patient Details  Name: Justin [name] MRN: [id number] Date of Birth: 1946-02-23  Transition of [facility]) CM/SW Contact:  Deatra Robinson, [place] Phone Number: 01/02/2023, 2:39 PM   Clinical Narrative: Pt for dc back to [facility] today. Pt aware of dc and reports agreeable. Updated pt's niece [name] and she plans to meet pt at [facility] today. Confirmed with Whitney at [facility] they are prepared to admit pt to room 114A. RN provided with number for report and PTAR arranged for transport. SW signing off at dc.   Dellie Burns, MSW, LCSW [phone] (coverage)        Final next level of care: Skilled Nursing Facility Barriers to Discharge: No Barriers Identified   Patient Goals and CMS Choice CMS Medicare.gov Compare Post Acute Care list provided to:: Patient Choice offered to / list presented to : Patient  Discharge Placement                Patient chooses bed at: Other - please specify in the comment section below: [facility]) Patient to be transferred to facility by: PTAR Name of family member notified: Michelle/niece Patient and family notified of of transfer: 01/02/23  Discharge Plan and Services Additional resources added to the After Visit Summary for   In-house Referral: Clinical Social Work                                   Social Determinants of Health (SDOH) Interventions SDOH Screenings   Food Insecurity: No Food Insecurity (11/24/2022)  Housing: Low Risk  (11/24/2022)  Transportation Needs: No Transportation Needs (11/24/2022)  Utilities: Not At Risk (11/24/2022)  Tobacco Use: Medium Risk (12/20/2022)     Readmission Risk Interventions    11/14/2022   10:08 AM 09/18/2022    4:10 PM 06/17/2022    2:47 PM  Readmission Risk Prevention Plan  Transportation Screening Complete Complete Complete  HRI or Home Care Consult   Complete  Social Work Consult for Recovery Care  Planning/Counseling   Complete  Palliative Care Screening   Not Applicable  Medication Review [employer]) Complete Complete Complete  PCP or Specialist appointment within 3-5 days of discharge Complete Complete   HRI or Home Care Consult Complete Complete   SW Recovery Care/Counseling Consult Complete    Palliative Care Screening Not Applicable Complete   Skilled Nursing Facility Complete Not Applicable

## 2023-01-02 NOTE — Progress Notes (Addendum)
[facility] Progress Note   Subjective:   Patient seen and examined at bedside. Tolerated treatment well yesterday.  Denies CP, SOB, abdominal pain and n/v/d.  No specific complaints.   Objective Vitals:   01/01/23 1527 01/01/23 2006 01/02/23 0453 01/02/23 0845  BP: 129/84 110/87 110/77 102/79  Pulse: (!) 111 (!) 110 (!) 110 (!) 113  Resp: 17  18 18   Temp: 98.2 F (36.8 C) 98 F (36.7 C) 97.9 F (36.6 C)   TempSrc: Oral Oral Oral   SpO2: 100% 100% 100% 95%  Weight:      Height:       Physical Exam General:well appearing male in NAD Heart:RRR, +murmur Lungs:CTAB, nml WOB on RA Abdomen:soft, NTND Extremities:no LE edema Dialysis Access: LU AVF +b/t   Filed Weights   12/28/22 2008 12/29/22 0719 12/30/22 1351  Weight: 87.5 kg 88 kg 87 kg    Intake/Output Summary (Last 24 hours) at 01/02/2023 1046 Last data filed at 01/02/2023 0600 Gross per 24 hour  Intake 600 ml  Output 300 ml  Net 300 ml    Additional Objective Labs: Basic Metabolic Panel: Recent Labs  Lab 12/28/22 0450 12/30/22 1628 01/01/23 0914  NA 136 139 136  K 3.8 4.1 3.7  CL 97* 102 101  CO2 27 28 26   GLUCOSE 78 77 116*  BUN 15 15 11   CREATININE 6.72* 6.27* 5.29*  CALCIUM 7.9* 7.9* 8.0*  PHOS 3.6 2.8 2.6   Liver Function Tests: Recent Labs  Lab 12/27/22 0421 12/28/22 0450 12/30/22 1628 01/01/23 0914  AST 16 17  --   --   ALT 10 11  --   --   ALKPHOS 44 50  --   --   BILITOT 0.8 1.0  --   --   PROT 6.2* 5.9*  --   --   ALBUMIN 2.8* 2.6* 2.4* 2.8*   CBC: Recent Labs  Lab 12/28/22 0450 12/30/22 1628 01/01/23 1004  WBC 6.5 6.3 5.2  NEUTROABS 3.8 3.4  --   HGB 9.2* 9.3* 8.7*  HCT 29.5* 27.8* 27.3*  MCV 94.9 95.9 96.1  PLT 221 218 201   Blood Culture    Component Value Date/Time   SDES ABSCESS 06/15/2022 1344   SPECREQUEST PERIRECTAL 06/15/2022 1344   CULT  06/15/2022 1344    FEW ESCHERICHIA COLI NO ANAEROBES ISOLATED Performed at [facility], [address] [place], [place] [address]    REPTSTATUS 06/20/2022 FINAL 06/15/2022 1344    Medications:  sodium chloride Stopped (12/24/22 0322)    sodium chloride   Intravenous Once   amiodarone  200 mg Oral BID   Followed by   Melene Muller ON 01/06/2023] amiodarone  200 mg Oral Daily   calcitRIOL  0.75 mcg Oral Q M,W,F   Chlorhexidine Gluconate Cloth  6 each Topical Q0600   cinacalcet  30 mg Oral Q breakfast   darbepoetin (ARANESP) injection - DIALYSIS  200 mcg Subcutaneous Q Mon-1800   midodrine  20 mg Oral Q8H   pantoprazole  40 mg Oral BID   pregabalin  75 mg Oral Daily    Dialysis Orders: [facility] M[facility] 3h  450/600  91.6kg  2/2.5 bath  LUA AVF  Hep none - coming in under dry wt around 89kg last 2 sessions - rocaltrol 0.75 mcg po tiw - venofer 50 iv weekly - mircera 200 mcg IV q 2 wks, last 5/27, due 6/10 - last Hb 9.6 on 5/29   Assessment/Plan: Acute  on chronic hypotension- in shock initially related to acute GIB and rx'd in ICU from 6/02- 6/10. Pt moved to floor. On 6/11 BP's had dropped again and pt moved back to ICU briefly. Unclear cause, BP and pt improved and was again moved out of ICU. Cont po midodrine 20 tid.  GI bleed - S/P 3 units PRBC's. CT angio was negative for active bleed. Eliquis was held. Had recurrent rectal bleeding/ hematochezia on 12/15/22, seen by GI and had EGD/ colonoscopy on 6/07.  EGD was negative, colonoscopy with rectal ulcer s/p clip.  Hgb stabilized in the 7s and now is in the 8-9 range. Transfuse prn.    ESRD - usual HD MWF. Next HD on 6/24.   Volume  -  was 7kg under dry wt w/ hypotension, improved w/ IVF"s and not pulling fluid w/ HD. Pt remains euvolemic and wt's are not increasing, will cont to keep even w/ HD. Dry weight will need to be lowered ~ 88kg.  Anemia esrd - +ABLA also as above.  S/p blood transfusions x 3.  Last OP esa was given 5/27. Getting darbe 200 mcg sq here on Mondays.   Metabolic bone disease -  CCa and phos are in range. Continues his  home meds  here w/ rocaltrol and sensipar.   A fib/flutter/RVR - eliquis on hold at this time as risks outweigh benefits. Amiodarone started here.     Acute cholecystitis s/p percutaneous cholecystostomy tube - from OSH 4/2- 4/23 and felt to be too high risk for surgical intervention.  Drain exchanged 12/01/22 and pt is to f/u with surgery at [facility] in July.  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  Currently DNR.  Dispo - appears for SNF placement   Virgina Norfolk, PA-C [facility] 01/02/2023,10:46 AM  LOS: 19 days

## 2023-01-04 ENCOUNTER — Encounter (INDEPENDENT_AMBULATORY_CARE_PROVIDER_SITE_OTHER): Payer: Medicare Other | Admitting: Ophthalmology

## 2023-01-04 NOTE — Progress Notes (Signed)
Late Note Entry  Pt was d/c on Saturday. Contacted [facility] this morning to advise clinic of pt's d/c date and that pt should resume care today.   Adiel Mcnamara Renal Navigator [phone] 

## 2023-01-21 ENCOUNTER — Ambulatory Visit: Payer: Medicare Other | Attending: Internal Medicine | Admitting: Internal Medicine

## 2023-01-21 ENCOUNTER — Encounter: Payer: Self-pay | Admitting: Internal Medicine

## 2023-01-21 NOTE — Progress Notes (Deleted)
Cardiology Office Note:    Date:  01/21/2023   ID:  Justin [name], DOB 10/15/1945, MRN [id number]  PCP:  [name], DO   [facility] Providers Cardiologist:  None { Click to update primary MD,subspecialty MD or APP then REFRESH:1}    Referring MD: [name], DO   No chief complaint on file. ***  History of Present Illness:    Justin [name] is a 77 y.o. 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) 12/07/2019   Dialysis patient (HCC)    Gout    Hypercalcemia 11/14/2021   Hyperkalemia 10/19/2018   Hypertension    Hypotension 06/13/2022   Right posterior capsular opacification 02/27/2021   Septic shock (HCC)    Vitreous hemorrhage of right eye (HCC) 10/24/2019    Past Surgical History:  Procedure Laterality Date   A/V FISTULAGRAM Left 08/17/2017   Procedure: A/V FISTULAGRAM;  Surgeon: Renford Dills, MD;  Location: [facility] INVASIVE CV LAB;  Service: Cardiovascular;  Laterality: Left;   A/V SHUNT INTERVENTION N/A 08/17/2017   Procedure: A/V SHUNT INTERVENTION;  Surgeon: Renford Dills, MD;  Location: [facility] INVASIVE CV LAB;  Service: Cardiovascular;  Laterality: N/A;   AV FISTULA PLACEMENT Left 05/31/2015   Procedure: ARTERIOVENOUS (AV) FISTULA CREATION;  Surgeon: Renford Dills, MD;  Location: [facility] ORS;  Service: Vascular;  Laterality: Left;   COLONOSCOPY N/A 12/17/2022   Procedure: COLONOSCOPY;  Surgeon: Napoleon Form, MD;  Location: [facility] ENDOSCOPY;  Service: Gastroenterology;  Laterality: N/A;   COLONOSCOPY WITH PROPOFOL N/A 09/11/2022   Procedure: COLONOSCOPY WITH PROPOFOL;  Surgeon: Imogene Burn, MD;  Location: [facility] ENDOSCOPY;  Service: Gastroenterology;  Laterality: N/A;   COLONOSCOPY WITH PROPOFOL N/A 09/17/2022   Procedure: COLONOSCOPY WITH PROPOFOL;  Surgeon: Benancio Deeds, MD;  Location: [facility] ENDOSCOPY;  Service: Gastroenterology;  Laterality: N/A;   ESOPHAGOGASTRODUODENOSCOPY (EGD) WITH PROPOFOL N/A 09/11/2022   Procedure: ESOPHAGOGASTRODUODENOSCOPY (EGD) WITH PROPOFOL;  Surgeon: Imogene Burn, MD;  Location: [facility] ENDOSCOPY;  Service: Gastroenterology;  Laterality: N/A;   ESOPHAGOGASTRODUODENOSCOPY (EGD) WITH PROPOFOL N/A 12/17/2022   Procedure: ESOPHAGOGASTRODUODENOSCOPY (EGD) WITH PROPOFOL;  Surgeon: Napoleon Form, MD;  Location: [facility] ENDOSCOPY;  Service: Gastroenterology;  Laterality: N/A;   excision bone spurs Bilateral 1989   feet   GIVENS CAPSULE STUDY N/A 11/10/2022   Procedure: GIVENS CAPSULE STUDY;  Surgeon: Shellia Cleverly, DO;  Location: [facility] ENDOSCOPY;  Service: Gastroenterology;  Laterality: N/A;   HEMOSTASIS CLIP PLACEMENT  09/11/2022   Procedure: HEMOSTASIS CLIP PLACEMENT;  Surgeon: Imogene Burn, MD;  Location: [facility] ENDOSCOPY;  Service: Gastroenterology;;   HEMOSTASIS CLIP PLACEMENT  09/17/2022   Procedure: HEMOSTASIS CLIP PLACEMENT;  Surgeon: Benancio Deeds, MD;  Location: [facility] ENDOSCOPY;  Service: Gastroenterology;;   HEMOSTASIS CLIP PLACEMENT  12/17/2022   Procedure: HEMOSTASIS CLIP PLACEMENT;  Surgeon: Napoleon Form, MD;  Location: [facility] ENDOSCOPY;  Service: Gastroenterology;;   HOT HEMOSTASIS N/A 09/11/2022  Procedure: HOT HEMOSTASIS (ARGON PLASMA COAGULATION/BICAP);  Surgeon: Imogene Burn, MD;  Location[facility] ENDOSCOPY;  Service: Gastroenterology;  Laterality: N/A;   HOT HEMOSTASIS N/A 09/17/2022   Procedure: HOT HEMOSTASIS (ARGON PLASMA COAGULATION/BICAP);  Surgeon:  Benancio Deeds, MD;  Location: [facility] ENDOSCOPY;  Service: Gastroenterology;  Laterality: N/A;   INCISION AND DRAINAGE PERIRECTAL ABSCESS N/A 06/15/2022   Procedure: IRRIGATION AND DEBRIDEMENT PERIRECTAL ABSCESS;  Surgeon: Franky Macho, MD;  Location: [facility] ORS;  Service: General;  Laterality: N/A;   KNEE SURGERY Left 1998   arthroscopy   POLYPECTOMY  09/11/2022   Procedure: POLYPECTOMY;  Surgeon: Imogene Burn, MD;  Location: [facility] ENDOSCOPY;  Service: Gastroenterology;;   SHOULDER SURGERY Left 1994   rotator cuff    Current Medications: No outpatient medications have been marked as taking for the 01/21/23 encounter (Appointment) with Maisie Fus, MD.     Allergies:   Patient has no known allergies.   Social History   Socioeconomic History   Marital status: Widowed    Spouse name: Not on file   Number of children: Not on file   Years of education: Not on file   Highest education level: Not on file  Occupational History   Not on file  Tobacco Use   Smoking status: Former    Current packs/day: 0.00    Types: Cigarettes    Quit date: 10/01/1978    Years since quitting: 44.3   Smokeless tobacco: Never  Vaping Use   Vaping status: Never Used  Substance and Sexual Activity   Alcohol use: No   Drug use: No   Sexual activity: Not on file  Other Topics Concern   Not on file  Social History Narrative   Not on file   Social Determinants of Health   Financial Resource Strain: Not on file  Food Insecurity: No Food Insecurity (11/24/2022)   Hunger Vital Sign    Worried About Running Out of Food in the Last Year: Never true    Ran Out of Food in the Last Year: Never true  Transportation Needs: No Transportation Needs (11/24/2022)   PRAPARE - Administrator, Civil Service (Medical): No    Lack of Transportation (Non-Medical): No  Physical Activity: Not on file  Stress: No Stress Concern Present (10/14/2022)   Received from [facility] of  Occupational Health - Occupational Stress Questionnaire    Feeling of Stress : Not at all  Social Connections: Unknown (10/13/2022)   [facility]   Social Network    Social Network: Not on file     Family History: The patient's ***family history includes Alcoholism in his brother and father; Kidney disease in his mother.  ROS:   Please see the history of present illness.    *** All other systems reviewed and are negative.  EKGs/Labs/Other Studies Reviewed:    The following studies were reviewed today: ***      Recent Labs: 11/09/2022: B Natriuretic Peptide 1,895.9; TSH 0.867 12/28/2022: ALT 11; Magnesium 1.6 01/01/2023: BUN 11; Creatinine, Ser 5.29; Hemoglobin 8.7; Platelets 201; Potassium 3.7; Sodium 136  Recent Lipid Panel    Component Value Date/Time   CHOL  09/22/2009 0618    104        ATP III CLASSIFICATION:  <200     mg/dL   Desirable  161-096  mg/dL   Borderline High  >=045    mg/dL   High  TRIG 119 09/22/2009 0618   HDL 29 (L) 09/22/2009 0618   CHOLHDL 3.6 09/22/2009 0618   VLDL 24 09/22/2009 0618   LDLCALC  09/22/2009 0618    51        Total Cholesterol/HDL:CHD Risk Coronary Heart Disease Risk Table                     Men   Women  1/2 Average Risk   3.4   3.3  Average Risk       5.0   4.4  2 X Average Risk   9.6   7.1  3 X Average Risk  23.4   11.0        Use the calculated Patient Ratio above and the CHD Risk Table to determine the patient's CHD Risk.        ATP III CLASSIFICATION (LDL):  <100     mg/dL   Optimal  324-401  mg/dL   Near or Above                    Optimal  130-159  mg/dL   Borderline  027-253  mg/dL   High  >664     mg/dL   Very High     Risk Assessment/Calculations:   {Does this patient have ATRIAL FIBRILLATION?[phone]}  No BP recorded.  {Refresh Note OR Click here to enter BP  :1}***         Physical Exam:    VS:  There were no vitals taken for this visit.    Wt Readings from Last 3  Encounters:  12/30/22 191 lb 12.8 oz (87 kg)  11/24/22 15 lb 6.9 oz (7 kg)  09/25/22 211 lb 10.3 oz (96 kg)     GEN: *** Well nourished, well developed in no acute distress HEENT: Normal NECK: No JVD; No carotid bruits LYMPHATICS: No lymphadenopathy CARDIAC: ***RRR, no murmurs, rubs, gallops RESPIRATORY:  Clear to auscultation without rales, wheezing or rhonchi  ABDOMEN: Soft, non-tender, non-distended MUSCULOSKELETAL:  No edema; No deformity  SKIN: Warm and dry NEUROLOGIC:  Alert and oriented x 3 PSYCHIATRIC:  Normal affect   ASSESSMENT:    Chronic Afib - rate controlled off anti-hypertensives - does not tolerate anti-hypertensives with low BP on ESRD - no AC with hx of hemorrhagic shock  GI Bleed/Hemorrhagic Shock: rectal erosion, 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  Moderate AS: continue surveillance PLAN:    In order of problems listed above:  No changes Follow up in 1 year      {Are you ordering a CV Procedure (e.g. stress test, cath, DCCV, TEE, etc)?   Press F2        :[id number]}    Medication Adjustments/Labs and Tests Ordered: Current medicines are reviewed at length with the patient today.  Concerns regarding medicines are outlined above.  No orders of the defined types were placed in this encounter.  No orders of the defined types were placed in this encounter.   There are no Patient Instructions on file for this visit.   Signed, Maisie Fus, MD  01/21/2023 11:25 AM    [facility]

## 2023-03-01 ENCOUNTER — Encounter: Payer: Self-pay | Admitting: Internal Medicine

## 2023-03-01 ENCOUNTER — Ambulatory Visit (INDEPENDENT_AMBULATORY_CARE_PROVIDER_SITE_OTHER): Payer: Medicare Other | Admitting: Internal Medicine

## 2023-03-01 ENCOUNTER — Other Ambulatory Visit (INDEPENDENT_AMBULATORY_CARE_PROVIDER_SITE_OTHER): Payer: Medicare Other

## 2023-03-01 VITALS — BP 124/70 | HR 80 | Ht 70.0 in

## 2023-03-01 DIAGNOSIS — Z8719 Personal history of other diseases of the digestive system: Secondary | ICD-10-CM

## 2023-03-01 DIAGNOSIS — R197 Diarrhea, unspecified: Secondary | ICD-10-CM

## 2023-03-01 DIAGNOSIS — K3189 Other diseases of stomach and duodenum: Secondary | ICD-10-CM

## 2023-03-01 DIAGNOSIS — K635 Polyp of colon: Secondary | ICD-10-CM

## 2023-03-01 LAB — CBC
HCT: 37.3 % — ABNORMAL LOW (ref 39.0–52.0)
Hemoglobin: 11.7 g/dL — ABNORMAL LOW (ref 13.0–17.0)
MCHC: 31.5 g/dL (ref 30.0–36.0)
MCV: 98.5 fl (ref 78.0–100.0)
Platelets: 133 10*3/uL — ABNORMAL LOW (ref 150.0–400.0)
RBC: 3.78 Mil/uL — ABNORMAL LOW (ref 4.22–5.81)
RDW: 22.3 % — ABNORMAL HIGH (ref 11.5–15.5)
WBC: 7.2 10*3/uL (ref 4.0–10.5)

## 2023-03-01 NOTE — Patient Instructions (Addendum)
Your provider has requested that you go to the basement level for lab work before leaving today. Press "B" on the elevator. The lab is located at the first door on the left as you exit the elevator.   Please purchase the following medications over the counter and take as directed: Imodium 2 mg as needed for  diarrhea   Interventional Radiology will contact you to schedule appointment for Percutaneous Cholecystostomy tube exchange.  A referral was placed for [facility] they will contact you to schedule appointment  If your blood pressure at your visit was 140/90 or greater, please contact your primary care physician to follow up on this.  _______________________________________________________  If you are age 47 or older, your body mass index should be between 23-30. Your Body mass index is 27.52 kg/m. If this is out of the aforementioned range listed, please consider follow up with your Primary Care Provider.  If you are age 51 or younger, your body mass index should be between 19-25. Your Body mass index is 27.52 kg/m. If this is out of the aformentioned range listed, please consider follow up with your Primary Care Provider.   ________________________________________________________  The [facility] providers would like to encourage you to use [facility] to communicate with providers for non-urgent requests or questions.  Due to long hold times on the telephone, sending your provider a message by [facility] may be a faster and more efficient way to get a response.  Please allow 48 business hours for a response.  Please remember that this is for non-urgent requests.  _______________________________________________________  Due to recent changes in healthcare laws, you may see the results of your imaging and laboratory studies on MyChart before your provider has had a chance to review them.  We understand that in some cases there may be results that are confusing or concerning to you.  Not all laboratory results come back in the same time frame and the provider may be waiting for multiple results in order to interpret others.  Please give [place] 48 hours in order for your provider to thoroughly review all the results before contacting the office for clarification of your results.   Thank you for entrusting me with your care and for choosing [facility], Dr. Eulah Pont

## 2023-03-01 NOTE — Progress Notes (Unsigned)
Chief Complaint: Anemia, gastric nodule, colon polyp  HPI : 77 year old male with history of A-fib/A-flutter on Eliquis, ESRD on HD, DM, prostate cancer s/p radiation presented with anemia  Interval History: He still has his PCT in place. Every 2 months he has the PCT removed in the [place]. Denies any blood in his stools. He is no longer on any Eliuqis therapy. Denies N&V, ab pain, or constipation. He has bad diarrhea. Pepto Bismol does help at times. His appetite is back. He has lost about 50 lbs since 06/2022.  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) 12/07/2019   Dialysis patient (HCC)    Gout    Hypercalcemia 11/14/2021   Hyperkalemia 10/19/2018   Hypertension    Hypotension 06/13/2022   Right posterior capsular opacification 02/27/2021   Septic shock (HCC)    Vitreous hemorrhage of right eye (HCC) 10/24/2019     Past Surgical History:  Procedure Laterality Date   A/V FISTULAGRAM Left 08/17/2017   Procedure: A/V FISTULAGRAM;  Surgeon: Renford Dills, MD;  Location: [facility] INVASIVE CV LAB;  Service: Cardiovascular;  Laterality: Left;   A/V SHUNT INTERVENTION N/A 08/17/2017   Procedure: A/V SHUNT INTERVENTION;  Surgeon: Renford Dills, MD;  Location: [facility] INVASIVE CV LAB;  Service: Cardiovascular;  Laterality: N/A;   AV FISTULA PLACEMENT Left 05/31/2015   Procedure: ARTERIOVENOUS (AV) FISTULA CREATION;  Surgeon: Renford Dills, MD;   Location: [facility] ORS;  Service: Vascular;  Laterality: Left;   COLONOSCOPY N/A 12/17/2022   Procedure: COLONOSCOPY;  Surgeon: Napoleon Form, MD;  Location: [facility] ENDOSCOPY;  Service: Gastroenterology;  Laterality: N/A;   COLONOSCOPY WITH PROPOFOL N/A 09/11/2022   Procedure: COLONOSCOPY WITH PROPOFOL;  Surgeon: Imogene Burn, MD;  Location: [facility] ENDOSCOPY;  Service: Gastroenterology;  Laterality: N/A;   COLONOSCOPY WITH PROPOFOL N/A 09/17/2022   Procedure: COLONOSCOPY WITH PROPOFOL;  Surgeon: Benancio Deeds, MD;  Location: [facility] ENDOSCOPY;  Service: Gastroenterology;  Laterality: N/A;   ESOPHAGOGASTRODUODENOSCOPY (EGD) WITH PROPOFOL N/A 09/11/2022   Procedure: ESOPHAGOGASTRODUODENOSCOPY (EGD) WITH PROPOFOL;  Surgeon: Imogene Burn, MD;  Location: [facility] ENDOSCOPY;  Service: Gastroenterology;  Laterality: N/A;   ESOPHAGOGASTRODUODENOSCOPY (EGD) WITH PROPOFOL N/A 12/17/2022   Procedure: ESOPHAGOGASTRODUODENOSCOPY (EGD) WITH PROPOFOL;  Surgeon: Napoleon Form, MD;  Location: [facility] ENDOSCOPY;  Service: Gastroenterology;  Laterality: N/A;   excision bone spurs Bilateral 1989   feet   GIVENS CAPSULE STUDY N/A 11/10/2022   Procedure: GIVENS CAPSULE STUDY;  Surgeon: Shellia Cleverly, DO;  Location: [facility] ENDOSCOPY;  Service: Gastroenterology;  Laterality: N/A;   HEMOSTASIS CLIP PLACEMENT  09/11/2022   Procedure: HEMOSTASIS CLIP PLACEMENT;  Surgeon: Imogene Burn, MD;  Location: [facility] ENDOSCOPY;  Service: Gastroenterology;;   HEMOSTASIS CLIP PLACEMENT  09/17/2022   Procedure: HEMOSTASIS CLIP PLACEMENT;  Surgeon: Benancio Deeds, MD;  Location: [facility] ENDOSCOPY;  Service: Gastroenterology;;   HEMOSTASIS CLIP PLACEMENT  12/17/2022  Procedure: HEMOSTASIS CLIP PLACEMENT;  Surgeon: Napoleon Form, MD;  Location: [facility] ENDOSCOPY;  Service: Gastroenterology;;   HOT HEMOSTASIS N/A 09/11/2022   Procedure: HOT HEMOSTASIS (ARGON PLASMA COAGULATION/BICAP);  Surgeon: Imogene Burn, MD;  Location: [facility] ENDOSCOPY;  Service:  Gastroenterology;  Laterality: N/A;   HOT HEMOSTASIS N/A 09/17/2022   Procedure: HOT HEMOSTASIS (ARGON PLASMA COAGULATION/BICAP);  Surgeon: Benancio Deeds, MD;  Location: [facility] ENDOSCOPY;  Service: Gastroenterology;  Laterality: N/A;   INCISION AND DRAINAGE PERIRECTAL ABSCESS N/A 06/15/2022   Procedure: IRRIGATION AND DEBRIDEMENT PERIRECTAL ABSCESS;  Surgeon: Franky Macho, MD;  Location: [facility] ORS;  Service: General;  Laterality: N/A;   KNEE SURGERY Left 1998   arthroscopy   POLYPECTOMY  09/11/2022   Procedure: POLYPECTOMY;  Surgeon: Imogene Burn, MD;  Location: [facility] ENDOSCOPY;  Service: Gastroenterology;;   SHOULDER SURGERY Left 1994   rotator cuff   Family History  Problem Relation Age of Onset   Kidney disease Mother    Alcoholism Father    Alcoholism Brother    Colon cancer Neg Hx    Stomach cancer Neg Hx    Esophageal cancer Neg Hx    Social History   Tobacco Use   Smoking status: Former    Current packs/day: 0.00    Types: Cigarettes    Quit date: 10/01/1978    Years since quitting: 44.4   Smokeless tobacco: Never  Vaping Use   Vaping status: Never Used  Substance Use Topics   Alcohol use: No   Drug use: No   Current Outpatient Medications  Medication Sig Dispense Refill   acetaminophen (TYLENOL) 325 MG tablet Take 2 tablets (650 mg total) by mouth every 6 (six) hours as needed for mild pain (or Fever >/= 101).     atorvastatin (LIPITOR) 40 MG tablet Take 40 mg by mouth at bedtime.     b complex-vitamin c-folic acid (NEPHRO-VITE) 0.8 MG TABS tablet Take 1 tablet by mouth at bedtime.     calcitRIOL (ROCALTROL) 0.25 MCG capsule Take 3 capsules (0.75 mcg total) by mouth every Monday, Wednesday, and Friday with hemodialysis. (Patient taking differently: Take 0.25 mcg by mouth daily.)     cinacalcet (SENSIPAR) 30 MG tablet Take 30 mg by mouth daily.     midodrine (PROAMATINE) 10 MG tablet Take 2 tablets (20 mg total) by mouth every 8 (eight) hours.     Olopatadine HCl  (PATADAY) 0.2 % SOLN Place 1 drop into both eyes daily.     pantoprazole (PROTONIX) 40 MG tablet Take 1 tablet (40 mg total) by mouth 2 (two) times daily before a meal.     pregabalin (LYRICA) 75 MG capsule Take 1 capsule (75 mg total) by mouth at bedtime. (Patient taking differently: Take 75 mg by mouth at bedtime. Pt taking twice a day) 5 capsule 0   sevelamer carbonate (RENVELA) 800 MG tablet Take 800 mg by mouth 2 (two) times daily.     traZODone (DESYREL) 50 MG tablet Take 1 tablet (50 mg total) by mouth at bedtime as needed for sleep.     amiodarone (PACERONE) 200 MG tablet Take 1 tablet (200 mg total) by mouth 2 (two) times daily for 4 days, THEN 1 tablet (200 mg total) daily.     No current facility-administered medications for this visit.   No Known Allergies   Review of Systems: All systems reviewed and negative except where noted in HPI.   Physical Exam: BP 124/70   Pulse 80   Ht  5\' 10"  (1.778 m)   BMI 27.52 kg/m  Constitutional: Pleasant,well-developed, ***male in no acute distress. HEENT: Normocephalic and atraumatic. Conjunctivae are normal. No scleral icterus. Cardiovascular: Normal rate, regular rhythm.  Pulmonary/chest: Effort normal and breath sounds normal. No wheezing, rales or rhonchi. Abdominal: Soft, nondistended, nontender. Bowel sounds active throughout. There are no masses palpable. No hepatomegaly. Extremities: No edema Neurological: Alert and oriented to person place and time. Skin: Skin is warm and dry. No rashes noted. Psychiatric: Normal mood and affect. Behavior is normal.  Labs 12/2022: CBC with low Hb of 8.7. BMP with elevated Cr consistent with ESRD.  CTA Gi bleed 09/10/22: IMPRESSION: VASCULAR 1. Dense atheromatous changes noted for the aorta and its branches without evidence of significant occlusive changes, aneurysm or dissection. NON-VASCULAR 1. No evidence of extravasation of IV contrast to indicate the presence of active GI bleed. 2.  Sequelae of end-stage renal disease. 3. Distended gallbladder with evidence of stones or sludge. 4. 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 5. Bilateral gynecomastia.  CTA GI bleed 12/14/22: IMPRESSION: 1. Mild distention of the rectum containing high-density material which could be related to blood products. No evidence for active GI bleeding. 2. Cholecystostomy tube is positioned in the gallbladder. Gallbladder is decompressed. 3. Small left pleural effusion with compressive atelectasis at both lung bases. 4. Mild subcutaneous edema with presacral and perirectal edema which could be related to fluid overload. 5. Diffuse sclerosis in the bones compatible with end-stage renal disease. 6. Aortic Atherosclerosis (ICD10-I70.0).  EGD 09/11/22: - Normal esophagus.  - Hiatal hernia.  - A single submucosal papule ( nodule) found in the stomach.  - Two duodenal polyps.  - No specimens collected.  Colonoscopy 09/11/22: - Preparation of the colon was inadequate.  - One 15 mm polyp in the cecum. Not removed today due to concern for GI bleed.  - One 10 mm polyp in the transverse colon with stigmata of recent bleeding, removed with a hot snare. Resected and retrieved. Clip was placed.  - Blood in the rectum. Cleared with lavage.  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 Actively bleeding. Treated with argon plasma coagulation ( APC)  Colonoscopy 09/17/22: - Preparation of the colon was fair at best.  - One 20 mm polyp in the cecum - not removed  - A single ( solitary) ulcer in the proximal transverse colon at site of prior polypectomy. 3 clips were placed.  - A single ( solitary) ulcer in the mid transverse colon - sporadic, no polypoid tissue in the area, no reported polypectomy in this area recently. 2 clips were placed.  - Few small polyps in the transverse colon.  - Diverticulosis in the  transverse colon and in the left colon.  - Blood in the rectum and in the recto- sigmoid colon, lavaged.  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 Treated with argon plasma coagulation ( APC) .  - The examination was otherwise normal.  EGD 12/17/22: - Z- line regular, 38 cm from the incisors.  - No gross lesions in the entire esophagus.  - Sub epithelial nodule/ Gastric varices, without bleeding. Clip was placed for marking.  - Normal examined duodenum.  Colonoscopy 12/17/22: - One 20 mm polyp in the cecum. Resection not attempted.  - A single ( solitary) ulcer in the rectum. Clips ( MR conditional) were placed.  - A single erosion in the rectum.  Clip was placed. - No specimens collected.  ASSESSMENT AND PLAN: Diarrhea Cholecystitis s/p PCT Gastric nodule Large colon polyp - Check CBC and CMP - Imodium 2 mg QID PRN for diarrhea - Referral to IR for PCT exchange - Referral to surgery for consideration of cholecystectomy - EUS/colonoscopy with [facility] with Dr. Sherle Poe, MD

## 2023-03-02 ENCOUNTER — Telehealth: Payer: Self-pay

## 2023-03-02 LAB — COMPREHENSIVE METABOLIC PANEL
ALT: 13 U/L (ref 0–53)
AST: 20 U/L (ref 0–37)
Albumin: 3.5 g/dL (ref 3.5–5.2)
Alkaline Phosphatase: 95 U/L (ref 39–117)
BUN: 16 mg/dL (ref 6–23)
CO2: 29 meq/L (ref 19–32)
Calcium: 9.1 mg/dL (ref 8.4–10.5)
Chloride: 97 meq/L (ref 96–112)
Creatinine, Ser: 4.53 mg/dL (ref 0.40–1.50)
GFR: 11.73 mL/min — CL (ref >60.00)
Glucose, Bld: 129 mg/dL — ABNORMAL HIGH (ref 70–99)
Potassium: 3.3 meq/L — ABNORMAL LOW (ref 3.5–5.1)
Sodium: 139 meq/L (ref 135–145)
Total Bilirubin: 0.7 mg/dL (ref 0.2–1.2)
Total Protein: 7.9 g/dL (ref 6.0–8.3)

## 2023-03-02 NOTE — Telephone Encounter (Signed)
CRITICAL VALUE STICKER   CRITICAL VALUE: Creatinine 4.53                                 GFR 11.73   RECEIVER (on-site recipient of call): Heber [place], LPN   DATE & TIME NOTIFIED: 03/02/23 @ 8:33 am   MESSENGER (representative from lab): [email or web address]   MD NOTIFIED: Dr Leonides Schanz

## 2023-03-04 ENCOUNTER — Other Ambulatory Visit: Payer: Self-pay

## 2023-03-04 ENCOUNTER — Telehealth: Payer: Self-pay

## 2023-03-04 DIAGNOSIS — K3189 Other diseases of stomach and duodenum: Secondary | ICD-10-CM

## 2023-03-04 DIAGNOSIS — K635 Polyp of colon: Secondary | ICD-10-CM

## 2023-03-04 MED ORDER — NA SULFATE-K SULFATE-MG SULF 17.5-3.13-1.6 GM/177ML PO SOLN: 1.0000 | Freq: Once | ORAL | 0 refills | Status: AC

## 2023-03-04 NOTE — Telephone Encounter (Signed)
-----   Message from [facility] sent at 03/03/2023  4:42 PM EDT ----- CD, It is going to be October or November but we can get it done. Thanks. Theophilus Walz, This patient needs an upper EUS For gastric mass and a colonoscopy with EMR for cecal polyp.  These procedures can be scheduled as a 2-hour slot. May use October or November or December to schedule for this patient. Please let Dr. Leonides Schanz know when he is scheduled. Thanks. 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 He was hospitalized and thus his procedures had to be cancelled. He is interested in getting these procedures scheduled again. Labs during his visit earlier this week looked good. Would you be able to help arrange for this?  Thanks, Alan Ripper

## 2023-03-04 NOTE — Telephone Encounter (Signed)
EUS, Colon, EMR has been set up for 05/05/23 at 8 am with GM   Left message on machine to call back

## 2023-03-05 NOTE — Telephone Encounter (Signed)
I called the number that is in the chart and the person that answered states that no one lives there by that name. I have attempted to call the [facility] Left message on machine to call back

## 2023-03-08 NOTE — Telephone Encounter (Signed)
Left message on machine to call back at the number provided by the caregiver

## 2023-03-08 NOTE — Telephone Encounter (Deleted)
See alternate note dated 8/23.

## 2023-03-08 NOTE — Telephone Encounter (Signed)
Inbound call from patient's care giver requesting a call back to discuss prep medication and why patient is scheduled for procedures. Requesting a call back at [phone]. Please advise. Thank you.

## 2023-03-08 NOTE — Telephone Encounter (Signed)
Attempted to reach the pt again on the [facility] # message left

## 2023-03-09 NOTE — Telephone Encounter (Signed)
Dr Leonides Schanz pt question

## 2023-03-09 NOTE — Telephone Encounter (Signed)
Inbound call from patient caregiver in regards to an upcoming appointment patient has scheduled. Requesting to speak with a nurse. Please advise.   Thank you

## 2023-03-09 NOTE — Telephone Encounter (Signed)
The caregiver returned call and has been advised of the colon EMR EUS and instructions. All information has been mailed as well.

## 2023-03-09 NOTE — Telephone Encounter (Signed)
Patient is still waiting to be scheduled with IR in regards to his catheter (PCT) exchange. It was discovered today the phone number listed for the patient was incorrect. This has been corrected. IR has been notified that the patient is waiting to be contacted and scheduled.

## 2023-03-09 NOTE — Telephone Encounter (Signed)
Attempted to reach the pt again on the [facility] contact with no answer- message left  If pt or caregiver call back need a good number to return call

## 2023-03-16 ENCOUNTER — Ambulatory Visit (HOSPITAL_COMMUNITY)
Admission: RE | Admit: 2023-03-16 | Discharge: 2023-03-16 | Disposition: A | Discharge status: Home / Self Care | Payer: Medicare Other | Source: Ambulatory Visit | Attending: Internal Medicine | Admitting: Internal Medicine

## 2023-03-16 ENCOUNTER — Other Ambulatory Visit (HOSPITAL_COMMUNITY): Payer: Self-pay | Admitting: Interventional Radiology

## 2023-03-16 DIAGNOSIS — Z434 Encounter for attention to other artificial openings of digestive tract: Secondary | ICD-10-CM | POA: Diagnosis present

## 2023-03-16 DIAGNOSIS — Z8719 Personal history of other diseases of the digestive system: Secondary | ICD-10-CM | POA: Diagnosis not present

## 2023-03-16 DIAGNOSIS — K3189 Other diseases of stomach and duodenum: Secondary | ICD-10-CM

## 2023-03-16 DIAGNOSIS — K819 Cholecystitis, unspecified: Secondary | ICD-10-CM

## 2023-03-16 DIAGNOSIS — R197 Diarrhea, unspecified: Secondary | ICD-10-CM

## 2023-03-16 DIAGNOSIS — K635 Polyp of colon: Secondary | ICD-10-CM | POA: Diagnosis not present

## 2023-03-16 HISTORY — PX: IR EXCHANGE BILIARY DRAIN: IMG6046

## 2023-03-16 MED ORDER — IOHEXOL 300 MG/ML  SOLN
50.0000 mL | Freq: Once | INTRAMUSCULAR | Status: AC | PRN
  Administered 2023-03-16: 10 mL

## 2023-03-16 MED ORDER — LIDOCAINE HCL 1 % IJ SOLN
10.0000 mL | Freq: Once | INTRAMUSCULAR | Status: AC
  Administered 2023-03-16: 8 mL via INTRADERMAL

## 2023-03-16 MED ORDER — LIDOCAINE HCL 1 % IJ SOLN
INTRAMUSCULAR | Status: AC
  Filled 2023-03-16: qty 20

## 2023-03-16 NOTE — Procedures (Signed)
Interventional Radiology Procedure Note  Procedure: FLUORO CHOLECYSTOSTOMY EXCHG    Complications: None  Estimated Blood Loss:  MIN  Findings: 8 FR TUBE EXCHG'D    Sharen Counter, MD

## 2023-03-17 ENCOUNTER — Ambulatory Visit (HOSPITAL_COMMUNITY): Admission: RE | Admit: 2023-03-17 | Payer: Medicare Other | Source: Ambulatory Visit

## 2023-03-17 ENCOUNTER — Encounter (HOSPITAL_COMMUNITY): Payer: Self-pay

## 2023-03-17 ENCOUNTER — Other Ambulatory Visit: Payer: Self-pay | Admitting: Internal Medicine

## 2023-03-17 DIAGNOSIS — K3189 Other diseases of stomach and duodenum: Secondary | ICD-10-CM

## 2023-03-17 DIAGNOSIS — R197 Diarrhea, unspecified: Secondary | ICD-10-CM

## 2023-03-17 DIAGNOSIS — K635 Polyp of colon: Secondary | ICD-10-CM

## 2023-03-17 DIAGNOSIS — Z8719 Personal history of other diseases of the digestive system: Secondary | ICD-10-CM

## 2023-03-19 ENCOUNTER — Other Ambulatory Visit: Payer: Self-pay

## 2023-03-19 ENCOUNTER — Encounter (HOSPITAL_COMMUNITY): Payer: Self-pay

## 2023-03-19 ENCOUNTER — Emergency Department (HOSPITAL_COMMUNITY)
Admission: EM | Admit: 2023-03-19 | Discharge: 2023-03-19 | Disposition: A | Discharge status: Home / Self Care | Payer: Medicare Other | Attending: Student | Admitting: Student

## 2023-03-19 DIAGNOSIS — Z992 Dependence on renal dialysis: Secondary | ICD-10-CM | POA: Insufficient documentation

## 2023-03-19 DIAGNOSIS — N186 End stage renal disease: Secondary | ICD-10-CM | POA: Insufficient documentation

## 2023-03-19 DIAGNOSIS — Z79899 Other long term (current) drug therapy: Secondary | ICD-10-CM | POA: Diagnosis not present

## 2023-03-19 DIAGNOSIS — Z8546 Personal history of malignant neoplasm of prostate: Secondary | ICD-10-CM | POA: Diagnosis not present

## 2023-03-19 DIAGNOSIS — E1122 Type 2 diabetes mellitus with diabetic chronic kidney disease: Secondary | ICD-10-CM | POA: Insufficient documentation

## 2023-03-19 DIAGNOSIS — I120 Hypertensive chronic kidney disease with stage 5 chronic kidney disease or end stage renal disease: Secondary | ICD-10-CM | POA: Insufficient documentation

## 2023-03-19 DIAGNOSIS — E876 Hypokalemia: Secondary | ICD-10-CM | POA: Diagnosis not present

## 2023-03-19 DIAGNOSIS — R55 Syncope and collapse: Secondary | ICD-10-CM | POA: Diagnosis present

## 2023-03-19 DIAGNOSIS — I959 Hypotension, unspecified: Secondary | ICD-10-CM | POA: Insufficient documentation

## 2023-03-19 DIAGNOSIS — Z87891 Personal history of nicotine dependence: Secondary | ICD-10-CM | POA: Insufficient documentation

## 2023-03-19 LAB — CBC WITH DIFFERENTIAL/PLATELET
Abs Immature Granulocytes: 0.05 10*3/uL (ref 0.00–0.07)
Basophils Absolute: 0 10*3/uL (ref 0.0–0.1)
Basophils Relative: 0 %
Eosinophils Absolute: 0 10*3/uL (ref 0.0–0.5)
Eosinophils Relative: 0 %
HCT: 34.1 % — ABNORMAL LOW (ref 39.0–52.0)
Hemoglobin: 10.4 g/dL — ABNORMAL LOW (ref 13.0–17.0)
Immature Granulocytes: 1 %
Lymphocytes Relative: 6 %
Lymphs Abs: 0.6 10*3/uL — ABNORMAL LOW (ref 0.7–4.0)
MCH: 30.6 pg (ref 26.0–34.0)
MCHC: 30.5 g/dL (ref 30.0–36.0)
MCV: 100.3 fL — ABNORMAL HIGH (ref 80.0–100.0)
Monocytes Absolute: 0.6 10*3/uL (ref 0.1–1.0)
Monocytes Relative: 6 %
Neutro Abs: 8.4 10*3/uL — ABNORMAL HIGH (ref 1.7–7.7)
Neutrophils Relative %: 87 %
Platelets: 128 10*3/uL — ABNORMAL LOW (ref 150–400)
RBC: 3.4 MIL/uL — ABNORMAL LOW (ref 4.22–5.81)
RDW: 17.5 % — ABNORMAL HIGH (ref 11.5–15.5)
WBC: 9.7 10*3/uL (ref 4.0–10.5)
nRBC: 0 % (ref 0.0–0.2)

## 2023-03-19 LAB — COMPREHENSIVE METABOLIC PANEL
ALT: 15 U/L (ref 0–44)
AST: 20 U/L (ref 15–41)
Albumin: 2.4 g/dL — ABNORMAL LOW (ref 3.5–5.0)
Alkaline Phosphatase: 61 U/L (ref 38–126)
Anion gap: 11 (ref 5–15)
BUN: 11 mg/dL (ref 8–23)
CO2: 27 mmol/L (ref 22–32)
Calcium: 6.9 mg/dL — ABNORMAL LOW (ref 8.9–10.3)
Chloride: 101 mmol/L (ref 98–111)
Creatinine, Ser: 4.33 mg/dL — ABNORMAL HIGH (ref 0.61–1.24)
GFR, Estimated: 13 mL/min — ABNORMAL LOW (ref >60)
Glucose, Bld: 116 mg/dL — ABNORMAL HIGH (ref 70–99)
Potassium: 2.7 mmol/L — CL (ref 3.5–5.1)
Sodium: 139 mmol/L (ref 135–145)
Total Bilirubin: 0.7 mg/dL (ref 0.3–1.2)
Total Protein: 6 g/dL — ABNORMAL LOW (ref 6.5–8.1)

## 2023-03-19 MED ORDER — MIDODRINE HCL 5 MG PO TABS
20.0000 mg | ORAL_TABLET | Freq: Once | ORAL | Status: AC
  Administered 2023-03-19: 20 mg via ORAL
  Filled 2023-03-19: qty 4

## 2023-03-19 MED ORDER — LACTATED RINGERS IV BOLUS
500.0000 mL | Freq: Once | INTRAVENOUS | Status: AC
  Administered 2023-03-19: 500 mL via INTRAVENOUS

## 2023-03-19 MED ORDER — POTASSIUM CHLORIDE CRYS ER 20 MEQ PO TBCR
40.0000 meq | EXTENDED_RELEASE_TABLET | Freq: Once | ORAL | Status: AC
  Administered 2023-03-19: 40 meq via ORAL
  Filled 2023-03-19: qty 2

## 2023-03-19 MED ORDER — MAGNESIUM OXIDE -MG SUPPLEMENT 400 (240 MG) MG PO TABS
800.0000 mg | ORAL_TABLET | Freq: Once | ORAL | Status: AC
  Administered 2023-03-19: 800 mg via ORAL
  Filled 2023-03-19: qty 2

## 2023-03-19 NOTE — ED Provider Notes (Signed)
EMERGENCY DEPARTMENT AT [facility] Provider Note  CSN: [id number] Arrival date & time: 03/19/23 1131  Chief Complaint(s) Hypotension  HPI Justin [name] is a 77 y.o. 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  Patient states that he was getting routine dialysis when his blood pressure dropped and he had a syncopal episode.  Patient reportedly hypotensive to 60/30 in the field but arrives with blood pressure 116/90.  Currently denies any symptoms including chest pain, shortness of breath, abdominal pain, nausea, vomiting, numbness, tingling, weakness or other systemic or neurologic complaints.  Patient states that this happens frequently during dialysis.   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) 12/07/2019   Dialysis patient (HCC)    Gout    Hypercalcemia 11/14/2021   Hyperkalemia 10/19/2018   Hypertension    Hypotension 06/13/2022   Right posterior capsular opacification 02/27/2021   Septic shock (HCC)    Vitreous hemorrhage of right eye (HCC) 10/24/2019   Patient Active Problem List   Diagnosis Date Noted   Shock (HCC) 12/22/2022   Rectal ulcer 12/17/2022   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   Hemorrhagic shock (HCC) 12/14/2022   Acute encephalopathy 11/09/2022   Syncope  11/09/2022   Acute blood loss anemia 11/09/2022   Melena 11/09/2022   History of colonic polyps 11/09/2022   End stage renal disease (HCC) 11/09/2022   Gastric nodule 11/09/2022   Rectal bleeding 09/17/2022   Colon ulcer 09/17/2022   Lower GI bleeding 09/17/2022   GI bleed 09/16/2022   Prolonged QT interval 09/16/2022   History of prostate cancer 09/16/2022   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   History of colon polyps 09/16/2022   Lower GI bleed 09/12/2022   Acute GI bleeding 09/10/2022   Wide-complex tachycardia 06/17/2022   Hypotension 06/13/2022   Obesity (BMI 30-39.9) 06/13/2022   Perirectal abscess 06/13/2022   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   Murmur, cardiac 01/27/2022   Chronic back pain 01/27/2022   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/27/2022   GERD (gastroesophageal reflux disease) 11/11/2021   High risk medication use 11/11/2021   History of cardioversion 11/11/2021   Paroxysmal atrial fibrillation with RVR (HCC) 11/11/2021   PVC (premature ventricular contraction) 11/11/2021   Nonrheumatic aortic valve stenosis 11/06/2021   Mixed hyperlipidemia 11/06/2021   Abdominal mass 05/05/2021   First degree AV block 07/13/2020   Paroxysmal A-fib (HCC) 01/29/2020   Wound infection 01/28/2020   H/O vitrectomy 12/07/2019   Macular pucker, 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 determined by examination associated with type 2 diabetes mellitus (HCC) 10/24/2019   Stable treated proliferative diabetic retinopathy of left eye determined by examination associated with type 2 diabetes mellitus (HCC) 10/24/2019   ESRD on hemodialysis (HCC) 07/29/2017   Anticoagulated 07/13/2017   Malignant neoplasm  of prostate (HCC) 07/12/2017   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 Authorizing Provider  acetaminophen (TYLENOL) 325 MG tablet Take 2 tablets (650 mg total) by mouth every 6 (six) hours as needed for mild pain (or Fever >/= 101). 11/24/22   Pokhrel, Rebekah Chesterfield, MD  amiodarone (PACERONE) 200 MG tablet Take 1 tablet (200 mg total) by mouth 2 (two) times daily for 4 days, THEN 1 tablet (200 mg total) daily. 01/02/23 02/05/23  Narda Bonds, MD  atorvastatin (LIPITOR) 40 MG tablet Take 40 mg by mouth at bedtime. 10/30/14   [provider]  b complex-vitamin c-folic acid (NEPHRO-VITE) 0.8 MG TABS tablet Take 1 tablet by mouth at bedtime.    [provider]  calcitRIOL (ROCALTROL) 0.25 MCG capsule Take 3 capsules (0.75 mcg total) by mouth every Monday, Wednesday, and Friday with hemodialysis. Patient taking differently: Take 0.25 mcg by mouth daily. 11/25/22   Pokhrel, Rebekah Chesterfield, MD  cinacalcet (SENSIPAR) 30 MG tablet Take 30 mg by mouth daily. 08/25/22   [provider]  midodrine (PROAMATINE) 10 MG tablet Take 2 tablets (20 mg total) by mouth every 8 (eight) hours. 01/02/23   Narda Bonds, MD  Olopatadine HCl (PATADAY) 0.2 % SOLN Place 1 drop into both eyes daily.    [provider]  pantoprazole (PROTONIX) 40 MG tablet Take 1 tablet (40 mg total) by mouth 2 (two) times daily before a meal. 11/24/22   Pokhrel, Rebekah Chesterfield, MD  pregabalin (LYRICA) 75 MG capsule Take 1 capsule (75 mg total) by mouth at bedtime. Patient taking differently: Take 75 mg by mouth at bedtime. Pt taking twice a day 11/24/22 11/24/23  Pokhrel, Rebekah Chesterfield, MD  sevelamer carbonate (RENVELA) 800 MG tablet Take 800 mg by mouth 2 (two) times daily.    [provider]  traZODone (DESYREL) 50 MG tablet Take 1 tablet (50 mg total) by mouth at bedtime as  needed for sleep. 11/24/22   Joycelyn Das, MD                                                                                                                                    Past Surgical History Past Surgical History:  Procedure Laterality Date   A/V FISTULAGRAM Left 08/17/2017   Procedure: A/V FISTULAGRAM;  Surgeon: Renford Dills, MD;  Location: [facility] INVASIVE CV LAB;  Service: Cardiovascular;  Laterality: Left;   A/V SHUNT INTERVENTION N/A 08/17/2017   Procedure: A/V SHUNT INTERVENTION;  Surgeon: Renford Dills, MD;  Location: [facility] INVASIVE CV LAB;  Service: Cardiovascular;  Laterality: N/A;   AV FISTULA  PLACEMENT Left 05/31/2015   Procedure: ARTERIOVENOUS (AV) FISTULA CREATION;  Surgeon: Renford Dills, MD;  Location: [facility] ORS;  Service: Vascular;  Laterality: Left;   COLONOSCOPY N/A 12/17/2022   Procedure: COLONOSCOPY;  Surgeon: Napoleon Form, MD;  [facility] END[facility]COPY;  Service: Gastroenterology;  Laterality: N/A;   COLONOSCOPY WITH PROPOFOL N/A 09/11/2022   Procedure: COLONOSCOPY WITH PROPOFOL;  Surgeon: Imogene Burn, MD;  Location: [facility] ENDOSCOPY;  Service: Gastroenterology;  Laterality: N/A;   COLONOSCOPY WITH PROPOFOL N/A 09/17/2022   Procedure: COLONOSCOPY WITH PROPOFOL;  Surgeon: Benancio Deeds, MD;  Location: [facility] ENDOSCOPY;  Service: Gastroenterology;  Laterality: N/A;   ESOPHAGOGASTRODUODENOSCOPY (EGD) WITH PROPOFOL N/A 09/11/2022   Procedure: ESOPHAGOGASTRODUODENOSCOPY (EGD) WITH PROPOFOL;  Surgeon: Imogene Burn, MD;  Location: [facility] ENDOSCOPY;  Service: Gastroenterology;  Laterality: N/A;   ESOPHAGOGASTRODUODENOSCOPY (EGD) WITH PROPOFOL N/A 12/17/2022   Procedure: ESOPHAGOGASTRODUODENOSCOPY (EGD) WITH PROPOFOL;  Surgeon: Napoleon Form, MD;  Location: [facility] ENDOSCOPY;  Service: Gastroenterology;  Laterality: N/A;   excision bone spurs Bilateral 1989   feet   GIVENS CAPSULE STUDY N/A 11/10/2022   Procedure: GIVENS CAPSULE STUDY;  Surgeon: Shellia Cleverly, DO;   Location: [facility] ENDOSCOPY;  Service: Gastroenterology;  Laterality: N/A;   HEMOSTASIS CLIP PLACEMENT  09/11/2022   Procedure: HEMOSTASIS CLIP PLACEMENT;  Surgeon: Imogene Burn, MD;  Location: [facility] ENDOSCOPY;  Service: Gastroenterology;;   HEMOSTASIS CLIP PLACEMENT  09/17/2022   Procedure: HEMOSTASIS CLIP PLACEMENT;  Surgeon: Benancio Deeds, MD;  Location: [facility] ENDOSCOPY;  Service: Gastroenterology;;   HEMOSTASIS CLIP PLACEMENT  12/17/2022   Procedure: HEMOSTASIS CLIP PLACEMENT;  Surgeon: Napoleon Form, MD;  Location: [facility] ENDOSCOPY;  Service: Gastroenterology;;   HOT HEMOSTASIS N/A 09/11/2022   Procedure: HOT HEMOSTASIS (ARGON PLASMA COAGULATION/BICAP);  Surgeon: Imogene Burn, MD;  Location: [facility] ENDOSCOPY;  Service: Gastroenterology;  Laterality: N/A;   HOT HEMOSTASIS N/A 09/17/2022   Procedure: HOT HEMOSTASIS (ARGON PLASMA COAGULATION/BICAP);  Surgeon: Benancio Deeds, MD;  Location: [facility] ENDOSCOPY;  Service: Gastroenterology;  Laterality: N/A;   INCISION AND DRAINAGE PERIRECTAL ABSCESS N/A 06/15/2022   Procedure: IRRIGATION AND DEBRIDEMENT PERIRECTAL ABSCESS;  Surgeon: Franky Macho, MD;  Location: [facility] ORS;  Service: General;  Laterality: N/A;   IR EXCHANGE BILIARY DRAIN  03/16/2023   KNEE SURGERY Left 1998   arthroscopy   POLYPECTOMY  09/11/2022   Procedure: POLYPECTOMY;  Surgeon: Imogene Burn, MD;  Location: [facility] ENDOSCOPY;  Service: Gastroenterology;;   SHOULDER SURGERY Left 1994   rotator cuff   Family History Family History  Problem Relation Age of Onset   Kidney disease Mother    Alcoholism Father    Alcoholism Brother    Colon cancer Neg Hx    Stomach cancer Neg Hx    Esophageal cancer Neg Hx     Social History Social History   Tobacco Use   Smoking status: Former    Current packs/day: 0.00    Types: Cigarettes    Quit date: 10/01/1978    Years since quitting: 44.4   Smokeless tobacco: Never  Vaping Use   Vaping status: Never Used  Substance Use Topics   Alcohol use: No    Drug use: No   Allergies Patient has no known allergies.  Review of Systems Review of Systems  Neurological:  Positive for syncope.    Physical Exam Vital Signs  I have reviewed the triage vital signs BP (!) 94/50   Pulse 89   Temp 97.8 F (36.6 C)  Resp 17   Ht 5\' 10"  (1.778 m)   SpO2 98%   BMI 27.52 kg/m   Physical Exam Constitutional:      General: He is not in acute distress.    Appearance: Normal appearance.  HENT:     Head: Normocephalic and atraumatic.     Nose: No congestion or rhinorrhea.  Eyes:     General:        Right eye: No discharge.        Left eye: No discharge.     Extraocular Movements: Extraocular movements intact.     Pupils: Pupils are equal, round, and reactive to light.  Cardiovascular:     Rate and Rhythm: Normal rate and regular rhythm.     Heart sounds: No murmur heard. Pulmonary:     Effort: No respiratory distress.     Breath sounds: No wheezing or rales.  Abdominal:     General: There is no distension.     Tenderness: There is no abdominal tenderness.  Musculoskeletal:        General: Normal range of motion.     Cervical back: Normal range of motion.  Skin:    General: Skin is warm and dry.  Neurological:     General: No focal deficit present.     Mental Status: He is alert.     ED Results and Treatments Labs (all labs ordered are listed, but only abnormal results are displayed) Labs Reviewed  CBC WITH DIFFERENTIAL/PLATELET - Abnormal; Notable for the following components:      Result Value   RBC 3.40 (*)    Hemoglobin 10.4 (*)    HCT 34.1 (*)    MCV 100.3 (*)    RDW 17.5 (*)    Platelets 128 (*)    Neutro Abs 8.4 (*)    Lymphs Abs 0.6 (*)    All other components within normal limits  COMPREHENSIVE METABOLIC PANEL - Abnormal; Notable for the following components:   Potassium 2.7 (*)    Glucose, Bld 116 (*)    Creatinine, Ser 4.33 (*)    Calcium 6.9 (*)    Total Protein 6.0 (*)    Albumin 2.4 (*)    GFR,  Estimated 13 (*)    All other components within normal limits                                                                                                                          Radiology No results found.  Pertinent labs & imaging results that were available during my care of the patient were reviewed by me and considered in my medical decision making (see MDM for details).  Medications Ordered in ED Medications  midodrine (PROAMATINE) tablet 20 mg (20 mg Oral Given 03/19/23 1532)  potassium chloride SA (KLOR-CON M) CR tablet 40 mEq (40 mEq Oral Given 03/19/23 1532)  magnesium oxide (MAG-OX) tablet 800 mg (800 mg Oral Given 03/19/23 1532)  lactated ringers bolus 500  mL (0 mLs Intravenous Stopped 03/19/23 1636)                                                                                                                                     Procedures .Critical Care  Performed by: Glendora Score, MD Authorized by: Glendora Score, MD   Critical care provider statement:    Critical care time (minutes):  30   Critical care was necessary to treat or prevent imminent or life-threatening deterioration of the following conditions:  Circulatory failure   Critical care was time spent personally by me on the following activities:  Development of treatment plan with patient or surrogate, discussions with consultants, evaluation of patient's response to treatment, examination of patient, ordering and review of laboratory studies, ordering and review of radiographic studies, ordering and performing treatments and interventions, pulse oximetry, re-evaluation of patient's condition and review of old charts   (including critical care time)  Medical Decision Making / ED Course   This patient presents to the ED for concern of hypotension, syncope, this involves an extensive number of treatment options, and is a complaint that carries with it a high risk of complications and morbidity.  The differential  diagnosis includes chronic hypotension and vasoplegia, over diuresis during dialysis, vasovagal syncope, cardiogenic syncope, orthostatic syncope, electrolyte abnormality  MDM: Patient seen emergency room for evaluation of hypotension and syncope.  Physical exam is unchanged from his baseline with tenderness at bilateral feet, dialysis fistula in the left upper extremity.  Laboratory evaluation with a hemoglobin of 10.4 with an MCV of 100.3, platelet count 128, hypokalemia to 2.7 which was repleted in the emergency department, creatinine 4.33 consistent with his ESRD.  Patient did become hypotensive here in the emergency department with systolics in the 80s and I gave the patient his home midodrine dosing and a small 500 cc bolus of lactated Ringer's.  On reevaluation, hypotension has since improved.  Patient is requesting discharge in the emergency department and with vital signs improved this is not unreasonable.  Latest blood pressure 139/80 prior to discharge.  Return precautions given which she voiced understanding he was discharged.  This appears to be chronic issue for this patient and frequently occurs during dialysis.  Lower suspicion for acute pathology today.   Additional history obtained: -Additional history obtained from brother -External records from outside source obtained and reviewed including: Chart review including previous notes, labs, imaging, consultation notes   Lab Tests: -I ordered, reviewed, and interpreted labs.   The pertinent results include:   Labs Reviewed  CBC WITH DIFFERENTIAL/PLATELET - Abnormal; Notable for the following components:      Result Value   RBC 3.40 (*)    Hemoglobin 10.4 (*)    HCT 34.1 (*)    MCV 100.3 (*)    RDW 17.5 (*)    Platelets 128 (*)    Neutro Abs 8.4 (*)    Lymphs Abs  0.6 (*)    All other components within normal limits  COMPREHENSIVE METABOLIC PANEL - Abnormal; Notable for the following components:   Potassium 2.7 (*)    Glucose,  Bld 116 (*)    Creatinine, Ser 4.33 (*)    Calcium 6.9 (*)    Total Protein 6.0 (*)    Albumin 2.4 (*)    GFR, Estimated 13 (*)    All other components within normal limits      EKG   EKG Interpretation Date/Time:  Friday March 19 2023 11:45:08 EDT Ventricular Rate:  91 PR Interval:  225 QRS Duration:  105 QT Interval:  383 QTC Calculation: 472 R Axis:   71  Text Interpretation: Normal sinus rhythm Prolonged PR interval Confirmed by Ruthel Martine ([id number]) on 03/19/2023 6:31:03 PM         Medicines ordered and prescription drug management: Meds ordered this encounter  Medications   midodrine (PROAMATINE) tablet 20 mg   potassium chloride SA (KLOR-CON M) CR tablet 40 mEq   magnesium oxide (MAG-OX) tablet 800 mg   lactated ringers bolus 500 mL    -I have reviewed the patients home medicines and have made adjustments as needed  Critical interventions Midodrine, fluid resuscitation    Cardiac Monitoring: The patient was maintained on a cardiac monitor.  I personally viewed and interpreted the cardiac monitored which showed an underlying rhythm of: NSR  Social Determinants of Health:  Factors impacting patients care include: none   Reevaluation: After the interventions noted above, I reevaluated the patient and found that they have :improved  Co morbidities that complicate the patient evaluation  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  Patient discharged with outpatient follow-up.     Final Clinical Impression(s) / ED Diagnoses Final diagnoses:  Hypotension, unspecified hypotension type  Hypokalemia     @PCDICTATION @    Glendora Score, MD 03/19/23 [id number]

## 2023-03-19 NOTE — ED Notes (Signed)
Per lab, K 2.7, Kommer, MD notified.

## 2023-03-19 NOTE — ED Triage Notes (Signed)
Pt arrives via EMS from dialysis center. Staff called EMS due to hypotension (60/30). Pt arrives AxOx4. BP with EMS 116/90. PT denies any complaints at this time.

## 2023-03-29 ENCOUNTER — Emergency Department (HOSPITAL_BASED_OUTPATIENT_CLINIC_OR_DEPARTMENT_OTHER): Payer: Medicare Other

## 2023-03-29 ENCOUNTER — Inpatient Hospital Stay (HOSPITAL_COMMUNITY)
Admission: EM | Admit: 2023-03-29 | Discharge: 2023-04-08 | DRG: 871 | Disposition: A | Discharge status: Home / Self Care | Payer: Medicare Other | Attending: Internal Medicine | Admitting: Internal Medicine

## 2023-03-29 ENCOUNTER — Encounter (HOSPITAL_COMMUNITY): Payer: Self-pay | Admitting: *Deleted

## 2023-03-29 ENCOUNTER — Emergency Department (HOSPITAL_COMMUNITY): Payer: Medicare Other

## 2023-03-29 ENCOUNTER — Other Ambulatory Visit: Payer: Self-pay

## 2023-03-29 DIAGNOSIS — Z1152 Encounter for screening for COVID-19: Secondary | ICD-10-CM

## 2023-03-29 DIAGNOSIS — I480 Paroxysmal atrial fibrillation: Secondary | ICD-10-CM | POA: Diagnosis present

## 2023-03-29 DIAGNOSIS — Z79899 Other long term (current) drug therapy: Secondary | ICD-10-CM | POA: Diagnosis not present

## 2023-03-29 DIAGNOSIS — I953 Hypotension of hemodialysis: Secondary | ICD-10-CM | POA: Diagnosis present

## 2023-03-29 DIAGNOSIS — A419 Sepsis, unspecified organism: Secondary | ICD-10-CM | POA: Diagnosis present

## 2023-03-29 DIAGNOSIS — Z993 Dependence on wheelchair: Secondary | ICD-10-CM

## 2023-03-29 DIAGNOSIS — E875 Hyperkalemia: Secondary | ICD-10-CM | POA: Diagnosis not present

## 2023-03-29 DIAGNOSIS — K529 Noninfective gastroenteritis and colitis, unspecified: Secondary | ICD-10-CM | POA: Diagnosis present

## 2023-03-29 DIAGNOSIS — E1140 Type 2 diabetes mellitus with diabetic neuropathy, unspecified: Secondary | ICD-10-CM | POA: Diagnosis present

## 2023-03-29 DIAGNOSIS — E11311 Type 2 diabetes mellitus with unspecified diabetic retinopathy with macular edema: Secondary | ICD-10-CM | POA: Diagnosis present

## 2023-03-29 DIAGNOSIS — E860 Dehydration: Secondary | ICD-10-CM | POA: Diagnosis present

## 2023-03-29 DIAGNOSIS — Z992 Dependence on renal dialysis: Secondary | ICD-10-CM

## 2023-03-29 DIAGNOSIS — D631 Anemia in chronic kidney disease: Secondary | ICD-10-CM | POA: Diagnosis present

## 2023-03-29 DIAGNOSIS — K6139 Other ischiorectal abscess: Secondary | ICD-10-CM | POA: Diagnosis present

## 2023-03-29 DIAGNOSIS — Z841 Family history of disorders of kidney and ureter: Secondary | ICD-10-CM

## 2023-03-29 DIAGNOSIS — Z6372 Alcoholism and drug addiction in family: Secondary | ICD-10-CM

## 2023-03-29 DIAGNOSIS — I132 Hypertensive heart and chronic kidney disease with heart failure and with stage 5 chronic kidney disease, or end stage renal disease: Secondary | ICD-10-CM | POA: Diagnosis present

## 2023-03-29 DIAGNOSIS — I482 Chronic atrial fibrillation, unspecified: Secondary | ICD-10-CM | POA: Diagnosis present

## 2023-03-29 DIAGNOSIS — Z66 Do not resuscitate: Secondary | ICD-10-CM | POA: Diagnosis present

## 2023-03-29 DIAGNOSIS — Z811 Family history of alcohol abuse and dependence: Secondary | ICD-10-CM

## 2023-03-29 DIAGNOSIS — E876 Hypokalemia: Secondary | ICD-10-CM | POA: Diagnosis present

## 2023-03-29 DIAGNOSIS — R579 Shock, unspecified: Secondary | ICD-10-CM | POA: Diagnosis not present

## 2023-03-29 DIAGNOSIS — E1122 Type 2 diabetes mellitus with diabetic chronic kidney disease: Secondary | ICD-10-CM | POA: Diagnosis present

## 2023-03-29 DIAGNOSIS — R609 Edema, unspecified: Secondary | ICD-10-CM | POA: Diagnosis not present

## 2023-03-29 DIAGNOSIS — R6521 Severe sepsis with septic shock: Secondary | ICD-10-CM | POA: Diagnosis not present

## 2023-03-29 DIAGNOSIS — M898X9 Other specified disorders of bone, unspecified site: Secondary | ICD-10-CM | POA: Diagnosis present

## 2023-03-29 DIAGNOSIS — E785 Hyperlipidemia, unspecified: Secondary | ICD-10-CM | POA: Diagnosis present

## 2023-03-29 DIAGNOSIS — I060 Rheumatic aortic stenosis: Secondary | ICD-10-CM | POA: Diagnosis present

## 2023-03-29 DIAGNOSIS — I959 Hypotension, unspecified: Secondary | ICD-10-CM | POA: Diagnosis present

## 2023-03-29 DIAGNOSIS — L89610 Pressure ulcer of right heel, unstageable: Secondary | ICD-10-CM | POA: Diagnosis present

## 2023-03-29 DIAGNOSIS — Z87891 Personal history of nicotine dependence: Secondary | ICD-10-CM | POA: Diagnosis not present

## 2023-03-29 DIAGNOSIS — Z9049 Acquired absence of other specified parts of digestive tract: Secondary | ICD-10-CM

## 2023-03-29 DIAGNOSIS — N186 End stage renal disease: Secondary | ICD-10-CM | POA: Diagnosis present

## 2023-03-29 DIAGNOSIS — I5032 Chronic diastolic (congestive) heart failure: Secondary | ICD-10-CM | POA: Diagnosis present

## 2023-03-29 DIAGNOSIS — L89620 Pressure ulcer of left heel, unstageable: Secondary | ICD-10-CM | POA: Diagnosis present

## 2023-03-29 DIAGNOSIS — I4892 Unspecified atrial flutter: Secondary | ICD-10-CM | POA: Diagnosis present

## 2023-03-29 DIAGNOSIS — Z7901 Long term (current) use of anticoagulants: Secondary | ICD-10-CM

## 2023-03-29 HISTORY — DX: End stage renal disease: N18.6

## 2023-03-29 HISTORY — DX: Dependence on renal dialysis: Z99.2

## 2023-03-29 LAB — COMPREHENSIVE METABOLIC PANEL
ALT: 15 U/L (ref 0–44)
AST: 17 U/L (ref 15–41)
Albumin: 2.5 g/dL — ABNORMAL LOW (ref 3.5–5.0)
Alkaline Phosphatase: 87 U/L (ref 38–126)
Anion gap: 12 (ref 5–15)
BUN: 36 mg/dL — ABNORMAL HIGH (ref 8–23)
CO2: 27 mmol/L (ref 22–32)
Calcium: 8.6 mg/dL — ABNORMAL LOW (ref 8.9–10.3)
Chloride: 101 mmol/L (ref 98–111)
Creatinine, Ser: 9.26 mg/dL — ABNORMAL HIGH (ref 0.61–1.24)
GFR, Estimated: 5 mL/min — ABNORMAL LOW (ref >60)
Glucose, Bld: 129 mg/dL — ABNORMAL HIGH (ref 70–99)
Potassium: 3.1 mmol/L — ABNORMAL LOW (ref 3.5–5.1)
Sodium: 140 mmol/L (ref 135–145)
Total Bilirubin: 0.6 mg/dL (ref 0.3–1.2)
Total Protein: 6.9 g/dL (ref 6.5–8.1)

## 2023-03-29 LAB — CBC WITH DIFFERENTIAL/PLATELET
Abs Immature Granulocytes: 0.03 10*3/uL (ref 0.00–0.07)
Basophils Absolute: 0 10*3/uL (ref 0.0–0.1)
Basophils Relative: 0 %
Eosinophils Absolute: 0.1 10*3/uL (ref 0.0–0.5)
Eosinophils Relative: 1 %
HCT: 32.5 % — ABNORMAL LOW (ref 39.0–52.0)
Hemoglobin: 9.8 g/dL — ABNORMAL LOW (ref 13.0–17.0)
Immature Granulocytes: 0 %
Lymphocytes Relative: 13 %
Lymphs Abs: 1.1 10*3/uL (ref 0.7–4.0)
MCH: 30.4 pg (ref 26.0–34.0)
MCHC: 30.2 g/dL (ref 30.0–36.0)
MCV: 100.9 fL — ABNORMAL HIGH (ref 80.0–100.0)
Monocytes Absolute: 0.9 10*3/uL (ref 0.1–1.0)
Monocytes Relative: 11 %
Neutro Abs: 6.2 10*3/uL (ref 1.7–7.7)
Neutrophils Relative %: 75 %
Platelets: 166 10*3/uL (ref 150–400)
RBC: 3.22 MIL/uL — ABNORMAL LOW (ref 4.22–5.81)
RDW: 17.2 % — ABNORMAL HIGH (ref 11.5–15.5)
WBC: 8.3 10*3/uL (ref 4.0–10.5)
nRBC: 0 % (ref 0.0–0.2)

## 2023-03-29 LAB — BASIC METABOLIC PANEL
Anion gap: 16 — ABNORMAL HIGH (ref 5–15)
BUN: 39 mg/dL — ABNORMAL HIGH (ref 8–23)
CO2: 24 mmol/L (ref 22–32)
Calcium: 9.2 mg/dL (ref 8.9–10.3)
Chloride: 98 mmol/L (ref 98–111)
Creatinine, Ser: 9.38 mg/dL — ABNORMAL HIGH (ref 0.61–1.24)
GFR, Estimated: 5 mL/min — ABNORMAL LOW (ref >60)
Glucose, Bld: 194 mg/dL — ABNORMAL HIGH (ref 70–99)
Potassium: 3.6 mmol/L (ref 3.5–5.1)
Sodium: 138 mmol/L (ref 135–145)

## 2023-03-29 LAB — MRSA NEXT GEN BY PCR, NASAL: MRSA by PCR Next Gen: NOT DETECTED

## 2023-03-29 LAB — I-STAT CG4 LACTIC ACID, ED: Lactic Acid, Venous: 1.6 mmol/L (ref 0.5–1.9)

## 2023-03-29 LAB — GLUCOSE, CAPILLARY: Glucose-Capillary: 169 mg/dL — ABNORMAL HIGH (ref 70–99)

## 2023-03-29 LAB — MAGNESIUM: Magnesium: 2.2 mg/dL (ref 1.7–2.4)

## 2023-03-29 LAB — POC OCCULT BLOOD, ED: Fecal Occult Bld: NEGATIVE

## 2023-03-29 MED ORDER — AMIODARONE HCL 200 MG PO TABS
200.0000 mg | ORAL_TABLET | Freq: Every day | ORAL | Status: DC
  Administered 2023-03-29 – 2023-04-07 (×10): 200 mg via ORAL
  Filled 2023-03-29 (×9): qty 1

## 2023-03-29 MED ORDER — HEPARIN SODIUM (PORCINE) 5000 UNIT/ML IJ SOLN
5000.0000 [IU] | Freq: Three times a day (TID) | INTRAMUSCULAR | Status: DC
  Administered 2023-03-29 – 2023-03-30 (×2): 5000 [IU] via SUBCUTANEOUS
  Filled 2023-03-29 (×2): qty 1

## 2023-03-29 MED ORDER — LACTATED RINGERS IV BOLUS
250.0000 mL | Freq: Once | INTRAVENOUS | Status: AC
  Administered 2023-03-29: 250 mL via INTRAVENOUS

## 2023-03-29 MED ORDER — ATORVASTATIN CALCIUM 40 MG PO TABS
40.0000 mg | ORAL_TABLET | Freq: Every day | ORAL | Status: DC
  Administered 2023-03-30 – 2023-04-07 (×9): 40 mg via ORAL
  Filled 2023-03-29 (×9): qty 1

## 2023-03-29 MED ORDER — HYDROCORTISONE SOD SUC (PF) 100 MG IJ SOLR
100.0000 mg | Freq: Once | INTRAMUSCULAR | Status: AC
  Administered 2023-03-29: 100 mg via INTRAVENOUS
  Filled 2023-03-29: qty 2

## 2023-03-29 MED ORDER — VANCOMYCIN HCL 1750 MG/350ML IV SOLN
1750.0000 mg | Freq: Once | INTRAVENOUS | Status: DC
  Filled 2023-03-29: qty 350

## 2023-03-29 MED ORDER — MIDODRINE HCL 5 MG PO TABS
20.0000 mg | ORAL_TABLET | Freq: Three times a day (TID) | ORAL | Status: DC
  Administered 2023-03-29 – 2023-04-08 (×31): 20 mg via ORAL
  Filled 2023-03-29 (×31): qty 4

## 2023-03-29 MED ORDER — PANTOPRAZOLE SODIUM 40 MG PO TBEC
40.0000 mg | DELAYED_RELEASE_TABLET | Freq: Two times a day (BID) | ORAL | Status: DC
  Administered 2023-03-29 – 2023-04-08 (×18): 40 mg via ORAL
  Filled 2023-03-29 (×18): qty 1

## 2023-03-29 MED ORDER — CHLORHEXIDINE GLUCONATE CLOTH 2 % EX PADS
6.0000 | MEDICATED_PAD | Freq: Every day | CUTANEOUS | Status: DC
  Administered 2023-03-29 – 2023-04-08 (×6): 6 via TOPICAL

## 2023-03-29 MED ORDER — LACTATED RINGERS IV BOLUS
500.0000 mL | Freq: Once | INTRAVENOUS | Status: AC
  Administered 2023-03-29: 500 mL via INTRAVENOUS

## 2023-03-29 MED ORDER — ALBUMIN HUMAN 25 % IV SOLN
50.0000 g | Freq: Four times a day (QID) | INTRAVENOUS | Status: AC
  Administered 2023-03-29: 50 g via INTRAVENOUS
  Filled 2023-03-29: qty 200

## 2023-03-29 MED ORDER — NOREPINEPHRINE 4 MG/250ML-% IV SOLN
2.0000 ug/min | INTRAVENOUS | Status: DC
  Administered 2023-03-29: 2 ug/min via INTRAVENOUS
  Administered 2023-03-29: 9 ug/min via INTRAVENOUS
  Administered 2023-03-30: 5 ug/min via INTRAVENOUS
  Administered 2023-03-30: 9 ug/min via INTRAVENOUS
  Administered 2023-03-31 – 2023-04-01 (×2): 6 ug/min via INTRAVENOUS
  Filled 2023-03-29 (×7): qty 250

## 2023-03-29 MED ORDER — PREGABALIN 50 MG PO CAPS
75.0000 mg | ORAL_CAPSULE | Freq: Every day | ORAL | Status: DC
  Administered 2023-03-29 – 2023-03-30 (×2): 75 mg via ORAL
  Filled 2023-03-29 (×2): qty 1

## 2023-03-29 MED ORDER — CINACALCET HCL 30 MG PO TABS
30.0000 mg | ORAL_TABLET | Freq: Every day | ORAL | Status: DC
  Administered 2023-03-30 – 2023-04-08 (×10): 30 mg via ORAL
  Filled 2023-03-29 (×10): qty 1

## 2023-03-29 MED ORDER — SODIUM CHLORIDE 0.9 % IV SOLN: 250.0000 mL | INTRAVENOUS | Status: DC

## 2023-03-29 MED ORDER — IOHEXOL 350 MG/ML SOLN
75.0000 mL | Freq: Once | INTRAVENOUS | Status: AC | PRN
  Administered 2023-03-29: 75 mL via INTRAVENOUS

## 2023-03-29 MED ORDER — SODIUM CHLORIDE 0.9 % IV SOLN
2.0000 g | Freq: Once | INTRAVENOUS | Status: AC
  Administered 2023-03-29: 2 g via INTRAVENOUS
  Filled 2023-03-29: qty 12.5

## 2023-03-29 MED ORDER — ORAL CARE MOUTH RINSE: 15.0000 mL | OROMUCOSAL | Status: DC | PRN

## 2023-03-29 MED ORDER — TRAZODONE HCL 50 MG PO TABS
50.0000 mg | ORAL_TABLET | Freq: Every evening | ORAL | Status: DC | PRN
  Administered 2023-03-29 – 2023-04-07 (×3): 50 mg via ORAL
  Filled 2023-03-29 (×4): qty 1

## 2023-03-29 MED ORDER — DOCUSATE SODIUM 100 MG PO CAPS: 100.0000 mg | ORAL_CAPSULE | Freq: Two times a day (BID) | ORAL | Status: DC | PRN

## 2023-03-29 MED ORDER — ACETAMINOPHEN 325 MG PO TABS
650.0000 mg | ORAL_TABLET | Freq: Four times a day (QID) | ORAL | Status: DC | PRN
  Administered 2023-03-29 – 2023-04-04 (×4): 650 mg via ORAL
  Filled 2023-03-29 (×4): qty 2

## 2023-03-29 MED ORDER — ENSURE ENLIVE PO LIQD
237.0000 mL | Freq: Two times a day (BID) | ORAL | Status: DC
  Administered 2023-03-30 – 2023-04-06 (×6): 237 mL via ORAL
  Filled 2023-03-29: qty 237

## 2023-03-29 MED ORDER — POLYETHYLENE GLYCOL 3350 17 G PO PACK: 17.0000 g | PACK | Freq: Every day | ORAL | Status: DC | PRN

## 2023-03-29 MED ORDER — SEVELAMER CARBONATE 800 MG PO TABS
800.0000 mg | ORAL_TABLET | Freq: Two times a day (BID) | ORAL | Status: DC
  Administered 2023-03-29 – 2023-04-08 (×19): 800 mg via ORAL
  Filled 2023-03-29 (×20): qty 1

## 2023-03-29 NOTE — ED Provider Notes (Addendum)
[place] EMERGENCY DEPARTMENT AT [facility] Provider Note   CSN: [id number] Arrival date & time: 03/29/23  3474     History  Chief Complaint  Patient presents with   Hypotension    Justin [name] is a 77 y.o. male.  HPI    77 y.o. 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  The patient presented for his regular scheduled dialysis session this morning and was found to be hypotensive and was sent to the emergency department for further evaluation.  He states that he was seen on 03/19/2023  for a similar complaint of hypotension during dialysis.  He states that his blood pressure is normally soft at about 105 systolic.   He denies any abdominal pain, nausea or vomiting.  He states that he has been tolerating oral intake and on my initial history of present illness the patient was eating appear resting comfortably in no distress.  He denies any chest pain or shortness of breath.  He does state that he has had left lower extremity swelling over the past 2 days which is new. No recent syncope or lightheadedness. No fevers, chills or cough.   He states that he did take his home Midodrine this morning at 0400 and even took an extra dose which didn't help. He denies any recent rectal bleeding.   Home Medications Prior to Admission medications   Medication Sig Start Date End Date Taking? Authorizing Provider  acetaminophen (TYLENOL) 325 MG tablet Take 2 tablets (650 mg total) by mouth every 6 (six) hours as needed for mild pain (or Fever >/= 101). 11/24/22  Yes Pokhrel, Laxman, MD  atorvastatin (LIPITOR) 40 MG tablet Take 40 mg by mouth at bedtime. 10/30/14  Yes [provider]  calcitRIOL (ROCALTROL) 0.25 MCG capsule  Take 3 capsules (0.75 mcg total) by mouth every Monday, Wednesday, and Friday with hemodialysis. Patient taking differently: Take 0.25 mcg by mouth daily. 11/25/22  Yes Pokhrel, Laxman, MD  midodrine (PROAMATINE) 10 MG tablet Take 2 tablets (20 mg total) by mouth every 8 (eight) hours. 01/02/23  Yes Narda Bonds, MD  pantoprazole (PROTONIX) 40 MG tablet Take 1 tablet (40 mg total) by mouth 2 (two) times daily before a meal. 11/24/22  Yes Pokhrel, Laxman, MD  pregabalin (LYRICA) 75 MG capsule Take 1 capsule (75 mg total) by mouth at bedtime. 11/24/22 11/24/23 Yes Pokhrel, Rebekah Chesterfield, MD  amiodarone (PACERONE) 200 MG tablet Take 1 tablet (200 mg total) by mouth 2 (two) times daily for 4 days, THEN 1 tablet (200 mg total) daily. Patient not taking: Reported on 03/29/2023 01/02/23 02/05/23  Narda Bonds, MD  b complex-vitamin c-folic acid (NEPHRO-VITE) 0.8 MG TABS tablet Take 1 tablet by mouth at bedtime. Patient not taking: Reported on 03/29/2023    [provider]  cinacalcet (SENSIPAR) 30 MG tablet Take 30 mg by mouth daily. Patient not taking: Reported on 03/29/2023 08/25/22   [provider]  Olopatadine HCl (PATADAY) 0.2 % SOLN Place 1 drop into both eyes daily. Patient not taking: Reported on 03/29/2023    [provider]  sevelamer carbonate (RENVELA) 800 MG tablet Take 800 mg by mouth 2 (two) times daily. Patient not taking: Reported on 03/29/2023    [provider]  traZODone (DESYREL)  50 MG tablet Take 1 tablet (50 mg total) by mouth at bedtime as needed for sleep. Patient not taking: Reported on 03/29/2023 11/24/22   Joycelyn Das, MD      Allergies    Patient has no known allergies.    Review of Systems   Review of Systems  All other systems reviewed and are negative.   Physical Exam Updated Vital Signs BP 110/70 (BP Location: Right Arm)   Pulse (!) 123   Temp 98.2 F (36.8 C)   Resp 20   Ht 5\' 10"  (1.778 m)   Wt 82.6 kg   SpO2 97%   BMI 26.11  kg/m  Physical Exam Vitals and nursing note reviewed. Exam conducted with a chaperone present.  Constitutional:      General: He is not in acute distress.    Appearance: He is well-developed.  HENT:     Head: Normocephalic and atraumatic.  Eyes:     Conjunctiva/sclera: Conjunctivae normal.  Cardiovascular:     Rate and Rhythm: Regular rhythm. Tachycardia present.     Heart sounds: Murmur heard.  Pulmonary:     Effort: Pulmonary effort is normal. No respiratory distress.     Breath sounds: Normal breath sounds.     Comments: Lungs CTAB Abdominal:     Palpations: Abdomen is soft.     Tenderness: There is no abdominal tenderness.     Comments: RUQ cholecystostomy tubing and bag in place draining clear yellow fluid  Genitourinary:    Rectum: Guaiac result negative.     Comments: No melena or hematochezia Musculoskeletal:        General: Swelling present.     Cervical back: Neck supple.     Right lower leg: No edema.     Left lower leg: Edema present.     Comments: 2+ LLE edema, asymmetric compared to the RLE which has none  Skin:    General: Skin is warm and dry.     Capillary Refill: Capillary refill takes less than 2 seconds.  Neurological:     Mental Status: He is alert.  Psychiatric:        Mood and Affect: Mood normal.     ED Results / Procedures / Treatments   Labs (all labs ordered are listed, but only abnormal results are displayed) Labs Reviewed  COMPREHENSIVE METABOLIC PANEL - Abnormal; Notable for the following components:      Result Value   Potassium 3.1 (*)    Glucose, Bld 129 (*)    BUN 36 (*)    Creatinine, Ser 9.26 (*)    Calcium 8.6 (*)    Albumin 2.5 (*)    GFR, Estimated 5 (*)    All other components within normal limits  CBC WITH DIFFERENTIAL/PLATELET - Abnormal; Notable for the following components:   RBC 3.22 (*)    Hemoglobin 9.8 (*)    HCT 32.5 (*)    MCV 100.9 (*)    RDW 17.2 (*)    All other components within normal limits  CULTURE,  BLOOD (ROUTINE X 2)  CULTURE, BLOOD (ROUTINE X 2)  RESP PANEL BY RT-PCR (RSV, FLU A&B, COVID)  RVPGX2  I-STAT CG4 LACTIC ACID, ED  POC OCCULT BLOOD, ED    EKG EKG Interpretation Date/Time:  Monday March 29 2023 08:13:30 EDT Ventricular Rate:  129 PR Interval:  82 QRS Duration:  107 QT Interval:  328 QTC Calculation: 481 R Axis:   100  Text Interpretation: Sinus tachycardia Right axis deviation  Low voltage, extremity leads Nonspecific T abnormalities, inferior leads Borderline prolonged QT interval Confirmed by Ernie Avena ([id number]) on 03/29/2023 8:17:23 AM  Radiology VAS [place] LOWER EXTREMITY VENOUS (DVT) (ONLY [facility] & [facility])  Result Date: 03/29/2023  Lower Venous DVT Study Patient Name:  JEVAUN TIPPENS  Date of Exam:   03/29/2023 Medical Rec #: [id number]       Accession #:    [id number] Date of Birth: 04-19-1946       Patient Gender: M Patient Age:   33 years Exam Location:  [facility] Procedure:      VAS [place] LOWER EXTREMITY VENOUS (DVT) Referring Phys: Ernie Avena --------------------------------------------------------------------------------  Indications: Edema, and Hypotension and headache today prior to receiving dialysis.  Limitations: Patient somnolence, edema of calves, unable to remove pants, and inability to keep leg on bed. Comparison Study: No prior study on file Performing Technologist: Sherren Kerns RVS  Examination Guidelines: A complete evaluation includes B-mode imaging, spectral Doppler, color Doppler, and power Doppler as needed of all accessible portions of each vessel. Bilateral testing is considered an integral part of a complete examination. Limited examinations for reoccurring indications may be performed as noted. The reflux portion of the exam is performed with the patient in reverse Trendelenburg.  +---------+---------------+---------+-----------+---------------+--------------+ RIGHT    CompressibilityPhasicitySpontaneityProperties     Thrombus Aging  +---------+---------------+---------+-----------+---------------+--------------+ CFV      Full                               pulsatile                                                                 waveforms                     +---------+---------------+---------+-----------+---------------+--------------+ SFJ      Full                                                             +---------+---------------+---------+-----------+---------------+--------------+ FV Prox                                     pulsatile      patent by                                                  waveforms      color and                                                                 Doppler        +---------+---------------+---------+-----------+---------------+--------------+ FV  Mid   Full                               pulsatile      patent by                                                  waveforms      color and                                                                 Doppler        +---------+---------------+---------+-----------+---------------+--------------+ FV Distal                                   pulsatile      patent by                                                  waveforms      color and                                                                 Doppler        +---------+---------------+---------+-----------+---------------+--------------+ PFV                                         pulsatile      patent by                                                  waveforms      color and                                                                 Doppler        +---------+---------------+---------+-----------+---------------+--------------+ POP                                         pulsatile      patent by  waveforms      color and                                                                  Doppler        +---------+---------------+---------+-----------+---------------+--------------+ PTV                                                        Not well                                                                  visualized     +---------+---------------+---------+-----------+---------------+--------------+ PERO                                                       Not well                                                                  visualized     +---------+---------------+---------+-----------+---------------+--------------+   +---------+---------------+---------+-----------+---------------+--------------+ LEFT     CompressibilityPhasicitySpontaneityProperties     Thrombus Aging +---------+---------------+---------+-----------+---------------+--------------+ CFV      Full                               pulsatile                                                                 waveforms                     +---------+---------------+---------+-----------+---------------+--------------+ SFJ      Full                                                             +---------+---------------+---------+-----------+---------------+--------------+ FV Prox  Full                               pulsatile  waveforms                     +---------+---------------+---------+-----------+---------------+--------------+ FV Mid   Full                               pulsatile                                                                 waveforms                     +---------+---------------+---------+-----------+---------------+--------------+ FV Distal                                   pulsatile      patent by                                                  waveforms      color and                                                                  Doppler        +---------+---------------+---------+-----------+---------------+--------------+ PFV      Full                               pulsatile                                                                 waveforms                     +---------+---------------+---------+-----------+---------------+--------------+ POP                                         pulsatile      patent by                                                  waveforms      color and  Doppler        +---------+---------------+---------+-----------+---------------+--------------+ PTV                                                        Not well                                                                  visualized     +---------+---------------+---------+-----------+---------------+--------------+ PERO                                                       Not well                                                                  visualized     +---------+---------------+---------+-----------+---------------+--------------+ Gastroc                                     pulsatile      patent by                                                  waveforms      color and                                                                 Doppler        +---------+---------------+---------+-----------+---------------+--------------+     Summary: RIGHT: - There is no evidence of deep vein thrombosis in the lower extremity. However, portions of this examination were limited- see technologist comments above.  pulsatile waveforms consistent with fluid overload  LEFT: - There is no evidence of deep vein thrombosis in the lower extremity. However, portions of this examination were limited- see technologist comments above.  Pulsatile waveforms  consistent with fluid overload.  *See table(s) above for measurements and observations. Electronically signed by Lemar Livings MD on 03/29/2023 at 4:43:22 PM.    Final    CT Angio Chest PE W and/or Wo Contrast  Result Date: 03/29/2023 CLINICAL DATA:  Hypotension during dialysis, headache, sepsis EXAM: CT ANGIOGRAPHY CHEST CT ABDOMEN AND PELVIS WITH CONTRAST TECHNIQUE: Multidetector CT imaging of the chest was performed using the standard protocol during bolus administration of intravenous contrast. Multiplanar CT image reconstructions and MIPs were obtained to evaluate the vascular anatomy.  Multidetector CT imaging of the abdomen and pelvis was performed using the standard protocol during bolus administration of intravenous contrast. RADIATION DOSE REDUCTION: This exam was performed according to the departmental dose-optimization program which includes automated exposure control, adjustment of the mA and/or kV according to patient size and/or use of iterative reconstruction technique. CONTRAST:  75mL OMNIPAQUE IOHEXOL 350 MG/ML SOLN COMPARISON:  03/29/2023, 12/14/2022 FINDINGS: CTA CHEST FINDINGS Cardiovascular: This is a technically adequate evaluation of the pulmonary vasculature. No filling defects or pulmonary emboli. The heart is unremarkable without pericardial effusion. No evidence of thoracic aortic aneurysm or dissection. Atherosclerosis of the aorta and coronary vasculature. Mediastinum/Nodes: No enlarged mediastinal, hilar, or axillary lymph nodes. Thyroid gland, trachea, and esophagus demonstrate no significant findings. Lungs/Pleura: Mild upper lobe predominant emphysema. Bibasilar hypoventilatory changes. No acute airspace disease, effusion, or pneumothorax. Central airways are patent. 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 No acute or destructive bony abnormalities. Reconstructed images demonstrate no additional findings. Review of the MIP images confirms the  above findings. CT ABDOMEN and PELVIS FINDINGS Hepatobiliary: Percutaneous cholecystostomy tube again coiled within the gallbladder lumen. Calcified gallstones with mild gallbladder wall thickening unchanged. No pericholecystic fat stranding or free fluid. Liver is unremarkable. Mild stable intrahepatic biliary duct dilation. Pancreas: Unremarkable. No pancreatic ductal dilatation or surrounding inflammatory changes. Spleen: Normal in size without focal abnormality. Adrenals/Urinary Tract: Chronic renal atrophy consistent with history of end-stage renal disease. No acute renal findings. Bladder is decompressed, limiting its evaluation. The adrenals are stable. Stomach/Bowel: No bowel obstruction or ileus. Normal appendix right lower quadrant. Moderate retained stool, most pronounced in the rectal wall, which could reflect an element of fecal impaction. There is mild rectal wall thickening and presacral fat stranding which could reflect an element of stercoral colitis. The mass lesion seen within the gastric cardia on prior study is difficult to appreciate on this exam due to under distension, though is likely seen on image 19/3 measuring up to 3.9 cm. Vascular/Lymphatic: Aortic atherosclerosis. No enlarged abdominal or pelvic lymph nodes. Reproductive: Stable enlargement of the prostate with fiduciary markers again noted. Other: No free fluid or free intraperitoneal gas. No abdominal wall hernia. Mild diffuse body wall edema. Musculoskeletal: Stable findings of renal osteodystrophy. There is a new rim enhancing fluid collection along the inferior margin of the left ischium, measuring up to 3.3 x 3.1 x 3.5 cm reference image 97/3. No evidence of underlying bony abnormality. No acute fractures. Reconstructed images demonstrate no additional findings. Review of the MIP images confirms the above findings. IMPRESSION: Chest: 1. No evidence of pulmonary embolus. 2. Bibasilar hypoventilatory changes.  No acute airspace  disease. 3. Aortic Atherosclerosis (ICD10-I70.0) and Emphysema (ICD10-J43.9). Abdomen/pelvis: 1. New 3.5 cm rim enhancing fluid collection within the soft tissues adjacent to the inferior margin of the left ischium, suspicious for soft tissue abscess. No evidence of underlying osteomyelitis or signs of overlying soft tissue ulceration. 2. Moderate retained stool within the rectal vault, consistent with fecal impaction. Mild rectal wall thickening and perirectal fat stranding could reflect superimposed stercoral colitis. 3. Stable indwelling cholecystostomy tube, with decompression of the gallbladder. Stable calcified gallstones noted. 4. Findings consistent with known end-stage renal disease, with diffuse renal osteodystrophy again noted. 5.  Aortic Atherosclerosis (ICD10-I70.0). 6. 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 Electronically Signed   By: Casimiro Needle  Manson Passey M.D.   On: 03/29/2023 15:38   CT ABDOMEN PELVIS W CONTRAST  Result Date: 03/29/2023 CLINICAL DATA:  Hypotension during dialysis, headache, sepsis EXAM: CT ANGIOGRAPHY CHEST CT ABDOMEN AND PELVIS WITH CONTRAST TECHNIQUE: Multidetector CT imaging of the chest was performed using the standard protocol during bolus administration of intravenous contrast. Multiplanar CT image reconstructions and MIPs were obtained to evaluate the vascular anatomy. Multidetector CT imaging of the abdomen and pelvis was performed using the standard protocol during bolus administration of intravenous contrast. RADIATION DOSE REDUCTION: This exam was performed according to the departmental dose-optimization program which includes automated exposure control, adjustment of the mA and/or kV according to patient size and/or use of iterative reconstruction technique. CONTRAST:  75mL OMNIPAQUE  IOHEXOL 350 MG/ML SOLN COMPARISON:  03/29/2023, 12/14/2022 FINDINGS: CTA CHEST FINDINGS Cardiovascular: This is a technically adequate evaluation of the pulmonary vasculature. No filling defects or pulmonary emboli. The heart is unremarkable without pericardial effusion. No evidence of thoracic aortic aneurysm or dissection. Atherosclerosis of the aorta and coronary vasculature. Mediastinum/Nodes: No enlarged mediastinal, hilar, or axillary lymph nodes. Thyroid gland, trachea, and esophagus demonstrate no significant findings. Lungs/Pleura: Mild upper lobe predominant emphysema. Bibasilar hypoventilatory changes. No acute airspace disease, effusion, or pneumothorax. Central airways are patent. 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 No acute or destructive bony abnormalities. Reconstructed images demonstrate no additional findings. Review of the MIP images confirms the above findings. CT ABDOMEN and PELVIS FINDINGS Hepatobiliary: Percutaneous cholecystostomy tube again coiled within the gallbladder lumen. Calcified gallstones with mild gallbladder wall thickening unchanged. No pericholecystic fat stranding or free fluid. Liver is unremarkable. Mild stable intrahepatic biliary duct dilation. Pancreas: Unremarkable. No pancreatic ductal dilatation or surrounding inflammatory changes. Spleen: Normal in size without focal abnormality. Adrenals/Urinary Tract: Chronic renal atrophy consistent with history of end-stage renal disease. No acute renal findings. Bladder is decompressed, limiting its evaluation. The adrenals are stable. Stomach/Bowel: No bowel obstruction or ileus. Normal appendix right lower quadrant. Moderate retained stool, most pronounced in the rectal wall, which could reflect an element of fecal impaction. There is mild rectal wall thickening and presacral fat stranding which could reflect an element of stercoral colitis. The mass lesion seen within the gastric cardia  on prior study is difficult to appreciate on this exam due to under distension, though is likely seen on image 19/3 measuring up to 3.9 cm. Vascular/Lymphatic: Aortic atherosclerosis. No enlarged abdominal or pelvic lymph nodes. Reproductive: Stable enlargement of the prostate with fiduciary markers again noted. Other: No free fluid or free intraperitoneal gas. No abdominal wall hernia. Mild diffuse body wall edema. Musculoskeletal: Stable findings of renal osteodystrophy. There is a new rim enhancing fluid collection along the inferior margin of the left ischium, measuring up to 3.3 x 3.1 x 3.5 cm reference image 97/3. No evidence of underlying bony abnormality. No acute fractures. Reconstructed images demonstrate no additional findings. Review of the MIP images confirms the above findings. IMPRESSION: Chest: 1. No evidence of pulmonary embolus. 2. Bibasilar hypoventilatory changes.  No acute airspace disease. 3. Aortic Atherosclerosis (ICD10-I70.0) and Emphysema (ICD10-J43.9). Abdomen/pelvis: 1. New 3.5 cm rim enhancing fluid collection within the soft tissues adjacent to the inferior margin of the left ischium, suspicious for soft tissue abscess. No evidence of underlying osteomyelitis or signs of overlying soft tissue ulceration. 2. Moderate retained stool within the rectal vault, consistent with fecal impaction. Mild rectal wall thickening and perirectal fat stranding could reflect superimposed stercoral colitis. 3. Stable indwelling cholecystostomy tube, with decompression of  the gallbladder. Stable calcified gallstones noted. 4. Findings consistent with known end-stage renal disease, with diffuse renal osteodystrophy again noted. 5.  Aortic Atherosclerosis (ICD10-I70.0). 6. 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  Electronically Signed   By: Sharlet Salina M.D.   On: 03/29/2023 15:38   DG Chest Port 1 View  Result Date: 03/29/2023 CLINICAL DATA:  Hypertension.  Headache. EXAM: PORTABLE CHEST 1 VIEW COMPARISON:  November 08, 2022. FINDINGS: Stable cardiomediastinal silhouette. Right internal jugular dialysis catheter is in grossly good position. Minimal left basilar subsegmental atelectasis is noted. The visualized skeletal structures are unremarkable. IMPRESSION: Minimal left basilar subsegmental atelectasis. Aortic Atherosclerosis (ICD10-I70.0). Electronically Signed   By: Lupita Raider M.D.   On: 03/29/2023 09:34    Procedures .Critical Care  Performed by: Ernie Avena, MD Authorized by: Ernie Avena, MD   Critical care provider statement:    Critical care time (minutes):  45   Critical care was necessary to treat or prevent imminent or life-threatening deterioration of the following conditions:  Shock   Critical care was time spent personally by me on the following activities:  Development of treatment plan with patient or surrogate, discussions with consultants, evaluation of patient's response to treatment, examination of patient, ordering and review of laboratory studies, ordering and review of radiographic studies, ordering and performing treatments and interventions, pulse oximetry, re-evaluation of patient's condition and review of old charts Fecal disimpaction  Date/Time: 03/29/2023 5:09 PM  Performed by: Ernie Avena, MD Authorized by: Ernie Avena, MD  Consent: Verbal consent obtained. Risks and benefits: risks, benefits and alternatives were discussed Consent given by: patient Required items: required blood products, implants, devices, and special equipment available Patient identity confirmed: verbally with patient and arm band Time out: Immediately prior to procedure a "time out" was called to verify the correct patient, procedure, equipment, support staff and site/side marked as  required. Patient tolerance: patient tolerated the procedure well with no immediate complications Comments: No significant stool ball palpated, diarrhea/loose stool present       Medications Ordered in ED Medications  midodrine (PROAMATINE) tablet 20 mg (20 mg Oral Given 03/29/23 1709)  0.9 %  sodium chloride infusion (has no administration in time range)  norepinephrine (LEVOPHED) 4mg  in (0.016 mg/mL) premix infusion (6 mcg/min Intravenous Rate/Dose Change 03/29/23 1547)  ceFEPIme (MAXIPIME) 2 g in sodium chloride 0.9 % 100 mL IVPB (2 g Intravenous New Bag/Given 03/29/23 1706)  vancomycin (VANCOREADY) IVPB 1750 mg/350 mL (has no administration in time range)  lactated ringers bolus 500 mL (has no administration in time range)  lactated ringers bolus 250 mL (0 mLs Intravenous Stopped 03/29/23 0922)  lactated ringers bolus 250 mL (250 mLs Intravenous New Bag/Given 03/29/23 1038)  hydrocortisone sodium succinate (SOLU-CORTEF) 100 MG injection 100 mg (100 mg Intravenous Given 03/29/23 1152)  lactated ringers bolus 500 mL (500 mLs Intravenous New Bag/Given 03/29/23 1149)  iohexol (OMNIPAQUE) 350 MG/ML injection 75 mL (75 mLs Intravenous Contrast Given 03/29/23 1349)    ED Course/ Medical Decision Making/ A&P                                 Medical Decision Making Amount and/or Complexity of Data Reviewed Labs: ordered. Radiology: ordered.  Risk Prescription drug management. Decision  regarding hospitalization.      77 y.o. 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  The patient presented for his regular scheduled dialysis session this morning and was found to be hypotensive and was sent to the emergency department for  further evaluation.  He states that he was seen on 03/19/2023  for a similar complaint of hypotension during dialysis.  He states that his blood pressure is normally soft at about 105 systolic.   He denies any abdominal pain, nausea or vomiting.  He states that he has been tolerating oral intake and on my initial history of present illness the patient was eating appear resting comfortably in no distress.  He denies any chest pain or shortness of breath.  He does state that he has had left lower extremity swelling over the past 2 days which is new. No recent syncope or lightheadedness. No fevers, chills or cough.   He states that he did take his home Midodrine this morning at 0400 and even took an extra dose which didn't help. He denies any recent rectal bleeding.  On arrival, the patient was unstable, afebrile, tachycardic HR 130, BP initially 88/68, RR 16 in no respiratory distress, saturating 97% on room air.   Patient was initially fluid resuscitated with a 1 L LR bolus.  Considered a broad differential etiology of the patient's shock presentation, primarily hypovolemia in the setting of mild dehydration as the patient denied any infectious symptoms, was afebrile on arrival.  Considered obstructive shock and PE as the patient endorsed swelling in the leg with associated new onset hypotension.  Low concern for ACS.  Lower concern for septic shock without clear infectious etiology.  Patient's cholecystostomy was draining clear fluid with no evidence of purulence.  His abdomen was nontender on exam.  His lungs were clear to auscultation.  No evidence for fluid overload, hypervolemia with mild lower extremity edema noted that was slightly asymmetric.  Considered GI bleed given the patient's history of the same.  Fecal occult blood testing was collected with no evidence of melena or hematochezia, fecal occult was negative.  Labs: Lactic acid 1.6, occult blood testing negative, CMP with mild hypokalemia to 3.1,  serum creatinine elevated consistent with the patient's ESRD BUN mildly elevated at 36, CBC without a leukocytosis, mild anemia to 9.8, at the patient's baseline blood cultures x 2 collected and pending.  Lower extremity DVT ultrasound: Negative for DVT, evidence of edema present  CTA PE study and CT Abdomen Pelvis:  IMPRESSION:  Chest:    1. No evidence of pulmonary embolus.  2. Bibasilar hypoventilatory changes.  No acute airspace disease.  3. Aortic Atherosclerosis (ICD10-I70.0) and Emphysema (ICD10-J43.9).    Abdomen/pelvis:    1. New 3.5 cm rim enhancing fluid collection within the soft tissues  adjacent to the inferior margin of the left ischium, suspicious for  soft tissue abscess. No evidence of underlying osteomyelitis or  signs of overlying soft tissue ulceration.  2. Moderate retained stool within the rectal vault, consistent with  fecal impaction. Mild rectal wall thickening and perirectal fat  stranding could reflect superimposed stercoral colitis.  3. Stable indwelling cholecystostomy tube, with decompression of the  gallbladder. Stable calcified gallstones noted.  4. Findings consistent with known end-stage renal disease, with  diffuse  renal osteodystrophy again noted.  5.  Aortic Atherosclerosis (ICD10-I70.0).  6. 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    The patient had persistent hypotension despite home midodrine and initial fluid resuscitation.  Consider adrenal crisis, administered 100 mg of Solu-Cortef, administered his home midodrine.  The patient had persistent shock with maps in the 50s and was started on Levophed after his mental status declined with lower blood pressures.  He was maintained on Levophed 6 mcg/min.  He was notably on telemetry intermittently in and out  of atrial flutter with RVR.  Shock not administered due to patient rate intermittently being well-controlled after initial fluid resuscitation.  Given the finding of a rim-enhancing fluid collection, considered septic etiology of the patient's shock as soft tissue abscess was noted close to the left ischium, although pt not currently meeting SIRS criteria.  Possible retained stool in the rectal vault consistent with fecal impaction all the patient denies any pain or discomfort at this time. Given the patient's persistent shock presentation, PCCM was consulted for admission for further management. ABX ordered, discussed with PCCM IR consultation for source control, will defer to inpatient team. No evidence of fecal impaction on pt rectal exam, loose diarrhea palpated with no stool ball. Plan for likely continued further fluid resuscitation, potential weaning of Levophed for medicine admission, ultimate dispo per PCCM. Signout given to Dr. Rush Landmark at 1700.   Final Clinical Impression(s) / ED Diagnoses Final diagnoses:  Shock (HCC)  ESRD (end stage renal disease) on dialysis [facility])    Rx / DC Orders ED Discharge Orders     None         Ernie Avena, MD 03/29/23 1701    Ernie Avena, MD 03/29/23 1701    Ernie Avena, MD 03/29/23 1702    Ernie Avena, MD 03/29/23 1711

## 2023-03-29 NOTE — Progress Notes (Signed)
VASCULAR LAB    Bilateral lower extremity venous duplex has been performed.  See CV proc for preliminary results.  Messaged negative results to Dr. Karene Fry via secure chat  Rosezetta Schlatter, [facility], RVT 03/29/2023, 1:34 PM

## 2023-03-29 NOTE — ED Notes (Signed)
Pt admitted transported accompanied by RN and cardiac monitor to ICU.

## 2023-03-29 NOTE — ED Provider Notes (Signed)
5:31 PM Patient currently awaiting admission by critical care for hypotension felt to be related to infection.  IR called back to say that they would be happy to see in consultation to get an aspirate of this small abscess for cultures but they did not think it was amenable or large enough to need a drain nor did they think this was likely the cause of the patient's significant illness.  Consult was placed at their direction and patient is awaiting admission.   Justin [name], Justin Brim, MD 03/29/23 [phone]

## 2023-03-29 NOTE — Progress Notes (Signed)
ED Pharmacy Antibiotic Sign Off An antibiotic consult was received from an ED provider for cefepime and vancomycin per pharmacy dosing for sepsis. A chart review was completed to assess appropriateness.  The following one time order(s) were placed per pharmacy consult:  cefepime 2000 mg x 1 dose vancomycin 1750 mg x 1 dose  Further antibiotic and/or antibiotic pharmacy consults should be ordered by the admitting provider if indicated.   Thank you for allowing pharmacy to be a part of this patient's care.   Delmar Landau, PharmD, BCPS 03/29/2023 3:54 PM ED Clinical Pharmacist -  [phone]

## 2023-03-29 NOTE — Progress Notes (Signed)
An USGPIV (ultrasound guided PIV) has been placed for short-term vasopressor infusion. A correctly placed ivWatch must be used when administering Vasopressors. Should this treatment be needed beyond 24 hours, central line access should be obtained.  It will be the responsibility of the bedside nurse to follow best practice to prevent extravasations.

## 2023-03-29 NOTE — Progress Notes (Signed)
eLink Physician-Brief Progress Note Patient Name: ANTIONE BEHRNS DOB: 05/20/1946 MRN: [id number]   Date of Service  03/29/2023  HPI/Events of Note  72 M ESRD on HD via vascath right chest, DM, afib on apixaban, choleycystitis s/p perc drain (09/2022) presented to the ER due to hypotension during dialysis hence session not completed. Abdominal CT 3.5cm possible abscess of left ischium. Patient has been started on vancomycin and cefepime and IR consulted for possible draining of abscess.  Patient seen conversant, family at bedside On norepinephrine  eICU Interventions  Septic shock with possible abscess. Central line and per drain as possible sources as well Follow cultures K 3.1 not replaced. Repeat electrolytes tonight to include Mg and replace as needed Discussed with bedside RN if pressor requirement increased or persistent will need a central line. Monitor for signs of extravasation and refer accordingly     Intervention Category Evaluation Type: New Patient Evaluation  Darl Pikes 03/29/2023, 8:14 PM

## 2023-03-29 NOTE — H&P (Signed)
NAME:  Justin [name], MRN:  [id number], DOB:  1946/05/12, LOS: 0 ADMISSION DATE:  03/29/2023, CONSULTATION DATE:  03/29/23 REFERRING MD:  Karene Fry, CHIEF COMPLAINT:  hypotension   History of Present Illness:  Justin [name] is a 77 y.o. 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  He did denies any recent fever, chills, chest or abdominal pain.  He had a similar episode one week ago and was sent in from dialysis, his BP improved in the ED and he was discharged.  Pt states that his BP runs low chronically and he takes Midodrine 20mg  prior to dialysis.  He reports chronic diarrhea that is unchanged from baseline, but does feel like he hasn't been drinking as much fluid as he should.  He feels fatigued when his BP drops, but not dizzy or lightheaded    In the ED, he was given 1L IVF and started on levophed.  Work-up revealed a possible abscess in the L ischial area, 3.5cm fluid collection within the soft tissue.  Pt has not noted any significant pain or redness in the area.   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   Significant Hospital Events: Including procedures, antibiotic start and stop dates in addition to  other pertinent events   9/16 presented with hypotension from dialysis   Interim History / Subjective:   BP improved after 1L IVF  Objective   Blood pressure 110/70, pulse (!) 123, temperature 98.2 F (36.8 C), resp. rate 20, height 5\' 10"  (1.778 m), weight 82.6 kg, SpO2 97%.       No intake or output data in the 24 hours ending 03/29/23 1712 Filed Weights   03/29/23 0811  Weight: 82.6 kg    General:  thin, chronically ill-appearing M resting in bed in no distress  HEENT: MM pink/moist, sclera mildly injected Neuro: alert and oriented, baseline weakness in the bilateral LE, moves all extremities CV: s1s2 rrr, no m/r/g PULM:  clear bilaterally on room air without rhonchi or wheezing GI: soft, PERC drain in place  Extremities: warm/dry, RLE 1+ edema  Skin: no rashes or lesions, No obvious abscess in the L ischial area    Resolved Hospital Problem list     Assessment & Plan:    Acute on Chronic Hypotension, concern for septic shock No leukocytosis or lactic acidosis, pt is afebrile, suspect that he is hypovolemic  There is a 3.5cm fluid collection concerning for abscess on CT -BP improved with IVF 1.5L total and albumin 50g -continue empiric Vanc/Cefepime for now, suspect that this can be de-escalated quickly -IR consult for possible abscess  -continue levophed for MAP >65 -follow blood cultures -continue Midodrine   ESRD  Hypokalemia Was not dialyzed 9/16 -will need inpatient nephrology consult  -follow electrolytes and replete prn -continue Renvela   Atrial Fibrillation HL -continue amiodarone and statin     Type 2 DM No home medications  -  SSI     Best Practice (right click and "Reselect all SmartList Selections" daily)   Diet/type: Regular consistency (see orders) DVT prophylaxis: prophylactic heparin  GI prophylaxis: N/A Lines: N/A Foley:  N/A Code Status:  DNR Last date of multidisciplinary goals of care discussion [DNR confirmed  9/16]  Labs   CBC: Recent Labs  Lab 03/29/23 0847  WBC 8.3  NEUTROABS 6.2  HGB 9.8*  HCT 32.5*  MCV 100.9*  PLT 166    Basic Metabolic Panel: Recent Labs  Lab 03/29/23 0847  NA 140  K 3.1*  CL 101  CO2 27  GLUCOSE 129*  BUN 36*  CREATININE 9.26*  CALCIUM 8.6*   GFR: Estimated Creatinine Clearance: 6.8 mL/min (A) (by C-G formula based on SCr of 9.26 mg/dL (H)). Recent Labs  Lab 03/29/23 0847 03/29/23 0858  WBC 8.3  --   LATICACIDVEN  --  1.6    Liver Function Tests: Recent Labs  Lab 03/29/23 0847  AST 17  ALT 15  ALKPHOS 87  BILITOT 0.6  PROT 6.9  ALBUMIN 2.5*   No results for input(s): "LIPASE", "AMYLASE" in the last 168 hours. No results for input(s): "AMMONIA" in the last 168 hours.  ABG    Component Value Date/Time   PHART 7.301 (L) 11/09/2022 0259   PCO2ART 57.6 (H) 11/09/2022 0259   PO2ART 109 (H) 11/09/2022 0259   HCO3 28.4 (H) 11/09/2022 0259   TCO2 35 (H) 12/13/2022 2353   O2SAT 98 11/09/2022 0259     Coagulation Profile: No results for input(s): "INR", "PROTIME" in the last 168 hours.  Cardiac Enzymes: No results for input(s): "CKTOTAL", "CKMB", "CKMBINDEX", "TROPONINI" in the last 168 hours.  HbA1C: Hgb A1c MFr Bld  Date/Time Value Ref Range Status  12/14/2022 10:37 AM 5.1 4.8 - 5.6 % Final    Comment:    (NOTE)         Prediabetes: 5.7 - 6.4         Diabetes: >6.4         Glycemic control for adults with diabetes: <7.0   06/16/2022 03:55 AM 6.5 (H) 4.8 - 5.6 % Final    Comment:    (NOTE)         Prediabetes: 5.7 - 6.4         Diabetes: >6.4         Glycemic control for adults with diabetes: <7.0     CBG: No results for input(s): "GLUCAP" in the last 168 hours.  Review of Systems:   Please see the history of present illness. 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   Surgical History:   Past Surgical History:  Procedure Laterality Date   A/V FISTULAGRAM Left 08/17/2017   Procedure: A/V FISTULAGRAM;  Surgeon: Renford Dills, MD;  Location: [facility] INVASIVE CV LAB;  Service: Cardiovascular;  Laterality: Left;   A/V SHUNT INTERVENTION N/A 08/17/2017   Procedure: A/V SHUNT INTERVENTION;  Surgeon: Renford Dills, MD;  Location: [facility] INVASIVE CV LAB;  Service: Cardiovascular;  Laterality: N/A;   AV FISTULA PLACEMENT Left 05/31/2015   Procedure: ARTERIOVENOUS (AV) FISTULA CREATION;  Surgeon: Renford Dills, MD;  Location: [facility] ORS;  Service: Vascular;  Laterality: Left;   COLONOSCOPY N/A 12/17/2022   Procedure: COLONOSCOPY;  Surgeon: Napoleon Form, MD;  Location: [facility] ENDOSCOPY;  Service: Gastroenterology;  Laterality: N/A;   COLONOSCOPY WITH PROPOFOL N/A 09/11/2022   Procedure: COLONOSCOPY WITH PROPOFOL;  Surgeon: Imogene Burn, MD;  Location: [facility] ENDOSCOPY;  Service: Gastroenterology;  Laterality: N/A;   COLONOSCOPY WITH PROPOFOL N/A 09/17/2022   Procedure: COLONOSCOPY WITH PROPOFOL;  Surgeon: Benancio Deeds, MD;  Location: [facility] ENDOSCOPY;  Service: Gastroenterology;  Laterality: N/A;   ESOPHAGOGASTRODUODENOSCOPY (EGD) WITH PROPOFOL N/A 09/11/2022   Procedure: ESOPHAGOGASTRODUODENOSCOPY (EGD) WITH PROPOFOL;  Surgeon: Imogene Burn, MD;  Location: [facility] ENDOSCOPY;  Service: Gastroenterology;  Laterality: N/A;    ESOPHAGOGASTRODUODENOSCOPY (EGD) WITH PROPOFOL N/A 12/17/2022   Procedure: ESOPHAGOGASTRODUODENOSCOPY (EGD) WITH PROPOFOL;  Surgeon: Napoleon Form, MD;  Location: [facility] ENDOSCOPY;  Service: Gastroenterology;  Laterality: N/A;   excision bone spurs Bilateral 1989   feet   GIVENS CAPSULE STUDY N/A 11/10/2022   Procedure: GIVENS CAPSULE STUDY;  Surgeon: Shellia Cleverly, DO;  Location: [facility] ENDOSCOPY;  Service: Gastroenterology;  Laterality: N/A;   HEMOSTASIS CLIP PLACEMENT  09/11/2022   Procedure: HEMOSTASIS CLIP PLACEMENT;  Surgeon: Imogene Burn, MD;  Location: [facility] ENDOSCOPY;  Service: Gastroenterology;;   HEMOSTASIS CLIP PLACEMENT  09/17/2022   Procedure: HEMOSTASIS CLIP PLACEMENT;  Surgeon: Benancio Deeds, MD;  Location: [facility] ENDOSCOPY;  Service: Gastroenterology;;   HEMOSTASIS CLIP PLACEMENT  12/17/2022   Procedure: HEMOSTASIS CLIP PLACEMENT;  Surgeon: Napoleon Form, MD;  Location: [facility] ENDOSCOPY;  Service: Gastroenterology;;   HOT HEMOSTASIS N/A 09/11/2022   Procedure: HOT HEMOSTASIS (ARGON PLASMA COAGULATION/BICAP);  Surgeon: Imogene Burn, MD;  Location: [facility] ENDOSCOPY;  Service: Gastroenterology;  Laterality: N/A;   HOT HEMOSTASIS N/A 09/17/2022   Procedure: HOT HEMOSTASIS (ARGON PLASMA COAGULATION/BICAP);  Surgeon: Benancio Deeds, MD;  Location: [facility] ENDOSCOPY;  Service: Gastroenterology;  Laterality: N/A;   INCISION AND DRAINAGE PERIRECTAL ABSCESS N/A 06/15/2022   Procedure: IRRIGATION AND DEBRIDEMENT PERIRECTAL ABSCESS;  Surgeon: Franky Macho, MD;  Location: [facility] ORS;  Service: General;  Laterality: N/A;   IR EXCHANGE BILIARY DRAIN  03/16/2023   KNEE SURGERY Left 1998   arthroscopy   POLYPECTOMY  09/11/2022   Procedure: POLYPECTOMY;  Surgeon: Imogene Burn, MD;  Location: [facility] ENDOSCOPY;  Service: Gastroenterology;;   SHOULDER SURGERY Left 1994   rotator cuff     Social History:   reports that he quit smoking about 44 years ago. His smoking use included cigarettes. He has never used  smokeless tobacco. He reports that he does not drink alcohol and does not use drugs.   Family History:  His family history includes Alcoholism in his brother and father; Kidney disease in his mother. There is no history of Colon cancer, Stomach cancer, or Esophageal cancer.   Allergies No Known Allergies   Home Medications  Prior to Admission medications   Medication Sig Start Date End Date Taking? Authorizing Provider  acetaminophen (TYLENOL) 325 MG tablet Take 2 tablets (650 mg total) by mouth every 6 (six) hours as needed for mild pain (or Fever >/= 101). 11/24/22  Yes Pokhrel, Laxman, MD  atorvastatin (LIPITOR) 40 MG tablet Take 40 mg by mouth at bedtime. 10/30/14  Yes [provider]  calcitRIOL (ROCALTROL)  0.25 MCG capsule Take 3 capsules (0.75 mcg total) by mouth every Monday, Wednesday, and Friday with hemodialysis. Patient taking differently: Take 0.25 mcg by mouth daily. 11/25/22  Yes Pokhrel, Laxman, MD  midodrine (PROAMATINE) 10 MG tablet Take 2 tablets (20 mg total) by mouth every 8 (eight) hours. 01/02/23  Yes Narda Bonds, MD  pantoprazole (PROTONIX) 40 MG tablet Take 1 tablet (40 mg total) by mouth 2 (two) times daily before a meal. 11/24/22  Yes Pokhrel, Laxman, MD  pregabalin (LYRICA) 75 MG capsule Take 1 capsule (75 mg total) by mouth at bedtime. 11/24/22 11/24/23 Yes Pokhrel, Rebekah Chesterfield, MD  amiodarone (PACERONE) 200 MG tablet Take 1 tablet (200 mg total) by mouth 2 (two) times daily for 4 days, THEN 1 tablet (200 mg total) daily. Patient not taking: Reported on 03/29/2023 01/02/23 02/05/23  Narda Bonds, MD  b complex-vitamin c-folic acid (NEPHRO-VITE) 0.8 MG TABS tablet Take 1 tablet by mouth at bedtime. Patient not taking: Reported on 03/29/2023    [provider]  cinacalcet (SENSIPAR) 30 MG tablet Take 30 mg by mouth daily. Patient not taking: Reported on 03/29/2023 08/25/22   [provider]  Olopatadine HCl (PATADAY) 0.2 % SOLN Place 1 drop into both  eyes daily. Patient not taking: Reported on 03/29/2023    [provider]  sevelamer carbonate (RENVELA) 800 MG tablet Take 800 mg by mouth 2 (two) times daily. Patient not taking: Reported on 03/29/2023    [provider]  traZODone (DESYREL) 50 MG tablet Take 1 tablet (50 mg total) by mouth at bedtime as needed for sleep. Patient not taking: Reported on 03/29/2023 11/24/22   Joycelyn Das, MD     Critical care time:  45 minutes     CRITICAL CARE Performed by: Darcella Gasman Emmajo Bennette   Total critical care time: 45 minutes  Critical care time was exclusive of separately billable procedures and treating other patients.  Critical care was necessary to treat or prevent imminent or life-threatening deterioration.  Critical care was time spent personally by me on the following activities: development of treatment plan with patient and/or surrogate as well as nursing, discussions with consultants, evaluation of patient's response to treatment, examination of patient, obtaining history from patient or surrogate, ordering and performing treatments and interventions, ordering and review of laboratory studies, ordering and review of radiographic studies, pulse oximetry and re-evaluation of patient's condition.   Darcella Gasman Jordon Bourquin, PA-C [facility] See Amion for pager If no response to pager , please call [phone] until 7pm After 7:00 pm call Elink  [phone]

## 2023-03-29 NOTE — ED Triage Notes (Signed)
Patient arrives by EMS from dialysis center d/t hypotension.    Patient alert and oriented.  Patient reports headache behind left eye, that patient reports started at 0530.

## 2023-03-30 ENCOUNTER — Encounter (HOSPITAL_COMMUNITY): Payer: Self-pay | Admitting: Pulmonary Disease

## 2023-03-30 ENCOUNTER — Inpatient Hospital Stay (HOSPITAL_COMMUNITY): Payer: Medicare Other

## 2023-03-30 DIAGNOSIS — A419 Sepsis, unspecified organism: Secondary | ICD-10-CM

## 2023-03-30 DIAGNOSIS — R6521 Severe sepsis with septic shock: Secondary | ICD-10-CM | POA: Diagnosis not present

## 2023-03-30 LAB — BASIC METABOLIC PANEL
Anion gap: 12 (ref 5–15)
BUN: 40 mg/dL — ABNORMAL HIGH (ref 8–23)
CO2: 26 mmol/L (ref 22–32)
Calcium: 8.8 mg/dL — ABNORMAL LOW (ref 8.9–10.3)
Chloride: 97 mmol/L — ABNORMAL LOW (ref 98–111)
Creatinine, Ser: 9.65 mg/dL — ABNORMAL HIGH (ref 0.61–1.24)
GFR, Estimated: 5 mL/min — ABNORMAL LOW (ref >60)
Glucose, Bld: 216 mg/dL — ABNORMAL HIGH (ref 70–99)
Potassium: 3.5 mmol/L (ref 3.5–5.1)
Sodium: 135 mmol/L (ref 135–145)

## 2023-03-30 LAB — CBC
HCT: 29.7 % — ABNORMAL LOW (ref 39.0–52.0)
Hemoglobin: 9.1 g/dL — ABNORMAL LOW (ref 13.0–17.0)
MCH: 30.1 pg (ref 26.0–34.0)
MCHC: 30.6 g/dL (ref 30.0–36.0)
MCV: 98.3 fL (ref 80.0–100.0)
Platelets: 186 10*3/uL (ref 150–400)
RBC: 3.02 MIL/uL — ABNORMAL LOW (ref 4.22–5.81)
RDW: 17 % — ABNORMAL HIGH (ref 11.5–15.5)
WBC: 8.5 10*3/uL (ref 4.0–10.5)
nRBC: 0 % (ref 0.0–0.2)

## 2023-03-30 LAB — PROCALCITONIN: Procalcitonin: 0.24 ng/mL

## 2023-03-30 LAB — GLUCOSE, CAPILLARY
Glucose-Capillary: 130 mg/dL — ABNORMAL HIGH (ref 70–99)
Glucose-Capillary: 127 mg/dL — ABNORMAL HIGH (ref 70–99)
Glucose-Capillary: 157 mg/dL — ABNORMAL HIGH (ref 70–99)

## 2023-03-30 LAB — MAGNESIUM: Magnesium: 2.1 mg/dL (ref 1.7–2.4)

## 2023-03-30 LAB — PHOSPHORUS: Phosphorus: 6.5 mg/dL — ABNORMAL HIGH (ref 2.5–4.6)

## 2023-03-30 LAB — HEPATITIS B SURFACE ANTIGEN: Hepatitis B Surface Ag: NONREACTIVE

## 2023-03-30 MED ORDER — PIPERACILLIN-TAZOBACTAM IN DEX 2-0.25 GM/50ML IV SOLN
2.2500 g | Freq: Three times a day (TID) | INTRAVENOUS | Status: DC
  Administered 2023-03-30 – 2023-04-05 (×18): 2.25 g via INTRAVENOUS
  Filled 2023-03-30 (×20): qty 50

## 2023-03-30 MED ORDER — INSULIN ASPART 100 UNIT/ML IJ SOLN
0.0000 [IU] | INTRAMUSCULAR | Status: DC
  Administered 2023-03-30 (×2): 1 [IU] via SUBCUTANEOUS

## 2023-03-30 MED ORDER — VANCOMYCIN HCL 1750 MG/350ML IV SOLN
1750.0000 mg | Freq: Once | INTRAVENOUS | Status: DC
  Filled 2023-03-30: qty 350

## 2023-03-30 MED ORDER — HEPARIN SODIUM (PORCINE) 5000 UNIT/ML IJ SOLN
5000.0000 [IU] | Freq: Three times a day (TID) | INTRAMUSCULAR | Status: DC
  Administered 2023-03-31 – 2023-04-08 (×25): 5000 [IU] via SUBCUTANEOUS
  Filled 2023-03-30 (×25): qty 1

## 2023-03-30 MED ORDER — CHLORHEXIDINE GLUCONATE CLOTH 2 % EX PADS: 6.0000 | MEDICATED_PAD | Freq: Every day | CUTANEOUS | Status: DC

## 2023-03-30 MED ORDER — LIDOCAINE HCL 1 % IJ SOLN
10.0000 mL | Freq: Once | INTRAMUSCULAR | Status: AC
  Administered 2023-03-30: 10 mL via INTRADERMAL
  Filled 2023-03-30: qty 10

## 2023-03-30 NOTE — Progress Notes (Signed)
eLink Physician-Brief Progress Note Patient Name: Justin [name] DOB: Aug 22, 1945 MRN: [id number]   Date of Service  03/30/2023  HPI/Events of Note  Received request for clarification regarding code status. His code status was DNR with interventions but was changed to DNR comfort only after IR drain of abscess. On record patient still receiving interventions with no communication that he would want comfort measures only.  eICU Interventions  Will revert back to original code status Discussed with bedside RN This is to be clarified as well with bedside multidisciplinary rounds in AM     Intervention Category Intermediate Interventions: Other:;Communication with other healthcare providers and/or family  Darl Pikes 03/30/2023, 8:31 PM

## 2023-03-30 NOTE — Progress Notes (Addendum)
NAME:  Justin [name], MRN:  [id number], DOB:  July 08, 1946, LOS: 1 ADMISSION DATE:  03/29/2023, CONSULTATION DATE:  03/30/23 REFERRING MD:  Karene Fry, CHIEF COMPLAINT:  hypotension   History of Present Illness:  Justin [name] is a 77 y.o. 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  He did denies any recent fever, chills, chest or abdominal pain.  He had a similar episode one week ago and was sent in from dialysis, his BP improved in the ED and he was discharged.  Pt states that his BP runs low chronically and he takes Midodrine 20mg  prior to dialysis.  He reports chronic diarrhea that is unchanged from baseline, but does feel like he hasn't been drinking as much fluid as he should.  He feels fatigued when his BP drops, but not dizzy or lightheaded   In the ED, he was given 1L IVF and started on levophed.  Work-up revealed a possible abscess in the L ischial area, 3.5cm fluid collection within the soft tissue.  Pt has not noted any significant pain or redness in the area.   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   Significant Hospital Events: Including procedures, antibiotic start and stop dates in addition to  other pertinent events   9/16 presented with hypotension from dialysis   Interim History / Subjective:  On 9 mcg levo today AO; no complaints  Objective   Blood pressure 113/60, pulse 64, temperature 98.2 F (36.8 C), temperature source Oral, resp. rate 11, height 5\' 10"  (1.778 m), weight 85.7 kg, SpO2 100%.        Intake/Output Summary (Last 24 hours) at 03/30/2023 0714 Last data filed at 03/29/2023 2152 Gross per 24 hour  Intake 786.17 ml  Output --  Net 786.17 ml   Filed Weights   03/29/23 0811 03/29/23 2030 03/30/23 0500  Weight: 82.6 kg 83 kg 85.7 kg    General: NAD HEENT: MM pink/dry Neuro: Aox3; MAE CV: s1s2, irregularly irregular rate 80s, no m/r/g PULM:  dim clear BS bilaterally GI: soft, bsx4 active  Extremities: warm/dry, no edema  Skin: no rashes or lesions   Resolved Hospital Problem list     Assessment & Plan:    Acute on Chronic Hypotension Shock: likely septic with abscess vs. Hypovolemic given chronic diarrhea and decreased po intake P: -cont levo for map goal >60 -IR consulted; plan to take down this am for abscess drainage -start zosyn and dc vanc; will check pct and consider dc'ing -follow cultures -trend wbc/fever curve -cont midodrine  ESRD  Hypokalemia Was not dialyzed 9/16 P: -HD per nephro -Trend BMP / urinary output -Replace electrolytes as indicated -Avoid nephrotoxic agents, ensure adequate renal perfusion  Atrial Fibrillation: not on AC given recent GI bleed HLD P: -cont amio and statin  Type 2 DM No home medications  P: -SSI and cbg monitoring -a1c  Chronic anemia P: -hgb stable -  trend cbc  Best Practice (right click and "Reselect all SmartList Selections" daily)   Diet/type: Regular consistency (see orders) DVT prophylaxis: prophylactic heparin  GI prophylaxis: PPI Lines: Dialysis Catheter tunneled Foley:  N/A Code Status:  DNR Last date of multidisciplinary goals of care discussion [facility] confirmed  9/16]  Labs   CBC: Recent Labs  Lab 03/29/23 0847 03/30/23 0251  WBC 8.3 8.5  NEUTROABS 6.2  --   HGB 9.8* 9.1*  HCT 32.5* 29.7*  MCV 100.9* 98.3  PLT 166 186    Basic Metabolic Panel: Recent Labs  Lab 03/29/23 0847 03/29/23 2120 03/30/23 0251  NA 140 138 135  K 3.1* 3.6 3.5  CL 101 98 97*  CO2 27 24 26   GLUCOSE 129* 194* 216*  BUN 36* 39* 40*  CREATININE 9.26* 9.38* 9.65*  CALCIUM 8.6* 9.2 8.8*  MG  --  2.2 2.1  PHOS  --   --  6.5*   GFR: Estimated Creatinine Clearance: 6.5 mL/min (A) (by C-G formula based on SCr of 9.65 mg/dL (H)). Recent Labs  Lab 03/29/23 0847 03/29/23 0858 03/30/23 0251  WBC 8.3  --  8.5  LATICACIDVEN  --  1.6  --     Liver Function Tests: Recent Labs  Lab 03/29/23 0847  AST 17  ALT 15  ALKPHOS 87  BILITOT 0.6  PROT 6.9  ALBUMIN 2.5*   No results for input(s): "LIPASE", "AMYLASE" in the last 168 hours. No results for input(s): "AMMONIA" in the last 168 hours.  ABG    Component Value Date/Time   PHART 7.301 (L) 11/09/2022 0259   PCO2ART 57.6 (H) 11/09/2022 0259   PO2ART 109 (H) 11/09/2022 0259   HCO3 28.4 (H) 11/09/2022 0259   TCO2 35 (H) 12/13/2022 2353   O2SAT 98 11/09/2022 0259     Coagulation Profile: No results for input(s): "INR", "PROTIME" in the last 168 hours.  Cardiac Enzymes: No results for input(s): "CKTOTAL", "CKMB", "CKMBINDEX", "TROPONINI" in the last 168 hours.  HbA1C: Hgb A1c MFr Bld  Date/Time Value Ref Range Status  12/14/2022 10:37 AM 5.1 4.8 - 5.6 % Final    Comment:    (NOTE)         Prediabetes: 5.7 - 6.4         Diabetes: >6.4         Glycemic control for adults with diabetes: <7.0   06/16/2022 03:55 AM 6.5 (H) 4.8 - 5.6 % Final    Comment:    (NOTE)         Prediabetes: 5.7 - 6.4         Diabetes: >6.4         Glycemic control for adults with diabetes: <7.0     CBG: Recent Labs  Lab 03/29/23 2002  GLUCAP 169*    Review of Systems:   Please see the history of present  illness. 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   Surgical History:   Past Surgical History:  Procedure Laterality Date   A/V FISTULAGRAM Left 08/17/2017   Procedure: A/V FISTULAGRAM;  Surgeon: Renford Dills, MD;  Location: [facility] INVASIVE CV LAB;  Service: Cardiovascular;  Laterality: Left;   A/V SHUNT INTERVENTION N/A 08/17/2017   Procedure: A/V SHUNT INTERVENTION;  Surgeon: Renford Dills, MD;  Location: [facility] INVASIVE CV LAB;  Service: Cardiovascular;  Laterality: N/A;   AV FISTULA PLACEMENT Left 05/31/2015   Procedure: ARTERIOVENOUS (AV) FISTULA CREATION;  Surgeon: Renford Dills, MD;  Location: [facility] ORS;  Service: Vascular;  Laterality: Left;   COLONOSCOPY N/A 12/17/2022   Procedure: COLONOSCOPY;  Surgeon: Napoleon Form, MD;  Location: [facility] ENDOSCOPY;  Service: Gastroenterology;  Laterality: N/A;   COLONOSCOPY WITH PROPOFOL N/A 09/11/2022   Procedure: COLONOSCOPY WITH PROPOFOL;  Surgeon: Imogene Burn, MD;  Location[facility] ENDOSCOPY;  Service: Gastroenterology;  Laterality: N/A;   COLONOSCOPY WITH PROPOFOL N/A 09/17/2022   Procedure: COLONOSCOPY WITH PROPOFOL;  Surgeon: Benancio Deeds, MD;  Location: [facility] ENDOSCOPY;  Service: Gastroenterology;  Laterality: N/A;   ESOPHAGOGASTRODUODENOSCOPY (EGD) WITH  PROPOFOL N/A 09/11/2022   Procedure: ESOPHAGOGASTRODUODENOSCOPY (EGD) WITH PROPOFOL;  Surgeon: Imogene Burn, MD;  Location: [facility] ENDOSCOPY;  Service: Gastroenterology;  Laterality: N/A;   ESOPHAGOGASTRODUODENOSCOPY (EGD) WITH PROPOFOL N/A 12/17/2022   Procedure: ESOPHAGOGASTRODUODENOSCOPY (EGD) WITH PROPOFOL;  Surgeon: Napoleon Form, MD;  Location: [facility] ENDOSCOPY;  Service: Gastroenterology;  Laterality: N/A;   excision bone spurs Bilateral 1989   feet   GIVENS CAPSULE STUDY N/A 11/10/2022   Procedure: GIVENS CAPSULE STUDY;  Surgeon: Shellia Cleverly, DO;  Location: [facility] ENDOSCOPY;  Service: Gastroenterology;  Laterality: N/A;   HEMOSTASIS CLIP PLACEMENT  09/11/2022   Procedure: HEMOSTASIS CLIP PLACEMENT;  Surgeon: Imogene Burn, MD;  Location: [facility] ENDOSCOPY;  Service: Gastroenterology;;   HEMOSTASIS CLIP PLACEMENT  09/17/2022   Procedure: HEMOSTASIS CLIP PLACEMENT;  Surgeon: Benancio Deeds, MD;  Location: [facility] ENDOSCOPY;  Service: Gastroenterology;;   HEMOSTASIS CLIP PLACEMENT  12/17/2022   Procedure: HEMOSTASIS CLIP PLACEMENT;  Surgeon: Napoleon Form, MD;  Location: [facility] ENDOSCOPY;  Service: Gastroenterology;;   HOT HEMOSTASIS N/A 09/11/2022   Procedure: HOT HEMOSTASIS (ARGON PLASMA COAGULATION/BICAP);  Surgeon: Imogene Burn, MD;  Location: [facility] ENDOSCOPY;  Service: Gastroenterology;  Laterality: N/A;   HOT HEMOSTASIS N/A 09/17/2022   Procedure: HOT HEMOSTASIS (ARGON PLASMA COAGULATION/BICAP);  Surgeon: Benancio Deeds, MD;  Location: [facility] ENDOSCOPY;  Service: Gastroenterology;  Laterality: N/A;   INCISION AND DRAINAGE PERIRECTAL ABSCESS N/A 06/15/2022   Procedure: IRRIGATION AND DEBRIDEMENT PERIRECTAL ABSCESS;  Surgeon: Franky Macho, MD;  Location: [facility] ORS;  Service: General;  Laterality: N/A;   IR EXCHANGE BILIARY DRAIN  03/16/2023   KNEE SURGERY Left 1998   arthroscopy   POLYPECTOMY  09/11/2022   Procedure: POLYPECTOMY;  Surgeon: Imogene Burn, MD;  Location: [facility] ENDOSCOPY;  Service:  Gastroenterology;;   SHOULDER SURGERY Left 1994   rotator cuff     Social History:   reports that he quit smoking about 44 years ago. His smoking use included cigarettes. He has never used smokeless tobacco. He reports that he does not drink alcohol and does not use drugs.   Family History:  His family history includes Alcoholism in his brother and father; Kidney disease in his mother. There is no history of Colon cancer, Stomach cancer, or Esophageal cancer.   Allergies No Known Allergies   Home Medications  Prior to Admission medications   Medication Sig Start Date End Date Taking? Authorizing Provider  acetaminophen (TYLENOL)  325 MG tablet Take 2 tablets (650 mg total) by mouth every 6 (six) hours as needed for mild pain (or Fever >/= 101). 11/24/22  Yes Pokhrel, Laxman, MD  atorvastatin (LIPITOR) 40 MG tablet Take 40 mg by mouth at bedtime. 10/30/14  Yes [provider]  calcitRIOL (ROCALTROL) 0.25 MCG capsule Take 3 capsules (0.75 mcg total) by mouth every Monday, Wednesday, and Friday with hemodialysis. Patient taking differently: Take 0.25 mcg by mouth daily. 11/25/22  Yes Pokhrel, Laxman, MD  midodrine (PROAMATINE) 10 MG tablet Take 2 tablets (20 mg total) by mouth every 8 (eight) hours. 01/02/23  Yes Narda Bonds, MD  pantoprazole (PROTONIX) 40 MG tablet Take 1 tablet (40 mg total) by mouth 2 (two) times daily before a meal. 11/24/22  Yes Pokhrel, Laxman, MD  pregabalin (LYRICA) 75 MG capsule Take 1 capsule (75 mg total) by mouth at bedtime. 11/24/22 11/24/23 Yes Pokhrel, Rebekah Chesterfield, MD  amiodarone (PACERONE) 200 MG tablet Take 1 tablet (200 mg total) by mouth 2 (two) times daily for 4 days, THEN 1 tablet (200 mg total) daily. Patient not taking: Reported on 03/29/2023 01/02/23 02/05/23  Narda Bonds, MD  b complex-vitamin c-folic acid (NEPHRO-VITE) 0.8 MG TABS tablet Take 1 tablet by mouth at bedtime. Patient not taking: Reported on 03/29/2023    [provider]   cinacalcet (SENSIPAR) 30 MG tablet Take 30 mg by mouth daily. Patient not taking: Reported on 03/29/2023 08/25/22   [provider]  Olopatadine HCl (PATADAY) 0.2 % SOLN Place 1 drop into both eyes daily. Patient not taking: Reported on 03/29/2023    [provider]  sevelamer carbonate (RENVELA) 800 MG tablet Take 800 mg by mouth 2 (two) times daily. Patient not taking: Reported on 03/29/2023    [provider]  traZODone (DESYREL) 50 MG tablet Take 1 tablet (50 mg total) by mouth at bedtime as needed for sleep. Patient not taking: Reported on 03/29/2023 11/24/22   Joycelyn Das, MD     Critical care time:  35 minutes     JD Anselm [facility] 03/30/2023, 7:59 AM  Please see [email or web address] for pager details.  From 7A-7P if no response, please call [phone]. After hours, please call ELink [phone].

## 2023-03-30 NOTE — Procedures (Signed)
Vascular and Interventional Radiology Procedure Note  Patient: Justin [name] DOB: [birth year] Medical Record Number: [id number] Note Date/Time: 03/30/23 9:51 AM   Performing Physician: Roanna Banning, MD Assistant(s): None  Diagnosis: Sepsis. L ischial ST abscess  Procedure: ASPIRATION OF LEFT ISCHIAL ABSCESS  Anesthesia: Local Anesthetic Complications: None Estimated Blood Loss: Minimal Specimens: Sent for Gram Stain, Aerobe Culture, and Anerobe Culture  Findings:  Successful CT-guided aspiration of small L ischial abscess. No significant return. Lavage performed and sample submitted to microbiology for analysis.   See detailed procedure note with images in PACS. The patient tolerated the procedure well without incident or complication and was returned to Recovery in stable condition.    Roanna Banning, MD [facility] Radiology   Pager. [phone] Clinic. [phone]

## 2023-03-30 NOTE — Progress Notes (Addendum)
Pharmacy Antibiotic Note  Justin [name] is a 77 y.o. male admitted on 03/29/2023 with hypotension and abscess.  Pharmacy has been consulted for Zosyn dosing.  Did not receive 1 time dose of vancomycin on 9/16 while in emergency department  ESRD on HD MWF at baseline - HD schedule inpatient to be determined  Plan: Zosyn 2.25g Q8H  Height: 5\' 10"  (177.8 cm) Weight: 85.7 kg (188 lb 15 oz) IBW/kg (Calculated) : 73  Temp (24hrs), Avg:98 F (36.7 C), Min:97.4 F (36.3 C), Max:98.2 F (36.8 C)  Recent Labs  Lab 03/29/23 0847 03/29/23 0858 03/29/23 2120 03/30/23 0251  WBC 8.3  --   --  8.5  CREATININE 9.26*  --  9.38* 9.65*  LATICACIDVEN  --  1.6  --   --     Estimated Creatinine Clearance: 6.5 mL/min (A) (by C-G formula based on SCr of 9.65 mg/dL (H)).    No Known Allergies  Eldridge Scot, PharmD, BCCCP Clinical Pharmacist 03/30/2023, 8:02 AM

## 2023-03-30 NOTE — Progress Notes (Signed)
Pt refusing blood sugar check and temperature for 9/18 at 0000. Pt educated on importance of interventions.

## 2023-03-30 NOTE — Plan of Care (Signed)

## 2023-03-30 NOTE — Consult Note (Signed)
Renal Service Consult Note [facility]  Justin [name] 03/30/2023 [name], MD Requesting Physician: Dr. Craige Cotta  Reason for Consult: ESRD pt w/ hypotension HPI: The patient is a 77 y.o. year-old w/ PMH as below who presented to ED from dialysis center yesterday 9/16 for hypotension. Pt has chronic diarrhea. In ED CT showed a 3-4 cm fluid collection in the pelvic area. Pt was felt to have sepsis and was given 1.5 L IVF"s and IV abx were started. Pt was admitted to ICU. Vasopressors were started. We are asked to see for dialysis.   Pt seen in ICU.  Pt is comfortable, no current c/o's.    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) 12/07/2019   Dialysis patient (HCC)    Gout    Hypercalcemia 11/14/2021   Hyperkalemia 10/19/2018   Hypertension    Hypotension 06/13/2022   Right posterior capsular opacification 02/27/2021   Septic shock (HCC)    Vitreous hemorrhage of right eye (HCC) 10/24/2019   Past Surgical History  Past Surgical History:  Procedure Laterality Date   A/V FISTULAGRAM Left 08/17/2017   Procedure: A/V FISTULAGRAM;  Surgeon: Renford Dills, MD;  Location: [facility] INVASIVE CV LAB;  Service: Cardiovascular;  Laterality: Left;   A/V SHUNT INTERVENTION N/A 08/17/2017   Procedure: A/V SHUNT INTERVENTION;  Surgeon: Renford Dills, MD;  Location: [facility] INVASIVE CV LAB;  Service: Cardiovascular;  Laterality: N/A;   AV FISTULA PLACEMENT Left  05/31/2015   Procedure: ARTERIOVENOUS (AV) FISTULA CREATION;  Surgeon: Renford Dills, MD;  Location: [facility] ORS;  Service: Vascular;  Laterality: Left;   COLONOSCOPY N/A 12/17/2022   Procedure: COLONOSCOPY;  Surgeon: Napoleon Form, MD;  Location: [facility] ENDOSCOPY;  Service: Gastroenterology;  Laterality: N/A;   COLONOSCOPY WITH PROPOFOL N/A 09/11/2022   Procedure: COLONOSCOPY WITH PROPOFOL;  Surgeon: Imogene Burn, MD;  Location: [facility] ENDOSCOPY;  Service: Gastroenterology;  Laterality: N/A;   COLONOSCOPY WITH PROPOFOL N/A 09/17/2022   Procedure: COLONOSCOPY WITH PROPOFOL;  Surgeon: Benancio Deeds, MD;  Location: [facility] ENDOSCOPY;  Service: Gastroenterology;  Laterality: N/A;   ESOPHAGOGASTRODUODENOSCOPY (EGD) WITH PROPOFOL N/A 09/11/2022   Procedure: ESOPHAGOGASTRODUODENOSCOPY (EGD) WITH PROPOFOL;  Surgeon: Imogene Burn, MD;  Location: [facility] ENDOSCOPY;  Service: Gastroenterology;  Laterality: N/A;   ESOPHAGOGASTRODUODENOSCOPY (EGD) WITH PROPOFOL N/A 12/17/2022   Procedure: ESOPHAGOGASTRODUODENOSCOPY (EGD) WITH PROPOFOL;  Surgeon: Napoleon Form, MD;  Location: [facility] ENDOSCOPY;  Service: Gastroenterology;  Laterality: N/A;   excision bone spurs Bilateral 1989   feet   GIVENS CAPSULE STUDY N/A 11/10/2022   Procedure: GIVENS CAPSULE STUDY;  Surgeon: Shellia Cleverly, DO;  Location: [facility] ENDOSCOPY;  Service: Gastroenterology;  Laterality: N/A;   HEMOSTASIS CLIP PLACEMENT  09/11/2022   Procedure: HEMOSTASIS CLIP PLACEMENT;  Surgeon: Imogene Burn, MD;  Location: [facility] ENDOSCOPY;  Service: Gastroenterology;;   HEMOSTASIS CLIP PLACEMENT  09/17/2022   Procedure: HEMOSTASIS CLIP PLACEMENT;  Surgeon: Benancio Deeds, MD;  Location: [facility] ENDOSCOPY;  Service: Gastroenterology;;   HEMOSTASIS CLIP PLACEMENT  12/17/2022   Procedure: HEMOSTASIS CLIP PLACEMENT;  Surgeon: Napoleon Form, MD;  Location: [facility] ENDOSCOPY;  Service: Gastroenterology;;   HOT HEMOSTASIS N/A 09/11/2022   Procedure: HOT HEMOSTASIS (ARGON PLASMA  COAGULATION/BICAP);  Surgeon: Imogene Burn, MD;  Location: [name] ENDOSCOPY;  Service: Gastroenterology;  Laterality: N/A;   HOT HEMOSTASIS N/A 09/17/2022   Procedure: HOT HEMOSTASIS (ARGON PLASMA COAGULATION/BICAP);  Surgeon: Benancio Deeds, MD;  Location: [facility] ENDOSCOPY;  Service: Gastroenterology;  Laterality: N/A;   INCISION AND DRAINAGE PERIRECTAL ABSCESS N/A 06/15/2022   Procedure: IRRIGATION AND DEBRIDEMENT PERIRECTAL ABSCESS;  Surgeon: Franky Macho, MD;  Location: [facility] ORS;  Service: General;  Laterality: N/A;   IR EXCHANGE BILIARY DRAIN  03/16/2023   KNEE SURGERY Left 1998   arthroscopy   POLYPECTOMY  09/11/2022   Procedure: POLYPECTOMY;  Surgeon: Imogene Burn, MD;  Location: [facility] ENDOSCOPY;  Service: Gastroenterology;;   SHOULDER SURGERY Left 1994   rotator cuff   Family History  Family History  Problem Relation Age of Onset   Kidney disease Mother    Alcoholism Father    Alcoholism Brother    Colon cancer Neg Hx    Stomach cancer Neg Hx    Esophageal cancer Neg Hx    Social History  reports that he quit smoking about 44 years ago. His smoking use included cigarettes. He has never used smokeless tobacco. He reports that he does not drink alcohol and does not use drugs. Allergies No Known Allergies Home medications Prior to Admission medications   Medication Sig Start Date End Date Taking? Authorizing Provider  acetaminophen (TYLENOL) 325 MG tablet Take 2 tablets (650 mg total) by mouth every 6 (six) hours as needed for mild pain (or Fever >/= 101). 11/24/22  Yes Pokhrel, Laxman, MD  atorvastatin (LIPITOR) 40 MG tablet Take 40 mg by mouth at bedtime. 10/30/14  Yes [provider]  calcitRIOL (ROCALTROL) 0.25 MCG capsule Take 3 capsules (0.75 mcg total) by mouth every Monday, Wednesday, and Friday with hemodialysis. Patient taking differently: Take 0.25 mcg by mouth daily. 11/25/22  Yes Pokhrel, Laxman, MD  midodrine (PROAMATINE) 10 MG tablet Take 2 tablets (20 mg total) by  mouth every 8 (eight) hours. 01/02/23  Yes Narda Bonds, MD  pantoprazole (PROTONIX) 40 MG tablet Take 1 tablet (40 mg total) by mouth 2 (two) times daily before a meal. 11/24/22  Yes Pokhrel, Laxman, MD  pregabalin (LYRICA) 75 MG capsule Take 1 capsule (75 mg total) by mouth at bedtime. 11/24/22 11/24/23 Yes Pokhrel, Rebekah [place], MD  amiodarone (PACERONE) 200 MG tablet Take 1 tablet (200 mg total) by mouth 2 (two) times daily for 4 days, THEN 1 tablet (200 mg total) daily. Patient not taking: Reported on 03/29/2023 01/02/23 02/05/23  Narda Bonds, MD  b complex-vitamin c-folic acid (NEPHRO-VITE) 0.8 MG TABS tablet Take 1 tablet by mouth at bedtime. Patient not taking: Reported on 03/29/2023    [provider]  cinacalcet (SENSIPAR) 30 MG tablet Take 30 mg by mouth daily. Patient not taking: Reported on 03/29/2023 08/25/22   [provider]  Olopatadine HCl (PATADAY) 0.2 % SOLN Place 1 drop into both eyes daily. Patient not taking: Reported on 03/29/2023    [provider]  sevelamer carbonate (RENVELA) 800 MG tablet Take 800 mg by mouth 2 (two) times daily. Patient not taking: Reported on 03/29/2023    [provider]  traZODone (DESYREL) 50 MG tablet  Take 1 tablet (50 mg total) by mouth at bedtime as needed for sleep. Patient not taking: Reported on 03/29/2023 11/24/22   Joycelyn Das, MD     Vitals:   03/30/23 1645 03/30/23 1700 03/30/23 1715 03/30/23 1730  BP: 103/64 94/66 99/65  (!) 84/50  Pulse: 81 74 82 82  Resp: (!) 1 18 (!) 9 (!) 0  Temp:      TempSrc:      SpO2: 97% 94% 97% 94%  Weight:      Height:       Exam Gen alert, no distress, lethargic  But awakens easily No rash, cyanosis or gangrene Sclera anicteric, throat clear  No jvd or bruits Chest clear bilat to bases, no rales/ wheezing RRR no RG Abd soft ntnd no mass or ascites +bs GU nl male MS no joint effusions or deformity Ext no LE or UE edema, no wounds or ulcers Neuro is alert, Ox 3 ,  nf    RIJ TDC intact    Home meds include - lipitor, rocaltrol, midodrine 20 tid, protonix, pregabalin 75 hs, amiodarone, sensipar 30 every day, renvela 800 bid ac, trazodone, prns     [facility] 3h  450/ 1.5  77.5kg  2/2.5 bath   R TDC   Heparin none - last OP HD 9/13, post wt 80kg - lowest SBP's for last 2 wks have been in the 40s- 80s - coming off 3-4kg over dry wt the last 2 wks - mircera 75 q 2 wks, last 9/09, due 9/23  CXR - L retrocardiac infiltrate, no edema CT chest - sig diffuse ground glass changes, and some L base infiltration   Assessment/ Plan: Shock - acute on chronic. Suspected septic shock, requiring pressor support in ICU. Fluid collection by CT in pelvic area. Getting IV abx, cx's pending. Per CCM.  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 Missed HD Monday due to feeling sick. Labs and vol are okay, is on pressor support now. Will plan HD tomorrow.  Volume - not grossly overloaded on exam. Up by wts.  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 Next esa due on 9/23. Follow.  MBD ckd - CCa and phos are in range. Getting binder and sensipar for now.  DM2 - per CCM      Rob Arlean Hopping  MD [facility] 03/30/2023, 5:43 PM  Recent Labs  Lab 03/29/23 0847 03/29/23 2120 03/30/23 0251  HGB 9.8*  --  9.1*  ALBUMIN 2.5*  --   --   CALCIUM 8.6* 9.2 8.8*  PHOS  --   --  6.5*  CREATININE 9.26* 9.38* 9.65*  K 3.1* 3.6 3.5   Inpatient medications:  amiodarone  200 mg Oral Daily   atorvastatin  40 mg Oral QHS   Chlorhexidine Gluconate Cloth  6 each Topical Daily   cinacalcet  30 mg Oral Daily   feeding supplement  237 mL Oral BID BM   [START ON 03/31/2023] heparin  5,000 Units Subcutaneous Q8H   insulin aspart  0-6 Units Subcutaneous Q4H   midodrine  20 mg Oral Q8H   pantoprazole  40 mg Oral BID AC   pregabalin  75 mg Oral QHS   sevelamer carbonate  800 mg Oral BID    sodium chloride     norepinephrine (LEVOPHED) Adult infusion 5 mcg/min (03/30/23 1704)   piperacillin-tazobactam (ZOSYN)  IV  Stopped (03/30/23 1544)   acetaminophen, docusate sodium, mouth rinse, polyethylene glycol, traZODone

## 2023-03-30 NOTE — TOC CM/SW Note (Signed)
Transition of Care [facility]) - Inpatient Brief Assessment   Patient Details  Name: Justin [name] MRN: [id number] Date of Birth: 16-May-1946  Transition of Care [facility]) CM/SW Contact:    Tom-Johnson, Hershal Coria, RN Phone Number: 03/30/2023, 2:32 PM   Clinical Narrative:  Patient presented to the ED with Hypotension from Dialysis. Patient did not complete Dialysis. On a MWF outpatient Dialysis schedule. Uses [facility] transportation to and from Dialysis. Patient states his BP runs soft and on scheduled Midodrine at home.  Workup showed possible Lt Ischial Abscess.  Underwent Aspiration of Lt Ischial Abscess by Vascular and IR today 03/30/23.  On IV abx.   From home with Niece, [name]. Has 3 biological children and 5 step children. [name] assists patient with needs at home. Has a cane, walker and w/c at home.  PCP is [name], DO and uses [facility] on [address]   No TOC needs or recommendations noted at this time.  Patient not Medically ready for discharge.  CM will continue to follow as patient progresses with care towards discharge.          Transition of Care Asessment: Insurance and Status: Insurance coverage has been reviewed Patient has primary care physician: Yes Home environment has been reviewed: Yes Prior level of function:: Modified Independent Prior/Current Home Services: No current home services Social Determinants of Health Reivew: SDOH reviewed no interventions necessary Readmission risk has been reviewed: Yes Transition of care needs: transition of care needs identified, TOC will continue to follow

## 2023-03-31 DIAGNOSIS — R579 Shock, unspecified: Secondary | ICD-10-CM | POA: Diagnosis not present

## 2023-03-31 LAB — GLUCOSE, CAPILLARY
Glucose-Capillary: 102 mg/dL — ABNORMAL HIGH (ref 70–99)
Glucose-Capillary: 119 mg/dL — ABNORMAL HIGH (ref 70–99)

## 2023-03-31 MED ORDER — PREGABALIN 25 MG PO CAPS
75.0000 mg | ORAL_CAPSULE | Freq: Two times a day (BID) | ORAL | Status: DC
  Administered 2023-03-31 – 2023-04-08 (×17): 75 mg via ORAL
  Filled 2023-03-31 (×11): qty 1
  Filled 2023-03-31 (×2): qty 3
  Filled 2023-03-31 (×4): qty 1

## 2023-03-31 MED ORDER — ANTICOAGULANT SODIUM CITRATE 4% (200MG/5ML) IV SOLN: 5.0000 mL | Status: DC | PRN

## 2023-03-31 MED ORDER — PENTAFLUOROPROP-TETRAFLUOROETH EX AERO: 1.0000 | INHALATION_SPRAY | CUTANEOUS | Status: DC | PRN

## 2023-03-31 MED ORDER — NEPRO/CARBSTEADY PO LIQD: 237.0000 mL | ORAL | Status: DC | PRN

## 2023-03-31 MED ORDER — HEPARIN SODIUM (PORCINE) 1000 UNIT/ML DIALYSIS
1000.0000 [IU] | INTRAMUSCULAR | Status: DC | PRN
  Administered 2023-03-31: 3800 [IU]
  Administered 2023-04-02 – 2023-04-07 (×2): 1000 [IU]
  Filled 2023-03-31 (×6): qty 1

## 2023-03-31 MED ORDER — LIDOCAINE HCL (PF) 1 % IJ SOLN: 5.0000 mL | INTRAMUSCULAR | Status: DC | PRN

## 2023-03-31 MED ORDER — MEDIHONEY WOUND/BURN DRESSING EX PSTE
1.0000 | PASTE | Freq: Every day | CUTANEOUS | Status: DC
  Administered 2023-03-31 – 2023-04-06 (×5): 1 via TOPICAL
  Filled 2023-03-31 (×2): qty 44

## 2023-03-31 MED ORDER — ALTEPLASE 2 MG IJ SOLR: 2.0000 mg | Freq: Once | INTRAMUSCULAR | Status: DC | PRN

## 2023-03-31 MED ORDER — LIDOCAINE-PRILOCAINE 2.5-2.5 % EX CREA: 1.0000 | TOPICAL_CREAM | CUTANEOUS | Status: DC | PRN

## 2023-03-31 NOTE — Progress Notes (Signed)
Pt receives out-pt HD at [facility] on MWF. Will assist as needed.   Olivia Canter Renal Navigator [phone]

## 2023-03-31 NOTE — Progress Notes (Signed)
[facility] Progress Note  Subjective: seen in ICU, no /co's today  Vitals:   03/31/23 1000 03/31/23 1015 03/31/23 1030 03/31/23 1045  BP: 93/72 (!) 100/51 (!) 101/41 95/82  Pulse: (!) 113 (!) 113 (!) 114 (!) 123  Resp: 17 17 18  (!) 22  Temp:      TempSrc:      SpO2: 95% 90% 97% 95%  Weight:      Height:        Exam: Gen alert, no distress, on RA No jvd or bruits Chest clear bilat to bases RRR no RG Abd soft ntnd no mass or ascites +bs Ext no LE edema Neuro is alert, Ox 3 , nf    RIJ TDC intact     Home meds include - lipitor, rocaltrol, midodrine 20 tid, protonix, pregabalin 75 hs, amiodarone, sensipar 30 every day, renvela 800 bid ac, trazodone, prns        [facility] HD: [facility] 3h  450/ 1.5  77.5kg  2/2.5 bath   R TDC   Heparin none - last OP HD 9/13, post wt 80kg - lowest SBP's for last 2 wks have been in the 40s- 80s - coming off 3-4kg over dry wt the last 2 wks - mircera 75 q 2 wks, last 9/09, due 9/23   CXR - L retrocardiac infiltrate, no edema CT chest - sig diffuse ground glass changes, some L base infiltration     Assessment/ Plan: Shock - acute on chronic. Suspected septic shock, requiring pressor support in ICU. Fluid collection by CT in pelvic area. Getting IV abx, cx's pending. Per CCM.  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 Missed HD Monday due to feeling sick. HD today.  Volume - not grossly overloaded on exam. Up by wts. UF as tol w/ HD today Chronic hypotension - on midodrine 20 tid at home.  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 Next esa due on 9/23. Follow.  MBD ckd - CCa and phos are in range. Continuing binder and sensipar.  DM2 - per CCM        Rob Kvon Mcilhenny MD  [facility] 03/31/2023, 11:00 AM  Recent Labs  Lab 03/29/23 0847 03/29/23 2120 03/30/23 0251 03/31/23 0350  HGB 9.8*  --  9.1* 9.9*  ALBUMIN 2.5*  --   --   --   CALCIUM 8.6*   < > 8.8* 8.5*  PHOS  --   --  6.5*  --   CREATININE 9.26*   < > 9.65* 11.01*  K 3.1*   < > 3.5 3.8   < > = values  in this interval not displayed.   No results for input(s): "IRON", "TIBC", "FERRITIN" in the last 168 hours. Inpatient medications:  amiodarone  200 mg Oral Daily   atorvastatin  40 mg Oral QHS   Chlorhexidine Gluconate Cloth  6 each Topical Daily   cinacalcet  30 mg Oral Daily   feeding supplement  237 mL Oral BID BM   heparin  5,000 Units Subcutaneous Q8H   leptospermum manuka honey  1 Application Topical Daily   midodrine  20 mg Oral Q8H   pantoprazole  40 mg Oral BID AC   pregabalin  75 mg Oral QHS   sevelamer carbonate  800 mg Oral BID    sodium chloride     anticoagulant sodium citrate     norepinephrine (LEVOPHED) Adult infusion 3 mcg/min (03/31/23 0700)   piperacillin-tazobactam (ZOSYN)  IV Stopped (03/31/23 3664)   acetaminophen, alteplase,  anticoagulant sodium citrate, docusate sodium, feeding supplement (NEPRO CARB STEADY), heparin, lidocaine (PF), lidocaine-prilocaine, mouth rinse, pentafluoroprop-tetrafluoroeth, polyethylene glycol, traZODone

## 2023-03-31 NOTE — Consult Note (Addendum)
WOC Nurse Consult Note: Reason for Consult: Consult requested for bilat heels.  Performed remotely after review of progress notes and photos in the EMR. Pt is critically ill with multiple systemic factors which can impair healing.  Left heel Unstageable pressure injury, 6X6cm,  Right heel Unstageable pressure injury; 4X5cm;  both according to bedside nurse's wound flow sheet. 100% eschar loose and lifting at wound edges, mod amt tan drainage. White macerated skin surrounding wounds related to moisture.  Pressure Injury POA: Yes Dressing procedure/placement/frequency: Float heels in Prevalon boots to reduce pressure. Topical treatment orders provided for bedside nurses to perform as follows to assist with removal of nonviable tissue: Apply Medihoney to bilat heels Q day, then cover with foam dressings.  Change foam dressings Q 3 days or PRN soiling. Please re-consult if further assistance is needed.  Thank-you,  Cammie Mcgee MSN, RN, CWOCN, [place], CNS [phone]

## 2023-03-31 NOTE — Progress Notes (Signed)
   Informed consent signed and in chart.    TX duration: 3.5 hrs     Hand-off given to patient's nurse.    Access used: R CVC Access issues: None   Total UF removed: 2000 mL Medication(s) given: None Post HD VS: WDL Post HD weight: UTA     [facility] D[facility]

## 2023-03-31 NOTE — Progress Notes (Signed)
NAME:  Justin [name], MRN:  [id number], DOB:  Feb 11, 1946, LOS: 2 ADMISSION DATE:  03/29/2023, CONSULTATION DATE:  03/31/23 REFERRING MD:  Karene Fry, CHIEF COMPLAINT:  hypotension   History of Present Illness:  Justin [name] is a 77 y.o. 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  He did denies any recent fever, chills, chest or abdominal pain.  He had a similar episode one week ago and was sent in from dialysis, his BP improved in the ED and he was discharged.  Pt states that his BP runs low chronically and he takes Midodrine 20mg  prior to dialysis.  He reports chronic diarrhea that is unchanged from baseline, but does feel like he hasn't been drinking as much fluid as he should.  He feels fatigued when his BP drops, but not dizzy or lightheaded   In the ED, he was given 1L IVF and started on levophed.  Work-up revealed a possible abscess in the L ischial area, 3.5cm fluid collection within the soft tissue.  Pt has not noted any significant pain or redness in the area.   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   Significant Hospital Events: Including procedures, antibiotic start and stop dates in addition to other pertinent events   9/16  presented with hypotension from dialysis  9/17 left ischial abscess drained  Interim History / Subjective:  On lower dose norepinephrine today, no acute overnight issues. About to get dialysis  Objective   Blood pressure 95/82, pulse (!) 123, temperature 97.8 F (36.6 C), temperature source Axillary, resp. rate (!) 22, height 5\' 10"  (1.778 m), weight 84.2 kg, SpO2 95%.        Intake/Output Summary (Last 24 hours) at 03/31/2023 1054 Last data filed at 03/31/2023 0700 Gross per 24 hour  Intake 506.16 ml  Output 275 ml  Net 231.16 ml   Filed Weights   03/29/23 2030 03/30/23 0500 03/31/23 0500  Weight: 83 kg 85.7 kg 84.2 kg   Elderly, laying flat comfortably Non labored breathing Tachycardic, irregular, systolic murmur present Right chest wall tunneled dialysis line Cholecystostomy drain present with sanguinous drainage   Labs reviewed 3ml serosanguinous fluid drained form left ischial abscess, multiple PMNs but culture negaive  Resolved Hospital Problem list     Assessment & Plan:    Acute on Chronic Hypotension Shock: likely septic with abscess vs. Hypovolemic given chronic diarrhea and decreased po intake P: -cont levo for map goal >60 -given no positive culture data will plan for empiric 7 day course of zosyn -follow cultures -trend wbc/fever curve -cont midodrine  ESRD  Hypokalemia Was not dialyzed 9/16 P: -HD per nephro -Trend BMP / urinary output -Replace electrolytes as indicated -Avoid nephrotoxic agents, ensure adequate renal perfusion - hopefully he tolerates HD today  Atrial Fibrillation: not on AC given recent GI bleed HLD P: -cont amio and statin  Type 2 DM No home  medications  P: -SSI and cbg monitoring -a1c  Chronic anemia P: -hgb stable -trend cbc  Best Practice (right click and "Reselect all SmartList Selections" daily)   Diet/type: Regular consistency (see orders) DVT prophylaxis: prophylactic heparin  GI prophylaxis:  PPI Lines: Dialysis Catheter tunneled Foley:  N/A Code Status:  DNR Last date of multidisciplinary goals of care discussion [facility] confirmed 9/16]  Labs   CBC: Recent Labs  Lab 03/29/23 0847 03/30/23 0251 03/31/23 0350  WBC 8.3 8.5 9.9  NEUTROABS 6.2  --   --   HGB 9.8* 9.1* 9.9*  HCT 32.5* 29.7* 31.5*  MCV 100.9* 98.3 99.7  PLT 166 186 187    Basic Metabolic Panel: Recent Labs  Lab 03/29/23 0847 03/29/23 2120 03/30/23 0251 03/31/23 0350  NA 140 138 135 137  K 3.1* 3.6 3.5 3.8  CL 101 98 97* 98  CO2 27 24 26 23   GLUCOSE 129* 194* 216* 108*  BUN 36* 39* 40* 44*  CREATININE 9.26* 9.38* 9.65* 11.01*  CALCIUM 8.6* 9.2 8.8* 8.5*  MG  --  2.2 2.1 2.2  PHOS  --   --  6.5*  --    GFR: Estimated Creatinine Clearance: 5.7 mL/min (A) (by C-G formula based on SCr of 11.01 mg/dL (H)). Recent Labs  Lab 03/29/23 0847 03/29/23 0858 03/30/23 0251 03/31/23 0350  PROCALCITON  --   --  0.24  --   WBC 8.3  --  8.5 9.9  LATICACIDVEN  --  1.6  --   --     Liver Function Tests: Recent Labs  Lab 03/29/23 0847  AST 17  ALT 15  ALKPHOS 87  BILITOT 0.6  PROT 6.9  ALBUMIN 2.5*   No results for input(s): "LIPASE", "AMYLASE" in the last 168 hours. No results for input(s): "AMMONIA" in the last 168 hours.  ABG    Component Value Date/Time   PHART 7.301 (L) 11/09/2022 0259   PCO2ART 57.6 (H) 11/09/2022 0259   PO2ART 109 (H) 11/09/2022 0259   HCO3 28.4 (H) 11/09/2022 0259   TCO2 35 (H) 12/13/2022 2353   O2SAT 98 11/09/2022 0259     Coagulation Profile: No results for input(s): "INR", "PROTIME" in the last 168 hours.  Cardiac Enzymes: No results for input(s): "CKTOTAL", "CKMB", "CKMBINDEX", "TROPONINI" in the last 168 hours.  HbA1C: Hgb A1c MFr Bld  Date/Time Value Ref Range Status  12/14/2022 10:37 AM 5.1 4.8 - 5.6 % Final    Comment:    (NOTE)         Prediabetes: 5.7 - 6.4         Diabetes: >6.4         Glycemic control for adults with diabetes: <7.0    06/16/2022 03:55 AM 6.5 (H) 4.8 - 5.6 % Final    Comment:    (NOTE)         Prediabetes: 5.7 - 6.4         Diabetes: >6.4         Glycemic control for adults with diabetes: <7.0     CBG: Recent Labs  Lab 03/30/23 1230 03/30/23 1528 03/30/23 1914 03/31/23 0029 03/31/23 0401  GLUCAP 130* 127* 157* 119* 102*   Critical care time:       The patient is critically ill due to shock.  Critical care was necessary to treat or prevent imminent or life-threatening deterioration.  Critical care was time spent personally by me on the following activities: development of treatment plan with patient  and/or surrogate as well as nursing, discussions with consultants, evaluation of patient's response to treatment, examination of patient, obtaining history from patient or surrogate, ordering and performing treatments and interventions, ordering and review of laboratory studies, ordering and review of radiographic studies, pulse oximetry, re-evaluation of patient's condition and participation in multidisciplinary rounds.   Critical Care Time devoted to patient care services described in this note is 35 minutes. This time reflects time of care of this signee Charlott Holler . This critical care time does not reflect separately billable procedures or procedure time, teaching time or supervisory time of PA/NP/Med student/Med Resident etc but could involve care discussion time.       Charlott Holler [facility] 03/31/2023 10:54 AM  Pager: see AMION  If no response to pager , please call critical care on call (see AMION) until 7pm After 7:00 pm call Elink

## 2023-04-01 DIAGNOSIS — R579 Shock, unspecified: Secondary | ICD-10-CM | POA: Diagnosis not present

## 2023-04-01 LAB — CBC
HCT: 31.9 % — ABNORMAL LOW (ref 39.0–52.0)
Hemoglobin: 10 g/dL — ABNORMAL LOW (ref 13.0–17.0)
MCH: 30.6 pg (ref 26.0–34.0)
MCHC: 31.3 g/dL (ref 30.0–36.0)
MCV: 97.6 fL (ref 80.0–100.0)
Platelets: 192 10*3/uL (ref 150–400)
RBC: 3.27 MIL/uL — ABNORMAL LOW (ref 4.22–5.81)
RDW: 16.7 % — ABNORMAL HIGH (ref 11.5–15.5)
WBC: 7.9 10*3/uL (ref 4.0–10.5)
nRBC: 0 % (ref 0.0–0.2)

## 2023-04-01 MED ORDER — CHLORHEXIDINE GLUCONATE CLOTH 2 % EX PADS: 6.0000 | MEDICATED_PAD | Freq: Every day | CUTANEOUS | Status: DC

## 2023-04-01 MED ORDER — DROXIDOPA 100 MG PO CAPS
100.0000 mg | ORAL_CAPSULE | Freq: Three times a day (TID) | ORAL | Status: DC
  Administered 2023-04-01 – 2023-04-02 (×3): 100 mg via ORAL
  Filled 2023-04-01 (×5): qty 1

## 2023-04-01 NOTE — Progress Notes (Addendum)
Rerounded with nursing at the bedside several times over the course of the afternoon. He is mentating well. Has been on norepinephrine all day. Says he only really feels bad when his systolic drops below 60. Has chronic hypotension at home. We are not going to resume vasopressors. He is stable for transfer to progressive with goal MAP>55. Droxidopa added for BP support. Need to make sure he can tolerate dialysis tomorrow before discharge. Will place transfer orders and send out to [facility] to assume care 9/20.   Additional cc time 30 minutes  Durel Salts, MD Pulmonary and Critical Care Medicine [facility] 04/01/2023 4:48 PM Pager: see AMION  If no response to pager, please call critical care on call (see AMION) until 7pm After 7:00 pm call Elink

## 2023-04-01 NOTE — Progress Notes (Signed)
NAME:  Justin BARSKI, MRN:  [id number], DOB:  1945-08-31, LOS: 3 ADMISSION DATE:  03/29/2023, CONSULTATION DATE:  04/01/23 REFERRING MD:  Karene Fry, CHIEF COMPLAINT:  hypotension   History of Present Illness:  Justin [name] is a 77 y.o. 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  He did denies any recent fever, chills, chest or abdominal pain.  He had a similar episode one week ago and was sent in from dialysis, his BP improved in the ED and he was discharged.  Pt states that his BP runs low chronically and he takes Midodrine 20mg  prior to dialysis.  He reports chronic diarrhea that is unchanged from baseline, but does feel like he hasn't been drinking as much fluid as he should.  He feels fatigued when his BP drops, but not dizzy or lightheaded   In the ED, he was given 1L IVF and started on levophed.  Work-up revealed a possible abscess in the L ischial area, 3.5cm fluid collection within the soft tissue.  Pt has not noted any significant pain or redness in the area.   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   Significant Hospital Events: Including procedures, antibiotic start and stop dates in addition to other pertinent events   9/16  presented with hypotension from dialysis  9/17 left ischial abscess drained  Interim History / Subjective:  No overnight issues. Tolerated HD  yesterday with low dose vasopressor support.  On low dose norepi this morning.   Objective   Blood pressure (!) 123/42, pulse 73, temperature 98.4 F (36.9 C), temperature source Oral, resp. rate 16, height 5\' 10"  (1.778 m), weight 86.4 kg, SpO2 98%.        Intake/Output Summary (Last 24 hours) at 04/01/2023 1029 Last data filed at 04/01/2023 0700 Gross per 24 hour  Intake 588.47 ml  Output 2200 ml  Net -1611.53 ml   Filed Weights   03/30/23 0500 03/31/23 0500 04/01/23 0417  Weight: 85.7 kg 84.2 kg 86.4 kg   Elderly, laying flat no distress On room air breathing non labored, no wheeze RRR Systolic murmur No peripheral edema Tunneled right chest HD line   Labs reviewed Na 137 K 3.8  WBC 7.9 Hgb 10.0  Resolved Hospital Problem list     Assessment & Plan:    Acute on Chronic Hypotension Shock: likely septic with abscess vs. Hypovolemic given chronic diarrhea and decreased po intake P: -cont levo for SBP>90. Hoping to get norepinephrine off today -given no positive culture data will plan for empiric 7 day course of zosyn -follow cultures -trend wbc/fever curve -cont midodrine   ESRD  Hypokalemia Was not dialyzed 9/16 P: -HD per nephro -Trend BMP / urinary output -Replace electrolytes as indicated -Avoid nephrotoxic agents, ensure adequate renal perfusion - tolerated HD 9/18.   Atrial Fibrillation: not on AC given recent GI bleed HLD P: -cont amio and statin  Type 2 DM No home medications  P: -SSI and cbg monitoring -a1c  Chronic anemia P: -hgb stable -trend cbc  Best Practice (right click and "Reselect all SmartList Selections" daily)   Diet/type: Regular consistency (see orders) DVT prophylaxis: prophylactic heparin  GI prophylaxis: PPI Lines: Dialysis Catheter tunneled Foley:  N/A Code Status:   DNR Last date of multidisciplinary goals of care discussion [DNR confirmed 9/16]  If he tolerates off vasopressors for goal SBP>90, will send out of ICU.  Needs to tolerate HD 9/20. Will signout to [facility].   Durel Salts, MD Pulmonary and Critical Care Medicine [facility] 04/01/2023 10:31 AM Pager: see AMION  If no response to pager, please call critical care on call (see AMION) until 7pm After 7:00 pm call Elink     Labs   CBC: Recent Labs  Lab 03/29/23 0847 03/30/23 0251 03/31/23 0350 04/01/23 0223  WBC 8.3 8.5 9.9 7.9  NEUTROABS 6.2  --   --   --   HGB 9.8* 9.1* 9.9* 10.0*  HCT 32.5* 29.7* 31.5* 31.9*  MCV 100.9* 98.3 99.7 97.6  PLT 166 186 187 192    Basic Metabolic Panel: Recent Labs  Lab 03/29/23 0847 03/29/23 2120 03/30/23 0251 03/31/23 0350  NA 140 138 135 137  K 3.1* 3.6 3.5 3.8  CL 101 98 97* 98  CO2 27 24 26 23   GLUCOSE 129* 194* 216* 108*  BUN 36* 39* 40* 44*  CREATININE 9.26* 9.38* 9.65* 11.01*  CALCIUM 8.6* 9.2 8.8* 8.5*  MG  --  2.2 2.1 2.2  PHOS  --   --  6.5*  --    GFR: Estimated Creatinine Clearance: 5.7 mL/min (A) (by C-G formula based on SCr of 11.01 mg/dL (H)). Recent Labs  Lab 03/29/23 0847 03/29/23 0858 03/30/23 0251 03/31/23 0350 04/01/23 0223  PROCALCITON  --   --  0.24  --   --   WBC 8.3  --  8.5 9.9 7.9  LATICACIDVEN  --  1.6  --   --   --     Liver Function Tests: Recent Labs  Lab 03/29/23 0847  AST 17  ALT 15  ALKPHOS 87  BILITOT 0.6  PROT 6.9  ALBUMIN 2.5*   No results for input(s): "LIPASE", "AMYLASE" in the last 168 hours. No results for input(s): "AMMONIA" in the last 168 hours.  ABG    Component Value Date/Time   PHART 7.301 (L) 11/09/2022 0259   PCO2ART 57.6 (H) 11/09/2022 0259   PO2ART 109 (H) 11/09/2022 0259   HCO3 28.4 (H) 11/09/2022 0259   TCO2 35 (H) 12/13/2022 2353   O2SAT 98 11/09/2022 0259     Coagulation Profile: No results for input(s): "INR", "PROTIME" in the last 168  hours.  Cardiac Enzymes: No results for input(s): "CKTOTAL", "CKMB", "CKMBINDEX", "TROPONINI" in the last 168 hours.  HbA1C: Hgb A1c MFr Bld  Date/Time Value Ref Range Status  12/14/2022 10:37 AM 5.1 4.8 - 5.6 % Final    Comment:    (NOTE)         Prediabetes: 5.7 - 6.4         Diabetes: >6.4         Glycemic control for adults with diabetes: <7.0   06/16/2022 03:55 AM 6.5 (H) 4.8 - 5.6 % Final    Comment:    (NOTE)         Prediabetes: 5.7 - 6.4         Diabetes: >6.4  Glycemic control for adults with diabetes: <7.0     CBG: Recent Labs  Lab 03/30/23 1230 03/30/23 1528 03/30/23 1914 03/31/23 0029 03/31/23 0401  GLUCAP 130* 127* 157* 119* 102*   Critical care time:      The patient is critically ill due to shock.  Critical care was necessary to treat or prevent imminent or life-threatening deterioration.  Critical care was time spent personally by me on the following activities: development of treatment plan with patient and/or surrogate as well as nursing, discussions with consultants, evaluation of patient's response to treatment, examination of patient, obtaining history from patient or surrogate, ordering and performing treatments and interventions, ordering and review of laboratory studies, ordering and review of radiographic studies, pulse oximetry, re-evaluation of patient's condition and participation in multidisciplinary rounds.   Critical Care Time devoted to patient care services described in this note is 38 minutes. This time reflects time of care of this signee Charlott Holler . This critical care time does not reflect separately billable procedures or procedure time, teaching time or supervisory time of PA/NP/Med student/Med Resident etc but could involve care discussion time.       Charlott Holler [place] Pulmonary and Critical Care Medicine 04/01/2023 10:29 AM  Pager: see AMION  If no response to pager , please call critical care on call (see AMION)  until 7pm After 7:00 pm call Elink

## 2023-04-01 NOTE — Progress Notes (Signed)
[facility] Progress Note  Subjective: seen in ICU. L ischial abscess was drained yesterday by IR.   Vitals:   04/01/23 1130 04/01/23 1156 04/01/23 1200 04/01/23 1230  BP: (!) 87/48  (!) 86/36 (!) 97/30  Pulse: (!) 59 60 68 61  Resp: 13 13 13 14   Temp:  (!) 97.4 F (36.3 C)    TempSrc:  Oral    SpO2: 96% 97% 94% 94%  Weight:      Height:        Exam: Gen alert, no distress, on RA No jvd or bruits Chest clear bilat to bases RRR no RG Abd soft ntnd no mass or ascites +bs Ext no LE edema Neuro is alert, Ox 3 , nf    RIJ TDC intact     Home meds include - lipitor, rocaltrol, midodrine 20 tid, protonix, pregabalin 75 hs, amiodarone, sensipar 30 every day, renvela 800 bid ac, trazodone, prns        OP HD: [facility] M[facility] 3h  450/ 1.5  77.5kg  2/2.5 bath   R TDC   Heparin none - last OP HD 9/13, post wt 80kg - lowest SBP's for last 2 wks have been in the 40s- 80s - coming off 3-4kg over dry wt the last 2 wks - mircera 75 q 2 wks, last 9/09, due 9/23   CXR - L retrocardiac infiltrate, no edema CT chest - sig diffuse ground glass changes, some L base infiltration     Assessment/ Plan: Shock - acute on chronic. Suspected septic shock, still remains on levo gtt. Ischial abscess drained by IR yesterday. Getting IV abx, cx's pending.  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 Missed outpt HD 9/16. Had HD here yesterday 9/18. Next HD tomorrow.  Volume - not grossly overloaded on exam, admit CXR no edema. Up by wts. 2 L UF yest w/ HD.  Chronic hypotension - on midodrine 20 tid here as at home.  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 Next esa due on 9/23. Follow.  MBD ckd - CCa and phos are in range. Continuing binder and sensipar.  DM2 - per CCM     Rob Mireya Meditz MD  [facility] 04/01/2023, 1:49 PM  Recent Labs  Lab 03/29/23 0847 03/29/23 2120 03/30/23 0251 03/31/23 0350 04/01/23 0223  HGB 9.8*  --  9.1* 9.9* 10.0*  ALBUMIN 2.5*  --   --   --   --   CALCIUM 8.6*   < > 8.8* 8.5*  --   PHOS  --    --  6.5*  --   --   CREATININE 9.26*   < > 9.65* 11.01*  --   K 3.1*   < > 3.5 3.8  --    < > = values in this interval not displayed.   No results for input(s): "IRON", "TIBC", "FERRITIN" in the last 168 hours. Inpatient medications:  amiodarone  200 mg Oral Daily   atorvastatin  40 mg Oral QHS   Chlorhexidine Gluconate Cloth  6 each Topical Daily   cinacalcet  30 mg Oral Daily   droxidopa  100 mg Oral TID WC   feeding supplement  237 mL Oral BID BM   heparin  5,000 Units Subcutaneous Q8H   leptospermum manuka honey  1 Application Topical Daily   midodrine  20 mg Oral Q8H   pantoprazole  40 mg Oral BID AC   pregabalin  75 mg Oral BID   sevelamer carbonate  800 mg Oral  BID    sodium chloride     anticoagulant sodium citrate     norepinephrine (LEVOPHED) Adult infusion 6 mcg/min (04/01/23 0700)   piperacillin-tazobactam (ZOSYN)  IV Stopped (04/01/23 0533)   acetaminophen, alteplase, anticoagulant sodium citrate, docusate sodium, feeding supplement (NEPRO CARB STEADY), heparin, lidocaine (PF), lidocaine-prilocaine, mouth rinse, pentafluoroprop-tetrafluoroeth, polyethylene glycol, traZODone

## 2023-04-02 ENCOUNTER — Other Ambulatory Visit (HOSPITAL_COMMUNITY): Payer: Self-pay

## 2023-04-02 DIAGNOSIS — I959 Hypotension, unspecified: Secondary | ICD-10-CM

## 2023-04-02 LAB — BASIC METABOLIC PANEL
Anion gap: 14 (ref 5–15)
BUN: 32 mg/dL — ABNORMAL HIGH (ref 8–23)
CO2: 24 mmol/L (ref 22–32)
Calcium: 7.7 mg/dL — ABNORMAL LOW (ref 8.9–10.3)
Chloride: 96 mmol/L — ABNORMAL LOW (ref 98–111)
Creatinine, Ser: 7.77 mg/dL — ABNORMAL HIGH (ref 0.61–1.24)
GFR, Estimated: 7 mL/min — ABNORMAL LOW (ref >60)
Glucose, Bld: 74 mg/dL (ref 70–99)
Potassium: 3.5 mmol/L (ref 3.5–5.1)
Sodium: 134 mmol/L — ABNORMAL LOW (ref 135–145)

## 2023-04-02 LAB — CBC
HCT: 33.3 % — ABNORMAL LOW (ref 39.0–52.0)
Hemoglobin: 10.3 g/dL — ABNORMAL LOW (ref 13.0–17.0)
MCH: 30.1 pg (ref 26.0–34.0)
MCHC: 30.9 g/dL (ref 30.0–36.0)
MCV: 97.4 fL (ref 80.0–100.0)
Platelets: 180 10*3/uL (ref 150–400)
RBC: 3.42 MIL/uL — ABNORMAL LOW (ref 4.22–5.81)
RDW: 16.7 % — ABNORMAL HIGH (ref 11.5–15.5)
WBC: 6.7 10*3/uL (ref 4.0–10.5)
nRBC: 0 % (ref 0.0–0.2)

## 2023-04-02 LAB — GLUCOSE, CAPILLARY: Glucose-Capillary: 103 mg/dL — ABNORMAL HIGH (ref 70–99)

## 2023-04-02 NOTE — Progress Notes (Signed)
[facility] Progress Note  Subjective: seen in HD unit. No c/o's.   Vitals:   04/02/23 1100 04/02/23 1115 04/02/23 1118 04/02/23 1130  BP: (!) 84/70 (S) (!) 69/55 (S) (!) 76/57 97/66  Pulse: 92 (!) 120 (!) 122 (!) 112  Resp: 15 13 11 14   Temp:      TempSrc:      SpO2: 98% 96% 96% 94%  Weight:      Height:        Exam: Gen alert, no distress, on RA No jvd or bruits Chest clear bilat to bases RRR no RG Abd soft ntnd no mass or ascites +bs Ext no LE edema Neuro is alert, Ox 3 , nf    RIJ TDC intact     Home meds include - lipitor, rocaltrol, midodrine 20 tid, protonix, pregabalin 75 hs, amiodarone, sensipar 30 every day, renvela 800 bid ac, trazodone, prns        OP HD: [facility] M[facility] 3h  450/ 1.5  77.5kg  2/2.5 bath   R TDC   Heparin none - last OP HD 9/13, post wt 80kg - lowest SBP's for last 2 wks have been in the 40s- 80s - coming off 3-4kg over dry wt the last 2 wks - mircera 75 q 2 wks, last 9/09, due 9/23   CXR - L retrocardiac infiltrate, no edema CT chest - sig diffuse ground glass changes, some L base infiltration     Assessment/ Plan: Shock - acute on chronic hypotension. Resolving, off pressors and moved out of ICU. Ischial abscess drained by IR 9/18. Getting IV abx per pmd.  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 Missed outpt HD 9/16. HD here 9/18 and HD today this am.  Volume - some edema on initial exam. Admit CXR no edema. Up by wts. 3 L UF as tol w/ hd today.  Chronic hypotension - on midodrine 20 tid here as at home.  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 Next esa due on 9/23. Follow.  MBD ckd - CCa and phos are in range. Continuing binder and sensipar.  DM2 - per CCM     Rob Arlean Hopping MD  [facility] 04/02/2023, 11:47 AM  Recent Labs  Lab 03/29/23 0847 03/29/23 2120 03/30/23 0251 03/31/23 0350 04/01/23 0223 04/02/23 0304  HGB 9.8*  --  9.1* 9.9* 10.0* 10.3*  ALBUMIN 2.5*  --   --   --   --   --   CALCIUM 8.6*   < > 8.8* 8.5*  --  7.7*  PHOS  --   --  6.5*  --    --   --   CREATININE 9.26*   < > 9.65* 11.01*  --  7.77*  K 3.1*   < > 3.5 3.8  --  3.5   < > = values in this interval not displayed.   No results for input(s): "IRON", "TIBC", "FERRITIN" in the last 168 hours. Inpatient medications:  amiodarone  200 mg Oral Daily   atorvastatin  40 mg Oral QHS   Chlorhexidine Gluconate Cloth  6 each Topical Daily   cinacalcet  30 mg Oral Daily   droxidopa  100 mg Oral TID WC   feeding supplement  237 mL Oral BID BM   heparin  5,000 Units Subcutaneous Q8H   leptospermum manuka honey  1 Application Topical Daily   midodrine  20 mg Oral Q8H   pantoprazole  40 mg Oral BID AC   pregabalin  75 mg  Oral BID   sevelamer carbonate  800 mg Oral BID    sodium chloride     anticoagulant sodium citrate     norepinephrine (LEVOPHED) Adult infusion Stopped (04/01/23 1059)   piperacillin-tazobactam (ZOSYN)  IV 2.25 g (04/02/23 0519)   acetaminophen, alteplase, anticoagulant sodium citrate, docusate sodium, feeding supplement (NEPRO CARB STEADY), heparin, lidocaine (PF), lidocaine-prilocaine, mouth rinse, pentafluoroprop-tetrafluoroeth, polyethylene glycol, traZODone

## 2023-04-02 NOTE — Progress Notes (Signed)
Pharmacy Antibiotic Note  Justin [name] is a 77 y.o. male admitted on 03/29/2023 with hypotension and abscess.  Pharmacy has been consulted for Zosyn (piperacillin-tazobactam) dosing.  ESRD on HD MWF, last HD 9/20 AM  Plan: Continue Zosyn 2.25g Q8H Monitor daily CBC, temp, SCr, and for clinical signs of improvement  F/u cultures and de-escalate antibiotics as able   Height: 5\' 10"  (177.8 cm) Weight:  (BED UNABLE TO WEIGH) IBW/kg (Calculated) : 73  Temp (24hrs), Avg:97.9 F (36.6 C), Min:97.5 F (36.4 C), Max:98.4 F (36.9 C)  Recent Labs  Lab 03/29/23 0847 03/29/23 0858 03/29/23 2120 03/30/23 0251 03/31/23 0350 04/01/23 0223 04/02/23 0304  WBC 8.3  --   --  8.5 9.9 7.9 6.7  CREATININE 9.26*  --  9.38* 9.65* 11.01*  --  7.77*  LATICACIDVEN  --  1.6  --   --   --   --   --     Estimated Creatinine Clearance: 8.1 mL/min (A) (by C-G formula based on SCr of 7.77 mg/dL (H)).    No Known Allergies  Antimicrobials this admission: Cefepime x1 9/16 Zosyn 9/17 >>   Dose adjustments this admission: N/A  Microbiology results: 9/16 BCx: NGTD x4d 9/16 MRSA PCR: not detected 9/17 L ischial abscess cx: NGTD x3d   Wilburn Cornelia, PharmD, BCPS Clinical Pharmacist 04/02/2023 1:17 PM   Please refer to AMION for pharmacy phone number

## 2023-04-02 NOTE — Progress Notes (Signed)
PROGRESS NOTE  Justin [name]  WUJ:[id number] DOB: 01-07-1946 DOA: 03/29/2023 PCP: [name], DO   Brief Narrative: Patient is a 77 year old male with history of ESRD on dialysis, GI bleed, rectal abscess, cholecystitis with percutaneous drain, DM, A-fib on Eliquis who presented from hemodialysis unit for the evaluation of hypotension.  He required pressors in the emergency department.  Admitted under PCCM service.  His BP runs chronically low and administered midodrine.  Also has chronic diarrhea.  Started on IV fluid and Levophed in the emergency department.  Also found to have possible abscess in the left ischial area, about 3.5 cm.  Underwent CT guided aspiration of small left ischial abscess by IR on 9/17.  After hemodynamically stability, pressures was discontinued, patient transferred to our service on 9/20  Assessment & Plan:  Principal Problem:   Hypotension  Acute on chronic hypotension:  Patient has history of chronic hypotension.  Also suspected to have septic shock secondary to abscess.  Also has chronic diarrhea and was having lower oral intake.  Pressures and fluid started on ED.  Currently off pressors.  Continue midodrine.  Patient is afebrile, sepsis physiology has resolved.  Blood pressure stable this morning at dialysis suite  Left ischial abscess: About 3 cm.  Status post CT-guided drainage.  Wound culture did not show any organism.  Plan for continue with Zosyn for now and discharge on Augmentin when ready.  Blood cultures have been negative so far  History of cholecystectomy: Has right upper quadrant percutaneous cholecystostomy tube.  He gets regular exchange  of this tube at [facility].  He follows with [facility].  He has also been referred to general surgery for the consideration of cholecystectomy.  ESRD/hyperkalemia: Nephrology following for dialysis.  Paroxysmal A-fib: On amiodarone.  Not on anticoagulant due to history of recent GI bleed  Type 2 diabetes:  Currently on sliding scale.  Monitor blood sugar  Chronic anemia: Currently hemoglobin stable.  Most likely asscoaited with  history with ESRD.        Pressure Injury 12/14/22 Heel Left;Posterior Unstageable - Full thickness tissue loss in which the base of the injury is covered by slough (yellow, tan, gray, green or brown) and/or eschar (tan, brown or black) in the wound bed. (Active)  12/14/22 0700  Location: Heel  Location Orientation: Left;Posterior  Staging: Unstageable - Full thickness tissue loss in which the base of the injury is covered by slough (yellow, tan, gray, green or brown) and/or eschar (tan, brown or black) in the wound bed.  Wound Description (Comments):   Present on Admission: Yes  Dressing Type Foam - Lift dressing to assess site every shift 04/01/23 0728     Pressure Injury 12/14/22 Heel Right Unstageable - Full thickness tissue loss in which the base of the injury is covered by slough (yellow, tan, gray, green or brown) and/or eschar (tan, brown or black) in the wound bed. (Active)  12/14/22 0700  Location: Heel  Location Orientation: Right  Staging: Unstageable - Full thickness tissue loss in which the base of the injury is covered by slough (yellow, tan, gray, green or brown) and/or eschar (tan, brown or black) in the wound bed.  Wound Description (Comments):   Present on Admission: Yes  Dressing Type Foam - Lift dressing to assess site every shift 04/01/23 0728    DVT prophylaxis:heparin injection 5,000 Units Start: 03/31/23 0600 SCDs Start: 03/29/23 1754     Code Status: Do not attempt resuscitation (DNR) PRE-ARREST INTERVENTIONS DESIRED  Family  Communication: None at bedside  Patient status:Inpatient  Patient is from :Home  Anticipated discharge EX:[facility]  Estimated DC date:tomorrow   Consultants: PCCM, nephrology  Procedures: CT-guided abscess drainage  Antimicrobials:  Anti-infectives (From admission, onward)    Start     Dose/Rate  Route Frequency Ordered Stop   03/30/23 1400  piperacillin-tazobactam (ZOSYN) IVPB 2.25 g        2.25 g 100 mL/hr over 30 Minutes Intravenous Every 8 hours 03/30/23 0801     03/30/23 0900  vancomycin (VANCOREADY) IVPB 1750 mg/350 mL  Status:  Discontinued        1,750 mg 175 mL/hr over 120 Minutes Intravenous  Once 03/30/23 0807 03/30/23 0842   03/29/23 1600  ceFEPIme (MAXIPIME) 2 g in sodium chloride 0.9 % 100 mL IVPB        2 g 200 mL/hr over 30 Minutes Intravenous  Once 03/29/23 1554 03/29/23 1736   03/29/23 1600  vancomycin (VANCOREADY) IVPB 1750 mg/350 mL  Status:  Discontinued        1,750 mg 175 mL/hr over 120 Minutes Intravenous  Once 03/29/23 1554 03/30/23 1324       Subjective: Patient seen and examined the dialysis it.  He was comfortable, lying in bed.  Blood pressure stable.  He was very irritable and did not want to talk much.  Denies any chest pain, shortness of breath, abdominal pain.  He does not feel ready to go home today  Objective: Vitals:   04/02/23 0400 04/02/23 0500 04/02/23 0600 04/02/23 0728  BP: 105/60 (!) 91/49 104/64   Pulse: 73 76 77   Resp: 13 12 12    Temp:    97.9 F (36.6 C)  TempSrc:    Axillary  SpO2: 95% 93% 94%   Weight:  83.1 kg    Height:        Intake/Output Summary (Last 24 hours) at 04/02/2023 0806 Last data filed at 04/02/2023 0600 Gross per 24 hour  Intake 160.71 ml  Output 150 ml  Net 10.71 ml   Filed Weights   03/31/23 0500 04/01/23 0417 04/02/23 0500  Weight: 84.2 kg 86.4 kg 83.1 kg    Examination:  General exam: Overall comfortable, not in distress HEENT: PERRL Respiratory system:  no wheezes or crackles  Cardiovascular system: S1 & S2 heard, RRR.  Dialysis catheter on the right chest Gastrointestinal system: Abdomen is nondistended, soft and nontender.  Right upper quadrant drain Central nervous system: Alert and oriented Extremities: No edema, no clubbing ,no cyanosis Skin: No rashes, no ulcers,no icterus      Data Reviewed: I have personally reviewed following labs and imaging studies  CBC: Recent Labs  Lab 03/29/23 0847 03/30/23 0251 03/31/23 0350 04/01/23 0223 04/02/23 0304  WBC 8.3 8.5 9.9 7.9 6.7  NEUTROABS 6.2  --   --   --   --   HGB 9.8* 9.1* 9.9* 10.0* 10.3*  HCT 32.5* 29.7* 31.5* 31.9* 33.3*  MCV 100.9* 98.3 99.7 97.6 97.4  PLT 166 186 187 192 180   Basic Metabolic Panel: Recent Labs  Lab 03/29/23 0847 03/29/23 2120 03/30/23 0251 03/31/23 0350 04/02/23 0304  NA 140 138 135 137 134*  K 3.1* 3.6 3.5 3.8 3.5  CL 101 98 97* 98 96*  CO2 27 24 26 23 24   GLUCOSE 129* 194* 216* 108* 74  BUN 36* 39* 40* 44* 32*  CREATININE 9.26* 9.38* 9.65* 11.01* 7.77*  CALCIUM 8.6* 9.2 8.8* 8.5* 7.7*  MG  --  2.2  2.1 2.2  --   PHOS  --   --  6.5*  --   --      Recent Results (from the past 240 hour(s))  Blood Culture (routine x 2)     Status: None (Preliminary result)   Collection Time: 03/29/23  8:47 AM   Specimen: BLOOD RIGHT ARM  Result Value Ref Range Status   Specimen Description BLOOD RIGHT ARM  Final   Special Requests   Final    BOTTLES DRAWN AEROBIC AND ANAEROBIC Blood Culture adequate volume   Culture   Final    NO GROWTH 3 DAYS Performed at [facility], [address] [place], [place] [address]    Report Status PENDING  Incomplete  MRSA Next Gen by PCR, Nasal     Status: None   Collection Time: 03/29/23  7:25 PM   Specimen: Nasal Mucosa; Nasal Swab  Result Value Ref Range Status   MRSA by PCR Next Gen NOT DETECTED NOT DETECTED Final    Comment: (NOTE) The GeneXpert MRSA Assay (FDA approved for NASAL specimens only), is one component of a comprehensive MRSA colonization surveillance program. It is not intended to diagnose MRSA infection nor to guide or monitor treatment for MRSA infections. Test performance is not FDA approved in patients less than 35 years old. Performed at [facility], [address], [place], [place] [address]   Blood  Culture (routine x 2)     Status: None (Preliminary result)   Collection Time: 03/29/23  9:20 PM   Specimen: BLOOD RIGHT HAND  Result Value Ref Range Status   Specimen Description BLOOD RIGHT HAND  Final   Special Requests   Final    BOTTLES DRAWN AEROBIC AND ANAEROBIC Blood Culture adequate volume   Culture   Final    NO GROWTH 3 DAYS Performed at [facility], [address], [place], [place] [address]    Report Status PENDING  Incomplete  Aerobic/Anaerobic Culture w Gram Stain (surgical/deep wound)     Status: None (Preliminary result)   Collection Time: 03/30/23  8:19 AM   Specimen: Abscess  Result Value Ref Range Status   Specimen Description ABSCESS  Final   Special Requests LEFT ISCHIAL  Final   Gram Stain   Final    RARE WBC PRESENT, PREDOMINANTLY PMN NO ORGANISMS SEEN    Culture   Final    NO GROWTH 2 DAYS NO ANAEROBES ISOLATED; CULTURE IN PROGRESS FOR 5 DAYS Performed at [facility], [address] [place], [place] [address]    Report Status PENDING  Incomplete     Radiology Studies: No results found.  Scheduled Meds:  amiodarone  200 mg Oral Daily   atorvastatin  40 mg Oral QHS   Chlorhexidine Gluconate Cloth  6 each Topical Daily   cinacalcet  30 mg Oral Daily   droxidopa  100 mg Oral TID WC   feeding supplement  237 mL Oral BID BM   heparin  5,000 Units Subcutaneous Q8H   leptospermum manuka honey  1 Application Topical Daily   midodrine  20 mg Oral Q8H   pantoprazole  40 mg Oral BID AC   pregabalin  75 mg Oral BID   sevelamer carbonate  800 mg Oral BID   Continuous Infusions:  sodium chloride     anticoagulant sodium citrate     norepinephrine (LEVOPHED) Adult infusion Stopped (04/01/23 1059)   piperacillin-tazobactam (ZOSYN)  IV 2.25 g (04/02/23  4098)     LOS: 4 days   Burnadette Pop, MD [facility] P9/20/2024, 8:06 AM

## 2023-04-02 NOTE — TOC Benefit Eligibility Note (Signed)
Patient Product/process development scientist completed.    The patient is insured through [facility]. Patient has Medicare and is not eligible for a copay card, but may be able to apply for patient assistance, if available.    Ran test claim for droxidopa (Northera) 100 mg capsules and Requires Prior Authorization   This test claim was processed through [facility]- copay amounts may vary at other pharmacies due to [employer], or as the patient moves through the different stages of their insurance plan.     Roland Earl, CPHT Pharmacy Technician III Certified Patient Advocate [facility] Patient Advocate Team Direct Number: [phone]  Fax: [phone]

## 2023-04-02 NOTE — Progress Notes (Signed)
   04/02/23 1148  Vitals  Temp 97.8 F (36.6 C)  Pulse Rate (!) 117  Resp 13  BP 93/68  SpO2 99 %  Post Treatment  Dialyzer Clearance Clear  Hemodialysis Intake (mL) 0 mL  Liters Processed 84  Fluid Removed (mL) 1800 mL  Tolerated HD Treatment Yes   Received patient in bed to unit.  Alert and oriented.  Informed consent signed and in chart.   TX duration:3.5hrs  Patient tolerated well.  Transported back to the room  Alert, without acute distress.  Hand-off given to patient's nurse.   Access used: [facility] Access issues: NONE  Total UF removed: 1.8L Medication(s) given: NONE    [facility]

## 2023-04-03 DIAGNOSIS — I959 Hypotension, unspecified: Secondary | ICD-10-CM | POA: Diagnosis not present

## 2023-04-03 LAB — CULTURE, BLOOD (ROUTINE X 2)
Culture: NO GROWTH
Culture: NO GROWTH
Special Requests: ADEQUATE
Special Requests: ADEQUATE

## 2023-04-03 LAB — PHOSPHORUS: Phosphorus: 4.9 mg/dL — ABNORMAL HIGH (ref 2.5–4.6)

## 2023-04-03 MED ORDER — SODIUM CHLORIDE 0.9 % IV BOLUS
500.0000 mL | INTRAVENOUS | Status: AC | PRN
  Administered 2023-04-03 (×3): 500 mL via INTRAVENOUS

## 2023-04-03 NOTE — Progress Notes (Addendum)
[facility] Progress Note  Subjective: seen in HD unit. No c/o's. 1.8 L UF yest on HD, overnight BP's dropped into the 70's.    Vitals:   04/03/23 1010 04/03/23 1030 04/03/23 1100 04/03/23 1115  BP: (!) 80/53 (!) 78/49 (!) 80/59 (!) 75/48  Pulse:      Resp: 18 13 14 14   Temp:      TempSrc:      SpO2:      Weight:      Height:        Exam: Gen alert, no distress, on RA No jvd or bruits Chest clear bilat to bases RRR no RG Abd soft ntnd no mass or ascites +bs Ext no LE edema Neuro is alert, Ox 3 , nf    RIJ TDC intact     Home meds include - lipitor, rocaltrol, midodrine 20 tid, protonix, pregabalin 75 hs, amiodarone, sensipar 30 every day, renvela 800 bid ac, trazodone, prns        [facility] HD: [facility] 3h  450/ 1.5  77.5kg  2/2.5 bath   R TDC   Heparin none - last OP HD 9/13, post wt 80kg - lowest in-session BP's have been SBP 40s- 80s last 2 wks, maybe chronic issue - coming off 3-4kg over dry wt the last 2 wks - mircera 75 q 2 wks, last 9/09, due 9/23   CXR - L retrocardiac infiltrate, no edema CT chest - sig diffuse ground glass changes, some L base infiltration     Assessment/ Plan: Shock - acute on chronic hypotension. Ischial abscess drained by IR 9/18. Getting IV abx per pmd. BP worse after HD w/ 1.8 L UF yesterday. Will bolus NS 500 cc (up to 3 times) to get SBP back up over 85 hopefully. His chronic BP's are quite low while on dialysis, pre and post HD BP's though are not as bad.  Also CCM team said yesterday that MAP of > 55 is sufficient cutoff for pressor use, so I updated the levophed order.  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 Missed outpt HD 9/16. HD here Wed and Friday. Next HD Monday.  Volume - admit CXR no edema. 1-2kg up today, mild LE edema. Low BP's will not allow [place] to treat LE edema at this time.  Chronic hypotension - on midodrine 20 tid here as at home. Droxidopa cancelled due to high outpatient costs.  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 Next esa due on  9/23. Follow.  MBD ckd - CCa and phos are in range. Continuing binder and sensipar.  DM2 - per CCM     Rob Arlean Hopping MD  [facility] 04/03/2023, 11:32 AM  Recent Labs  Lab 03/29/23 0847 03/29/23 2120 03/30/23 0251 03/31/23 0350 04/01/23 0223 04/02/23 0304  HGB 9.8*  --  9.1* 9.9* 10.0* 10.3*  ALBUMIN 2.5*  --   --   --   --   --   CALCIUM 8.6*   < > 8.8* 8.5*  --  7.7*  PHOS  --   --  6.5*  --   --   --   CREATININE 9.26*   < > 9.65* 11.01*  --  7.77*  K 3.1*   < > 3.5 3.8  --  3.5   < > = values in this interval not displayed.   No results for input(s): "IRON", "TIBC", "FERRITIN" in the last 168 hours. Inpatient medications:  amiodarone  200 mg Oral Daily   atorvastatin  40  mg Oral QHS   Chlorhexidine Gluconate Cloth  6 each Topical Daily   cinacalcet  30 mg Oral Daily   feeding supplement  237 mL Oral BID BM   heparin  5,000 Units Subcutaneous Q8H   leptospermum manuka honey  1 Application Topical Daily   midodrine  20 mg Oral Q8H   pantoprazole  40 mg Oral BID AC   pregabalin  75 mg Oral BID   sevelamer carbonate  800 mg Oral BID    sodium chloride     anticoagulant sodium citrate     norepinephrine (LEVOPHED) Adult infusion Stopped (04/01/23 1059)   piperacillin-tazobactam (ZOSYN)  IV Stopped (04/03/23 0542)   sodium chloride 500 mL (04/03/23 0930)   acetaminophen, alteplase, anticoagulant sodium citrate, docusate sodium, feeding supplement (NEPRO CARB STEADY), heparin, lidocaine (PF), lidocaine-prilocaine, mouth rinse, pentafluoroprop-tetrafluoroeth, polyethylene glycol, sodium chloride, traZODone

## 2023-04-03 NOTE — Plan of Care (Signed)

## 2023-04-03 NOTE — Progress Notes (Signed)
PROGRESS NOTE  Justin [name]  QIH:[id number] DOB: February 21, 1946 DOA: 03/29/2023 PCP: [name], DO   Brief Narrative: Patient is a 77 year old male with history of ESRD on dialysis, GI bleed, rectal abscess, cholecystitis with percutaneous drain, DM, A-fib on Eliquis who presented from hemodialysis unit for the evaluation of hypotension.  He required pressors in the emergency department.  Admitted under PCCM service.  His BP runs chronically low and administered midodrine.  Also has chronic diarrhea.  Started on IV fluid and Levophed in the emergency department.  Also found to have possible abscess in the left ischial area, about 3.5 cm.  Underwent CT guided aspiration of small left ischial abscess by IR on 9/17.  After hemodynamically stability, pressures was discontinued, patient transferred to our service on 9/20.  Hospital course  remarkable for persistent low blood pressure  Assessment & Plan:  Principal Problem:   Hypotension  Acute on chronic hypotension:  Patient has history of chronic hypotension.  Also suspected to have septic shock secondary to abscess.  Also has chronic diarrhea and was having lower oral intake.  Pressures and fluid started on ED.  Currently off pressors.  Continue midodrine.  Droxidopa discontinued because this medicine is very expensive.  Patient is afebrile, sepsis physiology has resolved.  Blood pressure remains low today with MAP of 56 but patient is asymptomatic.  He denies any lightheadedness or dizziness.  Not sure what options we have for increasing the blood pressure.  Left ischial abscess: About 3 cm.  Status post CT-guided drainage.  Wound culture did not show any organism.  Plan for continue with Zosyn for now and discharge on Augmentin when ready.  Blood cultures have been negative so far  History of cholecystectomy: Has right upper quadrant percutaneous cholecystostomy tube.  He gets regular exchange  of this tube at [facility].  He follows with [facility].   He has also been referred to general surgery for the consideration of cholecystectomy.  ESRD/hyperkalemia: Nephrology following for dialysis.  Paroxysmal A-fib: On amiodarone.  Not on anticoagulant due to history of recent GI bleed  Type 2 diabetes: Currently on sliding scale.  Monitor blood sugar  Chronic anemia: Currently hemoglobin stable.  Most likely asscoaited with  history with ESRD.  Deconditioning/debility: Patient wheelchair-bound.  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  Lives with niece        Pressure Injury 12/14/22 Heel Left;Posterior Unstageable - Full thickness tissue loss in which the base of the injury is covered by slough (yellow, tan, gray, green or brown) and/or eschar (tan, brown or black) in the wound bed. (Active)  12/14/22 0700  Location: Heel  Location Orientation: Left;Posterior  Staging: Unstageable - Full thickness tissue loss in which the base of the injury is covered by slough (yellow, tan, gray, green or brown) and/or eschar (tan, brown or black) in the wound bed.  Wound Description (Comments):   Present on Admission: Yes  Dressing Type Foam - Lift dressing to assess site every shift 04/03/23 0808     Pressure Injury 12/14/22 Heel Right Unstageable - Full thickness tissue loss in which the base of the injury is covered by slough (yellow, tan, gray, green or brown) and/or eschar (tan, brown or black) in the wound bed. (Active)  12/14/22 0700  Location: Heel  Location Orientation: Right  Staging: Unstageable - Full thickness tissue loss in which the base of the injury is covered by slough (yellow, tan, gray, green or brown) and/or eschar (tan, brown or  black) in the wound bed.  Wound Description (Comments):   Present on Admission: Yes  Dressing Type Foam - Lift dressing to assess site every shift 04/03/23 0808    DVT prophylaxis:heparin injection 5,000 Units Start: 03/31/23 0600 SCDs Start: 03/29/23 1754     Code Status: Do  not attempt resuscitation (DNR) PRE-ARREST INTERVENTIONS DESIRED  Family Communication: None at bedside  Patient status:Inpatient  Patient is from :Home  Anticipated discharge ZH:YQMV  Estimated DC date:after improvement in the blood pressure   Consultants: PCCM, nephrology  Procedures: CT-guided abscess drainage  Antimicrobials:  Anti-infectives (From admission, onward)    Start     Dose/Rate Route Frequency Ordered Stop   03/30/23 1400  piperacillin-tazobactam (ZOSYN) IVPB 2.25 g        2.25 g 100 mL/hr over 30 Minutes Intravenous Every 8 hours 03/30/23 0801     03/30/23 0900  vancomycin (VANCOREADY) IVPB 1750 mg/350 mL  Status:  Discontinued        1,750 mg 175 mL/hr over 120 Minutes Intravenous  Once 03/30/23 0807 03/30/23 0842   03/29/23 1600  ceFEPIme (MAXIPIME) 2 g in sodium chloride 0.9 % 100 mL IVPB        2 g 200 mL/hr over 30 Minutes Intravenous  Once 03/29/23 1554 03/29/23 1736   03/29/23 1600  vancomycin (VANCOREADY) IVPB 1750 mg/350 mL  Status:  Discontinued        1,750 mg 175 mL/hr over 120 Minutes Intravenous  Once 03/29/23 1554 03/30/23 7846       Subjective: Patient seen and examined at bedside today.  Blood pressure remains low with systolic in the range of 70s but patient is asymptomatic, lying in bed and comfortable.  Not tachycardic.  Denies lightheadedness, discomfort or dizziness Objective: Vitals:   04/03/23 1010 04/03/23 1030 04/03/23 1100 04/03/23 1115  BP: (!) 80/53 (!) 78/49 (!) 80/59 (!) 75/48  Pulse:      Resp: 18 13 14 14   Temp:      TempSrc:      SpO2:      Weight:      Height:        Intake/Output Summary (Last 24 hours) at 04/03/2023 1135 Last data filed at 04/03/2023 0600 Gross per 24 hour  Intake 400 ml  Output 2225 ml  Net -1825 ml   Filed Weights   04/01/23 0417 04/02/23 0500 04/03/23 0500  Weight: 86.4 kg 83.1 kg 81.6 kg    Examination: General exam: Overall comfortable, not in distress, appears  deconditioned HEENT: PERRL Respiratory system:  no wheezes or crackles  Cardiovascular system: S1 & S2 heard, RRR.  Dressing is in the right chest Gastrointestinal system: Abdomen is nondistended, soft and nontender.  Right upper quadrant drain Central nervous system: Alert and oriented Extremities: No edema, no clubbing ,no cyanosis Skin: No rashes, no ulcers,no icterus     Data Reviewed: I have personally reviewed following labs and imaging studies  CBC: Recent Labs  Lab 03/29/23 0847 03/30/23 0251 03/31/23 0350 04/01/23 0223 04/02/23 0304  WBC 8.3 8.5 9.9 7.9 6.7  NEUTROABS 6.2  --   --   --   --   HGB 9.8* 9.1* 9.9* 10.0* 10.3*  HCT 32.5* 29.7* 31.5* 31.9* 33.3*  MCV 100.9* 98.3 99.7 97.6 97.4  PLT 166 186 187 192 180   Basic Metabolic Panel: Recent Labs  Lab 03/29/23 0847 03/29/23 2120 03/30/23 0251 03/31/23 0350 04/02/23 0304  NA 140 138 135 137 134*  K 3.1* 3.6  3.5 3.8 3.5  CL 101 98 97* 98 96*  CO2 27 24 26 23 24   GLUCOSE 129* 194* 216* 108* 74  BUN 36* 39* 40* 44* 32*  CREATININE 9.26* 9.38* 9.65* 11.01* 7.77*  CALCIUM 8.6* 9.2 8.8* 8.5* 7.7*  MG  --  2.2 2.1 2.2  --   PHOS  --   --  6.5*  --   --      Recent Results (from the past 240 hour(s))  Blood Culture (routine x 2)     Status: None   Collection Time: 03/29/23  8:47 AM   Specimen: BLOOD RIGHT ARM  Result Value Ref Range Status   Specimen Description BLOOD RIGHT ARM  Final   Special Requests   Final    BOTTLES DRAWN AEROBIC AND ANAEROBIC Blood Culture adequate volume   Culture   Final    NO GROWTH 5 DAYS Performed at [facility], [address], [place], [place] [address]    Report Status 04/03/2023 FINAL  Final  MRSA Next Gen by PCR, Nasal     Status: None   Collection Time: 03/29/23  7:25 PM   Specimen: Nasal Mucosa; Nasal Swab  Result Value Ref Range Status   MRSA by PCR Next Gen NOT DETECTED NOT DETECTED Final    Comment: (NOTE) The GeneXpert MRSA Assay (FDA approved for  NASAL specimens only), is one component of a comprehensive MRSA colonization surveillance program. It is not intended to diagnose MRSA infection nor to guide or monitor treatment for MRSA infections. Test performance is not FDA approved in patients less than 105 years old. Performed at [facility], [address], [place], [place] [address]   Blood Culture (routine x 2)     Status: None   Collection Time: 03/29/23  9:20 PM   Specimen: BLOOD RIGHT HAND  Result Value Ref Range Status   Specimen Description BLOOD RIGHT HAND  Final   Special Requests   Final    BOTTLES DRAWN AEROBIC AND ANAEROBIC Blood Culture adequate volume   Culture   Final    NO GROWTH 5 DAYS Performed at [facility], [address], [place], [place] [address]    Report Status 04/03/2023 FINAL  Final  Aerobic/Anaerobic Culture w Gram Stain (surgical/deep wound)     Status: None (Preliminary result)   Collection Time: 03/30/23  8:19 AM   Specimen: Abscess  Result Value Ref Range Status   Specimen Description ABSCESS  Final   Special Requests LEFT ISCHIAL  Final   Gram Stain   Final    RARE WBC PRESENT, PREDOMINANTLY PMN NO ORGANISMS SEEN    Culture   Final    NO GROWTH 3 DAYS NO ANAEROBES ISOLATED; CULTURE IN PROGRESS FOR 5 DAYS Performed at [facility], [address] [place], [place] [address]    Report Status PENDING  Incomplete     Radiology Studies: No results found.  Scheduled Meds:  amiodarone  200 mg Oral Daily   atorvastatin  40 mg Oral QHS   Chlorhexidine Gluconate Cloth  6 each Topical Daily   cinacalcet  30 mg Oral Daily   feeding supplement  237 mL Oral BID BM   heparin  5,000 Units Subcutaneous Q8H   leptospermum manuka honey  1 Application Topical Daily   midodrine  20 mg Oral Q8H   pantoprazole  40 mg Oral BID AC   pregabalin  75 mg Oral BID   sevelamer  carbonate  800 mg Oral BID   Continuous Infusions:  sodium chloride     anticoagulant sodium citrate      norepinephrine (LEVOPHED) Adult infusion Stopped (04/01/23 1059)   piperacillin-tazobactam (ZOSYN)  IV Stopped (04/03/23 0542)   sodium chloride 500 mL (04/03/23 1134)     LOS: 5 days   Burnadette Pop, MD [facility] P9/21/2024, 11:35 AM

## 2023-04-04 DIAGNOSIS — I959 Hypotension, unspecified: Secondary | ICD-10-CM | POA: Diagnosis not present

## 2023-04-04 LAB — AEROBIC/ANAEROBIC CULTURE W GRAM STAIN (SURGICAL/DEEP WOUND): Culture: NO GROWTH

## 2023-04-04 LAB — GLUCOSE, CAPILLARY: Glucose-Capillary: 71 mg/dL (ref 70–99)

## 2023-04-04 MED ORDER — CHLORHEXIDINE GLUCONATE CLOTH 2 % EX PADS: 6.0000 | MEDICATED_PAD | Freq: Every day | CUTANEOUS | Status: DC

## 2023-04-04 NOTE — Progress Notes (Signed)
[facility] Progress Note  Subjective: seen in HD unit. Much more alert today.   Vitals:   04/04/23 0745 04/04/23 0800 04/04/23 0830 04/04/23 0900  BP:  (!) 84/54 (!) 86/52 95/65  Pulse: 64 74 72 66  Resp: 15 16 20 16   Temp:      TempSrc:      SpO2: 98% 98% 97% 99%  Weight:      Height:        Exam: Gen alert, no distress, on RA No jvd or bruits Chest clear bilat to bases RRR no RG Abd soft ntnd no mass or ascites +bs Ext no LE edema Neuro is alert, Ox 3 , nf    RIJ TDC intact     Home meds include - lipitor, rocaltrol, midodrine 20 tid, protonix, pregabalin 75 hs, amiodarone, sensipar 30 every day, renvela 800 bid ac, trazodone, prns        OP HD: [facility] M[facility] 3h  450/ 1.5  77.5kg  2/2.5 bath   R TDC   Heparin none - last OP HD 9/13, post wt 80kg - lowest in-session BP's have been SBP 40s- 80s last 2 wks, maybe chronic issue - coming off 3-4kg over dry wt the last 2 wks - mircera 75 q 2 wks, last 9/09, due 9/23   CXR - L retrocardiac infiltrate, no edema CT chest - sig diffuse ground glass changes, some L base infiltration     Assessment/ Plan: Shock - acute on chronic hypotension. Ischial abscess drained by IR 9/18. Getting IV abx per pmd. BP worsened significantly (w/ decrease in MS as well) after 1.8 L UF /w HD Friday. We gave back 1.5 L back in 500 cc bolus's yesterday and cerebral perfusion (and BP's) are sig better today. Up in bed and joking around.  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 Missed outpt HD 9/16. HD here Wed and Friday. Next HD Monday.  Volume - admit CXR no edema, no sig LE edema today. Over dry wt but clinically looks euvolemic and did not do well after pulling fluid on Friday. Will keep even w/ HD tomorrow.  Chronic hypotension - cont midodrine 20 tid here as at home. Droxidopa cancelled due to high outpatient costs.  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 Next esa due on 9/23. Follow.  MBD ckd - CCa and phos are in range. Continuing binder and sensipar.   DM2 - per CCM H/o cholecystectomy - w/ RUQ perc chole tube in place. F/u GI in OP setting.  Debility - pt is WC bound at home.      Vinson Moselle MD  [facility] 04/04/2023, 11:38 AM  Recent Labs  Lab 03/29/23 0847 03/29/23 2120 03/30/23 0251 03/31/23 0350 04/01/23 0223 04/02/23 0304 04/03/23 1547  HGB 9.8*  --  9.1* 9.9* 10.0* 10.3*  --   ALBUMIN 2.5*  --   --   --   --   --   --   CALCIUM 8.6*   < > 8.8* 8.5*  --  7.7*  --   PHOS  --   --  6.5*  --   --   --  4.9*  CREATININE 9.26*   < > 9.65* 11.01*  --  7.77*  --   K 3.1*   < > 3.5 3.8  --  3.5  --    < > = values in this interval not displayed.   No results for input(s): "IRON", "TIBC", "FERRITIN" in the last 168 hours. Inpatient  medications:  amiodarone  200 mg Oral Daily   atorvastatin  40 mg Oral QHS   Chlorhexidine Gluconate Cloth  6 each Topical Daily   cinacalcet  30 mg Oral Daily   feeding supplement  237 mL Oral BID BM   heparin  5,000 Units Subcutaneous Q8H   leptospermum manuka honey  1 Application Topical Daily   midodrine  20 mg Oral Q8H   pantoprazole  40 mg Oral BID AC   pregabalin  75 mg Oral BID   sevelamer carbonate  800 mg Oral BID    sodium chloride     anticoagulant sodium citrate     norepinephrine (LEVOPHED) Adult infusion Stopped (04/01/23 1059)   piperacillin-tazobactam (ZOSYN)  IV 2.25 g (04/04/23 0518)   acetaminophen, alteplase, anticoagulant sodium citrate, docusate sodium, feeding supplement (NEPRO CARB STEADY), heparin, lidocaine (PF), lidocaine-prilocaine, mouth rinse, pentafluoroprop-tetrafluoroeth, polyethylene glycol, traZODone

## 2023-04-04 NOTE — Progress Notes (Signed)
PROGRESS NOTE  Justin [name]  YSA:[id number] DOB: [birth year] DOA: 03/29/2023 PCP: [name], DO   Brief Narrative: Patient is a 77 year old male with history of ESRD on dialysis, GI bleed, rectal abscess, cholecystitis with percutaneous drain, DM, A-fib on Eliquis who presented from hemodialysis unit for the evaluation of hypotension.  He required pressors in the emergency department.  Admitted under PCCM service.  His BP runs chronically low and administered midodrine.  Also has chronic diarrhea.  Started on IV fluid and Levophed in the emergency department.  Also found to have possible abscess in the left ischial area, about 3.5 cm.  Underwent CT guided aspiration of small left ischial abscess by IR on 9/17.  After hemodynamically stability, pressures was discontinued, patient transferred to our service on 9/20.  Hospital course  remarkable for persistent low blood pressure.  Blood pressure better today after giving IV fluid boluses  Assessment & Plan:  Principal Problem:   Hypotension  Acute on chronic hypotension:  Patient has history of chronic hypotension.  Also suspected to have septic shock secondary to abscess.  Also has chronic diarrhea and was having lower oral intake.  Pressures and fluid started on ED.  Currently off pressors.  Continue midodrine.  Droxidopa discontinued because this medicine is very expensive.  Patient is afebrile, sepsis physiology has resolved.  Patient is asymptomatic.  He denies any lightheadedness or dizziness.  Give consulted IV fluid boluses yesterday by nephrology.  Blood pressure better today with systolic in the range of 90s  Left ischial abscess: About 3 cm.  Status post CT-guided drainage.  Wound culture did not show any organism.  Plan for continue with Zosyn for now and discharge on Augmentin when ready.  Blood cultures have been negative so far  History of cholecystectomy: Has right upper quadrant percutaneous cholecystostomy tube.  He gets regular  exchange  of this tube at [name].  He follows with [facility].  He has also been referred to general surgery for the consideration of cholecystectomy.  ESRD/hyperkalemia: Nephrology following for dialysis.  Paroxysmal A-fib: On amiodarone.  Not on anticoagulant due to history of recent GI bleed  Type 2 diabetes: Currently on sliding scale.  Monitor blood sugar  Chronic anemia: Currently hemoglobin stable.  Most likely asscoaited with  history with ESRD.  Deconditioning/debility: Patient wheelchair-bound.  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  Lives with niece        Pressure Injury 12/14/22 Heel Left;Posterior Unstageable - Full thickness tissue loss in which the base of the injury is covered by slough (yellow, tan, gray, green or brown) and/or eschar (tan, brown or black) in the wound bed. (Active)  12/14/22 0700  Location: Heel  Location Orientation: Left;Posterior  Staging: Unstageable - Full thickness tissue loss in which the base of the injury is covered by slough (yellow, tan, gray, green or brown) and/or eschar (tan, brown or black) in the wound bed.  Wound Description (Comments):   Present on Admission: Yes  Dressing Type Foam - Lift dressing to assess site every shift 04/04/23 0400     Pressure Injury 12/14/22 Heel Right Unstageable - Full thickness tissue loss in which the base of the injury is covered by slough (yellow, tan, gray, green or brown) and/or eschar (tan, brown or black) in the wound bed. (Active)  12/14/22 0700  Location: Heel  Location Orientation: Right  Staging: Unstageable - Full thickness tissue loss in which the base of the injury is covered by slough (yellow, tan,  gray, green or brown) and/or eschar (tan, brown or black) in the wound bed.  Wound Description (Comments):   Present on Admission: Yes  Dressing Type Foam - Lift dressing to assess site every shift 04/04/23 0400    DVT prophylaxis:heparin injection 5,000 Units Start:  03/31/23 0600 SCDs Start: 03/29/23 1754     Code Status: Do not attempt resuscitation (DNR) PRE-ARREST INTERVENTIONS DESIRED  Family Communication: None at bedside  Patient status:Inpatient  Patient is from :Home  Anticipated discharge NG:EXBM  Estimated DC date: Tomorrow if he tolerates dialysis with blood pressure improvement   Consultants: PCCM, nephrology  Procedures: CT-guided abscess drainage  Antimicrobials:  Anti-infectives (From admission, onward)    Start     Dose/Rate Route Frequency Ordered Stop   03/30/23 1400  piperacillin-tazobactam (ZOSYN) IVPB 2.25 g        2.25 g 100 mL/hr over 30 Minutes Intravenous Every 8 hours 03/30/23 0801     03/30/23 0900  vancomycin (VANCOREADY) IVPB 1750 mg/350 mL  Status:  Discontinued        1,750 mg 175 mL/hr over 120 Minutes Intravenous  Once 03/30/23 0807 03/30/23 0842   03/29/23 1600  ceFEPIme (MAXIPIME) 2 g in sodium chloride 0.9 % 100 mL IVPB        2 g 200 mL/hr over 30 Minutes Intravenous  Once 03/29/23 1554 03/29/23 1736   03/29/23 1600  vancomycin (VANCOREADY) IVPB 1750 mg/350 mL  Status:  Discontinued        1,750 mg 175 mL/hr over 120 Minutes Intravenous  Once 03/29/23 1554 03/30/23 8413       Subjective: Patient seen and examined at bedside today.  Hemodynamically stable.  Blood pressure better today.  He is comfortable without any complaints   objective: Vitals:   04/04/23 0745 04/04/23 0800 04/04/23 0830 04/04/23 0900  BP:  (!) 84/54 (!) 86/52 95/65  Pulse: 64 74 72 66  Resp: 15 16 20 16   Temp:      TempSrc:      SpO2: 98% 98% 97% 99%  Weight:      Height:        Intake/Output Summary (Last 24 hours) at 04/04/2023 1108 Last data filed at 04/04/2023 0518 Gross per 24 hour  Intake 1802.71 ml  Output 150 ml  Net 1652.71 ml   Filed Weights   04/02/23 0500 04/03/23 0500 04/04/23 0500  Weight: 83.1 kg 81.6 kg 81.3 kg    Examination: General exam: Overall comfortable, not in distress, lying in  bed HEENT: PERRL Respiratory system:  no wheezes or crackles  Cardiovascular system: S1 & S2 heard, RRR.  Dialysis catheter on the right chest Gastrointestinal system: Abdomen is nondistended, soft and nontender.  Right upper quadrant Central nervous system: Alert and oriented, bilateral lower extremity weakness Extremities: No edema, no clubbing ,no cyanosis Skin: No rashes, no ulcers,no icterus     Data Reviewed: I have personally reviewed following labs and imaging studies  CBC: Recent Labs  Lab 03/29/23 0847 03/30/23 0251 03/31/23 0350 04/01/23 0223 04/02/23 0304  WBC 8.3 8.5 9.9 7.9 6.7  NEUTROABS 6.2  --   --   --   --   HGB 9.8* 9.1* 9.9* 10.0* 10.3*  HCT 32.5* 29.7* 31.5* 31.9* 33.3*  MCV 100.9* 98.3 99.7 97.6 97.4  PLT 166 186 187 192 180   Basic Metabolic Panel: Recent Labs  Lab 03/29/23 0847 03/29/23 2120 03/30/23 0251 03/31/23 0350 04/02/23 0304 04/03/23 1547  NA 140 138 135 137 134*  --  K 3.1* 3.6 3.5 3.8 3.5  --   CL 101 98 97* 98 96*  --   CO2 27 24 26 23 24   --   GLUCOSE 129* 194* 216* 108* 74  --   BUN 36* 39* 40* 44* 32*  --   CREATININE 9.26* 9.38* 9.65* 11.01* 7.77*  --   CALCIUM 8.6* 9.2 8.8* 8.5* 7.7*  --   MG  --  2.2 2.1 2.2  --   --   PHOS  --   --  6.5*  --   --  4.9*     Recent Results (from the past 240 hour(s))  Blood Culture (routine x 2)     Status: None   Collection Time: 03/29/23  8:47 AM   Specimen: BLOOD RIGHT ARM  Result Value Ref Range Status   Specimen Description BLOOD RIGHT ARM  Final   Special Requests   Final    BOTTLES DRAWN AEROBIC AND ANAEROBIC Blood Culture adequate volume   Culture   Final    NO GROWTH 5 DAYS Performed at [facility], [address] [place], [place] [address]    Report Status 04/03/2023 FINAL  Final  MRSA Next Gen by PCR, Nasal     Status: None   Collection Time: 03/29/23  7:25 PM   Specimen: Nasal Mucosa; Nasal Swab  Result Value Ref Range Status   MRSA by PCR Next Gen NOT  DETECTED NOT DETECTED Final    Comment: (NOTE) The GeneXpert MRSA Assay (FDA approved for NASAL specimens only), is one component of a comprehensive MRSA colonization surveillance program. It is not intended to diagnose MRSA infection nor to guide or monitor treatment for MRSA infections. Test performance is not FDA approved in patients less than 68 years old. Performed at [facility], [address], [place], [place] [address]   Blood Culture (routine x 2)     Status: None   Collection Time: 03/29/23  9:20 PM   Specimen: BLOOD RIGHT HAND  Result Value Ref Range Status   Specimen Description BLOOD RIGHT HAND  Final   Special Requests   Final    BOTTLES DRAWN AEROBIC AND ANAEROBIC Blood Culture adequate volume   Culture   Final    NO GROWTH 5 DAYS Performed at [facility], [address] [place], [place] [address]    Report Status 04/03/2023 FINAL  Final  Aerobic/Anaerobic Culture w Gram Stain (surgical/deep wound)     Status: None (Preliminary result)   Collection Time: 03/30/23  8:19 AM   Specimen: Abscess  Result Value Ref Range Status   Specimen Description ABSCESS  Final   Special Requests LEFT ISCHIAL  Final   Gram Stain   Final    RARE WBC PRESENT, PREDOMINANTLY PMN NO ORGANISMS SEEN    Culture   Final    NO GROWTH 4 DAYS NO ANAEROBES ISOLATED; CULTURE IN PROGRESS FOR 5 DAYS Performed at [facility], [address], [place], [place] [address]    Report Status PENDING  Incomplete     Radiology Studies: No results found.  Scheduled Meds:  amiodarone  200 mg Oral Daily   atorvastatin  40 mg Oral QHS   Chlorhexidine Gluconate Cloth  6 each Topical Daily   cinacalcet  30 mg Oral Daily   feeding supplement  237 mL Oral BID BM   heparin  5,000 Units Subcutaneous Q8H   leptospermum manuka honey  1 Application Topical Daily  midodrine  20 mg Oral Q8H   pantoprazole  40 mg Oral BID AC   pregabalin  75 mg Oral BID   sevelamer carbonate  800 mg  Oral BID   Continuous Infusions:  sodium chloride     anticoagulant sodium citrate     norepinephrine (LEVOPHED) Adult infusion Stopped (04/01/23 1059)   piperacillin-tazobactam (ZOSYN)  IV 2.25 g (04/04/23 0518)     LOS: 6 days   Burnadette Pop, MD [facility] P9/22/2024, 11:08 AM

## 2023-04-05 DIAGNOSIS — I959 Hypotension, unspecified: Secondary | ICD-10-CM | POA: Diagnosis not present

## 2023-04-05 LAB — RENAL FUNCTION PANEL
Albumin: 2.1 g/dL — ABNORMAL LOW (ref 3.5–5.0)
Anion gap: 12 (ref 5–15)
BUN: 46 mg/dL — ABNORMAL HIGH (ref 8–23)
CO2: 23 mmol/L (ref 22–32)
Calcium: 7.7 mg/dL — ABNORMAL LOW (ref 8.9–10.3)
Chloride: 103 mmol/L (ref 98–111)
Creatinine, Ser: 8.73 mg/dL — ABNORMAL HIGH (ref 0.61–1.24)
GFR, Estimated: 6 mL/min — ABNORMAL LOW (ref >60)
Glucose, Bld: 140 mg/dL — ABNORMAL HIGH (ref 70–99)
Phosphorus: 4.4 mg/dL (ref 2.5–4.6)
Potassium: 3.6 mmol/L (ref 3.5–5.1)
Sodium: 138 mmol/L (ref 135–145)

## 2023-04-05 LAB — CBC
HCT: 31.9 % — ABNORMAL LOW (ref 39.0–52.0)
Hemoglobin: 9.8 g/dL — ABNORMAL LOW (ref 13.0–17.0)
MCH: 31 pg (ref 26.0–34.0)
MCHC: 30.7 g/dL (ref 30.0–36.0)
MCV: 100.9 fL — ABNORMAL HIGH (ref 80.0–100.0)
Platelets: 186 10*3/uL (ref 150–400)
RBC: 3.16 MIL/uL — ABNORMAL LOW (ref 4.22–5.81)
RDW: 16.5 % — ABNORMAL HIGH (ref 11.5–15.5)
WBC: 8.9 10*3/uL (ref 4.0–10.5)
nRBC: 0 % (ref 0.0–0.2)

## 2023-04-05 MED ORDER — AMOXICILLIN-POT CLAVULANATE 500-125 MG PO TABS
1.0000 | ORAL_TABLET | Freq: Two times a day (BID) | ORAL | Status: AC
  Administered 2023-04-05 (×2): 1 via ORAL
  Filled 2023-04-05 (×2): qty 1

## 2023-04-05 MED ORDER — LIDOCAINE-PRILOCAINE 2.5-2.5 % EX CREA: 1.0000 | TOPICAL_CREAM | CUTANEOUS | Status: DC | PRN

## 2023-04-05 MED ORDER — ANTICOAGULANT SODIUM CITRATE 4% (200MG/5ML) IV SOLN: 5.0000 mL | Status: DC | PRN

## 2023-04-05 MED ORDER — NEPRO/CARBSTEADY PO LIQD: 237.0000 mL | ORAL | Status: DC | PRN

## 2023-04-05 MED ORDER — DARBEPOETIN ALFA 60 MCG/0.3ML IJ SOSY
60.0000 ug | PREFILLED_SYRINGE | INTRAMUSCULAR | Status: DC
  Administered 2023-04-05: 60 ug via SUBCUTANEOUS
  Filled 2023-04-05: qty 0.3

## 2023-04-05 MED ORDER — LIDOCAINE HCL (PF) 1 % IJ SOLN: 5.0000 mL | INTRAMUSCULAR | Status: DC | PRN

## 2023-04-05 MED ORDER — PENTAFLUOROPROP-TETRAFLUOROETH EX AERO: 1.0000 | INHALATION_SPRAY | CUTANEOUS | Status: DC | PRN

## 2023-04-05 MED ORDER — HEPARIN SODIUM (PORCINE) 1000 UNIT/ML DIALYSIS: 1000.0000 [IU] | INTRAMUSCULAR | Status: DC | PRN

## 2023-04-05 MED ORDER — ALTEPLASE 2 MG IJ SOLR: 2.0000 mg | Freq: Once | INTRAMUSCULAR | Status: DC | PRN

## 2023-04-05 NOTE — Progress Notes (Signed)
Pt refusing mobility throughout shift as well as AM labs. Educated on importance of mobility to prevent skin breakdown and labs to monitor clinical progression. Will continue to monitor and encourage participation in care.

## 2023-04-05 NOTE — Progress Notes (Signed)
[facility] NEPHROLOGY PROGRESS NOTE  Assessment/ Plan: Pt is a 77 y.o. yo male   OP HD: [facility] M[facility] 3h  450/ 1.5  77.5kg  2/2.5 bath   R TDC   Heparin none - last OP HD 9/13, post wt 80kg - lowest in-session BP's have been SBP 40s- 80s last 2 wks, maybe chronic issue - coming off 3-4kg over dry wt the last 2 wks - mircera 75 q 2 wks, last 9/09, due 9/23  # Shock - acute on chronic hypotension. Ischial abscess drained by IR 9/18.  Antibiotics per primary team.  Currently on midodrine 20 mg 3 times daily.  # ESRD - MWF. HD today.   #Volume -euvolemic to dry. Will keep even w/ HD today.   #Chronic hypotension - cont midodrine 20 tid here as at home. Droxidopa cancelled due to high outpatient costs.   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 Continue ESA.    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 Continuing binder and sensipar.   Discussed the primary team.  Subjective: Seen and examined ICU.  No overnight event.  Plan for dialysis today.  Denies any complaint. Objective Vital signs in last 24 hours: Vitals:   04/05/23 0700 04/05/23 0743 04/05/23 0800 04/05/23 0900  BP: 103/68  116/69 (!) 94/54  Pulse: 66  70 77  Resp: 14  14 14   Temp:  (!) 96.9 F (36.1 C)    TempSrc:  Axillary    SpO2: 90%  95% 93%  Weight:      Height:       Weight change: 0.8 kg  Intake/Output Summary (Last 24 hours) at 04/05/2023 1041 Last data filed at 04/05/2023 0800 Gross per 24 hour  Intake 550 ml  Output 20 ml  Net 530 ml       Labs: RENAL PANEL Recent Labs  Lab 03/29/23 2120 03/30/23 0251 03/31/23 0350 04/02/23 0304 04/03/23 1547  NA 138 135 137 134*  --   K 3.6 3.5 3.8 3.5  --   CL 98 97* 98 96*  --   CO2 24 26 23 24   --   GLUCOSE 194* 216* 108* 74  --   BUN 39* 40* 44* 32*  --   CREATININE 9.38* 9.65* 11.01* 7.77*  --   CALCIUM 9.2 8.8* 8.5* 7.7*  --   MG 2.2 2.1 2.2  --   --   PHOS  --  6.5*  --   --  4.9*    Liver Function Tests: No results for input(s):  "AST", "ALT", "ALKPHOS", "BILITOT", "PROT", "ALBUMIN" in the last 168 hours. No results for input(s): "LIPASE", "AMYLASE" in the last 168 hours. No results for input(s): "AMMONIA" in the last 168 hours. CBC: Recent Labs    09/11/22 0616 09/12/22 0444 03/29/23 0847 03/30/23 0251 03/31/23 0350 04/01/23 0223 04/02/23 0304  HGB 8.4*   < > 9.8* 9.1* 9.9* 10.0* 10.3*  MCV 105.6*   < > 100.9* 98.3 99.7 97.6 97.4  VITAMINB12 848  --   --   --   --   --   --   FOLATE 25.6  --   --   --   --   --   --   FERRITIN 1,126*  --   --   --   --   --   --   TIBC 200*  --   --   --   --   --   --  IRON 54  --   --   --   --   --   --   RETICCTPCT 3.8*  --   --   --   --   --   --    < > = values in this interval not displayed.    Cardiac Enzymes: No results for input(s): "CKTOTAL", "CKMB", "CKMBINDEX", "TROPONINI" in the last 168 hours. CBG: Recent Labs  Lab 03/30/23 1914 03/31/23 0029 03/31/23 0401 04/02/23 2033 04/04/23 0801  GLUCAP 157* 119* 102* 103* 71    Iron Studies: No results for input(s): "IRON", "TIBC", "TRANSFERRIN", "FERRITIN" in the last 72 hours. Studies/Results: No results found.  Medications: Infusions:  sodium chloride     anticoagulant sodium citrate      Scheduled Medications:  amiodarone  200 mg Oral Daily   amoxicillin-clavulanate  1 tablet Oral Q12H   atorvastatin  40 mg Oral QHS   Chlorhexidine Gluconate Cloth  6 each Topical Daily   Chlorhexidine Gluconate Cloth  6 each Topical Q0600   cinacalcet  30 mg Oral Daily   feeding supplement  237 mL Oral BID BM   heparin  5,000 Units Subcutaneous Q8H   leptospermum manuka honey  1 Application Topical Daily   midodrine  20 mg Oral Q8H   pantoprazole  40 mg Oral BID AC   pregabalin  75 mg Oral BID   sevelamer carbonate  800 mg Oral BID    have reviewed scheduled and prn medications.  Physical Exam: General:NAD, comfortable Heart:RRR, s1s2 nl Lungs:clear b/l, no crackle Abdomen:soft, Non-tender,  non-distended Extremities:No edema Dialysis Access: Right IJ TDC in place.  Latoya Maulding Prasad Leemon Ayala 04/05/2023,10:41 AM  LOS: 7 days

## 2023-04-05 NOTE — TOC Progression Note (Signed)
Transition of Care [facility]) - Progression Note    Patient Details  Name: Justin [name] MRN: [id number] Date of Birth: 08/11/1945  Transition of Care [facility]) CM/SW Contact  Tom-Johnson, Hershal Coria, RN Phone Number: 04/05/2023, 3:33 PM  Clinical Narrative:     Patient's IV abx changed to Oral Augmentin, on room air. Nephrology following for inpatient dialysis.   Patient not Medically ready for discharge.  CM will continue to follow as patient progresses with care towards discharge.        Expected Discharge Plan and Services                                               Social Determinants of Health (SDOH) Interventions SDOH Screenings   Food Insecurity: No Food Insecurity (03/29/2023)  Housing: Low Risk  (03/29/2023)  Transportation Needs: No Transportation Needs (03/29/2023)  Utilities: Not At Risk (03/29/2023)  Social Connections: Unknown (10/13/2022)   Received from [facility]  Stress: No Stress Concern Present (10/14/2022)   Received from [facility]  Tobacco Use: Medium Risk (03/29/2023)    Readmission Risk Interventions    03/30/2023    2:29 PM 11/14/2022   10:08 AM 09/18/2022    4:10 PM  Readmission Risk Prevention Plan  Transportation Screening Complete Complete Complete  Medication Review (RN Care Manager) Referral to Pharmacy Complete Complete  PCP or Specialist appointment within 3-5 days of discharge Complete Complete Complete  HRI or Home Care Consult Complete Complete Complete  SW Recovery Care/Counseling Consult Complete Complete   Palliative Care Screening Not Applicable Not Applicable Complete  Skilled Nursing Facility Not Applicable Complete Not Applicable

## 2023-04-05 NOTE — Plan of Care (Signed)
  Problem: Respiratory: Goal: Ability to maintain a clear airway and adequate ventilation will improve Outcome: Progressing   Problem: Education: Goal: Knowledge of General Education information will improve Description: Including pain rating scale, medication(s)/side effects and non-pharmacologic comfort measures Outcome: Progressing   Problem: Clinical Measurements: Goal: Ability to maintain clinical measurements within normal limits will improve Outcome: Progressing Goal: Diagnostic test results will improve Outcome: Progressing Goal: Cardiovascular complication will be avoided Outcome: Progressing   Problem: Nutrition: Goal: Adequate nutrition will be maintained Outcome: Progressing   Problem: Coping: Goal: Level of anxiety will decrease Outcome: Progressing   Problem: Elimination: Goal: Will not experience complications related to bowel motility Outcome: Progressing   Problem: Pain Managment: Goal: General experience of comfort will improve Outcome: Progressing

## 2023-04-05 NOTE — Progress Notes (Signed)
PROGRESS NOTE  LOGEN KYNE  ZOX:[id number] DOB: Jun 11, 1946 DOA: 03/29/2023 PCP: [name], DO   Brief Narrative: Patient is a 77 year old male with history of ESRD on dialysis, GI bleed, rectal abscess, cholecystitis with percutaneous drain, DM, A-fib on Eliquis who presented from hemodialysis unit for the evaluation of hypotension.  He required pressors in the emergency department.  Admitted under PCCM service.  His BP runs chronically low and administered midodrine.  Also has chronic diarrhea.  Started on IV fluid and Levophed in the emergency department.  Also found to have possible abscess in the left ischial area, about 3.5 cm.  Underwent CT guided aspiration of small left ischial abscess by IR on 9/17.  After hemodynamically stability, pressures was discontinued, patient transferred to our service on 9/20.  Hospital course  remarkable for persistent low blood pressure.  Blood pressure now  better after giving fluid bolus. We are checking if he can tolerate dialysis before dc.  Assessment & Plan:  Principal Problem:   Hypotension  Acute on chronic hypotension:  Patient has history of chronic hypotension.  Also suspected to have septic shock secondary to abscess.  Also has chronic diarrhea and was having lower oral intake.  Pressures and fluid started on ED.  Currently off pressors.  Continue midodrine.  Droxidopa discontinued because this medicine is very expensive.  Patient is afebrile, sepsis physiology has resolved.  Patient is asymptomatic.  He denies any lightheadedness or dizziness.  Given  IV fluid boluses by nephrology.  Blood pressure has remained  better today with systolic in the range of 100s.  Left ischial abscess: About 3 cm.  Status post CT-guided drainage.  Wound culture did not show any organism.  Abx changed to  Augmentin when ready.  Blood cultures have been negative so far  History of cholecystectomy: Has right upper quadrant percutaneous cholecystostomy tube. He gets  regular exchange  of this tube at [facility].  He follows with [facility].  He has also been referred to general surgery for the consideration of cholecystectomy.  ESRD/hyperkalemia: Nephrology following for dialysis.  Paroxysmal A-fib: On amiodarone.  Not on anticoagulant due to history of recent GI bleed  Type 2 diabetes: Currently on sliding scale.  Monitor blood sugar  Chronic anemia: Currently hemoglobin stable.  Most likely asscoaited with  history with ESRD.  Deconditioning/debility: Patient wheelchair-bound.  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  Lives with niece        Pressure Injury 12/14/22 Heel Left;Posterior Unstageable - Full thickness tissue loss in which the base of the injury is covered by slough (yellow, tan, gray, green or brown) and/or eschar (tan, brown or black) in the wound bed. (Active)  12/14/22 0700  Location: Heel  Location Orientation: Left;Posterior  Staging: Unstageable - Full thickness tissue loss in which the base of the injury is covered by slough (yellow, tan, gray, green or brown) and/or eschar (tan, brown or black) in the wound bed.  Wound Description (Comments):   Present on Admission: Yes  Dressing Type Foam - Lift dressing to assess site every shift 04/05/23 1200     Pressure Injury 12/14/22 Heel Right Unstageable - Full thickness tissue loss in which the base of the injury is covered by slough (yellow, tan, gray, green or brown) and/or eschar (tan, brown or black) in the wound bed. (Active)  12/14/22 0700  Location: Heel  Location Orientation: Right  Staging: Unstageable - Full thickness tissue loss in which the base of the injury is  covered by slough (yellow, tan, gray, green or brown) and/or eschar (tan, brown or black) in the wound bed.  Wound Description (Comments):   Present on Admission: Yes  Dressing Type Foam - Lift dressing to assess site every shift 04/04/23 2000    DVT prophylaxis:heparin injection 5,000 Units  Start: 03/31/23 0600 SCDs Start: 03/29/23 1754     Code Status: Do not attempt resuscitation (DNR) PRE-ARREST INTERVENTIONS DESIRED  Family Communication: None at bedside  Patient status:Inpatient  Patient is from :Home  Anticipated discharge VV:OHYW  Estimated DC date: whenever he tolerates dialysis   Consultants: PCCM, nephrology  Procedures: CT-guided abscess drainage  Antimicrobials:  Anti-infectives (From admission, onward)    Start     Dose/Rate Route Frequency Ordered Stop   04/05/23 1000  amoxicillin-clavulanate (AUGMENTIN) 500-125 MG per tablet 1 tablet        1 tablet Oral Every 12 hours 04/05/23 0829 04/06/23 0959   03/30/23 1400  piperacillin-tazobactam (ZOSYN) IVPB 2.25 g  Status:  Discontinued        2.25 g 100 mL/hr over 30 Minutes Intravenous Every 8 hours 03/30/23 0801 04/05/23 0755   03/30/23 0900  vancomycin (VANCOREADY) IVPB 1750 mg/350 mL  Status:  Discontinued        1,750 mg 175 mL/hr over 120 Minutes Intravenous  Once 03/30/23 0807 03/30/23 0842   03/29/23 1600  ceFEPIme (MAXIPIME) 2 g in sodium chloride 0.9 % 100 mL IVPB        2 g 200 mL/hr over 30 Minutes Intravenous  Once 03/29/23 1554 03/29/23 1736   03/29/23 1600  vancomycin (VANCOREADY) IVPB 1750 mg/350 mL  Status:  Discontinued        1,750 mg 175 mL/hr over 120 Minutes Intravenous  Once 03/29/23 1554 03/30/23 7371       Subjective: Patient seen and examined at bedside today.  Hemodynamically stable.  Comfortable.  Lying in bed.  Blood pressure stable in the range of 100 systolic today.  No lightheadedness or dizziness.  Plan for dialysis today  objective: Vitals:   04/05/23 1147 04/05/23 1200 04/05/23 1354 04/05/23 1400  BP:   95/68 (!) 100/55  Pulse: (!) 114 (!) 103 (!) 137 (!) 116  Resp: 15 15 16 13   Temp: (!) 97.5 F (36.4 C)     TempSrc: Oral     SpO2: 95% 90% 99% 90%  Weight:      Height:        Intake/Output Summary (Last 24 hours) at 04/05/2023 1453 Last data filed at  04/05/2023 1400 Gross per 24 hour  Intake 740 ml  Output 20 ml  Net 720 ml   Filed Weights   04/03/23 0500 04/04/23 0500 04/05/23 0500  Weight: 81.6 kg 81.3 kg 82.1 kg    Examination:  General exam: Overall comfortable, not in distress,lying on bed HEENT: PERRL Respiratory system:  no wheezes or crackles  Cardiovascular system: S1 & S2 heard, RRR.  Dialysis catheter on the right chest Gastrointestinal system: Abdomen is nondistended, soft and nontender.  Right upper quadrant drain Central nervous system: Alert and oriented, bilateral lower extremity weakness Extremities: No edema, no clubbing ,no cyanosis Skin: No rashes, no ulcers,no icterus       Data Reviewed: I have personally reviewed following labs and imaging studies  CBC: Recent Labs  Lab 03/30/23 0251 03/31/23 0350 04/01/23 0223 04/02/23 0304  WBC 8.5 9.9 7.9 6.7  HGB 9.1* 9.9* 10.0* 10.3*  HCT 29.7* 31.5* 31.9* 33.3*  MCV 98.3 99.7  97.6 97.4  PLT 186 187 192 180   Basic Metabolic Panel: Recent Labs  Lab 03/29/23 2120 03/30/23 0251 03/31/23 0350 04/02/23 0304 04/03/23 1547  NA 138 135 137 134*  --   K 3.6 3.5 3.8 3.5  --   CL 98 97* 98 96*  --   CO2 24 26 23 24   --   GLUCOSE 194* 216* 108* 74  --   BUN 39* 40* 44* 32*  --   CREATININE 9.38* 9.65* 11.01* 7.77*  --   CALCIUM 9.2 8.8* 8.5* 7.7*  --   MG 2.2 2.1 2.2  --   --   PHOS  --  6.5*  --   --  4.9*     Recent Results (from the past 240 hour(s))  Blood Culture (routine x 2)     Status: None   Collection Time: 03/29/23  8:47 AM   Specimen: BLOOD RIGHT ARM  Result Value Ref Range Status   Specimen Description BLOOD RIGHT ARM  Final   Special Requests   Final    BOTTLES DRAWN AEROBIC AND ANAEROBIC Blood Culture adequate volume   Culture   Final    NO GROWTH 5 DAYS Performed at [facility], [address], [place], [place] [address]    Report Status 04/03/2023 FINAL  Final  MRSA Next Gen by PCR, Nasal     Status: None   Collection  Time: 03/29/23  7:25 PM   Specimen: Nasal Mucosa; Nasal Swab  Result Value Ref Range Status   MRSA by PCR Next Gen NOT DETECTED NOT DETECTED Final    Comment: (NOTE) The GeneXpert MRSA Assay (FDA approved for NASAL specimens only), is one component of a comprehensive MRSA colonization surveillance program. It is not intended to diagnose MRSA infection nor to guide or monitor treatment for MRSA infections. Test performance is not FDA approved in patients less than 21 years old. Performed at [facility], [address], [place], [place] [address]   Blood Culture (routine x 2)     Status: None   Collection Time: 03/29/23  9:20 PM   Specimen: BLOOD RIGHT HAND  Result Value Ref Range Status   Specimen Description BLOOD RIGHT HAND  Final   Special Requests   Final    BOTTLES DRAWN AEROBIC AND ANAEROBIC Blood Culture adequate volume   Culture   Final    NO GROWTH 5 DAYS Performed at [facility], [address] [place], [place] [address]    Report Status 04/03/2023 FINAL  Final  Aerobic/Anaerobic Culture w Gram Stain (surgical/deep wound)     Status: None   Collection Time: 03/30/23  8:19 AM   Specimen: Abscess  Result Value Ref Range Status   Specimen Description ABSCESS  Final   Special Requests LEFT ISCHIAL  Final   Gram Stain   Final    RARE WBC PRESENT, PREDOMINANTLY PMN NO ORGANISMS SEEN    Culture   Final    No growth aerobically or anaerobically. Performed at [facility], [address], [place], [place] [address]    Report Status 04/04/2023 FINAL  Final     Radiology Studies: No results found.  Scheduled Meds:  amiodarone  200 mg Oral Daily   amoxicillin-clavulanate  1 tablet Oral Q12H   atorvastatin  40 mg Oral QHS   Chlorhexidine Gluconate Cloth  6 each Topical Daily   Chlorhexidine Gluconate Cloth  6 each Topical Q0600   cinacalcet  30 mg Oral Daily   darbepoetin (ARANESP) injection - DIALYSIS  60 mcg Subcutaneous Q Mon-1800   feeding  supplement  237 mL Oral BID BM   heparin  5,000 Units Subcutaneous Q8H   leptospermum manuka honey  1 Application Topical Daily   midodrine  20 mg Oral Q8H   pantoprazole  40 mg Oral BID AC   pregabalin  75 mg Oral BID   sevelamer carbonate  800 mg Oral BID   Continuous Infusions:  sodium chloride     anticoagulant sodium citrate       LOS: 7 days   Burnadette Pop, MD [facility] P9/23/2024, 2:53 PM

## 2023-04-05 NOTE — Progress Notes (Signed)
Patient transported to HD via monitor and tolerated well

## 2023-04-06 DIAGNOSIS — I959 Hypotension, unspecified: Secondary | ICD-10-CM | POA: Diagnosis not present

## 2023-04-06 MED ORDER — DIGOXIN 0.25 MG/ML IJ SOLN
0.2500 mg | Freq: Once | INTRAMUSCULAR | Status: AC
  Administered 2023-04-06: 0.25 mg via INTRAVENOUS
  Filled 2023-04-06: qty 1

## 2023-04-06 MED ORDER — DIGOXIN 125 MCG PO TABS
0.0625 mg | ORAL_TABLET | Freq: Every day | ORAL | Status: DC
  Administered 2023-04-07 – 2023-04-08 (×2): 0.0625 mg via ORAL
  Filled 2023-04-06 (×2): qty 1

## 2023-04-06 NOTE — Progress Notes (Signed)
[facility] NEPHROLOGY PROGRESS NOTE  Assessment/ Plan: Pt is a 77 y.o. yo male   OP HD: [facility] M[facility] 3h  450/ 1.5  77.5kg  2/2.5 bath   R TDC   Heparin none - last OP HD 9/13, post wt 80kg - lowest in-session BP's have been SBP 40s- 80s last 2 wks, maybe chronic issue - coming off 3-4kg over dry wt the last 2 wks - mircera 75 q 2 wks, last 9/09, due 9/23  # Shock - acute on chronic hypotension. Ischial abscess drained by IR 9/18.  Antibiotics per primary team.  Currently on midodrine 20 mg 3 times daily.  # ESRD/volume- MWF.  Status post dialysis yesterday with no UF.  Problem with intradialytic hypotension.  We will do regular HD tomorrow.  #Chronic hypotension - cont midodrine 20 tid here as at home. Droxidopa cancelled due to high outpatient costs.  We will try to lower dialysate temperature to mitigate IDH.  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 Continue ESA.    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 Continuing binder and sensipar.   # A-fib with RVR: Tachycardia this morning.  On amiodarone.  No anticoagulation because of GI bleed.  May consider cardiology consult/evaluation.  Discussed the primary team.  Subjective: Seen and examined ICU.  Intradialytic hypotension with HD, no UF.  Noted tachycardic this morning.  Denies nausea, vomiting, chest pain or shortness of breath. Objective Vital signs in last 24 hours: Vitals:   04/06/23 0830 04/06/23 0900 04/06/23 0930 04/06/23 1000  BP: 98/62 (!) 85/63 (!) 81/57 (!) 76/56  Pulse: (!) 117 (!) 110 (!) 118 (!) 104  Resp: 19 19 11 15   Temp:      TempSrc:      SpO2: 99% 99% 96% 96%  Weight:      Height:       Weight change: 1.3 kg  Intake/Output Summary (Last 24 hours) at 04/06/2023 1053 Last data filed at 04/06/2023 0800 Gross per 24 hour  Intake 490 ml  Output 0 ml  Net 490 ml       Labs: RENAL PANEL Recent Labs  Lab 03/31/23 0350 04/02/23 0304 04/03/23 1547 04/05/23 1646  NA 137 134*  --  138  K  3.8 3.5  --  3.6  CL 98 96*  --  103  CO2 23 24  --  23  GLUCOSE 108* 74  --  140*  BUN 44* 32*  --  46*  CREATININE 11.01* 7.77*  --  8.73*  CALCIUM 8.5* 7.7*  --  7.7*  MG 2.2  --   --   --   PHOS  --   --  4.9* 4.4  ALBUMIN  --   --   --  2.1*    Liver Function Tests: Recent Labs  Lab 04/05/23 1646  ALBUMIN 2.1*   No results for input(s): "LIPASE", "AMYLASE" in the last 168 hours. No results for input(s): "AMMONIA" in the last 168 hours. CBC: Recent Labs    09/11/22 0616 09/12/22 0444 03/30/23 0251 03/31/23 0350 04/01/23 0223 04/02/23 0304 04/05/23 1646  HGB 8.4*   < > 9.1* 9.9* 10.0* 10.3* 9.8*  MCV 105.6*   < > 98.3 99.7 97.6 97.4 100.9*  VITAMINB12 848  --   --   --   --   --   --   FOLATE 25.6  --   --   --   --   --   --  FERRITIN 1,126*  --   --   --   --   --   --   TIBC 200*  --   --   --   --   --   --   IRON 54  --   --   --   --   --   --   RETICCTPCT 3.8*  --   --   --   --   --   --    < > = values in this interval not displayed.    Cardiac Enzymes: No results for input(s): "CKTOTAL", "CKMB", "CKMBINDEX", "TROPONINI" in the last 168 hours. CBG: Recent Labs  Lab 03/30/23 1914 03/31/23 0029 03/31/23 0401 04/02/23 2033 04/04/23 0801  GLUCAP 157* 119* 102* 103* 71    Iron Studies: No results for input(s): "IRON", "TIBC", "TRANSFERRIN", "FERRITIN" in the last 72 hours. Studies/Results: No results found.  Medications: Infusions:  sodium chloride     anticoagulant sodium citrate      Scheduled Medications:  amiodarone  200 mg Oral Daily   atorvastatin  40 mg Oral QHS   Chlorhexidine Gluconate Cloth  6 each Topical Daily   Chlorhexidine Gluconate Cloth  6 each Topical Q0600   cinacalcet  30 mg Oral Daily   darbepoetin (ARANESP) injection - DIALYSIS  60 mcg Subcutaneous Q Mon-1800   feeding supplement  237 mL Oral BID BM   heparin  5,000 Units Subcutaneous Q8H   leptospermum manuka honey  1 Application Topical Daily   midodrine  20 mg  Oral Q8H   pantoprazole  40 mg Oral BID AC   pregabalin  75 mg Oral BID   sevelamer carbonate  800 mg Oral BID    have reviewed scheduled and prn medications.  Physical Exam: General:NAD, comfortable Heart: Tachycardic, s1s2 nl Lungs:clear b/l, no crackle Abdomen:soft, Non-tender, non-distended Extremities:No edema Dialysis Access: Right IJ TDC in place.  Alenna Russell Prasad Danaiya Steadman 04/06/2023,10:53 AM  LOS: 8 days

## 2023-04-06 NOTE — Consult Note (Signed)
Reason for Consult:A. Fib with RVR/hypotension Referring Physician: [facility]  LAWARNCE CHUGH is an 77 y.o. male.  HPI: Patient is 77 year old male with past medical history significant for multiple medical problems i.e. hypertension, hyperlipidemia, type 2 diabetes mellitus, diabetic retinopathy and vitreous hemorrhage, diabetic neuropathy, end-stage renal disease on hemodialysis, anemia of chronic disease, history of GI bleed in the past, history of perirectal abscess/septic shock in the past, cholecystitis with chronic percutaneous drain, history of chronic hypotension during hemodialysis was transferred to [facility] because of hypotension and was noted to have sepsis and was admitted to ICU received vasopressors and broad-spectrum antibiotics.  Cardiology consultation is called as patient has remained in A-fib with RVR with blood pressure staying in 80s to low 90s.  Patient denies any dizziness lightheadedness denies any chest pain nausea vomiting denies PND orthopnea leg swelling states has been bedbound for last 9 months after his surgery.  Patient is eager to go home.  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) 12/07/2019   Dialysis patient [facility])    ESRD (end stage renal disease) on dialysis (HCC)    Gout    Hypertension    Hypotension 06/13/2022   Right posterior capsular opacification 02/27/2021   Septic shock (HCC)    Vitreous hemorrhage of right eye (HCC) 10/24/2019    Past Surgical History:  Procedure Laterality Date   A/V FISTULAGRAM Left 08/17/2017   Procedure: A/V FISTULAGRAM;  Surgeon: Renford Dills, MD;   Location: [facility] INVASIVE CV LAB;  Service: Cardiovascular;  Laterality: Left;   A/V SHUNT INTERVENTION N/A 08/17/2017   Procedure: A/V SHUNT INTERVENTION;  Surgeon: Renford Dills, MD;  Location: [facility] INVASIVE CV LAB;  Service: Cardiovascular;  Laterality: N/A;   AV FISTULA PLACEMENT Left 05/31/2015   Procedure: ARTERIOVENOUS (AV) FISTULA CREATION;  Surgeon: Renford Dills, MD;  Location: [facility] ORS;  Service: Vascular;  Laterality: Left;   COLONOSCOPY N/A 12/17/2022   Procedure: COLONOSCOPY;  Surgeon: Napoleon Form, MD;  Location: [facility] ENDOSCOPY;  Service: Gastroenterology;  Laterality: N/A;   COLONOSCOPY WITH PROPOFOL N/A 09/11/2022   Procedure: COLONOSCOPY WITH PROPOFOL;  Surgeon: Imogene Burn, MD;  Location: [facility] ENDOSCOPY;  Service: Gastroenterology;  Laterality: N/A;   COLONOSCOPY WITH PROPOFOL N/A 09/17/2022   Procedure: COLONOSCOPY WITH PROPOFOL;  Surgeon: Benancio Deeds, MD;  Location: [facility] ENDOSCOPY;  Service: Gastroenterology;  Laterality: N/A;   ESOPHAGOGASTRODUODENOSCOPY (EGD) WITH PROPOFOL N/A 09/11/2022   Procedure: ESOPHAGOGASTRODUODENOSCOPY (EGD) WITH PROPOFOL;  Surgeon: Imogene Burn, MD;  Location: [facility] ENDOSCOPY;  Service: Gastroenterology;  Laterality: N/A;   ESOPHAGOGASTRODUODENOSCOPY (EGD) WITH PROPOFOL N/A 12/17/2022   Procedure: ESOPHAGOGASTRODUODENOSCOPY (EGD) WITH PROPOFOL;  Surgeon: Napoleon Form, MD;  Location: [facility] ENDOSCOPY;  Service: Gastroenterology;  Laterality: N/A;   excision bone spurs Bilateral 1989   feet   GIVENS CAPSULE STUDY N/A 11/10/2022   Procedure: GIVENS CAPSULE STUDY;  Surgeon: Shellia Cleverly, DO;  Location: [facility] ENDOSCOPY;  Service: Gastroenterology;  Laterality: N/A;   HEMOSTASIS CLIP PLACEMENT  09/11/2022   Procedure: HEMOSTASIS CLIP PLACEMENT;  Surgeon: Imogene Burn, MD;  Location: [facility] ENDOSCOPY;  Service: Gastroenterology;;   HEMOSTASIS  CLIP PLACEMENT  09/17/2022   Procedure: HEMOSTASIS CLIP PLACEMENT;  Surgeon: Benancio Deeds, MD;   Location: [facility] ENDOSCOPY;  Service: Gastroenterology;;   HEMOSTASIS CLIP PLACEMENT  12/17/2022   Procedure: HEMOSTASIS CLIP PLACEMENT;  Surgeon: Napoleon Form, MD;  Location: [facility] ENDOSCOPY;  Service: Gastroenterology;;   HOT HEMOSTASIS N/A 09/11/2022   Procedure: HOT HEMOSTASIS (ARGON PLASMA COAGULATION/BICAP);  Surgeon: Imogene Burn, MD;  Location: [facility] ENDOSCOPY;  Service: Gastroenterology;  Laterality: N/A;   HOT HEMOSTASIS N/A 09/17/2022   Procedure: HOT HEMOSTASIS (ARGON PLASMA COAGULATION/BICAP);  Surgeon: Benancio Deeds, MD;  Location: [facility] ENDOSCOPY;  Service: Gastroenterology;  Laterality: N/A;   INCISION AND DRAINAGE PERIRECTAL ABSCESS N/A 06/15/2022   Procedure: IRRIGATION AND DEBRIDEMENT PERIRECTAL ABSCESS;  Surgeon: Franky Macho, MD;  Location: [facility] ORS;  Service: General;  Laterality: N/A;   IR EXCHANGE BILIARY DRAIN  03/16/2023   KNEE SURGERY Left 1998   arthroscopy   POLYPECTOMY  09/11/2022   Procedure: POLYPECTOMY;  Surgeon: Imogene Burn, MD;  Location: [facility] ENDOSCOPY;  Service: Gastroenterology;;   SHOULDER SURGERY Left 1994   rotator cuff    Family History  Problem Relation Age of Onset   Kidney disease Mother    Alcoholism Father    Alcoholism Brother    Colon cancer Neg Hx    Stomach cancer Neg Hx    Esophageal cancer Neg Hx     Social History:  reports that he quit smoking about 44 years ago. His smoking use included cigarettes. He has never used smokeless tobacco. He reports that he does not drink alcohol and does not use drugs.  Allergies: No Known Allergies  Medications: I have reviewed the patient's current medications.  Results for orders placed or performed during the hospital encounter of 03/29/23 (from the past 48 hour(s))  CBC     Status: Abnormal   Collection Time: 04/05/23  4:46 PM  Result Value Ref Range   WBC 8.9 4.0 - 10.5 K/uL    Comment: WHITE COUNT CONFIRMED ON SMEAR   RBC 3.16 (L) 4.22 - 5.81 MIL/uL   Hemoglobin 9.8 (L) 13.0 - 17.0 g/dL    HCT 81.1 (L) 91.4 - 52.0 %   MCV 100.9 (H) 80.0 - 100.0 fL   MCH 31.0 26.0 - 34.0 pg   MCHC 30.7 30.0 - 36.0 g/dL   RDW 78.2 (H) 95.6 - 21.3 %   Platelets 186 150 - 400 K/uL   nRBC 0.0 0.0 - 0.2 %    Comment: Performed at [facility], [address], [place], [place] [address]  Renal function panel     Status: Abnormal   Collection Time: 04/05/23  4:46 PM  Result Value Ref Range   Sodium 138 135 - 145 mmol/L   Potassium 3.6 3.5 - 5.1 mmol/L   Chloride 103 98 - 111 mmol/L   CO2 23 22 - 32 mmol/L   Glucose, Bld 140 (H) 70 - 99 mg/dL    Comment: Glucose reference range applies only to samples taken after fasting for at least 8 hours.   BUN 46 (H) 8 - 23 mg/dL   Creatinine, Ser 8.46 (H) 0.61 - 1.24 mg/dL   Calcium 7.7 (L) 8.9 - 10.3 mg/dL   Phosphorus 4.4 2.5 - 4.6 mg/dL   Albumin 2.1 (L) 3.5 - 5.0 g/dL   GFR, Estimated 6 (L) >60 mL/min    Comment: (NOTE) Calculated using the CKD-EPI Creatinine Equation (2021)    Anion gap 12 5 -  15    Comment: Performed at  [facility], [address], [place], [place] [address]    No results found.  Review of Systems  Constitutional:  Positive for chills. Negative for diaphoresis.  HENT:  Negative for sore throat.   Eyes:  Negative for visual disturbance.  Respiratory:  Negative for chest tightness and shortness of breath.   Cardiovascular:  Negative for chest pain, palpitations and leg swelling.  Neurological:  Negative for dizziness and seizures.   Blood pressure (!) 88/50, pulse 96, temperature 98.3 F (36.8 C), temperature source Oral, resp. rate 15, height 5\' 10"  (1.778 m), weight 83.4 kg, SpO2 94%. Physical Exam Constitutional:      Appearance: Normal appearance.  HENT:     Head: Normocephalic and atraumatic.  Eyes:     Extraocular Movements: Extraocular movements intact.     Pupils: Pupils are equal, round, and reactive to light.  Cardiovascular:     Rate and Rhythm: Rhythm irregular.     Heart sounds: Murmur (2/6  systolic murmur noted) heard.  Pulmonary:     Effort: Pulmonary effort is normal.     Breath sounds: Normal breath sounds.  Abdominal:     General: Abdomen is flat. Bowel sounds are normal.     Palpations: Abdomen is soft.     Comments: Gallbladder percutaneous drain noted  Musculoskeletal:     Cervical back: Neck supple.     Comments: No clubbing cyanosis or edema noted  Skin:    General: Skin is warm and dry.  Neurological:     General: No focal deficit present.     Mental Status: He is alert and oriented to person, place, and time.     Assessment/Plan: Chronic atrial fibrillation with rapid ventricular response CHA2DS2-VASc score of 6 Chronic hypotension Moderate aortic stenosis History of HFpEF Diabetes mellitus Diabetic retinopathy with history of vitreous hemorrhage Diabetic neuropathy End-stage renal disease on hemodialysis Anemia of chronic disease History of perirectal abscess/septic shock Cholecystitis status post percutaneous drain in the past Plan Consider restarting Eliquis if okay with GI Start digoxin low-dose as per orders Consider adding midodrine if remains hypotensive  [name] 04/06/2023, 12:50 PM

## 2023-04-06 NOTE — Progress Notes (Addendum)
PROGRESS NOTE  Justin [name]  NWG:[id number] DOB: 04/08/1946 DOA: 03/29/2023 PCP: [name], DO   Brief Narrative: Patient is a 77 year old male with history of ESRD on dialysis, GI bleed, rectal abscess, cholecystitis with percutaneous drain, DM, A-fib on Eliquis who presented from hemodialysis unit for the evaluation of hypotension.  He required pressors in the emergency department.  Admitted under PCCM service.  His BP runs chronically low and administered midodrine.  Also has chronic diarrhea.  Started on IV fluid and Levophed in the emergency department.  Also found to have possible abscess in the left ischial area, about 3.5 cm.  Underwent CT guided aspiration of small left ischial abscess by IR on 9/17.  After hemodynamically stability, pressures was discontinued, patient transferred to our service on 9/20.  Hospital course  remarkable for persistent low blood pressure.  Blood pressure now  better after giving fluid bolus.  Tolerated dialysis on 9/23 evening.  Went into rapid A-fib this morning  Assessment & Plan:  Principal Problem:   Hypotension  Acute on chronic hypotension:  Patient has history of chronic hypotension.  Also suspected to have septic shock secondary to abscess.  Also has chronic diarrhea and was having lower oral intake.  Pressures and fluid started on ED.  Currently off pressors.  Continue midodrine.  Droxidopa discontinued because this medicine is very expensive.  Patient is afebrile, sepsis physiology has resolved.  Patient is asymptomatic.  He denies any lightheadedness or dizziness.  Given  IV fluid boluses by nephrology.  Blood pressure improved and he tolerated dialysis on 9/23  Paroxysmal A-fib: On oral amiodarone.  Not on anticoagulant due to history of recent GI bleed.  Cannot rate control agents.  Went into rapid A-fib with heart rate in the range of 100-120 this morning.  Asymptomatic.  Cardiology consulted  Left ischial abscess: About 3 cm.  Status post  CT-guided drainage.  Wound culture did not show any organism.  He completed antibiotics course.  BLood cultures have been negative so far  History of cholecystectomy: Has right upper quadrant percutaneous cholecystostomy tube. He gets regular exchange  of this tube at [facility].  He follows with [facility].  He has also been referred to general surgery for the consideration of cholecystectomy.  ESRD/hyperkalemia: Nephrology following for dialysis.  Type 2 diabetes: Currently on sliding scale.  Monitor blood sugar  Chronic anemia: Currently hemoglobin stable.  Most likely asscoaited with  history with ESRD.  Deconditioning/debility: Patient wheelchair-bound.  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  Lives with niece        Pressure Injury 12/14/22 Heel Left;Posterior Unstageable - Full thickness tissue loss in which the base of the injury is covered by slough (yellow, tan, gray, green or brown) and/or eschar (tan, brown or black) in the wound bed. (Active)  12/14/22 0700  Location: Heel  Location Orientation: Left;Posterior  Staging: Unstageable - Full thickness tissue loss in which the base of the injury is covered by slough (yellow, tan, gray, green or brown) and/or eschar (tan, brown or black) in the wound bed.  Wound Description (Comments):   Present on Admission: Yes  Dressing Type Foam - Lift dressing to assess site every shift 04/06/23 0800     Pressure Injury 12/14/22 Heel Right Unstageable - Full thickness tissue loss in which the base of the injury is covered by slough (yellow, tan, gray, green or brown) and/or eschar (tan, brown or black) in the wound bed. (Active)  12/14/22 0700  Location:  Heel  Location Orientation: Right  Staging: Unstageable - Full thickness tissue loss in which the base of the injury is covered by slough (yellow, tan, gray, green or brown) and/or eschar (tan, brown or black) in the wound bed.  Wound Description (Comments):   Present on  Admission: Yes  Dressing Type Foam - Lift dressing to assess site every shift 04/06/23 0800    DVT prophylaxis:heparin injection 5,000 Units Start: 03/31/23 0600 SCDs Start: 03/29/23 1754     Code Status: Do not attempt resuscitation (DNR) PRE-ARREST INTERVENTIONS DESIRED  Family Communication: None at bedside  Patient status:Inpatient  Patient is from :Home  Anticipated discharge ZO:XWRU  Estimated DC date: After stabilization of heart rate, blood pressure   Consultants: PCCM, nephrology, cardiology  Procedures: CT-guided abscess drainage, dialysis  Antimicrobials:  Anti-infectives (From admission, onward)    Start     Dose/Rate Route Frequency Ordered Stop   04/05/23 1000  amoxicillin-clavulanate (AUGMENTIN) 500-125 MG per tablet 1 tablet        1 tablet Oral Every 12 hours 04/05/23 0829 04/05/23 2329   03/30/23 1400  piperacillin-tazobactam (ZOSYN) IVPB 2.25 g  Status:  Discontinued        2.25 g 100 mL/hr over 30 Minutes Intravenous Every 8 hours 03/30/23 0801 04/05/23 0755   03/30/23 0900  vancomycin (VANCOREADY) IVPB 1750 mg/350 mL  Status:  Discontinued        1,750 mg 175 mL/hr over 120 Minutes Intravenous  Once 03/30/23 0807 03/30/23 0842   03/29/23 1600  ceFEPIme (MAXIPIME) 2 g in sodium chloride 0.9 % 100 mL IVPB        2 g 200 mL/hr over 30 Minutes Intravenous  Once 03/29/23 1554 03/29/23 1736   03/29/23 1600  vancomycin (VANCOREADY) IVPB 1750 mg/350 mL  Status:  Discontinued        1,750 mg 175 mL/hr over 120 Minutes Intravenous  Once 03/29/23 1554 03/30/23 0454       Subjective: Patient seen and examined at bedside today.  During my evaluation, he was comfortable, lying in bed.  Systolic blood pressure was in the range of 90s.  His heart rate was ranging from 100-120 today with A-fib rhythm  objective: Vitals:   04/06/23 0830 04/06/23 0900 04/06/23 0930 04/06/23 1000  BP: 98/62 (!) 85/63 (!) 81/57 (!) 76/56  Pulse: (!) 117 (!) 110 (!) 118 (!) 104   Resp: 19 19 11 15   Temp:      TempSrc:      SpO2: 99% 99% 96% 96%  Weight:      Height:        Intake/Output Summary (Last 24 hours) at 04/06/2023 1119 Last data filed at 04/06/2023 0800 Gross per 24 hour  Intake 430 ml  Output 0 ml  Net 430 ml   Filed Weights   04/05/23 0500 04/05/23 1817 04/06/23 0500  Weight: 82.1 kg 83.4 kg 83.4 kg    Examination:   General exam: Overall comfortable, not in distress, lying in bed, deconditioned HEENT: PERRL Respiratory system:  no wheezes or crackles  Cardiovascular system: Irregular regular rhythm, dialysis catheter on the right chest Gastrointestinal system: Abdomen is nondistended, soft and nontender.  Right upper quadrant drain Central nervous system: Alert and oriented Extremities: No edema, no clubbing ,no cyanosis, bilateral lower extremity weakness Skin: No rashes, no ulcers,no icterus      Data Reviewed: I have personally reviewed following labs and imaging studies  CBC: Recent Labs  Lab 03/31/23 0350 04/01/23 0223 04/02/23  0304 04/05/23 1646  WBC 9.9 7.9 6.7 8.9  HGB 9.9* 10.0* 10.3* 9.8*  HCT 31.5* 31.9* 33.3* 31.9*  MCV 99.7 97.6 97.4 100.9*  PLT 187 192 180 186   Basic Metabolic Panel: Recent Labs  Lab 03/31/23 0350 04/02/23 0304 04/03/23 1547 04/05/23 1646  NA 137 134*  --  138  K 3.8 3.5  --  3.6  CL 98 96*  --  103  CO2 23 24  --  23  GLUCOSE 108* 74  --  140*  BUN 44* 32*  --  46*  CREATININE 11.01* 7.77*  --  8.73*  CALCIUM 8.5* 7.7*  --  7.7*  MG 2.2  --   --   --   PHOS  --   --  4.9* 4.4     Recent Results (from the past 240 hour(s))  Blood Culture (routine x 2)     Status: None   Collection Time: 03/29/23  8:47 AM   Specimen: BLOOD RIGHT ARM  Result Value Ref Range Status   Specimen Description BLOOD RIGHT ARM  Final   Special Requests   Final    BOTTLES DRAWN AEROBIC AND ANAEROBIC Blood Culture adequate volume   Culture   Final    NO GROWTH 5 DAYS Performed at [facility]  Lab, [address], [place], [place] [address]    Report Status 04/03/2023 FINAL  Final  MRSA Next Gen by PCR, Nasal     Status: None   Collection Time: 03/29/23  7:25 PM   Specimen: Nasal Mucosa; Nasal Swab  Result Value Ref Range Status   MRSA by PCR Next Gen NOT DETECTED NOT DETECTED Final    Comment: (NOTE) The GeneXpert MRSA Assay (FDA approved for NASAL specimens only), is one component of a comprehensive MRSA colonization surveillance program. It is not intended to diagnose MRSA infection nor to guide or monitor treatment for MRSA infections. Test performance is not FDA approved in patients less than 40 years old. Performed at [facility], [address], [place], [place] [address]   Blood Culture (routine x 2)     Status: None   Collection Time: 03/29/23  9:20 PM   Specimen: BLOOD RIGHT HAND  Result Value Ref Range Status   Specimen Description BLOOD RIGHT HAND  Final   Special Requests   Final    BOTTLES DRAWN AEROBIC AND ANAEROBIC Blood Culture adequate volume   Culture   Final    NO GROWTH 5 DAYS Performed at [facility], [address], [place], [place] [address]    Report Status 04/03/2023 FINAL  Final  Aerobic/Anaerobic Culture w Gram Stain (surgical/deep wound)     Status: None   Collection Time: 03/30/23  8:19 AM   Specimen: Abscess  Result Value Ref Range Status   Specimen Description ABSCESS  Final   Special Requests LEFT ISCHIAL  Final   Gram Stain   Final    RARE WBC PRESENT, PREDOMINANTLY PMN NO ORGANISMS SEEN    Culture   Final    No growth aerobically or anaerobically. Performed at [facility], [address] [place], [place] [address]    Report Status 04/04/2023 FINAL  Final     Radiology Studies: No results found.  Scheduled Meds:  amiodarone  200 mg Oral Daily   atorvastatin  40 mg Oral QHS   Chlorhexidine Gluconate Cloth  6 each Topical Daily   Chlorhexidine Gluconate Cloth  6 each  Topical Q0600   cinacalcet  30 mg  Oral Daily   darbepoetin (ARANESP) injection - DIALYSIS  60 mcg Subcutaneous Q Mon-1800   feeding supplement  237 mL Oral BID BM   heparin  5,000 Units Subcutaneous Q8H   leptospermum manuka honey  1 Application Topical Daily   midodrine  20 mg Oral Q8H   pantoprazole  40 mg Oral BID AC   pregabalin  75 mg Oral BID   sevelamer carbonate  800 mg Oral BID   Continuous Infusions:  sodium chloride     anticoagulant sodium citrate       LOS: 8 days   Burnadette Pop, MD [facility] P9/24/2024, 11:19 AM

## 2023-04-06 NOTE — Progress Notes (Addendum)
   04/05/23 2208  Vitals  BP (!) 118/90  MAP (mmHg) 99  BP Location Right Arm  BP Method Automatic  Patient Position (if appropriate) Lying  Pulse Rate (!) 105  Pulse Rate Source Monitor  ECG Heart Rate (!) 105  During Treatment Monitoring  Blood Flow Rate (mL/min) 400 mL/min  Arterial Pressure (mmHg) -260 mmHg  Venous Pressure (mmHg) 90 mmHg  Ultrafiltration Rate (mL/min) 246 mL/min  Dialysate Flow Rate (mL/min) 300 ml/min  Intra-Hemodialysis Comments Tx completed  Post Treatment  Dialyzer Clearance Lightly streaked  Liters Processed 78  Fluid Removed (mL) 0 mL  Tolerated HD Treatment Yes  Post-Hemodialysis Comments pt stable  Hemodialysis Catheter Right Subclavian  No placement date or time found.   Placed prior to admission: Yes  Orientation: Right  Access Location: Subclavian  Site Condition No complications  Blue Lumen Status Heparin locked  Red Lumen Status Heparin locked  Dressing Type Transparent  Post treatment catheter status Capped and Clamped   Handoff To [employer] RN

## 2023-04-07 DIAGNOSIS — I959 Hypotension, unspecified: Secondary | ICD-10-CM | POA: Diagnosis not present

## 2023-04-07 LAB — CBC
HCT: 30.1 % — ABNORMAL LOW (ref 39.0–52.0)
Hemoglobin: 9.4 g/dL — ABNORMAL LOW (ref 13.0–17.0)
MCH: 30.5 pg (ref 26.0–34.0)
MCHC: 31.2 g/dL (ref 30.0–36.0)
MCV: 97.7 fL (ref 80.0–100.0)
Platelets: 170 10*3/uL (ref 150–400)
RBC: 3.08 MIL/uL — ABNORMAL LOW (ref 4.22–5.81)
RDW: 16.2 % — ABNORMAL HIGH (ref 11.5–15.5)
WBC: 8.3 10*3/uL (ref 4.0–10.5)
nRBC: 0 % (ref 0.0–0.2)

## 2023-04-07 LAB — RENAL FUNCTION PANEL
Albumin: 2 g/dL — ABNORMAL LOW (ref 3.5–5.0)
Anion gap: 9 (ref 5–15)
BUN: 34 mg/dL — ABNORMAL HIGH (ref 8–23)
CO2: 28 mmol/L (ref 22–32)
Calcium: 8 mg/dL — ABNORMAL LOW (ref 8.9–10.3)
Chloride: 100 mmol/L (ref 98–111)
Creatinine, Ser: 7.2 mg/dL — ABNORMAL HIGH (ref 0.61–1.24)
GFR, Estimated: 7 mL/min — ABNORMAL LOW (ref >60)
Glucose, Bld: 127 mg/dL — ABNORMAL HIGH (ref 70–99)
Phosphorus: 3.3 mg/dL (ref 2.5–4.6)
Potassium: 3.4 mmol/L — ABNORMAL LOW (ref 3.5–5.1)
Sodium: 137 mmol/L (ref 135–145)

## 2023-04-07 MED ORDER — LIDOCAINE HCL (PF) 1 % IJ SOLN: 5.0000 mL | INTRAMUSCULAR | Status: DC | PRN

## 2023-04-07 MED ORDER — LIDOCAINE-PRILOCAINE 2.5-2.5 % EX CREA: 1.0000 | TOPICAL_CREAM | CUTANEOUS | Status: DC | PRN

## 2023-04-07 MED ORDER — HEPARIN SODIUM (PORCINE) 1000 UNIT/ML DIALYSIS: 1000.0000 [IU] | INTRAMUSCULAR | Status: DC | PRN

## 2023-04-07 MED ORDER — PENTAFLUOROPROP-TETRAFLUOROETH EX AERO: 1.0000 | INHALATION_SPRAY | CUTANEOUS | Status: DC | PRN

## 2023-04-07 MED ORDER — CHLORHEXIDINE GLUCONATE CLOTH 2 % EX PADS: 6.0000 | MEDICATED_PAD | Freq: Every day | CUTANEOUS | Status: DC

## 2023-04-07 MED ORDER — ALTEPLASE 2 MG IJ SOLR: 2.0000 mg | Freq: Once | INTRAMUSCULAR | Status: DC | PRN

## 2023-04-07 MED ORDER — ANTICOAGULANT SODIUM CITRATE 4% (200MG/5ML) IV SOLN: 5.0000 mL | Status: DC | PRN

## 2023-04-07 MED ORDER — DIGOXIN 0.25 MG/ML IJ SOLN
0.2500 mg | Freq: Once | INTRAMUSCULAR | Status: AC
  Administered 2023-04-07: 0.25 mg via INTRAVENOUS
  Filled 2023-04-07: qty 1

## 2023-04-07 NOTE — Progress Notes (Signed)
Subjective:  Patient denies and chest pain or shortness of breath blood pressure and heart rate better controlled with occasional episodes of A. Fib with RVR  Objective:  Vital Signs in the last 24 hours: Temp:  [98 F (36.7 C)-98.6 F (37 C)] 98.1 F (36.7 C) (09/25 0802) Pulse Rate:  [67-122] 83 (09/25 0700) Resp:  [10-23] 15 (09/25 0700) BP: (70-133)/(45-98) 95/54 (09/25 0700) SpO2:  [90 %-99 %] 94 % (09/25 0700) FiO2 (%):  [40 %] 40 % (09/24 1800) Weight:  [83.4 kg] 83.4 kg (09/25 0500)  Intake/Output from previous day: 09/24 0701 - 09/25 0700 In: 300 [P.O.:300] Out: 210 [Drains:210] Intake/Output from this shift: Total I/O In: 236 [P.O.:236] Out: -   Physical Exam: Exam unchanged  Lab Results: Recent Labs    04/05/23 1646  WBC 8.9  HGB 9.8*  PLT 186   Recent Labs    04/05/23 1646  NA 138  K 3.6  CL 103  CO2 23  GLUCOSE 140*  BUN 46*  CREATININE 8.73*   No results for input(s): "TROPONINI" in the last 72 hours.  Invalid input(s): "CK", "MB" Hepatic Function Panel Recent Labs    04/05/23 1646  ALBUMIN 2.1*   No results for input(s): "CHOL" in the last 72 hours. No results for input(s): "PROTIME" in the last 72 hours.  Imaging: Imaging results have been reviewed and No results found.  Cardiac Studies:  Assessment/Plan:  Chronic atrial fibrillation with moderate ventricular response CHA2DS2-VASc score of 6 Chronic hypotension Moderate aortic stenosis History of HFpEF Diabetes mellitus Diabetic retinopathy with history of vitreous hemorrhage Diabetic neuropathy End-stage renal disease on hemodialysis Anemia of chronic disease History of perirectal abscess/septic shock Cholecystitis status post percutaneous drain in the past Plan Continue present management Okay to discharge if  heart rate and blood pressure stable  LOS: 9 days    [name] 04/07/2023, 8:18 AM

## 2023-04-07 NOTE — Progress Notes (Signed)
[facility] NEPHROLOGY PROGRESS NOTE  Assessment/ Plan: Pt is a 77 y.o. yo male   OP HD: [facility] M[facility] 3h  450/ 1.5  77.5kg  2/2.5 bath   R TDC   Heparin none - last OP HD 9/13, post wt 80kg - lowest in-session BP's have been SBP 40s- 80s last 2 wks, maybe chronic issue - coming off 3-4kg over dry wt the last 2 wks - mircera 75 q 2 wks, last 9/09, due 9/23  # Shock - acute on chronic hypotension. Ischial abscess drained by IR 9/18.  Antibiotics per primary team.  Currently on midodrine 20 mg 3 times daily.  # ESRD/volume- MWF.  Plan for dialysis today, no issue with volume.  #Chronic hypotension - cont midodrine 20 tid here as at home. Droxidopa cancelled due to high outpatient costs.  We have changed dialysate temperature to 36, albumin as needed to mitigate IDH. He is clinically asymptomatic with low blood pressure therefore this might be his new baseline BP.  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 Continue ESA.    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 Continuing binder and sensipar.   # A-fib with RVR: Seen by cardiology.  On amiodarone and digoxin.  On amiodarone.  No anticoagulation because of GI bleed.    Subjective: Seen and examined ICU.  Chronic hypotension despite of midodrine use.  Clinically asymptomatic.  Seen by cardiologist and added digoxin.  BP remains low.  He denies nausea, vomiting, chest pain, shortness of breath.  . Objective Vital signs in last 24 hours: Vitals:   04/07/23 0930 04/07/23 1000 04/07/23 1030 04/07/23 1100  BP: (!) 92/48 (!) 88/58 (!) 81/55 (!) 82/48  Pulse: 100 100 90 79  Resp: 18 18 16 18   Temp:      TempSrc:      SpO2: 95% 95% 92% 94%  Weight:      Height:       Weight change: 0 kg  Intake/Output Summary (Last 24 hours) at 04/07/2023 1112 Last data filed at 04/07/2023 0800 Gross per 24 hour  Intake 436 ml  Output 210 ml  Net 226 ml       Labs: RENAL PANEL Recent Labs  Lab 04/02/23 0304 04/03/23 1547 04/05/23 1646   NA 134*  --  138  K 3.5  --  3.6  CL 96*  --  103  CO2 24  --  23  GLUCOSE 74  --  140*  BUN 32*  --  46*  CREATININE 7.77*  --  8.73*  CALCIUM 7.7*  --  7.7*  PHOS  --  4.9* 4.4  ALBUMIN  --   --  2.1*    Liver Function Tests: Recent Labs  Lab 04/05/23 1646  ALBUMIN 2.1*   No results for input(s): "LIPASE", "AMYLASE" in the last 168 hours. No results for input(s): "AMMONIA" in the last 168 hours. CBC: Recent Labs    09/11/22 0616 09/12/22 0444 03/30/23 0251 03/31/23 0350 04/01/23 0223 04/02/23 0304 04/05/23 1646  HGB 8.4*   < > 9.1* 9.9* 10.0* 10.3* 9.8*  MCV 105.6*   < > 98.3 99.7 97.6 97.4 100.9*  VITAMINB12 848  --   --   --   --   --   --   FOLATE 25.6  --   --   --   --   --   --   FERRITIN 1,126*  --   --   --   --   --   --  TIBC 200*  --   --   --   --   --   --   IRON 54  --   --   --   --   --   --   RETICCTPCT 3.8*  --   --   --   --   --   --    < > = values in this interval not displayed.    Cardiac Enzymes: No results for input(s): "CKTOTAL", "CKMB", "CKMBINDEX", "TROPONINI" in the last 168 hours. CBG: Recent Labs  Lab 04/02/23 2033 04/04/23 0801  GLUCAP 103* 71    Iron Studies: No results for input(s): "IRON", "TIBC", "TRANSFERRIN", "FERRITIN" in the last 72 hours. Studies/Results: No results found.  Medications: Infusions:  sodium chloride     anticoagulant sodium citrate      Scheduled Medications:  amiodarone  200 mg Oral Daily   atorvastatin  40 mg Oral QHS   Chlorhexidine Gluconate Cloth  6 each Topical Daily   cinacalcet  30 mg Oral Daily   darbepoetin (ARANESP) injection - DIALYSIS  60 mcg Subcutaneous Q Mon-1800   digoxin  0.25 mg Intravenous Once   digoxin  0.0625 mg Oral Daily   feeding supplement  237 mL Oral BID BM   heparin  5,000 Units Subcutaneous Q8H   leptospermum manuka honey  1 Application Topical Daily   midodrine  20 mg Oral Q8H   pantoprazole  40 mg Oral BID AC   pregabalin  75 mg Oral BID   sevelamer  carbonate  800 mg Oral BID    have reviewed scheduled and prn medications.  Physical Exam: General:NAD, comfortable Heart: Tachycardic, s1s2 nl Lungs:clear b/l, no crackle Abdomen:soft, Non-tender, non-distended Extremities:No edema Dialysis Access: Right IJ TDC in place.  Nile Dorning Prasad Dawud Mays 04/07/2023,11:12 AM  LOS: 9 days

## 2023-04-07 NOTE — Progress Notes (Signed)
Pt transferred to 3E11 without complications.

## 2023-04-07 NOTE — Progress Notes (Signed)
   04/07/23 1620  Vitals  Temp 98 F (36.7 C)  Pulse Rate (!) 119  Resp 15  BP 108/66  SpO2 95 %  Post Treatment  Dialyzer Clearance Clear  Hemodialysis Intake (mL) 0 mL  Liters Processed 75  Fluid Removed (mL) 100 mL  Tolerated HD Treatment Yes   Received patient in bed to unit.  Alert and oriented.  Informed consent signed and in chart.   TX duration:3.5hrs  Patient tolerated well.  Transported back to the room  Alert, without acute distress.  Hand-off given to patient's nurse.   Access used: [facility] Access issues: none  Total UF removed: Medication(s) given: none   Na'Shaminy T R[facility]

## 2023-04-07 NOTE — Plan of Care (Signed)
  Problem: Activity: Goal: Ability to tolerate increased activity will improve Outcome: Progressing   Problem: Respiratory: Goal: Ability to maintain a clear airway and adequate ventilation will improve Outcome: Progressing   Problem: Role Relationship: Goal: Method of communication will improve Outcome: Progressing   Problem: Education: Goal: Knowledge of General Education information will improve Description: Including pain rating scale, medication(s)/side effects and non-pharmacologic comfort measures Outcome: Progressing   Problem: Health Behavior/Discharge Planning: Goal: Ability to manage health-related needs will improve Outcome: Progressing   Problem: Clinical Measurements: Goal: Ability to maintain clinical measurements within normal limits will improve Outcome: Progressing Goal: Will remain free from infection Outcome: Progressing Goal: Diagnostic test results will improve Outcome: Progressing Goal: Respiratory complications will improve Outcome: Progressing Goal: Cardiovascular complication will be avoided Outcome: Progressing   Problem: Activity: Goal: Risk for activity intolerance will decrease Outcome: Progressing   Problem: Nutrition: Goal: Adequate nutrition will be maintained Outcome: Progressing   Problem: Coping: Goal: Level of anxiety will decrease Outcome: Progressing   Problem: Elimination: Goal: Will not experience complications related to bowel motility Outcome: Progressing   Problem: Pain Managment: Goal: General experience of comfort will improve Outcome: Progressing   Problem: Safety: Goal: Ability to remain free from injury will improve Outcome: Progressing   Problem: Skin Integrity: Goal: Risk for impaired skin integrity will decrease Outcome: Progressing

## 2023-04-07 NOTE — Progress Notes (Signed)
PROGRESS NOTE  Justin [name]  WUJ:[id number] DOB: 1945-09-20 DOA: 03/29/2023 PCP: [name], DO   Brief Narrative: Patient is a 77 year old male with history of ESRD on dialysis, GI bleed, rectal abscess, cholecystitis with percutaneous drain, DM, A-fib on Eliquis who presented from hemodialysis unit for the evaluation of hypotension.  He required pressors in the emergency department.  Admitted under PCCM service.  His BP runs chronically low and administered midodrine.  Also has chronic diarrhea.  Started on IV fluid and Levophed in the emergency department.  Also found to have possible abscess in the left ischial area, about 3.5 cm.  Underwent CT guided aspiration of small left ischial abscess by IR on 9/17.  After hemodynamically stability, pressures was discontinued, patient transferred to our service on 9/20.  Hospital course  remarkable for persistent low blood pressure.  Blood pressure now  better after giving fluid bolus.  Tolerated dialysis on 9/23 evening.  Went into rapid A-fib on 9/24, cardiology consulted , given digoxin.  Blood pressure remains low, heart rate in the range of 100-120 today.  Still not ready for discharge  Assessment & Plan:  Principal Problem:   Hypotension  Acute on chronic hypotension:  Patient has history of chronic hypotension.  Also suspected to have septic shock secondary to abscess.  Also has chronic diarrhea and was having lower oral intake.  Pressures and fluid started on ED.  Currently off pressors.  Continue midodrine.  Droxidopa discontinued because this medicine is very expensive.  Patient is afebrile, sepsis physiology has resolved.  Patient is asymptomatic.  He denies any lightheadedness or dizziness.  Given  IV fluid boluses by nephrology.  Blood pressure improved but remains low this morning , systolic in the range of 70s.  She is asymptomatic.  Paroxysmal A-fib: On oral amiodarone.  Not on anticoagulant due to history of recent GI bleed.  Cannot use  rate control agents.  Went into rapid A-fib with heart rate in the range of 100-120 on 9.,24. Cardiology consulted.  Started on digoxin.  Cardiology recommends to start on Eliquis for anticoagulation.  He was on Eliquis in the past.  Heart rate is still in the upper 100s this morning  Left ischial abscess: About 3 cm.  Status post CT-guided drainage.  Wound culture did not show any organism.  He completed antibiotics course.  BLood cultures have been negative so far  History of cholecystectomy: Has right upper quadrant percutaneous cholecystostomy tube. He gets regular exchange  of this tube at [facility].  He follows with [facility].  He has also been referred to general surgery for the consideration of cholecystectomy.  ESRD/hyperkalemia: Nephrology following for dialysis.  Type 2 diabetes: Currently on sliding scale.  Monitor blood sugar  Chronic anemia: Currently hemoglobin stable.  Most likely associated with  history with ESRD.  Deconditioning/debility: Patient wheelchair-bound.  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  Lives with niece        Pressure Injury 12/14/22 Heel Left;Posterior Unstageable - Full thickness tissue loss in which the base of the injury is covered by slough (yellow, tan, gray, green or brown) and/or eschar (tan, brown or black) in the wound bed. (Active)  12/14/22 0700  Location: Heel  Location Orientation: Left;Posterior  Staging: Unstageable - Full thickness tissue loss in which the base of the injury is covered by slough (yellow, tan, gray, green or brown) and/or eschar (tan, brown or black) in the wound bed.  Wound Description (Comments):   Present on  Admission: Yes  Dressing Type Foam - Lift dressing to assess site every shift 04/06/23 2000     Pressure Injury 12/14/22 Heel Right Unstageable - Full thickness tissue loss in which the base of the injury is covered by slough (yellow, tan, gray, green or brown) and/or eschar (tan, brown or  black) in the wound bed. (Active)  12/14/22 0700  Location: Heel  Location Orientation: Right  Staging: Unstageable - Full thickness tissue loss in which the base of the injury is covered by slough (yellow, tan, gray, green or brown) and/or eschar (tan, brown or black) in the wound bed.  Wound Description (Comments):   Present on Admission: Yes  Dressing Type Foam - Lift dressing to assess site every shift 04/06/23 2000    DVT prophylaxis:heparin injection 5,000 Units Start: 03/31/23 0600 SCDs Start: 03/29/23 1754     Code Status: Do not attempt resuscitation (DNR) PRE-ARREST INTERVENTIONS DESIRED  Family Communication: None at bedside  Patient status:Inpatient  Patient is from :Home  Anticipated discharge ZH:YQMV  Estimated DC date: After stabilization of heart rate, blood pressure   Consultants: PCCM, nephrology, cardiology  Procedures: CT-guided abscess drainage, dialysis  Antimicrobials:  Anti-infectives (From admission, onward)    Start     Dose/Rate Route Frequency Ordered Stop   04/05/23 1000  amoxicillin-clavulanate (AUGMENTIN) 500-125 MG per tablet 1 tablet        1 tablet Oral Every 12 hours 04/05/23 0829 04/05/23 2329   03/30/23 1400  piperacillin-tazobactam (ZOSYN) IVPB 2.25 g  Status:  Discontinued        2.25 g 100 mL/hr over 30 Minutes Intravenous Every 8 hours 03/30/23 0801 04/05/23 0755   03/30/23 0900  vancomycin (VANCOREADY) IVPB 1750 mg/350 mL  Status:  Discontinued        1,750 mg 175 mL/hr over 120 Minutes Intravenous  Once 03/30/23 0807 03/30/23 0842   03/29/23 1600  ceFEPIme (MAXIPIME) 2 g in sodium chloride 0.9 % 100 mL IVPB        2 g 200 mL/hr over 30 Minutes Intravenous  Once 03/29/23 1554 03/29/23 1736   03/29/23 1600  vancomycin (VANCOREADY) IVPB 1750 mg/350 mL  Status:  Discontinued        1,750 mg 175 mL/hr over 120 Minutes Intravenous  Once 03/29/23 1554 03/30/23 7846       Subjective: Patient seen and examined at bedside today.   He was comfortable lying in bed.  Systolic blood pressure in the range of 70s.  Denies any lightheadedness or dizziness.  Heart rate ranging from 100-1 20.  Denies any chest pain or shortness of breath  objective: Vitals:   04/07/23 0930 04/07/23 1000 04/07/23 1030 04/07/23 1100  BP: (!) 92/48 (!) 88/58 (!) 81/55 (!) 82/48  Pulse: 100 100 90 79  Resp: 18 18 16 18   Temp:      TempSrc:      SpO2: 95% 95% 92% 94%  Weight:      Height:        Intake/Output Summary (Last 24 hours) at 04/07/2023 1124 Last data filed at 04/07/2023 0800 Gross per 24 hour  Intake 436 ml  Output 210 ml  Net 226 ml   Filed Weights   04/05/23 1817 04/06/23 0500 04/07/23 0500  Weight: 83.4 kg 83.4 kg 83.4 kg    Examination:  General exam: Overall comfortable, not in distress,lying on bed HEENT: PERRL Respiratory system:  no wheezes or crackles  Cardiovascular system: Irregular regular rhythm, dialysis catheter on the right  chest Gastrointestinal system: Abdomen is nondistended, soft and nontender. Central nervous system: Alert and oriented, bilateral lower extremity weakness Extremities: No edema, no clubbing ,no cyanosis Skin: No rashes, no ulcers,no icterus        Data Reviewed: I have personally reviewed following labs and imaging studies  CBC: Recent Labs  Lab 04/01/23 0223 04/02/23 0304 04/05/23 1646  WBC 7.9 6.7 8.9  HGB 10.0* 10.3* 9.8*  HCT 31.9* 33.3* 31.9*  MCV 97.6 97.4 100.9*  PLT 192 180 186   Basic Metabolic Panel: Recent Labs  Lab 04/02/23 0304 04/03/23 1547 04/05/23 1646  NA 134*  --  138  K 3.5  --  3.6  CL 96*  --  103  CO2 24  --  23  GLUCOSE 74  --  140*  BUN 32*  --  46*  CREATININE 7.77*  --  8.73*  CALCIUM 7.7*  --  7.7*  PHOS  --  4.9* 4.4     Recent Results (from the past 240 hour(s))  Blood Culture (routine x 2)     Status: None   Collection Time: 03/29/23  8:47 AM   Specimen: BLOOD RIGHT ARM  Result Value Ref Range Status   Specimen Description  BLOOD RIGHT ARM  Final   Special Requests   Final    BOTTLES DRAWN AEROBIC AND ANAEROBIC Blood Culture adequate volume   Culture   Final    NO GROWTH 5 DAYS Performed at [facility], [address] [place], [place] [address]    Report Status 04/03/2023 FINAL  Final  MRSA Next Gen by PCR, Nasal     Status: None   Collection Time: 03/29/23  7:25 PM   Specimen: Nasal Mucosa; Nasal Swab  Result Value Ref Range Status   MRSA by PCR Next Gen NOT DETECTED NOT DETECTED Final    Comment: (NOTE) The GeneXpert MRSA Assay (FDA approved for NASAL specimens only), is one component of a comprehensive MRSA colonization surveillance program. It is not intended to diagnose MRSA infection nor to guide or monitor treatment for MRSA infections. Test performance is not FDA approved in patients less than 44 years old. Performed at [facility], [address], [place], [place] [address]   Blood Culture (routine x 2)     Status: None   Collection Time: 03/29/23  9:20 PM   Specimen: BLOOD RIGHT HAND  Result Value Ref Range Status   Specimen Description BLOOD RIGHT HAND  Final   Special Requests   Final    BOTTLES DRAWN AEROBIC AND ANAEROBIC Blood Culture adequate volume   Culture   Final    NO GROWTH 5 DAYS Performed at [facility], [address], [place], [place] [address]    Report Status 04/03/2023 FINAL  Final  Aerobic/Anaerobic Culture w Gram Stain (surgical/deep wound)     Status: None   Collection Time: 03/30/23  8:19 AM   Specimen: Abscess  Result Value Ref Range Status   Specimen Description ABSCESS  Final   Special Requests LEFT ISCHIAL  Final   Gram Stain   Final    RARE WBC PRESENT, PREDOMINANTLY PMN NO ORGANISMS SEEN    Culture   Final    No growth aerobically or anaerobically. Performed at [facility], [address] [place], [place] [address]    Report Status 04/04/2023 FINAL  Final     Radiology Studies: No results found.  Scheduled Meds:   amiodarone  200 mg Oral Daily   atorvastatin  40 mg Oral QHS   Chlorhexidine Gluconate Cloth  6 each Topical Daily   cinacalcet  30 mg Oral Daily   darbepoetin (ARANESP) injection - DIALYSIS  60 mcg Subcutaneous Q Mon-1800   digoxin  0.25 mg Intravenous Once   digoxin  0.0625 mg Oral Daily   feeding supplement  237 mL Oral BID BM   heparin  5,000 Units Subcutaneous Q8H   leptospermum manuka honey  1 Application Topical Daily   midodrine  20 mg Oral Q8H   pantoprazole  40 mg Oral BID AC   pregabalin  75 mg Oral BID   sevelamer carbonate  800 mg Oral BID   Continuous Infusions:  sodium chloride     anticoagulant sodium citrate       LOS: 9 days   Burnadette Pop, MD [facility] P9/25/2024, 11:24 AM

## 2023-04-08 ENCOUNTER — Other Ambulatory Visit (HOSPITAL_COMMUNITY): Payer: Self-pay

## 2023-04-08 DIAGNOSIS — I959 Hypotension, unspecified: Secondary | ICD-10-CM | POA: Diagnosis not present

## 2023-04-08 LAB — DIGOXIN LEVEL: Digoxin Level: 1.3 ng/mL (ref 0.8–2.0)

## 2023-04-08 MED ORDER — APIXABAN 2.5 MG PO TABS
2.5000 mg | ORAL_TABLET | Freq: Two times a day (BID) | ORAL | 0 refills | Status: DC
Start: 2023-04-08 — End: 2023-06-05
  Filled 2023-04-08: qty 60, 30d supply, fill #0

## 2023-04-08 MED ORDER — MIDODRINE HCL 10 MG PO TABS
20.0000 mg | ORAL_TABLET | Freq: Three times a day (TID) | ORAL | 0 refills | Status: DC
  Filled 2023-04-08: qty 180, 30d supply, fill #0

## 2023-04-08 MED ORDER — DIGOXIN 62.5 MCG PO TABS
1.0000 | ORAL_TABLET | ORAL | 1 refills | Status: DC
  Filled 2023-04-08: qty 15, 30d supply, fill #0

## 2023-04-08 MED ORDER — AMIODARONE HCL 200 MG PO TABS
200.0000 mg | ORAL_TABLET | Freq: Every day | ORAL | Status: DC
  Administered 2023-04-08: 200 mg via ORAL
  Filled 2023-04-08: qty 1

## 2023-04-08 MED ORDER — AMIODARONE HCL 200 MG PO TABS
200.0000 mg | ORAL_TABLET | Freq: Every day | ORAL | 0 refills | Status: DC
  Filled 2023-04-08: qty 60, 60d supply, fill #0

## 2023-04-08 MED ORDER — DIGOXIN 62.5 MCG PO TABS
0.0625 mg | ORAL_TABLET | Freq: Every day | ORAL | 0 refills | Status: DC
  Filled 2023-04-08: qty 60, fill #0

## 2023-04-08 MED ORDER — DIGOXIN 125 MCG PO TABS: 0.0625 mg | ORAL_TABLET | ORAL | Status: DC

## 2023-04-08 NOTE — Progress Notes (Signed)
D/C order noted. Contacted [facility] to advise clinic of pt's d/c today and that pt should resume care tomorrow.   Olivia Canter Renal Navigator [phone]

## 2023-04-08 NOTE — Plan of Care (Signed)
[facility] Patient Discharge Orders- [facility] CLINIC: [place]  Patient's name: Justin [name] Admit/DC Dates: 03/29/2023 - 04/08/2023  Discharge Diagnoses: Septic shock  2/2 to ischial abscess Chronic hypotension -- on midodrine  AFib with RVR -- Eliquis started   Aranesp: Given: Yes   Date and amount of last dose: 9/23  60 mcg   Last Hgb: 9.4 PRBC's Given: -- Date/# of units: -- ESA dose for discharge: Mircera 75 mcg IV q 2 weeks  IV Iron dose at discharge: --  Heparin change: No heparin   EDW Change: Yes New EDW:  82 kg   Bath Change: --  Access intervention/Change: --   Hectorol/Calcitriol change: --  Discharge Labs: Calcium 8.0 Phosphorus 3.3 Albumin 2.0 K+ 3.4  IV Antibiotics: --     OTHER/APPTS/LAB ORDERS:    D/C Meds to be reconciled by nurse after every discharge.  Completed By: Tomasa Blase PA-C 04/08/2023,2:17 PM   Reviewed by: MD:______ RN_______

## 2023-04-08 NOTE — TOC Transition Note (Addendum)
Transition of Care [facility]) - CM/SW Discharge Note   Patient Details  Name: Justin [name] MRN: [id number] Date of Birth: 01-17-1946  Transition of [facility]) CM/SW Contact:  Leone Haven, RN Phone Number: 04/08/2023, 11:17 AM   Clinical Narrative:    For dc today, he states his niece will transport him home, he conts with chronic percutaneous drain which he goes to [place] once a month.  Per MD note  He gets regular exchange of this tube at [facility]. He follows with [facility]. He has also been referred to general surgery for the consideration of cholecystectomy. He has a w/chair at home.   He has no needs.  Will be on eliquis TOC to fill meds.  Per [facility] pharmacy he has used the eliquis 30 day free trial already, so one month supply of eliquis will be 149.53 for him and two months will be 298.67.  he only needs to take eliquis for two months per [facility] pharmacy.         Patient Goals and CMS Choice      Discharge Placement                         Discharge Plan and Services Additional resources added to the After Visit Summary for                                       Social Determinants of Health (SDOH) Interventions SDOH Screenings   Food Insecurity: No Food Insecurity (03/29/2023)  Housing: Low Risk  (03/29/2023)  Transportation Needs: No Transportation Needs (03/29/2023)  Utilities: Not At Risk (03/29/2023)  Social Connections: Unknown (10/13/2022)   Received from [facility], [facility]  Stress: No Stress Concern Present (10/14/2022)   Received from [facility], [facility]  Tobacco Use: Medium Risk (03/29/2023)     Readmission Risk Interventions    03/30/2023    2:29 PM 11/14/2022   10:08 AM 09/18/2022    4:10 PM  Readmission Risk Prevention Plan  Transportation Screening Complete Complete Complete  Medication Review (RN Care Manager) Referral to Pharmacy Complete Complete  PCP or Specialist appointment within 3-5 days of discharge Complete  Complete Complete  HRI or Home Care Consult Complete Complete Complete  SW Recovery Care/Counseling Consult Complete Complete   Palliative Care Screening Not Applicable Not Applicable Complete  Skilled Nursing Facility Not Applicable Complete Not Applicable

## 2023-04-08 NOTE — Progress Notes (Signed)
Subjective:  Patient denies any chest pain or shortness of breath.  Heart rate and blood pressure better controlled.  States has not had any bleeding since last few months.  Off Eliquis was not restarted.  States was taking 5 mg twice daily.  Discussed with patient regarding restarting Eliquis at lower dose and agrees.  Patient understands the risk and benefits.  Objective:  Vital Signs in the last 24 hours: Temp:  [96.6 F (35.9 C)-98.3 F (36.8 C)] 98.1 F (36.7 C) (09/26 0400) Pulse Rate:  [51-119] 70 (09/26 0400) Resp:  [4-26] 14 (09/26 0400) BP: (73-129)/(33-110) 97/56 (09/26 0400) SpO2:  [91 %-99 %] 99 % (09/26 0400) Weight:  [84.6 kg] 84.6 kg (09/26 0400)  Intake/Output from previous day: 09/25 0701 - 09/26 0700 In: 236 [P.O.:236] Out: 175 [Drains:75] Intake/Output from this shift: No intake/output data recorded.  Physical Exam: Exam unchanged  Lab Results: Recent Labs    04/05/23 1646 04/07/23 1024  WBC 8.9 8.3  HGB 9.8* 9.4*  PLT 186 170   Recent Labs    04/05/23 1646 04/07/23 1024  NA 138 137  K 3.6 3.4*  CL 103 100  CO2 23 28  GLUCOSE 140* 127*  BUN 46* 34*  CREATININE 8.73* 7.20*   No results for input(s): "TROPONINI" in the last 72 hours.  Invalid input(s): "CK", "MB" Hepatic Function Panel Recent Labs    04/07/23 1024  ALBUMIN 2.0*   No results for input(s): "CHOL" in the last 72 hours. No results for input(s): "PROTIME" in the last 72 hours.  Imaging: Imaging results have been reviewed and No results found.  Cardiac Studies:  Assessment/Plan:  Chronic atrial fibrillation with moderate ventricular response CHA2DS2-VASc score of 6 Chronic hypotension Moderate aortic stenosis History of HFpEF Diabetes mellitus Diabetic retinopathy with history of vitreous hemorrhage Diabetic neuropathy End-stage renal disease on hemodialysis Anemia of chronic disease History of perirectal abscess/septic shock Cholecystitis status post percutaneous  drain in the past Plan Restart Eliquis 2.5 mg twice daily Patient advised to watch for bleeding and call 911 if notices black tarry stool or bright red blood per rectum. Okay to discharge from cardiac point of view I will sign off Follow-up with me in 1 to 2 weeks/as needed   LOS: 10 days    [name] 04/08/2023, 9:19 AM

## 2023-04-08 NOTE — Progress Notes (Signed)
[facility] NEPHROLOGY PROGRESS NOTE  Assessment/ Plan: Pt is a 77 y.o. yo male   OP HD: [facility] 3h  450/ 1.5  77.5kg  2/2.5 bath   R TDC   Heparin none - last OP HD 9/13, post wt 80kg - lowest in-session BP's have been SBP 40s- 80s last 2 wks, maybe chronic issue - coming off 3-4kg over dry wt the last 2 wks - mircera 75 q 2 wks, last 9/09, due 9/23  # Shock - acute on chronic hypotension. Ischial abscess drained by IR 9/18.  Antibiotics per primary team.  Currently on midodrine 20 mg 3 times daily.  # ESRD/volume- MWF.  Status post dialysis yesterday, tolerated well.  Next HD tomorrow.  #Chronic hypotension - cont midodrine 20 tid here as at home. Droxidopa cancelled due to high outpatient costs.  We have changed dialysate temperature to 36, required albumin as needed to mitigate IDH. He is clinically asymptomatic with low blood pressure therefore this might be his new baseline BP.  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 Continue ESA.    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 Continuing binder and sensipar.   # A-fib with RVR: Seen by cardiology.  On amiodarone and digoxin.  No anticoagulation because of GI bleed.    Subjective: Seen and examined.  Tolerated dialysis well.  He was moved out of ICU.  Chronic hypotension, asymptomatic.  Heart rate improved.  No other event.  Likely discharge home today. Objective Vital signs in last 24 hours: Vitals:   04/08/23 0003 04/08/23 0400 04/08/23 0845 04/08/23 1020  BP:  (!) 97/56  (!) 82/56  Pulse:  70    Resp:  14 (!) 24 13  Temp: 98 F (36.7 C) 98.1 F (36.7 C)    TempSrc: Oral Oral    SpO2:  99%    Weight:  84.6 kg    Height:       Weight change: 1.2 kg  Intake/Output Summary (Last 24 hours) at 04/08/2023 1050 Last data filed at 04/07/2023 1800 Gross per 24 hour  Intake --  Output 175 ml  Net -175 ml       Labs: RENAL PANEL Recent Labs  Lab 04/02/23 0304 04/03/23 1547 04/05/23 1646 04/07/23 1024   NA 134*  --  138 137  K 3.5  --  3.6 3.4*  CL 96*  --  103 100  CO2 24  --  23 28  GLUCOSE 74  --  140* 127*  BUN 32*  --  46* 34*  CREATININE 7.77*  --  8.73* 7.20*  CALCIUM 7.7*  --  7.7* 8.0*  PHOS  --  4.9* 4.4 3.3  ALBUMIN  --   --  2.1* 2.0*    Liver Function Tests: Recent Labs  Lab 04/05/23 1646 04/07/23 1024  ALBUMIN 2.1* 2.0*   No results for input(s): "LIPASE", "AMYLASE" in the last 168 hours. No results for input(s): "AMMONIA" in the last 168 hours. CBC: Recent Labs    09/11/22 0616 09/12/22 0444 03/31/23 0350 04/01/23 0223 04/02/23 0304 04/05/23 1646 04/07/23 1024  HGB 8.4*   < > 9.9* 10.0* 10.3* 9.8* 9.4*  MCV 105.6*   < > 99.7 97.6 97.4 100.9* 97.7  VITAMINB12 848  --   --   --   --   --   --   FOLATE 25.6  --   --   --   --   --   --  FERRITIN 1,126*  --   --   --   --   --   --   TIBC 200*  --   --   --   --   --   --   IRON 54  --   --   --   --   --   --   RETICCTPCT 3.8*  --   --   --   --   --   --    < > = values in this interval not displayed.    Cardiac Enzymes: No results for input(s): "CKTOTAL", "CKMB", "CKMBINDEX", "TROPONINI" in the last 168 hours. CBG: Recent Labs  Lab 04/02/23 2033 04/04/23 0801  GLUCAP 103* 71    Iron Studies: No results for input(s): "IRON", "TIBC", "TRANSFERRIN", "FERRITIN" in the last 72 hours. Studies/Results: No results found.  Medications: Infusions:  sodium chloride     anticoagulant sodium citrate      Scheduled Medications:  amiodarone  200 mg Oral Daily   atorvastatin  40 mg Oral QHS   Chlorhexidine Gluconate Cloth  6 each Topical Daily   cinacalcet  30 mg Oral Daily   darbepoetin (ARANESP) injection - DIALYSIS  60 mcg Subcutaneous Q Mon-1800   digoxin  0.0625 mg Oral Daily   feeding supplement  237 mL Oral BID BM   heparin  5,000 Units Subcutaneous Q8H   leptospermum manuka honey  1 Application Topical Daily   midodrine  20 mg Oral Q8H   pantoprazole  40 mg Oral BID AC   pregabalin   75 mg Oral BID   sevelamer carbonate  800 mg Oral BID    have reviewed scheduled and prn medications.  Physical Exam: General:NAD, comfortable Heart: Regular rate rhythm, s1s2 nl Lungs:clear b/l, no crackle Abdomen:soft, Non-tender, non-distended Extremities:No edema Dialysis Access: Right IJ TDC in place.  Russel Morain Prasad Parker Sawatzky 04/08/2023,10:50 AM  LOS: 10 days

## 2023-04-08 NOTE — Discharge Summary (Addendum)
Physician Discharge Summary  Justin [name] XBM:[id number] DOB: 1945-09-09 DOA: 03/29/2023  PCP: [name], DO  Admit date: 03/29/2023 Discharge date: 04/08/2023  Admitted From: Home Disposition:  Home  Discharge Condition:Stable CODE STATUS:DNR Diet recommendation: Renal  Brief/Interim Summary: Patient is a 77 year old male with history of ESRD on dialysis, GI bleed, rectal abscess, cholecystitis with percutaneous drain, DM, A-fib on Eliquis who presented from hemodialysis unit for the evaluation of hypotension.  He required pressors in the emergency department.  Admitted under PCCM service.  His BP runs chronically low and administered midodrine.  Also has chronic diarrhea.  Started on IV fluid and Levophed in the emergency department.  Also found to have possible abscess in the left ischial area, about 3.5 cm.  Underwent CT guided aspiration of small left ischial abscess by IR on 9/17.  After hemodynamically stability, pressures was discontinued, patient transferred to our service on 9/20.  Hospital course  remarkable for persistent low blood pressure. He has been tolerating dialysis.  Went into rapid A-fib on 9/24, cardiology consulted , started digoxin.  Blood pressure remains low, but the this is baseline and is asymptomatic.  Cardiology, nephrology cleared for discharge.  Following problems were addressed during the hospitalization:  Acute on chronic hypotension:  Patient has history of chronic hypotension.  Also suspected to have septic shock secondary to abscess.  Pressures and fluid started on ED.  Currently off pressors.  Continue midodrine.  Droxidopa discontinued because this medicine is very expensive.  Patient is afebrile, sepsis physiology has resolved.  Patient is asymptomatic.  He denies any lightheadedness or dizziness.  Given  IV fluid boluses by nephrology.  Blood pressure improved but remains chronically low.     Paroxysmal A-fib: On oral amiodarone.  Not on anticoagulant  due to history of recent GI bleed.  Cannot use rate control agents.  Went into rapid A-fib with heart rate in the range of 100-120 on 9.,24. Cardiology consulted.  Started on digoxin.  Cardiology recommends to start on low-dose Eliquis for anticoagulation.  He was on Eliquis in the past.  He will follow-up with cardiology as an outpatient  Left ischial abscess: About 3 cm.  Status post CT-guided drainage.  Wound culture did not show any organism.  He completed antibiotics course.  BLood cultures have been negative so far   History of cholecystectomy: Has right upper quadrant percutaneous cholecystostomy tube. He gets regular exchange  of this tube at [facility].  He follows with [facility].  He has also been referred to general surgery for the consideration of cholecystectomy.   ESRD/hyperkalemia: Nephrology was following for dialysis.   HLD : Continue home regimen   Chronic anemia: Currently hemoglobin stable.  Most likely associated with  ESRD.   Deconditioning/debility: Patient wheelchair-bound.  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  Lives with niece  Discharge Diagnoses:  Principal Problem:   Hypotension    Discharge Instructions  Discharge Instructions     Diet - low sodium heart healthy   Complete by: As directed    Discharge instructions   Complete by: As directed    1)Please take prescribed medications as instructed 2)Follow up with your PCP in a week, do a CBC test to check your hemoglobin a week 3)Continue dialysis sessions on the scheduled days 4)Follow up with cardiology as an outpatient.  Name and number of the provider has been attached 5)Follow up with gastroenterology as an outpatient   Increase activity slowly   Complete by: As directed  No wound care   Complete by: As directed       Allergies as of 04/08/2023   No Known Allergies      Medication List     TAKE these medications    acetaminophen 325 MG tablet Commonly known as:  TYLENOL Take 2 tablets (650 mg total) by mouth every 6 (six) hours as needed for mild pain (or Fever >/= 101).   amiodarone 200 MG tablet Commonly known as: PACERONE Take 1 tablet (200 mg total) by mouth daily. Start taking on: April 09, 2023 What changed: See the new instructions.   apixaban 2.5 MG Tabs tablet Commonly known as: Eliquis Take 1 tablet (2.5 mg total) by mouth 2 (two) times daily.   atorvastatin 40 MG tablet Commonly known as: LIPITOR Take 40 mg by mouth at bedtime.   b complex-vitamin c-folic acid 0.8 MG Tabs tablet Take 1 tablet by mouth at bedtime.   calcitRIOL 0.25 MCG capsule Commonly known as: ROCALTROL Take 3 capsules (0.75 mcg total) by mouth every Monday, Wednesday, and Friday with hemodialysis. What changed:  how much to take when to take this   cinacalcet 30 MG tablet Commonly known as: SENSIPAR Take 30 mg by mouth daily.   Digoxin 62.5 MCG Tabs Take 0.0625 mg by mouth every other day. Start taking on: April 10, 2023   midodrine 10 MG tablet Commonly known as: PROAMATINE Take 2 tablets (20 mg total) by mouth 3 (three) times daily. What changed: when to take this   pantoprazole 40 MG tablet Commonly known as: Protonix Take 1 tablet (40 mg total) by mouth 2 (two) times daily before a meal.   Pataday 0.2 % Soln Generic drug: Olopatadine HCl Place 1 drop into both eyes daily.   pregabalin 75 MG capsule Commonly known as: LYRICA Take 1 capsule (75 mg total) by mouth at bedtime.   sevelamer carbonate 800 MG tablet Commonly known as: RENVELA Take 800 mg by mouth 2 (two) times daily.   traZODone 50 MG tablet Commonly known as: DESYREL Take 1 tablet (50 mg total) by mouth at bedtime as needed for sleep.        Follow-up Information     [name], DO Follow up.   Specialty: Family Medicine Contact information: [address] [place] [address] [phone]         [name], MD. Schedule an appointment as soon  as possible for a visit in 4 week(s).   Specialty: Cardiology Contact information: [address] [place] [address] [phone]                No Known Allergies  Consultations: Nephrology, cardiology, PCCM   Procedures/Studies: CT GUIDED NEEDLE PLACEMENT  Result Date: 03/30/2023 INDICATION: [id number] Abscess [id number] EXAM: CT-GUIDED ASPIRATION OF LEFT ISCHIAL ABSCESS COMPARISON:  CT AP, 03/29/2023. MEDICATIONS: The patient is currently admitted to the hospital and receiving intravenous antibiotics. The antibiotics were administered within an appropriate time frame prior to the initiation of the procedure. ANESTHESIA/SEDATION: Local anesthetic was administered. The patient was continuously monitored during the procedure by the interventional radiology nurse under my direct supervision. CONTRAST:  None COMPLICATIONS: None immediate. PROCEDURE: RADIATION DOSE REDUCTION: This exam was performed according to the departmental dose-optimization program which includes automated exposure control, adjustment of the mA and/or kV according to patient size and/or use of iterative reconstruction technique. Informed written consent was obtained from the patient and/or patient's representative after a discussion of the risks, benefits and alternatives  to treatment. The patient was placed prone on the CT gantry and a pre procedural CT was performed re-demonstrating the known abscess/fluid collection within the LEFT ischial soft tissues. The procedure was planned. A timeout was performed prior to the initiation of the procedure. The LEFT gluteus was prepped and draped in the usual sterile fashion. The overlying soft tissues were anesthetized with 1% lidocaine with epinephrine. Appropriate trajectory was planned with the use of a 22 gauge spinal needle. An 18 gauge trocar needle was advanced into the abscess/fluid collection and attempted aspiration was performed. No significant residual fluid  was obtained, therefore lavaged with 5 mL sterile saline solution was performed. 3 mL of serosanguineous fluid was aspirated. The needle was removed and dressing was placed. The patient tolerated the procedure well without immediate post procedural complication. IMPRESSION: Successful CT guided aspiration of a small LEFT ischial abscess, as described above. Samples were sent to the laboratory as requested by the ordering clinical team. Roanna Banning, MD [facility] Radiology Electronically Signed   By: Roanna Banning M.D.   On: 03/30/2023 18:24   VAS [place] LOWER EXTREMITY VENOUS (DVT) (ONLY [facility] & [facility])  Result Date: 03/29/2023  Lower Venous DVT Study Patient Name:  HENRIQUE KIRNER  Date of Exam:   03/29/2023 Medical Rec #: [id number]       Accession #:    [id number] Date of Birth: 1945/12/10       Patient Gender: M Patient Age:   40 years Exam Location:  [facility] Procedure:      VAS [place] LOWER EXTREMITY VENOUS (DVT) Referring Phys: Ernie Avena --------------------------------------------------------------------------------  Indications: Edema, and Hypotension and headache today prior to receiving dialysis.  Limitations: Patient somnolence, edema of calves, unable to remove pants, and inability to keep leg on bed. Comparison Study: No prior study on file Performing Technologist: Sherren Kerns RVS  Examination Guidelines: A complete evaluation includes B-mode imaging, spectral Doppler, color Doppler, and power Doppler as needed of all accessible portions of each vessel. Bilateral testing is considered an integral part of a complete examination. Limited examinations for reoccurring indications may be performed as noted. The reflux portion of the exam is performed with the patient in reverse Trendelenburg.  +---------+---------------+---------+-----------+---------------+--------------+ RIGHT    CompressibilityPhasicitySpontaneityProperties     Thrombus Aging  +---------+---------------+---------+-----------+---------------+--------------+ CFV      Full                               pulsatile                                                                 waveforms                     +---------+---------------+---------+-----------+---------------+--------------+ SFJ      Full                                                             +---------+---------------+---------+-----------+---------------+--------------+ FV Prox  pulsatile      patent by                                                  waveforms      color and                                                                 Doppler        +---------+---------------+---------+-----------+---------------+--------------+ FV Mid   Full                               pulsatile      patent by                                                  waveforms      color and                                                                 Doppler        +---------+---------------+---------+-----------+---------------+--------------+ FV Distal                                   pulsatile      patent by                                                  waveforms      color and                                                                 Doppler        +---------+---------------+---------+-----------+---------------+--------------+ PFV                                         pulsatile      patent by                                                  waveforms      color and  Doppler        +---------+---------------+---------+-----------+---------------+--------------+ POP                                         pulsatile      patent by                                                  waveforms      color and                                                                  Doppler        +---------+---------------+---------+-----------+---------------+--------------+ PTV                                                        Not well                                                                  visualized     +---------+---------------+---------+-----------+---------------+--------------+ PERO                                                       Not well                                                                  visualized     +---------+---------------+---------+-----------+---------------+--------------+   +---------+---------------+---------+-----------+---------------+--------------+ LEFT     CompressibilityPhasicitySpontaneityProperties     Thrombus Aging +---------+---------------+---------+-----------+---------------+--------------+ CFV      Full                               pulsatile                                                                 waveforms                     +---------+---------------+---------+-----------+---------------+--------------+ SFJ      Full                                                             +---------+---------------+---------+-----------+---------------+--------------+  FV Prox  Full                               pulsatile                                                                 waveforms                     +---------+---------------+---------+-----------+---------------+--------------+ FV Mid   Full                               pulsatile                                                                 waveforms                     +---------+---------------+---------+-----------+---------------+--------------+ FV Distal                                   pulsatile      patent by                                                  waveforms      color and                                                                  Doppler        +---------+---------------+---------+-----------+---------------+--------------+ PFV      Full                               pulsatile                                                                 waveforms                     +---------+---------------+---------+-----------+---------------+--------------+ POP                                         pulsatile      patent by  waveforms      color and                                                                 Doppler        +---------+---------------+---------+-----------+---------------+--------------+ PTV                                                        Not well                                                                  visualized     +---------+---------------+---------+-----------+---------------+--------------+ PERO                                                       Not well                                                                  visualized     +---------+---------------+---------+-----------+---------------+--------------+ Gastroc                                     pulsatile      patent by                                                  waveforms      color and                                                                 Doppler        +---------+---------------+---------+-----------+---------------+--------------+     Summary: RIGHT: - There is no evidence of deep vein thrombosis in the lower extremity. However, portions of this examination were limited- see technologist comments above.  pulsatile waveforms consistent with fluid overload  LEFT: - There is no evidence of deep vein thrombosis in the lower extremity. However, portions of this examination were limited- see technologist comments above.  Pulsatile waveforms  consistent with fluid overload.  *See table(s) above for measurements and observations. Electronically signed by Lemar Livings MD on 03/29/2023  at 4:43:22 PM.    Final    CT Angio Chest PE W and/or Wo Contrast  Result Date: 03/29/2023 CLINICAL DATA:  Hypotension during dialysis, headache, sepsis EXAM: CT ANGIOGRAPHY CHEST CT ABDOMEN AND PELVIS WITH CONTRAST TECHNIQUE: Multidetector CT imaging of the chest was performed using the standard protocol during bolus administration of intravenous contrast. Multiplanar CT image reconstructions and MIPs were obtained to evaluate the vascular anatomy. Multidetector CT imaging of the abdomen and pelvis was performed using the standard protocol during bolus administration of intravenous contrast. RADIATION DOSE REDUCTION: This exam was performed according to the departmental dose-optimization program which includes automated exposure control, adjustment of the mA and/or kV according to patient size and/or use of iterative reconstruction technique. CONTRAST:  75mL OMNIPAQUE IOHEXOL 350 MG/ML SOLN COMPARISON:  03/29/2023, 12/14/2022 FINDINGS: CTA CHEST FINDINGS Cardiovascular: This is a technically adequate evaluation of the pulmonary vasculature. No filling defects or pulmonary emboli. The heart is unremarkable without pericardial effusion. No evidence of thoracic aortic aneurysm or dissection. Atherosclerosis of the aorta and coronary vasculature. Mediastinum/Nodes: No enlarged mediastinal, hilar, or axillary lymph nodes. Thyroid gland, trachea, and esophagus demonstrate no significant findings. Lungs/Pleura: Mild upper lobe predominant emphysema. Bibasilar hypoventilatory changes. No acute airspace disease, effusion, or pneumothorax. Central airways are patent. 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 No acute or destructive bony abnormalities. Reconstructed images demonstrate no additional findings. Review of the MIP images confirms the  above findings. CT ABDOMEN and PELVIS FINDINGS Hepatobiliary: Percutaneous cholecystostomy tube again coiled within the gallbladder lumen. Calcified gallstones with mild gallbladder wall thickening unchanged. No pericholecystic fat stranding or free fluid. Liver is unremarkable. Mild stable intrahepatic biliary duct dilation. Pancreas: Unremarkable. No pancreatic ductal dilatation or surrounding inflammatory changes. Spleen: Normal in size without focal abnormality. Adrenals/Urinary Tract: Chronic renal atrophy consistent with history of end-stage renal disease. No acute renal findings. Bladder is decompressed, limiting its evaluation. The adrenals are stable. Stomach/Bowel: No bowel obstruction or ileus. Normal appendix right lower quadrant. Moderate retained stool, most pronounced in the rectal wall, which could reflect an element of fecal impaction. There is mild rectal wall thickening and presacral fat stranding which could reflect an element of stercoral colitis. The mass lesion seen within the gastric cardia on prior study is difficult to appreciate on this exam due to under distension, though is likely seen on image 19/3 measuring up to 3.9 cm. Vascular/Lymphatic: Aortic atherosclerosis. No enlarged abdominal or pelvic lymph nodes. Reproductive: Stable enlargement of the prostate with fiduciary markers again noted. Other: No free fluid or free intraperitoneal gas. No abdominal wall hernia. Mild diffuse body wall edema. Musculoskeletal: Stable findings of renal osteodystrophy. There is a new rim enhancing fluid collection along the inferior margin of the left ischium, measuring up to 3.3 x 3.1 x 3.5 cm reference image 97/3. No evidence of underlying bony abnormality. No acute fractures. Reconstructed images demonstrate no additional findings. Review of the MIP images confirms the above findings. IMPRESSION: Chest: 1. No evidence of pulmonary embolus. 2. Bibasilar hypoventilatory changes.  No acute airspace  disease. 3. Aortic Atherosclerosis (ICD10-I70.0) and Emphysema (ICD10-J43.9). Abdomen/pelvis: 1. New 3.5 cm rim enhancing fluid collection within the soft tissues adjacent to the inferior margin of the left ischium, suspicious for soft tissue abscess. No evidence of underlying osteomyelitis or signs of overlying soft tissue ulceration. 2. Moderate retained stool within the rectal vault, consistent with fecal impaction. Mild rectal wall thickening and perirectal fat stranding could reflect superimposed stercoral colitis. 3. Stable indwelling cholecystostomy  tube, with decompression of the gallbladder. Stable calcified gallstones noted. 4. Findings consistent with known end-stage renal disease, with diffuse renal osteodystrophy again noted. 5.  Aortic Atherosclerosis (ICD10-I70.0). 6. 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 Electronically Signed   By: Sharlet Salina M.D.   On: 03/29/2023 15:38   CT ABDOMEN PELVIS W CONTRAST  Result Date: 03/29/2023 CLINICAL DATA:  Hypotension during dialysis, headache, sepsis EXAM: CT ANGIOGRAPHY CHEST CT ABDOMEN AND PELVIS WITH CONTRAST TECHNIQUE: Multidetector CT imaging of the chest was performed using the standard protocol during bolus administration of intravenous contrast. Multiplanar CT image reconstructions and MIPs were obtained to evaluate the vascular anatomy. Multidetector CT imaging of the abdomen and pelvis was performed using the standard protocol during bolus administration of intravenous contrast. RADIATION DOSE REDUCTION: This exam was performed according to the departmental dose-optimization program which includes automated exposure control, adjustment of the mA and/or kV according to patient size and/or use of iterative reconstruction technique. CONTRAST:  75mL OMNIPAQUE  IOHEXOL 350 MG/ML SOLN COMPARISON:  03/29/2023, 12/14/2022 FINDINGS: CTA CHEST FINDINGS Cardiovascular: This is a technically adequate evaluation of the pulmonary vasculature. No filling defects or pulmonary emboli. The heart is unremarkable without pericardial effusion. No evidence of thoracic aortic aneurysm or dissection. Atherosclerosis of the aorta and coronary vasculature. Mediastinum/Nodes: No enlarged mediastinal, hilar, or axillary lymph nodes. Thyroid gland, trachea, and esophagus demonstrate no significant findings. Lungs/Pleura: Mild upper lobe predominant emphysema. Bibasilar hypoventilatory changes. No acute airspace disease, effusion, or pneumothorax. Central airways are patent. 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 No acute or destructive bony abnormalities. Reconstructed images demonstrate no additional findings. Review of the MIP images confirms the above findings. CT ABDOMEN and PELVIS FINDINGS Hepatobiliary: Percutaneous cholecystostomy tube again coiled within the gallbladder lumen. Calcified gallstones with mild gallbladder wall thickening unchanged. No pericholecystic fat stranding or free fluid. Liver is unremarkable. Mild stable intrahepatic biliary duct dilation. Pancreas: Unremarkable. No pancreatic ductal dilatation or surrounding inflammatory changes. Spleen: Normal in size without focal abnormality. Adrenals/Urinary Tract: Chronic renal atrophy consistent with history of end-stage renal disease. No acute renal findings. Bladder is decompressed, limiting its evaluation. The adrenals are stable. Stomach/Bowel: No bowel obstruction or ileus. Normal appendix right lower quadrant. Moderate retained stool, most pronounced in the rectal wall, which could reflect an element of fecal impaction. There is mild rectal wall thickening and presacral fat stranding which could reflect an element of stercoral colitis. The mass lesion seen within the gastric cardia  on prior study is difficult to appreciate on this exam due to under distension, though is likely seen on image 19/3 measuring up to 3.9 cm. Vascular/Lymphatic: Aortic atherosclerosis. No enlarged abdominal or pelvic lymph nodes. Reproductive: Stable enlargement of the prostate with fiduciary markers again noted. Other: No free fluid or free intraperitoneal gas. No abdominal wall hernia. Mild diffuse body wall edema. Musculoskeletal: Stable findings of renal osteodystrophy. There is a new rim enhancing fluid collection along the inferior margin of the left ischium, measuring up to 3.3 x 3.1 x 3.5 cm reference image 97/3. No evidence of underlying bony abnormality. No acute fractures. Reconstructed images demonstrate no additional findings. Review of the MIP images confirms the above findings. IMPRESSION: Chest: 1. No evidence of pulmonary embolus. 2. Bibasilar hypoventilatory changes.  No acute airspace disease. 3. Aortic Atherosclerosis (ICD10-I70.0) and Emphysema (  ICD10-J43.9). Abdomen/pelvis: 1. New 3.5 cm rim enhancing fluid collection within the soft tissues adjacent to the inferior margin of the left ischium, suspicious for soft tissue abscess. No evidence of underlying osteomyelitis or signs of overlying soft tissue ulceration. 2. Moderate retained stool within the rectal vault, consistent with fecal impaction. Mild rectal wall thickening and perirectal fat stranding could reflect superimposed stercoral colitis. 3. Stable indwelling cholecystostomy tube, with decompression of the gallbladder. Stable calcified gallstones noted. 4. Findings consistent with known end-stage renal disease, with diffuse renal osteodystrophy again noted. 5.  Aortic Atherosclerosis (ICD10-I70.0). 6. 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  Electronically Signed   By: Sharlet Salina M.D.   On: 03/29/2023 15:38   DG Chest Port 1 View  Result Date: 03/29/2023 CLINICAL DATA:  Hypertension.  Headache. EXAM: PORTABLE CHEST 1 VIEW COMPARISON:  November 08, 2022. FINDINGS: Stable cardiomediastinal silhouette. Right internal jugular dialysis catheter is in grossly good position. Minimal left basilar subsegmental atelectasis is noted. The visualized skeletal structures are unremarkable. IMPRESSION: Minimal left basilar subsegmental atelectasis. Aortic Atherosclerosis (ICD10-I70.0). Electronically Signed   By: Lupita Raider M.D.   On: 03/29/2023 09:34   IR EXCHANGE BILIARY DRAIN  Result Date: 03/16/2023 INDICATION: Chronic cholecystostomy exchange non operative candidate EXAM: IR CATHETER TUBE CHANGE MEDICATIONS: 1% lidocaine local ANESTHESIA/SEDATION: None. FLUOROSCOPY TIME:  Fluoroscopy Time: 0 minutes 36 seconds (15 mGy). COMPLICATIONS: None immediate. PROCEDURE: Informed written consent was obtained from the patient after a thorough discussion of the procedural risks, benefits and alternatives. All questions were addressed. Maximal Sterile Barrier Technique was utilized including caps, mask, sterile gowns, sterile gloves, sterile drape, hand hygiene and skin antiseptic. A timeout was performed prior to the initiation of the procedure. Initially, the existing cholecystostomy was injected with contrast confirming position in the gallbladder. Catheter successfully exchanged over an Amplatz guidewire for a new 18 French drain. Position confirmed with fluoroscopy and contrast injection. Images obtained for documentation. Catheter secured with a silk suture and a sterile dressing. Gravity drainage bag connected. Gallbladder decompressed by syringe aspiration. No immediate complication. Patient tolerated the procedure well. IMPRESSION: Successful fluoroscopic cholecystostomy exchange Electronically Signed   By: Judie Petit.  Shick M.D.   On: 03/16/2023 14:51       Subjective: Patient seen and examined at bedside along with cardiology.  His heart rate is better than yesterday.  Systolic blood pressure low but has been same since last few days and is asymptomatic.  Denies chest pain or shortness of breath or lightheadedness or dizziness.  We discussed about discharge planning.  I called and discussed the discharge planning with niece [name] on phone  Discharge Exam: Vitals:   04/08/23 0845 04/08/23 1020  BP:  (!) 82/56  Pulse:    Resp: (!) 24 13  Temp:    SpO2:     Vitals:   04/08/23 0400 04/08/23 0753 04/08/23 0845 04/08/23 1020  BP: (!) 97/56 (!) 102/53  (!) 82/56  Pulse: 70 75    Resp: 14 16 (!) 24 13  Temp: 98.1 F (36.7 C) 98.1 F (36.7 C)    TempSrc: Oral Oral    SpO2: 99% 96%    Weight: 84.6 kg     Height:        General: Pt is alert, awake, not in acute distress, deconditioned Cardiovascular: RRR, S1/S2 +,  no rubs, no gallops, dialysis catheter on the right chest Respiratory: CTA bilaterally, no wheezing, no rhonchi Abdominal: Soft, NT, ND, bowel sounds + Extremities: no edema, no cyanosis, bilateral lower extremity weakness    The results of significant diagnostics from this hospitalization (including imaging, microbiology, ancillary and laboratory) are listed below for reference.     Microbiology: Recent Results (from the past 240 hour(s))  MRSA Next Gen by PCR, Nasal     Status: None   Collection Time: 03/29/23  7:25 PM   Specimen: Nasal Mucosa; Nasal Swab  Result Value Ref Range Status   MRSA by PCR Next Gen NOT DETECTED NOT DETECTED Final    Comment: (NOTE) The GeneXpert MRSA Assay (FDA approved for NASAL specimens only), is one component of a comprehensive MRSA colonization surveillance program. It is not intended to diagnose MRSA infection nor to guide or monitor treatment for MRSA infections. Test performance is not FDA approved in patients less than 28 years old. Performed at [facility],  [address], [place], [place] [address]   Blood Culture (routine x 2)     Status: None   Collection Time: 03/29/23  9:20 PM   Specimen: BLOOD RIGHT HAND  Result Value Ref Range Status   Specimen Description BLOOD RIGHT HAND  Final   Special Requests   Final    BOTTLES DRAWN AEROBIC AND ANAEROBIC Blood Culture adequate volume   Culture   Final    NO GROWTH 5 DAYS Performed at [facility], [address] [place], [place] [address]    Report Status 04/03/2023 FINAL  Final  Aerobic/Anaerobic Culture w Gram Stain (surgical/deep wound)     Status: None   Collection Time: 03/30/23  8:19 AM   Specimen: Abscess  Result Value Ref Range Status   Specimen Description ABSCESS  Final   Special Requests LEFT ISCHIAL  Final   Gram Stain   Final    RARE WBC PRESENT, PREDOMINANTLY PMN NO ORGANISMS SEEN    Culture   Final    No growth aerobically or anaerobically. Performed at [facility], [address] [place], [place] [address]    Report Status 04/04/2023 FINAL  Final     Labs: BNP (last 3 results) Recent Labs    11/09/22 0355  BNP 1,895.9*   Basic Metabolic Panel: Recent Labs  Lab 04/02/23 0304 04/03/23 1547 04/05/23 1646 04/07/23 1024  NA 134*  --  138 137  K 3.5  --  3.6 3.4*  CL 96*  --  103 100  CO2 24  --  23 28  GLUCOSE 74  --  140* 127*  BUN 32*  --  46* 34*  CREATININE 7.77*  --  8.73* 7.20*  CALCIUM 7.7*  --  7.7* 8.0*  PHOS  --  4.9* 4.4 3.3   Liver Function Tests: Recent Labs  Lab 04/05/23 1646 04/07/23 1024  ALBUMIN 2.1* 2.0*   No results for input(s): "LIPASE", "AMYLASE" in the last 168 hours. No results for input(s): "AMMONIA" in the last 168 hours. CBC: Recent Labs  Lab 04/02/23 0304 04/05/23 1646 04/07/23 1024  WBC 6.7 8.9 8.3  HGB 10.3* 9.8* 9.4*  HCT 33.3* 31.9* 30.1*  MCV 97.4 100.9* 97.7  PLT 180 186 170   Cardiac Enzymes: No results for input(s): "CKTOTAL", "CKMB", "CKMBINDEX", "TROPONINI" in the last 168  hours. BNP: Invalid input(s): "POCBNP" CBG: Recent Labs  Lab 04/02/23 2033 04/04/23 0801  GLUCAP 103* 71  D-Dimer No results for input(s): "DDIMER" in the last 72 hours. Hgb A1c No results for input(s): "HGBA1C" in the last 72 hours. Lipid Profile No results for input(s): "CHOL", "HDL", "LDLCALC", "TRIG", "CHOLHDL", "LDLDIRECT" in the last 72 hours. Thyroid function studies No results for input(s): "TSH", "T4TOTAL", "T3FREE", "THYROIDAB" in the last 72 hours.  Invalid input(s): "FREET3" Anemia work up No results for input(s): "VITAMINB12", "FOLATE", "FERRITIN", "TIBC", "IRON", "RETICCTPCT" in the last 72 hours. Urinalysis    Component Value Date/Time   COLORURINE AMBER (A) 05/27/2020 1451   APPEARANCEUR CLOUDY (A) 05/27/2020 1451   LABSPEC 1.017 05/27/2020 1451   PHURINE 6.0 05/27/2020 1451   GLUCOSEU 50 (A) 05/27/2020 1451   HGBUR LARGE (A) 05/27/2020 1451   BILIRUBINUR NEGATIVE 05/27/2020 1451   KETONESUR NEGATIVE 05/27/2020 1451   PROTEINUR 100 (A) 05/27/2020 1451   UROBILINOGEN 0.2 08/02/2010 1534   NITRITE NEGATIVE 05/27/2020 1451   LEUKOCYTESUR SMALL (A) 05/27/2020 1451   Sepsis Labs Recent Labs  Lab 04/02/23 0304 04/05/23 1646 04/07/23 1024  WBC 6.7 8.9 8.3   Microbiology Recent Results (from the past 240 hour(s))  MRSA Next Gen by PCR, Nasal     Status: None   Collection Time: 03/29/23  7:25 PM   Specimen: Nasal Mucosa; Nasal Swab  Result Value Ref Range Status   MRSA by PCR Next Gen NOT DETECTED NOT DETECTED Final    Comment: (NOTE) The GeneXpert MRSA Assay (FDA approved for NASAL specimens only), is one component of a comprehensive MRSA colonization surveillance program. It is not intended to diagnose MRSA infection nor to guide or monitor treatment for MRSA infections. Test performance is not FDA approved in patients less than 32 years old. Performed at [facility], [address] [place], [place] [address]   Blood Culture (routine x  2)     Status: None   Collection Time: 03/29/23  9:20 PM   Specimen: BLOOD RIGHT HAND  Result Value Ref Range Status   Specimen Description BLOOD RIGHT HAND  Final   Special Requests   Final    BOTTLES DRAWN AEROBIC AND ANAEROBIC Blood Culture adequate volume   Culture   Final    NO GROWTH 5 DAYS Performed at [facility], [address], [place], [place] [address]    Report Status 04/03/2023 FINAL  Final  Aerobic/Anaerobic Culture w Gram Stain (surgical/deep wound)     Status: None   Collection Time: 03/30/23  8:19 AM   Specimen: Abscess  Result Value Ref Range Status   Specimen Description ABSCESS  Final   Special Requests LEFT ISCHIAL  Final   Gram Stain   Final    RARE WBC PRESENT, PREDOMINANTLY PMN NO ORGANISMS SEEN    Culture   Final    No growth aerobically or anaerobically. Performed at [facility], [address], [place], [place] [address]    Report Status 04/04/2023 FINAL  Final    Please note: You were cared for by a hospitalist during your hospital stay. Once you are discharged, your primary care physician will handle any further medical issues. Please note that NO REFILLS for any discharge medications will be authorized once you are discharged, as it is imperative that you return to your primary care physician (or establish a relationship with a primary care physician if you do not have one) for your post hospital discharge needs so that they can reassess your need for medications and monitor your lab values.  Time coordinating discharge: 40 minutes  SIGNED:   Burnadette Pop, MD  [facility] 04/08/2023, 11:08 AM Pager [phone]  If 7PM-7AM, please contact night-coverage [email or web address] Password [email or web address]

## 2023-04-09 ENCOUNTER — Telehealth: Payer: Self-pay | Admitting: Nurse Practitioner

## 2023-04-09 NOTE — Telephone Encounter (Signed)
Transition of Care - Initial Contact from Inpatient Facility  Date of discharge: 04/08/2023 Date of contact: 04/09/2023  Method: Phone Spoke to: Patient  Patient contacted to discuss transition of care from recent inpatient hospitalization. Patient was admitted to [facility] from 03/29/2023-04/08/2023 with discharge diagnosis of hypotension from sepsis.   The discharge medication list was reviewed. Patient understands the changes and has no concerns. Patient says he feels fine.   Patient will return to his/her outpatient HD unit on: 04/09/2023  No other concerns at this time.

## 2023-04-12 ENCOUNTER — Emergency Department (HOSPITAL_COMMUNITY): Payer: Medicare Other

## 2023-04-12 ENCOUNTER — Encounter (HOSPITAL_COMMUNITY): Payer: Self-pay

## 2023-04-12 ENCOUNTER — Other Ambulatory Visit: Payer: Self-pay

## 2023-04-12 ENCOUNTER — Inpatient Hospital Stay (HOSPITAL_COMMUNITY)
Admission: EM | Admit: 2023-04-12 | Discharge: 2023-04-21 | DRG: 871 | Disposition: A | Discharge status: Skilled Nursing Facility | Payer: Medicare Other | Attending: Family Medicine | Admitting: Family Medicine

## 2023-04-12 DIAGNOSIS — A4159 Other Gram-negative sepsis: Secondary | ICD-10-CM | POA: Diagnosis not present

## 2023-04-12 DIAGNOSIS — R262 Difficulty in walking, not elsewhere classified: Secondary | ICD-10-CM | POA: Diagnosis present

## 2023-04-12 DIAGNOSIS — Z5941 Food insecurity: Secondary | ICD-10-CM

## 2023-04-12 DIAGNOSIS — I120 Hypertensive chronic kidney disease with stage 5 chronic kidney disease or end stage renal disease: Secondary | ICD-10-CM | POA: Diagnosis present

## 2023-04-12 DIAGNOSIS — K6139 Other ischiorectal abscess: Secondary | ICD-10-CM | POA: Diagnosis present

## 2023-04-12 DIAGNOSIS — Z841 Family history of disorders of kidney and ureter: Secondary | ICD-10-CM

## 2023-04-12 DIAGNOSIS — M51369 Other intervertebral disc degeneration, lumbar region without mention of lumbar back pain or lower extremity pain: Secondary | ICD-10-CM | POA: Diagnosis present

## 2023-04-12 DIAGNOSIS — R6510 Systemic inflammatory response syndrome (SIRS) of non-infectious origin without acute organ dysfunction: Secondary | ICD-10-CM | POA: Diagnosis present

## 2023-04-12 DIAGNOSIS — L89629 Pressure ulcer of left heel, unspecified stage: Secondary | ICD-10-CM | POA: Diagnosis present

## 2023-04-12 DIAGNOSIS — D631 Anemia in chronic kidney disease: Secondary | ICD-10-CM | POA: Diagnosis present

## 2023-04-12 DIAGNOSIS — M5106 Intervertebral disc disorders with myelopathy, lumbar region: Secondary | ICD-10-CM

## 2023-04-12 DIAGNOSIS — M8618 Other acute osteomyelitis, other site: Secondary | ICD-10-CM | POA: Diagnosis present

## 2023-04-12 DIAGNOSIS — Z811 Family history of alcohol abuse and dependence: Secondary | ICD-10-CM

## 2023-04-12 DIAGNOSIS — I480 Paroxysmal atrial fibrillation: Secondary | ICD-10-CM | POA: Diagnosis present

## 2023-04-12 DIAGNOSIS — E1122 Type 2 diabetes mellitus with diabetic chronic kidney disease: Secondary | ICD-10-CM | POA: Diagnosis present

## 2023-04-12 DIAGNOSIS — E1169 Type 2 diabetes mellitus with other specified complication: Secondary | ICD-10-CM | POA: Diagnosis present

## 2023-04-12 DIAGNOSIS — Z6372 Alcoholism and drug addiction in family: Secondary | ICD-10-CM

## 2023-04-12 DIAGNOSIS — L89619 Pressure ulcer of right heel, unspecified stage: Secondary | ICD-10-CM | POA: Diagnosis present

## 2023-04-12 DIAGNOSIS — Z8719 Personal history of other diseases of the digestive system: Secondary | ICD-10-CM

## 2023-04-12 DIAGNOSIS — Z992 Dependence on renal dialysis: Secondary | ICD-10-CM

## 2023-04-12 DIAGNOSIS — E11311 Type 2 diabetes mellitus with unspecified diabetic retinopathy with macular edema: Secondary | ICD-10-CM | POA: Diagnosis present

## 2023-04-12 DIAGNOSIS — R5381 Other malaise: Secondary | ICD-10-CM | POA: Diagnosis present

## 2023-04-12 DIAGNOSIS — Y848 Other medical procedures as the cause of abnormal reaction of the patient, or of later complication, without mention of misadventure at the time of the procedure: Secondary | ICD-10-CM | POA: Diagnosis not present

## 2023-04-12 DIAGNOSIS — J189 Pneumonia, unspecified organism: Secondary | ICD-10-CM | POA: Diagnosis present

## 2023-04-12 DIAGNOSIS — N186 End stage renal disease: Secondary | ICD-10-CM

## 2023-04-12 DIAGNOSIS — Z5982 Transportation insecurity: Secondary | ICD-10-CM

## 2023-04-12 DIAGNOSIS — R29898 Other symptoms and signs involving the musculoskeletal system: Principal | ICD-10-CM

## 2023-04-12 DIAGNOSIS — T83030A Leakage of cystostomy catheter, initial encounter: Secondary | ICD-10-CM | POA: Diagnosis not present

## 2023-04-12 DIAGNOSIS — M25552 Pain in left hip: Secondary | ICD-10-CM | POA: Diagnosis present

## 2023-04-12 DIAGNOSIS — Z7901 Long term (current) use of anticoagulants: Secondary | ICD-10-CM

## 2023-04-12 DIAGNOSIS — Z79899 Other long term (current) drug therapy: Secondary | ICD-10-CM

## 2023-04-12 DIAGNOSIS — R1310 Dysphagia, unspecified: Secondary | ICD-10-CM

## 2023-04-12 DIAGNOSIS — E11649 Type 2 diabetes mellitus with hypoglycemia without coma: Secondary | ICD-10-CM | POA: Diagnosis not present

## 2023-04-12 DIAGNOSIS — I9589 Other hypotension: Secondary | ICD-10-CM | POA: Diagnosis present

## 2023-04-12 DIAGNOSIS — Z993 Dependence on wheelchair: Secondary | ICD-10-CM

## 2023-04-12 DIAGNOSIS — M5430 Sciatica, unspecified side: Secondary | ICD-10-CM | POA: Diagnosis present

## 2023-04-12 DIAGNOSIS — Z87891 Personal history of nicotine dependence: Secondary | ICD-10-CM

## 2023-04-12 DIAGNOSIS — N250 Renal osteodystrophy: Secondary | ICD-10-CM | POA: Diagnosis present

## 2023-04-12 DIAGNOSIS — I82C23 Chronic embolism and thrombosis of internal jugular vein, bilateral: Secondary | ICD-10-CM | POA: Diagnosis present

## 2023-04-12 DIAGNOSIS — Z66 Do not resuscitate: Secondary | ICD-10-CM | POA: Diagnosis present

## 2023-04-12 LAB — BASIC METABOLIC PANEL
Anion gap: 16 — ABNORMAL HIGH (ref 5–15)
BUN: 45 mg/dL — ABNORMAL HIGH (ref 8–23)
CO2: 23 mmol/L (ref 22–32)
Calcium: 9.1 mg/dL (ref 8.9–10.3)
Chloride: 100 mmol/L (ref 98–111)
Creatinine, Ser: 8.57 mg/dL — ABNORMAL HIGH (ref 0.61–1.24)
GFR, Estimated: 6 mL/min — ABNORMAL LOW (ref >60)
Glucose, Bld: 89 mg/dL (ref 70–99)
Potassium: 4.2 mmol/L (ref 3.5–5.1)
Sodium: 139 mmol/L (ref 135–145)

## 2023-04-12 LAB — CBC
HCT: 31.9 % — ABNORMAL LOW (ref 39.0–52.0)
Hemoglobin: 10.2 g/dL — ABNORMAL LOW (ref 13.0–17.0)
MCH: 30.9 pg (ref 26.0–34.0)
MCHC: 32 g/dL (ref 30.0–36.0)
MCV: 96.7 fL (ref 80.0–100.0)
Platelets: 204 10*3/uL (ref 150–400)
RBC: 3.3 MIL/uL — ABNORMAL LOW (ref 4.22–5.81)
RDW: 15.9 % — ABNORMAL HIGH (ref 11.5–15.5)
WBC: 16.9 10*3/uL — ABNORMAL HIGH (ref 4.0–10.5)
nRBC: 0 % (ref 0.0–0.2)

## 2023-04-12 LAB — CBG MONITORING, ED: Glucose-Capillary: 87 mg/dL (ref 70–99)

## 2023-04-12 LAB — SEDIMENTATION RATE: Sed Rate: 65 mm/h — ABNORMAL HIGH (ref 0–16)

## 2023-04-12 LAB — C-REACTIVE PROTEIN: CRP: 14.3 mg/dL — ABNORMAL HIGH (ref <1.0)

## 2023-04-12 MED ORDER — SODIUM CHLORIDE 0.9 % IV SOLN
2.0000 g | Freq: Once | INTRAVENOUS | Status: AC
  Administered 2023-04-12: 2 g via INTRAVENOUS
  Filled 2023-04-12: qty 12.5

## 2023-04-12 MED ORDER — LORAZEPAM 2 MG/ML IJ SOLN: 1.0000 mg | INTRAMUSCULAR | Status: DC | PRN

## 2023-04-12 MED ORDER — VANCOMYCIN HCL 1750 MG/350ML IV SOLN
1750.0000 mg | Freq: Once | INTRAVENOUS | Status: AC
  Administered 2023-04-13: 1750 mg via INTRAVENOUS
  Filled 2023-04-12: qty 350

## 2023-04-12 MED ORDER — LORAZEPAM 0.5 MG PO TABS: 0.5000 mg | ORAL_TABLET | ORAL | Status: DC | PRN

## 2023-04-12 NOTE — ED Provider Triage Note (Signed)
Emergency Medicine Provider Triage Evaluation Note  Justin [name] , a 77 y.o. male  was evaluated in triage.  Pt complains of leg weakness.  The patient states that he has had chronic weakness in his left lower extremity over the past year that worsened over the past few days.  He states that he could not get up and bathe himself due to an inability to completely move his left leg.  He denies any recent falls or trauma.  He denies any back pain, fevers or chills..  Review of Systems  Positive: Weakness Negative: Back pain, fever, facial droop  Physical Exam  BP 112/73 (BP Location: Right Arm)   Pulse (!) 114   Temp 98 F (36.7 C) (Oral)   Resp 14   SpO2 97%  Gen:   Awake, no distress   Resp:  Normal effort  MSK:   Moves extremities without difficulty  Neuro:  Nerves II through XII intact, 5/5 strength in the bilateral upper extremities, 2 of 5 strength in the left lower extremity, 5 out of 5 strength in the right lower extremity  Medical Decision Making  Medically screening exam initiated at 6:41 PM.  Appropriate orders placed.  Justin [name] was informed that the remainder of the evaluation will be completed by another provider, this initial triage assessment does not replace that evaluation, and the importance of remaining in the ED until their evaluation is complete.  Differential diagnosis includes stroke versus myelopathy from the lumbar spine.  Will CT imaging ordered in addition to MRI imaging of the brain and lumbar spine.   Ernie Avena, MD 04/12/23 [phone]

## 2023-04-12 NOTE — ED Notes (Signed)
Patient transported to MRI 

## 2023-04-12 NOTE — ED Provider Notes (Signed)
[facility]GENC[facility] DEPARTMENT AT [facility] Provider Note   CSN: [id number] Arrival date & time: 04/12/23  1827     History  Chief Complaint  Patient presents with   Leg Pain    Justin [name] is a 77 y.o. male.   Leg Pain    77 year old male with medical history significant for ESRD on hemodialysis, GI bleeding, rectal abscess, cholecystitis with percutaneous drain in place, diabetes mellitus, atrial fibrillation on Eliquis, recent admission to the hospital from 9/16 to 9/26 secondary to concern for septic shock from a likely right pelvic abscess that was subsequently drained by IR and treated with IV antibiotics who presents to the emergency department with complete inability to move his left leg.  The patient denies any fevers or chills.  He is not currently on any antibiotics.  He states that he has been unable to ambulate and unable to move his left leg.  He denies any new fecal incontinence.  He has had chronic weakness in the left leg for the past year but states that it acutely worsened today.  He denies any falls or trauma.  He denies any saddle anesthesia.  He missed his Monday session of hemodialysis today due to his presentation to the emergency department and his last dialysis session was on Friday.  He states that he was last normal last night and when he woke up he was unable to move his leg.  Home Medications Prior to Admission medications   Medication Sig Start Date End Date Taking? Authorizing Provider  acetaminophen (TYLENOL) 325 MG tablet Take 2 tablets (650 mg total) by mouth every 6 (six) hours as needed for mild pain (or Fever >/= 101). 11/24/22  Yes Pokhrel, Laxman, MD  amiodarone (PACERONE) 200 MG tablet Take 1 tablet (200 mg total) by mouth daily. 04/09/23  Yes Burnadette Pop, MD  apixaban (ELIQUIS) 2.5 MG TABS tablet Take 1 tablet (2.5 mg total) by mouth 2 (two) times daily. 04/08/23  Yes Burnadette Pop, MD  atorvastatin (LIPITOR) 40 MG tablet Take  40 mg by mouth at bedtime. 10/30/14  Yes [provider]  calcitRIOL (ROCALTROL) 0.25 MCG capsule Take 3 capsules (0.75 mcg total) by mouth every Monday, Wednesday, and Friday with hemodialysis. Patient taking differently: Take 0.25 mcg by mouth daily. 11/25/22  Yes Pokhrel, Laxman, MD  Digoxin 62.5 MCG TABS Take 1 tablet by mouth every other day. 04/10/23  Yes [name], MD  midodrine (PROAMATINE) 10 MG tablet Take 2 tablets (20 mg total) by mouth 3 (three) times daily. 04/08/23  Yes Burnadette Pop, MD  pantoprazole (PROTONIX) 40 MG tablet Take 1 tablet (40 mg total) by mouth 2 (two) times daily before a meal. Patient taking differently: Take 40 mg by mouth daily. 11/24/22  Yes Pokhrel, Laxman, MD  pregabalin (LYRICA) 75 MG capsule Take 1 capsule (75 mg total) by mouth at bedtime. Patient taking differently: Take 75 mg by mouth 2 (two) times daily. 11/24/22 11/24/23 Yes Pokhrel, Laxman, MD  b complex-vitamin c-folic acid (NEPHRO-VITE) 0.8 MG TABS tablet Take 1 tablet by mouth at bedtime. Patient not taking: Reported on 03/29/2023    [provider]  cinacalcet (SENSIPAR) 30 MG tablet Take 30 mg by mouth daily. Patient not taking: Reported on 04/13/2023 08/25/22   [provider]  sevelamer carbonate (RENVELA) 800 MG tablet Take 800 mg by mouth 2 (two) times daily. Patient not taking: Reported on 03/29/2023    [provider]  traZODone (DESYREL) 50 MG  tablet Take 1 tablet (50 mg total) by mouth at bedtime as needed for sleep. Patient not taking: Reported on 03/29/2023 11/24/22   Joycelyn Das, MD      Allergies    Patient has no known allergies.    Review of Systems   Review of Systems  Neurological:  Positive for weakness.  All other systems reviewed and are negative.   Physical Exam Updated Vital Signs BP (!) 130/96   Pulse (!) 101   Temp 98 F (36.7 C) (Oral)   Resp 15   Ht 5\' 10"  (1.778 m)   Wt 81.2 kg   SpO2 97%   BMI 25.68 kg/m  Physical  Exam Vitals and nursing note reviewed.  Constitutional:      General: He is not in acute distress.    Appearance: He is well-developed.  HENT:     Head: Normocephalic and atraumatic.  Eyes:     Conjunctiva/sclera: Conjunctivae normal.  Cardiovascular:     Rate and Rhythm: Regular rhythm. Tachycardia present.  Pulmonary:     Effort: Pulmonary effort is normal. No respiratory distress.     Breath sounds: Normal breath sounds.  Abdominal:     Palpations: Abdomen is soft.     Tenderness: There is no abdominal tenderness.     Comments: Cholecystostomy bag in place draining clear yellow fluid, no abdominal tenderness  Musculoskeletal:        General: No swelling.     Cervical back: Neck supple.  Skin:    General: Skin is warm and dry.     Capillary Refill: Capillary refill takes less than 2 seconds.  Neurological:     Mental Status: He is alert.     Comments: MENTAL STATUS EXAM:    Orientation: Alert and oriented to person, place and time.  Memory: Cooperative, follows commands well.  Language: Speech is clear and language is normal.   CRANIAL NERVES:    CN 2 (Optic): Visual fields intact to confrontation.  CN 3,4,6 (EOM): Pupils equal and reactive to light. Full extraocular eye movement without nystagmus.  CN 5 (Trigeminal): Facial sensation is normal, no weakness of masticatory muscles.  CN 7 (Facial): No facial weakness or asymmetry.  CN 8 (Auditory): Auditory acuity grossly normal.  CN 9,10 (Glossophar): The uvula is midline, the palate elevates symmetrically.  CN 11 (spinal access): Normal sternocleidomastoid and trapezius strength.  CN 12 (Hypoglossal): The tongue is midline. No atrophy or fasciculations.Marland Kitchen   MOTOR:  Muscle Strength: 5/5RUE, 5/5LUE, 5/5RLE, 2/5LLE.   COORDINATION:   Intact finger-to-nose, no tremor.   SENSATION:   Intact to light touch all four extremities, diminished in the LLE.  GAIT unable to assess   Psychiatric:        Mood and Affect: Mood  normal.     ED Results / Procedures / Treatments   Labs (all labs ordered are listed, but only abnormal results are displayed) Labs Reviewed  C-REACTIVE PROTEIN - Abnormal; Notable for the following components:      Result Value   CRP 14.3 (*)    All other components within normal limits  SEDIMENTATION RATE - Abnormal; Notable for the following components:   Sed Rate 65 (*)    All other components within normal limits  BASIC METABOLIC PANEL - Abnormal; Notable for the following components:   BUN 45 (*)    Creatinine, Ser 8.57 (*)    GFR, Estimated 6 (*)    Anion gap 16 (*)    All other  components within normal limits  CBC - Abnormal; Notable for the following components:   WBC 16.9 (*)    RBC 3.30 (*)    Hemoglobin 10.2 (*)    HCT 31.9 (*)    RDW 15.9 (*)    All other components within normal limits  CULTURE, BLOOD (ROUTINE X 2)  CULTURE, BLOOD (ROUTINE X 2)  URINALYSIS, W/ REFLEX TO CULTURE (INFECTION SUSPECTED)  CBG MONITORING, ED    EKG EKG Interpretation Date/Time:  Monday April 12 2023 18:18:43 EDT Ventricular Rate:  118 PR Interval:    QRS Duration:  76 QT Interval:  222 QTC Calculation: 311 R Axis:   96  Text Interpretation: Sinus tachycardia Low voltage QRS Lateral infarct , age undetermined ST & T wave abnormality, consider inferior ischemia Abnormal ECG No significant change since last tracing Confirmed by Ernie Avena ([id number]) on 04/12/2023 9:10:35 PM  Radiology MR LUMBAR SPINE WO CONTRAST  Result Date: 04/12/2023 CLINICAL DATA:  Acute myelopathy EXAM: MRI THORACIC AND LUMBAR SPINE WITHOUT CONTRAST TECHNIQUE: Multiplanar and multiecho pulse sequences of the thoracic and lumbar spine were obtained without intravenous contrast. COMPARISON:  None Available. FINDINGS: MRI THORACIC SPINE FINDINGS Alignment:  Physiologic. Vertebrae: The heterogeneous bone marrow signal without discrete lesion. There are Modic type 2 endplate signal changes at T8-9, T10-11 and  T11-12. No acute fracture. Cord:  Normal signal and morphology. Paraspinal and other soft tissues: Small pleural effusions Disc levels: There is no spinal canal stenosis or neural impingement. No thoracic disc herniation. MRI LUMBAR SPINE FINDINGS Segmentation:  Standard. Alignment:  Grade 1 anterolisthesis at L3-4 Vertebrae: No fracture, evidence of discitis, or bone lesion. There are type 2 Modic signal changes at all levels. Schmorl's nodes are most prominent at L1 L2 and L3. There is mild degenerative height loss at L4. Conus medullaris and cauda equina: Conus extends to the L1 level. Conus and cauda equina appear normal. Paraspinal and other soft tissues: Negative. Disc levels: L1-L2: Normal disc space and facet joints. No spinal canal stenosis. No neural foraminal stenosis. L2-L3: Small disc bulge and mild facet hypertrophy. No spinal canal stenosis. No neural foraminal stenosis. L3-L4: Large disc bulge and moderate facet hypertrophy. Moderate spinal canal stenosis. Severe bilateral neural foraminal stenosis. L4-L5: Intermediate sized disc bulge and mild facet hypertrophy. No spinal canal stenosis. Mild right neural foraminal stenosis. L5-S1: Intermediate sized disc bulge with large right extraforaminal osteophyte. No spinal canal stenosis. No neural foraminal stenosis. Visualized sacrum: Normal. IMPRESSION: 1. No acute abnormality of the thoracic or lumbar spine. 2. Moderate spinal canal stenosis and severe bilateral neural foraminal stenosis at L3-L4 due to combination of disc bulge and facet arthrosis. 3. Mild right L4-5 neural foraminal stenosis. 4. Small pleural effusions. 5. Widespread degenerative disc disease with multilevel type 2 Modic signal change. Electronically Signed   By: Deatra Robinson M.D.   On: 04/12/2023 23:51   MR THORACIC SPINE WO CONTRAST  Result Date: 04/12/2023 CLINICAL DATA:  Acute myelopathy EXAM: MRI THORACIC AND LUMBAR SPINE WITHOUT CONTRAST TECHNIQUE: Multiplanar and multiecho  pulse sequences of the thoracic and lumbar spine were obtained without intravenous contrast. COMPARISON:  None Available. FINDINGS: MRI THORACIC SPINE FINDINGS Alignment:  Physiologic. Vertebrae: The heterogeneous bone marrow signal without discrete lesion. There are Modic type 2 endplate signal changes at T8-9, T10-11 and T11-12. No acute fracture. Cord:  Normal signal and morphology. Paraspinal and other soft tissues: Small pleural effusions Disc levels: There is no spinal canal stenosis or neural impingement. No thoracic disc  herniation. MRI LUMBAR SPINE FINDINGS Segmentation:  Standard. Alignment:  Grade 1 anterolisthesis at L3-4 Vertebrae: No fracture, evidence of discitis, or bone lesion. There are type 2 Modic signal changes at all levels. Schmorl's nodes are most prominent at L1 L2 and L3. There is mild degenerative height loss at L4. Conus medullaris and cauda equina: Conus extends to the L1 level. Conus and cauda equina appear normal. Paraspinal and other soft tissues: Negative. Disc levels: L1-L2: Normal disc space and facet joints. No spinal canal stenosis. No neural foraminal stenosis. L2-L3: Small disc bulge and mild facet hypertrophy. No spinal canal stenosis. No neural foraminal stenosis. L3-L4: Large disc bulge and moderate facet hypertrophy. Moderate spinal canal stenosis. Severe bilateral neural foraminal stenosis. L4-L5: Intermediate sized disc bulge and mild facet hypertrophy. No spinal canal stenosis. Mild right neural foraminal stenosis. L5-S1: Intermediate sized disc bulge with large right extraforaminal osteophyte. No spinal canal stenosis. No neural foraminal stenosis. Visualized sacrum: Normal. IMPRESSION: 1. No acute abnormality of the thoracic or lumbar spine. 2. Moderate spinal canal stenosis and severe bilateral neural foraminal stenosis at L3-L4 due to combination of disc bulge and facet arthrosis. 3. Mild right L4-5 neural foraminal stenosis. 4. Small pleural effusions. 5. Widespread  degenerative disc disease with multilevel type 2 Modic signal change. Electronically Signed   By: Deatra Robinson M.D.   On: 04/12/2023 23:51   CT HEAD WO CONTRAST ( )  Result Date: 04/12/2023 CLINICAL DATA:  Leg pain and weakness, initial encounter EXAM: CT HEAD WITHOUT CONTRAST TECHNIQUE: Contiguous axial images were obtained from the base of the skull through the vertex without intravenous contrast. RADIATION DOSE REDUCTION: This exam was performed according to the departmental dose-optimization program which includes automated exposure control, adjustment of the mA and/or kV according to patient size and/or use of iterative reconstruction technique. COMPARISON:  11/09/2022 FINDINGS: Brain: No evidence of acute infarction, hemorrhage, hydrocephalus, extra-axial collection or mass lesion/mass effect. Vascular: No hyperdense vessel or unexpected calcification. Skull: Normal. Negative for fracture or focal lesion. Sinuses/Orbits: No acute finding. Other: None. IMPRESSION: No acute abnormality noted. Electronically Signed   By: Alcide Clever M.D.   On: 04/12/2023 23:38   [name] CONTRAST  Result Date: 04/12/2023 CLINICAL DATA:  Acute neurologic deficit.  Left leg weakness. EXAM: MRI HEAD WITHOUT CONTRAST TECHNIQUE: Multiplanar, multiecho pulse sequences of the brain and surrounding structures were obtained without intravenous contrast. COMPARISON:  07/10/2011 FINDINGS: Brain: No acute infarct. No acute hemorrhage or extra-axial collection. Small amount of chronic blood products at the anterior right convexity. There is multifocal hyperintense T2-weighted signal within the periventricular and deep white matter. Mild volume loss. No hydrocephalus. The major midline structures are normal. Vascular: Normal flow voids. Skull and upper cervical spine: Normal marrow signal. Sinuses/Orbits: Negative. Other: None. IMPRESSION: 1. No acute intracranial abnormality. 2. Findings of chronic microvascular ischemia.  Electronically Signed   By: Deatra Robinson M.D.   On: 04/12/2023 23:23   DG Chest Portable 1 View  Result Date: 04/12/2023 CLINICAL DATA:  Pacemaker EXAM: PORTABLE CHEST 1 VIEW COMPARISON:  03/29/2023. FINDINGS: Unremarkable cardiac silhouette. Right-sided hemodialysis catheter tip at the RA/SVC junction. Alveolar opacity left base consistent with an early pneumonia or volume loss. Calcified aorta. No pneumothorax. No pleural effusion. IMPRESSION: Early/mild left base consolidation or volume loss. No pacemaker is seen. Electronically Signed   By: Layla Maw M.D.   On: 04/12/2023 19:40    Procedures Procedures    Medications Ordered in ED Medications  LORazepam (ATIVAN) tablet  0.5 mg (0.5 mg Oral Not Given 04/12/23 2346)  LORazepam (ATIVAN) injection 1 mg (1 mg Intravenous Not Given 04/12/23 2346)  vancomycin (VANCOREADY) IVPB 1750 mg/350 mL (has no administration in time range)  ceFEPIme (MAXIPIME) 2 g in sodium chloride 0.9 % 100 mL IVPB (2 g Intravenous New Bag/Given 04/12/23 2351)    ED Course/ Medical Decision Making/ A&P Clinical Course as of 04/13/23 0027  Mon Apr 12, 2023  2209 WBC(!): 16.9 [JL]  2209 CRP(!): 14.3 [JL]  2209 Sed Rate(!): 65 [JL]  2209 Pulse Rate(!): 114 [JL]    Clinical Course User Index [JL] Ernie Avena, MD                                 Medical Decision Making Amount and/or Complexity of Data Reviewed Labs: ordered. Decision-making details documented in ED Course. Radiology: ordered.  Risk Prescription drug management. Decision regarding hospitalization.    77 year old male with medical history significant for ESRD on hemodialysis, GI bleeding, rectal abscess, cholecystitis with percutaneous drain in place, diabetes mellitus, atrial fibrillation on Eliquis, recent admission to the hospital from 9/16 to 9/26 secondary to concern for septic shock from a likely right pelvic abscess that was subsequently drained by IR and treated with IV  antibiotics who presents to the emergency department with complete inability to move his left leg.  The patient denies any fevers or chills.  He is not currently on any antibiotics.  He states that he has been unable to ambulate and unable to move his left leg.  He denies any new fecal incontinence.  He has had chronic weakness in the left leg for the past year but states that it acutely worsened today.  He denies any falls or trauma.  He denies any saddle anesthesia.  He missed his Monday session of hemodialysis today due to his presentation to the emergency department and his last dialysis session was on Friday.  He states that he was last normal last night and when he woke up he was unable to move his leg.  On arrival, the patient was afebrile, tachycardic heart rate 114, RR 14, BP 112/73, saturating 97% on room air.  Physical exam concerning for left lower extremity weakness and subjective numbness.  Differential diagnosis includes CVA, given the patient's recent infectious presentation, considered epidural abscess, discitis/osteomyelitis.  Lower concern for acute traumatic injury with lower concern for fracture.  Patient not within the window for TNK.  Additionally code stroke not activated on patient arrival as the patient has had chronic left lower extremity weakness which has worsened over the last 24 hours.  IV access was obtained and screening labs were obtained which included CBC with a leukocytosis 16.9, elevated CRP to 14.3, elevated ESR to 65.  Patient meeting SIRS criteria however unclear source of infection at this time.  Patient was notably incontinent of stool and urine in the emergency department.  Due to this, will obtain MRI imaging of the head, thoracic and lumbar spine to further evaluate.  CXR: Possible consolidation in the left lung base which could reflect early pneumonia.  MRI Brain:  IMPRESSION:  1. No acute intracranial abnormality.  2. Findings of chronic microvascular  ischemia.   MRI thoracic and lumbar spine: IMPRESSION:  1. No acute abnormality of the thoracic or lumbar spine.  2. Moderate spinal canal stenosis and severe bilateral neural  foraminal stenosis at L3-L4 due to combination of disc bulge  and  facet arthrosis.  3. Mild right L4-5 neural foraminal stenosis.  4. Small pleural effusions.  5. Widespread degenerative disc disease with multilevel type 2 Modic  signal change.    Plan to admit the patient for further evaluation.  IV antibiotics were ordered, blood cultures were collected prior to antibiotic administration.  Consider developing SIRS in the setting of consolidation seen on chest x-ray imaging.  Additionally, with patient new numbness, no evidence of stroke on MRI imaging, degenerative disc disease seen with likely myelopathy as a result.  Hospitalist medicine consulted for admission for observation, IV antibiotics.  Patient with a normal serum potassium, not overly hypervolemic, does not need emergent dialysis at this time.  Patient would benefit from PT and OT given his new onset weakness.  With MRI findings, patient does not need emergent neurosurgical consultation, patient likely experiencing acute on chronic worsening degenerative disc disease resulting in new onset myelopathy and would benefit from neurosurgical consultation inpatient.  Admitted to medicine in stable condition.     Final Clinical Impression(s) / ED Diagnoses Final diagnoses:  Left leg weakness  Community acquired pneumonia of left lower lobe of lung  Degeneration of intervertebral disc of lumbar region, unspecified whether pain present  Lumbar disc disorder with myelopathy    Rx / DC Orders ED Discharge Orders     None             Ernie Avena, MD 04/13/23 [id number]    Ernie Avena, MD 04/13/23 [phone]

## 2023-04-12 NOTE — ED Triage Notes (Signed)
Pt to ED via [facility] from home. Pt c/o left leg weakness and pain x 1 year but worsening today. Pt usually able to stand and pivot to wheelchair with assistance but unable to do so today. Pt c/o groin pain when attempting to ambulate. Pt denies any injuries. Pt goes to dialysis M,W,F but missed dialysis today d/t pain and weakness. Last treatment was Friday. Pt denies SOB or chest pain.    EMS: 106/62 108 HR 93% RA 103 CBG

## 2023-04-12 NOTE — ED Notes (Signed)
Phlebotomy requested to draw 2nd set of blood cultures.

## 2023-04-12 NOTE — Progress Notes (Signed)
ED Pharmacy Antibiotic Sign Off An antibiotic consult was received from an ED provider for vancomycin and cefepime per pharmacy dosing for sepsis. A chart review was completed to assess appropriateness.   The following one time order(s) were placed:  Vancomycin 1750mg  IV x1 Cefepime 2g IV x1  Further antibiotic and/or antibiotic pharmacy consults should be ordered by the admitting provider if indicated.   Thank you for allowing pharmacy to be a part of this patient's care.   Vernard Gambles, PharmD, BCPS  Clinical Pharmacist 04/12/23 11:46 PM

## 2023-04-12 NOTE — ED Notes (Signed)
Patient cleaned of incontinences. Patient had 1 BM & pull up saturated with urine. Patient clean and dry prior to transport to MRI.

## 2023-04-13 ENCOUNTER — Emergency Department (HOSPITAL_COMMUNITY): Payer: Medicare Other

## 2023-04-13 DIAGNOSIS — R5381 Other malaise: Secondary | ICD-10-CM | POA: Diagnosis present

## 2023-04-13 DIAGNOSIS — I480 Paroxysmal atrial fibrillation: Secondary | ICD-10-CM | POA: Diagnosis present

## 2023-04-13 DIAGNOSIS — M51369 Other intervertebral disc degeneration, lumbar region without mention of lumbar back pain or lower extremity pain: Secondary | ICD-10-CM | POA: Diagnosis present

## 2023-04-13 DIAGNOSIS — R6510 Systemic inflammatory response syndrome (SIRS) of non-infectious origin without acute organ dysfunction: Secondary | ICD-10-CM | POA: Diagnosis not present

## 2023-04-13 DIAGNOSIS — Z66 Do not resuscitate: Secondary | ICD-10-CM | POA: Diagnosis present

## 2023-04-13 DIAGNOSIS — Z992 Dependence on renal dialysis: Secondary | ICD-10-CM | POA: Diagnosis not present

## 2023-04-13 DIAGNOSIS — I9589 Other hypotension: Secondary | ICD-10-CM | POA: Diagnosis present

## 2023-04-13 DIAGNOSIS — L89619 Pressure ulcer of right heel, unspecified stage: Secondary | ICD-10-CM | POA: Diagnosis present

## 2023-04-13 DIAGNOSIS — Z8719 Personal history of other diseases of the digestive system: Secondary | ICD-10-CM

## 2023-04-13 DIAGNOSIS — I120 Hypertensive chronic kidney disease with stage 5 chronic kidney disease or end stage renal disease: Secondary | ICD-10-CM | POA: Diagnosis present

## 2023-04-13 DIAGNOSIS — T83030A Leakage of cystostomy catheter, initial encounter: Secondary | ICD-10-CM | POA: Diagnosis not present

## 2023-04-13 DIAGNOSIS — N186 End stage renal disease: Secondary | ICD-10-CM

## 2023-04-13 DIAGNOSIS — M25552 Pain in left hip: Secondary | ICD-10-CM | POA: Diagnosis present

## 2023-04-13 DIAGNOSIS — N250 Renal osteodystrophy: Secondary | ICD-10-CM | POA: Diagnosis present

## 2023-04-13 DIAGNOSIS — I82C23 Chronic embolism and thrombosis of internal jugular vein, bilateral: Secondary | ICD-10-CM | POA: Diagnosis present

## 2023-04-13 DIAGNOSIS — K6139 Other ischiorectal abscess: Secondary | ICD-10-CM | POA: Diagnosis present

## 2023-04-13 DIAGNOSIS — E11649 Type 2 diabetes mellitus with hypoglycemia without coma: Secondary | ICD-10-CM | POA: Diagnosis not present

## 2023-04-13 DIAGNOSIS — R262 Difficulty in walking, not elsewhere classified: Secondary | ICD-10-CM | POA: Diagnosis present

## 2023-04-13 DIAGNOSIS — D631 Anemia in chronic kidney disease: Secondary | ICD-10-CM | POA: Diagnosis present

## 2023-04-13 DIAGNOSIS — L89629 Pressure ulcer of left heel, unspecified stage: Secondary | ICD-10-CM | POA: Diagnosis present

## 2023-04-13 DIAGNOSIS — E1122 Type 2 diabetes mellitus with diabetic chronic kidney disease: Secondary | ICD-10-CM | POA: Diagnosis present

## 2023-04-13 DIAGNOSIS — R1310 Dysphagia, unspecified: Secondary | ICD-10-CM

## 2023-04-13 DIAGNOSIS — J189 Pneumonia, unspecified organism: Secondary | ICD-10-CM | POA: Diagnosis present

## 2023-04-13 DIAGNOSIS — A4159 Other Gram-negative sepsis: Secondary | ICD-10-CM | POA: Diagnosis present

## 2023-04-13 DIAGNOSIS — M8618 Other acute osteomyelitis, other site: Secondary | ICD-10-CM | POA: Diagnosis present

## 2023-04-13 DIAGNOSIS — E11311 Type 2 diabetes mellitus with unspecified diabetic retinopathy with macular edema: Secondary | ICD-10-CM | POA: Diagnosis present

## 2023-04-13 DIAGNOSIS — Y848 Other medical procedures as the cause of abnormal reaction of the patient, or of later complication, without mention of misadventure at the time of the procedure: Secondary | ICD-10-CM | POA: Diagnosis not present

## 2023-04-13 DIAGNOSIS — M5430 Sciatica, unspecified side: Secondary | ICD-10-CM | POA: Diagnosis present

## 2023-04-13 DIAGNOSIS — R29898 Other symptoms and signs involving the musculoskeletal system: Secondary | ICD-10-CM | POA: Diagnosis present

## 2023-04-13 DIAGNOSIS — E1169 Type 2 diabetes mellitus with other specified complication: Secondary | ICD-10-CM | POA: Diagnosis present

## 2023-04-13 LAB — BASIC METABOLIC PANEL
Anion gap: 14 (ref 5–15)
BUN: 51 mg/dL — ABNORMAL HIGH (ref 8–23)
CO2: 22 mmol/L (ref 22–32)
Calcium: 8.8 mg/dL — ABNORMAL LOW (ref 8.9–10.3)
Chloride: 103 mmol/L (ref 98–111)
Creatinine, Ser: 8.94 mg/dL — ABNORMAL HIGH (ref 0.61–1.24)
GFR, Estimated: 6 mL/min — ABNORMAL LOW (ref >60)
Glucose, Bld: 72 mg/dL (ref 70–99)
Potassium: 4 mmol/L (ref 3.5–5.1)
Sodium: 139 mmol/L (ref 135–145)

## 2023-04-13 LAB — BLOOD CULTURE ID PANEL (REFLEXED) - BCID2

## 2023-04-13 LAB — HEPATITIS B SURFACE ANTIGEN: Hepatitis B Surface Ag: NONREACTIVE

## 2023-04-13 LAB — CBC
HCT: 30.2 % — ABNORMAL LOW (ref 39.0–52.0)
Hemoglobin: 9.3 g/dL — ABNORMAL LOW (ref 13.0–17.0)
MCH: 29.8 pg (ref 26.0–34.0)
MCHC: 30.8 g/dL (ref 30.0–36.0)
MCV: 96.8 fL (ref 80.0–100.0)
Platelets: 199 10*3/uL (ref 150–400)
RBC: 3.12 MIL/uL — ABNORMAL LOW (ref 4.22–5.81)
RDW: 16.2 % — ABNORMAL HIGH (ref 11.5–15.5)
WBC: 14.9 10*3/uL — ABNORMAL HIGH (ref 4.0–10.5)
nRBC: 0 % (ref 0.0–0.2)

## 2023-04-13 LAB — PROCALCITONIN: Procalcitonin: 7.08 ng/mL

## 2023-04-13 MED ORDER — CALCITRIOL 0.25 MCG PO CAPS
0.2500 ug | ORAL_CAPSULE | Freq: Every day | ORAL | Status: DC
  Administered 2023-04-14 – 2023-04-21 (×8): 0.25 ug via ORAL
  Filled 2023-04-13 (×9): qty 1

## 2023-04-13 MED ORDER — CHLORHEXIDINE GLUCONATE CLOTH 2 % EX PADS
6.0000 | MEDICATED_PAD | Freq: Every day | CUTANEOUS | Status: DC
  Administered 2023-04-13 – 2023-04-20 (×6): 6 via TOPICAL

## 2023-04-13 MED ORDER — DIGOXIN 62.5 MCG PO TABS: 1.0000 | ORAL_TABLET | ORAL | Status: DC

## 2023-04-13 MED ORDER — MIDODRINE HCL 5 MG PO TABS
20.0000 mg | ORAL_TABLET | Freq: Three times a day (TID) | ORAL | Status: DC
  Administered 2023-04-13 – 2023-04-21 (×23): 20 mg via ORAL
  Filled 2023-04-13 (×23): qty 4

## 2023-04-13 MED ORDER — DIGOXIN 125 MCG PO TABS
0.0625 mg | ORAL_TABLET | ORAL | Status: DC
  Administered 2023-04-13 – 2023-04-21 (×5): 0.0625 mg via ORAL
  Filled 2023-04-13 (×6): qty 1

## 2023-04-13 MED ORDER — METRONIDAZOLE 500 MG/100ML IV SOLN
500.0000 mg | Freq: Two times a day (BID) | INTRAVENOUS | Status: DC
  Administered 2023-04-13 – 2023-04-14 (×3): 500 mg via INTRAVENOUS
  Filled 2023-04-13 (×3): qty 100

## 2023-04-13 MED ORDER — ACETAMINOPHEN 650 MG RE SUPP: 650.0000 mg | Freq: Four times a day (QID) | RECTAL | Status: DC | PRN

## 2023-04-13 MED ORDER — CHLORHEXIDINE GLUCONATE CLOTH 2 % EX PADS: 6.0000 | MEDICATED_PAD | Freq: Every day | CUTANEOUS | Status: DC

## 2023-04-13 MED ORDER — ACETAMINOPHEN 325 MG PO TABS
650.0000 mg | ORAL_TABLET | Freq: Four times a day (QID) | ORAL | Status: DC | PRN
  Administered 2023-04-14 (×2): 650 mg via ORAL
  Filled 2023-04-13 (×2): qty 2

## 2023-04-13 MED ORDER — VANCOMYCIN VARIABLE DOSE PER UNSTABLE RENAL FUNCTION (PHARMACIST DOSING): Status: DC

## 2023-04-13 MED ORDER — AMIODARONE HCL 200 MG PO TABS
200.0000 mg | ORAL_TABLET | Freq: Every day | ORAL | Status: DC
  Administered 2023-04-13 – 2023-04-21 (×9): 200 mg via ORAL
  Filled 2023-04-13 (×9): qty 1

## 2023-04-13 MED ORDER — SODIUM CHLORIDE 0.9 % IV SOLN
2.0000 g | INTRAVENOUS | Status: DC
  Administered 2023-04-13 – 2023-04-15 (×3): 2 g via INTRAVENOUS
  Filled 2023-04-13 (×3): qty 20

## 2023-04-13 MED ORDER — SODIUM CHLORIDE 0.9 % IV SOLN
1.0000 g | INTRAVENOUS | Status: DC
  Filled 2023-04-13: qty 10

## 2023-04-13 MED ORDER — APIXABAN 2.5 MG PO TABS
2.5000 mg | ORAL_TABLET | Freq: Two times a day (BID) | ORAL | Status: DC
  Administered 2023-04-13 – 2023-04-21 (×14): 2.5 mg via ORAL
  Filled 2023-04-13 (×16): qty 1

## 2023-04-13 MED ORDER — PREGABALIN 75 MG PO CAPS
75.0000 mg | ORAL_CAPSULE | Freq: Two times a day (BID) | ORAL | Status: DC
  Administered 2023-04-13 – 2023-04-18 (×9): 75 mg via ORAL
  Filled 2023-04-13: qty 3
  Filled 2023-04-13 (×9): qty 1

## 2023-04-13 MED ORDER — ATORVASTATIN CALCIUM 40 MG PO TABS
40.0000 mg | ORAL_TABLET | Freq: Every day | ORAL | Status: DC
  Administered 2023-04-13 – 2023-04-20 (×7): 40 mg via ORAL
  Filled 2023-04-13 (×8): qty 1

## 2023-04-13 NOTE — Assessment & Plan Note (Signed)
Cont eliquis 2.5 BID, recently restarted on med after being held this past week for GIB. Cont amiodarone Looks like he's on digoxin started during this past admit for a.fib RVR rate control as well (despite being a dialysis patient), does look like nephrology is aware of digoxin use. Will continue this for now Consider switching to another med if at all possible.

## 2023-04-13 NOTE — Assessment & Plan Note (Addendum)
Call nephro in AM for routine dialysis while in hospital. Continue midodrine for chronic hypotension.

## 2023-04-13 NOTE — Assessment & Plan Note (Deleted)
Pt nonambulatory at baseline for a number of months, uses wheelchair at baseline.  Inability to move L leg/hip (due to pain) is new today however he reports.

## 2023-04-13 NOTE — ED Notes (Signed)
ED TO INPATIENT HANDOFF REPORT  ED Nurse Name and Phone #: Lenell Antu Name/Age/Gender Justin [name] 77 y.o. male Room/Bed: 009C/009C  Code Status   Code Status: Do not attempt resuscitation (DNR) PRE-ARREST INTERVENTIONS DESIRED  Home/SNF/Other Home Patient oriented to: self, place, time, and situation Is this baseline? Yes   Triage Complete: Triage complete  Chief Complaint Acute hip pain, left [M25.552] Dysphagia [R13.10]  Triage Note Pt to ED via [facility] from home. Pt c/o left leg weakness and pain x 1 year but worsening today. Pt usually able to stand and pivot to wheelchair with assistance but unable to do so today. Pt c/o groin pain when attempting to ambulate. Pt denies any injuries. Pt goes to dialysis M,W,F but missed dialysis today d/t pain and weakness. Last treatment was Friday. Pt denies SOB or chest pain.    EMS: 106/62 108 HR 93% RA 103 CBG   Allergies No Known Allergies  Level of Care/Admitting Diagnosis ED Disposition     ED Disposition  Admit   Condition  --   Comment  Hospital Area: [facility] [[id number]]  Level of Care: Telemetry Medical [104]  May admit patient to [facility] or [facility] if equivalent level of care is available:: Yes  Covid Evaluation: Confirmed COVID Negative  Diagnosis: Dysphagia [098119]  Admitting Physician: Miguel Rota [[id number]]  Attending Physician: Miguel Rota [[id number]]  Certification:: I certify this patient will need inpatient services for at least 2 midnights          B Medical/Surgery History 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; radiation seeds   Diabetes mellitus    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) 12/07/2019   Dialysis patient [facility])    ESRD (end stage renal disease) on dialysis (HCC)    Gout    Hypertension    Hypotension 06/13/2022   Right posterior capsular opacification 02/27/2021   Septic shock (HCC)    Vitreous hemorrhage of right eye (HCC) 10/24/2019   Past Surgical History:  Procedure Laterality Date   A/V FISTULAGRAM Left 08/17/2017   Procedure: A/V FISTULAGRAM;  Surgeon: Renford Dills, MD;  Location: [facility] INVASIVE CV LAB;  Service: Cardiovascular;  Laterality: Left;   A/V SHUNT INTERVENTION N/A 08/17/2017   Procedure: A/V SHUNT INTERVENTION;  Surgeon: Renford Dills, MD;  Location: [facility] INVASIVE CV LAB;  Service: Cardiovascular;  Laterality: N/A;   AV FISTULA PLACEMENT Left 05/31/2015   Procedure: ARTERIOVENOUS (AV) FISTULA CREATION;  Surgeon: Renford Dills, MD;  Location: [facility] ORS;  Service: Vascular;  Laterality: Left;   COLONOSCOPY N/A 12/17/2022   Procedure: COLONOSCOPY;  Surgeon: Napoleon Form, MD;  Location: [facility] ENDOSCOPY;  Service: Gastroenterology;  Laterality: N/A;   COLONOSCOPY WITH PROPOFOL N/A 09/11/2022   Procedure: COLONOSCOPY WITH PROPOFOL;  Surgeon: Imogene Burn, MD;  Location: [facility] ENDOSCOPY;  Service: Gastroenterology;  Laterality: N/A;   COLONOSCOPY WITH PROPOFOL N/A 09/17/2022   Procedure: COLONOSCOPY WITH PROPOFOL;  Surgeon: Benancio Deeds, MD;  Location: [facility] ENDOSCOPY;  Service: Gastroenterology;  Laterality: N/A;   ESOPHAGOGASTRODUODENOSCOPY (EGD) WITH PROPOFOL N/A 09/11/2022   Procedure: ESOPHAGOGASTRODUODENOSCOPY (EGD) WITH PROPOFOL;  Surgeon: Imogene Burn, MD;  Location: [facility] ENDOSCOPY;  Service:  Gastroenterology;  Laterality: N/A;   ESOPHAGOGASTRODUODENOSCOPY (EGD) WITH PROPOFOL N/A 12/17/2022   Procedure: ESOPHAGOGASTRODUODENOSCOPY (EGD) WITH PROPOFOL;  Surgeon: Napoleon Form, MD;  Location: [facility] ENDOSCOPY;  Service: Gastroenterology;  Laterality: N/A;   excision bone spurs Bilateral 1989   feet   GIVENS CAPSULE  STUDY N/A 11/10/2022   Procedure: GIVENS CAPSULE STUDY;  Surgeon: Shellia Cleverly, DO;  Location: [facility] ENDOSCOPY;  Service: Gastroenterology;  Laterality: N/A;   HEMOSTASIS CLIP PLACEMENT  09/11/2022   Procedure: HEMOSTASIS CLIP PLACEMENT;  Surgeon: Imogene Burn, MD;  Location: [facility] ENDOSCOPY;  Service: Gastroenterology;;   HEMOSTASIS CLIP PLACEMENT  09/17/2022   Procedure: HEMOSTASIS CLIP PLACEMENT;  Surgeon: Benancio Deeds, MD;  Location: [facility] ENDOSCOPY;  Service: Gastroenterology;;   HEMOSTASIS CLIP PLACEMENT  12/17/2022   Procedure: HEMOSTASIS CLIP PLACEMENT;  Surgeon: Napoleon Form, MD;  Location: [facility] ENDOSCOPY;  Service: Gastroenterology;;   HOT HEMOSTASIS N/A 09/11/2022   Procedure: HOT HEMOSTASIS (ARGON PLASMA COAGULATION/BICAP);  Surgeon: Imogene Burn, MD;  Location: [facility] ENDOSCOPY;  Service: Gastroenterology;  Laterality: N/A;   HOT HEMOSTASIS N/A 09/17/2022   Procedure: HOT HEMOSTASIS (ARGON PLASMA COAGULATION/BICAP);  Surgeon: Benancio Deeds, MD;  Location: [facility] ENDOSCOPY;  Service: Gastroenterology;  Laterality: N/A;   INCISION AND DRAINAGE PERIRECTAL ABSCESS N/A 06/15/2022   Procedure: IRRIGATION AND DEBRIDEMENT PERIRECTAL ABSCESS;  Surgeon: Franky Macho, MD;  Location: [facility] ORS;  Service: General;  Laterality: N/A;   IR EXCHANGE BILIARY DRAIN  03/16/2023   KNEE SURGERY Left 1998   arthroscopy   POLYPECTOMY  09/11/2022   Procedure: POLYPECTOMY;  Surgeon: Imogene Burn, MD;  Location: [facility] END[facility]COPY;  Service: Gastroenterology;;   SHOULDER SURGERY Left 1994   rotator cuff     A IV Location/Drains/Wounds Patient Lines/Drains/Airways Status     Active Line/Drains/Airways     Name Placement date Placement time Site Days   Peripheral IV 04/13/23 20 G Anterior;Right Forearm 04/13/23  0605  Forearm  less than 1   Hemodialysis Catheter Right Subclavian --  --  Subclavian  --   Closed System Drain 1 Lateral;Right Abdomen 8.5 Fr. 03/16/23  1426  Abdomen  28   Pressure Injury 11/09/22  Buttocks Left;Right;Lower Stage 2 -  Partial thickness loss of dermis presenting as a shallow open injury with a red, pink wound bed without slough. 1x2 pale colored injury 11/09/22  0525  -- 155   Pressure Injury 12/14/22 Heel Left;Posterior Unstageable - Full thickness tissue loss in which the base of the injury is covered by slough (yellow, tan, gray, green or brown) and/or eschar (tan, brown or black) in the wound bed. 12/14/22  0700  -- 120   Pressure Injury 12/14/22 Heel Right Unstageable - Full thickness tissue loss in which the base of the injury is covered by slough (yellow, tan, gray, green or brown) and/or eschar (tan, brown or black) in the wound bed. 12/14/22  0700  -- 120   Pressure Injury 12/24/22 Thigh Anterior;Distal;Right Stage 2 -  Partial thickness loss of dermis presenting as a shallow open injury with a red, pink wound bed without slough. Healing 12/24/22  2000  -- 110   Wound / Incision (Open or Dehisced) 01/29/20 Non-pressure wound Back Lower abscess 01/29/20  2303  Back  1170   Wound / Incision (Open or Dehisced) 06/13/22 Non-pressure wound Buttocks Left abscess to inside of buttock 06/13/22  0228  Buttocks  304   Wound / Incision (Open or Dehisced) 11/15/22 Other (Comment) Hip Left pink, bleeding  11/15/22  --  Hip  149            Intake/Output Last 24 hours  Intake/Output Summary (Last 24 hours) at 04/13/2023 1432 Last data filed at 04/13/2023 0428 Gross per 24 hour  Intake --  Output 175 ml  Net -175 ml    Labs/Imaging Results for orders placed or performed during the hospital encounter of 04/12/23 (from the past 48 hour(s))  C-reactive protein     Status: Abnormal   Collection Time: 04/12/23  6:37 PM  Result Value Ref Range   CRP 14.3 (H) <1.0 mg/dL    Comment: Performed at [facility], [address], [place], [place] [address]  Basic metabolic panel     Status: Abnormal   Collection Time: 04/12/23  6:37 PM  Result Value Ref Range   Sodium 139 135 -  145 mmol/L   Potassium 4.2 3.5 - 5.1 mmol/L   Chloride 100 98 - 111 mmol/L   CO2 23 22 - 32 mmol/L   Glucose, Bld 89 70 - 99 mg/dL    Comment: Glucose reference range applies only to samples taken after fasting for at least 8 hours.   BUN 45 (H) 8 - 23 mg/dL   Creatinine, Ser 6.04 (H) 0.61 - 1.24 mg/dL   Calcium 9.1 8.9 - 54.0 mg/dL   GFR, Estimated 6 (L) >60 mL/min    Comment: (NOTE) Calculated using the CKD-EPI Creatinine Equation (2021)    Anion gap 16 (H) 5 - 15    Comment: Performed at [facility], [address] [place], [place] [address]  CBC     Status: Abnormal   Collection Time: 04/12/23  6:37 PM  Result Value Ref Range   WBC 16.9 (H) 4.0 - 10.5 K/uL   RBC 3.30 (L) 4.22 - 5.81 MIL/uL   Hemoglobin 10.2 (L) 13.0 - 17.0 g/dL   HCT 14.7 (L) 82.9 - 56.2 %   MCV 96.7 80.0 - 100.0 fL   MCH 30.9 26.0 - 34.0 pg   MCHC 32.0 30.0 - 36.0 g/dL   RDW 13.0 (H) 86.5 - 78.4 %   Platelets 204 150 - 400 K/uL    Comment: REPEATED TO VERIFY   nRBC 0.0 0.0 - 0.2 %    Comment: Performed at [facility], [address], [place], [place] [address]  CBG monitoring, ED     Status: None   Collection Time: 04/12/23  6:44 PM  Result Value Ref Range   Glucose-Capillary 87 70 - 99 mg/dL    Comment: Glucose reference range applies only to samples taken after fasting for at least 8 hours.  Sedimentation rate     Status: Abnormal   Collection Time: 04/12/23  7:07 PM  Result Value Ref Range   Sed Rate 65 (H) 0 - 16 mm/hr    Comment: Performed at [facility], [address] [place], [place] [address]  Blood culture (routine x 2)     Status: None (Preliminary result)   Collection Time: 04/12/23  7:08 PM   Specimen: BLOOD RIGHT HAND  Result Value Ref Range   Specimen Description BLOOD RIGHT HAND    Special Requests      BOTTLES DRAWN AEROBIC AND ANAEROBIC Blood Culture adequate volume   Culture  Setup Time      GRAM NEGATIVE RODS IN BOTH AEROBIC AND ANAEROBIC  BOTTLES Organism ID to follow CRITICAL RESULT CALLED TO, READ BACK BY AND VERIFIED WITH:  Carmelia Bake PHARMD, AT 1041 04/13/23 Renato Shin Performed at [facility], [address], [place], [place] [address]    Culture GRAM NEGATIVE RODS    Report Status PENDING   Blood Culture ID Panel (Reflexed)     Status: Abnormal   Collection Time: 04/12/23  7:08 PM  Result Value Ref Range   Enterococcus faecalis NOT DETECTED NOT DETECTED   Enterococcus Faecium NOT DETECTED NOT DETECTED   Listeria monocytogenes NOT DETECTED NOT DETECTED   Staphylococcus species NOT DETECTED NOT DETECTED   Staphylococcus aureus (BCID) NOT DETECTED NOT DETECTED   Staphylococcus epidermidis NOT DETECTED NOT DETECTED   Staphylococcus lugdunensis NOT DETECTED NOT DETECTED   Streptococcus species NOT DETECTED NOT DETECTED   Streptococcus agalactiae NOT DETECTED NOT DETECTED   Streptococcus pneumoniae NOT DETECTED NOT DETECTED   Streptococcus pyogenes NOT DETECTED NOT DETECTED   A.calcoaceticus-baumannii NOT DETECTED NOT DETECTED   Bacteroides fragilis NOT DETECTED NOT DETECTED   Enterobacterales DETECTED (A) NOT DETECTED    Comment: Enterobacterales represent a large order of gram negative bacteria, not a single organism. CRITICAL RESULT CALLED TO, READ BACK BY AND VERIFIED WITH: B. AGEE PHARMD, AT 1041 04/13/23 D. VANHOOK    Enterobacter cloacae complex NOT DETECTED NOT DETECTED   Escherichia coli NOT DETECTED NOT DETECTED   Klebsiella aerogenes NOT DETECTED NOT DETECTED   Klebsiella oxytoca NOT DETECTED NOT DETECTED   Klebsiella pneumoniae DETECTED (A) NOT DETECTED    Comment: CRITICAL RESULT CALLED TO, READ BACK BY AND VERIFIED WITH: B. AGEE PHARMD, AT 1041 04/13/23 D. VANHOOK    Proteus species NOT DETECTED NOT DETECTED   Salmonella species NOT DETECTED NOT DETECTED   Serratia marcescens NOT DETECTED NOT DETECTED   Haemophilus influenzae NOT DETECTED NOT DETECTED   Neisseria meningitidis NOT DETECTED NOT  DETECTED   Pseudomonas aeruginosa NOT DETECTED NOT DETECTED   Stenotrophomonas maltophilia NOT DETECTED NOT DETECTED   Candida albicans NOT DETECTED NOT DETECTED   Candida auris NOT DETECTED NOT DETECTED   Candida glabrata NOT DETECTED NOT DETECTED   Candida krusei NOT DETECTED NOT DETECTED   Candida parapsilosis NOT DETECTED NOT DETECTED   Candida tropicalis NOT DETECTED NOT DETECTED   Cryptococcus neoformans/gattii NOT DETECTED NOT DETECTED   CTX-M ESBL NOT DETECTED NOT DETECTED   Carbapenem resistance IMP NOT DETECTED NOT DETECTED   Carbapenem resistance KPC NOT DETECTED NOT DETECTED   Carbapenem resistance NDM NOT DETECTED NOT DETECTED   Carbapenem resist OXA 48 LIKE NOT DETECTED NOT DETECTED   Carbapenem resistance VIM NOT DETECTED NOT DETECTED    Comment: Performed at [facility], [address], [place], [place] [address]  Blood culture (routine x 2)     Status: None (Preliminary result)   Collection Time: 04/12/23  7:38 PM   Specimen: BLOOD  Result Value Ref Range   Specimen Description BLOOD SITE NOT SPECIFIED    Special Requests      BOTTLES DRAWN AEROBIC AND ANAEROBIC Blood Culture adequate volume   Culture  Setup Time      GRAM NEGATIVE RODS IN BOTH AEROBIC AND ANAEROBIC BOTTLES CRITICAL RESULT CALLED TO, READ BACK BY AND VERIFIED WITHCarmelia Bake PHARMD, AT 1041 04/13/23 Renato Shin Performed at [facility], [address] [place], [place] [address]    Culture GRAM NEGATIVE RODS    Report Status PENDING   CBC     Status: Abnormal   Collection Time: 04/13/23  6:01 AM  Result Value Ref Range  WBC 14.9 (H) 4.0 - 10.5 K/uL   RBC 3.12 (L) 4.22 - 5.81 MIL/uL   Hemoglobin 9.3 (L) 13.0 - 17.0 g/dL   HCT 16.1 (L) 09.6 - 04.5 %   MCV 96.8 80.0 - 100.0 fL   MCH 29.8 26.0 - 34.0 pg   MCHC 30.8 30.0 - 36.0 g/dL   RDW 40.9 (H) 81.1 - 91.4 %   Platelets 199 150 - 400 K/uL   nRBC 0.0 0.0 - 0.2 %    Comment: Performed at [facility], [address]  [place], [place] [address]  Basic metabolic panel     Status: Abnormal   Collection Time: 04/13/23  6:01 AM  Result Value Ref Range   Sodium 139 135 - 145 mmol/L   Potassium 4.0 3.5 - 5.1 mmol/L   Chloride 103 98 - 111 mmol/L   CO2 22 22 - 32 mmol/L   Glucose, Bld 72 70 - 99 mg/dL    Comment: Glucose reference range applies only to samples taken after fasting for at least 8 hours.   BUN 51 (H) 8 - 23 mg/dL   Creatinine, Ser 6.21 (H) 0.61 - 1.24 mg/dL   Calcium 8.8 (L) 8.9 - 10.3 mg/dL   GFR, Estimated 6 (L) >60 mL/min    Comment: (NOTE) Calculated using the CKD-EPI Creatinine Equation (2021)    Anion gap 14 5 - 15    Comment: Performed at [facility], [address], [place], [place] [address]  Procalcitonin     Status: None   Collection Time: 04/13/23  6:01 AM  Result Value Ref Range   Procalcitonin 7.08 ng/mL    Comment:        Interpretation: PCT > 2 ng/mL: Systemic infection (sepsis) is likely, unless other causes are known. (NOTE)       Sepsis PCT Algorithm           Lower Respiratory Tract                                      Infection PCT Algorithm    ----------------------------     ----------------------------         PCT < 0.25 ng/mL                PCT < 0.10 ng/mL          Strongly encourage             Strongly discourage   discontinuation of antibiotics    initiation of antibiotics    ----------------------------     -----------------------------       PCT 0.25 - 0.50 ng/mL            PCT 0.10 - 0.25 ng/mL               OR       >80% decrease in PCT            Discourage initiation of                                            antibiotics      Encourage discontinuation           of antibiotics    ----------------------------     -----------------------------  PCT >= 0.50 ng/mL              PCT 0.26 - 0.50 ng/mL               AND       <80% decrease in PCT              Encourage initiation of                                             antibiotics        Encourage continuation           of antibiotics    ----------------------------     -----------------------------        PCT >= 0.50 ng/mL                  PCT > 0.50 ng/mL               AND         increase in PCT                  Strongly encourage                                      initiation of antibiotics    Strongly encourage escalation           of antibiotics                                     -----------------------------                                           PCT <= 0.25 ng/mL                                                 OR                                        > 80% decrease in PCT                                      Discontinue / Do not initiate                                             antibiotics  Performed at [facility], [address] [place], [place] [address]    CT HIP LEFT WO CONTRAST  Result Date: 04/13/2023 CLINICAL DATA:  Septic arthritis suspected in the left hip. EXAM: CT OF THE LEFT HIP WITHOUT CONTRAST TECHNIQUE: Multidetector CT imaging of the left hip was performed according to the standard protocol. Multiplanar CT image reconstructions were  also generated. RADIATION DOSE REDUCTION: This exam was performed according to the departmental dose-optimization program which includes automated exposure control, adjustment of the mA and/or kV according to patient size and/or use of iterative reconstruction technique. COMPARISON:  CT abdomen and pelvis and reconstructions recently 03/29/2023 FINDINGS: Bones/Joint/Cartilage Somewhat dense bones are again noted consistent with renal osteodystrophy. There is moderate superior joint space loss at the left hip with degenerative subcortical cystic changes of the superior acetabulum and small acetabular and femoral head osteophytes. No left hip joint effusion or joint surface destruction is seen. Narrowing and trace spurring of the pubic symphysis is again noted. 3.3 cm hypodense collection again underlies  the left ischial tuberosity was previously much better delineated and most likely represents an abscess. There is cortical indistinctness overlying this collection suspicious for osteomyelitis. Ligaments Suboptimally assessed by CT. Muscles and Tendons There is mild atrophy of the gluteal and upper thigh musculature, mild volume loss in the floor of pelvis muscles. No acute tendon abnormality is seen. No intramuscular fluid collections. Soft tissues There is heavy iliofemoral calcific arterial plaque. Minimal ascites in the posterior deep pelvis again is noted. Enlarged prostate with fiducial markers. Bladder is nondistended but appears thickened which could be due to XRT cystitis or bacterial cystitis. There are small bilateral inguinal fat hernias. IMPRESSION: 1. 3.3 cm hypodense collection again underlies the left ischial tuberosity, most likely an abscess and was seen previously. There is cortical indistinctness overlying this collection suspicious for osteomyelitis. 2. Moderate left hip osteoarthritis. No left hip joint effusion or joint surface destruction. 3. Renal osteodystrophy. 4. Calcific arteriosclerosis. 5. Minimal ascites in the posterior deep pelvis. 6. Chronic enlarged prostate with fiducial markers. 7. Bladder wall thickening which could be due to nondistention, XRT or bacterial cystitis. 8. Small bilateral inguinal fat hernias. Electronically Signed   By: Almira Bar M.D.   On: 04/13/2023 04:52   MR LUMBAR SPINE WO CONTRAST  Result Date: 04/12/2023 CLINICAL DATA:  Acute myelopathy EXAM: MRI THORACIC AND LUMBAR SPINE WITHOUT CONTRAST TECHNIQUE: Multiplanar and multiecho pulse sequences of the thoracic and lumbar spine were obtained without intravenous contrast. COMPARISON:  None Available. FINDINGS: MRI THORACIC SPINE FINDINGS Alignment:  Physiologic. Vertebrae: The heterogeneous bone marrow signal without discrete lesion. There are Modic type 2 endplate signal changes at T8-9, T10-11 and  T11-12. No acute fracture. Cord:  Normal signal and morphology. Paraspinal and other soft tissues: Small pleural effusions Disc levels: There is no spinal canal stenosis or neural impingement. No thoracic disc herniation. MRI LUMBAR SPINE FINDINGS Segmentation:  Standard. Alignment:  Grade 1 anterolisthesis at L3-4 Vertebrae: No fracture, evidence of discitis, or bone lesion. There are type 2 Modic signal changes at all levels. Schmorl's nodes are most prominent at L1 L2 and L3. There is mild degenerative height loss at L4. Conus medullaris and cauda equina: Conus extends to the L1 level. Conus and cauda equina appear normal. Paraspinal and other soft tissues: Negative. Disc levels: L1-L2: Normal disc space and facet joints. No spinal canal stenosis. No neural foraminal stenosis. L2-L3: Small disc bulge and mild facet hypertrophy. No spinal canal stenosis. No neural foraminal stenosis. L3-L4: Large disc bulge and moderate facet hypertrophy. Moderate spinal canal stenosis. Severe bilateral neural foraminal stenosis. L4-L5: Intermediate sized disc bulge and mild facet hypertrophy. No spinal canal stenosis. Mild right neural foraminal stenosis. L5-S1: Intermediate sized disc bulge with large right extraforaminal osteophyte. No spinal canal stenosis. No neural foraminal stenosis. Visualized sacrum: Normal. IMPRESSION: 1. No acute abnormality of  the thoracic or lumbar spine. 2. Moderate spinal canal stenosis and severe bilateral neural foraminal stenosis at L3-L4 due to combination of disc bulge and facet arthrosis. 3. Mild right L4-5 neural foraminal stenosis. 4. Small pleural effusions. 5. Widespread degenerative disc disease with multilevel type 2 Modic signal change. Electronically Signed   By: Deatra Robinson M.D.   On: 04/12/2023 23:51   MR THORACIC SPINE WO CONTRAST  Result Date: 04/12/2023 CLINICAL DATA:  Acute myelopathy EXAM: MRI THORACIC AND LUMBAR SPINE WITHOUT CONTRAST TECHNIQUE: Multiplanar and multiecho  pulse sequences of the thoracic and lumbar spine were obtained without intravenous contrast. COMPARISON:  None Available. FINDINGS: MRI THORACIC SPINE FINDINGS Alignment:  Physiologic. Vertebrae: The heterogeneous bone marrow signal without discrete lesion. There are Modic type 2 endplate signal changes at T8-9, T10-11 and T11-12. No acute fracture. Cord:  Normal signal and morphology. Paraspinal and other soft tissues: Small pleural effusions Disc levels: There is no spinal canal stenosis or neural impingement. No thoracic disc herniation. MRI LUMBAR SPINE FINDINGS Segmentation:  Standard. Alignment:  Grade 1 anterolisthesis at L3-4 Vertebrae: No fracture, evidence of discitis, or bone lesion. There are type 2 Modic signal changes at all levels. Schmorl's nodes are most prominent at L1 L2 and L3. There is mild degenerative height loss at L4. Conus medullaris and cauda equina: Conus extends to the L1 level. Conus and cauda equina appear normal. Paraspinal and other soft tissues: Negative. Disc levels: L1-L2: Normal disc space and facet joints. No spinal canal stenosis. No neural foraminal stenosis. L2-L3: Small disc bulge and mild facet hypertrophy. No spinal canal stenosis. No neural foraminal stenosis. L3-L4: Large disc bulge and moderate facet hypertrophy. Moderate spinal canal stenosis. Severe bilateral neural foraminal stenosis. L4-L5: Intermediate sized disc bulge and mild facet hypertrophy. No spinal canal stenosis. Mild right neural foraminal stenosis. L5-S1: Intermediate sized disc bulge with large right extraforaminal osteophyte. No spinal canal stenosis. No neural foraminal stenosis. Visualized sacrum: Normal. IMPRESSION: 1. No acute abnormality of the thoracic or lumbar spine. 2. Moderate spinal canal stenosis and severe bilateral neural foraminal stenosis at L3-L4 due to combination of disc bulge and facet arthrosis. 3. Mild right L4-5 neural foraminal stenosis. 4. Small pleural effusions. 5. Widespread  degenerative disc disease with multilevel type 2 Modic signal change. Electronically Signed   By: Deatra Robinson M.D.   On: 04/12/2023 23:51   CT HEAD WO CONTRAST ( )  Result Date: 04/12/2023 CLINICAL DATA:  Leg pain and weakness, initial encounter EXAM: CT HEAD WITHOUT CONTRAST TECHNIQUE: Contiguous axial images were obtained from the base of the skull through the vertex without intravenous contrast. RADIATION DOSE REDUCTION: This exam was performed according to the departmental dose-optimization program which includes automated exposure control, adjustment of the mA and/or kV according to patient size and/or use of iterative reconstruction technique. COMPARISON:  11/09/2022 FINDINGS: Brain: No evidence of acute infarction, hemorrhage, hydrocephalus, extra-axial collection or mass lesion/mass effect. Vascular: No hyperdense vessel or unexpected calcification. Skull: Normal. Negative for fracture or focal lesion. Sinuses/Orbits: No acute finding. Other: None. IMPRESSION: No acute abnormality noted. Electronically Signed   By: Alcide Clever M.D.   On: 04/12/2023 23:38   [name] CONTRAST  Result Date: 04/12/2023 CLINICAL DATA:  Acute neurologic deficit.  Left leg weakness. EXAM: MRI HEAD WITHOUT CONTRAST TECHNIQUE: Multiplanar, multiecho pulse sequences of the brain and surrounding structures were obtained without intravenous contrast. COMPARISON:  07/10/2011 FINDINGS: Brain: No acute infarct. No acute hemorrhage or extra-axial collection. Small amount of chronic  blood products at the anterior right convexity. There is multifocal hyperintense T2-weighted signal within the periventricular and deep white matter. Mild volume loss. No hydrocephalus. The major midline structures are normal. Vascular: Normal flow voids. Skull and upper cervical spine: Normal marrow signal. Sinuses/Orbits: Negative. Other: None. IMPRESSION: 1. No acute intracranial abnormality. 2. Findings of chronic microvascular ischemia.  Electronically Signed   By: Deatra Robinson M.D.   On: 04/12/2023 23:23   DG Chest Portable 1 View  Result Date: 04/12/2023 CLINICAL DATA:  Pacemaker EXAM: PORTABLE CHEST 1 VIEW COMPARISON:  03/29/2023. FINDINGS: Unremarkable cardiac silhouette. Right-sided hemodialysis catheter tip at the RA/SVC junction. Alveolar opacity left base consistent with an early pneumonia or volume loss. Calcified aorta. No pneumothorax. No pleural effusion. IMPRESSION: Early/mild left base consolidation or volume loss. No pacemaker is seen. Electronically Signed   By: Layla Maw M.D.   On: 04/12/2023 19:40    Pending Labs Unresulted Labs (From admission, onward)     Start     Ordered   04/12/23 2338  Urinalysis, w/ Reflex to Culture (Infection Suspected) -Urine, Clean Catch  Once,   URGENT       Question:  Specimen Source  Answer:  Urine, Clean Catch   04/12/23 2337            Vitals/Pain Today's Vitals   04/13/23 1200 04/13/23 1230 04/13/23 1300 04/13/23 1330  BP: (!) 114/55 122/77 (!) 115/51 121/68  Pulse: 98   100  Resp: 16 13 16  (!) 8  Temp: 97.8 F (36.6 C)     TempSrc: Oral     SpO2: 97%   97%  Weight:      Height:      PainSc:        Isolation Precautions No active isolations  Medications Medications  LORazepam (ATIVAN) tablet 0.5 mg (0.5 mg Oral Not Given 04/12/23 2346)  LORazepam (ATIVAN) injection 1 mg (1 mg Intravenous Not Given 04/12/23 2346)  amiodarone (PACERONE) tablet 200 mg (200 mg Oral Given 04/13/23 0904)  apixaban (ELIQUIS) tablet 2.5 mg (2.5 mg Oral Given 04/13/23 0900)  atorvastatin (LIPITOR) tablet 40 mg (has no administration in time range)  calcitRIOL (ROCALTROL) capsule 0.25 mcg (has no administration in time range)  pregabalin (LYRICA) capsule 75 mg (75 mg Oral Given 04/13/23 0904)  midodrine (PROAMATINE) tablet 20 mg (20 mg Oral Given 04/13/23 0901)  acetaminophen (TYLENOL) tablet 650 mg (has no administration in time range)    Or  acetaminophen (TYLENOL)  suppository 650 mg (has no administration in time range)  metroNIDAZOLE (FLAGYL) IVPB 500 mg (0 mg Intravenous Stopped 04/13/23 0818)  cefTRIAXone (ROCEPHIN) 2 g in sodium chloride 0.9 % 100 mL IVPB (has no administration in time range)  digoxin (LANOXIN) tablet 0.0625 mg (has no administration in time range)  vancomycin (VANCOREADY) IVPB 1750 mg/350 mL (0 mg Intravenous Stopped 04/13/23 0253)  ceFEPIme (MAXIPIME) 2 g in sodium chloride 0.9 % 100 mL IVPB (0 g Intravenous Stopped 04/13/23 0032)    Mobility manual wheelchair     Focused Assessments     R Recommendations: See Admitting Provider Note  Report given to:   Additional Notes:

## 2023-04-13 NOTE — Assessment & Plan Note (Addendum)
Pt nonambulatory at baseline for a number of months, uses wheelchair at baseline.  Inability to move L leg/hip (due to pain) is new today however he reports. Was just in hospital with a 3cm L ischial abscess s/p IR drainage and ABx.  Cultures never grew out an organism per DC summary. Obviously, concerned that recent abscess is to blame for todays presentation + SIRS Got cefepime + Vanc in ED Check procalcitonin Getting CT w/o contrast of L hip to see if abscess reoccurred or other evidence of infection in that area. Check repeat CBC in AM

## 2023-04-13 NOTE — Plan of Care (Signed)

## 2023-04-13 NOTE — Plan of Care (Signed)
IR was requested for image guided drain placement for the left ischial tuberosity fluid collection.  Patient is known to IR service, underwent aspiration of the ischial abscess on 9/17, the abscess was small and no significant residual fluid was obtained, required lavage with 5 mL sterile saline. Cx did not grow aerobically or anaerobically.    Case was reviewed by Dr. Lowella Dandy, no definitive abscess for aspiration found.    Ordering provider notified.   Will delete the IR rad eval order.  Please call IR for questions and concerns.   Justin [name] H Marceil Welp PA-C 04/13/2023 9:00 AM

## 2023-04-13 NOTE — Progress Notes (Addendum)
CT findings including: 1. 3.3 cm hypodense collection again underlies the left ischial tuberosity, most likely an abscess and was seen previously. There is cortical indistinctness overlying this collection suspicious for osteomyelitis. 2. Moderate left hip osteoarthritis. No left hip joint effusion or joint surface destruction.  Assessment: Looks like recurrence of the abscess, this time concerning for osteomyelitis.  No imaging findings to suggest septic arthritis of hip joint at this time.  Plan: Continue empiric cefepime / vanc started by EDP for coverage of SIRS + abscess. Adding flagyl to regimen for anaerobic coverage IR drainage of abscess ordered Day team may wish to consult ID given concern for osteomyelitis now on imaging.

## 2023-04-13 NOTE — ED Notes (Signed)
Step-daughter Beverley Fiedler is requesting to speak to social work and the pt's provider. [phone]

## 2023-04-13 NOTE — ED Notes (Signed)
Patient transported to CT 

## 2023-04-13 NOTE — Assessment & Plan Note (Addendum)
Has cholecystostomy tube in place, changed out regularly at [facility]. Saw gen surg on 9/11 to consider cholecystectomy, they felt risk would be prohibitive though.

## 2023-04-13 NOTE — Assessment & Plan Note (Signed)
History of recent GIB, no evidence of current bleeding and HGB is actually up to 10.2 from 9.x on discharge a couple of days ago.  This despite restarting a low dose of eliquis. Keep close eye on HGB during hospital stay given risk of recurrent bleeding. Continuing eliquis low dose for now.

## 2023-04-13 NOTE — Progress Notes (Signed)
Patient refused to allow assessment of his back and sacral area it was noted that he has a right heel wound that has eschar and open area noted a smell new foam dressing was applied also left shoulder wound removed old dressing and replaced with foam dressing

## 2023-04-13 NOTE — H&P (Signed)
History and Physical    Patient: Justin [name] ZOX:[id number] DOB: [birth year] DOA: 04/12/2023 DOS: the patient was seen and examined on 04/13/2023 PCP: Pcp, No  Patient coming from: Home  Chief Complaint:  Chief Complaint  Patient presents with   Leg Pain   HPI: Justin [name] is a 77 y.o. male with medical history significant of ESRD on HD, GIB, rectal abscess, cholecystitis with perc drain, DM2 (looks diet controlled), chronic hypotension on midodrine.  Ambulatory dysfunction for past 3-4 months, now wheelchair bound at baseline.  Pt with recent admit 9/16-9/26 for ESRD, shock with hypotension.  Workup that admission was notable for a 3.5 cm ischial abscess that IR drained.  Cultures with no growth.  Completed a course of Abx in hospital.  Ultimately discharged home on 9/26.  Pt in to ED today with 1 day history of severe pain in his left groin.  Worse with any attempt to move his L leg.  Passive movement of L leg also makes this worse.  Pt also noted to have SIRS with WBC 16k and a.fib is in RVR, although was having ongoing trouble with a.fib RVR throughout last admission.  He denies any falls or trauma. He denies any saddle anesthesia. He missed his Monday session of hemodialysis today due to his presentation to the emergency department and his last dialysis session was on Friday. He states that he was last normal last night and when he woke up he was unable to move his leg.  Denies fevers, chills.  Review of Systems: As mentioned in the history of present illness. All other systems reviewed and are negative. 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 (HCC) 12/07/2019   Dialysis patient [facility])    ESRD (end stage renal disease) on dialysis (HCC)    Gout    Hypertension    Hypotension 06/13/2022   Right posterior capsular opacification 02/27/2021   Septic shock (HCC)    Vitreous hemorrhage of right eye (HCC) 10/24/2019   Past Surgical History:  Procedure Laterality Date   A/V FISTULAGRAM Left 08/17/2017   Procedure: A/V FISTULAGRAM;  Surgeon: Renford Dills, MD;  Location: [facility] INVASIVE CV LAB;  Service: Cardiovascular;  Laterality: Left;   A/V SHUNT INTERVENTION N/A 08/17/2017   Procedure: A/V SHUNT INTERVENTION;  Surgeon: Renford Dills, MD;  Location: [facility] INVASIVE CV LAB;  Service: Cardiovascular;  Laterality: N/A;   AV FISTULA PLACEMENT Left 05/31/2015   Procedure: ARTERIOVENOUS (AV) FISTULA CREATION;  Surgeon: Renford Dills, MD;  Location: [facility] ORS;  Service: Vascular;  Laterality: Left;   COLONOSCOPY N/A 12/17/2022   Procedure: COLONOSCOPY;  Surgeon: Napoleon Form, MD;  Location: [facility] ENDOSCOPY;  Service: Gastroenterology;  Laterality: N/A;   COLONOSCOPY WITH PROPOFOL N/A 09/11/2022   Procedure: COLONOSCOPY WITH PROPOFOL;  Surgeon: Imogene Burn, MD;  Location: [facility] ENDOSCOPY;  Service: Gastroenterology;  Laterality: N/A;   COLONOSCOPY WITH PROPOFOL N/A 09/17/2022   Procedure: COLONOSCOPY WITH PROPOFOL;  Surgeon: Benancio Deeds, MD;  Location: [facility] ENDOSCOPY;  Service: Gastroenterology;  Laterality: N/A;   ESOPHAGOGASTRODUODENOSCOPY (EGD) WITH PROPOFOL N/A 09/11/2022   Procedure: ESOPHAGOGASTRODUODENOSCOPY (EGD) WITH PROPOFOL;  Surgeon: Imogene Burn, MD;  Location: [facility] ENDOSCOPY;  Service: Gastroenterology;  Laterality: N/A;   ESOPHAGOGASTRODUODENOSCOPY (EGD) WITH PROPOFOL N/A 12/17/2022   Procedure: ESOPHAGOGASTRODUODENOSCOPY (EGD) WITH PROPOFOL;  Surgeon: Napoleon Form, MD;  Location: [facility] ENDOSCOPY;  Service: Gastroenterology;  Laterality: N/A;   excision bone spurs Bilateral 1989   feet   GIVENS  CAPSULE STUDY N/A 11/10/2022   Procedure: GIVENS CAPSULE STUDY;  Surgeon: Shellia Cleverly, DO;  Location: [facility] ENDOSCOPY;  Service: Gastroenterology;  Laterality: N/A;   HEMOSTASIS CLIP PLACEMENT  09/11/2022   Procedure: HEMOSTASIS CLIP PLACEMENT;  Surgeon: Imogene Burn, MD;  Location: [facility] ENDOSCOPY;  Service: Gastroenterology;;   HEMOSTASIS CLIP PLACEMENT  09/17/2022   Procedure: HEMOSTASIS CLIP PLACEMENT;  Surgeon: Benancio Deeds, MD;  Location: [facility] ENDOSCOPY;  Service: Gastroenterology;;   HEMOSTASIS CLIP PLACEMENT  12/17/2022   Procedure: HEMOSTASIS CLIP PLACEMENT;  Surgeon: Napoleon Form, MD;  Location: [facility] ENDOSCOPY;  Service: Gastroenterology;;   HOT HEMOSTASIS N/A 09/11/2022   Procedure: HOT HEMOSTASIS (ARGON PLASMA COAGULATION/BICAP);  Surgeon: Imogene Burn, MD;  Location: [facility] ENDOSCOPY;  Service: Gastroenterology;  Laterality: N/A;   HOT HEMOSTASIS N/A 09/17/2022   Procedure: HOT HEMOSTASIS (ARGON PLASMA COAGULATION/BICAP);  Surgeon: Benancio Deeds, MD;  Location: [facility] ENDOSCOPY;  Service: Gastroenterology;  Laterality: N/A;   INCISION AND DRAINAGE PERIRECTAL ABSCESS N/A 06/15/2022   Procedure: IRRIGATION AND DEBRIDEMENT PERIRECTAL ABSCESS;  Surgeon: Franky Macho, MD;  Location: [facility] ORS;  Service: General;  Laterality: N/A;   IR EXCHANGE BILIARY DRAIN  03/16/2023   KNEE SURGERY Left 1998   arthroscopy   POLYPECTOMY  09/11/2022   Procedure: POLYPECTOMY;  Surgeon: Imogene Burn, MD;  Location: [facility] ENDOSCOPY;  Service: Gastroenterology;;   SHOULDER SURGERY Left 1994   rotator cuff   Social History:  reports that he quit smoking about 44 years ago. His smoking use included cigarettes. He has never used smokeless tobacco. He reports that he does not drink alcohol and does not use drugs.  No Known Allergies  Family History  Problem Relation Age of Onset   Kidney disease Mother    Alcoholism Father    Alcoholism Brother    Colon cancer Neg Hx    Stomach cancer Neg Hx     Esophageal cancer Neg Hx     Prior to Admission medications   Medication Sig Start Date End Date Taking? Authorizing Provider  acetaminophen (TYLENOL) 325 MG tablet Take 2 tablets (650 mg total) by mouth every 6 (six) hours as needed for mild pain (or Fever >/= 101). 11/24/22  Yes Pokhrel, Laxman, MD  amiodarone (PACERONE) 200 MG tablet Take 1 tablet (200 mg total) by mouth daily. 04/09/23  Yes Burnadette Pop, MD  apixaban (ELIQUIS) 2.5 MG TABS tablet Take 1 tablet (2.5 mg total) by mouth 2 (two) times daily. 04/08/23  Yes Burnadette Pop, MD  atorvastatin (LIPITOR) 40 MG tablet Take 40 mg by mouth at bedtime. 10/30/14  Yes [provider]  calcitRIOL (ROCALTROL) 0.25 MCG capsule Take 3 capsules (0.75 mcg total) by mouth every Monday, Wednesday, and Friday with hemodialysis. Patient taking differently: Take 0.25 mcg by mouth daily. 11/25/22  Yes Pokhrel, Laxman, MD  Digoxin 62.5 MCG TABS Take 1 tablet by mouth every other day. 04/10/23  Yes [name], MD  midodrine (PROAMATINE) 10 MG tablet Take 2 tablets (20 mg total) by mouth 3 (three) times daily. 04/08/23  Yes Burnadette Pop, MD  pantoprazole (PROTONIX) 40 MG tablet Take 1 tablet (40 mg  total) by mouth 2 (two) times daily before a meal. Patient taking differently: Take 40 mg by mouth daily. 11/24/22  Yes Pokhrel, Laxman, MD  pregabalin (LYRICA) 75 MG capsule Take 1 capsule (75 mg total) by mouth at bedtime. Patient taking differently: Take 75 mg by mouth 2 (two) times daily. 11/24/22 11/24/23 Yes Pokhrel, Laxman, MD  cinacalcet (SENSIPAR) 30 MG tablet Take 30 mg by mouth daily. Patient not taking: Reported on 04/13/2023 08/25/22   [provider]  sevelamer carbonate (RENVELA) 800 MG tablet Take 800 mg by mouth 2 (two) times daily. Patient not taking: Reported on 03/29/2023    [provider]    Physical Exam: Vitals:   04/13/23 0145 04/13/23 0200 04/13/23 0245 04/13/23 0300  BP: 97/76 (!) 133/56 (!) 128/47 (!)  140/64  Pulse:    74  Resp:    16  Temp:      TempSrc:      SpO2:    98%  Weight:      Height:       Constitutional: NAD, calm, comfortable Respiratory: clear to auscultation bilaterally, no wheezing, no crackles. Normal respiratory effort. No accessory muscle use.  Cardiovascular: Irr, irr, tachycardic  Abdomen: no tenderness, no masses palpated. No hepatosplenomegaly. Bowel sounds positive.  Musculoskeletal: Severe pain with any attempt to ROM the L hip (active or passive). Neurologic: Sensation to L leg is grossly intact. Psychiatric: Normal judgment and insight. Alert and oriented x 3. Normal mood.   Data Reviewed:    Labs on Admission: I have personally reviewed following labs and imaging studies  CBC: Recent Labs  Lab 04/07/23 1024 04/12/23 1837  WBC 8.3 16.9*  HGB 9.4* 10.2*  HCT 30.1* 31.9*  MCV 97.7 96.7  PLT 170 204   Basic Metabolic Panel: Recent Labs  Lab 04/07/23 1024 04/12/23 1837  NA 137 139  K 3.4* 4.2  CL 100 100  CO2 28 23  GLUCOSE 127* 89  BUN 34* 45*  CREATININE 7.20* 8.57*  CALCIUM 8.0* 9.1  PHOS 3.3  --    GFR: Estimated Creatinine Clearance: 7.2 mL/min (A) (by C-G formula based on SCr of 8.57 mg/dL (H)). Liver Function Tests: Recent Labs  Lab 04/07/23 1024  ALBUMIN 2.0*   No results for input(s): "LIPASE", "AMYLASE" in the last 168 hours. No results for input(s): "AMMONIA" in the last 168 hours. Coagulation Profile: No results for input(s): "INR", "PROTIME" in the last 168 hours. Cardiac Enzymes: No results for input(s): "CKTOTAL", "CKMB", "CKMBINDEX", "TROPONINI" in the last 168 hours. BNP (last 3 results) No results for input(s): "PROBNP" in the last 8760 hours. HbA1C: No results for input(s): "HGBA1C" in the last 72 hours. CBG: Recent Labs  Lab 04/12/23 1844  GLUCAP 87   Lipid Profile: No results for input(s): "CHOL", "HDL", "LDLCALC", "TRIG", "CHOLHDL", "LDLDIRECT" in the last 72 hours. Thyroid Function Tests: No  results for input(s): "TSH", "T4TOTAL", "FREET4", "T3FREE", "THYROIDAB" in the last 72 hours. Anemia Panel: No results for input(s): "VITAMINB12", "FOLATE", "FERRITIN", "TIBC", "IRON", "RETICCTPCT" in the last 72 hours. Urine analysis:    Component Value Date/Time   COLORURINE AMBER (A) 05/27/2020 1451   APPEARANCEUR CLOUDY (A) 05/27/2020 1451   LABSPEC 1.017 05/27/2020 1451   PHURINE 6.0 05/27/2020 1451   GLUCOSEU 50 (A) 05/27/2020 1451   HGBUR LARGE (A) 05/27/2020 1451   BILIRUBINUR NEGATIVE 05/27/2020 1451   KETONESUR NEGATIVE 05/27/2020 1451   PROTEINUR 100 (A) 05/27/2020 1451   UROBILINOGEN 0.2 08/02/2010 1534   NITRITE  NEGATIVE 05/27/2020 1451   LEUKOCYTESUR SMALL (A) 05/27/2020 1451    Radiological Exams on Admission: MR LUMBAR SPINE WO CONTRAST  Result Date: 04/12/2023 CLINICAL DATA:  Acute myelopathy EXAM: MRI THORACIC AND LUMBAR SPINE WITHOUT CONTRAST TECHNIQUE: Multiplanar and multiecho pulse sequences of the thoracic and lumbar spine were obtained without intravenous contrast. COMPARISON:  None Available. FINDINGS: MRI THORACIC SPINE FINDINGS Alignment:  Physiologic. Vertebrae: The heterogeneous bone marrow signal without discrete lesion. There are Modic type 2 endplate signal changes at T8-9, T10-11 and T11-12. No acute fracture. Cord:  Normal signal and morphology. Paraspinal and other soft tissues: Small pleural effusions Disc levels: There is no spinal canal stenosis or neural impingement. No thoracic disc herniation. MRI LUMBAR SPINE FINDINGS Segmentation:  Standard. Alignment:  Grade 1 anterolisthesis at L3-4 Vertebrae: No fracture, evidence of discitis, or bone lesion. There are type 2 Modic signal changes at all levels. Schmorl's nodes are most prominent at L1 L2 and L3. There is mild degenerative height loss at L4. Conus medullaris and cauda equina: Conus extends to the L1 level. Conus and cauda equina appear normal. Paraspinal and other soft tissues: Negative. Disc  levels: L1-L2: Normal disc space and facet joints. No spinal canal stenosis. No neural foraminal stenosis. L2-L3: Small disc bulge and mild facet hypertrophy. No spinal canal stenosis. No neural foraminal stenosis. L3-L4: Large disc bulge and moderate facet hypertrophy. Moderate spinal canal stenosis. Severe bilateral neural foraminal stenosis. L4-L5: Intermediate sized disc bulge and mild facet hypertrophy. No spinal canal stenosis. Mild right neural foraminal stenosis. L5-S1: Intermediate sized disc bulge with large right extraforaminal osteophyte. No spinal canal stenosis. No neural foraminal stenosis. Visualized sacrum: Normal. IMPRESSION: 1. No acute abnormality of the thoracic or lumbar spine. 2. Moderate spinal canal stenosis and severe bilateral neural foraminal stenosis at L3-L4 due to combination of disc bulge and facet arthrosis. 3. Mild right L4-5 neural foraminal stenosis. 4. Small pleural effusions. 5. Widespread degenerative disc disease with multilevel type 2 Modic signal change. Electronically Signed   By: Deatra Robinson M.D.   On: 04/12/2023 23:51   MR THORACIC SPINE WO CONTRAST  Result Date: 04/12/2023 CLINICAL DATA:  Acute myelopathy EXAM: MRI THORACIC AND LUMBAR SPINE WITHOUT CONTRAST TECHNIQUE: Multiplanar and multiecho pulse sequences of the thoracic and lumbar spine were obtained without intravenous contrast. COMPARISON:  None Available. FINDINGS: MRI THORACIC SPINE FINDINGS Alignment:  Physiologic. Vertebrae: The heterogeneous bone marrow signal without discrete lesion. There are Modic type 2 endplate signal changes at T8-9, T10-11 and T11-12. No acute fracture. Cord:  Normal signal and morphology. Paraspinal and other soft tissues: Small pleural effusions Disc levels: There is no spinal canal stenosis or neural impingement. No thoracic disc herniation. MRI LUMBAR SPINE FINDINGS Segmentation:  Standard. Alignment:  Grade 1 anterolisthesis at L3-4 Vertebrae: No fracture, evidence of  discitis, or bone lesion. There are type 2 Modic signal changes at all levels. Schmorl's nodes are most prominent at L1 L2 and L3. There is mild degenerative height loss at L4. Conus medullaris and cauda equina: Conus extends to the L1 level. Conus and cauda equina appear normal. Paraspinal and other soft tissues: Negative. Disc levels: L1-L2: Normal disc space and facet joints. No spinal canal stenosis. No neural foraminal stenosis. L2-L3: Small disc bulge and mild facet hypertrophy. No spinal canal stenosis. No neural foraminal stenosis. L3-L4: Large disc bulge and moderate facet hypertrophy. Moderate spinal canal stenosis. Severe bilateral neural foraminal stenosis. L4-L5: Intermediate sized disc bulge and mild facet hypertrophy. No spinal canal  stenosis. Mild right neural foraminal stenosis. L5-S1: Intermediate sized disc bulge with large right extraforaminal osteophyte. No spinal canal stenosis. No neural foraminal stenosis. Visualized sacrum: Normal. IMPRESSION: 1. No acute abnormality of the thoracic or lumbar spine. 2. Moderate spinal canal stenosis and severe bilateral neural foraminal stenosis at L3-L4 due to combination of disc bulge and facet arthrosis. 3. Mild right L4-5 neural foraminal stenosis. 4. Small pleural effusions. 5. Widespread degenerative disc disease with multilevel type 2 Modic signal change. Electronically Signed   By: Deatra Robinson M.D.   On: 04/12/2023 23:51   CT HEAD WO CONTRAST ( )  Result Date: 04/12/2023 CLINICAL DATA:  Leg pain and weakness, initial encounter EXAM: CT HEAD WITHOUT CONTRAST TECHNIQUE: Contiguous axial images were obtained from the base of the skull through the vertex without intravenous contrast. RADIATION DOSE REDUCTION: This exam was performed according to the departmental dose-optimization program which includes automated exposure control, adjustment of the mA and/or kV according to patient size and/or use of iterative reconstruction technique. COMPARISON:   11/09/2022 FINDINGS: Brain: No evidence of acute infarction, hemorrhage, hydrocephalus, extra-axial collection or mass lesion/mass effect. Vascular: No hyperdense vessel or unexpected calcification. Skull: Normal. Negative for fracture or focal lesion. Sinuses/Orbits: No acute finding. Other: None. IMPRESSION: No acute abnormality noted. Electronically Signed   By: Alcide Clever M.D.   On: 04/12/2023 23:38   [name] CONTRAST  Result Date: 04/12/2023 CLINICAL DATA:  Acute neurologic deficit.  Left leg weakness. EXAM: MRI HEAD WITHOUT CONTRAST TECHNIQUE: Multiplanar, multiecho pulse sequences of the brain and surrounding structures were obtained without intravenous contrast. COMPARISON:  07/10/2011 FINDINGS: Brain: No acute infarct. No acute hemorrhage or extra-axial collection. Small amount of chronic blood products at the anterior right convexity. There is multifocal hyperintense T2-weighted signal within the periventricular and deep white matter. Mild volume loss. No hydrocephalus. The major midline structures are normal. Vascular: Normal flow voids. Skull and upper cervical spine: Normal marrow signal. Sinuses/Orbits: Negative. Other: None. IMPRESSION: 1. No acute intracranial abnormality. 2. Findings of chronic microvascular ischemia. Electronically Signed   By: Deatra Robinson M.D.   On: 04/12/2023 23:23   DG Chest Portable 1 View  Result Date: 04/12/2023 CLINICAL DATA:  Pacemaker EXAM: PORTABLE CHEST 1 VIEW COMPARISON:  03/29/2023. FINDINGS: Unremarkable cardiac silhouette. Right-sided hemodialysis catheter tip at the RA/SVC junction. Alveolar opacity left base consistent with an early pneumonia or volume loss. Calcified aorta. No pneumothorax. No pleural effusion. IMPRESSION: Early/mild left base consolidation or volume loss. No pacemaker is seen. Electronically Signed   By: Layla Maw M.D.   On: 04/12/2023 19:40    EKG: Independently reviewed.   Assessment and Plan: * Acute hip pain,  left Pt nonambulatory at baseline for a number of months, uses wheelchair at baseline.  Inability to move L leg/hip (due to pain) is new today however he reports. Was just in hospital with a 3cm L ischial abscess s/p IR drainage and ABx.  Cultures never grew out an organism per DC summary. Obviously, concerned that recent abscess is to blame for todays presentation + SIRS Got cefepime + Vanc in ED Check procalcitonin Getting CT w/o contrast of L hip to see if abscess reoccurred or other evidence of infection in that area. Check repeat CBC in AM  SIRS (systemic inflammatory response syndrome) (HCC) WBC 16.9k, RVR with his A.Fib. EDP ? PNA but no SOB, no productive cough. Im more concerned about repeat of infection in L hip area BCx pending Procalcitonin pending CT L  hip pending. Got single dose of cefepime + vanc in ED, will defer re-ordering these for the moment while the above work up is still in progress  Paroxysmal atrial fibrillation with RVR (HCC) Cont eliquis 2.5 BID, recently restarted on med after being held this past week for GIB. Cont amiodarone Looks like he's on digoxin started during this past admit for a.fib RVR rate control as well (despite being a dialysis patient), does look like nephrology is aware of digoxin use. Will continue this for now Consider switching to another med if at all possible.  ESRD on hemodialysis (HCC) Call nephro in AM for routine dialysis while in hospital. Continue midodrine for chronic hypotension.  History of cholecystitis Has cholecystostomy tube in place, changed out regularly at [facility]. Saw gen surg on 9/11 to consider cholecystectomy, they felt risk would be prohibitive though.  History of GI bleed History of recent GIB, no evidence of current bleeding and HGB is actually up to 10.2 from 9.x on discharge a couple of days ago.  This despite restarting a low dose of eliquis. Keep close eye on HGB during hospital stay given risk of  recurrent bleeding. Continuing eliquis low dose for now.      Advance Care Planning:   Code Status: Do not attempt resuscitation (DNR) PRE-ARREST INTERVENTIONS DESIRED  Consults: None  Family Communication: No family in room  Severity of Illness: The appropriate patient status for this patient is OBSERVATION. Observation status is judged to be reasonable and necessary in order to provide the required intensity of service to ensure the patient's safety. The patient's presenting symptoms, physical exam findings, and initial radiographic and laboratory data in the context of their medical condition is felt to place them at decreased risk for further clinical deterioration. Furthermore, it is anticipated that the patient will be medically stable for discharge from the hospital within 2 midnights of admission.   Author: Hillary Bow., DO 04/13/2023 3:51 AM  For on call review [email or web address].

## 2023-04-13 NOTE — Consult Note (Incomplete)
Renal Service Consult Note [facility]  Justin [name] 04/13/2023 Justin Krabbe, MD Requesting Physician: Dr. Nelson [name]  Reason for Consult: ESRD pt w/ leg hip pain HPI: The patient is a 77 y.o. year-old w/ PMH as below who was presented 9/30 w/ c/o L groin pain on the L. Cannot move the leg due to pain. In ED noted to have SORS w/ wbc 16k, afib in RVR. Missed HD Monday. admitted 10/01 today for sepsis secondary to acute L hip osteomyelitis w/ GNR bacteremia.  Pt started on IV abx. We are asked to see for dialysis.   Pt seen in room. No c/o's today. In good spirits.   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) 12/07/2019   Dialysis patient [facility])    ESRD (end stage renal disease) on dialysis (HCC)    Gout    Hypertension    Hypotension 06/13/2022   Right posterior capsular opacification 02/27/2021   Septic shock (HCC)    Vitreous hemorrhage of right eye (HCC) 10/24/2019   Past Surgical History  Past Surgical History:  Procedure Laterality Date   A/V FISTULAGRAM Left 08/17/2017   Procedure: A/V FISTULAGRAM;  Surgeon: Renford Dills, MD;  Location: [facility] INVASIVE CV LAB;  Service: Cardiovascular;  Laterality: Left;   A/V SHUNT INTERVENTION N/A 08/17/2017   Procedure: A/V SHUNT INTERVENTION;  Surgeon: Renford Dills, MD;  Location: [facility] INVASIVE CV LAB;  Service: Cardiovascular;  Laterality: N/A;   AV FISTULA PLACEMENT Left 05/31/2015   Procedure: ARTERIOVENOUS (AV) FISTULA CREATION;   Surgeon: Renford Dills, MD;  Location: [facility] ORS;  Service: Vascular;  Laterality: Left;   COLONOSCOPY N/A 12/17/2022   Procedure: COLONOSCOPY;  Surgeon: Napoleon Form, MD;  Location: [facility] ENDOSCOPY;  Service: Gastroenterology;  Laterality: N/A;   COLONOSCOPY WITH PROPOFOL N/A 09/11/2022   Procedure: COLONOSCOPY WITH PROPOFOL;  Surgeon: Imogene Burn, MD;  Location: [facility] ENDOSCOPY;  Service: Gastroenterology;  Laterality: N/A;   COLONOSCOPY WITH PROPOFOL N/A 09/17/2022   Procedure: COLONOSCOPY WITH PROPOFOL;  Surgeon: Benancio Deeds, MD;  Location: [facility] ENDOSCOPY;  Service: Gastroenterology;  Laterality: N/A;   ESOPHAGOGASTRODUODENOSCOPY (EGD) WITH PROPOFOL N/A 09/11/2022   Procedure: ESOPHAGOGASTRODUODENOSCOPY (EGD) WITH PROPOFOL;  Surgeon: Imogene Burn, MD;  Location: [facility] ENDOSCOPY;  Service: Gastroenterology;  Laterality: N/A;   ESOPHAGOGASTRODUODENOSCOPY (EGD) WITH PROPOFOL N/A 12/17/2022   Procedure: ESOPHAGOGASTRODUODENOSCOPY (EGD) WITH PROPOFOL;  Surgeon: Napoleon Form, MD;  Location: [facility] ENDOSCOPY;  Service: Gastroenterology;  Laterality: N/A;   excision bone spurs Bilateral 1989   feet   GIVENS CAPSULE STUDY N/A 11/10/2022   Procedure: GIVENS CAPSULE STUDY;  Surgeon: Shellia Cleverly, DO;  Location: [facility] ENDOSCOPY;  Service: Gastroenterology;  Laterality: N/A;   HEMOSTASIS CLIP PLACEMENT  09/11/2022   Procedure: HEMOSTASIS CLIP PLACEMENT;  Surgeon: Imogene Burn, MD;  Location: [facility] ENDOSCOPY;  Service: Gastroenterology;;   HEMOSTASIS CLIP PLACEMENT  09/17/2022   Procedure: HEMOSTASIS CLIP PLACEMENT;  Surgeon: Benancio Deeds, MD;  Location: [facility] ENDOSCOPY;  Service: Gastroenterology;;   HEMOSTASIS CLIP PLACEMENT  12/17/2022   Procedure: HEMOSTASIS CLIP PLACEMENT;  Surgeon: Napoleon Form, MD;  Location: [facility] ENDOSCOPY;  Service: Gastroenterology;;   HOT HEMOSTASIS N/A 09/11/2022   Procedure: HOT HEMOSTASIS (ARGON PLASMA COAGULATION/BICAP);  Surgeon: Imogene Burn, MD;  Location: [facility]  ENDOSCOPY;  Service: Gastroenterology;  Laterality: N/A;   HOT HEMOSTASIS N/A 09/17/2022   Procedure: HOT HEMOSTASIS (ARGON PLASMA COAGULATION/BICAP);  Surgeon: Benancio Deeds, MD;  Location: [facility] ENDOSCOPY;  Service: Gastroenterology;  Laterality: N/A;   INCISION AND DRAINAGE PERIRECTAL ABSCESS N/A 06/15/2022   Procedure: IRRIGATION AND DEBRIDEMENT PERIRECTAL ABSCESS;  Surgeon: Franky Macho, MD;  Location: [facility] ORS;  Service: General;  Laterality: N/A;   IR EXCHANGE BILIARY DRAIN  03/16/2023   KNEE SURGERY Left 1998   arthroscopy   POLYPECTOMY  09/11/2022   Procedure: POLYPECTOMY;  Surgeon: Imogene Burn, MD;  Location: [facility] ENDOSCOPY;  Service: Gastroenterology;;   SHOULDER SURGERY Left 1994   rotator cuff   Family History  Family History  Problem Relation Age of Onset   Kidney disease Mother    Alcoholism Father    Alcoholism Brother    Colon cancer Neg Hx    Stomach cancer Neg Hx    Esophageal cancer Neg Hx    Social History  reports that he quit smoking about 44 years ago. His smoking use included cigarettes. He has never used smokeless tobacco. He reports that he does not drink alcohol and does not use drugs. Allergies No Known Allergies Home medications Prior to Admission medications   Medication Sig Start Date End Date Taking? Authorizing Provider  acetaminophen (TYLENOL) 325 MG tablet Take 2 tablets (650 mg total) by mouth every 6 (six) hours as needed for mild pain (or Fever >/= 101). 11/24/22  Yes Pokhrel, Laxman, MD  amiodarone (PACERONE) 200 MG tablet Take 1 tablet (200 mg total) by mouth daily. 04/09/23  Yes Burnadette Pop, MD  apixaban (ELIQUIS) 2.5 MG TABS tablet Take 1 tablet (2.5 mg total) by mouth 2 (two) times daily. 04/08/23  Yes Burnadette Pop, MD  atorvastatin (LIPITOR) 40 MG tablet Take 40 mg by mouth at bedtime. 10/30/14  Yes [provider]  calcitRIOL (ROCALTROL) 0.25 MCG capsule Take 3 capsules (0.75 mcg total) by mouth every Monday, Wednesday, and Friday  with hemodialysis. Patient taking differently: Take 0.25 mcg by mouth daily. 11/25/22  Yes Pokhrel, Laxman, MD  Digoxin 62.5 MCG TABS Take 1 tablet by mouth every other day. 04/10/23  Yes [name], MD  midodrine (PROAMATINE) 10 MG tablet Take 2 tablets (20 mg total) by mouth 3 (three) times daily. 04/08/23  Yes Burnadette Pop, MD  pantoprazole (PROTONIX) 40 MG tablet Take 1 tablet (40 mg total) by mouth 2 (two) times daily before a meal. Patient taking differently: Take 40 mg by mouth daily. 11/24/22  Yes Pokhrel, Laxman, MD  pregabalin (LYRICA) 75 MG capsule Take 1 capsule (75 mg total) by mouth at bedtime. Patient taking differently: Take 75 mg by mouth 2 (two) times daily. 11/24/22 11/24/23 Yes Pokhrel, Laxman, MD  cinacalcet (SENSIPAR) 30 MG tablet Take 30 mg by mouth daily. Patient not taking: Reported on 04/13/2023 08/25/22   [provider]  sevelamer carbonate (RENVELA) 800 MG tablet Take 800 mg by mouth 2 (two) times daily. Patient not taking: Reported on 03/29/2023    [provider]     Vitals:   04/13/23 1230 04/13/23 1300 04/13/23 1330 04/13/23 1613  BP: 122/77 (!) 115/51 121/68  118/77  Pulse:   100 (!) 103  Resp: 13 16 (!) 8 16  Temp:    98.3 F (36.8 C)  TempSrc:    Oral  SpO2:   97% 100%  Weight:      Height:       Exam Gen alert, no distress No rash, cyanosis or gangrene Sclera anicteric, throat clear  No jvd or bruits Chest clear bilat to bases, no rales/ wheezing RRR no MRG Abd soft ntnd no mass or ascites +bs GU normal male MS no joint effusions or deformity Ext no LE edema, no wounds or ulcers Neuro is alert, Ox 3 , nf    RIJ TDC intact      Renal-related home meds: - rocaltrol 0.25 mcg po every day - sensipar 30 every day - midodrine 20 tid - lyrica 75 bid - renvela 800 bid ac    [facility] HD: [facility]  3h  450/1.5    82kg  2/2.5 bath  TDC   Heparin none - last OP HD 9/27, post wt 82.3kg  CXR 9/30 - no active  disease  Assessment/ Plan: GNR bacteremia / acute L hip osteomyelitis/ hip pain - on IV abx and ID consulting. Pt has TDC in place, will follow and if line holiday needed will do over the weekend.  Afib / RVR - per pmd ESRD - on HD MWF. Missed HD Monday. Plan HD today/ this evening.  BP/ chronic hypotension - takes midodrine 20 tid at home, cont here. BP's low normal. At dry wt.  Volume - at dry wt, no edema per cXR, euvolemic on exam.  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 Cont sensipar, po vdra and renvela as binder.  H/o cholecystitis - has chole tube changed out by [facility] regularly    Vinson Moselle  MD [facility] 04/13/2023, 4:57 PM  Recent Labs  Lab 04/07/23 1024 04/12/23 1837 04/13/23 0601  HGB 9.4* 10.2* 9.3*  ALBUMIN 2.0*  --   --   CALCIUM 8.0* 9.1 8.8*  PHOS 3.3  --   --   CREATININE 7.20* 8.57* 8.94*  K 3.4* 4.2 4.0   Inpatient medications:  amiodarone  200 mg Oral Daily   apixaban  2.5 mg Oral BID   atorvastatin  40 mg Oral QHS   calcitRIOL  0.25 mcg Oral Daily   Chlorhexidine Gluconate Cloth  6 each Topical Daily   digoxin  0.0625 mg Oral QODAY   midodrine  20 mg Oral TID WC   pregabalin  75 mg Oral BID    cefTRIAXone (ROCEPHIN)  IV     metronidazole Stopped (04/13/23 0740)   acetaminophen **OR** acetaminophen, LORazepam, LORazepam

## 2023-04-13 NOTE — Consult Note (Signed)
[facility]  Total days of antibiotics 2 Reason for Consult: deep tissue infection    Referring Physician: amin  Principal Problem:   Acute hip pain, left Active Problems:   ESRD on hemodialysis (HCC)   Paroxysmal atrial fibrillation with RVR (HCC)   History of GI bleed   SIRS (systemic inflammatory response syndrome) (HCC)   Ambulatory dysfunction   History of cholecystitis   Dysphagia    HPI: Justin [name] is a 77 y.o. male wih hx of ESRD on HD, pAfib, hx of cholecystitis with perc drain, and rectal abscess s/p IR drain on recent hospitalization from 9/16-9/26 which he completed abtx course when he was hospitalized. HE now started to have worsening groin pain on 9/29 and had difficulty with movling his left leg. He was brought back to the ED for evaluation. Labs are noteable for leukocytosis of 17K. He was re-admitted for SIRS and concern for partially treated rectal abscess. He denies fever or chills. He was restarted on empiric abtx of vanco, cefepime and metronidazole. His infectious work up has isolated kleb pneumoniae bacteremia.  Imaging showing a 3.3cm fluid collection/abscess by left ischial tuberosity, concerning for abscess/osteo. 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) 12/07/2019   Dialysis patient (HCC)    ESRD (end stage renal disease) on dialysis (HCC)    Gout    Hypertension    Hypotension 06/13/2022   Right posterior capsular opacification 02/27/2021   Septic shock (HCC)    Vitreous hemorrhage of right eye (HCC) 10/24/2019     Allergies: No Known Allergies  Current antibiotics:   MEDICATIONS:  amiodarone  200 mg Oral Daily   apixaban  2.5 mg Oral BID   atorvastatin  40 mg Oral QHS   calcitRIOL  0.25 mcg Oral Daily   Chlorhexidine Gluconate Cloth  6 each Topical Daily   digoxin  0.0625 mg Oral QODAY   midodrine  20 mg Oral TID WC   pregabalin  75 mg Oral BID    Social History   Tobacco Use   Smoking status: Former    Current packs/day: 0.00    Types: Cigarettes    Quit date: 10/01/1978    Years since quitting: 44.5   Smokeless tobacco: Never  Vaping Use   Vaping status: Never Used  Substance Use Topics   Alcohol use: No   Drug use: No    Family History  Problem Relation Age of Onset   Kidney disease Mother    Alcoholism Father    Alcoholism Brother    Colon cancer Neg Hx    Stomach cancer Neg Hx    Esophageal cancer Neg Hx     Review of Systems -  12 point ros is negative except what is mentioned above  OBJECTIVE: Temp:  [97.7 F (36.5 C)-98.3 F (36.8 C)] 98.3 F (36.8 C) (10/01 1613) Pulse Rate:  [58-115] 103 (10/01 1613) Resp:  [8-25] 16 (10/01 1613) BP: (97-150)/(47-106) 118/77 (10/01 1613) SpO2:  [58 %-100 %] 100 % (10/01 1613) Weight:  [81.2 kg] 81.2 kg (09/30 2002) Physical  Exam  Constitutional: He is oriented to person, place, and time. He appears well-developed and well-nourished. No distress.  HENT:  Mouth/Throat: Oropharynx is clear and moist. No oropharyngeal exudate.  Cardiovascular: Normal rate, regular rhythm and normal heart sounds. Exam reveals no gallop and no friction rub.  No murmur heard.  Chest wall : no pain about hd line Pulmonary/Chest: Effort normal and breath sounds normal. No respiratory distress. He has no wheezes.  Abdominal: Soft. Bowel sounds are normal. He exhibits no distension. There is no tenderness.  Lymphadenopathy:  He has no cervical adenopathy.  Neurological: He is alert and oriented to person, place, and time.  Skin: Skin is  warm and dry. No rash noted. No erythema.  Psychiatric: He has a normal mood and affect. His behavior is normal.    LABS: Results for orders placed or performed during the hospital encounter of 04/12/23 (from the past 48 hour(s))  C-reactive protein     Status: Abnormal   Collection Time: 04/12/23  6:37 PM  Result Value Ref Range   CRP 14.3 (H) <1.0 mg/dL    Comment: Performed at [facility], [address], [place], [place] [address]  Basic metabolic panel     Status: Abnormal   Collection Time: 04/12/23  6:37 PM  Result Value Ref Range   Sodium 139 135 - 145 mmol/L   Potassium 4.2 3.5 - 5.1 mmol/L   Chloride 100 98 - 111 mmol/L   CO2 23 22 - 32 mmol/L   Glucose, Bld 89 70 - 99 mg/dL    Comment: Glucose reference range applies only to samples taken after fasting for at least 8 hours.   BUN 45 (H) 8 - 23 mg/dL   Creatinine, Ser 6.04 (H) 0.61 - 1.24 mg/dL   Calcium 9.1 8.9 - 54.0 mg/dL   GFR, Estimated 6 (L) >60 mL/min    Comment: (NOTE) Calculated using the CKD-EPI Creatinine Equation (2021)    Anion gap 16 (H) 5 - 15    Comment: Performed at [facility], [address], [place], [place] [address]  CBC     Status: Abnormal   Collection Time: 04/12/23  6:37 PM  Result Value Ref Range   WBC 16.9 (H) 4.0 - 10.5 K/uL   RBC 3.30 (L) 4.22 - 5.81 MIL/uL   Hemoglobin 10.2 (L) 13.0 - 17.0 g/dL   HCT 14.7 (L) 82.9 - 56.2 %   MCV 96.7 80.0 - 100.0 fL   MCH 30.9 26.0 - 34.0 pg   MCHC 32.0 30.0 - 36.0 g/dL   RDW 13.0 (H) 86.5 - 78.4 %   Platelets 204 150 - 400 K/uL    Comment: REPEATED TO VERIFY   nRBC 0.0 0.0 - 0.2 %    Comment: Performed at [facility], [address] [place], [place] [address]  CBG monitoring, ED     Status: None   Collection Time: 04/12/23  6:44 PM  Result Value Ref Range   Glucose-Capillary 87 70 - 99 mg/dL    Comment: Glucose reference range applies only to samples taken after fasting for at least 8 hours.  Sedimentation rate      Status: Abnormal   Collection Time: 04/12/23  7:07 PM  Result Value Ref Range   Sed Rate 65 (H) 0 - 16 mm/hr    Comment: Performed at [facility], [address], [place], [place] [address]  Blood culture (routine x 2)  Status: None (Preliminary result)   Collection Time: 04/12/23  7:08 PM   Specimen: BLOOD RIGHT HAND  Result Value Ref Range   Specimen Description BLOOD RIGHT HAND    Special Requests      BOTTLES DRAWN AEROBIC AND ANAEROBIC Blood Culture adequate volume   Culture  Setup Time      GRAM NEGATIVE RODS IN BOTH AEROBIC AND ANAEROBIC BOTTLES Organism ID to follow CRITICAL RESULT CALLED TO, READ BACK BY AND VERIFIED WITH: BTilda Burrow PHARMD, AT 1041 04/13/23 Renato Shin Performed at [facility], [address], [place], [place] [address]    Culture GRAM NEGATIVE RODS    Report Status PENDING   Blood Culture ID Panel (Reflexed)     Status: Abnormal   Collection Time: 04/12/23  7:08 PM  Result Value Ref Range   Enterococcus faecalis NOT DETECTED NOT DETECTED   Enterococcus Faecium NOT DETECTED NOT DETECTED   Listeria monocytogenes NOT DETECTED NOT DETECTED   Staphylococcus species NOT DETECTED NOT DETECTED   Staphylococcus aureus (BCID) NOT DETECTED NOT DETECTED   Staphylococcus epidermidis NOT DETECTED NOT DETECTED   Staphylococcus lugdunensis NOT DETECTED NOT DETECTED   Streptococcus species NOT DETECTED NOT DETECTED   Streptococcus agalactiae NOT DETECTED NOT DETECTED   Streptococcus pneumoniae NOT DETECTED NOT DETECTED   Streptococcus pyogenes NOT DETECTED NOT DETECTED   A.calcoaceticus-baumannii NOT DETECTED NOT DETECTED   Bacteroides fragilis NOT DETECTED NOT DETECTED   Enterobacterales DETECTED (A) NOT DETECTED    Comment: Enterobacterales represent a large order of gram negative bacteria, not a single organism. CRITICAL RESULT CALLED TO, READ BACK BY AND VERIFIED WITH: B. AGEE PHARMD, AT 1041 04/13/23 D. VANHOOK    Enterobacter cloacae complex NOT  DETECTED NOT DETECTED   Escherichia coli NOT DETECTED NOT DETECTED   Klebsiella aerogenes NOT DETECTED NOT DETECTED   Klebsiella oxytoca NOT DETECTED NOT DETECTED   Klebsiella pneumoniae DETECTED (A) NOT DETECTED    Comment: CRITICAL RESULT CALLED TO, READ BACK BY AND VERIFIED WITH: B. AGEE PHARMD, AT 1041 04/13/23 D. VANHOOK    Proteus species NOT DETECTED NOT DETECTED   Salmonella species NOT DETECTED NOT DETECTED   Serratia marcescens NOT DETECTED NOT DETECTED   Haemophilus influenzae NOT DETECTED NOT DETECTED   Neisseria meningitidis NOT DETECTED NOT DETECTED   Pseudomonas aeruginosa NOT DETECTED NOT DETECTED   Stenotrophomonas maltophilia NOT DETECTED NOT DETECTED   Candida albicans NOT DETECTED NOT DETECTED   Candida auris NOT DETECTED NOT DETECTED   Candida glabrata NOT DETECTED NOT DETECTED   Candida krusei NOT DETECTED NOT DETECTED   Candida parapsilosis NOT DETECTED NOT DETECTED   Candida tropicalis NOT DETECTED NOT DETECTED   Cryptococcus neoformans/gattii NOT DETECTED NOT DETECTED   CTX-M ESBL NOT DETECTED NOT DETECTED   Carbapenem resistance IMP NOT DETECTED NOT DETECTED   Carbapenem resistance KPC NOT DETECTED NOT DETECTED   Carbapenem resistance NDM NOT DETECTED NOT DETECTED   Carbapenem resist OXA 48 LIKE NOT DETECTED NOT DETECTED   Carbapenem resistance VIM NOT DETECTED NOT DETECTED    Comment: Performed at [facility], [address], [place], [place] [address]  Blood culture (routine x 2)     Status: None (Preliminary result)   Collection Time: 04/12/23  7:38 PM   Specimen: BLOOD  Result Value Ref Range   Specimen Description BLOOD SITE NOT SPECIFIED    Special Requests      BOTTLES DRAWN AEROBIC AND ANAEROBIC Blood Culture adequate volume   Culture  Setup Time      GRAM NEGATIVE RODS IN BOTH AEROBIC AND ANAEROBIC BOTTLES CRITICAL RESULT CALLED TO, READ BACK BY AND VERIFIED WITH: BTilda Burrow PHARMD, AT 1041 04/13/23 D. Leighton Roach Performed at [facility], [address], [place], [place] [address]    Culture GRAM NEGATIVE RODS    Report Status PENDING   CBC     Status: Abnormal   Collection Time: 04/13/23  6:01 AM  Result Value Ref Range   WBC 14.9 (H) 4.0 - 10.5 K/uL   RBC 3.12 (L) 4.22 - 5.81 MIL/uL   Hemoglobin 9.3 (L) 13.0 - 17.0 g/dL   HCT 19.1 (L) 47.8 - 29.5 %   MCV 96.8 80.0 - 100.0 fL   MCH 29.8 26.0 - 34.0 pg   MCHC 30.8 30.0 - 36.0 g/dL   RDW 62.1 (H) 30.8 - 65.7 %   Platelets 199 150 - 400 K/uL   nRBC 0.0 0.0 - 0.2 %    Comment: Performed at [facility], [address] [place], [place] [address]  Basic metabolic panel     Status: Abnormal   Collection Time: 04/13/23  6:01 AM  Result Value Ref Range   Sodium 139 135 - 145 mmol/L   Potassium 4.0 3.5 - 5.1 mmol/L   Chloride 103 98 - 111 mmol/L   CO2 22 22 - 32 mmol/L   Glucose, Bld 72 70 - 99 mg/dL    Comment: Glucose reference range applies only to samples taken after fasting for at least 8 hours.   BUN 51 (H) 8 - 23 mg/dL   Creatinine, Ser 2.95 (H) 0.61 - 1.24 mg/dL   Calcium 8.8 (L) 8.9 - 10.3 mg/dL   GFR, Estimated 6 (L) >60 mL/min    Comment: (NOTE) Calculated using the CKD-EPI Creatinine Equation (2021)    Anion gap 14 5 - 15    Comment: Performed at [facility], [address], [place], [place] [address]  Procalcitonin     Status: None   Collection Time: 04/13/23  6:01 AM  Result Value Ref Range   Procalcitonin 7.08 ng/mL    Comment:        Interpretation: PCT > 2 ng/mL: Systemic infection (sepsis) is likely, unless other causes are known. (NOTE)       Sepsis PCT Algorithm           Lower Respiratory Tract                                      Infection PCT Algorithm    ----------------------------     ----------------------------         PCT < 0.25 ng/mL                PCT < 0.10 ng/mL          Strongly encourage             Strongly discourage   discontinuation of antibiotics    initiation of antibiotics     ----------------------------     -----------------------------       PCT 0.25 - 0.50 ng/mL            PCT 0.10 - 0.25 ng/mL               OR       >80% decrease in PCT  Discourage initiation of                                            antibiotics      Encourage discontinuation           of antibiotics    ----------------------------     -----------------------------         PCT >= 0.50 ng/mL              PCT 0.26 - 0.50 ng/mL               AND       <80% decrease in PCT              Encourage initiation of                                             antibiotics       Encourage continuation           of antibiotics    ----------------------------     -----------------------------        PCT >= 0.50 ng/mL                  PCT > 0.50 ng/mL               AND         increase in PCT                  Strongly encourage                                      initiation of antibiotics    Strongly encourage escalation           of antibiotics                                     -----------------------------                                           PCT <= 0.25 ng/mL                                                 OR                                        > 80% decrease in PCT                                      Discontinue / Do not initiate  antibiotics  Performed at [facility], [address] [place], [place] [address]     MICRO: + blood cx on 9/30 IMAGING: CT HIP LEFT WO CONTRAST  Result Date: 04/13/2023 CLINICAL DATA:  Septic arthritis suspected in the left hip. EXAM: CT OF THE LEFT HIP WITHOUT CONTRAST TECHNIQUE: Multidetector CT imaging of the left hip was performed according to the standard protocol. Multiplanar CT image reconstructions were also generated. RADIATION DOSE REDUCTION: This exam was performed according to the departmental dose-optimization program which includes automated exposure control, adjustment of  the mA and/or kV according to patient size and/or use of iterative reconstruction technique. COMPARISON:  CT abdomen and pelvis and reconstructions recently 03/29/2023 FINDINGS: Bones/Joint/Cartilage Somewhat dense bones are again noted consistent with renal osteodystrophy. There is moderate superior joint space loss at the left hip with degenerative subcortical cystic changes of the superior acetabulum and small acetabular and femoral head osteophytes. No left hip joint effusion or joint surface destruction is seen. Narrowing and trace spurring of the pubic symphysis is again noted. 3.3 cm hypodense collection again underlies the left ischial tuberosity was previously much better delineated and most likely represents an abscess. There is cortical indistinctness overlying this collection suspicious for osteomyelitis. Ligaments Suboptimally assessed by CT. Muscles and Tendons There is mild atrophy of the gluteal and upper thigh musculature, mild volume loss in the floor of pelvis muscles. No acute tendon abnormality is seen. No intramuscular fluid collections. Soft tissues There is heavy iliofemoral calcific arterial plaque. Minimal ascites in the posterior deep pelvis again is noted. Enlarged prostate with fiducial markers. Bladder is nondistended but appears thickened which could be due to XRT cystitis or bacterial cystitis. There are small bilateral inguinal fat hernias. IMPRESSION: 1. 3.3 cm hypodense collection again underlies the left ischial tuberosity, most likely an abscess and was seen previously. There is cortical indistinctness overlying this collection suspicious for osteomyelitis. 2. Moderate left hip osteoarthritis. No left hip joint effusion or joint surface destruction. 3. Renal osteodystrophy. 4. Calcific arteriosclerosis. 5. Minimal ascites in the posterior deep pelvis. 6. Chronic enlarged prostate with fiducial markers. 7. Bladder wall thickening which could be due to nondistention, XRT or  bacterial cystitis. 8. Small bilateral inguinal fat hernias. Electronically Signed   By: Almira Bar M.D.   On: 04/13/2023 04:52   MR LUMBAR SPINE WO CONTRAST  Result Date: 04/12/2023 CLINICAL DATA:  Acute myelopathy EXAM: MRI THORACIC AND LUMBAR SPINE WITHOUT CONTRAST TECHNIQUE: Multiplanar and multiecho pulse sequences of the thoracic and lumbar spine were obtained without intravenous contrast. COMPARISON:  None Available. FINDINGS: MRI THORACIC SPINE FINDINGS Alignment:  Physiologic. Vertebrae: The heterogeneous bone marrow signal without discrete lesion. There are Modic type 2 endplate signal changes at T8-9, T10-11 and T11-12. No acute fracture. Cord:  Normal signal and morphology. Paraspinal and other soft tissues: Small pleural effusions Disc levels: There is no spinal canal stenosis or neural impingement. No thoracic disc herniation. MRI LUMBAR SPINE FINDINGS Segmentation:  Standard. Alignment:  Grade 1 anterolisthesis at L3-4 Vertebrae: No fracture, evidence of discitis, or bone lesion. There are type 2 Modic signal changes at all levels. Schmorl's nodes are most prominent at L1 L2 and L3. There is mild degenerative height loss at L4. Conus medullaris and cauda equina: Conus extends to the L1 level. Conus and cauda equina appear normal. Paraspinal and other soft tissues: Negative. Disc levels: L1-L2: Normal disc space and facet joints. No spinal canal stenosis. No neural foraminal stenosis. L2-L3: Small disc bulge and  mild facet hypertrophy. No spinal canal stenosis. No neural foraminal stenosis. L3-L4: Large disc bulge and moderate facet hypertrophy. Moderate spinal canal stenosis. Severe bilateral neural foraminal stenosis. L4-L5: Intermediate sized disc bulge and mild facet hypertrophy. No spinal canal stenosis. Mild right neural foraminal stenosis. L5-S1: Intermediate sized disc bulge with large right extraforaminal osteophyte. No spinal canal stenosis. No neural foraminal stenosis. Visualized  sacrum: Normal. IMPRESSION: 1. No acute abnormality of the thoracic or lumbar spine. 2. Moderate spinal canal stenosis and severe bilateral neural foraminal stenosis at L3-L4 due to combination of disc bulge and facet arthrosis. 3. Mild right L4-5 neural foraminal stenosis. 4. Small pleural effusions. 5. Widespread degenerative disc disease with multilevel type 2 Modic signal change. Electronically Signed   By: Deatra Robinson M.D.   On: 04/12/2023 23:51   MR THORACIC SPINE WO CONTRAST  Result Date: 04/12/2023 CLINICAL DATA:  Acute myelopathy EXAM: MRI THORACIC AND LUMBAR SPINE WITHOUT CONTRAST TECHNIQUE: Multiplanar and multiecho pulse sequences of the thoracic and lumbar spine were obtained without intravenous contrast. COMPARISON:  None Available. FINDINGS: MRI THORACIC SPINE FINDINGS Alignment:  Physiologic. Vertebrae: The heterogeneous bone marrow signal without discrete lesion. There are Modic type 2 endplate signal changes at T8-9, T10-11 and T11-12. No acute fracture. Cord:  Normal signal and morphology. Paraspinal and other soft tissues: Small pleural effusions Disc levels: There is no spinal canal stenosis or neural impingement. No thoracic disc herniation. MRI LUMBAR SPINE FINDINGS Segmentation:  Standard. Alignment:  Grade 1 anterolisthesis at L3-4 Vertebrae: No fracture, evidence of discitis, or bone lesion. There are type 2 Modic signal changes at all levels. Schmorl's nodes are most prominent at L1 L2 and L3. There is mild degenerative height loss at L4. Conus medullaris and cauda equina: Conus extends to the L1 level. Conus and cauda equina appear normal. Paraspinal and other soft tissues: Negative. Disc levels: L1-L2: Normal disc space and facet joints. No spinal canal stenosis. No neural foraminal stenosis. L2-L3: Small disc bulge and mild facet hypertrophy. No spinal canal stenosis. No neural foraminal stenosis. L3-L4: Large disc bulge and moderate facet hypertrophy. Moderate spinal canal  stenosis. Severe bilateral neural foraminal stenosis. L4-L5: Intermediate sized disc bulge and mild facet hypertrophy. No spinal canal stenosis. Mild right neural foraminal stenosis. L5-S1: Intermediate sized disc bulge with large right extraforaminal osteophyte. No spinal canal stenosis. No neural foraminal stenosis. Visualized sacrum: Normal. IMPRESSION: 1. No acute abnormality of the thoracic or lumbar spine. 2. Moderate spinal canal stenosis and severe bilateral neural foraminal stenosis at L3-L4 due to combination of disc bulge and facet arthrosis. 3. Mild right L4-5 neural foraminal stenosis. 4. Small pleural effusions. 5. Widespread degenerative disc disease with multilevel type 2 Modic signal change. Electronically Signed   By: Deatra Robinson M.D.   On: 04/12/2023 23:51   CT HEAD WO CONTRAST ( )  Result Date: 04/12/2023 CLINICAL DATA:  Leg pain and weakness, initial encounter EXAM: CT HEAD WITHOUT CONTRAST TECHNIQUE: Contiguous axial images were obtained from the base of the skull through the vertex without intravenous contrast. RADIATION DOSE REDUCTION: This exam was performed according to the departmental dose-optimization program which includes automated exposure control, adjustment of the mA and/or kV according to patient size and/or use of iterative reconstruction technique. COMPARISON:  11/09/2022 FINDINGS: Brain: No evidence of acute infarction, hemorrhage, hydrocephalus, extra-axial collection or mass lesion/mass effect. Vascular: No hyperdense vessel or unexpected calcification. Skull: Normal. Negative for fracture or focal lesion. Sinuses/Orbits: No acute finding. Other: None. IMPRESSION: No acute abnormality noted.  Electronically Signed   By: Alcide Clever M.D.   On: 04/12/2023 23:38   [name] CONTRAST  Result Date: 04/12/2023 CLINICAL DATA:  Acute neurologic deficit.  Left leg weakness. EXAM: MRI HEAD WITHOUT CONTRAST TECHNIQUE: Multiplanar, multiecho pulse sequences of the brain and  surrounding structures were obtained without intravenous contrast. COMPARISON:  07/10/2011 FINDINGS: Brain: No acute infarct. No acute hemorrhage or extra-axial collection. Small amount of chronic blood products at the anterior right convexity. There is multifocal hyperintense T2-weighted signal within the periventricular and deep white matter. Mild volume loss. No hydrocephalus. The major midline structures are normal. Vascular: Normal flow voids. Skull and upper cervical spine: Normal marrow signal. Sinuses/Orbits: Negative. Other: None. IMPRESSION: 1. No acute intracranial abnormality. 2. Findings of chronic microvascular ischemia. Electronically Signed   By: Deatra Robinson M.D.   On: 04/12/2023 23:23   DG Chest Portable 1 View  Result Date: 04/12/2023 CLINICAL DATA:  Pacemaker EXAM: PORTABLE CHEST 1 VIEW COMPARISON:  03/29/2023. FINDINGS: Unremarkable cardiac silhouette. Right-sided hemodialysis catheter tip at the RA/SVC junction. Alveolar opacity left base consistent with an early pneumonia or volume loss. Calcified aorta. No pneumothorax. No pleural effusion. IMPRESSION: Early/mild left base consolidation or volume loss. No pacemaker is seen. Electronically Signed   By: Layla Maw M.D.   On: 04/12/2023 19:40     Assessment/Plan:  77yo M with recent history of hospitalized for left groin/ischial abscess is readmitted with SIRS, pelvic/groin pain now found to have klebsiella bacteremia  - recommend to continue cefepime and oral metronidazole. Can stop iv vancomycin - see if IR can drain fluid collection again vs. Surgery to do I x D - will check sed rate and crp - await sensitivity on culture results eSRD on hd - will renally dose abtx but may need to consider getting line holiday if further blood cx become positive  Leukocytosis = anticipate to improve with abtx therapy and source control

## 2023-04-13 NOTE — Progress Notes (Signed)
PROGRESS NOTE    Justin [name]  ONG:[id number] DOB: 08-03-1945 DOA: 04/12/2023 PCP: Pcp, No   Brief Narrative:  77 year old with history of ESRD on HD, GIB, rectal abscess, cholecystitis with percutaneous drain, DM2 diet-controlled, chronic hypotension on midodrine, ambulatory dysfunction/wheelchair-bound for the last 3-4 months was admitted to the hospital last month for ESRD and septic shock noting to have 3.5 cm ischial abscess drained by IR, cultures were negative completed antibiotic course in the hospital and was discharged on September 26.  Now presents back to the hospital again with persistent left groin pain.  IR reviewed images, no obvious area requiring drainage.  Currently on empiric antibiotics. ID consulted.    Assessment & Plan:  Principal Problem:   Acute hip pain, left Active Problems:   SIRS (systemic inflammatory response syndrome) (HCC)   ESRD on hemodialysis (HCC)   Paroxysmal atrial fibrillation with RVR (HCC)   History of GI bleed   Ambulatory dysfunction   History of cholecystitis    Sepsis secondary to acute left hip osteomyelitis causing pain GNR bacteremia.  At baseline he has been nonambulatory for the past several months.  CT scan of the left hip again shows small fluid collection but after reviewing by IR it is not sufficient for any sort of aspiration therefore recommending IV antibiotics.  ID consulted.     Paroxysmal atrial fibrillation with RVR (HCC) Continue amiodarone, digoxin, Eliquis.   ESRD on hemodialysis (HCC) Continue midodrine for chronic hypotension.  Nephrology consulted.   History of cholecystitis Has cholecystostomy tube in place, changed out regularly at [facility]. Saw gen surg on 9/11 to consider cholecystectomy, they felt risk would be prohibitive though.   History of GI bleed Anemia of chronic disease Prior history, no current evidence.  Will continue to closely monitor as patient is on Eliquis.   DVT prophylaxis:  Eliquis Code Status: DNR Family Communication:   Continue stay for IV antibiotics secondary to left hip osteomyelitis    Subjective:  Seen at bedside.  Feels ok.  Denies any open sores.   Examination:  General exam: Appears calm and comfortable  Respiratory system: Clear to auscultation. Respiratory effort normal. Cardiovascular system: S1 & S2 heard, RRR. No JVD, murmurs, rubs, gallops or clicks. No pedal edema. Gastrointestinal system: Abdomen is nondistended, soft and nontender. No organomegaly or masses felt. Normal bowel sounds heard. Central nervous system: Alert and oriented. No focal neurological deficits. Extremities: Symmetric 5 x 5 power. Skin: No rashes, lesions or ulcers Psychiatry: Judgement and insight appear normal. Mood & affect appropriate.      Diet Orders (From admission, onward)     Start     Ordered   04/13/23 0705  Diet NPO time specified Except for: Sips with Meds  Diet effective now       Question:  Except for  Answer:  Sips with Meds   04/13/23 0704            Objective: Vitals:   04/13/23 0300 04/13/23 0430 04/13/23 0601 04/13/23 0800  BP: (!) 140/64 (!) 118/106 (!) 112/96 114/72  Pulse: 74  94 (!) 115  Resp: 16 (!) 23 16 16   Temp:   98 F (36.7 C) 97.7 F (36.5 C)  TempSrc:   Oral   SpO2: 98%  97% 94%  Weight:      Height:        Intake/Output Summary (Last 24 hours) at 04/13/2023 4401 Last data filed at 04/13/2023 0428 Gross per 24 hour  Intake --  Output 175 ml  Net -175 ml   Filed Weights   04/12/23 2002  Weight: 81.2 kg    Scheduled Meds:  amiodarone  200 mg Oral Daily   apixaban  2.5 mg Oral BID   atorvastatin  40 mg Oral QHS   calcitRIOL  0.25 mcg Oral Daily   Digoxin  1 tablet Oral QODAY   midodrine  20 mg Oral TID WC   pregabalin  75 mg Oral BID   vancomycin variable dose per unstable renal function (pharmacist dosing)   Does not apply See admin instructions   Continuous Infusions:  ceFEPime (MAXIPIME) IV      metronidazole Stopped (04/13/23 0818)    Nutritional status     Body mass index is 25.68 kg/m.  Data Reviewed:   CBC: Recent Labs  Lab 04/07/23 1024 04/12/23 1837 04/13/23 0601  WBC 8.3 16.9* 14.9*  HGB 9.4* 10.2* 9.3*  HCT 30.1* 31.9* 30.2*  MCV 97.7 96.7 96.8  PLT 170 204 199   Basic Metabolic Panel: Recent Labs  Lab 04/07/23 1024 04/12/23 1837 04/13/23 0601  NA 137 139 139  K 3.4* 4.2 4.0  CL 100 100 103  CO2 28 23 22   GLUCOSE 127* 89 72  BUN 34* 45* 51*  CREATININE 7.20* 8.57* 8.94*  CALCIUM 8.0* 9.1 8.8*  PHOS 3.3  --   --    GFR: Estimated Creatinine Clearance: 6.9 mL/min (A) (by C-G formula based on SCr of 8.94 mg/dL (H)). Liver Function Tests: Recent Labs  Lab 04/07/23 1024  ALBUMIN 2.0*   No results for input(s): "LIPASE", "AMYLASE" in the last 168 hours. No results for input(s): "AMMONIA" in the last 168 hours. Coagulation Profile: No results for input(s): "INR", "PROTIME" in the last 168 hours. Cardiac Enzymes: No results for input(s): "CKTOTAL", "CKMB", "CKMBINDEX", "TROPONINI" in the last 168 hours. BNP (last 3 results) No results for input(s): "PROBNP" in the last 8760 hours. HbA1C: No results for input(s): "HGBA1C" in the last 72 hours. CBG: Recent Labs  Lab 04/12/23 1844  GLUCAP 87   Lipid Profile: No results for input(s): "CHOL", "HDL", "LDLCALC", "TRIG", "CHOLHDL", "LDLDIRECT" in the last 72 hours. Thyroid Function Tests: No results for input(s): "TSH", "T4TOTAL", "FREET4", "T3FREE", "THYROIDAB" in the last 72 hours. Anemia Panel: No results for input(s): "VITAMINB12", "FOLATE", "FERRITIN", "TIBC", "IRON", "RETICCTPCT" in the last 72 hours. Sepsis Labs: Recent Labs  Lab 04/13/23 0601  PROCALCITON 7.08    Recent Results (from the past 240 hour(s))  Blood culture (routine x 2)     Status: None (Preliminary result)   Collection Time: 04/12/23  7:08 PM   Specimen: BLOOD RIGHT HAND  Result Value Ref Range Status    Specimen Description BLOOD RIGHT HAND  Final   Special Requests   Final    BOTTLES DRAWN AEROBIC AND ANAEROBIC Blood Culture adequate volume   Culture  Setup Time   Final    GRAM NEGATIVE RODS ANAEROBIC BOTTLE ONLY Organism ID to follow Performed at [facility], [address], [place], [place] [address]    Culture GRAM NEGATIVE RODS  Final   Report Status PENDING  Incomplete  Blood culture (routine x 2)     Status: None (Preliminary result)   Collection Time: 04/12/23  7:38 PM   Specimen: BLOOD  Result Value Ref Range Status   Specimen Description BLOOD SITE NOT SPECIFIED  Final   Special Requests   Final    BOTTLES DRAWN AEROBIC AND ANAEROBIC Blood Culture  adequate volume   Culture  Setup Time   Final    GRAM NEGATIVE RODS ANAEROBIC BOTTLE ONLY Performed at [facility], [address], [place], [place] [address]    Culture GRAM NEGATIVE RODS  Final   Report Status PENDING  Incomplete         Radiology Studies: CT HIP LEFT WO CONTRAST  Result Date: 04/13/2023 CLINICAL DATA:  Septic arthritis suspected in the left hip. EXAM: CT OF THE LEFT HIP WITHOUT CONTRAST TECHNIQUE: Multidetector CT imaging of the left hip was performed according to the standard protocol. Multiplanar CT image reconstructions were also generated. RADIATION DOSE REDUCTION: This exam was performed according to the departmental dose-optimization program which includes automated exposure control, adjustment of the mA and/or kV according to patient size and/or use of iterative reconstruction technique. COMPARISON:  CT abdomen and pelvis and reconstructions recently 03/29/2023 FINDINGS: Bones/Joint/Cartilage Somewhat dense bones are again noted consistent with renal osteodystrophy. There is moderate superior joint space loss at the left hip with degenerative subcortical cystic changes of the superior acetabulum and small acetabular and femoral head osteophytes. No left hip joint effusion or joint surface  destruction is seen. Narrowing and trace spurring of the pubic symphysis is again noted. 3.3 cm hypodense collection again underlies the left ischial tuberosity was previously much better delineated and most likely represents an abscess. There is cortical indistinctness overlying this collection suspicious for osteomyelitis. Ligaments Suboptimally assessed by CT. Muscles and Tendons There is mild atrophy of the gluteal and upper thigh musculature, mild volume loss in the floor of pelvis muscles. No acute tendon abnormality is seen. No intramuscular fluid collections. Soft tissues There is heavy iliofemoral calcific arterial plaque. Minimal ascites in the posterior deep pelvis again is noted. Enlarged prostate with fiducial markers. Bladder is nondistended but appears thickened which could be due to XRT cystitis or bacterial cystitis. There are small bilateral inguinal fat hernias. IMPRESSION: 1. 3.3 cm hypodense collection again underlies the left ischial tuberosity, most likely an abscess and was seen previously. There is cortical indistinctness overlying this collection suspicious for osteomyelitis. 2. Moderate left hip osteoarthritis. No left hip joint effusion or joint surface destruction. 3. Renal osteodystrophy. 4. Calcific arteriosclerosis. 5. Minimal ascites in the posterior deep pelvis. 6. Chronic enlarged prostate with fiducial markers. 7. Bladder wall thickening which could be due to nondistention, XRT or bacterial cystitis. 8. Small bilateral inguinal fat hernias. Electronically Signed   By: Almira Bar M.D.   On: 04/13/2023 04:52   MR LUMBAR SPINE WO CONTRAST  Result Date: 04/12/2023 CLINICAL DATA:  Acute myelopathy EXAM: MRI THORACIC AND LUMBAR SPINE WITHOUT CONTRAST TECHNIQUE: Multiplanar and multiecho pulse sequences of the thoracic and lumbar spine were obtained without intravenous contrast. COMPARISON:  None Available. FINDINGS: MRI THORACIC SPINE FINDINGS Alignment:  Physiologic. Vertebrae:  The heterogeneous bone marrow signal without discrete lesion. There are Modic type 2 endplate signal changes at T8-9, T10-11 and T11-12. No acute fracture. Cord:  Normal signal and morphology. Paraspinal and other soft tissues: Small pleural effusions Disc levels: There is no spinal canal stenosis or neural impingement. No thoracic disc herniation. MRI LUMBAR SPINE FINDINGS Segmentation:  Standard. Alignment:  Grade 1 anterolisthesis at L3-4 Vertebrae: No fracture, evidence of discitis, or bone lesion. There are type 2 Modic signal changes at all levels. Schmorl's nodes are most prominent at L1 L2 and L3. There is mild degenerative height loss at L4. Conus medullaris and cauda equina: Conus extends to the L1 level. Conus  and cauda equina appear normal. Paraspinal and other soft tissues: Negative. Disc levels: L1-L2: Normal disc space and facet joints. No spinal canal stenosis. No neural foraminal stenosis. L2-L3: Small disc bulge and mild facet hypertrophy. No spinal canal stenosis. No neural foraminal stenosis. L3-L4: Large disc bulge and moderate facet hypertrophy. Moderate spinal canal stenosis. Severe bilateral neural foraminal stenosis. L4-L5: Intermediate sized disc bulge and mild facet hypertrophy. No spinal canal stenosis. Mild right neural foraminal stenosis. L5-S1: Intermediate sized disc bulge with large right extraforaminal osteophyte. No spinal canal stenosis. No neural foraminal stenosis. Visualized sacrum: Normal. IMPRESSION: 1. No acute abnormality of the thoracic or lumbar spine. 2. Moderate spinal canal stenosis and severe bilateral neural foraminal stenosis at L3-L4 due to combination of disc bulge and facet arthrosis. 3. Mild right L4-5 neural foraminal stenosis. 4. Small pleural effusions. 5. Widespread degenerative disc disease with multilevel type 2 Modic signal change. Electronically Signed   By: Deatra Robinson M.D.   On: 04/12/2023 23:51   MR THORACIC SPINE WO CONTRAST  Result Date:  04/12/2023 CLINICAL DATA:  Acute myelopathy EXAM: MRI THORACIC AND LUMBAR SPINE WITHOUT CONTRAST TECHNIQUE: Multiplanar and multiecho pulse sequences of the thoracic and lumbar spine were obtained without intravenous contrast. COMPARISON:  None Available. FINDINGS: MRI THORACIC SPINE FINDINGS Alignment:  Physiologic. Vertebrae: The heterogeneous bone marrow signal without discrete lesion. There are Modic type 2 endplate signal changes at T8-9, T10-11 and T11-12. No acute fracture. Cord:  Normal signal and morphology. Paraspinal and other soft tissues: Small pleural effusions Disc levels: There is no spinal canal stenosis or neural impingement. No thoracic disc herniation. MRI LUMBAR SPINE FINDINGS Segmentation:  Standard. Alignment:  Grade 1 anterolisthesis at L3-4 Vertebrae: No fracture, evidence of discitis, or bone lesion. There are type 2 Modic signal changes at all levels. Schmorl's nodes are most prominent at L1 L2 and L3. There is mild degenerative height loss at L4. Conus medullaris and cauda equina: Conus extends to the L1 level. Conus and cauda equina appear normal. Paraspinal and other soft tissues: Negative. Disc levels: L1-L2: Normal disc space and facet joints. No spinal canal stenosis. No neural foraminal stenosis. L2-L3: Small disc bulge and mild facet hypertrophy. No spinal canal stenosis. No neural foraminal stenosis. L3-L4: Large disc bulge and moderate facet hypertrophy. Moderate spinal canal stenosis. Severe bilateral neural foraminal stenosis. L4-L5: Intermediate sized disc bulge and mild facet hypertrophy. No spinal canal stenosis. Mild right neural foraminal stenosis. L5-S1: Intermediate sized disc bulge with large right extraforaminal osteophyte. No spinal canal stenosis. No neural foraminal stenosis. Visualized sacrum: Normal. IMPRESSION: 1. No acute abnormality of the thoracic or lumbar spine. 2. Moderate spinal canal stenosis and severe bilateral neural foraminal stenosis at L3-L4 due to  combination of disc bulge and facet arthrosis. 3. Mild right L4-5 neural foraminal stenosis. 4. Small pleural effusions. 5. Widespread degenerative disc disease with multilevel type 2 Modic signal change. Electronically Signed   By: Deatra Robinson M.D.   On: 04/12/2023 23:51   CT HEAD WO CONTRAST ( )  Result Date: 04/12/2023 CLINICAL DATA:  Leg pain and weakness, initial encounter EXAM: CT HEAD WITHOUT CONTRAST TECHNIQUE: Contiguous axial images were obtained from the base of the skull through the vertex without intravenous contrast. RADIATION DOSE REDUCTION: This exam was performed according to the departmental dose-optimization program which includes automated exposure control, adjustment of the mA and/or kV according to patient size and/or use of iterative reconstruction technique. COMPARISON:  11/09/2022 FINDINGS: Brain: No evidence of acute infarction, hemorrhage,  hydrocephalus, extra-axial collection or mass lesion/mass effect. Vascular: No hyperdense vessel or unexpected calcification. Skull: Normal. Negative for fracture or focal lesion. Sinuses/Orbits: No acute finding. Other: None. IMPRESSION: No acute abnormality noted. Electronically Signed   By: Alcide Clever M.D.   On: 04/12/2023 23:38   [name] CONTRAST  Result Date: 04/12/2023 CLINICAL DATA:  Acute neurologic deficit.  Left leg weakness. EXAM: MRI HEAD WITHOUT CONTRAST TECHNIQUE: Multiplanar, multiecho pulse sequences of the brain and surrounding structures were obtained without intravenous contrast. COMPARISON:  07/10/2011 FINDINGS: Brain: No acute infarct. No acute hemorrhage or extra-axial collection. Small amount of chronic blood products at the anterior right convexity. There is multifocal hyperintense T2-weighted signal within the periventricular and deep white matter. Mild volume loss. No hydrocephalus. The major midline structures are normal. Vascular: Normal flow voids. Skull and upper cervical spine: Normal marrow signal.  Sinuses/Orbits: Negative. Other: None. IMPRESSION: 1. No acute intracranial abnormality. 2. Findings of chronic microvascular ischemia. Electronically Signed   By: Deatra Robinson M.D.   On: 04/12/2023 23:23   DG Chest Portable 1 View  Result Date: 04/12/2023 CLINICAL DATA:  Pacemaker EXAM: PORTABLE CHEST 1 VIEW COMPARISON:  03/29/2023. FINDINGS: Unremarkable cardiac silhouette. Right-sided hemodialysis catheter tip at the RA/SVC junction. Alveolar opacity left base consistent with an early pneumonia or volume loss. Calcified aorta. No pneumothorax. No pleural effusion. IMPRESSION: Early/mild left base consolidation or volume loss. No pacemaker is seen. Electronically Signed   By: Layla Maw M.D.   On: 04/12/2023 19:40           LOS: 0 days   Time spent= 35 mins    Miguel Rota, MD [facility]  If 7PM-7AM, please contact night-coverage  04/13/2023, 9:03 AM

## 2023-04-13 NOTE — Progress Notes (Signed)
Pharmacy Antibiotic Note  Justin [name] is a 77 y.o. male admitted on 04/12/2023 with acute left hip pain and concerns for osteomyelitis. PMH includes ESRD (on HD), afib, history of L ischial abscess s/p drainage and antibiotics with cultures not showing an organism. CT findings showed abscess and possible osteomyelitis.  Pharmacy has been consulted for cefepime and vancomycin dosing.  WBC 16.9, afebrile, blood cultures collected  Plan: Cefepime 1g IV every 24 hours  Vancomycin 1750mg  IV x1 Vancomycin per unstable renal function until HD schedule set Flagyl 500mg  IV every 12 hours Follow up IR for drainaige Follow up signs of clinical improvement, LOT, de-escalation of antibiotics.  Height: 5\' 10"  (177.8 cm) Weight: 81.2 kg (179 lb) IBW/kg (Calculated) : 73  Temp (24hrs), Avg:98.1 F (36.7 C), Min:98 F (36.7 C), Max:98.3 F (36.8 C)  Recent Labs  Lab 04/07/23 1024 04/12/23 1837  WBC 8.3 16.9*  CREATININE 7.20* 8.57*    Estimated Creatinine Clearance: 7.2 mL/min (A) (by C-G formula based on SCr of 8.57 mg/dL (H)).    No Known Allergies  Antimicrobials this admission: Cefepime 9/30 >>  Vancomycin 9/30 >>  Flagyl 10/1 >>  Microbiology results: 9/30 BCx:   Thank you for allowing pharmacy to be a part of this patient's care.  Arabella Merles, PharmD. Clinical Pharmacist 04/13/2023 5:57 AM

## 2023-04-13 NOTE — Progress Notes (Signed)
PHARMACY - PHYSICIAN COMMUNICATION CRITICAL VALUE ALERT - BLOOD CULTURE IDENTIFICATION (BCID)  Justin [name] is an 77 y.o. male who presented to [facility] on 04/12/2023 with a chief complaint of hip pain  Assessment:  Klebsiella pneumoniae bacteremia   Name of physician (or Provider) Contacted: Dr. Stephania Fragmin (Primary)  Current antibiotics: Vancomycin, cefepime, metronidazole  Changes to prescribed antibiotics recommended:  Provider notified of blood culture result. Current antimicrobials with activity, but recommend de-escalation to Ceftriaxone +/- metronidazole given recent abscess.   Results for orders placed or performed during the hospital encounter of 04/12/23  Blood Culture ID Panel (Reflexed) (Collected: 04/12/2023  7:08 PM)  Result Value Ref Range   Enterococcus faecalis NOT DETECTED NOT DETECTED   Enterococcus Faecium NOT DETECTED NOT DETECTED   Listeria monocytogenes NOT DETECTED NOT DETECTED   Staphylococcus species NOT DETECTED NOT DETECTED   Staphylococcus aureus (BCID) NOT DETECTED NOT DETECTED   Staphylococcus epidermidis NOT DETECTED NOT DETECTED   Staphylococcus lugdunensis NOT DETECTED NOT DETECTED   Streptococcus species NOT DETECTED NOT DETECTED   Streptococcus agalactiae NOT DETECTED NOT DETECTED   Streptococcus pneumoniae NOT DETECTED NOT DETECTED   Streptococcus pyogenes NOT DETECTED NOT DETECTED   A.calcoaceticus-baumannii NOT DETECTED NOT DETECTED   Bacteroides fragilis NOT DETECTED NOT DETECTED   Enterobacterales DETECTED (A) NOT DETECTED   Enterobacter cloacae complex NOT DETECTED NOT DETECTED   Escherichia coli NOT DETECTED NOT DETECTED   Klebsiella aerogenes NOT DETECTED NOT DETECTED   Klebsiella oxytoca NOT DETECTED NOT DETECTED   Klebsiella pneumoniae DETECTED (A) NOT DETECTED   Proteus species NOT DETECTED NOT DETECTED   Salmonella species NOT DETECTED NOT DETECTED   Serratia marcescens NOT DETECTED NOT DETECTED   Haemophilus influenzae NOT  DETECTED NOT DETECTED   Neisseria meningitidis NOT DETECTED NOT DETECTED   Pseudomonas aeruginosa NOT DETECTED NOT DETECTED   Stenotrophomonas maltophilia NOT DETECTED NOT DETECTED   Candida albicans NOT DETECTED NOT DETECTED   Candida auris NOT DETECTED NOT DETECTED   Candida glabrata NOT DETECTED NOT DETECTED   Candida krusei NOT DETECTED NOT DETECTED   Candida parapsilosis NOT DETECTED NOT DETECTED   Candida tropicalis NOT DETECTED NOT DETECTED   Cryptococcus neoformans/gattii NOT DETECTED NOT DETECTED   CTX-M ESBL NOT DETECTED NOT DETECTED   Carbapenem resistance IMP NOT DETECTED NOT DETECTED   Carbapenem resistance KPC NOT DETECTED NOT DETECTED   Carbapenem resistance NDM NOT DETECTED NOT DETECTED   Carbapenem resist OXA 48 LIKE NOT DETECTED NOT DETECTED   Carbapenem resistance VIM NOT DETECTED NOT DETECTED    Lora Paula, PharmD PGY-2 Infectious Diseases Pharmacy Resident 04/13/2023 10:59 AM

## 2023-04-13 NOTE — Assessment & Plan Note (Signed)
WBC 16.9k, RVR with his A.Fib. EDP ? PNA but no SOB, no productive cough. Im more concerned about repeat of infection in L hip area BCx pending Procalcitonin pending CT L hip pending. Got single dose of cefepime + vanc in ED, will defer re-ordering these for the moment while the above work up is still in progress

## 2023-04-13 NOTE — ED Notes (Signed)
Brief report given to Cat-RN.  SBAR placed, purple man green.

## 2023-04-14 DIAGNOSIS — M25552 Pain in left hip: Secondary | ICD-10-CM | POA: Diagnosis not present

## 2023-04-14 DIAGNOSIS — R6510 Systemic inflammatory response syndrome (SIRS) of non-infectious origin without acute organ dysfunction: Secondary | ICD-10-CM | POA: Diagnosis not present

## 2023-04-14 DIAGNOSIS — I480 Paroxysmal atrial fibrillation: Secondary | ICD-10-CM | POA: Diagnosis not present

## 2023-04-14 DIAGNOSIS — R1310 Dysphagia, unspecified: Secondary | ICD-10-CM

## 2023-04-14 DIAGNOSIS — N186 End stage renal disease: Secondary | ICD-10-CM | POA: Diagnosis not present

## 2023-04-14 LAB — RENAL FUNCTION PANEL
Albumin: 2.1 g/dL — ABNORMAL LOW (ref 3.5–5.0)
Anion gap: 18 — ABNORMAL HIGH (ref 5–15)
BUN: 63 mg/dL — ABNORMAL HIGH (ref 8–23)
CO2: 22 mmol/L (ref 22–32)
Calcium: 8.5 mg/dL — ABNORMAL LOW (ref 8.9–10.3)
Chloride: 100 mmol/L (ref 98–111)
Creatinine, Ser: 10.31 mg/dL — ABNORMAL HIGH (ref 0.61–1.24)
GFR, Estimated: 5 mL/min — ABNORMAL LOW (ref >60)
Glucose, Bld: 107 mg/dL — ABNORMAL HIGH (ref 70–99)
Phosphorus: 5.4 mg/dL — ABNORMAL HIGH (ref 2.5–4.6)
Potassium: 3.6 mmol/L (ref 3.5–5.1)
Sodium: 140 mmol/L (ref 135–145)

## 2023-04-14 LAB — CBC
HCT: 28 % — ABNORMAL LOW (ref 39.0–52.0)
Hemoglobin: 9 g/dL — ABNORMAL LOW (ref 13.0–17.0)
MCH: 31 pg (ref 26.0–34.0)
MCHC: 32.1 g/dL (ref 30.0–36.0)
MCV: 96.6 fL (ref 80.0–100.0)
Platelets: 193 10*3/uL (ref 150–400)
RBC: 2.9 MIL/uL — ABNORMAL LOW (ref 4.22–5.81)
RDW: 16 % — ABNORMAL HIGH (ref 11.5–15.5)
WBC: 7.2 10*3/uL (ref 4.0–10.5)
nRBC: 0 % (ref 0.0–0.2)

## 2023-04-14 MED ORDER — CINACALCET HCL 30 MG PO TABS
30.0000 mg | ORAL_TABLET | Freq: Every day | ORAL | Status: DC
  Administered 2023-04-14 – 2023-04-20 (×7): 30 mg via ORAL
  Filled 2023-04-14 (×7): qty 1

## 2023-04-14 MED ORDER — LIDOCAINE HCL (PF) 1 % IJ SOLN: 5.0000 mL | INTRAMUSCULAR | Status: DC | PRN

## 2023-04-14 MED ORDER — SEVELAMER CARBONATE 800 MG PO TABS
800.0000 mg | ORAL_TABLET | Freq: Three times a day (TID) | ORAL | Status: DC
  Administered 2023-04-14 – 2023-04-21 (×19): 800 mg via ORAL
  Filled 2023-04-14 (×19): qty 1

## 2023-04-14 MED ORDER — HEPARIN SODIUM (PORCINE) 1000 UNIT/ML DIALYSIS
1000.0000 [IU] | INTRAMUSCULAR | Status: DC | PRN
  Administered 2023-04-14: 3800 [IU]
  Filled 2023-04-14: qty 1

## 2023-04-14 MED ORDER — METRONIDAZOLE 500 MG PO TABS
500.0000 mg | ORAL_TABLET | Freq: Two times a day (BID) | ORAL | Status: DC
  Administered 2023-04-14 – 2023-04-15 (×3): 500 mg via ORAL
  Filled 2023-04-14 (×3): qty 1

## 2023-04-14 MED ORDER — ANTICOAGULANT SODIUM CITRATE 4% (200MG/5ML) IV SOLN: 5.0000 mL | Status: DC | PRN

## 2023-04-14 MED ORDER — OXYCODONE-ACETAMINOPHEN 5-325 MG PO TABS
1.0000 | ORAL_TABLET | Freq: Four times a day (QID) | ORAL | Status: DC | PRN
  Administered 2023-04-14: 2 via ORAL
  Administered 2023-04-15 – 2023-04-19 (×6): 1 via ORAL
  Administered 2023-04-20: 2 via ORAL
  Filled 2023-04-14: qty 1
  Filled 2023-04-14: qty 2
  Filled 2023-04-14 (×2): qty 1
  Filled 2023-04-14: qty 2
  Filled 2023-04-14: qty 1
  Filled 2023-04-14: qty 2
  Filled 2023-04-14: qty 1

## 2023-04-14 MED ORDER — PENTAFLUOROPROP-TETRAFLUOROETH EX AERO: 1.0000 | INHALATION_SPRAY | CUTANEOUS | Status: DC | PRN

## 2023-04-14 MED ORDER — NEPRO/CARBSTEADY PO LIQD: 237.0000 mL | ORAL | Status: DC | PRN

## 2023-04-14 MED ORDER — LIDOCAINE-PRILOCAINE 2.5-2.5 % EX CREA: 1.0000 | TOPICAL_CREAM | CUTANEOUS | Status: DC | PRN

## 2023-04-14 MED ORDER — ALTEPLASE 2 MG IJ SOLR: 2.0000 mg | Freq: Once | INTRAMUSCULAR | Status: DC | PRN

## 2023-04-14 NOTE — Progress Notes (Signed)
OT Cancellation Note  Patient Details Name: Justin [name] MRN: [id number] DOB: Apr 04, 1946   Cancelled Treatment:    Reason Eval/Treat Not Completed: Patient at procedure or test/ unavailable (HD) OT will continue to follow for evaluation acutely as schedule allows.  Evern Bio Aarsh Fristoe 04/14/2023, 1:27 PM  Nyoka Cowden OTR/L Acute Rehabilitation Services Office: [phone]

## 2023-04-14 NOTE — Progress Notes (Signed)
PROGRESS NOTE    Justin [name]  MWU:[id number] DOB: 10-May-1946 DOA: 04/12/2023 PCP: Pcp, No   Brief Narrative:   77 year old with history of ESRD on HD, GIB, rectal abscess, cholecystitis with percutaneous drain, diabetes mellitus type 2 diet-controlled, chronic hypotension on midodrine, ambulatory dysfunction/wheelchair-bound for the last 3-4 months was admitted to the hospital last month for ESRD and septic shock.  Patient was noted to have 3.5 cm ischial abscess drained by IR, cultures were negative completed antibiotic course in the hospital and was discharged on September 26.  Patient now presented back to the hospital again with persistent left groin pain.  IR reviewed images, no obvious area requiring drainage.  Currently on empiric antibiotics. ID consulted.    Assessment & Plan:  Principal Problem:   Acute hip pain, left Active Problems:   SIRS (systemic inflammatory response syndrome) (HCC)   ESRD on hemodialysis (HCC)   Paroxysmal atrial fibrillation with RVR (HCC)   History of GI bleed   Ambulatory dysfunction   History of cholecystitis   Dysphagia    Sepsis secondary to acute left hip osteomyelitis causing pain Klebsiella bacteremia. Leukocytosis. At baseline he has been nonambulatory for the past several months.  CT scan of the left hip again shows small fluid collection but after reviewing by IR it is not sufficient for any sort of aspiration therefore recommending IV antibiotics.  ID on board.  Currently on cefepime and oral metronidazole.  Vancomycin was discontinued.  Check ESR CRP.  Follow culture and sensitivity.    Paroxysmal atrial fibrillation with RVR (HCC) Continue amiodarone, digoxin, Eliquis.  Rate controlled at this time.   ESRD on hemodialysis  Continue midodrine for chronic hypotension.  Nephrology on board for hemodialysis needs.  Blood pressure stable.   History of cholecystitis Status post percutaneous cholecystostomy tube in place changed to  regularly at [facility]. Saw gen surg on 9/11 to consider cholecystectomy, they felt risk would be prohibitive though.   History of GI bleed Anemia of chronic disease No current evidence.  On Eliquis.  Deconditioning, debility.  Will check PT OT.  Has a wheelchair at baseline.  Pressure injury.  Present on admission.  Continue wound care.  Pressure Injury 12/14/22 Heel Left;Posterior Unstageable - Full thickness tissue loss in which the base of the injury is covered by slough (yellow, tan, gray, green or brown) and/or eschar (tan, brown or black) in the wound bed. (Active)  12/14/22 0700  Location: Heel  Location Orientation: Left;Posterior  Staging: Unstageable - Full thickness tissue loss in which the base of the injury is covered by slough (yellow, tan, gray, green or brown) and/or eschar (tan, brown or black) in the wound bed.  Wound Description (Comments):   Present on Admission: Yes     Pressure Injury 12/14/22 Heel Right Unstageable - Full thickness tissue loss in which the base of the injury is covered by slough (yellow, tan, gray, green or brown) and/or eschar (tan, brown or black) in the wound bed. (Active)  12/14/22 0700  Location: Heel  Location Orientation: Right  Staging: Unstageable - Full thickness tissue loss in which the base of the injury is covered by slough (yellow, tan, gray, green or brown) and/or eschar (tan, brown or black) in the wound bed.  Wound Description (Comments):   Present on Admission: Yes    DVT prophylaxis: Eliquis  Consults performed.   Interventional radiology Infectious disease Nephrology.  Procedures. Hemodialysis  Code Status: DNR  Family Communication:   None at bedside.  Subjective:   Physical examination: General:  Average built, not in obvious distress HENT:   No scleral pallor or icterus noted. Oral mucosa is moist.  Chest:  Clear breath sounds.  Diminished breath sounds bilaterally. No crackles or wheezes.  Right subclavian  hemodialysis catheter in place CVS: S1 &S2 heard. No murmur.  Regular rate and rhythm. Abdomen: Soft, nontender, nondistended.  Bowel sounds are heard.  Cholecystostomy drain in place Extremities: No cyanosis, clubbing or edema.  Unable to move the left lower extremity with tenderness on palpation. Psych: Alert, awake and oriented, normal mood CNS:  No cranial nerve deficits.  Power equal in all extremities.   Skin: Warm and dry.  Pressure injury of the left heel right heel   Objective: Vitals:   04/13/23 1330 04/13/23 1613 04/13/23 2028 04/14/23 0127  BP: 121/68 118/77 114/64 103/72  Pulse: 100 (!) 103 68 74  Resp: (!) 8 16 17 17   Temp:  98.3 F (36.8 C) 98.1 F (36.7 C) 98.4 F (36.9 C)  TempSrc:  Oral Oral Oral  SpO2: 97% 100% 100% 98%  Weight:      Height:        Intake/Output Summary (Last 24 hours) at 04/14/2023 0715 Last data filed at 04/14/2023 0559 Gross per 24 hour  Intake 94.21 ml  Output 300 ml  Net -205.79 ml   Filed Weights   04/12/23 2002  Weight: 81.2 kg    Scheduled Meds:  amiodarone  200 mg Oral Daily   apixaban  2.5 mg Oral BID   atorvastatin  40 mg Oral QHS   calcitRIOL  0.25 mcg Oral Daily   Chlorhexidine Gluconate Cloth  6 each Topical Daily   digoxin  0.0625 mg Oral QODAY   midodrine  20 mg Oral TID WC   pregabalin  75 mg Oral BID   Continuous Infusions:  anticoagulant sodium citrate     cefTRIAXone (ROCEPHIN)  IV 2 g (04/13/23 2149)   metronidazole 500 mg (04/14/23 0648)     Data Reviewed:   CBC: Recent Labs  Lab 04/07/23 1024 04/12/23 1837 04/13/23 0601  WBC 8.3 16.9* 14.9*  HGB 9.4* 10.2* 9.3*  HCT 30.1* 31.9* 30.2*  MCV 97.7 96.7 96.8  PLT 170 204 199   Basic Metabolic Panel: Recent Labs  Lab 04/07/23 1024 04/12/23 1837 04/13/23 0601  NA 137 139 139  K 3.4* 4.2 4.0  CL 100 100 103  CO2 28 23 22   GLUCOSE 127* 89 72  BUN 34* 45* 51*  CREATININE 7.20* 8.57* 8.94*  CALCIUM 8.0* 9.1 8.8*  PHOS 3.3  --   --     GFR: Estimated Creatinine Clearance: 6.9 mL/min (A) (by C-G formula based on SCr of 8.94 mg/dL (H)). Liver Function Tests: Recent Labs  Lab 04/07/23 1024  ALBUMIN 2.0*   No results for input(s): "LIPASE", "AMYLASE" in the last 168 hours. No results for input(s): "AMMONIA" in the last 168 hours. Coagulation Profile: No results for input(s): "INR", "PROTIME" in the last 168 hours. Cardiac Enzymes: No results for input(s): "CKTOTAL", "CKMB", "CKMBINDEX", "TROPONINI" in the last 168 hours. BNP (last 3 results) No results for input(s): "PROBNP" in the last 8760 hours. HbA1C: No results for input(s): "HGBA1C" in the last 72 hours. CBG: Recent Labs  Lab 04/12/23 1844  GLUCAP 87   Lipid Profile: No results for input(s): "CHOL", "HDL", "LDLCALC", "TRIG", "CHOLHDL", "LDLDIRECT" in the last 72 hours. Thyroid Function Tests: No results for input(s): "TSH", "T4TOTAL", "FREET4", "T3FREE", "THYROIDAB" in  the last 72 hours. Anemia Panel: No results for input(s): "VITAMINB12", "FOLATE", "FERRITIN", "TIBC", "IRON", "RETICCTPCT" in the last 72 hours. Sepsis Labs: Recent Labs  Lab 04/13/23 0601  PROCALCITON 7.08    Recent Results (from the past 240 hour(s))  Blood culture (routine x 2)     Status: None (Preliminary result)   Collection Time: 04/12/23  7:08 PM   Specimen: BLOOD RIGHT HAND  Result Value Ref Range Status   Specimen Description BLOOD RIGHT HAND  Final   Special Requests   Final    BOTTLES DRAWN AEROBIC AND ANAEROBIC Blood Culture adequate volume   Culture  Setup Time   Final    GRAM NEGATIVE RODS IN BOTH AEROBIC AND ANAEROBIC BOTTLES Organism ID to follow CRITICAL RESULT CALLED TO, READ BACK BY AND VERIFIED WITH: BTilda Burrow PHARMD, AT 1041 04/13/23 Renato Shin Performed at [facility], [address] [place], [place] [address]    Culture GRAM NEGATIVE RODS  Final   Report Status PENDING  Incomplete  Blood Culture ID Panel (Reflexed)     Status: Abnormal    Collection Time: 04/12/23  7:08 PM  Result Value Ref Range Status   Enterococcus faecalis NOT DETECTED NOT DETECTED Final   Enterococcus Faecium NOT DETECTED NOT DETECTED Final   Listeria monocytogenes NOT DETECTED NOT DETECTED Final   Staphylococcus species NOT DETECTED NOT DETECTED Final   Staphylococcus aureus (BCID) NOT DETECTED NOT DETECTED Final   Staphylococcus epidermidis NOT DETECTED NOT DETECTED Final   Staphylococcus lugdunensis NOT DETECTED NOT DETECTED Final   Streptococcus species NOT DETECTED NOT DETECTED Final   Streptococcus agalactiae NOT DETECTED NOT DETECTED Final   Streptococcus pneumoniae NOT DETECTED NOT DETECTED Final   Streptococcus pyogenes NOT DETECTED NOT DETECTED Final   A.calcoaceticus-baumannii NOT DETECTED NOT DETECTED Final   Bacteroides fragilis NOT DETECTED NOT DETECTED Final   Enterobacterales DETECTED (A) NOT DETECTED Final    Comment: Enterobacterales represent a large order of gram negative bacteria, not a single organism. CRITICAL RESULT CALLED TO, READ BACK BY AND VERIFIED WITH: B. AGEE PHARMD, AT 1041 04/13/23 D. VANHOOK    Enterobacter cloacae complex NOT DETECTED NOT DETECTED Final   Escherichia coli NOT DETECTED NOT DETECTED Final   Klebsiella aerogenes NOT DETECTED NOT DETECTED Final   Klebsiella oxytoca NOT DETECTED NOT DETECTED Final   Klebsiella pneumoniae DETECTED (A) NOT DETECTED Final    Comment: CRITICAL RESULT CALLED TO, READ BACK BY AND VERIFIED WITH: B. AGEE PHARMD, AT 1041 04/13/23 D. VANHOOK    Proteus species NOT DETECTED NOT DETECTED Final   Salmonella species NOT DETECTED NOT DETECTED Final   Serratia marcescens NOT DETECTED NOT DETECTED Final   Haemophilus influenzae NOT DETECTED NOT DETECTED Final   Neisseria meningitidis NOT DETECTED NOT DETECTED Final   Pseudomonas aeruginosa NOT DETECTED NOT DETECTED Final   Stenotrophomonas maltophilia NOT DETECTED NOT DETECTED Final   Candida albicans NOT DETECTED NOT DETECTED Final    Candida auris NOT DETECTED NOT DETECTED Final   Candida glabrata NOT DETECTED NOT DETECTED Final   Candida krusei NOT DETECTED NOT DETECTED Final   Candida parapsilosis NOT DETECTED NOT DETECTED Final   Candida tropicalis NOT DETECTED NOT DETECTED Final   Cryptococcus neoformans/gattii NOT DETECTED NOT DETECTED Final   CTX-M ESBL NOT DETECTED NOT DETECTED Final   Carbapenem resistance IMP NOT DETECTED NOT DETECTED Final   Carbapenem resistance KPC NOT DETECTED NOT DETECTED Final   Carbapenem resistance NDM NOT DETECTED NOT DETECTED Final  Carbapenem resist OXA 48 LIKE NOT DETECTED NOT DETECTED Final   Carbapenem resistance VIM NOT DETECTED NOT DETECTED Final    Comment: Performed at [facility], [address], [place], [place] [address]  Blood culture (routine x 2)     Status: None (Preliminary result)   Collection Time: 04/12/23  7:38 PM   Specimen: BLOOD  Result Value Ref Range Status   Specimen Description BLOOD SITE NOT SPECIFIED  Final   Special Requests   Final    BOTTLES DRAWN AEROBIC AND ANAEROBIC Blood Culture adequate volume   Culture  Setup Time   Final    GRAM NEGATIVE RODS IN BOTH AEROBIC AND ANAEROBIC BOTTLES CRITICAL RESULT CALLED TO, READ BACK BY AND VERIFIED WITH: BTilda Burrow PHARMD, AT 1041 04/13/23 Renato Shin Performed at [facility], [address] [place], [place] [address]    Culture GRAM NEGATIVE RODS  Final   Report Status PENDING  Incomplete       Radiology Studies: CT HIP LEFT WO CONTRAST  Result Date: 04/13/2023 CLINICAL DATA:  Septic arthritis suspected in the left hip. EXAM: CT OF THE LEFT HIP WITHOUT CONTRAST TECHNIQUE: Multidetector CT imaging of the left hip was performed according to the standard protocol. Multiplanar CT image reconstructions were also generated. RADIATION DOSE REDUCTION: This exam was performed according to the departmental dose-optimization program which includes automated exposure control, adjustment of the mA  and/or kV according to patient size and/or use of iterative reconstruction technique. COMPARISON:  CT abdomen and pelvis and reconstructions recently 03/29/2023 FINDINGS: Bones/Joint/Cartilage Somewhat dense bones are again noted consistent with renal osteodystrophy. There is moderate superior joint space loss at the left hip with degenerative subcortical cystic changes of the superior acetabulum and small acetabular and femoral head osteophytes. No left hip joint effusion or joint surface destruction is seen. Narrowing and trace spurring of the pubic symphysis is again noted. 3.3 cm hypodense collection again underlies the left ischial tuberosity was previously much better delineated and most likely represents an abscess. There is cortical indistinctness overlying this collection suspicious for osteomyelitis. Ligaments Suboptimally assessed by CT. Muscles and Tendons There is mild atrophy of the gluteal and upper thigh musculature, mild volume loss in the floor of pelvis muscles. No acute tendon abnormality is seen. No intramuscular fluid collections. Soft tissues There is heavy iliofemoral calcific arterial plaque. Minimal ascites in the posterior deep pelvis again is noted. Enlarged prostate with fiducial markers. Bladder is nondistended but appears thickened which could be due to XRT cystitis or bacterial cystitis. There are small bilateral inguinal fat hernias. IMPRESSION: 1. 3.3 cm hypodense collection again underlies the left ischial tuberosity, most likely an abscess and was seen previously. There is cortical indistinctness overlying this collection suspicious for osteomyelitis. 2. Moderate left hip osteoarthritis. No left hip joint effusion or joint surface destruction. 3. Renal osteodystrophy. 4. Calcific arteriosclerosis. 5. Minimal ascites in the posterior deep pelvis. 6. Chronic enlarged prostate with fiducial markers. 7. Bladder wall thickening which could be due to nondistention, XRT or bacterial  cystitis. 8. Small bilateral inguinal fat hernias. Electronically Signed   By: Almira Bar M.D.   On: 04/13/2023 04:52   MR LUMBAR SPINE WO CONTRAST  Result Date: 04/12/2023 CLINICAL DATA:  Acute myelopathy EXAM: MRI THORACIC AND LUMBAR SPINE WITHOUT CONTRAST TECHNIQUE: Multiplanar and multiecho pulse sequences of the thoracic and lumbar spine were obtained without intravenous contrast. COMPARISON:  None Available. FINDINGS: MRI THORACIC SPINE FINDINGS Alignment:  Physiologic. Vertebrae: The  heterogeneous bone marrow signal without discrete lesion. There are Modic type 2 endplate signal changes at T8-9, T10-11 and T11-12. No acute fracture. Cord:  Normal signal and morphology. Paraspinal and other soft tissues: Small pleural effusions Disc levels: There is no spinal canal stenosis or neural impingement. No thoracic disc herniation. MRI LUMBAR SPINE FINDINGS Segmentation:  Standard. Alignment:  Grade 1 anterolisthesis at L3-4 Vertebrae: No fracture, evidence of discitis, or bone lesion. There are type 2 Modic signal changes at all levels. Schmorl's nodes are most prominent at L1 L2 and L3. There is mild degenerative height loss at L4. Conus medullaris and cauda equina: Conus extends to the L1 level. Conus and cauda equina appear normal. Paraspinal and other soft tissues: Negative. Disc levels: L1-L2: Normal disc space and facet joints. No spinal canal stenosis. No neural foraminal stenosis. L2-L3: Small disc bulge and mild facet hypertrophy. No spinal canal stenosis. No neural foraminal stenosis. L3-L4: Large disc bulge and moderate facet hypertrophy. Moderate spinal canal stenosis. Severe bilateral neural foraminal stenosis. L4-L5: Intermediate sized disc bulge and mild facet hypertrophy. No spinal canal stenosis. Mild right neural foraminal stenosis. L5-S1: Intermediate sized disc bulge with large right extraforaminal osteophyte. No spinal canal stenosis. No neural foraminal stenosis. Visualized sacrum:  Normal. IMPRESSION: 1. No acute abnormality of the thoracic or lumbar spine. 2. Moderate spinal canal stenosis and severe bilateral neural foraminal stenosis at L3-L4 due to combination of disc bulge and facet arthrosis. 3. Mild right L4-5 neural foraminal stenosis. 4. Small pleural effusions. 5. Widespread degenerative disc disease with multilevel type 2 Modic signal change. Electronically Signed   By: Deatra Robinson M.D.   On: 04/12/2023 23:51   MR THORACIC SPINE WO CONTRAST  Result Date: 04/12/2023 CLINICAL DATA:  Acute myelopathy EXAM: MRI THORACIC AND LUMBAR SPINE WITHOUT CONTRAST TECHNIQUE: Multiplanar and multiecho pulse sequences of the thoracic and lumbar spine were obtained without intravenous contrast. COMPARISON:  None Available. FINDINGS: MRI THORACIC SPINE FINDINGS Alignment:  Physiologic. Vertebrae: The heterogeneous bone marrow signal without discrete lesion. There are Modic type 2 endplate signal changes at T8-9, T10-11 and T11-12. No acute fracture. Cord:  Normal signal and morphology. Paraspinal and other soft tissues: Small pleural effusions Disc levels: There is no spinal canal stenosis or neural impingement. No thoracic disc herniation. MRI LUMBAR SPINE FINDINGS Segmentation:  Standard. Alignment:  Grade 1 anterolisthesis at L3-4 Vertebrae: No fracture, evidence of discitis, or bone lesion. There are type 2 Modic signal changes at all levels. Schmorl's nodes are most prominent at L1 L2 and L3. There is mild degenerative height loss at L4. Conus medullaris and cauda equina: Conus extends to the L1 level. Conus and cauda equina appear normal. Paraspinal and other soft tissues: Negative. Disc levels: L1-L2: Normal disc space and facet joints. No spinal canal stenosis. No neural foraminal stenosis. L2-L3: Small disc bulge and mild facet hypertrophy. No spinal canal stenosis. No neural foraminal stenosis. L3-L4: Large disc bulge and moderate facet hypertrophy. Moderate spinal canal stenosis.  Severe bilateral neural foraminal stenosis. L4-L5: Intermediate sized disc bulge and mild facet hypertrophy. No spinal canal stenosis. Mild right neural foraminal stenosis. L5-S1: Intermediate sized disc bulge with large right extraforaminal osteophyte. No spinal canal stenosis. No neural foraminal stenosis. Visualized sacrum: Normal. IMPRESSION: 1. No acute abnormality of the thoracic or lumbar spine. 2. Moderate spinal canal stenosis and severe bilateral neural foraminal stenosis at L3-L4 due to combination of disc bulge and facet arthrosis. 3. Mild right L4-5 neural foraminal stenosis. 4. Small pleural  effusions. 5. Widespread degenerative disc disease with multilevel type 2 Modic signal change. Electronically Signed   By: Deatra Robinson M.D.   On: 04/12/2023 23:51   CT HEAD WO CONTRAST ( )  Result Date: 04/12/2023 CLINICAL DATA:  Leg pain and weakness, initial encounter EXAM: CT HEAD WITHOUT CONTRAST TECHNIQUE: Contiguous axial images were obtained from the base of the skull through the vertex without intravenous contrast. RADIATION DOSE REDUCTION: This exam was performed according to the departmental dose-optimization program which includes automated exposure control, adjustment of the mA and/or kV according to patient size and/or use of iterative reconstruction technique. COMPARISON:  11/09/2022 FINDINGS: Brain: No evidence of acute infarction, hemorrhage, hydrocephalus, extra-axial collection or mass lesion/mass effect. Vascular: No hyperdense vessel or unexpected calcification. Skull: Normal. Negative for fracture or focal lesion. Sinuses/Orbits: No acute finding. Other: None. IMPRESSION: No acute abnormality noted. Electronically Signed   By: Alcide Clever M.D.   On: 04/12/2023 23:38   [name] CONTRAST  Result Date: 04/12/2023 CLINICAL DATA:  Acute neurologic deficit.  Left leg weakness. EXAM: MRI HEAD WITHOUT CONTRAST TECHNIQUE: Multiplanar, multiecho pulse sequences of the brain and  surrounding structures were obtained without intravenous contrast. COMPARISON:  07/10/2011 FINDINGS: Brain: No acute infarct. No acute hemorrhage or extra-axial collection. Small amount of chronic blood products at the anterior right convexity. There is multifocal hyperintense T2-weighted signal within the periventricular and deep white matter. Mild volume loss. No hydrocephalus. The major midline structures are normal. Vascular: Normal flow voids. Skull and upper cervical spine: Normal marrow signal. Sinuses/Orbits: Negative. Other: None. IMPRESSION: 1. No acute intracranial abnormality. 2. Findings of chronic microvascular ischemia. Electronically Signed   By: Deatra Robinson M.D.   On: 04/12/2023 23:23   DG Chest Portable 1 View  Result Date: 04/12/2023 CLINICAL DATA:  Pacemaker EXAM: PORTABLE CHEST 1 VIEW COMPARISON:  03/29/2023. FINDINGS: Unremarkable cardiac silhouette. Right-sided hemodialysis catheter tip at the RA/SVC junction. Alveolar opacity left base consistent with an early pneumonia or volume loss. Calcified aorta. No pneumothorax. No pleural effusion. IMPRESSION: Early/mild left base consolidation or volume loss. No pacemaker is seen. Electronically Signed   By: Layla Maw M.D.   On: 04/12/2023 19:40       LOS: 1 day   Joycelyn Das, MD [facility] If 7PM-7AM, please contact night-coverage  04/14/2023, 7:15 AM

## 2023-04-14 NOTE — Evaluation (Signed)
Physical Therapy Evaluation Patient Details Name: Justin [name] MRN: [id number] DOB: 1946-02-28 Today's Date: 04/14/2023  History of Present Illness  77 y.o. male presents to [facility] hospital on 04/12/2023 with persistent L groin pain. Pt recently admitted in September for management of sepsis and ischial abscess. PMH includes ESRD on HD, GIB, rectal abscess, cholecystitis with drain, DMII, chronic hypotension, ambulatory dysfunction.  Clinical Impression  Pt presents to PT with deficits in strength, power, gait, balance, functional mobility, endurance. Pt is largely limited by pain in L hip/groin. Pt requires assistance for LLE mobility due to pain and weakness. Pt is unable to tolerate weight shift onto L pelvis in sitting to obtain and upright posture, instead leaning to R side. Pt will benefit from continued PT services in an effort to improve activity tolerance and to reduce caregiver burden when mobilizing. Pt's goal is to return to standing, which may be possible if pain is controlled and the pt is able to tolerate further functional mobility training. PT recommends short term inpatient PT services at the time of discharge.        If plan is discharge home, recommend the following: Two people to help with walking and/or transfers;Two people to help with bathing/dressing/bathroom;Assistance with cooking/housework;Assist for transportation;Help with stairs or ramp for entrance   Can travel by private vehicle   No    Equipment Recommendations Hospital bed  Recommendations for Other Services       Functional Status Assessment Patient has had a recent decline in their functional status and demonstrates the ability to make significant improvements in function in a reasonable and predictable amount of time.     Precautions / Restrictions Precautions Precautions: Fall Precaution Comments: RLQ cholecystitis drain Restrictions Weight Bearing Restrictions: No      Mobility  Bed  Mobility Overal bed mobility: Needs Assistance Bed Mobility: Rolling, Sidelying to Sit, Sit to Supine Rolling: Min assist Sidelying to sit: Mod assist, Used rails   Sit to supine: Mod assist   General bed mobility comments: assist for LLE mobility, increased time due to pain    Transfers Overall transfer level:  (pt declines attempts due to pain in L groin)                      Ambulation/Gait                  Stairs            Wheelchair Mobility     Tilt Bed    Modified Rankin (Stroke Patients Only)       Balance Overall balance assessment: Needs assistance Sitting-balance support: Single extremity supported, Feet supported Sitting balance-Leahy Scale: Poor Sitting balance - Comments: right lateral lean, pt does not shift weight onto L pelvis due to pain Postural control: Right lateral lean                                   Pertinent Vitals/Pain Pain Assessment Pain Assessment: Faces Faces Pain Scale: Hurts whole lot Pain Location: L groin Pain Descriptors / Indicators: Aching Pain Intervention(s): Monitored during session    Home Living Family/patient expects to be discharged to:: Skilled nursing facility Living Arrangements: Other relatives                 Additional Comments: pt was living in a one level home with his neice recently, ramped entrance. He has a  manual wheelchair, RW, BSC and hoyer lift. Pt reports he lacked sufficient support to mobilize in the home and had been lying in bed for the last month. Pt would like to try to go to rehab at [facility] in [place]    Prior Function Prior Level of Function : Needs assist             Mobility Comments: pt reports last standing ~1 month ago when first moving into his nieces home. He last ambulated in March when leaving rehab in [place]. Pt has been bed bound for last month due to weakness and lack of caregive support ADLs Comments: requiring  assistance for ADLs     Extremity/Trunk Assessment   Upper Extremity Assessment Upper Extremity Assessment: Overall WFL for tasks assessed    Lower Extremity Assessment Lower Extremity Assessment: RLE deficits/detail;LLE deficits/detail RLE Deficits / Details: grossly 4-/5, pt lacking ~5-10 degrees of full knee extension LLE Deficits / Details: pt with 3-/5 DF/PF, PROM WFL, knee flexion/extension grossly 3-/5 with pain limiting. Assessment of hip strength/ROM also limited by pain    Cervical / Trunk Assessment Cervical / Trunk Assessment: Normal  Communication   Communication Communication: No apparent difficulties Cueing Techniques: Verbal cues  Cognition Arousal: Alert Behavior During Therapy: WFL for tasks assessed/performed Overall Cognitive Status: Within Functional Limits for tasks assessed                                          General Comments General comments (skin integrity, edema, etc.): VSS on RA    Exercises     Assessment/Plan    PT Assessment Patient needs continued PT services  PT Problem List Decreased strength;Decreased activity tolerance;Decreased balance;Decreased mobility;Decreased knowledge of use of DME;Pain       PT Treatment Interventions DME instruction;Functional mobility training;Therapeutic activities;Therapeutic exercise;Balance training;Neuromuscular re-education;Patient/family education;Wheelchair mobility training    PT Goals (Current goals can be found in the Care Plan section)  Acute Rehab PT Goals Patient Stated Goal: to return to standing PT Goal Formulation: With patient Time For Goal Achievement: 04/28/23 Potential to Achieve Goals: Fair    Frequency Min 1X/week     Co-evaluation               AM-PAC PT "6 Clicks" Mobility  Outcome Measure Help needed turning from your back to your side while in a flat bed without using bedrails?: A Little Help needed moving from lying on your back to sitting on  the side of a flat bed without using bedrails?: A Lot Help needed moving to and from a bed to a chair (including a wheelchair)?: Total Help needed standing up from a chair using your arms (e.g., wheelchair or bedside chair)?: Total Help needed to walk in hospital room?: Total Help needed climbing 3-5 steps with a railing? : Total 6 Click Score: 9    End of Session   Activity Tolerance: Patient limited by pain Patient left: in bed;with call bell/phone within reach;with bed alarm set Nurse Communication: Mobility status;Need for lift equipment PT Visit Diagnosis: Other abnormalities of gait and mobility (R26.89);Muscle weakness (generalized) (M62.81);Pain Pain - Right/Left: Right Pain - part of body: Hip    Time: 4098-1191 PT Time Calculation (min) (ACUTE ONLY): 17 min   Charges:   PT Evaluation $PT Eval Low Complexity: 1 Low   PT General Charges $$ ACUTE PT VISIT: 1 Visit  Arlyss Gandy, PT, DPT Acute Rehabilitation Office [phone]   Arlyss Gandy 04/14/2023, 10:08 AM

## 2023-04-14 NOTE — Plan of Care (Signed)

## 2023-04-14 NOTE — Progress Notes (Signed)
Pt receives out-pt HD at [facility] on MWF 6:45 am chair time. Will assist as needed.   Olivia Canter Renal Navigator [phone]

## 2023-04-14 NOTE — Progress Notes (Signed)
Received patient in bed to unit.  Alert and oriented.  Informed consent signed and in chart.   TX duration:3.5  Patient tolerated well.  Transported back to the room  Alert, without acute distress.  Hand-off given to patient's nurse.   Access used: right HD cath Access issues: none  Total UF removed: 1L Medication(s) given: tylenol   04/14/23 1544  Vitals  BP 132/78  BP Location Right Arm  BP Method Automatic  Patient Position (if appropriate) Lying  Pulse Rate 99  Resp 19  Oxygen Therapy  SpO2 96 %  O2 Device Room Air  During Treatment Monitoring  Blood Flow Rate (mL/min) 200 mL/min  Arterial Pressure (mmHg) 14.75 mmHg  Venous Pressure (mmHg) 170.9 mmHg  TMP (mmHg) 0 mmHg  Ultrafiltration Rate (mL/min) 533 mL/min  Dialysate Flow Rate (mL/min) 300 ml/min  Dialysate Potassium Concentration 3  Dialysate Calcium Concentration 2.5  Duration of HD Treatment -hour(s) 3.5 hour(s)  Cumulative Fluid Removed (mL) per Treatment  1000.11  HD Safety Checks Performed Yes  Intra-Hemodialysis Comments Tx completed;Tolerated well  Dialysis Fluid Bolus Normal Saline  Bolus Amount (mL) 300 mL      Jinan Biggins [facility]

## 2023-04-14 NOTE — Plan of Care (Signed)

## 2023-04-15 ENCOUNTER — Ambulatory Visit (HOSPITAL_BASED_OUTPATIENT_CLINIC_OR_DEPARTMENT_OTHER): Payer: Medicare Other | Admitting: General Surgery

## 2023-04-15 DIAGNOSIS — K6139 Other ischiorectal abscess: Secondary | ICD-10-CM | POA: Diagnosis not present

## 2023-04-15 DIAGNOSIS — I480 Paroxysmal atrial fibrillation: Secondary | ICD-10-CM | POA: Diagnosis not present

## 2023-04-15 DIAGNOSIS — M25552 Pain in left hip: Secondary | ICD-10-CM | POA: Diagnosis not present

## 2023-04-15 DIAGNOSIS — R6510 Systemic inflammatory response syndrome (SIRS) of non-infectious origin without acute organ dysfunction: Secondary | ICD-10-CM | POA: Diagnosis not present

## 2023-04-15 DIAGNOSIS — N186 End stage renal disease: Secondary | ICD-10-CM | POA: Diagnosis not present

## 2023-04-15 LAB — HEPATITIS B SURFACE ANTIBODY, QUANTITATIVE: Hep B S AB Quant (Post): 598 m[IU]/mL

## 2023-04-15 MED ORDER — CHLORHEXIDINE GLUCONATE CLOTH 2 % EX PADS
6.0000 | MEDICATED_PAD | Freq: Every day | CUTANEOUS | Status: DC
  Administered 2023-04-16 – 2023-04-18 (×3): 6 via TOPICAL

## 2023-04-15 NOTE — TOC Initial Note (Signed)
Transition of Care [facility]) - Initial/Assessment Note    Patient Details  Name: Justin [name] MRN: [id number] Date of Birth: Jan 30, 1946  Transition of Care [facility]) CM/SW Contact:    Kielyn Kardell A [place], Theresia Majors Phone Number: 04/15/2023, 10:47 AM  Clinical Narrative:                  CSW met with pt at bedside to discuss SNF referral. Pt was agreeable to SNF and requested [facility] as preference. SNF workup completed.   CSW also contacted pt's daughter, Revonda Standard who informed CSW that pt was living with niece and pt does not want to return.   TOC will continue to follow.   Expected Discharge Plan: Skilled Nursing Facility Barriers to Discharge: Continued Medical Work up, English as a second language teacher, SNF Pending bed offer   Patient Goals and CMS Choice Patient states their goals for this hospitalization and ongoing recovery are:: Go to [place] for rehab          Expected Discharge Plan and Services In-house Referral: Clinical Social Work   Post Acute Care Choice: Skilled Nursing Facility Living arrangements for the past 2 months: Single Family Home                                      Prior Living Arrangements/Services Living arrangements for the past 2 months: Single Family Home Lives with:: Relatives                   Activities of Daily Living   ADL Screening (condition at time of admission) Independently performs ADLs?: No Does the patient have a NEW difficulty with bathing/dressing/toileting/self-feeding that is expected to last >3 days?: Yes (Initiates electronic notice to provider for possible OT consult) Does the patient have a NEW difficulty with getting in/out of bed, walking, or climbing stairs that is expected to last >3 days?: Yes (Initiates electronic notice to provider for possible PT consult) Does the patient have a NEW difficulty with communication that is expected to last >3 days?: No Is the patient deaf or have difficulty hearing?: No Does  the patient have difficulty seeing, even when wearing glasses/contacts?: Yes Does the patient have difficulty concentrating, remembering, or making decisions?: No  Permission Sought/Granted                  Emotional Assessment Appearance:: Appears older than stated age Attitude/Demeanor/Rapport: Engaged Affect (typically observed): Appropriate Orientation: : Oriented to Self, Oriented to Situation, Oriented to Place Alcohol / Substance Use: Not Applicable Psych Involvement: No (comment)  Admission diagnosis:  Left leg weakness [R29.898] 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] Dysphagia [R13.10] Acute hip pain, left [M25.552] Community acquired pneumonia of left lower lobe of lung [J18.9] Degeneration of intervertebral disc of lumbar region, unspecified whether pain present [M51.369] Patient Active Problem List   Diagnosis Date Noted   History of GI bleed 04/13/2023   Acute hip pain, left 04/13/2023   SIRS (systemic inflammatory response syndrome) (HCC) 04/13/2023   Ambulatory dysfunction 04/13/2023   History of cholecystitis 04/13/2023   Dysphagia 04/13/2023   Shock (HCC) 12/22/2022   Rectal ulcer 12/17/2022   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   Hemorrhagic shock (HCC) 12/14/2022   Acute encephalopathy 11/09/2022   Syncope 11/09/2022   Acute blood loss anemia 11/09/2022   Melena 11/09/2022   History of colonic polyps 11/09/2022   End stage renal disease (HCC) 11/09/2022   Gastric  nodule 11/09/2022   Rectal bleeding 09/17/2022   Colon ulcer 09/17/2022   Lower GI bleeding 09/17/2022   GI bleed 09/16/2022   Prolonged QT interval 09/16/2022   History of prostate cancer 09/16/2022   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   History of colon polyps 09/16/2022   Lower GI bleed 09/12/2022   Acute GI bleeding 09/10/2022   Wide-complex tachycardia 06/17/2022   Hypotension 06/13/2022   Obesity (BMI 30-39.9) 06/13/2022   Perirectal abscess 06/13/2022   Gouty arthropathy  03/03/2022   Murmur, cardiac 01/27/2022   Chronic back pain 01/27/2022   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/27/2022   GERD (gastroesophageal reflux disease) 11/11/2021   High risk medication use 11/11/2021   History of cardioversion 11/11/2021   Paroxysmal atrial fibrillation with RVR (HCC) 11/11/2021   PVC (premature ventricular contraction) 11/11/2021   Nonrheumatic aortic valve stenosis 11/06/2021   Mixed hyperlipidemia 11/06/2021   Abdominal mass 05/05/2021   First degree AV block 07/13/2020   Paroxysmal A-fib (HCC) 01/29/2020   Wound infection 01/28/2020   H/O vitrectomy 12/07/2019   Macular pucker, 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 determined by examination associated with type 2 diabetes mellitus (HCC) 10/24/2019   Stable treated proliferative diabetic retinopathy of left eye determined by examination associated with type 2 diabetes mellitus (HCC) 10/24/2019   ESRD on hemodialysis (HCC) 07/29/2017   Anticoagulated 07/13/2017   Malignant neoplasm of prostate (HCC) 07/12/2017   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   Coagulation defect, unspecified (HCC) 01/08/2017   Idiopathic gout 01/08/2017   Acute on chronic blood loss anemia 11/20/2015   Blurred vision, left eye 07/10/2011   Essential hypertension 07/10/2011   DM (diabetes mellitus) (HCC) 07/10/2011   PCP:  Pcp, No Pharmacy:   [id number] [place], [place] - [address] AT [facility] [address] [place] [address] Phone: [phone] Fax: [phone]  [facility] [address] [place] [address] Phone: [phone] Fax: [phone]     Social Determinants of Health (SDOH) Social History: SDOH Screenings   Food Insecurity: Food Insecurity Present (04/13/2023)  Housing: Low Risk  (04/13/2023)  Transportation Needs:  Unmet Transportation Needs (04/13/2023)  Utilities: Not At Risk (04/13/2023)  Social Connections: Unknown (10/13/2022)   Received from [facility]  Stress: No Stress Concern Present (10/14/2022)   Received from [facility]  Tobacco Use: Medium Risk (04/12/2023)   SDOH Interventions:     Readmission Risk Interventions    03/30/2023    2:29 PM 11/14/2022   10:08 AM 09/18/2022    4:10 PM  Readmission Risk Prevention Plan  Transportation Screening Complete Complete Complete  Medication Review (RN Care Manager) Referral to Pharmacy Complete Complete  PCP or Specialist appointment within 3-5 days of discharge Complete Complete Complete  HRI or Home Care Consult Complete Complete Complete  SW Recovery Care/Counseling Consult Complete Complete   Palliative Care Screening Not Applicable Not Applicable Complete  Skilled Nursing Facility Not Applicable Complete Not Applicable

## 2023-04-15 NOTE — Progress Notes (Signed)
PROGRESS NOTE    Justin [name]  WUJ:[id number] DOB: Jul 24, 1945 DOA: 04/12/2023 PCP: Pcp, No   Brief Narrative:   77 year old with history of ESRD on HD, GIB, rectal abscess, cholecystitis with percutaneous drain, diabetes mellitus type 2 diet-controlled, chronic hypotension on midodrine, ambulatory dysfunction/wheelchair-bound for the last 3-4 months was admitted to the hospital last month for ESRD and septic shock.  Patient was noted to have 3.5 cm ischial abscess drained by IR, cultures were negative completed antibiotic course in the hospital and was discharged on September 26.  Patient now presented back to the hospital again with persistent left groin pain.  IR reviewed images, no obvious area requiring drainage.  Currently on empiric antibiotics. ID consulted.    Assessment & Plan:  Principal Problem:   Acute hip pain, left Active Problems:   SIRS (systemic inflammatory response syndrome) (HCC)   ESRD on hemodialysis (HCC)   Paroxysmal atrial fibrillation with RVR (HCC)   History of GI bleed   Ambulatory dysfunction   History of cholecystitis   Dysphagia    Sepsis secondary to acute left  groin ischial abscess Klebsiella bacteremia. Leukocytosis. At baseline he has been nonambulatory for the past several months.  CT scan of the left hip again shows small fluid collection but after reviewing by IR it is not sufficient for any sort of aspiration therefore recommending IV antibiotics.  ID on board.  Currently on cefepime and oral metronidazole.  Vancomycin was discontinued.  Check ESR CRP.  Follow culture and sensitivity.    Paroxysmal atrial fibrillation with RVR (HCC) Continue amiodarone, digoxin, Eliquis.  Rate controlled at this time.   ESRD on hemodialysis  Continue midodrine for chronic hypotension.  Nephrology on board for hemodialysis needs.  Blood pressure stable.   History of cholecystitis Status post percutaneous cholecystostomy tube in place changed to regularly at  [facility]. Saw general surg on 9/11 to consider cholecystectomy, they felt risk would be prohibitive though.   History of GI bleed Anemia of chronic disease No current evidence.  On Eliquis.  Deconditioning, debility.  PT OT has seen the patient and recommend skilled nursing facility placement.  Has a wheelchair at baseline.  Pressure injury.  Present on admission.  Continue wound care.  Pressure Injury 12/14/22 Heel Left;Posterior Unstageable - Full thickness tissue loss in which the base of the injury is covered by slough (yellow, tan, gray, green or brown) and/or eschar (tan, brown or black) in the wound bed. (Active)  12/14/22 0700  Location: Heel  Location Orientation: Left;Posterior  Staging: Unstageable - Full thickness tissue loss in which the base of the injury is covered by slough (yellow, tan, gray, green or brown) and/or eschar (tan, brown or black) in the wound bed.  Wound Description (Comments):   Present on Admission: Yes     Pressure Injury 12/14/22 Heel Right Unstageable - Full thickness tissue loss in which the base of the injury is covered by slough (yellow, tan, gray, green or brown) and/or eschar (tan, brown or black) in the wound bed. (Active)  12/14/22 0700  Location: Heel  Location Orientation: Right  Staging: Unstageable - Full thickness tissue loss in which the base of the injury is covered by slough (yellow, tan, gray, green or brown) and/or eschar (tan, brown or black) in the wound bed.  Wound Description (Comments):   Present on Admission: Yes    DVT prophylaxis: Eliquis  Consults performed.   Interventional radiology Infectious disease Nephrology.  Procedures. Hemodialysis  Code Status: DNR  Family Communication:   None at bedside.  Subjective:   Physical examination: Body mass index is 25.18 kg/m.  General:  Average built, not in obvious distress HENT:   No scleral pallor or icterus noted. Oral mucosa is moist.  Chest:   Diminished breath  sounds bilaterally. No crackles or wheezes.  Right subclavian hemodialysis catheter in place CVS: S1 &S2 heard. No murmur.  Regular rate and rhythm. Abdomen: Soft, nontender, nondistended.  Bowel sounds are heard.  Cholecystostomy drain in place Extremities: No cyanosis, clubbing or edema.  Unable to move the left lower extremity with tenderness on palpation. Psych: Alert, awake and oriented, normal mood CNS:  No cranial nerve deficits.  Power equal in all extremities.   Skin: Warm and dry.  Pressure injury of the left heel right heel   Objective: Vitals:   04/15/23 0054 04/15/23 0517 04/15/23 0522 04/15/23 0735  BP: (!) 91/53  (!) 117/90 (!) 94/54  Pulse: 74  79 84  Resp: 17  17 18   Temp: 97.8 F (36.6 C)  98.5 F (36.9 C) 98 F (36.7 C)  TempSrc: Axillary  Oral   SpO2: 94%  96% 93%  Weight:  79.6 kg    Height:        Intake/Output Summary (Last 24 hours) at 04/15/2023 1142 Last data filed at 04/15/2023 0334 Gross per 24 hour  Intake 200 ml  Output 1400 ml  Net -1200 ml   Filed Weights   04/14/23 1140 04/14/23 1555 04/15/23 0517  Weight: 81.4 kg 80.2 kg 79.6 kg    Scheduled Meds:  amiodarone  200 mg Oral Daily   apixaban  2.5 mg Oral BID   atorvastatin  40 mg Oral QHS   calcitRIOL  0.25 mcg Oral Daily   Chlorhexidine Gluconate Cloth  6 each Topical Daily   cinacalcet  30 mg Oral Q supper   digoxin  0.0625 mg Oral QODAY   metroNIDAZOLE  500 mg Oral Q12H   midodrine  20 mg Oral TID WC   pregabalin  75 mg Oral BID   sevelamer carbonate  800 mg Oral TID WC   Continuous Infusions:  cefTRIAXone (ROCEPHIN)  IV Stopped (04/14/23 2233)     Data Reviewed:   CBC: Recent Labs  Lab 04/12/23 1837 04/13/23 0601 04/14/23 1151  WBC 16.9* 14.9* 7.2  HGB 10.2* 9.3* 9.0*  HCT 31.9* 30.2* 28.0*  MCV 96.7 96.8 96.6  PLT 204 199 193   Basic Metabolic Panel: Recent Labs  Lab 04/12/23 1837 04/13/23 0601 04/14/23 1151  NA 139 139 140  K 4.2 4.0 3.6  CL 100 103 100   CO2 23 22 22   GLUCOSE 89 72 107*  BUN 45* 51* 63*  CREATININE 8.57* 8.94* 10.31*  CALCIUM 9.1 8.8* 8.5*  PHOS  --   --  5.4*   GFR: Estimated Creatinine Clearance: 6 mL/min (A) (by C-G formula based on SCr of 10.31 mg/dL (H)). Liver Function Tests: Recent Labs  Lab 04/14/23 1151  ALBUMIN 2.1*   No results for input(s): "LIPASE", "AMYLASE" in the last 168 hours. No results for input(s): "AMMONIA" in the last 168 hours. Coagulation Profile: No results for input(s): "INR", "PROTIME" in the last 168 hours. Cardiac Enzymes: No results for input(s): "CKTOTAL", "CKMB", "CKMBINDEX", "TROPONINI" in the last 168 hours. BNP (last 3 results) No results for input(s): "PROBNP" in the last 8760 hours. HbA1C: No results for input(s): "HGBA1C" in the last 72 hours. CBG: Recent Labs  Lab 04/12/23 1844  GLUCAP 87   Lipid Profile: No results for input(s): "CHOL", "HDL", "LDLCALC", "TRIG", "CHOLHDL", "LDLDIRECT" in the last 72 hours. Thyroid Function Tests: No results for input(s): "TSH", "T4TOTAL", "FREET4", "T3FREE", "THYROIDAB" in the last 72 hours. Anemia Panel: No results for input(s): "VITAMINB12", "FOLATE", "FERRITIN", "TIBC", "IRON", "RETICCTPCT" in the last 72 hours. Sepsis Labs: Recent Labs  Lab 04/13/23 0601  PROCALCITON 7.08    Recent Results (from the past 240 hour(s))  Blood culture (routine x 2)     Status: Abnormal (Preliminary result)   Collection Time: 04/12/23  7:08 PM   Specimen: BLOOD RIGHT HAND  Result Value Ref Range Status   Specimen Description BLOOD RIGHT HAND  Final   Special Requests   Final    BOTTLES DRAWN AEROBIC AND ANAEROBIC Blood Culture adequate volume   Culture  Setup Time   Final    GRAM NEGATIVE RODS IN BOTH AEROBIC AND ANAEROBIC BOTTLES CRITICAL RESULT CALLED TO, READ BACK BY AND VERIFIED WITH: B. AGEE PHARMD, AT 1041 04/13/23 D. VANHOOK    Culture (A)  Final    KLEBSIELLA PNEUMONIAE CONFIRMATION OF SUSCEPTIBILITIES IN PROGRESS Performed  at [facility], [address], [place], [place] [address]    Report Status PENDING  Incomplete  Blood Culture ID Panel (Reflexed)     Status: Abnormal   Collection Time: 04/12/23  7:08 PM  Result Value Ref Range Status   Enterococcus faecalis NOT DETECTED NOT DETECTED Final   Enterococcus Faecium NOT DETECTED NOT DETECTED Final   Listeria monocytogenes NOT DETECTED NOT DETECTED Final   Staphylococcus species NOT DETECTED NOT DETECTED Final   Staphylococcus aureus (BCID) NOT DETECTED NOT DETECTED Final   Staphylococcus epidermidis NOT DETECTED NOT DETECTED Final   Staphylococcus lugdunensis NOT DETECTED NOT DETECTED Final   Streptococcus species NOT DETECTED NOT DETECTED Final   Streptococcus agalactiae NOT DETECTED NOT DETECTED Final   Streptococcus pneumoniae NOT DETECTED NOT DETECTED Final   Streptococcus pyogenes NOT DETECTED NOT DETECTED Final   A.calcoaceticus-baumannii NOT DETECTED NOT DETECTED Final   Bacteroides fragilis NOT DETECTED NOT DETECTED Final   Enterobacterales DETECTED (A) NOT DETECTED Final    Comment: Enterobacterales represent a large order of gram negative bacteria, not a single organism. CRITICAL RESULT CALLED TO, READ BACK BY AND VERIFIED WITH: B. AGEE PHARMD, AT 1041 04/13/23 D. VANHOOK    Enterobacter cloacae complex NOT DETECTED NOT DETECTED Final   Escherichia coli NOT DETECTED NOT DETECTED Final   Klebsiella aerogenes NOT DETECTED NOT DETECTED Final   Klebsiella oxytoca NOT DETECTED NOT DETECTED Final   Klebsiella pneumoniae DETECTED (A) NOT DETECTED Final    Comment: CRITICAL RESULT CALLED TO, READ BACK BY AND VERIFIED WITH: B. AGEE PHARMD, AT 1041 04/13/23 D. VANHOOK    Proteus species NOT DETECTED NOT DETECTED Final   Salmonella species NOT DETECTED NOT DETECTED Final   Serratia marcescens NOT DETECTED NOT DETECTED Final   Haemophilus influenzae NOT DETECTED NOT DETECTED Final   Neisseria meningitidis NOT DETECTED NOT DETECTED Final    Pseudomonas aeruginosa NOT DETECTED NOT DETECTED Final   Stenotrophomonas maltophilia NOT DETECTED NOT DETECTED Final   Candida albicans NOT DETECTED NOT DETECTED Final   Candida auris NOT DETECTED NOT DETECTED Final   Candida glabrata NOT DETECTED NOT DETECTED Final   Candida krusei NOT DETECTED NOT DETECTED Final   Candida parapsilosis NOT DETECTED NOT DETECTED Final   Candida tropicalis NOT DETECTED NOT DETECTED Final   Cryptococcus neoformans/gattii NOT DETECTED NOT DETECTED Final  CTX-M ESBL NOT DETECTED NOT DETECTED Final   Carbapenem resistance IMP NOT DETECTED NOT DETECTED Final   Carbapenem resistance KPC NOT DETECTED NOT DETECTED Final   Carbapenem resistance NDM NOT DETECTED NOT DETECTED Final   Carbapenem resist OXA 48 LIKE NOT DETECTED NOT DETECTED Final   Carbapenem resistance VIM NOT DETECTED NOT DETECTED Final    Comment: Performed at [facility], [address], [place], [place] [address]  Blood culture (routine x 2)     Status: Abnormal (Preliminary result)   Collection Time: 04/12/23  7:38 PM   Specimen: BLOOD  Result Value Ref Range Status   Specimen Description BLOOD SITE NOT SPECIFIED  Final   Special Requests   Final    BOTTLES DRAWN AEROBIC AND ANAEROBIC Blood Culture adequate volume   Culture  Setup Time   Final    GRAM NEGATIVE RODS IN BOTH AEROBIC AND ANAEROBIC BOTTLES CRITICAL RESULT CALLED TO, READ BACK BY AND VERIFIED WITH: BTilda Burrow PHARMD, AT 1041 04/13/23 Renato Shin Performed at [facility], [address] [place], [place] [address]    Culture KLEBSIELLA PNEUMONIAE (A)  Final   Report Status PENDING  Incomplete       Radiology Studies: No results found.     LOS: 2 days   Joycelyn Das, MD [facility] If 7PM-7AM, please contact night-coverage  04/15/2023, 11:42 AM

## 2023-04-15 NOTE — Evaluation (Signed)
Occupational Therapy Evaluation Patient Details Name: Justin [name] MRN: [id number] DOB: Mar 18, 1946 Today's Date: 04/15/2023   History of Present Illness 77 y.o. male presents to [facility] hospital on 04/12/2023 with persistent L groin pain. Pt recently admitted in September for management of sepsis and ischial abscess. PMH includes ESRD on HD, GIB, rectal abscess, cholecystitis with drain, DMII, chronic hypotension, ambulatory dysfunction.   Clinical Impression   Pt has essentially been bed bound x 1 month and requiring assistance for bathing, dressing, toileting and all IADLs. Pt without complaints of pain this visit. Reports this is the bed he has felt in a long time. Able to stand with RW from elevated surface x 2 with heavy mod assist and maintain x 10 seconds. Pt requires assist for LEs back into bed. Pt needs up to total assist for ADLs. Patient will benefit from continued inpatient follow up therapy, <3 hours/day.       If plan is discharge home, recommend the following: Two people to help with walking and/or transfers;A lot of help with bathing/dressing/bathroom;Assistance with cooking/housework;Assist for transportation;Help with stairs or ramp for entrance    Functional Status Assessment  Patient has had a recent decline in their functional status and demonstrates the ability to make significant improvements in function in a reasonable and predictable amount of time.  Equipment Recommendations  Other (comment) (defer to next venue)    Recommendations for Other Services       Precautions / Restrictions Precautions Precautions: Fall Precaution Comments: RLQ cholecystitis drain Restrictions Weight Bearing Restrictions: No      Mobility Bed Mobility Overal bed mobility: Needs Assistance Bed Mobility: Supine to Sit, Sit to Supine     Supine to sit: Modified independent (Device/Increase time), Used rails Sit to supine: Mod assist   General bed mobility comments: pt lying cross  way in bed with LEs over EOB upon arrival, per pt this is his position of comfort, pulled up trunk using rail, assisted LEs back into bed    Transfers Overall transfer level: Needs assistance Equipment used: Rolling walker (2 wheels) Transfers: Sit to/from Stand Sit to Stand: Mod assist           General transfer comment: cues for hand placement, heavy mod assist from elevated bed to stand x 2      Balance Overall balance assessment: Needs assistance   Sitting balance-Leahy Scale: Fair       Standing balance-Leahy Scale: Zero Standing balance comment: mod assist and RW to maintain                           ADL either performed or assessed with clinical judgement   ADL Overall ADL's : Needs assistance/impaired Eating/Feeding: Independent;Sitting;Bed level   Grooming: Oral care;Sitting;Set up   Upper Body Bathing: Minimal assistance;Sitting   Lower Body Bathing: Total assistance;Sit to/from stand   Upper Body Dressing : Minimal assistance;Sitting   Lower Body Dressing: Total assistance;Sit to/from stand       Toileting- Architect and Hygiene: Total assistance;Bed level               Vision Ability to See in Adequate Light: 0 Adequate Patient Visual Report: No change from baseline       Perception         Praxis         Pertinent Vitals/Pain Pain Assessment Pain Assessment: No/denies pain     Extremity/Trunk Assessment Upper Extremity Assessment Upper Extremity Assessment:  Generalized weakness   Lower Extremity Assessment Lower Extremity Assessment: Defer to PT evaluation   Cervical / Trunk Assessment Cervical / Trunk Assessment: Other exceptions (weakness)   Communication Communication Communication: No apparent difficulties   Cognition Arousal: Alert Behavior During Therapy: WFL for tasks assessed/performed Overall Cognitive Status: Within Functional Limits for tasks assessed                                        General Comments       Exercises     Shoulder Instructions      Home Living Family/patient expects to be discharged to:: Skilled nursing facility Living Arrangements: Other relatives                               Additional Comments: pt was living in a one level home with his niece recently, ramped entrance. He has a manual wheelchair, RW, BSC and hoyer lift. Pt reports he lacked sufficient support to mobilize in the home and had been lying in bed for the last month. Pt would like to try to go to rehab at [facility] in [place]      Prior Functioning/Environment Prior Level of Function : Needs assist             Mobility Comments: pt reports last standing ~1 month ago when first moving into his nieces home. He last ambulated in March when leaving rehab in [place]. Pt has been bed bound for last month due to weakness and lack of caregiver support ADLs Comments: requiring assistance for ADLs and all IADLs        OT Problem List: Decreased strength;Impaired balance (sitting and/or standing);Decreased knowledge of use of DME or AE      OT Treatment/Interventions: Self-care/ADL training;DME and/or AE instruction;Therapeutic activities;Patient/family education;Balance training    OT Goals(Current goals can be found in the care plan section) Acute Rehab OT Goals OT Goal Formulation: With patient Time For Goal Achievement: 04/29/23 Potential to Achieve Goals: Good ADL Goals Pt Will Perform Upper Body Bathing: with supervision;sitting Pt Will Perform Upper Body Dressing: with supervision;sitting Pt Will Transfer to Toilet: with mod assist;stand pivot transfer Additional ADL Goal #1: Pt will perform bed mobility with min assist in preparation for ADLs. Additional ADL Goal #2: Pt will maintain static standing with UE support and min assist x 1 minute in preparation for ADLs.  OT Frequency: Min 1X/week    Co-evaluation               AM-PAC OT "6 Clicks" Daily Activity     Outcome Measure Help from another person eating meals?: None Help from another person taking care of personal grooming?: A Little Help from another person toileting, which includes using toliet, bedpan, or urinal?: Total Help from another person bathing (including washing, rinsing, drying)?: A Lot Help from another person to put on and taking off regular upper body clothing?: A Little Help from another person to put on and taking off regular lower body clothing?: Total 6 Click Score: 14   End of Session Equipment Utilized During Treatment: Gait belt;Rolling walker (2 wheels) Nurse Communication: Other (comment) (wants drain emptied)  Activity Tolerance: Patient tolerated treatment well Patient left: in bed;with call bell/phone within reach;with bed alarm set  OT Visit Diagnosis: Unsteadiness on feet (R26.81);Pain;Muscle weakness (generalized) (M62.81)  Time: 1410-1439 OT Time Calculation (min): 29 min Charges:  OT General Charges $OT Visit: 1 Visit OT Evaluation $OT Eval Moderate Complexity: 1 Mod OT Treatments $Self Care/Home Management : 8-22 mins  Berna Spare, OTR/L Acute Rehabilitation Services Office: [phone]   Evern Bio 04/15/2023, 3:48 PM

## 2023-04-15 NOTE — Progress Notes (Addendum)
[facility]    Date of Admission:  04/12/2023   Total days of antibiotics 4   ID: Justin [name] is a 77 y.o. male with ischial osteo/inschial abscess and kleb bacteremia Principal Problem:   Acute hip pain, left Active Problems:   ESRD on hemodialysis (HCC)   Paroxysmal atrial fibrillation with RVR (HCC)   History of GI bleed   SIRS (systemic inflammatory response syndrome) (HCC)   Ambulatory dysfunction   History of cholecystitis   Dysphagia    Subjective: Afebrile still has occasional buttock pain  Medications:   amiodarone  200 mg Oral Daily   apixaban  2.5 mg Oral BID   atorvastatin  40 mg Oral QHS   calcitRIOL  0.25 mcg Oral Daily   Chlorhexidine Gluconate Cloth  6 each Topical Daily   [START ON 04/16/2023] Chlorhexidine Gluconate Cloth  6 each Topical Q0600   cinacalcet  30 mg Oral Q supper   digoxin  0.0625 mg Oral QODAY   metroNIDAZOLE  500 mg Oral Q12H   midodrine  20 mg Oral TID WC   pregabalin  75 mg Oral BID   sevelamer carbonate  800 mg Oral TID WC    Objective: Vital signs in last 24 hours: Temp:  [97.8 F (36.6 C)-99.3 F (37.4 C)] 98.3 F (36.8 C) (10/03 1555) Pulse Rate:  [63-84] 72 (10/03 1555) Resp:  [17-18] 18 (10/03 1555) BP: (91-117)/(53-90) 95/54 (10/03 1555) SpO2:  [93 %-100 %] 96 % (10/03 1555) Weight:  [79.6 kg] 79.6 kg (10/03 0517) Physical Exam  Constitutional: He is oriented to person, place, and time. He appears well-developed and well-nourished. No distress.  HENT:  Mouth/Throat: Oropharynx is clear and moist. No oropharyngeal exudate.  Cardiovascular: Normal rate, regular rhythm and normal heart sounds. Exam reveals no gallop and no friction rub.  No murmur heard.  Chest wall = hd catheter in place Pulmonary/Chest: Effort normal and breath sounds normal. No respiratory distress. He has no wheezes.  Abdominal: Soft. Bowel sounds are normal. He exhibits no distension. There is no tenderness.   Lymphadenopathy:  He has no cervical adenopathy.  Neurological: He is alert and oriented to person, place, and time.  Skin: Skin is warm and dry. No rash noted. No erythema.  Psychiatric: He has a normal mood and affect. His behavior is normal.    Lab Results Recent Labs    04/13/23 0601 04/14/23 1151  WBC 14.9* 7.2  HGB 9.3* 9.0*  HCT 30.2* 28.0*  NA 139 140  K 4.0 3.6  CL 103 100  CO2 22 22  BUN 51* 63*  CREATININE 8.94* 10.31*   Liver Panel Recent Labs    04/14/23 1151  ALBUMIN 2.1*   Sedimentation Rate Recent Labs    04/12/23 1907  ESRSEDRATE 65*   C-Reactive Protein Recent Labs    04/12/23 1837  CRP 14.3*    Microbiology: Kleb sensitivities  Culture KLEBSIELLA PNEUMONIAE Abnormal   Report Status 04/16/2023 FINAL  Organism ID, Bacteria KLEBSIELLA PNEUMONIAE  Resulting Agency [facility]     Susceptibility    Klebsiella pneumoniae    MIC    AMPICILLIN >=32 RESIST... Resistant    AMPICILLIN/SULBACTAM >=32 RESIST... Resistant    CEFEPIME <=0.12 SENS... Sensitive    CEFTAZIDIME <=1 SENSITIVE Sensitive    CEFTRIAXONE <=0.25 SENS... Sensitive    CIPROFLOXACIN <=0.25 SENS... Sensitive    GENTAMICIN <=1 SENSITIVE Sensitive    IMIPENEM <=0.25 SENS... Sensitive    PIP/TAZO >=  128 RESIS... Resistant    TRIMETH/SULFA <=20 SENSIT... Sensitive        Studies/Results: No results found. Lab Results  Component Value Date   ESRSEDRATE 65 (H) 04/12/2023     Assessment/Plan: Kleb bacteremia and ischial osteomyelitis = plan to treat with 6 wk of abtx with hemodialysis.   See if IR can aspirate small fluid collection  Continue on ceftriaxone and metronidazole; plan to change to ceftaz with hd; end of treatment will be November through 11th.  Esrd on hd = please do line holiday due to bacteremia. Continue with schedule on m-w-f  Will see back in the ID clinic in 6 wk on nov 14th.  [facility] Infect[facility] Pager:  [phone]  04/15/2023, 5:02 PM

## 2023-04-15 NOTE — Plan of Care (Signed)

## 2023-04-15 NOTE — [place] FL2 (Addendum)
[place] MEDICAID FL2 LEVEL OF CARE FORM     IDENTIFICATION  Patient Name: Justin [name] Birthdate: [birth year] Sex: male Admission Date (Current Location): 04/12/2023  [facility] and [place] Number:  Recruitment consultant and Address:  [facility], [address] [place], [place] [address]      Provider Number: [id number]  Attending Physician Name and Address:  Joycelyn Das, MD  Relative Name and Phone Number:  Beverley Fiedler (Daughter)  [phone]    Current Level of Care: Hospital Recommended Level of Care: Skilled Nursing Facility Prior Approval Number:    Date Approved/Denied:   PASRR Number: [id number]  Discharge Plan: SNF    Current Diagnoses: Patient Active Problem List   Diagnosis Date Noted   History of GI bleed 04/13/2023   Acute hip pain, left 04/13/2023   SIRS (systemic inflammatory response syndrome) (HCC) 04/13/2023   Ambulatory dysfunction 04/13/2023   History of cholecystitis 04/13/2023   Dysphagia 04/13/2023   Shock (HCC) 12/22/2022   Rectal ulcer 12/17/2022   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   Hemorrhagic shock (HCC) 12/14/2022   Acute encephalopathy 11/09/2022   Syncope 11/09/2022   Acute blood loss anemia 11/09/2022   Melena 11/09/2022   History of colonic polyps 11/09/2022   End stage renal disease (HCC) 11/09/2022   Gastric nodule 11/09/2022   Rectal bleeding 09/17/2022   Colon ulcer 09/17/2022   Lower GI bleeding 09/17/2022   GI bleed 09/16/2022   Prolonged QT interval 09/16/2022   History of prostate cancer 09/16/2022   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   History of colon polyps 09/16/2022   Lower GI bleed 09/12/2022   Acute GI bleeding 09/10/2022   Wide-complex tachycardia 06/17/2022   Hypotension 06/13/2022   Obesity (BMI 30-39.9) 06/13/2022   Perirectal abscess 06/13/2022   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   Murmur, cardiac 01/27/2022   Chronic back pain 01/27/2022   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) 01/27/2022   GERD (gastroesophageal reflux disease) 11/11/2021   High risk medication use 11/11/2021   History of cardioversion 11/11/2021   Paroxysmal atrial fibrillation with RVR (HCC) 11/11/2021   PVC (premature ventricular contraction) 11/11/2021   Nonrheumatic aortic valve stenosis 11/06/2021   Mixed hyperlipidemia 11/06/2021   Abdominal mass 05/05/2021   First degree AV block 07/13/2020   Paroxysmal A-fib (HCC) 01/29/2020   Wound infection 01/28/2020   H/O vitrectomy 12/07/2019   Macular pucker, 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 determined by examination associated with type 2 diabetes mellitus (HCC) 10/24/2019   Stable treated proliferative diabetic retinopathy of left eye determined by examination associated with type 2 diabetes mellitus (HCC) 10/24/2019   ESRD on hemodialysis (HCC) 07/29/2017   Anticoagulated 07/13/2017   Malignant neoplasm of prostate (HCC) 07/12/2017   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   Coagulation defect, unspecified (HCC) 01/08/2017   Idiopathic gout 01/08/2017   Acute on chronic blood loss anemia 11/20/2015   Blurred vision, left eye 07/10/2011   Essential hypertension 07/10/2011   DM (diabetes mellitus) (HCC) 07/10/2011    Orientation RESPIRATION BLADDER Height & Weight     Self, Time, Place, Situation  Normal Incontinent Weight: 175 lb 7.8 oz (79.6 kg) Height:  5\' 10"  (177.8 cm)  BEHAVIORAL SYMPTOMS/MOOD NEUROLOGICAL BOWEL NUTRITION STATUS      Incontinent Diet (see discharge summary)  AMBULATORY STATUS COMMUNICATION OF NEEDS Skin   Extensive Assist Verbally PU Stage and Appropriate Care (Pressure Injury  12/14/22 Heel Left;Posterior Unstageable - Full thickness tissue loss.Pressure Injury 12/14/22 Heel Right Unstageable - Full thickness tissue loss.Avulsion Shoulder Left Dry scabed over. Non-pressure wound Anus Medial healing)                        Personal Care Assistance Level of Assistance  Bathing, Dressing, Feeding Bathing Assistance: Maximum assistance Feeding assistance: Independent Dressing Assistance: Maximum assistance     Functional Limitations Info  Sight, Hearing, Speech Sight Info: Adequate Hearing Info: Adequate Speech Info: Adequate    SPECIAL CARE FACTORS FREQUENCY  PT (By licensed PT), OT (By licensed OT)     PT Frequency: 5x/week OT Frequency: 5x/week            Contractures Contractures Info: Not present    Additional Factors Info  Code Status, Allergies Code Status Info: DNR-Intervene Allergies Info: No Known Allergies           Current Medications (04/15/2023):  This is the current hospital active medication list Current Facility-Administered Medications  Medication Dose Route Frequency Provider Last Rate Last Admin   acetaminophen (TYLENOL) tablet 650 mg  650 mg Oral Q6H PRN Hillary Bow, DO   650 mg at 04/14/23 1603   Or   acetaminophen (TYLENOL) suppository 650 mg  650 mg Rectal Q6H PRN Hillary Bow, DO       amiodarone (PACERONE) tablet 200 mg  200 mg Oral Daily Lyda Perone M, DO   200 mg at 04/15/23 8119   apixaban (ELIQUIS) tablet 2.5 mg  2.5 mg Oral BID Hillary Bow, DO   2.5 mg at 04/15/23 1478   atorvastatin (LIPITOR) tablet 40 mg  40 mg Oral QHS Lyda Perone M, DO   40 mg at 04/14/23 2124   calcitRIOL (ROCALTROL) capsule 0.25 mcg  0.25 mcg Oral Daily Lyda Perone M, DO   0.25 mcg at 04/15/23 2956   cefTRIAXone (ROCEPHIN) 2 g in sodium chloride 0.9 % 100 mL IVPB  2 g Intravenous Q24H Miguel Rota, MD   Stopped at 04/14/23 2233   Chlorhexidine Gluconate Cloth 2 % PADS 6 each  6 each Topical Daily Miguel Rota, MD   6 each at 04/15/23 2130   cinacalcet (SENSIPAR) tablet 30 mg  30 mg Oral Q supper Delano Metz, MD   30 mg at 04/14/23 1718   digoxin (LANOXIN) tablet 0.0625 mg  0.0625 mg Oral QODAY Amin, Ankit C, MD   0.0625 mg at 04/15/23  0924   LORazepam (ATIVAN) injection 1 mg  1 mg Intravenous PRN Ernie Avena, MD       LORazepam (ATIVAN) tablet 0.5 mg  0.5 mg Oral PRN Ernie Avena, MD       metroNIDAZOLE (FLAGYL) tablet 500 mg  500 mg Oral Q12H Judyann Munson, MD   500 mg at 04/15/23 0924   midodrine (PROAMATINE) tablet 20 mg  20 mg Oral TID WC Lyda Perone M, DO   20 mg at 04/15/23 8657   oxyCODONE-acetaminophen (PERCOCET/ROXICET) 5-325 MG per tablet 1-2 tablet  1-2 tablet Oral Q6H PRN Pokhrel, Laxman, MD   1 tablet at 04/15/23 0924   pregabalin (LYRICA) capsule 75 mg  75 mg Oral BID Hillary Bow, DO   75 mg at 04/15/23 8469   sevelamer carbonate (RENVELA) tablet 800 mg  800 mg Oral TID WC Delano Metz, MD   800 mg at 04/15/23 6295     Discharge Medications: Please see discharge summary for a  list of discharge medications.  Relevant Imaging Results:  Relevant Lab Results:   Additional Information SSN: [id number]  HD [facility] on TTS 1:00 pm chair time  Takeo Harts A [place], [place]

## 2023-04-15 NOTE — Progress Notes (Signed)
[facility] Progress Note  Subjective: seen in room, L hip pain improving  Vitals:   04/15/23 0054 04/15/23 0517 04/15/23 0522 04/15/23 0735  BP: (!) 91/53  (!) 117/90 (!) 94/54  Pulse: 74  79 84  Resp: 17  17 18   Temp: 97.8 F (36.6 C)  98.5 F (36.9 C) 98 F (36.7 C)  TempSrc: Axillary  Oral   SpO2: 94%  96% 93%  Weight:  79.6 kg    Height:        Exam: Gen alert, no distress No rash, cyanosis or gangrene Sclera anicteric, throat clear  No jvd or bruits Chest clear bilat to bases, no rales/ wheezing RRR no MRG Abd soft ntnd no mass or ascites +bs GU normal male MS no joint effusions or deformity Ext no LE edema, no wounds or ulcers Neuro is alert, Ox 3 , nf    RIJ TDC intact     Renal-related home meds: - rocaltrol 0.25 mcg po every day - sensipar 30 every day - midodrine 20 tid - lyrica 75 bid - renvela 800 bid ac      [facility] HD: [facility]  3h  450/1.5    82kg  2/2.5 bath  TDC   Heparin none - last OP HD 9/27, post wt 82.3kg   CXR 9/30 - no active disease   Assessment/ Plan: GNR bacteremia / acute L hip osteomyelitis/ hip pain - on IV abx and ID consulting. Pt has TDC in place. Ordering f/u surveillance blood cx's today. So far ID has not recommended line holiday. On IV rocephin and po flagyl.  Afib / RVR - per pmd ESRD - on HD MWF. Had HD here Wed. Next HD Friday.  BP/ chronic hypotension - takes midodrine 20 tid at home, cont here. BP's low normal.  Volume - at dry wt, no edema per cXR, euvolemic on exam. Under dry wt.  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 Cont sensipar, po vdra and renvela as binder.  H/o cholecystitis - has chole tube changed out by [facility] regularly      Vinson Moselle MD  [facility] 04/15/2023, 2:56 PM  Recent Labs  Lab 04/13/23 0601 04/14/23 1151  HGB 9.3* 9.0*  ALBUMIN  --  2.1*  CALCIUM 8.8* 8.5*  PHOS  --  5.4*  CREATININE 8.94* 10.31*  K 4.0 3.6   No results for input(s):  "IRON", "TIBC", "FERRITIN" in the last 168 hours. Inpatient medications:  amiodarone  200 mg Oral Daily   apixaban  2.5 mg Oral BID   atorvastatin  40 mg Oral QHS   calcitRIOL  0.25 mcg Oral Daily   Chlorhexidine Gluconate Cloth  6 each Topical Daily   cinacalcet  30 mg Oral Q supper   digoxin  0.0625 mg Oral QODAY   metroNIDAZOLE  500 mg Oral Q12H   midodrine  20 mg Oral TID WC   pregabalin  75 mg Oral BID   sevelamer carbonate  800 mg Oral TID WC    cefTRIAXone (ROCEPHIN)  IV Stopped (04/14/23 2233)   acetaminophen **OR** acetaminophen, LORazepam, LORazepam, oxyCODONE-acetaminophen

## 2023-04-16 ENCOUNTER — Encounter (HOSPITAL_COMMUNITY): Payer: Self-pay | Admitting: Internal Medicine

## 2023-04-16 ENCOUNTER — Inpatient Hospital Stay (HOSPITAL_COMMUNITY): Payer: Medicare Other

## 2023-04-16 DIAGNOSIS — M25552 Pain in left hip: Secondary | ICD-10-CM | POA: Diagnosis not present

## 2023-04-16 DIAGNOSIS — I480 Paroxysmal atrial fibrillation: Secondary | ICD-10-CM | POA: Diagnosis not present

## 2023-04-16 DIAGNOSIS — N186 End stage renal disease: Secondary | ICD-10-CM | POA: Diagnosis not present

## 2023-04-16 DIAGNOSIS — R6510 Systemic inflammatory response syndrome (SIRS) of non-infectious origin without acute organ dysfunction: Secondary | ICD-10-CM | POA: Diagnosis not present

## 2023-04-16 HISTORY — PX: IR REMOVAL TUN CV CATH W/O FL: IMG2289

## 2023-04-16 LAB — CBC
HCT: 29.2 % — ABNORMAL LOW (ref 39.0–52.0)
Hemoglobin: 9.1 g/dL — ABNORMAL LOW (ref 13.0–17.0)
MCH: 29.4 pg (ref 26.0–34.0)
MCHC: 31.2 g/dL (ref 30.0–36.0)
MCV: 94.5 fL (ref 80.0–100.0)
Platelets: 215 10*3/uL (ref 150–400)
RBC: 3.09 MIL/uL — ABNORMAL LOW (ref 4.22–5.81)
RDW: 16.2 % — ABNORMAL HIGH (ref 11.5–15.5)
WBC: 6.3 10*3/uL (ref 4.0–10.5)
nRBC: 0 % (ref 0.0–0.2)

## 2023-04-16 LAB — BASIC METABOLIC PANEL
Anion gap: 14 (ref 5–15)
BUN: 35 mg/dL — ABNORMAL HIGH (ref 8–23)
CO2: 26 mmol/L (ref 22–32)
Calcium: 8.2 mg/dL — ABNORMAL LOW (ref 8.9–10.3)
Chloride: 94 mmol/L — ABNORMAL LOW (ref 98–111)
Creatinine, Ser: 7.68 mg/dL — ABNORMAL HIGH (ref 0.61–1.24)
GFR, Estimated: 7 mL/min — ABNORMAL LOW (ref >60)
Glucose, Bld: 96 mg/dL (ref 70–99)
Potassium: 3.4 mmol/L — ABNORMAL LOW (ref 3.5–5.1)
Sodium: 134 mmol/L — ABNORMAL LOW (ref 135–145)

## 2023-04-16 LAB — CULTURE, BLOOD (ROUTINE X 2)
Special Requests: ADEQUATE
Special Requests: ADEQUATE

## 2023-04-16 LAB — GLUCOSE, CAPILLARY: Glucose-Capillary: 88 mg/dL (ref 70–99)

## 2023-04-16 LAB — MAGNESIUM: Magnesium: 1.9 mg/dL (ref 1.7–2.4)

## 2023-04-16 MED ORDER — LIDOCAINE-PRILOCAINE 2.5-2.5 % EX CREA: 1.0000 | TOPICAL_CREAM | CUTANEOUS | Status: DC | PRN

## 2023-04-16 MED ORDER — ALTEPLASE 2 MG IJ SOLR: 2.0000 mg | Freq: Once | INTRAMUSCULAR | Status: DC | PRN

## 2023-04-16 MED ORDER — LIDOCAINE HCL (PF) 1 % IJ SOLN: 5.0000 mL | INTRAMUSCULAR | Status: DC | PRN

## 2023-04-16 MED ORDER — CEFTAZIDIME 2 G IV SOLR: 1.0000 g | INTRAVENOUS | Status: DC

## 2023-04-16 MED ORDER — LIDOCAINE HCL 1 % IJ SOLN
INTRAMUSCULAR | Status: AC
  Filled 2023-04-16: qty 20

## 2023-04-16 MED ORDER — NEPRO/CARBSTEADY PO LIQD: 237.0000 mL | ORAL | Status: DC | PRN

## 2023-04-16 MED ORDER — ANTICOAGULANT SODIUM CITRATE 4% (200MG/5ML) IV SOLN
5.0000 mL | Status: DC | PRN
  Filled 2023-04-16: qty 5

## 2023-04-16 MED ORDER — HEPARIN SODIUM (PORCINE) 1000 UNIT/ML DIALYSIS
1000.0000 [IU] | INTRAMUSCULAR | Status: DC | PRN
  Filled 2023-04-16: qty 1

## 2023-04-16 MED ORDER — SODIUM CHLORIDE 0.9 % IV SOLN
1.0000 g | INTRAVENOUS | Status: DC
  Filled 2023-04-16: qty 1

## 2023-04-16 MED ORDER — CEFTAZIDIME 2 G IV SOLR
2.0000 g | Freq: Once | INTRAVENOUS | Status: DC
  Filled 2023-04-16: qty 2

## 2023-04-16 MED ORDER — PENTAFLUOROPROP-TETRAFLUOROETH EX AERO: 1.0000 | INHALATION_SPRAY | CUTANEOUS | Status: DC | PRN

## 2023-04-16 MED ORDER — SODIUM CHLORIDE 0.9 % IV SOLN: 2.0000 g | Freq: Once | INTRAVENOUS | Status: DC
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
## 2023-04-16 NOTE — Consult Note (Addendum)
Chief Complaint: Patient was seen in consultation today for perm cath placement Chief Complaint  Patient presents with   Leg Pain   at the request of Rogers Blocker PA-C  Referring Physician(s): Rogers Blocker PA-C  Supervising Physician: Oley Balm  Patient Status: [facility] - In-pt  History of Present Illness: Justin [name] is a 77 y.o. 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   Patient is known to IR service for perc chole exchange on 9/3 and left ischial tuberosity abscess aspiration on 03/30/23.    Patient presented to ED on 9/30 due to left leg pain, he was found to be septic and admitted. 2 blood cx obtained on 9/30 both positive, ID was consulted who recommended line holiday, IR was requested for perm cath removal which occurred on 10/4. Patient will need long term HD access after the line holiday, IR was requested for image guided tunneled hemodialysis catheter placement.   Patient seen in IR when he was brought down for perm cath removal.  Only complaint is bilateral leg pain, states that due to his sciatica.    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) 12/07/2019   Dialysis patient [facility])    ESRD (end stage renal disease) on dialysis (HCC)    Gout    Hypertension    Hypotension 06/13/2022   Right posterior capsular opacification 02/27/2021   Septic shock (HCC)     Vitreous hemorrhage of right eye (HCC) 10/24/2019    Past Surgical History:  Procedure Laterality Date   A/V FISTULAGRAM Left 08/17/2017   Procedure: A/V FISTULAGRAM;  Surgeon: Renford Dills, MD;  Location: [facility] INVASIVE CV LAB;  Service: Cardiovascular;  Laterality: Left;   A/V SHUNT INTERVENTION N/A 08/17/2017   Procedure: A/V SHUNT INTERVENTION;  Surgeon: Renford Dills, MD;  Location: [facility] INVASIVE CV LAB;  Service: Cardiovascular;  Laterality: N/A;   AV FISTULA PLACEMENT Left 05/31/2015   Procedure: ARTERIOVENOUS (AV) FISTULA CREATION;  Surgeon: Renford Dills, MD;  Location: [facility] ORS;  Service: Vascular;  Laterality: Left;   COLONOSCOPY N/A 12/17/2022   Procedure: COLONOSCOPY;  Surgeon: Napoleon Form, MD;  Location: [facility] ENDOSCOPY;  Service: Gastroenterology;  Laterality: N/A;   COLONOSCOPY WITH PROPOFOL N/A 09/11/2022   Procedure: COLONOSCOPY WITH PROPOFOL;  Surgeon: Imogene Burn, MD;  Location: [facility] ENDOSCOPY;  Service: Gastroenterology;  Laterality: N/A;   COLONOSCOPY WITH PROPOFOL N/A 09/17/2022   Procedure: COLONOSCOPY WITH PROPOFOL;  Surgeon: Benancio Deeds, MD;  Location: [facility] ENDOSCOPY;  Service: Gastroenterology;  Laterality: N/A;   ESOPHAGOGASTRODUODENOSCOPY (EGD) WITH PROPOFOL N/A 09/11/2022   Procedure: ESOPHAGOGASTRODUODENOSCOPY (EGD) WITH PROPOFOL;  Surgeon: Imogene Burn, MD;  Location: [facility] ENDOSCOPY;  Service: Gastroenterology;  Laterality: N/A;   ESOPHAGOGASTRODUODENOSCOPY (EGD) WITH PROPOFOL N/A 12/17/2022   Procedure: ESOPHAGOGASTRODUODENOSCOPY (EGD) WITH PROPOFOL;  Surgeon: Napoleon Form, MD;  Location: [facility] ENDOSCOPY;  Service: Gastroenterology;  Laterality: N/A;   excision  bone spurs Bilateral 1989   feet   GIVENS CAPSULE STUDY N/A 11/10/2022   Procedure: GIVENS CAPSULE STUDY;  Surgeon: Shellia Cleverly, DO;  Location: [facility] ENDOSCOPY;  Service: Gastroenterology;  Laterality: N/A;   HEMOSTASIS CLIP PLACEMENT  09/11/2022   Procedure: HEMOSTASIS CLIP  PLACEMENT;  Surgeon: Imogene Burn, MD;  Location: [facility] ENDOSCOPY;  Service: Gastroenterology;;   HEMOSTASIS CLIP PLACEMENT  09/17/2022   Procedure: HEMOSTASIS CLIP PLACEMENT;  Surgeon: Benancio Deeds, MD;  Location: [facility] ENDOSCOPY;  Service: Gastroenterology;;   HEMOSTASIS CLIP PLACEMENT  12/17/2022   Procedure: HEMOSTASIS CLIP PLACEMENT;  Surgeon: Napoleon Form, MD;  Location: [facility] ENDOSCOPY;  Service: Gastroenterology;;   HOT HEMOSTASIS N/A 09/11/2022   Procedure: HOT HEMOSTASIS (ARGON PLASMA COAGULATION/BICAP);  Surgeon: Imogene Burn, MD;  Location: [facility] ENDOSCOPY;  Service: Gastroenterology;  Laterality: N/A;   HOT HEMOSTASIS N/A 09/17/2022   Procedure: HOT HEMOSTASIS (ARGON PLASMA COAGULATION/BICAP);  Surgeon: Benancio Deeds, MD;  Location: [facility] ENDOSCOPY;  Service: Gastroenterology;  Laterality: N/A;   INCISION AND DRAINAGE PERIRECTAL ABSCESS N/A 06/15/2022   Procedure: IRRIGATION AND DEBRIDEMENT PERIRECTAL ABSCESS;  Surgeon: Franky Macho, MD;  Location: [facility] ORS;  Service: General;  Laterality: N/A;   IR EXCHANGE BILIARY DRAIN  03/16/2023   KNEE SURGERY Left 1998   arthroscopy   POLYPECTOMY  09/11/2022   Procedure: POLYPECTOMY;  Surgeon: Imogene Burn, MD;  Location: [facility] ENDOSCOPY;  Service: Gastroenterology;;   SHOULDER SURGERY Left 1994   rotator cuff    Allergies: Patient has no known allergies.  Medications: Prior to Admission medications   Medication Sig Start Date End Date Taking? Authorizing Provider  acetaminophen (TYLENOL) 325 MG tablet Take 2 tablets (650 mg total) by mouth every 6 (six) hours as needed for mild pain (or Fever >/= 101). 11/24/22  Yes Pokhrel, Laxman, MD  amiodarone (PACERONE) 200 MG tablet Take 1 tablet (200 mg total) by mouth daily. 04/09/23  Yes Burnadette Pop, MD  apixaban (ELIQUIS) 2.5 MG TABS tablet Take 1 tablet (2.5 mg total) by mouth 2 (two) times daily. 04/08/23  Yes Burnadette Pop, MD  atorvastatin (LIPITOR) 40 MG tablet Take 40 mg by mouth at  bedtime. 10/30/14  Yes [provider]  calcitRIOL (ROCALTROL) 0.25 MCG capsule Take 3 capsules (0.75 mcg total) by mouth every Monday, Wednesday, and Friday with hemodialysis. Patient taking differently: Take 0.25 mcg by mouth daily. 11/25/22  Yes Pokhrel, Laxman, MD  Digoxin 62.5 MCG TABS Take 1 tablet by mouth every other day. 04/10/23  Yes [name], MD  midodrine (PROAMATINE) 10 MG tablet Take 2 tablets (20 mg total) by mouth 3 (three) times daily. 04/08/23  Yes Burnadette Pop, MD  pantoprazole (PROTONIX) 40 MG tablet Take 1 tablet (40 mg total) by mouth 2 (two) times daily before a meal. Patient taking differently: Take 40 mg by mouth daily. 11/24/22  Yes Pokhrel, Laxman, MD  pregabalin (LYRICA) 75 MG capsule Take 1 capsule (75 mg total) by mouth at bedtime. Patient taking differently: Take 75 mg by mouth 2 (two) times daily. 11/24/22 11/24/23 Yes Pokhrel, Laxman, MD  cinacalcet (SENSIPAR) 30 MG tablet Take 30 mg by mouth daily. Patient not taking: Reported on 04/13/2023 08/25/22   [provider]  sevelamer carbonate (RENVELA) 800 MG tablet Take 800 mg by mouth 2 (two) times daily. Patient not taking: Reported on 03/29/2023    [provider]     Family History  Problem Relation Age of Onset   Kidney disease Mother  Alcoholism Father    Alcoholism Brother    Colon cancer Neg Hx    Stomach cancer Neg Hx    Esophageal cancer Neg Hx     Social History   Socioeconomic History   Marital status: Widowed    Spouse name: Not on file   Number of children: Not on file   Years of education: Not on file   Highest education level: Not on file  Occupational History   Not on file  Tobacco Use   Smoking status: Former    Current packs/day: 0.00    Types: Cigarettes    Quit date: 10/01/1978    Years since quitting: 44.5   Smokeless tobacco: Never  Vaping Use   Vaping status: Never Used  Substance and Sexual Activity   Alcohol use: No   Drug use: No    Sexual activity: Not on file  Other Topics Concern   Not on file  Social History Narrative   Not on file   Social Determinants of Health   Financial Resource Strain: Not on file  Food Insecurity: Food Insecurity Present (04/13/2023)   Hunger Vital Sign    Worried About Running Out of Food in the Last Year: Sometimes true    Ran Out of Food in the Last Year: Never true  Transportation Needs: Unmet Transportation Needs (04/13/2023)   PRAPARE - Administrator, Civil Service (Medical): Yes    Lack of Transportation (Non-Medical): Yes  Physical Activity: Not on file  Stress: No Stress Concern Present (10/14/2022)   Received from [facility], [facility]   [employer] [facility] - Occupational Stress Questionnaire    Feeling of Stress : Not at all  Social Connections: Unknown (10/13/2022)   Received from [facility], [facility]   Social Network    Social Network: Not on file     Review of Systems: A 12 point ROS discussed and pertinent positives are indicated in the HPI above.  All other systems are negative.  Vital Signs: BP 125/61   Pulse (!) 56   Temp 97.7 F (36.5 C)   Resp 13   Ht 5\' 10"  (1.778 m)   Wt 175 lb 7.8 oz (79.6 kg)   SpO2 95%   BMI 25.18 kg/m    Physical Exam Vitals reviewed.  Constitutional:      General: He is not in acute distress. HENT:     Head: Normocephalic and atraumatic.     Mouth/Throat:     Mouth: Mucous membranes are moist.     Pharynx: Oropharynx is clear.  Cardiovascular:     Rate and Rhythm: Normal rate and regular rhythm.     Heart sounds: Murmur heard.  Pulmonary:     Effort: Pulmonary effort is normal.     Breath sounds: Normal breath sounds.  Abdominal:     General: Abdomen is flat. Bowel sounds are normal.     Palpations: Abdomen is soft.     Comments: + RUQ perc chole drain. Simple bile in the collecting bag.   Musculoskeletal:     Cervical back: Neck supple.  Skin:    General: Skin is  warm and dry.     Coloration: Skin is not jaundiced or pale.  Neurological:     Mental Status: He is alert and oriented to person, place, and time.  Psychiatric:        Mood and Affect: Mood normal.        Behavior: Behavior normal.  Judgment: Judgment normal.     MD Evaluation Airway: WNL Heart: WNL Abdomen: WNL Chest/ Lungs: WNL ASA  Classification: 3 Mallampati/Airway Score: Two  Imaging: CT HIP LEFT WO CONTRAST  Result Date: 04/13/2023 CLINICAL DATA:  Septic arthritis suspected in the left hip. EXAM: CT OF THE LEFT HIP WITHOUT CONTRAST TECHNIQUE: Multidetector CT imaging of the left hip was performed according to the standard protocol. Multiplanar CT image reconstructions were also generated. RADIATION DOSE REDUCTION: This exam was performed according to the departmental dose-optimization program which includes automated exposure control, adjustment of the mA and/or kV according to patient size and/or use of iterative reconstruction technique. COMPARISON:  CT abdomen and pelvis and reconstructions recently 03/29/2023 FINDINGS: Bones/Joint/Cartilage Somewhat dense bones are again noted consistent with renal osteodystrophy. There is moderate superior joint space loss at the left hip with degenerative subcortical cystic changes of the superior acetabulum and small acetabular and femoral head osteophytes. No left hip joint effusion or joint surface destruction is seen. Narrowing and trace spurring of the pubic symphysis is again noted. 3.3 cm hypodense collection again underlies the left ischial tuberosity was previously much better delineated and most likely represents an abscess. There is cortical indistinctness overlying this collection suspicious for osteomyelitis. Ligaments Suboptimally assessed by CT. Muscles and Tendons There is mild atrophy of the gluteal and upper thigh musculature, mild volume loss in the floor of pelvis muscles. No acute tendon abnormality is seen. No  intramuscular fluid collections. Soft tissues There is heavy iliofemoral calcific arterial plaque. Minimal ascites in the posterior deep pelvis again is noted. Enlarged prostate with fiducial markers. Bladder is nondistended but appears thickened which could be due to XRT cystitis or bacterial cystitis. There are small bilateral inguinal fat hernias. IMPRESSION: 1. 3.3 cm hypodense collection again underlies the left ischial tuberosity, most likely an abscess and was seen previously. There is cortical indistinctness overlying this collection suspicious for osteomyelitis. 2. Moderate left hip osteoarthritis. No left hip joint effusion or joint surface destruction. 3. Renal osteodystrophy. 4. Calcific arteriosclerosis. 5. Minimal ascites in the posterior deep pelvis. 6. Chronic enlarged prostate with fiducial markers. 7. Bladder wall thickening which could be due to nondistention, XRT or bacterial cystitis. 8. Small bilateral inguinal fat hernias. Electronically Signed   By: Almira Bar M.D.   On: 04/13/2023 04:52   MR LUMBAR SPINE WO CONTRAST  Result Date: 04/12/2023 CLINICAL DATA:  Acute myelopathy EXAM: MRI THORACIC AND LUMBAR SPINE WITHOUT CONTRAST TECHNIQUE: Multiplanar and multiecho pulse sequences of the thoracic and lumbar spine were obtained without intravenous contrast. COMPARISON:  None Available. FINDINGS: MRI THORACIC SPINE FINDINGS Alignment:  Physiologic. Vertebrae: The heterogeneous bone marrow signal without discrete lesion. There are Modic type 2 endplate signal changes at T8-9, T10-11 and T11-12. No acute fracture. Cord:  Normal signal and morphology. Paraspinal and other soft tissues: Small pleural effusions Disc levels: There is no spinal canal stenosis or neural impingement. No thoracic disc herniation. MRI LUMBAR SPINE FINDINGS Segmentation:  Standard. Alignment:  Grade 1 anterolisthesis at L3-4 Vertebrae: No fracture, evidence of discitis, or bone lesion. There are type 2 Modic signal  changes at all levels. Schmorl's nodes are most prominent at L1 L2 and L3. There is mild degenerative height loss at L4. Conus medullaris and cauda equina: Conus extends to the L1 level. Conus and cauda equina appear normal. Paraspinal and other soft tissues: Negative. Disc levels: L1-L2: Normal disc space and facet joints. No spinal canal stenosis. No neural foraminal stenosis. L2-L3: Small disc bulge  and mild facet hypertrophy. No spinal canal stenosis. No neural foraminal stenosis. L3-L4: Large disc bulge and moderate facet hypertrophy. Moderate spinal canal stenosis. Severe bilateral neural foraminal stenosis. L4-L5: Intermediate sized disc bulge and mild facet hypertrophy. No spinal canal stenosis. Mild right neural foraminal stenosis. L5-S1: Intermediate sized disc bulge with large right extraforaminal osteophyte. No spinal canal stenosis. No neural foraminal stenosis. Visualized sacrum: Normal. IMPRESSION: 1. No acute abnormality of the thoracic or lumbar spine. 2. Moderate spinal canal stenosis and severe bilateral neural foraminal stenosis at L3-L4 due to combination of disc bulge and facet arthrosis. 3. Mild right L4-5 neural foraminal stenosis. 4. Small pleural effusions. 5. Widespread degenerative disc disease with multilevel type 2 Modic signal change. Electronically Signed   By: Deatra Robinson M.D.   On: 04/12/2023 23:51   MR THORACIC SPINE WO CONTRAST  Result Date: 04/12/2023 CLINICAL DATA:  Acute myelopathy EXAM: MRI THORACIC AND LUMBAR SPINE WITHOUT CONTRAST TECHNIQUE: Multiplanar and multiecho pulse sequences of the thoracic and lumbar spine were obtained without intravenous contrast. COMPARISON:  None Available. FINDINGS: MRI THORACIC SPINE FINDINGS Alignment:  Physiologic. Vertebrae: The heterogeneous bone marrow signal without discrete lesion. There are Modic type 2 endplate signal changes at T8-9, T10-11 and T11-12. No acute fracture. Cord:  Normal signal and morphology. Paraspinal and other  soft tissues: Small pleural effusions Disc levels: There is no spinal canal stenosis or neural impingement. No thoracic disc herniation. MRI LUMBAR SPINE FINDINGS Segmentation:  Standard. Alignment:  Grade 1 anterolisthesis at L3-4 Vertebrae: No fracture, evidence of discitis, or bone lesion. There are type 2 Modic signal changes at all levels. Schmorl's nodes are most prominent at L1 L2 and L3. There is mild degenerative height loss at L4. Conus medullaris and cauda equina: Conus extends to the L1 level. Conus and cauda equina appear normal. Paraspinal and other soft tissues: Negative. Disc levels: L1-L2: Normal disc space and facet joints. No spinal canal stenosis. No neural foraminal stenosis. L2-L3: Small disc bulge and mild facet hypertrophy. No spinal canal stenosis. No neural foraminal stenosis. L3-L4: Large disc bulge and moderate facet hypertrophy. Moderate spinal canal stenosis. Severe bilateral neural foraminal stenosis. L4-L5: Intermediate sized disc bulge and mild facet hypertrophy. No spinal canal stenosis. Mild right neural foraminal stenosis. L5-S1: Intermediate sized disc bulge with large right extraforaminal osteophyte. No spinal canal stenosis. No neural foraminal stenosis. Visualized sacrum: Normal. IMPRESSION: 1. No acute abnormality of the thoracic or lumbar spine. 2. Moderate spinal canal stenosis and severe bilateral neural foraminal stenosis at L3-L4 due to combination of disc bulge and facet arthrosis. 3. Mild right L4-5 neural foraminal stenosis. 4. Small pleural effusions. 5. Widespread degenerative disc disease with multilevel type 2 Modic signal change. Electronically Signed   By: Deatra Robinson M.D.   On: 04/12/2023 23:51   CT HEAD WO CONTRAST ( )  Result Date: 04/12/2023 CLINICAL DATA:  Leg pain and weakness, initial encounter EXAM: CT HEAD WITHOUT CONTRAST TECHNIQUE: Contiguous axial images were obtained from the base of the skull through the vertex without intravenous contrast.  RADIATION DOSE REDUCTION: This exam was performed according to the departmental dose-optimization program which includes automated exposure control, adjustment of the mA and/or kV according to patient size and/or use of iterative reconstruction technique. COMPARISON:  11/09/2022 FINDINGS: Brain: No evidence of acute infarction, hemorrhage, hydrocephalus, extra-axial collection or mass lesion/mass effect. Vascular: No hyperdense vessel or unexpected calcification. Skull: Normal. Negative for fracture or focal lesion. Sinuses/Orbits: No acute finding. Other: None. IMPRESSION: No acute abnormality  noted. Electronically Signed   By: Alcide Clever M.D.   On: 04/12/2023 23:38   [name] CONTRAST  Result Date: 04/12/2023 CLINICAL DATA:  Acute neurologic deficit.  Left leg weakness. EXAM: MRI HEAD WITHOUT CONTRAST TECHNIQUE: Multiplanar, multiecho pulse sequences of the brain and surrounding structures were obtained without intravenous contrast. COMPARISON:  07/10/2011 FINDINGS: Brain: No acute infarct. No acute hemorrhage or extra-axial collection. Small amount of chronic blood products at the anterior right convexity. There is multifocal hyperintense T2-weighted signal within the periventricular and deep white matter. Mild volume loss. No hydrocephalus. The major midline structures are normal. Vascular: Normal flow voids. Skull and upper cervical spine: Normal marrow signal. Sinuses/Orbits: Negative. Other: None. IMPRESSION: 1. No acute intracranial abnormality. 2. Findings of chronic microvascular ischemia. Electronically Signed   By: Deatra Robinson M.D.   On: 04/12/2023 23:23   DG Chest Portable 1 View  Result Date: 04/12/2023 CLINICAL DATA:  Pacemaker EXAM: PORTABLE CHEST 1 VIEW COMPARISON:  03/29/2023. FINDINGS: Unremarkable cardiac silhouette. Right-sided hemodialysis catheter tip at the RA/SVC junction. Alveolar opacity left base consistent with an early pneumonia or volume loss. Calcified aorta. No  pneumothorax. No pleural effusion. IMPRESSION: Early/mild left base consolidation or volume loss. No pacemaker is seen. Electronically Signed   By: Layla Maw M.D.   On: 04/12/2023 19:40   CT GUIDED NEEDLE PLACEMENT  Result Date: 03/30/2023 INDICATION: [id number] Abscess [id number] EXAM: CT-GUIDED ASPIRATION OF LEFT ISCHIAL ABSCESS COMPARISON:  CT [facility], 03/29/2023. MEDICATIONS: The patient is currently admitted to the hospital and receiving intravenous antibiotics. The antibiotics were administered within an appropriate time frame prior to the initiation of the procedure. ANESTHESIA/SEDATION: Local anesthetic was administered. The patient was continuously monitored during the procedure by the interventional radiology nurse under my direct supervision. CONTRAST:  None COMPLICATIONS: None immediate. PROCEDURE: RADIATION DOSE REDUCTION: This exam was performed according to the departmental dose-optimization program which includes automated exposure control, adjustment of the mA and/or kV according to patient size and/or use of iterative reconstruction technique. Informed written consent was obtained from the patient and/or patient's representative after a discussion of the risks, benefits and alternatives to treatment. The patient was placed prone on the CT gantry and a pre procedural CT was performed re-demonstrating the known abscess/fluid collection within the LEFT ischial soft tissues. The procedure was planned. A timeout was performed prior to the initiation of the procedure. The LEFT gluteus was prepped and draped in the usual sterile fashion. The overlying soft tissues were anesthetized with 1% lidocaine with epinephrine. Appropriate trajectory was planned with the use of a 22 gauge spinal needle. An 18 gauge trocar needle was advanced into the abscess/fluid collection and attempted aspiration was performed. No significant residual fluid was obtained, therefore lavaged with 5 mL sterile saline solution was  performed. 3 mL of serosanguineous fluid was aspirated. The needle was removed and dressing was placed. The patient tolerated the procedure well without immediate post procedural complication. IMPRESSION: Successful CT guided aspiration of a small LEFT ischial abscess, as described above. Samples were sent to the laboratory as requested by the ordering clinical team. Roanna Banning, MD [facility] Electronically Signed   By: Roanna Banning M.D.   On: 03/30/2023 18:24   VAS [place] LOWER EXTREMITY VENOUS (DVT) (ONLY [facility] & [facility])  Result Date: 03/29/2023  Lower Venous DVT Study Patient Name:  JAKYRIN BEMAN  Date of Exam:   03/29/2023 Medical Rec #: [id number]  Accession #:    [id number] Date of Birth: [birth year]       Patient Gender: M Patient Age:   20 years Exam Location:  [facility] Procedure:      VAS [place] LOWER EXTREMITY VENOUS (DVT) Referring Phys: Ernie Avena --------------------------------------------------------------------------------  Indications: Edema, and Hypotension and headache today prior to receiving dialysis.  Limitations: Patient somnolence, edema of calves, unable to remove pants, and inability to keep leg on bed. Comparison Study: No prior study on file Performing Technologist: Sherren Kerns RVS  Examination Guidelines: A complete evaluation includes B-mode imaging, spectral Doppler, color Doppler, and power Doppler as needed of all accessible portions of each vessel. Bilateral testing is considered an integral part of a complete examination. Limited examinations for reoccurring indications may be performed as noted. The reflux portion of the exam is performed with the patient in reverse Trendelenburg.  +---------+---------------+---------+-----------+---------------+--------------+ RIGHT    CompressibilityPhasicitySpontaneityProperties     Thrombus Aging  +---------+---------------+---------+-----------+---------------+--------------+ CFV      Full                               pulsatile                                                                 waveforms                     +---------+---------------+---------+-----------+---------------+--------------+ SFJ      Full                                                             +---------+---------------+---------+-----------+---------------+--------------+ FV Prox                                     pulsatile      patent by                                                  waveforms      color and                                                                 Doppler        +---------+---------------+---------+-----------+---------------+--------------+ FV Mid   Full                               pulsatile      patent by  waveforms      color and                                                                 Doppler        +---------+---------------+---------+-----------+---------------+--------------+ FV Distal                                   pulsatile      patent by                                                  waveforms      color and                                                                 Doppler        +---------+---------------+---------+-----------+---------------+--------------+ PFV                                         pulsatile      patent by                                                  waveforms      color and                                                                 Doppler        +---------+---------------+---------+-----------+---------------+--------------+ POP                                         pulsatile      patent by                                                  waveforms      color and  Doppler        +---------+---------------+---------+-----------+---------------+--------------+ PTV                                                        Not well                                                                  visualized     +---------+---------------+---------+-----------+---------------+--------------+ PERO                                                       Not well                                                                  visualized     +---------+---------------+---------+-----------+---------------+--------------+   +---------+---------------+---------+-----------+---------------+--------------+ LEFT     CompressibilityPhasicitySpontaneityProperties     Thrombus Aging +---------+---------------+---------+-----------+---------------+--------------+ CFV      Full                               pulsatile                                                                 waveforms                     +---------+---------------+---------+-----------+---------------+--------------+ SFJ      Full                                                             +---------+---------------+---------+-----------+---------------+--------------+ FV Prox  Full                               pulsatile                                                                 waveforms                     +---------+---------------+---------+-----------+---------------+--------------+ FV Mid   Full  pulsatile                                                                 waveforms                     +---------+---------------+---------+-----------+---------------+--------------+ FV Distal                                   pulsatile      patent by                                                  waveforms      color and                                                                  Doppler        +---------+---------------+---------+-----------+---------------+--------------+ PFV      Full                               pulsatile                                                                 waveforms                     +---------+---------------+---------+-----------+---------------+--------------+ POP                                         pulsatile      patent by                                                  waveforms      color and                                                                 Doppler        +---------+---------------+---------+-----------+---------------+--------------+ PTV  Not well                                                                  visualized     +---------+---------------+---------+-----------+---------------+--------------+ PERO                                                       Not well                                                                  visualized     +---------+---------------+---------+-----------+---------------+--------------+ Gastroc                                     pulsatile      patent by                                                  waveforms      color and                                                                 Doppler        +---------+---------------+---------+-----------+---------------+--------------+     Summary: RIGHT: - There is no evidence of deep vein thrombosis in the lower extremity. However, portions of this examination were limited- see technologist comments above.  pulsatile waveforms consistent with fluid overload  LEFT: - There is no evidence of deep vein thrombosis in the lower extremity. However, portions of this examination were limited- see technologist comments above.  Pulsatile waveforms  consistent with fluid overload.  *See table(s) above for measurements and observations. Electronically signed by Lemar Livings MD on 03/29/2023 at 4:43:22 PM.    Final    CT Angio Chest PE W and/or Wo Contrast  Result Date: 03/29/2023 CLINICAL DATA:  Hypotension during dialysis, headache, sepsis EXAM: CT ANGIOGRAPHY CHEST CT ABDOMEN AND PELVIS WITH CONTRAST TECHNIQUE: Multidetector CT imaging of the chest was performed using the standard protocol during bolus administration of intravenous contrast. Multiplanar CT image reconstructions and MIPs were obtained to evaluate the vascular anatomy. Multidetector CT imaging of the abdomen and pelvis was performed using the standard protocol during bolus administration of intravenous contrast. RADIATION DOSE REDUCTION: This exam was performed according to the departmental dose-optimization program which includes automated exposure control, adjustment of the mA and/or kV according to patient size and/or use of iterative reconstruction technique. CONTRAST:  75mL OMNIPAQUE IOHEXOL 350 MG/ML SOLN COMPARISON:  03/29/2023,  12/14/2022 FINDINGS: CTA CHEST FINDINGS Cardiovascular: This is a technically adequate evaluation of the pulmonary vasculature. No filling defects or pulmonary emboli. The heart is unremarkable without pericardial effusion. No evidence of thoracic aortic aneurysm or dissection. Atherosclerosis of the aorta and coronary vasculature. Mediastinum/Nodes: No enlarged mediastinal, hilar, or axillary lymph nodes. Thyroid gland, trachea, and esophagus demonstrate no significant findings. Lungs/Pleura: Mild upper lobe predominant emphysema. Bibasilar hypoventilatory changes. No acute airspace disease, effusion, or pneumothorax. Central airways are patent. 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 No acute or destructive bony abnormalities. Reconstructed images demonstrate no additional findings. Review of the MIP images confirms the  above findings. CT ABDOMEN and PELVIS FINDINGS Hepatobiliary: Percutaneous cholecystostomy tube again coiled within the gallbladder lumen. Calcified gallstones with mild gallbladder wall thickening unchanged. No pericholecystic fat stranding or free fluid. Liver is unremarkable. Mild stable intrahepatic biliary duct dilation. Pancreas: Unremarkable. No pancreatic ductal dilatation or surrounding inflammatory changes. Spleen: Normal in size without focal abnormality. Adrenals/Urinary Tract: Chronic renal atrophy consistent with history of end-stage renal disease. No acute renal findings. Bladder is decompressed, limiting its evaluation. The adrenals are stable. Stomach/Bowel: No bowel obstruction or ileus. Normal appendix right lower quadrant. Moderate retained stool, most pronounced in the rectal wall, which could reflect an element of fecal impaction. There is mild rectal wall thickening and presacral fat stranding which could reflect an element of stercoral colitis. The mass lesion seen within the gastric cardia on prior study is difficult to appreciate on this exam due to under distension, though is likely seen on image 19/3 measuring up to 3.9 cm. Vascular/Lymphatic: Aortic atherosclerosis. No enlarged abdominal or pelvic lymph nodes. Reproductive: Stable enlargement of the prostate with fiduciary markers again noted. Other: No free fluid or free intraperitoneal gas. No abdominal wall hernia. Mild diffuse body wall edema. Musculoskeletal: Stable findings of renal osteodystrophy. There is a new rim enhancing fluid collection along the inferior margin of the left ischium, measuring up to 3.3 x 3.1 x 3.5 cm reference image 97/3. No evidence of underlying bony abnormality. No acute fractures. Reconstructed images demonstrate no additional findings. Review of the MIP images confirms the above findings. IMPRESSION: Chest: 1. No evidence of pulmonary embolus. 2. Bibasilar hypoventilatory changes.  No acute airspace  disease. 3. Aortic Atherosclerosis (ICD10-I70.0) and Emphysema (ICD10-J43.9). Abdomen/pelvis: 1. New 3.5 cm rim enhancing fluid collection within the soft tissues adjacent to the inferior margin of the left ischium, suspicious for soft tissue abscess. No evidence of underlying osteomyelitis or signs of overlying soft tissue ulceration. 2. Moderate retained stool within the rectal vault, consistent with fecal impaction. Mild rectal wall thickening and perirectal fat stranding could reflect superimposed stercoral colitis. 3. Stable indwelling cholecystostomy tube, with decompression of the gallbladder. Stable calcified gallstones noted. 4. Findings consistent with known end-stage renal disease, with diffuse renal osteodystrophy again noted. 5.  Aortic Atherosclerosis (ICD10-I70.0). 6. 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 Electronically Signed   By: Sharlet Salina M.D.   On: 03/29/2023 15:38   CT ABDOMEN PELVIS W CONTRAST  Result Date: 03/29/2023 CLINICAL DATA:  Hypotension during dialysis, headache, sepsis EXAM: CT ANGIOGRAPHY CHEST CT ABDOMEN AND PELVIS WITH CONTRAST TECHNIQUE: Multidetector CT imaging of the chest was performed using the standard protocol during bolus administration of intravenous contrast. Multiplanar CT image reconstructions and MIPs were obtained to  evaluate the vascular anatomy. Multidetector CT imaging of the abdomen and pelvis was performed using the standard protocol during bolus administration of intravenous contrast. RADIATION DOSE REDUCTION: This exam was performed according to the departmental dose-optimization program which includes automated exposure control, adjustment of the mA and/or kV according to patient size and/or use of iterative reconstruction technique. CONTRAST:  75mL OMNIPAQUE  IOHEXOL 350 MG/ML SOLN COMPARISON:  03/29/2023, 12/14/2022 FINDINGS: CTA CHEST FINDINGS Cardiovascular: This is a technically adequate evaluation of the pulmonary vasculature. No filling defects or pulmonary emboli. The heart is unremarkable without pericardial effusion. No evidence of thoracic aortic aneurysm or dissection. Atherosclerosis of the aorta and coronary vasculature. Mediastinum/Nodes: No enlarged mediastinal, hilar, or axillary lymph nodes. Thyroid gland, trachea, and esophagus demonstrate no significant findings. Lungs/Pleura: Mild upper lobe predominant emphysema. Bibasilar hypoventilatory changes. No acute airspace disease, effusion, or pneumothorax. Central airways are patent. 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 No acute or destructive bony abnormalities. Reconstructed images demonstrate no additional findings. Review of the MIP images confirms the above findings. CT ABDOMEN and PELVIS FINDINGS Hepatobiliary: Percutaneous cholecystostomy tube again coiled within the gallbladder lumen. Calcified gallstones with mild gallbladder wall thickening unchanged. No pericholecystic fat stranding or free fluid. Liver is unremarkable. Mild stable intrahepatic biliary duct dilation. Pancreas: Unremarkable. No pancreatic ductal dilatation or surrounding inflammatory changes. Spleen: Normal in size without focal abnormality. Adrenals/Urinary Tract: Chronic renal atrophy consistent with history of end-stage renal disease. No acute renal findings. Bladder is decompressed, limiting its evaluation. The adrenals are stable. Stomach/Bowel: No bowel obstruction or ileus. Normal appendix right lower quadrant. Moderate retained stool, most pronounced in the rectal wall, which could reflect an element of fecal impaction. There is mild rectal wall thickening and presacral fat stranding which could reflect an element of stercoral colitis. The mass lesion seen within the gastric cardia  on prior study is difficult to appreciate on this exam due to under distension, though is likely seen on image 19/3 measuring up to 3.9 cm. Vascular/Lymphatic: Aortic atherosclerosis. No enlarged abdominal or pelvic lymph nodes. Reproductive: Stable enlargement of the prostate with fiduciary markers again noted. Other: No free fluid or free intraperitoneal gas. No abdominal wall hernia. Mild diffuse body wall edema. Musculoskeletal: Stable findings of renal osteodystrophy. There is a new rim enhancing fluid collection along the inferior margin of the left ischium, measuring up to 3.3 x 3.1 x 3.5 cm reference image 97/3. No evidence of underlying bony abnormality. No acute fractures. Reconstructed images demonstrate no additional findings. Review of the MIP images confirms the above findings. IMPRESSION: Chest: 1. No evidence of pulmonary embolus. 2. Bibasilar hypoventilatory changes.  No acute airspace disease. 3. Aortic Atherosclerosis (ICD10-I70.0) and Emphysema (ICD10-J43.9). Abdomen/pelvis: 1. New 3.5 cm rim enhancing fluid collection within the soft tissues adjacent to the inferior margin of the left ischium, suspicious for soft tissue abscess. No evidence of underlying osteomyelitis or signs of overlying soft tissue ulceration. 2. Moderate retained stool within the rectal vault, consistent with fecal impaction. Mild rectal wall thickening and perirectal fat stranding could reflect superimposed stercoral colitis. 3. Stable indwelling cholecystostomy tube, with decompression of the gallbladder. Stable calcified gallstones noted. 4. Findings consistent with known end-stage renal disease, with diffuse renal osteodystrophy again noted. 5.  Aortic Atherosclerosis (ICD10-I70.0). 6. 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  Electronically  Signed   By: Sharlet Salina M.D.   On: 03/29/2023 15:38   DG Chest Port 1 View  Result Date: 03/29/2023 CLINICAL DATA:  Hypertension.  Headache. EXAM: PORTABLE CHEST 1 VIEW COMPARISON:  November 08, 2022. FINDINGS: Stable cardiomediastinal silhouette. Right internal jugular dialysis catheter is in grossly good position. Minimal left basilar subsegmental atelectasis is noted. The visualized skeletal structures are unremarkable. IMPRESSION: Minimal left basilar subsegmental atelectasis. Aortic Atherosclerosis (ICD10-I70.0). Electronically Signed   By: Lupita Raider M.D.   On: 03/29/2023 09:34    Labs:  CBC: Recent Labs    04/12/23 1837 04/13/23 0601 04/14/23 1151 04/16/23 0616  WBC 16.9* 14.9* 7.2 6.3  HGB 10.2* 9.3* 9.0* 9.1*  HCT 31.9* 30.2* 28.0* 29.2*  PLT 204 199 193 215    COAGS: Recent Labs    12/13/22 2340 12/14/22 0316 12/14/22 1037 12/14/22 1815 12/14/22 2340 12/22/22 1814  INR 2.6* 1.9* 1.6* 1.5* 1.4* 1.2  APTT 42* 39*  --   --   --   --     BMP: Recent Labs    04/12/23 1837 04/13/23 0601 04/14/23 1151 04/16/23 0616  NA 139 139 140 134*  K 4.2 4.0 3.6 3.4*  CL 100 103 100 94*  CO2 23 22 22 26   GLUCOSE 89 72 107* 96  BUN 45* 51* 63* 35*  CALCIUM 9.1 8.8* 8.5* 8.2*  CREATININE 8.57* 8.94* 10.31* 7.68*  GFRNONAA 6* 6* 5* 7*    LIVER FUNCTION TESTS: Recent Labs    12/28/22 0450 12/30/22 1628 03/01/23 1624 03/19/23 1420 03/29/23 0847 04/05/23 1646 04/07/23 1024 04/14/23 1151  BILITOT 1.0  --  0.7 0.7 0.6  --   --   --   AST 17  --  20 20 17   --   --   --   ALT 11  --  13 15 15   --   --   --   ALKPHOS 50  --  95 61 87  --   --   --   PROT 5.9*  --  7.9 6.0* 6.9  --   --   --   ALBUMIN 2.6*   < > 3.5 2.4* 2.5* 2.1* 2.0* 2.1*   < > = values in this interval not displayed.    TUMOR MARKERS: No results for input(s): "AFPTM", "CEA", "CA199", "CHROMGRNA" in the last 8760 hours.  Assessment and Plan: 77 y.o. male with ESRD with  bacteremia s/p perm cath removal on 10/4 for line holiday, he is in need of new HD access.   VSS CBC w/o leukocytosis, hgb 9.1, plt 215 2 repeat blood cx obtained on 10/3, so far no growth Will be on cefazidime with HD MWF till 05/24/23 per ID on Eliquis,  no need for hold for perm cath placement 2 g ancef for the procedure, signed and held   Risks and benefits discussed with the patient including, but not limited to bleeding, infection, vascular injury, pneumothorax which may require chest tube placement, air embolism or even death  All of the patient's questions were answered, patient is agreeable to proceed. Consent signed and in chart.  The procedure is tentatively scheduled for Monday pending IR schedule and repeat blood cx result.  PLAN - NPO except meds Monday [place]  - 2 g ancef to be given for the procedure, signed and held - will need an order for perm cath placement, nephrology aware   Thank you for this interesting consult.  I greatly enjoyed meeting  Tilman Neat and look forward to participating in their care.  A copy of this report was sent to the requesting provider on this date.  Electronically Signed: Willette Brace, PA-C 04/16/2023, 3:43 PM   I spent a total of 20 Minutes    in face to face in clinical consultation, greater than 50% of which was counseling/coordinating care for tunneled HD cath placement.   This chart was dictated using voice recognition software.  Despite best efforts to proofread,  errors can occur which can change the documentation meaning.

## 2023-04-16 NOTE — Progress Notes (Signed)
Received patient in bed to unit.    Informed consent signed and in chart.    TX duration: 3 hrs 15 mins     Transported back to floor  Hand-off given to patient's nurse.   Access used:  rt chest cvc Access issues: n/a  Total UF removed: 0 Medication(s) given: n/a       Maple Hudson, RN Dialysis Unit

## 2023-04-16 NOTE — Procedures (Signed)
PROCEDURE SUMMARY:  Successful removal of tunneled hemodialysis catheter.  Patient tolerated well.  EBL < 5 mL  See full dictation in Imaging for details.  Lynann Bologna Marjory Meints PA-C 04/16/2023 3:30 PM

## 2023-04-16 NOTE — Plan of Care (Signed)

## 2023-04-16 NOTE — Progress Notes (Signed)
PT Cancellation Note  Patient Details Name: IZAC FAULKENBERRY MRN: [id number] DOB: May 14, 1946   Cancelled Treatment:    Reason Eval/Treat Not Completed: Patient at procedure or test/unavailable Pt noted to be OTF at dialysis at this time. Will f/u as able.  Aleda Grana, PT, DPT 04/16/23, 9:02 AM   Sandi Mariscal 04/16/2023, 9:01 AM

## 2023-04-16 NOTE — TOC Progression Note (Addendum)
Transition of Care [facility]) - Progression Note    Patient Details  Name: Justin [name] MRN: [id number] Date of Birth: 10-15-1945  [facility]) CM/SW Contact  Haim Hansson A [place], [place] Phone Number: 04/16/2023, 3:02 PM  Clinical Narrative:     Update 1610 CSW started authorization for SNF for possible DC Monday.  Status pending: Status pending. Auth ID [id number]  CSW presented bed offer to pt and accepted rehab bed at [facility]. DC pending medical stability and insurance authorization.   TOC will continue to follow.   Expected Discharge Plan: Skilled Nursing Facility Barriers to Discharge: Continued Medical Work up, English as a second language teacher, SNF Pending bed offer  Expected Discharge Plan and Services In-house Referral: Clinical Social Work   Post Acute Care Choice: Skilled Nursing Facility Living arrangements for the past 2 months: Single Family Home                                       Social Determinants of Health (SDOH) Interventions SDOH Screenings   Food Insecurity: Food Insecurity Present (04/13/2023)  Housing: Low Risk  (04/13/2023)  Transportation Needs: Unmet Transportation Needs (04/13/2023)  Utilities: Not At Risk (04/13/2023)  Social Connections: Unknown (10/13/2022)   Received from [facility], [facility]  Stress: No Stress Concern Present (10/14/2022)   Received from [facility], [facility]  Tobacco Use: Medium Risk (04/12/2023)    Readmission Risk Interventions    03/30/2023    2:29 PM 11/14/2022   10:08 AM 09/18/2022    4:10 PM  Readmission Risk Prevention Plan  Transportation Screening Complete Complete Complete  Medication Review (RN Care Manager) Referral to Pharmacy Complete Complete  PCP or Specialist appointment within 3-5 days of discharge Complete Complete Complete  HRI or Home Care Consult Complete Complete Complete  SW Recovery Care/Counseling Consult Complete Complete   Palliative Care Screening Not Applicable Not  Applicable Complete  Skilled Nursing Facility Not Applicable Complete Not Applicable

## 2023-04-16 NOTE — Progress Notes (Signed)
PROGRESS NOTE    Justin [name]  ZOX:[id number] DOB: 1945/08/05 DOA: 04/12/2023 PCP: Pcp, No   Brief Narrative:   77 year old with history of ESRD on HD, GIB, rectal abscess, cholecystitis with percutaneous drain, diabetes mellitus type 2 diet-controlled, chronic hypotension on midodrine, ambulatory dysfunction/wheelchair-bound for the last 3-4 months was admitted to the hospital last month for ESRD and septic shock.  Patient was noted to have 3.5 cm ischial abscess drained by IR, cultures were negative completed antibiotic course in the hospital and was discharged on September 26.  Patient now presented back to the hospital again with persistent left groin pain  IR reviewed images, no obvious area requiring drainage.  Currently on empiric antibiotics. ID on board.   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 Leukocytosis. At baseline he has been nonambulatory for the past several months.  CT scan of the left hip again shows small fluid collection but after reviewing by IR it is not sufficient for any sort of aspiration therefore recommending IV antibiotics.  ID on board and recommend dialysis catheter holiday.  Repeat blood culture from yesterday negative in 12 hours.  Currently on Fortaz.  Plan for 6 weeks of antibiotic.    Paroxysmal atrial fibrillation with RVR (HCC) Continue amiodarone, digoxin, Eliquis.  Rate controlled at this time.   ESRD Monday Wednesday Friday  hemodialysis  Continue midodrine for chronic hypotension.  Nephrology on board for hemodialysis needs.  Blood pressure stable.  TDC will be removed today for line  holiday through the weekend.   History of cholecystitis Status post percutaneous cholecystostomy tube in place changed to regularly at [facility].  Not a surgical candidate as per previous evaluation   Chronic hypotension.  Takes midodrine at home.  Continue while in the hospital.  history of GI bleed Anemia of chronic kidney disease No current evidence.  On Eliquis.  Deconditioning, debility.  PT OT has seen the patient and recommend skilled nursing facility placement.  Has a wheelchair at baseline.  Pressure injury.  Present on admission.  Continue wound care.  Pressure Injury 12/14/22 Heel Left;Posterior Unstageable - Full thickness tissue loss in which the base of the injury is covered by slough (yellow, tan, gray, green or brown) and/or eschar (tan, brown or black) in the wound bed. (Active)  12/14/22 0700  Location: Heel  Location Orientation: Left;Posterior  Staging: Unstageable - Full thickness tissue loss in which the base of the injury is covered by slough (yellow, tan, gray, green or brown) and/or eschar (tan, brown or black) in the wound bed.  Wound Description (Comments):   Present on Admission: Yes     Pressure Injury 12/14/22 Heel Right Unstageable - Full thickness tissue loss in which the base of the injury is covered by slough (yellow, tan, gray, green or brown) and/or eschar (tan, brown or black) in the wound bed. (Active)  12/14/22 0700  Location: Heel  Location Orientation: Right  Staging: Unstageable - Full thickness tissue loss in which the base of the injury is covered by slough (yellow, tan, gray, green or brown) and/or eschar (tan, brown or black) in the wound bed.  Wound  Description (Comments):   Present on Admission: Yes    DVT prophylaxis: Eliquis  Consults performed.   Interventional radiology Infectious disease Nephrology.  Procedures. Hemodialysis Plan for [facility] removal today.  Code Status: DNR  Family Communication:   None at  bedside.  Subjective: Patient was seen and examined bedside states that he feels okay.  Denies any chest pain, shortness of breath, fever, nausea or vomiting.  Seen during hemodialysis.  Physical examination: Body mass index is 25.18 kg/m.   General:  Average built, not in obvious distress HENT:   No scleral pallor or icterus noted. Oral mucosa is moist.  Chest:   Diminished breath sounds bilaterally. No crackles or wheezes.  Right chest wall hemodialysis catheter in place CVS: S1 &S2 heard. No murmur.  Regular rate and rhythm. Abdomen: Soft, nontender, nondistended.  Bowel sounds are heard.  Right upper quadrant cholecystostomy drain in place.  Left hip tenderness on palpation. Extremities: No cyanosis, clubbing or edema.  Left lower extremity tenderness on palpation  psych: Alert, awake and oriented, normal mood CNS:  No cranial nerve deficits.  Power equal in all extremities.   Skin: Warm and dry.  Pressure injury of the left and right heel   Objective: Vitals:   04/16/23 1150 04/16/23 1220 04/16/23 1237 04/16/23 1244  BP: 132/73 (!) 147/66  125/61  Pulse:    (!) 56  Resp:    13  Temp:   97.7 F (36.5 C) 97.7 F (36.5 C)  TempSrc:   Axillary   SpO2:    95%  Weight:    79.6 kg  Height:        Intake/Output Summary (Last 24 hours) at 04/16/2023 1249 Last data filed at 04/16/2023 1244 Gross per 24 hour  Intake --  Output 74.8 ml  Net -74.8 ml   Filed Weights   04/16/23 0646 04/16/23 0847 04/16/23 1244  Weight: 79.7 kg 77.8 kg 79.6 kg    Scheduled Meds:  amiodarone  200 mg Oral Daily   apixaban  2.5 mg Oral BID   atorvastatin  40 mg Oral QHS   calcitRIOL  0.25 mcg Oral Daily   Chlorhexidine Gluconate Cloth  6 each Topical Daily   Chlorhexidine Gluconate Cloth  6 each Topical Q0600   cinacalcet  30 mg Oral Q supper   digoxin  0.0625 mg Oral QODAY   midodrine  20 mg Oral TID WC   pregabalin  75 mg Oral BID   sevelamer carbonate  800 mg Oral TID WC   Continuous  Infusions:  anticoagulant sodium citrate     cefTAZidime (FORTAZ)  IV     Followed by   Melene Muller ON 04/17/2023] cefTAZidime (FORTAZ)  IV       Data Reviewed:   CBC: Recent Labs  Lab 04/12/23 1837 04/13/23 0601 04/14/23 1151 04/16/23 0616  WBC 16.9* 14.9* 7.2 6.3  HGB 10.2* 9.3* 9.0* 9.1*  HCT 31.9* 30.2* 28.0* 29.2*  MCV 96.7 96.8 96.6 94.5  PLT 204 199 193 215   Basic Metabolic Panel: Recent Labs  Lab 04/12/23 1837 04/13/23 0601 04/14/23 1151 04/16/23 0616  NA 139 139 140 134*  K 4.2 4.0 3.6 3.4*  CL 100 103 100 94*  CO2 23 22 22 26   GLUCOSE 89 72 107* 96  BUN 45* 51* 63* 35*  CREATININE 8.57* 8.94* 10.31* 7.68*  CALCIUM 9.1 8.8* 8.5* 8.2*  MG  --   --   --  1.9  PHOS  --   --  5.4*  --    GFR: Estimated Creatinine Clearance: 8.1 mL/min (A) (by C-G formula based on SCr of 7.68 mg/dL (H)). Liver Function Tests: Recent Labs  Lab 04/14/23 1151  ALBUMIN 2.1*   No results for input(s): "LIPASE", "AMYLASE" in the last 168 hours. No results for input(s): "AMMONIA" in the last 168 hours. Coagulation Profile: No results for input(s): "INR", "PROTIME" in the last 168 hours. Cardiac Enzymes: No results for input(s): "CKTOTAL", "CKMB", "CKMBINDEX", "TROPONINI" in the last 168 hours. BNP (last 3 results) No results for input(s): "PROBNP" in the last 8760 hours. HbA1C: No results for input(s): "HGBA1C" in the last 72 hours. CBG: Recent Labs  Lab 04/12/23 1844 04/16/23 0459  GLUCAP 87 88   Lipid Profile: No results for input(s): "CHOL", "HDL", "LDLCALC", "TRIG", "CHOLHDL", "LDLDIRECT" in the last 72 hours. Thyroid Function Tests: No results for input(s): "TSH", "T4TOTAL", "FREET4", "T3FREE", "THYROIDAB" in the last 72 hours. Anemia Panel: No results for input(s): "VITAMINB12", "FOLATE", "FERRITIN", "TIBC", "IRON", "RETICCTPCT" in the last 72 hours. Sepsis Labs: Recent Labs  Lab 04/13/23 0601  PROCALCITON 7.08    Recent Results (from the past 240 hour(s))   Blood culture (routine x 2)     Status: Abnormal   Collection Time: 04/12/23  7:08 PM   Specimen: BLOOD RIGHT HAND  Result Value Ref Range Status   Specimen Description BLOOD RIGHT HAND  Final   Special Requests   Final    BOTTLES DRAWN AEROBIC AND ANAEROBIC Blood Culture adequate volume   Culture  Setup Time   Final    GRAM NEGATIVE RODS IN BOTH AEROBIC AND ANAEROBIC BOTTLES CRITICAL RESULT CALLED TO, READ BACK BY AND VERIFIED WITHCarmelia Bake PHARMD, AT 1041 04/13/23 Renato Shin Performed at [facility], [address], [place], [place] [address]    Culture KLEBSIELLA PNEUMONIAE (A)  Final   Report Status 04/16/2023 FINAL  Final   Organism ID, Bacteria KLEBSIELLA PNEUMONIAE  Final      Susceptibility   Klebsiella pneumoniae - MIC*    AMPICILLIN >=32 RESISTANT Resistant     CEFEPIME <=0.12 SENSITIVE Sensitive     CEFTAZIDIME <=1 SENSITIVE Sensitive     CEFTRIAXONE <=0.25 SENSITIVE Sensitive     CIPROFLOXACIN <=0.25 SENSITIVE Sensitive     GENTAMICIN <=1 SENSITIVE Sensitive     IMIPENEM <=0.25 SENSITIVE Sensitive     TRIMETH/SULFA <=20 SENSITIVE Sensitive     AMPICILLIN/SULBACTAM >=32 RESISTANT Resistant     PIP/TAZO >=128 RESISTANT Resistant     * KLEBSIELLA PNEUMONIAE  Blood Culture ID Panel (Reflexed)     Status: Abnormal   Collection Time: 04/12/23  7:08 PM  Result Value Ref Range Status   Enterococcus faecalis NOT DETECTED NOT DETECTED Final   Enterococcus Faecium NOT DETECTED NOT DETECTED Final   Listeria monocytogenes NOT DETECTED NOT DETECTED Final   Staphylococcus species NOT DETECTED NOT DETECTED Final   Staphylococcus aureus (BCID) NOT DETECTED NOT DETECTED Final   Staphylococcus epidermidis NOT DETECTED NOT DETECTED Final   Staphylococcus lugdunensis NOT DETECTED NOT DETECTED Final   Streptococcus species NOT DETECTED NOT DETECTED Final   Streptococcus agalactiae NOT DETECTED NOT DETECTED Final   Streptococcus pneumoniae NOT DETECTED NOT DETECTED Final    Streptococcus pyogenes NOT DETECTED NOT DETECTED Final   A.calcoaceticus-baumannii NOT DETECTED NOT DETECTED Final   Bacteroides fragilis NOT DETECTED NOT DETECTED Final   Enterobacterales DETECTED (A) NOT DETECTED Final    Comment: Enterobacterales represent a large order of gram negative bacteria,  not a single organism. CRITICAL RESULT CALLED TO, READ BACK BY AND VERIFIED WITH: B. AGEE PHARMD, AT 1041 04/13/23 D. VANHOOK    Enterobacter cloacae complex NOT DETECTED NOT DETECTED Final   Escherichia coli NOT DETECTED NOT DETECTED Final   Klebsiella aerogenes NOT DETECTED NOT DETECTED Final   Klebsiella oxytoca NOT DETECTED NOT DETECTED Final   Klebsiella pneumoniae DETECTED (A) NOT DETECTED Final    Comment: CRITICAL RESULT CALLED TO, READ BACK BY AND VERIFIED WITH: B. AGEE PHARMD, AT 1041 04/13/23 D. VANHOOK    Proteus species NOT DETECTED NOT DETECTED Final   Salmonella species NOT DETECTED NOT DETECTED Final   Serratia marcescens NOT DETECTED NOT DETECTED Final   Haemophilus influenzae NOT DETECTED NOT DETECTED Final   Neisseria meningitidis NOT DETECTED NOT DETECTED Final   Pseudomonas aeruginosa NOT DETECTED NOT DETECTED Final   Stenotrophomonas maltophilia NOT DETECTED NOT DETECTED Final   Candida albicans NOT DETECTED NOT DETECTED Final   Candida auris NOT DETECTED NOT DETECTED Final   Candida glabrata NOT DETECTED NOT DETECTED Final   Candida krusei NOT DETECTED NOT DETECTED Final   Candida parapsilosis NOT DETECTED NOT DETECTED Final   Candida tropicalis NOT DETECTED NOT DETECTED Final   Cryptococcus neoformans/gattii NOT DETECTED NOT DETECTED Final   CTX-M ESBL NOT DETECTED NOT DETECTED Final   Carbapenem resistance IMP NOT DETECTED NOT DETECTED Final   Carbapenem resistance KPC NOT DETECTED NOT DETECTED Final   Carbapenem resistance NDM NOT DETECTED NOT DETECTED Final   Carbapenem resist OXA 48 LIKE NOT DETECTED NOT DETECTED Final   Carbapenem resistance VIM NOT DETECTED  NOT DETECTED Final    Comment: Performed at [facility], [address], [place], [place] [address]  Blood culture (routine x 2)     Status: Abnormal   Collection Time: 04/12/23  7:38 PM   Specimen: BLOOD  Result Value Ref Range Status   Specimen Description BLOOD SITE NOT SPECIFIED  Final   Special Requests   Final    BOTTLES DRAWN AEROBIC AND ANAEROBIC Blood Culture adequate volume   Culture  Setup Time   Final    GRAM NEGATIVE RODS IN BOTH AEROBIC AND ANAEROBIC BOTTLES CRITICAL RESULT CALLED TO, READ BACK BY AND VERIFIED WITH: B. AGEE PHARMD, AT 1041 04/13/23 D. VANHOOK    Culture (A)  Final    KLEBSIELLA PNEUMONIAE SUSCEPTIBILITIES PERFORMED ON PREVIOUS CULTURE WITHIN THE LAST 5 DAYS. Performed at [facility], [address] [place], [place] [address]    Report Status 04/16/2023 FINAL  Final  Culture, blood (Routine X 2) w Reflex to ID Panel     Status: None (Preliminary result)   Collection Time: 04/15/23  8:32 PM   Specimen: BLOOD  Result Value Ref Range Status   Specimen Description BLOOD BLOOD LEFT ARM  Final   Special Requests   Final    BOTTLES DRAWN AEROBIC AND ANAEROBIC Blood Culture adequate volume   Culture   Final    NO GROWTH < 12 HOURS Performed at [facility], [address], [place], [place] [address]    Report Status PENDING  Incomplete  Culture, blood (Routine X 2) w Reflex to ID Panel     Status: None (Preliminary result)   Collection Time: 04/15/23  8:35 PM   Specimen: BLOOD  Result Value Ref Range Status   Specimen Description BLOOD BLOOD LEFT HAND  Final   Special Requests   Final    BOTTLES DRAWN AEROBIC  AND ANAEROBIC Blood Culture adequate volume   Culture   Final    NO GROWTH < 12 HOURS Performed at [facility], [address] [place], [place] [address]    Report Status PENDING  Incomplete       Radiology Studies: No results found.    LOS: 3 days   Joycelyn Das, MD [facility] If 7PM-7AM, please  contact night-coverage  04/16/2023, 12:49 PM

## 2023-04-16 NOTE — Care Management Important Message (Signed)
Important Message  Patient Details  Name: Justin [name] MRN: [id number] Date of Birth: 07-10-1946   Important Message Given:  Yes - Medicare IM     Dorena Bodo 04/16/2023, 2:14 PM

## 2023-04-16 NOTE — Progress Notes (Signed)
PHARMACY CONSULT NOTE FOR:  OUTPATIENT  PARENTERAL ANTIBIOTIC THERAPY (OPAT)  Indication: Klebsiella pneumoniae bacteremia Regimen: Ceftazidime 1 g MWF with HD End date: 05/25/23  IV antibiotic regimen communicated to nephrology, as patient will receive with HD  Thank you for allowing pharmacy to be a part of this patient's care.  Lora Paula, PharmD PGY-2 Infectious Diseases Pharmacy Resident 04/16/2023 10:04 AM

## 2023-04-16 NOTE — Progress Notes (Signed)
Physical Therapy Treatment Patient Details Name: Justin [name] MRN: [id number] DOB: 1946-01-26 Today's Date: 04/16/2023   History of Present Illness 77 y.o. male presents to [facility] hospital on 04/12/2023 with persistent L groin pain. Pt recently admitted in September for management of sepsis and ischial abscess. PMH includes ESRD on HD, GIB, rectal abscess, cholecystitis with drain, DMII, chronic hypotension, ambulatory dysfunction.    PT Comments  Pt seen for PT tx with pt agreeable. Pt is making steady improvements with mobility as pt is able to complete supine>sit with supervision, hospital bed features, using BUE to assist LLE to EOB. Pt requests BLE post op shoes be donned for standing attempts & PT assisted. Pt attempts sit>stand from elevated EOB but pt with limited ability to weight bear through BLE to push to standing. Continue to recommend post acute rehab <3 hours/day to maximize independence with mobility & decrease caregiver burden.    If plan is discharge home, recommend the following: Two people to help with walking and/or transfers;Two people to help with bathing/dressing/bathroom;Assistance with cooking/housework;Assist for transportation;Help with stairs or ramp for entrance   Can travel by private vehicle     No  Equipment Recommendations  Hospital bed    Recommendations for Other Services       Precautions / Restrictions Precautions Precaution Comments: RLQ cholecystitis drain Restrictions Weight Bearing Restrictions: No     Mobility  Bed Mobility Overal bed mobility: Needs Assistance Bed Mobility: Supine to Sit   Sidelying to sit: Supervision, Used rails, HOB elevated       General bed mobility comments: Uses UE to assist LLE to EOB but pt notes no pain in LLE today.    Transfers Overall transfer level: Needs assistance Equipment used: Rolling walker (2 wheels) Transfers: Sit to/from Stand Sit to Stand: Total assist           General transfer  comment: Attempted STS from elevated EOB with RW & cuing re: hand placement. Pt with decreased ability to weight bear through BLE to push to standing. Unable to clear buttocks even with PT assist.    Ambulation/Gait                   Stairs             Wheelchair Mobility     Tilt Bed    Modified Rankin (Stroke Patients Only)       Balance Overall balance assessment: Needs assistance Sitting-balance support: Single extremity supported, Feet supported Sitting balance-Leahy Scale: Fair                                      Cognition Arousal: Alert Behavior During Therapy: WFL for tasks assessed/performed Overall Cognitive Status: Within Functional Limits for tasks assessed                                          Exercises      General Comments General comments (skin integrity, edema, etc.): Pt requests BLE post op shoes be donned prior to standing attempts; PT provides total assist to don them.      Pertinent Vitals/Pain Pain Assessment Pain Assessment: Faces Faces Pain Scale: Hurts whole lot Pain Location: BLE with weight bearing 2/2 neuropathy Pain Descriptors / Indicators: Discomfort Pain Intervention(s): Monitored during session, Limited activity  within patient's tolerance    Home Living                          Prior Function            PT Goals (current goals can now be found in the care plan section) Acute Rehab PT Goals Patient Stated Goal: to return to standing PT Goal Formulation: With patient Time For Goal Achievement: 04/28/23 Potential to Achieve Goals: Fair Progress towards PT goals: Progressing toward goals    Frequency    Min 1X/week      PT Plan      Co-evaluation              AM-PAC PT "6 Clicks" Mobility   Outcome Measure  Help needed turning from your back to your side while in a flat bed without using bedrails?: A Little Help needed moving from lying on your  back to sitting on the side of a flat bed without using bedrails?: A Little Help needed moving to and from a bed to a chair (including a wheelchair)?: Total Help needed standing up from a chair using your arms (e.g., wheelchair or bedside chair)?: Total Help needed to walk in hospital room?: Total Help needed climbing 3-5 steps with a railing? : Total 6 Click Score: 10    End of Session Equipment Utilized During Treatment:  (BLE post op shoes) Activity Tolerance: Patient limited by pain Patient left:  (sitting EOB with nurse present)   PT Visit Diagnosis: Muscle weakness (generalized) (M62.81);Difficulty in walking, not elsewhere classified (R26.2);Other abnormalities of gait and mobility (R26.89);Pain Pain - Right/Left:  (bilateral) Pain - part of body: Ankle and joints of foot     Time: 1417-1430 PT Time Calculation (min) (ACUTE ONLY): 13 min  Charges:    $Therapeutic Activity: 8-22 mins PT General Charges $$ ACUTE PT VISIT: 1 Visit                     Aleda Grana, PT, DPT 04/16/23, 3:01 PM    Sandi Mariscal 04/16/2023, 2:59 PM

## 2023-04-16 NOTE — Progress Notes (Signed)
[facility] Progress Note   Subjective:   Patient seen on HD, feels his is improving. Denies SOB, CP, dizziness, nausea. Discussed plan for line holiday after HD today if IR is available, he is agreeable.   Objective Vitals:   04/16/23 0051 04/16/23 0501 04/16/23 0646 04/16/23 0806  BP: 103/60 114/64  (!) 92/53  Pulse: 60 63  62  Resp:  18  18  Temp:  98.5 F (36.9 C)  97.8 F (36.6 C)  TempSrc:  Oral    SpO2:  98%  94%  Weight:   79.7 kg   Height:       Physical Exam General: Alert male in NAD Heart: RRR, no murmurs, rubs or gallops Lungs: CTA bilaterally, respirations unlabored Abdomen: Soft, non-distended, +BS Extremities: No edema b/l lower extremities Dialysis Access: R internal jugular [facility]  Additional Objective Labs: Basic Metabolic Panel: Recent Labs  Lab 04/13/23 0601 04/14/23 1151 04/16/23 0616  NA 139 140 134*  K 4.0 3.6 3.4*  CL 103 100 94*  CO2 22 22 26   GLUCOSE 72 107* 96  BUN 51* 63* 35*  CREATININE 8.94* 10.31* 7.68*  CALCIUM 8.8* 8.5* 8.2*  PHOS  --  5.4*  --    Liver Function Tests: Recent Labs  Lab 04/14/23 1151  ALBUMIN 2.1*   No results for input(s): "LIPASE", "AMYLASE" in the last 168 hours. CBC: Recent Labs  Lab 04/12/23 1837 04/13/23 0601 04/14/23 1151 04/16/23 0616  WBC 16.9* 14.9* 7.2 6.3  HGB 10.2* 9.3* 9.0* 9.1*  HCT 31.9* 30.2* 28.0* 29.2*  MCV 96.7 96.8 96.6 94.5  PLT 204 199 193 215   Blood Culture    Component Value Date/Time   SDES BLOOD BLOOD LEFT HAND 04/15/2023 2035   SPECREQUEST  04/15/2023 2035    BOTTLES DRAWN AEROBIC AND ANAEROBIC Blood Culture adequate volume   CULT  04/15/2023 2035    NO GROWTH < 12 HOURS Performed at [facility], [address], [place], [place] [address]    REPTSTATUS PENDING 04/15/2023 2035    Cardiac Enzymes: No results for input(s): "CKTOTAL", "CKMB", "CKMBINDEX", "TROPONINI" in the last 168 hours. CBG: Recent Labs  Lab 04/12/23 1844 04/16/23 0459   GLUCAP 87 88   Iron Studies: No results for input(s): "IRON", "TIBC", "TRANSFERRIN", "FERRITIN" in the last 72 hours. @lablastinr3 @ Studies/Results: No results found. Medications:  anticoagulant sodium citrate     cefTRIAXone (ROCEPHIN)  IV Stopped (04/15/23 2210)    amiodarone  200 mg Oral Daily   apixaban  2.5 mg Oral BID   atorvastatin  40 mg Oral QHS   calcitRIOL  0.25 mcg Oral Daily   Chlorhexidine Gluconate Cloth  6 each Topical Daily   Chlorhexidine Gluconate Cloth  6 each Topical Q0600   cinacalcet  30 mg Oral Q supper   digoxin  0.0625 mg Oral QODAY   metroNIDAZOLE  500 mg Oral Q12H   midodrine  20 mg Oral TID WC   pregabalin  75 mg Oral BID   sevelamer carbonate  800 mg Oral TID WC    Dialysis Orders: [facility] M[facility]  3h  450/1.5    82kg  2/2.5 bath  TDC   Heparin none - last OP HD 9/27, post wt 82.3kg   CXR 9/30 - no active disease    Assessment/Plan: GNR bacteremia / acute L hip osteomyelitis/ hip pain - On IV rocephin and po flagyl.  ID planning 6 week total abx course. Pt has TDC in  place. Ordering f/u surveillance blood cx's today. ID has requested line holiday, will consult IR and see if we can remove TDC post HD today for line holiday through the weekend.   Afib / RVR - per pmd ESRD - on HD MWF. Continue MWF schedule, will see if we can coordinate line holiday post HD today, see above.  BP/ chronic hypotension - takes midodrine 20 tid at home, continued here. BP's stable. Volume - at dry wt, no edema per CXR, euvolemic on exam. Under dry wt.  Anemia esrd - Hb 9.1, will start ESA if Hgb remains below goal. MBD ckd - CCa and phos controlled. Cont sensipar, po vdra and renvela as binder.  H/o cholecystitis - has chole tube changed out by [facility] regularly  Rogers Blocker, PA-C 04/16/2023, 8:08 AM  [facility] Pager: [phone]

## 2023-04-16 NOTE — Progress Notes (Signed)
Advised by CSW that pt has been accepted to snf in [place], [place]. Pt's HD clinic will need to be changed to [facility]. Referral submitted to [facility] admissions today to request this transfer. Will advise new clinic of pt's need for iv ceftazidime with HD at d/c. Will assist as needed.   Olivia Canter Renal Navigator [phone]

## 2023-04-17 DIAGNOSIS — R6510 Systemic inflammatory response syndrome (SIRS) of non-infectious origin without acute organ dysfunction: Secondary | ICD-10-CM | POA: Diagnosis not present

## 2023-04-17 DIAGNOSIS — I480 Paroxysmal atrial fibrillation: Secondary | ICD-10-CM | POA: Diagnosis not present

## 2023-04-17 DIAGNOSIS — N186 End stage renal disease: Secondary | ICD-10-CM | POA: Diagnosis not present

## 2023-04-17 DIAGNOSIS — M25552 Pain in left hip: Secondary | ICD-10-CM | POA: Diagnosis not present

## 2023-04-17 NOTE — Progress Notes (Signed)
PROGRESS NOTE    Justin [name]  NGE:[id number] DOB: 1945-12-16 DOA: 04/12/2023 PCP: Pcp, No   Brief Narrative:   77 year old with history of ESRD on HD, GIB, rectal abscess, cholecystitis with percutaneous drain, diabetes mellitus type 2 diet-controlled, chronic hypotension on midodrine, ambulatory dysfunction/wheelchair-bound for the last 3-4 months was admitted to the hospital last month for ESRD and septic shock.  Patient was noted to have 3.5 cm ischial abscess drained by IR, cultures were negative completed antibiotic course in the hospital and was discharged on September 26.  Patient now presented back to the hospital again with persistent left groin pain  IR reviewed images, no obvious area requiring drainage.  Currently on empiric antibiotics. ID on board.   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 Leukocytosis. At baseline he has been nonambulatory for the past several months.  CT scan of the left hip again shows small fluid collection but after reviewing by IR it is not sufficient for any sort of aspiration therefore recommending IV antibiotics.  ID on board and recommend dialysis catheter holiday.  Repeat blood culture from 04/15/2023 negative in 2 days. \  Currently on Fortaz.  Plan for 6 weeks of antibiotic.    Paroxysmal atrial fibrillation with RVR (HCC) Continue amiodarone, digoxin, Eliquis.  Rate controlled at this time.   ESRD Monday Wednesday Friday  hemodialysis  Continue midodrine for chronic hypotension.  Nephrology on board for hemodialysis needs.  Blood pressure stable.  TDC was removed 04/16/23 for line  holiday through the weekend.   History of cholecystitis Status post percutaneous cholecystostomy tube in place changed to regularly at [facility].  Not a surgical candidate as per previous evaluation   Chronic hypotension.  Takes midodrine at home.  Continue while in the hospital.  history of GI bleed Anemia of chronic kidney disease No current evidence.  On Eliquis.  Deconditioning, debility.  PT OT has seen the patient and recommend skilled nursing facility placement.  Has a wheelchair at baseline.  Pressure injury.  Present on admission.  Continue wound care.  Pressure Injury 12/14/22 Heel Left;Posterior Unstageable - Full thickness tissue loss in which the base of the injury is covered by slough (yellow, tan, gray, green or brown) and/or eschar (tan, brown or black) in the wound bed. (Active)  12/14/22 0700  Location: Heel  Location Orientation: Left;Posterior  Staging: Unstageable - Full thickness tissue loss in which the base of the injury is covered by slough (yellow, tan, gray, green or brown) and/or eschar (tan, brown or black) in the wound bed.  Wound Description (Comments):   Present on Admission: Yes     Pressure Injury 12/14/22 Heel Right Unstageable - Full thickness tissue loss in which the base of the injury is covered by slough (yellow, tan, gray, green or brown) and/or eschar (tan, brown or black) in the wound bed. (Active)  12/14/22 0700  Location: Heel  Location Orientation: Right  Staging: Unstageable - Full thickness tissue loss in which the base of the injury is covered by slough (yellow, tan, gray, green or brown) and/or eschar (tan, brown or black) in the wound bed.  Wound  Description (Comments):   Present on Admission: Yes    DVT prophylaxis: Eliquis  Consults performed.   Interventional radiology Infectious disease Nephrology.  Procedures. Hemodialysis  TDC removal on 04/16/2023  Code Status: DNR  Family Communication:   None at  bedside.  Subjective: Patient was seen and examined bedside. Denies any fever, shortness of breath, chest pain, nausea, vomiting.  Physical examination: Body mass index is 25.94 kg/m.   General:  Average built, not in obvious distress HENT:   No scleral pallor or icterus noted. Oral mucosa is moist.  Chest:   Diminished breath sounds bilaterally. No crackles or wheezes.  Dressing in the right chest wall. CVS: S1 &S2 heard. No murmur.  Regular rate and rhythm. Abdomen: Soft, nontender, nondistended.  Bowel sounds are heard.  Right upper quadrant cholecystostomy drain in place.  Left hip tenderness on palpation. Extremities: No cyanosis, clubbing or edema.  Left lower extremity tenderness on palpation  psych: Alert, awake and oriented, normal mood CNS:  No cranial nerve deficits.  Power equal in all extremities.   Skin: Warm and dry.  Pressure injury of the left and right heel   Objective: Vitals:   04/16/23 2232 04/17/23 0431 04/17/23 0509 04/17/23 0800  BP: 122/68 112/63  (!) 114/51  Pulse: (!) 56 64  79  Resp: 18 18    Temp: 97.9 F (36.6 C) (!) 97.3 F (36.3 C)  97.7 F (36.5 C)  TempSrc: Oral Oral  Oral  SpO2: 99% 100%  99%  Weight:   82 kg   Height:        Intake/Output Summary (Last 24 hours) at 04/17/2023 0854 Last data filed at 04/16/2023 1244 Gross per 24 hour  Intake --  Output -0.2 ml  Net 0.2 ml   Filed Weights   04/16/23 0847 04/16/23 1244 04/17/23 0509  Weight: 77.8 kg 79.6 kg 82 kg    Scheduled Meds:  amiodarone  200 mg Oral Daily   apixaban  2.5 mg Oral BID   atorvastatin  40 mg Oral QHS   calcitRIOL  0.25 mcg Oral Daily   Chlorhexidine Gluconate Cloth  6 each Topical Daily   Chlorhexidine Gluconate Cloth  6 each Topical Q0600   cinacalcet  30 mg Oral Q supper   digoxin  0.0625 mg Oral QODAY   midodrine  20 mg Oral TID WC   pregabalin  75 mg Oral BID   sevelamer carbonate  800 mg Oral TID WC   Continuous Infusions:  cefTAZidime (FORTAZ)  IV      Followed by   cefTAZidime (FORTAZ)  IV       Data Reviewed:   CBC: Recent Labs  Lab 04/12/23 1837 04/13/23 0601 04/14/23 1151 04/16/23 0616  WBC 16.9* 14.9* 7.2 6.3  HGB 10.2* 9.3* 9.0* 9.1*  HCT 31.9* 30.2* 28.0* 29.2*  MCV 96.7 96.8 96.6 94.5  PLT 204 199 193 215   Basic Metabolic Panel: Recent Labs  Lab 04/12/23 1837 04/13/23 0601 04/14/23 1151 04/16/23 0616  NA 139 139 140 134*  K 4.2 4.0 3.6 3.4*  CL 100 103 100 94*  CO2 23 22 22 26   GLUCOSE 89 72 107* 96  BUN 45* 51* 63* 35*  CREATININE 8.57* 8.94* 10.31* 7.68*  CALCIUM 9.1 8.8* 8.5* 8.2*  MG  --   --   --  1.9  PHOS  --   --  5.4*  --    GFR: Estimated Creatinine Clearance: 8.1 mL/min (A) (by C-G formula based  on SCr of 7.68 mg/dL (H)). Liver Function Tests: Recent Labs  Lab 04/14/23 1151  ALBUMIN 2.1*   No results for input(s): "LIPASE", "AMYLASE" in the last 168 hours. No results for input(s): "AMMONIA" in the last 168 hours. Coagulation Profile: No results for input(s): "INR", "PROTIME" in the last 168 hours. Cardiac Enzymes: No results for input(s): "CKTOTAL", "CKMB", "CKMBINDEX", "TROPONINI" in the last 168 hours. BNP (last 3 results) No results for input(s): "PROBNP" in the last 8760 hours. HbA1C: No results for input(s): "HGBA1C" in the last 72 hours. CBG: Recent Labs  Lab 04/12/23 1844 04/16/23 0459  GLUCAP 87 88   Lipid Profile: No results for input(s): "CHOL", "HDL", "LDLCALC", "TRIG", "CHOLHDL", "LDLDIRECT" in the last 72 hours. Thyroid Function Tests: No results for input(s): "TSH", "T4TOTAL", "FREET4", "T3FREE", "THYROIDAB" in the last 72 hours. Anemia Panel: No results for input(s): "VITAMINB12", "FOLATE", "FERRITIN", "TIBC", "IRON", "RETICCTPCT" in the last 72 hours. Sepsis Labs: Recent Labs  Lab 04/13/23 0601  PROCALCITON 7.08    Recent Results (from the past 240 hour(s))  Blood culture (routine x 2)     Status: Abnormal   Collection Time: 04/12/23  7:08 PM    Specimen: BLOOD RIGHT HAND  Result Value Ref Range Status   Specimen Description BLOOD RIGHT HAND  Final   Special Requests   Final    BOTTLES DRAWN AEROBIC AND ANAEROBIC Blood Culture adequate volume   Culture  Setup Time   Final    GRAM NEGATIVE RODS IN BOTH AEROBIC AND ANAEROBIC BOTTLES CRITICAL RESULT CALLED TO, READ BACK BY AND VERIFIED WITHCarmelia Bake PHARMD, AT 1041 04/13/23 Renato Shin Performed at [facility], [address] [place], [place] [address]    Culture KLEBSIELLA PNEUMONIAE (A)  Final   Report Status 04/16/2023 FINAL  Final   Organism ID, Bacteria KLEBSIELLA PNEUMONIAE  Final      Susceptibility   Klebsiella pneumoniae - MIC*    AMPICILLIN >=32 RESISTANT Resistant     CEFEPIME <=0.12 SENSITIVE Sensitive     CEFTAZIDIME <=1 SENSITIVE Sensitive     CEFTRIAXONE <=0.25 SENSITIVE Sensitive     CIPROFLOXACIN <=0.25 SENSITIVE Sensitive     GENTAMICIN <=1 SENSITIVE Sensitive     IMIPENEM <=0.25 SENSITIVE Sensitive     TRIMETH/SULFA <=20 SENSITIVE Sensitive     AMPICILLIN/SULBACTAM >=32 RESISTANT Resistant     PIP/TAZO >=128 RESISTANT Resistant     * KLEBSIELLA PNEUMONIAE  Blood Culture ID Panel (Reflexed)     Status: Abnormal   Collection Time: 04/12/23  7:08 PM  Result Value Ref Range Status   Enterococcus faecalis NOT DETECTED NOT DETECTED Final   Enterococcus Faecium NOT DETECTED NOT DETECTED Final   Listeria monocytogenes NOT DETECTED NOT DETECTED Final   Staphylococcus species NOT DETECTED NOT DETECTED Final   Staphylococcus aureus (BCID) NOT DETECTED NOT DETECTED Final   Staphylococcus epidermidis NOT DETECTED NOT DETECTED Final   Staphylococcus lugdunensis NOT DETECTED NOT DETECTED Final   Streptococcus species NOT DETECTED NOT DETECTED Final   Streptococcus agalactiae NOT DETECTED NOT DETECTED Final   Streptococcus pneumoniae NOT DETECTED NOT DETECTED Final   Streptococcus pyogenes NOT DETECTED NOT DETECTED Final   A.calcoaceticus-baumannii NOT DETECTED  NOT DETECTED Final   Bacteroides fragilis NOT DETECTED NOT DETECTED Final   Enterobacterales DETECTED (A) NOT DETECTED Final    Comment: Enterobacterales represent a large order of gram negative bacteria, not a single organism. CRITICAL RESULT CALLED TO, READ BACK BY AND VERIFIED WITH: B. AGEE PHARMD,  AT 1041 04/13/23 D. VANHOOK    Enterobacter cloacae complex NOT DETECTED NOT DETECTED Final   Escherichia coli NOT DETECTED NOT DETECTED Final   Klebsiella aerogenes NOT DETECTED NOT DETECTED Final   Klebsiella oxytoca NOT DETECTED NOT DETECTED Final   Klebsiella pneumoniae DETECTED (A) NOT DETECTED Final    Comment: CRITICAL RESULT CALLED TO, READ BACK BY AND VERIFIED WITH: B. AGEE PHARMD, AT 1041 04/13/23 D. VANHOOK    Proteus species NOT DETECTED NOT DETECTED Final   Salmonella species NOT DETECTED NOT DETECTED Final   Serratia marcescens NOT DETECTED NOT DETECTED Final   Haemophilus influenzae NOT DETECTED NOT DETECTED Final   Neisseria meningitidis NOT DETECTED NOT DETECTED Final   Pseudomonas aeruginosa NOT DETECTED NOT DETECTED Final   Stenotrophomonas maltophilia NOT DETECTED NOT DETECTED Final   Candida albicans NOT DETECTED NOT DETECTED Final   Candida auris NOT DETECTED NOT DETECTED Final   Candida glabrata NOT DETECTED NOT DETECTED Final   Candida krusei NOT DETECTED NOT DETECTED Final   Candida parapsilosis NOT DETECTED NOT DETECTED Final   Candida tropicalis NOT DETECTED NOT DETECTED Final   Cryptococcus neoformans/gattii NOT DETECTED NOT DETECTED Final   CTX-M ESBL NOT DETECTED NOT DETECTED Final   Carbapenem resistance IMP NOT DETECTED NOT DETECTED Final   Carbapenem resistance KPC NOT DETECTED NOT DETECTED Final   Carbapenem resistance NDM NOT DETECTED NOT DETECTED Final   Carbapenem resist OXA 48 LIKE NOT DETECTED NOT DETECTED Final   Carbapenem resistance VIM NOT DETECTED NOT DETECTED Final    Comment: Performed at [facility], [address] [place],  [place] [address]  Blood culture (routine x 2)     Status: Abnormal   Collection Time: 04/12/23  7:38 PM   Specimen: BLOOD  Result Value Ref Range Status   Specimen Description BLOOD SITE NOT SPECIFIED  Final   Special Requests   Final    BOTTLES DRAWN AEROBIC AND ANAEROBIC Blood Culture adequate volume   Culture  Setup Time   Final    GRAM NEGATIVE RODS IN BOTH AEROBIC AND ANAEROBIC BOTTLES CRITICAL RESULT CALLED TO, READ BACK BY AND VERIFIED WITH: B. AGEE PHARMD, AT 1041 04/13/23 D. VANHOOK    Culture (A)  Final    KLEBSIELLA PNEUMONIAE SUSCEPTIBILITIES PERFORMED ON PREVIOUS CULTURE WITHIN THE LAST 5 DAYS. Performed at [facility], [address], [place], [place] [address]    Report Status 04/16/2023 FINAL  Final  Culture, blood (Routine X 2) w Reflex to ID Panel     Status: None (Preliminary result)   Collection Time: 04/15/23  8:32 PM   Specimen: BLOOD  Result Value Ref Range Status   Specimen Description BLOOD BLOOD LEFT ARM  Final   Special Requests   Final    BOTTLES DRAWN AEROBIC AND ANAEROBIC Blood Culture adequate volume   Culture   Final    NO GROWTH 2 DAYS Performed at [facility], [address], [place], [place] [address]    Report Status PENDING  Incomplete  Culture, blood (Routine X 2) w Reflex to ID Panel     Status: None (Preliminary result)   Collection Time: 04/15/23  8:35 PM   Specimen: BLOOD  Result Value Ref Range Status   Specimen Description BLOOD BLOOD LEFT HAND  Final   Special Requests   Final    BOTTLES DRAWN AEROBIC AND ANAEROBIC Blood Culture adequate volume   Culture   Final    NO GROWTH 2  DAYS Performed at [facility], [address] [place], [place] [address]    Report Status PENDING  Incomplete       Radiology Studies: IR Removal Tun Cv Cath W/O FL  Result Date: 04/16/2023 INDICATION: 77 year old male with history of ESRD, tunneled dialysis catheter in place presents with bacteremia. Request for tunneled dialysis  catheter removal for line holiday. EXAM: REMOVAL OF TUNNELED HEMODIALYSIS CATHETER MEDICATIONS: None COMPLICATIONS: None immediate. PROCEDURE: Informed written consent was obtained from the patient following an explanation of the procedure, risks, benefits and alternatives to treatment. A time out was performed prior to the initiation of the procedure. Sterile technique was utilized including mask, sterile gloves, sterile drape, and hand hygiene. ChloraPrep was used to prep the patient's right neck, chest and existing catheter. The catheter was removed intact without difficulty by applying gentle manual traction. Hemostasis was obtained with manual compression. A dressing was placed. The patient tolerated the procedure well without immediate post procedural complication. IMPRESSION: Successful removal of tunneled dialysis catheter. Performed by: Lawernce Ion, PA-C Electronically Signed   By: Corlis Leak M.D.   On: 04/16/2023 16:57      LOS: 4 days   Joycelyn Das, MD [facility] If 7PM-7AM, please contact night-coverage  04/17/2023, 8:54 AM

## 2023-04-17 NOTE — Progress Notes (Signed)
Justin [name] Progress Note   Subjective:   IR removed TDC yesterday, we appreciate their assistance. Pt reports feeling well today, denies SOB, CP, dizziness, nausea. Reports HD went well yesterday.   Objective Vitals:   04/16/23 2232 04/17/23 0431 04/17/23 0509 04/17/23 0800  BP: 122/68 112/63  (!) 114/51  Pulse: (!) 56 64  79  Resp: 18 18    Temp: 97.9 F (36.6 C) (!) 97.3 F (36.3 C)  97.7 F (36.5 C)  TempSrc: Oral Oral  Oral  SpO2: 99% 100%  99%  Weight:   82 kg   Height:       Physical Exam General: Alert male in NAD Heart: RRR, no murmurs, rubs or gallops Lungs: CTA bilaterally, respirations unlabored on RA Abdomen: Soft, non-distended, +BS Extremities: No edema b/l lower extremities Dialysis Access:  R internal jugular TDC out, bandage over site  Additional Objective Labs: Basic Metabolic Panel: Recent Labs  Lab 04/13/23 0601 04/14/23 1151 04/16/23 0616  NA 139 140 134*  K 4.0 3.6 3.4*  CL 103 100 94*  CO2 22 22 26   GLUCOSE 72 107* 96  BUN 51* 63* 35*  CREATININE 8.94* 10.31* 7.68*  CALCIUM 8.8* 8.5* 8.2*  PHOS  --  5.4*  --    Liver Function Tests: Recent Labs  Lab 04/14/23 1151  ALBUMIN 2.1*   No results for input(s): "LIPASE", "AMYLASE" in the last 168 hours. CBC: Recent Labs  Lab 04/12/23 1837 04/13/23 0601 04/14/23 1151 04/16/23 0616  WBC 16.9* 14.9* 7.2 6.3  HGB 10.2* 9.3* 9.0* 9.1*  HCT 31.9* 30.2* 28.0* 29.2*  MCV 96.7 96.8 96.6 94.5  PLT 204 199 193 215   Blood Culture    Component Value Date/Time   SDES BLOOD BLOOD LEFT HAND 04/15/2023 2035   SPECREQUEST  04/15/2023 2035    BOTTLES DRAWN AEROBIC AND ANAEROBIC Blood Culture adequate volume   CULT  04/15/2023 2035    NO GROWTH 2 DAYS Performed at [facility], [address] [place], [place] [address]    REPTSTATUS PENDING 04/15/2023 2035    Cardiac Enzymes: No results for input(s): "CKTOTAL", "CKMB", "CKMBINDEX", "TROPONINI" in the last 168  hours. CBG: Recent Labs  Lab 04/12/23 1844 04/16/23 0459  GLUCAP 87 88   Iron Studies: No results for input(s): "IRON", "TIBC", "TRANSFERRIN", "FERRITIN" in the last 72 hours. @lablastinr3 @ Studies/Results: IR Removal Tun Cv Cath W/O FL  Result Date: 04/16/2023 INDICATION: 77 year old male with history of ESRD, tunneled dialysis catheter in place presents with bacteremia. Request for tunneled dialysis catheter removal for line holiday. EXAM: REMOVAL OF TUNNELED HEMODIALYSIS CATHETER MEDICATIONS: None COMPLICATIONS: None immediate. PROCEDURE: Informed written consent was obtained from the patient following an explanation of the procedure, risks, benefits and alternatives to treatment. A time out was performed prior to the initiation of the procedure. Sterile technique was utilized including mask, sterile gloves, sterile drape, and hand hygiene. ChloraPrep was used to prep the patient's right neck, chest and existing catheter. The catheter was removed intact without difficulty by applying gentle manual traction. Hemostasis was obtained with manual compression. A dressing was placed. The patient tolerated the procedure well without immediate post procedural complication. IMPRESSION: Successful removal of tunneled dialysis catheter. Performed by: Justin Ion, PA-C Electronically Signed   By: Justin Leak M.D.   On: 04/16/2023 16:57   Medications:  cefTAZidime (FORTAZ)  IV     Followed by   cefTAZidime (FORTAZ)  IV      amiodarone  200 mg Oral Daily   apixaban  2.5 mg Oral BID   atorvastatin  40 mg Oral QHS   calcitRIOL  0.25 mcg Oral Daily   Chlorhexidine Gluconate Cloth  6 each Topical Daily   Chlorhexidine Gluconate Cloth  6 each Topical Q0600   cinacalcet  30 mg Oral Q supper   digoxin  0.0625 mg Oral QODAY   midodrine  20 mg Oral TID WC   pregabalin  75 mg Oral BID   sevelamer carbonate  800 mg Oral TID WC    Dialysis Orders: [facility] M[facility]  3h  450/1.5    82kg  2/2.5 bath  TDC    Heparin none - last OP HD 9/27, post wt 82.3kg   CXR 9/30 - no active disease  Assessment/Plan: GNR bacteremia / acute L hip osteomyelitis/ hip pain - On IV rocephin and po flagyl.  ID planning 6 week total abx course. Pt has TDC in place. Ordering f/u surveillance blood cx's today. ID has requested line holiday, IR removed TDC on 10/4, tentatively plan to replace on Monday, 10/7.  Afib / RVR - per pmd ESRD - on HD MWF. Continue MWF schedule, see above.  BP/ chronic hypotension - takes midodrine 20 tid at home, continued here. BP's stable. Volume - at dry wt, no edema per CXR, euvolemic on exam. Under dry wt.  Anemia esrd - Hb 9.1, will start ESA if Hgb remains below goal. MBD ckd - CCa and phos controlled. Cont sensipar, po vdra and renvela as binder.   Justin Blocker, PA-C 04/17/2023, 8:15 AM  [name] Pager: [phone]

## 2023-04-17 NOTE — Progress Notes (Signed)
Pt refused for RN to assess wounds; Educated on importance of ongoing assessment for new or worsening signs/symptoms. Pt declined assessment. RN placed order for wound consult.

## 2023-04-18 DIAGNOSIS — R6510 Systemic inflammatory response syndrome (SIRS) of non-infectious origin without acute organ dysfunction: Secondary | ICD-10-CM | POA: Diagnosis not present

## 2023-04-18 DIAGNOSIS — N186 End stage renal disease: Secondary | ICD-10-CM | POA: Diagnosis not present

## 2023-04-18 DIAGNOSIS — M25552 Pain in left hip: Secondary | ICD-10-CM | POA: Diagnosis not present

## 2023-04-18 DIAGNOSIS — I480 Paroxysmal atrial fibrillation: Secondary | ICD-10-CM | POA: Diagnosis not present

## 2023-04-18 LAB — CBC
HCT: 31.1 % — ABNORMAL LOW (ref 39.0–52.0)
Hemoglobin: 9.6 g/dL — ABNORMAL LOW (ref 13.0–17.0)
MCH: 29.2 pg (ref 26.0–34.0)
MCHC: 30.9 g/dL (ref 30.0–36.0)
MCV: 94.5 fL (ref 80.0–100.0)
Platelets: 234 10*3/uL (ref 150–400)
RBC: 3.29 MIL/uL — ABNORMAL LOW (ref 4.22–5.81)
RDW: 15.9 % — ABNORMAL HIGH (ref 11.5–15.5)
WBC: 8 10*3/uL (ref 4.0–10.5)
nRBC: 0 % (ref 0.0–0.2)

## 2023-04-18 LAB — BASIC METABOLIC PANEL
Anion gap: 14 (ref 5–15)
BUN: 25 mg/dL — ABNORMAL HIGH (ref 8–23)
CO2: 25 mmol/L (ref 22–32)
Calcium: 8 mg/dL — ABNORMAL LOW (ref 8.9–10.3)
Chloride: 95 mmol/L — ABNORMAL LOW (ref 98–111)
Creatinine, Ser: 7.01 mg/dL — ABNORMAL HIGH (ref 0.61–1.24)
GFR, Estimated: 7 mL/min — ABNORMAL LOW (ref >60)
Glucose, Bld: 79 mg/dL (ref 70–99)
Potassium: 4 mmol/L (ref 3.5–5.1)
Sodium: 134 mmol/L — ABNORMAL LOW (ref 135–145)

## 2023-04-18 LAB — GLUCOSE, CAPILLARY
Glucose-Capillary: 91 mg/dL (ref 70–99)
Glucose-Capillary: 89 mg/dL (ref 70–99)
Glucose-Capillary: 92 mg/dL (ref 70–99)

## 2023-04-18 LAB — MAGNESIUM: Magnesium: 1.8 mg/dL (ref 1.7–2.4)

## 2023-04-18 MED ORDER — PREGABALIN 75 MG PO CAPS
75.0000 mg | ORAL_CAPSULE | Freq: Every day | ORAL | Status: DC
  Administered 2023-04-19 – 2023-04-21 (×3): 75 mg via ORAL
  Filled 2023-04-18 (×3): qty 1

## 2023-04-18 MED ORDER — CHLORHEXIDINE GLUCONATE CLOTH 2 % EX PADS
6.0000 | MEDICATED_PAD | Freq: Every day | CUTANEOUS | Status: DC
  Administered 2023-04-18 – 2023-04-20 (×2): 6 via TOPICAL

## 2023-04-18 NOTE — Progress Notes (Signed)
[facility] Progress Note   Subjective:   Seen in room, eating breakfast. Reports he is feeling well. Denies SOB, CP, dizziness, nausea.   Objective Vitals:   04/17/23 1615 04/17/23 2023 04/18/23 0504 04/18/23 0747  BP: 118/61 121/75 116/71 119/64  Pulse: (!) 56 60 67 69  Resp: 16 18 18 17   Temp: 97.6 F (36.4 C) 98.3 F (36.8 C) 98.2 F (36.8 C) 97.8 F (36.6 C)  TempSrc: Oral Oral Oral Oral  SpO2: 100% 97% 99% 100%  Weight:      Height:       Physical Exam General: Alert male in NAD Heart: RRR, no murmurs, rubs or gallops Lungs: CTA bilaterally, respirations unlabored on RA Abdomen: Soft, non-distended, +BS Extremities: No edema b/l lower extremities Dialysis Access:  R internal jugular TDC out, bandage over site  Additional Objective Labs: Basic Metabolic Panel: Recent Labs  Lab 04/14/23 1151 04/16/23 0616 04/18/23 0614  NA 140 134* 134*  K 3.6 3.4* 4.0  CL 100 94* 95*  CO2 22 26 25   GLUCOSE 107* 96 79  BUN 63* 35* 25*  CREATININE 10.31* 7.68* 7.01*  CALCIUM 8.5* 8.2* 8.0*  PHOS 5.4*  --   --    Liver Function Tests: Recent Labs  Lab 04/14/23 1151  ALBUMIN 2.1*   No results for input(s): "LIPASE", "AMYLASE" in the last 168 hours. CBC: Recent Labs  Lab 04/12/23 1837 04/13/23 0601 04/14/23 1151 04/16/23 0616 04/18/23 0614  WBC 16.9* 14.9* 7.2 6.3 8.0  HGB 10.2* 9.3* 9.0* 9.1* 9.6*  HCT 31.9* 30.2* 28.0* 29.2* 31.1*  MCV 96.7 96.8 96.6 94.5 94.5  PLT 204 199 193 215 234   Blood Culture    Component Value Date/Time   SDES BLOOD BLOOD LEFT HAND 04/15/2023 2035   SPECREQUEST  04/15/2023 2035    BOTTLES DRAWN AEROBIC AND ANAEROBIC Blood Culture adequate volume   CULT  04/15/2023 2035    NO GROWTH 3 DAYS Performed at [facility], [address], [place], [place] [address]    REPTSTATUS PENDING 04/15/2023 2035    Cardiac Enzymes: No results for input(s): "CKTOTAL", "CKMB", "CKMBINDEX", "TROPONINI" in the last 168  hours. CBG: Recent Labs  Lab 04/12/23 1844 04/16/23 0459  GLUCAP 87 88   Iron Studies: No results for input(s): "IRON", "TIBC", "TRANSFERRIN", "FERRITIN" in the last 72 hours. @lablastinr3 @ Studies/Results: IR Removal Tun Cv Cath W/O FL  Result Date: 04/16/2023 INDICATION: 77 year old male with history of ESRD, tunneled dialysis catheter in place presents with bacteremia. Request for tunneled dialysis catheter removal for line holiday. EXAM: REMOVAL OF TUNNELED HEMODIALYSIS CATHETER MEDICATIONS: None COMPLICATIONS: None immediate. PROCEDURE: Informed written consent was obtained from the patient following an explanation of the procedure, risks, benefits and alternatives to treatment. A time out was performed prior to the initiation of the procedure. Sterile technique was utilized including mask, sterile gloves, sterile drape, and hand hygiene. ChloraPrep was used to prep the patient's right neck, chest and existing catheter. The catheter was removed intact without difficulty by applying gentle manual traction. Hemostasis was obtained with manual compression. A dressing was placed. The patient tolerated the procedure well without immediate post procedural complication. IMPRESSION: Successful removal of tunneled dialysis catheter. Performed by: Lawernce Ion, PA-C Electronically Signed   By: Corlis Leak M.D.   On: 04/16/2023 16:57   Medications:  cefTAZidime (FORTAZ)  IV     Followed by   cefTAZidime (FORTAZ)  IV      amiodarone  200  mg Oral Daily   apixaban  2.5 mg Oral BID   atorvastatin  40 mg Oral QHS   calcitRIOL  0.25 mcg Oral Daily   Chlorhexidine Gluconate Cloth  6 each Topical Daily   Chlorhexidine Gluconate Cloth  6 each Topical Q0600   cinacalcet  30 mg Oral Q supper   digoxin  0.0625 mg Oral QODAY   midodrine  20 mg Oral TID WC   pregabalin  75 mg Oral BID   sevelamer carbonate  800 mg Oral TID WC    Outpt Dialysis Orders: [facility] M[facility]  3h  450/1.5    82kg  2/2.5 bath  TDC    Heparin none - last OP HD 9/27, post wt 82.3kg   CXR 9/30 - no active disease  Assessment/Plan: GNR bacteremia / acute L hip osteomyelitis/ hip pain - On IV rocephin and po flagyl.  ID planning 6 week total abx course. Pt has TDC in place. Ordering f/u surveillance blood cx's today. ID has requested line holiday, IR removed TDC on 10/4, tentatively plan to replace on Monday, 10/7.  Afib / RVR - per pmd ESRD - on HD MWF. Continue MWF schedule, see above.  BP/ chronic hypotension - takes midodrine 20 tid at home, continued here. BP's stable. Volume - no edema per CXR, euvolemic on exam. At his dry wt but likely lost some body weight.  Anemia esrd - Hb 9.6, improving. Will start aranesp with next HD.  MBD ckd - CCa and phos controlled. Cont sensipar, po vdra and renvela as binder.   Rogers Blocker, PA-C 04/18/2023, 9:28 AM  [facility] Pager: [phone]

## 2023-04-18 NOTE — Plan of Care (Signed)
  Problem: Activity: Goal: Ability to tolerate increased activity will improve Outcome: Progressing   Problem: Respiratory: Goal: Ability to maintain a clear airway and adequate ventilation will improve Outcome: Progressing   Problem: Role Relationship: Goal: Method of communication will improve Outcome: Progressing   

## 2023-04-18 NOTE — Consult Note (Signed)
WOC Nurse Consult Note: Reason for Consult: sepsis resulting from Left groin ischial abscess, chronic Bilateral unstageable pressure injuries to heels.   Wound type: pressure injury to heels, eschar in wound bed Pressure Injury POA: Yes Measurement: 5 cm x 4 cm eschar Wound bed: devitalized tissue, eschar Drainage (amount, consistency, odor) minimal serosanguinous  necrotic odor.  Periwound: peeling epithelium around eschar Dressing procedure/placement/frequency: Cleanse bilateral heels with VASHE cleanser.  Apply VASHE moist gauze to wound bed.  Cover with dry gauze and heel foam dressings.  Change VASHE gauze daily and foam dressing every other day.  Prevalon boots Will not follow at this time.  Please re-consult if needed.  Mike Gip MSN, RN, FNP-BC CWON Wound, Ostomy, Continence Nurse Outpatient [facility] [phone] Pager [phone]

## 2023-04-18 NOTE — Progress Notes (Signed)
PROGRESS NOTE    Justin [name]  OZD:[id number] DOB: [birth year] DOA: 04/12/2023 PCP: Pcp, No   Brief Narrative:   77 year old with history of ESRD on HD, GIB, rectal abscess, cholecystitis with percutaneous drain, diabetes mellitus type 2 diet-controlled, chronic hypotension on midodrine, ambulatory dysfunction/wheelchair-bound for the last 3-4 months was admitted to the hospital last month for ESRD and septic shock.  Patient was noted to have 3.5 cm ischial abscess drained by IR, cultures were negative completed antibiotic course in the hospital and was discharged on September 26.  Patient now presented back to the hospital again with persistent left groin pain  IR reviewed images, no obvious area requiring drainage.  Currently on empiric antibiotics. ID on board.   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 Leukocytosis. At baseline he has been nonambulatory for the past several months.  CT scan of the left hip again shows small fluid collection but after reviewing by IR it is not sufficient for any sort of aspiration therefore recommending IV antibiotics.  ID on board and recommend dialysis catheter holiday.  Repeat blood culture from 04/15/2023 negative in 2 days. \ Currently on Fortaz.  Plan for 6 weeks of antibiotic.    Paroxysmal atrial fibrillation with RVR (HCC) Continue amiodarone, digoxin, Eliquis.  Rate controlled at this time.   ESRD Monday Wednesday Friday  hemodialysis  Continue midodrine for chronic hypotension.  Nephrology on board for hemodialysis needs.  Blood pressure stable.  TDC was removed 04/16/23 for line  holiday through the weekend.   History of cholecystitis Status post percutaneous cholecystostomy tube in place changed to regularly at [facility].  Not a surgical candidate as per previous evaluation   Chronic hypotension.  Takes midodrine at home.  Continue while in the hospital.  history of GI bleed Anemia of chronic kidney disease No current evidence.  On Eliquis.  Deconditioning, debility.  PT OT has seen the patient and recommend skilled nursing facility placement.  Has a wheelchair at baseline.  Pressure injury.  Present on admission.  Continue wound care.  Pressure Injury 12/14/22 Heel Left;Posterior Unstageable - Full thickness tissue loss in which the base of the injury is covered by slough (yellow, tan, gray, green or brown) and/or eschar (tan, brown or black) in the wound bed. (Active)  12/14/22 0700  Location: Heel  Location Orientation: Left;Posterior  Staging: Unstageable - Full thickness tissue loss in which the base of the injury is covered by slough (yellow, tan, gray, green or brown) and/or eschar (tan, brown or black) in the wound bed.  Wound Description (Comments):   Present on Admission: Yes     Pressure Injury 12/14/22 Heel Right Unstageable - Full thickness tissue loss in which the base of the injury is covered by slough (yellow, tan, gray, green or brown) and/or eschar (tan, brown or black) in the wound bed. (Active)  12/14/22 0700  Location: Heel  Location Orientation: Right  Staging: Unstageable - Full thickness tissue loss in which the base of the injury is covered by slough (yellow, tan, gray, green or brown) and/or eschar (tan, brown or black) in the wound bed.  Wound Description (  Comments):   Present on Admission: Yes    DVT prophylaxis: Eliquis  Consults performed.   Interventional radiology Infectious disease Nephrology.  Procedures. Hemodialysis TDC removal on 04/16/2023  Code Status: DNR  Family Communication:   None at  bedside.  Subjective: Patient was seen and examined bedside.  Denies any pain, nausea, vomiting, fever, chills or rigor.  Has been eating food.  Inquiring about catheter tomorrow.   Physical examination: Body mass index is 25.94 kg/m.   General:  Average built, not in obvious distress HENT:   No scleral pallor or icterus noted. Oral mucosa is moist.  Chest:   Diminished breath sounds bilaterally. No crackles or wheezes.   CVS: S1 &S2 heard. No murmur.  Regular rate and rhythm. Abdomen: Soft, nontender, nondistended.  Bowel sounds are heard.  Right upper quadrant cholecystostomy drain in place.  Left hip tenderness on palpation. Extremities: No cyanosis, clubbing or edema.  Left lower extremity tenderness on palpation  psych: Alert, awake and oriented, normal mood CNS:  No cranial nerve deficits.  Power equal in all extremities.   Skin: Warm and dry.  Pressure injury of the left and right heel   Objective: Vitals:   04/17/23 1615 04/17/23 2023 04/18/23 0504 04/18/23 0747  BP: 118/61 121/75 116/71 119/64  Pulse: (!) 56 60 67 69  Resp: 16 18 18 17   Temp: 97.6 F (36.4 C) 98.3 F (36.8 C) 98.2 F (36.8 C) 97.8 F (36.6 C)  TempSrc: Oral Oral Oral Oral  SpO2: 100% 97% 99% 100%  Weight:      Height:       No intake or output data in the 24 hours ending 04/18/23 0912  Filed Weights   04/16/23 0847 04/16/23 1244 04/17/23 0509  Weight: 77.8 kg 79.6 kg 82 kg    Scheduled Meds:  amiodarone  200 mg Oral Daily   apixaban  2.5 mg Oral BID   atorvastatin  40 mg Oral QHS   calcitRIOL  0.25 mcg Oral Daily   Chlorhexidine Gluconate Cloth  6 each Topical Daily   Chlorhexidine Gluconate Cloth  6 each Topical Q0600   cinacalcet  30 mg Oral Q supper   digoxin  0.0625 mg Oral QODAY   midodrine  20 mg Oral TID WC   pregabalin  75 mg Oral BID   sevelamer carbonate  800 mg Oral TID WC   Continuous Infusions:  cefTAZidime (FORTAZ)  IV     Followed by   cefTAZidime (FORTAZ)  IV        Data Reviewed:   CBC: Recent Labs  Lab 04/12/23 1837 04/13/23 0601 04/14/23 1151 04/16/23 0616 04/18/23 0614  WBC 16.9* 14.9* 7.2 6.3 8.0  HGB 10.2* 9.3* 9.0* 9.1* 9.6*  HCT 31.9* 30.2* 28.0* 29.2* 31.1*  MCV 96.7 96.8 96.6 94.5 94.5  PLT 204 199 193 215 234   Basic Metabolic Panel: Recent Labs  Lab 04/12/23 1837 04/13/23 0601 04/14/23 1151 04/16/23 0616 04/18/23 0614  NA 139 139 140 134* 134*  K 4.2 4.0 3.6 3.4* 4.0  CL 100 103 100 94* 95*  CO2 23 22 22 26 25   GLUCOSE 89 72 107* 96 79  BUN 45* 51* 63* 35* 25*  CREATININE 8.57* 8.94* 10.31* 7.68* 7.01*  CALCIUM 9.1 8.8* 8.5* 8.2* 8.0*  MG  --   --   --  1.9 1.8  PHOS  --   --  5.4*  --   --    GFR: Estimated Creatinine Clearance: 8.8  mL/min (A) (by C-G formula based on SCr of 7.01 mg/dL (H)). Liver Function Tests: Recent Labs  Lab 04/14/23 1151  ALBUMIN 2.1*   No results for input(s): "LIPASE", "AMYLASE" in the last 168 hours. No results for input(s): "AMMONIA" in the last 168 hours. Coagulation Profile: No results for input(s): "INR", "PROTIME" in the last 168 hours. Cardiac Enzymes: No results for input(s): "CKTOTAL", "CKMB", "CKMBINDEX", "TROPONINI" in the last 168 hours. BNP (last 3 results) No results for input(s): "PROBNP" in the last 8760 hours. HbA1C: No results for input(s): "HGBA1C" in the last 72 hours. CBG: Recent Labs  Lab 04/12/23 1844 04/16/23 0459  GLUCAP 87 88   Lipid Profile: No results for input(s): "CHOL", "HDL", "LDLCALC", "TRIG", "CHOLHDL", "LDLDIRECT" in the last 72 hours. Thyroid Function Tests: No results for input(s): "TSH", "T4TOTAL", "FREET4", "T3FREE", "THYROIDAB" in the last 72 hours. Anemia Panel: No results for input(s): "VITAMINB12", "FOLATE", "FERRITIN", "TIBC", "IRON", "RETICCTPCT" in the last 72 hours. Sepsis Labs: Recent Labs  Lab 04/13/23 0601  PROCALCITON 7.08    Recent Results (from the past 240 hour(s))  Blood culture (routine x 2)     Status:  Abnormal   Collection Time: 04/12/23  7:08 PM   Specimen: BLOOD RIGHT HAND  Result Value Ref Range Status   Specimen Description BLOOD RIGHT HAND  Final   Special Requests   Final    BOTTLES DRAWN AEROBIC AND ANAEROBIC Blood Culture adequate volume   Culture  Setup Time   Final    GRAM NEGATIVE RODS IN BOTH AEROBIC AND ANAEROBIC BOTTLES CRITICAL RESULT CALLED TO, READ BACK BY AND VERIFIED WITHCarmelia Bake PHARMD, AT 1041 04/13/23 Renato Shin Performed at [facility], [address] [place], [place] [address]    Culture KLEBSIELLA PNEUMONIAE (A)  Final   Report Status 04/16/2023 FINAL  Final   Organism ID, Bacteria KLEBSIELLA PNEUMONIAE  Final      Susceptibility   Klebsiella pneumoniae - MIC*    AMPICILLIN >=32 RESISTANT Resistant     CEFEPIME <=0.12 SENSITIVE Sensitive     CEFTAZIDIME <=1 SENSITIVE Sensitive     CEFTRIAXONE <=0.25 SENSITIVE Sensitive     CIPROFLOXACIN <=0.25 SENSITIVE Sensitive     GENTAMICIN <=1 SENSITIVE Sensitive     IMIPENEM <=0.25 SENSITIVE Sensitive     TRIMETH/SULFA <=20 SENSITIVE Sensitive     AMPICILLIN/SULBACTAM >=32 RESISTANT Resistant     PIP/TAZO >=128 RESISTANT Resistant     * KLEBSIELLA PNEUMONIAE  Blood Culture ID Panel (Reflexed)     Status: Abnormal   Collection Time: 04/12/23  7:08 PM  Result Value Ref Range Status   Enterococcus faecalis NOT DETECTED NOT DETECTED Final   Enterococcus Faecium NOT DETECTED NOT DETECTED Final   Listeria monocytogenes NOT DETECTED NOT DETECTED Final   Staphylococcus species NOT DETECTED NOT DETECTED Final   Staphylococcus aureus (BCID) NOT DETECTED NOT DETECTED Final   Staphylococcus epidermidis NOT DETECTED NOT DETECTED Final   Staphylococcus lugdunensis NOT DETECTED NOT DETECTED Final   Streptococcus species NOT DETECTED NOT DETECTED Final   Streptococcus agalactiae NOT DETECTED NOT DETECTED Final   Streptococcus pneumoniae NOT DETECTED NOT DETECTED Final   Streptococcus pyogenes NOT DETECTED NOT  DETECTED Final   A.calcoaceticus-baumannii NOT DETECTED NOT DETECTED Final   Bacteroides fragilis NOT DETECTED NOT DETECTED Final   Enterobacterales DETECTED (A) NOT DETECTED Final    Comment: Enterobacterales represent a large order of gram negative bacteria, not a single organism. CRITICAL RESULT CALLED TO, READ BACK BY  AND VERIFIED WITH: B. AGEE PHARMD, AT 1041 04/13/23 D. VANHOOK    Enterobacter cloacae complex NOT DETECTED NOT DETECTED Final   Escherichia coli NOT DETECTED NOT DETECTED Final   Klebsiella aerogenes NOT DETECTED NOT DETECTED Final   Klebsiella oxytoca NOT DETECTED NOT DETECTED Final   Klebsiella pneumoniae DETECTED (A) NOT DETECTED Final    Comment: CRITICAL RESULT CALLED TO, READ BACK BY AND VERIFIED WITH: B. AGEE PHARMD, AT 1041 04/13/23 D. VANHOOK    Proteus species NOT DETECTED NOT DETECTED Final   Salmonella species NOT DETECTED NOT DETECTED Final   Serratia marcescens NOT DETECTED NOT DETECTED Final   Haemophilus influenzae NOT DETECTED NOT DETECTED Final   Neisseria meningitidis NOT DETECTED NOT DETECTED Final   Pseudomonas aeruginosa NOT DETECTED NOT DETECTED Final   Stenotrophomonas maltophilia NOT DETECTED NOT DETECTED Final   Candida albicans NOT DETECTED NOT DETECTED Final   Candida auris NOT DETECTED NOT DETECTED Final   Candida glabrata NOT DETECTED NOT DETECTED Final   Candida krusei NOT DETECTED NOT DETECTED Final   Candida parapsilosis NOT DETECTED NOT DETECTED Final   Candida tropicalis NOT DETECTED NOT DETECTED Final   Cryptococcus neoformans/gattii NOT DETECTED NOT DETECTED Final   CTX-M ESBL NOT DETECTED NOT DETECTED Final   Carbapenem resistance IMP NOT DETECTED NOT DETECTED Final   Carbapenem resistance KPC NOT DETECTED NOT DETECTED Final   Carbapenem resistance NDM NOT DETECTED NOT DETECTED Final   Carbapenem resist OXA 48 LIKE NOT DETECTED NOT DETECTED Final   Carbapenem resistance VIM NOT DETECTED NOT DETECTED Final    Comment: Performed  at [facility], [address], [place], [place] [address]  Blood culture (routine x 2)     Status: Abnormal   Collection Time: 04/12/23  7:38 PM   Specimen: BLOOD  Result Value Ref Range Status   Specimen Description BLOOD SITE NOT SPECIFIED  Final   Special Requests   Final    BOTTLES DRAWN AEROBIC AND ANAEROBIC Blood Culture adequate volume   Culture  Setup Time   Final    GRAM NEGATIVE RODS IN BOTH AEROBIC AND ANAEROBIC BOTTLES CRITICAL RESULT CALLED TO, READ BACK BY AND VERIFIED WITH: B. AGEE PHARMD, AT 1041 04/13/23 D. VANHOOK    Culture (A)  Final    KLEBSIELLA PNEUMONIAE SUSCEPTIBILITIES PERFORMED ON PREVIOUS CULTURE WITHIN THE LAST 5 DAYS. Performed at [facility], [address], [place], [place] [address]    Report Status 04/16/2023 FINAL  Final  Culture, blood (Routine X 2) w Reflex to ID Panel     Status: None (Preliminary result)   Collection Time: 04/15/23  8:32 PM   Specimen: BLOOD  Result Value Ref Range Status   Specimen Description BLOOD BLOOD LEFT ARM  Final   Special Requests   Final    BOTTLES DRAWN AEROBIC AND ANAEROBIC Blood Culture adequate volume   Culture   Final    NO GROWTH 3 DAYS Performed at [facility], [address] [place], [place] [address]    Report Status PENDING  Incomplete  Culture, blood (Routine X 2) w Reflex to ID Panel     Status: None (Preliminary result)   Collection Time: 04/15/23  8:35 PM   Specimen: BLOOD  Result Value Ref Range Status   Specimen Description BLOOD BLOOD LEFT HAND  Final   Special Requests   Final    BOTTLES DRAWN AEROBIC AND ANAEROBIC Blood Culture adequate volume   Culture   Final  NO GROWTH 3 DAYS Performed at [facility], [address], [place], [place] [address]    Report Status PENDING  Incomplete       Radiology Studies: IR Removal Tun Cv Cath W/O FL  Result Date: 04/16/2023 INDICATION: 77 year old male with history of ESRD, tunneled dialysis catheter in place  presents with bacteremia. Request for tunneled dialysis catheter removal for line holiday. EXAM: REMOVAL OF TUNNELED HEMODIALYSIS CATHETER MEDICATIONS: None COMPLICATIONS: None immediate. PROCEDURE: Informed written consent was obtained from the patient following an explanation of the procedure, risks, benefits and alternatives to treatment. A time out was performed prior to the initiation of the procedure. Sterile technique was utilized including mask, sterile gloves, sterile drape, and hand hygiene. ChloraPrep was used to prep the patient's right neck, chest and existing catheter. The catheter was removed intact without difficulty by applying gentle manual traction. Hemostasis was obtained with manual compression. A dressing was placed. The patient tolerated the procedure well without immediate post procedural complication. IMPRESSION: Successful removal of tunneled dialysis catheter. Performed by: Lawernce Ion, PA-C Electronically Signed   By: Corlis Leak M.D.   On: 04/16/2023 16:57      LOS: 5 days   Joycelyn Das, MD [facility] If 7PM-7AM, please contact night-coverage  04/18/2023, 9:12 AM

## 2023-04-19 ENCOUNTER — Inpatient Hospital Stay (HOSPITAL_COMMUNITY): Payer: Medicare Other

## 2023-04-19 DIAGNOSIS — R6510 Systemic inflammatory response syndrome (SIRS) of non-infectious origin without acute organ dysfunction: Secondary | ICD-10-CM | POA: Diagnosis not present

## 2023-04-19 DIAGNOSIS — M25552 Pain in left hip: Secondary | ICD-10-CM | POA: Diagnosis not present

## 2023-04-19 DIAGNOSIS — I480 Paroxysmal atrial fibrillation: Secondary | ICD-10-CM | POA: Diagnosis not present

## 2023-04-19 DIAGNOSIS — N186 End stage renal disease: Secondary | ICD-10-CM | POA: Diagnosis not present

## 2023-04-19 HISTORY — PX: IR US GUIDE VASC ACCESS RIGHT: IMG2390

## 2023-04-19 HISTORY — PX: IR VENO/JUGULAR RIGHT: IMG2275

## 2023-04-19 HISTORY — PX: IR FLUORO GUIDE CV LINE LEFT: IMG2282

## 2023-04-19 LAB — GLUCOSE, CAPILLARY: Glucose-Capillary: 83 mg/dL (ref 70–99)

## 2023-04-19 MED ORDER — HEPARIN SODIUM (PORCINE) 1000 UNIT/ML IJ SOLN
INTRAMUSCULAR | Status: AC
  Filled 2023-04-19: qty 10

## 2023-04-19 MED ORDER — LIDOCAINE-EPINEPHRINE 1 %-1:100000 IJ SOLN
INTRAMUSCULAR | Status: AC
  Filled 2023-04-19: qty 1

## 2023-04-19 MED ORDER — DARBEPOETIN ALFA 60 MCG/0.3ML IJ SOSY
60.0000 ug | PREFILLED_SYRINGE | INTRAMUSCULAR | Status: DC
  Administered 2023-04-19: 60 ug via SUBCUTANEOUS
  Filled 2023-04-19: qty 0.3

## 2023-04-19 MED ORDER — IOHEXOL 300 MG/ML  SOLN
50.0000 mL | Freq: Once | INTRAMUSCULAR | Status: AC | PRN
  Administered 2023-04-19: 5 mL via INTRAVENOUS

## 2023-04-19 MED ORDER — MIDAZOLAM HCL 2 MG/2ML IJ SOLN
INTRAMUSCULAR | Status: AC | PRN
Start: 2023-04-19 — End: 2023-04-19
  Administered 2023-04-19: 1 mg via INTRAVENOUS

## 2023-04-19 MED ORDER — SODIUM CHLORIDE 0.9 % IV SOLN
1.0000 g | INTRAVENOUS | Status: DC
  Administered 2023-04-19: 1 g via INTRAVENOUS
  Filled 2023-04-19 (×2): qty 1

## 2023-04-19 MED ORDER — FENTANYL CITRATE (PF) 100 MCG/2ML IJ SOLN
INTRAMUSCULAR | Status: AC | PRN
Start: 2023-04-19 — End: 2023-04-19
  Administered 2023-04-19: 50 ug via INTRAVENOUS

## 2023-04-19 MED ORDER — MIDAZOLAM HCL 2 MG/2ML IJ SOLN
INTRAMUSCULAR | Status: AC
  Filled 2023-04-19: qty 2

## 2023-04-19 MED ORDER — HEPARIN SODIUM (PORCINE) 1000 UNIT/ML IJ SOLN
4.2000 mL | Freq: Once | INTRAMUSCULAR | Status: DC
  Filled 2023-04-19: qty 5

## 2023-04-19 MED ORDER — FENTANYL CITRATE (PF) 100 MCG/2ML IJ SOLN
INTRAMUSCULAR | Status: AC
  Filled 2023-04-19: qty 2

## 2023-04-19 MED ORDER — DEXTROSE 5 % IV SOLN
0.5000 g | Freq: Once | INTRAVENOUS | Status: AC
  Administered 2023-04-19: 0.5 g via INTRAVENOUS
  Filled 2023-04-19 (×2): qty 0.5

## 2023-04-19 MED ORDER — LIDOCAINE HCL (PF) 1 % IJ SOLN
30.0000 mL | Freq: Once | INTRAMUSCULAR | Status: AC
  Administered 2023-04-19: 15 mL
  Filled 2023-04-19: qty 30

## 2023-04-19 NOTE — Plan of Care (Signed)

## 2023-04-19 NOTE — Progress Notes (Signed)
[facility] Progress Note   Subjective:   Patient seen and examined at bedside.  Plan for [facility] placement today following line holiday.  Denies CP, SOB, abdominal pain, dizziness, fatigue, fever, chills and n/v/d.    Objective Vitals:   04/18/23 2118 04/19/23 0513 04/19/23 1129 04/19/23 1130  BP: 130/68 127/70 127/74 (!) 143/75  Pulse: (!) 55 (!) 57 73 75  Resp: 18 18 18 18   Temp:  98.2 F (36.8 C)    TempSrc:  Oral    SpO2: 100% 100% 98% 100%  Weight:      Height:       Physical Exam General:alert, elderly male in NAD Heart:RRR, no mrg Lungs:CTAB, nml WOB on RA Abdomen:soft, NTND Extremities:no LE edema Dialysis Access: none   Filed Weights   04/16/23 0847 04/16/23 1244 04/17/23 0509  Weight: 77.8 kg 79.6 kg 82 kg   No intake or output data in the 24 hours ending 04/19/23 1134  Additional Objective Labs: Basic Metabolic Panel: Recent Labs  Lab 04/14/23 1151 04/16/23 0616 04/18/23 0614  NA 140 134* 134*  K 3.6 3.4* 4.0  CL 100 94* 95*  CO2 22 26 25   GLUCOSE 107* 96 79  BUN 63* 35* 25*  CREATININE 10.31* 7.68* 7.01*  CALCIUM 8.5* 8.2* 8.0*  PHOS 5.4*  --   --    Liver Function Tests: Recent Labs  Lab 04/14/23 1151  ALBUMIN 2.1*    CBC: Recent Labs  Lab 04/12/23 1837 04/13/23 0601 04/14/23 1151 04/16/23 0616 04/18/23 0614  WBC 16.9* 14.9* 7.2 6.3 8.0  HGB 10.2* 9.3* 9.0* 9.1* 9.6*  HCT 31.9* 30.2* 28.0* 29.2* 31.1*  MCV 96.7 96.8 96.6 94.5 94.5  PLT 204 199 193 215 234   Blood Culture    Component Value Date/Time   SDES BLOOD BLOOD LEFT HAND 04/15/2023 2035   SPECREQUEST  04/15/2023 2035    BOTTLES DRAWN AEROBIC AND ANAEROBIC Blood Culture adequate volume   CULT  04/15/2023 2035    NO GROWTH 4 DAYS Performed at [facility], [address] [place], [place] [address]    REPTSTATUS PENDING 04/15/2023 2035    Medications:  cefTAZidime (FORTAZ)  IV     cefTAZidime (FORTAZ)  IV      amiodarone  200 mg Oral Daily    apixaban  2.5 mg Oral BID   atorvastatin  40 mg Oral QHS   calcitRIOL  0.25 mcg Oral Daily   Chlorhexidine Gluconate Cloth  6 each Topical Daily   Chlorhexidine Gluconate Cloth  6 each Topical Q0600   Chlorhexidine Gluconate Cloth  6 each Topical Q0600   cinacalcet  30 mg Oral Q supper   digoxin  0.0625 mg Oral QODAY   midodrine  20 mg Oral TID WC   pregabalin  75 mg Oral Daily   sevelamer carbonate  800 mg Oral TID WC    Dialysis Orders: [facility] M[facility]  3h  450/1.5    82kg  2/2.5 bath  TDC   Heparin none - last OP HD 9/27, post wt 82.3kg   CXR 9/30 - no active disease   Assessment/Plan: GNR bacteremia / acute L hip osteomyelitis/ hip pain - On IV rocephin and po flagyl.  ID planning 6 week total abx course. Pt has TDC in place. Ordering f/u surveillance blood cx's today. ID has requested line holiday, IR removed TDC on 10/4, plan to replace today.  Appreciate IR assistance.  Afib / RVR - per pmd ESRD -  on HD MWF. HD today per regular schedule once [facility] placed.  BP/ chronic hypotension - takes midodrine 20 tid at home, continued here. BP's stable. Volume - no edema per CXR, euvolemic on exam. At his dry wt but likely lost some body weight. UF as tolerated.  Anemia esrd - Hb 9.6, improving. Start aranesp q2wks.   MBD ckd - CCa and phos controlled. Cont sensipar, po vdra and renvela as binder.  Nutrition - renal diet w/fluid restrictions when no longer NPO.   Virgina Norfolk, PA-C [facility] 04/19/2023,11:34 AM  LOS: 6 days

## 2023-04-19 NOTE — Progress Notes (Signed)
PT Cancellation Note  Patient Details Name: Justin [name] MRN: [id number] DOB: 05-29-1946   Cancelled Treatment:    Reason Eval/Treat Not Completed: Fatigue/lethargy limiting ability to participate; patient declined due to fatigue post procedure for HD access and awaiting transport to dialysis.  Will follow up another day.   [name] 04/19/2023, 5:31 PM Sheran Lawless, PT Acute Rehabilitation Services [phone] 04/19/2023

## 2023-04-19 NOTE — Procedures (Signed)
Interventional Radiology Procedure Note  Procedure:  1) Ultrasound guided vascular access of right internal jugular vein 2) Right innominate venogram 3) Ultrasound guided vascular access of left external jugular vein 4) Tunneled hemodialysis catheter placement  Findings: Please refer to procedural dictation for full description. Left external jugular tunneled hemodialysis catheter placement, 28 cm.  Catheter tip in right atrium.  Right internal jugular and left internal jugular are occluded.   Complications: None immediate  Estimated Blood Loss: < 5 ml  Recommendations: Catheter ready for immediate use.   Marliss Coots, MD

## 2023-04-19 NOTE — TOC Progression Note (Signed)
Transition of Care [facility]) - Progression Note    Patient Details  Name: Justin [name] MRN: [id number] Date of Birth: 04/08/1946  Transition of Care [facility]) CM/SW Contact  Hani Campusano A [place], [place] Phone Number: 04/19/2023, 10:47 AM  Clinical Narrative:     Pt's auth approved for [facility].   Reference ID [id number] Auth ID [id number]  Approval Dates 04/16/2023-04/20/2023  Pt to DC once medically stable.   TOC will continue to follow.   Expected Discharge Plan: Skilled Nursing Facility Barriers to Discharge: Continued Medical Work up, English as a second language teacher, SNF Pending bed offer  Expected Discharge Plan and Services In-house Referral: Clinical Social Work   Post Acute Care Choice: Skilled Nursing Facility Living arrangements for the past 2 months: Single Family Home                                       Social Determinants of Health (SDOH) Interventions SDOH Screenings   Food Insecurity: Food Insecurity Present (04/13/2023)  Housing: Low Risk  (04/13/2023)  Transportation Needs: Unmet Transportation Needs (04/13/2023)  Utilities: Not At Risk (04/13/2023)  Social Connections: Unknown (10/13/2022)   Received from [facility], [facility]  Stress: No Stress Concern Present (10/14/2022)   Received from [facility], [facility]  Tobacco Use: Medium Risk (04/16/2023)    Readmission Risk Interventions    03/30/2023    2:29 PM 11/14/2022   10:08 AM 09/18/2022    4:10 PM  Readmission Risk Prevention Plan  Transportation Screening Complete Complete Complete  Medication Review (RN Care Manager) Referral to Pharmacy Complete Complete  PCP or Specialist appointment within 3-5 days of discharge Complete Complete Complete  HRI or Home Care Consult Complete Complete Complete  SW Recovery Care/Counseling Consult Complete Complete   Palliative Care Screening Not Applicable Not Applicable Complete  Skilled Nursing Facility Not Applicable Complete Not Applicable

## 2023-04-19 NOTE — Progress Notes (Signed)
PROGRESS NOTE    ELKAN LESSMAN  WUJ:[id number] DOB: Feb 06, 1946 DOA: 04/12/2023 PCP: Pcp, No   Brief Narrative:   77 year old with history of ESRD on HD, GIB, rectal abscess, cholecystitis with percutaneous drain, diabetes mellitus type 2 diet-controlled, chronic hypotension on midodrine, ambulatory dysfunction/wheelchair-bound for the last 3-4 months was admitted to the hospital last month for ESRD and septic shock.  Patient was noted to have 3.5 cm ischial abscess drained by IR, cultures were negative completed antibiotic course in the hospital and was discharged on September 26.  Patient now presented back to the hospital again with persistent left groin pain  IR reviewed images, no obvious area requiring drainage.  Currently on empiric antibiotics. ID on board.  Patient underwent line holiday on 04/16/2023 due to bacteremia.  Plan for [facility] catheter placement 04/19/2023.   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 Leukocytosis. At baseline he has been nonambulatory for the past several months.  CT scan of the left hip with small fluid collection but after reviewing by IR it is not sufficient for any sort of aspiration therefore recommending IV antibiotics.  ID on board and recommend dialysis catheter holiday.  Repeat blood culture from 04/15/2023 negative in 4 days.  Currently on Fortaz.  Plan for 6 weeks of antibiotic.    Paroxysmal atrial fibrillation with RVR (HCC) Continue amiodarone, digoxin, Eliquis.  Rate controlled at this time.   ESRD Monday Wednesday Friday  hemodialysis  Continue midodrine for chronic hypotension.   Nephrology on board for hemodialysis needs.  Blood pressure stable.  TDC was removed 04/16/23 for line holiday through the weekend.  Plan for new hemodialysis catheter today.   History of cholecystitis Status post percutaneous cholecystostomy tube in place changed to regularly at [facility].  Not a surgical candidate as per previous evaluation   Chronic hypotension.  Takes midodrine at home.  Continue while in the hospital.  Blood pressure seems to be stable at this time.  history of GI bleed Anemia of chronic kidney disease No current evidence.  On Eliquis.  Deconditioning, debility.  PT OT has seen the patient and recommend skilled nursing facility placement.  Has a wheelchair at baseline.  Pressure injury.  Present on admission.  Continue wound care.  Pressure Injury 12/14/22 Heel Left;Posterior Unstageable - Full thickness tissue loss in which the base of the injury is covered by slough (yellow, tan, gray, green or brown) and/or eschar (tan, brown or black) in the wound bed. (Active)  12/14/22 0700  Location: Heel  Location Orientation: Left;Posterior  Staging: Unstageable - Full thickness tissue loss in which the base of the injury is covered by slough (yellow, tan, gray, green or brown) and/or eschar (tan, brown or black) in the wound bed.  Wound Description (Comments):   Present on Admission: Yes     Pressure Injury 12/14/22 Heel Right Unstageable - Full thickness tissue loss in which the base of the injury is covered by slough (yellow, tan, gray, green or brown) and/or eschar (tan, brown or black) in the wound bed. (Active)  12/14/22 0700  Location: Heel  Location Orientation: Right  Staging: Unstageable - Full  thickness tissue loss in which the base of the injury is covered by slough (yellow, tan, gray, green or brown) and/or eschar (tan, brown or black) in the wound bed.  Wound Description (Comments):   Present on Admission: Yes    DVT prophylaxis: Eliquis  Consults performed.    Interventional radiology Infectious disease Nephrology.  Procedures. Hemodialysis TDC removal on 04/16/2023  Code Status: DNR  Family Communication:   None at bedside.  Subjective: Patient was seen and examined bedside.  Appears slightly sleepy.  Denies any nausea vomiting fever chills pain.   Physical examination: Body mass index is 25.94 kg/m.   General:  Average built, not in obvious distress HENT:   No scleral pallor or icterus noted. Oral mucosa is moist.  Chest:   Diminished breath sounds bilaterally. No crackles or wheezes.   CVS: S1 &S2 heard. No murmur.  Regular rate and rhythm. Abdomen: Soft, nontender, nondistended.  Bowel sounds are heard.  Right upper quadrant cholecystostomy drain in place.  Left hip tenderness on palpation. Extremities: No cyanosis, clubbing or edema.  Left lower extremity tenderness on palpation  psych: Alert, awake and oriented, normal mood CNS:  No cranial nerve deficits.  Power equal in all extremities.   Skin: Warm and dry.  Pressure injury of the left and right heel   Objective: Vitals:   04/18/23 0747 04/18/23 1139 04/18/23 2118 04/19/23 0513  BP: 119/64 (!) 107/58 130/68 127/70  Pulse: 69 78 (!) 55 (!) 57  Resp: 17 17 18 18   Temp: 97.8 F (36.6 C) (!) 97.5 F (36.4 C)  98.2 F (36.8 C)  TempSrc: Oral Oral  Oral  SpO2: 100% 97% 100% 100%  Weight:      Height:       No intake or output data in the 24 hours ending 04/19/23 0951  Filed Weights   04/16/23 0847 04/16/23 1244 04/17/23 0509  Weight: 77.8 kg 79.6 kg 82 kg    Scheduled Meds:  amiodarone  200 mg Oral Daily   apixaban  2.5 mg Oral BID   atorvastatin  40 mg Oral QHS   calcitRIOL  0.25 mcg Oral Daily   Chlorhexidine Gluconate Cloth  6 each Topical Daily   Chlorhexidine Gluconate Cloth  6 each Topical Q0600   Chlorhexidine Gluconate Cloth  6 each Topical Q0600   cinacalcet  30 mg Oral Q supper   digoxin  0.0625 mg Oral QODAY   midodrine  20 mg Oral TID WC    pregabalin  75 mg Oral Daily   sevelamer carbonate  800 mg Oral TID WC   Continuous Infusions:  cefTAZidime (FORTAZ)  IV     cefTAZidime (FORTAZ)  IV       Data Reviewed:   CBC: Recent Labs  Lab 04/12/23 1837 04/13/23 0601 04/14/23 1151 04/16/23 0616 04/18/23 0614  WBC 16.9* 14.9* 7.2 6.3 8.0  HGB 10.2* 9.3* 9.0* 9.1* 9.6*  HCT 31.9* 30.2* 28.0* 29.2* 31.1*  MCV 96.7 96.8 96.6 94.5 94.5  PLT 204 199 193 215 234   Basic Metabolic Panel: Recent Labs  Lab 04/12/23 1837 04/13/23 0601 04/14/23 1151 04/16/23 0616 04/18/23 0614  NA 139 139 140 134* 134*  K 4.2 4.0 3.6 3.4* 4.0  CL 100 103 100 94* 95*  CO2 23 22 22 26 25   GLUCOSE 89 72 107* 96 79  BUN 45* 51* 63* 35* 25*  CREATININE 8.57* 8.94* 10.31* 7.68* 7.01*  CALCIUM 9.1 8.8* 8.5* 8.2* 8.0*  MG  --   --   --  1.9 1.8  PHOS  --   --  5.4*  --   --    GFR: Estimated Creatinine Clearance: 8.8 mL/min (A) (by C-G formula based on SCr of 7.01 mg/dL (H)). Liver Function Tests: Recent Labs  Lab 04/14/23 1151  ALBUMIN 2.1*   No results for input(s): "LIPASE", "AMYLASE" in the last 168 hours. No results for input(s): "AMMONIA" in the last 168 hours. Coagulation Profile: No results for input(s): "INR", "PROTIME" in the last 168 hours. Cardiac Enzymes: No results for input(s): "CKTOTAL", "CKMB", "CKMBINDEX", "TROPONINI" in the last 168 hours. BNP (last 3 results) No results for input(s): "PROBNP" in the last 8760 hours. HbA1C: No results for input(s): "HGBA1C" in the last 72 hours. CBG: Recent Labs  Lab 04/12/23 1844 04/16/23 0459 04/18/23 1140 04/18/23 1451 04/18/23 2119  GLUCAP 87 88 91 89 92   Lipid Profile: No results for input(s): "CHOL", "HDL", "LDLCALC", "TRIG", "CHOLHDL", "LDLDIRECT" in the last 72 hours. Thyroid Function Tests: No results for input(s): "TSH", "T4TOTAL", "FREET4", "T3FREE", "THYROIDAB" in the last 72 hours. Anemia Panel: No results for input(s): "VITAMINB12", "FOLATE", "FERRITIN",  "TIBC", "IRON", "RETICCTPCT" in the last 72 hours. Sepsis Labs: Recent Labs  Lab 04/13/23 0601  PROCALCITON 7.08    Recent Results (from the past 240 hour(s))  Blood culture (routine x 2)     Status: Abnormal   Collection Time: 04/12/23  7:08 PM   Specimen: BLOOD RIGHT HAND  Result Value Ref Range Status   Specimen Description BLOOD RIGHT HAND  Final   Special Requests   Final    BOTTLES DRAWN AEROBIC AND ANAEROBIC Blood Culture adequate volume   Culture  Setup Time   Final    GRAM NEGATIVE RODS IN BOTH AEROBIC AND ANAEROBIC BOTTLES CRITICAL RESULT CALLED TO, READ BACK BY AND VERIFIED WITHCarmelia Bake PHARMD, AT 1041 04/13/23 Renato Shin Performed at [facility], [address] [place], [place] [address]    Culture KLEBSIELLA PNEUMONIAE (A)  Final   Report Status 04/16/2023 FINAL  Final   Organism ID, Bacteria KLEBSIELLA PNEUMONIAE  Final      Susceptibility   Klebsiella pneumoniae - MIC*    AMPICILLIN >=32 RESISTANT Resistant     CEFEPIME <=0.12 SENSITIVE Sensitive     CEFTAZIDIME <=1 SENSITIVE Sensitive     CEFTRIAXONE <=0.25 SENSITIVE Sensitive     CIPROFLOXACIN <=0.25 SENSITIVE Sensitive     GENTAMICIN <=1 SENSITIVE Sensitive     IMIPENEM <=0.25 SENSITIVE Sensitive     TRIMETH/SULFA <=20 SENSITIVE Sensitive     AMPICILLIN/SULBACTAM >=32 RESISTANT Resistant     PIP/TAZO >=128 RESISTANT Resistant     * KLEBSIELLA PNEUMONIAE  Blood Culture ID Panel (Reflexed)     Status: Abnormal   Collection Time: 04/12/23  7:08 PM  Result Value Ref Range Status   Enterococcus faecalis NOT DETECTED NOT DETECTED Final   Enterococcus Faecium NOT DETECTED NOT DETECTED Final   Listeria monocytogenes NOT DETECTED NOT DETECTED Final   Staphylococcus species NOT DETECTED NOT DETECTED Final   Staphylococcus aureus (BCID) NOT DETECTED NOT DETECTED Final   Staphylococcus epidermidis NOT DETECTED NOT DETECTED Final   Staphylococcus lugdunensis NOT DETECTED NOT DETECTED Final   Streptococcus  species NOT DETECTED NOT DETECTED Final   Streptococcus agalactiae NOT DETECTED NOT DETECTED Final   Streptococcus pneumoniae NOT DETECTED NOT DETECTED Final   Streptococcus pyogenes NOT DETECTED NOT DETECTED Final   A.calcoaceticus-baumannii NOT DETECTED NOT DETECTED Final   Bacteroides fragilis NOT DETECTED NOT DETECTED  Final   Enterobacterales DETECTED (A) NOT DETECTED Final    Comment: Enterobacterales represent a large order of gram negative bacteria, not a single organism. CRITICAL RESULT CALLED TO, READ BACK BY AND VERIFIED WITH: B. AGEE PHARMD, AT 1041 04/13/23 D. VANHOOK    Enterobacter cloacae complex NOT DETECTED NOT DETECTED Final   Escherichia coli NOT DETECTED NOT DETECTED Final   Klebsiella aerogenes NOT DETECTED NOT DETECTED Final   Klebsiella oxytoca NOT DETECTED NOT DETECTED Final   Klebsiella pneumoniae DETECTED (A) NOT DETECTED Final    Comment: CRITICAL RESULT CALLED TO, READ BACK BY AND VERIFIED WITH: B. AGEE PHARMD, AT 1041 04/13/23 D. VANHOOK    Proteus species NOT DETECTED NOT DETECTED Final   Salmonella species NOT DETECTED NOT DETECTED Final   Serratia marcescens NOT DETECTED NOT DETECTED Final   Haemophilus influenzae NOT DETECTED NOT DETECTED Final   Neisseria meningitidis NOT DETECTED NOT DETECTED Final   Pseudomonas aeruginosa NOT DETECTED NOT DETECTED Final   Stenotrophomonas maltophilia NOT DETECTED NOT DETECTED Final   Candida albicans NOT DETECTED NOT DETECTED Final   Candida auris NOT DETECTED NOT DETECTED Final   Candida glabrata NOT DETECTED NOT DETECTED Final   Candida krusei NOT DETECTED NOT DETECTED Final   Candida parapsilosis NOT DETECTED NOT DETECTED Final   Candida tropicalis NOT DETECTED NOT DETECTED Final   Cryptococcus neoformans/gattii NOT DETECTED NOT DETECTED Final   CTX-M ESBL NOT DETECTED NOT DETECTED Final   Carbapenem resistance IMP NOT DETECTED NOT DETECTED Final   Carbapenem resistance KPC NOT DETECTED NOT DETECTED Final    Carbapenem resistance NDM NOT DETECTED NOT DETECTED Final   Carbapenem resist OXA 48 LIKE NOT DETECTED NOT DETECTED Final   Carbapenem resistance VIM NOT DETECTED NOT DETECTED Final    Comment: Performed at [facility], [address] [place], [place] [address]  Blood culture (routine x 2)     Status: Abnormal   Collection Time: 04/12/23  7:38 PM   Specimen: BLOOD  Result Value Ref Range Status   Specimen Description BLOOD SITE NOT SPECIFIED  Final   Special Requests   Final    BOTTLES DRAWN AEROBIC AND ANAEROBIC Blood Culture adequate volume   Culture  Setup Time   Final    GRAM NEGATIVE RODS IN BOTH AEROBIC AND ANAEROBIC BOTTLES CRITICAL RESULT CALLED TO, READ BACK BY AND VERIFIED WITH: B. AGEE PHARMD, AT 1041 04/13/23 D. VANHOOK    Culture (A)  Final    KLEBSIELLA PNEUMONIAE SUSCEPTIBILITIES PERFORMED ON PREVIOUS CULTURE WITHIN THE LAST 5 DAYS. Performed at [facility] Lab, [address] [place], [place] [address]    Report Status 04/16/2023 FINAL  Final  Culture, blood (Routine X 2) w Reflex to ID Panel     Status: None (Preliminary result)   Collection Time: 04/15/23  8:32 PM   Specimen: BLOOD  Result Value Ref Range Status   Specimen Description BLOOD BLOOD LEFT ARM  Final   Special Requests   Final    BOTTLES DRAWN AEROBIC AND ANAEROBIC Blood Culture adequate volume   Culture   Final    NO GROWTH 4 DAYS Performed at [facility], [address] [place], [place] [address]    Report Status PENDING  Incomplete  Culture, blood (Routine X 2) w Reflex to ID Panel     Status: None (Preliminary result)   Collection Time: 04/15/23  8:35 PM   Specimen: BLOOD  Result Value Ref Range Status  Specimen Description BLOOD BLOOD LEFT HAND  Final   Special Requests   Final    BOTTLES DRAWN AEROBIC AND ANAEROBIC Blood Culture adequate volume   Culture   Final    NO GROWTH 4 DAYS Performed at [facility] Lab, [address], [place], [place] [address]    Report  Status PENDING  Incomplete       Radiology Studies: No results found.    LOS: 6 days   Joycelyn Das, MD [facility] If 7PM-7AM, please contact night-coverage  04/19/2023, 9:51 AM

## 2023-04-19 NOTE — Progress Notes (Signed)
Pt's referral to [facility] is currently pending. Update provided to CSW. Will assist as needed.   Olivia Canter Renal Navigator [phone]

## 2023-04-19 NOTE — Sedation Documentation (Signed)
Unable to place catheter on the right side, prepping for left side placement.

## 2023-04-20 ENCOUNTER — Encounter (HOSPITAL_COMMUNITY): Payer: Self-pay

## 2023-04-20 DIAGNOSIS — N186 End stage renal disease: Secondary | ICD-10-CM | POA: Diagnosis not present

## 2023-04-20 DIAGNOSIS — M25552 Pain in left hip: Secondary | ICD-10-CM | POA: Diagnosis not present

## 2023-04-20 DIAGNOSIS — I480 Paroxysmal atrial fibrillation: Secondary | ICD-10-CM | POA: Diagnosis not present

## 2023-04-20 DIAGNOSIS — R6510 Systemic inflammatory response syndrome (SIRS) of non-infectious origin without acute organ dysfunction: Secondary | ICD-10-CM | POA: Diagnosis not present

## 2023-04-20 HISTORY — PX: IR US GUIDE VASC ACCESS LEFT: IMG2389

## 2023-04-20 LAB — CBC
HCT: 30.3 % — ABNORMAL LOW (ref 39.0–52.0)
Hemoglobin: 9.3 g/dL — ABNORMAL LOW (ref 13.0–17.0)
MCH: 28.8 pg (ref 26.0–34.0)
MCHC: 30.7 g/dL (ref 30.0–36.0)
MCV: 93.8 fL (ref 80.0–100.0)
Platelets: 241 10*3/uL (ref 150–400)
RBC: 3.23 MIL/uL — ABNORMAL LOW (ref 4.22–5.81)
RDW: 16 % — ABNORMAL HIGH (ref 11.5–15.5)
WBC: 7.2 10*3/uL (ref 4.0–10.5)
nRBC: 0 % (ref 0.0–0.2)

## 2023-04-20 LAB — CULTURE, BLOOD (ROUTINE X 2)
Culture: NO GROWTH
Culture: NO GROWTH
Special Requests: ADEQUATE
Special Requests: ADEQUATE

## 2023-04-20 LAB — GLUCOSE, CAPILLARY
Glucose-Capillary: 64 mg/dL — ABNORMAL LOW (ref 70–99)
Glucose-Capillary: 59 mg/dL — ABNORMAL LOW (ref 70–99)
Glucose-Capillary: 60 mg/dL — ABNORMAL LOW (ref 70–99)
Glucose-Capillary: 58 mg/dL — ABNORMAL LOW (ref 70–99)
Glucose-Capillary: 93 mg/dL (ref 70–99)
Glucose-Capillary: 125 mg/dL — ABNORMAL HIGH (ref 70–99)
Glucose-Capillary: 86 mg/dL (ref 70–99)

## 2023-04-20 LAB — RENAL FUNCTION PANEL
Albumin: 2 g/dL — ABNORMAL LOW (ref 3.5–5.0)
Anion gap: 14 (ref 5–15)
BUN: 37 mg/dL — ABNORMAL HIGH (ref 8–23)
CO2: 25 mmol/L (ref 22–32)
Calcium: 7.9 mg/dL — ABNORMAL LOW (ref 8.9–10.3)
Chloride: 95 mmol/L — ABNORMAL LOW (ref 98–111)
Creatinine, Ser: 8.96 mg/dL — ABNORMAL HIGH (ref 0.61–1.24)
GFR, Estimated: 6 mL/min — ABNORMAL LOW (ref >60)
Glucose, Bld: 73 mg/dL (ref 70–99)
Phosphorus: 5.2 mg/dL — ABNORMAL HIGH (ref 2.5–4.6)
Potassium: 4.1 mmol/L (ref 3.5–5.1)
Sodium: 134 mmol/L — ABNORMAL LOW (ref 135–145)

## 2023-04-20 MED ORDER — CHLORHEXIDINE GLUCONATE CLOTH 2 % EX PADS
6.0000 | MEDICATED_PAD | Freq: Every day | CUTANEOUS | Status: DC
  Administered 2023-04-21: 6 via TOPICAL

## 2023-04-20 MED ORDER — DEXTROSE 50 % IV SOLN
25.0000 mL | Freq: Once | INTRAVENOUS | Status: AC
  Administered 2023-04-20: 25 mL via INTRAVENOUS
  Filled 2023-04-20: qty 50

## 2023-04-20 NOTE — Progress Notes (Signed)
   04/20/23 1200  Vitals  Temp 97.9 F (36.6 C)  Pulse Rate (!) 59  Resp 19  BP (!) 95/48  SpO2 100 %  O2 Device Room Air  Weight 80.5 kg  Post Treatment  Dialyzer Clearance Lightly streaked  Hemodialysis Intake (mL) 0 mL  Liters Processed 67.8  Fluid Removed (mL) 500 mL  Tolerated HD Treatment Yes  Post-Hemodialysis Comments tx terminated 10 mins early due to hypotension asymptomatic recovered when given blood back   Received patient in bed to unit.  Alert and oriented.  Informed consent signed and in chart.   TX duration:2hrs 49 mins  Patient tolerated well.  Transported back to the room  Alert, without acute distress.  Hand-off given to patient's nurse.   Access used: [facility] Access issues: none  Total UF removed: Medication(s) given: none    [facility]y T [facility]

## 2023-04-20 NOTE — Plan of Care (Signed)
  Problem: Activity: Goal: Ability to tolerate increased activity will improve Outcome: Progressing   Problem: Respiratory: Goal: Ability to maintain a clear airway and adequate ventilation will improve Outcome: Progressing   Problem: Role Relationship: Goal: Method of communication will improve Outcome: Progressing   

## 2023-04-20 NOTE — TOC Progression Note (Signed)
Transition of Care [facility]) - Progression Note    Patient Details  Name: Justin [name] MRN: [id number] Date of Birth: 06-08-1946  [facility]) CM/SW Contact  Isaias Dowson A [place], [place] Phone Number: 04/20/2023, 3:08 PM  Clinical Narrative:     CSW contacted [name] and pt over the phone to discuss discharge updates. CSW informed them that pt would discharge once clinic appointment and times had been established with [facility]. CSW to assist with authorization extension if needed for estimated date of 10/9.   Pt stated that he wanted his niece to no longer be his durable power of attorney, CSW stated that pt reach out to lawyer that initiated the paperwork to assist.   TOC will continue to follow.   Expected Discharge Plan: Skilled Nursing Facility Barriers to Discharge: Continued Medical Work up, English as a second language teacher, SNF Pending bed offer  Expected Discharge Plan and Services In-house Referral: Clinical Social Work   Post Acute Care Choice: Skilled Nursing Facility Living arrangements for the past 2 months: Single Family Home                                       Social Determinants of Health (SDOH) Interventions SDOH Screenings   Food Insecurity: Food Insecurity Present (04/13/2023)  Housing: Low Risk  (04/13/2023)  Transportation Needs: Unmet Transportation Needs (04/13/2023)  Utilities: Not At Risk (04/13/2023)  Social Connections: Unknown (10/13/2022)   Received from [facility]  Stress: No Stress Concern Present (10/14/2022)   Received from [facility], [facility]  Tobacco Use: Medium Risk (04/16/2023)    Readmission Risk Interventions    03/30/2023    2:29 PM 11/14/2022   10:08 AM 09/18/2022    4:10 PM  Readmission Risk Prevention Plan  Transportation Screening Complete Complete Complete  Medication Review (RN Care Manager) Referral to Pharmacy Complete Complete  PCP or Specialist appointment within 3-5 days of  discharge Complete Complete Complete  HRI or Home Care Consult Complete Complete Complete  SW Recovery Care/Counseling Consult Complete Complete   Palliative Care Screening Not Applicable Not Applicable Complete  Skilled Nursing Facility Not Applicable Complete Not Applicable

## 2023-04-20 NOTE — Plan of Care (Signed)

## 2023-04-20 NOTE — Progress Notes (Addendum)
Contacted [facility] admissions and [facility] to request an update on pt's pending referral for [facility]. Also inquired of clinic if they have iv ceftazidime which pt will need with HD at d/c. Awaiting response. Case discussed with CSW this morning as well. Advised [facility] that pt is likely close to d/c. Will assist as needed.   Olivia Canter Renal Navigator [phone]  Addendum at 3:36 pm: Navigator has made multiple attempts to reach clinic staff today to inquire about pt's acceptance at clinic. Also contacted [facility] admissions to request assistance as well with obtaining info. Awaiting final acceptance and schedule.

## 2023-04-20 NOTE — Progress Notes (Addendum)
PROGRESS NOTE    Justin [name]  VQQ:[id number] DOB: 1946/07/05 DOA: 04/12/2023 PCP: Pcp, No   Brief Narrative:   77 year old with history of ESRD on HD, GIB, rectal abscess, cholecystitis with percutaneous drain, diabetes mellitus type 2 diet-controlled, chronic hypotension on midodrine, ambulatory dysfunction/wheelchair-bound for the last 3-4 months was admitted to the hospital last month for ESRD and septic shock.  Patient was noted to have 3.5 cm ischial abscess drained by IR, cultures were negative completed antibiotic course in the hospital and was discharged on September 26.  Patient now presented back to the hospital again with persistent left groin pain  IR reviewed images, no obvious area requiring drainage.  Currently on empiric antibiotics. ID on board.  Patient underwent line holiday on 04/16/2023 due to bacteremia. Status post repeat [facility] catheter placement 04/19/2023 after line holiday.  Plan for skilled nursing facility when okay with IV antibiotics and HD setup.   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 Leukocytosis. At baseline he has been nonambulatory for the past several months.  CT scan of the left hip with small fluid collection but after reviewing by IR it is not sufficient for any sort of aspiration therefore recommending IV antibiotics.  ID on board and recommend dialysis catheter holiday.  Repeat blood culture from 04/15/2023 negative in 5 days.  Currently on Fortaz.  Plan for 6 weeks of IV antibiotic as per infectious disease.  Hypoglycemia today.  Will give D50 pushes.  Patient received fruit juice.  Will  recheck.  Closely monitor.    Paroxysmal atrial fibrillation with RVR (HCC) Continue amiodarone, digoxin, Eliquis.  Rate controlled at this time.   ESRD Monday Wednesday Friday  hemodialysis  Continue midodrine for chronic hypotension.  Nephrology on board for hemodialysis needs.   TDC was removed 04/16/23 for line holiday through the weekend.  Status post  new hemodialysis catheter placement 04/19/2023.  Seen during hemodialysis today.   History of cholecystitis status post percutaneous cholecystostomy tube in place. Status post percutaneous cholecystostomy tube in place changed to regularly at [facility].  Not a surgical candidate as per previous evaluation, nursing staff reported some leakage.  Asked to see if he is getting adequately flushed and change the dressing and see if it soaks again.  If continues to drain might need IR evaluation.  Chronic hypotension.  Takes midodrine at home.  Continue while in the hospital.  Blood pressure seems to slightly low today.  history of GI bleed Anemia of chronic kidney disease No current evidence.  On Eliquis.  Deconditioning, debility.  PT OT has seen the patient and recommend skilled nursing facility placement.  Has a wheelchair at baseline.  Pressure injury.  Present on admission.  Continue wound care.  Pressure Injury 12/14/22 Heel Left;Posterior Unstageable - Full thickness tissue loss in which the base of the injury is covered by slough (yellow, tan, gray, green or brown) and/or eschar (tan, brown or black) in the wound bed. (Active)  12/14/22 0700  Location: Heel  Location Orientation: Left;Posterior  Staging: Unstageable - Full thickness tissue loss in which the base of the injury is covered by slough (yellow,  tan, gray, green or brown) and/or eschar (tan, brown or black) in the wound bed.  Wound Description (Comments):   Present on Admission: Yes     Pressure Injury 12/14/22 Heel Right Unstageable - Full thickness tissue loss in which the  base of the injury is covered by slough (yellow, tan, gray, green or brown) and/or eschar (tan, brown or black) in the wound bed. (Active)  12/14/22 0700  Location: Heel  Location Orientation: Right  Staging: Unstageable - Full thickness tissue loss in which the base of the injury is covered by slough (yellow, tan, gray, green or brown) and/or eschar (tan, brown or black) in the wound bed.  Wound Description (Comments):   Present on Admission: Yes   DVT prophylaxis: Eliquis  Disposition.  At this time patient is awaiting for skilled nursing facility.  Will need new hemodialysis set up on discharge.  Communicated with TOC about it.  Reports that patient is medically stable at this time.  Consults performed.   Interventional radiology Infectious disease Nephrology.  Procedures. Hemodialysis TDC removal on 04/16/2023 [facility] placement on 04/19/2023  Code Status: DNR  Family Communication:   Tried to reach out to the patient's daughter on the phone but was unable to reach her today.  Unable to leave voice messages.  Subjective: Patient was seen and examined at bedside.  Seen during hemodialysis.  Status post new dialysis catheter placement 04/19/2023.  Denies nausea vomiting fever chills or rigor.  Nursing staff reported that patient was hypoglycemic and had some leakage around the cholecystostomy drain area.  Physical examination: Body mass index is 25.59 kg/m.   General:  Average built, not in obvious distress HENT:   No scleral pallor or icterus noted. Oral mucosa is moist.  Chest:   Diminished breath sounds bilaterally. No crackles or wheezes.  Status post left internal jugular TDC catheter placement CVS: S1 &S2 heard. No murmur.  Regular rate and rhythm. Abdomen: Soft, nontender, nondistended.  Bowel sounds are heard.  Right upper quadrant cholecystostomy drain in place.  Left hip tenderness on palpation. Extremities: No cyanosis, clubbing  psych: Alert, awake and Communicative.   Normal mood CNS:  No cranial nerve deficits.  Moves all extremities. Skin: Warm and dry.  Pressure injury of the left and right heel   Objective: Vitals:   04/20/23 1000 04/20/23 1030 04/20/23 1100 04/20/23 1115  BP: (S) 98/64 (S) (!) 85/56 (S) (!) 76/38 (!) 106/93  Pulse: 74 82 63 62  Resp: 10 10 (!) 25 12  Temp:      TempSrc:      SpO2: 99% 98% 98% 99%  Weight:      Height:       No intake or output data in the 24 hours ending 04/20/23 1140  Filed Weights   04/16/23 1244 04/17/23 0509 04/20/23 0817  Weight: 79.6 kg 82 kg 80.9 kg    Scheduled Meds:  amiodarone  200 mg Oral Daily   apixaban  2.5 mg Oral BID   atorvastatin  40 mg Oral QHS   calcitRIOL  0.25 mcg Oral Daily   Chlorhexidine Gluconate Cloth  6 each Topical Daily   Chlorhexidine Gluconate Cloth  6 each Topical Q0600   Chlorhexidine Gluconate Cloth  6 each Topical Q0600   cinacalcet  30 mg Oral Q supper   darbepoetin (ARANESP) injection - NON-DIALYSIS  60 mcg Subcutaneous Q Mon-1800   digoxin  0.0625 mg Oral QODAY   heparin sodium (porcine)  4.2 mL Intravenous Once  midodrine  20 mg Oral TID WC   pregabalin  75 mg Oral Daily   sevelamer carbonate  800 mg Oral TID WC   Continuous Infusions:  cefTAZidime (FORTAZ)  IV 1 g (04/19/23 1830)     Data Reviewed:   CBC: Recent Labs  Lab 04/14/23 1151 04/16/23 0616 04/18/23 0614 04/20/23 0930  WBC 7.2 6.3 8.0 7.2  HGB 9.0* 9.1* 9.6* 9.3*  HCT 28.0* 29.2* 31.1* 30.3*  MCV 96.6 94.5 94.5 93.8  PLT 193 215 234 241   Basic Metabolic Panel: Recent Labs  Lab 04/14/23 1151 04/16/23 0616 04/18/23 0614 04/20/23 0930  NA 140 134* 134* 134*  K 3.6 3.4* 4.0 4.1  CL 100 94* 95* 95*  CO2 22 26 25 25   GLUCOSE 107* 96 79 73  BUN 63* 35* 25* 37*  CREATININE 10.31* 7.68* 7.01* 8.96*  CALCIUM 8.5* 8.2* 8.0* 7.9*  MG  --  1.9 1.8  --   PHOS 5.4*  --   --  5.2*   GFR: Estimated Creatinine Clearance: 6.9 mL/min (A) (by C-G formula based on SCr of 8.96 mg/dL  (H)). Liver Function Tests: Recent Labs  Lab 04/14/23 1151 04/20/23 0930  ALBUMIN 2.1* 2.0*   No results for input(s): "LIPASE", "AMYLASE" in the last 168 hours. No results for input(s): "AMMONIA" in the last 168 hours. Coagulation Profile: No results for input(s): "INR", "PROTIME" in the last 168 hours. Cardiac Enzymes: No results for input(s): "CKTOTAL", "CKMB", "CKMBINDEX", "TROPONINI" in the last 168 hours. BNP (last 3 results) No results for input(s): "PROBNP" in the last 8760 hours. HbA1C: No results for input(s): "HGBA1C" in the last 72 hours. CBG: Recent Labs  Lab 04/18/23 1140 04/18/23 1451 04/18/23 2119 04/19/23 2026 04/20/23 0732  GLUCAP 91 89 92 83 64*   Lipid Profile: No results for input(s): "CHOL", "HDL", "LDLCALC", "TRIG", "CHOLHDL", "LDLDIRECT" in the last 72 hours. Thyroid Function Tests: No results for input(s): "TSH", "T4TOTAL", "FREET4", "T3FREE", "THYROIDAB" in the last 72 hours. Anemia Panel: No results for input(s): "VITAMINB12", "FOLATE", "FERRITIN", "TIBC", "IRON", "RETICCTPCT" in the last 72 hours. Sepsis Labs: No results for input(s): "PROCALCITON", "LATICACIDVEN" in the last 168 hours.   Recent Results (from the past 240 hour(s))  Blood culture (routine x 2)     Status: Abnormal   Collection Time: 04/12/23  7:08 PM   Specimen: BLOOD RIGHT HAND  Result Value Ref Range Status   Specimen Description BLOOD RIGHT HAND  Final   Special Requests   Final    BOTTLES DRAWN AEROBIC AND ANAEROBIC Blood Culture adequate volume   Culture  Setup Time   Final    GRAM NEGATIVE RODS IN BOTH AEROBIC AND ANAEROBIC BOTTLES CRITICAL RESULT CALLED TO, READ BACK BY AND VERIFIED WITH: BTilda Burrow PHARMD, AT 1041 04/13/23 Renato Shin Performed at [facility], [address], [place], [place] [address]    Culture KLEBSIELLA PNEUMONIAE (A)  Final   Report Status 04/16/2023 FINAL  Final   Organism ID, Bacteria KLEBSIELLA PNEUMONIAE  Final      Susceptibility    Klebsiella pneumoniae - MIC*    AMPICILLIN >=32 RESISTANT Resistant     CEFEPIME <=0.12 SENSITIVE Sensitive     CEFTAZIDIME <=1 SENSITIVE Sensitive     CEFTRIAXONE <=0.25 SENSITIVE Sensitive     CIPROFLOXACIN <=0.25 SENSITIVE Sensitive     GENTAMICIN <=1 SENSITIVE Sensitive     IMIPENEM <=0.25 SENSITIVE Sensitive     TRIMETH/SULFA <=20 SENSITIVE Sensitive  AMPICILLIN/SULBACTAM >=32 RESISTANT Resistant     PIP/TAZO >=128 RESISTANT Resistant     * KLEBSIELLA PNEUMONIAE  Blood Culture ID Panel (Reflexed)     Status: Abnormal   Collection Time: 04/12/23  7:08 PM  Result Value Ref Range Status   Enterococcus faecalis NOT DETECTED NOT DETECTED Final   Enterococcus Faecium NOT DETECTED NOT DETECTED Final   Listeria monocytogenes NOT DETECTED NOT DETECTED Final   Staphylococcus species NOT DETECTED NOT DETECTED Final   Staphylococcus aureus (BCID) NOT DETECTED NOT DETECTED Final   Staphylococcus epidermidis NOT DETECTED NOT DETECTED Final   Staphylococcus lugdunensis NOT DETECTED NOT DETECTED Final   Streptococcus species NOT DETECTED NOT DETECTED Final   Streptococcus agalactiae NOT DETECTED NOT DETECTED Final   Streptococcus pneumoniae NOT DETECTED NOT DETECTED Final   Streptococcus pyogenes NOT DETECTED NOT DETECTED Final   A.calcoaceticus-baumannii NOT DETECTED NOT DETECTED Final   Bacteroides fragilis NOT DETECTED NOT DETECTED Final   Enterobacterales DETECTED (A) NOT DETECTED Final    Comment: Enterobacterales represent a large order of gram negative bacteria, not a single organism. CRITICAL RESULT CALLED TO, READ BACK BY AND VERIFIED WITH: B. AGEE PHARMD, AT 1041 04/13/23 D. VANHOOK    Enterobacter cloacae complex NOT DETECTED NOT DETECTED Final   Escherichia coli NOT DETECTED NOT DETECTED Final   Klebsiella aerogenes NOT DETECTED NOT DETECTED Final   Klebsiella oxytoca NOT DETECTED NOT DETECTED Final   Klebsiella pneumoniae DETECTED (A) NOT DETECTED Final    Comment: CRITICAL  RESULT CALLED TO, READ BACK BY AND VERIFIED WITH: B. AGEE PHARMD, AT 1041 04/13/23 D. VANHOOK    Proteus species NOT DETECTED NOT DETECTED Final   Salmonella species NOT DETECTED NOT DETECTED Final   Serratia marcescens NOT DETECTED NOT DETECTED Final   Haemophilus influenzae NOT DETECTED NOT DETECTED Final   Neisseria meningitidis NOT DETECTED NOT DETECTED Final   Pseudomonas aeruginosa NOT DETECTED NOT DETECTED Final   Stenotrophomonas maltophilia NOT DETECTED NOT DETECTED Final   Candida albicans NOT DETECTED NOT DETECTED Final   Candida auris NOT DETECTED NOT DETECTED Final   Candida glabrata NOT DETECTED NOT DETECTED Final   Candida krusei NOT DETECTED NOT DETECTED Final   Candida parapsilosis NOT DETECTED NOT DETECTED Final   Candida tropicalis NOT DETECTED NOT DETECTED Final   Cryptococcus neoformans/gattii NOT DETECTED NOT DETECTED Final   CTX-M ESBL NOT DETECTED NOT DETECTED Final   Carbapenem resistance IMP NOT DETECTED NOT DETECTED Final   Carbapenem resistance KPC NOT DETECTED NOT DETECTED Final   Carbapenem resistance NDM NOT DETECTED NOT DETECTED Final   Carbapenem resist OXA 48 LIKE NOT DETECTED NOT DETECTED Final   Carbapenem resistance VIM NOT DETECTED NOT DETECTED Final    Comment: Performed at [facility], [address], [place], [place] [address]  Blood culture (routine x 2)     Status: Abnormal   Collection Time: 04/12/23  7:38 PM   Specimen: BLOOD  Result Value Ref Range Status   Specimen Description BLOOD SITE NOT SPECIFIED  Final   Special Requests   Final    BOTTLES DRAWN AEROBIC AND ANAEROBIC Blood Culture adequate volume   Culture  Setup Time   Final    GRAM NEGATIVE RODS IN BOTH AEROBIC AND ANAEROBIC BOTTLES CRITICAL RESULT CALLED TO, READ BACK BY AND VERIFIED WITH: B. AGEE PHARMD, AT 1041 04/13/23 D. VANHOOK    Culture (A)  Final    KLEBSIELLA PNEUMONIAE SUSCEPTIBILITIES PERFORMED ON PREVIOUS CULTURE WITHIN THE LAST 5 DAYS.  Performed at [facility], [address], [place], [place] [address]    Report Status 04/16/2023 FINAL  Final  Culture, blood (Routine X 2) w Reflex to ID Panel     Status: None   Collection Time: 04/15/23  8:32 PM   Specimen: BLOOD  Result Value Ref Range Status   Specimen Description BLOOD BLOOD LEFT ARM  Final   Special Requests   Final    BOTTLES DRAWN AEROBIC AND ANAEROBIC Blood Culture adequate volume   Culture   Final    NO GROWTH 5 DAYS Performed at [facility], [address], [place], [place] [address]    Report Status 04/20/2023 FINAL  Final  Culture, blood (Routine X 2) w Reflex to ID Panel     Status: None   Collection Time: 04/15/23  8:35 PM   Specimen: BLOOD  Result Value Ref Range Status   Specimen Description BLOOD BLOOD LEFT HAND  Final   Special Requests   Final    BOTTLES DRAWN AEROBIC AND ANAEROBIC Blood Culture adequate volume   Culture   Final    NO GROWTH 5 DAYS Performed at [facility], [address], [place], [place] [address]    Report Status 04/20/2023 FINAL  Final       Radiology Studies: IR US Guide Vasc Access Right  Result Date: 04/19/2023 INDICATION: 77 year old male with history of end-stage renal disease requiring central venous access for hemodialysis. EXAM: 1. Ultrasound-guided vascular access of the right internal jugular vein. 2. Central venogram. 3. TUNNELED CENTRAL VENOUS HEMODIALYSIS CATHETER PLACEMENT WITH ULTRASOUND AND FLUOROSCOPIC GUIDANCE MEDICATIONS: Ancef 2 gm IV . The antibiotic was given in an appropriate time interval prior to skin puncture. ANESTHESIA/SEDATION: Moderate (conscious) sedation was employed during this procedure. A total of Versed 1 mg and Fentanyl 50 mcg was administered intravenously. Moderate Sedation Time: 40 minutes. The patient's level of consciousness and vital signs were monitored continuously by radiology nursing throughout the procedure under my direct supervision. FLUOROSCOPY TIME:  Thirty-one mGy  COMPLICATIONS: None immediate. PROCEDURE: Informed written consent was obtained from the patient after a discussion of the risks, benefits, and alternatives to treatment. Questions regarding the procedure were encouraged and answered. The bilateral neck and chest were prepped with chlorhexidine in a sterile fashion, and a sterile drape was applied covering the operative field. Maximum barrier sterile technique with sterile gowns and gloves were used for the procedure. A timeout was performed prior to the initiation of the procedure. After creating a small venotomy incision, a 21 gauge micropuncture kit was utilized to access the right internal jugular vein. Real-time ultrasound guidance was utilized for vascular access including the acquisition of a permanent ultrasound image documenting partial occlusion of the accessed vessel. The wire was unable to be advanced centrally. Venogram demonstrated occlusion of the right innominate vein. No mature collaterals are visualized. The needle was removed. Sterile bandage was applied. Preprocedure ultrasound evaluation of the left internal jugular vein demonstrated diminutive caliber and gradual tapering about its inferior portion. There is a prominent external jugular vein which appeared patent, draining into the subclavian vein. The external jugular vein was targeted for vena puncture. Subdermal Local anesthesia was administered with 1% lidocaine with epinephrine. A small skin nick was made. Under direct ultrasound visualization, the left external jugular vein was punctured with a 21 gauge micropuncture needle. Permanent ultrasound image was captured and stored in the record. Microwire was directed to the central veins. This was exchanged  for a micropuncture sheath through which a Rosen wire was advanced to the level of the IVC and the micropuncture sheath was exchanged for an 8 Fr dilator. A 14.5 French tunneled hemodialysis catheter measuring 28 cm from tip to cuff was  tunneled in a retrograde fashion from the anterior chest wall to the venotomy incision. Serial dilation was then performed an a peel-away sheath was placed. The catheter was then placed through the peel-away sheath with the catheter tip ultimately positioned within the right atrium. Final catheter positioning was confirmed and documented with a spot radiographic image. The catheter aspirates and flushes normally. The catheter was flushed with appropriate volume heparin dwells. The catheter exit site was secured with a 0-Silk retention suture. The venotomy incision was closed with Dermabond. Sterile dressings were applied. The patient tolerated the procedure well without immediate post procedural complication. IMPRESSION: 1. Successful placement of 28 cm tip to cuff tunneled hemodialysis catheter via the left external jugular vein with catheter tip terminating within the right atrium. The catheter is ready for immediate use. 2. Chronically occluded bilateral internal jugular veins. Marliss Coots, MD [facility] Radiology Electronically Signed   By: Marliss Coots M.D.   On: 04/19/2023 13:41   IR Veno/Jugular Right  Result Date: 04/19/2023 INDICATION: 77 year old male with history of end-stage renal disease requiring central venous access for hemodialysis. EXAM: 1. Ultrasound-guided vascular access of the right internal jugular vein. 2. Central venogram. 3. TUNNELED CENTRAL VENOUS HEMODIALYSIS CATHETER PLACEMENT WITH ULTRASOUND AND FLUOROSCOPIC GUIDANCE MEDICATIONS: Ancef 2 gm IV . The antibiotic was given in an appropriate time interval prior to skin puncture. ANESTHESIA/SEDATION: Moderate (conscious) sedation was employed during this procedure. A total of Versed 1 mg and Fentanyl 50 mcg was administered intravenously. Moderate Sedation Time: 40 minutes. The patient's level of consciousness and vital signs were monitored continuously by radiology nursing throughout  the procedure under my direct supervision. FLUOROSCOPY TIME:  Thirty-one mGy COMPLICATIONS: None immediate. PROCEDURE: Informed written consent was obtained from the patient after a discussion of the risks, benefits, and alternatives to treatment. Questions regarding the procedure were encouraged and answered. The bilateral neck and chest were prepped with chlorhexidine in a sterile fashion, and a sterile drape was applied covering the operative field. Maximum barrier sterile technique with sterile gowns and gloves were used for the procedure. A timeout was performed prior to the initiation of the procedure. After creating a small venotomy incision, a 21 gauge micropuncture kit was utilized to access the right internal jugular vein. Real-time ultrasound guidance was utilized for vascular access including the acquisition of a permanent ultrasound image documenting partial occlusion of the accessed vessel. The wire was unable to be advanced centrally. Venogram demonstrated occlusion of the right innominate vein. No mature collaterals are visualized. The needle was removed. Sterile bandage was applied. Preprocedure ultrasound evaluation of the left internal jugular vein demonstrated diminutive caliber and gradual tapering about its inferior portion. There is a prominent external jugular vein which appeared patent, draining into the subclavian vein. The external jugular vein was targeted for vena puncture. Subdermal Local anesthesia was administered with 1% lidocaine with epinephrine. A small skin nick was made. Under direct ultrasound visualization, the left external jugular vein was punctured with a 21 gauge micropuncture needle. Permanent ultrasound image was captured and stored in the record. Microwire was directed to the central veins. This was exchanged for a micropuncture sheath through which a Rosen wire was advanced to the level of the IVC and  the micropuncture sheath was exchanged for an 8 Fr dilator. A 14.5  French tunneled hemodialysis catheter measuring 28 cm from tip to cuff was tunneled in a retrograde fashion from the anterior chest wall to the venotomy incision. Serial dilation was then performed an a peel-away sheath was placed. The catheter was then placed through the peel-away sheath with the catheter tip ultimately positioned within the right atrium. Final catheter positioning was confirmed and documented with a spot radiographic image. The catheter aspirates and flushes normally. The catheter was flushed with appropriate volume heparin dwells. The catheter exit site was secured with a 0-Silk retention suture. The venotomy incision was closed with Dermabond. Sterile dressings were applied. The patient tolerated the procedure well without immediate post procedural complication. IMPRESSION: 1. Successful placement of 28 cm tip to cuff tunneled hemodialysis catheter via the left external jugular vein with catheter tip terminating within the right atrium. The catheter is ready for immediate use. 2. Chronically occluded bilateral internal jugular veins. Marliss Coots, MD [facility] Radiology Electronically Signed   By: Marliss Coots M.D.   On: 04/19/2023 13:41   IR Fluoro Guide CV Line Left  Result Date: 04/19/2023 INDICATION: 77 year old male with history of end-stage renal disease requiring central venous access for hemodialysis. EXAM: 1. Ultrasound-guided vascular access of the right internal jugular vein. 2. Central venogram. 3. TUNNELED CENTRAL VENOUS HEMODIALYSIS CATHETER PLACEMENT WITH ULTRASOUND AND FLUOROSCOPIC GUIDANCE MEDICATIONS: Ancef 2 gm IV . The antibiotic was given in an appropriate time interval prior to skin puncture. ANESTHESIA/SEDATION: Moderate (conscious) sedation was employed during this procedure. A total of Versed 1 mg and Fentanyl 50 mcg was administered intravenously. Moderate Sedation Time: 40 minutes. The patient's level of  consciousness and vital signs were monitored continuously by radiology nursing throughout the procedure under my direct supervision. FLUOROSCOPY TIME:  Thirty-one mGy COMPLICATIONS: None immediate. PROCEDURE: Informed written consent was obtained from the patient after a discussion of the risks, benefits, and alternatives to treatment. Questions regarding the procedure were encouraged and answered. The bilateral neck and chest were prepped with chlorhexidine in a sterile fashion, and a sterile drape was applied covering the operative field. Maximum barrier sterile technique with sterile gowns and gloves were used for the procedure. A timeout was performed prior to the initiation of the procedure. After creating a small venotomy incision, a 21 gauge micropuncture kit was utilized to access the right internal jugular vein. Real-time ultrasound guidance was utilized for vascular access including the acquisition of a permanent ultrasound image documenting partial occlusion of the accessed vessel. The wire was unable to be advanced centrally. Venogram demonstrated occlusion of the right innominate vein. No mature collaterals are visualized. The needle was removed. Sterile bandage was applied. Preprocedure ultrasound evaluation of the left internal jugular vein demonstrated diminutive caliber and gradual tapering about its inferior portion. There is a prominent external jugular vein which appeared patent, draining into the subclavian vein. The external jugular vein was targeted for vena puncture. Subdermal Local anesthesia was administered with 1% lidocaine with epinephrine. A small skin nick was made. Under direct ultrasound visualization, the left external jugular vein was punctured with a 21 gauge micropuncture needle. Permanent ultrasound image was captured and stored in the record. Microwire was directed to the central veins. This was exchanged for a micropuncture sheath through which a Rosen wire was advanced to the  level of the IVC and the micropuncture sheath was exchanged for an 8 Fr dilator. A 14.5 French tunneled  hemodialysis catheter measuring 28 cm from tip to cuff was tunneled in a retrograde fashion from the anterior chest wall to the venotomy incision. Serial dilation was then performed an a peel-away sheath was placed. The catheter was then placed through the peel-away sheath with the catheter tip ultimately positioned within the right atrium. Final catheter positioning was confirmed and documented with a spot radiographic image. The catheter aspirates and flushes normally. The catheter was flushed with appropriate volume heparin dwells. The catheter exit site was secured with a 0-Silk retention suture. The venotomy incision was closed with Dermabond. Sterile dressings were applied. The patient tolerated the procedure well without immediate post procedural complication. IMPRESSION: 1. Successful placement of 28 cm tip to cuff tunneled hemodialysis catheter via the left external jugular vein with catheter tip terminating within the right atrium. The catheter is ready for immediate use. 2. Chronically occluded bilateral internal jugular veins. Marliss Coots, MD [facility] Radiology Electronically Signed   By: Marliss Coots M.D.   On: 04/19/2023 13:41      LOS: 7 days   Joycelyn Das, MD [facility] If 7PM-7AM, please contact night-coverage  04/20/2023, 11:40 AM

## 2023-04-20 NOTE — Progress Notes (Signed)
Physical Therapy Treatment Patient Details Name: Justin [name] MRN: [id number] DOB: Dec 20, 1945 Today's Date: 04/20/2023   History of Present Illness 77 y.o. male presents to [facility] hospital on 04/12/2023 with persistent L groin pain. Pt recently admitted in September for management of sepsis and ischial abscess. PMH includes ESRD on HD, GIB, rectal abscess, cholecystitis with drain, DMII, chronic hypotension, ambulatory dysfunction.    PT Comments  Pt greeted resting in bed, post HD session, agreeable to session with steady progress towards acute goals. Pt able to come to sitting EOB with supervision for safety with pt demonstrating self mobilization of LLE. With mod-max A pt able to come to partial stand x2 and full upright standing x2 in stedy frame. Pt able to maintain standing ~60 seconds before needing to sit. Educated pt on importance of continued mobility and time up OOB with pt verbalizing understanding, however declining transfer to chair. Current plan remains appropriate to address deficits and maximize functional independence and decrease caregiver burden. Pt continues to benefit from skilled PT services to progress toward functional mobility goals.      If plan is discharge home, recommend the following: Two people to help with walking and/or transfers;Two people to help with bathing/dressing/bathroom;Assistance with cooking/housework;Assist for transportation;Help with stairs or ramp for entrance   Can travel by private vehicle     No  Equipment Recommendations  Hospital bed    Recommendations for Other Services       Precautions / Restrictions Precautions Precautions: Fall Precaution Comments: RLQ cholecystitis drain Restrictions Weight Bearing Restrictions: No     Mobility  Bed Mobility Overal bed mobility: Needs Assistance Bed Mobility: Supine to Sit, Sit to Supine     Supine to sit: Used rails, Supervision Sit to supine: Mod assist   General bed mobility comments:  Uses UE to assist LLE to EOB, mod A to return LEs to bed at end fo session    Transfers Overall transfer level: Needs assistance Equipment used: Ambulation equipment used Transfers: Sit to/from Stand Sit to Stand: Max assist, Mod assist           General transfer comment: max A to come to partiaol stand x2, mod A to power up to full stand on last 2 attempts, pt able to maintain standing ~60 seconds    Ambulation/Gait                   Stairs             Wheelchair Mobility     Tilt Bed    Modified Rankin (Stroke Patients Only)       Balance Overall balance assessment: Needs assistance Sitting-balance support: Single extremity supported, Feet supported Sitting balance-Leahy Scale: Fair Sitting balance - Comments: right lateral lean, pt does not shift weight onto L pelvis due to pain   Standing balance support: Bilateral upper extremity supported Standing balance-Leahy Scale: Poor Standing balance comment: reliant on heavy UE support                            Cognition Arousal: Alert Behavior During Therapy: WFL for tasks assessed/performed Overall Cognitive Status: Within Functional Limits for tasks assessed                                          Exercises      General  Comments General comments (skin integrity, edema, etc.): VSS on RA      Pertinent Vitals/Pain Pain Assessment Pain Assessment: Faces Faces Pain Scale: Hurts little more Pain Location: BLE with weight bearing 2/2 neuropathy Pain Descriptors / Indicators: Discomfort Pain Intervention(s): Monitored during session, Limited activity within patient's tolerance    Home Living                          Prior Function            PT Goals (current goals can now be found in the care plan section) Acute Rehab PT Goals Patient Stated Goal: to return to standing PT Goal Formulation: With patient Time For Goal Achievement:  04/28/23 Progress towards PT goals: Progressing toward goals    Frequency    Min 1X/week      PT Plan      Co-evaluation              AM-PAC PT "6 Clicks" Mobility   Outcome Measure  Help needed turning from your back to your side while in a flat bed without using bedrails?: A Little Help needed moving from lying on your back to sitting on the side of a flat bed without using bedrails?: A Little Help needed moving to and from a bed to a chair (including a wheelchair)?: Total Help needed standing up from a chair using your arms (e.g., wheelchair or bedside chair)?: A Lot Help needed to walk in hospital room?: Total Help needed climbing 3-5 steps with a railing? : Total 6 Click Score: 11    End of Session Equipment Utilized During Treatment:  (BLE post op shoes) Activity Tolerance: Patient tolerated treatment well Patient left: in bed;with call bell/phone within reach;with bed alarm set Nurse Communication: Mobility status PT Visit Diagnosis: Muscle weakness (generalized) (M62.81);Difficulty in walking, not elsewhere classified (R26.2);Other abnormalities of gait and mobility (R26.89);Pain Pain - Right/Left:  (bilateral) Pain - part of body: Ankle and joints of foot     Time: 1610-9604 PT Time Calculation (min) (ACUTE ONLY): 26 min  Charges:    $Therapeutic Activity: 23-37 mins PT General Charges $$ ACUTE PT VISIT: 1 Visit                     Yasmina Chico R. PTA Acute Rehabilitation Services Office: [phone]   Catalina Antigua 04/20/2023, 4:00 PM

## 2023-04-20 NOTE — Progress Notes (Signed)
PT Cancellation Note  Patient Details Name: Justin [name] MRN: [id number] DOB: 10-05-1945   Cancelled Treatment:    Reason Eval/Treat Not Completed: (P) Patient at procedure or test/unavailable,pt off unit at HD. Will check back as schedule allows to continue with PT POC.  Lenora Boys. PTA Acute Rehabilitation Services Office: [phone]    Catalina Antigua 04/20/2023, 11:55 AM

## 2023-04-20 NOTE — Progress Notes (Signed)
[facility] Progress Note   Subjective:   Patient seen and examined at bedside during dialysis.  Tolerating treatment well so far. No specific complaints. Off schedule today.  TDC placed yesterday.   Objective Vitals:   04/20/23 1115 04/20/23 1130 04/20/23 1135 04/20/23 1200  BP: (!) 106/93 (!) 59/42 (!) 66/42 (!) 95/48  Pulse: 62 61 71 (!) 59  Resp: 12 (!) 6 11 19   Temp:    97.9 F (36.6 C)  TempSrc:      SpO2: 99% 98% 97% 100%  Weight:    80.5 kg  Height:       Physical Exam General:chronically ill appearing male in NAD Heart:RRR, no mrg Lungs:CTAB, nml WOB on RA Abdomen:soft, NTND Extremities:no LE edema Dialysis Access: [facility] in use   Filed Weights   04/17/23 0509 04/20/23 0817 04/20/23 1200  Weight: 82 kg 80.9 kg 80.5 kg    Intake/Output Summary (Last 24 hours) at 04/20/2023 1219 Last data filed at 04/20/2023 1200 Gross per 24 hour  Intake --  Output 500 ml  Net -500 ml    Additional Objective Labs: Basic Metabolic Panel: Recent Labs  Lab 04/14/23 1151 04/16/23 0616 04/18/23 0614 04/20/23 0930  NA 140 134* 134* 134*  K 3.6 3.4* 4.0 4.1  CL 100 94* 95* 95*  CO2 22 26 25 25   GLUCOSE 107* 96 79 73  BUN 63* 35* 25* 37*  CREATININE 10.31* 7.68* 7.01* 8.96*  CALCIUM 8.5* 8.2* 8.0* 7.9*  PHOS 5.4*  --   --  5.2*   Liver Function Tests: Recent Labs  Lab 04/14/23 1151 04/20/23 0930  ALBUMIN 2.1* 2.0*   CBC: Recent Labs  Lab 04/14/23 1151 04/16/23 0616 04/18/23 0614 04/20/23 0930  WBC 7.2 6.3 8.0 7.2  HGB 9.0* 9.1* 9.6* 9.3*  HCT 28.0* 29.2* 31.1* 30.3*  MCV 96.6 94.5 94.5 93.8  PLT 193 215 234 241   CBG: Recent Labs  Lab 04/18/23 1140 04/18/23 1451 04/18/23 2119 04/19/23 2026 04/20/23 0732  GLUCAP 91 89 92 83 64*   Studies/Results: IR US Guide Vasc Access Right  Result Date: 04/19/2023 INDICATION: 77 year old male with history of end-stage renal disease requiring central venous access for hemodialysis. EXAM: 1.  Ultrasound-guided vascular access of the right internal jugular vein. 2. Central venogram. 3. TUNNELED CENTRAL VENOUS HEMODIALYSIS CATHETER PLACEMENT WITH ULTRASOUND AND FLUOROSCOPIC GUIDANCE MEDICATIONS: Ancef 2 gm IV . The antibiotic was given in an appropriate time interval prior to skin puncture. ANESTHESIA/SEDATION: Moderate (conscious) sedation was employed during this procedure. A total of Versed 1 mg and Fentanyl 50 mcg was administered intravenously. Moderate Sedation Time: 40 minutes. The patient's level of consciousness and vital signs were monitored continuously by radiology nursing throughout the procedure under my direct supervision. FLUOROSCOPY TIME:  Thirty-one mGy COMPLICATIONS: None immediate. PROCEDURE: Informed written consent was obtained from the patient after a discussion of the risks, benefits, and alternatives to treatment. Questions regarding the procedure were encouraged and answered. The bilateral neck and chest were prepped with chlorhexidine in a sterile fashion, and a sterile drape was applied covering the operative field. Maximum barrier sterile technique with sterile gowns and gloves were used for the procedure. A timeout was performed prior to the initiation of the procedure. After creating a small venotomy incision, a 21 gauge micropuncture kit was utilized to access the right internal jugular vein. Real-time ultrasound guidance was utilized for vascular access including the acquisition of a permanent ultrasound image documenting partial occlusion of the accessed vessel.  The wire was unable to be advanced centrally. Venogram demonstrated occlusion of the right innominate vein. No mature collaterals are visualized. The needle was removed. Sterile bandage was applied. Preprocedure ultrasound evaluation of the left internal jugular vein demonstrated diminutive caliber and gradual tapering about its inferior portion. There is a prominent external jugular vein which appeared patent,  draining into the subclavian vein. The external jugular vein was targeted for vena puncture. Subdermal Local anesthesia was administered with 1% lidocaine with epinephrine. A small skin nick was made. Under direct ultrasound visualization, the left external jugular vein was punctured with a 21 gauge micropuncture needle. Permanent ultrasound image was captured and stored in the record. Microwire was directed to the central veins. This was exchanged for a micropuncture sheath through which a Rosen wire was advanced to the level of the IVC and the micropuncture sheath was exchanged for an 8 Fr dilator. A 14.5 French tunneled hemodialysis catheter measuring 28 cm from tip to cuff was tunneled in a retrograde fashion from the anterior chest wall to the venotomy incision. Serial dilation was then performed an a peel-away sheath was placed. The catheter was then placed through the peel-away sheath with the catheter tip ultimately positioned within the right atrium. Final catheter positioning was confirmed and documented with a spot radiographic image. The catheter aspirates and flushes normally. The catheter was flushed with appropriate volume heparin dwells. The catheter exit site was secured with a 0-Silk retention suture. The venotomy incision was closed with Dermabond. Sterile dressings were applied. The patient tolerated the procedure well without immediate post procedural complication. IMPRESSION: 1. Successful placement of 28 cm tip to cuff tunneled hemodialysis catheter via the left external jugular vein with catheter tip terminating within the right atrium. The catheter is ready for immediate use. 2. Chronically occluded bilateral internal jugular veins. Marliss Coots, MD [facility] Radiology Electronically Signed   By: Marliss Coots M.D.   On: 04/19/2023 13:41   IR Veno/Jugular Right  Result Date: 04/19/2023 INDICATION: 77 year old male with history of  end-stage renal disease requiring central venous access for hemodialysis. EXAM: 1. Ultrasound-guided vascular access of the right internal jugular vein. 2. Central venogram. 3. TUNNELED CENTRAL VENOUS HEMODIALYSIS CATHETER PLACEMENT WITH ULTRASOUND AND FLUOROSCOPIC GUIDANCE MEDICATIONS: Ancef 2 gm IV . The antibiotic was given in an appropriate time interval prior to skin puncture. ANESTHESIA/SEDATION: Moderate (conscious) sedation was employed during this procedure. A total of Versed 1 mg and Fentanyl 50 mcg was administered intravenously. Moderate Sedation Time: 40 minutes. The patient's level of consciousness and vital signs were monitored continuously by radiology nursing throughout the procedure under my direct supervision. FLUOROSCOPY TIME:  Thirty-one mGy COMPLICATIONS: None immediate. PROCEDURE: Informed written consent was obtained from the patient after a discussion of the risks, benefits, and alternatives to treatment. Questions regarding the procedure were encouraged and answered. The bilateral neck and chest were prepped with chlorhexidine in a sterile fashion, and a sterile drape was applied covering the operative field. Maximum barrier sterile technique with sterile gowns and gloves were used for the procedure. A timeout was performed prior to the initiation of the procedure. After creating a small venotomy incision, a 21 gauge micropuncture kit was utilized to access the right internal jugular vein. Real-time ultrasound guidance was utilized for vascular access including the acquisition of a permanent ultrasound image documenting partial occlusion of the accessed vessel. The wire was unable to be advanced centrally. Venogram demonstrated occlusion of the right innominate vein. No mature  collaterals are visualized. The needle was removed. Sterile bandage was applied. Preprocedure ultrasound evaluation of the left internal jugular vein demonstrated diminutive caliber and gradual tapering about its  inferior portion. There is a prominent external jugular vein which appeared patent, draining into the subclavian vein. The external jugular vein was targeted for vena puncture. Subdermal Local anesthesia was administered with 1% lidocaine with epinephrine. A small skin nick was made. Under direct ultrasound visualization, the left external jugular vein was punctured with a 21 gauge micropuncture needle. Permanent ultrasound image was captured and stored in the record. Microwire was directed to the central veins. This was exchanged for a micropuncture sheath through which a Rosen wire was advanced to the level of the IVC and the micropuncture sheath was exchanged for an 8 Fr dilator. A 14.5 French tunneled hemodialysis catheter measuring 28 cm from tip to cuff was tunneled in a retrograde fashion from the anterior chest wall to the venotomy incision. Serial dilation was then performed an a peel-away sheath was placed. The catheter was then placed through the peel-away sheath with the catheter tip ultimately positioned within the right atrium. Final catheter positioning was confirmed and documented with a spot radiographic image. The catheter aspirates and flushes normally. The catheter was flushed with appropriate volume heparin dwells. The catheter exit site was secured with a 0-Silk retention suture. The venotomy incision was closed with Dermabond. Sterile dressings were applied. The patient tolerated the procedure well without immediate post procedural complication. IMPRESSION: 1. Successful placement of 28 cm tip to cuff tunneled hemodialysis catheter via the left external jugular vein with catheter tip terminating within the right atrium. The catheter is ready for immediate use. 2. Chronically occluded bilateral internal jugular veins. Marliss Coots, MD [facility] Radiology Electronically Signed   By: Marliss Coots M.D.   On: 04/19/2023 13:41   IR Fluoro Guide  CV Line Left  Result Date: 04/19/2023 INDICATION: 77 year old male with history of end-stage renal disease requiring central venous access for hemodialysis. EXAM: 1. Ultrasound-guided vascular access of the right internal jugular vein. 2. Central venogram. 3. TUNNELED CENTRAL VENOUS HEMODIALYSIS CATHETER PLACEMENT WITH ULTRASOUND AND FLUOROSCOPIC GUIDANCE MEDICATIONS: Ancef 2 gm IV . The antibiotic was given in an appropriate time interval prior to skin puncture. ANESTHESIA/SEDATION: Moderate (conscious) sedation was employed during this procedure. A total of Versed 1 mg and Fentanyl 50 mcg was administered intravenously. Moderate Sedation Time: 40 minutes. The patient's level of consciousness and vital signs were monitored continuously by radiology nursing throughout the procedure under my direct supervision. FLUOROSCOPY TIME:  Thirty-one mGy COMPLICATIONS: None immediate. PROCEDURE: Informed written consent was obtained from the patient after a discussion of the risks, benefits, and alternatives to treatment. Questions regarding the procedure were encouraged and answered. The bilateral neck and chest were prepped with chlorhexidine in a sterile fashion, and a sterile drape was applied covering the operative field. Maximum barrier sterile technique with sterile gowns and gloves were used for the procedure. A timeout was performed prior to the initiation of the procedure. After creating a small venotomy incision, a 21 gauge micropuncture kit was utilized to access the right internal jugular vein. Real-time ultrasound guidance was utilized for vascular access including the acquisition of a permanent ultrasound image documenting partial occlusion of the accessed vessel. The wire was unable to be advanced centrally. Venogram demonstrated occlusion of the right innominate vein. No mature collaterals are visualized. The needle was removed. Sterile bandage was applied. Preprocedure ultrasound evaluation of  the left  internal jugular vein demonstrated diminutive caliber and gradual tapering about its inferior portion. There is a prominent external jugular vein which appeared patent, draining into the subclavian vein. The external jugular vein was targeted for vena puncture. Subdermal Local anesthesia was administered with 1% lidocaine with epinephrine. A small skin nick was made. Under direct ultrasound visualization, the left external jugular vein was punctured with a 21 gauge micropuncture needle. Permanent ultrasound image was captured and stored in the record. Microwire was directed to the central veins. This was exchanged for a micropuncture sheath through which a Rosen wire was advanced to the level of the IVC and the micropuncture sheath was exchanged for an 8 Fr dilator. A 14.5 French tunneled hemodialysis catheter measuring 28 cm from tip to cuff was tunneled in a retrograde fashion from the anterior chest wall to the venotomy incision. Serial dilation was then performed an a peel-away sheath was placed. The catheter was then placed through the peel-away sheath with the catheter tip ultimately positioned within the right atrium. Final catheter positioning was confirmed and documented with a spot radiographic image. The catheter aspirates and flushes normally. The catheter was flushed with appropriate volume heparin dwells. The catheter exit site was secured with a 0-Silk retention suture. The venotomy incision was closed with Dermabond. Sterile dressings were applied. The patient tolerated the procedure well without immediate post procedural complication. IMPRESSION: 1. Successful placement of 28 cm tip to cuff tunneled hemodialysis catheter via the left external jugular vein with catheter tip terminating within the right atrium. The catheter is ready for immediate use. 2. Chronically occluded bilateral internal jugular veins. Marliss Coots, MD [facility] Radiology  Electronically Signed   By: Marliss Coots M.D.   On: 04/19/2023 13:41    Medications:  cefTAZidime (FORTAZ)  IV 1 g (04/19/23 1830)    amiodarone  200 mg Oral Daily   apixaban  2.5 mg Oral BID   atorvastatin  40 mg Oral QHS   calcitRIOL  0.25 mcg Oral Daily   Chlorhexidine Gluconate Cloth  6 each Topical Daily   Chlorhexidine Gluconate Cloth  6 each Topical Q0600   Chlorhexidine Gluconate Cloth  6 each Topical Q0600   cinacalcet  30 mg Oral Q supper   darbepoetin (ARANESP) injection - NON-DIALYSIS  60 mcg Subcutaneous Q Mon-1800   digoxin  0.0625 mg Oral QODAY   heparin sodium (porcine)  4.2 mL Intravenous Once   midodrine  20 mg Oral TID WC   pregabalin  75 mg Oral Daily   sevelamer carbonate  800 mg Oral TID WC    Dialysis Orders: [facility]  3h  450/1.5    82kg  2/2.5 bath  TDC   Heparin none - last OP HD 9/27, post wt 82.3kg   CXR 9/30 - no active disease   Assessment/Plan: GNR bacteremia / acute L hip osteomyelitis/ hip pain - On IV rocephin and po flagyl.  ID planning 6 week total abx course until Fortaz 2g IV qHD until 05/27/23. Pt has TDC in place. ID has requested line holiday, IR removed TDC on 10/4, replaced 10.7 by IR. Appreciate their assistance.  Afib / RVR - per pmd ESRD - on HD MWF. HD today off schedule due to increased acuity/patient census. Plan for HD again tomorrow to get back on regular schedule.  BP/ chronic hypotension - takes midodrine 20 tid at home, continued here. BP's stable. Volume - Does not appear volume overloaded. Under  his dry weight, likely weight loss and needs to be lowered on d/c.  UF as tolerated.  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 Start aranesp qwk yesterday.   MBD ckd - CCa and phos controlled. Cont sensipar, po vdra and renvela as binder.  Nutrition - renal diet w/fluid restrictions when no longer NPO.  Dispo - Going to SNF @ [employer].  Waiting for acceptance to [facility].   Virgina Norfolk, PA-C [facility] 04/20/2023,12:19 PM  LOS: 7 days

## 2023-04-21 ENCOUNTER — Ambulatory Visit (HOSPITAL_BASED_OUTPATIENT_CLINIC_OR_DEPARTMENT_OTHER): Payer: Medicare Other | Admitting: General Surgery

## 2023-04-21 ENCOUNTER — Other Ambulatory Visit (HOSPITAL_COMMUNITY): Payer: Self-pay

## 2023-04-21 ENCOUNTER — Encounter: Payer: Self-pay | Admitting: Gastroenterology

## 2023-04-21 DIAGNOSIS — M25552 Pain in left hip: Secondary | ICD-10-CM | POA: Diagnosis not present

## 2023-04-21 LAB — GLUCOSE, CAPILLARY: Glucose-Capillary: 70 mg/dL (ref 70–99)

## 2023-04-21 MED ORDER — DIGOXIN 62.5 MCG PO TABS
0.0625 mg | ORAL_TABLET | ORAL | 0 refills | Status: DC
  Filled 2023-04-21: qty 15, 30d supply, fill #0

## 2023-04-21 MED ORDER — OXYCODONE-ACETAMINOPHEN 5-325 MG PO TABS: 1.0000 | ORAL_TABLET | Freq: Four times a day (QID) | ORAL | 0 refills | Status: AC | PRN

## 2023-04-21 MED ORDER — SODIUM CHLORIDE 0.9 % IV SOLN: 1.0000 g | INTRAVENOUS | 0 refills | Status: DC

## 2023-04-21 MED ORDER — MIDODRINE HCL 10 MG PO TABS
20.0000 mg | ORAL_TABLET | Freq: Three times a day (TID) | ORAL | 0 refills | Status: AC
  Filled 2023-04-21: qty 180, 30d supply, fill #0

## 2023-04-21 MED ORDER — CINACALCET HCL 30 MG PO TABS
30.0000 mg | ORAL_TABLET | Freq: Every day | ORAL | 0 refills | Status: AC
Start: 2023-04-21 — End: 2023-05-21
  Filled 2023-04-21: qty 30, 30d supply, fill #0

## 2023-04-21 NOTE — Discharge Instructions (Addendum)
Advised to follow-up with Nephrology as scheduled. Patient is being discharged to [facility] Patient will continue Fortaz infusion during hemodialysis for 6 weeks. Follow-up infectious diseases as scheduled in 4 weeks. Patient has Kleb bacteremia and ischial osteomyelitis

## 2023-04-21 NOTE — Progress Notes (Addendum)
Pt has been accepted at [facility] on TTS 1:00 pm chair time. Pt can start tomorrow and will need to arrive at 12:15 to complete paperwork prior to treatment. Update provided to attending, nephrologist, renal PA, pt's RN, and CSW. Will add arrangements to AVS and assist as needed.   Olivia Canter Renal Navigator [phone]  Addendum at 1:59 pm: Pt to d/c today to snf. Contacted [facility] to advise clinic of pt's d/c today and that pt will start tomorrow. D/C summary, last renal note, pharmacy note from 10/4, and ID note from 10/3 faxed to clinic for continuation of care. Clinic confirms having iv abx available for pt's treatment tomorrow.

## 2023-04-21 NOTE — Discharge Summary (Signed)
Physician Discharge Summary  Justin [name]:[id number] DOB: [birth year] DOA: 04/12/2023  Justin [name]: Justin [name], Justin [name]  Admit date: 04/12/2023  Discharge date: 04/21/2023  Admitted From: Home.  Disposition:  [facility] rehab  Recommendations for Outpatient Follow-up:  Follow up with Justin [name] in 1-2 weeks. Please obtain BMP/CBC in one week. Advised to follow-up with Nephrology as scheduled. Patient is being discharged to [facility] Patient will continue Fortaz infusion during hemodialysis for 6 weeks. Follow-up infectious diseases as scheduled in 4 weeks. Patient has Kleb bacteremia and ischial osteomyelitis   Home Health:None Equipment/Devices:TDC  Discharge Condition: Stable CODE STATUS:Full code Diet recommendation: Heart Healthy  Brief [facility] Course: This 77 year old male with history of ESRD on HD, GIB, rectal abscess, cholecystitis with percutaneous drain, diabetes mellitus type 2 diet-controlled, chronic hypotension on midodrine, ambulatory dysfunction/wheelchair-bound for the last 3-4 months was admitted to the hospital last month for ESRD and septic shock.  Patient was noted to have 3.5 cm ischial abscess which was  drained by IR, cultures were negative, He has completed antibiotic course in the hospital and was discharged on September 26.  Patient now presented back to the hospital again with persistent left groin pain.  IR reviewed images, there is Justin [name] obvious area requiring drainage.  Currently on empiric antibiotics. ID on board.  Patient underwent line holiday on 04/16/2023 due to bacteremia. Status post repeat [facility] catheter placement on 04/19/2023 after line holiday.  Infectious disease recommended IV antibiotics during hemodialysis for 6 weeks.  Patient will get Fortaz antibiotics during hemodialysis days.  Patient is being discharged to [facility] rehab, hemodialysis outpatient set up has been completed.  Patient will start hemodialysis outpatient from tomorrow.  Nephrology signed  off.  Patient is being discharged.    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   CT scan of the left hip with small fluid collection but after reviewing by IR it is not sufficient for any sort of aspiration therefore recommending IV antibiotics.  ID on board and recommend dialysis catheter holiday.  Repeat blood culture from 04/15/2023 negative in 5 days.  Currently on Fortaz.   Plan for 6 weeks of IV antibiotics during HD as per infectious disease. Sepsis physiology improving.  Patient will get IV antibiotics during hemodialysis.   Hypoglycemia : Improved.     Paroxysmal atrial fibrillation with RVR (HCC): Continue amiodarone, digoxin, Eliquis.  Rate controlled at this time.   ESRD Monday Wednesday Friday  hemodialysis : Continue midodrine for chronic hypotension.  Nephrology on board for hemodialysis needs.    TDC was removed 04/16/23 for line holiday through the weekend.   Status post  new hemodialysis catheter placement on 04/19/2023.   Outpatient hemodialysis has been set up.  Patient will resume hemodialysis from tomorrow   History of cholecystitis status post percutaneous cholecystostomy tube in place. Status post percutaneous cholecystostomy tube in place changed to regularly at [facility].   Not a surgical candidate as per previous evaluation, nursing staff reported some leakage.   Asked to see if he is getting adequately flushed and change the dressing and see if it soaks  Physician Discharge Summary  Justin [name]:[id number] DOB: [birth year] DOA: 04/12/2023  Justin [name]: Justin [name], Justin [name]  Admit date: 04/12/2023  Discharge date: 04/21/2023  Admitted From: Home.  Disposition:  [facility] rehab  Recommendations for Outpatient Follow-up:  Follow up with Justin [name] in 1-2 weeks. Please obtain BMP/CBC in one week. Advised to follow-up with Nephrology as scheduled. Patient is being discharged to [facility] Patient will continue Fortaz infusion during hemodialysis for 6 weeks. Follow-up infectious diseases as scheduled in 4 weeks. Patient has Kleb bacteremia and ischial osteomyelitis   Home Health:None Equipment/Devices:TDC  Discharge Condition: Stable CODE STATUS:Full code Diet recommendation: Heart Healthy  Brief [facility] Course: This 77 year old male with history of ESRD on HD, GIB, rectal abscess, cholecystitis with percutaneous drain, diabetes mellitus type 2 diet-controlled, chronic hypotension on midodrine, ambulatory dysfunction/wheelchair-bound for the last 3-4 months was admitted to the hospital last month for ESRD and septic shock.  Patient was noted to have 3.5 cm ischial abscess which was  drained by IR, cultures were negative, He has completed antibiotic course in the hospital and was discharged on September 26.  Patient now presented back to the hospital again with persistent left groin pain.  IR reviewed images, there is Justin [name] obvious area requiring drainage.  Currently on empiric antibiotics. ID on board.  Patient underwent line holiday on 04/16/2023 due to bacteremia. Status post repeat [facility] catheter placement on 04/19/2023 after line holiday.  Infectious disease recommended IV antibiotics during hemodialysis for 6 weeks.  Patient will get Fortaz antibiotics during hemodialysis days.  Patient is being discharged to [facility] rehab, hemodialysis outpatient set up has been completed.  Patient will start hemodialysis outpatient from tomorrow.  Nephrology signed  off.  Patient is being discharged.    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   CT scan of the left hip with small fluid collection but after reviewing by IR it is not sufficient for any sort of aspiration therefore recommending IV antibiotics.  ID on board and recommend dialysis catheter holiday.  Repeat blood culture from 04/15/2023 negative in 5 days.  Currently on Fortaz.   Plan for 6 weeks of IV antibiotics during HD as per infectious disease. Sepsis physiology improving.  Patient will get IV antibiotics during hemodialysis.   Hypoglycemia : Improved.     Paroxysmal atrial fibrillation with RVR (HCC): Continue amiodarone, digoxin, Eliquis.  Rate controlled at this time.   ESRD Monday Wednesday Friday  hemodialysis : Continue midodrine for chronic hypotension.  Nephrology on board for hemodialysis needs.    TDC was removed 04/16/23 for line holiday through the weekend.   Status post  new hemodialysis catheter placement on 04/19/2023.   Outpatient hemodialysis has been set up.  Patient will resume hemodialysis from tomorrow   History of cholecystitis status post percutaneous cholecystostomy tube in place. Status post percutaneous cholecystostomy tube in place changed to regularly at [facility].   Not a surgical candidate as per previous evaluation, nursing staff reported some leakage.   Asked to see if he is getting adequately flushed and change the dressing and see if it soaks  Physician Discharge Summary  Justin [name]:[id number] DOB: [birth year] DOA: 04/12/2023  Justin [name]: Justin [name], Justin [name]  Admit date: 04/12/2023  Discharge date: 04/21/2023  Admitted From: Home.  Disposition:  [facility] rehab  Recommendations for Outpatient Follow-up:  Follow up with Justin [name] in 1-2 weeks. Please obtain BMP/CBC in one week. Advised to follow-up with Nephrology as scheduled. Patient is being discharged to [facility] Patient will continue Fortaz infusion during hemodialysis for 6 weeks. Follow-up infectious diseases as scheduled in 4 weeks. Patient has Kleb bacteremia and ischial osteomyelitis   Home Health:None Equipment/Devices:TDC  Discharge Condition: Stable CODE STATUS:Full code Diet recommendation: Heart Healthy  Brief [facility] Course: This 77 year old male with history of ESRD on HD, GIB, rectal abscess, cholecystitis with percutaneous drain, diabetes mellitus type 2 diet-controlled, chronic hypotension on midodrine, ambulatory dysfunction/wheelchair-bound for the last 3-4 months was admitted to the hospital last month for ESRD and septic shock.  Patient was noted to have 3.5 cm ischial abscess which was  drained by IR, cultures were negative, He has completed antibiotic course in the hospital and was discharged on September 26.  Patient now presented back to the hospital again with persistent left groin pain.  IR reviewed images, there is Justin [name] obvious area requiring drainage.  Currently on empiric antibiotics. ID on board.  Patient underwent line holiday on 04/16/2023 due to bacteremia. Status post repeat [facility] catheter placement on 04/19/2023 after line holiday.  Infectious disease recommended IV antibiotics during hemodialysis for 6 weeks.  Patient will get Fortaz antibiotics during hemodialysis days.  Patient is being discharged to [facility] rehab, hemodialysis outpatient set up has been completed.  Patient will start hemodialysis outpatient from tomorrow.  Nephrology signed  off.  Patient is being discharged.    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   CT scan of the left hip with small fluid collection but after reviewing by IR it is not sufficient for any sort of aspiration therefore recommending IV antibiotics.  ID on board and recommend dialysis catheter holiday.  Repeat blood culture from 04/15/2023 negative in 5 days.  Currently on Fortaz.   Plan for 6 weeks of IV antibiotics during HD as per infectious disease. Sepsis physiology improving.  Patient will get IV antibiotics during hemodialysis.   Hypoglycemia : Improved.     Paroxysmal atrial fibrillation with RVR (HCC): Continue amiodarone, digoxin, Eliquis.  Rate controlled at this time.   ESRD Monday Wednesday Friday  hemodialysis : Continue midodrine for chronic hypotension.  Nephrology on board for hemodialysis needs.    TDC was removed 04/16/23 for line holiday through the weekend.   Status post  new hemodialysis catheter placement on 04/19/2023.   Outpatient hemodialysis has been set up.  Patient will resume hemodialysis from tomorrow   History of cholecystitis status post percutaneous cholecystostomy tube in place. Status post percutaneous cholecystostomy tube in place changed to regularly at [facility].   Not a surgical candidate as per previous evaluation, nursing staff reported some leakage.   Asked to see if he is getting adequately flushed and change the dressing and see if it soaks  cervical spine: Normal marrow signal. Sinuses/Orbits: Negative. Other: None. IMPRESSION: 1. Justin [name] acute intracranial abnormality. 2. Findings of chronic microvascular ischemia. Electronically Signed   By: Deatra Robinson M.D.   On: 04/12/2023  23:23   DG Chest Portable 1 View  Result Date: 04/12/2023 CLINICAL DATA:  Pacemaker EXAM: PORTABLE CHEST 1 VIEW COMPARISON:  03/29/2023. FINDINGS: Unremarkable cardiac silhouette. Right-sided hemodialysis catheter tip at the RA/SVC junction. Alveolar opacity left base consistent with an early pneumonia or volume loss. Calcified aorta. Justin [name] pneumothorax. Justin [name] pleural effusion. IMPRESSION: Early/mild left base consolidation or volume loss. 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 Electronically Signed   By: Layla Maw M.D.   On: 04/12/2023 19:40   CT GUIDED NEEDLE PLACEMENT  Result Date: 03/30/2023 INDICATION: [id number] Abscess [id number] EXAM: CT-GUIDED ASPIRATION OF LEFT ISCHIAL ABSCESS COMPARISON:  CT AP, 03/29/2023. MEDICATIONS: The patient is currently admitted to the hospital and receiving intravenous antibiotics. The antibiotics were administered within an appropriate time frame prior to the initiation of the procedure. ANESTHESIA/SEDATION: Local anesthetic was administered. The patient was continuously monitored during the procedure by the interventional radiology nurse under my direct supervision. CONTRAST:  None COMPLICATIONS: None immediate. PROCEDURE: RADIATION DOSE REDUCTION: This exam was performed according to the departmental dose-optimization program which includes automated exposure control, adjustment of the mA and/or kV according to patient size and/or use of iterative reconstruction technique. Informed written consent was obtained from the patient and/or patient's representative after a discussion of the risks, benefits and alternatives to treatment. The patient was placed prone on the CT gantry and a pre procedural CT was performed re-demonstrating the known abscess/fluid collection within the LEFT ischial soft tissues. The procedure was planned. A timeout was performed prior to the initiation of the procedure. The LEFT gluteus was prepped and draped in the usual sterile fashion. The overlying soft tissues were  anesthetized with 1% lidocaine with epinephrine. Appropriate trajectory was planned with the use of a 22 gauge spinal needle. An 18 gauge trocar needle was advanced into the abscess/fluid collection and attempted aspiration was performed. Justin [name] significant residual fluid was obtained, therefore lavaged with 5 mL sterile saline solution was performed. 3 mL of serosanguineous fluid was aspirated. The needle was removed and dressing was placed. The patient tolerated the procedure well without immediate post procedural complication. IMPRESSION: Successful CT guided aspiration of a small LEFT ischial abscess, as described above. Samples were sent to the laboratory as requested by the ordering clinical team. Roanna Banning, MD [facility] Electronically Signed   By: Roanna Banning M.D.   On: 03/30/2023 18:24   VAS [place] LOWER EXTREMITY VENOUS (DVT) (ONLY [facility] & [facility])  Result Date: 03/29/2023  Lower Venous DVT Study Patient Name:  Justin [name]  Date of Exam:   03/29/2023 Medical Rec #: [id number]       Accession #:    [id number] Date of Birth: 08/09/1945       Patient Gender: M Patient Age:   65 years Exam Location:  [facility] Procedure:      VAS [place] LOWER EXTREMITY VENOUS (DVT) Referring Phys: Ernie Avena --------------------------------------------------------------------------------  Indications: Edema, and Hypotension and headache today prior to receiving dialysis.  Limitations: Patient somnolence, edema of calves, unable to remove pants, and inability to keep leg on bed. Comparison Study: Justin [name] prior study on file Performing Technologist: Sherren Kerns RVS  Examination Guidelines: A complete evaluation includes B-mode imaging, spectral Doppler, color Doppler, and power Doppler as needed of all accessible portions  Physician Discharge Summary  Justin [name]:[id number] DOB: [birth year] DOA: 04/12/2023  Justin [name]: Justin [name], Justin [name]  Admit date: 04/12/2023  Discharge date: 04/21/2023  Admitted From: Home.  Disposition:  [facility] rehab  Recommendations for Outpatient Follow-up:  Follow up with Justin [name] in 1-2 weeks. Please obtain BMP/CBC in one week. Advised to follow-up with Nephrology as scheduled. Patient is being discharged to [facility] Patient will continue Fortaz infusion during hemodialysis for 6 weeks. Follow-up infectious diseases as scheduled in 4 weeks. Patient has Kleb bacteremia and ischial osteomyelitis   Home Health:None Equipment/Devices:TDC  Discharge Condition: Stable CODE STATUS:Full code Diet recommendation: Heart Healthy  Brief [facility] Course: This 77 year old male with history of ESRD on HD, GIB, rectal abscess, cholecystitis with percutaneous drain, diabetes mellitus type 2 diet-controlled, chronic hypotension on midodrine, ambulatory dysfunction/wheelchair-bound for the last 3-4 months was admitted to the hospital last month for ESRD and septic shock.  Patient was noted to have 3.5 cm ischial abscess which was  drained by IR, cultures were negative, He has completed antibiotic course in the hospital and was discharged on September 26.  Patient now presented back to the hospital again with persistent left groin pain.  IR reviewed images, there is Justin [name] obvious area requiring drainage.  Currently on empiric antibiotics. ID on board.  Patient underwent line holiday on 04/16/2023 due to bacteremia. Status post repeat [facility] catheter placement on 04/19/2023 after line holiday.  Infectious disease recommended IV antibiotics during hemodialysis for 6 weeks.  Patient will get Fortaz antibiotics during hemodialysis days.  Patient is being discharged to [facility] rehab, hemodialysis outpatient set up has been completed.  Patient will start hemodialysis outpatient from tomorrow.  Nephrology signed  off.  Patient is being discharged.    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   CT scan of the left hip with small fluid collection but after reviewing by IR it is not sufficient for any sort of aspiration therefore recommending IV antibiotics.  ID on board and recommend dialysis catheter holiday.  Repeat blood culture from 04/15/2023 negative in 5 days.  Currently on Fortaz.   Plan for 6 weeks of IV antibiotics during HD as per infectious disease. Sepsis physiology improving.  Patient will get IV antibiotics during hemodialysis.   Hypoglycemia : Improved.     Paroxysmal atrial fibrillation with RVR (HCC): Continue amiodarone, digoxin, Eliquis.  Rate controlled at this time.   ESRD Monday Wednesday Friday  hemodialysis : Continue midodrine for chronic hypotension.  Nephrology on board for hemodialysis needs.    TDC was removed 04/16/23 for line holiday through the weekend.   Status post  new hemodialysis catheter placement on 04/19/2023.   Outpatient hemodialysis has been set up.  Patient will resume hemodialysis from tomorrow   History of cholecystitis status post percutaneous cholecystostomy tube in place. Status post percutaneous cholecystostomy tube in place changed to regularly at [facility].   Not a surgical candidate as per previous evaluation, nursing staff reported some leakage.   Asked to see if he is getting adequately flushed and change the dressing and see if it soaks  Physician Discharge Summary  Justin [name]:[id number] DOB: [birth year] DOA: 04/12/2023  Justin [name]: Justin [name], Justin [name]  Admit date: 04/12/2023  Discharge date: 04/21/2023  Admitted From: Home.  Disposition:  [facility] rehab  Recommendations for Outpatient Follow-up:  Follow up with Justin [name] in 1-2 weeks. Please obtain BMP/CBC in one week. Advised to follow-up with Nephrology as scheduled. Patient is being discharged to [facility] Patient will continue Fortaz infusion during hemodialysis for 6 weeks. Follow-up infectious diseases as scheduled in 4 weeks. Patient has Kleb bacteremia and ischial osteomyelitis   Home Health:None Equipment/Devices:TDC  Discharge Condition: Stable CODE STATUS:Full code Diet recommendation: Heart Healthy  Brief [facility] Course: This 77 year old male with history of ESRD on HD, GIB, rectal abscess, cholecystitis with percutaneous drain, diabetes mellitus type 2 diet-controlled, chronic hypotension on midodrine, ambulatory dysfunction/wheelchair-bound for the last 3-4 months was admitted to the hospital last month for ESRD and septic shock.  Patient was noted to have 3.5 cm ischial abscess which was  drained by IR, cultures were negative, He has completed antibiotic course in the hospital and was discharged on September 26.  Patient now presented back to the hospital again with persistent left groin pain.  IR reviewed images, there is Justin [name] obvious area requiring drainage.  Currently on empiric antibiotics. ID on board.  Patient underwent line holiday on 04/16/2023 due to bacteremia. Status post repeat [facility] catheter placement on 04/19/2023 after line holiday.  Infectious disease recommended IV antibiotics during hemodialysis for 6 weeks.  Patient will get Fortaz antibiotics during hemodialysis days.  Patient is being discharged to [facility] rehab, hemodialysis outpatient set up has been completed.  Patient will start hemodialysis outpatient from tomorrow.  Nephrology signed  off.  Patient is being discharged.    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   CT scan of the left hip with small fluid collection but after reviewing by IR it is not sufficient for any sort of aspiration therefore recommending IV antibiotics.  ID on board and recommend dialysis catheter holiday.  Repeat blood culture from 04/15/2023 negative in 5 days.  Currently on Fortaz.   Plan for 6 weeks of IV antibiotics during HD as per infectious disease. Sepsis physiology improving.  Patient will get IV antibiotics during hemodialysis.   Hypoglycemia : Improved.     Paroxysmal atrial fibrillation with RVR (HCC): Continue amiodarone, digoxin, Eliquis.  Rate controlled at this time.   ESRD Monday Wednesday Friday  hemodialysis : Continue midodrine for chronic hypotension.  Nephrology on board for hemodialysis needs.    TDC was removed 04/16/23 for line holiday through the weekend.   Status post  new hemodialysis catheter placement on 04/19/2023.   Outpatient hemodialysis has been set up.  Patient will resume hemodialysis from tomorrow   History of cholecystitis status post percutaneous cholecystostomy tube in place. Status post percutaneous cholecystostomy tube in place changed to regularly at [facility].   Not a surgical candidate as per previous evaluation, nursing staff reported some leakage.   Asked to see if he is getting adequately flushed and change the dressing and see if it soaks  Physician Discharge Summary  Justin [name]:[id number] DOB: [birth year] DOA: 04/12/2023  Justin [name]: Justin [name], Justin [name]  Admit date: 04/12/2023  Discharge date: 04/21/2023  Admitted From: Home.  Disposition:  [facility] rehab  Recommendations for Outpatient Follow-up:  Follow up with Justin [name] in 1-2 weeks. Please obtain BMP/CBC in one week. Advised to follow-up with Nephrology as scheduled. Patient is being discharged to [facility] Patient will continue Fortaz infusion during hemodialysis for 6 weeks. Follow-up infectious diseases as scheduled in 4 weeks. Patient has Kleb bacteremia and ischial osteomyelitis   Home Health:None Equipment/Devices:TDC  Discharge Condition: Stable CODE STATUS:Full code Diet recommendation: Heart Healthy  Brief [facility] Course: This 77 year old male with history of ESRD on HD, GIB, rectal abscess, cholecystitis with percutaneous drain, diabetes mellitus type 2 diet-controlled, chronic hypotension on midodrine, ambulatory dysfunction/wheelchair-bound for the last 3-4 months was admitted to the hospital last month for ESRD and septic shock.  Patient was noted to have 3.5 cm ischial abscess which was  drained by IR, cultures were negative, He has completed antibiotic course in the hospital and was discharged on September 26.  Patient now presented back to the hospital again with persistent left groin pain.  IR reviewed images, there is Justin [name] obvious area requiring drainage.  Currently on empiric antibiotics. ID on board.  Patient underwent line holiday on 04/16/2023 due to bacteremia. Status post repeat [facility] catheter placement on 04/19/2023 after line holiday.  Infectious disease recommended IV antibiotics during hemodialysis for 6 weeks.  Patient will get Fortaz antibiotics during hemodialysis days.  Patient is being discharged to [facility] rehab, hemodialysis outpatient set up has been completed.  Patient will start hemodialysis outpatient from tomorrow.  Nephrology signed  off.  Patient is being discharged.    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   CT scan of the left hip with small fluid collection but after reviewing by IR it is not sufficient for any sort of aspiration therefore recommending IV antibiotics.  ID on board and recommend dialysis catheter holiday.  Repeat blood culture from 04/15/2023 negative in 5 days.  Currently on Fortaz.   Plan for 6 weeks of IV antibiotics during HD as per infectious disease. Sepsis physiology improving.  Patient will get IV antibiotics during hemodialysis.   Hypoglycemia : Improved.     Paroxysmal atrial fibrillation with RVR (HCC): Continue amiodarone, digoxin, Eliquis.  Rate controlled at this time.   ESRD Monday Wednesday Friday  hemodialysis : Continue midodrine for chronic hypotension.  Nephrology on board for hemodialysis needs.    TDC was removed 04/16/23 for line holiday through the weekend.   Status post  new hemodialysis catheter placement on 04/19/2023.   Outpatient hemodialysis has been set up.  Patient will resume hemodialysis from tomorrow   History of cholecystitis status post percutaneous cholecystostomy tube in place. Status post percutaneous cholecystostomy tube in place changed to regularly at [facility].   Not a surgical candidate as per previous evaluation, nursing staff reported some leakage.   Asked to see if he is getting adequately flushed and change the dressing and see if it soaks  Physician Discharge Summary  Justin [name]:[id number] DOB: [birth year] DOA: 04/12/2023  Justin [name]: Justin [name], Justin [name]  Admit date: 04/12/2023  Discharge date: 04/21/2023  Admitted From: Home.  Disposition:  [facility] rehab  Recommendations for Outpatient Follow-up:  Follow up with Justin [name] in 1-2 weeks. Please obtain BMP/CBC in one week. Advised to follow-up with Nephrology as scheduled. Patient is being discharged to [facility] Patient will continue Fortaz infusion during hemodialysis for 6 weeks. Follow-up infectious diseases as scheduled in 4 weeks. Patient has Kleb bacteremia and ischial osteomyelitis   Home Health:None Equipment/Devices:TDC  Discharge Condition: Stable CODE STATUS:Full code Diet recommendation: Heart Healthy  Brief [facility] Course: This 77 year old male with history of ESRD on HD, GIB, rectal abscess, cholecystitis with percutaneous drain, diabetes mellitus type 2 diet-controlled, chronic hypotension on midodrine, ambulatory dysfunction/wheelchair-bound for the last 3-4 months was admitted to the hospital last month for ESRD and septic shock.  Patient was noted to have 3.5 cm ischial abscess which was  drained by IR, cultures were negative, He has completed antibiotic course in the hospital and was discharged on September 26.  Patient now presented back to the hospital again with persistent left groin pain.  IR reviewed images, there is Justin [name] obvious area requiring drainage.  Currently on empiric antibiotics. ID on board.  Patient underwent line holiday on 04/16/2023 due to bacteremia. Status post repeat [facility] catheter placement on 04/19/2023 after line holiday.  Infectious disease recommended IV antibiotics during hemodialysis for 6 weeks.  Patient will get Fortaz antibiotics during hemodialysis days.  Patient is being discharged to [facility] rehab, hemodialysis outpatient set up has been completed.  Patient will start hemodialysis outpatient from tomorrow.  Nephrology signed  off.  Patient is being discharged.    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   CT scan of the left hip with small fluid collection but after reviewing by IR it is not sufficient for any sort of aspiration therefore recommending IV antibiotics.  ID on board and recommend dialysis catheter holiday.  Repeat blood culture from 04/15/2023 negative in 5 days.  Currently on Fortaz.   Plan for 6 weeks of IV antibiotics during HD as per infectious disease. Sepsis physiology improving.  Patient will get IV antibiotics during hemodialysis.   Hypoglycemia : Improved.     Paroxysmal atrial fibrillation with RVR (HCC): Continue amiodarone, digoxin, Eliquis.  Rate controlled at this time.   ESRD Monday Wednesday Friday  hemodialysis : Continue midodrine for chronic hypotension.  Nephrology on board for hemodialysis needs.    TDC was removed 04/16/23 for line holiday through the weekend.   Status post  new hemodialysis catheter placement on 04/19/2023.   Outpatient hemodialysis has been set up.  Patient will resume hemodialysis from tomorrow   History of cholecystitis status post percutaneous cholecystostomy tube in place. Status post percutaneous cholecystostomy tube in place changed to regularly at [facility].   Not a surgical candidate as per previous evaluation, nursing staff reported some leakage.   Asked to see if he is getting adequately flushed and change the dressing and see if it soaks  Physician Discharge Summary  Justin [name]:[id number] DOB: [birth year] DOA: 04/12/2023  Justin [name]: Justin [name], Justin [name]  Admit date: 04/12/2023  Discharge date: 04/21/2023  Admitted From: Home.  Disposition:  [facility] rehab  Recommendations for Outpatient Follow-up:  Follow up with Justin [name] in 1-2 weeks. Please obtain BMP/CBC in one week. Advised to follow-up with Nephrology as scheduled. Patient is being discharged to [facility] Patient will continue Fortaz infusion during hemodialysis for 6 weeks. Follow-up infectious diseases as scheduled in 4 weeks. Patient has Kleb bacteremia and ischial osteomyelitis   Home Health:None Equipment/Devices:TDC  Discharge Condition: Stable CODE STATUS:Full code Diet recommendation: Heart Healthy  Brief [facility] Course: This 77 year old male with history of ESRD on HD, GIB, rectal abscess, cholecystitis with percutaneous drain, diabetes mellitus type 2 diet-controlled, chronic hypotension on midodrine, ambulatory dysfunction/wheelchair-bound for the last 3-4 months was admitted to the hospital last month for ESRD and septic shock.  Patient was noted to have 3.5 cm ischial abscess which was  drained by IR, cultures were negative, He has completed antibiotic course in the hospital and was discharged on September 26.  Patient now presented back to the hospital again with persistent left groin pain.  IR reviewed images, there is Justin [name] obvious area requiring drainage.  Currently on empiric antibiotics. ID on board.  Patient underwent line holiday on 04/16/2023 due to bacteremia. Status post repeat [facility] catheter placement on 04/19/2023 after line holiday.  Infectious disease recommended IV antibiotics during hemodialysis for 6 weeks.  Patient will get Fortaz antibiotics during hemodialysis days.  Patient is being discharged to [facility] rehab, hemodialysis outpatient set up has been completed.  Patient will start hemodialysis outpatient from tomorrow.  Nephrology signed  off.  Patient is being discharged.    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   CT scan of the left hip with small fluid collection but after reviewing by IR it is not sufficient for any sort of aspiration therefore recommending IV antibiotics.  ID on board and recommend dialysis catheter holiday.  Repeat blood culture from 04/15/2023 negative in 5 days.  Currently on Fortaz.   Plan for 6 weeks of IV antibiotics during HD as per infectious disease. Sepsis physiology improving.  Patient will get IV antibiotics during hemodialysis.   Hypoglycemia : Improved.     Paroxysmal atrial fibrillation with RVR (HCC): Continue amiodarone, digoxin, Eliquis.  Rate controlled at this time.   ESRD Monday Wednesday Friday  hemodialysis : Continue midodrine for chronic hypotension.  Nephrology on board for hemodialysis needs.    TDC was removed 04/16/23 for line holiday through the weekend.   Status post  new hemodialysis catheter placement on 04/19/2023.   Outpatient hemodialysis has been set up.  Patient will resume hemodialysis from tomorrow   History of cholecystitis status post percutaneous cholecystostomy tube in place. Status post percutaneous cholecystostomy tube in place changed to regularly at [facility].   Not a surgical candidate as per previous evaluation, nursing staff reported some leakage.   Asked to see if he is getting adequately flushed and change the dressing and see if it soaks  cervical spine: Normal marrow signal. Sinuses/Orbits: Negative. Other: None. IMPRESSION: 1. Justin [name] acute intracranial abnormality. 2. Findings of chronic microvascular ischemia. Electronically Signed   By: Deatra Robinson M.D.   On: 04/12/2023  23:23   DG Chest Portable 1 View  Result Date: 04/12/2023 CLINICAL DATA:  Pacemaker EXAM: PORTABLE CHEST 1 VIEW COMPARISON:  03/29/2023. FINDINGS: Unremarkable cardiac silhouette. Right-sided hemodialysis catheter tip at the RA/SVC junction. Alveolar opacity left base consistent with an early pneumonia or volume loss. Calcified aorta. Justin [name] pneumothorax. Justin [name] pleural effusion. IMPRESSION: Early/mild left base consolidation or volume loss. 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 Electronically Signed   By: Layla Maw M.D.   On: 04/12/2023 19:40   CT GUIDED NEEDLE PLACEMENT  Result Date: 03/30/2023 INDICATION: [id number] Abscess [id number] EXAM: CT-GUIDED ASPIRATION OF LEFT ISCHIAL ABSCESS COMPARISON:  CT AP, 03/29/2023. MEDICATIONS: The patient is currently admitted to the hospital and receiving intravenous antibiotics. The antibiotics were administered within an appropriate time frame prior to the initiation of the procedure. ANESTHESIA/SEDATION: Local anesthetic was administered. The patient was continuously monitored during the procedure by the interventional radiology nurse under my direct supervision. CONTRAST:  None COMPLICATIONS: None immediate. PROCEDURE: RADIATION DOSE REDUCTION: This exam was performed according to the departmental dose-optimization program which includes automated exposure control, adjustment of the mA and/or kV according to patient size and/or use of iterative reconstruction technique. Informed written consent was obtained from the patient and/or patient's representative after a discussion of the risks, benefits and alternatives to treatment. The patient was placed prone on the CT gantry and a pre procedural CT was performed re-demonstrating the known abscess/fluid collection within the LEFT ischial soft tissues. The procedure was planned. A timeout was performed prior to the initiation of the procedure. The LEFT gluteus was prepped and draped in the usual sterile fashion. The overlying soft tissues were  anesthetized with 1% lidocaine with epinephrine. Appropriate trajectory was planned with the use of a 22 gauge spinal needle. An 18 gauge trocar needle was advanced into the abscess/fluid collection and attempted aspiration was performed. Justin [name] significant residual fluid was obtained, therefore lavaged with 5 mL sterile saline solution was performed. 3 mL of serosanguineous fluid was aspirated. The needle was removed and dressing was placed. The patient tolerated the procedure well without immediate post procedural complication. IMPRESSION: Successful CT guided aspiration of a small LEFT ischial abscess, as described above. Samples were sent to the laboratory as requested by the ordering clinical team. Roanna Banning, MD [facility] Electronically Signed   By: Roanna Banning M.D.   On: 03/30/2023 18:24   VAS [place] LOWER EXTREMITY VENOUS (DVT) (ONLY [facility] & [facility])  Result Date: 03/29/2023  Lower Venous DVT Study Patient Name:  Justin [name]  Date of Exam:   03/29/2023 Medical Rec #: [id number]       Accession #:    [id number] Date of Birth: 08/09/1945       Patient Gender: M Patient Age:   65 years Exam Location:  [facility] Procedure:      VAS [place] LOWER EXTREMITY VENOUS (DVT) Referring Phys: Ernie Avena --------------------------------------------------------------------------------  Indications: Edema, and Hypotension and headache today prior to receiving dialysis.  Limitations: Patient somnolence, edema of calves, unable to remove pants, and inability to keep leg on bed. Comparison Study: Justin [name] prior study on file Performing Technologist: Sherren Kerns RVS  Examination Guidelines: A complete evaluation includes B-mode imaging, spectral Doppler, color Doppler, and power Doppler as needed of all accessible portions  Physician Discharge Summary  Justin [name]:[id number] DOB: [birth year] DOA: 04/12/2023  Justin [name]: Justin [name], Justin [name]  Admit date: 04/12/2023  Discharge date: 04/21/2023  Admitted From: Home.  Disposition:  [facility] rehab  Recommendations for Outpatient Follow-up:  Follow up with Justin [name] in 1-2 weeks. Please obtain BMP/CBC in one week. Advised to follow-up with Nephrology as scheduled. Patient is being discharged to [facility] Patient will continue Fortaz infusion during hemodialysis for 6 weeks. Follow-up infectious diseases as scheduled in 4 weeks. Patient has Kleb bacteremia and ischial osteomyelitis   Home Health:None Equipment/Devices:TDC  Discharge Condition: Stable CODE STATUS:Full code Diet recommendation: Heart Healthy  Brief [facility] Course: This 77 year old male with history of ESRD on HD, GIB, rectal abscess, cholecystitis with percutaneous drain, diabetes mellitus type 2 diet-controlled, chronic hypotension on midodrine, ambulatory dysfunction/wheelchair-bound for the last 3-4 months was admitted to the hospital last month for ESRD and septic shock.  Patient was noted to have 3.5 cm ischial abscess which was  drained by IR, cultures were negative, He has completed antibiotic course in the hospital and was discharged on September 26.  Patient now presented back to the hospital again with persistent left groin pain.  IR reviewed images, there is Justin [name] obvious area requiring drainage.  Currently on empiric antibiotics. ID on board.  Patient underwent line holiday on 04/16/2023 due to bacteremia. Status post repeat [facility] catheter placement on 04/19/2023 after line holiday.  Infectious disease recommended IV antibiotics during hemodialysis for 6 weeks.  Patient will get Fortaz antibiotics during hemodialysis days.  Patient is being discharged to [facility] rehab, hemodialysis outpatient set up has been completed.  Patient will start hemodialysis outpatient from tomorrow.  Nephrology signed  off.  Patient is being discharged.    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   CT scan of the left hip with small fluid collection but after reviewing by IR it is not sufficient for any sort of aspiration therefore recommending IV antibiotics.  ID on board and recommend dialysis catheter holiday.  Repeat blood culture from 04/15/2023 negative in 5 days.  Currently on Fortaz.   Plan for 6 weeks of IV antibiotics during HD as per infectious disease. Sepsis physiology improving.  Patient will get IV antibiotics during hemodialysis.   Hypoglycemia : Improved.     Paroxysmal atrial fibrillation with RVR (HCC): Continue amiodarone, digoxin, Eliquis.  Rate controlled at this time.   ESRD Monday Wednesday Friday  hemodialysis : Continue midodrine for chronic hypotension.  Nephrology on board for hemodialysis needs.    TDC was removed 04/16/23 for line holiday through the weekend.   Status post  new hemodialysis catheter placement on 04/19/2023.   Outpatient hemodialysis has been set up.  Patient will resume hemodialysis from tomorrow   History of cholecystitis status post percutaneous cholecystostomy tube in place. Status post percutaneous cholecystostomy tube in place changed to regularly at [facility].   Not a surgical candidate as per previous evaluation, nursing staff reported some leakage.   Asked to see if he is getting adequately flushed and change the dressing and see if it soaks  Physician Discharge Summary  Justin [name]:[id number] DOB: [birth year] DOA: 04/12/2023  Justin [name]: Justin [name], Justin [name]  Admit date: 04/12/2023  Discharge date: 04/21/2023  Admitted From: Home.  Disposition:  [facility] rehab  Recommendations for Outpatient Follow-up:  Follow up with Justin [name] in 1-2 weeks. Please obtain BMP/CBC in one week. Advised to follow-up with Nephrology as scheduled. Patient is being discharged to [facility] Patient will continue Fortaz infusion during hemodialysis for 6 weeks. Follow-up infectious diseases as scheduled in 4 weeks. Patient has Kleb bacteremia and ischial osteomyelitis   Home Health:None Equipment/Devices:TDC  Discharge Condition: Stable CODE STATUS:Full code Diet recommendation: Heart Healthy  Brief [facility] Course: This 77 year old male with history of ESRD on HD, GIB, rectal abscess, cholecystitis with percutaneous drain, diabetes mellitus type 2 diet-controlled, chronic hypotension on midodrine, ambulatory dysfunction/wheelchair-bound for the last 3-4 months was admitted to the hospital last month for ESRD and septic shock.  Patient was noted to have 3.5 cm ischial abscess which was  drained by IR, cultures were negative, He has completed antibiotic course in the hospital and was discharged on September 26.  Patient now presented back to the hospital again with persistent left groin pain.  IR reviewed images, there is Justin [name] obvious area requiring drainage.  Currently on empiric antibiotics. ID on board.  Patient underwent line holiday on 04/16/2023 due to bacteremia. Status post repeat [facility] catheter placement on 04/19/2023 after line holiday.  Infectious disease recommended IV antibiotics during hemodialysis for 6 weeks.  Patient will get Fortaz antibiotics during hemodialysis days.  Patient is being discharged to [facility] rehab, hemodialysis outpatient set up has been completed.  Patient will start hemodialysis outpatient from tomorrow.  Nephrology signed  off.  Patient is being discharged.    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   CT scan of the left hip with small fluid collection but after reviewing by IR it is not sufficient for any sort of aspiration therefore recommending IV antibiotics.  ID on board and recommend dialysis catheter holiday.  Repeat blood culture from 04/15/2023 negative in 5 days.  Currently on Fortaz.   Plan for 6 weeks of IV antibiotics during HD as per infectious disease. Sepsis physiology improving.  Patient will get IV antibiotics during hemodialysis.   Hypoglycemia : Improved.     Paroxysmal atrial fibrillation with RVR (HCC): Continue amiodarone, digoxin, Eliquis.  Rate controlled at this time.   ESRD Monday Wednesday Friday  hemodialysis : Continue midodrine for chronic hypotension.  Nephrology on board for hemodialysis needs.    TDC was removed 04/16/23 for line holiday through the weekend.   Status post  new hemodialysis catheter placement on 04/19/2023.   Outpatient hemodialysis has been set up.  Patient will resume hemodialysis from tomorrow   History of cholecystitis status post percutaneous cholecystostomy tube in place. Status post percutaneous cholecystostomy tube in place changed to regularly at [facility].   Not a surgical candidate as per previous evaluation, nursing staff reported some leakage.   Asked to see if he is getting adequately flushed and change the dressing and see if it soaks  Physician Discharge Summary  Justin [name]:[id number] DOB: [birth year] DOA: 04/12/2023  Justin [name]: Justin [name], Justin [name]  Admit date: 04/12/2023  Discharge date: 04/21/2023  Admitted From: Home.  Disposition:  [facility] rehab  Recommendations for Outpatient Follow-up:  Follow up with Justin [name] in 1-2 weeks. Please obtain BMP/CBC in one week. Advised to follow-up with Nephrology as scheduled. Patient is being discharged to [facility] Patient will continue Fortaz infusion during hemodialysis for 6 weeks. Follow-up infectious diseases as scheduled in 4 weeks. Patient has Kleb bacteremia and ischial osteomyelitis   Home Health:None Equipment/Devices:TDC  Discharge Condition: Stable CODE STATUS:Full code Diet recommendation: Heart Healthy  Brief [facility] Course: This 77 year old male with history of ESRD on HD, GIB, rectal abscess, cholecystitis with percutaneous drain, diabetes mellitus type 2 diet-controlled, chronic hypotension on midodrine, ambulatory dysfunction/wheelchair-bound for the last 3-4 months was admitted to the hospital last month for ESRD and septic shock.  Patient was noted to have 3.5 cm ischial abscess which was  drained by IR, cultures were negative, He has completed antibiotic course in the hospital and was discharged on September 26.  Patient now presented back to the hospital again with persistent left groin pain.  IR reviewed images, there is Justin [name] obvious area requiring drainage.  Currently on empiric antibiotics. ID on board.  Patient underwent line holiday on 04/16/2023 due to bacteremia. Status post repeat [facility] catheter placement on 04/19/2023 after line holiday.  Infectious disease recommended IV antibiotics during hemodialysis for 6 weeks.  Patient will get Fortaz antibiotics during hemodialysis days.  Patient is being discharged to [facility] rehab, hemodialysis outpatient set up has been completed.  Patient will start hemodialysis outpatient from tomorrow.  Nephrology signed  off.  Patient is being discharged.    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   CT scan of the left hip with small fluid collection but after reviewing by IR it is not sufficient for any sort of aspiration therefore recommending IV antibiotics.  ID on board and recommend dialysis catheter holiday.  Repeat blood culture from 04/15/2023 negative in 5 days.  Currently on Fortaz.   Plan for 6 weeks of IV antibiotics during HD as per infectious disease. Sepsis physiology improving.  Patient will get IV antibiotics during hemodialysis.   Hypoglycemia : Improved.     Paroxysmal atrial fibrillation with RVR (HCC): Continue amiodarone, digoxin, Eliquis.  Rate controlled at this time.   ESRD Monday Wednesday Friday  hemodialysis : Continue midodrine for chronic hypotension.  Nephrology on board for hemodialysis needs.    TDC was removed 04/16/23 for line holiday through the weekend.   Status post  new hemodialysis catheter placement on 04/19/2023.   Outpatient hemodialysis has been set up.  Patient will resume hemodialysis from tomorrow   History of cholecystitis status post percutaneous cholecystostomy tube in place. Status post percutaneous cholecystostomy tube in place changed to regularly at [facility].   Not a surgical candidate as per previous evaluation, nursing staff reported some leakage.   Asked to see if he is getting adequately flushed and change the dressing and see if it soaks  cervical spine: Normal marrow signal. Sinuses/Orbits: Negative. Other: None. IMPRESSION: 1. Justin [name] acute intracranial abnormality. 2. Findings of chronic microvascular ischemia. Electronically Signed   By: Deatra Robinson M.D.   On: 04/12/2023  23:23   DG Chest Portable 1 View  Result Date: 04/12/2023 CLINICAL DATA:  Pacemaker EXAM: PORTABLE CHEST 1 VIEW COMPARISON:  03/29/2023. FINDINGS: Unremarkable cardiac silhouette. Right-sided hemodialysis catheter tip at the RA/SVC junction. Alveolar opacity left base consistent with an early pneumonia or volume loss. Calcified aorta. Justin [name] pneumothorax. Justin [name] pleural effusion. IMPRESSION: Early/mild left base consolidation or volume loss. 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 Electronically Signed   By: Layla Maw M.D.   On: 04/12/2023 19:40   CT GUIDED NEEDLE PLACEMENT  Result Date: 03/30/2023 INDICATION: 14782 Abscess [id number] EXAM: CT-GUIDED ASPIRATION OF LEFT ISCHIAL ABSCESS COMPARISON:  CT AP, 03/29/2023. MEDICATIONS: The patient is currently admitted to the hospital and receiving intravenous antibiotics. The antibiotics were administered within an appropriate time frame prior to the initiation of the procedure. ANESTHESIA/SEDATION: Local anesthetic was administered. The patient was continuously monitored during the procedure by the interventional radiology nurse under my direct supervision. CONTRAST:  None COMPLICATIONS: None immediate. PROCEDURE: RADIATION DOSE REDUCTION: This exam was performed according to the departmental dose-optimization program which includes automated exposure control, adjustment of the mA and/or kV according to patient size and/or use of iterative reconstruction technique. Informed written consent was obtained from the patient and/or patient's representative after a discussion of the risks, benefits and alternatives to treatment. The patient was placed prone on the CT gantry and a pre procedural CT was performed re-demonstrating the known abscess/fluid collection within the LEFT ischial soft tissues. The procedure was planned. A timeout was performed prior to the initiation of the procedure. The LEFT gluteus was prepped and draped in the usual sterile fashion. The overlying soft tissues were  anesthetized with 1% lidocaine with epinephrine. Appropriate trajectory was planned with the use of a 22 gauge spinal needle. An 18 gauge trocar needle was advanced into the abscess/fluid collection and attempted aspiration was performed. Justin [name] significant residual fluid was obtained, therefore lavaged with 5 mL sterile saline solution was performed. 3 mL of serosanguineous fluid was aspirated. The needle was removed and dressing was placed. The patient tolerated the procedure well without immediate post procedural complication. IMPRESSION: Successful CT guided aspiration of a small LEFT ischial abscess, as described above. Samples were sent to the laboratory as requested by the ordering clinical team. Roanna Banning, MD [facility] Electronically Signed   By: Roanna Banning M.D.   On: 03/30/2023 18:24   VAS [place] LOWER EXTREMITY VENOUS (DVT) (ONLY [facility] & [facility])  Result Date: 03/29/2023  Lower Venous DVT Study Patient Name:  Justin [name]  Date of Exam:   03/29/2023 Medical Rec #: [id number]       Accession #:    [id number] Date of Birth: 08/09/1945       Patient Gender: M Patient Age:   65 years Exam Location:  [facility] Procedure:      VAS [place] LOWER EXTREMITY VENOUS (DVT) Referring Phys: Ernie Avena --------------------------------------------------------------------------------  Indications: Edema, and Hypotension and headache today prior to receiving dialysis.  Limitations: Patient somnolence, edema of calves, unable to remove pants, and inability to keep leg on bed. Comparison Study: Justin [name] prior study on file Performing Technologist: Sherren Kerns RVS  Examination Guidelines: A complete evaluation includes B-mode imaging, spectral Doppler, color Doppler, and power Doppler as needed of all accessible portions  cervical spine: Normal marrow signal. Sinuses/Orbits: Negative. Other: None. IMPRESSION: 1. Justin [name] acute intracranial abnormality. 2. Findings of chronic microvascular ischemia. Electronically Signed   By: Deatra Robinson M.D.   On: 04/12/2023  23:23   DG Chest Portable 1 View  Result Date: 04/12/2023 CLINICAL DATA:  Pacemaker EXAM: PORTABLE CHEST 1 VIEW COMPARISON:  03/29/2023. FINDINGS: Unremarkable cardiac silhouette. Right-sided hemodialysis catheter tip at the RA/SVC junction. Alveolar opacity left base consistent with an early pneumonia or volume loss. Calcified aorta. Justin [name] pneumothorax. Justin [name] pleural effusion. IMPRESSION: Early/mild left base consolidation or volume loss. 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 Electronically Signed   By: Layla Maw M.D.   On: 04/12/2023 19:40   CT GUIDED NEEDLE PLACEMENT  Result Date: 03/30/2023 INDICATION: 14782 Abscess [id number] EXAM: CT-GUIDED ASPIRATION OF LEFT ISCHIAL ABSCESS COMPARISON:  CT AP, 03/29/2023. MEDICATIONS: The patient is currently admitted to the hospital and receiving intravenous antibiotics. The antibiotics were administered within an appropriate time frame prior to the initiation of the procedure. ANESTHESIA/SEDATION: Local anesthetic was administered. The patient was continuously monitored during the procedure by the interventional radiology nurse under my direct supervision. CONTRAST:  None COMPLICATIONS: None immediate. PROCEDURE: RADIATION DOSE REDUCTION: This exam was performed according to the departmental dose-optimization program which includes automated exposure control, adjustment of the mA and/or kV according to patient size and/or use of iterative reconstruction technique. Informed written consent was obtained from the patient and/or patient's representative after a discussion of the risks, benefits and alternatives to treatment. The patient was placed prone on the CT gantry and a pre procedural CT was performed re-demonstrating the known abscess/fluid collection within the LEFT ischial soft tissues. The procedure was planned. A timeout was performed prior to the initiation of the procedure. The LEFT gluteus was prepped and draped in the usual sterile fashion. The overlying soft tissues were  anesthetized with 1% lidocaine with epinephrine. Appropriate trajectory was planned with the use of a 22 gauge spinal needle. An 18 gauge trocar needle was advanced into the abscess/fluid collection and attempted aspiration was performed. Justin [name] significant residual fluid was obtained, therefore lavaged with 5 mL sterile saline solution was performed. 3 mL of serosanguineous fluid was aspirated. The needle was removed and dressing was placed. The patient tolerated the procedure well without immediate post procedural complication. IMPRESSION: Successful CT guided aspiration of a small LEFT ischial abscess, as described above. Samples were sent to the laboratory as requested by the ordering clinical team. Roanna Banning, MD [facility] Electronically Signed   By: Roanna Banning M.D.   On: 03/30/2023 18:24   VAS [place] LOWER EXTREMITY VENOUS (DVT) (ONLY [facility])  Result Date: 03/29/2023  Lower Venous DVT Study Patient Name:  Justin [name]  Date of Exam:   03/29/2023 Medical Rec #: [id number]       Accession #:    [id number] Date of Birth: 08/09/1945       Patient Gender: M Patient Age:   65 years Exam Location:  [facility] Procedure:      VAS [place] LOWER EXTREMITY VENOUS (DVT) Referring Phys: Ernie Avena --------------------------------------------------------------------------------  Indications: Edema, and Hypotension and headache today prior to receiving dialysis.  Limitations: Patient somnolence, edema of calves, unable to remove pants, and inability to keep leg on bed. Comparison Study: Justin [name] prior study on file Performing Technologist: Sherren Kerns RVS  Examination Guidelines: A complete evaluation includes B-mode imaging, spectral Doppler, color Doppler, and power Doppler as needed of all accessible portions  cervical spine: Normal marrow signal. Sinuses/Orbits: Negative. Other: None. IMPRESSION: 1. Justin [name] acute intracranial abnormality. 2. Findings of chronic microvascular ischemia. Electronically Signed   By: Deatra Robinson M.D.   On: 04/12/2023  23:23   DG Chest Portable 1 View  Result Date: 04/12/2023 CLINICAL DATA:  Pacemaker EXAM: PORTABLE CHEST 1 VIEW COMPARISON:  03/29/2023. FINDINGS: Unremarkable cardiac silhouette. Right-sided hemodialysis catheter tip at the RA/SVC junction. Alveolar opacity left base consistent with an early pneumonia or volume loss. Calcified aorta. Justin [name] pneumothorax. Justin [name] pleural effusion. IMPRESSION: Early/mild left base consolidation or volume loss. 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 Electronically Signed   By: Layla Maw M.D.   On: 04/12/2023 19:40   CT GUIDED NEEDLE PLACEMENT  Result Date: 03/30/2023 INDICATION: 14782 Abscess [id number] EXAM: CT-GUIDED ASPIRATION OF LEFT ISCHIAL ABSCESS COMPARISON:  CT AP, 03/29/2023. MEDICATIONS: The patient is currently admitted to the hospital and receiving intravenous antibiotics. The antibiotics were administered within an appropriate time frame prior to the initiation of the procedure. ANESTHESIA/SEDATION: Local anesthetic was administered. The patient was continuously monitored during the procedure by the interventional radiology nurse under my direct supervision. CONTRAST:  None COMPLICATIONS: None immediate. PROCEDURE: RADIATION DOSE REDUCTION: This exam was performed according to the departmental dose-optimization program which includes automated exposure control, adjustment of the mA and/or kV according to patient size and/or use of iterative reconstruction technique. Informed written consent was obtained from the patient and/or patient's representative after a discussion of the risks, benefits and alternatives to treatment. The patient was placed prone on the CT gantry and a pre procedural CT was performed re-demonstrating the known abscess/fluid collection within the LEFT ischial soft tissues. The procedure was planned. A timeout was performed prior to the initiation of the procedure. The LEFT gluteus was prepped and draped in the usual sterile fashion. The overlying soft tissues were  anesthetized with 1% lidocaine with epinephrine. Appropriate trajectory was planned with the use of a 22 gauge spinal needle. An 18 gauge trocar needle was advanced into the abscess/fluid collection and attempted aspiration was performed. Justin [name] significant residual fluid was obtained, therefore lavaged with 5 mL sterile saline solution was performed. 3 mL of serosanguineous fluid was aspirated. The needle was removed and dressing was placed. The patient tolerated the procedure well without immediate post procedural complication. IMPRESSION: Successful CT guided aspiration of a small LEFT ischial abscess, as described above. Samples were sent to the laboratory as requested by the ordering clinical team. Roanna Banning, MD [facility] Electronically Signed   By: Roanna Banning M.D.   On: 03/30/2023 18:24   VAS [place] LOWER EXTREMITY VENOUS (DVT) (ONLY [facility])  Result Date: 03/29/2023  Lower Venous DVT Study Patient Name:  Justin [name]  Date of Exam:   03/29/2023 Medical Rec #: [id number]       Accession #:    [id number] Date of Birth: 08/09/1945       Patient Gender: M Patient Age:   65 years Exam Location:  [facility] Procedure:      VAS [place] LOWER EXTREMITY VENOUS (DVT) Referring Phys: Ernie Avena --------------------------------------------------------------------------------  Indications: Edema, and Hypotension and headache today prior to receiving dialysis.  Limitations: Patient somnolence, edema of calves, unable to remove pants, and inability to keep leg on bed. Comparison Study: Justin [name] prior study on file Performing Technologist: Sherren Kerns RVS  Examination Guidelines: A complete evaluation includes B-mode imaging, spectral Doppler, color Doppler, and power Doppler as needed of all accessible portions  cervical spine: Normal marrow signal. Sinuses/Orbits: Negative. Other: None. IMPRESSION: 1. Justin [name] acute intracranial abnormality. 2. Findings of chronic microvascular ischemia. Electronically Signed   By: Deatra Robinson M.D.   On: 04/12/2023  23:23   DG Chest Portable 1 View  Result Date: 04/12/2023 CLINICAL DATA:  Pacemaker EXAM: PORTABLE CHEST 1 VIEW COMPARISON:  03/29/2023. FINDINGS: Unremarkable cardiac silhouette. Right-sided hemodialysis catheter tip at the RA/SVC junction. Alveolar opacity left base consistent with an early pneumonia or volume loss. Calcified aorta. Justin [name] pneumothorax. Justin [name] pleural effusion. IMPRESSION: Early/mild left base consolidation or volume loss. 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 Electronically Signed   By: Layla Maw M.D.   On: 04/12/2023 19:40   CT GUIDED NEEDLE PLACEMENT  Result Date: 03/30/2023 INDICATION: 14782 Abscess [id number] EXAM: CT-GUIDED ASPIRATION OF LEFT ISCHIAL ABSCESS COMPARISON:  CT AP, 03/29/2023. MEDICATIONS: The patient is currently admitted to the hospital and receiving intravenous antibiotics. The antibiotics were administered within an appropriate time frame prior to the initiation of the procedure. ANESTHESIA/SEDATION: Local anesthetic was administered. The patient was continuously monitored during the procedure by the interventional radiology nurse under my direct supervision. CONTRAST:  None COMPLICATIONS: None immediate. PROCEDURE: RADIATION DOSE REDUCTION: This exam was performed according to the departmental dose-optimization program which includes automated exposure control, adjustment of the mA and/or kV according to patient size and/or use of iterative reconstruction technique. Informed written consent was obtained from the patient and/or patient's representative after a discussion of the risks, benefits and alternatives to treatment. The patient was placed prone on the CT gantry and a pre procedural CT was performed re-demonstrating the known abscess/fluid collection within the LEFT ischial soft tissues. The procedure was planned. A timeout was performed prior to the initiation of the procedure. The LEFT gluteus was prepped and draped in the usual sterile fashion. The overlying soft tissues were  anesthetized with 1% lidocaine with epinephrine. Appropriate trajectory was planned with the use of a 22 gauge spinal needle. An 18 gauge trocar needle was advanced into the abscess/fluid collection and attempted aspiration was performed. Justin [name] significant residual fluid was obtained, therefore lavaged with 5 mL sterile saline solution was performed. 3 mL of serosanguineous fluid was aspirated. The needle was removed and dressing was placed. The patient tolerated the procedure well without immediate post procedural complication. IMPRESSION: Successful CT guided aspiration of a small LEFT ischial abscess, as described above. Samples were sent to the laboratory as requested by the ordering clinical team. Roanna Banning, MD [facility] Radiology Electronically Signed   By: Roanna Banning M.D.   On: 03/30/2023 18:24   VAS [place] LOWER EXTREMITY VENOUS (DVT) (ONLY [facility])  Result Date: 03/29/2023  Lower Venous DVT Study Patient Name:  Justin [name]  Date of Exam:   03/29/2023 Medical Rec #: [id number]       Accession #:    [id number] Date of Birth: 08/09/1945       Patient Gender: M Patient Age:   65 years Exam Location:  [facility] Procedure:      VAS [place] LOWER EXTREMITY VENOUS (DVT) Referring Phys: Ernie Avena --------------------------------------------------------------------------------  Indications: Edema, and Hypotension and headache today prior to receiving dialysis.  Limitations: Patient somnolence, edema of calves, unable to remove pants, and inability to keep leg on bed. Comparison Study: Justin [name] prior study on file Performing Technologist: Sherren Kerns RVS  Examination Guidelines: A complete evaluation includes B-mode imaging, spectral Doppler, color Doppler, and power Doppler as needed of all accessible portions  cervical spine: Normal marrow signal. Sinuses/Orbits: Negative. Other: None. IMPRESSION: 1. Justin [name] acute intracranial abnormality. 2. Findings of chronic microvascular ischemia. Electronically Signed   By: Deatra Robinson M.D.   On: 04/12/2023  23:23   DG Chest Portable 1 View  Result Date: 04/12/2023 CLINICAL DATA:  Pacemaker EXAM: PORTABLE CHEST 1 VIEW COMPARISON:  03/29/2023. FINDINGS: Unremarkable cardiac silhouette. Right-sided hemodialysis catheter tip at the RA/SVC junction. Alveolar opacity left base consistent with an early pneumonia or volume loss. Calcified aorta. Justin [name] pneumothorax. Justin [name] pleural effusion. IMPRESSION: Early/mild left base consolidation or volume loss. 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 Electronically Signed   By: Layla Maw M.D.   On: 04/12/2023 19:40   CT GUIDED NEEDLE PLACEMENT  Result Date: 03/30/2023 INDICATION: 14782 Abscess [id number] EXAM: CT-GUIDED ASPIRATION OF LEFT ISCHIAL ABSCESS COMPARISON:  CT AP, 03/29/2023. MEDICATIONS: The patient is currently admitted to the hospital and receiving intravenous antibiotics. The antibiotics were administered within an appropriate time frame prior to the initiation of the procedure. ANESTHESIA/SEDATION: Local anesthetic was administered. The patient was continuously monitored during the procedure by the interventional radiology nurse under my direct supervision. CONTRAST:  None COMPLICATIONS: None immediate. PROCEDURE: RADIATION DOSE REDUCTION: This exam was performed according to the departmental dose-optimization program which includes automated exposure control, adjustment of the mA and/or kV according to patient size and/or use of iterative reconstruction technique. Informed written consent was obtained from the patient and/or patient's representative after a discussion of the risks, benefits and alternatives to treatment. The patient was placed prone on the CT gantry and a pre procedural CT was performed re-demonstrating the known abscess/fluid collection within the LEFT ischial soft tissues. The procedure was planned. A timeout was performed prior to the initiation of the procedure. The LEFT gluteus was prepped and draped in the usual sterile fashion. The overlying soft tissues were  anesthetized with 1% lidocaine with epinephrine. Appropriate trajectory was planned with the use of a 22 gauge spinal needle. An 18 gauge trocar needle was advanced into the abscess/fluid collection and attempted aspiration was performed. Justin [name] significant residual fluid was obtained, therefore lavaged with 5 mL sterile saline solution was performed. 3 mL of serosanguineous fluid was aspirated. The needle was removed and dressing was placed. The patient tolerated the procedure well without immediate post procedural complication. IMPRESSION: Successful CT guided aspiration of a small LEFT ischial abscess, as described above. Samples were sent to the laboratory as requested by the ordering clinical team. Roanna Banning, MD [facility] Electronically Signed   By: Roanna Banning M.D.   On: 03/30/2023 18:24   VAS [place] LOWER EXTREMITY VENOUS (DVT) (ONLY [facility])  Result Date: 03/29/2023  Lower Venous DVT Study Patient Name:  Justin [name]  Date of Exam:   03/29/2023 Medical Rec #: [id number]       Accession #:    [id number] Date of Birth: 08/09/1945       Patient Gender: M Patient Age:   65 years Exam Location:  [facility] Procedure:      VAS [place] LOWER EXTREMITY VENOUS (DVT) Referring Phys: Ernie Avena --------------------------------------------------------------------------------  Indications: Edema, and Hypotension and headache today prior to receiving dialysis.  Limitations: Patient somnolence, edema of calves, unable to remove pants, and inability to keep leg on bed. Comparison Study: Justin [name] prior study on file Performing Technologist: Sherren Kerns RVS  Examination Guidelines: A complete evaluation includes B-mode imaging, spectral Doppler, color Doppler, and power Doppler as needed of all accessible portions  Physician Discharge Summary  Justin [name]:[id number] DOB: [birth year] DOA: 04/12/2023  Justin [name]: Justin [name], Justin [name]  Admit date: 04/12/2023  Discharge date: 04/21/2023  Admitted From: Home.  Disposition:  [facility] rehab  Recommendations for Outpatient Follow-up:  Follow up with Justin [name] in 1-2 weeks. Please obtain BMP/CBC in one week. Advised to follow-up with Nephrology as scheduled. Patient is being discharged to [facility] Patient will continue Fortaz infusion during hemodialysis for 6 weeks. Follow-up infectious diseases as scheduled in 4 weeks. Patient has Kleb bacteremia and ischial osteomyelitis   Home Health:None Equipment/Devices:TDC  Discharge Condition: Stable CODE STATUS:Full code Diet recommendation: Heart Healthy  Brief [facility] Course: This 77 year old male with history of ESRD on HD, GIB, rectal abscess, cholecystitis with percutaneous drain, diabetes mellitus type 2 diet-controlled, chronic hypotension on midodrine, ambulatory dysfunction/wheelchair-bound for the last 3-4 months was admitted to the hospital last month for ESRD and septic shock.  Patient was noted to have 3.5 cm ischial abscess which was  drained by IR, cultures were negative, He has completed antibiotic course in the hospital and was discharged on September 26.  Patient now presented back to the hospital again with persistent left groin pain.  IR reviewed images, there is Justin [name] obvious area requiring drainage.  Currently on empiric antibiotics. ID on board.  Patient underwent line holiday on 04/16/2023 due to bacteremia. Status post repeat [facility] catheter placement on 04/19/2023 after line holiday.  Infectious disease recommended IV antibiotics during hemodialysis for 6 weeks.  Patient will get Fortaz antibiotics during hemodialysis days.  Patient is being discharged to [facility] rehab, hemodialysis outpatient set up has been completed.  Patient will start hemodialysis outpatient from tomorrow.  Nephrology signed  off.  Patient is being discharged.    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   CT scan of the left hip with small fluid collection but after reviewing by IR it is not sufficient for any sort of aspiration therefore recommending IV antibiotics.  ID on board and recommend dialysis catheter holiday.  Repeat blood culture from 04/15/2023 negative in 5 days.  Currently on Fortaz.   Plan for 6 weeks of IV antibiotics during HD as per infectious disease. Sepsis physiology improving.  Patient will get IV antibiotics during hemodialysis.   Hypoglycemia : Improved.     Paroxysmal atrial fibrillation with RVR (HCC): Continue amiodarone, digoxin, Eliquis.  Rate controlled at this time.   ESRD Monday Wednesday Friday  hemodialysis : Continue midodrine for chronic hypotension.  Nephrology on board for hemodialysis needs.    TDC was removed 04/16/23 for line holiday through the weekend.   Status post  new hemodialysis catheter placement on 04/19/2023.   Outpatient hemodialysis has been set up.  Patient will resume hemodialysis from tomorrow   History of cholecystitis status post percutaneous cholecystostomy tube in place. Status post percutaneous cholecystostomy tube in place changed to regularly at [facility].   Not a surgical candidate as per previous evaluation, nursing staff reported some leakage.   Asked to see if he is getting adequately flushed and change the dressing and see if it soaks  cervical spine: Normal marrow signal. Sinuses/Orbits: Negative. Other: None. IMPRESSION: 1. Justin [name] acute intracranial abnormality. 2. Findings of chronic microvascular ischemia. Electronically Signed   By: Deatra Robinson M.D.   On: 04/12/2023  23:23   DG Chest Portable 1 View  Result Date: 04/12/2023 CLINICAL DATA:  Pacemaker EXAM: PORTABLE CHEST 1 VIEW COMPARISON:  03/29/2023. FINDINGS: Unremarkable cardiac silhouette. Right-sided hemodialysis catheter tip at the RA/SVC junction. Alveolar opacity left base consistent with an early pneumonia or volume loss. Calcified aorta. Justin [name] pneumothorax. Justin [name] pleural effusion. IMPRESSION: Early/mild left base consolidation or volume loss. 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 Electronically Signed   By: Layla Maw M.D.   On: 04/12/2023 19:40   CT GUIDED NEEDLE PLACEMENT  Result Date: 03/30/2023 INDICATION: [id number] Abscess [id number] EXAM: CT-GUIDED ASPIRATION OF LEFT ISCHIAL ABSCESS COMPARISON:  CT AP, 03/29/2023. MEDICATIONS: The patient is currently admitted to the hospital and receiving intravenous antibiotics. The antibiotics were administered within an appropriate time frame prior to the initiation of the procedure. ANESTHESIA/SEDATION: Local anesthetic was administered. The patient was continuously monitored during the procedure by the interventional radiology nurse under my direct supervision. CONTRAST:  None COMPLICATIONS: None immediate. PROCEDURE: RADIATION DOSE REDUCTION: This exam was performed according to the departmental dose-optimization program which includes automated exposure control, adjustment of the mA and/or kV according to patient size and/or use of iterative reconstruction technique. Informed written consent was obtained from the patient and/or patient's representative after a discussion of the risks, benefits and alternatives to treatment. The patient was placed prone on the CT gantry and a pre procedural CT was performed re-demonstrating the known abscess/fluid collection within the LEFT ischial soft tissues. The procedure was planned. A timeout was performed prior to the initiation of the procedure. The LEFT gluteus was prepped and draped in the usual sterile fashion. The overlying soft tissues were  anesthetized with 1% lidocaine with epinephrine. Appropriate trajectory was planned with the use of a 22 gauge spinal needle. An 18 gauge trocar needle was advanced into the abscess/fluid collection and attempted aspiration was performed. Justin [name] significant residual fluid was obtained, therefore lavaged with 5 mL sterile saline solution was performed. 3 mL of serosanguineous fluid was aspirated. The needle was removed and dressing was placed. The patient tolerated the procedure well without immediate post procedural complication. IMPRESSION: Successful CT guided aspiration of a small LEFT ischial abscess, as described above. Samples were sent to the laboratory as requested by the ordering clinical team. Roanna Banning, MD [facility] Electronically Signed   By: Roanna Banning M.D.   On: 03/30/2023 18:24   VAS [place] LOWER EXTREMITY VENOUS (DVT) (ONLY [facility] & [facility])  Result Date: 03/29/2023  Lower Venous DVT Study Patient Name:  Justin [name]  Date of Exam:   03/29/2023 Medical Rec #: [id number]       Accession #:    [id number] Date of Birth: 08/09/1945       Patient Gender: M Patient Age:   65 years Exam Location:  [facility] Procedure:      VAS [place] LOWER EXTREMITY VENOUS (DVT) Referring Phys: Ernie Avena --------------------------------------------------------------------------------  Indications: Edema, and Hypotension and headache today prior to receiving dialysis.  Limitations: Patient somnolence, edema of calves, unable to remove pants, and inability to keep leg on bed. Comparison Study: Justin [name] prior study on file Performing Technologist: Sherren Kerns RVS  Examination Guidelines: A complete evaluation includes B-mode imaging, spectral Doppler, color Doppler, and power Doppler as needed of all accessible portions  Physician Discharge Summary  Justin [name]:[id number] DOB: [birth year] DOA: 04/12/2023  Justin [name]: Justin [name], Justin [name]  Admit date: 04/12/2023  Discharge date: 04/21/2023  Admitted From: Home.  Disposition:  [facility] rehab  Recommendations for Outpatient Follow-up:  Follow up with Justin [name] in 1-2 weeks. Please obtain BMP/CBC in one week. Advised to follow-up with Nephrology as scheduled. Patient is being discharged to [facility] Patient will continue Fortaz infusion during hemodialysis for 6 weeks. Follow-up infectious diseases as scheduled in 4 weeks. Patient has Kleb bacteremia and ischial osteomyelitis   Home Health:None Equipment/Devices:TDC  Discharge Condition: Stable CODE STATUS:Full code Diet recommendation: Heart Healthy  Brief [facility] Course: This 77 year old male with history of ESRD on HD, GIB, rectal abscess, cholecystitis with percutaneous drain, diabetes mellitus type 2 diet-controlled, chronic hypotension on midodrine, ambulatory dysfunction/wheelchair-bound for the last 3-4 months was admitted to the hospital last month for ESRD and septic shock.  Patient was noted to have 3.5 cm ischial abscess which was  drained by IR, cultures were negative, He has completed antibiotic course in the hospital and was discharged on September 26.  Patient now presented back to the hospital again with persistent left groin pain.  IR reviewed images, there is Justin [name] obvious area requiring drainage.  Currently on empiric antibiotics. ID on board.  Patient underwent line holiday on 04/16/2023 due to bacteremia. Status post repeat [facility] catheter placement on 04/19/2023 after line holiday.  Infectious disease recommended IV antibiotics during hemodialysis for 6 weeks.  Patient will get Fortaz antibiotics during hemodialysis days.  Patient is being discharged to [facility] rehab, hemodialysis outpatient set up has been completed.  Patient will start hemodialysis outpatient from tomorrow.  Nephrology signed  off.  Patient is being discharged.    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   CT scan of the left hip with small fluid collection but after reviewing by IR it is not sufficient for any sort of aspiration therefore recommending IV antibiotics.  ID on board and recommend dialysis catheter holiday.  Repeat blood culture from 04/15/2023 negative in 5 days.  Currently on Fortaz.   Plan for 6 weeks of IV antibiotics during HD as per infectious disease. Sepsis physiology improving.  Patient will get IV antibiotics during hemodialysis.   Hypoglycemia : Improved.     Paroxysmal atrial fibrillation with RVR (HCC): Continue amiodarone, digoxin, Eliquis.  Rate controlled at this time.   ESRD Monday Wednesday Friday  hemodialysis : Continue midodrine for chronic hypotension.  Nephrology on board for hemodialysis needs.    TDC was removed 04/16/23 for line holiday through the weekend.   Status post  new hemodialysis catheter placement on 04/19/2023.   Outpatient hemodialysis has been set up.  Patient will resume hemodialysis from tomorrow   History of cholecystitis status post percutaneous cholecystostomy tube in place. Status post percutaneous cholecystostomy tube in place changed to regularly at [facility].   Not a surgical candidate as per previous evaluation, nursing staff reported some leakage.   Asked to see if he is getting adequately flushed and change the dressing and see if it soaks

## 2023-04-21 NOTE — Plan of Care (Signed)
  Problem: Activity: Goal: Ability to tolerate increased activity will improve Outcome: Progressing   Problem: Respiratory: Goal: Ability to maintain a clear airway and adequate ventilation will improve Outcome: Progressing   Problem: Role Relationship: Goal: Method of communication will improve Outcome: Progressing   Problem: Education: Goal: Knowledge of General Education information will improve Description: Including pain rating scale, medication(s)/side effects and non-pharmacologic comfort measures Outcome: Progressing   Problem: Health Behavior/Discharge Planning: Goal: Ability to manage health-related needs will improve Outcome: Progressing   Problem: Clinical Measurements: Goal: Ability to maintain clinical measurements within normal limits will improve Outcome: Progressing

## 2023-04-21 NOTE — Progress Notes (Signed)
Occupational Therapy Treatment Patient Details Name: Justin [name] MRN: [id number] DOB: Jun 22, 1946 Today's Date: 04/21/2023   History of present illness 77 y.o. male presents to [facility] hospital on 04/12/2023 with persistent L groin pain. Pt recently admitted in September for management of sepsis and ischial abscess. PMH includes ESRD on HD, GIB, rectal abscess, cholecystitis with drain, DMII, chronic hypotension, ambulatory dysfunction.   OT comments  Pt eager to discharge to SNF. Reports he learned to walk after his last rehab stay at the same SNF. Total assist at bed level for pericare. Sat EOB and participated in grooming with supervision. Stood from elevated bed, briefly x 1 with mod-max assist. Returned to supine with assist for LEs back into bed. Patient will benefit from continued inpatient follow up therapy, <3 hours/day       If plan is discharge home, recommend the following:  Two people to help with walking and/or transfers;A lot of help with bathing/dressing/bathroom;Assistance with cooking/housework;Assist for transportation;Help with stairs or ramp for entrance   Equipment Recommendations  Other (comment) (defer)    Recommendations for Other Services      Precautions / Restrictions Precautions Precautions: Fall Precaution Comments: RLQ cholecystitis drain Required Braces or Orthoses: Other Brace Other Brace: B post op shoes Restrictions Weight Bearing Restrictions: No       Mobility Bed Mobility Overal bed mobility: Needs Assistance Bed Mobility: Supine to Sit, Sit to Supine     Supine to sit: Used rails, Supervision Sit to supine: Mod assist   General bed mobility comments: assist for LEs back into bed    Transfers Overall transfer level: Needs assistance Equipment used: Rolling walker (2 wheels) Transfers: Sit to/from Stand Sit to Stand: Mod assist, From elevated surface           General transfer comment: stood x 2 with maximum effort     Balance  Overall balance assessment: Needs assistance   Sitting balance-Leahy Scale: Fair       Standing balance-Leahy Scale: Zero Standing balance comment: max assist and heavy reliance on walker, maintains to count of 5                           ADL either performed or assessed with clinical judgement   ADL Overall ADL's : Needs assistance/impaired     Grooming: Wash/dry hands;Wash/dry face;Oral care;Sitting;Supervision/safety                       Toileting- Clothing Manipulation and Hygiene: Total assistance;Bed level              Extremity/Trunk Assessment              Vision       Perception     Praxis      Cognition Arousal: Alert Behavior During Therapy: WFL for tasks assessed/performed Overall Cognitive Status: Within Functional Limits for tasks assessed                                          Exercises      Shoulder Instructions       General Comments      Pertinent Vitals/ Pain       Pain Assessment Pain Assessment: Faces Faces Pain Scale: Hurts little more Pain Location: B LEs Pain Intervention(s): Limited activity within patient's tolerance, Monitored during session,  Premedicated before session  Home Living                                          Prior Functioning/Environment              Frequency  Min 1X/week        Progress Toward Goals  OT Goals(current goals can now be found in the care plan section)  Progress towards OT goals: Progressing toward goals  Acute Rehab OT Goals OT Goal Formulation: With patient Time For Goal Achievement: 04/29/23 Potential to Achieve Goals: Good  Plan      Co-evaluation                 AM-PAC OT "6 Clicks" Daily Activity     Outcome Measure   Help from another person eating meals?: None Help from another person taking care of personal grooming?: A Little Help from another person toileting, which includes using toliet,  bedpan, or urinal?: Total Help from another person bathing (including washing, rinsing, drying)?: A Lot Help from another person to put on and taking off regular upper body clothing?: A Little Help from another person to put on and taking off regular lower body clothing?: Total 6 Click Score: 14    End of Session Equipment Utilized During Treatment: Gait belt;Rolling walker (2 wheels)  OT Visit Diagnosis: Unsteadiness on feet (R26.81);Pain;Muscle weakness (generalized) (M62.81)   Activity Tolerance Patient tolerated treatment well   Patient Left in bed;with call bell/phone within reach;with bed alarm set   Nurse Communication          Time: 4098-1191 OT Time Calculation (min): 17 min  Charges: OT General Charges $OT Visit: 1 Visit OT Treatments $Self Care/Home Management : 8-22 mins  Berna Spare, OTR/L Acute Rehabilitation Services Office: [phone]   Evern Bio 04/21/2023, 12:44 PM

## 2023-04-21 NOTE — Progress Notes (Signed)
[facility] Progress Note   Subjective:   Patient seen and examined at bedside. Eating breakfast.  Not happy about getting changed to TTS schedule as outpatient. No HD today due to plan to start at outpatient clinic tomorrow.   Objective Vitals:   04/20/23 1543 04/21/23 0500 04/21/23 0522 04/21/23 0738  BP: 95/66  122/72 115/72  Pulse: 86  (!) 57 77  Resp:   18 18  Temp: 97.7 F (36.5 C)  97.9 F (36.6 C) 97.7 F (36.5 C)  TempSrc: Oral  Oral Oral  SpO2: 94%  99% 97%  Weight:  79.8 kg    Height:       Physical Exam General:Chronically ill appearing male in NAD Heart:RRR, no mrg Lungs:CTAB, nml WOB on RA Abdomen:soft, NTND Extremities:no LE edema Dialysis Access: [facility]   Filed Weights   04/20/23 0817 04/20/23 1200 04/21/23 0500  Weight: 80.9 kg 80.5 kg 79.8 kg    Intake/Output Summary (Last 24 hours) at 04/21/2023 1443 Last data filed at 04/21/2023 0833 Gross per 24 hour  Intake 104.9 ml  Output 550 ml  Net -445.1 ml    Additional Objective Labs: Basic Metabolic Panel: Recent Labs  Lab 04/16/23 0616 04/18/23 0614 04/20/23 0930  NA 134* 134* 134*  K 3.4* 4.0 4.1  CL 94* 95* 95*  CO2 26 25 25   GLUCOSE 96 79 73  BUN 35* 25* 37*  CREATININE 7.68* 7.01* 8.96*  CALCIUM 8.2* 8.0* 7.9*  PHOS  --   --  5.2*   Liver Function Tests: Recent Labs  Lab 04/20/23 0930  ALBUMIN 2.0*    CBC: Recent Labs  Lab 04/16/23 0616 04/18/23 0614 04/20/23 0930  WBC 6.3 8.0 7.2  HGB 9.1* 9.6* 9.3*  HCT 29.2* 31.1* 30.3*  MCV 94.5 94.5 93.8  PLT 215 234 241    Medications:  cefTAZidime (FORTAZ)  IV 1 g (04/19/23 1830)    amiodarone  200 mg Oral Daily   apixaban  2.5 mg Oral BID   atorvastatin  40 mg Oral QHS   calcitRIOL  0.25 mcg Oral Daily   Chlorhexidine Gluconate Cloth  6 each Topical Q0600   cinacalcet  30 mg Oral Q supper   darbepoetin (ARANESP) injection - NON-DIALYSIS  60 mcg Subcutaneous Q Mon-1800   digoxin  0.0625 mg Oral QODAY    heparin sodium (porcine)  4.2 mL Intravenous Once   midodrine  20 mg Oral TID WC   pregabalin  75 mg Oral Daily   sevelamer carbonate  800 mg Oral TID WC    Dialysis Orders: [facility]  3h  450/1.5    82kg  2/2.5 bath  TDC   Heparin none - last OP HD 9/27, post wt 82.3kg   CXR 9/30 - no active disease   Assessment/Plan: GNR bacteremia / acute L hip osteomyelitis/ hip pain - On IV rocephin and po flagyl.  ID planning 6 week total abx course until Fortaz 2g IV qHD until 05/27/23. Pt has TDC in place. ID has requested line holiday, IR removed TDC on 10/4, replaced 10.7 by IR. Appreciate their assistance.  Afib / RVR - per pmd ESRD - now on TTS at new clinic.  HD on new TTS schedule yesterday.  Next HD tomorrow as outpatient.  BP/ chronic hypotension - takes midodrine 20 tid at home, continued here. BP's stable. Volume - Does not appear volume overloaded. Under his dry weight, likely weight loss and needs to be lowered on d/c.  UF as tolerated.  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 Start aranesp qwk yesterday.   MBD ckd - CCa and phos controlled. Cont sensipar, po vdra and renvela as binder.  Nutrition - renal diet w/fluid restrictions when no longer NPO.  Dispo - Going to SNF @ [employer] and HD at [facility].   Virgina Norfolk, PA-C [facility] 04/21/2023,2:43 PM  LOS: 8 days

## 2023-04-22 ENCOUNTER — Inpatient Hospital Stay (HOSPITAL_COMMUNITY)
Admission: RE | Admit: 2023-04-22 | Discharge: 2023-04-22 | Disposition: A | Discharge status: Home / Self Care | Payer: Medicare Other | Source: Ambulatory Visit | Attending: Interventional Radiology | Admitting: Interventional Radiology

## 2023-04-22 ENCOUNTER — Telehealth (HOSPITAL_COMMUNITY): Payer: Self-pay

## 2023-04-22 ENCOUNTER — Telehealth: Payer: Self-pay | Admitting: Gastroenterology

## 2023-04-22 MED ORDER — NA SULFATE-K SULFATE-MG SULF 17.5-3.13-1.6 GM/177ML PO SOLN: 1.0000 | Freq: Once | ORAL | 0 refills | Status: AC

## 2023-04-22 NOTE — Telephone Encounter (Signed)
The instructions have been faxed and prescription has been sent as requested

## 2023-04-22 NOTE — Telephone Encounter (Signed)
[name], can you reschedule Justin [name] for a couple weeks for his routine chole drain exchange??? he just got discharged yesterday and Dr Elby Showers wanted to push him out a couple weeks. Thank you!

## 2023-04-22 NOTE — Telephone Encounter (Signed)
[facility] called and PT needs to have Suprep sent to [facility] in [place]. As well as have instructions sent to email- [email or web address] or faxed to [phone]

## 2023-04-23 ENCOUNTER — Other Ambulatory Visit: Payer: Self-pay

## 2023-04-23 MED ORDER — NA SULFATE-K SULFATE-MG SULF 17.5-3.13-1.6 GM/177ML PO SOLN: 1.0000 | Freq: Once | ORAL | 0 refills | Status: AC

## 2023-04-23 NOTE — Telephone Encounter (Signed)
All information has been refaxed to [phone] Prescription has been resent to [phone]

## 2023-04-23 NOTE — Telephone Encounter (Signed)
Inbound call from [facility] in regards to previous note. Pharmacy still has not received prescription.  Please advise.

## 2023-04-28 ENCOUNTER — Encounter (HOSPITAL_COMMUNITY): Payer: Self-pay | Admitting: Gastroenterology

## 2023-04-28 ENCOUNTER — Other Ambulatory Visit: Payer: Self-pay

## 2023-04-28 NOTE — Progress Notes (Signed)
Pre op call eval Name:Justin [name]   PCP-Venugopal K [name]- Octavio Manns, [place] Cardiologist-Harwani MD  EKG-04/13/23 Echo-11/24/22 Cath-n/a Mosetta Putt ICD/PM-n/a Blood thinner-Eliquis GLP-1- n/a  Hx: Diabetic, ESRD, HTN, Anemia, Afib, Septic shock, Rectal Abcess. Recently d/c from hospital 10/9 from septic shock d/t ischial abcess. Also has a chole drain from cholecystitis. Was seen while in hospital by Dr Sharyn Lull 9/26. Was from home but now since last d/c , needing a lot of care and IV antibiotics was transferred to a rehab facility.  Anesthesia Review: Yes     Preop instructions for: Justin [name]   Date of Birth:  22-Aug-2045 Date of Procedure:  05/05/2023 Procedure:   Colonoscopy with Endoscopic Mucosal Resection, Endoscopic Ultrasound Surgeon: Dr.Mansouraty Facility contact:   [facility]  Phone:   [phone] RN contact name/phone#:       [phone]     and Fax #: [phone] Transportation contact phone#: same as above  Time to arrive at [facility]: 0630 am   Report to: Admitting - Go through main entrance of hospital and tell front desk you are having a procedure done, and they will show how to get to admitting dept.  *Follow colon prep sheet provided by office on prep instructions*  May have the following until 0400am  day of procedure  CLEAR LIQUID DIET Water Black Coffee (sugar ok, NO MILK/CREAM OR CREAMERS)  Tea (sugar ok, NO MILK/CREAM OR CREAMERS) regular and decaf                             Plain Jell-O (NO RED)                                           Fruit ices (not with fruit pulp, NO RED)                                     Popsicles (NO RED)                                                                  Juice: apple, WHITE grape, WHITE cranberry Sports drinks like Gatorade (NO RED)   Take these morning medications only with sips of water.(or give through gastrostomy or feeding tube)  Amiodarone, Digoxin(med taken every  other day if falls on day to be given, can give), Midodrine  **Eliquis is usually held for procedures, you should have received information on when to hold this by Dr.Mansouraty's office. If you did not, you will need to contact them to see when to stop medicine. If not addressed, may result in cancellation of procedure. Their number is [phone]**  Note: No Insulin or Diabetic meds should be given or taken the morning of the procedure!   Please send day of procedure: current med list and meds last taken that day, confirm nothing by mouth status from what time, Patient Demographic info( to include DNR status, problem list, allergies)Bring Insurance card and ID, can shower & use deodorant but nothing else on skin. Leave all jewelry and other  valuables at place where living ( no metal or rings to be worn) Any questions prior to procedure call pre surg nurse Lyla Son [phone]  Any questions DAY OF procedure, call ENDO [phone] Sent from :[facility] Presurgical Testing     Phone:[phone] Fax:[phone]

## 2023-04-29 ENCOUNTER — Telehealth: Payer: Self-pay

## 2023-04-29 NOTE — Telephone Encounter (Signed)
RN with [facility] called stating that Dialysis center didn't have correct OPAT orders for patient. Informed RN that per hospital discharge summary patient is to take cefTAZidime 1 g injected into the vein every Monday, Wednesday and Friday at hemodialysis.   RN repeated orders back and verbalized her understanding.   Jaclene Bartelt Lesli Albee, CMA

## 2023-04-30 NOTE — Telephone Encounter (Signed)
Received call from [facility] with [facility]. They have conflicting orders for ceftazidime. One set of orders says 1 g and one set says 2 g.   Relayed that the only orders on our end are for ceftazidime 1 gram MWF with hemodialysis. Will confirm with pharmacy team.   Lupita Leash [phone] option 1  Sandie Ano, RN

## 2023-04-30 NOTE — Telephone Encounter (Signed)
1g is correct - thank you for checking!

## 2023-04-30 NOTE — Telephone Encounter (Signed)
Relayed verbal orders to Lupita Leash at [facility] that ceftazidime dose should be 1 gram per Margarite Gouge, [facility]. Orders repeated and verified.   Sandie Ano, RN

## 2023-05-04 ENCOUNTER — Telehealth: Payer: Self-pay

## 2023-05-04 NOTE — Plan of Care (Signed)
CHL Tonsillectomy/Adenoidectomy, Postoperative PEDS care plan entered in error.

## 2023-05-04 NOTE — Progress Notes (Signed)
Patient scheduled for endo procedure 05/05/23. Documents sent to facility on 10/16 about what was needed prior to procedure to be sent back by 10/21. Also, this RN notified Dr Elesa Hacker nurse about needing cardiac clearance and letter of anticoagulation hold, the clearance letter was sent to patient's cardiologist (Dr. Sharyn Lull) on 10/16. This RN had not received any documents back by 10/22 am, so I reached out to facility and they had not heard either and had not stopped blood thinner. I asked Dr.Mansouratys team if they had heard and they said they resent the letter 10/21 because had not heard anything. As of 10/22 am, MD Mansouraty said if had not heard by 2p to cancel. Per nurse at GI she said around 2:45 no response and multiple attempts made. Per MD Mansouraty, will cancel since no cardiac clearance. Notified patient's facility of cancelling and they would receive call from facility when able to reschedule. Also notified facility nurse for next time rescheduled will at least need to be off for 48h per MD Mansouraty.

## 2023-05-04 NOTE — Telephone Encounter (Signed)
Eliquis hold needs to be a full 48 hours prior to procedure due to his kidney function. The reality is for this patient, it was the person doing his anticoagulation let us down, so we won't hold it against him this time. GM

## 2023-05-04 NOTE — Telephone Encounter (Signed)
I have resent the letter to PCP, and cardiology to attempt to get clearance for pt procedure.  I also placed a call to the office on 10/21 but had to leave a message.  Carrie at [facility] endo has been advised that we have not gotten a response.

## 2023-05-04 NOTE — Telephone Encounter (Signed)
Trula Slade, I apologize I was out sick yesterday so I'm following up today, did you ever get word from the cardiologist on when to hold Eliquis? I just got off the phone with his nurse and he received Eliquis this am.  CB Procedure tomorrow  10:44 AM I resent the letter and called and left a message with the office but have not heard anything   10:46 AM CB Beatrix Shipper, RN Okay dang, well also he ate this morning, I told the nurse keep him on clears until we heard back from the office. Dr. Meridee Score does he need to be rescheduled since he hasn't held the blood thinner or wait until hopefully cards calls back?  10:48 AM Imogene Burn, MD was added by Lemar Lofty., MD. 10:54 AM Lemar Lofty., MD He can hold the PM thinner, so I'm still ok for now. But if no call received by 200PM, then needs to be cancelled. Also, this is now the 3rd time he will need to be rescheduled, so I'm not sure that I want further slots to be given to him without me seeing him in clinic personally. Otherwise, his referral will need to go elsewhere for as many times as we have tried to schedule this man.  10:55 AM GM Hopefully you get callback today by 200 to make final decision.  10:56 AM Beatrix Shipper, RN Okay I will update the nursing facility  11:02 AM Just called in and asked if the nurse can keep him on clears until 2p and Ill call her back to confirm if a go or not. I told her earlier when we spoke to keep him on clears, she said lunch just got passed around and he had a clear tray but had some ensures at bedside that he just drank one. I told her keep him on clears until 2p.   1  12:40 PM CB Any word Kyheem Bathgate?  45 mins No I have not heard anything I called the office again and was told I would get a call as soon as able.   12 mins CB Beatrix Shipper, RN Are we waiting or cancelling Dr. Meridee Score?   10 mins GM Lemar Lofty., MD We put a timestamp and it was not  met. He should be cancelled. We need to get the request back and put this into documentation. He can be rescheduled 1 last time. I can't be losing these timeslots.  3 mins YD Imogene Burn, MD Okay thanks Gabe  3 mins Are you certain to go ahead and reschedule. I can get him an office visit so that we are sure he will be available for the procedure.   2 mins GM Lemar Lofty., MD CD saw him in clinic. I'm OK, 1 last reschedule. If it doesn't work, then he will need to be referred elsewhere.  2  2 mins Lyla Son do need to call and cancel or are you taking it off.   1 min GM Lemar Lofty., MD Reschedule direct for procedure is OK Upper EUS/Colon 2-hour slot.

## 2023-05-04 NOTE — Telephone Encounter (Signed)
Noted    GM

## 2023-05-05 ENCOUNTER — Ambulatory Visit (HOSPITAL_COMMUNITY): Admission: RE | Admit: 2023-05-05 | Payer: Medicare Other | Source: Home / Self Care | Admitting: Gastroenterology

## 2023-05-05 ENCOUNTER — Encounter (HOSPITAL_COMMUNITY): Admission: RE | Payer: Self-pay | Source: Home / Self Care

## 2023-05-05 SURGERY — COLONOSCOPY WITH PROPOFOL
Anesthesia: Monitor Anesthesia Care

## 2023-05-05 NOTE — Telephone Encounter (Signed)
Appt rescheduled to 07/12/23 at 9 am at [facility] with GM.   Left message on machine to call back

## 2023-05-05 NOTE — Telephone Encounter (Signed)
Received a fax from Dr Sharyn Lull with the ok to  hold Eliquis for 3 days prior to the procedure. I will have the response scanned into the system.

## 2023-05-05 NOTE — Telephone Encounter (Signed)
The pt has been rescheduled for 07/12/23 All information has been faxed to the pt rehab facility. [facility] at [phone]  I have also sent a letter to the cardiologist for Eliquis clearance and medical clearance.

## 2023-05-05 NOTE — Telephone Encounter (Signed)
Justin [name]   DOB :06/13/1946 MRN :[id number] Procedure Date:  07/12/23 Arrival Time:  730 am Procedure Time: 9 am     Location of Procedure: [facility] Registration  PREPARATION FOR COLONOSCOPY WITH SUPREP  On 07/07/23 (5 days prior to the procedure) stop eating nuts, seeds, popcorn, corn, beans, salads, or any raw vegetables.  Do not take any fiber supplements (e.g. Metamucil, Citrucel, and Benefiber).    THE DAY BEFORE PROCEDURE:   DATE: 07/11/23  1. Drink clear liquids the entire day - NO SOLID FOOD  2. Do not drink anything colored red or purple. Avoid juices with pulp. No orange juice, grapefruit, or tomato juice.  3. Drink at least 64 oz. (8 glasses) of fluid/clear liquids during the day to prevent dehydration and help the prep work efficiently.  CLEAR LIQUIDS INCLUDE:  Water Jello  Ice Popsicles  Tea (sugar ok, no milk/cream) Powdered fruit flavored drinks  Coffee (sugar ok, no milk/cream) Gatorade  Juice: apple, white grape, white cranberry Lemonade  Clear bullion, consomme, broth Carbonated beverages (any kind)  Strained chicken noodle soup Hard Candy   FOLLOW THESE PREP INSTRUCTIONS, NOT INSTRUCTION ON SUPREP BOX.  At 6:00pm: COMPLETE STEPS 1 THROUGH 4 BELOW:  Step 1: Pour ONE (1) 6-ounce bottle of SUPREP liquid into the mixing container. Step 2: Add cool drinking water to the 16-ounce line on the container and mix.   Step 3: Drink ALL of the 16-ounce diluted prep over 15 minutes. Step 4: You MUST drink an additional two (2) or more 16-ounce containers of water over the next one (1) hour.   Continue to drink clear liquids until bedtime.   ______________________________________________________________________   DAY OF PROCEDURE:   DATE: 07/12/23       Beginning at  4 am  (5 hours before the procedure):  COMPLETE STEPS 1 THROUGH 4 BELOW:  Step 1: Pour ONE (1) 6-ounce bottle of SUPREP liquid into the mixing container. Step 2: Add cool drinking  water to the 16-ounce line on the container and mix.   Step 3: Drink ALL of the 16-ounce diluted prep over 15 minutes.    Step 4: You MUST drink an additional two (2) or more 16-ounce containers of water over the next one (1) hour.  You may drink clear liquids until 5 am (4 hours before procedure).     MEDICATION INSTRUCTIONS  Unless otherwise instructed, you should take regular prescription medications with a small sip of water as early as possible the morning of your procedure.   Eliquis will need to be held for 48  hours prior to your procedure. We have contacted your physician to get the ok for that hold. Please let us know if you have not heard from our office within 2 weeks of the appointment date.   ______________________________________________________________________  OTHER INSTRUCTIONS  You will need a responsible adult at least 79 years of age to accompany you and drive you home. This person must remain in the waiting room during your procedure.  Wear loose fitting clothing that is easily removed.  Leave jewelry and other valuables at home. However, you may wish to bring a book to read or an iPod/MP3 player to listen to music as you wait for your procedure to start.  Remove all body piercing jewelry and leave at home.  Do not wear  red or dark nail polish.  Total time from sign-in until discharge is approximately 2-3 hours.  You should go home directly after your  procedure and rest. You can resume normal activities the day after your procedure.  The day of your procedure you should not: Drive Make legal decisions Operate machinery Drink alcohol Return to work  You will receive specific instructions about eating, activities and medications before you leave.    You may call the office [phone] before 5:00 pm and call [phone] after 5:00 pm if you are having any problems with the prep.

## 2023-05-06 ENCOUNTER — Ambulatory Visit (HOSPITAL_COMMUNITY)
Admission: RE | Admit: 2023-05-06 | Discharge: 2023-05-06 | Disposition: A | Discharge status: Home / Self Care | Payer: Medicare Other | Source: Ambulatory Visit | Attending: Interventional Radiology | Admitting: Interventional Radiology

## 2023-05-06 DIAGNOSIS — K819 Cholecystitis, unspecified: Secondary | ICD-10-CM | POA: Diagnosis not present

## 2023-05-06 DIAGNOSIS — Z434 Encounter for attention to other artificial openings of digestive tract: Secondary | ICD-10-CM | POA: Insufficient documentation

## 2023-05-06 HISTORY — PX: IR CATHETER TUBE CHANGE: IMG717

## 2023-05-06 HISTORY — PX: IR EXCHANGE BILIARY DRAIN: IMG6046

## 2023-05-06 MED ORDER — LIDOCAINE HCL 1 % IJ SOLN
INTRAMUSCULAR | Status: AC
  Filled 2023-05-06: qty 20

## 2023-05-06 MED ORDER — IOHEXOL 300 MG/ML  SOLN
50.0000 mL | Freq: Once | INTRAMUSCULAR | Status: AC | PRN
  Administered 2023-05-06: 8 mL

## 2023-05-06 MED ORDER — LIDOCAINE HCL 1 % IJ SOLN
20.0000 mL | Freq: Once | INTRAMUSCULAR | Status: AC
  Administered 2023-05-06: 10 mL

## 2023-05-06 NOTE — Procedures (Signed)
Interventional Radiology Procedure Note  Procedure: Image guided drain placement, perc chole.  66F pigtail drain.  Complications: None  EBL: None Findings: 8.44F drain in place.  Gallbladder somewhat distended with debris.  Upsize was performed for better drainage  Recommendations: - Routine drain care    - routine wound care  - routine exchange  Signed,  Yvone Neu. Loreta Ave, DO

## 2023-05-07 ENCOUNTER — Other Ambulatory Visit (HOSPITAL_COMMUNITY): Payer: Self-pay | Admitting: Interventional Radiology

## 2023-05-07 ENCOUNTER — Encounter (HOSPITAL_COMMUNITY): Payer: Self-pay

## 2023-05-07 DIAGNOSIS — K819 Cholecystitis, unspecified: Secondary | ICD-10-CM

## 2023-05-24 ENCOUNTER — Telehealth: Payer: Self-pay

## 2023-05-24 ENCOUNTER — Encounter: Payer: Self-pay | Admitting: Internal Medicine

## 2023-05-24 ENCOUNTER — Ambulatory Visit (INDEPENDENT_AMBULATORY_CARE_PROVIDER_SITE_OTHER): Payer: Medicare Other | Admitting: Internal Medicine

## 2023-05-24 ENCOUNTER — Other Ambulatory Visit: Payer: Self-pay

## 2023-05-24 VITALS — BP 97/60 | HR 61 | Temp 97.5°F

## 2023-05-24 DIAGNOSIS — E43 Unspecified severe protein-calorie malnutrition: Secondary | ICD-10-CM

## 2023-05-24 DIAGNOSIS — M8618 Other acute osteomyelitis, other site: Secondary | ICD-10-CM

## 2023-05-24 DIAGNOSIS — M869 Osteomyelitis, unspecified: Secondary | ICD-10-CM

## 2023-05-24 NOTE — Telephone Encounter (Signed)
Per Dr. Drue Second extend IV abx till 11/25. Called [facility] [phone]) and relayed orders to Lupita Leash and also faxed over orders to [phone] and received confirmation.

## 2023-05-24 NOTE — Progress Notes (Signed)
RFV: follow up on hospital for kleb bacteremia and ischial osteo  Patient ID: Justin [name], male   DOB: 09-Aug-1945, 77 y.o.   MRN: [id number]  HPI Justin [name] is a 77yo M with HTN ,ESRD on HD receiving ceftaz with hd for the past 6 wk completing on 11/14. Also has cholecystostomy drain for the past  6 months.  Ros: has decreased appetite.unintentional weight loss. Clothing loose  Outpatient Encounter Medications as of 05/24/2023  Medication Sig   acetaminophen (TYLENOL) 325 MG tablet Take 2 tablets (650 mg total) by mouth every 6 (six) hours as needed for mild pain (or Fever >/= 101).   amiodarone (PACERONE) 200 MG tablet Take 1 tablet (200 mg total) by mouth daily.   apixaban (ELIQUIS) 2.5 MG TABS tablet Take 1 tablet (2.5 mg total) by mouth 2 (two) times daily.   atorvastatin (LIPITOR) 40 MG tablet Take 40 mg by mouth at bedtime.   calcitRIOL (ROCALTROL) 0.25 MCG capsule Take 3 capsules (0.75 mcg total) by mouth every Monday, Wednesday, and Friday with hemodialysis. (Patient taking differently: Take 0.25 mcg by mouth daily.)   cefTAZidime 1 g in sodium chloride 0.9 % 100 mL Inject 1 g into the vein every Monday, Wednesday, and Friday with hemodialysis.   pantoprazole (PROTONIX) 40 MG tablet Take 1 tablet (40 mg total) by mouth 2 (two) times daily before a meal. (Patient taking differently: Take 40 mg by mouth daily.)   PERCOCET 5-325 MG tablet Take 1 tablet by mouth every 6 (six) hours as needed.   pregabalin (LYRICA) 75 MG capsule Take 1 capsule (75 mg total) by mouth at bedtime. (Patient taking differently: Take 75 mg by mouth 2 (two) times daily.)   sevelamer carbonate (RENVELA) 800 MG tablet Take 800 mg by mouth 2 (two) times daily.   Digoxin 62.5 MCG TABS Take 0.0625 mg by mouth every other day.   No facility-administered encounter medications on file as of 05/24/2023.     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   Hemorrhagic shock (HCC) 12/14/2022   Acute encephalopathy 11/09/2022   Syncope 11/09/2022   Acute blood loss anemia 11/09/2022   Melena 11/09/2022   History of colonic polyps 11/09/2022   End stage renal disease (HCC) 11/09/2022   Gastric nodule 11/09/2022   Rectal bleeding 09/17/2022   Colon ulcer 09/17/2022   Lower GI bleeding 09/17/2022   GI bleed 09/16/2022   Prolonged QT interval 09/16/2022   History of prostate cancer 09/16/2022   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   History of colon polyps 09/16/2022   Lower GI bleed 09/12/2022   Acute GI bleeding 09/10/2022   Wide-complex tachycardia 06/17/2022   Hypotension 06/13/2022   Obesity (BMI 30-39.9) 06/13/2022   Perirectal abscess 06/13/2022   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   Murmur, cardiac 01/27/2022   Chronic back pain 01/27/2022   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/27/2022   GERD (gastroesophageal reflux disease) 11/11/2021   High risk medication use 11/11/2021   History of cardioversion 11/11/2021   Paroxysmal atrial fibrillation with RVR (HCC) 11/11/2021   PVC (premature ventricular contraction) 11/11/2021   Nonrheumatic aortic valve stenosis 11/06/2021  Mixed hyperlipidemia 11/06/2021   Abdominal mass 05/05/2021   First degree AV block 07/13/2020   Paroxysmal A-fib (HCC) 01/29/2020   Wound infection 01/28/2020   H/O vitrectomy 12/07/2019   Macular pucker, 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 determined by examination associated with type 2 diabetes mellitus (HCC) 10/24/2019   Stable treated proliferative diabetic retinopathy of left eye determined by examination associated  with type 2 diabetes mellitus (HCC) 10/24/2019   ESRD on hemodialysis (HCC) 07/29/2017   Anticoagulated 07/13/2017   Malignant neoplasm of prostate (HCC) 07/12/2017   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   Coagulation defect, unspecified (HCC) 01/08/2017   Idiopathic gout 01/08/2017   Acute on chronic blood loss anemia 11/20/2015   Blurred vision, left eye 07/10/2011   Essential hypertension 07/10/2011   DM (diabetes mellitus) (HCC) 07/10/2011     Health Maintenance Due  Topic Date Due   Medicare Annual Wellness (AWV)  Never done   Pneumonia Vaccine 24+ Years old (1 of 2 - PCV) Never done   FOOT EXAM  Never done   OPHTHALMOLOGY EXAM  Never done   Hepatitis C Screening  Never done   DTaP/Tdap/Td (1 - Tdap) Never done   Zoster Vaccines- Shingrix (1 of 2) Never done   INFLUENZA VACCINE  02/11/2023   COVID-19 Vaccine (4 - 2023-24 season) 03/14/2023     Review of Systems 12 point ros is negative Physical Exam   BP 97/60   Pulse 61   Temp (!) 97.5 F (36.4 C) (Temporal)   SpO2 97%  Physical Exam  Constitutional: He is oriented to person, place, and time. He appears well-developed and well-nourished. No distress.  HENT:  Mouth/Throat: Oropharynx is clear and moist. No oropharyngeal exudate.  Cardiovascular: Normal rate, regular rhythm and normal heart sounds. Exam reveals no gallop and no friction rub.  No murmur heard.  Pulmonary/Chest: Effort normal and breath sounds normal. No respiratory distress. He has no wheezes.  Abdominal: Soft. Bowel sounds are normal. He exhibits no distension. There is no tenderness.  Lymphadenopathy:  He has no cervical adenopathy.  Neurological: He is alert and oriented to person, place, and time.  Skin: Skin is warm and dry. No rash noted. No erythema.  Psychiatric: He has a normal mood and affect. His behavior is normal.    CBC Lab Results  Component Value Date   WBC 7.2 04/20/2023   RBC 3.23 (L) 04/20/2023    HGB 9.3 (L) 04/20/2023   HCT 30.3 (L) 04/20/2023   PLT 241 04/20/2023   MCV 93.8 04/20/2023   MCH 28.8 04/20/2023   MCHC 30.7 04/20/2023   RDW 16.0 (H) 04/20/2023   LYMPHSABS 1.1 03/29/2023   MONOABS 0.9 03/29/2023   EOSABS 0.1 03/29/2023    BMET Lab Results  Component Value Date   NA 134 (L) 04/20/2023   K 4.1 04/20/2023   CL 95 (L) 04/20/2023   CO2 25 04/20/2023   GLUCOSE 73 04/20/2023   BUN 37 (H) 04/20/2023   CREATININE 8.96 (H) 04/20/2023   CALCIUM 7.9 (L) 04/20/2023   GFRNONAA 6 (L) 04/20/2023   GFRAA 8 (L) 02/01/2020    Lab Results  Component Value Date   ESRSEDRATE 65 (H) 04/12/2023   Lab Results  Component Value Date   CRP 14.3 (H) 04/12/2023     Assessment and Plan  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

## 2023-05-25 NOTE — Telephone Encounter (Signed)
Got it thank you

## 2023-05-27 ENCOUNTER — Ambulatory Visit: Payer: Medicare Other | Admitting: Internal Medicine

## 2023-06-01 ENCOUNTER — Encounter (HOSPITAL_COMMUNITY): Payer: Self-pay

## 2023-06-01 ENCOUNTER — Other Ambulatory Visit: Payer: Self-pay

## 2023-06-01 ENCOUNTER — Inpatient Hospital Stay (HOSPITAL_COMMUNITY)
Admission: EM | Admit: 2023-06-01 | Discharge: 2023-06-05 | DRG: 811 | Disposition: A | Discharge status: Skilled Nursing Facility | Payer: Medicare Other | Source: Skilled Nursing Facility | Attending: Family Medicine | Admitting: Family Medicine

## 2023-06-01 DIAGNOSIS — M8618 Other acute osteomyelitis, other site: Secondary | ICD-10-CM | POA: Diagnosis present

## 2023-06-01 DIAGNOSIS — Z87891 Personal history of nicotine dependence: Secondary | ICD-10-CM

## 2023-06-01 DIAGNOSIS — E11311 Type 2 diabetes mellitus with unspecified diabetic retinopathy with macular edema: Secondary | ICD-10-CM | POA: Diagnosis present

## 2023-06-01 DIAGNOSIS — I864 Gastric varices: Secondary | ICD-10-CM | POA: Diagnosis present

## 2023-06-01 DIAGNOSIS — E11649 Type 2 diabetes mellitus with hypoglycemia without coma: Secondary | ICD-10-CM | POA: Diagnosis present

## 2023-06-01 DIAGNOSIS — K922 Gastrointestinal hemorrhage, unspecified: Secondary | ICD-10-CM | POA: Diagnosis not present

## 2023-06-01 DIAGNOSIS — N186 End stage renal disease: Secondary | ICD-10-CM | POA: Diagnosis present

## 2023-06-01 DIAGNOSIS — I480 Paroxysmal atrial fibrillation: Secondary | ICD-10-CM | POA: Diagnosis present

## 2023-06-01 DIAGNOSIS — M898X9 Other specified disorders of bone, unspecified site: Secondary | ICD-10-CM | POA: Diagnosis present

## 2023-06-01 DIAGNOSIS — Z993 Dependence on wheelchair: Secondary | ICD-10-CM

## 2023-06-01 DIAGNOSIS — I9589 Other hypotension: Secondary | ICD-10-CM | POA: Diagnosis not present

## 2023-06-01 DIAGNOSIS — R195 Other fecal abnormalities: Secondary | ICD-10-CM | POA: Diagnosis not present

## 2023-06-01 DIAGNOSIS — Z992 Dependence on renal dialysis: Secondary | ICD-10-CM

## 2023-06-01 DIAGNOSIS — Z66 Do not resuscitate: Secondary | ICD-10-CM | POA: Diagnosis present

## 2023-06-01 DIAGNOSIS — Z8546 Personal history of malignant neoplasm of prostate: Secondary | ICD-10-CM | POA: Diagnosis not present

## 2023-06-01 DIAGNOSIS — Z7901 Long term (current) use of anticoagulants: Secondary | ICD-10-CM | POA: Diagnosis not present

## 2023-06-01 DIAGNOSIS — D62 Acute posthemorrhagic anemia: Secondary | ICD-10-CM | POA: Diagnosis present

## 2023-06-01 DIAGNOSIS — T45525A Adverse effect of antithrombotic drugs, initial encounter: Secondary | ICD-10-CM | POA: Diagnosis present

## 2023-06-01 DIAGNOSIS — Z79899 Other long term (current) drug therapy: Secondary | ICD-10-CM

## 2023-06-01 DIAGNOSIS — D4819 Other specified neoplasm of uncertain behavior of connective and other soft tissue: Secondary | ICD-10-CM | POA: Diagnosis present

## 2023-06-01 DIAGNOSIS — D6832 Hemorrhagic disorder due to extrinsic circulating anticoagulants: Secondary | ICD-10-CM | POA: Diagnosis present

## 2023-06-01 DIAGNOSIS — Z7401 Bed confinement status: Secondary | ICD-10-CM

## 2023-06-01 DIAGNOSIS — I350 Nonrheumatic aortic (valve) stenosis: Secondary | ICD-10-CM | POA: Diagnosis present

## 2023-06-01 DIAGNOSIS — E785 Hyperlipidemia, unspecified: Secondary | ICD-10-CM | POA: Diagnosis present

## 2023-06-01 DIAGNOSIS — E162 Hypoglycemia, unspecified: Secondary | ICD-10-CM | POA: Diagnosis not present

## 2023-06-01 DIAGNOSIS — E1122 Type 2 diabetes mellitus with diabetic chronic kidney disease: Secondary | ICD-10-CM | POA: Diagnosis present

## 2023-06-01 DIAGNOSIS — K819 Cholecystitis, unspecified: Secondary | ICD-10-CM | POA: Diagnosis present

## 2023-06-01 DIAGNOSIS — Z6820 Body mass index (BMI) 20.0-20.9, adult: Secondary | ICD-10-CM

## 2023-06-01 DIAGNOSIS — D689 Coagulation defect, unspecified: Secondary | ICD-10-CM

## 2023-06-01 DIAGNOSIS — E1169 Type 2 diabetes mellitus with other specified complication: Secondary | ICD-10-CM | POA: Diagnosis present

## 2023-06-01 DIAGNOSIS — R54 Age-related physical debility: Secondary | ICD-10-CM | POA: Diagnosis present

## 2023-06-01 DIAGNOSIS — D631 Anemia in chronic kidney disease: Secondary | ICD-10-CM | POA: Diagnosis present

## 2023-06-01 DIAGNOSIS — K627 Radiation proctitis: Secondary | ICD-10-CM | POA: Diagnosis present

## 2023-06-01 DIAGNOSIS — Z841 Family history of disorders of kidney and ureter: Secondary | ICD-10-CM

## 2023-06-01 DIAGNOSIS — I120 Hypertensive chronic kidney disease with stage 5 chronic kidney disease or end stage renal disease: Secondary | ICD-10-CM | POA: Diagnosis present

## 2023-06-01 DIAGNOSIS — D649 Anemia, unspecified: Secondary | ICD-10-CM | POA: Diagnosis not present

## 2023-06-01 DIAGNOSIS — R001 Bradycardia, unspecified: Secondary | ICD-10-CM | POA: Diagnosis present

## 2023-06-01 DIAGNOSIS — K635 Polyp of colon: Secondary | ICD-10-CM | POA: Diagnosis present

## 2023-06-01 DIAGNOSIS — K219 Gastro-esophageal reflux disease without esophagitis: Secondary | ICD-10-CM | POA: Diagnosis present

## 2023-06-01 LAB — CBC WITH DIFFERENTIAL/PLATELET
Abs Immature Granulocytes: 0.02 10*3/uL (ref 0.00–0.07)
Basophils Absolute: 0 10*3/uL (ref 0.0–0.1)
Basophils Relative: 0 %
Eosinophils Absolute: 0.1 10*3/uL (ref 0.0–0.5)
Eosinophils Relative: 1 %
HCT: 29.2 % — ABNORMAL LOW (ref 39.0–52.0)
Hemoglobin: 8.4 g/dL — ABNORMAL LOW (ref 13.0–17.0)
Immature Granulocytes: 0 %
Lymphocytes Relative: 14 %
Lymphs Abs: 1 10*3/uL (ref 0.7–4.0)
MCH: 29.4 pg (ref 26.0–34.0)
MCHC: 28.8 g/dL — ABNORMAL LOW (ref 30.0–36.0)
MCV: 102.1 fL — ABNORMAL HIGH (ref 80.0–100.0)
Monocytes Absolute: 0.5 10*3/uL (ref 0.1–1.0)
Monocytes Relative: 8 %
Neutro Abs: 5.5 10*3/uL (ref 1.7–7.7)
Neutrophils Relative %: 77 %
Platelets: 293 10*3/uL (ref 150–400)
RBC: 2.86 MIL/uL — ABNORMAL LOW (ref 4.22–5.81)
RDW: 19.8 % — ABNORMAL HIGH (ref 11.5–15.5)
WBC: 7.1 10*3/uL (ref 4.0–10.5)
nRBC: 0 % (ref 0.0–0.2)

## 2023-06-01 LAB — COMPREHENSIVE METABOLIC PANEL
ALT: 11 U/L (ref 0–44)
AST: 16 U/L (ref 15–41)
Albumin: 2.4 g/dL — ABNORMAL LOW (ref 3.5–5.0)
Alkaline Phosphatase: 59 U/L (ref 38–126)
Anion gap: 14 (ref 5–15)
BUN: 30 mg/dL — ABNORMAL HIGH (ref 8–23)
CO2: 28 mmol/L (ref 22–32)
Calcium: 9.6 mg/dL (ref 8.9–10.3)
Chloride: 98 mmol/L (ref 98–111)
Creatinine, Ser: 5.95 mg/dL — ABNORMAL HIGH (ref 0.61–1.24)
GFR, Estimated: 9 mL/min — ABNORMAL LOW (ref >60)
Glucose, Bld: 72 mg/dL (ref 70–99)
Potassium: 3.3 mmol/L — ABNORMAL LOW (ref 3.5–5.1)
Sodium: 140 mmol/L (ref 135–145)
Total Bilirubin: 0.9 mg/dL (ref <1.2)
Total Protein: 8.3 g/dL — ABNORMAL HIGH (ref 6.5–8.1)

## 2023-06-01 LAB — CBC
HCT: 24.9 % — ABNORMAL LOW (ref 39.0–52.0)
Hemoglobin: 7.1 g/dL — ABNORMAL LOW (ref 13.0–17.0)
MCH: 28.9 pg (ref 26.0–34.0)
MCHC: 28.5 g/dL — ABNORMAL LOW (ref 30.0–36.0)
MCV: 101.2 fL — ABNORMAL HIGH (ref 80.0–100.0)
Platelets: 291 10*3/uL (ref 150–400)
RBC: 2.46 MIL/uL — ABNORMAL LOW (ref 4.22–5.81)
RDW: 20.1 % — ABNORMAL HIGH (ref 11.5–15.5)
WBC: 7.3 10*3/uL (ref 4.0–10.5)
nRBC: 0 % (ref 0.0–0.2)

## 2023-06-01 LAB — CBG MONITORING, ED
Glucose-Capillary: 65 mg/dL — ABNORMAL LOW (ref 70–99)
Glucose-Capillary: 69 mg/dL — ABNORMAL LOW (ref 70–99)
Glucose-Capillary: 118 mg/dL — ABNORMAL HIGH (ref 70–99)

## 2023-06-01 LAB — PROTIME-INR
INR: 1.4 — ABNORMAL HIGH (ref 0.8–1.2)
Prothrombin Time: 17.6 s — ABNORMAL HIGH (ref 11.4–15.2)

## 2023-06-01 LAB — CREATININE, SERUM
Creatinine, Ser: 6.21 mg/dL — ABNORMAL HIGH (ref 0.61–1.24)
GFR, Estimated: 9 mL/min — ABNORMAL LOW (ref >60)

## 2023-06-01 LAB — LIPASE, BLOOD: Lipase: 125 U/L — ABNORMAL HIGH (ref 11–51)

## 2023-06-01 LAB — LACTIC ACID, PLASMA
Lactic Acid, Venous: 1.2 mmol/L (ref 0.5–1.9)
Lactic Acid, Venous: 1.1 mmol/L (ref 0.5–1.9)

## 2023-06-01 LAB — GLUCOSE, CAPILLARY: Glucose-Capillary: 117 mg/dL — ABNORMAL HIGH (ref 70–99)

## 2023-06-01 LAB — PREPARE RBC (CROSSMATCH)

## 2023-06-01 MED ORDER — BISACODYL 5 MG PO TBEC: 5.0000 mg | DELAYED_RELEASE_TABLET | Freq: Every day | ORAL | Status: DC | PRN

## 2023-06-01 MED ORDER — MIDODRINE HCL 5 MG PO TABS
5.0000 mg | ORAL_TABLET | Freq: Three times a day (TID) | ORAL | Status: DC
  Administered 2023-06-01 – 2023-06-03 (×5): 5 mg via ORAL
  Filled 2023-06-01 (×5): qty 1

## 2023-06-01 MED ORDER — PREGABALIN 75 MG PO CAPS
75.0000 mg | ORAL_CAPSULE | Freq: Every day | ORAL | Status: DC
  Administered 2023-06-02 – 2023-06-03 (×2): 75 mg via ORAL
  Filled 2023-06-01 (×4): qty 1

## 2023-06-01 MED ORDER — POLYETHYLENE GLYCOL 3350 17 G PO PACK
17.0000 g | PACK | Freq: Every day | ORAL | Status: DC
  Filled 2023-06-01 (×4): qty 1

## 2023-06-01 MED ORDER — DEXTROSE 50 % IV SOLN
1.0000 | INTRAVENOUS | Status: DC | PRN
  Administered 2023-06-02 – 2023-06-05 (×4): 50 mL via INTRAVENOUS
  Filled 2023-06-01 (×6): qty 50

## 2023-06-01 MED ORDER — HEPARIN SODIUM (PORCINE) 5000 UNIT/ML IJ SOLN
5000.0000 [IU] | Freq: Three times a day (TID) | INTRAMUSCULAR | Status: DC
  Administered 2023-06-01 – 2023-06-04 (×8): 5000 [IU] via SUBCUTANEOUS
  Filled 2023-06-01 (×8): qty 1

## 2023-06-01 MED ORDER — DEXTROSE-SODIUM CHLORIDE 5-0.9 % IV SOLN: INTRAVENOUS | Status: DC

## 2023-06-01 MED ORDER — ACETAMINOPHEN 325 MG PO TABS: 650.0000 mg | ORAL_TABLET | Freq: Four times a day (QID) | ORAL | Status: DC | PRN

## 2023-06-01 MED ORDER — SODIUM CHLORIDE 0.9 % IV BOLUS
500.0000 mL | Freq: Once | INTRAVENOUS | Status: AC
  Administered 2023-06-01: 500 mL via INTRAVENOUS

## 2023-06-01 MED ORDER — CALCITRIOL 0.25 MCG PO CAPS
0.2500 ug | ORAL_CAPSULE | Freq: Every day | ORAL | Status: DC
  Administered 2023-06-01 – 2023-06-05 (×5): 0.25 ug via ORAL
  Filled 2023-06-01 (×5): qty 1

## 2023-06-01 MED ORDER — OXYCODONE-ACETAMINOPHEN 5-325 MG PO TABS
1.0000 | ORAL_TABLET | Freq: Four times a day (QID) | ORAL | Status: DC | PRN
  Administered 2023-06-03: 1 via ORAL
  Filled 2023-06-01: qty 1

## 2023-06-01 MED ORDER — SODIUM CHLORIDE 0.9 % IV SOLN
1.0000 g | INTRAVENOUS | Status: DC
  Filled 2023-06-01 (×2): qty 1

## 2023-06-01 MED ORDER — ACETAMINOPHEN 650 MG RE SUPP: 650.0000 mg | Freq: Four times a day (QID) | RECTAL | Status: DC | PRN

## 2023-06-01 NOTE — ED Triage Notes (Signed)
Pt sent from rehab for low HGB.  Pt found to have a glucose of 60 and given 2 tubes of oral contrast.  Pt has no complaints at this time.

## 2023-06-01 NOTE — ED Provider Notes (Signed)
[place] [facility] AT [facility] Provider Note   CSN: [id number] Arrival date & time: 06/01/23  1322     History  Chief Complaint  Patient presents with   Abnormal Lab    Justin [name] is a 77 y.o. male.  Patient to ED by EMS from rehab called for report of low hemoglobin of 7.1. No significant additional information provided by staff per EMS. Found to have CBG 60, given glucose by EMS and sugar dropped requiring additional dosing. The patient has no complaints. 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 Last admission September for ESRD and septic shock. Now on [place] given at HD. Followed by ID.   The history is provided by the patient. No language interpreter was used.  Abnormal Lab      Home Medications Prior to Admission medications   Medication Sig Start Date End Date Taking? Authorizing Provider  acetaminophen (TYLENOL) 325 MG tablet Take 2 tablets (650 mg total) by mouth every 6 (six) hours as needed for mild pain (or Fever >/= 101). 11/24/22   Pokhrel, Rebekah Chesterfield, MD  amiodarone (PACERONE) 200 MG tablet Take 1 tablet (200 mg total) by mouth daily. 04/09/23   Burnadette Pop, MD  apixaban (ELIQUIS) 2.5 MG TABS tablet Take 1 tablet (2.5 mg total) by mouth 2 (two) times daily. 04/08/23   Burnadette Pop, MD  atorvastatin (LIPITOR) 40 MG tablet Take 40 mg by mouth at bedtime. 10/30/14   [provider]  calcitRIOL (ROCALTROL) 0.25 MCG capsule Take 3 capsules (0.75 mcg total) by mouth every Monday, Wednesday, and Friday with hemodialysis. Patient taking differently: Take 0.25 mcg by mouth daily. 11/25/22   Pokhrel, Rebekah Chesterfield, MD  cefTAZidime 1 g in sodium chloride 0.9 % 100 mL Inject 1 g into the vein every Monday, Wednesday, and Friday with hemodialysis. 04/21/23  07/20/23  Willeen Niece, MD  Digoxin 62.5 MCG TABS Take 0.0625 mg by mouth every other day. 04/23/23 05/23/23  Willeen Niece, MD  pantoprazole (PROTONIX) 40 MG tablet Take 1 tablet (40 mg total) by mouth 2 (two) times daily before a meal. Patient taking differently: Take 40 mg by mouth daily. 11/24/22   Pokhrel, Rebekah Chesterfield, MD  PERCOCET 5-325 MG tablet Take 1 tablet by mouth every 6 (six) hours as needed. 05/03/23   [provider]  pregabalin (LYRICA) 75 MG capsule Take 1 capsule (75 mg total) by mouth at bedtime. Patient taking differently: Take 75 mg by mouth 2 (two) times daily. 11/24/22 11/24/23  Pokhrel, Rebekah Chesterfield, MD  sevelamer carbonate (RENVELA) 800 MG tablet Take 800 mg by mouth 2 (two) times daily.    [provider]      Allergies    Patient has no known allergies.    Review of Systems   Review of Systems  Physical Exam Updated Vital Signs BP (!) 94/41   Pulse (!) 54   Temp 97.6 F (36.4 C) (Rectal)   Resp 12   Wt 79.8 kg   SpO2 100%   BMI 25.24 kg/m  Physical Exam Vitals and nursing note reviewed.  Constitutional:      Comments: Cachectic, frail appearing.  HENT:     Head: Normocephalic.     Mouth/Throat:     Mouth: Mucous membranes are dry.  Eyes:     Comments: Pale conjunctiva  Cardiovascular:     Rate and Rhythm:  Regular rhythm. Bradycardia present.     Heart sounds: Murmur heard.  Pulmonary:     Effort: Pulmonary effort is normal.     Breath sounds: No wheezing or rales.  Abdominal:     General: There is no distension.     Palpations: Abdomen is soft.  Musculoskeletal:     Cervical back: Normal range of motion and neck supple.     Comments: Extremities cold without significant color change. Cap RF delayed.   Skin:    Findings: No erythema.  Neurological:     Mental Status: He is oriented to person, place, and time.     ED Results / Procedures / Treatments   Labs (all labs ordered are listed, but only abnormal results are  displayed) Labs Reviewed  CBC WITH DIFFERENTIAL/PLATELET - Abnormal; Notable for the following components:      Result Value   RBC 2.86 (*)    Hemoglobin 8.4 (*)    HCT 29.2 (*)    MCV 102.1 (*)    MCHC 28.8 (*)    RDW 19.8 (*)    All other components within normal limits  PROTIME-INR - Abnormal; Notable for the following components:   Prothrombin Time 17.6 (*)    INR 1.4 (*)    All other components within normal limits  COMPREHENSIVE METABOLIC PANEL - Abnormal; Notable for the following components:   Potassium 3.3 (*)    BUN 30 (*)    Creatinine, Ser 5.95 (*)    Total Protein 8.3 (*)    Albumin 2.4 (*)    GFR, Estimated 9 (*)    All other components within normal limits  LIPASE, BLOOD - Abnormal; Notable for the following components:   Lipase 125 (*)    All other components within normal limits  CBG MONITORING, ED - Abnormal; Notable for the following components:   Glucose-Capillary 65 (*)    All other components within normal limits  CBG MONITORING, ED - Abnormal; Notable for the following components:   Glucose-Capillary 69 (*)    All other components within normal limits  CULTURE, BLOOD (ROUTINE X 2)  CULTURE, BLOOD (ROUTINE X 2)  LACTIC ACID, PLASMA  LACTIC ACID, PLASMA  POC OCCULT BLOOD, ED  I-STAT CHEM 8, ED  TYPE AND SCREEN  PREPARE RBC (CROSSMATCH)   Results for orders placed or performed during the hospital encounter of 06/01/23  CBC with Differential  Result Value Ref Range   WBC 7.1 4.0 - 10.5 K/uL   RBC 2.86 (L) 4.22 - 5.81 MIL/uL   Hemoglobin 8.4 (L) 13.0 - 17.0 g/dL   HCT 82.9 (L) 56.2 - 13.0 %   MCV 102.1 (H) 80.0 - 100.0 fL   MCH 29.4 26.0 - 34.0 pg   MCHC 28.8 (L) 30.0 - 36.0 g/dL   RDW 86.5 (H) 78.4 - 69.6 %   Platelets 293 150 - 400 K/uL   nRBC 0.0 0.0 - 0.2 %   Neutrophils Relative % 77 %   Neutro Abs 5.5 1.7 - 7.7 K/uL   Lymphocytes Relative 14 %   Lymphs Abs 1.0 0.7 - 4.0 K/uL   Monocytes Relative 8 %   Monocytes Absolute 0.5 0.1 - 1.0  K/uL   Eosinophils Relative 1 %   Eosinophils Absolute 0.1 0.0 - 0.5 K/uL   Basophils Relative 0 %   Basophils Absolute 0.0 0.0 - 0.1 K/uL   Immature Granulocytes 0 %   Abs Immature Granulocytes 0.02 0.00 - 0.07 K/uL  Protime-INR  Result Value Ref Range   Prothrombin Time 17.6 (H) 11.4 - 15.2 seconds   INR 1.4 (H) 0.8 - 1.2  Comprehensive metabolic panel  Result Value Ref Range   Sodium 140 135 - 145 mmol/L   Potassium 3.3 (L) 3.5 - 5.1 mmol/L   Chloride 98 98 - 111 mmol/L   CO2 28 22 - 32 mmol/L   Glucose, Bld 72 70 - 99 mg/dL   BUN 30 (H) 8 - 23 mg/dL   Creatinine, Ser 3.24 (H) 0.61 - 1.24 mg/dL   Calcium 9.6 8.9 - 40.1 mg/dL   Total Protein 8.3 (H) 6.5 - 8.1 g/dL   Albumin 2.4 (L) 3.5 - 5.0 g/dL   AST 16 15 - 41 U/L   ALT 11 0 - 44 U/L   Alkaline Phosphatase 59 38 - 126 U/L   Total Bilirubin 0.9 <1.2 mg/dL   GFR, Estimated 9 (L) >60 mL/min   Anion gap 14 5 - 15  Lipase, blood  Result Value Ref Range   Lipase 125 (H) 11 - 51 U/L  Lactic acid, plasma  Result Value Ref Range   Lactic Acid, Venous 1.2 0.5 - 1.9 mmol/L  CBG monitoring, ED  Result Value Ref Range   Glucose-Capillary 65 (L) 70 - 99 mg/dL  POC CBG, ED  Result Value Ref Range   Glucose-Capillary 69 (L) 70 - 99 mg/dL  Type and screen [facility]  Result Value Ref Range   ABO/RH(D) O POS    Antibody Screen NEG    Sample Expiration 06/04/2023,2359    Unit Number [id number]    Blood Component Type RED CELLS,LR    Unit division 00    Status of Unit ALLOCATED    Transfusion Status OK TO TRANSFUSE    Crossmatch Result      COMPATIBLE Performed at [facility], [address] [place], [place] [address]    Unit Number [id number]    Blood Component Type RED CELLS,LR    Unit division 00    Status of Unit ALLOCATED    Transfusion Status OK TO TRANSFUSE    Crossmatch Result COMPATIBLE   Prepare RBC  Result Value Ref Range   Order Confirmation      ORDER PROCESSED BY BLOOD BANK Performed at  [facility], [address], [place], [place] [address]   BPAM [facility]  Result Value Ref Range   Blood Product Unit Number [id number]    PRODUCT CODE A3557D22    Unit Type and Rh 5100    Blood Product Expiration Date 025427062376    Blood Product Unit Number [id number]    PRODUCT CODE E0382V00    Unit Type and Rh 5100    Blood Product Expiration Date 710626948546     EKG EKG Interpretation Date/Time:  Tuesday June 01 2023 15:42:53 EST Ventricular Rate:  53 PR Interval:    QRS Duration:  96 QT Interval:  620 QTC Calculation: 583 R Axis:   73  Text Interpretation: Junctional rhythm Anteroseptal infarct, age indeterminate Lateral leads are also involved Prolonged QT interval Confirmed by Beckey Downing [phone]) on 06/01/2023 3:49:19 PM  Radiology No results found.  Procedures Procedures    Medications Ordered in ED Medications  dextrose 5 %-0.9 % sodium chloride infusion ( Intravenous New Bag/Given 06/01/23 1449)  sodium chloride 0.9 % bolus 500 mL (0 mLs Intravenous Stopped 06/01/23 1422)    ED Course/ Medical Decision Making/ A&P  Medical Decision Making This patient presents to the ED for concern of low hemoglobin, this involves an extensive number of treatment options, and is a complaint that carries with it a high risk of complications and morbidity.  The differential diagnosis includes active bleeding, chronic anemia, sepsis   Co morbidities that complicate the patient evaluation  ESRD-HD, biliary drain s/p cholecytectomy x 6 months, T2DM,HTN,    Additional history obtained:  Additional history obtained from EMS External records from outside source obtained and reviewed including  ID 11/11: Itzael is a 77yo M with HTN ,ESRD on HD receiving ceftaz (klebsiella bacteremia and ischial osteo) with hd for the past 6 wk completing on 11/14. Also has cholecystostomy drain for the past  6 months. Discharge summary 10/9:  history of    Patient was noted to have 3.5 cm ischial abscess which was  drained by IR, cultures were negative, He has completed antibiotic course in the hospital and was discharged on September 26.  Patient now presented back to the hospital again with persistent left groin pain.  IR reviewed images, there is no obvious area requiring drainage.  Currently on empiric antibiotics. ID on board.  Patient underwent line holiday on 04/16/2023 due to bacteremia. Status post repeat [facility] catheter placement on 04/19/2023 after line holiday.  Infectious disease recommended IV antibiotics during hemodialysis for 6 weeks.  Patient will get Fortaz antibiotics during hemodialysis days.  Patient is being discharged to [facility] rehab,    Lab Tests:  I Ordered, and personally interpreted labs.  The pertinent results include:  Hgb 8.4 (baseline appears to be 9.3). Potassium 3.3. No elevated WBC. Lipase 125 without recent comparable, and with existing biliary drain for cholelithiasis (not a surgical candidate). Guaiac positive stool   Imaging Studies ordered:  I ordered imaging studies including None felt indicated    Cardiac Monitoring: / EKG:  The patient was maintained on a cardiac monitor.  I personally viewed and interpreted the cardiac monitored which showed an underlying rhythm of: Bradycardia, junctional rhythm   Consultations Obtained:  I requested consultation with the GI, Castaneda,  and discussed lab and imaging findings as well as pertinent plan - they recommend: PPI BID, NPO    Problem List / ED Course / Critical interventions / Medication management  14:00:  From rehab reporting hgb 7 (baseline 9.3), decreased from reported recent. Grossly guaiac positive in the ED. Hypotensive (was 110 sys, now 98), on Eliquis - transfusion ordered.   14:35 - CBG still low at 65. D5/NS infusion at 108mL/hr started.   15:00 - Hgb resulted as 8.4. Transfusion order stopped prior to administration, to be resumed if he  destabilizes inpatient.   15:45 - hospitalist paged for admission. Patient appears stable at present. GI bleed in the setting of coagulopathy, on eliquis. BP stable hypotension at baseline, no tachycardia. Benign abdominal exam.   Social Determinants of Health:  Immobile, wheelchair bound Nursing home resident Poor insight into care.    Test / Admission - Considered:  To be admitted to hospitalist for recurrent GI bleed while on eliquis. GI will be consulted for their input.      Amount and/or Complexity of Data Reviewed Labs: ordered.  Risk Prescription drug management.           Final Clinical Impression(s) / ED Diagnoses Final diagnoses:  Gastrointestinal hemorrhage, unspecified gastrointestinal hemorrhage type  Coagulopathy (HCC)  Hypoglycemia  Chronic hypotension    Rx / DC Orders ED Discharge Orders  None         Elpidio Anis, PA-C 06/01/23 1623    Durwin Glaze, MD 06/01/23 [phone]

## 2023-06-01 NOTE — H&P (Signed)
History and Physical  Justin [name]:[id number] DOB: Aug 06, 1945 DOA: 06/01/2023 PCP: Charlynne Pander, MD  Chief Complaint: low hemoglobin Historian: patient, report from [place] rehab facility  HPI:  Justin [name] is a 77 y.o. male with a PMH significant for ESRD on HD MWF with left subclavian temp cath, diabetes mellitus, bedbound, A-fib on Eliquis, history of GI bleed, HTN, prolonged QT interval, aortic valve stenosis, HLD, gout, history of prostate cancer, biliary drain from cholecystitis, GERD, hypotension, history of bleeding gastric varices.  Currently receiving Fortaz with HD for recent admission with ischial osteomyelitis as instructed by ID. At baseline, they live at a [facility] and are mostly dependent for ADLs.  They presented from nursing home to the ED on 06/01/2023 with reported drop in hemoglobin to 7.1.  He denies any known bleeding.  He states that his bowel movements have been regular and he does not know what color they have been.  He attends his normally scheduled dialysis with last session yesterday which proceeded normally.  He is unaware of the facility's reason for sending him to the hospital.  He states that he is here because he has low appetite with intermittent nausea and vomiting for the past 2 months.  He denies current nausea.  Endorses being able to eat breakfast today without any vomiting.  He feels very hungry currently. Upon EMS arrival to the nursing facility, he was found to be hypoglycemic with CBG of 60 and was given EMS and route.  In the ED, it was found that they had stable on room air, hypertension of 90s systolic with 50s diastolic which appears to be at his baseline.  Mild bradycardia with heart rate in 50s.  Significant findings included: WBC 7.1, hemoglobin 8.4, platelets 293, RDW 19.8.  Na+ 140, K+ 3.3, creatinine 5.95, glucose 72.  LA 1.2.  Blood cultures collected.  Per report by EDP, Hemoccult fecal testing was positive.  They  were initially treated with approximately 1.5 L IV fluids with sugar.  GI was consulted by EDP.  Patient was admitted to medicine service for further workup and management of suspected GI bleed as outlined in detail below.  Assessment/Plan Principal Problem:   GI bleed   Acute worsening of anemia of chronic disease-although reported to have a hemoglobin of 7.1 from the facility, hemoglobin is 8.4 here which is slightly lower than his baseline for anemia of chronic disease.  No massive bleeding present although report of Hemoccult fecal testing was positive. -May expect continued hemoglobin drop on recheck as he received 1.2 L of IV fluids in the ED which will cause hemodilution -CBC repeat a.m.  Hypotension-chronic and appears to be at baseline.  Has had improvement after the IV fluids and initiation of midodrine -Continue midodrine 3 times daily  ESRD on HD MWF-last dialysis session was Monday.  Labs are relatively stable and not requiring urgent dialysis tonight. -Nephrology consulted for routine dialysis -Repeat metabolic panel a.m.  Hypoglycemia-patient states he has not had a good appetite for several months.  He does feel very hungry now and would like to eat. -Continue CBG checks -D50 ampules as needed -Encourage good p.o. intake -Antiemetics as needed  PAF-currently mildly bradycardic in the 50s.  He is asymptomatic. -Hold home Eliquis as there is concern for possible GI bleed.  Also has history of bleeding gastric varices so may be candidate to stop anticoagulation in the future.  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  Per ID recs he continues to  receive Elita Quick with his dialysis sessions for 8 weeks.  Discharged 04/21/2023.Marland Kitchen  Not currently showing any active signs of infection. 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) 12/07/2019   Dialysis patient [facility])    ESRD (end stage renal disease) on dialysis (HCC)    Gout    Hypertension    Hypotension 06/13/2022   Right posterior capsular opacification 02/27/2021   Septic shock (HCC)    Vitreous hemorrhage of right eye (HCC) 10/24/2019    Past Surgical History:  Procedure Laterality Date   A/V FISTULAGRAM Left 08/17/2017   Procedure: A/V FISTULAGRAM;  Surgeon: Renford Dills, MD;  Location: [facility] INVASIVE CV LAB;  Service: Cardiovascular;  Laterality: Left;   A/V SHUNT INTERVENTION N/A 08/17/2017   Procedure: A/V SHUNT INTERVENTION;  Surgeon: Renford Dills, MD;  Location: [facility] INVASIVE CV LAB;  Service: Cardiovascular;  Laterality: N/A;   AV FISTULA PLACEMENT Left 05/31/2015   Procedure: ARTERIOVENOUS (AV) FISTULA CREATION;  Surgeon: Renford Dills, MD;  Location: [facility] ORS;  Service: Vascular;  Laterality: Left;   COLONOSCOPY N/A 12/17/2022   Procedure: COLONOSCOPY;  Surgeon: Napoleon Form, MD;  Location: [facility] ENDOSCOPY;  Service: Gastroenterology;  Laterality: N/A;   COLONOSCOPY WITH PROPOFOL N/A 09/11/2022   Procedure: COLONOSCOPY WITH PROPOFOL;  Surgeon: Imogene Burn, MD;  Location: [facility] ENDOSCOPY;  Service: Gastroenterology;  Laterality: N/A;   COLONOSCOPY WITH PROPOFOL N/A 09/17/2022   Procedure: COLONOSCOPY WITH PROPOFOL;  Surgeon: Benancio Deeds, MD;  Location: [facility] ENDOSCOPY;  Service: Gastroenterology;  Laterality: N/A;   ESOPHAGOGASTRODUODENOSCOPY (EGD) WITH PROPOFOL N/A 09/11/2022   Procedure: ESOPHAGOGASTRODUODENOSCOPY  (EGD) WITH PROPOFOL;  Surgeon: Imogene Burn, MD;  Location: [facility] ENDOSCOPY;  Service: Gastroenterology;  Laterality: N/A;   ESOPHAGOGASTRODUODENOSCOPY (EGD) WITH PROPOFOL N/A 12/17/2022   Procedure: ESOPHAGOGASTRODUODENOSCOPY (EGD) WITH PROPOFOL;  Surgeon: Napoleon Form, MD;  Location: [facility] ENDOSCOPY;  Service: Gastroenterology;  Laterality: N/A;   excision bone spurs Bilateral 1989   feet   GIVENS CAPSULE STUDY N/A 11/10/2022   Procedure: GIVENS CAPSULE STUDY;  Surgeon: Shellia Cleverly, DO;  Location: [facility] ENDOSCOPY;  Service: Gastroenterology;  Laterality: N/A;   HEMOSTASIS CLIP PLACEMENT  09/11/2022   Procedure: HEMOSTASIS CLIP PLACEMENT;  Surgeon: Imogene Burn, MD;  Location: [facility] ENDOSCOPY;  Service: Gastroenterology;;   HEMOSTASIS CLIP PLACEMENT  09/17/2022   Procedure: HEMOSTASIS CLIP PLACEMENT;  Surgeon: Benancio Deeds, MD;  Location: [facility] ENDOSCOPY;  Service: Gastroenterology;;   HEMOSTASIS CLIP PLACEMENT  12/17/2022   Procedure: HEMOSTASIS CLIP PLACEMENT;  Surgeon: Napoleon Form, MD;  Location: [facility] ENDOSCOPY;  Service: Gastroenterology;;   HOT HEMOSTASIS N/A 09/11/2022   Procedure: HOT HEMOSTASIS (ARGON PLASMA COAGULATION/BICAP);  Surgeon: Imogene Burn, MD;  Location: [facility] ENDOSCOPY;  Service: Gastroenterology;  Laterality: N/A;   HOT HEMOSTASIS N/A 09/17/2022   Procedure: HOT HEMOSTASIS (ARGON PLASMA COAGULATION/BICAP);  Surgeon: Benancio Deeds, MD;  Location: [facility] ENDOSCOPY;  Service: Gastroenterology;  Laterality: N/A;   INCISION AND  DRAINAGE PERIRECTAL ABSCESS N/A 06/15/2022   Procedure: IRRIGATION AND DEBRIDEMENT PERIRECTAL ABSCESS;  Surgeon: Franky Macho, MD;  Location: [facility] ORS;  Service: General;  Laterality: N/A;   IR EXCHANGE BILIARY DRAIN  03/16/2023   IR EXCHANGE BILIARY DRAIN  05/06/2023   IR FLUORO GUIDE CV LINE LEFT  04/19/2023   IR REMOVAL TUN CV CATH W/O FL  04/16/2023   IR US GUIDE VASC ACCESS LEFT  04/20/2023   IR US GUIDE VASC ACCESS RIGHT  04/19/2023   IR  VENO/JUGULAR RIGHT  04/19/2023   KNEE SURGERY Left 1998   arthroscopy   POLYPECTOMY  09/11/2022   Procedure: POLYPECTOMY;  Surgeon: Imogene Burn, MD;  Location: [facility] ENDOSCOPY;  Service: Gastroenterology;;   SHOULDER SURGERY Left 1994   rotator cuff     reports that he quit smoking about 44 years ago. His smoking use included cigarettes. He has never used smokeless tobacco. He reports that he does not drink alcohol and does not use drugs.  No Known Allergies  Family History  Problem Relation Age of Onset   Kidney disease Mother    Alcoholism Father    Alcoholism Brother    Colon cancer Neg Hx    Stomach cancer Neg Hx    Esophageal cancer Neg Hx     Prior to Admission medications   Medication Sig Start Date End Date Taking? Authorizing Provider  acetaminophen (TYLENOL) 325 MG tablet Take 2 tablets (650 mg total) by mouth every 6 (six) hours as needed for mild pain (or Fever >/= 101). 11/24/22   Pokhrel, Rebekah Chesterfield, MD  amiodarone (PACERONE) 200 MG tablet Take 1 tablet (200 mg total) by mouth daily. 04/09/23   Burnadette Pop, MD  apixaban (ELIQUIS) 2.5 MG TABS tablet Take 1 tablet (2.5 mg total) by mouth 2 (two) times daily. 04/08/23   Burnadette Pop, MD  atorvastatin (LIPITOR) 40 MG tablet Take 40 mg by mouth at bedtime. 10/30/14   [provider]  calcitRIOL (ROCALTROL) 0.25 MCG capsule Take 3 capsules (0.75 mcg total) by mouth every Monday, Wednesday, and Friday with hemodialysis. Patient taking differently: Take 0.25 mcg by mouth daily. 11/25/22   Pokhrel, Rebekah Chesterfield, MD  cefTAZidime 1 g in sodium chloride 0.9 % 100 mL Inject 1 g into the vein every Monday, Wednesday, and Friday with hemodialysis. 04/21/23 07/20/23  Willeen Niece, MD  Digoxin 62.5 MCG TABS Take 0.0625 mg by mouth every other day. 04/23/23 05/23/23  Willeen Niece, MD  pantoprazole (PROTONIX) 40 MG tablet Take 1 tablet (40 mg total) by mouth 2 (two) times daily before a meal. Patient taking differently: Take 40 mg by  mouth daily. 11/24/22   Pokhrel, Rebekah Chesterfield, MD  PERCOCET 5-325 MG tablet Take 1 tablet by mouth every 6 (six) hours as needed. 05/03/23   [provider]  pregabalin (LYRICA) 75 MG capsule Take 1 capsule (75 mg total) by mouth at bedtime. Patient taking differently: Take 75 mg by mouth 2 (two) times daily. 11/24/22 11/24/23  Pokhrel, Rebekah Chesterfield, MD  sevelamer carbonate (RENVELA) 800 MG tablet Take 800 mg by mouth 2 (two) times daily.    [provider]   I have personally, briefly reviewed patient's prior medical records in [facility]  Objective: Blood pressure (!) 94/41, pulse (!) 54, temperature 97.6 F (36.4 C), temperature source Rectal, resp. rate 12, weight 79.8 kg, SpO2 100%.   Physical Exam Vitals and nursing note reviewed.  Constitutional:      General: He is not in acute distress.  Appearance: Normal appearance. He is not ill-appearing.  HENT:     Head: Normocephalic.     Nose: Nose normal.     Mouth/Throat:     Mouth: Mucous membranes are dry.  Eyes:     Conjunctiva/sclera: Conjunctivae normal.  Cardiovascular:     Rate and Rhythm: Normal rate and regular rhythm.     Pulses: Normal pulses.     Heart sounds: No murmur heard. Pulmonary:     Effort: Pulmonary effort is normal.     Breath sounds: Normal breath sounds.  Abdominal:     General: Abdomen is flat. There is no distension.     Palpations: Abdomen is soft.     Tenderness: There is no abdominal tenderness.     Comments: Percutaneous biliary drain on R flank with mild drainage.   Skin:    General: Skin is warm and dry.     Capillary Refill: Capillary refill takes less than 2 seconds.  Neurological:     General: No focal deficit present.     Mental Status: He is alert.  Psychiatric:        Mood and Affect: Mood normal.    Labs on Admission: I have personally reviewed admission labs and imaging studies  CBC    Component Value Date/Time   WBC 7.3 06/01/2023 1747   RBC 2.46 (L) 06/01/2023  1747   HGB 7.1 (L) 06/01/2023 1747   HCT 24.9 (L) 06/01/2023 1747   PLT 291 06/01/2023 1747   MCV 101.2 (H) 06/01/2023 1747   MCH 28.9 06/01/2023 1747   MCHC 28.5 (L) 06/01/2023 1747   RDW 20.1 (H) 06/01/2023 1747   LYMPHSABS 1.0 06/01/2023 1418   MONOABS 0.5 06/01/2023 1418   EOSABS 0.1 06/01/2023 1418   BASOSABS 0.0 06/01/2023 1418   CMP     Component Value Date/Time   NA 140 06/01/2023 1418   K 3.3 (L) 06/01/2023 1418   CL 98 06/01/2023 1418   CO2 28 06/01/2023 1418   GLUCOSE 72 06/01/2023 1418   BUN 30 (H) 06/01/2023 1418   CREATININE 6.21 (H) 06/01/2023 1747   CALCIUM 9.6 06/01/2023 1418   PROT 8.3 (H) 06/01/2023 1418   ALBUMIN 2.4 (L) 06/01/2023 1418   AST 16 06/01/2023 1418   ALT 11 06/01/2023 1418   ALKPHOS 59 06/01/2023 1418   BILITOT 0.9 06/01/2023 1418   GFRNONAA 9 (L) 06/01/2023 1747   GFRAA 8 (L) 02/01/2020 0424   Radiological Exams on Admission: No results found.  EKG: Independently reviewed. Sinus bradycardia  DVT prophylaxis: heparin injection 5,000 Units Start: 06/01/23 2200   Code Status: DNR. Discussed and confirmed with patient  Family Communication: none at bedside  Disposition Plan: admit to med-surg  Consults called: nephrology, GI   Leeroy Bock, DO [facility]  06/01/2023, 6:53 PM    To contact the appropriate [facility] Attending or Consulting provider: Check [email or web address] for coverage from 7pm-7am

## 2023-06-02 DIAGNOSIS — R195 Other fecal abnormalities: Secondary | ICD-10-CM

## 2023-06-02 DIAGNOSIS — K922 Gastrointestinal hemorrhage, unspecified: Secondary | ICD-10-CM | POA: Diagnosis not present

## 2023-06-02 DIAGNOSIS — D649 Anemia, unspecified: Secondary | ICD-10-CM | POA: Diagnosis not present

## 2023-06-02 LAB — GLUCOSE, CAPILLARY
Glucose-Capillary: 72 mg/dL (ref 70–99)
Glucose-Capillary: 55 mg/dL — ABNORMAL LOW (ref 70–99)
Glucose-Capillary: 114 mg/dL — ABNORMAL HIGH (ref 70–99)
Glucose-Capillary: 98 mg/dL (ref 70–99)
Glucose-Capillary: 61 mg/dL — ABNORMAL LOW (ref 70–99)
Glucose-Capillary: 122 mg/dL — ABNORMAL HIGH (ref 70–99)
Glucose-Capillary: 60 mg/dL — ABNORMAL LOW (ref 70–99)
Glucose-Capillary: 90 mg/dL (ref 70–99)

## 2023-06-02 LAB — BASIC METABOLIC PANEL
Anion gap: 15 (ref 5–15)
BUN: 33 mg/dL — ABNORMAL HIGH (ref 8–23)
CO2: 26 mmol/L (ref 22–32)
Calcium: 9.3 mg/dL (ref 8.9–10.3)
Chloride: 99 mmol/L (ref 98–111)
Creatinine, Ser: 6.64 mg/dL — ABNORMAL HIGH (ref 0.61–1.24)
GFR, Estimated: 8 mL/min — ABNORMAL LOW (ref >60)
Glucose, Bld: 141 mg/dL — ABNORMAL HIGH (ref 70–99)
Potassium: 3 mmol/L — ABNORMAL LOW (ref 3.5–5.1)
Sodium: 140 mmol/L (ref 135–145)

## 2023-06-02 LAB — CBC
HCT: 24 % — ABNORMAL LOW (ref 39.0–52.0)
Hemoglobin: 7.3 g/dL — ABNORMAL LOW (ref 13.0–17.0)
MCH: 30.4 pg (ref 26.0–34.0)
MCHC: 30.4 g/dL (ref 30.0–36.0)
MCV: 100 fL (ref 80.0–100.0)
Platelets: 249 10*3/uL (ref 150–400)
RBC: 2.4 MIL/uL — ABNORMAL LOW (ref 4.22–5.81)
RDW: 20.1 % — ABNORMAL HIGH (ref 11.5–15.5)
WBC: 5.7 10*3/uL (ref 4.0–10.5)
nRBC: 0 % (ref 0.0–0.2)

## 2023-06-02 LAB — HEMOGLOBIN AND HEMATOCRIT, BLOOD
HCT: 25.4 % — ABNORMAL LOW (ref 39.0–52.0)
Hemoglobin: 7.3 g/dL — ABNORMAL LOW (ref 13.0–17.0)

## 2023-06-02 MED ORDER — CHLORHEXIDINE GLUCONATE CLOTH 2 % EX PADS
6.0000 | MEDICATED_PAD | Freq: Every day | CUTANEOUS | Status: DC
  Administered 2023-06-02 – 2023-06-05 (×4): 6 via TOPICAL

## 2023-06-02 NOTE — Plan of Care (Signed)
  Problem: Education: Goal: Knowledge of General Education information will improve Description: Including pain rating scale, medication(s)/side effects and non-pharmacologic comfort measures Outcome: Progressing   Problem: Safety: Goal: Ability to remain free from injury will improve Outcome: Progressing   

## 2023-06-02 NOTE — Progress Notes (Addendum)
Pt refused skin assessment this AM. This nurse unable to assess bilateral wounds to feet. Pt educated on importance of assessment but still refused. MD is aware.

## 2023-06-02 NOTE — TOC Initial Note (Signed)
Transition of Care [facility]) - Initial/Assessment Note    Patient Details  Name: Justin [name] MRN: [id number] Date of Birth: 02-Nov-1945  Transition of Care [facility]) CM/SW Contact:    Leitha Bleak, RN Phone Number: 06/02/2023, 2:17 PM  Clinical Narrative:   Patient admitted with GI bleed from [facility], Patient is willing to return, per Revonda Standard he will need PT eval and Auth. MD advised to order PT. TOC following to to start INS AUTH.                 Expected Discharge Plan: Skilled Nursing Facility Barriers to Discharge: Continued Medical Work up   Patient Goals and CMS Choice Patient states their goals for this hospitalization and ongoing recovery are:: return to Rehab CMS Medicare.gov Compare Post Acute Care list provided to:: Patient Choice offered to / list presented to : Patient     Expected Discharge Plan and Services       Prior Living Arrangements/Services     Patient language and need for interpreter reviewed:: Yes        Need for Family Participation in Patient Care: Yes (Comment) Care giver support system in place?: Yes (comment) Current home services: DME Criminal Activity/Legal Involvement Pertinent to Current Situation/Hospitalization: No - Comment as needed  Activities of Daily Living   ADL Screening (condition at time of admission) Independently performs ADLs?: No Does the patient have a NEW difficulty with bathing/dressing/toileting/self-feeding that is expected to last >3 days?: No Does the patient have a NEW difficulty with getting in/out of bed, walking, or climbing stairs that is expected to last >3 days?: No Does the patient have a NEW difficulty with communication that is expected to last >3 days?: No Is the patient deaf or have difficulty hearing?: No Does the patient have difficulty seeing, even when wearing glasses/contacts?: No Does the patient have difficulty concentrating, remembering, or making decisions?: No  Permission  Sought/Granted         Emotional Assessment     Affect (typically observed): Accepting Orientation: : Oriented to Self, Oriented to Place, Oriented to Situation Alcohol / Substance Use: Not Applicable Psych Involvement: No (comment)  Admission diagnosis:  Chronic hypotension [I95.89] Coagulopathy (HCC) [D68.9] Hypoglycemia [E16.2] GI bleed [K92.2] Gastrointestinal hemorrhage, unspecified gastrointestinal hemorrhage type [K92.2] Patient Active Problem List   Diagnosis Date Noted   Hypoglycemia 06/01/2023   ESRD (end stage renal disease) (HCC) 06/01/2023   Coagulopathy (HCC) 06/01/2023   Chronic hypotension 06/01/2023   History of GI bleed 04/13/2023   Acute hip pain, left 04/13/2023   SIRS (systemic inflammatory response syndrome) (HCC) 04/13/2023   Ambulatory dysfunction 04/13/2023   History of cholecystitis 04/13/2023   Dysphagia 04/13/2023   Shock (HCC) 12/22/2022   Rectal ulcer 12/17/2022   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   Hemorrhagic shock (HCC) 12/14/2022   Acute encephalopathy 11/09/2022   Syncope 11/09/2022   Acute blood loss anemia 11/09/2022   Melena 11/09/2022   History of colonic polyps 11/09/2022   End stage renal disease (HCC) 11/09/2022   Gastric nodule 11/09/2022   Rectal bleeding 09/17/2022   Colon ulcer 09/17/2022   Lower GI bleeding 09/17/2022   GI bleed 09/16/2022   Prolonged QT interval 09/16/2022   History of prostate cancer 09/16/2022   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   History of colon polyps 09/16/2022   Lower GI bleed 09/12/2022   Acute GI bleeding 09/10/2022   Wide-complex tachycardia 06/17/2022   Hypotension 06/13/2022   Obesity (BMI 30-39.9) 06/13/2022   Perirectal abscess  06/13/2022   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   Murmur, cardiac 01/27/2022   Chronic back pain 01/27/2022   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/27/2022   GERD (gastroesophageal reflux disease) 11/11/2021   High risk medication use  11/11/2021   History of cardioversion 11/11/2021   Paroxysmal atrial fibrillation with RVR (HCC) 11/11/2021   PVC (premature ventricular contraction) 11/11/2021   Nonrheumatic aortic valve stenosis 11/06/2021   Mixed hyperlipidemia 11/06/2021   Abdominal mass 05/05/2021   First degree AV block 07/13/2020   Paroxysmal A-fib (HCC) 01/29/2020   Wound infection 01/28/2020   H/O vitrectomy 12/07/2019   Macular pucker, 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 determined by examination associated with type 2 diabetes mellitus (HCC) 10/24/2019   Stable treated proliferative diabetic retinopathy of left eye determined by examination associated with type 2 diabetes mellitus (HCC) 10/24/2019   ESRD on hemodialysis (HCC) 07/29/2017   Anticoagulated 07/13/2017   Malignant neoplasm of prostate (HCC) 07/12/2017   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 [place] [address] Phone: [phone] Fax: [phone]  [facility] - [place], [place] - [address] [place] [address] Phone: [phone] Fax: [phone]  [facility] - [address] [place], [place] - [address] [place] [address] Phone: [phone] Fax: [phone]  Social Determinants of Health (SDOH) Social History: SDOH Screenings   Food Insecurity: No Food Insecurity (06/01/2023)   Recent Concern: Food Insecurity - Food Insecurity Present (04/13/2023)  Housing: Low Risk  (06/01/2023)  Transportation Needs: Unmet Transportation Needs (06/01/2023)  Utilities: Not At Risk (06/01/2023)  Social Connections: Unknown (10/13/2022)   Received from [facility]  Stress: No Stress Concern Present (10/14/2022)   Received from [facility]  Tobacco Use: Medium Risk (06/01/2023)   SDOH Interventions:     Readmission Risk Interventions    06/02/2023    2:16 PM 03/30/2023    2:29 PM 11/14/2022   10:08 AM  Readmission Risk Prevention Plan  Transportation Screening Complete Complete Complete  Medication Review (RN Care Manager) Complete Referral to Pharmacy Complete  PCP or Specialist appointment within 3-5 days of discharge Not Complete Complete Complete  HRI or Home Care Consult Complete Complete Complete  SW Recovery Care/Counseling Consult Complete Complete Complete  Palliative Care Screening Not Applicable Not Applicable Not Applicable  Skilled Nursing Facility Not Applicable Not Applicable Complete

## 2023-06-02 NOTE — Progress Notes (Signed)
Pt refused skin assessment to be completed. He has wrapped wounds on both of his feet he would not let us unwrap. He said that they dressed daily and they had just been wrapped prior to him coming to the hospital. Pt was educated on reason why we have to look at his skin. He stated once more that we weren't "undoing" his dressings. Pt also refused a rectal temp. MD was made aware of situation.

## 2023-06-02 NOTE — Progress Notes (Signed)
[facility] PROGRESS NOTE  ALEN SUITE (DOB: 1946-03-26) PIR:[id number] PCP: Charlynne Pander, MD  Brief Narrative: Justin [name] is a 77 y.o. male with a history of ESRD on HD TTS thru [facility], ischial osteomyelitis on fortaz w/HD, T2DM, HTN, HLD, AFib on eliquis, hx GI bleed/bleeding gastric varices, AS, hx prostate CA, cholecystitis with percutaneous biliary drain who presented to the ED from SNF on 06/01/2023 due to low hemoglobin of 7.1, also found to have +FOBT stool. He was admitted with GI consultation pending.   Subjective: Has no abd pain, no N/V currently. Has no acute complaints. Denies chest pain, dyspnea, palpitations, or noting gross bleeding  Objective: BP (!) 82/40 (BP Location: Right Arm)   Pulse (!) 58   Temp 97.6 F (36.4 C) (Axillary)   Resp 16   Ht 5\' 10"  (1.778 m)   Wt 67.1 kg   SpO2 95%   BMI 21.23 kg/m   Gen: Chronically ill-appearing male in no distress Pulm: Clear, nonlabored  CV: RRR, II/VI systolic murmur throughout precordium, possible early diastolic component as well, soft S2 GI: Soft, NT, ND, +BS. RUQ biliary drain in place, dressing is c/d/i Neuro: Alert and oriented. No new focal deficits. Ext: Warm, no deformities, pt refuses to let me examine under dressings.   Assessment & Plan: Acute blood loss anemia on anemia of ESRD:  - No gross/active bleeding currently, can continue inpatient monitoring on floor.  - Monitor H/H this PM, Check AM CBC. Pt has had transfusions in the past, consents if meeting threshold now. T&S is up to date. - Holding anticoagulation (maybe should stop this given hx bleeding gastric varices and indication is AFib).  - GI consult pending. Pt is NPO.  - Nephrology consulted, defer ESA to them.  ESRD:  - Continue TTS schedule thru [facility], nephrology consultation appreciated.   Ischial osteomyelitis:  - Continue fortaz with HD per ID recommendations (x8 weeks)  Cholecystitis:  - Continue biliary drain management, defer  definitive Tx to IR.   Hypotension: Chronic.  - Continue regular midodrine. Mentation remains at baseline.   PAF: Bradycardic rate - Holding eliquis as above  Hypoglycemia:  - Monitoring  Tyrone Nine, MD [facility] [email or web address] 06/02/2023, 3:00 PM

## 2023-06-02 NOTE — Consult Note (Signed)
Referring Provider: No ref. provider found Primary Care Physician:  Charlynne Pander, MD Primary Gastroenterologist:  Dr. Leonides Schanz  Date of Admission: 06/01/23 Date of Consultation: 06/02/23  Reason for Consultation:  anemia   HPI:  Justin [name] is a 77 y.o. 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  Notably patient currently on outpatient antibiotics due to recent bacteremia status post [facility] catheter placement on 04/19/2023 (not a surgical candidate), ID on board with recommendations to continue IV antibiotics during hemodialysis for 6 weeks.  ED course: Hemoglobin 8.4 INR 1.4 Potassium 3.3, BUN 30 notably down from baseline Lipase 125 FOBT positive   Consult: Patient somewhat of a poor historian, not quite sure why he is here. Denies known history of anemia. Denies abdominal pain. Reports some nausea/vomiting he thinks for the past month or so. States appetite is not good for the last 6 months. Endorses weight loss, per chart review appears down from 175 in October to 147 pounds today. Endorses normal stooling but States he cannot see his stool so unclear if he has had rectal bleeding or melena.   Per nursing staff, no overt GI bleeding thus far.    Pertinent history: Recent evaluation for anemia by [facility] for GI bleed as follows -12/23 I&D perirectal abscess, Cultures +MDR E.coli. Completed several weeks of antibiotics. D/C to SNF for rehab.  - 09/10/22 CTA (-)active bleed. (+)distended GB with stones vs sludge.  --09/11/22 EGD "small stomach nodule," 2 duodenal polyps. No biopsies and none removed due to concern for acute GIB. Colonoscopy 15mm polyp in cecum. 10mm polyp in transverse colon  s/p removal. Path +tubular adenoma. Rectal AVMs/radiation proctitis s/p APC thought to be source of bleed. Plans were made for repeat EGD after outpatient follow up with GI. Advised to resume Eliquis after D/C. --09/17/22 colonoscopy recurrent bleeding of rectal AVMs s/p APC. Stigmata of bleeding from prior polyp removal s/p clips. Advised to resume Eliquis 5 days after D/C.  -Underwent EGD 4/1 [place] that revealed no abnormalities apart from submucosal gastric mass.  Biopsies were done.  Biopsies showed inconclusive result per pathologist who recommended deeper biopsy samples in the future 11/10/2022-small bowel capsule endoscopy, possible gastric AVM-tiny Last EGD:12/2022 - Z-line regular, 38 cm from the incisors.                           - No gross lesions in the entire esophagus.                           - Sub epithelial nodule/Gastric varices, without                            bleeding. Clip was placed for marking.                           - Normal examined duodenum.                           - No specimens collected.  Last Colonoscopy: 12/2022 - One 20 mm polyp in the cecum. Resection not  attempted.                           - A single (solitary) ulcer in the rectum. Clips                            (MR conditional) were placed.                           - A single erosion in the rectum. Clip was placed.                           - No specimens collected.   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) 12/07/2019   Dialysis patient [facility])    ESRD (end stage renal disease) on dialysis (HCC)    Gout     Hypertension    Hypotension 06/13/2022   Right posterior capsular opacification 02/27/2021   Septic shock (HCC)    Vitreous hemorrhage of right eye (HCC) 10/24/2019    Past Surgical History:  Procedure Laterality Date   A/V FISTULAGRAM Left 08/17/2017   Procedure: A/V FISTULAGRAM;  Surgeon: Renford Dills, MD;  Location: [facility] INVASIVE CV LAB;  Service: Cardiovascular;  Laterality: Left;   A/V SHUNT INTERVENTION N/A 08/17/2017   Procedure: A/V SHUNT INTERVENTION;  Surgeon: Renford Dills, MD;  Location: [facility] INVASIVE CV LAB;  Service: Cardiovascular;  Laterality: N/A;   AV FISTULA PLACEMENT Left 05/31/2015   Procedure: ARTERIOVENOUS (AV) FISTULA CREATION;  Surgeon: Renford Dills, MD;  Location: [facility] ORS;  Service: Vascular;  Laterality: Left;   COLONOSCOPY N/A 12/17/2022   Procedure: COLONOSCOPY;  Surgeon: Napoleon Form, MD;  Location: [facility] ENDOSCOPY;  Service: Gastroenterology;  Laterality: N/A;   COLONOSCOPY WITH PROPOFOL N/A 09/11/2022   Procedure: COLONOSCOPY WITH PROPOFOL;  Surgeon: Imogene Burn, MD;  Location: [facility] ENDOSCOPY;  Service: Gastroenterology;  Laterality: N/A;   COLONOSCOPY WITH PROPOFOL N/A 09/17/2022   Procedure: COLONOSCOPY WITH PROPOFOL;  Surgeon: Benancio Deeds, MD;  Location: [facility] ENDOSCOPY;  Service: Gastroenterology;  Laterality: N/A;   ESOPHAGOGASTRODUODENOSCOPY (EGD) WITH PROPOFOL N/A 09/11/2022   Procedure: ESOPHAGOGASTRODUODENOSCOPY (EGD) WITH PROPOFOL;  Surgeon: Imogene Burn, MD;  Location: [facility] ENDOSCOPY;  Service: Gastroenterology;  Laterality: N/A;   ESOPHAGOGASTRODUODENOSCOPY (EGD) WITH PROPOFOL N/A 12/17/2022   Procedure: ESOPHAGOGASTRODUODENOSCOPY (EGD) WITH PROPOFOL;  Surgeon: Napoleon Form, MD;  Location: [facility] ENDOSCOPY;  Service: Gastroenterology;  Laterality: N/A;   excision bone spurs Bilateral 1989   feet   GIVENS CAPSULE STUDY N/A 11/10/2022   Procedure: GIVENS CAPSULE STUDY;  Surgeon: Shellia Cleverly, DO;  Location: [facility] ENDOSCOPY;   Service: Gastroenterology;  Laterality: N/A;   HEMOSTASIS CLIP PLACEMENT  09/11/2022   Procedure: HEMOSTASIS CLIP PLACEMENT;  Surgeon: Imogene Burn, MD;  Location: [facility] ENDOSCOPY;  Service: Gastroenterology;;   HEMOSTASIS CLIP PLACEMENT  09/17/2022   Procedure: HEMOSTASIS CLIP PLACEMENT;  Surgeon: Benancio Deeds, MD;  Location: [facility] ENDOSCOPY;  Service: Gastroenterology;;   HEMOSTASIS CLIP PLACEMENT  12/17/2022   Procedure: HEMOSTASIS CLIP PLACEMENT;  Surgeon: Napoleon Form, MD;  Location: [facility] ENDOSCOPY;  Service: Gastroenterology;;   HOT HEMOSTASIS N/A 09/11/2022   Procedure: HOT HEMOSTASIS (ARGON PLASMA COAGULATION/BICAP);  Surgeon: Imogene Burn, MD;  Location: [facility] ENDOSCOPY;  Service: Gastroenterology;  Laterality: N/A;   HOT HEMOSTASIS N/A 09/17/2022   Procedure: HOT HEMOSTASIS (ARGON PLASMA COAGULATION/BICAP);  Surgeon: Benancio Deeds, MD;  Location: [facility] ENDOSCOPY;  Service: Gastroenterology;  Laterality: N/A;   INCISION AND DRAINAGE PERIRECTAL ABSCESS N/A 06/15/2022   Procedure: IRRIGATION AND DEBRIDEMENT PERIRECTAL ABSCESS;  Surgeon: Franky Macho, MD;  Location: [facility] ORS;  Service: General;  Laterality: N/A;   IR EXCHANGE BILIARY DRAIN  03/16/2023   IR EXCHANGE BILIARY DRAIN  05/06/2023   IR FLUORO GUIDE CV LINE LEFT  04/19/2023   IR REMOVAL TUN CV CATH W/O FL  04/16/2023   IR US GUIDE VASC ACCESS LEFT  04/20/2023   IR US GUIDE VASC ACCESS RIGHT  04/19/2023   IR VENO/JUGULAR RIGHT  04/19/2023   KNEE SURGERY Left 1998   arthroscopy   POLYPECTOMY  09/11/2022   Procedure: POLYPECTOMY;  Surgeon: Imogene Burn, MD;  Location: [facility] ENDOSCOPY;  Service: Gastroenterology;;   SHOULDER SURGERY Left 1994   rotator cuff    Prior to Admission medications   Medication Sig Start Date End Date Taking? Authorizing Provider  acetaminophen (TYLENOL) 325 MG tablet Take 2 tablets (650 mg total) by mouth every 6 (six) hours as needed for mild pain (or Fever >/= 101). 11/24/22  Yes Pokhrel, Laxman, MD   amiodarone (PACERONE) 200 MG tablet Take 1 tablet (200 mg total) by mouth daily. 04/09/23  Yes Burnadette Pop, MD  apixaban (ELIQUIS) 2.5 MG TABS tablet Take 1 tablet (2.5 mg total) by mouth 2 (two) times daily. 04/08/23  Yes Burnadette Pop, MD  atorvastatin (LIPITOR) 40 MG tablet Take 40 mg by mouth at bedtime. 10/30/14  Yes [provider]  calcitRIOL (ROCALTROL) 0.25 MCG capsule Take 3 capsules (0.75 mcg total) by mouth every Monday, Wednesday, and Friday with hemodialysis. Patient taking differently: Take 0.25 mcg by mouth daily. 11/25/22  Yes Pokhrel, Rebekah Chesterfield, MD  cefTAZidime 1 g in sodium chloride 0.9 % 100 mL Inject 1 g into the vein every Monday, Wednesday, and Friday with hemodialysis. 04/21/23 07/20/23 Yes Willeen Niece, MD  Digoxin 62.5 MCG TABS Take 0.0625 mg by mouth every other day. 04/23/23 06/01/23 Yes Willeen Niece, MD  pantoprazole (PROTONIX) 40 MG tablet Take 1 tablet (40 mg total) by mouth 2 (two) times daily before a meal. Patient taking differently: Take 40 mg by mouth daily. 11/24/22  Yes Pokhrel, Laxman, MD  PERCOCET 5-325 MG tablet Take 1 tablet by mouth every 6 (six) hours as needed. 05/03/23  Yes [provider]  pregabalin (LYRICA) 75 MG capsule Take 1 capsule (75 mg total) by mouth at bedtime. Patient taking differently: Take 75 mg by mouth 2 (two) times daily. 11/24/22 11/24/23 Yes Pokhrel, Laxman, MD    Current Facility-Administered Medications  Medication Dose Route Frequency Provider Last Rate Last Admin   acetaminophen (TYLENOL) tablet 650 mg  650 mg Oral Q6H PRN Leeroy Bock, MD       Or   acetaminophen (TYLENOL) suppository 650 mg  650 mg Rectal Q6H PRN Leeroy Bock, MD       bisacodyl (DULCOLAX) EC tablet 5 mg  5 mg Oral Daily PRN Leeroy Bock, MD       calcitRIOL (ROCALTROL) capsule 0.25  mcg  0.25 mcg Oral Daily Leeroy Bock, MD   0.25 mcg at 06/02/23 0827   cefTAZidime (FORTAZ) 1 g in sodium chloride 0.9 % 100 mL  IVPB  1 g Intravenous Q M,W,F-HD Leeroy Bock, MD       Chlorhexidine Gluconate Cloth 2 % PADS 6 each  6 each Topical Daily Adefeso, Oladapo, DO   6 each at 06/02/23 0829   dextrose 50 % solution 50 mL  1 ampule Intravenous Q3H PRN Leeroy Bock, MD   50 mL at 06/02/23 0432   heparin injection 5,000 Units  5,000 Units Subcutaneous Q8H Leeroy Bock, MD   5,000 Units at 06/02/23 1610   midodrine (PROAMATINE) tablet 5 mg  5 mg Oral TID WC Leeroy Bock, MD   5 mg at 06/02/23 9604   oxyCODONE-acetaminophen (PERCOCET/ROXICET) 5-325 MG per tablet 1 tablet  1 tablet Oral Q6H PRN Leeroy Bock, MD       polyethylene glycol (MIRALAX / GLYCOLAX) packet 17 g  17 g Oral Daily Leeroy Bock, MD       pregabalin (LYRICA) capsule 75 mg  75 mg Oral QHS Leeroy Bock, MD        Allergies as of 06/01/2023   (No Known Allergies)    Review of Systems: Gen: Denies fever, chills, loss of appetite, change in weight or weight loss CV: Denies chest pain, heart palpitations, syncope, edema  Resp: Denies shortness of breath with rest, cough, wheezing GI: +nausea/vomiting, +weight loss denies melena, hematochezia, diarrhea, constipation, dysphagia, odyonophagia, early satiety  GU : Denies urinary burning, urinary frequency, urinary incontinence.  MS: Denies joint pain,swelling, cramping Derm: Denies rash, itching, dry skin Psych: Denies depression, anxiety,confusion, or memory loss Heme: Denies bruising, bleeding, and enlarged lymph nodes.  Physical Exam: Vital signs in last 24 hours: Temp:  [97 F (36.1 C)-97.9 F (36.6 C)] 97.6 F (36.4 C) (11/20 0806) Pulse Rate:  [52-75] 52 (11/20 0806) Resp:  [12-20] 16 (11/20 0806) BP: (84-129)/(41-89) 98/48 (11/20 0806) SpO2:  [78 %-100 %] 100 % (11/20 0818) Weight:  [67.1 kg-79.8 kg] 67.1 kg (11/19 2117) Last BM Date : 06/01/23 General:   Alert,  Well-developed, well-nourished, pleasant and cooperative in NAD Head:   Normocephalic and atraumatic. Eyes:  Sclera clear, no icterus.   Conjunctiva pink. Lungs:  Clear throughout to auscultation.   No wheezes, crackles, or rhonchi. No acute distress. Heart: Murmur present  Abdomen:  Soft, nontender and nondistended. No masses, hepatosplenomegaly or hernias noted. Normal bowel sounds, without guarding, and without rebound.  Biliary drain present to RUQ with some bile colored drainage present.  Rectal:  Deferred until time of colonoscopy.   Neurologic:  Alert and oriented. grossly normal neurologically. Skin:  Intact without significant lesions or rashes. Psych:  Alert and cooperative. Normal mood and affect.  Lab Results: Recent Labs    06/01/23 1418 06/01/23 1747 06/02/23 0505  WBC 7.1 7.3 5.7  HGB 8.4* 7.1* 7.3*  HCT 29.2* 24.9* 24.0*  PLT 293 291 249   BMET Recent Labs    06/01/23 1418 06/01/23 1747 06/02/23 0505  NA 140  --  140  K 3.3*  --  3.0*  CL 98  --  99  CO2 28  --  26  GLUCOSE 72  --  141*  BUN 30*  --  33*  CREATININE 5.95* 6.21* 6.64*  CALCIUM 9.6  --  9.3   LFT Recent Labs    06/01/23 1418  PROT 8.3*  ALBUMIN 2.4*  AST 16  ALT 11  ALKPHOS 59  BILITOT 0.9   PT/INR Recent Labs    06/01/23 1418  LABPROT 17.6*  INR 1.4*    Impression: Justin [name] is a 77 year old male with medical history significant for end-stage renal disease on hemodialysis Monday, Wednesday, Friday, GI bleed, 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   Acute on chronic Anemia:hgb down to 7.1, from 8.4 on admission, appears baseline is around 9-10 range. FOBT positive. Hgb 7.3 this morning, 2 units of blood ordered, but not yet transfused. Patient is unsure of rectal bleeding or melena though staff reports no overt GI bleeding this admission. patient  Denies abdominal pain but endorses nausea, vomiting and weight loss. 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 Given no overt GI bleeding, would recommend monitoring h&h closely, transfuse as needed, should hold Eliquis for now, in light of suspected GI bleed. Recommend close monitoring of h&h and agree with transfusion of 2 units of PRBCs. Ideally patient should keep scheduled Colonoscopy/EUS at the end of next month. Case discussed with Dr. Jena Gauss, No plans for inpatient endoscopic evaluations at this time unless patient were to have overt GI bleeding or continued significant decline in hemoglobin   Plan: Trend h&h, transfuse for hgb <7 Monitor for overt GI bleeding Agree with transfusion of 2 units PRBCs Keep scheduled Colonoscopy/EUS on 12/30 Anti emetics per hospitalist  Hold Eliquis for now   LOS: 1 day    06/02/2023, 9:06 AM  Angeles Paolucci L. Jeanmarie Hubert, MSN, APRN, AGNP-C Adult-Gerontology Nurse Practitioner [facility] [place] [facility]

## 2023-06-03 DIAGNOSIS — K922 Gastrointestinal hemorrhage, unspecified: Secondary | ICD-10-CM | POA: Diagnosis not present

## 2023-06-03 DIAGNOSIS — D649 Anemia, unspecified: Secondary | ICD-10-CM | POA: Diagnosis not present

## 2023-06-03 LAB — CBC
HCT: 23.9 % — ABNORMAL LOW (ref 39.0–52.0)
HCT: 23.5 % — ABNORMAL LOW (ref 39.0–52.0)
Hemoglobin: 7.3 g/dL — ABNORMAL LOW (ref 13.0–17.0)
Hemoglobin: 7.2 g/dL — ABNORMAL LOW (ref 13.0–17.0)
MCH: 30.3 pg (ref 26.0–34.0)
MCH: 30.4 pg (ref 26.0–34.0)
MCHC: 30.5 g/dL (ref 30.0–36.0)
MCHC: 30.6 g/dL (ref 30.0–36.0)
MCV: 99.2 fL (ref 80.0–100.0)
MCV: 99.2 fL (ref 80.0–100.0)
Platelets: 247 10*3/uL (ref 150–400)
Platelets: 289 10*3/uL (ref 150–400)
RBC: 2.41 MIL/uL — ABNORMAL LOW (ref 4.22–5.81)
RBC: 2.37 MIL/uL — ABNORMAL LOW (ref 4.22–5.81)
RDW: 20.5 % — ABNORMAL HIGH (ref 11.5–15.5)
RDW: 20.6 % — ABNORMAL HIGH (ref 11.5–15.5)
WBC: 6.3 10*3/uL (ref 4.0–10.5)
WBC: 6.2 10*3/uL (ref 4.0–10.5)
nRBC: 0 % (ref 0.0–0.2)
nRBC: 0 % (ref 0.0–0.2)

## 2023-06-03 LAB — RENAL FUNCTION PANEL
Albumin: 2 g/dL — ABNORMAL LOW (ref 3.5–5.0)
Albumin: 2.1 g/dL — ABNORMAL LOW (ref 3.5–5.0)
Anion gap: 11 (ref 5–15)
Anion gap: 11 (ref 5–15)
BUN: 34 mg/dL — ABNORMAL HIGH (ref 8–23)
BUN: 36 mg/dL — ABNORMAL HIGH (ref 8–23)
CO2: 31 mmol/L (ref 22–32)
CO2: 29 mmol/L (ref 22–32)
Calcium: 9.6 mg/dL (ref 8.9–10.3)
Calcium: 9.4 mg/dL (ref 8.9–10.3)
Chloride: 100 mmol/L (ref 98–111)
Chloride: 100 mmol/L (ref 98–111)
Creatinine, Ser: 7.67 mg/dL — ABNORMAL HIGH (ref 0.61–1.24)
Creatinine, Ser: 7.86 mg/dL — ABNORMAL HIGH (ref 0.61–1.24)
GFR, Estimated: 7 mL/min — ABNORMAL LOW (ref >60)
GFR, Estimated: 6 mL/min — ABNORMAL LOW (ref >60)
Glucose, Bld: 92 mg/dL (ref 70–99)
Glucose, Bld: 102 mg/dL — ABNORMAL HIGH (ref 70–99)
Phosphorus: 3.4 mg/dL (ref 2.5–4.6)
Phosphorus: 3.6 mg/dL (ref 2.5–4.6)
Potassium: 3.2 mmol/L — ABNORMAL LOW (ref 3.5–5.1)
Potassium: 3.1 mmol/L — ABNORMAL LOW (ref 3.5–5.1)
Sodium: 142 mmol/L (ref 135–145)
Sodium: 140 mmol/L (ref 135–145)

## 2023-06-03 LAB — GLUCOSE, CAPILLARY
Glucose-Capillary: 107 mg/dL — ABNORMAL HIGH (ref 70–99)
Glucose-Capillary: 59 mg/dL — ABNORMAL LOW (ref 70–99)
Glucose-Capillary: 61 mg/dL — ABNORMAL LOW (ref 70–99)
Glucose-Capillary: 65 mg/dL — ABNORMAL LOW (ref 70–99)
Glucose-Capillary: 73 mg/dL (ref 70–99)
Glucose-Capillary: 77 mg/dL (ref 70–99)
Glucose-Capillary: 94 mg/dL (ref 70–99)

## 2023-06-03 LAB — HEPATITIS B SURFACE ANTIGEN: Hepatitis B Surface Ag: NONREACTIVE

## 2023-06-03 MED ORDER — DARBEPOETIN ALFA 200 MCG/0.4ML IJ SOSY
200.0000 ug | PREFILLED_SYRINGE | Freq: Once | INTRAMUSCULAR | Status: AC
  Administered 2023-06-03: 200 ug via SUBCUTANEOUS
  Filled 2023-06-03: qty 0.4

## 2023-06-03 MED ORDER — SODIUM CHLORIDE 0.9 % IV SOLN
1.0000 g | INTRAVENOUS | Status: DC
  Administered 2023-06-03 – 2023-06-05 (×2): 1 g via INTRAVENOUS
  Filled 2023-06-03 (×2): qty 1

## 2023-06-03 MED ORDER — ALBUMIN HUMAN 25 % IV SOLN
INTRAVENOUS | Status: AC
Start: 2023-06-03 — End: ?
  Filled 2023-06-03: qty 100

## 2023-06-03 MED ORDER — ANTICOAGULANT SODIUM CITRATE 4% (200MG/5ML) IV SOLN: 5.0000 mL | Status: DC | PRN

## 2023-06-03 MED ORDER — GLUCOSE 40 % PO GEL
1.0000 | Freq: Once | ORAL | Status: AC
  Administered 2023-06-03: 31 g via ORAL
  Filled 2023-06-03: qty 1.21

## 2023-06-03 MED ORDER — MIDODRINE HCL 5 MG PO TABS
10.0000 mg | ORAL_TABLET | Freq: Three times a day (TID) | ORAL | Status: DC
  Administered 2023-06-03 – 2023-06-04 (×3): 10 mg via ORAL
  Filled 2023-06-03 (×3): qty 2

## 2023-06-03 MED ORDER — ALTEPLASE 2 MG IJ SOLR: 2.0000 mg | Freq: Once | INTRAMUSCULAR | Status: DC | PRN

## 2023-06-03 MED ORDER — LIDOCAINE-PRILOCAINE 2.5-2.5 % EX CREA: 1.0000 | TOPICAL_CREAM | CUTANEOUS | Status: DC | PRN

## 2023-06-03 MED ORDER — CHLORHEXIDINE GLUCONATE CLOTH 2 % EX PADS
6.0000 | MEDICATED_PAD | Freq: Every day | CUTANEOUS | Status: DC
  Administered 2023-06-04: 6 via TOPICAL

## 2023-06-03 MED ORDER — HEPARIN SODIUM (PORCINE) 1000 UNIT/ML IJ SOLN
INTRAMUSCULAR | Status: AC
  Filled 2023-06-03: qty 6

## 2023-06-03 MED ORDER — LIDOCAINE HCL (PF) 1 % IJ SOLN: 5.0000 mL | INTRAMUSCULAR | Status: DC | PRN

## 2023-06-03 MED ORDER — PENTAFLUOROPROP-TETRAFLUOROETH EX AERO: 1.0000 | INHALATION_SPRAY | CUTANEOUS | Status: DC | PRN

## 2023-06-03 MED ORDER — HEPARIN SODIUM (PORCINE) 1000 UNIT/ML DIALYSIS
1000.0000 [IU] | INTRAMUSCULAR | Status: DC | PRN
  Administered 2023-06-03: 4200 [IU]

## 2023-06-03 MED ORDER — ALBUMIN HUMAN 25 % IV SOLN
25.0000 g | INTRAVENOUS | Status: AC | PRN
  Administered 2023-06-03 (×2): 25 g via INTRAVENOUS

## 2023-06-03 NOTE — Progress Notes (Signed)
Hypoglycemic Event  CBG: 59  Treatment: 4 oz juice/soda x2, then 2 tubes glucose gel  Symptoms: None  Follow-up CBG: Time:2220,2252,0044 CBG Result:61,65,111  Possible Reasons for Event: Inadequate meal intake  Comments/MD notified:Dr. Opyd  Patient's blood sugar was 59 at 2156. 4 oz soda given. Retake at 2220 was 61. 4 more oz soda given and retake was 65. Patient has no IV access and refused to drink more soda so I administered 2 tubes of glucose gel at 2320. Repeat blood sugar was 111.  [name]

## 2023-06-03 NOTE — Progress Notes (Signed)
   HEMODIALYSIS TREATMENT NOTE:   Unable to tolerate UF even at minimal rate despite Midodrine pre-HD and Albumin 25g x 2.  Additionally, two NS boluses were given when SBP dropped below 80.  All blood was returned. Net UF +700 ml.  Ceftazidime was given during last 30 minutes of therapy.    06/03/23 1910  Vitals  Temp 97.8 F (36.6 C)  Temp Source Oral  BP (!) 111/57  MAP (mmHg) 74  BP Location Right Arm  BP Method Automatic  Patient Position (if appropriate) Lying  Pulse Rate (!) 54  Pulse Rate Source Monitor  ECG Heart Rate (!) 54  Resp 16  Oxygen Therapy  SpO2 100 %  O2 Device Room Air  Post Treatment  Dialyzer Clearance Lightly streaked  Hemodialysis Intake (mL) 700 mL  Liters Processed 73.8  Fluid Removed (mL) 0 mL  Tolerated HD Treatment No (Comment)  Post-Hemodialysis Comments Unable to tolerate even minimal UF  Hemodialysis Catheter Left Subclavian  No placement date or time found.   Placed prior to admission: Yes  Orientation: Left  Access Location: Subclavian  Site Condition No complications  Blue Lumen Status Flushed;Heparin locked;Dead end cap in place  Red Lumen Status Flushed;Heparin locked;Dead end cap in place  Purple Lumen Status N/A  Catheter fill solution Heparin 1000 units/ml  Catheter fill volume (Arterial) 2.1 cc  Catheter fill volume (Venous) 2.1  Dressing Type Transparent;Tube stabilization device  Dressing Status Antimicrobial disc in place;Clean, Dry, Intact  Interventions New dressing  Drainage Description None  Dressing Change Due 06/10/23  Post treatment catheter status Capped and Clamped   Arman Filter, RN [facility]

## 2023-06-03 NOTE — Plan of Care (Signed)
  Problem: Acute Rehab PT Goals(only PT should resolve) Goal: Pt Will Go Supine/Side To Sit Outcome: Progressing Flowsheets (Taken 06/03/2023 1149) Pt will go Supine/Side to Sit: with moderate assist Goal: Patient Will Transfer Sit To/From Stand Outcome: Progressing Flowsheets (Taken 06/03/2023 1149) Patient will transfer sit to/from stand:  with moderate assist  with maximum assist Goal: Pt Will Transfer Bed To Chair/Chair To Bed Outcome: Progressing Flowsheets (Taken 06/03/2023 1149) Pt will Transfer Bed to Chair/Chair to Bed:  with mod assist  with max assist Goal: Pt Will Ambulate Outcome: Progressing Flowsheets (Taken 06/03/2023 1149) Pt will Ambulate:  10 feet  with moderate assist  with maximum assist  with rolling walker   11:50 AM, 06/03/23 Justin [name], MPT Physical Therapist with [facility] [phone] office [phone] mobile phone

## 2023-06-03 NOTE — Progress Notes (Signed)
Gastroenterology Progress Note   Referring Provider: No ref. provider found Primary Care Physician:  Charlynne Pander, MD Primary Gastroenterologist:  Dr. Leonides Schanz  Patient ID: Justin [name]; [id number]; 08/12/1945   Subjective:    Doesn't feel well. Some nausea. No other complaints. He is unsure where he is.   Objective:   Vital signs in last 24 hours: Temp:  [97.6 F (36.4 C)] 97.6 F (36.4 C) (11/21 0347) Pulse Rate:  [54-58] 57 (11/21 0347) Resp:  [16-18] 17 (11/21 0347) BP: (80-105)/(40-60) 105/60 (11/21 0347) SpO2:  [91 %-95 %] 91 % (11/21 0347) Last BM Date : 06/01/23 General:   Alert,  chronically ill appearing male in NAD Head:  Normocephalic and atraumatic. Eyes:  Sclera clear, no icterus.   Abdomen:  Soft, nontender and nondistended. No masses, hepatosplenomegaly or hernias noted. Normal bowel sounds, without guarding, and without rebound.  Biliary drain with bile colored drainage present Extremities:  Without clubbing, deformity or edema. Neurologic:  Alert and  oriented to self.  Intake/Output from previous day: No intake/output data recorded. Intake/Output this shift: No intake/output data recorded.  Lab Results: CBC Recent Labs    06/01/23 1747 06/02/23 0505 06/02/23 1539 06/03/23 0447  WBC 7.3 5.7  --  6.3  HGB 7.1* 7.3* 7.3* 7.3*  HCT 24.9* 24.0* 25.4* 23.9*  MCV 101.2* 100.0  --  99.2  PLT 291 249  --  247   BMET Recent Labs    06/01/23 1418 06/01/23 1747 06/02/23 0505 06/03/23 0447  NA 140  --  140 142  K 3.3*  --  3.0* 3.2*  CL 98  --  99 100  CO2 28  --  26 31  GLUCOSE 72  --  141* 92  BUN 30*  --  33* 34*  CREATININE 5.95* 6.21* 6.64* 7.67*  CALCIUM 9.6  --  9.3 9.6   LFTs Recent Labs    06/01/23 1418 06/03/23 0447  BILITOT 0.9  --   ALKPHOS 59  --   AST 16  --   ALT 11  --   PROT 8.3*  --   ALBUMIN 2.4* 2.0*   Recent Labs    06/01/23 1418  LIPASE 125*   PT/INR Recent Labs    06/01/23 1418  LABPROT 17.6*  INR  1.4*         Imaging Studies: IR EXCHANGE BILIARY DRAIN  Result Date: 05/06/2023 INDICATION: 77 year old male referred for routine exchange of percutaneous cholecystostomy EXAM: IMAGE GUIDED EXCHANGE OF PERCUTANEOUS CHOLECYSTOSTOMY MEDICATIONS: None ANESTHESIA/SEDATION: None FLUOROSCOPY: Radiation Exposure Index (as provided by the fluoroscopic device): 5 mGy Kerma COMPLICATIONS: None PROCEDURE: Informed written consent was obtained from the patient after a thorough discussion of the procedural risks, benefits and alternatives. All questions were addressed. Maximal Sterile Barrier Technique was utilized including caps, mask, sterile gowns, sterile gloves, sterile drape, hand hygiene and skin antiseptic. A timeout was performed prior to the initiation of the procedure. Patient is position under the image intensifier. The right upper quadrant and the indwelling percutaneous cholecystostomy were prepped and draped in the usual sterile fashion. 1% lidocaine was used for local anesthesia. Using modified Seldinger technique, the drain was exchanged for a new 10 French percutaneous cholecystostomy. Drain was position in the gallbladder lumen. Contrast opacified the lumen confirming position. Catheter was sutured in position and attached to gravity drain. Patient tolerated the procedure well and remained hemodynamically stable throughout. No complications were encountered and no significant blood loss. IMPRESSION: Status post image guided routine  exchange of percutaneous cholecystostomy Signed, Yvone Neu. Miachel Roux, RPVI [facility] Electronically Signed   By: Gilmer Mor D.O.   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    Acute on chronic Anemia:hgb down to 7.1, from 8.4 on admission, appears baseline is around 9-10 range. FOBT positive. Hgb 7.3 this morning, 2 units of blood ordered, but transfusion cancelled. Patient is unsure of rectal bleeding or melena though staff reports no overt GI bleeding so far this admission.  Patient denies abdominal pain but endorses nausea, vomiting and weight loss. 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   Given no overt GI bleeding, would recommend monitoring h&h closely, transfuse as needed, should hold Eliquis for now, in light of suspected GI bleed.  Ideally patient should keep scheduled Colonoscopy/EUS at the end of next month.  No plans for inpatient endoscopic evaluations at this time unless patient were to have overt GI bleeding or continued significant decline in hemoglobin    Plan:   Keep scheduled colonoscopy/EUS on 12/30. Trend H/H and transfuse as needed, H/H will need to monitored by PCP, nephrology, or primary GI after discharge as well.  Ok to advance diet.  GI to sign off, call with any questions.    LOS: 2 days   Leanna Battles. Dixon Boos [facility] [phone] 11/21/20248:41 AM

## 2023-06-03 NOTE — Progress Notes (Signed)
[facility] PROGRESS NOTE  Justin [name] (DOB: May 26, 1946) ZOX:[id number] PCP: Charlynne Pander, MD  Brief Narrative: Justin [name] is a 77 y.o. 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 Hgb has remained stable at 7.3g/dl. GI recommends outpatient follow up as already arranged.   Subjective: Has no complaints, specifically denied shortness of breath, chest pain palpitations, lightheadedness, or increased weakness. He declined wound check.  Objective: BP 105/60 (BP Location: Right Wrist)   Pulse (!) 57   Temp 97.6 F (36.4 C) (Axillary)   Resp 17   Ht 5\' 10"  (1.778 m)   Wt 67.1 kg   SpO2 91%   BMI 21.23 kg/m   Gen: No distress, chronically ill-appearing Pulm: Clear, nonlabored  CV: Regular with stable holosystolic murmur throughout precordium.  GI: Soft, NT, ND, +BS. R biliary tube site c/d/i Neuro: Alert and oriented. No new focal deficits. Ext: Warm, no deformities. Skin: Left [facility]DC appears c/d/I. Other than that, dressing without exudate but not taken down per pt request.   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 - No gross/active bleeding currently, GI recommends follow up with [facility] as previously scheduled. 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 Advance diet.  - Hgb has not met transfusion threshold, though pt does consent, will keep T&S UTD.   - Holding anticoagulation (maybe should stop this given hx bleeding gastric varices and indication is  AFib).  - ESA today per nephrology. Hold anticoagulation as above.   ESRD:  - Continue TTS schedule thru [facility], nephrology consultation appreciated.   Ischial osteomyelitis:  - Continue fortaz with HD per ID recommendations (x8 weeks)  Cholecystitis:  - Continue biliary drain management, defer definitive Tx to IR.   Hypotension: Chronic, remains low.  - Increase midodrine.  PAF: Bradycardic rate - Holding eliquis as above  Hypoglycemia:  - Monitoring  Tyrone Nine, MD [facility] [email or web address] 06/03/2023, 1:38 PM

## 2023-06-03 NOTE — Evaluation (Signed)
Physical Therapy Evaluation Patient Details Name: Justin [name] MRN: [id number] DOB: 04-04-1946 Today's Date: 06/03/2023  History of Present Illness  Justin [name] is a 77 y.o. male with a PMH significant for ESRD on HD MWF with left subclavian temp cath, diabetes mellitus, bedbound, A-fib on Eliquis, history of GI bleed, HTN, prolonged QT interval, aortic valve stenosis, HLD, gout, history of prostate cancer, biliary drain from cholecystitis, GERD, hypotension, history of bleeding gastric varices.  Currently receiving Fortaz with HD for recent admission with ischial osteomyelitis as instructed by ID.  At baseline, they live at a [facility] nursing home and are mostly dependent for ADLs.     They presented from nursing home to the ED on 06/01/2023 with reported drop in hemoglobin to 7.1.  He denies any known bleeding.  He states that his bowel movements have been regular and he does not know what color they have been.  He attends his normally scheduled dialysis with last session yesterday which proceeded normally.  He is unaware of the facility's reason for sending him to the hospital.  He states that he is here because he has low appetite with intermittent nausea and vomiting for the past 2 months.  He denies current nausea.  Endorses being able to eat breakfast today without any vomiting.  He feels very hungry currently.  Upon EMS arrival to the nursing facility, he was found to be hypoglycemic with CBG of 60 and was given EMS and route.   Clinical Impression  Patient demonstrates slow labored movement for sitting up at bedside with frequent leaning to the left due to weakness, limited to partially standing with RW due to BLE weakness with inability to lock knees and required Max assist stand pivot with left knee blocked to transfer to char.  Patient tolerated sitting up in chair with mechanical lift sling in place after therapy - nursing staff aware.  Patient will benefit from continued skilled  physical therapy in hospital and recommended venue below to increase strength, balance, endurance for safe ADLs and gait.           If plan is discharge home, recommend the following: A lot of help with bathing/dressing/bathroom;A lot of help with walking and/or transfers;Help with stairs or ramp for entrance;Assistance with cooking/housework   Can travel by private vehicle   No    Equipment Recommendations None recommended by PT  Recommendations for Other Services       Functional Status Assessment Patient has had a recent decline in their functional status and demonstrates the ability to make significant improvements in function in a reasonable and predictable amount of time.     Precautions / Restrictions Precautions Precautions: Fall Restrictions Weight Bearing Restrictions: No      Mobility  Bed Mobility Overal bed mobility: Needs Assistance Bed Mobility: Supine to Sit     Supine to sit: Mod assist, Max assist     General bed mobility comments: increased time, labored movement    Transfers Overall transfer level: Needs assistance Equipment used: 1 person hand held assist Transfers: Sit to/from Stand, Bed to chair/wheelchair/BSC Sit to Stand: Max assist Stand pivot transfers: Max assist         General transfer comment: patient unable to transfer using RW due to BLE weakness, required Max stand pivot with left knee blocked    Ambulation/Gait                  Stairs  Wheelchair Mobility     Tilt Bed    Modified Rankin (Stroke Patients Only)       Balance Overall balance assessment: Needs assistance Sitting-balance support: Feet supported, Bilateral upper extremity supported Sitting balance-Leahy Scale: Fair Sitting balance - Comments: seated at EOB   Standing balance support: Reliant on assistive device for balance, During functional activity, Bilateral upper extremity supported Standing balance-Leahy Scale:  Zero Standing balance comment: unable to stand using RW                             Pertinent Vitals/Pain Pain Assessment Pain Assessment: Faces Faces Pain Scale: Hurts even more Pain Location: left lower leg/foot Pain Descriptors / Indicators: Grimacing, Discomfort, Guarding, Sharp Pain Intervention(s): Limited activity within patient's tolerance, Monitored during session, Repositioned    Home Living Family/patient expects to be discharged to:: Private residence Living Arrangements: Other relatives Available Help at Discharge: Family;Available PRN/intermittently Type of Home: Apartment Home Access: Stairs to enter Entrance Stairs-Rails: Right Entrance Stairs-Number of Steps: 3   Home Layout: One level Home Equipment: Agricultural consultant (2 wheels);Cane - single point;Shower seat;Wheelchair - manual;BSC/3in1 Additional CommentsEnvironmental manager    Prior Function Prior Level of Function : Needs assist       Physical Assist : Mobility (physical);ADLs (physical) Mobility (physical): Bed mobility;Transfers;Stairs;Gait   Mobility Comments: recently using sara lift for transfers at SNF, was ambulating beginning of this year after rehab ADLs Comments: Assisted by SNF staff     Extremity/Trunk Assessment   Upper Extremity Assessment Upper Extremity Assessment: Generalized weakness    Lower Extremity Assessment Lower Extremity Assessment: Generalized weakness;RLE deficits/detail;LLE deficits/detail RLE Deficits / Details: grossly -3/5 RLE Sensation: WNL RLE Coordination: WNL LLE Deficits / Details: grossly -2/5 LLE: Unable to fully assess due to pain LLE Sensation: WNL LLE Coordination: WNL    Cervical / Trunk Assessment Cervical / Trunk Assessment: Kyphotic  Communication   Communication Communication: No apparent difficulties  Cognition Arousal: Alert Behavior During Therapy: WFL for tasks assessed/performed Overall Cognitive Status: Within Functional Limits for  tasks assessed                                          General Comments      Exercises     Assessment/Plan    PT Assessment Patient needs continued PT services  PT Problem List Decreased strength;Decreased activity tolerance;Decreased balance;Decreased mobility       PT Treatment Interventions DME instruction;Functional mobility training;Therapeutic activities;Therapeutic exercise;Balance training;Patient/family education;Wheelchair mobility training;Gait training    PT Goals (Current goals can be found in the Care Plan section)  Acute Rehab PT Goals Patient Stated Goal: return home with family to assist PT Goal Formulation: With patient Time For Goal Achievement: 06/17/23 Potential to Achieve Goals: Fair    Frequency Min 3X/week     Co-evaluation               AM-PAC PT "6 Clicks" Mobility  Outcome Measure Help needed turning from your back to your side while in a flat bed without using bedrails?: A Lot Help needed moving from lying on your back to sitting on the side of a flat bed without using bedrails?: A Lot Help needed moving to and from a bed to a chair (including a wheelchair)?: A Lot Help needed standing up from a chair using your arms (  e.g., wheelchair or bedside chair)?: A Lot Help needed to walk in hospital room?: Total Help needed climbing 3-5 steps with a railing? : Total 6 Click Score: 10    End of Session   Activity Tolerance: Patient tolerated treatment well;Patient limited by fatigue;Patient limited by pain Patient left: in chair;with call bell/phone within reach;Other (comment) (mechanical lift sling in place) Nurse Communication: Mobility status PT Visit Diagnosis: Unsteadiness on feet (R26.81);Other abnormalities of gait and mobility (R26.89);Muscle weakness (generalized) (M62.81)    Time: 1610-9604 PT Time Calculation (min) (ACUTE ONLY): 30 min   Charges:   PT Evaluation $PT Eval Moderate Complexity: 1 Mod PT  Treatments $Therapeutic Activity: 23-37 mins PT General Charges $$ ACUTE PT VISIT: 1 Visit         11:48 AM, 06/03/23 Ocie Bob, MPT Physical Therapist with [facility] [phone] office [phone] mobile phone

## 2023-06-03 NOTE — Consult Note (Signed)
Reason for Consult: To manage dialysis and dialysis related needs Referring Physician: Dr Arlina Robes is an 77 y.o. male.  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    Pt is now living at [facility] in [place] d/t recent prolonged admission to [facility].  Was brought in yesterday d/t low Hgb of 7.1.  Pt denies recent bleeding.  Hemoccult was +.  GI has been consulted, no plan for urgent endoscopic eval at this time.    In this setting we are asked to see.    Pt denies any discomfort at this time.    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) 12/07/2019   Dialysis patient [facility])    ESRD (end stage renal disease) on dialysis (HCC)    Gout    Hypertension    Hypotension 06/13/2022   Right posterior capsular opacification 02/27/2021   Septic shock (HCC)    Vitreous hemorrhage of right eye (HCC) 10/24/2019    Past Surgical History:  Procedure Laterality Date   A/V FISTULAGRAM Left 08/17/2017   Procedure: A/V FISTULAGRAM;  Surgeon: Renford Dills, MD;  Location: [facility] INVASIVE CV LAB;  Service: Cardiovascular;  Laterality: Left;   A/V SHUNT INTERVENTION N/A 08/17/2017   Procedure: A/V SHUNT INTERVENTION;  Surgeon: Renford Dills, MD;  Location: [facility] INVASIVE CV LAB;   Service: Cardiovascular;  Laterality: N/A;   AV FISTULA PLACEMENT Left 05/31/2015   Procedure: ARTERIOVENOUS (AV) FISTULA CREATION;  Surgeon: Renford Dills, MD;  Location: [facility] ORS;  Service: Vascular;  Laterality: Left;   COLONOSCOPY N/A 12/17/2022   Procedure: COLONOSCOPY;  Surgeon: Napoleon Form, MD;  [facility] ENDOSCOPY;  Service: Gastroenterology;  Laterality: N/A;   COLONOSCOPY WITH PROPOFOL N/A 09/11/2022   Procedure: COLONOSCOPY WITH PROPOFOL;  Surgeon: Imogene Burn, MD;  Location: [facility] ENDOSCOPY;  Service: Gastroenterology;  Laterality: N/A;   COLONOSCOPY WITH PROPOFOL N/A 09/17/2022   Procedure: COLONOSCOPY WITH PROPOFOL;  Surgeon: Benancio Deeds, MD;  Location: [facility] ENDOSCOPY;  Service: Gastroenterology;  Laterality: N/A;   ESOPHAGOGASTRODUODENOSCOPY (EGD) WITH PROPOFOL N/A 09/11/2022   Procedure: ESOPHAGOGASTRODUODENOSCOPY (EGD) WITH PROPOFOL;  Surgeon: Imogene Burn, MD;  Location[facility] ENDOSCOPY;  Service: Gastroenterology;  Laterality: N/A;   ESOPHAGOGASTRODUODENOSCOPY (EGD) WITH PROPOFOL N/A 12/17/2022   Procedure: ESOPHAGOGASTRODUODENOSCOPY (EGD) WITH PROPOFOL;  Surgeon: Napoleon Form, MD;  Location: [facility] ENDOSCOPY;  Service: Gastroenterology;  Laterality: N/A;   excision bone spurs Bilateral 1989   feet   GIVENS CAPSULE STUDY N/A 11/10/2022   Procedure: GIVENS CAPSULE STUDY;  Surgeon: Shellia Cleverly, DO;  Location: [facility] ENDOSCOPY;  Service: Gastroenterology;  Laterality: N/A;   HEMOSTASIS CLIP PLACEMENT  09/11/2022   Procedure: HEMOSTASIS CLIP PLACEMENT;  Surgeon: Imogene Burn, MD;  Location: [facility] ENDOSCOPY;  Service: Gastroenterology;;   HEMOSTASIS CLIP PLACEMENT  09/17/2022   Procedure: HEMOSTASIS CLIP PLACEMENT;  Surgeon: Benancio Deeds, MD;  Location: [facility] ENDOSCOPY;  Service: Gastroenterology;;   HEMOSTASIS CLIP PLACEMENT  12/17/2022   Procedure: HEMOSTASIS CLIP PLACEMENT;  Surgeon: Napoleon Form, MD;  Location: [facility] ENDOSCOPY;  Service: Gastroenterology;;    HOT HEMOSTASIS N/A 09/11/2022   Procedure: HOT HEMOSTASIS (ARGON PLASMA COAGULATION/BICAP);  Surgeon: Imogene Burn, MD;  Location: [facility] ENDOSCOPY;  Service: Gastroenterology;  Laterality: N/A;   HOT HEMOSTASIS N/A 09/17/2022   Procedure: HOT HEMOSTASIS (ARGON PLASMA COAGULATION/BICAP);  Surgeon: Benancio Deeds, MD;  Location: [facility] ENDOSCOPY;  Service: Gastroenterology;  Laterality: N/A;   INCISION AND DRAINAGE PERIRECTAL ABSCESS N/A 06/15/2022   Procedure: IRRIGATION AND DEBRIDEMENT PERIRECTAL ABSCESS;  Surgeon: Franky Macho, MD;  Location: [facility] ORS;  Service: General;  Laterality: N/A;   IR EXCHANGE BILIARY DRAIN  03/16/2023   IR EXCHANGE BILIARY DRAIN  05/06/2023   IR FLUORO GUIDE CV LINE LEFT  04/19/2023   IR REMOVAL TUN CV CATH W/O FL  04/16/2023   IR US GUIDE VASC ACCESS LEFT  04/20/2023   IR US GUIDE VASC ACCESS RIGHT  04/19/2023   IR VENO/JUGULAR RIGHT  04/19/2023   KNEE SURGERY Left 1998   arthroscopy   POLYPECTOMY  09/11/2022   Procedure: POLYPECTOMY;  Surgeon: Imogene Burn, MD;  Location: [facility] ENDOSCOPY;  Service: Gastroenterology;;   SHOULDER SURGERY Left 1994   rotator cuff    Family History  Problem Relation Age of Onset   Kidney disease Mother    Alcoholism Father    Alcoholism Brother    Colon cancer Neg Hx    Stomach cancer Neg Hx    Esophageal cancer Neg Hx     Social History:  reports that he quit smoking about 44 years ago. His smoking use included cigarettes. He has never used smokeless tobacco. He reports that he does not drink alcohol and does not use drugs.  Allergies: No Known Allergies  Medications: Prior to Admission:  Medications Prior to Admission  Medication Sig Dispense Refill Last Dose   acetaminophen (TYLENOL) 325 MG tablet Take 2 tablets (650 mg total) by mouth every 6 (six) hours as needed for mild pain (or Fever >/= 101).   Past Month   amiodarone (PACERONE) 200 MG tablet Take 1 tablet (200 mg total) by mouth daily. 60 tablet 0 06/01/2023   apixaban  (ELIQUIS) 2.5 MG TABS tablet Take 1 tablet (2.5 mg total) by mouth 2 (two) times daily. 120 tablet 0 06/01/2023 at 0430   atorvastatin (LIPITOR) 40 MG tablet Take 40 mg by mouth at bedtime.   05/31/2023   calcitRIOL (ROCALTROL) 0.25 MCG capsule Take 3 capsules (0.75 mcg total) by mouth every Monday, Wednesday, and Friday with hemodialysis. (Patient taking differently: Take 0.25 mcg by mouth daily.)   05/31/2023   cefTAZidime 1 g in sodium chloride 0.9 % 100 mL Inject 1 g into the vein every Monday, Wednesday, and Friday with hemodialysis. 18 vial 0 05/31/2023   Digoxin 62.5 MCG TABS Take 0.0625 mg by mouth every other day. 15 tablet 0 06/01/2023   pantoprazole (PROTONIX) 40 MG tablet Take 1 tablet (40 mg total) by mouth 2 (two) times daily before a meal. (Patient taking differently: Take 40 mg by mouth daily.)   06/01/2023   PERCOCET 5-325 MG tablet Take 1 tablet by mouth every 6 (six) hours as needed.  05/31/2023   pregabalin (LYRICA) 75 MG capsule Take 1 capsule (75 mg total) by mouth at bedtime. (Patient taking differently: Take 75 mg by mouth 2 (two) times daily.) 5 capsule 0 06/01/2023    Results for orders placed or performed during the hospital encounter of 06/01/23 (from the past 48 hour(s))  CBG monitoring, ED     Status: Abnormal   Collection Time: 06/01/23  2:07 PM  Result Value Ref Range   Glucose-Capillary 65 (L) 70 - 99 mg/dL    Comment: Glucose reference range applies only to samples taken after fasting for at least 8 hours.  CBC with Differential     Status: Abnormal   Collection Time: 06/01/23  2:18 PM  Result Value Ref Range   WBC 7.1 4.0 - 10.5 K/uL   RBC 2.86 (L) 4.22 - 5.81 MIL/uL   Hemoglobin 8.4 (L) 13.0 - 17.0 g/dL   HCT 56.2 (L) 13.0 - 86.5 %   MCV 102.1 (H) 80.0 - 100.0 fL   MCH 29.4 26.0 - 34.0 pg   MCHC 28.8 (L) 30.0 - 36.0 g/dL   RDW 78.4 (H) 69.6 - 29.5 %   Platelets 293 150 - 400 K/uL   nRBC 0.0 0.0 - 0.2 %   Neutrophils Relative % 77 %   Neutro Abs 5.5  1.7 - 7.7 K/uL   Lymphocytes Relative 14 %   Lymphs Abs 1.0 0.7 - 4.0 K/uL   Monocytes Relative 8 %   Monocytes Absolute 0.5 0.1 - 1.0 K/uL   Eosinophils Relative 1 %   Eosinophils Absolute 0.1 0.0 - 0.5 K/uL   Basophils Relative 0 %   Basophils Absolute 0.0 0.0 - 0.1 K/uL   Immature Granulocytes 0 %   Abs Immature Granulocytes 0.02 0.00 - 0.07 K/uL    Comment: Performed at [facility], [address], [place], [place] [address]  Type and screen [facility]     Status: None (Preliminary result)   Collection Time: 06/01/23  2:18 PM  Result Value Ref Range   ABO/RH(D) O POS    Antibody Screen NEG    Sample Expiration 06/04/2023,2359    Unit Number [id number]    Blood Component Type RED CELLS,LR    Unit division 00    Status of Unit ALLOCATED    Transfusion Status OK TO TRANSFUSE    Crossmatch Result      COMPATIBLE Performed at [facility], [address], [place], [place] [address]    Unit Number [id number]    Blood Component Type RED CELLS,LR    Unit division 00    Status of Unit ALLOCATED    Transfusion Status OK TO TRANSFUSE    Crossmatch Result COMPATIBLE   Protime-INR     Status: Abnormal   Collection Time: 06/01/23  2:18 PM  Result Value Ref Range   Prothrombin Time 17.6 (H) 11.4 - 15.2 seconds   INR 1.4 (H) 0.8 - 1.2    Comment: (NOTE) INR goal varies based on device and disease states. Performed at [facility], [address], [place], [place] [address]   Comprehensive metabolic panel     Status: Abnormal   Collection Time: 06/01/23  2:18 PM  Result Value Ref Range   Sodium 140 135 - 145 mmol/L   Potassium 3.3 (L) 3.5 - 5.1 mmol/L   Chloride 98 98 - 111 mmol/L   CO2 28 22 - 32 mmol/L   Glucose, Bld 72 70 - 99 mg/dL    Comment:  Glucose reference range applies only to samples taken after fasting for at least 8 hours.   BUN 30 (H) 8 - 23 mg/dL   Creatinine, Ser 1.61 (H) 0.61 - 1.24 mg/dL   Calcium 9.6 8.9 - 09.6 mg/dL   Total Protein 8.3 (H) 6.5  - 8.1 g/dL   Albumin 2.4 (L) 3.5 - 5.0 g/dL   AST 16 15 - 41 U/L   ALT 11 0 - 44 U/L   Alkaline Phosphatase 59 38 - 126 U/L   Total Bilirubin 0.9 <1.2 mg/dL   GFR, Estimated 9 (L) >60 mL/min    Comment: (NOTE) Calculated using the CKD-EPI Creatinine Equation (2021)    Anion gap 14 5 - 15    Comment: Performed at [facility], [address], [place], [place] [address]  Lipase, blood     Status: Abnormal   Collection Time: 06/01/23  2:18 PM  Result Value Ref Range   Lipase 125 (H) 11 - 51 U/L    Comment: Performed at [facility], [address] [place], [place] [address]  Lactic acid, plasma     Status: None   Collection Time: 06/01/23  2:18 PM  Result Value Ref Range   Lactic Acid, Venous 1.2 0.5 - 1.9 mmol/L    Comment: Performed at [facility], [address], [place], [place] [address]  Culture, blood (routine x 2)     Status: None (Preliminary result)   Collection Time: 06/01/23  2:18 PM   Specimen: BLOOD  Result Value Ref Range   Specimen Description BLOOD BLOOD RIGHT ARM    Special Requests      Blood Culture adequate volume BOTTLES DRAWN AEROBIC AND ANAEROBIC   Culture      NO GROWTH < 24 HOURS Performed at [facility], [address] [place], [place] [address]    Report Status PENDING   Culture, blood (routine x 2)     Status: None (Preliminary result)   Collection Time: 06/01/23  2:18 PM   Specimen: BLOOD  Result Value Ref Range   Specimen Description BLOOD BLOOD RIGHT HAND    Special Requests      Blood Culture results may not be optimal due to an excessive volume of blood received in culture bottles BOTTLES DRAWN AEROBIC AND ANAEROBIC   Culture      NO GROWTH < 24 HOURS Performed at [facility], [address] [place], [place] [address]    Report Status PENDING   Prepare RBC     Status: None   Collection Time: 06/01/23  2:18 PM  Result Value Ref Range   Order Confirmation      ORDER PROCESSED BY BLOOD BANK Performed at [facility], [address],  [place], [place] [address]   POC CBG, ED     Status: Abnormal   Collection Time: 06/01/23  3:45 PM  Result Value Ref Range   Glucose-Capillary 69 (L) 70 - 99 mg/dL    Comment: Glucose reference range applies only to samples taken after fasting for at least 8 hours.  Lactic acid, plasma     Status: None   Collection Time: 06/01/23  5:47 PM  Result Value Ref Range   Lactic Acid, Venous 1.1 0.5 - 1.9 mmol/L    Comment: Performed at [facility], [address] [place], [place] [address]  CBC     Status: Abnormal   Collection Time: 06/01/23  5:47 PM  Result Value Ref Range   WBC 7.3 4.0 - 10.5  K/uL   RBC 2.46 (L) 4.22 - 5.81 MIL/uL   Hemoglobin 7.1 (L) 13.0 - 17.0 g/dL   HCT 72.5 (L) 36.6 - 44.0 %   MCV 101.2 (H) 80.0 - 100.0 fL   MCH 28.9 26.0 - 34.0 pg   MCHC 28.5 (L) 30.0 - 36.0 g/dL   RDW 34.7 (H) 42.5 - 95.6 %   Platelets 291 150 - 400 K/uL   nRBC 0.0 0.0 - 0.2 %    Comment: Performed at [facility], [address], [place], [place] [address]  Creatinine, serum     Status: Abnormal   Collection Time: 06/01/23  5:47 PM  Result Value Ref Range   Creatinine, Ser 6.21 (H) 0.61 - 1.24 mg/dL   GFR, Estimated 9 (L) >60 mL/min    Comment: (NOTE) Calculated using the CKD-EPI Creatinine Equation (2021) Performed at [facility], [address], [place], [place] [address]   CBG monitoring, ED     Status: Abnormal   Collection Time: 06/01/23  8:28 PM  Result Value Ref Range   Glucose-Capillary 118 (H) 70 - 99 mg/dL    Comment: Glucose reference range applies only to samples taken after fasting for at least 8 hours.  Glucose, capillary     Status: Abnormal   Collection Time: 06/01/23  9:30 PM  Result Value Ref Range   Glucose-Capillary 117 (H) 70 - 99 mg/dL    Comment: Glucose reference range applies only to samples taken after fasting for at least 8 hours.  Glucose, capillary     Status: None   Collection Time: 06/02/23  1:01 AM  Result Value Ref Range   Glucose-Capillary 72 70 - 99  mg/dL    Comment: Glucose reference range applies only to samples taken after fasting for at least 8 hours.  Glucose, capillary     Status: Abnormal   Collection Time: 06/02/23  4:24 AM  Result Value Ref Range   Glucose-Capillary 55 (L) 70 - 99 mg/dL    Comment: Glucose reference range applies only to samples taken after fasting for at least 8 hours.  Glucose, capillary     Status: Abnormal   Collection Time: 06/02/23  5:04 AM  Result Value Ref Range   Glucose-Capillary 114 (H) 70 - 99 mg/dL    Comment: Glucose reference range applies only to samples taken after fasting for at least 8 hours.  Basic metabolic panel     Status: Abnormal   Collection Time: 06/02/23  5:05 AM  Result Value Ref Range   Sodium 140 135 - 145 mmol/L   Potassium 3.0 (L) 3.5 - 5.1 mmol/L   Chloride 99 98 - 111 mmol/L   CO2 26 22 - 32 mmol/L   Glucose, Bld 141 (H) 70 - 99 mg/dL    Comment: Glucose reference range applies only to samples taken after fasting for at least 8 hours.   BUN 33 (H) 8 - 23 mg/dL   Creatinine, Ser 5.18 (H) 0.61 - 1.24 mg/dL   Calcium 9.3 8.9 - 84.1 mg/dL   GFR, Estimated 8 (L) >60 mL/min    Comment: (NOTE) Calculated using the CKD-EPI Creatinine Equation (2021)    Anion gap 15 5 - 15    Comment: Performed at [facility], [address] [place], [place] [address]  CBC     Status: Abnormal   Collection Time: 06/02/23  5:05 AM  Result Value Ref Range   WBC 5.7 4.0 - 10.5 K/uL   RBC 2.40 (L) 4.22 -  5.81 MIL/uL   Hemoglobin 7.3 (L) 13.0 - 17.0 g/dL   HCT 08.6 (L) 57.8 - 46.9 %   MCV 100.0 80.0 - 100.0 fL   MCH 30.4 26.0 - 34.0 pg   MCHC 30.4 30.0 - 36.0 g/dL   RDW 62.9 (H) 52.8 - 41.3 %   Platelets 249 150 - 400 K/uL   nRBC 0.0 0.0 - 0.2 %    Comment: Performed at [facility], [address], [place], [place] [address]  Glucose, capillary     Status: None   Collection Time: 06/02/23  7:31 AM  Result Value Ref Range   Glucose-Capillary 98 70 - 99 mg/dL    Comment: Glucose  reference range applies only to samples taken after fasting for at least 8 hours.   Comment 1 Notify RN    Comment 2 Document in Chart   Glucose, capillary     Status: Abnormal   Collection Time: 06/02/23 11:24 AM  Result Value Ref Range   Glucose-Capillary 61 (L) 70 - 99 mg/dL    Comment: Glucose reference range applies only to samples taken after fasting for at least 8 hours.   Comment 1 Notify RN    Comment 2 Document in Chart   Glucose, capillary     Status: Abnormal   Collection Time: 06/02/23 12:11 PM  Result Value Ref Range   Glucose-Capillary 122 (H) 70 - 99 mg/dL    Comment: Glucose reference range applies only to samples taken after fasting for at least 8 hours.   Comment 1 Notify RN    Comment 2 Document in Chart   Hemoglobin and hematocrit, blood     Status: Abnormal   Collection Time: 06/02/23  3:39 PM  Result Value Ref Range   Hemoglobin 7.3 (L) 13.0 - 17.0 g/dL   HCT 02.7 (L) 25.3 - 66.4 %    Comment: Performed at [facility], [address] [place], [place] [address]  Hepatitis B surface antigen     Status: None   Collection Time: 06/02/23  3:39 PM  Result Value Ref Range   Hepatitis B Surface Ag NON REACTIVE NON REACTIVE    Comment: Performed at [facility], [address] [place], [place] [address]  Glucose, capillary     Status: Abnormal   Collection Time: 06/02/23  4:28 PM  Result Value Ref Range   Glucose-Capillary 60 (L) 70 - 99 mg/dL    Comment: Glucose reference range applies only to samples taken after fasting for at least 8 hours.   Comment 1 Notify RN    Comment 2 Document in Chart   Glucose, capillary     Status: None   Collection Time: 06/02/23  9:09 PM  Result Value Ref Range   Glucose-Capillary 90 70 - 99 mg/dL    Comment: Glucose reference range applies only to samples taken after fasting for at least 8 hours.  Glucose, capillary     Status: Abnormal   Collection Time: 06/03/23 12:05 AM  Result Value Ref Range   Glucose-Capillary 107  (H) 70 - 99 mg/dL    Comment: Glucose reference range applies only to samples taken after fasting for at least 8 hours.  Glucose, capillary     Status: None   Collection Time: 06/03/23  3:47 AM  Result Value Ref Range   Glucose-Capillary 94 70 - 99 mg/dL    Comment: Glucose reference range applies only to samples taken after fasting for at least 8 hours.  CBC  Status: Abnormal   Collection Time: 06/03/23  4:47 AM  Result Value Ref Range   WBC 6.3 4.0 - 10.5 K/uL   RBC 2.41 (L) 4.22 - 5.81 MIL/uL   Hemoglobin 7.3 (L) 13.0 - 17.0 g/dL   HCT 57.8 (L) 46.9 - 62.9 %   MCV 99.2 80.0 - 100.0 fL   MCH 30.3 26.0 - 34.0 pg   MCHC 30.5 30.0 - 36.0 g/dL   RDW 52.8 (H) 41.3 - 24.4 %   Platelets 247 150 - 400 K/uL   nRBC 0.0 0.0 - 0.2 %    Comment: Performed at [facility], [address], [place], [place] [address]  Renal function panel     Status: Abnormal   Collection Time: 06/03/23  4:47 AM  Result Value Ref Range   Sodium 142 135 - 145 mmol/L   Potassium 3.2 (L) 3.5 - 5.1 mmol/L   Chloride 100 98 - 111 mmol/L   CO2 31 22 - 32 mmol/L   Glucose, Bld 92 70 - 99 mg/dL    Comment: Glucose reference range applies only to samples taken after fasting for at least 8 hours.   BUN 34 (H) 8 - 23 mg/dL   Creatinine, Ser 2.53 (H) 0.61 - 1.24 mg/dL   Calcium 9.6 8.9 - 66.4 mg/dL   Phosphorus 3.4 2.5 - 4.6 mg/dL   Albumin 2.0 (L) 3.5 - 5.0 g/dL   GFR, Estimated 7 (L) >60 mL/min    Comment: (NOTE) Calculated using the CKD-EPI Creatinine Equation (2021)    Anion gap 11 5 - 15    Comment: Performed at [facility], [address], [place], [place] [address]  Glucose, capillary     Status: None   Collection Time: 06/03/23  8:00 AM  Result Value Ref Range   Glucose-Capillary 73 70 - 99 mg/dL    Comment: Glucose reference range applies only to samples taken after fasting for at least 8 hours.    No results found.  ROS: all other systems reviewed and are negative except as per HPI Blood pressure  105/60, pulse (!) 57, temperature 97.6 F (36.4 C), temperature source Axillary, resp. rate 17, height 5\' 10"  (1.778 m), weight 67.1 kg, SpO2 91%. GEN  older gentleman, NAD HEENT EOMI PERRL NECK no JVD PULM clear CV irregular ABD soft, + PTC tube with bilious contents EXTno LE edema ACCESS L TDC  Assessment/Plan: 1 anemia: GI has been consulted, hold Eliquis, no urgent need for endoscopic eval.  Note that he is scheduled for EUS 12/30 to eval possible GIST with [facility].   2 ESRD: TTS [facility].  Will provide HD on schedule.  Will retime [place].  No heparin,   Midodrine ordered. 3 Hypotension/ Volume: increase midodrine to 10 TID 4. Anemia of ESRD: will order aranesp for today. 5.  Afib: on amiodarone, holding Eliquis 6.  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 Metabolic Bone Disease: binders when eating 8.  Dispo: admitted   Bufford Buttner 06/03/2023, 9:48 AM

## 2023-06-03 NOTE — TOC Progression Note (Signed)
Transition of Care [facility]) - Progression Note    Patient Details  Name: DAZHON NEWITT MRN: [id number] Date of Birth: Feb 20, 1946  [facility] CM/SW Contact  Elliot Gault, LCSW Phone Number: 06/03/2023, 3:24 PM  Clinical Narrative:     TOC following. PT eval complete. SNF auth started and currently pending. Anticipating likely dc tomorrow if auth received and after HD.  Updated MD. Jory Sims will follow.  Expected Discharge Plan: Skilled Nursing Facility Barriers to Discharge: Continued Medical Work up  Expected Discharge Plan and Services                                               Social Determinants of Health (SDOH) Interventions SDOH Screenings   Food Insecurity: No Food Insecurity (06/01/2023)  Recent Concern: Food Insecurity - Food Insecurity Present (04/13/2023)  Housing: Low Risk  (06/01/2023)  Transportation Needs: Unmet Transportation Needs (06/01/2023)  Utilities: Not At Risk (06/01/2023)  Social Connections: Unknown (10/13/2022)   Received from [facility]  Stress: No Stress Concern Present (10/14/2022)   Received from [facility]  Tobacco Use: Medium Risk (06/01/2023)    Readmission Risk Interventions    06/02/2023    2:16 PM 03/30/2023    2:29 PM 11/14/2022   10:08 AM  Readmission Risk Prevention Plan  Transportation Screening Complete Complete Complete  Medication Review [employer]) Complete Referral to Pharmacy Complete  PCP or Specialist appointment within 3-5 days of discharge Not Complete Complete Complete  HRI or Home Care Consult Complete Complete Complete  SW Recovery Care/Counseling Consult Complete Complete Complete  Palliative Care Screening Not Applicable Not Applicable Not Applicable  Skilled Nursing Facility Not Applicable Not Applicable Complete

## 2023-06-03 NOTE — Plan of Care (Signed)

## 2023-06-04 DIAGNOSIS — K922 Gastrointestinal hemorrhage, unspecified: Secondary | ICD-10-CM | POA: Diagnosis not present

## 2023-06-04 LAB — GLUCOSE, CAPILLARY
Glucose-Capillary: 110 mg/dL — ABNORMAL HIGH (ref 70–99)
Glucose-Capillary: 111 mg/dL — ABNORMAL HIGH (ref 70–99)
Glucose-Capillary: 64 mg/dL — ABNORMAL LOW (ref 70–99)
Glucose-Capillary: 67 mg/dL — ABNORMAL LOW (ref 70–99)
Glucose-Capillary: 71 mg/dL (ref 70–99)
Glucose-Capillary: 74 mg/dL (ref 70–99)
Glucose-Capillary: 80 mg/dL (ref 70–99)
Glucose-Capillary: 81 mg/dL (ref 70–99)
Glucose-Capillary: 87 mg/dL (ref 70–99)

## 2023-06-04 LAB — HEMOGLOBIN AND HEMATOCRIT, BLOOD
HCT: 21.7 % — ABNORMAL LOW (ref 39.0–52.0)
HCT: 29 % — ABNORMAL LOW (ref 39.0–52.0)
Hemoglobin: 6.5 g/dL — CL (ref 13.0–17.0)
Hemoglobin: 9 g/dL — ABNORMAL LOW (ref 13.0–17.0)

## 2023-06-04 MED ORDER — MIDODRINE HCL 5 MG PO TABS
15.0000 mg | ORAL_TABLET | Freq: Three times a day (TID) | ORAL | Status: DC
  Administered 2023-06-04 – 2023-06-05 (×4): 15 mg via ORAL
  Filled 2023-06-04 (×4): qty 3

## 2023-06-04 MED ORDER — SODIUM CHLORIDE 0.9% IV SOLUTION: Freq: Once | INTRAVENOUS | Status: AC

## 2023-06-04 NOTE — Progress Notes (Signed)
[facility] PROGRESS NOTE  WLLIAM PASINI (DOB: [birth year]) GNF:[id number] PCP: Charlynne Pander, MD  Brief Narrative: Justin [name] is a 77 y.o. 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 Hgb has remained stable at 7.3g/dl. GI recommends outpatient follow up as already arranged.   Subjective: Dislikes food, wants solids to eat. He denies any bleeding, abd pain, N/V/diarrhea. Staff deny any bleeding grossly during clean up of BM this morning. He does not feel lightheaded, remains bedbound.   Objective: BP (!) 89/43 (BP Location: Right Arm)   Pulse (!) 55   Temp (!) 97.5 F (36.4 C) (Oral)   Resp 14   Ht 5\' 10"  (1.778 m)   Wt 66.3 kg   SpO2 94%   BMI 20.97 kg/m   Gen: Elderly frail male in no distress Pulm: Clear, nonlabored  CV: Regular borderline bradycardia with stable systolic murmur, no JVD GI: Soft, NT, ND, +BS. Biliary drain with stable green clear output, no exudate.  Neuro: Alert and oriented. No new focal deficits. Ext: Warm, no deformities Skin: TDC site c/d/I nontender.    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 - Still no gross/active bleeding currently, GI recommends follow up with  GI as previously scheduled. 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 Advance diet today, pt reports he's been eating poorly due to dislike of current liquid diet.  - Hgb now meets transfusion threshold, 1u RBCs ordered 11/22.  - Holding anticoagulation (maybe  should stop this given hx bleeding gastric varices and indication is AFib).  - ESA w/HD per nephrology.   ESRD:  - Continue TTS schedule thru [facility], nephrology consultation appreciated.   Ischial osteomyelitis:  - Continue fortaz with HD per ID recommendations (x8 weeks). Remains afebrile.   Cholecystitis:  - Continue biliary drain management, defer definitive Tx to IR.   Hypotension: Chronic, remains low.  - Increase midodrine again today. He is hypoglycemic but has been eating poorly, will check cortisol in AM.   PAF: Bradycardic rate - Holding eliquis as above  Hypoglycemia:  - Monitoring, treat with enteral glucose/dextrose  Tyrone Nine, MD [facility] [email or web address] 06/04/2023, 12:26 PM

## 2023-06-04 NOTE — Progress Notes (Signed)
Hypoglycemic Event  CBG: 67  Treatment: 4 oz juice/soda  Symptoms: None  Follow-up CBG: Time:  0606 CBG Result:71  Possible Reasons for Event: Inadequate meal intake  Comments/MD notified:  Patient's blood sugar has been low multiple times today. I gave him an additional 4oz of soda after the 71 reading.  [name]

## 2023-06-04 NOTE — Progress Notes (Signed)
One unit prbc completed with no adverse reaction.  Order in for post transfusion H&H.  Has been somewhat confused off and on today but easily redirected. Eager to eat food and received meal tray for supper and ate about 25%.  Dressing changed to biliary tube and sutures are loose, no output today. Reinforced with tape and contacted Dr. Jarvis Newcomer. Only one small bm today and no bright blood.

## 2023-06-04 NOTE — Progress Notes (Signed)
[facility] Progress Note    Assessment/ Plan:   1 anemia: GI has seen patient, hold Eliquis, no urgent need for endoscopic eval.  Note that he is scheduled for EUS 12/30 to eval possible GIST with [facility].   2 ESRD: TTS [facility].  Will provide HD on TTS schedule, HD tomorrow.  Elita Quick w/ HD.  No heparin,  w/ Midodrine support. 3 Hypotension/ Volume: midodrine inc'd to 10 TID 4. Anemia of ESRD: s/p aranesp on 11/21. Avoiding IV Fe given infection. Transfuse prn for hgb <7. Volume status acceptable today, can receive 1u if needed 5.  Afib: on amiodarone, holding Eliquis 6.  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 Metabolic Bone Disease: binders when eating. Po4 wnl 8.  Dispo: admitted  Outpatient HD orders: [facility], TTS. 3h 450/autoflow 1.5, EDW: 82kg. 2/2.5 bath. TDC. Heparin: none   Subjective:   Patient seen and examined bedside. S/p HD yesterday--unable to tolerate UF given hypotension, net pos 700cc No complaints. He states that someone found blood in his stool, hgb 6.5 this am.   Objective:   BP (!) 103/52 (BP Location: Right Arm)   Pulse (!) 55   Temp 98 F (36.7 C) (Oral)   Resp 16   Ht 5\' 10"  (1.778 m)   Wt 66.3 kg   SpO2 96%   BMI 20.97 kg/m   Intake/Output Summary (Last 24 hours) at 06/04/2023 0857 Last data filed at 06/04/2023 0500 Gross per 24 hour  Intake 480 ml  Output 0 ml  Net 480 ml   Weight change:   Physical Exam: Gen: chronically ill appearing, NAD CVS: RRR, +systolic murmur Resp: CTA b/l Abd: ND Ext: no edema Neuro: awake, alert Dialysis access: Left TDC  Imaging: No results found.  Labs: BMET Recent Labs  Lab 06/01/23 1418 06/01/23 1747 06/02/23 0505 06/03/23 0447 06/03/23 1520  NA 140  --  140 142 140  K 3.3*  --  3.0* 3.2* 3.1*  CL 98  --  99 100 100  CO2 28  --  26 31 29   GLUCOSE 72  --  141* 92 102*  BUN 30*  --  33*  34* 36*  CREATININE 5.95* 6.21* 6.64* 7.67* 7.86*  CALCIUM 9.6  --  9.3 9.6 9.4  PHOS  --   --   --  3.4 3.6   CBC Recent Labs  Lab 06/01/23 1418 06/01/23 1747 06/02/23 0505 06/02/23 1539 06/03/23 0447 06/03/23 1520 06/04/23 0741  WBC 7.1 7.3 5.7  --  6.3 6.2  --   NEUTROABS 5.5  --   --   --   --   --   --   HGB 8.4* 7.1* 7.3* 7.3* 7.3* 7.2* 6.5*  HCT 29.2* 24.9* 24.0* 25.4* 23.9* 23.5* 21.7*  MCV 102.1* 101.2* 100.0  --  99.2 99.2  --   PLT 293 291 249  --  247 289  --     Medications:     sodium chloride   Intravenous Once   calcitRIOL  0.25 mcg Oral Daily   Chlorhexidine Gluconate Cloth  6 each Topical Daily   Chlorhexidine Gluconate Cloth  6 each Topical Q0600   heparin  5,000 Units Subcutaneous Q8H   midodrine  10 mg Oral TID WC   polyethylene glycol  17 g Oral Daily   pregabalin  75 mg Oral QHS      Anthony Sar, MD  [facility] 06/04/2023, 8:57 AM

## 2023-06-04 NOTE — TOC Progression Note (Signed)
Transition of Care [facility]) - Progression Note    Patient Details  Name: Justin [name] MRN: [id number] Date of Birth: [birth year]  Transition of Care [facility]) CM/SW Contact  Leitha Bleak, RN Phone Number: 06/04/2023, 11:40 AM  Clinical Narrative:   Insurance auth received, Patient is from [place]. Patient is not medically ready to day. TOC updated Revonda Standard, she is agreeable with weekend DC. TOC following.     Expected Discharge Plan: Skilled Nursing Facility Barriers to Discharge: Continued Medical Work up  Expected Discharge Plan and Services        Social Determinants of Health (SDOH) Interventions SDOH Screenings   Food Insecurity: No Food Insecurity (06/01/2023)  Recent Concern: Food Insecurity - Food Insecurity Present (04/13/2023)  Housing: Low Risk  (06/01/2023)  Transportation Needs: Unmet Transportation Needs (06/01/2023)  Utilities: Not At Risk (06/01/2023)  Social Connections: Unknown (10/13/2022)   Received from [facility], [facility]  Stress: No Stress Concern Present (10/14/2022)   Received from [facility], [facility]  Tobacco Use: Medium Risk (06/01/2023)    Readmission Risk Interventions    06/02/2023    2:16 PM 03/30/2023    2:29 PM 11/14/2022   10:08 AM  Readmission Risk Prevention Plan  Transportation Screening Complete Complete Complete  Medication Review [facility]) Complete Referral to Pharmacy Complete  PCP or Specialist appointment within 3-5 days of discharge Not Complete Complete Complete  HRI or Home Care Consult Complete Complete Complete  SW Recovery Care/Counseling Consult Complete Complete Complete  Palliative Care Screening Not Applicable Not Applicable Not Applicable  Skilled Nursing Facility Not Applicable Not Applicable Complete

## 2023-06-04 NOTE — Plan of Care (Signed)

## 2023-06-05 DIAGNOSIS — K922 Gastrointestinal hemorrhage, unspecified: Secondary | ICD-10-CM | POA: Diagnosis not present

## 2023-06-05 LAB — CBC
HCT: 24.8 % — ABNORMAL LOW (ref 39.0–52.0)
Hemoglobin: 7.8 g/dL — ABNORMAL LOW (ref 13.0–17.0)
MCH: 29.8 pg (ref 26.0–34.0)
MCHC: 31.5 g/dL (ref 30.0–36.0)
MCV: 94.7 fL (ref 80.0–100.0)
Platelets: 216 10*3/uL (ref 150–400)
RBC: 2.62 MIL/uL — ABNORMAL LOW (ref 4.22–5.81)
RDW: 21.4 % — ABNORMAL HIGH (ref 11.5–15.5)
WBC: 6.4 10*3/uL (ref 4.0–10.5)
nRBC: 0 % (ref 0.0–0.2)

## 2023-06-05 LAB — RENAL FUNCTION PANEL
Albumin: 2.2 g/dL — ABNORMAL LOW (ref 3.5–5.0)
Anion gap: 8 (ref 5–15)
BUN: 20 mg/dL (ref 8–23)
CO2: 30 mmol/L (ref 22–32)
Calcium: 9.1 mg/dL (ref 8.9–10.3)
Chloride: 97 mmol/L — ABNORMAL LOW (ref 98–111)
Creatinine, Ser: 5.4 mg/dL — ABNORMAL HIGH (ref 0.61–1.24)
GFR, Estimated: 10 mL/min — ABNORMAL LOW (ref >60)
Glucose, Bld: 88 mg/dL (ref 70–99)
Phosphorus: 2.9 mg/dL (ref 2.5–4.6)
Potassium: 3.4 mmol/L — ABNORMAL LOW (ref 3.5–5.1)
Sodium: 135 mmol/L (ref 135–145)

## 2023-06-05 LAB — TYPE AND SCREEN
ABO/RH(D): O POS
Antibody Screen: NEGATIVE
Unit division: 0
Unit division: 0

## 2023-06-05 LAB — GLUCOSE, CAPILLARY
Glucose-Capillary: 101 mg/dL — ABNORMAL HIGH (ref 70–99)
Glucose-Capillary: 61 mg/dL — ABNORMAL LOW (ref 70–99)
Glucose-Capillary: 66 mg/dL — ABNORMAL LOW (ref 70–99)
Glucose-Capillary: 68 mg/dL — ABNORMAL LOW (ref 70–99)
Glucose-Capillary: 77 mg/dL (ref 70–99)
Glucose-Capillary: 78 mg/dL (ref 70–99)

## 2023-06-05 LAB — BPAM RBC
Blood Product Expiration Date: 202412062359
Blood Product Expiration Date: 202412072359
ISSUE DATE / TIME: 202411221348
Unit Type and Rh: 5100
Unit Type and Rh: 5100

## 2023-06-05 MED ORDER — ALBUMIN HUMAN 25 % IV SOLN
25.0000 g | Freq: Once | INTRAVENOUS | Status: AC
  Administered 2023-06-05: 25 g via INTRAVENOUS

## 2023-06-05 MED ORDER — MIDODRINE HCL 5 MG PO TABS: 10.0000 mg | ORAL_TABLET | Freq: Three times a day (TID) | ORAL | Status: DC

## 2023-06-05 MED ORDER — PREGABALIN 75 MG PO CAPS: 75.0000 mg | ORAL_CAPSULE | Freq: Two times a day (BID) | ORAL | 0 refills | Status: DC

## 2023-06-05 MED ORDER — DEXTROSE 50 % IV SOLN
12.5000 g | INTRAVENOUS | Status: AC
  Administered 2023-06-05: 12.5 g via INTRAVENOUS

## 2023-06-05 MED ORDER — PERCOCET 5-325 MG PO TABS: 1.0000 | ORAL_TABLET | Freq: Three times a day (TID) | ORAL | 0 refills | Status: DC | PRN

## 2023-06-05 NOTE — TOC Transition Note (Signed)
Transition of Care [facility]) - CM/SW Discharge Note   Patient Details  Name: Justin [name] MRN: [id number] Date of Birth: 02-Jul-1946  Transition of Care [facility]) CM/SW Contact:  Catalina Gravel, LCSW Phone Number: 06/05/2023, 3:56 PM   Clinical Narrative:    Pt ready for discharge, [facility] available to accept pt.  CSW contacted daughter advising pt will DC today and return to facility via arranged transportation.  CSW communicated with Revonda Standard verified [facility] Harvey received and pt can return. RR information provided to nursing team via [facility] and medical necc. Completed, pt placed on transport list.  No other TOC needs at this time.      Barriers to Discharge: Continued Medical Work up   Patient Goals and CMS Choice CMS Medicare.gov Compare Post Acute Care list provided to:: Patient Choice offered to / list presented to : Patient  Discharge Placement                         Discharge Plan and Services Additional resources added to the After Visit Summary for                                       Social Determinants of Health (SDOH) Interventions SDOH Screenings   Food Insecurity: No Food Insecurity (06/01/2023)  Recent Concern: Food Insecurity - Food Insecurity Present (04/13/2023)  Housing: Low Risk  (06/01/2023)  Transportation Needs: Unmet Transportation Needs (06/01/2023)  Utilities: Not At Risk (06/01/2023)  Social Connections: Unknown (10/13/2022)   Received from [facility], [facility]  Stress: No Stress Concern Present (10/14/2022)   Received from [facility], [facility]  Tobacco Use: Medium Risk (06/01/2023)     Readmission Risk Interventions    06/02/2023    2:16 PM 03/30/2023    2:29 PM 11/14/2022   10:08 AM  Readmission Risk Prevention Plan  Transportation Screening Complete Complete Complete  Medication Review Oceanographer) Complete Referral to Pharmacy Complete  PCP or Specialist appointment within 3-5 days of discharge Not Complete  Complete Complete  HRI or Home Care Consult Complete Complete Complete  SW Recovery Care/Counseling Consult Complete Complete Complete  Palliative Care Screening Not Applicable Not Applicable Not Applicable  Skilled Nursing Facility Not Applicable Not Applicable Complete

## 2023-06-05 NOTE — Progress Notes (Addendum)
Pt goal met. Fortaz 1 g IV . Albumin 25 g IV given during HD.  06/05/23 1450  Vitals  Temp 98.6 F (37 C)  Temp Source Oral  BP 120/66  BP Location Right Arm  BP Method Automatic  Patient Position (if appropriate) Lying  Pulse Rate 60  Resp 14  Oxygen Therapy  SpO2 100 %  O2 Device Room Air  During Treatment Monitoring  Intra-Hemodialysis Comments Progressing as prescribed  Post Treatment  Dialyzer Clearance Lightly streaked  Hemodialysis Intake (mL) 0 mL  Liters Processed 72  Fluid Removed (mL) 1000 mL  Tolerated HD Treatment Yes  Post-Hemodialysis Comments Pt goal met.  Hemodialysis Catheter Left Subclavian  No placement date or time found.   Placed prior to admission: Yes  Orientation: Left  Access Location: Subclavian  Site Condition No complications  Blue Lumen Status Heparin locked  Red Lumen Status Heparin locked  Catheter fill solution Heparin 1000 units/ml  Catheter fill volume (Arterial) 2 cc  Catheter fill volume (Venous) 2  Dressing Type Transparent  Dressing Status Antimicrobial disc in place  Interventions New dressing  Drainage Description None  Dressing Change Due 06/10/23  Post treatment catheter status Capped and Clamped

## 2023-06-05 NOTE — Progress Notes (Signed)
Patient discharged to [facility], transported by EMS to facility. Belongings sent with patient, discharge paperwork placed in discharge packet to give to facility. Report called to [employer]. CBG checked prior to patients discharge was 77.

## 2023-06-05 NOTE — Plan of Care (Signed)
Pt continues to be non-compliant with staff for v/s and glucose checks. Pt momentarily allowed for BS to be checked earlier. BS was 64, pt drank orange juice. Rechecked BS, BS: 66. Pt refused to drink more juice but tolerated some Glutose15 oral glucose. Again, pt refused a follow up BS reading. Pt is combative, threatening to hit and kick staff. Charge nurse was made aware, informed pt staff abuse will not be tolerated. Will notify on call [facility] and continue to monitor pt progress for duration of nursing shift.

## 2023-06-05 NOTE — Progress Notes (Signed)
Patient CBG reading was 61 mg/dL, patient refused to drink anything by mouth. Patient did not report any s/s of hypoglycemia. MD Jarvis Newcomer made aware. New order placed to give Dextrose 50% 12.5 grams. Given to patient at 3. Rechecked CBG was 101 mg/dL. MD Jarvis Newcomer made aware.

## 2023-06-05 NOTE — Discharge Summary (Signed)
Physician Discharge Summary   Patient: Justin [name] MRN: [id number] DOB: 05-Mar-1946  Admit date:     06/01/2023  Discharge date: 06/05/23  Discharge Physician: Tyrone Nine   PCP: Charlynne Pander, MD   Recommendations at discharge:  Repeat renal function panel and CBC early next week. Consider AM cortisol (pt declined this work up here). Follow up with GI as outpatient as scheduled or sooner if anemia is recurrent.  Further details outlined below.  Discharge Diagnoses: Principal Problem:   GI bleed Active Problems:   Hypoglycemia   ESRD (end stage renal disease) (HCC)   Coagulopathy (HCC)   Chronic hypotension   Occult blood positive stool  Hospital Course: Justin [name] is a 77 y.o. 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 Hgb trended down slowly to 6.5g/dl in the absence of gross bleeding. 1u RBCs improved blood count as anticipated to 7.8g/dl and vital signs are stable. GI deferred inpatient work up, recommending outpatient follow up as already arranged.   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 - Still no gross/active bleeding currently, GI recommends follow up with [facility] as previously scheduled. 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 Advance diet today, pt reports he's been eating poorly due to dislike of current liquid diet.  - Hgb now meets transfusion threshold, 1u RBCs ordered 11/22.  - Holding anticoagulation (maybe should stop this given hx  bleeding gastric varices and indication is AFib).  - ESA w/HD per nephrology.    ESRD:  - Continue TTS schedule thru [facility], Received routine HD during stay here including 11/23. Next will be 11/26.   Ischial osteomyelitis:  - Continue fortaz with HD per ID recommendations (x8 weeks). Remains afebrile.    Cholecystitis:  - Continue biliary drain management, defer definitive Tx to IR.    Hypotension: Chronic, remains low.  - Continue midodrine if needed. - Pt declined AM cortisol testing, consider this after discharge.   PAF: Bradycardic rate - Continue amiodarone, does not appear to be on digoxin.  - Given symptomatic anemia and suspected GI cause, pt is on board with plan to hold anticoagulation for the time being.    Hypoglycemia:  - Improved when patient eats regularly.   Consultants: Nephrology, GI Procedures performed: Hemodialysis  Disposition: Skilled nursing facility Diet recommendation:  Renal diet DISCHARGE MEDICATION: Allergies as of 06/05/2023   No Known Allergies      Medication List     STOP taking these medications    Digoxin 62.5 MCG Tabs   Eliquis 2.5 MG Tabs tablet Generic drug: apixaban       TAKE these medications    acetaminophen 325 MG tablet Commonly known as: TYLENOL Take 2 tablets (650 mg total) by mouth every 6 (six) hours as needed for mild pain (or Fever >/= 101).   amiodarone 200 MG tablet Commonly known as: PACERONE Take 1 tablet (200 mg total) by mouth daily.   atorvastatin 40 MG tablet Commonly known as: LIPITOR Take  40 mg by mouth at bedtime.   calcitRIOL 0.25 MCG capsule Commonly known as: ROCALTROL Take 3 capsules (0.75 mcg total) by mouth every Monday, Wednesday, and Friday with hemodialysis. What changed:  how much to take when to take this   cefTAZidime 1 g in sodium chloride 0.9 % 100 mL Inject 1 g into the vein every Monday, Wednesday, and Friday with hemodialysis.   midodrine 5 MG tablet Commonly known as:  PROAMATINE Take 2 tablets (10 mg total) by mouth 3 (three) times daily with meals.   pantoprazole 40 MG tablet Commonly known as: Protonix Take 1 tablet (40 mg total) by mouth 2 (two) times daily before a meal. What changed: when to take this   Percocet 5-325 MG tablet Generic drug: oxyCODONE-acetaminophen Take 1 tablet by mouth every 8 (eight) hours as needed. What changed: when to take this   pregabalin 75 MG capsule Commonly known as: LYRICA Take 1 capsule (75 mg total) by mouth 2 (two) times daily.        Contact information for follow-up providers     Charlynne Pander, MD Follow up.   Specialty: Internal Medicine Contact information: [address] [place] [address] [phone]              Contact information for after-discharge care     Destination     [facility] .   Service: Skilled Nursing Contact information: [address] [place] [address] [phone]                    Discharge Exam: Ceasar Mons Weights   06/03/23 1910 06/05/23 1117 06/05/23 1457  Weight: 66.3 kg 67 kg 66 kg  BP 120/66 (BP Location: Right Arm)   Pulse 60   Temp 98.6 F (37 C) (Oral)   Resp 14   Ht 5\' 10"  (1.778 m)   Wt 66 kg   SpO2 100%   BMI 20.88 kg/m   No distress, chronically ill-appearing, thin male Clear, nonlabored RRR, stable systolic murmur, no edema Abd soft, NT, ND, +BS No evidence of bleeding TDC in left chest c/d/I, nontender  Condition at discharge: stable  The results of significant diagnostics from this hospitalization (including imaging, microbiology, ancillary and laboratory) are listed below for reference.   Imaging Studies: No results found.  Microbiology: Results for orders placed or performed during the hospital encounter of 06/01/23  Culture, blood (routine x 2)     Status: None (Preliminary result)   Collection Time: 06/01/23  2:18 PM   Specimen: BLOOD  Result Value Ref Range Status    Specimen Description BLOOD BLOOD RIGHT ARM  Final   Special Requests   Final    Blood Culture adequate volume BOTTLES DRAWN AEROBIC AND ANAEROBIC   Culture   Final    NO GROWTH 4 DAYS Performed at [facility], [address] [place], [place] [address]    Report Status PENDING  Incomplete  Culture, blood (routine x 2)     Status: None (Preliminary result)   Collection Time: 06/01/23  2:18 PM   Specimen: BLOOD  Result Value Ref Range Status   Specimen Description BLOOD BLOOD RIGHT HAND  Final   Special Requests   Final    Blood Culture results may not be optimal due to an excessive volume of blood received in culture bottles BOTTLES DRAWN AEROBIC AND ANAEROBIC   Culture   Final    NO GROWTH 4 DAYS Performed at [facility],  [address], [place], [place] [address]    Report Status PENDING  Incomplete    Labs: CBC: Recent Labs  Lab 06/01/23 1418 06/01/23 1747 06/02/23 0505 06/02/23 1539 06/03/23 0447 06/03/23 1520 06/04/23 0741 06/04/23 1925 06/05/23 1018  WBC 7.1 7.3 5.7  --  6.3 6.2  --   --  6.4  NEUTROABS 5.5  --   --   --   --   --   --   --   --   HGB 8.4* 7.1* 7.3*   < > 7.3* 7.2* 6.5* 9.0* 7.8*  HCT 29.2* 24.9* 24.0*   < > 23.9* 23.5* 21.7* 29.0* 24.8*  MCV 102.1* 101.2* 100.0  --  99.2 99.2  --   --  94.7  PLT 293 291 249  --  247 289  --   --  216   < > = values in this interval not displayed.   Basic Metabolic Panel: Recent Labs  Lab 06/01/23 1418 06/01/23 1747 06/02/23 0505 06/03/23 0447 06/03/23 1520 06/05/23 1018  NA 140  --  140 142 140 135  K 3.3*  --  3.0* 3.2* 3.1* 3.4*  CL 98  --  99 100 100 97*  CO2 28  --  26 31 29 30   GLUCOSE 72  --  141* 92 102* 88  BUN 30*  --  33* 34* 36* 20  CREATININE 5.95* 6.21* 6.64* 7.67* 7.86* 5.40*  CALCIUM 9.6  --  9.3 9.6 9.4 9.1  PHOS  --   --   --  3.4 3.6 2.9   Liver Function Tests: Recent Labs  Lab 06/01/23 1418 06/03/23 0447 06/03/23 1520 06/05/23 1018  AST 16  --   --   --   ALT 11  --    --   --   ALKPHOS 59  --   --   --   BILITOT 0.9  --   --   --   PROT 8.3*  --   --   --   ALBUMIN 2.4* 2.0* 2.1* 2.2*   CBG: Recent Labs  Lab 06/05/23 0015 06/05/23 0358 06/05/23 0738 06/05/23 0900 06/05/23 1138  GLUCAP 66* 68* 61* 101* 78    Discharge time spent: greater than 30 minutes.  Signed: Tyrone Nine, MD [facility] 06/05/2023

## 2023-06-06 LAB — CULTURE, BLOOD (ROUTINE X 2)
Culture: NO GROWTH
Culture: NO GROWTH
Special Requests: ADEQUATE

## 2023-06-14 ENCOUNTER — Encounter: Payer: Self-pay | Admitting: Internal Medicine

## 2023-06-14 ENCOUNTER — Ambulatory Visit: Payer: Medicare Other | Admitting: Internal Medicine

## 2023-06-14 ENCOUNTER — Other Ambulatory Visit: Payer: Self-pay

## 2023-06-14 VITALS — BP 92/69 | HR 79 | Temp 97.3°F

## 2023-06-14 DIAGNOSIS — M8618 Other acute osteomyelitis, other site: Secondary | ICD-10-CM | POA: Diagnosis not present

## 2023-06-14 DIAGNOSIS — E43 Unspecified severe protein-calorie malnutrition: Secondary | ICD-10-CM

## 2023-06-14 DIAGNOSIS — N186 End stage renal disease: Secondary | ICD-10-CM | POA: Diagnosis not present

## 2023-06-14 DIAGNOSIS — R234 Changes in skin texture: Secondary | ICD-10-CM

## 2023-06-14 DIAGNOSIS — Z992 Dependence on renal dialysis: Secondary | ICD-10-CM

## 2023-06-14 DIAGNOSIS — M869 Osteomyelitis, unspecified: Secondary | ICD-10-CM

## 2023-06-14 NOTE — Progress Notes (Unsigned)
RFV: follow up for ischial osteo  Patient ID: Justin [name], male   DOB: 03/13/1946, 77 y.o.   MRN: [id number]  HPI The patient, with a history of ischial osteomyelitis, was recently hospitalized due to low blood pressure and a significantly low blood count, necessitating a blood transfusion. Post-transfusion, the patient reported no noticeable improvement in their condition. The patient was unsure about upcoming diagnostic procedures, such as a colonoscopy and EGD, which may be planned to identify potential sources of bleeding.  The patient completed an eight-week course of IV antibiotics for ischial osteomyelitis. They also have a cholecystectomy drain in place, the management of which remains uncertain. The patient is on a dialysis schedule of Tuesday, Thursday, and Saturday, which they tolerate well.  The patient reported gastrointestinal distress, including diarrhea, during the antibiotic treatment. They expressed relief at the completion of the antibiotic course and hoped for a return to normal dietary habits without gastrointestinal issues.  The patient also reported sores on both heels. They have been using a pillow under their legs while sleeping as a preventive measure. The patient is up-to-date with their vaccinations, including the flu and COVID vaccines.  Wound care comes to see him weekly for bilateral heel eschar Outpatient Encounter Medications as of 06/14/2023  Medication Sig   acetaminophen (TYLENOL) 325 MG tablet Take 2 tablets (650 mg total) by mouth every 6 (six) hours as needed for mild pain (or Fever >/= 101).   amiodarone (PACERONE) 200 MG tablet Take 1 tablet (200 mg total) by mouth daily.   atorvastatin (LIPITOR) 40 MG tablet Take 40 mg by mouth at bedtime.   calcitRIOL (ROCALTROL) 0.25 MCG capsule Take 3 capsules (0.75 mcg total) by mouth every Monday, Wednesday, and Friday with hemodialysis. (Patient taking differently: Take 0.25 mcg by mouth daily.)   cefTAZidime 1  g in sodium chloride 0.9 % 100 mL Inject 1 g into the vein every Monday, Wednesday, and Friday with hemodialysis.   midodrine (PROAMATINE) 5 MG tablet Take 2 tablets (10 mg total) by mouth 3 (three) times daily with meals.   pantoprazole (PROTONIX) 40 MG tablet Take 1 tablet (40 mg total) by mouth 2 (two) times daily before a meal. (Patient taking differently: Take 40 mg by mouth daily.)   PERCOCET 5-325 MG tablet Take 1 tablet by mouth every 8 (eight) hours as needed.   pregabalin (LYRICA) 75 MG capsule Take 1 capsule (75 mg total) by mouth 2 (two) times daily.   No facility-administered encounter medications on file as of 06/14/2023.     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   Hemorrhagic shock (HCC) 12/14/2022   Acute encephalopathy 11/09/2022   Syncope 11/09/2022   Acute blood loss anemia 11/09/2022   Melena 11/09/2022   History of colonic polyps 11/09/2022   End stage renal disease (HCC) 11/09/2022   Gastric nodule 11/09/2022   Rectal bleeding 09/17/2022   Colon ulcer 09/17/2022   Lower GI bleeding 09/17/2022   GI bleed 09/16/2022  Prolonged QT interval 09/16/2022   History of prostate cancer 09/16/2022   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   History of colon polyps 09/16/2022   Lower GI bleed 09/12/2022   Acute GI bleeding 09/10/2022   Wide-complex tachycardia 06/17/2022   Hypotension 06/13/2022   Obesity (BMI 30-39.9) 06/13/2022   Perirectal abscess 06/13/2022    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   Murmur, cardiac 01/27/2022   Chronic back pain 01/27/2022   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/27/2022   GERD (gastroesophageal reflux disease) 11/11/2021   High risk medication use 11/11/2021   History of cardioversion 11/11/2021   Paroxysmal atrial fibrillation with RVR (HCC) 11/11/2021   PVC (premature ventricular contraction) 11/11/2021   Nonrheumatic aortic valve stenosis 11/06/2021   Mixed hyperlipidemia 11/06/2021   Abdominal mass 05/05/2021   First degree AV block 07/13/2020   Paroxysmal A-fib (HCC) 01/29/2020   Wound infection 01/28/2020   H/O vitrectomy 12/07/2019   Macular pucker, 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 determined by examination associated with type 2 diabetes mellitus (HCC) 10/24/2019   Stable treated proliferative diabetic retinopathy of left eye determined by examination associated with type 2 diabetes mellitus (HCC) 10/24/2019   ESRD on hemodialysis (HCC) 07/29/2017   Anticoagulated 07/13/2017   Malignant neoplasm of prostate (HCC) 07/12/2017   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   Coagulation defect, unspecified (HCC) 01/08/2017   Idiopathic gout 01/08/2017   Acute on chronic blood loss anemia 11/20/2015   Blurred vision, left eye 07/10/2011   Essential hypertension 07/10/2011   DM (diabetes mellitus) (HCC) 07/10/2011     Health Maintenance Due  Topic Date Due   Medicare Annual Wellness (AWV)  Never done   Pneumonia Vaccine 12+ Years old (1 of 2 - PCV) Never done   FOOT EXAM  Never done   OPHTHALMOLOGY EXAM  Never done   Hepatitis C Screening  Never done   DTaP/Tdap/Td (1 - Tdap) Never done   Zoster Vaccines- Shingrix (1 of 2) Never done   INFLUENZA VACCINE  02/11/2023   COVID-19 Vaccine (4 - 2023-24 season) 03/14/2023     Review of Systems 12 point ros is otherwise negative, denies dark stools or  BRBPR Physical Exam  BP 92/69   Pulse 79   Temp (!) 97.3 F (36.3 C) (Oral)  Physical Exam  Constitutional: He is oriented to person, place, and time. He appears well-developed and well-nourished. No distress.  HENT:  Mouth/Throat: Oropharynx is clear and moist. No oropharyngeal exudate.  Cardiovascular: Normal rate, regular rhythm and normal heart sounds. Exam reveals no gallop and no friction rub.  No murmur heard.  Pulmonary/Chest: Effort normal and breath sounds normal. No respiratory distress. He has no wheezes.  Abdominal: Soft. Bowel sounds are normal. He exhibits no distension. There is no tenderness. Chole drain in place Lymphadenopathy:  He has no cervical adenopathy.  Neurological: He is alert and oriented to person, place, and time.  Skin: Skin is warm and dry. No rash noted. No erythema. Bilateral heel dry eschar Psychiatric: He has a normal mood and affect. His behavior is normal.    CBC Lab Results  Component Value Date   WBC 6.4 06/05/2023   RBC 2.62 (L) 06/05/2023   HGB 7.8 (L) 06/05/2023   HCT 24.8 (L) 06/05/2023   PLT 216 06/05/2023   MCV 94.7 06/05/2023   MCH 29.8 06/05/2023   MCHC 31.5 06/05/2023   RDW 21.4 (H) 06/05/2023  LYMPHSABS 1.0 06/01/2023   MONOABS 0.5 06/01/2023   EOSABS 0.1 06/01/2023    BMET Lab Results  Component Value Date   NA 135 06/05/2023   K 3.4 (L) 06/05/2023   CL 97 (L) 06/05/2023   CO2 30 06/05/2023   GLUCOSE 88 06/05/2023   BUN 20 06/05/2023   CREATININE 5.40 (H) 06/05/2023   CALCIUM 9.1 06/05/2023   GFRNONAA 10 (L) 06/05/2023   GFRAA 8 (L) 02/01/2020      Assessment and Plan  Anemia Hospitalized due to hypotension and significantly low hemoglobin, necessitating a blood transfusion. Etiology under investigation, with potential sources of bleeding considered. No symptom improvement post-transfusion. Colonoscopy and EGD may be scheduled to identify the source of bleeding. - Confirm scheduling of colonoscopy and EGD  to identify the source of bleeding  Ischial Osteomyelitis Completed an eight-week course of IV antibiotics during dialysis sessions. Experienced gastrointestinal side effects, including diarrhea. Treatment course finished. Will check inflammatory markers to ensure resolution of infection. - Check inflammatory markers to ensure resolution of infection - plan to monitor off of abtx  Cholecystectomy Drain Management Cholecystectomy drain in place. Uncertainty regarding management plan. Follow-up needed to determine next steps. - Follow up with the team managing the cholecystectomy drain to determine next steps  End-Stage Renal Disease (ESRD) on Dialysis On a dialysis schedule of Tuesday, Thursday, and Saturday. Tolerating sessions well.  Bilatera Heel Ulcers Sores on both heels. Using a pillow under legs while sleeping to alleviate pressure. - Examine the sores on both heels, both are dry eschar - recommend to continue with weekly follow up with wound care  Loss of appetite/unintentional weight loss Increase supplemental shake  Will do trial of mirtazipine 7.5mg  to see if that improves his appetite  Follow-up - Confirm upcoming appointments with the GI doctor - Follow up on the management plan for the cholecystectomy drain.

## 2023-06-15 ENCOUNTER — Telehealth: Payer: Self-pay | Admitting: Internal Medicine

## 2023-06-15 LAB — SEDIMENTATION RATE: Sed Rate: 31 mm/h — ABNORMAL HIGH (ref 0–20)

## 2023-06-15 LAB — C-REACTIVE PROTEIN: CRP: 45.8 mg/L — ABNORMAL HIGH (ref <8.0)

## 2023-06-15 NOTE — Telephone Encounter (Signed)
Justin [name] Facility rep Almeta Monas called to ask if the patient would need a surgeon for the cholecystostomy drain since the patient is scheduled for a hospital procedure [phone].

## 2023-06-16 NOTE — Telephone Encounter (Signed)
Returned the call to [employer]. Call was transferred to voicemail. Message left of my call. Left her my direct phone number.

## 2023-06-16 NOTE — Telephone Encounter (Signed)
Appears to be Leonides Schanz pt

## 2023-06-16 NOTE — Telephone Encounter (Signed)
Drain changes have been done through [facility] interventional radiology department. The last exchange I see was 05/06/23 with Dr Miles Costain at [facility]. Call placed to the IR dept. Message left asking for a return call. Are there any special considerations for his drain during the colonoscopy with emr on 07/12/23 with Dr Meridee Score.

## 2023-06-19 ENCOUNTER — Emergency Department: Payer: Medicare Other

## 2023-06-19 ENCOUNTER — Other Ambulatory Visit: Payer: Self-pay

## 2023-06-19 ENCOUNTER — Inpatient Hospital Stay
Admission: EM | Admit: 2023-06-19 | Discharge: 2023-07-14 | DRG: 871 | Disposition: E | Payer: Medicare Other | Attending: Student | Admitting: Student

## 2023-06-19 DIAGNOSIS — E119 Type 2 diabetes mellitus without complications: Secondary | ICD-10-CM

## 2023-06-19 DIAGNOSIS — L89152 Pressure ulcer of sacral region, stage 2: Secondary | ICD-10-CM | POA: Diagnosis present

## 2023-06-19 DIAGNOSIS — E11649 Type 2 diabetes mellitus with hypoglycemia without coma: Secondary | ICD-10-CM | POA: Diagnosis not present

## 2023-06-19 DIAGNOSIS — N2581 Secondary hyperparathyroidism of renal origin: Secondary | ICD-10-CM | POA: Diagnosis present

## 2023-06-19 DIAGNOSIS — Z993 Dependence on wheelchair: Secondary | ICD-10-CM

## 2023-06-19 DIAGNOSIS — Z811 Family history of alcohol abuse and dependence: Secondary | ICD-10-CM

## 2023-06-19 DIAGNOSIS — Z923 Personal history of irradiation: Secondary | ICD-10-CM

## 2023-06-19 DIAGNOSIS — I082 Rheumatic disorders of both aortic and tricuspid valves: Secondary | ICD-10-CM | POA: Diagnosis present

## 2023-06-19 DIAGNOSIS — L8990 Pressure ulcer of unspecified site, unspecified stage: Secondary | ICD-10-CM | POA: Insufficient documentation

## 2023-06-19 DIAGNOSIS — Y95 Nosocomial condition: Secondary | ICD-10-CM | POA: Diagnosis present

## 2023-06-19 DIAGNOSIS — M109 Gout, unspecified: Secondary | ICD-10-CM | POA: Diagnosis present

## 2023-06-19 DIAGNOSIS — R6521 Severe sepsis with septic shock: Secondary | ICD-10-CM | POA: Diagnosis present

## 2023-06-19 DIAGNOSIS — L89620 Pressure ulcer of left heel, unstageable: Secondary | ICD-10-CM | POA: Diagnosis present

## 2023-06-19 DIAGNOSIS — R0902 Hypoxemia: Secondary | ICD-10-CM | POA: Diagnosis present

## 2023-06-19 DIAGNOSIS — Z8719 Personal history of other diseases of the digestive system: Secondary | ICD-10-CM | POA: Diagnosis not present

## 2023-06-19 DIAGNOSIS — E43 Unspecified severe protein-calorie malnutrition: Secondary | ICD-10-CM

## 2023-06-19 DIAGNOSIS — Z860100 Personal history of colon polyps, unspecified: Secondary | ICD-10-CM

## 2023-06-19 DIAGNOSIS — Z841 Family history of disorders of kidney and ureter: Secondary | ICD-10-CM

## 2023-06-19 DIAGNOSIS — Z515 Encounter for palliative care: Secondary | ICD-10-CM

## 2023-06-19 DIAGNOSIS — J189 Pneumonia, unspecified organism: Secondary | ICD-10-CM

## 2023-06-19 DIAGNOSIS — Z66 Do not resuscitate: Secondary | ICD-10-CM | POA: Diagnosis not present

## 2023-06-19 DIAGNOSIS — Z7401 Bed confinement status: Secondary | ICD-10-CM

## 2023-06-19 DIAGNOSIS — N186 End stage renal disease: Secondary | ICD-10-CM | POA: Diagnosis present

## 2023-06-19 DIAGNOSIS — Z8546 Personal history of malignant neoplasm of prostate: Secondary | ICD-10-CM

## 2023-06-19 DIAGNOSIS — Z7189 Other specified counseling: Secondary | ICD-10-CM | POA: Diagnosis not present

## 2023-06-19 DIAGNOSIS — I9589 Other hypotension: Secondary | ICD-10-CM | POA: Diagnosis not present

## 2023-06-19 DIAGNOSIS — R6520 Severe sepsis without septic shock: Secondary | ICD-10-CM | POA: Diagnosis present

## 2023-06-19 DIAGNOSIS — R54 Age-related physical debility: Secondary | ICD-10-CM | POA: Diagnosis present

## 2023-06-19 DIAGNOSIS — R4182 Altered mental status, unspecified: Secondary | ICD-10-CM | POA: Diagnosis not present

## 2023-06-19 DIAGNOSIS — K3189 Other diseases of stomach and duodenum: Secondary | ICD-10-CM | POA: Diagnosis present

## 2023-06-19 DIAGNOSIS — Z87898 Personal history of other specified conditions: Secondary | ICD-10-CM

## 2023-06-19 DIAGNOSIS — I480 Paroxysmal atrial fibrillation: Secondary | ICD-10-CM | POA: Diagnosis present

## 2023-06-19 DIAGNOSIS — I959 Hypotension, unspecified: Secondary | ICD-10-CM | POA: Diagnosis not present

## 2023-06-19 DIAGNOSIS — R627 Adult failure to thrive: Secondary | ICD-10-CM | POA: Diagnosis not present

## 2023-06-19 DIAGNOSIS — E1122 Type 2 diabetes mellitus with diabetic chronic kidney disease: Secondary | ICD-10-CM | POA: Diagnosis present

## 2023-06-19 DIAGNOSIS — K319 Disease of stomach and duodenum, unspecified: Secondary | ICD-10-CM | POA: Diagnosis not present

## 2023-06-19 DIAGNOSIS — L89610 Pressure ulcer of right heel, unstageable: Secondary | ICD-10-CM | POA: Diagnosis present

## 2023-06-19 DIAGNOSIS — E785 Hyperlipidemia, unspecified: Secondary | ICD-10-CM | POA: Diagnosis present

## 2023-06-19 DIAGNOSIS — Z992 Dependence on renal dialysis: Secondary | ICD-10-CM | POA: Diagnosis not present

## 2023-06-19 DIAGNOSIS — K811 Chronic cholecystitis: Secondary | ICD-10-CM | POA: Diagnosis not present

## 2023-06-19 DIAGNOSIS — Z85831 Personal history of malignant neoplasm of soft tissue: Secondary | ICD-10-CM

## 2023-06-19 DIAGNOSIS — D631 Anemia in chronic kidney disease: Secondary | ICD-10-CM | POA: Diagnosis present

## 2023-06-19 DIAGNOSIS — A419 Sepsis, unspecified organism: Secondary | ICD-10-CM | POA: Diagnosis present

## 2023-06-19 DIAGNOSIS — R64 Cachexia: Secondary | ICD-10-CM | POA: Diagnosis present

## 2023-06-19 DIAGNOSIS — Z681 Body mass index (BMI) 19 or less, adult: Secondary | ICD-10-CM

## 2023-06-19 DIAGNOSIS — I864 Gastric varices: Secondary | ICD-10-CM | POA: Diagnosis not present

## 2023-06-19 DIAGNOSIS — Z79899 Other long term (current) drug therapy: Secondary | ICD-10-CM

## 2023-06-19 DIAGNOSIS — Z960 Presence of urogenital implants: Secondary | ICD-10-CM

## 2023-06-19 DIAGNOSIS — M6250 Muscle wasting and atrophy, not elsewhere classified, unspecified site: Secondary | ICD-10-CM | POA: Diagnosis present

## 2023-06-19 DIAGNOSIS — Z5941 Food insecurity: Secondary | ICD-10-CM

## 2023-06-19 DIAGNOSIS — Z87891 Personal history of nicotine dependence: Secondary | ICD-10-CM

## 2023-06-19 DIAGNOSIS — G928 Other toxic encephalopathy: Secondary | ICD-10-CM | POA: Diagnosis present

## 2023-06-19 DIAGNOSIS — M869 Osteomyelitis, unspecified: Secondary | ICD-10-CM

## 2023-06-19 DIAGNOSIS — C49A0 Gastrointestinal stromal tumor, unspecified site: Secondary | ICD-10-CM

## 2023-06-19 DIAGNOSIS — I120 Hypertensive chronic kidney disease with stage 5 chronic kidney disease or end stage renal disease: Secondary | ICD-10-CM | POA: Diagnosis present

## 2023-06-19 DIAGNOSIS — E876 Hypokalemia: Secondary | ICD-10-CM | POA: Diagnosis present

## 2023-06-19 DIAGNOSIS — G9341 Metabolic encephalopathy: Secondary | ICD-10-CM

## 2023-06-19 DIAGNOSIS — Z5982 Transportation insecurity: Secondary | ICD-10-CM

## 2023-06-19 LAB — COMPREHENSIVE METABOLIC PANEL
ALT: 13 U/L (ref 0–44)
AST: 23 U/L (ref 15–41)
Albumin: 2.5 g/dL — ABNORMAL LOW (ref 3.5–5.0)
Alkaline Phosphatase: 67 U/L (ref 38–126)
Anion gap: 11 (ref 5–15)
BUN: 35 mg/dL — ABNORMAL HIGH (ref 8–23)
CO2: 30 mmol/L (ref 22–32)
Calcium: 10.2 mg/dL (ref 8.9–10.3)
Chloride: 99 mmol/L (ref 98–111)
Creatinine, Ser: 5.61 mg/dL — ABNORMAL HIGH (ref 0.61–1.24)
GFR, Estimated: 10 mL/min — ABNORMAL LOW (ref >60)
Glucose, Bld: 87 mg/dL (ref 70–99)
Potassium: 3.3 mmol/L — ABNORMAL LOW (ref 3.5–5.1)
Sodium: 140 mmol/L (ref 135–145)
Total Bilirubin: 1 mg/dL (ref <1.2)
Total Protein: 8.2 g/dL — ABNORMAL HIGH (ref 6.5–8.1)

## 2023-06-19 LAB — CBC WITH DIFFERENTIAL/PLATELET
Abs Immature Granulocytes: 0.03 10*3/uL (ref 0.00–0.07)
Basophils Absolute: 0 10*3/uL (ref 0.0–0.1)
Basophils Relative: 0 %
Eosinophils Absolute: 0 10*3/uL (ref 0.0–0.5)
Eosinophils Relative: 0 %
HCT: 34.3 % — ABNORMAL LOW (ref 39.0–52.0)
Hemoglobin: 10.2 g/dL — ABNORMAL LOW (ref 13.0–17.0)
Immature Granulocytes: 0 %
Lymphocytes Relative: 12 %
Lymphs Abs: 1.3 10*3/uL (ref 0.7–4.0)
MCH: 29.7 pg (ref 26.0–34.0)
MCHC: 29.7 g/dL — ABNORMAL LOW (ref 30.0–36.0)
MCV: 99.7 fL (ref 80.0–100.0)
Monocytes Absolute: 0.8 10*3/uL (ref 0.1–1.0)
Monocytes Relative: 7 %
Neutro Abs: 8.9 10*3/uL — ABNORMAL HIGH (ref 1.7–7.7)
Neutrophils Relative %: 81 %
Platelets: 140 10*3/uL — ABNORMAL LOW (ref 150–400)
RBC: 3.44 MIL/uL — ABNORMAL LOW (ref 4.22–5.81)
RDW: 19.8 % — ABNORMAL HIGH (ref 11.5–15.5)
WBC: 11.1 10*3/uL — ABNORMAL HIGH (ref 4.0–10.5)
nRBC: 0 % (ref 0.0–0.2)

## 2023-06-19 LAB — LACTIC ACID, PLASMA: Lactic Acid, Venous: 1.1 mmol/L (ref 0.5–1.9)

## 2023-06-19 MED ORDER — VANCOMYCIN HCL IN DEXTROSE 1-5 GM/200ML-% IV SOLN
1000.0000 mg | Freq: Once | INTRAVENOUS | Status: AC
  Administered 2023-06-20: 1000 mg via INTRAVENOUS
  Filled 2023-06-19: qty 200

## 2023-06-19 MED ORDER — IOHEXOL 300 MG/ML  SOLN
100.0000 mL | Freq: Once | INTRAMUSCULAR | Status: AC | PRN
  Administered 2023-06-19: 100 mL via INTRAVENOUS

## 2023-06-19 MED ORDER — MIDODRINE HCL 5 MG PO TABS: 5.0000 mg | ORAL_TABLET | Freq: Three times a day (TID) | ORAL | Status: DC

## 2023-06-19 MED ORDER — MIDODRINE HCL 5 MG PO TABS
5.0000 mg | ORAL_TABLET | Freq: Three times a day (TID) | ORAL | Status: DC
  Administered 2023-06-19 – 2023-06-21 (×6): 5 mg via ORAL
  Filled 2023-06-19 (×7): qty 1

## 2023-06-19 MED ORDER — SODIUM CHLORIDE 0.9 % IV BOLUS (SEPSIS)
1000.0000 mL | Freq: Once | INTRAVENOUS | Status: AC
  Administered 2023-06-20: 1000 mL via INTRAVENOUS

## 2023-06-19 MED ORDER — SODIUM CHLORIDE 0.9 % IV SOLN
2.0000 g | Freq: Once | INTRAVENOUS | Status: AC
  Administered 2023-06-19: 2 g via INTRAVENOUS
  Filled 2023-06-19: qty 12.5

## 2023-06-19 MED ORDER — SODIUM CHLORIDE 0.9 % IV BOLUS (SEPSIS)
1000.0000 mL | Freq: Once | INTRAVENOUS | Status: AC
  Administered 2023-06-19: 1000 mL via INTRAVENOUS

## 2023-06-19 NOTE — ED Provider Notes (Addendum)
[facility] Provider Note    Event Date/Time   First MD Initiated Contact with Patient 06/19/23 1843     (approximate)   History   Altered Mental Status   HPI  Justin [name] is a 77 y.o. male who present to the emergency department today because of concerns for altered mental status.  Patient is coming from a rehab facility.  He had recent discharge from the hospital for acute on chronic blood loss anemia.  Thought secondary to GI bleed.  Patient himself states that he cannot talk right now. But answers no when asked about pain, shortness of breath.     Physical Exam   Triage Vital Signs: ED Triage Vitals  Encounter Vitals Group     BP --      Systolic BP Percentile --      Diastolic BP Percentile --      Pulse --      Resp --      Temp --      Temp src --      SpO2 --      Weight 06/19/23 1845 149 lb 8 oz (67.8 kg)     Height 06/19/23 1845 5\' 10"  (1.778 m)     Head Circumference --      Peak Flow --      Pain Score 06/19/23 1844 0     Pain Loc --      Pain Education --      Exclude from Growth Chart --     Most recent vital signs: There were no vitals filed for this visit.  General: Awake, alert, not completely oriented. CV:  Good peripheral perfusion. Regular rate and rhythm. Resp:  Normal effort. Lungs clear. Abd:  No distention. Non tender.   ED Results / Procedures / Treatments   Labs (all labs ordered are listed, but only abnormal results are displayed) Labs Reviewed  COMPREHENSIVE METABOLIC PANEL - Abnormal; Notable for the following components:      Result Value   Potassium 3.3 (*)    BUN 35 (*)    Creatinine, Ser 5.61 (*)    Total Protein 8.2 (*)    Albumin 2.5 (*)    GFR, Estimated 10 (*)    All other components within normal limits  CBC WITH DIFFERENTIAL/PLATELET - Abnormal; Notable for the following components:   WBC 11.1 (*)    RBC 3.44 (*)    Hemoglobin 10.2 (*)    HCT 34.3 (*)    MCHC 29.7 (*)    RDW 19.8  (*)    Platelets 140 (*)    Neutro Abs 8.9 (*)    All other components within normal limits  CULTURE, BLOOD (ROUTINE X 2)  CULTURE, BLOOD (ROUTINE X 2)  LACTIC ACID, PLASMA  URINALYSIS, W/ REFLEX TO CULTURE (INFECTION SUSPECTED)     EKG  I, Phineas Semen, attending physician, personally viewed and interpreted this EKG  EKG Time: 1853 Rate: 91 Rhythm: atrial rhythm Axis: normal Intervals: qtc 640 QRS: narrow, q waves v1 ST changes: no st elevation Impression: abnormal ekg   RADIOLOGY I independently interpreted and visualized the CT head. My interpretation: No bleed Radiology interpretation:  IMPRESSION:  Atrophy, chronic microvascular disease.    No acute intracranial abnormality.    I independently interpreted and visualized the CXR. My interpretation: No pneumonia IMPRESSION:  No acute cardiopulmonary disease.      PROCEDURES:  Critical Care performed: No    MEDICATIONS ORDERED  IN ED: Medications - No data to display   IMPRESSION / MDM / ASSESSMENT AND PLAN / ED COURSE  I reviewed the triage vital signs and the nursing notes.                              Differential diagnosis includes, but is not limited to, infection, anemia, dehydration, electrolyte abnormality, medication side effect  Patient's presentation is most consistent with acute presentation with potential threat to life or bodily function.   The patient is on the cardiac monitor to evaluate for evidence of arrhythmia and/or significant heart rate changes.  Patient presented to the emergency department today because of concerns for altered mental status.  The patient is unable to give good history.  On exam patient is unable to give good history.  Will check blood work looking for signs of infection, dehydration.  Will check head CT.   Family member did present to the emergency department.  States that when they saw him yesterday he was quite somnolent.  When they checked on him again  today he continued to be somnolent and additionally was complaining of lower abdominal pain.  So asked staff to transport him to the hospital.    Head CT, chest x-ray without concerning abnormalities.  Blood work without significant leukocytosis or electrolyte abnormality.  Given additional history of lower abdominal pain will get CT scan of the abdomen.  Do think patient will require admission regardless of his CT results.  CT abd/pel concerning for gastric mass, however no other acute abnormality in the abdomen. Did raise concern for pneumonia however. Do think this could explain the patient's weakness. Will order antibiotics.   FINAL CLINICAL IMPRESSION(S) / ED DIAGNOSES   Somnolence    Note:  This document was prepared using Dragon voice recognition software and may include unintentional dictation errors.    Phineas Semen, MD 06/19/23 2224    Phineas Semen, MD 06/19/23 [phone]

## 2023-06-19 NOTE — ED Triage Notes (Signed)
Patient comes in via [facility] from the [facility] facility. According to EMS, the patient has been altered the past couple of days. Pt missed dialysis treatment today, due to not feeling weak. Pt is alert and oriented x3. PT is very lethargic at this time, but has no complaints of pain at this time. Pt with no signs of acute distress at this time.

## 2023-06-20 DIAGNOSIS — Z960 Presence of urogenital implants: Secondary | ICD-10-CM

## 2023-06-20 DIAGNOSIS — G9341 Metabolic encephalopathy: Secondary | ICD-10-CM

## 2023-06-20 DIAGNOSIS — E43 Unspecified severe protein-calorie malnutrition: Secondary | ICD-10-CM

## 2023-06-20 DIAGNOSIS — R6520 Severe sepsis without septic shock: Secondary | ICD-10-CM

## 2023-06-20 DIAGNOSIS — A419 Sepsis, unspecified organism: Secondary | ICD-10-CM | POA: Diagnosis not present

## 2023-06-20 DIAGNOSIS — J189 Pneumonia, unspecified organism: Secondary | ICD-10-CM

## 2023-06-20 DIAGNOSIS — M869 Osteomyelitis, unspecified: Secondary | ICD-10-CM

## 2023-06-20 DIAGNOSIS — Z87898 Personal history of other specified conditions: Secondary | ICD-10-CM

## 2023-06-20 LAB — COMPREHENSIVE METABOLIC PANEL
ALT: 13 U/L (ref 0–44)
AST: 22 U/L (ref 15–41)
Albumin: 2 g/dL — ABNORMAL LOW (ref 3.5–5.0)
Alkaline Phosphatase: 54 U/L (ref 38–126)
Anion gap: 10 (ref 5–15)
BUN: 35 mg/dL — ABNORMAL HIGH (ref 8–23)
CO2: 26 mmol/L (ref 22–32)
Calcium: 9.6 mg/dL (ref 8.9–10.3)
Chloride: 102 mmol/L (ref 98–111)
Creatinine, Ser: 5.8 mg/dL — ABNORMAL HIGH (ref 0.61–1.24)
GFR, Estimated: 9 mL/min — ABNORMAL LOW (ref >60)
Glucose, Bld: 91 mg/dL (ref 70–99)
Potassium: 2.9 mmol/L — ABNORMAL LOW (ref 3.5–5.1)
Sodium: 138 mmol/L (ref 135–145)
Total Bilirubin: 1 mg/dL (ref <1.2)
Total Protein: 6.5 g/dL (ref 6.5–8.1)

## 2023-06-20 LAB — HEPATITIS B SURFACE ANTIGEN: Hepatitis B Surface Ag: NONREACTIVE

## 2023-06-20 LAB — CBC
HCT: 29.5 % — ABNORMAL LOW (ref 39.0–52.0)
Hemoglobin: 8.8 g/dL — ABNORMAL LOW (ref 13.0–17.0)
MCH: 29.7 pg (ref 26.0–34.0)
MCHC: 29.8 g/dL — ABNORMAL LOW (ref 30.0–36.0)
MCV: 99.7 fL (ref 80.0–100.0)
Platelets: 122 10*3/uL — ABNORMAL LOW (ref 150–400)
RBC: 2.96 MIL/uL — ABNORMAL LOW (ref 4.22–5.81)
RDW: 19.9 % — ABNORMAL HIGH (ref 11.5–15.5)
WBC: 12.3 10*3/uL — ABNORMAL HIGH (ref 4.0–10.5)
nRBC: 0 % (ref 0.0–0.2)

## 2023-06-20 LAB — GLUCOSE, CAPILLARY
Glucose-Capillary: 59 mg/dL — ABNORMAL LOW (ref 70–99)
Glucose-Capillary: 80 mg/dL (ref 70–99)
Glucose-Capillary: 73 mg/dL (ref 70–99)
Glucose-Capillary: 130 mg/dL — ABNORMAL HIGH (ref 70–99)

## 2023-06-20 LAB — HEMOGLOBIN A1C
Hgb A1c MFr Bld: 3.8 % — ABNORMAL LOW (ref 4.8–5.6)
Mean Plasma Glucose: 62.36 mg/dL

## 2023-06-20 LAB — MRSA NEXT GEN BY PCR, NASAL: MRSA by PCR Next Gen: NOT DETECTED

## 2023-06-20 MED ORDER — LACTATED RINGERS IV SOLN: INTRAVENOUS | Status: AC

## 2023-06-20 MED ORDER — INSULIN ASPART 100 UNIT/ML IJ SOLN: 0.0000 [IU] | Freq: Every day | INTRAMUSCULAR | Status: DC

## 2023-06-20 MED ORDER — SODIUM CHLORIDE 0.9 % IV SOLN
1.0000 g | INTRAVENOUS | Status: DC
  Administered 2023-06-20 – 2023-06-23 (×4): 1 g via INTRAVENOUS
  Filled 2023-06-20 (×4): qty 10

## 2023-06-20 MED ORDER — ACETAMINOPHEN 325 MG PO TABS: 650.0000 mg | ORAL_TABLET | Freq: Four times a day (QID) | ORAL | Status: DC | PRN

## 2023-06-20 MED ORDER — GLUCOSE 40 % PO GEL
1.0000 | ORAL | Status: AC
  Filled 2023-06-20: qty 1

## 2023-06-20 MED ORDER — VANCOMYCIN HCL 500 MG/100ML IV SOLN
500.0000 mg | Freq: Once | INTRAVENOUS | Status: AC
  Administered 2023-06-20: 500 mg via INTRAVENOUS
  Filled 2023-06-20: qty 100

## 2023-06-20 MED ORDER — CHLORHEXIDINE GLUCONATE CLOTH 2 % EX PADS
6.0000 | MEDICATED_PAD | Freq: Every day | CUTANEOUS | Status: DC
Start: 2023-06-20 — End: 2023-06-24
  Administered 2023-06-20 – 2023-06-24 (×5): 6 via TOPICAL
  Filled 2023-06-20: qty 6

## 2023-06-20 MED ORDER — DEXTROSE 50 % IV SOLN
50.0000 mL | Freq: Once | INTRAVENOUS | Status: AC
  Administered 2023-06-20: 50 mL via INTRAVENOUS
  Filled 2023-06-20: qty 50

## 2023-06-20 MED ORDER — VANCOMYCIN HCL 750 MG/150ML IV SOLN
750.0000 mg | Freq: Once | INTRAVENOUS | Status: DC
  Filled 2023-06-20: qty 150

## 2023-06-20 MED ORDER — DEXTROSE 50 % IV SOLN
12.5000 g | INTRAVENOUS | Status: AC
Start: 2023-06-20 — End: 2023-06-20
  Administered 2023-06-20: 12.5 g via INTRAVENOUS

## 2023-06-20 MED ORDER — VANCOMYCIN HCL 750 MG/150ML IV SOLN
750.0000 mg | INTRAVENOUS | Status: DC
Start: 2023-06-22 — End: 2023-06-21

## 2023-06-20 MED ORDER — ACETAMINOPHEN 650 MG RE SUPP: 650.0000 mg | Freq: Four times a day (QID) | RECTAL | Status: DC | PRN

## 2023-06-20 MED ORDER — INSULIN ASPART 100 UNIT/ML IJ SOLN: 0.0000 [IU] | Freq: Three times a day (TID) | INTRAMUSCULAR | Status: DC

## 2023-06-20 MED ORDER — HEPARIN SODIUM (PORCINE) 5000 UNIT/ML IJ SOLN
5000.0000 [IU] | Freq: Three times a day (TID) | INTRAMUSCULAR | Status: DC
  Administered 2023-06-20 – 2023-06-24 (×12): 5000 [IU] via SUBCUTANEOUS
  Filled 2023-06-20 (×12): qty 1

## 2023-06-20 MED ORDER — DEXTROSE 50 % IV SOLN
INTRAVENOUS | Status: AC
  Filled 2023-06-20: qty 50

## 2023-06-20 MED ORDER — POTASSIUM CHLORIDE CRYS ER 20 MEQ PO TBCR
40.0000 meq | EXTENDED_RELEASE_TABLET | ORAL | Status: AC
  Administered 2023-06-20 (×2): 40 meq via ORAL
  Filled 2023-06-20 (×2): qty 2

## 2023-06-20 NOTE — Assessment & Plan Note (Signed)
No acute issues suspected 

## 2023-06-20 NOTE — Assessment & Plan Note (Addendum)
Frailty-nonambulant at baseline Patient with 2 fairly recent prolonged hospitalizations in October and November 2024 Patient cachectic with temporal muscle wasting and prominence of bony eminences Dietary consult

## 2023-06-20 NOTE — Assessment & Plan Note (Signed)
Received Justin [name] during HD for 6 weeks.  Last received 12/3 Follows with ID

## 2023-06-20 NOTE — Progress Notes (Signed)
Progress Note   Patient: Justin [name] ZOX:[id number] DOB: 05/14/1946 DOA: 06/19/2023     1 DOS: the patient was seen and examined on 06/20/2023   Brief hospital course: From HPI "Justin [name] is a 77 y.o. 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 Most of the history is given by his stepdaughter Beverley Fiedler at the bedside, who states that at baseline patient is very conversant and interactive however since Thanksgiving on 11/28, and especially over the last 4 days he has become increasingly more somnolent.  He missed his dialysis session on the day of arrival making his last session Thursday 12/5.Marland Kitchen  She reports that he has had a cough for the past 4 days. ED course and data review: On arrival BP 96/60 downtrending to 81/45 for which he received a dose of midodrine.  Pulse 88, Tmax 99.3.  O2 sat 92% on 4 L with no prior O2 requirement. Labs notable for WBC 11,000 with lactic acid 1.1 Hemoglobin 10.2, up from 7.8 at discharge a couple weeks prior and platelets 140,000 LFTs unremarkable Potassium 3.3. 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 Endoscopy is suggested for further evaluation.  Cholelithiasis with indwelling percutaneous cholecystostomy.  Left lower lobe pneumonia,  incompletely visualized. Trace left pleural fluid.   Patient started on sepsis fluids cefepime and vancomycin.  "  Assessment and Plan:  Severe sepsis (HCC) Healthcare acquired pneumonia Presence of Biliary drain (exchanged 10/24) and tunneled RIJ dialysis catheter (placed 10/7) History of Klebsiella bacteremia 04/2023 History of ischial osteomyelitis 04/2023 s/p Fortaz Sepsis criteria include hypertension, low-grade temp tachycardia, hypotension and relative hypoxia with leukocytosis and lung infiltrate on CT Left lower lobe pneumonia seen on CT abdomen and pelvis Patient has a biliary drain and dialysis catheter which can also be potential sources of infection Continue cefepime and vancomycin Monitor for fluid overload given dialysis session with missed session on the day of arrival  Nephrologist on board and case discussed   History of Klebsiella bacteremia 04/15/2023 Received Elita Quick during HD for 6 weeks.  Last received 12/3 Follows with ID    Gastric mass Previous imaging as well as current CT scan of the abdomen showing findings of gastric mass We will inform gastroenterologist for possible endoscopy and biopsy  Chronic hypotension Persistently low blood pressures in the ED in part related to sepsis Received midodrine with only transient effect IV fluid resuscitation for suspected sepsis Monitor blood pressure closely Continue current antibiotics   Acute metabolic encephalopathy Secondary to sepsis CT head nonacute Diet resumed Fall and aspiration precautions   Indwelling percutaneous cholecystostomy drain present History of cholecystitis, not a surgical candidate Percutaneous drain bag, draining well, with thick yellowish-green sludgy material Last exchanged 05/06/2023 Potential source of infection Follow-up on culture results   History of GI bleed Gastric varices, 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  Will hold dose tonight due to n.p.o. status No longer on Eliquis since GI bleed 05/2023   ESRD on hemodialysis [facility]) Nephrology consulted for continuation of dialysis.  Patient missed session on 12/11 Appears clinically dry and without evidence of fluid overload   DM (diabetes mellitus) (HCC) Sliding scale insulin coverage   Protein-calorie malnutrition, severe (HCC) Frailty-nonambulant at baseline Patient with 2 fairly recent prolonged hospitalizations in October and November 2024 Patient cachectic with temporal muscle wasting and prominence of bony eminences Dietary consulted   History of prostate cancer No acute issues suspected   History of ischial osteomyelitis s/p treatment (HCC) Completed 8 weeks course of antibiotics and last seen by ID on 06/15/2023    DVT prophylaxis: heparin   Consults: renal, Dr Wynelle Link   Advance Care Planning:   Code Status: Prior    Family Communication: Stepdaughter Beverley Fiedler at bedside   Disposition Plan: Back to previous home environment       Subjective:   Patient seen and examined at bedside this morning Denies nausea vomiting abdominal pain chest pain cough  Physical Exam: Vitals and nursing note reviewed.  Constitutional:      General: He is not in acute distress.    Appearance: He is underweight.  HENT:     Head: Normocephalic and atraumatic.     Mouth/Throat:     Mouth: Mucous membranes are dry.  Cardiovascular:     Rate and Rhythm: Normal rate and regular rhythm.     Heart sounds: Normal heart sounds.  Pulmonary:     Effort: Pulmonary effort is normal.     Breath sounds: Normal breath sounds.  Abdominal:     Palpations: Abdomen is soft.     Tenderness: There is no abdominal tenderness.     Comments: Cholecystostomy with yellowish-green sludgy liquid, in bag  Neurological:     General: No focal deficit present.     Mental Status: He is lethargic.     Vitals:    06/20/23 1425 06/20/23 1500 06/20/23 1530 06/20/23 1600  BP: 137/71 116/71 126/70 127/70  Pulse: 85  (!) 33   Resp: 13 13 14 13   Temp: (!) 96.2 F (35.7 C)     TempSrc: Axillary     SpO2: 98%     Weight: 62.7 kg     Height: 5\' 10"  (1.778 m)       Data Reviewed:    Latest Ref Rng & Units 06/20/2023    5:20 AM 06/19/2023    6:56 PM 06/05/2023   10:18 AM  CBC  WBC 4.0 - 10.5 K/uL 12.3  11.1  6.4   Hemoglobin 13.0 - 17.0 g/dL 8.8  22.0  7.8   Hematocrit 39.0 - 52.0 % 29.5  34.3  24.8   Platelets 150 - 400 K/uL 122  140  216        Latest Ref Rng & Units 06/20/2023    5:20 AM 06/19/2023    6:56 PM 06/05/2023   10:18 AM  BMP  Glucose 70 - 99 mg/dL 91  87  88   BUN 8 - 23 mg/dL 35  35  20   Creatinine 0.61 - 1.24 mg/dL 2.54  2.70  6.23   Sodium 135 - 145 mmol/L 138  140  135   Potassium 3.5 - 5.1 mmol/L 2.9  3.3  3.4   Chloride 98 - 111 mmol/L 102  99  97   CO2 22 - 32 mmol/L 26  30  30    Calcium 8.9 - 10.3 mg/dL 9.6  10.2  9.1   I have reviewed abdominal CT scan  Family Communication: Discussed with patient's daughter  Disposition: Status is: Inpatient   Time spent: 56 minutes  Author: Loyce Dys, MD 06/20/2023 4:33 PM  For on call review [email or web address].

## 2023-06-20 NOTE — Progress Notes (Signed)
Pharmacy Antibiotic Note  Justin [name] is a 77 y.o. male admitted on 06/19/2023 with sepsis.  Pharmacy has been consulted for Vanc, Cefepime dosing.  Pt was on HD T-Th-Sat PTA.  Plan: Cefepime 2 gm IV X 1 given in ED on 12/7 @ 2357. Cefepime 1 gm IV Q24H ordered to start on 12/9 @ 0000.  Vancomycin 1 gm IV X 1 given in ED 12/8 @ 0049.   Additional Vanc 500 mg IV X 1 ordered to make total loading dose of 1500 mg.  Vancomycin 750 mg IV Q T-Th-Sat with HD ordered to start on 12/10 @ 1200.   Height: 5\' 10"  (177.8 cm) Weight: 67.8 kg (149 lb 8 oz) IBW/kg (Calculated) : 73  Temp (24hrs), Avg:97.7 F (36.5 C), Min:96.6 F (35.9 C), Max:99.3 F (37.4 C)  Recent Labs  Lab 06/19/23 1856  WBC 11.1*  CREATININE 5.61*  LATICACIDVEN 1.1    Estimated Creatinine Clearance: 10.2 mL/min (A) (by C-G formula based on SCr of 5.61 mg/dL (H)).    No Known Allergies  Antimicrobials this admission:   >>    >>   Dose adjustments this admission:   Microbiology results:  BCx:   UCx:    Sputum:    MRSA PCR:   Thank you for allowing pharmacy to be a part of this patient's care.  Lamoyne Palencia D 06/20/2023 2:55 AM

## 2023-06-20 NOTE — ED Notes (Signed)
Pt refused to let this RN examine feet. Pt ankles wrapped in gauze.

## 2023-06-20 NOTE — ED Notes (Signed)
Called ICU to give report. RN will call ED RN back.

## 2023-06-20 NOTE — IPAL (Signed)
  Interdisciplinary Goals of Care Family Meeting   Date carried out: 06/20/2023  Location of the meeting: Bedside  Member's involved: Physician, Bedside Registered Nurse, and Family Member or next of kin  Durable Power of Attorney or acting medical decision maker: step daughter at bedside    Discussion: We discussed goals of care for Justin [name] .  I have reviewed medical records including EPIC notes, labs and imaging, assessed the patient and then met with step daughter to discuss major active diagnoses, plan of care, natural trajectory, prognosis, GOC, EOL wishes, disposition and options including Full code/DNI/DNR and the concept of comfort care if DNR is elected. Questions and concerns were addressed.  Stepdaughter wants him to be a full code and while she admits that he has at times expressed desire for natural death she states that to the best of her knowledge of his wishes, he would want to be a full code . Election for Full code status.    Code status:   Code Status: Prior   Disposition: Continue current acute care  Time spent for the meeting: 33    Andris Baumann, MD  06/20/2023, 12:49 AM

## 2023-06-20 NOTE — ED Notes (Signed)
Partial NIH was completed due to pt refusing to participate with neuro check.

## 2023-06-20 NOTE — ED Notes (Signed)
Dr Meriam Sprague at bedside. Nephrologist also saw pt this AM.

## 2023-06-20 NOTE — Assessment & Plan Note (Signed)
Secondary to sepsis CT head nonacute Will keep n.p.o. tonight Neurologic checks Fall and aspiration precautions

## 2023-06-20 NOTE — ED Notes (Signed)
Family member came to bedside, asking for update. Also asking if pt can have ice chips. Messaged Dr Meriam Sprague for both questions.

## 2023-06-20 NOTE — Progress Notes (Signed)
Pt received from ED around 1:30 pm via stretcher. Awake, answers questions appropriately, although irritable. Oriented to person, situation, year and month. Attempted to hit nurses when nurses attempting to obtain assessment, bathe patient and change dressings. Calmer at this time, more cooperative. IVF infusing at 40 cc per hour. Wearing brief on arrival from ED, soiled with moderate amount black tarry stool. States he does not make urine. Noted sacral wounds x 2, stage 2. #1 1/2 x 1 cm #2. Tiny less than 1/2 cm x 1/2 cm. Foam applied. Bilateral feet dressings seen; dressings removed. Bilateral heel wounds noted, unstageable approx 3 cm x 4 cm. Cleansed and xeroform gauze applied due to dressing adhering to wounds.

## 2023-06-20 NOTE — Progress Notes (Signed)
Pt refused po med, as well as meal. Drowsy but arousable. CBG found to be low at 59. 1/2 amp D50 given, and on recheck was 80. Dr. Cristopher Estimable notifed.

## 2023-06-20 NOTE — Assessment & Plan Note (Addendum)
History of cholecystitis, not a surgical candidate Percutaneous drain bag, draining well, with thick yellowish-green sludgy material Last exchanged 05/06/2023 Potential source of infection LFTs WNL Will culture

## 2023-06-20 NOTE — Consult Note (Signed)
[facility]  CONSULT NOTE    Date: 06/20/2023                  Patient Name:  Justin [name]  MRN: [id number]  DOB: March 23, 1946  Age / Sex: 77 y.o., male         PCP: System, Provider Not In                 Service Requesting Consult: Dr. Meriam Sprague                 Reason for Consult: End Stage Renal Disease            History of Present Illness: Mr. Justin [name] is a end stage renal disease patient on hemodialysis who missed his dialysis treatment on both Thursday and Saturday. Patient presents with altered mental status. However he is able to give a full detailed history when examined. Patient has been very somnulent as per family.   Nephrology consulted for continuation of hemodialysis.    Medications: Outpatient medications: (Not in a hospital admission)   Current medications: Current Facility-Administered Medications  Medication Dose Route Frequency Provider Last Rate Last Admin   acetaminophen (TYLENOL) tablet 650 mg  650 mg Oral Q6H PRN Andris Baumann, MD       Or   acetaminophen (TYLENOL) suppository 650 mg  650 mg Rectal Q6H PRN Andris Baumann, MD       [START ON 06/21/2023] ceFEPIme (MAXIPIME) 1 g in sodium chloride 0.9 % 100 mL IVPB  1 g Intravenous Q24H Lindajo Royal V, MD       heparin injection 5,000 Units  5,000 Units Subcutaneous Q8H Andris Baumann, MD   5,000 Units at 06/20/23 7829   lactated ringers infusion   Intravenous Continuous Andris Baumann, MD 40 mL/hr at 06/20/23 1044 Infusion Verify at 06/20/23 1044   midodrine (PROAMATINE) tablet 5 mg  5 mg Oral TID WC Phineas Semen, MD   5 mg at 06/20/23 0841   potassium chloride SA (KLOR-CON M) CR tablet 40 mEq  40 mEq Oral Q4H Rosezetta Schlatter T, MD   40 mEq at 06/20/23 1058   [START ON 06/22/2023] vancomycin (VANCOREADY) IVPB 750 mg/150 mL  750 mg Intravenous Q T,Th,Sa-HD Andris Baumann, MD       Current Outpatient Medications  Medication Sig Dispense Refill   atorvastatin (LIPITOR) 40  MG tablet Take 40 mg by mouth at bedtime.     GLUCAGON, RDNA, IJ Inject 1 mg into the muscle as needed (FOR BLOOD SUGAR BELOW 60 (HYPOGLYCEMIA)).     mirtazapine (REMERON) 7.5 MG tablet Take 7.5 mg by mouth at bedtime.     multivitamin (RENA-VIT) TABS tablet Take 1 tablet by mouth daily with supper.     pregabalin (LYRICA) 75 MG capsule Take 1 capsule (75 mg total) by mouth 2 (two) times daily. 6 capsule 0   sucroferric oxyhydroxide (VELPHORO) 500 MG chewable tablet Chew 1,000 mg by mouth 3 (three) times daily with meals.     acetaminophen (TYLENOL) 325 MG tablet Take 2 tablets (650 mg total) by mouth every 6 (six) hours as needed for mild pain (or Fever >/= 101). (Patient not taking: Reported on 06/20/2023)     amiodarone (PACERONE) 200 MG tablet Take 1 tablet (200 mg total) by mouth daily. 60 tablet 0   calcitRIOL (ROCALTROL) 0.25 MCG capsule Take 3 capsules (0.75 mcg total) by mouth every Monday, Wednesday, and Friday with  hemodialysis. (Patient taking differently: Take 0.75 mcg by mouth as directed. Take 3 capsules (0.75 mcg) once a day on Tuesday, Thursday and Saturday.)     cefTAZidime 1 g in sodium chloride 0.9 % 100 mL Inject 1 g into the vein every Monday, Wednesday, and Friday with hemodialysis. (Patient not taking: Reported on 06/20/2023) 18 vial 0   midodrine (PROAMATINE) 10 MG tablet Take 10 mg by mouth 3 (three) times daily.     midodrine (PROAMATINE) 5 MG tablet Take 2 tablets (10 mg total) by mouth 3 (three) times daily with meals. (Patient not taking: Reported on 06/20/2023)     omeprazole (PRILOSEC) 20 MG capsule Take 20 mg by mouth 2 (two) times daily before a meal.     pantoprazole (PROTONIX) 40 MG tablet Take 1 tablet (40 mg total) by mouth 2 (two) times daily before a meal. (Patient not taking: Reported on 06/14/2023)     PERCOCET 5-325 MG tablet Take 1 tablet by mouth every 8 (eight) hours as needed. 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) 12/07/2019   Dialysis patient [facility])    ESRD (end stage renal disease) on dialysis (HCC)    Gout    Hypertension    Hypotension 06/13/2022   Right posterior capsular opacification 02/27/2021   Septic shock (HCC)    Vitreous hemorrhage of right eye (HCC) 10/24/2019     Past Surgical History: Past Surgical History:  Procedure Laterality Date   A/V FISTULAGRAM Left 08/17/2017   Procedure: A/V FISTULAGRAM;  Surgeon: Renford Dills, MD;  Location: [facility] INVASIVE CV LAB;  Service: Cardiovascular;  Laterality: Left;   A/V SHUNT INTERVENTION N/A 08/17/2017   Procedure: A/V SHUNT INTERVENTION;  Surgeon: Renford Dills, MD;  Location: [facility] INVASIVE CV LAB;  Service: Cardiovascular;  Laterality: N/A;   AV FISTULA PLACEMENT Left 05/31/2015   Procedure: ARTERIOVENOUS (AV) FISTULA CREATION;  Surgeon: Renford Dills, MD;  Location: [facility] ORS;  Service: Vascular;  Laterality: Left;   COLONOSCOPY N/A 12/17/2022   Procedure: COLONOSCOPY;  Surgeon: Napoleon Form, MD;  Location: [facility] ENDOSCOPY;  Service: Gastroenterology;  Laterality: N/A;   COLONOSCOPY WITH PROPOFOL N/A 09/11/2022   Procedure: COLONOSCOPY WITH PROPOFOL;  Surgeon: Imogene Burn, MD;  Location: [facility] ENDOSCOPY;  Service: Gastroenterology;  Laterality: N/A;   COLONOSCOPY WITH PROPOFOL N/A 09/17/2022   Procedure: COLONOSCOPY WITH PROPOFOL;  Surgeon: Benancio Deeds, MD;  Location: [facility] ENDOSCOPY;  Service: Gastroenterology;  Laterality: N/A;   ESOPHAGOGASTRODUODENOSCOPY (EGD) WITH PROPOFOL N/A  09/11/2022   Procedure: ESOPHAGOGASTRODUODENOSCOPY (EGD) WITH PROPOFOL;  Surgeon: Imogene Burn, MD;  Location: [facility] ENDOSCOPY;  Service: Gastroenterology;  Laterality: N/A;   ESOPHAGOGASTRODUODENOSCOPY (EGD) WITH PROPOFOL N/A 12/17/2022   Procedure: ESOPHAGOGASTRODUODENOSCOPY (EGD) WITH PROPOFOL;  Surgeon: Napoleon Form, MD;  Location: [facility] ENDOSCOPY;  Service: Gastroenterology;  Laterality: N/A;   excision bone spurs Bilateral 1989   feet   GIVENS CAPSULE STUDY N/A 11/10/2022   Procedure: GIVENS CAPSULE STUDY;  Surgeon: Shellia Cleverly, DO;  Location: [facility] ENDOSCOPY;  Service: Gastroenterology;  Laterality: N/A;   HEMOSTASIS CLIP PLACEMENT  09/11/2022   Procedure: HEMOSTASIS CLIP PLACEMENT;  Surgeon: Imogene Burn, MD;  Location: [facility] ENDOSCOPY;  Service: Gastroenterology;;   HEMOSTASIS CLIP PLACEMENT  09/17/2022   Procedure: HEMOSTASIS CLIP PLACEMENT;  Surgeon: Benancio Deeds, MD;  Location: [facility] ENDOSCOPY;  Service: Gastroenterology;;   HEMOSTASIS CLIP PLACEMENT  12/17/2022   Procedure: HEMOSTASIS CLIP PLACEMENT;  Surgeon: Napoleon Form, MD;  Location: [facility] ENDOSCOPY;  Service: Gastroenterology;;   HOT HEMOSTASIS N/A 09/11/2022   Procedure: HOT HEMOSTASIS (ARGON PLASMA COAGULATION/BICAP);  Surgeon: Imogene Burn, MD;  Location: [facility] ENDOSCOPY;  Service: Gastroenterology;  Laterality: N/A;   HOT HEMOSTASIS N/A 09/17/2022   Procedure: HOT HEMOSTASIS (ARGON PLASMA COAGULATION/BICAP);  Surgeon: Benancio Deeds, MD;  Location: [facility] ENDOSCOPY;  Service: Gastroenterology;  Laterality: N/A;   INCISION AND DRAINAGE PERIRECTAL ABSCESS N/A 06/15/2022   Procedure: IRRIGATION AND DEBRIDEMENT PERIRECTAL ABSCESS;  Surgeon: Franky Macho, MD;  Location: [facility] ORS;  Service: General;  Laterality: N/A;   IR EXCHANGE BILIARY DRAIN  03/16/2023   IR EXCHANGE BILIARY DRAIN  05/06/2023   IR FLUORO GUIDE CV LINE LEFT  04/19/2023   IR REMOVAL TUN CV CATH W/O FL  04/16/2023   IR US GUIDE VASC ACCESS LEFT  04/20/2023   IR US  GUIDE VASC ACCESS RIGHT  04/19/2023   IR VENO/JUGULAR RIGHT  04/19/2023   KNEE SURGERY Left 1998   arthroscopy   POLYPECTOMY  09/11/2022   Procedure: POLYPECTOMY;  Surgeon: Imogene Burn, MD;  Location: [facility] ENDOSCOPY;  Service: Gastroenterology;;   SHOULDER SURGERY Left 1994   rotator cuff     Family History: Family History  Problem Relation Age of Onset   Kidney disease Mother    Alcoholism Father    Alcoholism Brother    Colon cancer Neg Hx    Stomach cancer Neg Hx    Esophageal cancer Neg Hx      Social History: Social History   Socioeconomic History   Marital status: Widowed    Spouse name: Not on file   Number of children: Not on file   Years of education: Not on file   Highest education level: Not on file  Occupational History   Not on file  Tobacco Use   Smoking status: Former    Current packs/day: 0.00    Types: Cigarettes    Quit date: 10/01/1978    Years since quitting: 44.7   Smokeless tobacco: Never  Vaping Use   Vaping status: Never Used  Substance and Sexual Activity   Alcohol use: No   Drug use: No   Sexual activity: Not on file  Other Topics Concern   Not on file  Social History Narrative   Not on file   Social Determinants of Health   Financial Resource Strain: Not on file  Food Insecurity: No Food Insecurity (06/01/2023)   Hunger Vital Sign    Worried About Running Out of Food in the Last Year: Never true    Ran Out of Food in the Last Year: Never true  Recent Concern: Food Insecurity - Food Insecurity Present (04/13/2023)   Hunger Vital Sign    Worried About Running Out of Food in the Last Year: Sometimes true    Ran Out of Food in the Last Year: Never true  Transportation Needs: Unmet Transportation Needs (06/01/2023)   PRAPARE - Administrator, Civil Service (Medical): Yes    Lack  of Transportation (Non-Medical): Yes  Physical Activity: Not on file  Stress: No Stress Concern Present (10/14/2022)   Received from [facility] - Occupational Stress Questionnaire    Feeling of Stress : Not at all  Social Connections: Unknown (10/13/2022)   Received from [facility]   Social Network    Social Network: Not on file  Intimate Partner Violence: Not At Risk (06/01/2023)   Humiliation, Afraid, Rape, and Kick questionnaire    Fear of Current or Ex-Partner: No    Emotionally Abused: No    Physically Abused: No    Sexually Abused: No       Vital Signs: Blood pressure (!) 109/55, pulse 72, temperature (!) 97.5 F (36.4 C), temperature source Axillary, resp. rate 16, height 5\' 10"  (1.778 m), weight 67.8 kg, SpO2 99%.  Weight trends: Filed Weights   06/19/23 1845  Weight: 67.8 kg    Physical Exam: General: NAD, laying in bed  Head: Normocephalic, atraumatic. Moist oral mucosal membranes  Eyes: Anicteric, PERRL  Neck: Supple, trachea midline  Lungs:  Clear to auscultation  Heart: Regular rate and rhythm  Abdomen:  Soft, nontender,   Extremities:  + peripheral edema.  Neurologic: Nonfocal, moving all four extremities  Skin: No lesions  Access: LIJ permcath     Lab results: Basic Metabolic Panel: Recent Labs  Lab 06/19/23 1856 06/20/23 0520  NA 140 138  K 3.3* 2.9*  CL 99 102  CO2 30 26  GLUCOSE 87 91  BUN 35* 35*  CREATININE 5.61* 5.80*  CALCIUM 10.2 9.6    Liver Function Tests: Recent Labs  Lab 06/19/23 1856 06/20/23 0520  AST 23 22  ALT 13 13  ALKPHOS 67 54  BILITOT 1.0 1.0  PROT 8.2* 6.5  ALBUMIN 2.5* 2.0*   No results for input(s): "LIPASE", "AMYLASE" in the last 168 hours. No results for input(s): "AMMONIA" in the last 168 hours.  CBC: Recent Labs  Lab 06/19/23 1856 06/20/23 0520  WBC 11.1* 12.3*  NEUTROABS 8.9*  --   HGB 10.2* 8.8*  HCT 34.3* 29.5*  MCV 99.7 99.7  PLT 140* 122*    Cardiac Enzymes: No results for input(s): "CKTOTAL", "CKMB", "CKMBINDEX", "TROPONINI" in the last 168  hours.  BNP: Invalid input(s): "POCBNP"  CBG: No results for input(s): "GLUCAP" in the last 168 hours.  Microbiology: Results for orders placed or performed during the hospital encounter of 06/19/23  Blood Culture (routine x 2)     Status: None (Preliminary result)   Collection Time: 06/19/23  6:57 PM   Specimen: BLOOD  Result Value Ref Range Status   Specimen Description BLOOD RIGHT [facility]  Final   Special Requests   Final    BOTTLES DRAWN AEROBIC AND ANAEROBIC Blood Culture results may not be optimal due to an inadequate volume of blood received in culture bottles   Culture   Final    NO GROWTH < 12 HOURS Performed at [facility], [address] [place], [place] [address]    Report Status PENDING  Incomplete  Blood Culture (routine x 2)     Status: None (Preliminary result)   Collection Time: 06/19/23  9:05 PM   Specimen: BLOOD  Result Value Ref Range Status   Specimen Description BLOOD RIGTH FA  Final   Special Requests   Final    BOTTLES DRAWN AEROBIC AND ANAEROBIC Blood Culture adequate volume   Culture   Final  NO GROWTH < 12 HOURS Performed at [facility], [address] [place], [place] [address]    Report Status PENDING  Incomplete    Coagulation Studies: No results for input(s): "LABPROT", "INR" in the last 72 hours.  Urinalysis: No results for input(s): "COLORURINE", "LABSPEC", "PHURINE", "GLUCOSEU", "HGBUR", "BILIRUBINUR", "KETONESUR", "PROTEINUR", "UROBILINOGEN", "NITRITE", "LEUKOCYTESUR" in the last 72 hours.  Invalid input(s): "APPERANCEUR"    Imaging: CT ABDOMEN PELVIS W CONTRAST  Result Date: 06/19/2023 CLINICAL DATA:  Lower abdominal pain EXAM: CT ABDOMEN AND PELVIS WITH CONTRAST TECHNIQUE: Multidetector CT imaging of the abdomen and pelvis was performed using the standard protocol following bolus administration of intravenous contrast. RADIATION DOSE REDUCTION: This exam was performed according to the departmental  dose-optimization program which includes automated exposure control, adjustment of the mA and/or kV according to patient size and/or use of iterative reconstruction technique. CONTRAST:  OMNIPAQUE IOHEXOL 300 MG/ML  SOLN COMPARISON:  03/29/2023 FINDINGS: Lower chest: Patchy left lower lobe opacity, suspicious for pneumonia, incompletely visualized. Trace left pleural fluid. Hepatobiliary: Liver is within normal limits. Cholelithiasis with indwelling percutaneous cholecystostomy. No intrahepatic or extrahepatic ductal dilatation. Pancreas: Within normal limits. Spleen: Within normal limits. Adrenals/Urinary Tract: Adrenal glands are within normal limits. Bilateral renal cortical atrophy with numerous cysts measuring up to 18 mm in the right lower kidney (series 2/image 35), benign (Bosniak I). No hydronephrosis. Bladder is underdistended but grossly unremarkable. Stomach/Bowel: 3.9 x 3.4 cm mass in the gastric cardia (series 2/image 16). Reportedly, the patient has known gastric varices, although the lesion favors a true submucosal mass on the current study. No evidence of bowel obstruction. Appendix is not discretely visualized. No colonic wall thickening or inflammatory changes. Vascular/Lymphatic: No evidence of abdominal aortic aneurysm. Atherosclerotic calcifications of the abdominal aorta and branch vessels, although vessels remain patent. No suspicious abdominopelvic lymphadenopathy. Reproductive: Fiducial markers along the prostate. Other: No abdominopelvic ascites. Mild presacral fluid, likely reflecting radiation changes. Musculoskeletal: Degenerative changes of the visualized thoracolumbar spine, most prominent at L3-4. IMPRESSION: 3.9 cm mass in the gastric cardia, suspicious for neoplasm. Endoscopy is suggested for further evaluation. Cholelithiasis with indwelling percutaneous cholecystostomy. Left lower lobe pneumonia, incompletely visualized. Trace left pleural fluid. Additional ancillary  findings as above. Electronically Signed   By: Charline Bills M.D.   On: 06/19/2023 23:02   CT Head Wo Contrast  Result Date: 06/19/2023 CLINICAL DATA:  Altered mental status EXAM: CT HEAD WITHOUT CONTRAST TECHNIQUE: Contiguous axial images were obtained from the base of the skull through the vertex without intravenous contrast. RADIATION DOSE REDUCTION: This exam was performed according to the departmental dose-optimization program which includes automated exposure control, adjustment of the mA and/or kV according to patient size and/or use of iterative reconstruction technique. COMPARISON:  04/12/2023 FINDINGS: Brain: There is atrophy and chronic small vessel disease changes. No acute intracranial abnormality. Specifically, no hemorrhage, hydrocephalus, mass lesion, acute infarction, or significant intracranial injury. Vascular: No hyperdense vessel or unexpected calcification. Skull: No acute calvarial abnormality. Sinuses/Orbits: No acute findings Other: None IMPRESSION: Atrophy, chronic microvascular disease. No acute intracranial abnormality. Electronically Signed   By: Charlett Nose M.D.   On: 06/19/2023 19:49   DG Chest Port 1 View  Result Date: 06/19/2023 CLINICAL DATA:  Sepsis EXAM: PORTABLE CHEST 1 VIEW COMPARISON:  04/12/2023 FINDINGS: Lungs are clear.  No pleural effusion or pneumothorax. The heart is normal in size.  Thoracic aortic atherosclerosis. Left IJ dual lumen dialysis catheter terminating in the right atrium. IMPRESSION: No acute cardiopulmonary disease. Electronically  Signed   By: Charline Bills M.D.   On: 06/19/2023 19:36     Assessment & Plan: Justin [name] is a 77 y.o.  male with end stage renal disease on hemodialysis, prostate cancer, diabetes mellitus type II, diabetes retinopathy, gout, hypertension, GIST, and atrial fibrillation who was admitted to [facility] on 06/19/2023 for Septic shock (HCC) [A41.9, R65.21]  [facility]) [facility]  LIJ permcath 107kg.   End Stage Renal Disease: on hemodialysis. Missed two treatments. With hypokalemia. NO acute indication for dialysis. Suspect edema is due to hypoalbuminemia. Schedule treatment for Monday and then resume TTS schedule. Orders prepared.   Anemia with chronic kidney disease: with recent GI bleed. Now off apixaban. Hemoglobin 8.8 with thrombocytopemia. History of GIST. Holding ESA.   Secondary Hyperparathyroidism:  - on cinacalcet at home.  - velphoro with meals  Hypotension:  - midodrine as outpatient.      LOS: 1 Ireta Pullman 12/8/202411:28 AM

## 2023-06-20 NOTE — Assessment & Plan Note (Addendum)
Completed an eight-week course of IV antibiotics during dialysis sessions.  Last seen by ID on 06/15/2023

## 2023-06-20 NOTE — ED Notes (Addendum)
Family member came up to nurse desk to ask about patient. Patient's family member requested to speak to the MD. Meriam Sprague, MD notified by Barbee Cough, RN via Secure chat.

## 2023-06-20 NOTE — Assessment & Plan Note (Addendum)
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

## 2023-06-20 NOTE — Assessment & Plan Note (Addendum)
Nephrology consulted for continuation of dialysis.  Patient missed session on 12/11 Appears clinically dry and without evidence of fluid overload

## 2023-06-20 NOTE — H&P (Signed)
History and Physical    Patient: Justin [name] QMV:[id number] DOB: October 11, 1945 DOA: 06/19/2023 DOS: the patient was seen and examined on 06/20/2023 PCP: System, Provider Not In  Patient coming from: Home  Chief Complaint:  Chief Complaint  Patient presents with   Altered Mental Status    HPI: Justin [name] is a 77 y.o. 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 Most of the history is given by his stepdaughter Beverley Fiedler at the bedside, who states that at baseline patient is very conversant and interactive however since Thanksgiving on 11/28, and especially over the last 4 days he has become increasingly more somnolent.  He missed his dialysis session on the day of arrival making his last session Thursday 12/5.Marland Kitchen  She reports that he has had a cough for the past 4 days. ED course and data review: On arrival BP 96/60 downtrending to 81/45 for which he received a dose of midodrine.  Pulse 88, Tmax 99.3.  O2 sat 92% on 4 L with no prior O2 requirement. Labs notable for WBC 11,000 with lactic acid 1.1 Hemoglobin 10.2, up from 7.8 at discharge a couple weeks prior and platelets 140,000 LFTs unremarkable Potassium 3.3. 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 Endoscopy is  suggested for further evaluation.  Cholelithiasis with indwelling percutaneous cholecystostomy.  Left lower lobe pneumonia, incompletely visualized. Trace left pleural fluid.  Patient started on sepsis fluids cefepime and vancomycin. 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) 12/07/2019   Dialysis patient [facility])    ESRD (end stage renal disease) on dialysis (HCC)    Gout    Hypertension    Hypotension 06/13/2022   Right posterior capsular opacification 02/27/2021   Septic shock (HCC)    Vitreous hemorrhage of right eye (HCC) 10/24/2019   Past Surgical History:  Procedure Laterality Date   A/V FISTULAGRAM Left 08/17/2017   Procedure: A/V FISTULAGRAM;  Surgeon: Renford Dills, MD;  Location: [facility] INVASIVE CV LAB;  Service: Cardiovascular;  Laterality: Left;   A/V SHUNT INTERVENTION N/A 08/17/2017   Procedure: A/V SHUNT INTERVENTION;  Surgeon: Renford Dills, MD;  Location: [facility] INVASIVE CV LAB;  Service: Cardiovascular;  Laterality: N/A;   AV FISTULA PLACEMENT Left 05/31/2015   Procedure: ARTERIOVENOUS (AV) FISTULA CREATION;  Surgeon: Renford Dills, MD;  Location: [facility] ORS;  Service: Vascular;  Laterality: Left;   COLONOSCOPY N/A 12/17/2022   Procedure: COLONOSCOPY;  Surgeon: Napoleon Form, MD;  Location: [facility] ENDOSCOPY;  Service: Gastroenterology;  Laterality: N/A;   COLONOSCOPY WITH PROPOFOL N/A 09/11/2022   Procedure: COLONOSCOPY WITH PROPOFOL;  Surgeon: Imogene Burn, MD;  Location: [facility] ENDOSCOPY;  Service: Gastroenterology;  Laterality: N/A;   COLONOSCOPY WITH PROPOFOL  N/A 09/17/2022   Procedure: COLONOSCOPY WITH PROPOFOL;  Surgeon: Benancio Deeds, MD;  Location: [facility] ENDOSCOPY;  Service: Gastroenterology;  Laterality: N/A;   ESOPHAGOGASTRODUODENOSCOPY (EGD) WITH PROPOFOL N/A 09/11/2022   Procedure: ESOPHAGOGASTRODUODENOSCOPY (EGD) WITH PROPOFOL;  Surgeon: Imogene Burn, MD;  Location: [facility] ENDOSCOPY;  Service: Gastroenterology;  Laterality: N/A;   ESOPHAGOGASTRODUODENOSCOPY (EGD) WITH PROPOFOL N/A 12/17/2022   Procedure: ESOPHAGOGASTRODUODENOSCOPY (EGD) WITH PROPOFOL;  Surgeon: Napoleon Form, MD;  Location: [facility] ENDOSCOPY;  Service: Gastroenterology;  Laterality: N/A;   excision bone spurs Bilateral 1989   feet   GIVENS CAPSULE STUDY N/A 11/10/2022   Procedure: GIVENS CAPSULE STUDY;  Surgeon: Shellia Cleverly, DO;  Location: [facility] ENDOSCOPY;  Service: Gastroenterology;  Laterality: N/A;   HEMOSTASIS CLIP PLACEMENT  09/11/2022   Procedure: HEMOSTASIS CLIP PLACEMENT;  Surgeon: Imogene Burn, MD;  Location: [facility] ENDOSCOPY;  Service: Gastroenterology;;   HEMOSTASIS CLIP PLACEMENT  09/17/2022   Procedure: HEMOSTASIS CLIP PLACEMENT;  Surgeon: Benancio Deeds, MD;  Location: [facility] ENDOSCOPY;  Service: Gastroenterology;;   HEMOSTASIS CLIP PLACEMENT  12/17/2022   Procedure: HEMOSTASIS CLIP PLACEMENT;  Surgeon: Napoleon Form, MD;  Location: [facility] ENDOSCOPY;  Service: Gastroenterology;;   HOT HEMOSTASIS N/A 09/11/2022   Procedure: HOT HEMOSTASIS (ARGON PLASMA COAGULATION/BICAP);  Surgeon: Imogene Burn, MD;  Location: [facility] ENDOSCOPY;  Service: Gastroenterology;  Laterality: N/A;   HOT HEMOSTASIS N/A 09/17/2022   Procedure: HOT HEMOSTASIS (ARGON PLASMA COAGULATION/BICAP);  Surgeon: Benancio Deeds, MD;  Location: [facility] ENDOSCOPY;  Service: Gastroenterology;  Laterality: N/A;   INCISION AND DRAINAGE PERIRECTAL ABSCESS N/A 06/15/2022   Procedure: IRRIGATION AND DEBRIDEMENT PERIRECTAL ABSCESS;  Surgeon: Franky Macho, MD;  Location: [facility] ORS;  Service: General;  Laterality: N/A;    IR EXCHANGE BILIARY DRAIN  03/16/2023   IR EXCHANGE BILIARY DRAIN  05/06/2023   IR FLUORO GUIDE CV LINE LEFT  04/19/2023   IR REMOVAL TUN CV CATH W/O FL  04/16/2023   IR US GUIDE VASC ACCESS LEFT  04/20/2023   IR US GUIDE VASC ACCESS RIGHT  04/19/2023   IR VENO/JUGULAR RIGHT  04/19/2023   KNEE SURGERY Left 1998   arthroscopy   POLYPECTOMY  09/11/2022   Procedure: POLYPECTOMY;  Surgeon: Imogene Burn, MD;  Location: [facility] ENDOSCOPY;  Service: Gastroenterology;;   SHOULDER SURGERY Left 1994   rotator cuff   Social History:  reports that he quit smoking about 44 years ago. His smoking use included cigarettes. He has never used smokeless tobacco. He reports that he does not drink alcohol and does not use drugs.  No Known Allergies  Family History  Problem Relation Age of Onset   Kidney disease Mother    Alcoholism Father    Alcoholism Brother    Colon cancer Neg Hx    Stomach cancer Neg Hx    Esophageal cancer Neg Hx     Prior to Admission medications   Medication Sig Start Date End Date Taking? Authorizing Provider  acetaminophen (TYLENOL) 325 MG tablet Take 2 tablets (650 mg total) by mouth every 6 (six) hours as needed for mild pain (or Fever >/= 101). 11/24/22   Pokhrel, Rebekah Chesterfield, MD  amiodarone (PACERONE) 200 MG tablet Take 1 tablet (200 mg total) by mouth daily. 04/09/23  Burnadette Pop, MD  atorvastatin (LIPITOR) 40 MG tablet Take 40 mg by mouth at bedtime. 10/30/14   [provider]  calcitRIOL (ROCALTROL) 0.25 MCG capsule Take 3 capsules (0.75 mcg total) by mouth every Monday, Wednesday, and Friday with hemodialysis. Patient taking differently: Take 0.25 mcg by mouth daily. 11/25/22   Pokhrel, Rebekah Chesterfield, MD  cefTAZidime 1 g in sodium chloride 0.9 % 100 mL Inject 1 g into the vein every Monday, Wednesday, and Friday with hemodialysis. 04/21/23 07/20/23  Willeen Niece, MD  midodrine (PROAMATINE) 5 MG tablet Take 2 tablets (10 mg total) by mouth 3 (three) times daily with meals. 06/05/23    Tyrone Nine, MD  omeprazole (PRILOSEC) 20 MG capsule Take 20 mg by mouth daily.    [provider]  pantoprazole (PROTONIX) 40 MG tablet Take 1 tablet (40 mg total) by mouth 2 (two) times daily before a meal. Patient not taking: Reported on 06/14/2023 11/24/22   Pokhrel, Rebekah Chesterfield, MD  PERCOCET 5-325 MG tablet Take 1 tablet by mouth every 8 (eight) hours as needed. 06/05/23   Tyrone Nine, MD  pregabalin (LYRICA) 75 MG capsule Take 1 capsule (75 mg total) by mouth 2 (two) times daily. 06/05/23   Tyrone Nine, MD    Physical Exam: Vitals:   06/19/23 2250 06/19/23 2300 06/19/23 2330 06/19/23 2359  BP:  (!) 90/50 (!) 87/56 124/73  Pulse:  85 85 85  Resp:  19 20 20   Temp: 99.3 F (37.4 C)     TempSrc: Axillary     SpO2:  94% 92% 97%  Weight:      Height:       Physical Exam Vitals and nursing note reviewed.  Constitutional:      General: He is not in acute distress.    Appearance: He is underweight.     Comments: Arouses to shaking but will readily fall back asleep  HENT:     Head: Normocephalic and atraumatic.     Mouth/Throat:     Mouth: Mucous membranes are dry.  Cardiovascular:     Rate and Rhythm: Normal rate and regular rhythm.     Heart sounds: Normal heart sounds.  Pulmonary:     Effort: Pulmonary effort is normal.     Breath sounds: Normal breath sounds.  Abdominal:     Palpations: Abdomen is soft.     Tenderness: There is no abdominal tenderness.     Comments: Cholecystostomy with yellowish-green sludgy liquid, about 150 mL in bag  Neurological:     General: No focal deficit present.     Mental Status: He is lethargic.     Labs on Admission: I have personally reviewed following labs and imaging studies  CBC: Recent Labs  Lab 06/19/23 1856  WBC 11.1*  NEUTROABS 8.9*  HGB 10.2*  HCT 34.3*  MCV 99.7  PLT 140*   Basic Metabolic Panel: Recent Labs  Lab 06/19/23 1856  NA 140  K 3.3*  CL 99  CO2 30  GLUCOSE 87  BUN 35*  CREATININE 5.61*   CALCIUM 10.2   GFR: Estimated Creatinine Clearance: 10.2 mL/min (A) (by C-G formula based on SCr of 5.61 mg/dL (H)). Liver Function Tests: Recent Labs  Lab 06/19/23 1856  AST 23  ALT 13  ALKPHOS 67  BILITOT 1.0  PROT 8.2*  ALBUMIN 2.5*   No results for input(s): "LIPASE", "AMYLASE" in the last 168 hours. No results for input(s): "AMMONIA" in the last 168 hours. Coagulation Profile: No  results for input(s): "INR", "PROTIME" in the last 168 hours. Cardiac Enzymes: No results for input(s): "CKTOTAL", "CKMB", "CKMBINDEX", "TROPONINI" in the last 168 hours. BNP (last 3 results) No results for input(s): "PROBNP" in the last 8760 hours. HbA1C: No results for input(s): "HGBA1C" in the last 72 hours. CBG: No results for input(s): "GLUCAP" in the last 168 hours. Lipid Profile: No results for input(s): "CHOL", "HDL", "LDLCALC", "TRIG", "CHOLHDL", "LDLDIRECT" in the last 72 hours. Thyroid Function Tests: No results for input(s): "TSH", "T4TOTAL", "FREET4", "T3FREE", "THYROIDAB" in the last 72 hours. Anemia Panel: No results for input(s): "VITAMINB12", "FOLATE", "FERRITIN", "TIBC", "IRON", "RETICCTPCT" in the last 72 hours. Urine analysis:    Component Value Date/Time   COLORURINE AMBER (A) 05/27/2020 1451   APPEARANCEUR CLOUDY (A) 05/27/2020 1451   LABSPEC 1.017 05/27/2020 1451   PHURINE 6.0 05/27/2020 1451   GLUCOSEU 50 (A) 05/27/2020 1451   HGBUR LARGE (A) 05/27/2020 1451   BILIRUBINUR NEGATIVE 05/27/2020 1451   KETONESUR NEGATIVE 05/27/2020 1451   PROTEINUR 100 (A) 05/27/2020 1451   UROBILINOGEN 0.2 08/02/2010 1534   NITRITE NEGATIVE 05/27/2020 1451   LEUKOCYTESUR SMALL (A) 05/27/2020 1451    Radiological Exams on Admission: CT ABDOMEN PELVIS W CONTRAST  Result Date: 06/19/2023 CLINICAL DATA:  Lower abdominal pain EXAM: CT ABDOMEN AND PELVIS WITH CONTRAST TECHNIQUE: Multidetector CT imaging of the abdomen and pelvis was performed using the standard protocol following  bolus administration of intravenous contrast. RADIATION DOSE REDUCTION: This exam was performed according to the departmental dose-optimization program which includes automated exposure control, adjustment of the mA and/or kV according to patient size and/or use of iterative reconstruction technique. CONTRAST:  OMNIPAQUE IOHEXOL 300 MG/ML  SOLN COMPARISON:  03/29/2023 FINDINGS: Lower chest: Patchy left lower lobe opacity, suspicious for pneumonia, incompletely visualized. Trace left pleural fluid. Hepatobiliary: Liver is within normal limits. Cholelithiasis with indwelling percutaneous cholecystostomy. No intrahepatic or extrahepatic ductal dilatation. Pancreas: Within normal limits. Spleen: Within normal limits. Adrenals/Urinary Tract: Adrenal glands are within normal limits. Bilateral renal cortical atrophy with numerous cysts measuring up to 18 mm in the right lower kidney (series 2/image 35), benign (Bosniak I). No hydronephrosis. Bladder is underdistended but grossly unremarkable. Stomach/Bowel: 3.9 x 3.4 cm mass in the gastric cardia (series 2/image 16). Reportedly, the patient has known gastric varices, although the lesion favors a true submucosal mass on the current study. No evidence of bowel obstruction. Appendix is not discretely visualized. No colonic wall thickening or inflammatory changes. Vascular/Lymphatic: No evidence of abdominal aortic aneurysm. Atherosclerotic calcifications of the abdominal aorta and branch vessels, although vessels remain patent. No suspicious abdominopelvic lymphadenopathy. Reproductive: Fiducial markers along the prostate. Other: No abdominopelvic ascites. Mild presacral fluid, likely reflecting radiation changes. Musculoskeletal: Degenerative changes of the visualized thoracolumbar spine, most prominent at L3-4. IMPRESSION: 3.9 cm mass in the gastric cardia, suspicious for neoplasm. Endoscopy is suggested for further evaluation. Cholelithiasis with indwelling  percutaneous cholecystostomy. Left lower lobe pneumonia, incompletely visualized. Trace left pleural fluid. Additional ancillary findings as above. Electronically Signed   By: Charline Bills M.D.   On: 06/19/2023 23:02   CT Head Wo Contrast  Result Date: 06/19/2023 CLINICAL DATA:  Altered mental status EXAM: CT HEAD WITHOUT CONTRAST TECHNIQUE: Contiguous axial images were obtained from the base of the skull through the vertex without intravenous contrast. RADIATION DOSE REDUCTION: This exam was performed according to the departmental dose-optimization program which includes automated exposure control, adjustment of the mA and/or kV according to patient size and/or use of  iterative reconstruction technique. COMPARISON:  04/12/2023 FINDINGS: Brain: There is atrophy and chronic small vessel disease changes. No acute intracranial abnormality. Specifically, no hemorrhage, hydrocephalus, mass lesion, acute infarction, or significant intracranial injury. Vascular: No hyperdense vessel or unexpected calcification. Skull: No acute calvarial abnormality. Sinuses/Orbits: No acute findings Other: None IMPRESSION: Atrophy, chronic microvascular disease. No acute intracranial abnormality. Electronically Signed   By: Charlett Nose M.D.   On: 06/19/2023 19:49   DG Chest Port 1 View  Result Date: 06/19/2023 CLINICAL DATA:  Sepsis EXAM: PORTABLE CHEST 1 VIEW COMPARISON:  04/12/2023 FINDINGS: Lungs are clear.  No pleural effusion or pneumothorax. The heart is normal in size.  Thoracic aortic atherosclerosis. Left IJ dual lumen dialysis catheter terminating in the right atrium. IMPRESSION: No acute cardiopulmonary disease. Electronically Signed   By: Charline Bills M.D.   On: 06/19/2023 19:36     Data Reviewed: Relevant notes from primary care and specialist visits, past discharge summaries as available in EHR, including Care Everywhere. Prior diagnostic testing as pertinent to current admission diagnoses Updated  medications and problem lists for reconciliation ED course, including vitals, labs, imaging, treatment and response to treatment Triage notes, nursing and pharmacy notes and ED provider's notes Notable results as noted in HPI   Assessment and Plan: * Severe sepsis (HCC) Healthcare acquired pneumonia Presence of Biliary drain (exchanged 10/24) and tunneled RIJ dialysis catheter (placed 10/7) History of Klebsiella bacteremia 04/2023 History of ischial osteomyelitis 04/2023 s/p Fortaz Sepsis criteria include hypertension, low-grade temp tachycardia, hypotension and relative hypoxia with leukocytosis and lung infiltrate on CT Left lower lobe pneumonia seen on CT abdomen and pelvis Patient has a biliary drain and dialysis catheter which can also be potential sources of infection Cefepime and vancomycin Sepsis fluids Monitor for fluid overload given dialysis session with missed session on the day of arrival  Follow cultures  History of Klebsiella bacteremia 04/15/2023 Received Elita Quick during HD for 6 weeks.  Last received 12/3 Follows with ID  Chronic hypotension Persistently low blood pressures in the ED in part related to sepsis Received midodrine with only transient effect IV fluid resuscitation for suspected sepsis May need pressors-step daughter agreeable  Acute metabolic encephalopathy Secondary to sepsis CT head nonacute Will keep n.p.o. 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  Will hold dose tonight due to n.p.o. status No longer on Eliquis since GI bleed 05/2023  ESRD on hemodialysis [facility]) Nephrology consulted for continuation of dialysis.  Patient missed session on 12/11 Appears clinically dry and without evidence of fluid overload  DM (diabetes mellitus) (HCC) Sliding scale insulin coverage  Protein-calorie malnutrition, severe (HCC) Frailty-nonambulant at baseline Patient with 2 fairly recent prolonged hospitalizations in October and November 2024 Patient cachectic with temporal muscle wasting and prominence of bony eminences Dietary consult  History of prostate cancer No acute issues suspected  History of ischial osteomyelitis s/p treatment (HCC) Completed an eight-week course of IV antibiotics during dialysis sessions.  Last seen by ID on 06/15/2023    DVT prophylaxis: heparin  Consults: renal, Dr Wynelle Link  Advance  Care Planning:   Code Status: Prior   Family Communication: Stepdaughter Beverley Fiedler at bedside  Disposition Plan: Back to previous home environment  Severity of Illness: The appropriate patient status for this patient is INPATIENT. Inpatient status is judged to be reasonable and necessary in order to provide the required intensity of service to ensure the patient's safety. The patient's presenting symptoms, physical exam findings, and initial radiographic and laboratory data in the context of their chronic comorbidities is felt to place them at high risk for further clinical deterioration. Furthermore, it is not anticipated that the patient will be medically stable for discharge from the hospital within 2 midnights of admission.   * I certify that at the point of admission it is my clinical judgment that the patient will require inpatient hospital care spanning beyond 2 midnights from the point of admission due to high intensity of service, high risk for further deterioration and high  frequency of surveillance required.*  Author: Andris Baumann, MD 06/20/2023 12:30 AM  For on call review [email or web address].

## 2023-06-20 NOTE — Assessment & Plan Note (Signed)
Persistently low blood pressures in the ED in part related to sepsis Received midodrine with only transient effect IV fluid resuscitation for suspected sepsis May need pressors-step daughter agreeable

## 2023-06-20 NOTE — Assessment & Plan Note (Signed)
Sliding scale insulin coverage 

## 2023-06-20 NOTE — Assessment & Plan Note (Signed)
Currently on amiodarone.  Will hold dose tonight due to n.p.o. status No longer on Eliquis since GI bleed 05/2023

## 2023-06-20 NOTE — Assessment & Plan Note (Signed)
Gastric varices, with history of bleed Gist on EGD 12/2022 Bleeding not suspected Hemoglobin 10.2 up from 7.8 a couple weeks prior DVT prophylaxis with heparin but will monitor H&H

## 2023-06-20 NOTE — ED Notes (Signed)
Provider in hallway asking if BP is correct for pt writer RN advise not primary and willing to assist in room @ bs Presenter, broadcasting check bp cuff for appropriate size , placement pt stretcher is placed in trendelenburg position for possible hypotension as reported by provider pt BP is cycled by monitor in room 10 primary RN Feliz Beam approaching pt room and receives information regarding current pt status per provider in room 10 writer RN returns to ED

## 2023-06-20 NOTE — ED Notes (Addendum)
Pt refused to let this RN straight stick him for morning labs. Labs were drawn off of an existing IV line. There was a significant drop in the hemoglobin levels from previous labs. It is suspected that specimen was diluted.  Lab was contacted to come draw pt's blood. The NP was made aware. NP explained to pt the need for a straight stick lab and pt still refused a straight stick blood draw.

## 2023-06-21 DIAGNOSIS — C49A0 Gastrointestinal stromal tumor, unspecified site: Secondary | ICD-10-CM

## 2023-06-21 DIAGNOSIS — L8990 Pressure ulcer of unspecified site, unspecified stage: Secondary | ICD-10-CM | POA: Insufficient documentation

## 2023-06-21 DIAGNOSIS — K319 Disease of stomach and duodenum, unspecified: Secondary | ICD-10-CM

## 2023-06-21 DIAGNOSIS — R6520 Severe sepsis without septic shock: Secondary | ICD-10-CM | POA: Diagnosis not present

## 2023-06-21 DIAGNOSIS — A419 Sepsis, unspecified organism: Secondary | ICD-10-CM | POA: Diagnosis not present

## 2023-06-21 LAB — CBC WITH DIFFERENTIAL/PLATELET
Abs Immature Granulocytes: 0.14 10*3/uL — ABNORMAL HIGH (ref 0.00–0.07)
Basophils Absolute: 0 10*3/uL (ref 0.0–0.1)
Basophils Relative: 0 %
Eosinophils Absolute: 0.1 10*3/uL (ref 0.0–0.5)
Eosinophils Relative: 1 %
HCT: 27.1 % — ABNORMAL LOW (ref 39.0–52.0)
Hemoglobin: 8.4 g/dL — ABNORMAL LOW (ref 13.0–17.0)
Immature Granulocytes: 2 %
Lymphocytes Relative: 11 %
Lymphs Abs: 0.9 10*3/uL (ref 0.7–4.0)
MCH: 30.7 pg (ref 26.0–34.0)
MCHC: 31 g/dL (ref 30.0–36.0)
MCV: 98.9 fL (ref 80.0–100.0)
Monocytes Absolute: 0.5 10*3/uL (ref 0.1–1.0)
Monocytes Relative: 6 %
Neutro Abs: 6.5 10*3/uL (ref 1.7–7.7)
Neutrophils Relative %: 80 %
Platelets: 155 10*3/uL (ref 150–400)
RBC: 2.74 MIL/uL — ABNORMAL LOW (ref 4.22–5.81)
RDW: 20.2 % — ABNORMAL HIGH (ref 11.5–15.5)
WBC: 8.1 10*3/uL (ref 4.0–10.5)
nRBC: 0 % (ref 0.0–0.2)

## 2023-06-21 LAB — BASIC METABOLIC PANEL
Anion gap: 10 (ref 5–15)
BUN: 45 mg/dL — ABNORMAL HIGH (ref 8–23)
CO2: 23 mmol/L (ref 22–32)
Calcium: 10.2 mg/dL (ref 8.9–10.3)
Chloride: 107 mmol/L (ref 98–111)
Creatinine, Ser: 6.94 mg/dL — ABNORMAL HIGH (ref 0.61–1.24)
GFR, Estimated: 7 mL/min — ABNORMAL LOW (ref >60)
Glucose, Bld: 125 mg/dL — ABNORMAL HIGH (ref 70–99)
Potassium: 3.7 mmol/L (ref 3.5–5.1)
Sodium: 140 mmol/L (ref 135–145)

## 2023-06-21 LAB — GLUCOSE, CAPILLARY
Glucose-Capillary: 58 mg/dL — ABNORMAL LOW (ref 70–99)
Glucose-Capillary: 68 mg/dL — ABNORMAL LOW (ref 70–99)
Glucose-Capillary: 127 mg/dL — ABNORMAL HIGH (ref 70–99)
Glucose-Capillary: 126 mg/dL — ABNORMAL HIGH (ref 70–99)
Glucose-Capillary: 107 mg/dL — ABNORMAL HIGH (ref 70–99)
Glucose-Capillary: 88 mg/dL (ref 70–99)
Glucose-Capillary: 91 mg/dL (ref 70–99)
Glucose-Capillary: 97 mg/dL (ref 70–99)

## 2023-06-21 MED ORDER — GLUCOSE 40 % PO GEL
1.0000 | Freq: Once | ORAL | Status: AC
  Administered 2023-06-21: 31 g via ORAL

## 2023-06-21 MED ORDER — HEPARIN SODIUM (PORCINE) 1000 UNIT/ML IJ SOLN
INTRAMUSCULAR | Status: AC
  Filled 2023-06-21: qty 10

## 2023-06-21 MED ORDER — VITAMIN C 500 MG PO TABS
500.0000 mg | ORAL_TABLET | Freq: Two times a day (BID) | ORAL | Status: DC
  Administered 2023-06-21 – 2023-06-24 (×3): 500 mg via ORAL
  Filled 2023-06-21 (×5): qty 1

## 2023-06-21 MED ORDER — DEXTROSE 50 % IV SOLN
50.0000 mL | Freq: Once | INTRAVENOUS | Status: AC
  Administered 2023-06-21: 50 mL via INTRAVENOUS
  Filled 2023-06-21: qty 50

## 2023-06-21 MED ORDER — ENSURE ENLIVE PO LIQD
237.0000 mL | Freq: Three times a day (TID) | ORAL | Status: DC
  Administered 2023-06-22: 237 mL via ORAL

## 2023-06-21 MED ORDER — RENA-VITE PO TABS
1.0000 | ORAL_TABLET | Freq: Every day | ORAL | Status: DC
  Administered 2023-06-21: 1
  Filled 2023-06-21: qty 1

## 2023-06-21 NOTE — Progress Notes (Signed)
[facility]  ROUNDING NOTE   Subjective:   Patient quite agitated this a.m. Had an episode of hypoglycemia as well. He is planned for HD today and has missed 2 treatments as an outpatient.   Objective:  Vital signs in last 24 hours:  Temp:  [96.2 F (35.7 C)-98.2 F (36.8 C)] 98.2 F (36.8 C) (12/09 0400) Pulse Rate:  [33-85] 66 (12/09 0700) Resp:  [12-24] 15 (12/09 0700) BP: (83-137)/(45-77) 119/56 (12/09 0700) SpO2:  [93 %-100 %] 100 % (12/09 0700) Weight:  [62.7 kg] 62.7 kg (12/08 1425)  Weight change: -5.113 kg Filed Weights   06/19/23 1845 06/20/23 1425  Weight: 67.8 kg 62.7 kg    Intake/Output: I/O last 3 completed shifts: In: 692 [I.V.:592; IV Piggyback:100] Out: 250 [Drains:250]   Intake/Output this shift:  No intake/output data recorded.  Physical Exam: General: No acute distress  Head: Normocephalic, atraumatic. Moist oral mucosal membranes  Neck: Supple  Lungs:  Clear to auscultation, normal effort  Heart: S1S2 no rubs  Abdomen:  Soft, nontender, bowel sounds present  Extremities: Trace peripheral edema.  Neurologic: Awake, alert, following commands  Skin: No acute rash  Access: Left IJ PermCath    Basic Metabolic Panel: Recent Labs  Lab 06/19/23 1856 06/20/23 0520  NA 140 138  K 3.3* 2.9*  CL 99 102  CO2 30 26  GLUCOSE 87 91  BUN 35* 35*  CREATININE 5.61* 5.80*  CALCIUM 10.2 9.6    Liver Function Tests: Recent Labs  Lab 06/19/23 1856 06/20/23 0520  AST 23 22  ALT 13 13  ALKPHOS 67 54  BILITOT 1.0 1.0  PROT 8.2* 6.5  ALBUMIN 2.5* 2.0*   No results for input(s): "LIPASE", "AMYLASE" in the last 168 hours. No results for input(s): "AMMONIA" in the last 168 hours.  CBC: Recent Labs  Lab 06/19/23 1856 06/20/23 0520  WBC 11.1* 12.3*  NEUTROABS 8.9*  --   HGB 10.2* 8.8*  HCT 34.3* 29.5*  MCV 99.7 99.7  PLT 140* 122*    Cardiac Enzymes: No results for input(s): "CKTOTAL", "CKMB", "CKMBINDEX", "TROPONINI" in  the last 168 hours.  BNP: Invalid input(s): "POCBNP"  CBG: Recent Labs  Lab 06/20/23 1833 06/20/23 1858 06/20/23 2019 06/20/23 2151 06/21/23 0718  GLUCAP 59* 80 73 130* 58*    Microbiology: Results for orders placed or performed during the hospital encounter of 06/19/23  Blood Culture (routine x 2)     Status: None (Preliminary result)   Collection Time: 06/19/23  6:57 PM   Specimen: BLOOD  Result Value Ref Range Status   Specimen Description BLOOD RIGHT [facility]  Final   Special Requests   Final    BOTTLES DRAWN AEROBIC AND ANAEROBIC Blood Culture results may not be optimal due to an inadequate volume of blood received in culture bottles   Culture   Final    NO GROWTH 2 DAYS Performed at [facility], [address] [place], [place] [address]    Report Status PENDING  Incomplete  Blood Culture (routine x 2)     Status: None (Preliminary result)   Collection Time: 06/19/23  9:05 PM   Specimen: BLOOD  Result Value Ref Range Status   Specimen Description BLOOD RIGTH FA  Final   Special Requests   Final    BOTTLES DRAWN AEROBIC AND ANAEROBIC Blood Culture adequate volume   Culture   Final    NO GROWTH 2 DAYS Performed at [facility], [address] [place],  [place] [address]    Report Status PENDING  Incomplete  MRSA Next Gen by PCR, Nasal     Status: None   Collection Time: 06/20/23  7:55 PM   Specimen: Nasal Mucosa; Nasal Swab  Result Value Ref Range Status   MRSA by PCR Next Gen NOT DETECTED NOT DETECTED Final    Comment: (NOTE) The GeneXpert MRSA Assay (FDA approved for NASAL specimens only), is one component of a comprehensive MRSA colonization surveillance program. It is not intended to diagnose MRSA infection nor to guide or monitor treatment for MRSA infections. Test performance is not FDA approved in patients less than 50 years old. Performed at [facility], [address] [place], [place] [address]     Coagulation  Studies: No results for input(s): "LABPROT", "INR" in the last 72 hours.  Urinalysis: No results for input(s): "COLORURINE", "LABSPEC", "PHURINE", "GLUCOSEU", "HGBUR", "BILIRUBINUR", "KETONESUR", "PROTEINUR", "UROBILINOGEN", "NITRITE", "LEUKOCYTESUR" in the last 72 hours.  Invalid input(s): "APPERANCEUR"    Imaging: CT ABDOMEN PELVIS W CONTRAST  Result Date: 06/19/2023 CLINICAL DATA:  Lower abdominal pain EXAM: CT ABDOMEN AND PELVIS WITH CONTRAST TECHNIQUE: Multidetector CT imaging of the abdomen and pelvis was performed using the standard protocol following bolus administration of intravenous contrast. RADIATION DOSE REDUCTION: This exam was performed according to the departmental dose-optimization program which includes automated exposure control, adjustment of the mA and/or kV according to patient size and/or use of iterative reconstruction technique. CONTRAST:  OMNIPAQUE IOHEXOL 300 MG/ML  SOLN COMPARISON:  03/29/2023 FINDINGS: Lower chest: Patchy left lower lobe opacity, suspicious for pneumonia, incompletely visualized. Trace left pleural fluid. Hepatobiliary: Liver is within normal limits. Cholelithiasis with indwelling percutaneous cholecystostomy. No intrahepatic or extrahepatic ductal dilatation. Pancreas: Within normal limits. Spleen: Within normal limits. Adrenals/Urinary Tract: Adrenal glands are within normal limits. Bilateral renal cortical atrophy with numerous cysts measuring up to 18 mm in the right lower kidney (series 2/image 35), benign (Bosniak I). No hydronephrosis. Bladder is underdistended but grossly unremarkable. Stomach/Bowel: 3.9 x 3.4 cm mass in the gastric cardia (series 2/image 16). Reportedly, the patient has known gastric varices, although the lesion favors a true submucosal mass on the current study. No evidence of bowel obstruction. Appendix is not discretely visualized. No colonic wall thickening or inflammatory changes. Vascular/Lymphatic: No evidence of  abdominal aortic aneurysm. Atherosclerotic calcifications of the abdominal aorta and branch vessels, although vessels remain patent. No suspicious abdominopelvic lymphadenopathy. Reproductive: Fiducial markers along the prostate. Other: No abdominopelvic ascites. Mild presacral fluid, likely reflecting radiation changes. Musculoskeletal: Degenerative changes of the visualized thoracolumbar spine, most prominent at L3-4. IMPRESSION: 3.9 cm mass in the gastric cardia, suspicious for neoplasm. Endoscopy is suggested for further evaluation. Cholelithiasis with indwelling percutaneous cholecystostomy. Left lower lobe pneumonia, incompletely visualized. Trace left pleural fluid. Additional ancillary findings as above. Electronically Signed   By: Charline Bills M.D.   On: 06/19/2023 23:02   CT Head Wo Contrast  Result Date: 06/19/2023 CLINICAL DATA:  Altered mental status EXAM: CT HEAD WITHOUT CONTRAST TECHNIQUE: Contiguous axial images were obtained from the base of the skull through the vertex without intravenous contrast. RADIATION DOSE REDUCTION: This exam was performed according to the departmental dose-optimization program which includes automated exposure control, adjustment of the mA and/or kV according to patient size and/or use of iterative reconstruction technique. COMPARISON:  04/12/2023 FINDINGS: Brain: There is atrophy and chronic small vessel disease changes. No acute intracranial abnormality. Specifically, no hemorrhage, hydrocephalus, mass lesion, acute infarction, or significant intracranial injury. Vascular: No  hyperdense vessel or unexpected calcification. Skull: No acute calvarial abnormality. Sinuses/Orbits: No acute findings Other: None IMPRESSION: Atrophy, chronic microvascular disease. No acute intracranial abnormality. Electronically Signed   By: Charlett Nose M.D.   On: 06/19/2023 19:49   DG Chest Port 1 View  Result Date: 06/19/2023 CLINICAL DATA:  Sepsis EXAM: PORTABLE CHEST 1 VIEW  COMPARISON:  04/12/2023 FINDINGS: Lungs are clear.  No pleural effusion or pneumothorax. The heart is normal in size.  Thoracic aortic atherosclerosis. Left IJ dual lumen dialysis catheter terminating in the right atrium. IMPRESSION: No acute cardiopulmonary disease. Electronically Signed   By: Charline Bills M.D.   On: 06/19/2023 19:36     Medications:    ceFEPime (MAXIPIME) IV 1 g (06/20/23 2343)   [START ON 06/22/2023] vancomycin     vancomycin      Chlorhexidine Gluconate Cloth  6 each Topical Q0600   dextrose  1 Tube Oral STAT   heparin  5,000 Units Subcutaneous Q8H   insulin aspart  0-5 Units Subcutaneous QHS   insulin aspart  0-9 Units Subcutaneous TID WC   midodrine  5 mg Oral TID WC   acetaminophen **OR** acetaminophen  Assessment/ Plan:  77 y.o. male with end stage renal disease on hemodialysis, prostate cancer, diabetes mellitus type II, diabetes retinopathy, gout, hypertension, anemia of chronic kidney disease, secondary hyperparathyroidism, history of gastric mass, chronic hypotension who was admitted with healthcare acquired pneumonia.  [facility]/TTS/left IJ PermCath/107 kg  1.  ESRD on HD TTS.  Patient missed 2 treatments prior to admission.  We are planning for hemodialysis treatment today and then plan to get him back on his regular schedule tomorrow.  2.  Anemia with chronic kidney disease.  Has known gastric mass.  Hold off on erythropoietin stimulating agents.  3.  Secondary hyperparathyroidism.  Was on Velphoro and Cinacalcet at home.  These are currently on hold.  Monitor bone metabolism parameters over the course of hospitalization.  4.  Hypotension.  Patient maintained on midodrine 5 mg 3 times daily at the moment.   LOS: 2 Particia Strahm 12/9/20247:44 AM

## 2023-06-21 NOTE — Progress Notes (Signed)
Initial Nutrition Assessment  DOCUMENTATION CODES:   Severe malnutrition in context of chronic illness  INTERVENTION:   Ensure Enlive po TID, each supplement provides 350 kcal and 20 grams of protein.  Magic cup TID with meals, each supplement provides 290 kcal and 9 grams of protein  Rena-vit po daily   Vitamin C 500mg  po BID  Liberalize diet   Pt at high refeed risk; recommend monitor potassium, magnesium and phosphorus labs daily until stable  Daily weights   NUTRITION DIAGNOSIS:   Severe Malnutrition related to chronic illness as evidenced by 38 percent weight loss in 9 months, severe fat depletion, severe muscle depletion.  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  Met with pt in room today. Pt is lethargic and does not really provide much history. Pt reports "yes" when asked if he has eaten anything today. RN reports that patient ate a few bites of an omelet for breakfast this morning. Per chart, pt has had numerous admissions and ED visits since March. Pt is down 83lbs(38%) over the past 9 months; this is severe weight loss. RD suspects pt with poor oral intake at baseline. RD will add supplements and vitamins to help pt meet his estimated needs and to support wound healing. Pt is at high refeed  risk.   Medications reviewed and include: heparin, insulin, midodrine, cefepime   Labs reviewed: K 2.9(L), BUN 35(H), creat 5.80(H) Wbc- 12.3(H), Hgb 8.8(L), Hct 29.5(L) Cbgs- 126, 127, 68, 58 x 24hrs  AIC 3.8(L)- 12/8  NUTRITION - FOCUSED PHYSICAL EXAM:  Flowsheet Row Most Recent Value  Orbital Region Moderate depletion  Upper Arm Region Severe depletion  Thoracic and Lumbar Region Severe depletion  Buccal Region Moderate depletion  Temple Region Severe depletion  Clavicle Bone Region Severe depletion  Clavicle and Acromion Bone Region Severe depletion  Scapular Bone Region Severe depletion  Dorsal Hand Severe depletion  Patellar Region Severe depletion  Anterior Thigh Region Severe depletion  Posterior Calf Region Severe depletion  Edema (RD Assessment) None  Hair Reviewed  Eyes Reviewed  Mouth Reviewed  Skin Reviewed  Nails Reviewed   Diet Order:   Diet Order             Diet renal with fluid restriction Fluid restriction: 1200 mL Fluid; Room service appropriate? Yes; Fluid consistency: Thin  Diet effective now                  EDUCATION NEEDS:   Not appropriate for education at this time  Skin:  Skin Assessment: Reviewed RN Assessment (Stage II sacrum)  Last BM:  12/8- TYPE 6  Height:   Ht Readings from Last 1 Encounters:  06/20/23 5\' 10"  (1.778 m)    Weight:   Wt Readings from Last 1 Encounters:  06/20/23 62.7 kg    Ideal Body Weight:  75.45  kg  BMI:  Body mass index is 19.83 kg/m.  Estimated Nutritional Needs:   Kcal:  1800-2100kcal/day  Protein:  90-105g/day  Fluid:  UOP +1L  Betsey Holiday MS, RD, LDN Please refer to [facility] for RD and/or RD on-call/weekend/after hours pager

## 2023-06-21 NOTE — Procedures (Signed)
Received patient in bed to unit.  Drosy. Oriented x 2. Informed consent signed and in chart.   TX duration:32mins.  Patient did not tolerated HD treatment.. Patient was about to pass out and BP drpped to 53/37. NSS bolus  given. Transported back to the room  Patient was stable and not in distress.  Hand-off given to patient's nurse.   Access used: Left upper chest HD catheter.  Access issues: NONE.  Total UF removed: 0 Medication(s) given: 0    [facility]

## 2023-06-21 NOTE — Progress Notes (Addendum)
Progress Note   Patient: Justin [name] WUJ:[id number] DOB: Dec 03, 1945 DOA: 06/19/2023     2 DOS: the patient was seen and examined on 06/21/2023   Brief hospital course: Justin [name] is a 77 y.o. 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 ED course and data review: On arrival BP 96/60 downtrending to 81/45 for which he received a dose of midodrine.   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 Patient has been seen by gastroenterologist.   Assessment and Plan:   Severe sepsis [facility]) Healthcare acquired pneumonia Presence of Biliary drain (exchanged 10/24) and tunneled RIJ dialysis catheter (placed 10/7) History of Klebsiella bacteremia 04/2023 History of ischial osteomyelitis 04/2023 s/p Fortaz Sepsis criteria include hypertension, low-grade temp tachycardia, hypotension and relative hypoxia with leukocytosis and lung infiltrate on CT Left lower lobe pneumonia seen on CT abdomen and pelvis Patient has a biliary drain and dialysis catheter which can also be potential sources of infection MRSA screen was negative [facility] pharmacist has recommended discontinuation of Vanco We will continue cefepime Monitor for fluid overload given dialysis session with missed session on the day of arrival  Follow-up on culture results and if positive we will consider catheter removal Nephrologist on board and  case discussed   History of Klebsiella bacteremia 04/15/2023 Received Elita Quick during HD for 6 weeks.  Last received 12/3 Follows with ID     Gastric mass Previous imaging as well as current CT scan of the abdomen showing findings of gastric mass Gastroenterologist was consulted Patient underwent colonoscopy/EGD in June 2024 and gastric lesion was thought to be due to GIST. Gastroenterologist planning for outpatient follow-up with EUS and biopsies once clinically stable. Patient will follow-up at [facility] where they have endoscopic ultrasound capabilities GI has signed off    Chronic hypotension Persistently low blood pressures in the ED in part related to sepsis Received midodrine with only transient effect Patient received IV fluid resuscitation for suspected sepsis Continue to monitor blood pressure closely Continue current antibiotics   Acute metabolic encephalopathy Secondary to sepsis CT head nonacute Diet resumed Continue fall and aspiration precautions   Indwelling percutaneous cholecystostomy drain present History of cholecystitis, not a surgical candidate Percutaneous drain bag, draining well, with thick yellowish-green sludgy material Last exchanged 05/06/2023 Potential source of infection Follow-up on culture results   History of GI bleed Gastric varices, 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   No longer on Eliquis since GI bleed 05/2023   ESRD on hemodialysis [facility]) Nephrologist on board for dialysis needs   DM (diabetes mellitus) (HCC) Sliding scale insulin coverage Monitor glucose closely   Protein-calorie malnutrition, severe (HCC) Frailty-nonambulant at baseline Patient with 2 fairly recent prolonged hospitalizations in October and November 2024 Patient cachectic with temporal muscle wasting and prominence of bony eminences Dietary consulted Patient with  significant weight loss with complex medical conditions  with associated poor prognosis Palliative care have been consulted for goals of care discussion   History of prostate cancer No acute issues suspected   History of ischial osteomyelitis s/p treatment (HCC) Completed 8 weeks course of antibiotics and last seen by ID on 06/15/2023     DVT prophylaxis: heparin   Consults: renal   Advance Care Planning:   Code Status: Prior    Family Communication: Stepdaughter Beverley Fiedler at bedside   Disposition Plan: Back to previous home environment     Subjective:  Patient seen and examined at bedside this morning Very lethargic Refusing labs this morning Denies nausea vomiting or abdominal pain   Physical Exam: Vitals and nursing note reviewed.  Constitutional:      General: He is not in acute distress.    Appearance: He is underweight.  HENT:     Head: Normocephalic and atraumatic.  Cardiovascular:     Rate and Rhythm: Normal rate and regular rhythm.     Heart sounds: Normal heart sounds.  Pulmonary:     Effort: Pulmonary effort is normal.     Breath sounds: Normal breath sounds.  Abdominal:     Palpations: Abdomen is soft.     Tenderness: There is no abdominal tenderness.     Comments: Cholecystostomy tube in place Neurological:     General: No focal deficit present.     Mental Status: He is lethargic.    Family Communication: Discussed with patient's daughter   Disposition: Status is: Inpatient   Time spent: 55 minutes  Data Reviewed: I reviewed gastroenterologist notes as well as previous endoscopy reports    Latest Ref Rng & Units 06/21/2023    2:21 PM 06/20/2023    5:20 AM 06/19/2023    6:56 PM  CBC  WBC 4.0 - 10.5 K/uL 8.1  12.3  11.1   Hemoglobin 13.0 - 17.0 g/dL 8.4  8.8  16.1   Hematocrit 39.0 - 52.0 % 27.1  29.5  34.3   Platelets 150 - 400 K/uL 155  122  140        Latest Ref Rng & Units 06/21/2023    2:21 PM 06/20/2023    5:20 AM 06/19/2023     6:56 PM  BMP  Glucose 70 - 99 mg/dL 096  91  87   BUN 8 - 23 mg/dL 45  35  35   Creatinine 0.61 - 1.24 mg/dL 0.45  4.09  8.11   Sodium 135 - 145 mmol/L 140  138  140   Potassium 3.5 - 5.1 mmol/L 3.7  2.9  3.3   Chloride 98 - 111 mmol/L 107  102  99   CO2 22 - 32 mmol/L 23  26  30    Calcium 8.9 - 10.3 mg/dL 91.4  9.6  78.2     Vitals:   06/21/23 1358 06/21/23 1428 06/21/23 1435 06/21/23 1458  BP: (!) 166/136 (!) 53/37 (!) 71/50 101/62  Pulse: 75   70  Resp: 19   15  Temp:      TempSrc:      SpO2: 96%   99%  Weight:      Height:          Author: Loyce Dys, MD 06/21/2023 3:39 PM  For on call review [email or web address].   Addendum at 4:30 PM I spoke with patient's son at the number listed in patient's chart who confirmed that his father had discussed this with him long ago and he is DNI DNR.  CODE STATUS has subsequently been adjusted

## 2023-06-21 NOTE — Discharge Planning (Signed)
ESTABLISHED HEMODIALYSIS Outpatient Facility  [place] [address] [place], [place] [address] [phone]  Schedule: TTS 11:30am  Dimas Chyle Dialysis Coordinator II  [facility] Cell: [phone] eFax: [phone] [email or web address]

## 2023-06-21 NOTE — Progress Notes (Addendum)
Hypoglycemic Event  CBG: 58  0718  Treatment: 1 tube glucose gel, pt refusing to take PO  Symptoms: None  Follow-up CBG: Time:0801 CBG Result:68  Possible Reasons for Event: Inadequate meal intake pts breakfast ordered   Comments/MD notified: pt willing to take [place], but refusing another CBG check. MD notified. Per MD give D50. Will administer and continue to monitor  0927:CBG 127   Rigoberto Noel

## 2023-06-21 NOTE — Evaluation (Signed)
Physical Therapy Evaluation Patient Details Name: TYRIEK SEDANO MRN: [id number] DOB: 1946-06-10 Today's Date: 06/21/2023  History of Present Illness  Patient is a 77 year old male admitted from SNF for severe sepsis/septic shock after presenting with lethargy and somnolence. Recent hospital stay of acute GI blood loss from [facility]. 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 He reports using a wheelchair for primary means of mobility at baseline.  Today assistance is required for repositioning in bed in preparation for breakfast. He is resistive to further mobility efforts with some agitation, but is cooperative with bed level activity with redirection. Recommend to continue PT to maximize independence and facilitate return to prior level of function. Anticipate patient will benefit from continued rehabilitation < 3 hours/day after this hospital stay.       If plan is discharge home, recommend the following: A lot of help with walking and/or transfers;Two people to help with bathing/dressing/bathroom;Supervision due to cognitive status;Help with stairs or ramp for entrance;Direct supervision/assist for financial management;Direct supervision/assist for medications management;Assistance with cooking/housework;Assist for transportation   Can travel by private vehicle   No    Equipment Recommendations None recommended by PT  Recommendations for Other Services       Functional Status Assessment Patient has had a recent decline in their functional status and demonstrates the ability to make significant improvements in function in a reasonable and predictable amount of time.     Precautions / Restrictions Precautions Precautions: Fall Restrictions Weight Bearing Restrictions: No       Mobility  Bed Mobility Overal bed mobility: Needs Assistance             General bed mobility comments: Max A +2 for repositioning in bed, attempting long sitting for better positioning in preparation for eating breakfast. patient resistive to mobility attempts. further mobility not assessed    Transfers                        Ambulation/Gait                  Stairs            Wheelchair Mobility     Tilt Bed    Modified Rankin (Stroke Patients Only)       Balance                                             Pertinent Vitals/Pain Pain Assessment Pain Assessment: Faces Faces Pain Scale: Hurts a little bit Pain Location: bottoms of feet Pain Descriptors / Indicators: Discomfort Pain Intervention(s): Limited activity within patient's tolerance    Home Living Family/patient expects to be discharged to:: Skilled nursing facility                        Prior Function Prior Level of Function : Needs assist             Mobility Comments: recently using sara lift for transfers at SNF (per recent notes from another hospital stay). patient reported he has not ambulated recently and is mostly in a wheelchair during the day ADLs Comments: assistance required. he  reports he can feed himself with set-up     Extremity/Trunk Assessment   Upper Extremity Assessment Upper Extremity Assessment: Generalized weakness    Lower Extremity Assessment Lower Extremity Assessment: Generalized weakness       Communication   Communication Communication: Difficulty following commands/understanding Following commands: Follows one step commands inconsistently Cueing Techniques: Verbal cues;Tactile cues;Visual cues  Cognition Arousal: Alert Behavior During Therapy: WFL for tasks assessed/performed, Agitated (resistive/agitated with mobility attempts) Overall Cognitive Status: No family/caregiver present to determine baseline  cognitive functioning                                 General Comments: Patient is disoriented to situation, time. He is oriented to name. He was abel to follow single step commands intermittently with increased time. He is agitated at times        General Comments General comments (skin integrity, edema, etc.): vitals stable throughout session.    Exercises     Assessment/Plan    PT Assessment Patient needs continued PT services  PT Problem List Decreased strength;Decreased range of motion;Decreased activity tolerance;Decreased balance;Decreased mobility;Decreased cognition;Decreased safety awareness;Decreased knowledge of precautions       PT Treatment Interventions DME instruction;Functional mobility training;Therapeutic activities;Therapeutic exercise;Balance training;Neuromuscular re-education;Cognitive remediation;Patient/family education;Wheelchair mobility training    PT Goals (Current goals can be found in the Care Plan section)  Acute Rehab PT Goals Patient Stated Goal: patient unable to participate in goal setting today PT Goal Formulation: Patient unable to participate in goal setting Time For Goal Achievement: 07/05/23 Potential to Achieve Goals: Fair    Frequency Min 1X/week     Co-evaluation               AM-PAC PT "6 Clicks" Mobility  Outcome Measure Help needed turning from your back to your side while in a flat bed without using bedrails?: A Lot Help needed moving from lying on your back to sitting on the side of a flat bed without using bedrails?: A Lot Help needed moving to and from a bed to a chair (including a wheelchair)?: Total Help needed standing up from a chair using your arms (e.g., wheelchair or bedside chair)?: Total Help needed to walk in hospital room?: Total Help needed climbing 3-5 steps with a railing? : Total 6 Click Score: 8    End of Session Equipment Utilized During Treatment: Oxygen Activity Tolerance: Patient  limited by fatigue;Treatment limited secondary to agitation Patient left: in bed;with call bell/phone within reach;with nursing/sitter in room (set-up with breakfast and feeding himself with RUE) Nurse Communication: Mobility status (nurse present throughout session) PT Visit Diagnosis: Muscle weakness (generalized) (M62.81);Unsteadiness on feet (R26.81)    Time: 7829-5621 PT Time Calculation (min) (ACUTE ONLY): 9 min   Charges:   PT Evaluation $PT Eval Low Complexity: 1 Low   PT General Charges $$ ACUTE PT VISIT: 1 Visit         Donna Bernard, PT, MPT  [facility] 06/21/2023, 9:23 AM

## 2023-06-21 NOTE — Progress Notes (Signed)
OT Cancellation Note  Patient Details Name: EMRYS STAROSTA MRN: [id number] DOB: 1946/05/12   Cancelled Treatment:    Reason Eval/Treat Not Completed: Patient at procedure or test/ unavailable. Pt noted to be off the floor , unavailable at this time. Will continue to follow and initiate services at later date/time as pt available.   Kathie Dike, M.S. OTR/L  06/21/23, 1:34 PM  ascom [phone]

## 2023-06-21 NOTE — Plan of Care (Signed)
  Problem: Clinical Measurements: Goal: Signs and symptoms of infection will decrease Outcome: Progressing   Problem: Clinical Measurements: Goal: Signs and symptoms of infection will decrease Outcome: Progressing   Problem: Fluid Volume: Goal: Hemodynamic stability will improve Outcome: Progressing   Problem: Fluid Volume: Goal: Hemodynamic stability will improve Outcome: Progressing

## 2023-06-21 NOTE — Consult Note (Signed)
Midge Minium, MD [facility]  [address], [address] [place], [place] [address] Phone: [phone] Fax : [phone]  Consultation  Referring Provider:     Dr.Djan Primary Care Physician:  System, Provider Not In Primary Gastroenterologist:   Napoleon Form, MD        Reason for Consultation:     Gastric lesion  Date of Admission:  06/19/2023 Date of Consultation:  06/21/2023         HPI:   Justin [name] is a 77 y.o. 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  The patient was supposed to be set up for a endoscopic ultrasound.  The patient also had a colonoscopy that showed a 20 mm polyp in the cecum that was not resected due to the patient's poor health status.  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 Will arrange for outpatient follow up with EUS and biopsies once clinically stable"  Colonoscopy showed: "- One 20 mm polyp in the cecum. Resection not attempted.  - A single ( solitary) ulcer in the rectum. Clips ( MR conditional) were placed.  - A single erosion in the rectum. Clip was placed."  The patient was seen today in hemodialysis and reports weight loss and appears lethargic.  He denies any abdominal pain.  The patient was admitted this time with altered mental status with hypotension and sepsis.  The patient had a previous colonoscopy in March that showed the same cecal lesion without any sign of bleeding. The patient has had multiple upper endoscopies with the recommendations of an  endoscopic ultrasound to evaluate the gastric lesion.  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) 12/07/2019   Dialysis patient [facility])    ESRD (end stage renal disease) on dialysis (HCC)    Gout    Hypertension    Hypotension 06/13/2022   Right posterior capsular opacification 02/27/2021   Septic shock (HCC)    Vitreous hemorrhage of right eye (HCC) 10/24/2019    Past Surgical History:  Procedure Laterality Date   A/V FISTULAGRAM Left 08/17/2017   Procedure: A/V FISTULAGRAM;  Surgeon: Renford Dills, MD;  Location: [facility] INVASIVE CV LAB;  Service: Cardiovascular;  Laterality: Left;   A/V SHUNT INTERVENTION N/A 08/17/2017   Procedure: A/V SHUNT INTERVENTION;  Surgeon: Renford Dills, MD;  Location: [facility] INVASIVE CV LAB;  Service: Cardiovascular;  Laterality: N/A;   AV FISTULA PLACEMENT Left 05/31/2015   Procedure: ARTERIOVENOUS (AV) FISTULA CREATION;  Surgeon: Renford Dills, MD;  Location: [facility] ORS;  Service: Vascular;  Laterality: Left;  COLONOSCOPY N/A 12/17/2022   Procedure: COLONOSCOPY;  Surgeon: Napoleon Form, MD;  Location: [facility] ENDOSCOPY;  Service: Gastroenterology;  Laterality: N/A;   COLONOSCOPY WITH PROPOFOL N/A 09/11/2022   Procedure: COLONOSCOPY WITH PROPOFOL;  Surgeon: Imogene Burn, MD;  Location: [facility] ENDOSCOPY;  Service: Gastroenterology;  Laterality: N/A;   COLONOSCOPY WITH PROPOFOL N/A 09/17/2022   Procedure: COLONOSCOPY WITH PROPOFOL;  Surgeon: Benancio Deeds, MD;  Location: [facility] ENDOSCOPY;  Service: Gastroenterology;  Laterality:  N/A;   ESOPHAGOGASTRODUODENOSCOPY (EGD) WITH PROPOFOL N/A 09/11/2022   Procedure: ESOPHAGOGASTRODUODENOSCOPY (EGD) WITH PROPOFOL;  Surgeon: Imogene Burn, MD;  Location: [facility] ENDOSCOPY;  Service: Gastroenterology;  Laterality: N/A;   ESOPHAGOGASTRODUODENOSCOPY (EGD) WITH PROPOFOL N/A 12/17/2022   Procedure: ESOPHAGOGASTRODUODENOSCOPY (EGD) WITH PROPOFOL;  Surgeon: Napoleon Form, MD;  Location: [facility] ENDOSCOPY;  Service: Gastroenterology;  Laterality: N/A;   excision bone spurs Bilateral 1989   feet   GIVENS CAPSULE STUDY N/A 11/10/2022   Procedure: GIVENS CAPSULE STUDY;  Surgeon: Shellia Cleverly, DO;  Location: [facility] ENDOSCOPY;  Service: Gastroenterology;  Laterality: N/A;   HEMOSTASIS CLIP PLACEMENT  09/11/2022   Procedure: HEMOSTASIS CLIP PLACEMENT;  Surgeon: Imogene Burn, MD;  Location: [facility] ENDOSCOPY;  Service: Gastroenterology;;   HEMOSTASIS CLIP PLACEMENT  09/17/2022   Procedure: HEMOSTASIS CLIP PLACEMENT;  Surgeon: Benancio Deeds, MD;  Location: [facility] ENDOSCOPY;  Service: Gastroenterology;;   HEMOSTASIS CLIP PLACEMENT  12/17/2022   Procedure: HEMOSTASIS CLIP PLACEMENT;  Surgeon: Napoleon Form, MD;  Location: [facility] ENDOSCOPY;  Service: Gastroenterology;;   HOT HEMOSTASIS N/A 09/11/2022   Procedure: HOT HEMOSTASIS (ARGON PLASMA COAGULATION/BICAP);  Surgeon: Imogene Burn, MD;  Location: [facility] ENDOSCOPY;  Service: Gastroenterology;  Laterality: N/A;   HOT HEMOSTASIS N/A 09/17/2022   Procedure: HOT HEMOSTASIS (ARGON PLASMA COAGULATION/BICAP);  Surgeon: Benancio Deeds, MD;  Location: [facility] ENDOSCOPY;  Service: Gastroenterology;  Laterality: N/A;   INCISION AND DRAINAGE PERIRECTAL ABSCESS N/A 06/15/2022   Procedure: IRRIGATION AND DEBRIDEMENT PERIRECTAL ABSCESS;  Surgeon: Franky Macho, MD;  Location: [facility] ORS;  Service: General;  Laterality: N/A;   IR EXCHANGE BILIARY DRAIN  03/16/2023   IR EXCHANGE BILIARY DRAIN  05/06/2023   IR FLUORO GUIDE CV LINE LEFT  04/19/2023   IR REMOVAL TUN CV CATH W/O FL   04/16/2023   IR US GUIDE VASC ACCESS LEFT  04/20/2023   IR US GUIDE VASC ACCESS RIGHT  04/19/2023   IR VENO/JUGULAR RIGHT  04/19/2023   KNEE SURGERY Left 1998   arthroscopy   POLYPECTOMY  09/11/2022   Procedure: POLYPECTOMY;  Surgeon: Imogene Burn, MD;  Location: [facility] ENDOSCOPY;  Service: Gastroenterology;;   SHOULDER SURGERY Left 1994   rotator cuff    Prior to Admission medications   Medication Sig Start Date End Date Taking? Authorizing Provider  atorvastatin (LIPITOR) 40 MG tablet Take 40 mg by mouth at bedtime. 10/30/14  Yes [provider]  GLUCAGON, RDNA, IJ Inject 1 mg into the muscle as needed (FOR BLOOD SUGAR BELOW 60 (HYPOGLYCEMIA)).   Yes [provider]  mirtazapine (REMERON) 7.5 MG tablet Take 7.5 mg by mouth at bedtime.   Yes [provider]  multivitamin (RENA-VIT) TABS tablet Take 1 tablet by mouth daily with supper.   Yes [provider]  pregabalin (LYRICA) 75 MG capsule Take 1 capsule (75 mg total) by mouth 2 (two) times daily. 06/05/23  Yes Tyrone Nine, MD  sucroferric oxyhydroxide (VELPHORO) 500 MG chewable tablet Chew 1,000 mg by mouth 3 (three) times daily  with meals.   Yes [provider]  acetaminophen (TYLENOL) 325 MG tablet Take 2 tablets (650 mg total) by mouth every 6 (six) hours as needed for mild pain (or Fever >/= 101). Patient not taking: Reported on 06/20/2023 11/24/22   Joycelyn Das, MD  amiodarone (PACERONE) 200 MG tablet Take 1 tablet (200 mg total) by mouth daily. 04/09/23   Burnadette Pop, MD  calcitRIOL (ROCALTROL) 0.25 MCG capsule Take 3 capsules (0.75 mcg total) by mouth every Monday, Wednesday, and Friday with hemodialysis. Patient taking differently: Take 0.75 mcg by mouth as directed. Take 3 capsules (0.75 mcg) once a day on Tuesday, Thursday and Saturday. 11/25/22   Pokhrel, Rebekah Chester[place], MD  cefTAZidime 1 g in sodium chloride 0.9 % 100 mL Inject 1 g into the vein every Monday, Wednesday, and Friday with  hemodialysis. Patient not taking: Reported on 06/20/2023 04/21/23 07/20/23  Willeen Niece, MD  midodrine (PROAMATINE) 10 MG tablet Take 10 mg by mouth 3 (three) times daily.    [provider]  midodrine (PROAMATINE) 5 MG tablet Take 2 tablets (10 mg total) by mouth 3 (three) times daily with meals. Patient not taking: Reported on 06/20/2023 06/05/23   Tyrone Nine, MD  omeprazole (PRILOSEC) 20 MG capsule Take 20 mg by mouth 2 (two) times daily before a meal.    [provider]  pantoprazole (PROTONIX) 40 MG tablet Take 1 tablet (40 mg total) by mouth 2 (two) times daily before a meal. Patient not taking: Reported on 06/14/2023 11/24/22   Pokhrel, Rebekah [place], MD  PERCOCET 5-325 MG tablet Take 1 tablet by mouth every 8 (eight) hours as needed. Patient not taking: Reported on 06/20/2023 06/05/23   Tyrone Nine, MD    Family History  Problem Relation Age of Onset   Kidney disease Mother    Alcoholism Father    Alcoholism Brother    Colon cancer Neg Hx    Stomach cancer Neg Hx    Esophageal cancer Neg Hx      Social History   Tobacco Use   Smoking status: Former    Current packs/day: 0.00    Types: Cigarettes    Quit date: 10/01/1978    Years since quitting: 44.7   Smokeless tobacco: Never  Vaping Use   Vaping status: Never Used  Substance Use Topics   Alcohol use: No   Drug use: No    Allergies as of 06/19/2023   (No Known Allergies)    Review of Systems:    All systems reviewed and negative except where noted in HPI.   Physical Exam:  Vital signs in last 24 hours: Temp:  [96.2 F (35.7 C)-98.2 F (36.8 C)] 97.8 F (36.6 C) (12/09 1339) Pulse Rate:  [33-85] 61 (12/09 1339) Resp:  [13-24] 18 (12/09 1339) BP: (83-152)/(49-107) 125/76 (12/09 1339) SpO2:  [93 %-100 %] 100 % (12/09 1339) Weight:  [62.7 kg-62.9 kg] 62.9 kg (12/09 1346)   General:   Pleasant, cooperative in NAD Head:  Normocephalic and atraumatic. Eyes:   No icterus.   Conjunctiva pink.  PERRLA. Ears:  Normal auditory acuity. Neck:  Supple; no masses or thyroidomegaly Lungs: Respirations even and unlabored. Lungs clear to auscultation bilaterally.   No wheezes, crackles, or rhonchi.  Heart:  Regular rate and rhythm;  Without murmur, clicks, rubs or gallops Abdomen:  Soft, nondistended, nontender. Normal bowel sounds. No appreciable masses or hepatomegaly.  No rebound or guarding.  Rectal:  Not performed. Msk:  Symmetrical without gross deformities.  Extremities:  Without edema, cyanosis or clubbing. Neurologic: Lethargic;  grossly normal neurologically. Skin:  Intact without significant lesions or rashes. Cervical Nodes:  No significant cervical adenopathy. Psych:  Alert and cooperative. Normal affect.  LAB RESULTS: Recent Labs    06/19/23 1856 06/20/23 0520  WBC 11.1* 12.3*  HGB 10.2* 8.8*  HCT 34.3* 29.5*  PLT 140* 122*   BMET Recent Labs    06/19/23 1856 06/20/23 0520  NA 140 138  K 3.3* 2.9*  CL 99 102  CO2 30 26  GLUCOSE 87 91  BUN 35* 35*  CREATININE 5.61* 5.80*  CALCIUM 10.2 9.6   LFT Recent Labs    06/20/23 0520  PROT 6.5  ALBUMIN 2.0*  AST 22  ALT 13  ALKPHOS 54  BILITOT 1.0   PT/INR No results for input(s): "LABPROT", "INR" in the last 72 hours.  STUDIES: CT ABDOMEN PELVIS W CONTRAST  Result Date: 06/19/2023 CLINICAL DATA:  Lower abdominal pain EXAM: CT ABDOMEN AND PELVIS WITH CONTRAST TECHNIQUE: Multidetector CT imaging of the abdomen and pelvis was performed using the standard protocol following bolus administration of intravenous contrast. RADIATION DOSE REDUCTION: This exam was performed according to the departmental dose-optimization program which includes automated exposure control, adjustment of the mA and/or kV according to patient size and/or use of iterative reconstruction technique. CONTRAST:  OMNIPAQUE IOHEXOL 300 MG/ML  SOLN COMPARISON:  03/29/2023 FINDINGS: Lower chest: Patchy left lower lobe opacity, suspicious  for pneumonia, incompletely visualized. Trace left pleural fluid. Hepatobiliary: Liver is within normal limits. Cholelithiasis with indwelling percutaneous cholecystostomy. No intrahepatic or extrahepatic ductal dilatation. Pancreas: Within normal limits. Spleen: Within normal limits. Adrenals/Urinary Tract: Adrenal glands are within normal limits. Bilateral renal cortical atrophy with numerous cysts measuring up to 18 mm in the right lower kidney (series 2/image 35), benign (Bosniak I). No hydronephrosis. Bladder is underdistended but grossly unremarkable. Stomach/Bowel: 3.9 x 3.4 cm mass in the gastric cardia (series 2/image 16). Reportedly, the patient has known gastric varices, although the lesion favors a true submucosal mass on the current study. No evidence of bowel obstruction. Appendix is not discretely visualized. No colonic wall thickening or inflammatory changes. Vascular/Lymphatic: No evidence of abdominal aortic aneurysm. Atherosclerotic calcifications of the abdominal aorta and branch vessels, although vessels remain patent. No suspicious abdominopelvic lymphadenopathy. Reproductive: Fiducial markers along the prostate. Other: No abdominopelvic ascites. Mild presacral fluid, likely reflecting radiation changes. Musculoskeletal: Degenerative changes of the visualized thoracolumbar spine, most prominent at L3-4. IMPRESSION: 3.9 cm mass in the gastric cardia, suspicious for neoplasm. Endoscopy is suggested for further evaluation. Cholelithiasis with indwelling percutaneous cholecystostomy. Left lower lobe pneumonia, incompletely visualized. Trace left pleural fluid. Additional ancillary findings as above. Electronically Signed   By: Charline Bills M.D.   On: 06/19/2023 23:02   CT Head Wo Contrast  Result Date: 06/19/2023 CLINICAL DATA:  Altered mental status EXAM: CT HEAD WITHOUT CONTRAST TECHNIQUE: Contiguous axial images were obtained from the base of the skull through the vertex without  intravenous contrast. RADIATION DOSE REDUCTION: This exam was performed according to the departmental dose-optimization program which includes automated exposure control, adjustment of the mA and/or kV according to patient size and/or use of iterative reconstruction technique. COMPARISON:  04/12/2023 FINDINGS: Brain: There is atrophy and chronic small vessel disease changes. No acute intracranial abnormality. Specifically, no hemorrhage, hydrocephalus, mass lesion, acute infarction, or significant intracranial injury. Vascular: No hyperdense vessel or unexpected calcification. Skull: No acute calvarial abnormality. Sinuses/Orbits: No acute findings Other: None IMPRESSION: Atrophy, chronic  microvascular disease. No acute intracranial abnormality. Electronically Signed   By: Charlett Nose M.D.   On: 06/19/2023 19:49   DG Chest Port 1 View  Result Date: 06/19/2023 CLINICAL DATA:  Sepsis EXAM: PORTABLE CHEST 1 VIEW COMPARISON:  04/12/2023 FINDINGS: Lungs are clear.  No pleural effusion or pneumothorax. The heart is normal in size.  Thoracic aortic atherosclerosis. Left IJ dual lumen dialysis catheter terminating in the right atrium. IMPRESSION: No acute cardiopulmonary disease. Electronically Signed   By: Charline Bills M.D.   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 y/o male with who  was admitted with sepsis and altered mental status with a history of radiation proctitis and a history of heme positive stools. Stool occult blood test has no role in evaluation of anemia and is a test for colon cancer screening and is inappropriate to be used for evaluation of anemia, as a negative or positive test would not change evaluation.  In addition would be positive in somebody with radiation proctitis. The patient has had multiple upper endoscopies with the finding of the lesion consistent with a GIST.  As was recorded and all of those upper endoscopies the next test would be an endoscopic ultrasound which is not performed at this hospital.  Plan:  This patient should be set up, as previously recommended by his other GI physicians that he had seen in the past, for an endoscopic ultrasound.  That is not done at this hospital and GIST (gastrointestinal STROMAL tumor) lesions are submucosal and unlikely a source of GI bleeding.  This patient should follow-up as previously recommended at [facility] where they have endoscopic ultrasound capabilities.  Nothing further to do from a GI point of view.  I will sign off.  Please call if any further GI concerns or questions.  We would like to thank you for the opportunity to participate in the care of Justin [name].   Thank you for involving me in the care of this patient.      LOS: 2 days   Midge Minium, MD, [facility] 06/21/2023, 1:55 PM,  Pager [phone] 7am-5pm  Check AMION for 5pm -7am coverage and on weekends   Note: This dictation was prepared with Dragon dictation along with smaller phrase technology. Any transcriptional errors that result from this process are unintentional.

## 2023-06-22 DIAGNOSIS — I864 Gastric varices: Secondary | ICD-10-CM

## 2023-06-22 DIAGNOSIS — I480 Paroxysmal atrial fibrillation: Secondary | ICD-10-CM

## 2023-06-22 DIAGNOSIS — A419 Sepsis, unspecified organism: Secondary | ICD-10-CM | POA: Diagnosis not present

## 2023-06-22 DIAGNOSIS — Z87898 Personal history of other specified conditions: Secondary | ICD-10-CM

## 2023-06-22 DIAGNOSIS — Z992 Dependence on renal dialysis: Secondary | ICD-10-CM

## 2023-06-22 DIAGNOSIS — G9341 Metabolic encephalopathy: Secondary | ICD-10-CM

## 2023-06-22 DIAGNOSIS — Z960 Presence of urogenital implants: Secondary | ICD-10-CM

## 2023-06-22 DIAGNOSIS — R6520 Severe sepsis without septic shock: Secondary | ICD-10-CM | POA: Diagnosis not present

## 2023-06-22 DIAGNOSIS — N186 End stage renal disease: Secondary | ICD-10-CM

## 2023-06-22 DIAGNOSIS — J189 Pneumonia, unspecified organism: Secondary | ICD-10-CM

## 2023-06-22 DIAGNOSIS — C49A0 Gastrointestinal stromal tumor, unspecified site: Secondary | ICD-10-CM

## 2023-06-22 DIAGNOSIS — E43 Unspecified severe protein-calorie malnutrition: Secondary | ICD-10-CM

## 2023-06-22 LAB — LACTIC ACID, PLASMA
Lactic Acid, Venous: 1.7 mmol/L (ref 0.5–1.9)
Lactic Acid, Venous: 1.5 mmol/L (ref 0.5–1.9)

## 2023-06-22 LAB — BASIC METABOLIC PANEL
Anion gap: 8 (ref 5–15)
BUN: 43 mg/dL — ABNORMAL HIGH (ref 8–23)
CO2: 26 mmol/L (ref 22–32)
Calcium: 10.1 mg/dL (ref 8.9–10.3)
Chloride: 105 mmol/L (ref 98–111)
Creatinine, Ser: 6.53 mg/dL — ABNORMAL HIGH (ref 0.61–1.24)
GFR, Estimated: 8 mL/min — ABNORMAL LOW (ref >60)
Glucose, Bld: 105 mg/dL — ABNORMAL HIGH (ref 70–99)
Potassium: 3.5 mmol/L (ref 3.5–5.1)
Sodium: 139 mmol/L (ref 135–145)

## 2023-06-22 LAB — CBC WITH DIFFERENTIAL/PLATELET
Abs Immature Granulocytes: 0.04 10*3/uL (ref 0.00–0.07)
Basophils Absolute: 0 10*3/uL (ref 0.0–0.1)
Basophils Relative: 0 %
Eosinophils Absolute: 0.1 10*3/uL (ref 0.0–0.5)
Eosinophils Relative: 1 %
HCT: 27.5 % — ABNORMAL LOW (ref 39.0–52.0)
Hemoglobin: 8.3 g/dL — ABNORMAL LOW (ref 13.0–17.0)
Immature Granulocytes: 0 %
Lymphocytes Relative: 11 %
Lymphs Abs: 1 10*3/uL (ref 0.7–4.0)
MCH: 29.7 pg (ref 26.0–34.0)
MCHC: 30.2 g/dL (ref 30.0–36.0)
MCV: 98.6 fL (ref 80.0–100.0)
Monocytes Absolute: 0.5 10*3/uL (ref 0.1–1.0)
Monocytes Relative: 6 %
Neutro Abs: 7.5 10*3/uL (ref 1.7–7.7)
Neutrophils Relative %: 82 %
Platelets: 142 10*3/uL — ABNORMAL LOW (ref 150–400)
RBC: 2.79 MIL/uL — ABNORMAL LOW (ref 4.22–5.81)
RDW: 19.9 % — ABNORMAL HIGH (ref 11.5–15.5)
WBC: 9.2 10*3/uL (ref 4.0–10.5)
nRBC: 0 % (ref 0.0–0.2)

## 2023-06-22 LAB — GLUCOSE, CAPILLARY
Glucose-Capillary: 95 mg/dL (ref 70–99)
Glucose-Capillary: 94 mg/dL (ref 70–99)
Glucose-Capillary: 93 mg/dL (ref 70–99)
Glucose-Capillary: 95 mg/dL (ref 70–99)

## 2023-06-22 LAB — CORTISOL: Cortisol, Plasma: 21.8 ug/dL

## 2023-06-22 LAB — HEPATITIS B SURFACE ANTIBODY, QUANTITATIVE: Hep B S AB Quant (Post): 913 m[IU]/mL

## 2023-06-22 MED ORDER — SODIUM CHLORIDE 0.9 % IV SOLN
250.0000 mL | INTRAVENOUS | Status: AC
  Administered 2023-06-22: 250 mL via INTRAVENOUS

## 2023-06-22 MED ORDER — RENA-VITE PO TABS
1.0000 | ORAL_TABLET | Freq: Every day | ORAL | Status: DC
  Administered 2023-06-22: 1 via ORAL
  Filled 2023-06-22: qty 1

## 2023-06-22 MED ORDER — MIDODRINE HCL 5 MG PO TABS: 10.0000 mg | ORAL_TABLET | Freq: Three times a day (TID) | ORAL | Status: DC

## 2023-06-22 MED ORDER — MIDODRINE HCL 5 MG PO TABS
10.0000 mg | ORAL_TABLET | Freq: Three times a day (TID) | ORAL | Status: DC
  Administered 2023-06-22 – 2023-06-24 (×3): 10 mg via ORAL
  Filled 2023-06-22 (×5): qty 2

## 2023-06-22 MED ORDER — ALBUMIN HUMAN 25 % IV SOLN
25.0000 g | Freq: Once | INTRAVENOUS | Status: AC
  Administered 2023-06-22: 25 g via INTRAVENOUS
  Filled 2023-06-22: qty 100

## 2023-06-22 MED ORDER — METRONIDAZOLE 500 MG/100ML IV SOLN
500.0000 mg | Freq: Two times a day (BID) | INTRAVENOUS | Status: DC
  Administered 2023-06-22 – 2023-06-24 (×5): 500 mg via INTRAVENOUS
  Filled 2023-06-22 (×5): qty 100

## 2023-06-22 MED ORDER — NOREPINEPHRINE 4 MG/250ML-% IV SOLN
2.0000 ug/min | INTRAVENOUS | Status: DC
  Administered 2023-06-22: 2 ug/min via INTRAVENOUS
  Filled 2023-06-22: qty 250

## 2023-06-22 NOTE — Plan of Care (Signed)
  Problem: Clinical Measurements: Goal: Diagnostic test results will improve Outcome: Progressing   Problem: Respiratory: Goal: Ability to maintain adequate ventilation will improve Outcome: Progressing   Problem: Elimination: Goal: Will not experience complications related to bowel motility Outcome: Progressing   Problem: Nutrition: Goal: Adequate nutrition will be maintained Outcome: Not Progressing

## 2023-06-22 NOTE — Progress Notes (Signed)
Pt is hypotensive this am.BP 77/48 pulse 78, 92% r/a, RR 17. Pt asymptomatic, pt refusing to take his midodrine, Dr. Meriam Sprague notified. Per MD will start levophed.

## 2023-06-22 NOTE — Progress Notes (Signed)
VAST consult to obtain PIV. Upon arrival at bedside, patient with 2 PIVs in right anterior forearm. ICU RN reported that lower IV which had Levo infusing did not have blood return and IV watch gave a red alert. She also stated patient was complaining of his arm hurting. Proximal PIV with albumin infusing; no redness or swelling noted although ICU RN reported it was slightly difficult to flush at first.  Assessed patient's right lower arm utilizing ultrasound; proximal IV location appeared compressible and with good flow. Remainder of arm with no appropriate vessels noted for PIV placement. Pt's left arm restricted d/t old fistula in upper arm which he verbalized is no longer working. USGPIV placed in lower left anterior forearm as last resort.  Educated ICU RN that once right arm is able to rest for a while, it might be possible to start another PIV in that arm. However, if that is not possible and pt needs further access, a central line is recommended.

## 2023-06-22 NOTE — Progress Notes (Signed)
Patient not tolerate HD treatment well. Pt on HD 30 mins Bp drop to 56/44 mmhg and  became unresponsive.  Tx terminated . Dr. Cherylann Ratel notified.   06/22/23 1058  Vitals  Temp (!) 97.4 F (36.3 C)  Temp Source Axillary  BP (!) 101/57  MAP (mmHg) 65  BP Location Right Arm  BP Method Automatic  Patient Position (if appropriate) Lying  Pulse Rate 80  Pulse Rate Source Monitor  ECG Heart Rate 82  Resp 19  Post Treatment  Dialyzer Clearance Clear  Hemodialysis Intake (mL) 200 mL  Liters Processed 12  Fluid Removed (mL) 0 mL  Tolerated HD Treatment No (Comment)  Post-Hemodialysis Comments see notes.  Hemodialysis Catheter Left Subclavian  No placement date or time found.   Placed prior to admission: Yes  Orientation: Left  Access Location: Subclavian  Site Condition No complications  Blue Lumen Status Heparin locked  Red Lumen Status Heparin locked  Catheter fill solution Heparin 1000 units/ml  Catheter fill volume (Arterial) 2.1 cc  Catheter fill volume (Venous) 2.1  Dressing Type Gauze/Drain sponge;Transparent  Dressing Status Antimicrobial disc in place;Clean, Dry, Intact  Interventions Dressing changed  Drainage Description None  Dressing Change Due 06/28/23  Post treatment catheter status Capped and Clamped

## 2023-06-22 NOTE — Plan of Care (Signed)
  Problem: Fluid Volume: Goal: Hemodynamic stability will improve Outcome: Progressing   Problem: Clinical Measurements: Goal: Diagnostic test results will improve Outcome: Progressing Goal: Signs and symptoms of infection will decrease Outcome: Progressing   Problem: Respiratory: Goal: Ability to maintain adequate ventilation will improve Outcome: Progressing   Problem: Education: Goal: Knowledge of General Education information will improve Description: Including pain rating scale, medication(s)/side effects and non-pharmacologic comfort measures Outcome: Progressing   Problem: Health Behavior/Discharge Planning: Goal: Ability to manage health-related needs will improve Outcome: Progressing   Problem: Clinical Measurements: Goal: Ability to maintain clinical measurements within normal limits will improve Outcome: Progressing Goal: Will remain free from infection Outcome: Progressing Goal: Diagnostic test results will improve Outcome: Progressing Goal: Respiratory complications will improve Outcome: Progressing Goal: Cardiovascular complication will be avoided Outcome: Progressing

## 2023-06-22 NOTE — Consult Note (Signed)
NAME:  Justin [name], MRN:  [id number], DOB:  1946/04/01, LOS: 3 ADMISSION DATE:  06/19/2023, CONSULTATION DATE:  06/22/23 REFERRING MD:  Dr. Meriam Sprague, CHIEF COMPLAINT:  Hypotension   Brief Pt Description / Synopsis:  Justin [name] is a 77 y.o. 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  History of Present Illness:  Justin [name] is a 77 y.o. 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   His stepdaughter reported that ever since Thanksgiving on 11/28, he has become increasingly more somnolent, and had missed one dialysis session (last session was on 12/5).  She also reported developing cough over the last 4 days prior to presentation.  ED Course: Initial Vital Signs: BP 96/60 downtrending to 81/45 for which he received a dose of midodrine. Pulse 88, Tmax 99.3. O2 sat 92% on 4 L with no prior O2 requirement.  Significant Labs:  WBC 11,000 with lactic acid 1.1 Hemoglobin 10.2, up from 7.8 at discharge a couple weeks prior and platelets 140,000 LFTs unremarkable Potassium 3.3. Imaging Chest X-ray>>FINDINGS: Lungs are clear.  No pleural effusion or pneumothorax. The heart is normal in size.  Thoracic aortic atherosclerosis. Left IJ dual lumen dialysis catheter terminating in the right  atrium. CT Head w/o contrast>>IMPRESSION: Atrophy, chronic microvascular disease. No acute intracranial abnormality. CT Abdomen & Pelvis>>IMPRESSION: 3.9 cm mass in the gastric cardia, suspicious for neoplasm. Endoscopy is suggested for further evaluation. Cholelithiasis with indwelling percutaneous cholecystostomy, no intrahepatic or extrahepatic ductal dilatation. Left lower lobe pneumonia, incompletely visualized. Trace left pleural fluid. Medications Administered: IV fluids per Sepsis protocol, IV Cefepime and Vancomycin  Hospitalist were asked to admit for further workup and treatment of  severe sepsis.  Please see "Significant Hospital Events" section below for full detailed hospital course.   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) 12/07/2019   Dialysis patient (HCC)    ESRD (end stage renal disease) on dialysis (HCC)    Gout    Hypertension    Hypotension 06/13/2022   Right posterior capsular opacification 02/27/2021   Septic shock (HCC)    Vitreous hemorrhage of right eye (HCC) 10/24/2019    Micro Data:  12/7: Blood cultures x2>> No growth to date 12/8: MRSA PCR>>negative 12/9: Bile from cholecystomy tube>> Multiple species present none predominate (NO staph aureus, group A strep, or Pseudomonas)  Antimicrobials:   Anti-infectives (From admission, onward)    Start     Dose/Rate Route Frequency Ordered Stop   06/22/23 1200  vancomycin (VANCOREADY) IVPB 750 mg/150 mL  Status:  Discontinued        750 mg 150 mL/hr over 60 Minutes Intravenous Every T-Th-Sa (Hemodialysis)  06/20/23 0248 06/21/23 1054   06/22/23 1030  metroNIDAZOLE (FLAGYL) IVPB 500 mg        500 mg 100 mL/hr over 60 Minutes Intravenous Every 12 hours 06/22/23 0849     06/21/23 1200  vancomycin (VANCOREADY) IVPB 750 mg/150 mL  Status:  Discontinued        750 mg 150 mL/hr over 60 Minutes Intravenous  Once 06/20/23 1337 06/21/23 1054   06/21/23 0000  ceFEPIme (MAXIPIME) 1 g in sodium chloride 0.9 % 100 mL IVPB        1 g 200 mL/hr over 30 Minutes Intravenous Every 24 hours 06/20/23 0125     06/20/23 0130  vancomycin (VANCOREADY) IVPB 500 mg/100 mL        500 mg 100 mL/hr over 60 Minutes Intravenous  Once 06/20/23 0122 06/20/23 0418   06/19/23 2315  ceFEPIme (MAXIPIME) 2 g in sodium chloride 0.9 % 100 mL IVPB        2 g 200 mL/hr over 30 Minutes Intravenous  Once 06/19/23 2306 06/20/23 0048   06/19/23 2315  vancomycin (VANCOCIN) IVPB 1000 mg/200 mL premix        1,000 mg 200 mL/hr over 60 Minutes Intravenous  Once 06/19/23 2306 06/20/23 0229       Significant Hospital Events: Including procedures, antibiotic start and stop dates in addition to other pertinent events   12/8: Admitted by [facility] for severe sepsis. Nephrology consulted for ESRD and HD. 12/9: Episode of agitation and hypoglycemia.  Received HD but unable to complete full treatment due to hypotension. GI consulted, recommends outpatient Endoscopic [place] for GIST.  Code status changed to DNR/DNI. 12/10: Pt again hypotensive, intermittently refusing midodrine requiring initiation of peripheral Levophed.  PCCM consulted.  Unable to tolerate HD despite Levophed. Refusing NGT placement.  Palliative Care consulted.  Interim History / Subjective:  -Pt awake and pleasantly confused -Peripheral Levophed started due to hypotension (nursing reports he has intermittently refused his midodrine) -Appears malnourished ~ family reports he has not been eating well since his admission in October and has lost at least 20 pounds -Pt is refusing to have  NGT placed -His stepdaughter (POA) and granddaughter at bedside report they do not want him to suffer, realize he will continue to decline without adequate nutrition ~  are considering Hospice route ~ Palliative Care consulted to assist with GOC   Objective   Blood pressure (!) 79/56, pulse 76, temperature 97.8 F (36.6 C), temperature source Axillary, resp. rate 19, height 5\' 10"  (1.778 m), weight 62.9 kg, SpO2 96%.        Intake/Output Summary (Last 24 hours) at 06/22/2023 0851 Last data filed at 06/22/2023 0700 Gross per 24 hour  Intake 580 ml  Output 950 ml  Net -370 ml   Filed Weights   06/20/23 1425 06/21/23 1346 06/22/23 0444  Weight: 62.7 kg 62.9 kg 62.9 kg    Examination: General: Acute on chronically frail cachectic appearing male, laying in bed, on room air, no acute distress HENT: Atraumatic, normocephalic, neck supple, no JVD, dry mucous membranes Lungs: Clear diminished breath sounds throughout, even, nonlabored, normal effort Cardiovascular: Regular rate and rhythm, S1-S2, positive murmur Abdomen: Soft, nontender, nondistended, no guarding rebound tenderness, bowel sounds positive x 4 Extremities: Muscle wasting, no edema, bilateral feet wrapped with gauze dressing Neuro: Awake and  pleasantly confused, oriented to self, follows commands GU: Deferred  Resolved Hospital Problem list     Assessment & Plan:   #Septic Shock #Paroxsymal Atrial Fibrillation #Aortic Stenosis #Tricuspid Regurgitation PMHx: Chronic Hypotension on Midodrine Echocardiogram 11/24/22: LVEF 50-55%, mild LVH, indeterminate diastolic parameters, RV systolic function mildly reduced, RV size mildly enlarged, mild MR, moderate to severe TR, moderate AS -Continuous cardiac monitoring -Maintain MAP >65 -Cautious IV fluids given ESRD -Vasopressors as needed to maintain MAP goal -Increase Midodrine to 10 mg TID -Lactic acid is normalized (1.7 ~ 1.5) -Check Cortisol -Eliquis has been d/c since  GI Bleed in 05/2023  #Severe Sepsis #Healthcare Associated Pneumonia #Indwelling Percutaneous Cholecystostomy drain present (exchanged 04/2023) for history of Cholecystitis (not a surgical candidate) #Tunneled Right internal jugular Dialysis Catheter (placed 04/19/23) #History of Klebsiella Bacteremia 04/2023 #History of Ischial Osteomyelitis 04/2023 Completed 8 weeks course of Elita Quick and last seen by ID outpatient on 06/14/2023  -Monitor fever curve -Trend WBC's & Procalcitonin -Follow cultures as above -Continue empiric Cefepime, add Flagyl pending cultures & sensitivities ~may need to consult ID for further recommendations  #ESRD on Hemodialysis #History of Prostrate Cancer -Monitor I&O's / urinary output -Follow BMP -Ensure adequate renal perfusion -Avoid nephrotoxic agents as able -Replace electrolytes as indicated ~ Pharmacy following for assistance with electrolyte replacement -Nephrology following, appreciate input ~ HD as per Nephrology  #Anemia of Chronic Disease #History of GI bleed #Gastric varices, 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 -Monitor for S/Sx of bleeding -Trend CBC -Heparin SQ for VTE Prophylaxis  -Transfuse for Hgb <7  #Diabetes Mellitus -CBG's q4h; Target range of 140 to 180 -SSI -Follow ICU Hypo/Hyperglycemia protocol  #Severe Protein Calorie Malnutrition -Dietician consulted, appreciate input -Refusing NGT  -Palliative Care consulted  #Acute Metabolic Encephalopathy CT Head negative for acute intracranial abnormality -Treatment of metabolic derangements as outlined above -Provide supportive care -Promote normal sleep/wake cycle and family presence -Avoid sedating medications as able    Pt is critically ill with severe sepsis with septic and multiorgan failure.  Prognosis is extremely guarded, high risk for further decompensation, cardiac arrest, and death.  Given current illness superimposed on multiple chronic co morbidities and  advanced age, overall long term prognosis is extremely poor.  Pt is DNR/DNI.  Recommend transition to COMFORT MEASURES.  Palliative Care consulted to assist with ongoing GOC discussions.   Best Practice (right click and "Reselect all SmartList Selections" daily)   Diet/type: Regular consistency (see orders) DVT prophylaxis: prophylactic heparin  GI prophylaxis: N/A Lines: Dialysis Catheter Foley:  N/A Code Status:  DNR Last date of multidisciplinary goals of care discussion [12/10]  12/10: Pt's stepdaughter Beverley Fiedler [facility]) and Granddaughter Chelsae updated at bedside on plan of care.   Labs   CBC: Recent Labs  Lab 06/19/23 1856 06/20/23 0520 06/21/23 1421 06/22/23 0440  WBC 11.1* 12.3* 8.1 9.2  NEUTROABS 8.9*  --  6.5 7.5  HGB 10.2* 8.8* 8.4* 8.3*  HCT 34.3* 29.5* 27.1* 27.5*  MCV 99.7 99.7 98.9 98.6  PLT 140* 122* 155 142*    Basic Metabolic Panel: Recent Labs  Lab 06/19/23 1856 06/20/23 0520 06/21/23 1421 06/22/23 0440  NA 140 138 140 139  K 3.3* 2.9* 3.7 3.5  CL 99 102 107 105  CO2 30 26 23 26   GLUCOSE 87 91 125* 105*  BUN 35* 35* 45* 43*  CREATININE 5.61* 5.80* 6.94* 6.53*  CALCIUM 10.2 9.6 10.2 10.1   GFR: Estimated Creatinine Clearance: 8.2 mL/min (A) (by C-G formula  based on SCr of 6.53 mg/dL (H)). Recent Labs  Lab 06/19/23 1856 06/20/23 0520 06/21/23 1421 06/22/23 0440  WBC 11.1* 12.3* 8.1 9.2  LATICACIDVEN 1.1  --   --   --     Liver Function Tests: Recent Labs  Lab 06/19/23 1856 06/20/23 0520  AST 23 22  ALT 13 13  ALKPHOS 67 54  BILITOT 1.0 1.0  PROT 8.2* 6.5  ALBUMIN 2.5* 2.0*   No results for input(s): "LIPASE", "AMYLASE" in the last 168 hours. No results for input(s): "AMMONIA" in the last 168 hours.  ABG    Component Value Date/Time   PHART 7.301 (L) 11/09/2022 0259   PCO2ART 57.6 (H) 11/09/2022 0259   PO2ART 109 (H) 11/09/2022 0259   HCO3 28.4 (H) 11/09/2022 0259   TCO2 35 (H) 12/13/2022 2353   O2SAT 98 11/09/2022  0259     Coagulation Profile: No results for input(s): "INR", "PROTIME" in the last 168 hours.  Cardiac Enzymes: No results for input(s): "CKTOTAL", "CKMB", "CKMBINDEX", "TROPONINI" in the last 168 hours.  HbA1C: Hgb A1c MFr Bld  Date/Time Value Ref Range Status  06/20/2023 05:20 AM 3.8 (L) 4.8 - 5.6 % Final    Comment:    (NOTE) Pre diabetes:          5.7%-6.4%  Diabetes:              >6.4%  Glycemic control for   <7.0% adults with diabetes   12/14/2022 10:37 AM 5.1 4.8 - 5.6 % Final    Comment:    (NOTE)         Prediabetes: 5.7 - 6.4         Diabetes: >6.4         Glycemic control for adults with diabetes: <7.0     CBG: Recent Labs  Lab 06/21/23 1423 06/21/23 1639 06/21/23 1934 06/21/23 2225 06/22/23 0715  GLUCAP 107* 88 91 97 95    Review of Systems:   Unable to assess due to AMS   Past Medical History:  He,  has a past medical history of Acute respiratory failure with hypoxia and hypercapnia (HCC) (06/15/2022), Allergy, unspecified, initial encounter (10/10/2019), Anemia, Anemia in chronic kidney disease (11/20/2015), Cancer (HCC) (2009), Diabetes mellitus, 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   Surgical History:   Past Surgical History:  Procedure Laterality Date   A/V FISTULAGRAM Left 08/17/2017   Procedure: A/V FISTULAGRAM;  Surgeon: Renford Dills, MD;  Location: [facility] INVASIVE CV LAB;  Service: Cardiovascular;  Laterality: Left;   A/V SHUNT INTERVENTION N/A 08/17/2017   Procedure: A/V SHUNT INTERVENTION;  Surgeon: Renford Dills, MD;  Location: [facility] INVASIVE CV LAB;  Service: Cardiovascular;  Laterality: N/A;    AV FISTULA PLACEMENT Left 05/31/2015   Procedure: ARTERIOVENOUS (AV) FISTULA CREATION;  Surgeon: Renford Dills, MD;  Location: [facility] ORS;  Service: Vascular;  Laterality: Left;   COLONOSCOPY N/A 12/17/2022   Procedure: COLONOSCOPY;  Surgeon: Napoleon Form, MD;  Location: [facility] ENDOSCOPY;  Service: Gastroenterology;  Laterality: N/A;   COLONOSCOPY WITH PROPOFOL N/A 09/11/2022   Procedure: COLONOSCOPY WITH PROPOFOL;  Surgeon: Imogene Burn, MD;  Location: [facility] ENDOSCOPY;  Service: Gastroenterology;  Laterality: N/A;   COLONOSCOPY WITH PROPOFOL N/A 09/17/2022  Procedure: COLONOSCOPY WITH PROPOFOL;  Surgeon: Benancio Deeds, MD;  Location: [facility] ENDOSCOPY;  Service: Gastroenterology;  Laterality: N/A;   ESOPHAGOGASTRODUODENOSCOPY (EGD) WITH PROPOFOL N/A 09/11/2022   Procedure: ESOPHAGOGASTRODUODENOSCOPY (EGD) WITH PROPOFOL;  Surgeon: Imogene Burn, MD;  Location: [facility] ENDOSCOPY;  Service: Gastroenterology;  Laterality: N/A;   ESOPHAGOGASTRODUODENOSCOPY (EGD) WITH PROPOFOL N/A 12/17/2022   Procedure: ESOPHAGOGASTRODUODENOSCOPY (EGD) WITH PROPOFOL;  Surgeon: Napoleon Form, MD;  Location: [facility] ENDOSCOPY;  Service: Gastroenterology;  Laterality: N/A;   excision bone spurs Bilateral 1989   feet   GIVENS CAPSULE STUDY N/A 11/10/2022   Procedure: GIVENS CAPSULE STUDY;  Surgeon: Shellia Cleverly, DO;  Location: [facility] ENDOSCOPY;  Service: Gastroenterology;  Laterality: N/A;   HEMOSTASIS CLIP PLACEMENT  09/11/2022   Procedure: HEMOSTASIS CLIP PLACEMENT;  Surgeon: Imogene Burn, MD;  Location: [facility] ENDOSCOPY;  Service: Gastroenterology;;   HEMOSTASIS CLIP PLACEMENT  09/17/2022   Procedure: HEMOSTASIS CLIP PLACEMENT;  Surgeon: Benancio Deeds, MD;  Location: [facility] ENDOSCOPY;  Service: Gastroenterology;;   HEMOSTASIS CLIP PLACEMENT  12/17/2022   Procedure: HEMOSTASIS CLIP PLACEMENT;  Surgeon: Napoleon Form, MD;  Location: [facility] ENDOSCOPY;  Service: Gastroenterology;;   HOT HEMOSTASIS N/A 09/11/2022   Procedure: HOT  HEMOSTASIS (ARGON PLASMA COAGULATION/BICAP);  Surgeon: Imogene Burn, MD;  Location: [facility] ENDOSCOPY;  Service: Gastroenterology;  Laterality: N/A;   HOT HEMOSTASIS N/A 09/17/2022   Procedure: HOT HEMOSTASIS (ARGON PLASMA COAGULATION/BICAP);  Surgeon: Benancio Deeds, MD;  Location: [facility] ENDOSCOPY;  Service: Gastroenterology;  Laterality: N/A;   INCISION AND DRAINAGE PERIRECTAL ABSCESS N/A 06/15/2022   Procedure: IRRIGATION AND DEBRIDEMENT PERIRECTAL ABSCESS;  Surgeon: Franky Macho, MD;  Location: [facility] ORS;  Service: General;  Laterality: N/A;   IR EXCHANGE BILIARY DRAIN  03/16/2023   IR EXCHANGE BILIARY DRAIN  05/06/2023   IR FLUORO GUIDE CV LINE LEFT  04/19/2023   IR REMOVAL TUN CV CATH W/O FL  04/16/2023   IR US GUIDE VASC ACCESS LEFT  04/20/2023   IR US GUIDE VASC ACCESS RIGHT  04/19/2023   IR VENO/JUGULAR RIGHT  04/19/2023   KNEE SURGERY Left 1998   arthroscopy   POLYPECTOMY  09/11/2022   Procedure: POLYPECTOMY;  Surgeon: Imogene Burn, MD;  Location: [facility] ENDOSCOPY;  Service: Gastroenterology;;   SHOULDER SURGERY Left 1994   rotator cuff     Social History:   reports that he quit smoking about 44 years ago. His smoking use included cigarettes. He has never used smokeless tobacco. He reports that he does not drink alcohol and does not use drugs.   Family History:  His family history includes Alcoholism in his brother and father; Kidney disease in his mother. There is no history of Colon cancer, Stomach cancer, or Esophageal cancer.   Allergies No Known Allergies   Home Medications  Prior to Admission medications   Medication Sig Start Date End Date Taking? Authorizing Provider  atorvastatin (LIPITOR) 40 MG tablet Take 40 mg by mouth at bedtime. 10/30/14  Yes [provider]  GLUCAGON, RDNA, IJ Inject 1 mg into the muscle as needed (FOR BLOOD SUGAR BELOW 60 (HYPOGLYCEMIA)).   Yes [provider]  mirtazapine (REMERON) 7.5 MG tablet Take 7.5 mg by mouth at bedtime.   Yes  [provider]  multivitamin (RENA-VIT) TABS tablet Take 1 tablet by mouth daily with supper.   Yes [provider]  pregabalin (LYRICA) 75 MG capsule Take 1 capsule (75 mg total) by mouth 2 (two) times daily. 06/05/23  Yes Jarvis Newcomer,  Fulton Reek, MD  sucroferric oxyhydroxide (VELPHORO) 500 MG chewable tablet Chew 1,000 mg by mouth 3 (three) times daily with meals.   Yes [provider]  acetaminophen (TYLENOL) 325 MG tablet Take 2 tablets (650 mg total) by mouth every 6 (six) hours as needed for mild pain (or Fever >/= 101). Patient not taking: Reported on 06/20/2023 11/24/22   Joycelyn Das, MD  amiodarone (PACERONE) 200 MG tablet Take 1 tablet (200 mg total) by mouth daily. 04/09/23   Burnadette Pop, MD  calcitRIOL (ROCALTROL) 0.25 MCG capsule Take 3 capsules (0.75 mcg total) by mouth every Monday, Wednesday, and Friday with hemodialysis. Patient taking differently: Take 0.75 mcg by mouth as directed. Take 3 capsules (0.75 mcg) once a day on Tuesday, Thursday and Saturday. 11/25/22   Pokhrel, Rebekah Chesterfield, MD  cefTAZidime 1 g in sodium chloride 0.9 % 100 mL Inject 1 g into the vein every Monday, Wednesday, and Friday with hemodialysis. Patient not taking: Reported on 06/20/2023 04/21/23 07/20/23  Willeen Niece, MD  midodrine (PROAMATINE) 10 MG tablet Take 10 mg by mouth 3 (three) times daily.    [provider]  midodrine (PROAMATINE) 5 MG tablet Take 2 tablets (10 mg total) by mouth 3 (three) times daily with meals. Patient not taking: Reported on 06/20/2023 06/05/23   Tyrone Nine, MD  omeprazole (PRILOSEC) 20 MG capsule Take 20 mg by mouth 2 (two) times daily before a meal.    [provider]  pantoprazole (PROTONIX) 40 MG tablet Take 1 tablet (40 mg total) by mouth 2 (two) times daily before a meal. Patient not taking: Reported on 06/14/2023 11/24/22   Pokhrel, Rebekah Chesterfield, MD  PERCOCET 5-325 MG tablet Take 1 tablet by mouth every 8 (eight) hours as needed. Patient not  taking: Reported on 06/20/2023 06/05/23   Tyrone Nine, MD     Critical care time: 67 minutes     Harlon Ditty, AGACNP-BC [facility] Prefer epic messenger for cross cover needs If after hours, please call E-link

## 2023-06-22 NOTE — Progress Notes (Signed)
OT Cancellation Note  Patient Details Name: ALMIR KLEINKE MRN: [id number] DOB: Nov 19, 1945   Cancelled Treatment:    Reason Eval/Treat Not Completed: Medical issues which prohibited therapy. Chart reviewed. Per chart and RN pt continues to have low BP, not appropriate for OT at this time. Will hold and initiate services as pt appropriate.   Kathie Dike, M.S. OTR/L  06/22/23, 12:19 PM  ascom [phone]

## 2023-06-22 NOTE — Progress Notes (Signed)
Pt refusing cbg check

## 2023-06-22 NOTE — Progress Notes (Signed)
Pt's hypotensive during HD, BP 56/44, MAP 50 HR 82, 94% r/a, per dialysis nurse will stop treatment. Dr. Meriam Sprague and Dr. Mauri Reading aware. Per Dr. Mauri Reading will order albumin

## 2023-06-22 NOTE — Progress Notes (Signed)
[facility]  ROUNDING NOTE   Subjective:   Patient in bed lethargic this a.m. He is arousable however. Blood pressure still a bit low. He is to be started on Levophed. Dialysis to be reattempted once he is on Levophed.   Objective:  Vital signs in last 24 hours:  Temp:  [97.5 F (36.4 C)-98.2 F (36.8 C)] 97.8 F (36.6 C) (12/10 0800) Pulse Rate:  [61-83] 83 (12/10 0900) Resp:  [12-21] 19 (12/10 0900) BP: (53-166)/(37-136) 96/73 (12/10 0900) SpO2:  [92 %-100 %] 93 % (12/10 0900) Weight:  [62.9 kg] 62.9 kg (12/10 0444)  Weight change: 0.2 kg Filed Weights   06/20/23 1425 06/21/23 1346 06/22/23 0444  Weight: 62.7 kg 62.9 kg 62.9 kg    Intake/Output: I/O last 3 completed shifts: In: 680 [P.O.:480; IV Piggyback:200] Out: 1070 [Drains:470; Other:600]   Intake/Output this shift:  No intake/output data recorded.  Physical Exam: General: No acute distress  Head: Normocephalic, atraumatic. Moist oral mucosal membranes  Neck: Supple  Lungs:  Clear to auscultation, normal effort  Heart: S1S2 no rubs  Abdomen:  Soft, nontender, bowel sounds present  Extremities: Trace peripheral edema.  Neurologic: Lethargic but arousable  Skin: No acute rash  Access: Left IJ PermCath    Basic Metabolic Panel: Recent Labs  Lab 06/19/23 1856 06/20/23 0520 06/21/23 1421 06/22/23 0440  NA 140 138 140 139  K 3.3* 2.9* 3.7 3.5  CL 99 102 107 105  CO2 30 26 23 26   GLUCOSE 87 91 125* 105*  BUN 35* 35* 45* 43*  CREATININE 5.61* 5.80* 6.94* 6.53*  CALCIUM 10.2 9.6 10.2 10.1    Liver Function Tests: Recent Labs  Lab 06/19/23 1856 06/20/23 0520  AST 23 22  ALT 13 13  ALKPHOS 67 54  BILITOT 1.0 1.0  PROT 8.2* 6.5  ALBUMIN 2.5* 2.0*   No results for input(s): "LIPASE", "AMYLASE" in the last 168 hours. No results for input(s): "AMMONIA" in the last 168 hours.  CBC: Recent Labs  Lab 06/19/23 1856 06/20/23 0520 06/21/23 1421 06/22/23 0440  WBC 11.1* 12.3*  8.1 9.2  NEUTROABS 8.9*  --  6.5 7.5  HGB 10.2* 8.8* 8.4* 8.3*  HCT 34.3* 29.5* 27.1* 27.5*  MCV 99.7 99.7 98.9 98.6  PLT 140* 122* 155 142*    Cardiac Enzymes: No results for input(s): "CKTOTAL", "CKMB", "CKMBINDEX", "TROPONINI" in the last 168 hours.  BNP: Invalid input(s): "POCBNP"  CBG: Recent Labs  Lab 06/21/23 1423 06/21/23 1639 06/21/23 1934 06/21/23 2225 06/22/23 0715  GLUCAP 107* 88 91 97 95    Microbiology: Results for orders placed or performed during the hospital encounter of 06/19/23  Blood Culture (routine x 2)     Status: None (Preliminary result)   Collection Time: 06/19/23  6:57 PM   Specimen: BLOOD  Result Value Ref Range Status   Specimen Description BLOOD RIGHT [facility]  Final   Special Requests   Final    BOTTLES DRAWN AEROBIC AND ANAEROBIC Blood Culture results may not be optimal due to an inadequate volume of blood received in culture bottles   Culture   Final    NO GROWTH 3 DAYS Performed at [facility], [address] [place], [place] [address]    Report Status PENDING  Incomplete  Blood Culture (routine x 2)     Status: None (Preliminary result)   Collection Time: 06/19/23  9:05 PM   Specimen: BLOOD  Result Value Ref Range Status   Specimen Description BLOOD  RIGTH FA  Final   Special Requests   Final    BOTTLES DRAWN AEROBIC AND ANAEROBIC Blood Culture adequate volume   Culture   Final    NO GROWTH 3 DAYS Performed at [facility], [address] [place], [place] [address]    Report Status PENDING  Incomplete  MRSA Next Gen by PCR, Nasal     Status: None   Collection Time: 06/20/23  7:55 PM   Specimen: Nasal Mucosa; Nasal Swab  Result Value Ref Range Status   MRSA by PCR Next Gen NOT DETECTED NOT DETECTED Final    Comment: (NOTE) The GeneXpert MRSA Assay (FDA approved for NASAL specimens only), is one component of a comprehensive MRSA colonization surveillance program. It is not intended to diagnose MRSA infection  nor to guide or monitor treatment for MRSA infections. Test performance is not FDA approved in patients less than 57 years old. Performed at [facility], [address] [place], [place] [address]   Body fluid culture w Gram Stain     Status: None (Preliminary result)   Collection Time: 06/21/23  4:45 AM   Specimen: BILE; Body Fluid  Result Value Ref Range Status   Specimen Description   Final    BILE Performed at [facility], [address], [place], [place] [address]    Special Requests   Final    NONE Performed at [facility], [address] [place], [place] [address]    Gram Stain   Final    NO WBC SEEN FEW GRAM NEGATIVE RODS Performed at [facility], [address], [place], [place] [address]    Culture PENDING  Incomplete   Report Status PENDING  Incomplete    Coagulation Studies: No results for input(s): "LABPROT", "INR" in the last 72 hours.  Urinalysis: No results for input(s): "COLORURINE", "LABSPEC", "PHURINE", "GLUCOSEU", "HGBUR", "BILIRUBINUR", "KETONESUR", "PROTEINUR", "UROBILINOGEN", "NITRITE", "LEUKOCYTESUR" in the last 72 hours.  Invalid input(s): "APPERANCEUR"    Imaging: No results found.   Medications:    sodium chloride     ceFEPime (MAXIPIME) IV 1 g (06/21/23 2133)   metronidazole     norepinephrine (LEVOPHED) Adult infusion 2 mcg/min (06/22/23 0857)    vitamin C  500 mg Oral BID   Chlorhexidine Gluconate Cloth  6 each Topical Q0600   feeding supplement  237 mL Oral TID BM   heparin  5,000 Units Subcutaneous Q8H   midodrine  10 mg Oral TID WC   multivitamin  1 tablet Oral QHS   acetaminophen **OR** acetaminophen  Assessment/ Plan:  77 y.o. male with end stage renal disease on hemodialysis, prostate cancer, diabetes mellitus type II, diabetes retinopathy, gout, hypertension, anemia of chronic kidney disease, secondary hyperparathyroidism, history of gastric mass, chronic hypotension who was admitted  with healthcare acquired pneumonia.  [facility]/TTS/left IJ PermCath/107 kg  1.  ESRD on HD TTS.  Patient missed 2 treatments prior to admission.   -Dialysis was attempted yesterday but given blood pressure dropped it was terminated early.  Not taking midodrine at the moment due to lethargy.  Patient started on Levophed and we will start dialysis afterwards.  2.  Anemia with chronic kidney disease.  Has known gastric mass.  Hold off on erythropoietin stimulating agents. Lab Results  Component Value Date   HGB 8.3 (L) 06/22/2023    3.  Secondary hyperparathyroidism.  Was on Velphoro and Cinacalcet at home.  Continue to hold binders.  4.  Hypotension.  Patient not awake enough to take midodrine.  Therefore patient transitioned to Levophed.  6  LOS: 3 Philomina Leon 12/10/20249:16 AM

## 2023-06-22 NOTE — Progress Notes (Signed)
Progress Note   Patient: Justin [name] ZOX:[id number] DOB: September 12, 1945 DOA: 06/19/2023     3 DOS: the patient was seen and examined on 06/22/2023   Brief hospital course: MARNE AUSLEY is a 77 y.o. 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 ED course and data review: On arrival BP 96/60 downtrending to 81/45 for which he received a dose of midodrine.   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 Patient has been seen by gastroenterologist.     Assessment and Plan:   Septic shock [facility]) Healthcare acquired pneumonia Presence of Biliary drain (exchanged 10/24) and tunneled RIJ dialysis catheter (placed 10/7) History of Klebsiella bacteremia 04/2023 History of ischial osteomyelitis 04/2023 s/p Fortaz Sepsis criteria include hypertension, low-grade temp tachycardia, hypotension and relative hypoxia with leukocytosis and lung infiltrate on CT Left lower lobe pneumonia seen on CT abdomen and pelvis Patient has a biliary drain and dialysis catheter which can also be potential sources of infection MRSA screen was negative and so pharmacist has recommended discontinuation of Vanco We will continue cefepime Monitor for fluid overload given dialysis session with missed session on the day of arrival  Follow-up on culture results and if positive we will consider catheter removal Nephrologist on  board and case discussed Patient has not been able to tolerate dialysis on 2 consecutive sessions Initiated on Levophed today 06/22/2023 Intensivist consulted however they would like to remain as consulting team and [facility] remains the primary   History of Klebsiella bacteremia 04/15/2023 Received Elita Quick during HD for 6 weeks.  Last received 12/3 Follows with ID     Gastric mass Previous imaging as well as current CT scan of the abdomen showing findings of gastric mass Gastroenterologist was consulted Patient underwent colonoscopy/EGD in June 2024 and gastric lesion was thought to be due to GIST. Gastroenterologist planning for outpatient follow-up with EUS and biopsies once clinically stable. Patient will follow-up at [facility] where they have endoscopic ultrasound capabilities if able to leave the hospital GI has signed off     Chronic hypotension Now with septic shock Continue management as above   Acute metabolic encephalopathy Secondary to sepsis CT head nonacute Diet resumed Continue fall and aspiration precautions   Indwelling percutaneous cholecystostomy drain present History of cholecystitis, not a surgical candidate Percutaneous drain bag, draining well, with thick yellowish-green sludgy material Last exchanged 05/06/2023 Potential source of infection Follow-up on culture results   History of GI bleed Gastric varices, 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   No longer on Eliquis since GI bleed 05/2023   ESRD on hemodialysis [facility]) Nephrologist on board for dialysis needs   DM (diabetes mellitus) (HCC) Sliding scale insulin coverage Monitor glucose closely   Protein-calorie malnutrition, severe (HCC) Frailty-nonambulant at baseline Patient with 2 fairly recent prolonged hospitalizations in October and November 2024 Patient cachectic with temporal muscle wasting and  prominence of bony eminences Dietary consulted Patient with significant weight loss with complex medical conditions with associated poor prognosis Palliative care have been consulted for goals of care discussion   History of prostate cancer No acute issues suspected   History of ischial osteomyelitis s/p treatment (HCC) Completed 8 weeks course of antibiotics and last seen by ID on 06/15/2023     DVT prophylaxis: heparin   Consults: renal   Advance Care Planning:   Code Status: Prior    Family Communication: Stepdaughter Beverley Fiedler at bedside   Disposition Plan: Back to previous home environment     Subjective:  Patient running hypotensive  Unable to tolerate dialysis again today Patient was initiated on Levophed Intensivist consulted   Physical Exam: Vitals and nursing note reviewed.  Constitutional:      General: He is not in acute distress.    Appearance: He is underweight.  HENT:     Head: Normocephalic and atraumatic.  Cardiovascular:     Rate and Rhythm: Normal rate and regular rhythm.     Heart sounds: Normal heart sounds.  Pulmonary:     Effort: Pulmonary effort is normal.     Breath sounds: Normal breath sounds.  Abdominal:     Palpations: Abdomen is soft.     Tenderness: There is no abdominal tenderness.     Comments: Cholecystostomy tube in place Neurological:     General: No focal deficit present.     Mental Status: He is lethargic.    Family Communication: Discussed with patient's daughter   Disposition: Status is: Inpatient   Time spent: 45 minutes   Data Reviewed: I reviewed gastroenterologist notes as well as previous endoscopy reports    Latest Ref Rng & Units 06/22/2023    4:40 AM 06/21/2023    2:21 PM 06/20/2023    5:20 AM  CBC  WBC 4.0 - 10.5 K/uL 9.2  8.1  12.3   Hemoglobin 13.0 - 17.0 g/dL 8.3  8.4  8.8   Hematocrit 39.0 - 52.0 % 27.5  27.1  29.5   Platelets 150 - 400 K/uL 142  155  122        Latest Ref Rng & Units  06/22/2023    4:40 AM 06/21/2023    2:21 PM 06/20/2023    5:20 AM  BMP  Glucose 70 - 99 mg/dL 865  784  91   BUN 8 - 23 mg/dL 43  45  35   Creatinine 0.61 - 1.24 mg/dL 6.96  2.95  2.84   Sodium 135 - 145 mmol/L 139  140  138   Potassium 3.5 - 5.1 mmol/L 3.5  3.7  2.9   Chloride 98 - 111 mmol/L 105  107  102   CO2 22 - 32 mmol/L 26  23  26    Calcium 8.9 - 10.3 mg/dL 13.2  44.0  9.6     Vitals:   06/22/23 1255 06/22/23 1300 06/22/23 1400 06/22/23 1500  BP: 127/82 114/77 (!) 105/57 115/65  Pulse:      Resp: 17 17 16 18   Temp:      TempSrc:      SpO2:  100% 100% 99%  Weight:      Height:         Author: Loyce Dys, MD 06/22/2023 4:03 PM  For on call review [email or web address].

## 2023-06-23 DIAGNOSIS — I959 Hypotension, unspecified: Secondary | ICD-10-CM

## 2023-06-23 DIAGNOSIS — Z7189 Other specified counseling: Secondary | ICD-10-CM | POA: Diagnosis not present

## 2023-06-23 DIAGNOSIS — N186 End stage renal disease: Secondary | ICD-10-CM | POA: Diagnosis not present

## 2023-06-23 DIAGNOSIS — A419 Sepsis, unspecified organism: Secondary | ICD-10-CM | POA: Diagnosis not present

## 2023-06-23 DIAGNOSIS — Z8719 Personal history of other diseases of the digestive system: Secondary | ICD-10-CM

## 2023-06-23 DIAGNOSIS — R6520 Severe sepsis without septic shock: Secondary | ICD-10-CM | POA: Diagnosis not present

## 2023-06-23 DIAGNOSIS — Z515 Encounter for palliative care: Secondary | ICD-10-CM | POA: Diagnosis not present

## 2023-06-23 DIAGNOSIS — K811 Chronic cholecystitis: Secondary | ICD-10-CM

## 2023-06-23 LAB — BODY FLUID CULTURE W GRAM STAIN: Gram Stain: NONE SEEN

## 2023-06-23 LAB — GLUCOSE, CAPILLARY: Glucose-Capillary: 94 mg/dL (ref 70–99)

## 2023-06-23 NOTE — Progress Notes (Signed)
[facility]  ROUNDING NOTE   Subjective:   Patient has not tolerated the last 2 dialysis sessions very well overall. Systolic blood pressure fell into the 50s with dialysis despite use of Levophed yesterday. Still awaiting palliative care input.  Objective:  Vital signs in last 24 hours:  Temp:  [97.4 F (36.3 C)-98.4 F (36.9 C)] 98 F (36.7 C) (12/11 0400) Pulse Rate:  [37-96] 71 (12/11 0600) Resp:  [14-23] 15 (12/11 0600) BP: (56-142)/(35-120) 134/59 (12/11 0600) SpO2:  [92 %-100 %] 100 % (12/11 0600) Weight:  [63 kg] 63 kg (12/10 1013)  Weight change: 0.1 kg Filed Weights   06/21/23 1346 06/22/23 0444 06/22/23 1013  Weight: 62.9 kg 62.9 kg 63 kg    Intake/Output: I/O last 3 completed shifts: In: 636.1 [P.O.:90; I.V.:73.6; IV Piggyback:472.5] Out: 305 [Drains:305]   Intake/Output this shift:  No intake/output data recorded.  Physical Exam: General: No acute distress  Head: Normocephalic, atraumatic. Moist oral mucosal membranes  Neck: Supple  Lungs:  Clear to auscultation, normal effort  Heart: S1S2 no rubs  Abdomen:  Soft, nontender, bowel sounds present  Extremities: Trace peripheral edema.  Neurologic: Lethargic but arousable  Skin: No acute rash  Access: Left IJ PermCath    Basic Metabolic Panel: Recent Labs  Lab 06/19/23 1856 06/20/23 0520 06/21/23 1421 06/22/23 0440  NA 140 138 140 139  K 3.3* 2.9* 3.7 3.5  CL 99 102 107 105  CO2 30 26 23 26   GLUCOSE 87 91 125* 105*  BUN 35* 35* 45* 43*  CREATININE 5.61* 5.80* 6.94* 6.53*  CALCIUM 10.2 9.6 10.2 10.1    Liver Function Tests: Recent Labs  Lab 06/19/23 1856 06/20/23 0520  AST 23 22  ALT 13 13  ALKPHOS 67 54  BILITOT 1.0 1.0  PROT 8.2* 6.5  ALBUMIN 2.5* 2.0*   No results for input(s): "LIPASE", "AMYLASE" in the last 168 hours. No results for input(s): "AMMONIA" in the last 168 hours.  CBC: Recent Labs  Lab 06/19/23 1856 06/20/23 0520 06/21/23 1421 06/22/23 0440   WBC 11.1* 12.3* 8.1 9.2  NEUTROABS 8.9*  --  6.5 7.5  HGB 10.2* 8.8* 8.4* 8.3*  HCT 34.3* 29.5* 27.1* 27.5*  MCV 99.7 99.7 98.9 98.6  PLT 140* 122* 155 142*    Cardiac Enzymes: No results for input(s): "CKTOTAL", "CKMB", "CKMBINDEX", "TROPONINI" in the last 168 hours.  BNP: Invalid input(s): "POCBNP"  CBG: Recent Labs  Lab 06/21/23 2225 06/22/23 0715 06/22/23 1130 06/22/23 1749 06/22/23 2149  GLUCAP 97 95 94 93 95    Microbiology: Results for orders placed or performed during the hospital encounter of 06/19/23  Blood Culture (routine x 2)     Status: None (Preliminary result)   Collection Time: 06/19/23  6:57 PM   Specimen: BLOOD  Result Value Ref Range Status   Specimen Description BLOOD RIGHT [facility]  Final   Special Requests   Final    BOTTLES DRAWN AEROBIC AND ANAEROBIC Blood Culture results may not be optimal due to an inadequate volume of blood received in culture bottles   Culture   Final    NO GROWTH 4 DAYS Performed at [facility], [address] [place], [place] [address]    Report Status PENDING  Incomplete  Blood Culture (routine x 2)     Status: None (Preliminary result)   Collection Time: 06/19/23  9:05 PM   Specimen: BLOOD  Result Value Ref Range Status   Specimen Description BLOOD RIGTH FA  Final   Special Requests   Final    BOTTLES DRAWN AEROBIC AND ANAEROBIC Blood Culture adequate volume   Culture   Final    NO GROWTH 4 DAYS Performed at [facility], [address] [place], [place] [address]    Report Status PENDING  Incomplete  MRSA Next Gen by PCR, Nasal     Status: None   Collection Time: 06/20/23  7:55 PM   Specimen: Nasal Mucosa; Nasal Swab  Result Value Ref Range Status   MRSA by PCR Next Gen NOT DETECTED NOT DETECTED Final    Comment: (NOTE) The GeneXpert MRSA Assay (FDA approved for NASAL specimens only), is one component of a comprehensive MRSA colonization surveillance program. It is not intended to diagnose  MRSA infection nor to guide or monitor treatment for MRSA infections. Test performance is not FDA approved in patients less than 67 years old. Performed at [facility], [address] [place], [place] [address]   Body fluid culture w Gram Stain     Status: Abnormal (Preliminary result)   Collection Time: 06/21/23  4:45 AM   Specimen: BILE; Body Fluid  Result Value Ref Range Status   Specimen Description   Final    BILE Performed at [facility], [address] [place], [place] [address]    Special Requests   Final    NONE Performed at [facility], [address] [place], [place] [address]    Gram Stain NO WBC SEEN FEW GRAM NEGATIVE RODS   Final   Culture (A)  Final    MULTIPLE ORGANISMS PRESENT, NONE PREDOMINANT NO STAPHYLOCOCCUS AUREUS ISOLATED NO GROUP A STREP (S.PYOGENES) ISOLATED No Pseudomonas species isolated Performed at [facility], [address] [place], [place] [address]    Report Status PENDING  Incomplete    Coagulation Studies: No results for input(s): "LABPROT", "INR" in the last 72 hours.  Urinalysis: No results for input(s): "COLORURINE", "LABSPEC", "PHURINE", "GLUCOSEU", "HGBUR", "BILIRUBINUR", "KETONESUR", "PROTEINUR", "UROBILINOGEN", "NITRITE", "LEUKOCYTESUR" in the last 72 hours.  Invalid input(s): "APPERANCEUR"    Imaging: No results found.   Medications:    sodium chloride Stopped (06/22/23 1640)   ceFEPime (MAXIPIME) IV Stopped (06/22/23 2207)   metronidazole Stopped (06/22/23 2331)   norepinephrine (LEVOPHED) Adult infusion Stopped (06/22/23 1254)    vitamin C  500 mg Oral BID   Chlorhexidine Gluconate Cloth  6 each Topical Q0600   feeding supplement  237 mL Oral TID BM   heparin  5,000 Units Subcutaneous Q8H   midodrine  10 mg Oral TID WC   multivitamin  1 tablet Oral QHS   acetaminophen **OR** acetaminophen  Assessment/ Plan:  77 y.o. male with end stage renal disease on hemodialysis,  prostate cancer, diabetes mellitus type II, diabetes retinopathy, gout, hypertension, anemia of chronic kidney disease, secondary hyperparathyroidism, history of gastric mass, chronic hypotension who was admitted with healthcare acquired pneumonia.  [facility]/TTS/left IJ PermCath/107 kg  1.  ESRD on HD TTS.  Patient missed 2 treatments prior to admission.   -Dialysis has been attempted the past 2 days however he developed significant hypotension with these attempts.  Yesterday his systolic blood pressure fell into the 50s despite use of Levophed.  We are hesitant to perform further dialysis at the moment.  We are still awaiting palliative care input.  2.  Anemia with chronic kidney disease.  Has known gastric mass.  Continue to hold Procrit. Lab Results  Component Value Date  HGB 8.3 (L) 06/22/2023    3.  Secondary hyperparathyroidism.  Was on Velphoro and Cinacalcet at home.  He will continue to hold phosphorus binders at the moment.  4.  Hypotension.  Despite use of Levophed the patient's blood pressure dropped significantly during dialysis treatment yesterday.  Systolic blood pressure fell into the 50s.   LOS: 4 [address] 12/11/20247:52 AM

## 2023-06-23 NOTE — Progress Notes (Addendum)
Progress Note   Patient: Justin [name]:[id number] DOB: October 27, 1945 DOA: 06/19/2023     4 DOS: the patient was seen and examined on 06/23/2023   Brief hospital course: "Justin [name] is a 77 y.o. 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 ED course and data review: On arrival BP 96/60 downtrending to 81/45 for which he received a dose of midodrine.   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 Patient has been seen by gastroenterologist." See H&P for full HPI on admission & ED course.  Further hospital course and management as outlined below.  Hospital course complicated by hypotension which has limited ability to perform hemodialysis.  Palliative Care consulted for goals of care discussions.  Given severity of acute illness and multiple underlying co-morbid conditions, and malnutrition, frail and deconditioned state, patient is very high risk for further morbidity and mortality despite all medical interventions that can be offered.  Pending clinical course in the next 24- 48 hours, may be appropriate for hospice / comfort care measures.      Assessment and Plan:   Septic shock (HCC) Healthcare acquired pneumonia Presence of Biliary drain (exchanged 10/24) and tunneled RIJ dialysis catheter (placed 10/7) History of Klebsiella bacteremia 04/2023 History of ischial osteomyelitis  04/2023 s/p Fortaz Sepsis criteria include hypertension, low-grade temp tachycardia, hypotension and relative hypoxia with leukocytosis and lung infiltrate on CT Left lower lobe pneumonia seen on CT abdomen and pelvis Patient has a biliary drain and dialysis catheter which can also be potential sources of infection MRSA screen was negative, Vancomycin dc'd Continue cefepime Monitor for fluid overload given dialysis session with missed session on the day of arrival  Follow-up on culture results and if positive we will consider catheter removal Nephrology following  Not able to tolerate dialysis multiple attempts, limited by severe hypotension Intensivist consulted for pressors Initiated on Levophed 06/22/2023 Maintain MAP > 65    History of Klebsiella bacteremia 04/15/2023 Received Elita Quick during HD for 6 weeks.  Last received 12/3 Follows with ID   Gastric mass Previous imaging as well as current CT scan of the abdomen showing findings of gastric mass Gastroenterologist was consulted Patient underwent colonoscopy/EGD in June 2024 and gastric lesion was thought to be due to GIST. Gastroenterologist planning for outpatient follow-up with EUS and biopsies once clinically stable. Patient will follow-up at [facility] where they have endoscopic ultrasound capabilities if able to leave the hospital GI has signed off   Chronic hypotension Now with septic shock PCCM consulted, Levophed, maintain MAP > 65 Continue management as above   Acute metabolic encephalopathy Secondary to sepsis CT head nonacute Diet resumed Continue fall and aspiration precautions   Indwelling percutaneous cholecystostomy drain present History of cholecystitis, not a surgical candidate Percutaneous drain bag, draining well, with thick yellowish-green sludgy material Last exchanged 05/06/2023 Potential source of infection Follow-up on culture results   History of GI bleed Gastric varices, with history of  bleed  Gist on EGD 12/2022 Bleeding not suspected Monitor CBC   Paroxysmal A-fib (HCC) Currently on amiodarone.   No longer on Eliquis since GI bleed 05/2023   ESRD on hemodialysis [facility]) Nephrologist on board for dialysis needs   DM (diabetes mellitus) (HCC) Sliding scale insulin coverage Monitor glucose closely   Protein-calorie malnutrition, severe (HCC) Frailty-nonambulant at baseline Patient with 2 fairly recent prolonged hospitalizations in October and November 2024 Patient cachectic with temporal muscle wasting and prominence of bony eminences Dietary consulted Patient with significant weight loss with complex medical conditions with associated poor prognosis Palliative care have been consulted for goals of care discussion   History of prostate cancer No acute issues suspected   History of ischial osteomyelitis s/p treatment (HCC) Completed 8 weeks course of antibiotics and last seen by ID on 06/15/2023   Multiple pressure injuries of skin I agree with the wound descriptions as outlined below See wound care orders Continue frequent repositioning, offload pressure areas Pressure Injury 06/21/23 Heel Left;Posterior Unstageable - Full thickness tissue loss in which the base of the injury is covered by slough (yellow, tan, gray, green or brown) and/or eschar (tan, brown or black) in the wound bed. (Active)  06/21/23 0700  Location: Heel  Location Orientation: Left;Posterior  Staging: Unstageable - Full thickness tissue loss in which the base of the injury is covered by slough (yellow, tan, gray, green or brown) and/or eschar (tan, brown or black) in the wound bed.  Wound Description (Comments):   Present on Admission: Yes     Pressure Injury 06/21/23 Heel Right Unstageable - Full thickness tissue loss in which the base of the injury is covered by slough (yellow, tan, gray, green or brown) and/or eschar (tan, brown or black) in the wound bed. (Active)  06/21/23 0700   Location: Heel  Location Orientation: Right  Staging: Unstageable - Full thickness tissue loss in which the base of the injury is covered by slough (yellow, tan, gray, green or brown) and/or eschar (tan, brown or black) in the wound bed.  Wound Description (Comments):   Present on Admission: Yes     Pressure Injury 06/20/23 Sacrum Mid Stage 2 -  Partial thickness loss of dermis presenting as a shallow open injury with a red, pink wound bed without slough. tiny 1/2 cm x 1/2 cm x 2 wounds. (Active)  06/20/23 1400  Location: Sacrum  Location Orientation: Mid  Staging: Stage 2 -  Partial thickness loss of dermis presenting as a shallow open injury with a red, pink wound bed without slough.  Wound Description (Comments): tiny 1/2 cm x 1/2 cm x 2 wounds.  Present on Admission: Yes         DVT prophylaxis: heparin   Consults: Nephrology, Palliative Care, PCCM   Advance Care Planning:   Code Status: Prior     Disposition Plan: SNF     Subjective:  Patient running hypotensive  Unable to tolerate dialysis again today Patient was initiated on Levophed Intensivist consulted   Physical Exam: General exam: somnolent, does not arouse to voice + sternal rub, no acute distress HEENT: atraumatic, clear conjunctiva, anicteric sclera, moist mucus membranes, hearing grossly normal  Respiratory system: CTAB, no wheezes, rales or rhonchi, normal respiratory effort. Cardiovascular system: normal S1/S2, RRR, +systolic murmur, no pedal edema.   Gastrointestinal system: soft, NT, ND, cholecystostomy tube in place Central nervous system: exam limited by somnolence Extremities: moves all , no edema, normal tone Skin: dry, intact, normal temperature Psychiatry: exam limited by  somnolence      Family Communication: None present on rounds. Will attempt to call this afternoon as time allows.   Family to meet with Palliative Care today as well.      Disposition: Status is: Inpatient   Time  spent: 45 minutes     Data Reviewed: I reviewed gastroenterologist notes as well as previous endoscopy reports    Latest Ref Rng & Units 06/22/2023    4:40 AM 06/21/2023    2:21 PM 06/20/2023    5:20 AM  CBC  WBC 4.0 - 10.5 K/uL 9.2  8.1  12.3   Hemoglobin 13.0 - 17.0 g/dL 8.3  8.4  8.8   Hematocrit 39.0 - 52.0 % 27.5  27.1  29.5   Platelets 150 - 400 K/uL 142  155  122        Latest Ref Rng & Units 06/22/2023    4:40 AM 06/21/2023    2:21 PM 06/20/2023    5:20 AM  BMP  Glucose 70 - 99 mg/dL 161  096  91   BUN 8 - 23 mg/dL 43  45  35   Creatinine 0.61 - 1.24 mg/dL 0.45  4.09  8.11   Sodium 135 - 145 mmol/L 139  140  138   Potassium 3.5 - 5.1 mmol/L 3.5  3.7  2.9   Chloride 98 - 111 mmol/L 105  107  102   CO2 22 - 32 mmol/L 26  23  26    Calcium 8.9 - 10.3 mg/dL 91.4  78.2  9.6     Vitals:   06/23/23 0300 06/23/23 0400 06/23/23 0500 06/23/23 0600  BP: 128/67 120/65 127/71 (!) 134/59  Pulse: 82 73 72 71  Resp: 19 16 14 15   Temp:  98 F (36.7 C)    TempSrc:  Axillary    SpO2: 99% 98% 100% 100%  Weight:      Height:         Author: Pennie Banter, DO 06/23/2023 3:33 PM  For on call review [email or web address].

## 2023-06-23 NOTE — Consult Note (Addendum)
Consultation Note Date: 06/23/2023   Patient Name: CARSIN GIANNINI  DOB: 04-Jul-1946  MRN: [id number]  Age / Sex: 77 y.o., male  PCP: System, Provider Not In Referring Physician: Pennie Banter, DO  Reason for Consultation:  goals of care  HPI/Patient Profile: 77 y.o. male  with past medical history of ESRD on HD, chronic hypotension, ischial osteomyelitis, DM2, HTN, HLD, a-fib- not on Eliquis due to GI bleeding/GIST (hospitalized 11/19-11/23 for significant blood loss), prostate ca, cholecystitis with biliary drain in place  admitted on 06/19/2023 with severe sepsis related to pneumonia. Recovery has been complicated by significant hypotension. He has not been tolerating his dialysis due to hypotension. His mental status is poor and currently he is declining medications, food and all interventions. Palliative medicine consulted for GOC.    Primary Decision Maker HCPOA - he had an HCPOA document on his chart indicating Lorayne Bender as his HCPOA, however, per staff discussion with another family member- Allegra Lai- Delorise Shiner is no longer HCPOA and Revonda Standard has a more recent document.  I was able to confirm this with both Revonda Standard and Delorise Shiner. HCPOA document was voided and new document requested.    Discussion: Chart reviewed including labs, progress notes, imaging from this and previous encounters.  Please see above regarding efforts made in determining appropriate decision maker.  On evaluation- patient lethargic. I attempted to ask him about surrogate decision maker, but he was unable to confirm HCPOA. He was unable to participate in GOC discussion.  I was able to speak with both Allegra Lai and Lorayne Bender and confirm that Revonda Standard is HCPOA and she is going to bring a copy of document.  Discussed patient's current poor health state with Revonda Standard. She understands he is not tolerating dialysis, he is not  eating, he is refusing medications and interventions and just wants to be left alone.  She states she does not want him to suffer.  She wishes to try and include patient's sons in decision making but notes they have not been very involved in his care.  Revonda Standard would like to meet tomorrow morning for further discussion and is going to attempt to contact patient's sons to participate as well.  She is in agreement with not doing dialysis until we have further GOC discussion.     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 He is not tolerating HD- becomes severely hypotensive and nearly unresponsive with attempts. He is declining medical interventions, not eating, poor mental status. I highly recommend transition to full comfort measures only.  -Plan to meet with patient's HCPOA- Allegra Lai tomorrow for further discussion and to relay recommendations of comfort measures only -Hold HD for now until GOC discussion completed- plan for 0930 tomorrow morning    Addendum 12/12- Revonda Standard does not have HCPOA document. Document on chart that states Emelia Loron and Lorayne Bender are HCPOA is accurate.   Code Status/Advance Care Planning: DNR  Prognosis:   Unable to determine  Discharge Planning: To Be Determined  Primary Diagnoses: Present on Admission:  Severe sepsis (HCC)  Paroxysmal A-fib (HCC)  Hypotension  Chronic hypotension  Gastric mass, suspect GIST (EGD 12/2022)  Gastric varices   Review of Systems  Unable to perform ROS   Physical Exam Vitals and nursing note reviewed.  Constitutional:      General: He is not in acute distress.    Appearance: He is ill-appearing.     Comments: Frail, cachetic  Neurological:     Comments: lethargic     Vital Signs: BP (!) 134/59   Pulse 71   Temp 98 F (36.7 C)  (Axillary)   Resp 15   Ht 5\' 10"  (1.778 m)   Wt 63 kg   SpO2 100%   BMI 19.93 kg/m  Pain Scale: 0-10   Pain Score: Asleep   SpO2: SpO2: 100 % O2 Device:SpO2: 100 % O2 Flow Rate: .O2 Flow Rate (L/min): 4 L/min  IO: Intake/output summary:  Intake/Output Summary (Last 24 hours) at 06/23/2023 1159 Last data filed at 06/23/2023 1056 Gross per 24 hour  Intake 502.38 ml  Output 155 ml  Net 347.38 ml    LBM: Last BM Date : 06/22/23 Baseline Weight: Weight: 67.8 kg Most recent weight: Weight: 63 kg       Thank you for this consult. Palliative medicine will continue to follow and assist as needed.  Time Total: 85 minutes Signed by: Ocie Bob, AGNP-C Palliative Medicine  Time includes:   Preparing to see the patient (e.g., review of tests) Obtaining and/or reviewing separately obtained history Performing a medically necessary appropriate examination and/or evaluation Counseling and educating the patient/family/caregiver Ordering medications, tests, or procedures Referring and communicating with other health care professionals (when not reported separately) Documenting clinical information in the electronic or other health record Independently interpreting results (not reported separately) and communicating results to the patient/family/caregiver Care coordination (not reported separately) Clinical documentation   Please contact Palliative Medicine Team phone at [phone] for questions and concerns.  For individual provider: See Loretha Stapler

## 2023-06-23 NOTE — Evaluation (Signed)
Occupational Therapy Evaluation Patient Details Name: Justin [name] MRN: [id number] DOB: Apr 04, 1946 Today's Date: 06/23/2023   History of Present Illness Patient is a 77 year old male admitted from SNF for severe sepsis/septic shock after presenting with lethargy and somnolence. Recent hospital stay of acute GI blood loss from GIST. 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 Prior to hospital admission, pt was requiring A for ADLs. Pt lives at a SNF. Pt presents to acute OT demonstrating impaired ADL performance and functional mobility 2/2 (See OT problem list for additional functional deficits). Upon arrival pt supine in bed, agreeable to tx with encouragement. Pt very lethargic eyes closing intermittently t/o session. Pt disoriented and confused to time and situation. Pt requested something to drink, given ensure. Pt took a couple sips with Min A. Pt completed supine>sit at the EOB with Max Ax2. Pt unable to do further mobility d/t poor sitting tolerance. Pt returned to bed and completed MMT on the RUE, unable to on the LUE d/t A-line in place. Pt demonstrated AROM with some noted weakness in grip strength and shoulder elevation only to 90 degrees. Pt left supine in bed with call bell within reach and all needs met. Pt would benefit from skilled OT services to address noted impairments and functional limitations (see below for any additional details) in order to maximize safety and independence while minimizing falls risk and caregiver burden. Anticipate the need for follow up OT services upon acute hospital DC.        If plan is discharge home, recommend the following: Assistance with cooking/housework;Assist for transportation;Two people to help with walking and/or transfers;Two people to help with bathing/dressing/bathroom;Help with stairs or ramp for entrance     Functional Status Assessment  Patient has had a recent decline in their functional status and demonstrates the ability to make significant improvements in function in a reasonable and predictable amount of time.  Equipment Recommendations  None recommended by OT    Recommendations for Other Services       Precautions / Restrictions Precautions Precautions: Fall Restrictions Weight Bearing Restrictions: No      Mobility Bed Mobility Overal bed mobility: Needs Assistance Bed Mobility: Supine to Sit, Sit to Supine     Supine to sit: Max assist, +2 for physical assistance Sit to supine: Max assist, +2 for physical assistance        Transfers                          Balance Overall balance assessment: Needs assistance Sitting-balance support: Feet supported, No upper extremity supported Sitting balance-Leahy Scale: Poor                                     ADL either performed or assessed with clinical judgement   ADL Overall ADL's : Needs assistance/impaired                                     Functional mobility during ADLs: Maximal assistance;+2 for physical assistance;Cueing for sequencing;Cueing for safety General ADL Comments: Pt very lethargic eye closing intermittently t/o session. Pt requested something to drink, given ensure. Pt took a couple sips with min  A. Pt completed supine>sit at the EOB with Max Ax2. Pt unable to do further mobility d/t poor sitting tolerance. Pt returned to bed and completed MMT on the RUE, unable to on the LUE d/t A-line in place. Pt demonstrated AROM with some noted weakness in grip strength and shoulder elevation only to 90 degrees.     Vision         Perception         Praxis         Pertinent Vitals/Pain Pain Assessment Pain Assessment: Faces Faces Pain Scale: Hurts a little bit Pain Location: bottoms of feet Pain Descriptors / Indicators: Discomfort Pain Intervention(s):  Limited activity within patient's tolerance     Extremity/Trunk Assessment Upper Extremity Assessment Upper Extremity Assessment: Generalized weakness   Lower Extremity Assessment Lower Extremity Assessment: Generalized weakness       Communication Communication Communication: Difficulty following commands/understanding Following commands: Follows one step commands inconsistently Cueing Techniques: Verbal cues;Tactile cues;Visual cues   Cognition Arousal: Lethargic Behavior During Therapy: Flat affect Overall Cognitive Status: No family/caregiver present to determine baseline cognitive functioning                                 General Comments: Patient is disoriented to situation, time. He is oriented to name. Pt intermittently with eye closed t/o session     General Comments       Exercises     Shoulder Instructions      Home Living Family/patient expects to be discharged to:: Skilled nursing facility Living Arrangements: Other (Comment) (SNF)                           Home Equipment: Rolling Walker (2 wheels);Cane - single point;Wheelchair - manual;BSC/3in1;Shower seat          Prior Functioning/Environment Prior Level of Function : Needs assist             Mobility Comments: recently using sara lift for transfers at SNF (per recent notes from another hospital stay). patient reported he has not ambulated recently and is mostly in a wheelchair during the day ADLs Comments: assistance required. he reports he can feed himself with set-up        OT Problem List: Decreased strength;Decreased range of motion;Decreased coordination;Decreased activity tolerance;Decreased safety awareness;Impaired balance (sitting and/or standing);Decreased knowledge of precautions      OT Treatment/Interventions: Self-care/ADL training;Therapeutic exercise;Therapeutic activities;Energy conservation;DME and/or AE instruction;Patient/family education     OT Goals(Current goals can be found in the care plan section) Acute Rehab OT Goals Patient Stated Goal: to get better OT Goal Formulation: With patient Time For Goal Achievement: 07/07/23 Potential to Achieve Goals: Good ADL Goals Pt Will Perform Grooming: sitting;with supervision Pt Will Perform Lower Body Dressing: sitting/lateral leans;sit to/from stand;with supervision Pt Will Transfer to Toilet: bedside commode;with min assist (Will tolerated t/f in sara steady)  OT Frequency: Min 1X/week    Co-evaluation              AM-PAC OT "6 Clicks" Daily Activity     Outcome Measure Help from another person eating meals?: A Little Help from another person taking care of personal grooming?: A Lot Help from another person toileting, which includes using toliet, bedpan, or urinal?: A Lot Help from another person bathing (including washing, rinsing, drying)?: Total Help from another person to put on and taking off regular upper  body clothing?: A Lot Help from another person to put on and taking off regular lower body clothing?: Total 6 Click Score: 11   End of Session Nurse Communication: Mobility status  Activity Tolerance: Patient tolerated treatment well Patient left: in bed;with call bell/phone within reach;with bed alarm set  OT Visit Diagnosis: Unsteadiness on feet (R26.81);Other abnormalities of gait and mobility (R26.89);Muscle weakness (generalized) (M62.81)                Time: 1610-9604 OT Time Calculation (min): 14 min Charges:  OT General Charges $OT Visit: 1 Visit OT Evaluation $OT Eval Low Complexity: 1 Low  Butch Penny, SOT

## 2023-06-24 DIAGNOSIS — I9589 Other hypotension: Secondary | ICD-10-CM

## 2023-06-24 DIAGNOSIS — Z515 Encounter for palliative care: Secondary | ICD-10-CM | POA: Diagnosis not present

## 2023-06-24 DIAGNOSIS — A419 Sepsis, unspecified organism: Secondary | ICD-10-CM | POA: Diagnosis not present

## 2023-06-24 DIAGNOSIS — R627 Adult failure to thrive: Secondary | ICD-10-CM

## 2023-06-24 DIAGNOSIS — G9341 Metabolic encephalopathy: Secondary | ICD-10-CM | POA: Diagnosis not present

## 2023-06-24 DIAGNOSIS — R6520 Severe sepsis without septic shock: Secondary | ICD-10-CM | POA: Diagnosis not present

## 2023-06-24 LAB — GLUCOSE, CAPILLARY
Glucose-Capillary: 88 mg/dL (ref 70–99)
Glucose-Capillary: 67 mg/dL — ABNORMAL LOW (ref 70–99)
Glucose-Capillary: 142 mg/dL — ABNORMAL HIGH (ref 70–99)

## 2023-06-24 LAB — CULTURE, BLOOD (ROUTINE X 2)
Culture: NO GROWTH
Culture: NO GROWTH
Special Requests: ADEQUATE

## 2023-06-24 MED ORDER — ONDANSETRON 4 MG PO TBDP: 4.0000 mg | ORAL_TABLET | Freq: Four times a day (QID) | ORAL | Status: DC | PRN

## 2023-06-24 MED ORDER — ORAL CARE MOUTH RINSE: 15.0000 mL | OROMUCOSAL | Status: DC | PRN

## 2023-06-24 MED ORDER — HYDROMORPHONE HCL 1 MG/ML IJ SOLN: 0.5000 mg | INTRAMUSCULAR | Status: DC | PRN

## 2023-06-24 MED ORDER — LORAZEPAM 2 MG/ML IJ SOLN: 1.0000 mg | INTRAMUSCULAR | Status: DC | PRN

## 2023-06-24 MED ORDER — DEXTROSE 50 % IV SOLN
1.0000 | Freq: Once | INTRAVENOUS | Status: AC
  Administered 2023-06-24: 50 mL via INTRAVENOUS
  Filled 2023-06-24: qty 50

## 2023-06-24 MED ORDER — GLYCOPYRROLATE 1 MG PO TABS: 1.0000 mg | ORAL_TABLET | ORAL | Status: DC | PRN

## 2023-06-24 MED ORDER — GLYCOPYRROLATE 0.2 MG/ML IJ SOLN
0.2000 mg | INTRAMUSCULAR | Status: DC | PRN
  Filled 2023-06-24: qty 1

## 2023-06-24 MED ORDER — LORAZEPAM 1 MG PO TABS: 1.0000 mg | ORAL_TABLET | ORAL | Status: DC | PRN

## 2023-06-24 MED ORDER — HALOPERIDOL 0.5 MG PO TABS: 0.5000 mg | ORAL_TABLET | ORAL | Status: DC | PRN

## 2023-06-24 MED ORDER — LORAZEPAM 2 MG/ML PO CONC: 1.0000 mg | ORAL | Status: DC | PRN

## 2023-06-24 MED ORDER — HALOPERIDOL LACTATE 5 MG/ML IJ SOLN: 0.5000 mg | INTRAMUSCULAR | Status: DC | PRN

## 2023-06-24 MED ORDER — GLYCOPYRROLATE 0.2 MG/ML IJ SOLN
0.2000 mg | INTRAMUSCULAR | Status: DC | PRN
  Administered 2023-06-25 – 2023-06-27 (×2): 0.2 mg via INTRAVENOUS
  Filled 2023-06-24: qty 1

## 2023-06-24 MED ORDER — ONDANSETRON HCL 4 MG/2ML IJ SOLN: 4.0000 mg | Freq: Four times a day (QID) | INTRAMUSCULAR | Status: DC | PRN

## 2023-06-24 MED ORDER — HALOPERIDOL LACTATE 2 MG/ML PO CONC: 0.5000 mg | ORAL | Status: DC | PRN

## 2023-06-24 MED ORDER — POLYVINYL ALCOHOL 1.4 % OP SOLN: 1.0000 [drp] | Freq: Four times a day (QID) | OPHTHALMIC | Status: DC | PRN

## 2023-06-24 NOTE — Progress Notes (Signed)
Notified Dr. Denton Lank of patients blood sugar 68. Patient is lethargic. MD will place orders, continue to assess.

## 2023-06-24 NOTE — Progress Notes (Signed)
Physical Therapy Discharge Patient Details Name: HULET KRAMPITZ MRN: [id number] DOB: [birth year] Today's Date: 06/24/2023 Time:  -     Patient discharged from PT services secondary to medical decline - will need to re-order PT to resume therapy services. Pt has been placed on comfort measures. PT order has been discontinued.  Please see latest therapy progress note for current level of functioning and progress toward goals.         Rushie Chestnut 06/24/2023, 11:24 AM

## 2023-06-24 NOTE — TOC Initial Note (Signed)
Transition of Care [facility]) - Initial/Assessment Note    Patient Details  Name: Justin [name] MRN: [id number] Date of Birth: 12/21/1945  Transition of [facility]) CM/SW Contact:    Allena Katz, LCSW Phone Number: 06/24/2023, 12:27 PM  Clinical Narrative:   Pt admitted from [place] as LTC patient. Pt was getting dialysis through [facility] prior to admission.  Pt has now transitioned to comfort. Family would like to keep pt here and then possibly transfer to inpatient hospice.                Expected Discharge Plan: Skilled Nursing Facility Barriers to Discharge: Continued Medical Work up   Patient Goals and CMS Choice            Expected Discharge Plan and Services                                              Prior Living Arrangements/Services   Lives with:: Facility Resident Patient language and need for interpreter reviewed:: Yes          Care giver support system in place?: Yes (comment) Current home services: DME    Activities of Daily Living      Permission Sought/Granted                  Emotional Assessment     Affect (typically observed): Accepting Orientation: : Oriented to Self      Admission diagnosis:  Septic shock (HCC) [A41.9, R65.21] Patient Active Problem List   Diagnosis Date Noted   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   Pressure injury of skin 06/21/2023   Acute metabolic encephalopathy 06/20/2023   Protein-calorie malnutrition, severe (HCC) 06/20/2023   HCAP (healthcare-associated pneumonia) 06/20/2023   Indwelling percutaneous cholecystostomy drain present 06/20/2023   History of Klebsiella bacteremia 04/15/2023 06/20/2023   History of ischial osteomyelitis s/p treatment (HCC) 06/20/2023   Severe sepsis (HCC) 06/19/2023   Occult blood positive stool 06/02/2023   Hypoglycemia 06/01/2023   ESRD (end stage renal disease) (HCC) 06/01/2023   Coagulopathy (HCC) 06/01/2023   Chronic hypotension  06/01/2023   History of GI bleed 04/13/2023   Acute hip pain, left 04/13/2023   SIRS (systemic inflammatory response syndrome) (HCC) 04/13/2023   Ambulatory dysfunction 04/13/2023   History of cholecystitis 04/13/2023   Dysphagia 04/13/2023   Shock (HCC) 12/22/2022   Rectal ulcer 12/17/2022   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   Hemorrhagic shock (HCC) 12/14/2022   Acute encephalopathy 11/09/2022   Syncope 11/09/2022   Acute blood loss anemia 11/09/2022   Melena 11/09/2022   History of colonic polyps 11/09/2022   End stage renal disease (HCC) 11/09/2022   Gastric mass, suspect GIST (EGD 12/2022) 11/09/2022   Rectal bleeding 09/17/2022   Colon ulcer 09/17/2022   Lower GI bleeding 09/17/2022   GI bleed 09/16/2022   Prolonged QT interval 09/16/2022   History of prostate cancer 09/16/2022   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   History of colon polyps 09/16/2022   Lower GI bleed 09/12/2022   Acute GI bleeding 09/10/2022   Wide-complex tachycardia 06/17/2022   Hypotension 06/13/2022   Obesity (BMI 30-39.9) 06/13/2022   Perirectal abscess 06/13/2022   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   Murmur, cardiac 01/27/2022   Chronic back pain 01/27/2022   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/27/2022   GERD (gastroesophageal reflux disease) 11/11/2021  High risk medication use 11/11/2021   History of cardioversion 11/11/2021   Paroxysmal atrial fibrillation with RVR (HCC) 11/11/2021   PVC (premature ventricular contraction) 11/11/2021   Nonrheumatic aortic valve stenosis 11/06/2021   Mixed hyperlipidemia 11/06/2021   Abdominal mass 05/05/2021   First degree AV block 07/13/2020   Paroxysmal A-fib (HCC) 01/29/2020   Wound infection 01/28/2020   H/O vitrectomy 12/07/2019   Macular pucker, 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 determined by examination associated with type 2 diabetes mellitus (HCC) 10/24/2019   Stable treated  proliferative diabetic retinopathy of left eye determined by examination associated with type 2 diabetes mellitus (HCC) 10/24/2019   ESRD on hemodialysis (HCC) 07/29/2017   Anticoagulated 07/13/2017   Malignant neoplasm of prostate (HCC) 07/12/2017   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   Coagulation defect, unspecified (HCC) 01/08/2017   Idiopathic gout 01/08/2017   Acute on chronic blood loss anemia 11/20/2015   Blurred vision, left eye 07/10/2011   Essential hypertension 07/10/2011   DM (diabetes mellitus) (HCC) 07/10/2011   PCP:  System, Provider Not In Pharmacy:   [id number] - [place], [place] - [address] AT [facility] [address] [place] [address] Phone: [phone] Fax: [phone]  [facility] [address] [place] [address] Phone: [phone] Fax: [phone]  [facility] - [place], [place] - [address] [place] [address] Phone: [phone] Fax: [phone]  [facility] - [address] [place], [place] - [address] [place] [address] Phone: [phone] Fax: [phone]     Social Drivers of Health (SDOH) Social History: SDOH Screenings   Food Insecurity: No Food Insecurity (06/20/2023)  Recent Concern: Food Insecurity - Food Insecurity Present (04/13/2023)  Housing: Low Risk  (06/20/2023)  Transportation Needs: Patient Unable To Answer (06/20/2023)  Recent Concern: Transportation Needs - Unmet Transportation Needs (06/01/2023)  Utilities: Not At Risk (06/20/2023)  Social Connections: Unknown (10/13/2022)   Received from [facility]  Stress: No Stress Concern Present (10/14/2022)   Received from [facility]  Tobacco Use: Medium Risk (06/19/2023)   SDOH Interventions:     Readmission Risk Interventions    06/02/2023    2:16 PM 03/30/2023    2:29 PM  11/14/2022   10:08 AM  Readmission Risk Prevention Plan  Transportation Screening Complete Complete Complete  Medication Review (RN Care Manager) Complete Referral to Pharmacy Complete  PCP or Specialist appointment within 3-5 days of discharge Not Complete Complete Complete  HRI or Home Care Consult Complete Complete Complete  SW Recovery Care/Counseling Consult Complete Complete Complete  Palliative Care Screening Not Applicable Not Applicable Not Applicable  Skilled Nursing Facility Not Applicable Not Applicable Complete

## 2023-06-24 NOTE — Progress Notes (Signed)
[facility]  ROUNDING NOTE   Subjective:   Palliative care did meet with the patient and some family members yesterday. Dialysis was attempted 2 days earlier in the week and patient did not tolerate treatments very well at all. Patient is arousable at the moment but confused. Family did agree to hold further dialysis treatments till goals of care to be clarified.  Objective:  Vital signs in last 24 hours:  Temp:  [98 F (36.7 C)-98.8 F (37.1 C)] 98.8 F (37.1 C) (12/12 0400) Pulse Rate:  [40-97] 72 (12/12 0700) Resp:  [13-26] 26 (12/12 0700) BP: (76-149)/(44-94) 82/44 (12/12 0700) SpO2:  [74 %-100 %] 98 % (12/12 0700)  Weight change:  Filed Weights   06/21/23 1346 06/22/23 0444 06/22/23 1013  Weight: 62.9 kg 62.9 kg 63 kg    Intake/Output: I/O last 3 completed shifts: In: 580.2 [P.O.:270; I.V.:10.2; IV Piggyback:300] Out: 525 [Drains:525]   Intake/Output this shift:  No intake/output data recorded.  Physical Exam: General: No acute distress  Head: Normocephalic, atraumatic. Moist oral mucosal membranes  Neck: Supple  Lungs:  Clear to auscultation, normal effort  Heart: S1S2 no rubs  Abdomen:  Soft, nontender, bowel sounds present  Extremities: Trace peripheral edema.  Neurologic: Arousable, confused  Skin: No acute rash  Access: Left IJ PermCath    Basic Metabolic Panel: Recent Labs  Lab 06/19/23 1856 06/20/23 0520 06/21/23 1421 06/22/23 0440  NA 140 138 140 139  K 3.3* 2.9* 3.7 3.5  CL 99 102 107 105  CO2 30 26 23 26   GLUCOSE 87 91 125* 105*  BUN 35* 35* 45* 43*  CREATININE 5.61* 5.80* 6.94* 6.53*  CALCIUM 10.2 9.6 10.2 10.1    Liver Function Tests: Recent Labs  Lab 06/19/23 1856 06/20/23 0520  AST 23 22  ALT 13 13  ALKPHOS 67 54  BILITOT 1.0 1.0  PROT 8.2* 6.5  ALBUMIN 2.5* 2.0*   No results for input(s): "LIPASE", "AMYLASE" in the last 168 hours. No results for input(s): "AMMONIA" in the last 168 hours.  CBC: Recent  Labs  Lab 06/19/23 1856 06/20/23 0520 06/21/23 1421 06/22/23 0440  WBC 11.1* 12.3* 8.1 9.2  NEUTROABS 8.9*  --  6.5 7.5  HGB 10.2* 8.8* 8.4* 8.3*  HCT 34.3* 29.5* 27.1* 27.5*  MCV 99.7 99.7 98.9 98.6  PLT 140* 122* 155 142*    Cardiac Enzymes: No results for input(s): "CKTOTAL", "CKMB", "CKMBINDEX", "TROPONINI" in the last 168 hours.  BNP: Invalid input(s): "POCBNP"  CBG: Recent Labs  Lab 06/22/23 1749 06/22/23 2149 06/23/23 1620 06/23/23 2149 06/24/23 0737  GLUCAP 93 95 94 88 67*    Microbiology: Results for orders placed or performed during the hospital encounter of 06/19/23  Blood Culture (routine x 2)     Status: None   Collection Time: 06/19/23  6:57 PM   Specimen: BLOOD  Result Value Ref Range Status   Specimen Description BLOOD RIGHT [facility]  Final   Special Requests   Final    BOTTLES DRAWN AEROBIC AND ANAEROBIC Blood Culture results may not be optimal due to an inadequate volume of blood received in culture bottles   Culture   Final    NO GROWTH 5 DAYS Performed at [facility], [address] [place], [place] [address]    Report Status 06/24/2023 FINAL  Final  Blood Culture (routine x 2)     Status: None   Collection Time: 06/19/23  9:05 PM   Specimen: BLOOD  Result Value  Ref Range Status   Specimen Description BLOOD RIGTH FA  Final   Special Requests   Final    BOTTLES DRAWN AEROBIC AND ANAEROBIC Blood Culture adequate volume   Culture   Final    NO GROWTH 5 DAYS Performed at [facility], [address] [place], [place] [address]    Report Status 06/24/2023 FINAL  Final  MRSA Next Gen by PCR, Nasal     Status: None   Collection Time: 06/20/23  7:55 PM   Specimen: Nasal Mucosa; Nasal Swab  Result Value Ref Range Status   MRSA by PCR Next Gen NOT DETECTED NOT DETECTED Final    Comment: (NOTE) The GeneXpert MRSA Assay (FDA approved for NASAL specimens only), is one component of a comprehensive MRSA colonization  surveillance program. It is not intended to diagnose MRSA infection nor to guide or monitor treatment for MRSA infections. Test performance is not FDA approved in patients less than 30 years old. Performed at [facility], [address] [place], [place] [address]   Body fluid culture w Gram Stain     Status: Abnormal   Collection Time: 06/21/23  4:45 AM   Specimen: BILE; Body Fluid  Result Value Ref Range Status   Specimen Description   Final    BILE Performed at [facility], [address], [place], [place] [address]    Special Requests   Final    NONE Performed at [facility], [address] [place], [place] [address]    Gram Stain NO WBC SEEN FEW GRAM NEGATIVE RODS   Final   Culture (A)  Final    MULTIPLE ORGANISMS PRESENT, NONE PREDOMINANT NO STAPHYLOCOCCUS AUREUS ISOLATED NO GROUP A STREP (S.PYOGENES) ISOLATED No Pseudomonas species isolated Performed at [facility], [address] [place], [place] [address]    Report Status 06/23/2023 FINAL  Final    Coagulation Studies: No results for input(s): "LABPROT", "INR" in the last 72 hours.  Urinalysis: No results for input(s): "COLORURINE", "LABSPEC", "PHURINE", "GLUCOSEU", "HGBUR", "BILIRUBINUR", "KETONESUR", "PROTEINUR", "UROBILINOGEN", "NITRITE", "LEUKOCYTESUR" in the last 72 hours.  Invalid input(s): "APPERANCEUR"    Imaging: No results found.   Medications:    ceFEPime (MAXIPIME) IV 1 g (06/23/23 2134)   metronidazole 500 mg (06/23/23 2232)   norepinephrine (LEVOPHED) Adult infusion Stopped (06/22/23 1254)    vitamin C  500 mg Oral BID   Chlorhexidine Gluconate Cloth  6 each Topical Q0600   dextrose  1 ampule Intravenous Once   feeding supplement  237 mL Oral TID BM   heparin  5,000 Units Subcutaneous Q8H   midodrine  10 mg Oral TID WC   multivitamin  1 tablet Oral QHS   acetaminophen **OR** acetaminophen  Assessment/ Plan:  77 y.o. male with end stage renal  disease on hemodialysis, prostate cancer, diabetes mellitus type II, diabetes retinopathy, gout, hypertension, anemia of chronic kidney disease, secondary hyperparathyroidism, history of gastric mass, chronic hypotension who was admitted with healthcare acquired pneumonia.  [facility]/TTS/left IJ PermCath/107 kg  1.  ESRD on HD TTS.  Patient missed 2 treatments prior to admission.   -Dialysis was attempted 2 days earlier in the week but he developed significant hypotension with both attempts.  Therefore it was decided to hold further dialysis treatments.  Appreciate palliative care input.  Goals of care being clarified but family did agree to hold dialysis until the goals could be clarified.  2.  Anemia with chronic kidney disease.  Patient with history of known gastric mass.  We are holding Procrit at this time. Lab Results  Component Value Date   HGB 8.3 (L) 06/22/2023    3.  Secondary hyperparathyroidism.  Was on Velphoro and Cinacalcet at home.  Binders remain on hold.  4.  Hypotension.  Currently off of Levophed.  Still on midodrine at the moment.   LOS: 5 Karl Knarr 12/12/20247:53 AM

## 2023-06-24 NOTE — Progress Notes (Signed)
Patient arrived on unit in bed.  Patient oriented to room and unit with understanding unknown due to decreased level of consciousness.

## 2023-06-24 NOTE — Progress Notes (Signed)
Daily Progress Note   Patient Name: Justin [name]       Date: 06/24/2023 DOB: [birth year]  Age: 77 y.o. MRN#: [id number] Attending Physician: Justin Banter, DO Primary Care Physician: System, Provider Not In Admit Date: 06/19/2023  Reason for Consultation/Follow-up: Establishing goals of care   Patient Profile/HPI: 77 y.o. male  with past medical history of ESRD on HD, chronic hypotension, ischial osteomyelitis, DM2, HTN, HLD, a-fib- not on Eliquis due to GI bleeding/GIST (hospitalized 11/19-11/23 for significant blood loss), prostate ca, cholecystitis with biliary drain in place  admitted on 06/19/2023 with severe sepsis related to pneumonia. Recovery has been complicated by significant hypotension. He has not been tolerating his dialysis due to hypotension. His mental status is poor and currently he is declining medications, food and all interventions. Palliative medicine consulted for GOC.       Subjective: Chart reviewed including labs, progress notes, imaging from this and previous encounters.  Patient awake this morning, but very lethargic, minimally responsive. Taking po meds and food only with insistence of his sister. Continues to be hypotensive. Only intelligible verbal response is "I want to go home".  His sister Justin [name], niece Justin [name], and stepdaughter Justin [name] are at bedside.  After discussion with the three of them the confirmed that the HCPOA that was on patient's chart is accurate- they also produced the original document.  We met outside patient's room in conference.  Justin [name] has been on decline for the last year.  He has declined to the point where is incontinent of feces and urine. Dependent for all iADL and ADL. Wheelchair and bed bound. Has lost a great  deal of weight.  We reviewed his hospital course and current barriers to improvements, as well as his ongoing refusal of medications, food, and other interventions. Also discussed difficulties with completing full HD sessions.  Family was very clear that they understand that Justin [name] is dying. Justin [name] shares that her and Justin [name]'s mother died after not being able to tolerate HD and she is seeing many similarities in Justin [name]'s presentation.  We discussed transition to full comfort measures. Discussed transition to comfort measures only which includes stopping IV fluids, antibiotics, labs and providing symptom management for SOB, anxiety, nausea, vomiting, and other symptoms of dying.  Everyone present in agreement with transition to full  comfort and allowing natural dying process with good symptom management.  Their goal is that Justin [name] will not suffer.  We discussed possible inpatient hospice- they note that Justin [name] gets very agitated when he has to move rooms or places. Their preference would be to keep him here, however, if after transition to full comfort measures it looks that he will survive for more than a few days then they are open to discussion transfer to inpatient hospice.   Review of Systems  Unable to perform ROS: Acuity of condition     Physical Exam Vitals and nursing note reviewed.  Constitutional:      General: He is not in acute distress.    Appearance: He is cachectic. He is ill-appearing.  Cardiovascular:     Rate and Rhythm: Normal rate.  Pulmonary:     Effort: Pulmonary effort is normal.  Musculoskeletal:     Comments: Generalized weakness  Neurological:     Mental Status: He is lethargic and confused.             Vital Signs: BP (!) 88/47   Pulse 66   Temp 98.8 F (37.1 C) (Axillary)   Resp (!) 0   Ht 5\' 10"  (1.778 m)   Wt 63 kg   SpO2 99%   BMI 19.93 kg/m  SpO2: SpO2: 99 % O2 Device: O2 Device: Room Air O2 Flow Rate: O2 Flow Rate (L/min): 4  L/min  Intake/output summary:  Intake/Output Summary (Last 24 hours) at 06/24/2023 1050 Last data filed at 06/24/2023 0600 Gross per 24 hour  Intake 280 ml  Output 230 ml  Net 50 ml   LBM: Last BM Date : 06/22/23 Baseline Weight: Weight: 67.8 kg Most recent weight: Weight: 63 kg       Palliative Assessment/Data: PPS: 20%      Patient Active Problem List   Diagnosis Date Noted   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   Pressure injury of skin 06/21/2023   Acute metabolic encephalopathy 06/20/2023   Protein-calorie malnutrition, severe (HCC) 06/20/2023   HCAP (healthcare-associated pneumonia) 06/20/2023   Indwelling percutaneous cholecystostomy drain present 06/20/2023   History of Klebsiella bacteremia 04/15/2023 06/20/2023   History of ischial osteomyelitis s/p treatment (HCC) 06/20/2023   Severe sepsis (HCC) 06/19/2023   Occult blood positive stool 06/02/2023   Hypoglycemia 06/01/2023   ESRD (end stage renal disease) (HCC) 06/01/2023   Coagulopathy (HCC) 06/01/2023   Chronic hypotension 06/01/2023   History of GI bleed 04/13/2023   Acute hip pain, left 04/13/2023   SIRS (systemic inflammatory response syndrome) (HCC) 04/13/2023   Ambulatory dysfunction 04/13/2023   History of cholecystitis 04/13/2023   Dysphagia 04/13/2023   Shock (HCC) 12/22/2022   Rectal ulcer 12/17/2022   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   Hemorrhagic shock (HCC) 12/14/2022   Acute encephalopathy 11/09/2022   Syncope 11/09/2022   Acute blood loss anemia 11/09/2022   Melena 11/09/2022   History of colonic polyps 11/09/2022   End stage renal disease (HCC) 11/09/2022   Gastric mass, suspect GIST (EGD 12/2022) 11/09/2022   Rectal bleeding 09/17/2022   Colon ulcer 09/17/2022   Lower GI bleeding 09/17/2022   GI bleed 09/16/2022   Prolonged QT interval 09/16/2022   History of prostate cancer 09/16/2022   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   History of colon polyps 09/16/2022   Lower GI  bleed 09/12/2022   Acute GI bleeding 09/10/2022   Wide-complex tachycardia 06/17/2022   Hypotension 06/13/2022   Obesity (BMI 30-39.9) 06/13/2022   Perirectal abscess  06/13/2022   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   Murmur, cardiac 01/27/2022   Chronic back pain 01/27/2022   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/27/2022   GERD (gastroesophageal reflux disease) 11/11/2021   High risk medication use 11/11/2021   History of cardioversion 11/11/2021   Paroxysmal atrial fibrillation with RVR (HCC) 11/11/2021   PVC (premature ventricular contraction) 11/11/2021   Nonrheumatic aortic valve stenosis 11/06/2021   Mixed hyperlipidemia 11/06/2021   Abdominal mass 05/05/2021   First degree AV block 07/13/2020   Paroxysmal A-fib (HCC) 01/29/2020   Wound infection 01/28/2020   H/O vitrectomy 12/07/2019   Macular pucker, 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 determined by examination associated with type 2 diabetes mellitus (HCC) 10/24/2019   Stable treated proliferative diabetic retinopathy of left eye determined by examination associated with type 2 diabetes mellitus (HCC) 10/24/2019   ESRD on hemodialysis (HCC) 07/29/2017   Anticoagulated 07/13/2017   Malignant neoplasm of prostate (HCC) 07/12/2017   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   Coagulation defect, unspecified (HCC) 01/08/2017   Idiopathic gout 01/08/2017   Acute on chronic blood loss anemia 11/20/2015   Blurred vision, left eye 07/10/2011   Essential hypertension 07/10/2011   DM (diabetes mellitus) (HCC) 07/10/2011    Palliative Care Assessment & Plan    Assessment/Recommendations/Plan  Sepsis, ESRD on HD- not tolerating HD, failure to thrive- plan to transition to full comfort measures only- comfort care order set entered, medications and interventions not contributing to comfort dc'd Reassess status tomorrow and discuss  possible transfer to inpatient hospice if he looks that he is going to survive more than a few days- family would prefer if he stays in hospital   Code Status: DNR  Prognosis:  < 2 weeks  Discharge Planning: Anticipated Hospital Death  Care plan was discussed with family and care team  Thank you for allowing the Palliative Medicine Team to assist in the care of this patient.  Total time:  90 minutes Prolonged billing:  Time includes:   Preparing to see the patient (e.g., review of tests) Obtaining and/or reviewing separately obtained history Performing a medically necessary appropriate examination and/or evaluation Counseling and educating the patient/family/caregiver Ordering medications, tests, or procedures Referring and communicating with other health care professionals (when not reported separately) Documenting clinical information in the electronic or other health record Independently interpreting results (not reported separately) and communicating results to the patient/family/caregiver Care coordination (not reported separately) Clinical documentation  Ocie Bob, AGNP-C Palliative Medicine   Please contact Palliative Medicine Team phone at [phone] for questions and concerns.

## 2023-06-24 NOTE — Progress Notes (Signed)
Report given to Justin [name]. Family notified patient will be moving to room 126. Patient alert to self only. Room air, anuric. At this time he is not in any distress. Continue to assess.

## 2023-06-24 NOTE — Progress Notes (Signed)
Patient made comfort care. Removed patients blood pressure cuff, tele and Spo2 probe. Comfort care cart called for family. Repositioned patient with warm blankets. Continue to assess.

## 2023-06-24 NOTE — Progress Notes (Addendum)
Progress Note   Patient: Justin [name] DOB: 07-Apr-1946 DOA: 06/19/2023     5 DOS: the patient was seen and examined on 06/24/2023   Brief hospital course: "Justin [name] is a 77 y.o. 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 ED course and data review: On arrival BP 96/60 downtrending to 81/45 for which he received a dose of midodrine.   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 Patient has been seen by gastroenterologist." See H&P for full HPI on admission & ED course.  Further hospital course and management as outlined below.  Hospital course complicated by hypotension which has limited ability to perform hemodialysis.  Palliative Care consulted for goals of care discussions.  Patient was transitioned to comfort care measures on 06/24/2023.  Given severity of acute illness and multiple underlying co-morbid conditions, and malnutrition, frail and deconditioned state, patient is very high risk for further morbidity and mortality despite all medical interventions that can be offered.         Assessment and Plan:   COMFORT CARE STATUS - as of 06/24/2023 Comfort orders placed by Palliative Care NP Notifiy provider if any signs on uncontrolled pain, restlessness, agitation or discomfort despite using ordered medications. Monitor clinical course with potential for d/c to  inpatient hospice if needed   -------------------------------------------------------------------------------------------- Below is assessment & plan prior to transitioning to comfort care:  Septic shock [facility]) Healthcare acquired pneumonia Presence of Biliary drain (exchanged 10/24) and tunneled RIJ dialysis catheter (placed 10/7) History of Klebsiella bacteremia 04/2023 History of ischial osteomyelitis 04/2023 s/p Fortaz Sepsis criteria include hypertension, low-grade temp tachycardia, hypotension and relative hypoxia with leukocytosis and lung infiltrate on CT Left lower lobe pneumonia seen on CT abdomen and pelvis Patient has a biliary drain and dialysis catheter which can also be potential sources of infection MRSA screen was negative, Vancomycin dc'd Continue cefepime Monitor for fluid overload given dialysis session with missed session on the day of arrival  Follow-up on culture results and if positive we will consider catheter removal Nephrology following  Not able to tolerate dialysis multiple attempts, limited by severe hypotension Intensivist consulted for pressors Initiated on Levophed 06/22/2023 Maintain MAP > 65    History of Klebsiella bacteremia 04/15/2023 Received Elita Quick during HD for 6 weeks.  Last received 12/3 Follows with ID   Gastric mass Previous imaging as well as current CT scan of the abdomen showing findings of gastric mass Gastroenterologist was consulted Patient underwent colonoscopy/EGD in June 2024 and gastric lesion was thought to be due to GIST. Gastroenterologist planning for outpatient follow-up with EUS and biopsies once clinically stable. Patient will follow-up at [facility] where they have endoscopic ultrasound capabilities if able to leave the hospital GI has signed off   Chronic hypotension Now with septic shock PCCM consulted, Levophed, maintain MAP > 65 Continue management as above   Acute metabolic encephalopathy Secondary to  sepsis CT head nonacute Diet resumed Continue  fall and aspiration precautions   Indwelling percutaneous cholecystostomy drain present History of cholecystitis, not a surgical candidate Percutaneous drain bag, draining well, with thick yellowish-green sludgy material Last exchanged 05/06/2023 Potential source of infection Follow-up on culture results   History of GI bleed Gastric varices, 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   No longer on Eliquis since GI bleed 05/2023   ESRD on hemodialysis [facility]) Nephrologist on board for dialysis needs   DM (diabetes mellitus) (HCC) Sliding scale insulin coverage Monitor glucose closely   Protein-calorie malnutrition, severe (HCC) Frailty-nonambulant at baseline Patient with 2 fairly recent prolonged hospitalizations in October and November 2024 Patient cachectic with temporal muscle wasting and prominence of bony eminences Dietary consulted Patient with significant weight loss with complex medical conditions with associated poor prognosis Palliative care have been consulted for goals of care discussion   History of prostate cancer No acute issues suspected   History of ischial osteomyelitis s/p treatment (HCC) Completed 8 weeks course of antibiotics and last seen by ID on 06/15/2023   Multiple pressure injuries of skin I agree with the wound descriptions as outlined below See wound care orders Continue frequent repositioning, offload pressure areas Pressure Injury 06/21/23 Heel Left;Posterior Unstageable - Full thickness tissue loss in which the base of the injury is covered by slough (yellow, tan, gray, green or brown) and/or eschar (tan, brown or black) in the wound bed. (Active)  06/21/23 0700  Location: Heel  Location Orientation: Left;Posterior  Staging: Unstageable - Full thickness tissue loss in which the base of the injury is covered  by slough (yellow, tan, gray, green or brown) and/or eschar (tan, brown or black) in the wound bed.  Wound Description (Comments):   Present on Admission: Yes     Pressure Injury 06/21/23 Heel Right Unstageable - Full thickness tissue loss in which the base of the injury is covered by slough (yellow, tan, gray, green or brown) and/or eschar (tan, brown or black) in the wound bed. (Active)  06/21/23 0700  Location: Heel  Location Orientation: Right  Staging: Unstageable - Full thickness tissue loss in which the base of the injury is covered by slough (yellow, tan, gray, green or brown) and/or eschar (tan, brown or black) in the wound bed.  Wound Description (Comments):   Present on Admission: Yes     Pressure Injury 06/20/23 Sacrum Mid Stage 2 -  Partial thickness loss of dermis presenting as a shallow open injury with a red, pink wound bed without slough. tiny 1/2 cm x 1/2 cm x 2 wounds. (Active)  06/20/23 1400  Location: Sacrum  Location Orientation: Mid  Staging: Stage 2 -  Partial thickness loss of dermis presenting as a shallow open injury with a red, pink wound bed without slough.  Wound Description (Comments): tiny 1/2 cm x 1/2 cm x 2 wounds.  Present on Admission: Yes         DVT prophylaxis: discontinued   Consults: Nephrology, Palliative Care, PCCM   Advance Care Planning:   Code Status: Prior     Disposition Plan: In hospital death versus discharge to inpatient hospice     Subjective:  Patient unresponsive again when seen this AM on rounds. No family present at the time. No acute events reported.  Notified later this AM by Palliative Care NP that patient's family decided on transition to comfort care today.   Physical Exam: General exam: somnolent, does  not arouse to voice + sternal rub, no acute distress Respiratory system: CTAB, no wheezes, rales or rhonchi, normal respiratory effort. Cardiovascular system: normal S1/S2, RRR, +systolic murmur, no pedal edema.    Gastrointestinal system: soft, NT, ND, cholecystostomy tube in place Central nervous system: exam limited by somnolence Extremities: moves all , no edema, normal tone Skin: dry, intact, normal temperature Psychiatry: exam limited by somnolence      Family Communication: None present on rounds. Family met with Palliative Care and decided on transition to comfort care.  No additional medical updates to provide at this time.     Disposition: Status is: Inpatient   Time spent: 40 minutes     Data Reviewed: I reviewed gastroenterologist notes as well as previous endoscopy reports    Latest Ref Rng & Units 06/22/2023    4:40 AM 06/21/2023    2:21 PM 06/20/2023    5:20 AM  CBC  WBC 4.0 - 10.5 K/uL 9.2  8.1  12.3   Hemoglobin 13.0 - 17.0 g/dL 8.3  8.4  8.8   Hematocrit 39.0 - 52.0 % 27.5  27.1  29.5   Platelets 150 - 400 K/uL 142  155  122        Latest Ref Rng & Units 06/22/2023    4:40 AM 06/21/2023    2:21 PM 06/20/2023    5:20 AM  BMP  Glucose 70 - 99 mg/dL 952  841  91   BUN 8 - 23 mg/dL 43  45  35   Creatinine 0.61 - 1.24 mg/dL 3.24  4.01  0.27   Sodium 135 - 145 mmol/L 139  140  138   Potassium 3.5 - 5.1 mmol/L 3.5  3.7  2.9   Chloride 98 - 111 mmol/L 105  107  102   CO2 22 - 32 mmol/L 26  23  26    Calcium 8.9 - 10.3 mg/dL 25.3  66.4  9.6     Vitals:   06/24/23 0700 06/24/23 0800 06/24/23 0900 06/24/23 1000  BP: (!) 82/44 (!) 127/94 (!) 88/47 123/65  Pulse: 72 73 66 74  Resp: (!) 26 16 (!) 0 15  Temp:      TempSrc:      SpO2: 98% 98% 99% 100%  Weight:      Height:         Author: Pennie Banter, DO 06/24/2023 2:26 PM  For on call review [email or web address].

## 2023-06-25 DIAGNOSIS — G9341 Metabolic encephalopathy: Secondary | ICD-10-CM | POA: Diagnosis not present

## 2023-06-25 DIAGNOSIS — Z515 Encounter for palliative care: Secondary | ICD-10-CM | POA: Diagnosis not present

## 2023-06-25 DIAGNOSIS — I9589 Other hypotension: Secondary | ICD-10-CM | POA: Diagnosis not present

## 2023-06-25 DIAGNOSIS — A419 Sepsis, unspecified organism: Secondary | ICD-10-CM | POA: Diagnosis not present

## 2023-06-25 DIAGNOSIS — R6520 Severe sepsis without septic shock: Secondary | ICD-10-CM | POA: Diagnosis not present

## 2023-06-25 NOTE — Progress Notes (Signed)
 Nutrition Brief Note  Chart reviewed. Pt now transitioning to comfort care.  No further nutrition interventions planned at this time.  Please re-consult as needed.   Levada Schilling, RD, LDN, CDCES Registered Dietitian III Certified Diabetes Care and Education Specialist If unable to reach this RD, please use "RD Inpatient" group chat on secure chat between hours of 8am-4 pm daily

## 2023-06-25 NOTE — Progress Notes (Signed)
Progress Note   Patient: Justin [name] UEA:[id number] DOB: [birth year] DOA: 06/19/2023     6 DOS: the patient was seen and examined on 06/25/2023   Brief hospital course: "Justin [name] is a 78 y.o. 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 ED course and data review: On arrival BP 96/60 downtrending to 81/45 for which he received a dose of midodrine.   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 Patient has been seen by gastroenterologist." See H&P for full HPI on admission & ED course.  Further hospital course and management as outlined below.  Hospital course complicated by hypotension which has limited ability to perform hemodialysis.  Palliative Care consulted for goals of care discussions.  Patient was transitioned to comfort care measures on 06/24/2023.  Given severity of acute illness and multiple underlying co-morbid conditions, and malnutrition, frail and deconditioned state, patient is very high risk for further morbidity and mortality despite all medical interventions that can be offered.         Assessment and Plan:   COMFORT CARE STATUS - as of 06/24/2023 Comfort orders placed by Palliative Care NP Notifiy provider if any signs on uncontrolled pain, restlessness, agitation or discomfort despite using ordered medications. Monitor clinical course with potential for d/c to  inpatient hospice if needed   -------------------------------------------------------------------------------------------- Below is assessment & plan prior to transitioning to comfort care:  Septic shock [facility]) Healthcare acquired pneumonia Presence of Biliary drain (exchanged 10/24) and tunneled RIJ dialysis catheter (placed 10/7) History of Klebsiella bacteremia 04/2023 History of ischial osteomyelitis 04/2023 s/p Fortaz Sepsis criteria include hypertension, low-grade temp tachycardia, hypotension and relative hypoxia with leukocytosis and lung infiltrate on CT Left lower lobe pneumonia seen on CT abdomen and pelvis Patient has a biliary drain and dialysis catheter which can also be potential sources of infection MRSA screen was negative, Vancomycin dc'd Continue cefepime Monitor for fluid overload given dialysis session with missed session on the day of arrival  Follow-up on culture results and if positive we will consider catheter removal Nephrology following  Not able to tolerate dialysis multiple attempts, limited by severe hypotension Intensivist consulted for pressors Initiated on Levophed 06/22/2023 Maintain MAP > 65    History of Klebsiella bacteremia 04/15/2023 Received Justin [name] during HD for 6 weeks.  Last received 12/3 Follows with ID   Gastric mass Previous imaging as well as current CT scan of the abdomen showing findings of gastric mass Gastroenterologist was consulted Patient underwent colonoscopy/EGD in June 2024 and gastric lesion was thought to be due to GIST. Gastroenterologist planning for outpatient follow-up with EUS and biopsies once clinically stable. Patient will follow-up at [facility] where they have endoscopic ultrasound capabilities if able to leave the hospital GI has signed off   Chronic hypotension Now with septic shock PCCM consulted, Levophed, maintain MAP > 65 Continue management as above   Acute metabolic encephalopathy Secondary to  sepsis CT head nonacute Diet resumed Continue  fall and aspiration precautions   Indwelling percutaneous cholecystostomy drain present History of cholecystitis, not a surgical candidate Percutaneous drain bag, draining well, with thick yellowish-green sludgy material Last exchanged 05/06/2023 Potential source of infection Follow-up on culture results   History of GI bleed Gastric varices, 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   No longer on Eliquis since GI bleed 05/2023   ESRD on hemodialysis [facility]) Nephrologist on board for dialysis needs   DM (diabetes mellitus) (HCC) Sliding scale insulin coverage Monitor glucose closely   Protein-calorie malnutrition, severe (HCC) Frailty-nonambulant at baseline Patient with 2 fairly recent prolonged hospitalizations in October and November 2024 Patient cachectic with temporal muscle wasting and prominence of bony eminences Dietary consulted Patient with significant weight loss with complex medical conditions with associated poor prognosis Palliative care have been consulted for goals of care discussion   History of prostate cancer No acute issues suspected   History of ischial osteomyelitis s/p treatment (HCC) Completed 8 weeks course of antibiotics and last seen by ID on 06/15/2023   Multiple pressure injuries of skin I agree with the wound descriptions as outlined below See wound care orders Continue frequent repositioning, offload pressure areas Pressure Injury 06/21/23 Heel Left;Posterior Unstageable - Full thickness tissue loss in which the base of the injury is covered by slough (yellow, tan, gray, green or brown) and/or eschar (tan, brown or black) in the wound bed. (Active)  06/21/23 0700  Location: Heel  Location Orientation: Left;Posterior  Staging: Unstageable - Full thickness tissue loss in which the base of the injury is covered  by slough (yellow, tan, gray, green or brown) and/or eschar (tan, brown or black) in the wound bed.  Wound Description (Comments):   Present on Admission: Yes     Pressure Injury 06/21/23 Heel Right Unstageable - Full thickness tissue loss in which the base of the injury is covered by slough (yellow, tan, gray, green or brown) and/or eschar (tan, brown or black) in the wound bed. (Active)  06/21/23 0700  Location: Heel  Location Orientation: Right  Staging: Unstageable - Full thickness tissue loss in which the base of the injury is covered by slough (yellow, tan, gray, green or brown) and/or eschar (tan, brown or black) in the wound bed.  Wound Description (Comments):   Present on Admission: Yes     Pressure Injury 06/20/23 Sacrum Mid Stage 2 -  Partial thickness loss of dermis presenting as a shallow open injury with a red, pink wound bed without slough. tiny 1/2 cm x 1/2 cm x 2 wounds. (Active)  06/20/23 1400  Location: Sacrum  Location Orientation: Mid  Staging: Stage 2 -  Partial thickness loss of dermis presenting as a shallow open injury with a red, pink wound bed without slough.  Wound Description (Comments): tiny 1/2 cm x 1/2 cm x 2 wounds.  Present on Admission: Yes         DVT prophylaxis: discontinued   Consults: Nephrology, Palliative Care, PCCM   Advance Care Planning:   Code Status: Prior     Disposition Plan: In hospital death versus discharge to inpatient hospice     Subjective:  Pt awake when seen this AM, step-daughter at bedside.  Pt denies pain or feeling sick or other acute complaints.     Physical Exam: General exam: awake, no acute distress Respiratory system: CTAB, no wheezes, rales or rhonchi, normal respiratory effort. Cardiovascular system: normal  S1/S2, RRR, +systolic murmur, no pedal edema.   Gastrointestinal system: soft, NT, ND, cholecystostomy tube in place Central nervous system: awake but drowsy, responds to voice, follows commands, normal  speech Extremities: moves all , no edema, normal tone Skin: dry, intact, normal temperature Psychiatry: normal mood & affect      Family Communication: Patient's step-daughter at bedside on rounds this AM.     Disposition: Status is: Inpatient   Time spent: 35 minutes     Data Reviewed: I reviewed gastroenterologist notes as well as previous endoscopy reports    Latest Ref Rng & Units 06/22/2023    4:40 AM 06/21/2023    2:21 PM 06/20/2023    5:20 AM  CBC  WBC 4.0 - 10.5 K/uL 9.2  8.1  12.3   Hemoglobin 13.0 - 17.0 g/dL 8.3  8.4  8.8   Hematocrit 39.0 - 52.0 % 27.5  27.1  29.5   Platelets 150 - 400 K/uL 142  155  122        Latest Ref Rng & Units 06/22/2023    4:40 AM 06/21/2023    2:21 PM 06/20/2023    5:20 AM  BMP  Glucose 70 - 99 mg/dL 962  952  91   BUN 8 - 23 mg/dL 43  45  35   Creatinine 0.61 - 1.24 mg/dL 8.41  3.24  4.01   Sodium 135 - 145 mmol/L 139  140  138   Potassium 3.5 - 5.1 mmol/L 3.5  3.7  2.9   Chloride 98 - 111 mmol/L 105  107  102   CO2 22 - 32 mmol/L 26  23  26    Calcium 8.9 - 10.3 mg/dL 02.7  25.3  9.6     Vitals:   06/24/23 1000 06/24/23 1546 06/24/23 1706 06/25/23 0821  BP: 123/65 (!) 80/53 (!) 116/45 127/79  Pulse: 74 78 75 76  Resp: 15 14 18 16   Temp:  97.7 F (36.5 C) (!) 97.1 F (36.2 C) (!) 97.1 F (36.2 C)  TempSrc:      SpO2: 100% (!) 83% 95% 96%  Weight:      Height:         Author: Pennie Banter, DO 06/25/2023 11:10 AM  For on call review [email or web address].

## 2023-06-25 NOTE — Progress Notes (Signed)
                                                                                                                                                                                                           Daily Progress Note   Patient Name: Justin [name]       Date: 06/25/2023 DOB: Oct 03, 1945  Age: 77 y.o. MRN#: [id number] Attending Physician: Pennie Banter, DO Primary Care Physician: System, Provider Not In Admit Date: 06/19/2023  Reason for Consultation/Follow-up: Establishing goals of care  HPI/Brief Hospital Review: 77 y.o. male  with past medical history of ESRD on HD, chronic hypotension, ischial osteomyelitis, DM2, HTN, HLD, a-fib- not on Eliquis due to GI bleeding/GIST (hospitalized 11/19-11/23 for significant blood loss), prostate ca, cholecystitis with biliary drain in place  admitted on 06/19/2023 with severe sepsis related to pneumonia. Recovery has been complicated by significant hypotension. He has not been tolerating his dialysis due to hypotension. His mental status is poor and currently he is declining medications, food and all interventions. Palliative medicine consulted for GOC.  Transitioned to comfort measures 12/12   Subjective: Extensive chart review has been completed prior to meeting patient including labs, vital signs, imaging, progress notes, orders, and available advanced directive documents from current and previous encounters.    Visited with Justin [name] at his bedside. He is awake, interacting with his step daughter at bedside, confused, unable to appropriately answer orientation questions. He appears comfortable without signs of discomfort or distress.  Review of MAR-not requiring frequent symptom management, administration of Robinul for secretions. Spoke with step daughter and explained at this time Justin [name] not likely to meet IPU criteria but will have hospice liaison engage over weekend for official assessment.  PMT will continue to follow for ongoing  needs and support.  Thank you for allowing the Palliative Medicine Team to assist in the care of this patient.  Total time:  25 minutes  Time spent includes: Detailed review of medical records (labs, imaging, vital signs), medically appropriate exam (mental status, respiratory, cardiac, skin), discussed with treatment team, counseling and educating patient, family and staff, documenting clinical information, medication management and coordination of care.  Leeanne Deed, DNP, AGNP-C Palliative Medicine   Please contact Palliative Medicine Team phone at [phone] for questions and concerns.

## 2023-06-25 NOTE — Care Management Important Message (Signed)
Important Message  Patient Details  Name: Justin [name] MRN: [id number] Date of Birth: May 06, 1946   Important Message Given:  Other (see comment)  Patient is on comfort care and may transfer to hospice home if appropriate move. Out of respect for the patient and family no Important Message from [facility] given.   [name] A [name] 06/25/2023, 9:59 AM

## 2023-06-26 DIAGNOSIS — R6520 Severe sepsis without septic shock: Secondary | ICD-10-CM | POA: Diagnosis not present

## 2023-06-26 DIAGNOSIS — G9341 Metabolic encephalopathy: Secondary | ICD-10-CM | POA: Diagnosis not present

## 2023-06-26 DIAGNOSIS — A419 Sepsis, unspecified organism: Secondary | ICD-10-CM | POA: Diagnosis not present

## 2023-06-26 DIAGNOSIS — N186 End stage renal disease: Secondary | ICD-10-CM | POA: Diagnosis not present

## 2023-06-26 DIAGNOSIS — Z515 Encounter for palliative care: Secondary | ICD-10-CM | POA: Diagnosis not present

## 2023-06-26 NOTE — Plan of Care (Signed)
  Problem: Fluid Volume: Goal: Hemodynamic stability will improve Outcome: Not Progressing   Problem: Clinical Measurements: Goal: Diagnostic test results will improve Outcome: Not Progressing Goal: Signs and symptoms of infection will decrease Outcome: Not Progressing   Problem: Respiratory: Goal: Ability to maintain adequate ventilation will improve Outcome: Not Progressing   Problem: Education: Goal: Knowledge of General Education information will improve Description: Including pain rating scale, medication(s)/side effects and non-pharmacologic comfort measures Outcome: Not Progressing   Problem: Health Behavior/Discharge Planning: Goal: Ability to manage health-related needs will improve Outcome: Not Progressing   

## 2023-06-26 NOTE — Progress Notes (Signed)
Daily Progress Note   Patient Name: Justin [name]       Date: 06/26/2023 DOB: 12-Jan-1946  Age: 77 y.o. MRN#: [id number] Attending Physician: Pennie Banter, DO Primary Care Physician: System, Provider Not In Admit Date: 06/19/2023  Reason for Consultation/Follow-up: Establishing goals of care  HPI/Brief Hospital Review: 77 y.o. male  with past medical history of ESRD on HD, chronic hypotension, ischial osteomyelitis, DM2, HTN, HLD, a-fib- not on Eliquis due to GI bleeding/GIST (hospitalized 11/19-11/23 for significant blood loss), prostate ca, cholecystitis with biliary drain in place  admitted on 06/19/2023 with severe sepsis related to pneumonia. Recovery has been complicated by significant hypotension. He has not been tolerating his dialysis due to hypotension. His mental status is poor and currently he is declining medications, food and all interventions. Palliative medicine consulted for GOC.   Transitioned to comfort measures 12/12  Subjective: Extensive chart review has been completed prior to meeting patient including labs, vital signs, imaging, progress notes, orders, and available advanced directive documents from current and previous encounters.    Visited with Justin [name] at his bedside, he is awake, alert, confused and unable to participate in conversations. Sisters and niece at bedside.  Spoke with family regarding disposition options. We reviewed that at this time Justin [name] is not eligible for hospice IPU. They share Justin [name] does not have a home to return to. We discussed the role of hospice following at a LTC facility for which they are interested in pursuing. Justin [name] arrived from a SNF in [place] but his family believe he was there for STR. They understand he will  need to transition to LTC.  TOC engaged to assist with disposition.  Justin [name] appears comfortable without apparent signs of distress. Continue with current medication regimen at this time.  Care plan was discussed with TOC and primary team.  Thank you for allowing the Palliative Medicine Team to assist in the care of this patient.  Total time:  25 minutes  Time spent includes: Detailed review of medical records (labs, imaging, vital signs), medically appropriate exam (mental status, respiratory, cardiac, skin), discussed with treatment team, counseling and educating patient, family and staff, documenting clinical information, medication management and coordination of care.  Leeanne Deed, DNP, AGNP-C Palliative Medicine   Please contact Palliative Medicine Team phone at [phone]  for questions and concerns.

## 2023-06-26 NOTE — Progress Notes (Signed)
Progress Note   Patient: Justin [name] ZOX:[id number] DOB: 10-04-1945 DOA: 06/19/2023     7 DOS: the patient was seen and examined on 06/26/2023   Brief hospital course: "MITCH SHILLINGTON is a 77 y.o. 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 ED course and data review: On arrival BP 96/60 downtrending to 81/45 for which he received a dose of midodrine.   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 Patient has been seen by gastroenterologist." See H&P for full HPI on admission & ED course.  Further hospital course and management as outlined below.  Hospital course complicated by hypotension which has limited ability to perform hemodialysis.  Palliative Care consulted for goals of care discussions.  Patient was transitioned to comfort care measures on 06/24/2023.  Given severity of acute illness and multiple underlying co-morbid conditions, and malnutrition, frail and deconditioned state, patient is very high risk for further morbidity and mortality despite all medical interventions that can be offered.         Assessment and Plan:   COMFORT CARE STATUS - as of 06/24/2023 Comfort orders placed by Palliative Care NP Notifiy provider if any signs on uncontrolled pain, restlessness, agitation or discomfort despite using ordered medications. Monitor clinical course with potential for d/c to  inpatient hospice if needed   -------------------------------------------------------------------------------------------- Below is assessment & plan prior to transitioning to comfort care:  Septic shock [facility]) Healthcare acquired pneumonia Presence of Biliary drain (exchanged 10/24) and tunneled RIJ dialysis catheter (placed 10/7) History of Klebsiella bacteremia 04/2023 History of ischial osteomyelitis 04/2023 s/p Fortaz Sepsis criteria include hypertension, low-grade temp tachycardia, hypotension and relative hypoxia with leukocytosis and lung infiltrate on CT Left lower lobe pneumonia seen on CT abdomen and pelvis Patient has a biliary drain and dialysis catheter which can also be potential sources of infection MRSA screen was negative, Vancomycin dc'd Continue cefepime Monitor for fluid overload given dialysis session with missed session on the day of arrival  Follow-up on culture results and if positive we will consider catheter removal Nephrology following  Not able to tolerate dialysis multiple attempts, limited by severe hypotension Intensivist consulted for pressors Initiated on Levophed 06/22/2023 Maintain MAP > 65    History of Klebsiella bacteremia 04/15/2023 Received Elita Quick during HD for 6 weeks.  Last received 12/3 Follows with ID   Gastric mass Previous imaging as well as current CT scan of the abdomen showing findings of gastric mass Gastroenterologist was consulted Patient underwent colonoscopy/EGD in June 2024 and gastric lesion was thought to be due to GIST. Gastroenterologist planning for outpatient follow-up with EUS and biopsies once clinically stable. Patient will follow-up at [facility] where they have endoscopic ultrasound capabilities if able to leave the hospital GI has signed off   Chronic hypotension Now with septic shock PCCM consulted, Levophed, maintain MAP > 65 Continue management as above   Acute metabolic encephalopathy Secondary to  sepsis CT head nonacute Diet resumed Continue  fall and aspiration precautions   Indwelling percutaneous cholecystostomy drain present History of cholecystitis, not a surgical candidate Percutaneous drain bag, draining well, with thick yellowish-green sludgy material Last exchanged 05/06/2023 Potential source of infection Follow-up on culture results   History of GI bleed Gastric varices, 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   No longer on Eliquis since GI bleed 05/2023   ESRD on hemodialysis [facility]) Nephrologist on board for dialysis needs   DM (diabetes mellitus) (HCC) Sliding scale insulin coverage Monitor glucose closely   Protein-calorie malnutrition, severe (HCC) Frailty-nonambulant at baseline Patient with 2 fairly recent prolonged hospitalizations in October and November 2024 Patient cachectic with temporal muscle wasting and prominence of bony eminences Dietary consulted Patient with significant weight loss with complex medical conditions with associated poor prognosis Palliative care have been consulted for goals of care discussion   History of prostate cancer No acute issues suspected   History of ischial osteomyelitis s/p treatment (HCC) Completed 8 weeks course of antibiotics and last seen by ID on 06/15/2023   Multiple pressure injuries of skin I agree with the wound descriptions as outlined below See wound care orders Continue frequent repositioning, offload pressure areas Pressure Injury 06/21/23 Heel Left;Posterior Unstageable - Full thickness tissue loss in which the base of the injury is covered by slough (yellow, tan, gray, green or brown) and/or eschar (tan, brown or black) in the wound bed. (Active)  06/21/23 0700  Location: Heel  Location Orientation: Left;Posterior  Staging: Unstageable - Full thickness tissue loss in which the base of the injury is covered  by slough (yellow, tan, gray, green or brown) and/or eschar (tan, brown or black) in the wound bed.  Wound Description (Comments):   Present on Admission: Yes     Pressure Injury 06/21/23 Heel Right Unstageable - Full thickness tissue loss in which the base of the injury is covered by slough (yellow, tan, gray, green or brown) and/or eschar (tan, brown or black) in the wound bed. (Active)  06/21/23 0700  Location: Heel  Location Orientation: Right  Staging: Unstageable - Full thickness tissue loss in which the base of the injury is covered by slough (yellow, tan, gray, green or brown) and/or eschar (tan, brown or black) in the wound bed.  Wound Description (Comments):   Present on Admission: Yes     Pressure Injury 06/20/23 Sacrum Mid Stage 2 -  Partial thickness loss of dermis presenting as a shallow open injury with a red, pink wound bed without slough. tiny 1/2 cm x 1/2 cm x 2 wounds. (Active)  06/20/23 1400  Location: Sacrum  Location Orientation: Mid  Staging: Stage 2 -  Partial thickness loss of dermis presenting as a shallow open injury with a red, pink wound bed without slough.  Wound Description (Comments): tiny 1/2 cm x 1/2 cm x 2 wounds.  Present on Admission: Yes         DVT prophylaxis: discontinued   Consults: Nephrology, Palliative Care, PCCM   Advance Care Planning:   Code Status: Prior     Disposition Plan: In hospital death versus discharge to inpatient hospice     Subjective:  Pt awake when seen this AM, step-daughter at bedside.  Pt more drowsy and sleepy this AM but responds to voice well.  Agreed to try eat some breakfast for her.   Physical Exam: General exam: sleeping but wakes to voice, no acute distress Respiratory  system: CTAB, no wheezes, rales or rhonchi, normal respiratory effort. Cardiovascular system: normal S1/S2, RRR, +systolic murmur, no pedal edema.   Gastrointestinal system: soft, NT, ND, cholecystostomy tube in place Central nervous  system: grossly non-focal,  follows commands, normal speech Extremities: moves all , no edema, normal tone Skin: dry, intact, normal temperature Psychiatry: normal mood & affect      Family Communication: Patient's step-daughter at bedside on rounds this AM.     Disposition: Status is: Inpatient   Time spent: 35 minutes     Data Reviewed: I reviewed gastroenterologist notes as well as previous endoscopy reports    Latest Ref Rng & Units 06/22/2023    4:40 AM 06/21/2023    2:21 PM 06/20/2023    5:20 AM  CBC  WBC 4.0 - 10.5 K/uL 9.2  8.1  12.3   Hemoglobin 13.0 - 17.0 g/dL 8.3  8.4  8.8   Hematocrit 39.0 - 52.0 % 27.5  27.1  29.5   Platelets 150 - 400 K/uL 142  155  122        Latest Ref Rng & Units 06/22/2023    4:40 AM 06/21/2023    2:21 PM 06/20/2023    5:20 AM  BMP  Glucose 70 - 99 mg/dL 409  811  91   BUN 8 - 23 mg/dL 43  45  35   Creatinine 0.61 - 1.24 mg/dL 9.14  7.82  9.56   Sodium 135 - 145 mmol/L 139  140  138   Potassium 3.5 - 5.1 mmol/L 3.5  3.7  2.9   Chloride 98 - 111 mmol/L 105  107  102   CO2 22 - 32 mmol/L 26  23  26    Calcium 8.9 - 10.3 mg/dL 21.3  08.6  9.6     Vitals:   06/24/23 1706 06/25/23 0821 06/26/23 0623 06/26/23 0756  BP: (!) 116/45 127/79 122/77 123/63  Pulse: 75 76 89 89  Resp: 18 16 16 16   Temp:  (!) 97.1 F (36.2 C) 97.6 F (36.4 C) (!) 97.5 F (36.4 C)  TempSrc:   Oral Oral  SpO2: 95% 96% 99% 99%  Weight:      Height:         Author: Pennie Banter, DO 06/26/2023 11:32 AM  For on call review [email or web address].

## 2023-06-27 DIAGNOSIS — N186 End stage renal disease: Secondary | ICD-10-CM | POA: Diagnosis not present

## 2023-06-27 DIAGNOSIS — E43 Unspecified severe protein-calorie malnutrition: Secondary | ICD-10-CM | POA: Diagnosis not present

## 2023-06-27 DIAGNOSIS — Z515 Encounter for palliative care: Secondary | ICD-10-CM | POA: Diagnosis not present

## 2023-06-27 DIAGNOSIS — R6520 Severe sepsis without septic shock: Secondary | ICD-10-CM | POA: Diagnosis not present

## 2023-06-27 DIAGNOSIS — A419 Sepsis, unspecified organism: Secondary | ICD-10-CM | POA: Diagnosis not present

## 2023-06-27 NOTE — Plan of Care (Signed)
  Problem: Fluid Volume: Goal: Hemodynamic stability will improve Outcome: Not Progressing   Problem: Clinical Measurements: Goal: Diagnostic test results will improve Outcome: Not Progressing Goal: Signs and symptoms of infection will decrease Outcome: Not Progressing   Problem: Respiratory: Goal: Ability to maintain adequate ventilation will improve Outcome: Not Progressing   Problem: Education: Goal: Knowledge of General Education information will improve Description: Including pain rating scale, medication(s)/side effects and non-pharmacologic comfort measures Outcome: Not Progressing   Problem: Health Behavior/Discharge Planning: Goal: Ability to manage health-related needs will improve Outcome: Not Progressing   Problem: Clinical Measurements: Goal: Ability to maintain clinical measurements within normal limits will improve Outcome: Not Progressing Goal: Will remain free from infection Outcome: Not Progressing Goal: Diagnostic test results will improve Outcome: Not Progressing Goal: Respiratory complications will improve Outcome: Not Progressing Goal: Cardiovascular complication will be avoided Outcome: Not Progressing   Problem: Activity: Goal: Risk for activity intolerance will decrease Outcome: Not Progressing   Problem: Nutrition: Goal: Adequate nutrition will be maintained Outcome: Not Progressing   Problem: Coping: Goal: Level of anxiety will decrease Outcome: Not Progressing   Problem: Elimination: Goal: Will not experience complications related to bowel motility Outcome: Not Progressing Goal: Will not experience complications related to urinary retention Outcome: Not Progressing   Problem: Pain Management: Goal: General experience of comfort will improve Outcome: Not Progressing   Problem: Safety: Goal: Ability to remain free from injury will improve Outcome: Not Progressing   Problem: Skin Integrity: Goal: Risk for impaired skin integrity  will decrease Outcome: Not Progressing   Problem: Education: Goal: Ability to describe self-care measures that may prevent or decrease complications (Diabetes Survival Skills Education) will improve Outcome: Not Progressing Goal: Individualized Educational Video(s) Outcome: Not Progressing   Problem: Coping: Goal: Ability to adjust to condition or change in health will improve Outcome: Not Progressing   Problem: Fluid Volume: Goal: Ability to maintain a balanced intake and output will improve Outcome: Not Progressing   Problem: Health Behavior/Discharge Planning: Goal: Ability to identify and utilize available resources and services will improve Outcome: Not Progressing Goal: Ability to manage health-related needs will improve Outcome: Not Progressing   Problem: Metabolic: Goal: Ability to maintain appropriate glucose levels will improve Outcome: Not Progressing   Problem: Nutritional: Goal: Maintenance of adequate nutrition will improve Outcome: Not Progressing Goal: Progress toward achieving an optimal weight will improve Outcome: Not Progressing   Problem: Skin Integrity: Goal: Risk for impaired skin integrity will decrease Outcome: Not Progressing   Problem: Tissue Perfusion: Goal: Adequacy of tissue perfusion will improve Outcome: Not Progressing   Problem: Education: Goal: Knowledge of the prescribed therapeutic regimen will improve Outcome: Not Progressing   Problem: Coping: Goal: Ability to identify and develop effective coping behavior will improve Outcome: Not Progressing   Problem: Clinical Measurements: Goal: Quality of life will improve Outcome: Not Progressing   Problem: Respiratory: Goal: Verbalizations of increased ease of respirations will increase Outcome: Not Progressing   Problem: Role Relationship: Goal: Family's ability to cope with current situation will improve Outcome: Not Progressing Goal: Ability to verbalize concerns, feelings,  and thoughts to partner or family member will improve Outcome: Not Progressing   Problem: Pain Management: Goal: Satisfaction with pain management regimen will improve Outcome: Not Progressing

## 2023-06-27 NOTE — Progress Notes (Signed)
Progress Note   Patient: Justin [name]:[id number] DOB: April 26, 1946 DOA: 06/19/2023     8 DOS: the patient was seen and examined on 06/27/2023   Brief hospital course: "INDI MOLINELLI is a 77 y.o. 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 ED course and data review: On arrival BP 96/60 downtrending to 81/45 for which he received a dose of midodrine.   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 Patient has been seen by gastroenterologist." See H&P for full HPI on admission & ED course.  Further hospital course and management as outlined below.  Hospital course complicated by hypotension which has limited ability to perform hemodialysis.  Palliative Care consulted for goals of care discussions.  Patient was transitioned to comfort care measures on 06/24/2023.  Given severity of acute illness and multiple underlying co-morbid conditions, and malnutrition, frail and deconditioned state, patient is very high risk for further morbidity and mortality despite all medical interventions that can be offered.         Assessment and Plan:   COMFORT CARE STATUS - as of 06/24/2023 Comfort orders placed by Palliative Care NP Notifiy provider if any signs on uncontrolled pain, restlessness, agitation or discomfort despite using ordered medications. Monitor clinical course with potential for d/c to  inpatient hospice if needed   -------------------------------------------------------------------------------------------- Below is assessment & plan prior to transitioning to comfort care:  Septic shock [facility]) Healthcare acquired pneumonia Presence of Biliary drain (exchanged 10/24) and tunneled RIJ dialysis catheter (placed 10/7) History of Klebsiella bacteremia 04/2023 History of ischial osteomyelitis 04/2023 s/p Fortaz Sepsis criteria include hypertension, low-grade temp tachycardia, hypotension and relative hypoxia with leukocytosis and lung infiltrate on CT Left lower lobe pneumonia seen on CT abdomen and pelvis Biliary drain and dialysis catheter which can also be potential sources of infection MRSA screen was negative, Vancomycin dc'd Continue cefepime Nephrology following  Not able to tolerate dialysis multiple attempts, limited by severe hypotension Intensivist consulted for pressors Initiated on Levophed 06/22/2023    History of Klebsiella bacteremia 04/15/2023 Received Elita Quick during HD for 6 weeks.  Last received 12/3 Follows with ID   Gastric mass Previous imaging as well as current CT scan of the abdomen showing findings of gastric mass Gastroenterologist was consulted, have signed off Patient underwent colonoscopy/EGD in June 2024 and gastric lesion was thought to be due to GIST. Gastroenterologist planning for outpatient follow-up with EUS and biopsies once clinically stable. Follow-up at [facility] where they have endoscopic ultrasound capabilities if able to leave the hospital.   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   No longer on Eliquis since GI bleed 05/2023   ESRD on hemodialysis [facility]) Nephrologist on board for dialysis needs   DM (diabetes mellitus) (HCC) Sliding scale insulin coverage Monitor glucose closely   Protein-calorie malnutrition, severe (HCC) Frailty-nonambulant at baseline Patient with 2 fairly recent prolonged hospitalizations in October and November 2024 Patient cachectic with temporal muscle wasting and prominence of bony eminences Dietary consulted Patient with significant weight loss with complex medical conditions with associated poor prognosis Palliative care have been consulted for goals of care discussion   History of prostate cancer No acute issues suspected   History of ischial osteomyelitis s/p treatment (HCC) Completed 8 weeks course of antibiotics and last seen by ID on 06/15/2023   Multiple pressure injuries of skin I agree with the wound descriptions as outlined below See wound care orders Continue frequent repositioning, offload pressure areas Pressure Injury 06/21/23 Heel Left;Posterior Unstageable - Full thickness tissue loss in which the base of the injury is covered by slough (yellow, tan, gray, green or brown) and/or eschar (tan, brown or black) in the wound bed. (Active)  06/21/23 0700  Location: Heel  Location Orientation: Left;Posterior  Staging: Unstageable - Full thickness tissue loss in which the base of the injury is covered by slough (yellow, tan, gray, green or brown) and/or eschar (tan, brown or black) in the wound bed.  Wound Description (Comments):   Present on Admission: Yes     Pressure Injury 06/21/23 Heel Right Unstageable - Full  thickness tissue loss in which the base of the injury is covered by slough (yellow, tan, gray, green or brown) and/or eschar (tan, brown or black) in the wound bed. (Active)  06/21/23 0700  Location: Heel  Location Orientation: Right  Staging: Unstageable - Full thickness tissue loss in which the base of the injury is covered by slough (yellow, tan, gray, green or brown) and/or eschar (tan, brown or black) in the wound bed.  Wound Description (Comments):   Present on Admission: Yes     Pressure Injury 06/20/23 Sacrum Mid Stage 2 -  Partial thickness loss of dermis presenting as a shallow open injury with a red, pink wound bed without slough. tiny 1/2 cm x 1/2 cm x 2 wounds. (Active)  06/20/23 1400  Location: Sacrum  Location Orientation: Mid  Staging: Stage 2 -  Partial thickness loss of dermis presenting as a shallow open injury with a red, pink wound bed without slough.  Wound Description (Comments): tiny 1/2 cm x 1/2 cm x 2 wounds.  Present on Admission: Yes         DVT prophylaxis: discontinued   Consults: Nephrology, Palliative Care, PCCM   Advance Care Planning:   Code status -- DNR / comfort care status    Disposition Plan: Likely needs LTC placement for ongoing hospice care. Currently not eligible for inpatient hospice facility.     Subjective:  Pt sleeping when seen this AM, no family present at the time.  No acute events reported.  Pt arouses just briefly, does not express complaints.   Physical Exam: General exam: somnolent, no acute distress Respiratory system: CTAB, no wheezes, rales or rhonchi, normal respiratory effort. Cardiovascular system: normal S1/S2, RRR, +systolic murmur, no pedal edema.   Gastrointestinal system: soft, NT, ND, cholecystostomy tube in place Central nervous system: limited exam  due to somnolence, grossly non-focal Extremities: moves all , no edema, normal tone Skin: dry, intact, normal temperature Psychiatry: limited exam due to  somnolence      Family Communication: Patient's step-daughter at bedside on rounds 12/14. None present on rounds today. No new medical updates at this time.     Disposition: Status is: Inpatient   Time spent: 35 minutes     Data Reviewed: I reviewed gastroenterologist notes as well as previous endoscopy reports    Latest Ref Rng & Units 06/22/2023    4:40 AM 06/21/2023    2:21 PM 06/20/2023    5:20 AM  CBC  WBC 4.0 - 10.5 K/uL 9.2  8.1  12.3   Hemoglobin 13.0 - 17.0 g/dL 8.3  8.4  8.8   Hematocrit 39.0 - 52.0 % 27.5  27.1  29.5   Platelets 150 - 400 K/uL 142  155  122        Latest Ref Rng & Units 06/22/2023    4:40 AM 06/21/2023    2:21 PM 06/20/2023    5:20 AM  BMP  Glucose 70 - 99 mg/dL 629  528  91   BUN 8 - 23 mg/dL 43  45  35   Creatinine 0.61 - 1.24 mg/dL 4.13  2.44  0.10   Sodium 135 - 145 mmol/L 139  140  138   Potassium 3.5 - 5.1 mmol/L 3.5  3.7  2.9   Chloride 98 - 111 mmol/L 105  107  102   CO2 22 - 32 mmol/L 26  23  26    Calcium 8.9 - 10.3 mg/dL 27.2  53.6  9.6     Vitals:   06/26/23 0756 06/26/23 1208 06/26/23 1552 06/27/23 0622  BP: 123/63 134/73 116/65 120/68  Pulse: 89 80 72 75  Resp: 16 16 16 18   Temp: (!) 97.5 F (36.4 C) 97.6 F (36.4 C) 98.2 F (36.8 C) 98.3 F (36.8 C)  TempSrc: Oral Axillary Axillary Axillary  SpO2: 99% 100% 100% 99%  Weight:      Height:         Author: Pennie Banter, DO 06/27/2023 10:23 AM  For on call review [email or web address].

## 2023-06-27 NOTE — TOC Progression Note (Signed)
Transition of Care [facility]) - Progression Note    Patient Details  Name: Justin [name] MRN: [id number] Date of Birth: [birth year]  Transition of Care [facility]) CM/SW Contact  Rodney Langton, RN Phone Number: 06/27/2023, 4:54 PM  Clinical Narrative:     Patient does not meet criteria for hospice IPU, plan to discharge to SNF with hospice support.  Spoke with niece, [name], agrees and does not have preference.  Bed search initiated.   Expected Discharge Plan: Skilled Nursing Facility Barriers to Discharge: Continued Medical Work up  Expected Discharge Plan and Services                                               Social Determinants of Health (SDOH) Interventions SDOH Screenings   Food Insecurity: No Food Insecurity (06/20/2023)  Recent Concern: Food Insecurity - Food Insecurity Present (04/13/2023)  Housing: Patient Unable To Answer (06/25/2023)  Transportation Needs: Patient Unable To Answer (06/20/2023)  Recent Concern: Transportation Needs - Unmet Transportation Needs (06/01/2023)  Utilities: Not At Risk (06/20/2023)  Social Connections: Unknown (10/13/2022)   Received from [facility], [facility]  Stress: No Stress Concern Present (10/14/2022)   Received from [facility], [facility]  Tobacco Use: Medium Risk (06/19/2023)    Readmission Risk Interventions    06/02/2023    2:16 PM 03/30/2023    2:29 PM 11/14/2022   10:08 AM  Readmission Risk Prevention Plan  Transportation Screening Complete Complete Complete  Medication Review (RN Care Manager) Complete Referral to Pharmacy Complete  PCP or Specialist appointment within 3-5 days of discharge Not Complete Complete Complete  HRI or Home Care Consult Complete Complete Complete  SW Recovery Care/Counseling Consult Complete Complete Complete  Palliative Care Screening Not Applicable Not Applicable Not Applicable  Skilled Nursing Facility Not Applicable Not Applicable Complete

## 2023-06-27 NOTE — Progress Notes (Signed)
                                                                                                                                                                                                           Daily Progress Note   Patient Name: Justin [name]       Date: 06/27/2023 DOB: [birth year]  Age: 77 y.o. MRN#: [id number] Attending Physician: Pennie Banter, DO Primary Care Physician: System, Provider Not In Admit Date: 06/19/2023  Reason for Consultation/Follow-up: Establishing goals of care  HPI/Brief Hospital Review: 77 y.o. male  with past medical history of ESRD on HD, chronic hypotension, ischial osteomyelitis, DM2, HTN, HLD, a-fib- not on Eliquis due to GI bleeding/GIST (hospitalized 11/19-11/23 for significant blood loss), prostate ca, cholecystitis with biliary drain in place  admitted on 06/19/2023 with severe sepsis related to pneumonia. Recovery has been complicated by significant hypotension. He has not been tolerating his dialysis due to hypotension. His mental status is poor and currently he is declining medications, food and all interventions. Palliative medicine consulted for GOC.   Transitioned to comfort measures 12/12  Subjective: Extensive chart review has been completed prior to meeting patient including labs, vital signs, imaging, progress notes, orders, and available advanced directive documents from current and previous encounters.    Visited with Justin [name] at his bedside. He is awake in bed, disoriented and fidgeting with sheets. He becomes more calm when his sisters arrive at bedside. Discussed the role of comfort medications with his sisters and step daughter at bedside, medications used for pain, anxiety, agitation as needed to ensure comfort. At this time, family does not want to utilize medication as they feel he is responding well to their presence. Encouraged family to notify nursing staff if comfort medications needed.  Answered and addressed all questions and  concerns.  Noted decline in mentation from yesterday. May be appropriate to be evaluated for IPU early next week-HL aware. TOC also engaged to initiate bed search for LTC placement with hospice following.  Thank you for allowing the Palliative Medicine Team to assist in the care of this patient.  Total time:  25 minutes  Time spent includes: Detailed review of medical records (labs, imaging, vital signs), medically appropriate exam (mental status, respiratory, cardiac, skin), discussed with treatment team, counseling and educating patient, family and staff, documenting clinical information, medication management and coordination of care.  Leeanne Deed, DNP, AGNP-C Palliative Medicine   Please contact Palliative Medicine Team phone at [phone] for questions and concerns.

## 2023-06-27 NOTE — [place] FL2 (Signed)
[place] MEDICAID FL2 LEVEL OF CARE FORM     IDENTIFICATION  Patient Name: Justin [name] Birthdate: 20-Jun-1946 Sex: male Admission Date (Current Location): 06/19/2023  [place] and [place] Number:  Chiropodist and Address:  [facility], [address], [place], [place] [address]      Provider Number: [id number]  Attending Physician Name and Address:  Pennie Banter, DO  Relative Name and Phone Number:  Niece [name] [phone]    Current Level of Care: Hospital Recommended Level of Care: Skilled Nursing Facility, Other (Comment) (hospice) Prior Approval Number:    Date Approved/Denied:   PASRR Number: [id number]  Discharge Plan: SNF (hospice)    Current Diagnoses: Patient Active Problem List   Diagnosis Date Noted   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   Pressure injury of skin 06/21/2023   Acute metabolic encephalopathy 06/20/2023   Protein-calorie malnutrition, severe (HCC) 06/20/2023   HCAP (healthcare-associated pneumonia) 06/20/2023   Indwelling percutaneous cholecystostomy drain present 06/20/2023   History of Klebsiella bacteremia 04/15/2023 06/20/2023   History of ischial osteomyelitis s/p treatment (HCC) 06/20/2023   Severe sepsis (HCC) 06/19/2023   Occult blood positive stool 06/02/2023   Hypoglycemia 06/01/2023   ESRD (end stage renal disease) (HCC) 06/01/2023   Coagulopathy (HCC) 06/01/2023   Chronic hypotension 06/01/2023   History of GI bleed 04/13/2023   Acute hip pain, left 04/13/2023   SIRS (systemic inflammatory response syndrome) (HCC) 04/13/2023   Ambulatory dysfunction 04/13/2023   History of cholecystitis 04/13/2023   Dysphagia 04/13/2023   Shock (HCC) 12/22/2022   Rectal ulcer 12/17/2022   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   Hemorrhagic shock (HCC) 12/14/2022   Acute encephalopathy 11/09/2022   Syncope 11/09/2022   Acute blood loss anemia 11/09/2022   Melena 11/09/2022   History of  colonic polyps 11/09/2022   End stage renal disease (HCC) 11/09/2022   Gastric mass, suspect GIST (EGD 12/2022) 11/09/2022   Rectal bleeding 09/17/2022   Colon ulcer 09/17/2022   Lower GI bleeding 09/17/2022   GI bleed 09/16/2022   Prolonged QT interval 09/16/2022   History of prostate cancer 09/16/2022   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   History of colon polyps 09/16/2022   Lower GI bleed 09/12/2022   Acute GI bleeding 09/10/2022   Wide-complex tachycardia 06/17/2022   Hypotension 06/13/2022   Obesity (BMI 30-39.9) 06/13/2022   Perirectal abscess 06/13/2022   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   Murmur, cardiac 01/27/2022   Chronic back pain 01/27/2022   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/27/2022   GERD (gastroesophageal reflux disease) 11/11/2021   High risk medication use 11/11/2021   History of cardioversion 11/11/2021   Paroxysmal atrial fibrillation with RVR (HCC) 11/11/2021   PVC (premature ventricular contraction) 11/11/2021   Nonrheumatic aortic valve stenosis 11/06/2021   Mixed hyperlipidemia 11/06/2021   Abdominal mass 05/05/2021   First degree AV block 07/13/2020   Paroxysmal A-fib (HCC) 01/29/2020   Wound infection 01/28/2020   H/O vitrectomy 12/07/2019   Macular pucker, 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 determined by examination associated with type 2 diabetes mellitus (HCC) 10/24/2019   Stable treated proliferative diabetic retinopathy of left eye determined by examination associated with type 2 diabetes mellitus (HCC) 10/24/2019   ESRD on hemodialysis (HCC) 07/29/2017   Anticoagulated 07/13/2017   Malignant neoplasm of prostate (HCC) 07/12/2017   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   Coagulation defect, unspecified (HCC) 01/08/2017  Idiopathic gout 01/08/2017   Acute on chronic blood loss anemia 11/20/2015   Blurred vision, left eye 07/10/2011    Essential hypertension 07/10/2011   DM (diabetes mellitus) (HCC) 07/10/2011    Orientation RESPIRATION BLADDER Height & Weight        Normal  (anuric) Weight: 63 kg Height:  5\' 10"  (177.8 cm)  BEHAVIORAL SYMPTOMS/MOOD NEUROLOGICAL BOWEL NUTRITION STATUS      Incontinent Diet  AMBULATORY STATUS COMMUNICATION OF NEEDS Skin   Total Care   PU Stage and Appropriate Care   PU Stage 2 Dressing: Daily                   Personal Care Assistance Level of Assistance  Bathing, Feeding, Dressing, Total care Bathing Assistance: Maximum assistance Feeding assistance: Maximum assistance Dressing Assistance: Maximum assistance Total Care Assistance: Maximum assistance   Functional Limitations Info             SPECIAL CARE FACTORS FREQUENCY   (hospice)                    Contractures Contractures Info: Not present    Additional Factors Info  Code Status, Allergies Code Status Info: DNR Allergies Info: NKA           Current Medications (06/27/2023):  This is the current hospital active medication list Current Facility-Administered Medications  Medication Dose Route Frequency Provider Last Rate Last Admin   acetaminophen (TYLENOL) tablet 650 mg  650 mg Oral Q6H PRN Andris Baumann, MD       Or   acetaminophen (TYLENOL) suppository 650 mg  650 mg Rectal Q6H PRN Andris Baumann, MD       glycopyrrolate (ROBINUL) tablet 1 mg  1 mg Oral Q4H PRN Barbara Cower, NP       Or   glycopyrrolate (ROBINUL) injection 0.2 mg  0.2 mg Subcutaneous Q4H PRN Barbara Cower, NP       Or   glycopyrrolate (ROBINUL) injection 0.2 mg  0.2 mg Intravenous Q4H PRN Barbara Cower, NP   0.2 mg at 06/27/23 0058   haloperidol (HALDOL) tablet 0.5 mg  0.5 mg Oral Q4H PRN Barbara Cower, NP       Or   haloperidol (HALDOL) 2 MG/ML solution 0.5 mg  0.5 mg Sublingual Q4H PRN Barbara Cower, NP       Or   haloperidol lactate (HALDOL) injection 0.5 mg  0.5 mg Intravenous Q4H PRN Barbara Cower, NP        HYDROmorphone (DILAUDID) injection 0.5 mg  0.5 mg Intravenous Q15 min PRN Barbara Cower, NP       LORazepam (ATIVAN) tablet 1 mg  1 mg Oral Q4H PRN Barbara Cower, NP       Or   LORazepam (ATIVAN) 2 MG/ML concentrated solution 1 mg  1 mg Sublingual Q4H PRN Barbara Cower, NP       Or   LORazepam (ATIVAN) injection 1 mg  1 mg Intravenous Q4H PRN Barbara Cower, NP       ondansetron (ZOFRAN-ODT) disintegrating tablet 4 mg  4 mg Oral Q6H PRN Barbara Cower, NP       Or   ondansetron (ZOFRAN) injection 4 mg  4 mg Intravenous Q6H PRN Barbara Cower, NP       Oral care mouth rinse  15 mL Mouth Rinse PRN Esaw Grandchild A, DO       polyvinyl alcohol (  LIQUIFILM TEARS) 1.4 % ophthalmic solution 1 drop  1 drop Both Eyes QID PRN Barbara Cower, NP         Discharge Medications: Please see discharge summary for a list of discharge medications.  Relevant Imaging Results:  Relevant Lab Results:   Additional Information SSN: [id number], plan for LTC SNF with hospice support  Rodney Langton, RN

## 2023-06-28 DIAGNOSIS — A419 Sepsis, unspecified organism: Secondary | ICD-10-CM | POA: Diagnosis not present

## 2023-06-28 DIAGNOSIS — N186 End stage renal disease: Secondary | ICD-10-CM | POA: Diagnosis not present

## 2023-06-28 DIAGNOSIS — R4182 Altered mental status, unspecified: Secondary | ICD-10-CM | POA: Diagnosis not present

## 2023-06-28 DIAGNOSIS — R6520 Severe sepsis without septic shock: Secondary | ICD-10-CM | POA: Diagnosis not present

## 2023-06-28 DIAGNOSIS — J189 Pneumonia, unspecified organism: Secondary | ICD-10-CM | POA: Diagnosis not present

## 2023-06-28 NOTE — Plan of Care (Signed)
  Problem: Fluid Volume: Goal: Hemodynamic stability will improve Outcome: Progressing   Problem: Clinical Measurements: Goal: Diagnostic test results will improve Outcome: Progressing Goal: Signs and symptoms of infection will decrease Outcome: Progressing   Problem: Respiratory: Goal: Ability to maintain adequate ventilation will improve Outcome: Progressing   Problem: Education: Goal: Knowledge of General Education information will improve Description: Including pain rating scale, medication(s)/side effects and non-pharmacologic comfort measures Outcome: Progressing   Problem: Health Behavior/Discharge Planning: Goal: Ability to manage health-related needs will improve Outcome: Progressing   Problem: Clinical Measurements: Goal: Ability to maintain clinical measurements within normal limits will improve Outcome: Progressing Goal: Will remain free from infection Outcome: Progressing Goal: Diagnostic test results will improve Outcome: Progressing Goal: Respiratory complications will improve Outcome: Progressing Goal: Cardiovascular complication will be avoided Outcome: Progressing   Problem: Activity: Goal: Risk for activity intolerance will decrease Outcome: Progressing   Problem: Nutrition: Goal: Adequate nutrition will be maintained Outcome: Progressing   Problem: Coping: Goal: Level of anxiety will decrease Outcome: Progressing   Problem: Elimination: Goal: Will not experience complications related to bowel motility Outcome: Progressing Goal: Will not experience complications related to urinary retention Outcome: Progressing   Problem: Pain Management: Goal: General experience of comfort will improve Outcome: Progressing   Problem: Safety: Goal: Ability to remain free from injury will improve Outcome: Progressing   Problem: Skin Integrity: Goal: Risk for impaired skin integrity will decrease Outcome: Progressing   Problem: Education: Goal:  Ability to describe self-care measures that may prevent or decrease complications (Diabetes Survival Skills Education) will improve Outcome: Progressing Goal: Individualized Educational Video(s) Outcome: Progressing   Problem: Coping: Goal: Ability to adjust to condition or change in health will improve Outcome: Progressing   Problem: Fluid Volume: Goal: Ability to maintain a balanced intake and output will improve Outcome: Progressing   Problem: Health Behavior/Discharge Planning: Goal: Ability to identify and utilize available resources and services will improve Outcome: Progressing Goal: Ability to manage health-related needs will improve Outcome: Progressing   Problem: Metabolic: Goal: Ability to maintain appropriate glucose levels will improve Outcome: Progressing   Problem: Nutritional: Goal: Maintenance of adequate nutrition will improve Outcome: Progressing Goal: Progress toward achieving an optimal weight will improve Outcome: Progressing   Problem: Skin Integrity: Goal: Risk for impaired skin integrity will decrease Outcome: Progressing   Problem: Tissue Perfusion: Goal: Adequacy of tissue perfusion will improve Outcome: Progressing   Problem: Education: Goal: Knowledge of the prescribed therapeutic regimen will improve Outcome: Progressing   Problem: Coping: Goal: Ability to identify and develop effective coping behavior will improve Outcome: Progressing   Problem: Clinical Measurements: Goal: Quality of life will improve Outcome: Progressing   Problem: Respiratory: Goal: Verbalizations of increased ease of respirations will increase Outcome: Progressing   Problem: Role Relationship: Goal: Family's ability to cope with current situation will improve Outcome: Progressing Goal: Ability to verbalize concerns, feelings, and thoughts to partner or family member will improve Outcome: Progressing   Problem: Pain Management: Goal: Satisfaction with pain  management regimen will improve Outcome: Progressing

## 2023-06-28 NOTE — Progress Notes (Signed)
Palliative Care Progress Note, Assessment & Plan   Patient Name: Justin [name]       Date: 06/28/2023 DOB: 22-Oct-1945  Age: 77 y.o. MRN#: [id number] Attending Physician: Gillis Santa, MD Primary Care Physician: System, Provider Not In Admit Date: 06/19/2023  Subjective: Patient is lying in bed, sleeping, and easily awakens to physical touch.  He does not open his eyes but moves his arms to acknowledge my presence.  His sister, stepdaughter, and other family are at bedside during my visit.  HPI: 77 y.o. male  with past medical history of ESRD on HD, chronic hypotension, ischial osteomyelitis, DM2, HTN, HLD, a-fib- not on Eliquis due to GI bleeding/GIST (hospitalized 11/19-11/23 for significant blood loss), prostate ca, cholecystitis with biliary drain in place  admitted on 06/19/2023 with severe sepsis related to pneumonia. Recovery has been complicated by significant hypotension. He has not been tolerating his dialysis due to hypotension. His mental status is poor and currently he is declining medications, food and all interventions. Palliative medicine consulted for GOC.   Transitioned to comfort measures 12/12  Summary of counseling/coordination of care: Extensive chart review completed prior to meeting patient including labs, vital signs, imaging, progress notes, orders, and available advanced directive documents from current and previous encounters.   After reviewing the patient's chart and assessing the patient at bedside, I discussed symptom management and plan of care with patient and family.  Symptoms assessed.  Patient has no acute complaints at this time.  Family shares patient has been awake and speaking with them this morning.  He has had no complaint of pain or other issues with them.  No  adjustment to [facility] needed at this time  Highlighted that medications remain available as needed for for patient if he should experience agitation, anxiety, pain, or other end-of-life symptoms.  Family was appreciative.  Full comfort measures to continue.  Space and opportunity offered for family to ask questions.  They ask where patient will be going after discharge.  I assured him that [facility] is following closely for discharge planning.  PMT will continue to follow and support patient throughout his hospitalization. Physical Exam Vitals reviewed.  Constitutional:      General: He is not in acute distress.    Appearance: He is normal weight.  HENT:     Head: Normocephalic.     Nose: Nose normal.     Mouth/Throat:     Mouth: Mucous membranes are moist.  Pulmonary:     Effort: Pulmonary effort is normal.  Abdominal:     Palpations: Abdomen is soft.  Musculoskeletal:     Comments: Generalized weakness  Skin:    General: Skin is warm and dry.  Psychiatric:        Mood and Affect: Mood normal.        Behavior: Behavior normal.        Judgment: Judgment normal.             Total Time 25 minutes   Time spent includes: Detailed review of medical records (labs, imaging, vital signs), medically appropriate exam (mental status, respiratory, cardiac, skin), discussed with treatment team, counseling and educating patient, family and staff, documenting clinical information, medication management  and coordination of care.  Samara Deist L. Bonita Quin, DNP, FNP-BC Palliative Medicine Team

## 2023-06-28 NOTE — Progress Notes (Signed)
[facility] Progress Note  Patient: Justin [name]    QMV:[id number]  DOA: 06/19/2023     Date of Service: the patient was seen and examined on 06/28/2023  Chief Complaint  Patient presents with   Altered Mental Status   Brief hospital course: "BIRCH SCHETTER is a 77 y.o. 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 ED course and data review: On arrival BP 96/60 downtrending to 81/45 for which he received a dose of midodrine.   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 Patient has been seen by gastroenterologist." See H&P for full HPI on admission & ED course.   Further hospital course and management as outlined below.   Hospital course complicated by hypotension which has limited ability to perform hemodialysis.  Palliative Care consulted for goals of care discussions.  Patient was transitioned to comfort care measures on 06/24/2023.   Given severity of acute illness and multiple underlying co-morbid conditions, and malnutrition, frail and deconditioned state, patient is very high risk for further morbidity and mortality despite all medical interventions that can be offered.      Assessment and Plan: COMFORT CARE STATUS - as of 06/24/2023 Comfort orders placed by Palliative Care NP Notifiy provider if any signs on uncontrolled pain, restlessness, agitation or discomfort despite  using ordered medications. Monitor clinical course with potential for d/c to inpatient hospice if needed     -------------------------------------------------------------------------------------------- Below is assessment & plan prior to transitioning to comfort care:   # Septic shock  Healthcare acquired pneumonia Presence of Biliary drain (exchanged 10/24) and tunneled RIJ dialysis catheter (placed 10/7) History of Klebsiella bacteremia 04/2023 History of ischial osteomyelitis 04/2023 s/p Fortaz Sepsis criteria include hypertension, low-grade temp tachycardia, hypotension and relative hypoxia with leukocytosis and lung infiltrate on CT Left lower lobe pneumonia seen on CT abdomen and pelvis Biliary drain and dialysis catheter which can also be potential sources of infection MRSA screen was negative, Vancomycin dc'd Continue cefepime Nephrology following  Not able to tolerate dialysis multiple attempts, limited by severe hypotension Intensivist consulted for pressors,s/p Levophed 06/22/2023   # History of Klebsiella bacteremia 04/15/2023 Received Elita Quick during HD for 6 weeks.  Last received 12/3, ID was consulted   # Gastric mass Previous imaging as well as current CT scan of the abdomen showing findings of gastric mass Gastroenterologist was consulted, have signed off Patient underwent colonoscopy/EGD in June 2024 and gastric lesion was thought to be due to GIST. Gastroenterologist planning for outpatient follow-up with EUS and biopsies once clinically stable. Follow-up at [facility] where they have endoscopic ultrasound capabilities if able to leave the hospital.   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  Nutrition Problem: Severe Malnutrition Etiology: chronic illness Interventions:   Pressure Injury 06/21/23 Heel Left;Posterior Unstageable - Full thickness tissue loss in which the base of the injury is covered by slough (yellow, tan, gray, green or brown) and/or eschar (tan, brown or black) in the wound bed. (Active)  06/21/23 0700  Location: Heel  Location Orientation: Left;Posterior  Staging: Unstageable - Full thickness tissue loss in which the base of the injury is covered by slough (yellow, tan, gray, green or brown) and/or eschar (tan, brown or black) in the wound bed.  Wound Description (Comments):   Present on Admission: Yes  Dressing Type Gauze (Comment) 06/27/23 1940     Pressure Injury 06/21/23 Heel Right  Unstageable - Full thickness tissue loss in which the base of the injury is covered by slough (yellow, tan, gray, green or brown) and/or eschar (tan, brown or black) in the wound bed. (Active)  06/21/23 0700  Location: Heel  Location Orientation: Right  Staging: Unstageable - Full thickness tissue loss in which the base of the injury is covered by slough (yellow, tan, gray, green or brown) and/or eschar (tan, brown or black) in the wound bed.  Wound Description (Comments):   Present on Admission: Yes  Dressing Type Gauze (Comment) 06/27/23 1940     Pressure Injury 06/20/23 Sacrum Mid Stage 2 -  Partial thickness loss of dermis presenting as a shallow open injury with a red, pink wound bed without slough. tiny 1/2 cm x 1/2 cm x 2 wounds. (Active)  06/20/23 1400  Location: Sacrum  Location Orientation: Mid  Staging: Stage 2 -  Partial thickness loss of dermis presenting as a shallow open injury with a red, pink wound bed without slough.  Wound Description (Comments): tiny 1/2 cm x 1/2 cm x 2 wounds.  Present on Admission: Yes  Dressing Type Foam - Lift dressing to assess site every shift 06/27/23 1940     Diet: Regular diet DVT Prophylaxis: Comfort care  Advance goals of care discussion: DNR-comfort  Family Communication: family was present at bedside, at the time of interview.  The pt provided permission to discuss medical plan with the family. Opportunity was given to ask question and all questions were answered satisfactorily.   Disposition:  Pt is from Home, admitted with sepsis, transition to comfort care patient's 06/24/2023 Discharge to hospice, when bed will be available. Likely needs LTC placement for ongoing hospice care. Currently not eligible for inpatient hospice facility.    Subjective:  No significant events overnight, patient was resting comfortably.  Did not offer any complaints.  Physical Exam: General: NAD, lying comfortably Appear in no distress, affect  appropriate Eyes: PERRLA ENT: Oral Mucosa Clear, dry Neck: no JVD,  Cardiovascular: S1 and S2 Present, no Murmur,  Respiratory: good respiratory effort, Bilateral Air entry equal and Decreased, no Crackles, no wheezes Abdomen: Bowel Sound present, Soft and no tenderness,  Skin: no rashes Extremities: no Pedal edema, no calf tenderness Neurologic: without any new focal findings Gait not checked due to patient safety concerns  Vitals:   06/26/23 1552 06/27/23 0622 06/27/23 1326 06/28/23 1147  BP: 116/65 120/68 96/60 (!) 85/49  Pulse: 72 75 84 81  Resp: 16 18 20 16   Temp: 98.2 F (36.8 C) 98.3 F (36.8 C) 97.9 F (36.6 C) 98.2 F (36.8 C)  TempSrc: Axillary Axillary    SpO2: 100% 99% 99% 100%  Weight:      Height:       No intake or output data in the 24 hours ending 06/28/23 1326 Filed Weights   06/21/23 1346 06/22/23 0444 06/22/23 1013  Weight: 62.9 kg 62.9 kg 63 kg    Data Reviewed: I have personally reviewed and interpreted daily labs, tele strips, imagings as discussed above. I reviewed all nursing notes, pharmacy notes, vitals, pertinent old records I have discussed plan of care as described above with RN and patient/family.  CBC: Recent Labs  Lab 06/21/23 1421 06/22/23 0440  WBC 8.1 9.2  NEUTROABS 6.5 7.5  HGB 8.4* 8.3*  HCT 27.1* 27.5*  MCV 98.9 98.6  PLT 155 142*   Basic Metabolic Panel: Recent Labs  Lab 06/21/23 1421 06/22/23 0440  NA 140 139  K 3.7 3.5  CL 107 105  CO2 23 26  GLUCOSE 125* 105*  BUN 45* 43*  CREATININE 6.94* 6.53*  CALCIUM 10.2 10.1    Studies: No results found.  Scheduled Meds: Continuous Infusions: PRN Meds: acetaminophen **OR** acetaminophen, glycopyrrolate **OR** glycopyrrolate **OR** glycopyrrolate, haloperidol **OR** haloperidol **OR** haloperidol lactate, HYDROmorphone (DILAUDID) injection, LORazepam **OR** LORazepam **OR** LORazepam, ondansetron **OR** ondansetron (ZOFRAN) IV, mouth rinse, polyvinyl alcohol  Time  spent: 25 minutes  Author: Gillis Santa. MD [facility] 06/28/2023 1:26 PM  To reach On-call, see care teams to locate the attending and reach out to them via [email or web address]. If 7PM-7AM, please contact night-coverage If you still have difficulty reaching the attending provider, please page the [facility] (Director on Call) for [facility] on amion for assistance.

## 2023-07-12 ENCOUNTER — Ambulatory Visit (HOSPITAL_COMMUNITY): Admission: RE | Admit: 2023-07-12 | Payer: Medicare Other | Source: Home / Self Care | Admitting: Gastroenterology

## 2023-07-12 SURGERY — COLONOSCOPY WITH PROPOFOL
Anesthesia: Monitor Anesthesia Care

## 2023-07-14 NOTE — Progress Notes (Signed)
    OVERNIGHT PROGRESS REPORT  Notified by RN that patient is deceased as of 07/12/23 0327  Patient was comfort care  2 RN verified.  Family notified by RN     Helmut Muster, MSN, APRN, AGACNP-BC [facility] [place] Pager: [phone]. Check Amion for Availability

## 2023-07-14 NOTE — Death Summary Note (Signed)
DEATH SUMMARY   Patient Details  Name: Justin [name] MRN: [id number] DOB: February 01, 1946 YQM:VHQION, Provider Not In Admission/Discharge Information   Admit Date:  07/13/2023  Date of Death: Date of Death: 07/23/2023  Time of Death: Time of Death: 0327  Length of Stay: 10   Principle Cause of death:   Healthcare acquired pneumonia And  Septic shock  Hospital Diagnoses: Principal Problem:   Severe sepsis (HCC) Active Problems:   Chronic hypotension   HCAP (healthcare-associated pneumonia)   History of Klebsiella bacteremia 04/15/2023   Acute metabolic encephalopathy   Indwelling percutaneous cholecystostomy drain present   History of GI bleed   Gastric mass, suspect GIST (EGD 12/2022)   Gastric varices   ESRD on hemodialysis (HCC)   Paroxysmal A-fib (HCC)   DM (diabetes mellitus) (HCC)   Protein-calorie malnutrition, severe (HCC)   History of prostate cancer   Hypotension   History of ischial osteomyelitis s/p treatment (HCC)   Gastrointestinal stromal tumor (GIST) (HCC)   Pressure injury of skin   Hospital Course: "Justin [name] is a 78 y.o. 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 ED course and data review: On arrival BP 96/60 downtrending to 81/45 for which he received a dose of midodrine.   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 Patient has been seen by  gastroenterologist." See H&P for full HPI on admission & ED course.   Further hospital course and management as outlined below.   Hospital course complicated by hypotension which has limited ability to perform hemodialysis.  Palliative Care consulted for goals of care discussions.  Patient was transitioned to comfort care measures on 06/24/2023.   Given severity of acute illness and multiple underlying co-morbid conditions, and malnutrition, frail and deconditioned state, patient is very high risk for further morbidity and mortality despite all medical interventions that can be offered.       Assessment and Plan: COMFORT CARE STATUS - as of 06/24/2023 Comfort orders placed by Palliative Care NP Notifiy provider if any signs on uncontrolled pain, restlessness, agitation or discomfort despite using ordered medications. Monitor clinical course with potential for d/c to inpatient hospice if needed     -------------------------------------------------------------------------------------------- Below is assessment & plan prior to transitioning to comfort care:   # Septic shock  Healthcare acquired pneumonia Presence of Biliary drain (exchanged 10/24) and tunneled RIJ dialysis catheter (placed 10/7) History of Klebsiella bacteremia 04/2023 History of ischial osteomyelitis 04/2023 s/p Fortaz Sepsis criteria include hypertension, low-grade temp tachycardia, hypotension and relative hypoxia with leukocytosis and lung infiltrate on CT Left lower lobe pneumonia seen on CT abdomen and pelvis Biliary drain and dialysis catheter which can also be potential sources of infection MRSA screen was negative, Vancomycin dc'd S/p cefepime Nephrology was following. Not able to tolerate dialysis multiple attempts, limited by severe hypotension. Intensivist consulted for pressors,s/p Levophed 06/22/2023   # History of Klebsiella bacteremia 04/15/2023 Received Elita Quick during HD for 6 weeks.  Last received 12/3,  ID was consulted   # Gastric mass Previous imaging as well as current CT scan of the abdomen showing findings of gastric mass Gastroenterologist was consulted, have signed off Patient underwent colonoscopy/EGD in June 2024 and gastric lesion was thought to be due to GIST. Gastroenterologist planning for outpatient follow-up with EUS and biopsies once clinically stable. Follow-up at [facility] where they have endoscopic ultrasound capabilities if able to leave the hospital.   # Chronic hypotension: Presented in septic shock. PCCM consulted, Levophed, maintain MAP > 65. S/p management as above   # Acute metabolic encephalopathy Secondary to sepsis. CT head nonacute. Diet resumed Continue fall and aspiration precautions # Indwelling percutaneous cholecystostomy drain present # History of cholecystitis, not a surgical candidate Percutaneous drain bag, draining well, with thick yellowish-green sludgy material Last exchanged 05/06/2023. Potential source of infection.  Blood culture NGTD.  Fluid culture from bile grew multiple organisms   # History of GI bleed # Gastric varices, with history of bleed # Gist on EGD 12/2022: Bleeding not suspected # Paroxysmal A-fib: S/p amiodarone. 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  Nutrition Problem: Severe Malnutrition Etiology: chronic illness Interventions:   Nutrition Documentation    Flowsheet Row ED to Hosp-Admission (Discharged) from [facility]/19/2023 in [facility] 1C MEDICAL TELEMETRY  Nutrition Problem Severe Malnutrition  Etiology chronic illness  Nutrition Goal Patient will meet greater than or equal to 90% of their needs     ,  Active Pressure Injury/Wound(s)     Pressure Ulcer  Duration          Pressure Injury 06/20/23 Sacrum Mid Stage 2 -  Partial thickness loss of dermis presenting as a shallow open injury with a red, pink wound bed without slough. tiny 1/2 cm x 1/2 cm x 2 wounds. 8 days   Pressure Injury 06/21/23 Heel Left;Posterior Unstageable - Full thickness tissue loss in which the base of the injury is covered by slough (yellow, tan, gray, green or brown) and/or eschar (tan, brown or black) in the wound bed. 8 days   Pressure Injury 06/21/23 Heel Right Unstageable - Full thickness tissue loss in which the base of the injury is covered by slough (yellow, tan, gray, green or brown) and/or eschar (tan, brown or black) in the wound bed. 8 days            Procedures: None  Consultations: GI, pulmonary critical care  The results of significant diagnostics from this hospitalization (including imaging, microbiology, ancillary and laboratory) are listed below for reference.   Significant Diagnostic Studies: CT ABDOMEN PELVIS W CONTRAST Result Date: 06/19/2023 CLINICAL DATA:  Lower abdominal pain EXAM: CT ABDOMEN AND PELVIS WITH CONTRAST TECHNIQUE: Multidetector CT imaging of the abdomen and pelvis was performed using the standard protocol following  bolus administration of intravenous contrast. RADIATION DOSE REDUCTION: This exam was performed according to the departmental dose-optimization program which includes automated exposure  control, adjustment of the mA and/or kV according to patient size and/or use of iterative reconstruction technique. CONTRAST:  OMNIPAQUE IOHEXOL 300 MG/ML  SOLN COMPARISON:  03/29/2023 FINDINGS: Lower chest: Patchy left lower lobe opacity, suspicious for pneumonia, incompletely visualized. Trace left pleural fluid. Hepatobiliary: Liver is within normal limits. Cholelithiasis with indwelling percutaneous cholecystostomy. No intrahepatic or extrahepatic ductal dilatation. Pancreas: Within normal limits. Spleen: Within normal limits. Adrenals/Urinary Tract: Adrenal glands are within normal limits. Bilateral renal cortical atrophy with numerous cysts measuring up to 18 mm in the right lower kidney (series 2/image 35), benign (Bosniak I). No hydronephrosis. Bladder is underdistended but grossly unremarkable. Stomach/Bowel: 3.9 x 3.4 cm mass in the gastric cardia (series 2/image 16). Reportedly, the patient has known gastric varices, although the lesion favors a true submucosal mass on the current study. No evidence of bowel obstruction. Appendix is not discretely visualized. No colonic wall thickening or inflammatory changes. Vascular/Lymphatic: No evidence of abdominal aortic aneurysm. Atherosclerotic calcifications of the abdominal aorta and branch vessels, although vessels remain patent. No suspicious abdominopelvic lymphadenopathy. Reproductive: Fiducial markers along the prostate. Other: No abdominopelvic ascites. Mild presacral fluid, likely reflecting radiation changes. Musculoskeletal: Degenerative changes of the visualized thoracolumbar spine, most prominent at L3-4. IMPRESSION: 3.9 cm mass in the gastric cardia, suspicious for neoplasm. Endoscopy is suggested for further evaluation. Cholelithiasis with indwelling percutaneous cholecystostomy. Left lower lobe pneumonia, incompletely visualized. Trace left pleural fluid. Additional ancillary findings as above. Electronically Signed   By: Charline Bills  M.D.   On: 06/19/2023 23:02   CT Head Wo Contrast Result Date: 06/19/2023 CLINICAL DATA:  Altered mental status EXAM: CT HEAD WITHOUT CONTRAST TECHNIQUE: Contiguous axial images were obtained from the base of the skull through the vertex without intravenous contrast. RADIATION DOSE REDUCTION: This exam was performed according to the departmental dose-optimization program which includes automated exposure control, adjustment of the mA and/or kV according to patient size and/or use of iterative reconstruction technique. COMPARISON:  04/12/2023 FINDINGS: Brain: There is atrophy and chronic small vessel disease changes. No acute intracranial abnormality. Specifically, no hemorrhage, hydrocephalus, mass lesion, acute infarction, or significant intracranial injury. Vascular: No hyperdense vessel or unexpected calcification. Skull: No acute calvarial abnormality. Sinuses/Orbits: No acute findings Other: None IMPRESSION: Atrophy, chronic microvascular disease. No acute intracranial abnormality. Electronically Signed   By: Charlett Nose M.D.   On: 06/19/2023 19:49   DG Chest Port 1 View Result Date: 06/19/2023 CLINICAL DATA:  Sepsis EXAM: PORTABLE CHEST 1 VIEW COMPARISON:  04/12/2023 FINDINGS: Lungs are clear.  No pleural effusion or pneumothorax. The heart is normal in size.  Thoracic aortic atherosclerosis. Left IJ dual lumen dialysis catheter terminating in the right atrium. IMPRESSION: No acute cardiopulmonary disease. Electronically Signed   By: Charline Bills M.D.   On: 06/19/2023 19:36    Microbiology: Recent Results (from the past 240 hours)  Blood Culture (routine x 2)     Status: None   Collection Time: 06/19/23  6:57 PM   Specimen: BLOOD  Result Value Ref Range Status   Specimen Description BLOOD RIGHT [facility]  Final   Special Requests   Final    BOTTLES DRAWN AEROBIC AND ANAEROBIC Blood Culture results may not be optimal due to an inadequate volume of blood received in culture bottles   Culture    Final    NO GROWTH 5 DAYS Performed  at [facility], [address] [place], [place] [address]    Report Status 06/24/2023 FINAL  Final  Blood Culture (routine x 2)     Status: None   Collection Time: 06/19/23  9:05 PM   Specimen: BLOOD  Result Value Ref Range Status   Specimen Description BLOOD RIGTH FA  Final   Special Requests   Final    BOTTLES DRAWN AEROBIC AND ANAEROBIC Blood Culture adequate volume   Culture   Final    NO GROWTH 5 DAYS Performed at [facility], [address], [place], [place] [address]    Report Status 06/24/2023 FINAL  Final  MRSA Next Gen by PCR, Nasal     Status: None   Collection Time: 06/20/23  7:55 PM   Specimen: Nasal Mucosa; Nasal Swab  Result Value Ref Range Status   MRSA by PCR Next Gen NOT DETECTED NOT DETECTED Final    Comment: (NOTE) The GeneXpert MRSA Assay (FDA approved for NASAL specimens only), is one component of a comprehensive MRSA colonization surveillance program. It is not intended to diagnose MRSA infection nor to guide or monitor treatment for MRSA infections. Test performance is not FDA approved in patients less than 89 years old. Performed at [facility], [address] [place], [place] [address]   Body fluid culture w Gram Stain     Status: Abnormal   Collection Time: 06/21/23  4:45 AM   Specimen: BILE; Body Fluid  Result Value Ref Range Status   Specimen Description   Final    BILE Performed at [facility], [address], [place], [place] [address]    Special Requests   Final    NONE Performed at [facility], [address] [place], [place] [address]    Gram Stain NO WBC SEEN FEW GRAM NEGATIVE RODS   Final   Culture (A)  Final    MULTIPLE ORGANISMS PRESENT, NONE PREDOMINANT NO STAPHYLOCOCCUS AUREUS ISOLATED NO GROUP A STREP (S.PYOGENES) ISOLATED No Pseudomonas species isolated Performed at [facility], [address], [place], [place] [address]     Report Status 06/23/2023 FINAL  Final    Time spent: zero  minutes  Signed: Gillis Santa, MD 07/11/2023

## 2023-07-14 DEATH — deceased

## 2023-07-15 ENCOUNTER — Ambulatory Visit: Payer: Medicare Other | Admitting: Internal Medicine
